# Patient Record
Sex: Female | Born: 1945
Health system: Southern US, Community
[De-identification: ages and names within clinical notes are randomized; demographics above are authoritative.]

## PROBLEM LIST (undated history)

## (undated) DIAGNOSIS — I119 Hypertensive heart disease without heart failure: Secondary | ICD-10-CM

## (undated) DIAGNOSIS — E60 Dietary zinc deficiency: Secondary | ICD-10-CM

## (undated) DIAGNOSIS — R002 Palpitations: Secondary | ICD-10-CM

## (undated) DIAGNOSIS — M1990 Unspecified osteoarthritis, unspecified site: Secondary | ICD-10-CM

## (undated) DIAGNOSIS — K5900 Constipation, unspecified: Secondary | ICD-10-CM

## (undated) DIAGNOSIS — L578 Other skin changes due to chronic exposure to nonionizing radiation: Secondary | ICD-10-CM

## (undated) DIAGNOSIS — I214 Non-ST elevation (NSTEMI) myocardial infarction: Secondary | ICD-10-CM

## (undated) DIAGNOSIS — D126 Benign neoplasm of colon, unspecified: Secondary | ICD-10-CM

## (undated) DIAGNOSIS — K219 Gastro-esophageal reflux disease without esophagitis: Secondary | ICD-10-CM

## (undated) DIAGNOSIS — E209 Hypoparathyroidism, unspecified: Secondary | ICD-10-CM

## (undated) DIAGNOSIS — E785 Hyperlipidemia, unspecified: Secondary | ICD-10-CM

## (undated) DIAGNOSIS — H8110 Benign paroxysmal vertigo, unspecified ear: Secondary | ICD-10-CM

## (undated) DIAGNOSIS — M549 Dorsalgia, unspecified: Secondary | ICD-10-CM

## (undated) DIAGNOSIS — E890 Postprocedural hypothyroidism: Secondary | ICD-10-CM

## (undated) DIAGNOSIS — L989 Disorder of the skin and subcutaneous tissue, unspecified: Secondary | ICD-10-CM

## (undated) DIAGNOSIS — K529 Noninfective gastroenteritis and colitis, unspecified: Secondary | ICD-10-CM

## (undated) DIAGNOSIS — M79621 Pain in right upper arm: Secondary | ICD-10-CM

## (undated) DIAGNOSIS — I10 Essential (primary) hypertension: Secondary | ICD-10-CM

## (undated) DIAGNOSIS — R351 Nocturia: Secondary | ICD-10-CM

## (undated) DIAGNOSIS — R42 Dizziness and giddiness: Secondary | ICD-10-CM

## (undated) DIAGNOSIS — M542 Cervicalgia: Secondary | ICD-10-CM

## (undated) DIAGNOSIS — I2510 Atherosclerotic heart disease of native coronary artery without angina pectoris: Secondary | ICD-10-CM

## (undated) DIAGNOSIS — E042 Nontoxic multinodular goiter: Secondary | ICD-10-CM

## (undated) HISTORY — DX: Noninfective gastroenteritis and colitis, unspecified: K52.9

## (undated) HISTORY — DX: Hypoparathyroidism, unspecified: E20.9

## (undated) HISTORY — DX: Gastro-esophageal reflux disease without esophagitis: K21.9

## (undated) HISTORY — DX: Dietary zinc deficiency: E60

## (undated) HISTORY — DX: Nocturia: R35.1

## (undated) HISTORY — DX: Other skin changes due to chronic exposure to nonionizing radiation: L57.8

## (undated) HISTORY — PX: CORONARY ANGIOPLASTY WITH STENT PLACEMENT: SHX49

## (undated) HISTORY — DX: Dizziness and giddiness: R42

## (undated) HISTORY — PX: POLYPECTOMY: SHX149

## (undated) HISTORY — PX: SHOULDER ARTHROSCOPY W/ ROTATOR CUFF REPAIR: SHX2400

## (undated) HISTORY — DX: Postprocedural hypothyroidism: E89.0

## (undated) HISTORY — DX: Dorsalgia, unspecified: M54.9

## (undated) HISTORY — DX: Unspecified osteoarthritis, unspecified site: M19.90

## (undated) HISTORY — DX: Cervicalgia: M54.2

## (undated) HISTORY — DX: Benign paroxysmal vertigo, unspecified ear: H81.10

## (undated) HISTORY — DX: Essential (primary) hypertension: I10

## (undated) HISTORY — DX: Hyperlipidemia, unspecified: E78.5

## (undated) HISTORY — DX: Benign neoplasm of colon, unspecified: D12.6

## (undated) HISTORY — PX: COLONOSCOPY: SHX174

## (undated) HISTORY — DX: Pain in right upper arm: M79.621

## (undated) HISTORY — DX: Disorder of the skin and subcutaneous tissue, unspecified: L98.9

## (undated) HISTORY — DX: Constipation, unspecified: K59.00

---

## 2000-11-11 ENCOUNTER — Other Ambulatory Visit: Admission: RE | Admit: 2000-11-11 | Discharge: 2000-11-11 | Payer: Self-pay | Admitting: *Deleted

## 2001-12-21 ENCOUNTER — Emergency Department (HOSPITAL_COMMUNITY): Admission: EM | Admit: 2001-12-21 | Discharge: 2001-12-21 | Payer: Self-pay | Admitting: Emergency Medicine

## 2001-12-21 ENCOUNTER — Encounter: Payer: Self-pay | Admitting: Emergency Medicine

## 2002-03-17 ENCOUNTER — Other Ambulatory Visit: Admission: RE | Admit: 2002-03-17 | Discharge: 2002-03-17 | Payer: Self-pay | Admitting: Internal Medicine

## 2002-04-08 ENCOUNTER — Ambulatory Visit (HOSPITAL_COMMUNITY): Admission: RE | Admit: 2002-04-08 | Discharge: 2002-04-08 | Payer: Self-pay | Admitting: Internal Medicine

## 2002-04-08 ENCOUNTER — Encounter: Payer: Self-pay | Admitting: Internal Medicine

## 2003-04-21 ENCOUNTER — Encounter: Payer: Self-pay | Admitting: Internal Medicine

## 2003-04-21 ENCOUNTER — Ambulatory Visit (HOSPITAL_COMMUNITY): Admission: RE | Admit: 2003-04-21 | Discharge: 2003-04-21 | Payer: Self-pay | Admitting: Internal Medicine

## 2003-09-01 ENCOUNTER — Encounter: Admission: RE | Admit: 2003-09-01 | Discharge: 2003-09-01 | Payer: Self-pay | Admitting: Internal Medicine

## 2003-09-01 ENCOUNTER — Encounter: Payer: Self-pay | Admitting: Internal Medicine

## 2004-03-06 ENCOUNTER — Observation Stay (HOSPITAL_COMMUNITY): Admission: EM | Admit: 2004-03-06 | Discharge: 2004-03-07 | Payer: Self-pay

## 2004-04-05 ENCOUNTER — Other Ambulatory Visit: Admission: RE | Admit: 2004-04-05 | Discharge: 2004-04-05 | Payer: Self-pay | Admitting: Internal Medicine

## 2005-04-24 ENCOUNTER — Ambulatory Visit: Payer: Self-pay | Admitting: Family Medicine

## 2005-04-24 ENCOUNTER — Ambulatory Visit: Payer: Self-pay | Admitting: *Deleted

## 2005-08-09 ENCOUNTER — Ambulatory Visit (HOSPITAL_COMMUNITY): Admission: RE | Admit: 2005-08-09 | Discharge: 2005-08-09 | Payer: Self-pay | Admitting: Internal Medicine

## 2005-08-30 ENCOUNTER — Ambulatory Visit: Payer: Self-pay | Admitting: Family Medicine

## 2005-09-12 ENCOUNTER — Other Ambulatory Visit: Admission: RE | Admit: 2005-09-12 | Discharge: 2005-09-12 | Payer: Self-pay | Admitting: Gynecology

## 2005-12-24 HISTORY — PX: TOTAL THYROIDECTOMY: SHX2547

## 2006-06-05 ENCOUNTER — Ambulatory Visit: Payer: Self-pay | Admitting: *Deleted

## 2006-06-06 ENCOUNTER — Ambulatory Visit: Payer: Self-pay | Admitting: *Deleted

## 2006-06-17 ENCOUNTER — Ambulatory Visit: Payer: Self-pay | Admitting: Endocrinology

## 2006-06-21 ENCOUNTER — Ambulatory Visit (HOSPITAL_COMMUNITY): Admission: RE | Admit: 2006-06-21 | Discharge: 2006-06-21 | Payer: Self-pay | Admitting: Orthopedic Surgery

## 2006-07-03 ENCOUNTER — Ambulatory Visit: Payer: Self-pay | Admitting: Endocrinology

## 2006-07-09 ENCOUNTER — Other Ambulatory Visit: Admission: RE | Admit: 2006-07-09 | Discharge: 2006-07-09 | Payer: Self-pay | Admitting: Endocrinology

## 2006-07-09 ENCOUNTER — Encounter: Payer: Self-pay | Admitting: Endocrinology

## 2006-07-09 ENCOUNTER — Ambulatory Visit: Payer: Self-pay | Admitting: Endocrinology

## 2006-07-19 ENCOUNTER — Ambulatory Visit (HOSPITAL_COMMUNITY): Admission: RE | Admit: 2006-07-19 | Discharge: 2006-07-19 | Payer: Self-pay | Admitting: Endocrinology

## 2006-07-31 ENCOUNTER — Other Ambulatory Visit: Admission: RE | Admit: 2006-07-31 | Discharge: 2006-07-31 | Payer: Self-pay | Admitting: Endocrinology

## 2006-07-31 ENCOUNTER — Ambulatory Visit: Payer: Self-pay | Admitting: Endocrinology

## 2006-08-01 ENCOUNTER — Encounter (INDEPENDENT_AMBULATORY_CARE_PROVIDER_SITE_OTHER): Payer: Self-pay | Admitting: Specialist

## 2006-08-22 ENCOUNTER — Ambulatory Visit: Payer: Self-pay | Admitting: Endocrinology

## 2006-08-30 ENCOUNTER — Ambulatory Visit: Payer: Self-pay

## 2006-09-02 ENCOUNTER — Ambulatory Visit: Payer: Self-pay | Admitting: *Deleted

## 2006-09-18 ENCOUNTER — Ambulatory Visit (HOSPITAL_COMMUNITY): Admission: RE | Admit: 2006-09-18 | Discharge: 2006-09-18 | Payer: Self-pay | Admitting: Endocrinology

## 2006-09-18 ENCOUNTER — Ambulatory Visit: Payer: Self-pay | Admitting: Endocrinology

## 2006-10-21 ENCOUNTER — Ambulatory Visit (HOSPITAL_COMMUNITY): Admission: RE | Admit: 2006-10-21 | Discharge: 2006-10-24 | Payer: Self-pay | Admitting: General Surgery

## 2006-10-21 ENCOUNTER — Encounter (INDEPENDENT_AMBULATORY_CARE_PROVIDER_SITE_OTHER): Payer: Self-pay | Admitting: *Deleted

## 2006-10-30 ENCOUNTER — Ambulatory Visit: Payer: Self-pay | Admitting: Endocrinology

## 2006-12-30 ENCOUNTER — Ambulatory Visit: Payer: Self-pay | Admitting: Endocrinology

## 2007-02-03 ENCOUNTER — Ambulatory Visit: Payer: Self-pay | Admitting: Endocrinology

## 2007-02-03 LAB — CONVERTED CEMR LAB: Calcium, Total (PTH): 8.3 mg/dL — ABNORMAL LOW (ref 8.4–10.5)

## 2007-02-14 ENCOUNTER — Ambulatory Visit: Payer: Self-pay | Admitting: Internal Medicine

## 2007-02-25 ENCOUNTER — Ambulatory Visit: Payer: Self-pay | Admitting: Endocrinology

## 2007-03-24 ENCOUNTER — Ambulatory Visit: Payer: Self-pay | Admitting: Endocrinology

## 2007-03-24 LAB — CONVERTED CEMR LAB
PTH: <2.5 pg/mL — ABNORMAL LOW (ref 14.0–72.0)
TSH: 0.15 microintl units/mL — ABNORMAL LOW (ref 0.35–5.50)

## 2007-03-27 ENCOUNTER — Ambulatory Visit: Payer: Self-pay | Admitting: Endocrinology

## 2007-05-02 ENCOUNTER — Inpatient Hospital Stay (HOSPITAL_COMMUNITY): Admission: EM | Admit: 2007-05-02 | Discharge: 2007-05-04 | Payer: Self-pay | Admitting: Emergency Medicine

## 2007-05-03 ENCOUNTER — Ambulatory Visit: Payer: Self-pay | Admitting: Internal Medicine

## 2007-05-07 ENCOUNTER — Ambulatory Visit: Payer: Self-pay | Admitting: Internal Medicine

## 2007-05-07 LAB — CONVERTED CEMR LAB
BUN: 11 mg/dL (ref 6–23)
Calcium: 8.2 mg/dL — ABNORMAL LOW (ref 8.4–10.5)
GFR calc Af Amer: 110 mL/min
GFR calc non Af Amer: 91 mL/min
Glucose, Bld: 95 mg/dL (ref 70–99)
Potassium: 3.6 meq/L (ref 3.5–5.1)

## 2007-05-08 ENCOUNTER — Ambulatory Visit: Payer: Self-pay | Admitting: Endocrinology

## 2007-06-02 ENCOUNTER — Ambulatory Visit: Payer: Self-pay | Admitting: Endocrinology

## 2007-06-02 LAB — CONVERTED CEMR LAB
Calcium, Total (PTH): 7.8 mg/dL — ABNORMAL LOW (ref 8.4–10.5)
PTH: <2.5 pg/mL — ABNORMAL LOW (ref 14.0–72.0)
Potassium: 4.6 meq/L (ref 3.5–5.1)
Potassium: 4.6 meq/L (ref 3.5–5.1)
TSH: 5.52 microintl units/mL — ABNORMAL HIGH (ref 0.35–5.50)

## 2007-06-12 ENCOUNTER — Ambulatory Visit: Payer: Self-pay | Admitting: Endocrinology

## 2007-10-20 ENCOUNTER — Encounter: Admission: RE | Admit: 2007-10-20 | Discharge: 2007-10-20 | Payer: Self-pay | Admitting: Endocrinology

## 2008-01-12 ENCOUNTER — Ambulatory Visit: Payer: Self-pay | Admitting: Internal Medicine

## 2008-01-20 ENCOUNTER — Other Ambulatory Visit: Admission: RE | Admit: 2008-01-20 | Discharge: 2008-01-20 | Payer: Self-pay | Admitting: Gynecology

## 2008-04-03 ENCOUNTER — Emergency Department (HOSPITAL_COMMUNITY): Admission: EM | Admit: 2008-04-03 | Discharge: 2008-04-03 | Payer: Self-pay | Admitting: Emergency Medicine

## 2008-04-12 ENCOUNTER — Emergency Department (HOSPITAL_COMMUNITY): Admission: EM | Admit: 2008-04-12 | Discharge: 2008-04-12 | Payer: Self-pay | Admitting: Emergency Medicine

## 2008-06-28 ENCOUNTER — Ambulatory Visit: Payer: Self-pay | Admitting: Internal Medicine

## 2008-07-12 ENCOUNTER — Ambulatory Visit (HOSPITAL_COMMUNITY): Admission: RE | Admit: 2008-07-12 | Discharge: 2008-07-12 | Payer: Self-pay | Admitting: Internal Medicine

## 2008-09-06 ENCOUNTER — Ambulatory Visit: Payer: Self-pay | Admitting: Internal Medicine

## 2008-09-06 LAB — CONVERTED CEMR LAB: AST: 18 units/L (ref 0–37)

## 2008-09-30 ENCOUNTER — Encounter: Admission: RE | Admit: 2008-09-30 | Discharge: 2008-10-27 | Payer: Self-pay | Admitting: Internal Medicine

## 2008-10-21 ENCOUNTER — Encounter: Admission: RE | Admit: 2008-10-21 | Discharge: 2008-10-21 | Payer: Self-pay | Admitting: Internal Medicine

## 2008-10-31 ENCOUNTER — Observation Stay (HOSPITAL_COMMUNITY): Admission: EM | Admit: 2008-10-31 | Discharge: 2008-11-01 | Payer: Self-pay | Admitting: Emergency Medicine

## 2008-10-31 ENCOUNTER — Ambulatory Visit: Payer: Self-pay | Admitting: Cardiology

## 2008-11-04 ENCOUNTER — Encounter: Payer: Self-pay | Admitting: Cardiology

## 2008-11-04 ENCOUNTER — Encounter: Payer: Self-pay | Admitting: Internal Medicine

## 2008-11-04 ENCOUNTER — Ambulatory Visit: Payer: Self-pay

## 2008-11-22 ENCOUNTER — Ambulatory Visit: Payer: Self-pay | Admitting: Internal Medicine

## 2009-01-20 ENCOUNTER — Other Ambulatory Visit: Admission: RE | Admit: 2009-01-20 | Discharge: 2009-01-20 | Payer: Self-pay | Admitting: Gynecology

## 2009-01-20 ENCOUNTER — Ambulatory Visit: Payer: Self-pay | Admitting: Gynecology

## 2009-01-20 ENCOUNTER — Encounter: Payer: Self-pay | Admitting: Gynecology

## 2009-03-02 ENCOUNTER — Encounter: Payer: Self-pay | Admitting: Gynecology

## 2009-03-02 ENCOUNTER — Ambulatory Visit: Payer: Self-pay | Admitting: Gynecology

## 2009-03-08 DIAGNOSIS — I2510 Atherosclerotic heart disease of native coronary artery without angina pectoris: Secondary | ICD-10-CM

## 2009-03-08 DIAGNOSIS — E785 Hyperlipidemia, unspecified: Secondary | ICD-10-CM | POA: Insufficient documentation

## 2009-03-10 ENCOUNTER — Encounter: Payer: Self-pay | Admitting: Internal Medicine

## 2009-03-10 ENCOUNTER — Ambulatory Visit: Payer: Self-pay | Admitting: Internal Medicine

## 2009-03-10 LAB — CONVERTED CEMR LAB
BUN: 20 mg/dL (ref 6–23)
CO2: 34 meq/L — ABNORMAL HIGH (ref 19–32)
Chloride: 104 meq/L (ref 96–112)
Creatinine, Ser: 1.3 mg/dL — ABNORMAL HIGH (ref 0.4–1.2)
Glucose, Bld: 98 mg/dL (ref 70–99)
Potassium: 4.3 meq/L (ref 3.5–5.1)

## 2009-03-11 ENCOUNTER — Ambulatory Visit: Payer: Self-pay | Admitting: Gynecology

## 2009-04-07 ENCOUNTER — Telehealth (INDEPENDENT_AMBULATORY_CARE_PROVIDER_SITE_OTHER): Payer: Self-pay | Admitting: *Deleted

## 2009-04-08 ENCOUNTER — Telehealth (INDEPENDENT_AMBULATORY_CARE_PROVIDER_SITE_OTHER): Payer: Self-pay | Admitting: *Deleted

## 2009-05-20 ENCOUNTER — Encounter: Payer: Self-pay | Admitting: Cardiovascular Disease

## 2009-05-20 ENCOUNTER — Ambulatory Visit: Payer: Self-pay | Admitting: Internal Medicine

## 2009-05-20 DIAGNOSIS — I10 Essential (primary) hypertension: Secondary | ICD-10-CM

## 2009-05-20 DIAGNOSIS — E039 Hypothyroidism, unspecified: Secondary | ICD-10-CM | POA: Insufficient documentation

## 2009-05-25 LAB — CONVERTED CEMR LAB
AST: 16 units/L (ref 0–37)
Cholesterol: 289 mg/dL — ABNORMAL HIGH (ref 0–200)
Direct LDL: 212.1 mg/dL
Total CHOL/HDL Ratio: 7
VLDL: 37.8 mg/dL (ref 0.0–40.0)

## 2009-10-03 ENCOUNTER — Encounter: Payer: Self-pay | Admitting: Endocrinology

## 2010-01-17 ENCOUNTER — Encounter (INDEPENDENT_AMBULATORY_CARE_PROVIDER_SITE_OTHER): Payer: Self-pay | Admitting: *Deleted

## 2010-04-17 ENCOUNTER — Ambulatory Visit: Payer: Self-pay | Admitting: Gynecology

## 2010-04-17 ENCOUNTER — Other Ambulatory Visit: Admission: RE | Admit: 2010-04-17 | Discharge: 2010-04-17 | Payer: Self-pay | Admitting: Gynecology

## 2010-04-25 ENCOUNTER — Ambulatory Visit: Payer: Self-pay | Admitting: Gynecology

## 2010-05-18 ENCOUNTER — Encounter: Payer: Self-pay | Admitting: Endocrinology

## 2010-05-31 ENCOUNTER — Ambulatory Visit: Payer: Self-pay | Admitting: Gynecology

## 2010-06-27 ENCOUNTER — Encounter: Payer: Self-pay | Admitting: Endocrinology

## 2010-10-20 ENCOUNTER — Ambulatory Visit: Payer: Self-pay | Admitting: Internal Medicine

## 2010-10-20 ENCOUNTER — Encounter: Payer: Self-pay | Admitting: Internal Medicine

## 2010-10-20 LAB — CONVERTED CEMR LAB
AST: 21 units/L (ref 0–37)
Cholesterol: 201 mg/dL — ABNORMAL HIGH (ref 0–200)
Direct LDL: 130.2 mg/dL

## 2010-12-22 ENCOUNTER — Ambulatory Visit: Payer: Self-pay | Admitting: Internal Medicine

## 2011-01-14 ENCOUNTER — Encounter: Payer: Self-pay | Admitting: Internal Medicine

## 2011-01-23 NOTE — Letter (Signed)
Summary: Appointment - Reminder 2  [employer], Main Office  [address]   [place], [place] [address]   Phone: [phone]  Fax: [phone]     January 17, 2010 MRN: [id number]   [name] [address] [place], [place]  [address]   Dear Ms. Calderone,  Our records indicate that it is time to schedule a follow-up appointment with Dr. [name]. It is very important that we reach you to schedule this appointment. We look forward to participating in your health care needs. Please contact us at the number listed above at your earliest convenience to schedule your appointment.  If you are unable to make an appointment at this time, give [place] a call so we can update our records.  Sincerely,   Migdalia Dk [facility] Scheduling Team

## 2011-01-23 NOTE — Assessment & Plan Note (Signed)
Summary: rov. gd  Medications Added LIPITOR 40 MG TABS (ATORVASTATIN CALCIUM) 1 tab every day LIPITOR 40 MG TABS (ATORVASTATIN CALCIUM) 2 tabs  every day      Allergies Added: NKDA  Visit Type:  Follow-up Primary Provider:  none   History of Present Illness: patient is a 65 year old with a history of hypertension, nonobstructive CAD and dyslipidemia.  I last saw her in the Spring of 2010.  She did not f/u with lipids after that Since seen she has done fairly well.  She notes occasional pinching in her chest that is not associated with activithy.  Lasts a very short time.  Occurs 1x per wk.  Not associated with shortness of breath.  Current Medications (verified): 1)  Calcitriol 0.25 Mcg .... 2 Pills Bid 2)  Synthroid 112 Mcg Tabs (Levothyroxine Sodium) .Marland Kitchen.. 1 Tab Once Daily 3)  Extra St Tums 750 Mg .Marland Kitchen.. 8 Per Day 4)  Aspirin 81 Mg Tbec (Aspirin) .... Take One Tablet By Mouth Daily 5)  Amlodipine Besylate 10 Mg Tabs (Amlodipine Besylate) .Marland Kitchen.. 1 Tab Once Daily  Allergies (verified): No Known Drug Allergies  Past History:  Past medical, surgical, family and social histories (including risk factors) reviewed, and no changes noted (except as noted below).  Past Medical History: Reviewed history from 03/08/2009 and no changes required. dyslipidemia nonobstructive CAD  Family History: Reviewed history from 03/08/2009 and no changes required. Mother died at 97 of old age and Parkinson's.  Father   died at 25 of an MI.  She has 9 brothers and sisters.  There is a   history of CAD and 1 brother.  There is no history of diabetes or CVA   and any of her siblings.      Social History: Reviewed history from 03/08/2009 and no changes required. She lives in [place] with a friend who is the father   of her son.  She works at [employer].  She denies tobacco, alcohol, or drug   use.  She walks 30 minutes 3 times a week.      Review of Systems       ALl systems reviewed.  Neg to the  above problem  Vital Signs:  Patient profile:   65 year old female Height:      64 inches Weight:      198 pounds BMI:     32.90 BP sitting:   122 / 72  (left arm)  Vitals Entered By: Laurance Flatten CMA (October 20, 2010 11:46 AM)  Physical Exam  Additional Exam:  patinet is in NAD HEENT:  Normocephalic, atraumatic. EOMI, PERRLA.  Neck: JVP is normal. No thyromegaly. No bruits.  Lungs: clear to auscultation. No rales no wheezes.  Heart: Regular rate and rhythm. Normal S1, S2. No S3.   No significant murmurs. PMI not displaced.  Abdomen:  Supple, nontender. Normal bowel sounds. No masses. No hepatomegaly.  Extremities:   Good distal pulses throughout. No lower extremity edema.  Musculoskeletal :moving all extremities.  Neuro:   alert and oriented x3.    EKG  Procedure date:  10/20/2010  Findings:      SInus bradycardia 59 bpm.  Impression & Recommendations:  Problem # 1:  ESSENTIAL HYPERTENSION, BENIGN (ICD-401.1) Good control.  Continue meds. Her updated medication list for this problem includes:    Aspirin 81 Mg Tbec (Aspirin) .Marland Kitchen... Take one tablet by mouth daily    Amlodipine Besylate 10 Mg Tabs (Amlodipine besylate) .Marland Kitchen... 1 tab once daily  Problem # 2:  HYPERLIPIDEMIA-MIXED (ICD-272.4) Profound in past.  Says she is taking Lipitor 40 mg.  Willl check fasting lipids and AST.  Will also check TSH.  Problem # 3:  CAD, NATIVE VESSEL (ICD-414.01) Continue meds. Her updated medication list for this problem includes:    Aspirin 81 Mg Tbec (Aspirin) .Marland Kitchen... Take one tablet by mouth daily    Amlodipine Besylate 10 Mg Tabs (Amlodipine besylate) .Marland Kitchen... 1 tab once daily  Orders: EKG w/ Interpretation (93000)  Other Orders: TLB-AST (SGOT) (84450-SGOT) TLB-Lipid Panel (80061-LIPID) TLB-TSH (Thyroid Stimulating Hormone) (02542-HCW)  Patient Instructions: 1)  Your physician recommends that you return for lab work in: lab work today we will call you with results 2)  Your  physician wants you to follow-up in:12 months   You will receive a reminder letter in the mail two months in advance. If you don't receive a letter, please call our office to schedule the follow-up appointment.   Appended Document: rov. gd Chest pain does not sound cardiac.

## 2011-01-23 NOTE — Letter (Signed)
Summary: [facility]  [facility]   Imported By: Sherian Rein 07/03/2010 13:51:55  _____________________________________________________________________  External Attachment:    Type:   Image     Comment:   External Document

## 2011-01-23 NOTE — Letter (Signed)
Summary: [facility]  [facility]   Imported By: Sherian Rein 07/06/2010 08:50:16  _____________________________________________________________________  External Attachment:    Type:   Image     Comment:   External Document

## 2011-01-30 ENCOUNTER — Other Ambulatory Visit: Payer: Self-pay | Admitting: Gynecology

## 2011-01-30 DIAGNOSIS — Z0000 Encounter for general adult medical examination without abnormal findings: Secondary | ICD-10-CM

## 2011-02-01 ENCOUNTER — Ambulatory Visit
Admission: RE | Admit: 2011-02-01 | Discharge: 2011-02-01 | Disposition: A | Discharge status: Home / Self Care | Payer: 59 | Source: Ambulatory Visit | Attending: Gynecology | Admitting: Gynecology

## 2011-02-01 DIAGNOSIS — Z0000 Encounter for general adult medical examination without abnormal findings: Secondary | ICD-10-CM

## 2011-04-23 ENCOUNTER — Other Ambulatory Visit (HOSPITAL_COMMUNITY): Payer: 59

## 2011-04-23 ENCOUNTER — Emergency Department (HOSPITAL_COMMUNITY)
Admission: EM | Admit: 2011-04-23 | Discharge: 2011-04-23 | Disposition: A | Discharge status: Home / Self Care | Payer: 59 | Attending: Emergency Medicine | Admitting: Emergency Medicine

## 2011-04-23 ENCOUNTER — Emergency Department (HOSPITAL_COMMUNITY): Payer: 59

## 2011-04-23 DIAGNOSIS — M129 Arthropathy, unspecified: Secondary | ICD-10-CM | POA: Insufficient documentation

## 2011-04-23 DIAGNOSIS — R61 Generalized hyperhidrosis: Secondary | ICD-10-CM | POA: Insufficient documentation

## 2011-04-23 DIAGNOSIS — M25569 Pain in unspecified knee: Secondary | ICD-10-CM | POA: Insufficient documentation

## 2011-04-23 DIAGNOSIS — C754 Malignant neoplasm of carotid body: Secondary | ICD-10-CM | POA: Insufficient documentation

## 2011-04-23 DIAGNOSIS — R110 Nausea: Secondary | ICD-10-CM | POA: Insufficient documentation

## 2011-04-23 DIAGNOSIS — R55 Syncope and collapse: Secondary | ICD-10-CM | POA: Insufficient documentation

## 2011-04-23 LAB — POCT I-STAT, CHEM 8
BUN: 29 mg/dL — ABNORMAL HIGH (ref 6–23)
Chloride: 103 mEq/L (ref 96–112)
Creatinine, Ser: 1.3 mg/dL — ABNORMAL HIGH (ref 0.4–1.2)
Potassium: 4 mEq/L (ref 3.5–5.1)
Sodium: 141 mEq/L (ref 135–145)

## 2011-04-23 LAB — DIFFERENTIAL
Basophils Relative: 0 % (ref 0–1)
Lymphocytes Relative: 21 % (ref 12–46)
Monocytes Absolute: 0.8 10*3/uL (ref 0.1–1.0)
Monocytes Relative: 8 % (ref 3–12)
Neutro Abs: 6.2 10*3/uL (ref 1.7–7.7)
Neutrophils Relative %: 67 % (ref 43–77)

## 2011-04-23 LAB — CBC
HCT: 36.7 % (ref 36.0–46.0)
Hemoglobin: 12.1 g/dL (ref 12.0–15.0)
MCH: 27.4 pg (ref 26.0–34.0)
MCHC: 33 g/dL (ref 30.0–36.0)
RBC: 4.42 MIL/uL (ref 3.87–5.11)

## 2011-04-23 LAB — SEDIMENTATION RATE: Sed Rate: 45 mm/hr — ABNORMAL HIGH (ref 0–22)

## 2011-04-23 MED ORDER — IOHEXOL 300 MG/ML  SOLN
5.0000 mL | Freq: Once | INTRAMUSCULAR | Status: AC | PRN
  Administered 2011-04-23: 5 mL

## 2011-04-25 ENCOUNTER — Other Ambulatory Visit (HOSPITAL_COMMUNITY)
Admission: RE | Admit: 2011-04-25 | Discharge: 2011-04-25 | Disposition: A | Discharge status: Home / Self Care | Payer: 59 | Source: Ambulatory Visit | Attending: Gynecology | Admitting: Gynecology

## 2011-04-25 ENCOUNTER — Encounter (INDEPENDENT_AMBULATORY_CARE_PROVIDER_SITE_OTHER): Payer: 59 | Admitting: Gynecology

## 2011-04-25 ENCOUNTER — Other Ambulatory Visit: Payer: Self-pay | Admitting: Gynecology

## 2011-04-25 DIAGNOSIS — Z1211 Encounter for screening for malignant neoplasm of colon: Secondary | ICD-10-CM

## 2011-04-25 DIAGNOSIS — Z01419 Encounter for gynecological examination (general) (routine) without abnormal findings: Secondary | ICD-10-CM

## 2011-04-25 DIAGNOSIS — E7800 Pure hypercholesterolemia, unspecified: Secondary | ICD-10-CM

## 2011-04-25 DIAGNOSIS — Z124 Encounter for screening for malignant neoplasm of cervix: Secondary | ICD-10-CM | POA: Insufficient documentation

## 2011-04-27 ENCOUNTER — Encounter: Payer: Self-pay | Admitting: Internal Medicine

## 2011-05-08 NOTE — Assessment & Plan Note (Signed)
[facility]                            CARDIOLOGY OFFICE NOTE   Taylor, Rice                     MRN:          [id number]  DATE:01/12/2008                            DOB:          08-Feb-1946    IDENTIFICATION:  [name] is a an 65 year old woman previously  followed by [name].  She was last seen by Dr. Corinda Rice back in 2007.   The patient has a history of profound dyslipidemia, nonobstructive CAD  (mild) by cath in February 2005; atypical chest pain, occasional  palpitations.   Since seen, she has undergone thyroidectomy complicated by  hypoparathyroidism and also postoperatively voice hoarseness.  She is  now followed at [facility].  The patient says she is taking  her medicines.  She still gets a couple times per month chest  discomfort.  It occurs usually when she is sitting.  There is no  association with activity nothing that makes the pain worse or better.  Eases on its own.  She has had for years   She works at [employer], and notes no change in her ability  to do things.   CURRENT MEDICATIONS:  1. Include Norvasc 10.  2. Aspirin 81.  3. Lipitor 20.  4. Calcitriol 1 mg daily.  5. Synthroid 125 mcg daily.  6. Hair, skin, and nail vitamin daily.  7. Tums 8 tablets daily.   PHYSICAL EXAM:  On exam the patient is in no distress.  Voice is hoarse.  Blood pressure 140/80, pulse is 61 and regular, weight 199.  Her lungs are clear.  NECK:  No JVD.  No bruits.  Cardiac exam regular rate and rhythm, S1-S2 no S3.  No significant  murmurs.  Abdomen is benign.  No masses.  No hepatomegaly.  EXTREMITIES:  Good distal pulses.  No lower extremity edema.  No bruits.   A 12-lead EKG normal sinus rhythm 61 beats per minute.   IMPRESSION:  1. Chest pain, atypical.  It could be gastrointestinal, although she      denies reflux symptoms.  I have told her to try Prilosec OTC and      see how she responds.  2.  Dyslipidemia.  Will need to get records from [facility], [address], and I will be in touch with her.  3. Palpitations.  No complaints today.  4. Hypertension.  Blood pressure on check in the office 140/80 may      have been a little higher at 153/79 on arrival.  Will need to be      followed.   I will set follow-up for 1 year, sooner if her symptoms change.  Again,  I will be in touch with her with the blood work results.     Pricilla Riffle, MD, [facility]  Electronically Signed    PVR/MedQ  DD: 01/12/2008  DT: 01/12/2008  Job #: [id number]   cc:   [facility] - [address]

## 2011-05-08 NOTE — H&P (Signed)
[name], [name] NO.:  [id number]   MEDICAL RECORD NO.:  [id number]          PATIENT TYPE:  INP   LOCATION:  3703                         FACILITY:  [facility]   PHYSICIAN:  Taylor Abed, MD, FACCDATE OF BIRTH:  04/26/1946   DATE OF ADMISSION:  10/31/2008  DATE OF DISCHARGE:                              HISTORY & PHYSICAL   PRIMARY CARDIOLOGIST:  Taylor Riffle, MD, [facility]   PRIMARY CARE [name]:  Taylor Frames, MD   THE PATIENT'S PROFILE:  A 65 year old female with prior history of chest  pain and nonobstructive CAD who presents with recurrent chest pain,  1. Chest pain/nonobstructive CAD.      a.     March 07, 2004 cardiac catheterization; EF 60%.  Left main       normal.  LAD 40% proximal.  Left circumflex normal.  RCA normal.      b.     Status post Myoview June 21, 2006, which was nonischemic.  2. Hypertension.  3. Hyperlipidemia.  4. Multinodular goiter.      a.     Status post thyroidectomy October 2007 with complicated by       postoperative hypocalcemia.  5. Hypothyroidism on Synthroid replacement.  6. Status post right shoulder surgery, June 2007.  7. History of palpitation.   HISTORY OF PRESENT ILLNESS:  A 65 year old female with prior history of  chest pain and nonobstructive CAD by cardiac catheterization 2005 who  was then in her usual state of health until this morning when she woke  at 6:30 a.m. with 5/10 sharp retrosternal chest pain without associated  symptoms or radiation.  She thought symptoms were initially worse with  upper body movement and activity such as getting dressed, but since  noted that has been present even at rest.  Symptoms are similar to the  chest pain that she have the time of her normal catheterization in 2005.  After a few hours of ongoing symptoms this morning, she presented to  [facility] in hopes of avoiding the ED; however, she was continuing to  have ongoing discomfort and they sent her to the ED for our  evaluation.  Of note, she did take two baby aspirin and a Prilosec at home without  relief.  Her ECG at [facility] showed no acute changes and currently she  appears comfortable, still complaining of forehead and then chest pain.   Notably, she experiences chest heaviness once every 3 months or so  lasting 2 hours and resolving spontaneously.  She exercises 3 times a  week without limitations in activities.  She has had chest pain  intermittently for several years.   ALLERGIES:  No known drug allergies.   HOME MEDICATIONS:  1. Aspirin 81 mg daily.  2. Norvasc 10 mg daily.  3. Lipitor 20 mg daily.  4. Calcitriol 0.25 mg 4 tablets b.i.d.  5. Synthroid 137 mcg daily.  6. Tums 8 tablets daily.   FAMILY HISTORY:  Mother died at 49 of old age and Parkinson's.  Father  died at 72 of an MI.  She has 9 brothers and sisters.  There is a  history of CAD and 1 brother.  There is no history of diabetes or CVA  and any of her siblings.   SOCIAL HISTORY:  She lives in [place] with a friend who is the father  of her son.  She works at [employer].  She denies tobacco, alcohol, or drug  use.  She walks 30 minutes 3 times a week.   REVIEW OF SYSTEMS:  Positive for occasional chills.  She has had chest  pain as outlined in the HPI.  Otherwise, all systems reviewed and  negative.   PHYSICAL EXAM:  VITAL SIGNS:  Heart rate 70, respirations 16, blood  pressure is 160/80, weight is 195 pounds.  GENERAL:  She is afebrile, pleasant female in no acute distress.  Awake,  alert, and oriented x3.  HEENT:  Normal.  NEUROLOGIC:  Grossly intact and nonfocal.  SKIN:  Warm and dry without lesions or masses.  MUSCULOSKELETAL:  Without obvious deformity or effusions.  NECK:  No bruits, JVD, or lymphadenopathy.  LUNGS:  Respirations regular and unlabored.  Clear to auscultation.  CARDIAC:  Regular S1 and S2.  No S3, S4 murmurs.  ABDOMEN:  Round, soft, nontender, and nondistended.  Bowel sounds  present x4.  No  reproducible chest discomfort.  EXTREMITIES:  No clubbing, cyanosis, edema, or petechiae.  Dorsalis  pedis and posterior tibial pulse are 2+ and equal bilaterally.   Chest x-ray is pending.  EKG shows sinus rhythm.  Normal axis, rate at  68 beats per minute.  No acute ST-T changes.  Lab work is pending.   ASSESSMENT AND PLAN:  1. Chest pain.  This is sharp and similar to the pain that she      experienced in 2005 at the time of her normal catheterization.  She      still complains of 4/10 sharp pain and this was not reproducible.      She does appear comfortable.  We plan to admit in cycle cardiac      markers.  Continue aspirin, statin and add heparin.  Provided that      she rules out, we would plan to discontinue in the a.m. with      probable outpatient Myoview and follow up with Dr. [name].  If her      markers are abnormal, we would plan catheterization.  2. Hypertension.  Blood pressure is currently elevated here in the ED.      She did not take her Norvasc this morning.  3. Hyperlipidemia.  Check lipids and LFTs.  Continue statin therapy.  4. Hypothyroidism.  Continue Synthroid.  5. Hypocalcemia.  Continue calcitriol and Tums.      [name], ANP      Taylor Abed, MD, [facility]  Electronically Signed    CB/MEDQ  D:  10/31/2008  T:  10/31/2008  Job:  [id number]

## 2011-05-08 NOTE — Assessment & Plan Note (Signed)
[facility]                            CARDIOLOGY OFFICE NOTE   [name], [name]                     MRN:          [id number]  DATE:11/22/2008                            DOB:          1946/08/07    IDENTIFICATION:  [name] is a 65 year old woman.  She was last seen  in cardiology clinic back in July.  Note, the patient was admitted to  [facility] in early November for chest pain, ruled out for myocardial  infarction.  She just had a Myoview study that was done on November 04, 2008, that showed normal perfusion.   On talking to [name], she still has episodes of left parasternal  chest pain, stabbing in nature, occur without any particular activity.  She denies any association with food.   She still is being troubled by her vocal cord problems and she has seen  Dr. Pollyann Kennedy in ENT who suggest possible surgery.   CURRENT MEDICATIONS:  1. Norvasc 10.  2. Aspirin 81.  3. Lipitor 20.  4. Calcitriol 0.25 four b.i.d.  5. Synthroid 137 mcg.  6. Tums.  7. Multivitamin.   REVIEW OF SYSTEMS:  The patient notes achiness in her legs which she  attributes to her calcitriol.  She does not think it is the Lipitor.   PHYSICAL EXAMINATION:  GENERAL:  The patient is in no distress.  VITAL SIGNS:  Blood pressure 124/60, pulse 64, weight 196.  LUNGS:  Clear without rales or wheezes.  CARDIAC:  Regular rate and rhythm.  S1, S2.  No S3, no murmurs.  ABDOMEN:  Benign.  EXTREMITIES:  No edema.   IMPRESSION:  1. Chest pain.  I am not convinced that the patient's chest pain is      angina.  May be more musculoskeletal.  I would keep her on what she      is now.  Again, she has a history of very minimal coronary artery      disease by cath in 2005, normal Myoview now.  2. Hypertension, adequate control.  3. Dyslipidemia on Lipitor.  Lipids will need to be followed.  Note,      her last panel was in September.  Her LDL at that time was 142.  Again, we will need to make sure she has been taking.  4. ENT.  The patient followed by Dr. Pollyann Kennedy.  I have sent him a copy of      the swallowing study that she had done.  The patient discussed      possible surgery.  From a cardiac standpoint, I think she is at low      risk and okay to proceed if this is decided on without any further      testing.   I will set to see the patient back later in the spring, sooner if  problems develop.     Pricilla Riffle, MD, [facility]  Electronically Signed    PVR/MedQ  DD: 11/22/2008  DT: 11/23/2008  Job #: [id number]

## 2011-05-08 NOTE — Consult Note (Signed)
[facility]                          ENDOCRINOLOGY CONSULTATION   CHARLINE, HOSKINSON                     MRN:          [id number]  DATE:05/08/2007                            DOB:          10/02/1946    REASON FOR VISIT:  Follow up hypocalcemia.   HISTORY OF THE PRESENT ILLNESS:  [age]-year-old woman who was recently  in the hospital for several days for hypocalcemia, despite the fact that  her Rocaltrol was increased to 0.5 mcg a day last month.  In the  hospital, she was advised to increase this to 2 times 0.5 mcg a day, but  she did not do so.  She states she is feeling much better in general.  She still has some numbness and cramping, but states that these symptoms  are much better.   Regarding her hypothyroidism, she did not decrease her Synthroid to 100  mcg a day as advised and instead takes a 125-mcg tablet when she feels  tired.   She has seen Dr. Pollyann Kennedy for a paralyzed right vocal cord and she states  she is still hoarse.  It seems that she did not make a followup  appointment as he advised.   PAST MEDICAL HISTORY:  1. Dyslipidemia.  2. Hyperglycemia.  3. Eczema.  4. Diffuse osteoarthritis.   REVIEW OF SYSTEMS:  She has some weight gain recently, but she denies  fever, syncope, diarrhea, nausea, vomiting and itching.   PHYSICAL EXAMINATION:  Blood pressure 134/72, heart rate 77, temperature  is 97.5.  The weight is 198.  GENERAL:  No distress.  MUSCULOSKELETAL:  On both upper extremities, muscle bulk and strength  are normal.   LABORATORY STUDIES:  On May 07, 2007, calcium was 8.2.  On May 04, 2007,  calcium was 7.3; it was 5.9 on May 02, 2007.  TSH on May 02, 2007 is 2.6.   IMPRESSION:  1. Postoperative hypoparathyroidism, non-adherent with therapy.  2. Postoperative hypothyroidism, non-adherent with therapy.  3. Persistent hoarseness due to a paralyzed right vocal cord.   PLAN:  1. I advised her the risk of non-adherence  and I again advised her of      the risk of hypocalcemia.  I have increased her Rocaltrol to 3      times 0.25 mcg a day.  2. I told her to take Synthroid only at 100 mcg a day.  3. Refer back to Dr. Pollyann Kennedy to follow up your hoarseness.  4. Return in 30 days with laboratory studies prior.     Sean A. Everardo All, MD  Electronically Signed    SAE/MedQ  DD: 05/11/2007  DT: 05/12/2007  Job #: [id number]   cc:   Jeannett Senior. Pollyann Kennedy, MD  Adolph Pollack, M.D.

## 2011-05-08 NOTE — Assessment & Plan Note (Signed)
[facility]                            CARDIOLOGY OFFICE NOTE   [name], [name]                     MRN:          [id number]  DATE:06/28/2008                            DOB:          [birth year]    IDENTIFICATION:  [name] is a 65 year old woman.  She has a history  of nonobstructive CAD, dyslipidemia, chest pain, and occasional  palpitations.  She was previously followed by Dr. Graceann Congress.  I saw  her in January.   Since seen, she has been doing okay.  She walks a couple times per week[place].  She notes occasional episodes of  chest pain that is going to the left shoulder, not associated with  activity, and lasts about 15 seconds.  Never has any pain with activity.  Occasional shortness of breath, again without activity.   CURRENT MEDICATIONS:  1. Norvasc 10.  2. Aspirin 81.  3. Lipitor 20.  4. Calcitriol 0.25 four daily.  5. Synthroid 125.  6. Tums 8 tablets daily.  7. Lutein.  8. Magnesium.   REVIEW OF SYSTEMS:  Has had difficulty swallowing since her thyroid  surgery.  Things seem to get stuck.   PHYSICAL EXAMINATION:  VITAL SIGNS:  Blood pressure 135/76, pulse is 70  and regular, and weight 197.  LUNGS:  Clear without wheezes.  CARDIAC:  Regular rate and rhythm.  S1 and S2.  No S3.  No significant  murmurs.  NECK:  Some soft tissue swelling near her incision.  ABDOMEN:  Benign.  EXTREMITIES:  No edema.   IMPRESSION:  1. Coronary artery disease. Very minimal on catheterization in 2005.      Symptoms of intermittent chest pain are not due to ischemia.  Would      continue on risk factor modification.  2. Hypertension, adequate control.  3. Difficulty swallowing.  We will set up for a modified barium      swallow.  4. Dyslipidemia.  We will need to get fasting lipids.  We will also      check a thyroid.     Pricilla Riffle, MD, [facility]  Electronically Signed    PVR/MedQ  DD:  06/28/2008  DT: 06/29/2008  Job #: [id number]   cc:   Willow Ora, MD

## 2011-05-08 NOTE — Letter (Signed)
June 12, 2007    [name]  [address]  [place], [place] [address]   RE:  [name], [name]  MRN:  [id number]  /  DOB:  February 20, 1946   Dear [name],   This letter is to follow up on our conversation during your visit here  in the office this morning.  Our records indicate that you have been  chronically dissatisfied with physicians here at [facility], as well as at  several other practices.  For this reason, by this letter, you are  dismissed as a patient of [facility].   You are advised to make and keep an appointment with a new medical  doctor.  Because you have ongoing healthcare needs, you should do this  as soon as possible.  [facility] can assist you in finding  a new physician, if necessary.   This dismissal becomes effective 30 days from the date of your receipt  of this letter.  During that time, you will receive only emergency  services from [place].  After that date, you will receive no services from  [place].   Our medical records department will be happy to supply records to your  new physician upon your written request.  There may be a charge for this  service.   With best wishes for your future health,    Sincerely,      Sean A. Everardo All, MD  Electronically Signed    SAE/MedQ  DD: 06/12/2007  DT: 06/13/2007  Job #: [id number]

## 2011-05-08 NOTE — Discharge Summary (Signed)
NAMEDIVYA, MUNSHI              ACCOUNT NO.:  [id number]   MEDICAL RECORD NO.:  [id number]          PATIENT TYPE:  INP   LOCATION:  3703                         FACILITY:  [facility]   PHYSICIAN:  Pricilla Riffle, MD, FACCDATE OF BIRTH:  [birth year]   DATE OF ADMISSION:  10/31/2008  DATE OF DISCHARGE:  11/01/2008                               DISCHARGE SUMMARY   PROCEDURES:  None.   PRIMARY AND FINAL DISCHARGE DIAGNOSIS:  Chest pain.   SECONDARY DIAGNOSES:  1. History of chest pain with catheterization in 2005 showing      nonobstructive coronary artery disease.  2. Hypertension.  3. Hyperlipidemia.  4. Obesity.  5. Multinodular thyroid goiter with total thyroidectomy in 2007.  6. History of postoperative hypocalcemia.  7. History of rotator cuff surgery.  8. History of palpitations.   TIME OF DISCHARGE:  37 minutes.   HOSPITAL COURSE:  Ms. Bonifas is a 65 year old female with a history of  nonobstructive coronary artery disease 4 years ago.  She was awakened by  chest pain on the day of admission and came to the hospital.  She was  admitted for further evaluation and treatment.   Her initial cardiac enzymes were negative for MI.  Her TSH was low  normal at 0.353 and she is to follow up with Dr. Talmage Nap for this.  Other  labs showed a potassium that was slightly low at 3.2 but this was  supplemented.  Her potassium at discharge was 3.8.  Other labs were  within normal limits.   On November 01, 2008, Ms. Rynders was evaluated by Dr. [name].  Her chest  pain had resolved.  Her third set of enzymes is pending but if these are  negative, she is considered stable for discharge with close outpatient  followup and stress test.   DISCHARGE INSTRUCTIONS:  1. Her activity level is to be increased gradually.  2. She is to stick to a low-fat heart-healthy diet.  3. She is to get a stress test on November 04, 2008, at noon with no      food or drink the midnight before and clear liquids up to  4 hours      before the test.  Meds were okay with water.  No caffeine.  4. She is to follow up with Dr. Dietrich Pates on November 22, 2008 at      12:30 and with Dr. Talmage Nap as needed.   DISCHARGE MEDICATIONS:  1. Norvasc 10 mg daily.  2. Lipitor 20 mg a day.  3. Calcitriol 0.25 mcg 2 tablets b.i.d.  4. Synthroid 137 mcg daily.  5. Tums 8 tablets daily.  6. Aspirin 325 mg 2 tablets daily.  7. Multivitamin daily.  8. Fish oil 2 tablets daily.      Theodore Demark, PA-C      Pricilla Riffle, MD, [facility]  Electronically Signed    RB/MEDQ  D:  11/01/2008  T:  11/01/2008  Job:  [id number]   cc:   Dorisann Frames, M.D.

## 2011-05-08 NOTE — H&P (Signed)
[name], VARY NO.:  [id number]   MEDICAL RECORD NO.:  [id number]          PATIENT TYPE:  INP   LOCATION:  5731                         FACILITY:  [facility]   PHYSICIAN:  Christell Faith, MD   DATE OF BIRTH:  Dec 14, 1946   DATE OF ADMISSION:  05/02/2007  DATE OF DISCHARGE:                              HISTORY & PHYSICAL   PRIMARY CARE PHYSICIAN:  Dr. Revonda Standard with [facility].   CHIEF COMPLAINT:  Weakness and numbness.   HISTORY OF PRESENT ILLNESS:  This is a 65 year old [place] female who  has been on high doses of oral calcium supplementation ever since having  a total thyroidectomy in October 2007.  At the time, she was noted to  have significant postoperative hypoparathyroidism and was treated by Dr.  Abbey Chatters with Os-cal 500 mg 3 tablets 4 times a day.  She was  maintained on this dose of calcium supplementation until approximately a  month ago when her dose was decreased down to 2 tablets a day.  For  about the past 4 days she has had progressive numbness in her bilateral  upper and lower extremities.  Today she also felt numbness and tingling  around her mouth and in the tongue, this concerned her enough to present  to the emergency department.  She feels weak, but does not have any  unilateral weakness, facial droop, garbled speech, trouble swallowing,  or gait disturbances.  She has been able to work.  She is not feeling  clumsy and she is not dropping things.  No seizure activity.   REVIEW OF SYSTEMS:  As per the HPI.  In addition, no fever, chills, or  sweats.  No nausea, vomiting, or diarrhea.  No headache.  She does have  a hoarse voice since thyroid surgery.  No abdominal pain.  No chest  pain.  No orthopnea or PND.  No edema.  No rash.   PAST MEDICAL HISTORY:  1. Hypertension.  2. Hyperlipidemia.  3. Multinodular thyroid goiter status post total thyroidectomy in      October 2007.  Pathology was negative for cancer and positive  for 1      parathyroid gland.  It is felt that she still has 3 parathyroid      glands remaining.  4. Significant postoperative hypocalcemia October 2007.  5. Rotator cuff surgery.   FAMILY HISTORY:  Negative for thyroid problems, positive for strokes.  Her son's name is Yousuf and accompanies her today.   SOCIAL HISTORY:  She lives with a friend in [place].  She works at  [employer].  Denies tobacco or alcohol.   ALLERGIES:  NONE.   MEDICATIONS:  1. Norvasc unknown dose.  2. Os-cal 500 with vitamin D 2 tablets a day.  She was recently      started on another kind of vitamin D, which she thinks is      calcitriol, unknown dose.  3. Synthroid 125 mcg p.o. daily.   PHYSICAL EXAMINATION:  VITAL SIGNS:  Temperature 97.7, pulse 80,  respiratory rate 18, blood pressure 157/75, saturation 94% on room  air.  GENERAL:  This is a pleasant female in no distress.  Affect is somewhat  flattened with the appearance of depression.  HEENT:  There is no facial swelling.  Pupils are round and reactive.  Sclerae are clear.  NECK:  Supple.  There is mild bilateral swelling at the thyroidectomy  site.  The scar is well-healed.  The are is nontender.  There is no  bruits.  CHEST WALL:  Normal excursion.  CARDIAC EXAM:  Regular rate and rhythm.  No murmurs, no gallops.  ABDOMEN:  Soft, nontender, nondistended.  Normal bowel sounds.  LUNGS:  Clear to auscultation bilaterally without wheezing or rales.  EXTREMITIES:  No edema.  No clubbing or cyanosis, 2+ dorsalis pedis and  radial pulses bilaterally.  SKIN:  No rash.  NEUROLOGIC EXAM:  Decreased sensation bilaterally in the upper and lower  extremities and in the face.  Tongue is midline.  Extraocular movements  are intact.  Shoulder shrug is normal.  There are no strength deficits  in the upper and lower extremities.  Mildly hyperreflexic with a  negative Trousseau but positive Chvostek sign.   DIAGNOSTIC TEST:  Noncontrast head CT  negative for acute intracranial  pathology.  Electrocardiogram sinus rhythm with a rate of 62 and a  corrected QT interval of 498.  Calcium 5.9, albumin 3.5, total protein  6.9, sodium 142, potassium 3.5, BUN 13, creatinine 1, glucose 97.  White  count 9.5, hemoglobin 12.8, platelets 277.  Alkaline phosphatase 77,  total bilirubin 0.6, AST 29, ALT 25, venous pH 7.39.   IMPRESSION:  A 65 year old female with symptomatic hypocalcemia.  Hypocalcemia has been a problem since her thyroidectomy in October 2007.  Her calcium supplementation was recently decreased significantly, which  has most likely precipitated this presentation.   PLAN:  1. We will complete her laboratory workup by ordering PTH, 1 25      vitamin D, 25 vitamin D, ionized calcium, phosphorus, magnesium,      and TSH levels tonight.  2. We will give IV calcium gluconate overnight until the calcium level      is 8.  3. We will supplement magnesium to 2 and potassium to 4.  4. We will place her back on Os-cal 500 mg 3 tablets 4 times a day,      which was apparently her previous dose.  5. We will tailor her vitamin D therapy pending laboratory results.      For the time being we will use vitamin D at least 800 to 1,200      units a day.  6. Continue Synthroid 125 mcg daily.  7. We will perform serial neurologic exams, but suspect paresthesias      and decrease sensation are all due solely to hypocalcemia.  8. We will watch the QT interval closely.  It should correct with      supplementation of electrolytes.  9. She should probably discontinue her calcium channel blocker      indefinitely.  A different blood pressure agent      can be initiated prior to discharge.  10.We will recheck labs in the morning including calcium, magnesium.  11.Tentative stay is 2 days.      Christell Faith, MD  Electronically Signed     NDL/MEDQ  D:  05/02/2007  T:  05/03/2007  Job:  [id number]

## 2011-05-08 NOTE — Consult Note (Signed)
[facility]                          ENDOCRINOLOGY CONSULTATION   ROSHELL, BRIGHAM                     MRN:          [id number]  DATE:06/12/2007                            DOB:          12-22-1946    REASON FOR VISIT:  Followup thyroid and parathyroid.   HISTORY OF PRESENT ILLNESS:  A 65 year old woman who has postoperative  hypoparathyroidism.  When I saw her a month ago, her Rocaltrol was  increased to 2x 0.5 mcg a day.  However when she in on that day, she was  only taking one of those tablets.  When I saw her, she was advised to  increase her Rocaltrol to 3x 0.25 mcg a day.  However, she is only  taking 2x 0.25 mcg a day.   Regarding her hypothyroidism, she takes Synthroid 100 mcg a day.   Regarding her dyslipidemia, she is not taking her prescriptions as  advised.   She states that she continues to have symptoms of hoarseness and  difficulty clearing her throat.  She went back to see Dr. Pollyann Kennedy, and she  states that he told her that she was fine.  She states she came away  very dissatisfied with this conclusion.   PAST MEDICAL HISTORY:  1. Hyperglycemia.  2. Eczema.  3. Diffuse osteoarthritis.   REVIEW OF SYSTEMS:  Denies muscle cramps and she denies any change in  her weight.   PHYSICAL EXAMINATION:  Blood pressure 138/80, heart rate is 64,  temperature is 97.1, the weight is 196.  GENERAL:  She is anxious.  NECK:  I do not appreciate a nodule in the neck.  She has a healed  surgical scar.   LABORATORY DATA:  On 06/02/2007, parathyroid hormone is undetectable.  Calcium 7.8, potassium 4.6.  TSH 5.52, cholesterol 339, LDL cholesterol  241.   IMPRESSION:  1. Nonadherent with advice regarding treatment of her      hypoparathyroidism.  2. Hypothyroidism.  She needs an increase in her Synthroid.  3. Nonadherent with advice regarding her dyslipidemia.   PLAN:  1. Increase Rocaltrol to 3x 0.25 mcg a day.  2. Increase Synthroid to  125 mcg a day.  3. I have advised her to take her Zetia and Lipitor as prescribed.  4. I had a discussion with her about the fact that she is dissatisfied      with basically all of the physicians that she has seen.  This      includes Dr. Drue Novel, the first physician she saw at [facility], and now she states she is dissatisfied with Dr. Pollyann Kennedy and      me.  I discussed with her the fact that we cannot continue to      provide services for her given her ongoing nonadherence and pattern      of generalized dissatisfaction.  Her response is that she is in      fact being mistreated by all of these physicians.  As my efforts to      help her gain some insight have failed, I have told her  that I have      no choice but to begin the process of discharging her from the      practice.     Sean A. Everardo All, MD  Electronically Signed    SAE/MedQ  DD: 06/12/2007  DT: 06/12/2007  Job #: [id number]   cc:   Willow Ora, MD  Adolph Pollack, M.D.  Jefry H. Pollyann Kennedy, MD

## 2011-05-08 NOTE — Discharge Summary (Signed)
[name], [name]              ACCOUNT NO.:  [id number]   MEDICAL RECORD NO.:  [id number]          PATIENT TYPE:  INP   LOCATION:  5731                         FACILITY:  [facility]   PHYSICIAN:  Georgina Quint. Plotnikov, MDDATE OF BIRTH:  [birth year]   DATE OF ADMISSION:  05/02/2007  DATE OF DISCHARGE:  05/04/2007                               DISCHARGE SUMMARY   DISCHARGE DIAGNOSES:  1. Symptomatic hypocalcemia related to problem #2.  2. Postoperative hypoparathyroidism.  3. Hypokalemia corrected.  4. Weakness and numbness related to problem #1 resolved.  5. Hypothyroidism.  6. Hypertension.   DISCHARGE MEDICATIONS:  1. Rocaltrol 0.5 mcg 2 tablets once a day.  2. TUMS 2 tablets four times a day.  3. Synthroid 125 mcg a day.  4. Norvasc restart home doses.   SPECIAL INSTRUCTIONS:  Go to [facility] for lab work on Wednesday or  Tuesday next week.   FOLLOW-UP PLANS:  Dr. Everardo All next week.  Return to work after you  consult with Dr. Everardo All.   ACTIVITY:  Increase slowly.  Special instructions:  Call if problems.   HISTORY:  The patient is a 65 year old female who was admitted on May 02, 2007 with weakness and numbness.  She was found to have a calcium level  of 5.9, potassium 3.5.  For the details, please address to Dr. Lovena Neighbours  H&P.  Her vitamin D and calcium was reduced a month ago down to 2  tablets of Os-Cal daily.   HOSPITAL COURSE:  During the course of hospitalization, she received IV  fluids, IV and oral calcium.  Also received potassium.  She was put on  Rocaltrol.  Her condition has gradually improved.  On the day of  discharge, she is feeling well.  She has no complaint.   PHYSICAL EXAMINATION:  VITAL SIGNS:  Blood pressure 122/62, heart rate  60, respirations 18, and temperature 98.8.  Sats 96% on room air.  GENERAL:  She is in no acute distress.  HEENT:  Moist mucosa.  NECK:  Supple.  Neck without masses.  Lungs:  Clear.  Heart:  Regular S1, S2.  ABDOMEN:   Soft, nontender.  LOWER EXTREMITIES:  Without edema.  NEURO:  She is alert, oriented, and cooperative.  Neuro exam is  nonfocal.   Her lab work today reveals sodium of 142, creatinine 0.75, potassium  4.0, glucose 109, BUN 11, and calcium 7.3.  Lab work, head CT no  abnormalities.  EKG with normal sinus rhythm and urinalysis normal.  Parathyroid hormone pending.  Vitamin D pending.  Albumin 3.2 - 3.5,  magnesium 2.0, phosphorus 6.4 - 5.3.  Liver tests normal.  Bilirubin  normal.  Calcium 5.9 on admission and 6.7 on discharge.  Potassium 3.4 -  4.0.      Georgina Quint. Plotnikov, MD  Electronically Signed     AVP/MEDQ  D:  05/04/2007  T:  05/05/2007  Job:  [id number]   cc:   Adolph Pollack, M.D.  Sean A. Everardo All, MD

## 2011-05-10 ENCOUNTER — Telehealth: Payer: Self-pay | Admitting: *Deleted

## 2011-05-10 NOTE — Telephone Encounter (Signed)
Called patient concerning the lab work Dr.Ross received from Dr.Fernandez dated 04/25/2011. Her LDL was 180 and total cholesterol was 247. She states that she missed several doses of Lipitor 40mg  because of taking medication for a back problem.  She will have repeat fasting lab work done at the [facility] on 7/18.

## 2011-05-11 NOTE — Consult Note (Signed)
[name]                            ENDOCRINOLOGY CONSULTATION   [name], [name]                     MRN:          [id number]  DATE:10/30/2006                            DOB:          1946/03/14    REASON FOR VISIT:  Follow up recent thyroid surgery.   HISTORY OF PRESENT ILLNESS:  A 65 year old woman who is now a week or two  postop for multinodular goiter.  She feels well except for some hoarseness.   Regarding her postoperative hypothyroidism, she has been prescribed 125 mcg  a day of Synthroid.   She has chronic insomnia, for which she states Ambien helps, and she wants  to continue taking this.   She has chronic intermittent skin rash of her hands.   PAST MEDICAL HISTORY:  1. Dyslipidemia.  2. Hyperglycemia.   REVIEW OF SYSTEMS:  Denies any change in weight.  She denies anxiety and  depression.   PHYSICAL EXAMINATION:  VITAL SIGNS:  Blood pressure 131/72, heart rate 77,  temperature 97.3.  The weight is 194.  GENERAL:  In no distress.  NECK:  She has a healing surgical scar.  SKIN:  Mild eczema on the extensor surfaces of the hands.   LABORATORY STUDIES:  I reviewed the pathology report with the patient.   IMPRESSION:  1. Slight hoarseness after surgery, which is usually self-limited.  2. Postoperative hypothyroidism.  3. Insomnia.  4. Eczema.   PLAN:  1. I told the patient that this postoperative hoarseness is usually      transient and self-limited.  2. Continue Synthroid 125 mcg a day.  3. Return in about 30 days.  4. Continue Ambien 10 mg nightly as needed for sleep.  5. Lidex cream t.i.d. as needed for eczema.    ______________________________  Cleophas Dunker Everardo All, MD    SAE/MedQ  DD: 10/31/2006  DT: 10/31/2006  Job #: [id number]   cc:   Adolph Pollack, M.D.

## 2011-05-11 NOTE — H&P (Signed)
[name], [name]              ACCOUNT NO.:  [id number]   MEDICAL RECORD NO.:  [id number]          PATIENT TYPE:  OIB   LOCATION:  5729                         FACILITY:  [facility]   PHYSICIAN:  Adolph Pollack, M.D.DATE OF BIRTH:  06-May-1946   DATE OF ADMISSION:  10/21/2006  DATE OF DISCHARGE:                                HISTORY & PHYSICAL   REASON FOR ADMISSION:  Thyroidectomy.   HISTORY OF PRESENT ILLNESS:  This is a 65 year old female who has a  multinodular goiter.  She was found to have multiple thyroid nodules after  undergoing evaluation for a right shoulder injury.  She subsequently  underwent fine-needle aspiration of a nodule in the left lobe which showed  atypical cells consistent possible follicular neoplasm.  She was seen in the  office by Dr. Luisa Hart and by myself.  She had a fairly diffuse goiter.  After discussion with her, we talked about total thyroidectomy which she  agreed to.  This procedure and the risks were discussed preoperatively.   PAST MEDICAL HISTORY:  1. Hypertension.  2. Hypercholesterolemia.  3. Rotator cuff injury.   PAST SURGICAL HISTORY:  Repair of rotator cuff injury.   ALLERGIES:  NONE.   MEDICATIONS:  Include Norvasc, Lipitor, aspirin, Zetia.   SOCIAL HISTORY:  No tobacco or alcohol use.   FAMILY HISTORY:  Notable for strokes.   PHYSICAL EXAMINATION:  GENERAL:  A moderately obese female in no acute  distress.  She is very pleasant and cooperative.  VITAL SIGNS:  Temperature is 97 degrees, blood pressure is 137/65, pulse 69.  HEENT:  Eyes:  Extraocular motions intact.  No icterus.  NECK:  Demonstrates a diffuse fullness inferiorly that it is mobile with a  swallow.  No obvious adenopathy.  RESPIRATORY:  Breath sounds equal and clear.  Respirations unlabored.  CARDIOVASCULAR:  Demonstrates a regular rate and regular rhythm.  No murmur.  ABDOMEN:  Soft, nontender.  EXTREMITIES:  Has SCD hose on.   IMPRESSION:  Bilateral thyroid  nodules/multinodular goiter with one of the  lesions on the left side showing atypical cells consistent with possible  follicular neoplasm.   PLAN:  Total thyroidectomy.      Adolph Pollack, M.D.  Electronically Signed     TJR/MEDQ  D:  10/21/2006  T:  10/22/2006  Job:  [id number]

## 2011-05-11 NOTE — Cardiovascular Report (Signed)
[name], [name]                          ACCOUNT NO.:  [id number]   MEDICAL RECORD NO.:  [id number]                   PATIENT TYPE:  INP   LOCATION:  6527                                 FACILITY:  [facility]   PHYSICIAN:  Charlies Constable, M.D. [facility]              DATE OF BIRTH:  1946/02/11   DATE OF PROCEDURE:  DATE OF DISCHARGE:  03/07/2004                              CARDIAC CATHETERIZATION   CLINICAL HISTORY:  [name] is 65 years old and has had a previous  history of chest pain and has been evaluated with Cardiolite scan which was  negative.  She was readmitted with the current chest pain and because of  positive risk factors and continued symptoms, the decision was made to  evaluate with angiography.   PROCEDURE:  The procedure was performed via the right femoral artery using  arterial sheath and 6 French preformed coronary catheters. A front wall  arterial puncture was performed and Omnipaque contrast was used.  A distal  aortogram was performed to rule out abdominal aortic aneurysm.  The patient  tolerated the procedure well and left the laboratory in satisfactory  condition.   RESULTS:  Left main coronary artery:  The left main coronary was free of  significant disease.   Left anterior descending artery:  The left anterior descending artery had a  40% proximal stenosis.  The LAD gave rise to three septal perforators and  diagonal branch.  These and the rest of the LAD were free of significant  disease.   Circumflex artery:  The circumflex artery gave rise to two marginal branches  and two posterior lateral branches.  These vessels were free of significant  disease.   Right coronary artery:  The right coronary artery was a small to moderate  size vessel.  It gave rise to a large right ventricular branch, posterior  descending branch and a small posterior lateral branch.  There were  irregularities in the right coronary artery, but no significant obstruction.   LEFT  VENTRICULOGRAM:  The left ventriculogram performed in the RAO  projection showed good wall motion with no areas of hypokinesis.  The  estimated ejection fraction was 60%.   DISTAL AORTOGRAM:  Distal aortogram was performed which showed patent renal  arteries and no significant aortoiliac obstruction.   HEMODYNAMIC DATA:  The aortic pressure was 133/67, mean of 96.  Left  ventricular pressure was 133/11.   CONCLUSIONS:  Nonobstructive coronary artery disease with 40% narrowing in  the proximal left anterior descending.  No obstruction in the circumflex  artery, minor irregularities in the right coronary artery and normal left  ventricular function.   RECOMMENDATIONS:  Reassurance.  The etiology of the patient's symptoms are  not clear.  Will give her a trial of proton pump inhibitors and arrange for  her to be discharged later today with follow up with Dr. Corinda Gubler.  Charlies Constable, M.D. [facility]    BB/MEDQ  D:  03/07/2004  T:  03/08/2004  Job:  [id number]   cc:   Cecil Cranker, M.D.   Wanda Plump, MD [facility]  [phone] [place], [place] [address]

## 2011-05-11 NOTE — Consult Note (Signed)
[facility]                          ENDOCRINOLOGY CONSULTATION   NORLENE, LANES                     MRN:          [id number]  DATE:02/03/2007                            DOB:          Oct 25, 1946    February 04, 2007   REASON FOR VISIT:  Follow-up thyroid.   HISTORY OF PRESENT ILLNESS:  A 65 year old woman who recently had a  thyroidectomy for suspicious nodules.  No malignancy was found.  She  states she is feeling well except that she continues to have a sensation  of fullness in her neck.   Regarding her postoperative hypothyroidism, she currently takes  Synthroid 150 mcg a day.  She also had mild postoperative hypocalcemia  for which she takes calcium tablets 18 a day and she states that these  also contain vitamin D.   PAST MEDICAL HISTORY:  1. Dyslipidemia.  2. Hyperglycemia.  3. Hypertension.   REVIEW OF SYSTEMS:  Denies trouble swallowing or breathing.   PHYSICAL EXAMINATION:  GENERAL APPEARANCE:  No distress.  VITAL SIGNS:  Blood pressure 122/72, heart rate 63, temperature 97.1,  weight 194.  NECK:  She has a healed surgical scar.  No thyroid tissue is palpable.  There is no lymphadenopathy at the neck nor at the supraclavicular area.   LABORATORY DATA:  On February 03, 2007, TSH 0.06.   IMPRESSION:  1. Status post surgery for suspicious thyroid nodules.  Benign      findings on pathology.  2. Postoperative hypothyroidism for which she needs a reduction in her      Synthroid.  3. Postoperative hypocalcemia which is usually transient.   PLAN:  1. Reduce Synthroid to 125 mcg a day.  2. Check parathyroid hormone level.  3. Return in two weeks.  4. Check CT scan of the neck.     Sean A. Everardo All, MD  Electronically Signed    SAE/MedQ  DD: 02/04/2007  DT: 02/04/2007  Job #: [id number]   cc:   Adolph Pollack, M.D.

## 2011-05-11 NOTE — Op Note (Signed)
[name], [name]              ACCOUNT NO.:  [id number]   MEDICAL RECORD NO.:  [id number]          PATIENT TYPE:  OIB   LOCATION:  5729                         FACILITY:  [facility]   PHYSICIAN:  Adolph Pollack, M.D.DATE OF BIRTH:  08-18-1946   DATE OF PROCEDURE:  10/21/2006  DATE OF DISCHARGE:                                 OPERATIVE REPORT   PREOPERATIVE DIAGNOSIS:  Multinodular goiter of thyroid gland with  follicular lesion on the left side.   POSTOPERATIVE DIAGNOSIS:  Multinodular goiter of thyroid gland with  follicular lesion on the left side.   PROCEDURE:  Total thyroidectomy.   SURGEON:  Adolph Pollack, M.D.   ASSISTANT:  Francina Ames, MD   ANESTHESIA:  General.   INDICATIONS:  This is a 65 year old female who was found to have a  multinodular goiter with what appeared to be a dominant nodule on the left  side.  The nodule on the left side was biopsied, showed atypical cells  consistent with possible follicular neoplasm.  After discussion with her and  noting that she had multiple nodules, we have come to agreement on  performing a total thyroidectomy.  We discussed the procedure and risks  preoperatively.   TECHNIQUE:  She is seen in the holding area and brought to the operating  room, placed supine on the operating table and general anesthetic was  administered.  Her neck was placed in slight extension.  The neck and upper  chest wall were sterilely prepped and draped.  A transverse incision was  made in the lower neck dividing the skin, subcutaneous tissue and platysma  muscle.  Subplatysmal flaps were then raised superiorly to the thyroid  cartilage and inferiorly to the suprasternal notch.  Strap muscles were  identified and precervical fascia divided along the strap muscles to fall  laterally.  I dissected the strap muscles off the left lobe of thyroid gland  which was quite enlarged.  I then used some careful blunt dissection to  mobilize the lobe up  into the wound medially.  I began inferiorly and  identified the inferior parathyroid gland.  I divided the inferior thyroid  vessels between clips mobilized inferior lobe.  I then moved superiorly and  identified the superior vessels and divided these between clips and ties  mobilizing the superior lobe.  I then identified the recurrent laryngeal  nerve, kept my plane of dissection anterior to that.  A small superior  parathyroid gland was noted and preserved and its lateral blood supply  preserved.  I then was able to dissect the left lobe of thyroid gland off of  the trachea using electrocautery and also dissected the isthmus and  pyramidal lobe free from this.   Following this, I approached the right thyroid lobe.  A firm nodule was  noted inferiorly.  I dissected the strap muscles off the thyroid gland using  careful blunt dissection, electrocautery.  I then I mobilized the inferior  aspect of the thyroid gland with dissection on the thyroid and divided the  vessels between clips.  Inferior parathyroid gland was identified and  preserved.  I approached the superior lobe of thyroid with dissection on the  thyroid divided the vessels between clips.  I then identified the recurrent  laryngeal nerve and divided the middle thyroidal veins, inferior thyroidal  vessels, anterior to it between clips.  Was then able to dissect the gland  free from the trachea. I identified what appeared to be a small superior  parathyroid gland and preserved this.  I then removed the gland and sent it  to pathology with the left side being marked with sutures.   Following this I inspected both areas of bleeding.  This little very small  amount of bleeding close to the recurrent laryngeal nerve on the left side  and placed Surgicel here and held direct pressure and this resolved that.  I  irrigated both sides and no further bleeding was noted.  I then placed  Surgicel on the right side as well.  The strap  muscles were then  reapproximated with 3-0 Vicryl suture and platysma muscles were  reapproximated 3-0 Vicryl suture.  Skin was closed with 4-0 Monocryl running  subcuticular stitch followed by Steri-Strips and sterile dressing.   She tolerated the procedure without apparent complications and was taken to  recovery room in satisfactory condition.  Needle, sponge, instrument counts  were reported be correct prior to complete closure.      Adolph Pollack, M.D.  Electronically Signed     TJR/MEDQ  D:  10/21/2006  T:  10/22/2006  Job:  [id number]   cc:   Gregary Signs A. Everardo All, MD

## 2011-05-11 NOTE — Assessment & Plan Note (Signed)
[facility]                              CARDIOLOGY OFFICE NOTE   [name], Arizmendi [name]                     MRN:          [id number]  DATE:09/02/2006                            DOB:          1945-12-30    Patient is a very pleasant 65 year old white married female with  hypertension, hyperlipidemia, atypical chest pain, occasional palpitations,  non-obstructive coronary disease by angiogram February 2005.  She has normal  LV and normal abdominal aortogram.  She had a normal stress nuclear test  August 30, 2006.  She had recently had a stress on June 21, 2006.  She had  a right shoulder surgery by Dr. Rennis Chris, and now she is in need of apparently  a thyroidectomy per Dr. Everardo All, who found some atypical cells on a needle  biopsy.  The patient is quite stressed concerning this.  She is to see Dr.  Purnell Shoemaker next week.  She has had significant hyperlipidemia in the past  with an LDL of 233.   MEDICATIONS:  1. Norvasc 10.  2. Zetia 10.  3. Aspirin 81.  4. She has been on Lipitor 20 in the past but had some leg pain, and it      was discontinued.   PHYSICAL EXAMINATION:  VITAL SIGNS:  Blood pressure 148/82, pulse 92, normal  sinus rhythm.  GENERAL:  Appearance is normal.  NECK:  JVD is not elevated.  Carotid pulses elevated without bruits.  She  has a significant goiter.  LUNGS: Clear.  CARDIAC:  Unremarkable.  EXTREMITIES:  Normal.   I suggest the patient start on Lipitor 10 and take enzyme Q10.  Hopefully,  she can tolerate this, then she is able to follow up lipid and LFTs.  I will  see her in 3 months.                              Cecil Cranker, MD, [facility]    EJL/MedQ  DD:  09/02/2006  DT:  09/03/2006  Job #:  [id number]

## 2011-05-11 NOTE — Consult Note (Signed)
[facility]                          ENDOCRINOLOGY CONSULTATION   [name], [name]                     MRN:          [id number]  DATE:02/25/2007                            DOB:          1946/01/04    REASON FOR VISIT:  Follow up thyroid.   HISTORY OF PRESENT ILLNESS:  A 65 year old woman who is now 4 months  status post thyroidectomy for suspicious nodules.  She states she still  has some swelling at the anterior neck.   She now takes her calcitriol for her mild hypoparathyroidism.   Regarding her postoperative hypothyroidism, she is now on Synthroid 125  mcg a day.   For her chronic low back pain, she wants me to prescribe a lidocaine  patch for her.   PAST MEDICAL HISTORY:  1. Dyslipidemia.  2. Hyperglycemia.  3. Diffuse osteoarthritis.  4. Eczema.   REVIEW OF SYSTEMS:  She has slight chronic numbness of her right lower  extremity.  She denies any change in her weight.   PHYSICAL EXAMINATION:  VITAL SIGNS:  Blood pressure is 114/65, heart  rate 73, temperature is 97.7.  Her weight is 194.  GENERAL:  No distress.  NECK: At her surgical site, she has a healing surgical scar and minimal  swelling.  There is no tenderness, erythema, warmth, nor drainage.   LABORATORY DATA:  CT scan of the neck on February 14, 2007, is normal  except for absence of her thyroid.   IMPRESSION:  1. Postoperative minimal swelling of her anterior neck.  2. Mild hypoparathyroidism, which is usually transient.  3. Chronic low back pain due to osteoarthritis.  4. Postoperative hypothyroidism.   PLAN:  1. I tried very hard to reassure her that her surgery was successful      and without significant complications.  I told her that the      swelling and hypoparathyroidism are mild conditions which are      almost always transient.  2. Recheck PTH and TSH in 2 more weeks.  3. I prescribed for her a Lidoderm patch for her back.  4. I encouraged her to have  confidence in her health care providers,      as she has received excellent care and excellent results.     Sean A. Everardo All, MD  Electronically Signed    SAE/MedQ  DD: 02/25/2007  DT: 02/26/2007  Job #: [id number]   cc:   Adolph Pollack, M.D.  Willow Ora, MD

## 2011-05-11 NOTE — Op Note (Signed)
[name], [name]              ACCOUNT NO.:  [id number]   MEDICAL RECORD NO.:  [id number]          PATIENT TYPE:  AMB   LOCATION:  SDS                          FACILITY:  [facility]   PHYSICIAN:  Vania Rea. Supple, M.D.  DATE OF BIRTH:  [birth year]   DATE OF PROCEDURE:  06/21/2006  DATE OF DISCHARGE:                                 OPERATIVE REPORT   PREOPERATIVE DIAGNOSES:  1.  Chronic right shoulder impingement syndrome.  2.  Right shoulder symptomatic the [facility] joint arthrosis.  3.  Right shoulder rotator cuff tear.   POSTOPERATIVE DIAGNOSES:  1.  Chronic right shoulder impingement syndrome.  2.  Right shoulder symptomatic the [facility] joint arthrosis.  3.  Right shoulder rotator cuff tear.  4.  Complex extensive degenerative labral tear.  5.  Severe biceps tendon tear.   PROCEDURE:  1.  Right shoulder examination under anesthesia.  2.  Right shoulder diagnostic arthroscopy.  3.  Debridement of complex extensive labral tear.  4.  Arthroscopic biceps tenodesis.  5.  Arthroscopic subacromial depression and bursectomy.  6.  Arthroscopic distal clavicle section.  7.  Arthroscopic rotator cuff repair utilizing a double row suture bridge      repair construct.   SURGEON:  Francena Hanly, M.D.   ASSISTANT:  Ralene Bathe, P.A.-C.   ANESTHESIA:  Was a general endotracheal as well as a preop interscalene  block.   ESTIMATED BLOOD LOSS:  Minimal.   HISTORY:  [name] is a 65 year old female who has had chronic right  shoulder pain, weakness and limitations in motion with examination showing  positive impingement sign as well as pain and weakness with testing of the  rotator cuff.   MRI scan confirms a rotator cuff tear with AC joint arthropathy.  Due to her  ongoing pain and functional limitations, she was brought to the operating at  this time for planned right shoulder arthroscopy as described below.   Preoperatively, [name] was informed of treatment options as well as  risks  versus benefits thereof.  Possible surgical complications, bleeding,  infection, neurovascular injury, persistent pain, loss of motion, anesthetic  complication, recurrence of rotator cuff tear, possible need for additional  surgery were reviewed.  She understands and accepts and agrees with our  planned procedure.   PROCEDURE IN DETAIL:  After undergoing routine preop evaluation, the patient  received prophylactic antibiotics.  Interscalene block was established in  the holding area by the anesthesia department.  Placed supine on the  operative table and underwent smooth induction of a general tracheal  anesthesia.  Turned to the left lateral decubitus position on the beanbag  and appropriately padded and protected.  Right shoulder examination under  anesthesia revealed full passive motion.  No obvious instability patterns.  Right arm suspended at 70 degrees of abduction, and the right shoulder  girdle region was sterilely prepped and draped in standard fashion.  Posterior portal established into the glenohumeral joint, and anterior  portal was established under direct visualization.  The glenohumeral  articular surfaces were in good condition.  There was extensive degenerative  tearing of the  superior half of the labrum, and these areas were debrided  with a shaver.  The biceps tendon showed severe attenuation and fraying with  a longitudinal intrasubstance tear at the level the rotator interval, and as  this area was retracted into the joint, there was any significant  degeneration and attenuation of the biceps tendon fibers.  Considering the  degree of attenuation and the location of the biceps tendon injury, we  elected to proceed with a biceps tenodesis.  A 0 PDS was passed through the  intra-articular portion of biceps tendon, and then the biceps was amputated  from its superior glenoid insertion using the Arthrex wand.  A shaver then  used to complete the debridement of the  superior labral and residual  superior labral tissues.  The rotator cuff showed an obvious full-thickness  tear just posterior to the rotator interval involving the distal  supraspinatus, and this area was debrided from the articular side using the  shaver.  Once this was completed, the remaining inspection of the  glenohumeral joint showed no obvious additional intra-articular pathology.  Fluid and instruments were then removed.  The arm was dropped down to 30  degrees abduction with the arthroscope introduced into the subacromial space  of the posterior Port-A-Cath,l and a direct lateral portal was established  in the subacromial space.  Abundant proliferative bursal tissue and multiple  adhesions were encountered, and these were removed with a combination of a  shaver and the Arthrex wand.  The wand was then used to remove the  periosteum from the undersurface of the anterior half of the acromion, and a  subacromial decompression was performed with a bur, creating type 1  morphology.  This nicely decompressed the subacromial space.  A portal was  then established directly anterior to the distal clavicle, and a distal  clavicle resection was performed with a bur.  Care was taken to make sure  that the entire circumference of the distal clavicle could be visualized to  ensure adequate removal of bone.  The bursectomy was then completed.  Looking from the articular side, the rotator cuff showed once again the  obvious tear, and the rotator was debrided back to stable margin with the  shaver.  Residual soft tissue within the defect on the greater tuberosity  was removed with the Arthrex wand, and then a bur was used to abrade the  bone to a bleeding base.  We then retrieved the biceps tendon through the  rotator cuff tear, and then by applying traction with PDS suture brought the  biceps tendon into the subacromial space to the level for passage of a FiberWire suture for the tenodesis.  The  Mitek suture retriever was then  used to make multiple passes through the biceps tendon at the proper level  to allow the tenodesis.  These suture limbs were then brought out through an  accessory anterolateral portal.  At this point, an accessory portal  ___________ was then established.  Through a stab wound off the lateral  margin of the acromion, we placed an Arthrex Bio-Corkscrew suture anchor.  The free limbs of the suture anchor then brought through the adjacent margin  of rotator cuff in a horizontal mattress construct, and these were then tied  with sliding locking knots followed by multiple overhand throws and  alternating posts.  We then created a suture bridge double row repair  construct, and through the anterior push-lock anchor, we also passed the  limbs of the biceps  limbs of suture that were through the proximal biceps.  The push-lock anchor was then introduced into the bone tunnel on the greater  tuberosity, and these were then individually tensioned, and as the push-lock  anchor was terminally seated, we showed excellent compression and tension  upon the sutures into the rotator cuff and also excellent stability and  position of the biceps tendon within the bicipital groove.  The posterior  push-lock anchor was then placed in standard fashion.  This nicely  reinforced the repair the rotator cuff and decompressed the rotator cuff  margin against the bony bed on the greater tuberosity.  At this point, a  final inspection and irrigation was then completed.  Fluid and instruments  were removed.  The portals were closed with Monocryl  and Steri-Strips.  A bulky dry dressing was then taped on the right  shoulder. The portals were closed with Monocryl and Steri-Strips.  Bulky dry  dressing taped on the right shoulder, and right arm was placed in a sling  immobilizer.  The patient was then placed supine, extubated and taken to the  recovery room in stable condition.       Vania Rea. Supple, M.D.  Electronically Signed     KMS/MEDQ  D:  06/21/2006  T:  06/21/2006  Job:  [id number]

## 2011-05-11 NOTE — H&P (Signed)
[name], [name]                          ACCOUNT NO.:  [id number]   MEDICAL RECORD NO.:  [id number]                   PATIENT TYPE:  EMS   LOCATION:  MAJO                                 FACILITY:  [facility]   PHYSICIAN:  Arturo Morton. Riley Kill, M.D. [facility] [place]         DATE OF BIRTH:  April 07, 1946   DATE OF ADMISSION:  03/06/2004  DATE OF DISCHARGE:                                HISTORY & PHYSICAL   CHIEF COMPLAINT:  Chest pain.   HISTORY OF PRESENT ILLNESS:  The patient is a 65 year old female with no  known history of coronary artery disease.  She apparently had a cardiac  catheterization in [place] approximately 10-15 years ago.  She has had  marked hypertension and hyperlipidemia, and she was told to come to the  office if she had further episodes of chest pain.  She had a Cardiolite done  in October 2004, without scar ischemia, and an ejection fraction of 69% with  some thinning at the apex.  Today at work she developed some sharp chest  pain that was made perhaps slightly worse with inspiration.  It was dull.  She developed some fatigue.  She ultimately spoke with her family and was  brought to the [facility] where an electrocardiogram was done and was  unremarkable.  She was sent to the emergency room for further evaluation.   PAST MEDICAL HISTORY:  1. Remarkable for hypercholesterolemia.  2. Hypertension.   ALLERGIES:  No known drug allergies.   MEDICATIONS:  1. Tylenol p.r.n.  2. Lipitor 40 mg daily.  3. Norvasc 10 mg daily.  4. A baby aspirin daily.   SOCIAL HISTORY:  The patient lives in [place] and is married.  She does  not smoke.  She does not drink.  Her father died of coronary artery disease.  The patient is a Geologist, engineering at [employer].   REVIEW OF SYSTEMS:  Remarkable for only chest pain.  She has no other  symptoms.  Remarkably the patient has not been on any long car rides or been  on estrogen replacement therapy.   PHYSICAL EXAMINATION:  GENERAL:  She is an  alert oriented female, in no  acute distress at the present time.  VITAL SIGNS:  Temperature 97.9 degrees, pulse 63, respirations 20, blood  pressure 121/65.  SAO2 is 97% on room air.  NECK:  Carotid upstrokes are brisk.  LUNGS:  Fields are clear to auscultation and percussion.  HEART:  The PMI is not displaced.  There is a normal first and second heart  sound.  The cardiac rhythm is regular without gallops, rubs, or murmurs.  ABDOMEN:  Soft without obvious hepatosplenomegaly.  EXTREMITIES:  No edema.  Pulses are full and intact.  The electrocardiogram demonstrates a normal sinus rhythm, is within normal  limits.  All labs are pending.   IMPRESSION:  1. Chest pain with an inspiratory component, atypical for ischemia, but with  multiple cardiac risk factors.  2. Hypertension.  3. Hyperlipidemia.  4. Family history.   PLAN:  The patient will be admitted.  Serial enzymes and electrocardiogram  will be obtained.  She will be started on intravenous heparin once the chest  x-ray is available.  We will get a D-dimer as well.  We will need to decide  whether further treatment and further evaluation is necessary.  Following  this, plans will be made for further intervention.                                                Arturo Morton. Riley Kill, M.D. [facility]    TDS/MEDQ  D:  03/06/2004  T:  03/07/2004  Job:  [id number]   cc:   Cecil Cranker, M.D.   Wanda Plump, MD [facility]  [phone] [address] [place], [place] [address]

## 2011-05-15 ENCOUNTER — Ambulatory Visit: Payer: 59 | Attending: Orthopedic Surgery | Admitting: Physical Therapy

## 2011-05-15 DIAGNOSIS — M545 Low back pain, unspecified: Secondary | ICD-10-CM | POA: Insufficient documentation

## 2011-05-15 DIAGNOSIS — M25559 Pain in unspecified hip: Secondary | ICD-10-CM | POA: Insufficient documentation

## 2011-05-15 DIAGNOSIS — IMO0001 Reserved for inherently not codable concepts without codable children: Secondary | ICD-10-CM | POA: Insufficient documentation

## 2011-05-17 ENCOUNTER — Ambulatory Visit: Payer: 59 | Admitting: Rehabilitation

## 2011-05-22 ENCOUNTER — Encounter: Payer: Self-pay | Admitting: Gastroenterology

## 2011-05-24 ENCOUNTER — Ambulatory Visit: Payer: 59 | Admitting: Physical Therapy

## 2011-06-21 ENCOUNTER — Ambulatory Visit (AMBULATORY_SURGERY_CENTER): Payer: 59 | Admitting: *Deleted

## 2011-06-21 ENCOUNTER — Encounter: Payer: Self-pay | Admitting: Gastroenterology

## 2011-06-21 VITALS — Ht 65.0 in | Wt 199.0 lb

## 2011-06-21 DIAGNOSIS — Z800 Family history of malignant neoplasm of digestive organs: Secondary | ICD-10-CM

## 2011-06-21 MED ORDER — PEG-KCL-NACL-NASULF-NA ASC-C 100 G PO SOLR: ORAL | Status: DC

## 2011-06-24 DIAGNOSIS — D126 Benign neoplasm of colon, unspecified: Secondary | ICD-10-CM

## 2011-06-24 HISTORY — DX: Benign neoplasm of colon, unspecified: D12.6

## 2011-07-05 ENCOUNTER — Encounter: Payer: Self-pay | Admitting: Gastroenterology

## 2011-07-05 ENCOUNTER — Ambulatory Visit (AMBULATORY_SURGERY_CENTER): Payer: 59 | Admitting: Gastroenterology

## 2011-07-05 VITALS — BP 122/77 | HR 61 | Temp 97.6°F | Resp 21 | Ht 65.0 in | Wt 200.0 lb

## 2011-07-05 DIAGNOSIS — Z1211 Encounter for screening for malignant neoplasm of colon: Secondary | ICD-10-CM

## 2011-07-05 DIAGNOSIS — Z800 Family history of malignant neoplasm of digestive organs: Secondary | ICD-10-CM

## 2011-07-05 DIAGNOSIS — D1330 Benign neoplasm of unspecified part of small intestine: Secondary | ICD-10-CM

## 2011-07-05 DIAGNOSIS — D126 Benign neoplasm of colon, unspecified: Secondary | ICD-10-CM

## 2011-07-05 MED ORDER — SODIUM CHLORIDE 0.9 % IV SOLN: 500.0000 mL | INTRAVENOUS | Status: DC

## 2011-07-05 NOTE — Progress Notes (Signed)
Pt uncomfortable while the scope was being advanced.  Medications were titrated per md's orders.  MAW  While trying to get the pt to help move to her back the scope slide into the cecum.Marland Kitchen  Pt the right back to her left side.  Pt relaxed and rested comfortable on the way out of the cecum and through out the rest of the exam.  MAW

## 2011-07-05 NOTE — Patient Instructions (Signed)
Discharge instructions given with verbal understanding. Handout on polyps given. Resume previous medications. 

## 2011-07-06 ENCOUNTER — Telehealth: Payer: Self-pay | Admitting: *Deleted

## 2011-07-06 NOTE — Telephone Encounter (Signed)

## 2011-07-10 ENCOUNTER — Encounter: Payer: Self-pay | Admitting: Gastroenterology

## 2011-07-11 ENCOUNTER — Other Ambulatory Visit (INDEPENDENT_AMBULATORY_CARE_PROVIDER_SITE_OTHER): Payer: 59

## 2011-07-11 ENCOUNTER — Other Ambulatory Visit: Payer: Self-pay | Admitting: Internal Medicine

## 2011-07-11 DIAGNOSIS — E782 Mixed hyperlipidemia: Secondary | ICD-10-CM

## 2011-07-11 LAB — LIPID PANEL
Total CHOL/HDL Ratio: 5
VLDL: 18.2 mg/dL (ref 0.0–40.0)

## 2011-07-11 LAB — AST: AST: 16 U/L (ref 0–37)

## 2011-07-17 ENCOUNTER — Encounter: Payer: Self-pay | Admitting: Gynecology

## 2011-07-23 ENCOUNTER — Telehealth: Payer: Self-pay | Admitting: *Deleted

## 2011-07-23 DIAGNOSIS — E782 Mixed hyperlipidemia: Secondary | ICD-10-CM

## 2011-07-23 NOTE — Telephone Encounter (Signed)
See note concerning Lipid panel.

## 2011-07-26 ENCOUNTER — Other Ambulatory Visit: Payer: 59

## 2011-07-30 ENCOUNTER — Encounter: Payer: Self-pay | Admitting: Gastroenterology

## 2011-08-03 ENCOUNTER — Encounter: Payer: Self-pay | Admitting: Gynecology

## 2011-08-03 ENCOUNTER — Ambulatory Visit (INDEPENDENT_AMBULATORY_CARE_PROVIDER_SITE_OTHER): Payer: 59 | Admitting: Gynecology

## 2011-08-03 VITALS — BP 120/74

## 2011-08-03 DIAGNOSIS — D126 Benign neoplasm of colon, unspecified: Secondary | ICD-10-CM

## 2011-08-03 DIAGNOSIS — K635 Polyp of colon: Secondary | ICD-10-CM

## 2011-08-03 NOTE — Progress Notes (Signed)
Patient is a 65 year old who presented to the office for my opinion in reference to her recent screening colonoscopy were by colonic polyps from the cecum and transverse colon were removed which demonstrated fragments of tubular adenoma. No high grade dysplasia or malignancy was identified. It is recommended that patient follow up with a screening colonoscopy in 3 years. And her annual exam Hemoccult testing will be done in the office in between this time intervals. She will report to the office if she notices any blood at time of her bowel movements or any other time. She was instructed to start eating high fiber foods as was the start taking folic acid daily as well. She's been followed by Dr.Ballenfor her hypoparathyroidism. Her recent Pap smear in May of this year was normal. Literature information was provided on colon polyps as well as a new followup guidelines from the American gastroenterological society. Will otherwise see her back in one year or when necessary.

## 2011-08-03 NOTE — Patient Instructions (Signed)
Colon Polyps Patient Information        All topics are updated as new evidence becomes available and our peer review process is complete.  Literature review current through: Jun 2012.  This topic last updated: Aug 16, 2009.  COLON POLYPS OVERVIEW - The presence of polyps in the colon or rectum often raises questions for patients and their family. What is the significance of finding a polyp? Does this mean that I have, or will develop, colon or rectal (colorectal) cancer? Will a polyp require surgery? Some types of polyps (called adenomas) have the potential to become cancerous while others (hyperplastic or inflammatory polyps) have virtually no chance of becoming cancerous. When discussing colon polyps, the following points should be considered: Polyps are common (they occur in 30 to 50 percent of adults)  Not all polyps will become cancer  It takes many years for a polyp become cancerous  Polyps can be completely and safely removed The best course of action when a polyp is found depends upon the number, type, size, and location of the polyp. People who have an adenoma removed will require a follow up examination; new polyps may develop over time that need to be removed. COLON POLYP CAUSES - Polyps are very common in men and women of all races who live in industrialized countries, suggesting that dietary and environmental factors play a role in their development. Lifestyle - Although the exact causes are not completely understood, lifestyle risk factors include the following: A high fat diet  A diet high in red meat  A low fiber diet  Cigarette smoking  Obesity On the other hand, use of aspirin and other NSAIDs and a high calcium diet may protect against the development of colon cancer.Aging - Colorectal cancer is uncommon before age 68. Ninety percent of cases occur after age 55, with men and women being similarly affected; therefore,  colon cancer screening is usually recommended starting at age 48 for both sexes. It takes approximately 10 years for a small polyp to develop into cancer. Family history and genetics - Polyps and colon cancer tend to run in families, suggesting that genetic factors are also important in their development. Any history of colon polyps or colon cancer in the family should be discussed with a healthcare provider, particularly if cancer developed at an early age, in close relatives, or in multiple family members. As a general rule, screening for colon cancer begins at an earlier age in people with a family history of cancer or polyps. Rare genetic diseases can cause high rates of colorectal cancer relatively early in adult life. One such disease, called familial adenomatous polyposis (FAP), causes multiple colon polyps. Another, Hereditary Non-Polyposis Colon Cancer (HNPCC), increases the risk of colon cancer, often beginning in the 20s and 30s, but does not cause a large number of polyps. Testing for these genes may be recommended for families with high rates of colorectal cancer, but is not generally recommended for other groups. TYPES OF COLON POLYPS - The most common types of polyps are hyperplastic and adenomatous polyps. Other types of polyps can also be found in the colon, although these are far less common and are not discussed here. Hyperplastic polyps - Hyperplastic polyps are usually small, located in the end-portion of the colon (the rectum and sigmoid colon), have no potential to become malignant, and are not worrisome). It is not always possible to distinguish a hyperplastic polyp from an adenomatous polyp based upon appearance during colonoscopy, which means that hyperplastic  polyps are often removed or biopsied to allow microscopic examination. Adenomatous polyps - Two-thirds of colon polyps are adenomas. Most of these polyps do not develop into cancer, although they have the potential to become  cancerous. Adenomas are classified by their size, general appearance, and their specific features as seen under the microscope. As a general rule, the larger the adenoma, the more likely it is to eventually become a cancer. As a result, large polyps are usually removed completely to allow for microscopic examination. Malignant polyps - Polyps that contain pre-cancerous or cancerous cells are known as malignant polyps. The optimal treatment for malignant polyps depends upon the extent of the cancer (when examined with a microscope) and other individual factors.Marland Kitchen) COLON POLYP DIAGNOSIS - Polyps usually do not cause symptoms but may be detected during a colon cancer screening examination (such as flexible sigmoidoscopy or colonoscopy) or after a positive fecal occult blood test. Polyps can also be detected on a barium enema x-ray, although small polyps are more difficult to see with x-ray. Colonoscopy is the best way to evaluate the colon because it allows the physician to see the entire lining of the colon and remove any polyps that are found. During colonoscopy, a physician inserts a very thin flexible tube with a light source and small camera into the anus. The tube is advanced through the entire length of the large intestine (colon).Marland Kitchen) The inside of the colon is a tube-like structure with a flat surface with curved folds. A polyp appears as a lump that protrudes into the inside of the colonThe tissue covering a polyp may look the same as normal colon tissue, or, there may be tissue changes ranging from subtle color changes to ulceration and bleeding. Some polyps are flat ("sessile") and others extend out on a stalk ("pedunculated"). Colonoscopy is also the best test for the follow-up examination of polyps. Virtual colonoscopy using CT technology is another test used to detect polyps in specific circumstances. COLON POLYP REMOVAL - Colorectal cancer is the second leading cause of cancer deaths in the [place], accounting for 14 percent of cancer deaths. Colorectal cancer is preventable if precancerous polyps (ie, adenomas) are detected and removed before they become malignant (cancerous). Over time, small polyps can change their structure and become cancerous. Polyps are usually removed when they are found on colonoscopy, which eliminates the chance for that polyp to become cancerous. Procedure - The medical term for removing polyps is polypectomy. Most polypectomies can be performed through a colonoscope. Small polyps can be removed with an instrument that is inserted through the colonoscope and snips off small pieces of tissue. Larger polyps are usually removed by placing a noose, or snare, around the polyp base and burning through it with electric cautery. The cautery also helps to stop bleeding after the polyp is removed. Polyp removal is not painful because the lining of the colon does not have the ability to feel pain. In addition, a sedative medication is given before the colonoscopy to prevent pain caused by stretching of the colon. Rarely, a polyp will be too large to remove during colonoscopy, which means that a surgical procedure will be needed at a later time. Complications - Polypectomy is safe although it has a few potential risks and complications. The most common complications are bleeding and perforation (creating a hole in the colon). Fortunately, this occurs infrequently (one in 1000 patients having colonoscopy). Bleeding can usually be controlled during colonoscopy by cauterizing (applying heat) to the bleeding site; surgery  is sometimes required for perforation. After polyp removal - Medications that can increase bleeding, including aspirin, ibuprofen (Advil, Motrin), and naproxen (Aleve), should be avoided for approximately two weeks after polypectomy. Acetaminophen (Tylenol) is safe to take. People who require anticoagulant medications such as warfarin (Coumadin) should discuss how  and when to resume this medication with their clinician. A follow up appointment or phone call is usually scheduled after the polyp removal to discuss the results of the tissue analysis and the need for a repeat examination. COLON POLYP PREVENTION Follow up examination - People with adenomatous polyps have an increased risk of developing more polyps, which are likely to be adenomatous. There is a 25 to 30 percent chance that adenomas will be present on a repeat colonoscopy done three years after the initial polypectomy. Some of these polyps may have been present during the original examination, but were too small to detect. Other new polyps may also have developed. After polyps are removed, repeat colonoscopy is recommended, usually three to five years after the initial colonoscopy. However, this time interval depends upon several factors: Microscopic characteristics of the polyp  Number and size of the polyps  The appearance of the colon during the colonoscopy. A bowel preparation is needed before colonoscopy to remove all traces of feces (stool). If the bowel prep was not completed, feces may remain in the colon, making it more difficult to see small to moderate size polyps. In this situation, follow up colonoscopy may be recommended sooner than three to five years later. Persons who undergo screening (and re-screening) for colon cancer are much less likely to die from colon cancer. Thus, following screening guidelines is one of the most important measures. Preventing colon cancer - Intensive research is underway to develop ways to prevent polyps and colon cancer with diet or medications. A number of nutrients and medications have been identified that may reduce the risk of colon cancer. Guidelines issued by one of the major medical societies in the [place] (the [employer]) suggest the following to prevent polyps from recurring: Eat a diet that is low in fat and high in  fruits, vegetables, and fiber  Maintain a normal body weight  Avoid smoking and excessive alcohol use  IMPLICATIONS FOR THE FAMILY - First-degree relatives (a parent, brother, sister, or child) of a person who has been diagnosed with an adenomatous polyp (or colorectal cancer) before the age of 92 years have an increased risk of developing adenomatous polyps and colorectal cancer compared to the general population. Thus, family should be made aware if the person is diagnosed with an adenoma or colon cancer. While screening for polyps and cancer is recommended for everyone (typically beginning at age 46), those at increased risk should begin screening earlier, typically at age 15. The best test for screening in people with an increased risk of cancer is not known, although a sensitive test (such as colonoscopy) is usually recommended. Relatives can be told the following: People who have one first-degree relative (parent, brother, sister, or child) with colorectal cancer or an adenomatous polyps at a young age (before the age of 63 years), or two first-degree relatives diagnosed at any age, should begin screening for colon cancer earlier, typically at age 69, or 106 years younger than the earliest diagnosis in their family, whichever comes first. Screening usually includes colonoscopy, which should be repeated every five years.  People who have one first-degree relative (parent, brother, sister, or child) with colorectal cancer or an adenomatous  polyp at age 76 or later should begin screening at age 25. If the examination shows no polyps, it should be repeated similar to a person with an average risk of colon cancer.  People with a second-degree relative (grandparent, aunt, or uncle) or third-degree relative (great-grandparent or cousin) with colorectal cancer should be screened for colon cancer similar to a person with an average risk. Some conditions, such as hereditary nonpolyposis colorectal cancer,  familial adenomatous polyposis, and inflammatory bowel disease (eg, ulcerative colitis, Crohn's disease) significantly increase the risk of colon polyps or cancer in family members. Colon cancer screening in this group is discussed separately.

## 2011-08-29 ENCOUNTER — Ambulatory Visit (INDEPENDENT_AMBULATORY_CARE_PROVIDER_SITE_OTHER): Payer: 59 | Admitting: Gastroenterology

## 2011-08-29 ENCOUNTER — Encounter: Payer: Self-pay | Admitting: Gastroenterology

## 2011-08-29 VITALS — BP 128/70 | HR 68 | Ht 65.0 in | Wt 196.6 lb

## 2011-08-29 DIAGNOSIS — K219 Gastro-esophageal reflux disease without esophagitis: Secondary | ICD-10-CM

## 2011-08-29 DIAGNOSIS — Z86010 Personal history of colonic polyps: Secondary | ICD-10-CM

## 2011-08-29 NOTE — Progress Notes (Signed)
History of Present Illness: This is a 65 year old female who is having recurrent problems with acid reflux generally occurring at night for several years. She takes TUMS as needed. She has tried Nexium and omeprazole which have substantially helped her symptoms however she feels that these medications "lower her calcium and cause numbness and tingling" so she discontinued them. She's not had blood work done while she is on this medication evaluate these concerns. She has tried Zantac on several occasions which helped but she was afraid of the same side effects if she does not take it. She has had a problem with hoarseness since her thyroidectomy which has been evaluated at [facility]  ENT by Dr. Pollyann Kennedy and surgical correction of her vocal cords has been recommended per the patient's report.  In addition she had questions about her colonoscopy and colon polyps. Denies weight loss, abdominal pain, constipation, diarrhea, change in stool caliber, melena, hematochezia, nausea, vomiting, dysphagia, chest pain.   Current Medications, Allergies, Past Medical History, Past Surgical History, Family History and Social History were reviewed in [employer] record.  Physical Exam: General: Well developed , well nourished, no acute distress Head: Normocephalic and atraumatic Eyes:  sclerae anicteric, EOMI Ears: Normal auditory acuity Mouth: No deformity or lesions Lungs: Clear throughout to auscultation Heart: Regular rate and rhythm; no murmurs, rubs or bruits Abdomen: Soft, non tender and non distended. No masses, hepatosplenomegaly or hernias noted. Normal Bowel sounds Musculoskeletal: Symmetrical with no gross deformities  Pulses:  Normal pulses noted Extremities: No clubbing, cyanosis, edema or deformities noted Neurological: Alert oriented x 4, grossly nonfocal Psychological:  Alert and cooperative. Normal mood and affect  Assessment and Recommendations:  1. GERD. Begin standard  antireflux measures. Trial of ranitidine 150 mg twice daily and use TUMS when necessary. I don't fully understand her concerns regarding PPIs immediately lowering her calcium leading to symptoms however if her symptoms cannot be adequately managed with an H2 receptor antagonists she may need further evaluation with her primary physician or endocrinologist. The risks, benefits, and alternatives to endoscopy with possible biopsy and possible dilation were discussed with the patient and they consent to proceed.   2. Personal history of adenomatous colon polyps and a family history of colon cancer. Surveillance colonoscopy July 2015.  3. Hoarseness. GERD may be contributing however it appears she has vocal cord dysfunction her prior ENT evaluation.

## 2011-08-29 NOTE — Patient Instructions (Signed)
You have been scheduled for a Upper Endoscopy. See separate instructions.  Patient advised to avoid spicy, acidic, citrus, chocolate, mints, fruit and fruit juices.  Limit the intake of caffeine, alcohol and Soda.  Don't exercise too soon after eating.  Don't lie down within 3-4 hours of eating.  Elevate the head of your bed. Take Zantac over the counter one tablet by mouth twice daily. cc: Reynaldo Minium, MD

## 2011-09-25 LAB — CBC
Hemoglobin: 12.6
MCV: 85.5
Platelets: 235
RBC: 4.34
WBC: 8.6
WBC: 9.1

## 2011-09-25 LAB — BASIC METABOLIC PANEL
Calcium: 8.5
GFR calc Af Amer: >60
GFR calc non Af Amer: >60
Sodium: 139

## 2011-09-25 LAB — TROPONIN I: Troponin I: 0.02

## 2011-09-25 LAB — DIFFERENTIAL
Lymphocytes Relative: 25
Lymphs Abs: 2.3
Neutro Abs: 5.9
Neutrophils Relative %: 65

## 2011-09-25 LAB — COMPREHENSIVE METABOLIC PANEL
AST: 22
BUN: 15
CO2: 28
Calcium: 8.6
Creatinine, Ser: 1.01
GFR calc Af Amer: >60
GFR calc non Af Amer: 56 — ABNORMAL LOW

## 2011-09-25 LAB — CARDIAC PANEL(CRET KIN+CKTOT+MB+TROPI)
CK, MB: 0.9
Relative Index: INVALID
Relative Index: INVALID
Troponin I: 0.01

## 2011-09-25 LAB — PROTIME-INR
INR: 1
Prothrombin Time: 12.8

## 2011-09-25 LAB — APTT: aPTT: 98 — ABNORMAL HIGH

## 2011-09-25 LAB — CK TOTAL AND CKMB (NOT AT ARMC): Relative Index: INVALID

## 2011-09-26 ENCOUNTER — Telehealth: Payer: Self-pay | Admitting: Gastroenterology

## 2011-09-26 NOTE — Telephone Encounter (Signed)
OK 

## 2011-09-26 NOTE — Telephone Encounter (Signed)
Patient states that she needs her procedure coded as preventative.  I have tried to explain that an upper endoscopy is not a screening procedure.  She according to the office notes has GERD.  Patient asks that I cancel her procedure until she can do further research with her insurance.

## 2011-10-01 ENCOUNTER — Other Ambulatory Visit: Payer: 59 | Admitting: Gastroenterology

## 2011-11-19 ENCOUNTER — Other Ambulatory Visit: Payer: Self-pay

## 2011-11-19 ENCOUNTER — Encounter (HOSPITAL_COMMUNITY): Payer: Self-pay | Admitting: Emergency Medicine

## 2011-11-19 ENCOUNTER — Inpatient Hospital Stay (HOSPITAL_COMMUNITY)
Admission: EM | Admit: 2011-11-19 | Discharge: 2011-11-22 | DRG: 247 | Disposition: A | Discharge status: Home / Self Care | Payer: 59 | Source: Ambulatory Visit | Attending: Cardiology | Admitting: Cardiology

## 2011-11-19 ENCOUNTER — Encounter (HOSPITAL_COMMUNITY)
Admission: EM | Disposition: A | Discharge status: Home / Self Care | Payer: Self-pay | Source: Ambulatory Visit | Attending: Cardiology

## 2011-11-19 DIAGNOSIS — I2510 Atherosclerotic heart disease of native coronary artery without angina pectoris: Secondary | ICD-10-CM | POA: Diagnosis present

## 2011-11-19 DIAGNOSIS — I214 Non-ST elevation (NSTEMI) myocardial infarction: Principal | ICD-10-CM | POA: Diagnosis present

## 2011-11-19 DIAGNOSIS — Z860100 Personal history of colon polyps, unspecified: Secondary | ICD-10-CM

## 2011-11-19 DIAGNOSIS — I379 Nonrheumatic pulmonary valve disorder, unspecified: Secondary | ICD-10-CM

## 2011-11-19 DIAGNOSIS — R42 Dizziness and giddiness: Secondary | ICD-10-CM | POA: Diagnosis present

## 2011-11-19 DIAGNOSIS — G8929 Other chronic pain: Secondary | ICD-10-CM | POA: Diagnosis present

## 2011-11-19 DIAGNOSIS — E890 Postprocedural hypothyroidism: Secondary | ICD-10-CM | POA: Diagnosis present

## 2011-11-19 DIAGNOSIS — Z79899 Other long term (current) drug therapy: Secondary | ICD-10-CM

## 2011-11-19 DIAGNOSIS — Z7982 Long term (current) use of aspirin: Secondary | ICD-10-CM

## 2011-11-19 DIAGNOSIS — E785 Hyperlipidemia, unspecified: Secondary | ICD-10-CM | POA: Diagnosis present

## 2011-11-19 DIAGNOSIS — E209 Hypoparathyroidism, unspecified: Secondary | ICD-10-CM | POA: Diagnosis present

## 2011-11-19 DIAGNOSIS — M79609 Pain in unspecified limb: Secondary | ICD-10-CM | POA: Diagnosis present

## 2011-11-19 DIAGNOSIS — Z7902 Long term (current) use of antithrombotics/antiplatelets: Secondary | ICD-10-CM

## 2011-11-19 DIAGNOSIS — K219 Gastro-esophageal reflux disease without esophagitis: Secondary | ICD-10-CM | POA: Diagnosis present

## 2011-11-19 DIAGNOSIS — E876 Hypokalemia: Secondary | ICD-10-CM | POA: Diagnosis not present

## 2011-11-19 DIAGNOSIS — I10 Essential (primary) hypertension: Secondary | ICD-10-CM | POA: Diagnosis present

## 2011-11-19 DIAGNOSIS — I213 ST elevation (STEMI) myocardial infarction of unspecified site: Secondary | ICD-10-CM

## 2011-11-19 DIAGNOSIS — R079 Chest pain, unspecified: Secondary | ICD-10-CM

## 2011-11-19 DIAGNOSIS — I252 Old myocardial infarction: Secondary | ICD-10-CM | POA: Diagnosis present

## 2011-11-19 HISTORY — DX: Atherosclerotic heart disease of native coronary artery without angina pectoris: I25.10

## 2011-11-19 HISTORY — PX: PERCUTANEOUS CORONARY STENT INTERVENTION (PCI-S): SHX5485

## 2011-11-19 HISTORY — DX: Nontoxic multinodular goiter: E04.2

## 2011-11-19 LAB — CARDIAC PANEL(CRET KIN+CKTOT+MB+TROPI)
CK, MB: 7.1 ng/mL (ref 0.3–4.0)
CK, MB: 19.7 ng/mL (ref 0.3–4.0)
CK, MB: 21.2 ng/mL (ref 0.3–4.0)
Relative Index: 5.3 — ABNORMAL HIGH (ref 0.0–2.5)
Total CK: 211 U/L — ABNORMAL HIGH (ref 7–177)
Troponin I: 0.47 ng/mL (ref <0.30)
Troponin I: 2.65 ng/mL (ref <0.30)

## 2011-11-19 LAB — CREATININE, SERUM: GFR calc Af Amer: 67 mL/min — ABNORMAL LOW (ref >90)

## 2011-11-19 LAB — MRSA PCR SCREENING: MRSA by PCR: NEGATIVE

## 2011-11-19 LAB — CBC
HCT: 35.6 % — ABNORMAL LOW (ref 36.0–46.0)
Hemoglobin: 11.9 g/dL — ABNORMAL LOW (ref 12.0–15.0)
MCHC: 34.1 g/dL (ref 30.0–36.0)
MCV: 85 fL (ref 78.0–100.0)
Platelets: 275 10*3/uL (ref 150–400)
RBC: 4.19 MIL/uL (ref 3.87–5.11)
RDW: 14.2 % (ref 11.5–15.5)
WBC: 11.6 10*3/uL — ABNORMAL HIGH (ref 4.0–10.5)
WBC: 9 10*3/uL (ref 4.0–10.5)

## 2011-11-19 LAB — BASIC METABOLIC PANEL
Calcium: 9.6 mg/dL (ref 8.4–10.5)
Creatinine, Ser: 1.08 mg/dL (ref 0.50–1.10)
GFR calc non Af Amer: 53 mL/min — ABNORMAL LOW (ref >90)
Glucose, Bld: 88 mg/dL (ref 70–99)
Sodium: 143 mEq/L (ref 135–145)

## 2011-11-19 LAB — PLATELET INHIBITION P2Y12: Platelet Function  P2Y12: 42 [PRU] — ABNORMAL LOW (ref 194–418)

## 2011-11-19 LAB — DIFFERENTIAL
Basophils Absolute: 0.1 10*3/uL (ref 0.0–0.1)
Basophils Relative: 1 % (ref 0–1)
Eosinophils Relative: 3 % (ref 0–5)
Lymphocytes Relative: 37 % (ref 12–46)
Neutro Abs: 5.8 10*3/uL (ref 1.7–7.7)

## 2011-11-19 LAB — PROTIME-INR: Prothrombin Time: 12.2 seconds (ref 11.6–15.2)

## 2011-11-19 LAB — POCT ACTIVATED CLOTTING TIME: Activated Clotting Time: 402 seconds

## 2011-11-19 LAB — POCT I-STAT TROPONIN I

## 2011-11-19 SURGERY — PERCUTANEOUS CORONARY STENT INTERVENTION (PCI-S)

## 2011-11-19 MED ORDER — ROSUVASTATIN CALCIUM 20 MG PO TABS
20.0000 mg | ORAL_TABLET | Freq: Every day | ORAL | Status: DC
  Administered 2011-11-19: 20 mg via ORAL
  Filled 2011-11-19 (×2): qty 1

## 2011-11-19 MED ORDER — ACETAMINOPHEN 325 MG PO TABS: 650.0000 mg | ORAL_TABLET | ORAL | Status: DC | PRN

## 2011-11-19 MED ORDER — BIVALIRUDIN 250 MG IV SOLR
INTRAVENOUS | Status: AC
Start: 2011-11-19 — End: 2011-11-19
  Filled 2011-11-19: qty 250

## 2011-11-19 MED ORDER — BIVALIRUDIN 250 MG IV SOLR
INTRAVENOUS | Status: AC
  Filled 2011-11-19: qty 250

## 2011-11-19 MED ORDER — METOPROLOL TARTRATE 12.5 MG HALF TABLET: 12.5000 mg | ORAL_TABLET | Freq: Two times a day (BID) | ORAL | Status: DC

## 2011-11-19 MED ORDER — HEPARIN SODIUM (PORCINE) 5000 UNIT/ML IJ SOLN
5000.0000 [IU] | Freq: Three times a day (TID) | INTRAMUSCULAR | Status: DC
  Administered 2011-11-19 – 2011-11-20 (×3): 5000 [IU] via SUBCUTANEOUS
  Filled 2011-11-19 (×6): qty 1

## 2011-11-19 MED ORDER — SODIUM CHLORIDE 0.9 % IJ SOLN: 3.0000 mL | INTRAMUSCULAR | Status: DC | PRN

## 2011-11-19 MED ORDER — ROSUVASTATIN CALCIUM 10 MG PO TABS: 10.0000 mg | ORAL_TABLET | Freq: Every day | ORAL | Status: DC

## 2011-11-19 MED ORDER — ASPIRIN EC 81 MG PO TBEC: 81.0000 mg | DELAYED_RELEASE_TABLET | Freq: Every day | ORAL | Status: DC

## 2011-11-19 MED ORDER — DIAZEPAM 2 MG PO TABS: 2.0000 mg | ORAL_TABLET | Freq: Four times a day (QID) | ORAL | Status: DC | PRN

## 2011-11-19 MED ORDER — LEVOTHYROXINE SODIUM 125 MCG PO TABS
125.0000 ug | ORAL_TABLET | Freq: Every day | ORAL | Status: DC
  Administered 2011-11-19 – 2011-11-21 (×3): 125 ug via ORAL
  Filled 2011-11-19 (×5): qty 1

## 2011-11-19 MED ORDER — MIDAZOLAM HCL 2 MG/2ML IJ SOLN
INTRAMUSCULAR | Status: AC
  Filled 2011-11-19: qty 2

## 2011-11-19 MED ORDER — SODIUM CHLORIDE 0.9 % IV SOLN: 250.0000 mL | INTRAVENOUS | Status: DC

## 2011-11-19 MED ORDER — NITROGLYCERIN 0.4 MG SL SUBL
SUBLINGUAL_TABLET | SUBLINGUAL | Status: AC
  Administered 2011-11-19: 02:00:00
  Filled 2011-11-19: qty 75

## 2011-11-19 MED ORDER — POTASSIUM CHLORIDE CRYS ER 20 MEQ PO TBCR
EXTENDED_RELEASE_TABLET | ORAL | Status: AC
  Administered 2011-11-19: 06:00:00
  Filled 2011-11-19: qty 2

## 2011-11-19 MED ORDER — CHOLECALCIFEROL 10 MCG (400 UNIT) PO TABS
400.0000 [IU] | ORAL_TABLET | Freq: Two times a day (BID) | ORAL | Status: DC
  Administered 2011-11-19 – 2011-11-22 (×6): 400 [IU] via ORAL
  Filled 2011-11-19 (×8): qty 1

## 2011-11-19 MED ORDER — CLOPIDOGREL BISULFATE 300 MG PO TABS
ORAL_TABLET | ORAL | Status: AC
  Filled 2011-11-19: qty 2

## 2011-11-19 MED ORDER — METOPROLOL TARTRATE 12.5 MG HALF TABLET
12.5000 mg | ORAL_TABLET | Freq: Two times a day (BID) | ORAL | Status: DC
  Administered 2011-11-19 – 2011-11-22 (×7): 12.5 mg via ORAL
  Filled 2011-11-19 (×9): qty 1

## 2011-11-19 MED ORDER — SODIUM CHLORIDE 0.9 % IJ SOLN
3.0000 mL | Freq: Two times a day (BID) | INTRAMUSCULAR | Status: DC
  Administered 2011-11-19 – 2011-11-22 (×6): 3 mL via INTRAVENOUS

## 2011-11-19 MED ORDER — LIDOCAINE HCL (PF) 1 % IJ SOLN
INTRAMUSCULAR | Status: AC
  Filled 2011-11-19: qty 30

## 2011-11-19 MED ORDER — CLOPIDOGREL BISULFATE 75 MG PO TABS: 75.0000 mg | ORAL_TABLET | Freq: Every day | ORAL | Status: DC

## 2011-11-19 MED ORDER — ASPIRIN 81 MG PO CHEW
CHEWABLE_TABLET | ORAL | Status: AC
  Administered 2011-11-19: 01:00:00
  Filled 2011-11-19: qty 4

## 2011-11-19 MED ORDER — NITROGLYCERIN 0.4 MG SL SUBL
0.4000 mg | SUBLINGUAL_TABLET | SUBLINGUAL | Status: DC | PRN
Start: 2011-11-19 — End: 2011-11-22

## 2011-11-19 MED ORDER — CALCITRIOL 0.25 MCG PO CAPS
0.7500 ug | ORAL_CAPSULE | Freq: Two times a day (BID) | ORAL | Status: DC
  Administered 2011-11-19 – 2011-11-22 (×7): 0.75 ug via ORAL
  Filled 2011-11-19 (×10): qty 1

## 2011-11-19 MED ORDER — LEVOTHYROXINE SODIUM 125 MCG PO TABS: 125.0000 ug | ORAL_TABLET | Freq: Every day | ORAL | Status: DC

## 2011-11-19 MED ORDER — HEPARIN (PORCINE) IN NACL 2-0.9 UNIT/ML-% IJ SOLN
INTRAMUSCULAR | Status: AC
  Filled 2011-11-19: qty 2000

## 2011-11-19 MED ORDER — CALCIUM CARBONATE ANTACID 500 MG PO CHEW
8.0000 | CHEWABLE_TABLET | Freq: Every day | ORAL | Status: DC
  Administered 2011-11-19 – 2011-11-22 (×4): 1600 mg via ORAL
  Filled 2011-11-19 (×5): qty 8

## 2011-11-19 MED ORDER — NITROGLYCERIN 0.2 MG/ML ON CALL CATH LAB
INTRAVENOUS | Status: AC
  Filled 2011-11-19: qty 1

## 2011-11-19 MED ORDER — POTASSIUM CHLORIDE CRYS ER 20 MEQ PO TBCR: 40.0000 meq | EXTENDED_RELEASE_TABLET | Freq: Once | ORAL | Status: DC

## 2011-11-19 MED ORDER — ACETAMINOPHEN 325 MG PO TABS
650.0000 mg | ORAL_TABLET | ORAL | Status: DC | PRN
  Administered 2011-11-19 – 2011-11-22 (×3): 650 mg via ORAL
  Filled 2011-11-19 (×3): qty 2

## 2011-11-19 MED ORDER — FAMOTIDINE 20 MG PO TABS
20.0000 mg | ORAL_TABLET | ORAL | Status: AC
  Administered 2011-11-19: 20 mg via ORAL
  Filled 2011-11-19: qty 1

## 2011-11-19 MED ORDER — SODIUM CHLORIDE 0.9 % IV SOLN
INTRAVENOUS | Status: AC
  Administered 2011-11-19: 05:00:00 via INTRAVENOUS

## 2011-11-19 MED ORDER — ONDANSETRON HCL 4 MG/2ML IJ SOLN: 4.0000 mg | Freq: Four times a day (QID) | INTRAMUSCULAR | Status: DC | PRN

## 2011-11-19 MED ORDER — CLOPIDOGREL BISULFATE 75 MG PO TABS
75.0000 mg | ORAL_TABLET | Freq: Every day | ORAL | Status: DC
  Administered 2011-11-19 – 2011-11-22 (×4): 75 mg via ORAL
  Filled 2011-11-19 (×6): qty 1

## 2011-11-19 MED ORDER — FENTANYL CITRATE 0.05 MG/ML IJ SOLN
INTRAMUSCULAR | Status: AC
  Filled 2011-11-19: qty 2

## 2011-11-19 MED ORDER — ASPIRIN 81 MG PO CHEW
81.0000 mg | CHEWABLE_TABLET | Freq: Every day | ORAL | Status: DC
  Administered 2011-11-19 – 2011-11-22 (×4): 81 mg via ORAL
  Filled 2011-11-19 (×5): qty 1

## 2011-11-19 MED ORDER — HEPARIN SODIUM (PORCINE) 5000 UNIT/ML IJ SOLN
INTRAMUSCULAR | Status: AC
  Administered 2011-11-19: 5000 [IU] via SUBCUTANEOUS
  Filled 2011-11-19: qty 1

## 2011-11-19 MED ORDER — MORPHINE SULFATE 2 MG/ML IJ SOLN: 1.0000 mg | INTRAMUSCULAR | Status: DC | PRN

## 2011-11-19 MED ORDER — FAMOTIDINE 20 MG PO TABS
20.0000 mg | ORAL_TABLET | Freq: Every day | ORAL | Status: DC
  Filled 2011-11-19: qty 1

## 2011-11-19 NOTE — H&P (Signed)
Primary Cardiologist: Dietrich Pates [facility])  Chief Complaint: chest pain  HPI: Ms. Lynn is a 65 year old woman with hyperlipidemia and hypertension who presents with acute chest pain.   Her pain began in the middle of the day around noon on Sunday (11/25), described as central chest tightness, non-radiating, unassociated with any other symptoms (no nausea, diaphoresis, dyspnea). It occurred at rest and has continued for several hours, rated at a 5/10 at its worst. She denies palpitations or lightheadedness. No syncope.   Upon presentation to the [facility] ED, an ECG was performed, demonstrating ST segment abnormalities prompting the ED physicians to activate a code STEMI. After review of her ECG by cardiology team, it was determined that she did not meet technical criteria for a STEMI. However, on the basis of her ongoing chest pain despite IV Heparin and SL NTG, together with a POC Troponin of 0.58, she was brought to the cath lab for coronary angiography and possible intervention.  Past Medical History  Diagnosis Date  . GERD (gastroesophageal reflux disease)     occasional  . Hyperlipidemia   . Hypertension   . Thyroid disease     PRIMARY HYPOPARATHYROIDISM  . Vertigo   . Tubular adenoma of colon 06/2011  . Colitis     Past Surgical History  Procedure Date  . Colonoscopy Aenomatous polyps    07/05/2011  . Total thyroidectomy 2007    GOITER  . Shoulder arthroscopy w/ rotator cuff repair     right    Family History  Problem Relation Age of Onset  . Colon cancer Brother    Social History: Non-smoker No EtOH No illicits   Allergies: No Known Allergies  Medications Prior to Admission  Medication Dose Route Frequency Provider Last Rate Last Dose  . aspirin 81 MG chewable tablet           . heparin 5000 UNIT/ML injection        5,000 Units at 11/19/11 0052  . nitroGLYCERIN (NITROSTAT) 0.4 MG SL tablet            Medications Prior to Admission  Medication Sig Dispense  Refill  . amLODipine (NORVASC) 10 MG tablet Take 10 mg by mouth daily.        Marland Kitchen atorvastatin (LIPITOR) 40 MG tablet Take 40 mg by mouth daily.        . calcitRIOL (ROCALTROL) 0.25 MCG capsule Take 0.75 mcg by mouth 2 (two) times daily.        . calcium carbonate (TUMS - DOSED IN MG ELEMENTAL CALCIUM) 500 MG chewable tablet Chew 8 tablets by mouth daily.        Marland Kitchen levothyroxine (SYNTHROID, LEVOTHROID) 125 MCG tablet Take 125 mcg by mouth daily.          Exam Afebrile HR 80 BP 119/60 RR 14 98% RA GEN no acute distress, no accessory muscle use NECK supple, no JVD CHEST clear to auscultation bilaterally CARDIAC S1 S2, regular, no rubs, II/VI systolic flow murmur LUSB ABDOMEN soft, non-tender, non-distended EXTREMITIES warm, well-perfused, no edema, 2+ DP pulses bilaterally SKIN warm & dry NEURO A&Ox3, no focal deficits detected   Results for orders placed during the hospital encounter of 11/19/11 (from the past 48 hour(s))  POCT I-STAT TROPONIN I     Status: Abnormal   Collection Time   11/19/11 12:56 AM      Component Value Range Comment   Troponin i, poc 0.58 (*) 0.00 - 0.08 (ng/mL)    Comment  NOTIFIED PHYSICIAN      Comment 3            CBC     Status: Abnormal   Collection Time   11/19/11 12:58 AM      Component Value Range Comment   WBC 11.6 (*) 4.0 - 10.5 (K/uL)    RBC 4.53  3.87 - 5.11 (MIL/uL)    Hemoglobin 13.1  12.0 - 15.0 (g/dL)    HCT 16.1  09.6 - 04.5 (%)    MCV 84.8  78.0 - 100.0 (fL)    MCH 28.9  26.0 - 34.0 (pg)    MCHC 34.1  30.0 - 36.0 (g/dL)    RDW 40.9  81.1 - 91.4 (%)    Platelets 275  150 - 400 (K/uL)   DIFFERENTIAL     Status: Abnormal   Collection Time   11/19/11 12:58 AM      Component Value Range Comment   Neutrophils Relative 50  43 - 77 (%)    Neutro Abs 5.8  1.7 - 7.7 (K/uL)    Lymphocytes Relative 37  12 - 46 (%)    Lymphs Abs 4.3 (*) 0.7 - 4.0 (K/uL)    Monocytes Relative 10  3 - 12 (%)    Monocytes Absolute 1.1 (*) 0.1 - 1.0 (K/uL)     Eosinophils Relative 3  0 - 5 (%)    Eosinophils Absolute 0.4  0.0 - 0.7 (K/uL)    Basophils Relative 1  0 - 1 (%)    Basophils Absolute 0.1  0.0 - 0.1 (K/uL)   PROTIME-INR     Status: Normal   Collection Time   11/19/11 12:58 AM      Component Value Range Comment   Prothrombin Time 12.2  11.6 - 15.2 (seconds)    INR 0.89  0.00 - 1.49    APTT     Status: Normal   Collection Time   11/19/11 12:58 AM      Component Value Range Comment   aPTT 32  24 - 37 (seconds)    Assessment 65 year old woman with coronary risk factors who presents with chest pain in the setting of an NSTEMI. On the basis of her ongoing chest pain despite IV Heparin and SL NTG, together with positive POC biomarkers, she was brought to the cath lab where she was found to have a mid-RCA sub-total occlusion. She underwent PCI by Dr. Riley Kill with restoration of TIMI III flow to the distal vessel. In the course of the stented portion of the RCA, there is a side branch of the RCA that has an ostial narrowing which for now will be medically managed. She will be transferred to the CICU for further management.  Plan: 1) NSTEMI due to subtotal occlusion in mid-RCA, now s/p PCI to RCA - CICU monitoring and groin site care per protocol (sealing device deployed to R groin in cath lab following PCI) - Obligatory dual anti-platelet therapy in the form of ASA (lifelong) and Clopidogrel (atleast one year) - Statin therapy for aggressive LDL goal (optimally < 70) - TTE for formal assessment of overall left ventricular systolic function as well as regional wall motion assessment after NSTEMI and RCA stenting - Medical management of her remaining coronary disease  2)Aggressive coronary risk factor modification, including:  Hypertension - Beta blockade and ACE-I as tolerated by her BP - Previously on amlodipine 10mg  as a daily dose. Will hold in the short term and add back as needed  depending on her BP trend while in the hospital.    Hyperlipidemia - Statin therapy for aggressive LDL goal (optimally < 70)  3) Hypothyroidism due to primary hypoparathyroidism - Continue thyroid hormone replacement and calcitriol therapy  - Will check a TSH here.   4) Renal: normal renal function s/p 225cc of contrast during PCI - Follow urine output and renal function  - Maintain hydration  Plan explained to the patient and her family. Their questions were answered to the best of my ability.  Zacarias Pontes, M.D. Cardiology Fellow On-Call

## 2011-11-19 NOTE — Progress Notes (Signed)
  Echocardiogram 2D Echocardiogram has been performed.  Cathie Beams Deneen 11/19/2011, 4:22 PM

## 2011-11-19 NOTE — ED Notes (Signed)
Pt reports intermittent CP x couple of days.  Reports having CP tonight that felt like something was sitting on her chest.  States that she took asa PTA about 3 hours ago.  Skin warm, dry and intact.  Neuro intact.  Denies N/V/SOB/diaphoresis.

## 2011-11-19 NOTE — Progress Notes (Signed)
Pt chest pain free. Echo just done.  LVEF is normal with no definite wall motion abnormalities.  Ck/Trop bump so far is small I spoke with patient and family.  She has had difficulties tolerating statins as outpatient due to muscle aches.  Will try adding Vit D to regimen.  May need to back down on dose or go to qod.  Lipids have not been good. I will write a note to get short term disability (works at [employer], long shifts).   Probable tx to floor in AM.  She should benefit from cardiac rehab.

## 2011-11-19 NOTE — Progress Notes (Signed)
Patient [name], 65 year old female, is a native of [place] now living in [place], [place].  Patient arrived at emergency room complaining of chest pains over the past 2 days.  Chaplain provided pastoral presence, prayer, and conversation to patient, her husband, and her son in Emergency Room A 2 Pod area.  When patient was moved to the cath lab, chaplain continued to support patient's family  in consultation room #2 outside the cath lab on the 2nd floor.  Dr. Shawnie Pons reported a small blockage of an artery, which he proceeded to repair.  Patient's family expressed appreciation for presence of chaplain.  Will follow-up as needed.

## 2011-11-19 NOTE — Progress Notes (Signed)
Subjective:  She reports some chest tightness but it's better than what she came in with. She feels that hse is having acid reflux.  Objective:  Vital Signs in the last 24 hours: Temp:  [97.8 F (36.6 C)-98.7 F (37.1 C)] 97.8 F (36.6 C) (11/26 0445) Pulse Rate:  [52-77] 54  (11/26 0600) Resp:  [13-19] 15  (11/26 0600) BP: (113-165)/(54-75) 120/59 mmHg (11/26 0600) SpO2:  [100 %] 100 % (11/26 0600) Weight:  [196 lb (88.905 kg)-200 lb (90.719 kg)] 196 lb (88.905 kg) (11/26 0445)  Intake/Output from previous day: 11/25 0701 - 11/26 0700 In: 120 [P.O.:120] Out: 600 [Urine:600]  Physical Exam: Pt is alert and oriented, NAD HEENT: normal Neck: JVP - normal, carotids 2+= without bruits Lungs: CTA bilaterally CV: RRR without murmur or gallop Abd: soft, NT, Positive BS, no hepatomegaly Ext: no C/C/E, distal pulses intact and equal Skin: warm/dry no rash  Current Facility-Administered Medications  Medication Dose Route Frequency Provider Last Rate Last Dose  . 0.9 %  sodium chloride infusion  250 mL Intravenous Continuous Zacarias Pontes      . 0.9 %  sodium chloride infusion   Intravenous Continuous Shawnie Pons, MD 150 mL/hr at 11/19/11 0600    . acetaminophen (TYLENOL) tablet 650 mg  650 mg Oral Q4H PRN Zacarias Pontes      . aspirin 81 MG chewable tablet           . aspirin chewable tablet 81 mg  81 mg Oral Daily Shawnie Pons, MD      . calcitRIOL (ROCALTROL) capsule 0.75 mcg  0.75 mcg Oral BID Zacarias Pontes      . calcium carbonate (TUMS - dosed in mg elemental calcium) chewable tablet 1,600 mg of elemental calcium  8 tablet Oral Daily Zacarias Pontes      . clopidogrel (PLAVIX) 300 MG tablet           . clopidogrel (PLAVIX) tablet 75 mg  75 mg Oral Q breakfast Shawnie Pons, MD      . diazepam (VALIUM) tablet 2 mg  2 mg Oral Q6H PRN Shawnie Pons, MD      . fentaNYL (SUBLIMAZE) 0.05 MG/ML injection           . heparin 2-0.9 UNIT/ML-% infusion           . heparin 5000 UNIT/ML  injection        5,000 Units at 11/19/11 0052  . heparin injection 5,000 Units  5,000 Units Subcutaneous Q8H Zacarias Pontes      . levothyroxine (SYNTHROID, LEVOTHROID) tablet 125 mcg  125 mcg Oral QAC breakfast Shawnie Pons, MD      . lidocaine (XYLOCAINE) 1 % injection           . metoprolol tartrate (LOPRESSOR) tablet 12.5 mg  12.5 mg Oral BID Shawnie Pons, MD      . midazolam (VERSED) 2 MG/2ML injection           . morphine 2 MG/ML injection 1 mg  1 mg Intravenous Q2H PRN Shawnie Pons, MD      . nitroGLYCERIN (NITROSTAT) 0.4 MG SL tablet           . nitroGLYCERIN (NITROSTAT) SL tablet 0.4 mg  0.4 mg Sublingual Q5 min PRN Zacarias Pontes      . nitroGLYCERIN (NTG ON-CALL) 0.2 mg/mL injection           . ondansetron (ZOFRAN) injection 4 mg  4 mg Intravenous Q6H PRN Zacarias Pontes      .  potassium chloride SA (K-DUR,KLOR-CON) CR tablet 40 mEq  40 mEq Oral Once Shawnie Pons, MD      . rosuvastatin (CRESTOR) tablet 20 mg  20 mg Oral Daily Shawnie Pons, MD      . sodium chloride 0.9 % injection 3 mL  3 mL Intravenous Q12H Zacarias Pontes      . sodium chloride 0.9 % injection 3 mL  3 mL Intravenous PRN Zacarias Pontes      . DISCONTD: acetaminophen (TYLENOL) tablet 650 mg  650 mg Oral Q4H PRN Shawnie Pons, MD      . DISCONTD: aspirin EC tablet 81 mg  81 mg Oral Daily Zacarias Pontes      . DISCONTD: clopidogrel (PLAVIX) tablet 75 mg  75 mg Oral Q breakfast Zacarias Pontes      . DISCONTD: levothyroxine (SYNTHROID, LEVOTHROID) tablet 125 mcg  125 mcg Oral Daily Zacarias Pontes      . DISCONTD: metoprolol tartrate (LOPRESSOR) tablet 12.5 mg  12.5 mg Oral BID Zacarias Pontes      . DISCONTD: rosuvastatin (CRESTOR) tablet 10 mg  10 mg Oral Daily Zacarias Pontes          Lab Results:  [facility] 11/19/11 0058  WBC 11.6*  HGB 13.1  PLT 275    Basename 11/19/11 0058  NA 143  K 3.3*  CL 103  CO2 30  GLUCOSE 88  BUN 23  CREATININE 1.08    Basename 11/19/11 0058  TROPONINI 0.47*    Imaging: No new  imaging  Cardiac Studies: 11/19/11  EKG: normal axis, regular rate, non- specific ST- T wave changes in leads II,III and aVF. 11/19/11 Cardiac cath: Left mainstem: no significant obstruction.   Left anterior descending (LAD): moderate calcification with 30% segmental plaque proximally, and diffuse luminal irregularities without high grade obstruction.  Left circumflex (LCx): Large vessel. 60% ostial OM-1, with 30% of the AV circ beyond OM1. There is an additional 30% beyond OM2. The distal vessel bifurcates in to a bifurcating OM3.   Right coronary artery (RCA): Smaller in caliber with 60 and 70% tandem lesions in proximal portion of mid vessel. The there was 99% stenosis just before the AV portion of the RCA and the moderate size AM branch. Overlapping Promus Element stents placed across the AM branch with reduction to 0% following post dilatation. The distal RCA was a small PDA and PLA branch. The AM branch, with its exit from the stent has an 80-90% narrowing, but with Timi 3 flow.   Assessment/Plan:   65 year old woman with hyperlipidemia and hypertension who presents with acute chest pain on 11/26. Her pain began in the middle of the day around noon on Sunday (11/25), that occurred at rest and continued for several hours when she decided to seek medical attention. Upon presentation to [facility] ER , due to ST- T segment abnormalities, code STEMI was activated.  1. STEMI: due to subtotal occlusion in mid-RCA, now s/p PCI to RCA with 2 DES She still reports some chest tightness but is much better than her admission. Denies nay SOB, nausea or vomiting. Her groin site appears healthy. On exam it's mildly tender, no hematoma seen or bruit heard. Plan: Continue dual anti- platelet therapy.          Continue statin, BB- blocker.          Symptomatic treatment with PPI for acid reflux.            2.Hypertension : BP stable.   -  Continue BB- blocker.    -Previously on amlodipine 10mg  as a  daily dose. Will hold in the short term and add back as needed depending on her BP trend while in the hospital.   3.Hyperlipidemia  - Statin therapy for aggressive LDL goal (optimally < 70).  4.Hypothyroidism due to primary hypoparathyroidism : TSH levels pending. - Continue thyroid hormone replacement and calcitriol therapy.         SAWHNEY,MEGHA, M.D. 11/19/2011, 6:32 AM    Patient seen and examined.  Reviewed with Dr. Dorthula Rue.  She has some retrosternal burning.  Vital signs are stable.  Groin site looks good.  No rub.  ECG demonstrates SB and WNL.  Subtle inferior ST changes are unchanged from admission.  Anatomy reviewed with Dr. [name].  Will lean toward continued medical approach.  She does have an acute marginal branch with narrowing, but flow was always TIMI 3 in the this branch, and would be preferable to avoid bifurcation stenting or dilatation if possible given the small stent size in the parent vessel in particular.  Will add Pepcid and continue to monitor her.  Will check P2 Y12 later today.    Shawnie Pons, MD, [facility], [facility] 11/19/2011 7:47 AM

## 2011-11-19 NOTE — ED Provider Notes (Signed)
History     CSN: [id number] Arrival date & time: 11/19/2011 12:27 AM   First MD Initiated Contact with Patient 11/19/11 0040      Chief Complaint  Patient presents with  . Chest Pain    (Consider location/radiation/quality/duration/timing/severity/associated sxs/prior treatment) Patient is a 65 y.o. female presenting with chest pain.  Chest Pain    Patient complains of on again off again chest discomfort which began today around noon.  Patient presents emergency department with complaint of heaviness across her precordium.  Patient denies diaphoresis, nausea, vomiting, or shortness of breath.  Patient has a previous history of hypertension.  She also has GERD.  Patient also has elevated cholesterol.   Past Medical History  Diagnosis Date  . GERD (gastroesophageal reflux disease)     occasional  . Hyperlipidemia   . Hypertension   . Thyroid disease     PRIMARY HYPOPARATHYROIDISM  . Vertigo   . Tubular adenoma of colon 06/2011  . Colitis     Past Surgical History  Procedure Date  . Colonoscopy Aenomatous polyps    07/05/2011  . Total thyroidectomy 2007    GOITER  . Shoulder arthroscopy w/ rotator cuff repair     right    Family History  Problem Relation Age of Onset  . Colon cancer Brother     History  Substance Use Topics  . Smoking status: Never Smoker   . Smokeless tobacco: Never Used  . Alcohol Use: No    OB History    Grav Para Term Preterm Abortions TAB SAB Ect Mult Living                  Review of Systems  Cardiovascular: Positive for chest pain.   Remaining review of systems is negative except for those mentioned in history of present illness Allergies  Review of patient's allergies indicates no known allergies.  Home Medications   Current Outpatient Rx  Name Route Sig Dispense Refill  . AMLODIPINE BESYLATE 10 MG PO TABS Oral Take 10 mg by mouth daily.      . ASPIRIN EC 325 MG PO TBEC Oral Take 325 mg by mouth every 6 (six) hours as  needed. For pain.     . ATORVASTATIN CALCIUM 40 MG PO TABS Oral Take 40 mg by mouth daily.      Marland Kitchen CALCITRIOL 0.25 MCG PO CAPS Oral Take 0.75 mcg by mouth 2 (two) times daily.      Marland Kitchen CALCIUM CARBONATE ANTACID 500 MG PO CHEW Oral Chew 8 tablets by mouth daily.      Marland Kitchen LEVOTHYROXINE SODIUM 125 MCG PO TABS Oral Take 125 mcg by mouth daily.        BP 165/75  Pulse 77  Resp 13  SpO2 100%  Physical Exam  Nursing note and vitals reviewed. Constitutional: She is oriented to person, place, and time. She appears well-developed and well-nourished. No distress.  HENT:  Head: Normocephalic and atraumatic.  Eyes: Pupils are equal, round, and reactive to light.  Neck: Normal range of motion.  Cardiovascular: Normal rate and intact distal pulses.  Exam reveals friction rub. Exam reveals no gallop.   No murmur heard.        Date: 11/19/2011  Rate: 61  Rhythm: normal sinus rhythm  QRS Axis: normal  Intervals: normal  ST/T Wave abnormalities: ST elevations inferiorly  Conduction Disutrbances:none:   Old EKG Reviewed: changes noted inferior ST segments looked mildly elevated when compared to previous tracing  Pulmonary/Chest: No respiratory distress.  Abdominal: Normal appearance. She exhibits no distension.  Musculoskeletal: Normal range of motion.  Neurological: She is alert and oriented to person, place, and time. No cranial nerve deficit.  Skin: Skin is warm and dry. No rash noted.  Psychiatric: She has a normal mood and affect. Her behavior is normal.    ED Course  Procedures (including critical care time) CRITICAL CARE Performed by: Nelva Nay L   Total critical care time: 30 Critical care time was exclusive of separately billable procedures and treating other patients.  Critical care was necessary to treat or prevent imminent or life-threatening deterioration.  Critical care was time spent personally by me on the following activities: development of treatment plan with  patient and/or surrogate as well as nursing, discussions with consultants, evaluation of patient's response to treatment, examination of patient, obtaining history from patient or surrogate, ordering and performing treatments and interventions, ordering and review of laboratory studies, ordering and review of radiographic studies, pulse oximetry and re-evaluation of patient's condition.    Code STEMI called  Labs Reviewed  CBC - Abnormal; Notable for the following:    WBC 11.6 (*)    All other components within normal limits  DIFFERENTIAL - Abnormal; Notable for the following:    Lymphs Abs 4.3 (*)    Monocytes Absolute 1.1 (*)    All other components within normal limits  BASIC METABOLIC PANEL - Abnormal; Notable for the following:    Potassium 3.3 (*)    GFR calc non Af Amer 53 (*)    GFR calc Af Amer 62 (*)    All other components within normal limits  POCT I-STAT TROPONIN I - Abnormal; Notable for the following:    Troponin i, poc 0.58 (*)    All other components within normal limits  CARDIAC PANEL(CRET KIN+CKTOT+MB+TROPI) - Abnormal; Notable for the following:    CK, MB 7.1 (*)    Troponin I 0.47 (*)    Relative Index 5.3 (*)    All other components within normal limits  CARDIAC PANEL(CRET KIN+CKTOT+MB+TROPI) - Abnormal; Notable for the following:    Total CK 196 (*)    CK, MB 19.7 (*) CRITICAL VALUE NOTED.  VALUE IS CONSISTENT WITH PREVIOUSLY REPORTED AND CALLED VALUE.   Troponin I 1.84 (*)    Relative Index 10.1 (*)    All other components within normal limits  CARDIAC PANEL(CRET KIN+CKTOT+MB+TROPI) - Abnormal; Notable for the following:    Total CK 211 (*)    CK, MB 21.2 (*) CRITICAL VALUE NOTED.  VALUE IS CONSISTENT WITH PREVIOUSLY REPORTED AND CALLED VALUE.   Troponin I 2.65 (*)    Relative Index 10.0 (*)    All other components within normal limits  CBC - Abnormal; Notable for the following:    Hemoglobin 11.9 (*)    HCT 35.6 (*)    All other components within  normal limits  CREATININE, SERUM - Abnormal; Notable for the following:    GFR calc non Af Amer 58 (*)    GFR calc Af Amer 67 (*)    All other components within normal limits  PROTIME-INR  APTT  MRSA PCR SCREENING  LIPID PANEL  CBC  BASIC METABOLIC PANEL  TSH  PLATELET INHIBITION P2Y12   No results found.   1. STEMI (ST elevation myocardial infarction)       MDM          Nelia Shi, MD 11/19/11 1358

## 2011-11-19 NOTE — Op Note (Signed)
Cardiac Catheterization Procedure Note  Name: [name] MRN: [id number] DOB: 1946/08/06  Procedure: Placement of catheters without left heart cath, Selective Coronary Angiography,  PTCA/Stent of RCA times 2 with DES  Indication: Non STEMI.  Patient presented with stuttering chest pain, pos troponins, and worrisome but non diagnostic ECG.  Urgent cath with possible PCI was recommended.  I explained to the patient her findings and our recommendations.     Diagnostic Procedure Details: The right groin was prepped, draped, and anesthetized with 1% lidocaine. Using the modified Seldinger technique and SMART NEEDLE, a 6 French sheath was introduced into the right femoral artery. Standard Judkins catheters were used for selective coronary angiography.  LV angio was not done because of complex PCI requiring 2 stents, and fluoro time, with low risk MI noted  Echo was planned as part of post procedure care. Catheter exchanges were performed over a wire.  The diagnostic procedure was well-tolerated without immediate complications.  RCA PCI was performed using a JR4 with side holes, bivalirudin, and 600mg  clopidogrel load.  Overlapping Promus Element stents were placed, the distal with 2.73mm by 20mm, and the proximal with 2.5 by 16 mm.  Double wires were required to pass stents and also to pass post dilatation balloons.  Post dil inflations were done with [place] balloons to high pressures, up to 16 atm.  Predil was done initially with a 1.5 mm BSC balloon.  At the completion of the procedure, she was pain free, and there was TIMI 3 flow.  There was a large acute marginal with ostial narrowing in its exit from the stent, but TIMI 3 flow, so bifurcation stenting, and dilatation through the stent was not performed.  We reprepped the groin, and gloves were exchanged. A 51F angioseal device was deployed without complication, with good hemostasis.   PROCEDURAL FINDINGS Hemodynamics: AO 150/60 (92) LV not done to limit  fluoro time  Coronary angiography: Coronary dominance: right  Left mainstem: no significant obstruction.  Left anterior descending (LAD): moderate calcification with 30% segmental plaque proximally, and diffuse luminal irregularities without high grade obstruction  Left circumflex (LCx): Large vessel.  60% ostial OM-1, with 30% of the AV circ beyond OM1.  There is an additional 30% beyond OM2.  The distal vessel bifurcates in to a bifurcating OM3.  Right coronary artery (RCA): Smaller in caliber with 60 and 70% tandem lesions in proximal portion of mid vessel.  The there was 99% stenosis just before the AV portion of the RCA and the moderate size AM branch.  Overlapping Promus Element stents placed across the AM branch with reduction to 0% following post dilatation.  The distal RCA was a small PDA and PLA branch.  The AM branch, with its exit from the stent has an 80-90% narrowing, but with Timi 3 flow.    Left ventriculography:Not performed.   PCI Data: Vessel - RCA/Segment - 2 Percent Stenosis (pre)  99 TIMI-flow 2 Stent Promus Element times two  (total length 36mm) Percent Stenosis (post) 0 TIMI-flow (post) 3  Final Conclusions:  Non STEMI with stuttering chest pain, and subtotal RCA treated with overlapping DES stents  Recommendations: ASA/Plavix/BB/statin/cardiac rehab.  Shawnie Pons 11/19/2011, 4:35 AM

## 2011-11-19 NOTE — ED Notes (Signed)
C/o pain in center of chest that radiates to L arm since noon.  Denies sob, nausea, and vomiting.

## 2011-11-20 ENCOUNTER — Encounter (HOSPITAL_COMMUNITY): Payer: Self-pay

## 2011-11-20 DIAGNOSIS — I214 Non-ST elevation (NSTEMI) myocardial infarction: Secondary | ICD-10-CM

## 2011-11-20 DIAGNOSIS — I252 Old myocardial infarction: Secondary | ICD-10-CM | POA: Diagnosis present

## 2011-11-20 LAB — CBC
MCH: 28.1 pg (ref 26.0–34.0)
MCHC: 33.2 g/dL (ref 30.0–36.0)
MCV: 85 fL (ref 78.0–100.0)
Platelets: 259 10*3/uL (ref 150–400)
RBC: 4.33 MIL/uL (ref 3.87–5.11)
RDW: 14.4 % (ref 11.5–15.5)
RDW: 14.5 % (ref 11.5–15.5)
WBC: 9.4 10*3/uL (ref 4.0–10.5)

## 2011-11-20 LAB — BASIC METABOLIC PANEL
BUN: 19 mg/dL (ref 6–23)
CO2: 26 mEq/L (ref 19–32)
Calcium: 7.8 mg/dL — ABNORMAL LOW (ref 8.4–10.5)
Chloride: 106 mEq/L (ref 96–112)
GFR calc Af Amer: 67 mL/min — ABNORMAL LOW (ref >90)
GFR calc non Af Amer: 58 mL/min — ABNORMAL LOW (ref >90)
Potassium: 3.3 mEq/L — ABNORMAL LOW (ref 3.5–5.1)
Potassium: 3.3 mEq/L — ABNORMAL LOW (ref 3.5–5.1)
Sodium: 141 mEq/L (ref 135–145)
Sodium: 141 mEq/L (ref 135–145)

## 2011-11-20 LAB — MAGNESIUM: Magnesium: 1.9 mg/dL (ref 1.5–2.5)

## 2011-11-20 LAB — LIPID PANEL
HDL: 37 mg/dL — ABNORMAL LOW (ref >39)
Triglycerides: 189 mg/dL — ABNORMAL HIGH (ref <150)

## 2011-11-20 MED ORDER — POTASSIUM CHLORIDE CRYS ER 20 MEQ PO TBCR
40.0000 meq | EXTENDED_RELEASE_TABLET | Freq: Two times a day (BID) | ORAL | Status: DC
  Administered 2011-11-20 (×2): 40 meq via ORAL
  Filled 2011-11-20 (×4): qty 2

## 2011-11-20 MED ORDER — ROSUVASTATIN CALCIUM 10 MG PO TABS
10.0000 mg | ORAL_TABLET | Freq: Every day | ORAL | Status: DC
  Administered 2011-11-20 – 2011-11-22 (×3): 10 mg via ORAL
  Filled 2011-11-20 (×3): qty 1

## 2011-11-20 NOTE — Progress Notes (Signed)
Subjective:  She denies any chest pain this AM. Also denies any SOB, nausea or vomiting.  Objective:  Vital Signs in the last 24 hours: Temp:  [98 F (36.7 C)-98.4 F (36.9 C)] 98.4 F (36.9 C) (11/27 0400) Pulse Rate:  [55-72] 62  (11/27 0600) Resp:  [10-24] 15  (11/27 0600) BP: (94-133)/(46-71) 114/49 mmHg (11/27 0600) SpO2:  [94 %-100 %] 97 % (11/27 0600)  Intake/Output from previous day: 11/26 0701 - 11/27 0700 In: 960 [P.O.:960] Out: 3500 [Urine:3500]  Physical Exam: Pt is alert and oriented, NAD HEENT: normal Neck: JVP - normal, carotids 2+= without bruits Lungs: CTA bilaterally CV: RRR without murmur or gallop Abd: soft, NT, Positive BS, no hepatomegaly Groin: right groin has small hematoma at cath site, no pulsatile bruit heard. Ext: no C/C/E, distal pulses intact and equal Skin: warm/dry no rash  Current Facility-Administered Medications  Medication Dose Route Frequency Provider Last Rate Last Dose  . 0.9 %  sodium chloride infusion  250 mL Intravenous Continuous Zacarias Pontes      . 0.9 %  sodium chloride infusion   Intravenous Continuous Shawnie Pons, MD 150 mL/hr at 11/19/11 0600    . acetaminophen (TYLENOL) tablet 650 mg  650 mg Oral Q4H PRN Zacarias Pontes      . aspirin 81 MG chewable tablet           . aspirin chewable tablet 81 mg  81 mg Oral Daily Shawnie Pons, MD      . calcitRIOL (ROCALTROL) capsule 0.75 mcg  0.75 mcg Oral BID Zacarias Pontes      . calcium carbonate (TUMS - dosed in mg elemental calcium) chewable tablet 1,600 mg of elemental calcium  8 tablet Oral Daily Zacarias Pontes      . clopidogrel (PLAVIX) 300 MG tablet           . clopidogrel (PLAVIX) tablet 75 mg  75 mg Oral Q breakfast Shawnie Pons, MD      . diazepam (VALIUM) tablet 2 mg  2 mg Oral Q6H PRN Shawnie Pons, MD      . fentaNYL (SUBLIMAZE) 0.05 MG/ML injection           . heparin 2-0.9 UNIT/ML-% infusion           . heparin 5000 UNIT/ML injection        5,000 Units at 11/19/11 0052    . heparin injection 5,000 Units  5,000 Units Subcutaneous Q8H Zacarias Pontes      . levothyroxine (SYNTHROID, LEVOTHROID) tablet 125 mcg  125 mcg Oral QAC breakfast Shawnie Pons, MD      . lidocaine (XYLOCAINE) 1 % injection           . metoprolol tartrate (LOPRESSOR) tablet 12.5 mg  12.5 mg Oral BID Shawnie Pons, MD      . midazolam (VERSED) 2 MG/2ML injection           . morphine 2 MG/ML injection 1 mg  1 mg Intravenous Q2H PRN Shawnie Pons, MD      . nitroGLYCERIN (NITROSTAT) 0.4 MG SL tablet           . nitroGLYCERIN (NITROSTAT) SL tablet 0.4 mg  0.4 mg Sublingual Q5 min PRN Zacarias Pontes      . nitroGLYCERIN (NTG ON-CALL) 0.2 mg/mL injection           . ondansetron (ZOFRAN) injection 4 mg  4 mg Intravenous Q6H PRN Zacarias Pontes      . potassium chloride SA (  K-DUR,KLOR-CON) CR tablet 40 mEq  40 mEq Oral Once Shawnie Pons, MD      . rosuvastatin (CRESTOR) tablet 20 mg  20 mg Oral Daily Shawnie Pons, MD      . sodium chloride 0.9 % injection 3 mL  3 mL Intravenous Q12H Zacarias Pontes      . sodium chloride 0.9 % injection 3 mL  3 mL Intravenous PRN Zacarias Pontes      . DISCONTD: acetaminophen (TYLENOL) tablet 650 mg  650 mg Oral Q4H PRN Shawnie Pons, MD      . DISCONTD: aspirin EC tablet 81 mg  81 mg Oral Daily Zacarias Pontes      . DISCONTD: clopidogrel (PLAVIX) tablet 75 mg  75 mg Oral Q breakfast Zacarias Pontes      . DISCONTD: levothyroxine (SYNTHROID, LEVOTHROID) tablet 125 mcg  125 mcg Oral Daily Zacarias Pontes      . DISCONTD: metoprolol tartrate (LOPRESSOR) tablet 12.5 mg  12.5 mg Oral BID Zacarias Pontes      . DISCONTD: rosuvastatin (CRESTOR) tablet 10 mg  10 mg Oral Daily Zacarias Pontes          Lab Results:  Basename 11/20/11 0555 11/20/11 0004  WBC 9.5 9.4  HGB 12.2 12.1  PLT 247 259    Basename 11/20/11 0004 11/19/11 0903 11/19/11 0058  NA 141 -- 143  K 3.3* -- 3.3*  CL 106 -- 103  CO2 26 -- 30  GLUCOSE 99 -- 88  BUN 20 -- 23  CREATININE 1.05 1.00 --    Basename  11/19/11 1123 11/19/11 0904  TROPONINI 2.65* 1.84*    Imaging: No new imaging  Cardiac Studies: 11/20/11 EKG: normal sinus rhythm, T wave inversions in leads II, III and aVF. 11/19/11  EKG: normal axis, regular rate,  Persistent but decreased ST-elevation in leads II, and aVF, V5 and V6. 11/19/11 Cardiac cath: Left mainstem: no significant obstruction.   Left anterior descending (LAD): moderate calcification with 30% segmental plaque proximally, and diffuse luminal irregularities without high grade obstruction.  Left circumflex (LCx): Large vessel. 60% ostial OM-1, with 30% of the AV circ beyond OM1. There is an additional 30% beyond OM2. The distal vessel bifurcates in to a bifurcating OM3.   Right coronary artery (RCA): Smaller in caliber with 60 and 70% tandem lesions in proximal portion of mid vessel. The there was 99% stenosis just before the AV portion of the RCA and the moderate size AM branch. Overlapping Promus Element stents placed across the AM branch with reduction to 0% following post dilatation. The distal RCA was a small PDA and PLA branch. The AM branch, with its exit from the stent has an 80-90% narrowing, but with Timi 3 flow.   Assessment/Plan:   65 year old woman with hyperlipidemia and hypertension who presents with acute chest pain on 11/26.Her pain began in the middle of the day around noon on Sunday (11/25), that occurred at rest and continued for several hours when she decided to seek medical attention. Upon presentation to [facility] ER , due to ST- T segment abnormalities, code STEMI was activated.  1. STEMI: due to subtotal occlusion in mid-RCA, now s/p PCI to RCA with 2 DES on 11/26. She denies any chest pain, SOB, nausea or vomiting. Her groin site appears healthy. On exam it's mildly tender, small hematoma was noticed but no bruit heard. Plan: Continue dual anti- platelet therapy with ASA and plavix.  Continue statin, BB- blocker.          Symptomatic  treatment with Pepcid for acid reflux.            2.Hypertension : BP stable.   - Continue BB- blocker.   -  Will discontinue her home dose amlodipine as her BP are stable over BB- blocker.  3.Hyperlipidemia : LDL-173, HDL-37, Tchol- 248 - Statin therapy for aggressive LDL goal (optimally < 70). She complained of some bodyaches on statin due to which she was started  on Vit- D.  4.Hypothyroidism due to primary hypoparathyroidism : TSH levels pending. - Continue thyroid hormone replacement and calcitriol therapy.  5. Hypokalemia: check serum magnesium. Will replete.  6. Dispo: Transfer to telemetry floor.         Moe Brier, M.D. 11/20/2011, 7:24 AM

## 2011-11-20 NOTE — Progress Notes (Signed)
CARDIAC REHAB PHASE I   PRE:  Rate/Rhythm: 81 SR    BP: sitting 116/71    SaO2:   MODE:  Ambulation: 550 ft   POST:  Rate/Rhythm: 73 SR    BP: sitting 125/75     SaO2: 98 RA  Tol well, feels good. Began ed. [id number]  Harriet Masson CES, ACSM

## 2011-11-20 NOTE — Progress Notes (Signed)
Subjective:  Feels good.  Somewhat animated this morning with a smile.  No chest pain.    Objective:  Vital Signs in the last 24 hours: Temp:  [98 F (36.7 C)-98.4 F (36.9 C)] 98.1 F (36.7 C) (11/27 0805) Pulse Rate:  [55-72] 61  (11/27 0805) Resp:  [10-24] 17  (11/27 0805) BP: (94-133)/(46-71) 123/51 mmHg (11/27 0805) SpO2:  [94 %-100 %] 97 % (11/27 0805)  Intake/Output from previous day: 11/26 0701 - 11/27 0700 In: 1020 [P.O.:1020] Out: 3500 [Urine:3500]   Physical Exam: General: Well developed, well nourished, in no acute distress. Head:  Normocephalic and atraumatic. Lungs: Clear to auscultation and percussion. Heart: Normal S1 and S2.  No murmur, rubs or gallops. Groin with bandage.  Looks good.  No hematoma  Pulses: Pulses normal in all 4 extremities. Extremities: No clubbing or cyanosis. No edema. Neurologic: Alert and oriented x 3.    Lab Results:  Basename 11/20/11 0555 11/20/11 0004  WBC 9.5 9.4  HGB 12.2 12.1  PLT 247 259    Basename 11/20/11 0555 11/20/11 0004  NA 141 141  K 3.3* 3.3*  CL 106 106  CO2 25 26  GLUCOSE 99 99  BUN 19 20  CREATININE 1.01 1.05    Basename 11/19/11 1123 11/19/11 0904  TROPONINI 2.65* 1.84*   Hepatic Function Panel No results found for this basename: PROT,ALBUMIN,AST,ALT,ALKPHOS,BILITOT,BILIDIR,IBILI in the last 72 hours  Basename 11/20/11 0004  CHOL 248*   No results found for this basename: PROTIME in the last 72 hours  Imaging: No results found.  EKG:  Evolutionary changes of inferior MI.  Inferior T wave inversion.  Cardiac Studies:  See echo report.  Preserved overall LV function.  Assessment/Plan: Patient Active Hospital Problem List: HYPERLIPIDEMIA-MIXED (03/08/2009)   Assessment: see Dr. [name] note.   Does not tolerate statins well.   Plan: Minimize dosing, but continue Acute MI, subendocardial (11/20/2011)   Assessment: Doing well post MI.  PRU consistent with responsive to clopidogrel.   Plan:  Transfer to floor, cardiac rehab, ambulate         Shawnie Pons, MD, [facility], [facility] 11/20/2011, 8:34 AM

## 2011-11-21 LAB — BASIC METABOLIC PANEL
CO2: 25 mEq/L (ref 19–32)
Chloride: 105 mEq/L (ref 96–112)
Creatinine, Ser: 1.08 mg/dL (ref 0.50–1.10)
GFR calc Af Amer: 62 mL/min — ABNORMAL LOW (ref >90)
Potassium: 4 mEq/L (ref 3.5–5.1)

## 2011-11-21 MED ORDER — POTASSIUM CHLORIDE CRYS ER 20 MEQ PO TBCR
40.0000 meq | EXTENDED_RELEASE_TABLET | Freq: Once | ORAL | Status: AC
  Administered 2011-11-21: 40 meq via ORAL

## 2011-11-21 MED ORDER — LEVOTHYROXINE SODIUM 125 MCG PO TABS
125.0000 ug | ORAL_TABLET | Freq: Every day | ORAL | Status: DC
  Administered 2011-11-22: 125 ug via ORAL
  Filled 2011-11-21 (×2): qty 1

## 2011-11-21 NOTE — Progress Notes (Signed)
Subjective:  Doing well.  No complaints.  Feels great   Objective:  Vital Signs in the last 24 hours: Temp:  [97.3 F (36.3 C)-98.5 F (36.9 C)] 97.4 F (36.3 C) (11/28 0429) Pulse Rate:  [51-76] 51  (11/28 0429) Resp:  [17-20] 20  (11/28 0429) BP: (104-135)/(20-76) 135/76 mmHg (11/28 0429) SpO2:  [94 %-99 %] 95 % (11/28 0429)  Intake/Output from previous day: 11/27 0701 - 11/28 0700 In: 440 [P.O.:440] Out: 350 [Urine:350]   Physical Exam: General: Well developed, well nourished, in no acute distress. Head:  Normocephalic and atraumatic. Lungs: Clear to auscultation and percussion. Heart: Normal S1 and S2.  No murmur, rubs or gallops.  Pulses: Pulses normal in all 4 extremities. Extremities: No clubbing or cyanosis. No edema. Neurologic: Alert and oriented x 3.    Lab Results:  Basename 11/20/11 0555 11/20/11 0004  WBC 9.5 9.4  HGB 12.2 12.1  PLT 247 259    Basename 11/21/11 0550 11/20/11 0555  NA 141 141  K 4.0 3.3*  CL 105 106  CO2 25 25  GLUCOSE 98 99  BUN 18 19  CREATININE 1.08 1.01    Basename 11/19/11 1123 11/19/11 0904  TROPONINI 2.65* 1.84*   Hepatic Function Panel No results found for this basename: PROT,ALBUMIN,AST,ALT,ALKPHOS,BILITOT,BILIDIR,IBILI in the last 72 hours  Basename 11/20/11 0004  CHOL 248*   No results found for this basename: PROTIME in the last 72 hours  Imaging: No results found.  EKG:  Evolutionary changes of IMI    Assessment/Plan:  Patient Active Hospital Problem List: HYPERLIPIDEMIA-MIXED (03/08/2009)   Assessment: On medical therapy   Plan: as above Acute MI, subendocardial (11/20/2011)   Assessment: Progressing nicely   Plan: Ambulate today, discharge tomorrow Hypokalemia:  Discontinue K       Shawnie Pons, MD, [facility], FSCAI 11/21/2011, 9:23 AM

## 2011-11-21 NOTE — Progress Notes (Signed)
CARDIAC REHAB PHASE I   PRE:  Rate/Rhythm: 75    BP: sitting 112/69    SaO2: 97 RA  MODE:  Ambulation: 740 ft   POST:  Rate/Rhythm: 81    BP: sitting 122/75     SaO2:   Tolerated well, feels good.  Ed completed and requests her name be sent to [facility] CRPII. [phone]  Harriet Masson CES, ACSM

## 2011-11-22 ENCOUNTER — Inpatient Hospital Stay (HOSPITAL_COMMUNITY): Payer: 59

## 2011-11-22 ENCOUNTER — Encounter (HOSPITAL_COMMUNITY): Payer: Self-pay | Admitting: Physician Assistant

## 2011-11-22 MED ORDER — METOPROLOL TARTRATE 25 MG PO TABS: 12.5000 mg | ORAL_TABLET | Freq: Two times a day (BID) | ORAL | Status: DC

## 2011-11-22 MED ORDER — CLOPIDOGREL BISULFATE 75 MG PO TABS: 75.0000 mg | ORAL_TABLET | Freq: Every day | ORAL | Status: DC

## 2011-11-22 MED ORDER — NITROGLYCERIN 0.4 MG SL SUBL: 0.4000 mg | SUBLINGUAL_TABLET | SUBLINGUAL | Status: DC | PRN

## 2011-11-22 MED ORDER — ASPIRIN EC 81 MG PO TBEC: 81.0000 mg | DELAYED_RELEASE_TABLET | Freq: Four times a day (QID) | ORAL | Status: DC | PRN

## 2011-11-22 MED ORDER — VITAMIN D 400 UNITS PO TABS: 400.0000 [IU] | ORAL_TABLET | Freq: Two times a day (BID) | ORAL | Status: DC

## 2011-11-22 MED ORDER — ROSUVASTATIN CALCIUM 10 MG PO TABS: 10.0000 mg | ORAL_TABLET | Freq: Every day | ORAL | Status: DC

## 2011-11-22 NOTE — Progress Notes (Signed)
Patient [name], is 65 year female native of [place], [place] now residing in [place], [place].  She is recovering from a cath lab procedure performed early Monday morning.  Today, she will have X-rays and hopes to go home.  Chaplain provided pastoral presence and conversation to patient, her husband, and her daughter.  Will follow-up as needed.

## 2011-11-22 NOTE — Progress Notes (Signed)
Utilization review completed. Naren Benally, RN, BSN. 11/22/11  

## 2011-11-22 NOTE — Discharge Summary (Signed)
Discharge Summary   Patient ID: [name] MRN: [id number], DOB/AGE: [birth year] 64 y.o. Admit date: 11/19/2011 D/C date:     11/22/2011   Primary Discharge Diagnoses:  1. Acute NSTEMI/CAD - s/p overlapping DES to RCA with residual nonobstructive disease in the LAD/LCx/RCA 11/19/11 - normal LV function by echo 11/19/11 2. HTN 3. HL  Secondary Discharge Diagnoses:  1. GERD 2. Vertigo 3. Tubular adenoma of colon 4. History of colitis 5. Multinodular goiter s/p thyroidectomy - postop hypocalcemia - postop hypothyroidism, on levothyroxine - TSH mildly low at 0.265 - instructed to f/u with PCP 6. Shoulder arthroscopy with right rotator cuff repair  Hospital Course: 65 y/o F with history of nonobstructive CAD presented to [facility] complaining of chest tightness not associated with any other symptoms, occuring at rest. Upon presentation to the [facility] ED, an ECG was performed, demonstrating ST segment abnormalities prompting the ED physicians to activate a code STEMI. After review of her ECG by cardiology team, it was determined that she did not meet technical criteria for a STEMI. However, on the basis of her ongoing chest pain despite IV Heparin and SL NTG, together with a POC Troponin of 0.58, she was brought to the cath lab for coronary angiography and possible intervention. Cath demonstrated subtotal'd RCA which was treated with overlapping DES. It was felt she had had an NSTEMI with a peak troponin at 2.65. Medical therapy with ASA/Plavix/statin/BB was initiated. In the past, however, statin has not been tolerated well so only low dose was undertaken. 2D echo showed normal EF 60-65%. PRU showed responsiveness to Plavix. This morning she did have some lateral leg pain which is chronic, and Dr. Riley Kill felt there were no symptoms to suggest iliofemoral hemorrhage and thus recommended her to f/u with Dr. Debby Bud.Today the patient is feeling well. Dr. Riley Kill has seen and examined the patient  and feels she is stable for discharge.  Discharge Vitals: Blood pressure 120/63, pulse 53, temperature 97.7 F (36.5 C), temperature source Oral, resp. rate 18, height 5\' 5"  (1.651 m), weight 196 lb (88.905 kg), SpO2 97.00%.  Labs: Lab Results  Component Value Date   WBC 9.5 11/20/2011   HGB 12.2 11/20/2011   HCT 36.8 11/20/2011   MCV 84.8 11/20/2011   PLT 247 11/20/2011    Lab 11/21/11 0550  NA 141  K 4.0  CL 105  CO2 25  BUN 18  CREATININE 1.08  CALCIUM 8.9  PROT --  BILITOT --  ALKPHOS --  ALT --  AST --  GLUCOSE 98   Lab Results  Component Value Date   CHOL 248* 11/20/2011   HDL 37* 11/20/2011   LDLCALC 173* 11/20/2011   TRIG 189* 11/20/2011     Diagnostic Studies/Procedures:  1. 11/22/2011  IMPRESSION: No active cardiopulmonary disease.   2. Cardiac catheterization this admission, please see full report and above for summary. 3. 11/19/11 2D echo - Left ventricle: DIfficult acoustic windows limit study. Overall LVEF is normal at 60 to 65%. There are no definite wall motion abnormalities. The cavity size was normal. Wall thickness was normal. Doppler parameters are consistent with abnormal left ventricular relaxation (grade 1 diastolic dysfunction).   Discharge Medications   Current Discharge Medication List    START taking these medications   Details  clopidogrel (PLAVIX) 75 MG tablet Take 1 tablet (75 mg total) by mouth daily with breakfast. Qty: 30 tablet, Refills: 6    metoprolol tartrate (LOPRESSOR) 25 MG tablet Take 0.5 tablets (12.5  mg total) by mouth 2 (two) times daily. Qty: 60 tablet, Refills: 6    nitroGLYCERIN (NITROSTAT) 0.4 MG SL tablet Place 1 tablet (0.4 mg total) under the tongue every 5 (five) minutes as needed for chest pain (Up to 3 doses.). Qty: 25 tablet, Refills: 4    rosuvastatin (CRESTOR) 10 MG tablet Take 1 tablet (10 mg total) by mouth daily. Qty: 30 tablet, Refills: 6    vitamin D, CHOLECALCIFEROL, 400 UNITS tablet Take 1  tablet (400 Units total) by mouth 2 (two) times daily. Qty: 60 tablet, Refills: 1      CONTINUE these medications which have CHANGED   Details  aspirin EC 81 MG tablet Take 1 tablet (81 mg total) by mouth every 6 (six) hours as needed. For pain.      CONTINUE these medications which have NOT CHANGED   Details  calcitRIOL (ROCALTROL) 0.25 MCG capsule Take 0.75 mcg by mouth 2 (two) times daily.      calcium carbonate (TUMS - DOSED IN MG ELEMENTAL CALCIUM) 500 MG chewable tablet Chew 8 tablets by mouth daily.      levothyroxine (SYNTHROID, LEVOTHROID) 125 MCG tablet Take 125 mcg by mouth daily.        STOP taking these medications     amLODipine (NORVASC) 10 MG tablet      atorvastatin (LIPITOR) 40 MG tablet         Disposition   The patient will be discharged in stable condition to home. Discharge Orders    Future Appointments: Provider: Department: Dept Phone: Center:   12/07/2011 10:00 AM Beatrice Lecher, PA [facility] [phone] [id number]     Future Orders Please Complete By Expires   Diet - low sodium heart healthy      Increase activity slowly      Comments:   No driving for 2 days. No lifting over 5 lbs for 1 week. No sexual activity for 1 week.   Discharge wound care:      Comments:   Keep procedure site clean & dry. If you notice increased pain, swelling, bleeding or pus, call/return!  You may shower, but no soaking baths/hot tubs/pools for 1 week.       Follow-up Information    Follow up with Illene Regulus, MD. (Your TSH was mildly low and needs to be rechecked. Your thyroid medicine may need adjusting, so please follow up with Dr. Debby Bud within the next week. Dr. Riley Kill would also like you to see him for your leg pain.)    Contact information:   [address] [facility] [address] [place] [address] [place] [address] [phone]       Follow up with Tereso Newcomer, PA. (12/07/11 at 10am at Dr. Charlott Rakes office)    Contact information:   [address] [place] [address] [phone]            Duration of Discharge Encounter: Greater than 30 minutes including physician and PA time.  Signed, Dayna Dunn PA-C 11/22/2011, 2:42 PM

## 2011-11-22 NOTE — Progress Notes (Signed)
Subjective:  Doing well except some pain down lateral leg.  No groin pain, or pain in groin with walking to suggest iliofemoral hemorrhage.  Pain is chronic, and she has seen Dr. Ranell Patrick.  She was referred to pain clinic for this.  It goes down lateral leg, and she has some tenderness in lumbar region.  She says this has been of more than two months duration.   Objective:  Vital Signs in the last 24 hours: Temp:  [97 F (36.1 C)-98.3 F (36.8 C)] 97.3 F (36.3 C) (11/29 0600) Pulse Rate:  [57-69] 57  (11/29 0600) Resp:  [18-20] 20  (11/29 0600) BP: (112-139)/(68-75) 112/68 mmHg (11/29 0600) SpO2:  [94 %-95 %] 95 % (11/29 0600)  Intake/Output from previous day: 11/28 0701 - 11/29 0700 In: 723 [P.O.:720; I.V.:3] Out: -    Physical Exam: General: Well developed, well nourished, in no acute distress. Head:  Normocephalic and atraumatic. Lungs: Clear to auscultation and percussion. Back:  Slight tenderness in low lumbar area.  Lateral leg distribution of discomfort. Heart: Normal S1 and S2.  No murmur, rubs or gallops.  Pulses: Pulses normal in all 4 extremities. Extremities: No clubbing or cyanosis. No edema. Neurologic: Alert and oriented x 3.  Normal ROM of R leg.  No pain with foot flexion.  Normal motor function.     Lab Results:  Basename 11/20/11 0555 11/20/11 0004  WBC 9.5 9.4  HGB 12.2 12.1  PLT 247 259    Basename 11/21/11 0550 11/20/11 0555  NA 141 141  K 4.0 3.3*  CL 105 106  CO2 25 25  GLUCOSE 98 99  BUN 18 19  CREATININE 1.08 1.01    Basename 11/19/11 1123 11/19/11 0904  TROPONINI 2.65* 1.84*   Hepatic Function Panel No results found for this basename: PROT,ALBUMIN,AST,ALT,ALKPHOS,BILITOT,BILIDIR,IBILI in the last 72 hours  Basename 11/20/11 0004  CHOL 248*   No results found for this basename: PROTIME in the last 72 hours  Imaging: No results found.  No PA and lateral CXR done yet.        Assessment/Plan:  Patient Active Hospital Problem  List: HYPERLIPIDEMIA-MIXED (03/08/2009)   Assessment: Has statin issues   Plan: Low dose Crestor.  Follow up with Dr. [name] Acute MI, subendocardial (11/20/2011)   Assessment: No chest pain.  Doing well   Plan: Home today.  Follow up 2 weeks.  Continue DAPT, low dose beta blockers, and low dose statin. Lateral leg discomfort   Suggest follow up with Dr. Ranell Patrick.  PA and Lat CXR before discharge.          Shawnie Pons, MD, [facility], FSCAI 11/22/2011, 8:04 AM

## 2011-11-23 ENCOUNTER — Telehealth: Payer: Self-pay | Admitting: Internal Medicine

## 2011-11-23 MED ORDER — CLOPIDOGREL BISULFATE 75 MG PO TABS: 75.0000 mg | ORAL_TABLET | Freq: Every day | ORAL | Status: DC

## 2011-11-23 MED ORDER — METOPROLOL TARTRATE 25 MG PO TABS: 25.0000 mg | ORAL_TABLET | Freq: Two times a day (BID) | ORAL | Status: DC

## 2011-11-23 NOTE — Telephone Encounter (Signed)
Called patient back. She went to pharmacy to pick up Crestor and insurance company required preauthorization. I left samples for her to pick up of Crestor20mg  and instructed her to cut pill in half. Called [facility] and they will fax prior authorization form. She states that she needs Plavix and Metoprolol [facility]. Will send these 2 to [facility].

## 2011-11-23 NOTE — Telephone Encounter (Signed)
New message:  Pt needs to speak to you regarding some medication.

## 2011-11-23 NOTE — Telephone Encounter (Signed)
LMOM for call back. 

## 2011-11-23 NOTE — Telephone Encounter (Signed)
F/u from previous call:  Daughter returning nurse call

## 2011-11-26 ENCOUNTER — Telehealth: Payer: Self-pay | Admitting: Internal Medicine

## 2011-11-26 NOTE — Telephone Encounter (Signed)
Called patient's daughter. She would like [place] to complete the FMLA form for her Mom and call when ready to pick up.

## 2011-11-26 NOTE — Telephone Encounter (Signed)
New problem:  Patient daughter discuss mom condition.

## 2011-11-28 ENCOUNTER — Telehealth: Payer: Self-pay | Admitting: Internal Medicine

## 2011-11-28 NOTE — Telephone Encounter (Signed)
Spoke with daughter of pt who is very concerned about pt's BP being 160/80.  Pt has taken her medications as ordered.  She states she had a very bad headache and took her BP and it was elevated.  She doesn't know if the h/a caused she BP to increase or if the increased BP caused the h/a.  She will monitor her blood pressure and report back if the BP is continuing to be high.  She knows to call immediately  if BP is 200/100 or higher.  Daughter also reports the pt has complained of right sided chest pain that is not the same as before.   It was "not very strong" and didn't last that long.  It resolved with rest and has not reoccurred.  Daughter is instructed to have pt use SL ntg if chest pain returns and go to ED if chest pain is not relieved.

## 2011-11-28 NOTE — Telephone Encounter (Signed)
Pt had a stent placed and b/p was 160/80 last night and some chest pain and she is very tired and has headaches all this was last night and they want to make sure is this normal

## 2011-11-29 NOTE — Telephone Encounter (Signed)
Called patient and advised that FMLA form is complete and can be picked up at the front desk.

## 2011-12-07 ENCOUNTER — Encounter: Payer: Self-pay | Admitting: Physician Assistant

## 2011-12-07 ENCOUNTER — Ambulatory Visit (INDEPENDENT_AMBULATORY_CARE_PROVIDER_SITE_OTHER): Payer: 59 | Admitting: Physician Assistant

## 2011-12-07 DIAGNOSIS — I2510 Atherosclerotic heart disease of native coronary artery without angina pectoris: Secondary | ICD-10-CM

## 2011-12-07 DIAGNOSIS — E039 Hypothyroidism, unspecified: Secondary | ICD-10-CM

## 2011-12-07 DIAGNOSIS — E785 Hyperlipidemia, unspecified: Secondary | ICD-10-CM

## 2011-12-07 DIAGNOSIS — I10 Essential (primary) hypertension: Secondary | ICD-10-CM

## 2011-12-07 DIAGNOSIS — R001 Bradycardia, unspecified: Secondary | ICD-10-CM | POA: Insufficient documentation

## 2011-12-07 DIAGNOSIS — I498 Other specified cardiac arrhythmias: Secondary | ICD-10-CM

## 2011-12-07 MED ORDER — CALCITRIOL 0.25 MCG PO CAPS: 0.5000 ug | ORAL_CAPSULE | Freq: Two times a day (BID) | ORAL | Status: DC

## 2011-12-07 MED ORDER — METOPROLOL TARTRATE 25 MG PO TABS: 12.5000 mg | ORAL_TABLET | Freq: Two times a day (BID) | ORAL | Status: DC

## 2011-12-07 MED ORDER — FAMOTIDINE 20 MG PO TABS
20.0000 mg | ORAL_TABLET | Freq: Two times a day (BID) | ORAL | Status: DC
Start: 2011-12-07 — End: 2011-12-08

## 2011-12-07 MED ORDER — AMLODIPINE BESYLATE 5 MG PO TABS: 5.0000 mg | ORAL_TABLET | Freq: Every day | ORAL | Status: DC

## 2011-12-07 NOTE — Assessment & Plan Note (Signed)
Tolerating Crestor.  Check lipids and LFTs in 6 weeks.

## 2011-12-07 NOTE — Assessment & Plan Note (Signed)
She would like her calcitriol refilled.  We will do that for her today.

## 2011-12-07 NOTE — Assessment & Plan Note (Signed)
Managed by Dr. Lisabeth Devoid.

## 2011-12-07 NOTE — Assessment & Plan Note (Signed)
I believe that she is symptomatic.  Decrease metoprolol to 25 mg one half tablet twice daily.  If she does not improve with this, we can discontinue.  I explained to her the importance of taking a beta blocker post MI.

## 2011-12-07 NOTE — Assessment & Plan Note (Addendum)
Doing well post MI.  She does have some atypical chest pains.  She denies any further angina. Question if Plavix is causing GI upset.  I will place her on Pepcid 20 mg twice a day.  Continue aspirin and Plavix.  Proceed with cardiac rehabilitation.  Followup with Dr. [name] in 6 weeks.  I spent 30 minutes answering all of her questions today.

## 2011-12-07 NOTE — Assessment & Plan Note (Signed)
Blood pressure is elevated at home.  Restart Norvasc 5 mg daily.

## 2011-12-07 NOTE — Patient Instructions (Signed)
Your physician recommends that you schedule a follow-up appointment in: 6 weeks with Dr [name] Your physician recommends that you return for lab work in: 6 weeks Your physician has recommended you make the following change in your medication: DECREASE Metoprolol 25 mg 1/2 tab twice daily START Norvasc 5 mg daily (may cut 10 mg tab in half to equal 5 mg) START Pepcid 20 mg twice daily

## 2011-12-07 NOTE — Progress Notes (Signed)
[address] [place], [place]  [address] Phone: [phone] Fax:  [phone]  Date:  12/07/2011   Name:  [name]       DOB:  [birth year] MRN:  [id number]  PCP:  Dr. [name] Peer Primary Cardiologist:  Dr. Dietrich Pates  Primary Electrophysiologist:  None    History of Present Illness: [name] is a 65 y.o. female who presents for post hospital follow up.  She was admitted 11/26-11/29 with an NSTEMI.  She had significant ECG changes upon admission that were concerning for STEMI.  The STEMI was called off after further review by cardiology.  However, the patient had ongoing symptoms and was taken to the to the cardiac catheterization lab.  LHC 11/19/11: pLAD 30%, oOM 60%, AVCFX 30%, CFX after OM2 30%, pRCA 60 and 70%, then 99%, AM 80-90% with TIMI 3 flow.  PCI: Promus DES x 2 to RCA.  Labs: Hemoglobin 12.2, potassium 4, creatinine 1.08, magnesium 1.9, TSH 0.265, TC 248, HDL 37, LDL 173, TG 189, PRU 42.  Chest x-ray unremarkable.  Echocardiogram 11/19/11: Difficult acoustic windows, EF 60-65%, normal LV wall thickness, grade 1 diastolic dysfunction.  Of note, she does have primary hypoparathyroidism with hypocalcemia.  She also has hypothyroidism and is status post thyroidectomy due to multinodular goiter.  As noted, her TSH was low and she was asked to follow up with her PCP for further evaluation and management.  She denies any further angina.  She will occasionally have pain in her left chest she describes this as a "pinch".  She denies dyspnea.  She denies orthopnea, PND or edema.  She does have episodes where she feels she loses energy for about one to 2 minutes.  She denies syncope or near-syncope.  She feels lightheaded.  She does note diaphoresis associated with this.  She is interested in cardiac rehabilitation.  She has followed up with her PCP since discharge to check her thyroid.  She notes that her blood pressures are quite elevated at home.  She was  taking half a tablet of metoprolol twice a day in the hospital, but she is now taking a whole tablet twice a day.  She was previously on Norvasc 10 mg daily.  Past Medical History  Diagnosis Date  . GERD (gastroesophageal reflux disease)     occasional  . Hyperlipidemia   . Hypertension     Echocardiogram 11/19/11: Difficult acoustic windows, EF 60-65%, normal LV wall thickness, grade 1 diastolic dysfunction  . Vertigo   . Tubular adenoma of colon 06/2011  . Colitis   . Multinodular thyroid     Goiter s/p thyroidectomy in 2007 with post-op  hypocalcemia and post-op hypothyroidism (?hypoparathyroidism)  . CAD (coronary artery disease)     NSTEMI 12/12: LHC 11/19/11: pLAD 30%, oOM 60%, AVCFX 30%, CFX after OM2 30%, pRCA 60 and 70%, then 99%, AM 80-90% with TIMI 3 flow.  PCI: Promus DES x 2 to RCA.  Marland Kitchen Post-surgical hypothyroidism   . Hypoparathyroidism     Current Outpatient Prescriptions  Medication Sig Dispense Refill  . aspirin EC 81 MG tablet Take 1 tablet (81 mg total) by mouth every 6 (six) hours as needed. For pain.      . calcitRIOL (ROCALTROL) 0.25 MCG capsule Take 0.75 mcg by mouth 2 (two) times daily.        . calcium carbonate (TUMS - DOSED IN MG ELEMENTAL CALCIUM) 500 MG chewable tablet Chew 8 tablets by mouth daily.        Marland Kitchen  clopidogrel (PLAVIX) 75 MG tablet Take 1 tablet (75 mg total) by mouth daily with breakfast.  90 tablet  3  . levothyroxine (SYNTHROID, LEVOTHROID) 125 MCG tablet Take 125 mcg by mouth daily.        . metoprolol tartrate (LOPRESSOR) 25 MG tablet Take 1 tablet (25 mg total) by mouth 2 (two) times daily.  180 tablet  3  . rosuvastatin (CRESTOR) 10 MG tablet Take 1 tablet (10 mg total) by mouth daily.  30 tablet  6  . vitamin D, CHOLECALCIFEROL, 400 UNITS tablet Take 1 tablet (400 Units total) by mouth 2 (two) times daily.  60 tablet  1  . nitroGLYCERIN (NITROSTAT) 0.4 MG SL tablet Place 1 tablet (0.4 mg total) under the tongue every 5 (five) minutes as  needed for chest pain (Up to 3 doses.).  25 tablet  4    Allergies: No Known Allergies  History  Substance Use Topics  . Smoking status: Never Smoker   . Smokeless tobacco: Never Used  . Alcohol Use: No     ROS:  Please see the history of present illness.   She notes constipation.  All other systems reviewed and negative.   PHYSICAL EXAM: VS:  BP 134/68  Pulse 51  Ht 5\' 5"  (1.651 m)  Wt 198 lb (89.812 kg)  BMI 32.95 kg/m2 Well nourished, well developed, in no acute distress HEENT: normal Neck: no JVD Vascular: No carotid bruits Cardiac:  normal S1, S2; RRR; no murmur Lungs:  clear to auscultation bilaterally, no wheezing, rhonchi or rales Abd: soft, nontender, no hepatomegaly Ext: no edema; Right femoral arteriotomy site without hematoma or bruit Skin: warm and dry Neuro:  CNs 2-12 intact, no focal abnormalities noted  EKG:   Sinus rhythm, heart rate 51, normal axis, inferior Q waves, nonspecific ST-T wave changes, J-point elevation, no significant change from prior  ASSESSMENT AND PLAN:

## 2011-12-08 ENCOUNTER — Emergency Department (HOSPITAL_COMMUNITY)
Admission: EM | Admit: 2011-12-08 | Discharge: 2011-12-08 | Disposition: A | Discharge status: Home / Self Care | Payer: 59 | Attending: Emergency Medicine | Admitting: Emergency Medicine

## 2011-12-08 ENCOUNTER — Other Ambulatory Visit: Payer: Self-pay

## 2011-12-08 ENCOUNTER — Emergency Department (HOSPITAL_COMMUNITY): Payer: 59

## 2011-12-08 ENCOUNTER — Encounter (HOSPITAL_COMMUNITY): Payer: Self-pay | Admitting: *Deleted

## 2011-12-08 DIAGNOSIS — E785 Hyperlipidemia, unspecified: Secondary | ICD-10-CM | POA: Insufficient documentation

## 2011-12-08 DIAGNOSIS — E042 Nontoxic multinodular goiter: Secondary | ICD-10-CM | POA: Insufficient documentation

## 2011-12-08 DIAGNOSIS — I10 Essential (primary) hypertension: Secondary | ICD-10-CM | POA: Insufficient documentation

## 2011-12-08 DIAGNOSIS — K219 Gastro-esophageal reflux disease without esophagitis: Secondary | ICD-10-CM | POA: Insufficient documentation

## 2011-12-08 DIAGNOSIS — R072 Precordial pain: Secondary | ICD-10-CM | POA: Insufficient documentation

## 2011-12-08 DIAGNOSIS — I2510 Atherosclerotic heart disease of native coronary artery without angina pectoris: Secondary | ICD-10-CM | POA: Insufficient documentation

## 2011-12-08 DIAGNOSIS — E890 Postprocedural hypothyroidism: Secondary | ICD-10-CM | POA: Insufficient documentation

## 2011-12-08 LAB — POCT I-STAT, CHEM 8
Calcium, Ion: 1.32 mmol/L (ref 1.12–1.32)
Glucose, Bld: 89 mg/dL (ref 70–99)
HCT: 33 % — ABNORMAL LOW (ref 36.0–46.0)
Hemoglobin: 11.2 g/dL — ABNORMAL LOW (ref 12.0–15.0)
TCO2: 32 mmol/L (ref 0–100)

## 2011-12-08 LAB — CARDIAC PANEL(CRET KIN+CKTOT+MB+TROPI)
CK, MB: 1.3 ng/mL (ref 0.3–4.0)
Relative Index: INVALID (ref 0.0–2.5)
Troponin I: <0.3 ng/mL (ref <0.30)

## 2011-12-08 NOTE — ED Notes (Signed)
Pt results are back MD made aware, family reassured MD will be in to talk with them, pt assessed and in no apparent distress at this time

## 2011-12-08 NOTE — ED Provider Notes (Signed)
History     CSN: [id number] Arrival date & time: 12/08/2011  4:33 PM   First MD Initiated Contact with Patient 12/08/11 1646      Chief Complaint  Patient presents with  . Chest Pain    (Consider location/radiation/quality/duration/timing/severity/associated sxs/prior treatment) HPI...Marland Kitchen precordial chest pain approximately 1400 today. Symptoms come and go. Described as sharp. No nausea, diaphoresis, dyspnea.  One nitroglycerin tablet helped.  Status post MI approximately 3 weeks ago with 2 stents. Patient has been doing well since. Daughter says she looks normal.  Past Medical History  Diagnosis Date  . GERD (gastroesophageal reflux disease)     occasional  . Hyperlipidemia   . Hypertension     Echocardiogram 11/19/11: Difficult acoustic windows, EF 60-65%, normal LV wall thickness, grade 1 diastolic dysfunction  . Vertigo   . Tubular adenoma of colon 06/2011  . Colitis   . Multinodular thyroid     Goiter s/p thyroidectomy in 2007 with post-op  hypocalcemia and post-op hypothyroidism (?hypoparathyroidism)  . CAD (coronary artery disease)     NSTEMI 12/12: LHC 11/19/11: pLAD 30%, oOM 60%, AVCFX 30%, CFX after OM2 30%, pRCA 60 and 70%, then 99%, AM 80-90% with TIMI 3 flow.  PCI: Promus DES x 2 to RCA.  Marland Kitchen Post-surgical hypothyroidism   . Hypoparathyroidism     Past Surgical History  Procedure Date  . Colonoscopy Aenomatous polyps    07/05/2011  . Total thyroidectomy 2007    GOITER  . Shoulder arthroscopy w/ rotator cuff repair     right    Family History  Problem Relation Age of Onset  . Colon cancer Brother     History  Substance Use Topics  . Smoking status: Never Smoker   . Smokeless tobacco: Never Used  . Alcohol Use: No    OB History    Grav Para Term Preterm Abortions TAB SAB Ect Mult Living                  Review of Systems  All other systems reviewed and are negative.    Allergies  Review of patient's allergies indicates no known  allergies.  Home Medications   Current Outpatient Rx  Name Route Sig Dispense Refill  . AMLODIPINE BESYLATE 5 MG PO TABS Oral Take 1 tablet (5 mg total) by mouth daily. 90 tablet 3  . ASPIRIN EC 81 MG PO TBEC Oral Take 81 mg by mouth daily.      Marland Kitchen CALCITRIOL 0.25 MCG PO CAPS Oral Take 2 capsules (0.5 mcg total) by mouth 2 (two) times daily. 120 capsule 3  . CALCIUM CARBONATE ANTACID 500 MG PO CHEW Oral Chew 4 tablets by mouth 2 (two) times daily.     Marland Kitchen CLOPIDOGREL BISULFATE 75 MG PO TABS Oral Take 1 tablet (75 mg total) by mouth daily with breakfast. 90 tablet 3  . LEVOTHYROXINE SODIUM 125 MCG PO TABS Oral Take 125 mcg by mouth daily.      Marland Kitchen METOPROLOL TARTRATE 25 MG PO TABS Oral Take 0.5 tablets (12.5 mg total) by mouth 2 (two) times daily. 90 tablet 3  . NITROGLYCERIN 0.4 MG SL SUBL Sublingual Place 1 tablet (0.4 mg total) under the tongue every 5 (five) minutes as needed for chest pain (Up to 3 doses.). 25 tablet 4  . ROSUVASTATIN CALCIUM 10 MG PO TABS Oral Take 1 tablet (10 mg total) by mouth daily. 30 tablet 6  . VITAMIN D 400 UNITS PO TABS Oral Take 1 tablet (400  Units total) by mouth 2 (two) times daily. 60 tablet 1    BP 149/61  Pulse 51  Temp(Src) 98.2 F (36.8 C) (Oral)  Resp 20  SpO2 99%  Physical Exam  Nursing note and vitals reviewed. Constitutional: She is oriented to person, place, and time. She appears well-developed and well-nourished.  HENT:  Head: Normocephalic and atraumatic.  Eyes: Conjunctivae and EOM are normal. Pupils are equal, round, and reactive to light.  Neck: Normal range of motion. Neck supple.  Cardiovascular: Normal rate and regular rhythm.   Pulmonary/Chest: Effort normal and breath sounds normal.  Abdominal: Soft. Bowel sounds are normal.  Musculoskeletal: Normal range of motion.  Neurological: She is alert and oriented to person, place, and time.  Skin: Skin is warm and dry.  Psychiatric: She has a normal mood and affect.    ED Course   Procedures (including critical care time)  Labs Reviewed  POCT I-STAT, CHEM 8 - Abnormal; Notable for the following:    BUN 27 (*)    Creatinine, Ser 1.50 (*)    Hemoglobin 11.2 (*)    HCT 33.0 (*)    All other components within normal limits  I-STAT, CHEM 8  CARDIAC PANEL(CRET KIN+CKTOT+MB+TROPI)   No results found. Results for orders placed during the hospital encounter of 12/08/11  CARDIAC PANEL(CRET KIN+CKTOT+MB+TROPI)      Component Value Range   Total CK 40  7 - 177 (U/L)   CK, MB 1.3  0.3 - 4.0 (ng/mL)   Troponin I <0.30  <0.30 (ng/mL)   Relative Index RELATIVE INDEX IS INVALID  0.0 - 2.5   POCT I-STAT, CHEM 8      Component Value Range   Sodium 142  135 - 145 (mEq/L)   Potassium 3.8  3.5 - 5.1 (mEq/L)   Chloride 104  96 - 112 (mEq/L)   BUN 27 (*) 6 - 23 (mg/dL)   Creatinine, Ser 1.61 (*) 0.50 - 1.10 (mg/dL)   Glucose, Bld 89  70 - 99 (mg/dL)   Calcium, Ion 0.96  0.45 - 1.32 (mmol/L)   TCO2 32  0 - 100 (mmol/L)   Hemoglobin 11.2 (*) 12.0 - 15.0 (g/dL)   HCT 40.9 (*) 81.1 - 46.0 (%)    No diagnosis found.  Date: 12/08/2011  Rate: 60  Rhythm: normal sinus rhythm  QRS Axis: normal  Intervals: normal  ST/T Wave abnormalities: normal  Conduction Disutrbances:none  Narrative Interpretation:   Old EKG Reviewed: none available   MDM   Patient looks well in ED.  Cardiac enzymes negative. Discussed test results with family. Patient is pain-free at discharge       Donnetta Hutching, MD 12/08/11 2006

## 2011-12-08 NOTE — ED Notes (Signed)
Patient was lying down on the bed when she started having left sided chest pain.  Patient took 1 nitro and her pain was resolved.  Patient denies any other associated symptoms.  Patient was here three weeks ago and Code Stemi

## 2011-12-08 NOTE — ED Notes (Signed)
portible xray at bedside 

## 2011-12-10 ENCOUNTER — Telehealth: Payer: Self-pay | Admitting: Internal Medicine

## 2011-12-10 MED ORDER — AMLODIPINE BESYLATE 10 MG PO TABS: 10.0000 mg | ORAL_TABLET | Freq: Every day | ORAL | Status: DC

## 2011-12-10 NOTE — Telephone Encounter (Signed)
Pt calling re BP high , med not working

## 2011-12-10 NOTE — Telephone Encounter (Signed)
Make sure she has f/u

## 2011-12-10 NOTE — Telephone Encounter (Signed)
Pt had app here Friday 12/07/11 and BP med's were adjusted, went to er Saturday 12/08/11 for chest pain, BP readings since er were 160/85, 143/80, 160/80 per pt. C/o headache also with elevated BP. Spoke with Wende Mott PA, he reviewed notes and wants pt to increase Norvasc to 10 mg daily, take BP/pulse daily and call results to Dr Ross/ nurse in one week or sooner if needed. Pt aware and will call back with further questions or concerns.

## 2011-12-11 ENCOUNTER — Telehealth: Payer: Self-pay | Admitting: Internal Medicine

## 2011-12-11 NOTE — Telephone Encounter (Signed)
Has follow up appointment on 01/21/2012.

## 2011-12-11 NOTE — Telephone Encounter (Signed)
Called patient and spoke with her husband. He states that she will call me back.

## 2011-12-11 NOTE — Telephone Encounter (Signed)
New message:  Pt has a question about her rehab being started in January.  She wants to go this year because her deductible has been met. Please call and discuss.

## 2011-12-13 MED ORDER — METOPROLOL TARTRATE 25 MG PO TABS: ORAL_TABLET | ORAL | Status: DC

## 2011-12-13 NOTE — Telephone Encounter (Signed)
Called patient and advised her that she needs to increase metoprolol to 25 mg to 2 times per day per Dr.Ross. She will kept track of BP and HR and let us know if she is not feeling better. Patient wanted to start Cardiac Rehab this month but I advised her that the unit is closed for renovation and will not open again until January 2013. Cardiac Rehab will call her to schedule orientation session.

## 2011-12-13 NOTE — Telephone Encounter (Signed)
Fu call °Pr returning your call °

## 2011-12-13 NOTE — Telephone Encounter (Signed)
I would go up on metoprolol to 25 bid.  Follow BP and HR.

## 2011-12-13 NOTE — Telephone Encounter (Signed)
Called patient back. She is complaining of having a headache on the left side of her head since she left the hospital and " pounding heart beats" that she hears in her ears all the time. BP still running  at 160 to 165 over 70 to 80. Heart rate stays at 85. Metoprolol was decreased to 12.5mg  2 times per day on 12/14 by Tereso Newcomer PA Norvasc was increased to 10mg  per day by Tereso Newcomer on 12/17. Advised will discuss with Dr.Ross and call her back.

## 2011-12-19 ENCOUNTER — Telehealth: Payer: Self-pay

## 2011-12-19 ENCOUNTER — Telehealth: Payer: Self-pay | Admitting: Internal Medicine

## 2011-12-19 NOTE — Telephone Encounter (Signed)
Patient walked in office this afternoon wanting Dr.Ross to know since her heart cath last week she is having pain left temple and pain above left eye.Also complains after takes her morning meds of metoprolol,norvasc,plavix asa she itches around her neck.Forwarded to Dr. [name] for advice.

## 2011-12-19 NOTE — Telephone Encounter (Signed)
Patient presented requesting copy of last office visit mailed to [facility] at [phone]. New letter indicated the need for an updated OV, sent 12/14 visit. Will send updated OV from 01/21/12 when completed.

## 2011-12-20 NOTE — Telephone Encounter (Signed)
Discussed with Dr.Ross. Will see in clinic on 12/28 at 845 am Patient aware of appointment tomorrow.

## 2011-12-21 ENCOUNTER — Ambulatory Visit (INDEPENDENT_AMBULATORY_CARE_PROVIDER_SITE_OTHER)
Admission: RE | Admit: 2011-12-21 | Discharge: 2011-12-21 | Disposition: A | Discharge status: Home / Self Care | Payer: 59 | Source: Ambulatory Visit | Attending: Internal Medicine | Admitting: Internal Medicine

## 2011-12-21 ENCOUNTER — Encounter: Payer: Self-pay | Admitting: Internal Medicine

## 2011-12-21 ENCOUNTER — Ambulatory Visit (INDEPENDENT_AMBULATORY_CARE_PROVIDER_SITE_OTHER): Payer: 59 | Admitting: Internal Medicine

## 2011-12-21 VITALS — BP 122/60 | HR 60 | Ht 65.0 in | Wt 198.0 lb

## 2011-12-21 DIAGNOSIS — I10 Essential (primary) hypertension: Secondary | ICD-10-CM

## 2011-12-21 DIAGNOSIS — E785 Hyperlipidemia, unspecified: Secondary | ICD-10-CM

## 2011-12-21 DIAGNOSIS — I2510 Atherosclerotic heart disease of native coronary artery without angina pectoris: Secondary | ICD-10-CM

## 2011-12-21 DIAGNOSIS — R51 Headache: Secondary | ICD-10-CM

## 2011-12-21 LAB — BASIC METABOLIC PANEL
CO2: 27 mEq/L (ref 19–32)
Calcium: 9.2 mg/dL (ref 8.4–10.5)
Chloride: 107 mEq/L (ref 96–112)
Potassium: 4 mEq/L (ref 3.5–5.1)
Sodium: 142 mEq/L (ref 135–145)

## 2011-12-21 LAB — CBC WITH DIFFERENTIAL/PLATELET
Basophils Absolute: 0.1 10*3/uL (ref 0.0–0.1)
Lymphocytes Relative: 24.1 % (ref 12.0–46.0)
Lymphs Abs: 2 10*3/uL (ref 0.7–4.0)
Monocytes Relative: 6.4 % (ref 3.0–12.0)
Neutrophils Relative %: 65.3 % (ref 43.0–77.0)
Platelets: 220 10*3/uL (ref 150.0–400.0)
RDW: 15 % — ABNORMAL HIGH (ref 11.5–14.6)
WBC: 8.1 10*3/uL (ref 4.5–10.5)

## 2011-12-21 MED ORDER — ROSUVASTATIN CALCIUM 10 MG PO TABS: 10.0000 mg | ORAL_TABLET | Freq: Every day | ORAL | Status: DC

## 2011-12-21 NOTE — Patient Instructions (Addendum)
Non-Cardiac CT scanning, (CAT scanning), is a noninvasive, special x-ray that produces cross-sectional images of the body using x-rays and a computer. CT scans help physicians diagnose and treat medical conditions. For some CT exams, a contrast material is used to enhance visibility in the area of the body being studied. CT scans provide greater clarity and reveal more details than regular x-ray exams.  CT of head without contrast today.   Your physician recommends that you have lab work today: BMP and CBC  Your physician has recommended you make the following change in your medication: Please taper the dose of Metoprolol Tartrate take one-half tablet on Friday evening, Saturday morning and evening and then your last dose on Sunday morning---then discontinue medication .  RESTART Pepcid 20mg  take one by mouth daily

## 2011-12-21 NOTE — Progress Notes (Signed)
HPI [name] is a 65 y.o. female with history of CAD.   She was admitted 11/26-11/29 with an NSTEMI.  LHC 11/19/11: pLAD 30%, oOM 60%, AVCFX 30%, CFX after OM2 30%, pRCA 60 and 70%, then 99%, AM 80-90% with TIMI 3 flow. PCI: Promus DES x 2 to RCA.  Echocardiogram 11/19/11: Difficult acoustic windows, EF 60-65%, normal LV wall thickness, grade 1 diastolic dysfunction. Of note, she does have primary hypoparathyroidism with hypocalcemia. She also has hypothyroidism and is status post thyroidectomy due to multinodular goiter.  She was seen by Wende Mott on 12/14.  Since then she has called in several times with complaints of HA as well as continued high BP.  She is now on Lopressor 25 bid as well as Amlodipine 10 She continues to complain of a L temporal HA which she says she has had since the hospital.   She notes intermittent chest pains.  SOme are worse when she lays a certain way in bed.  Others with activity.  No Known Allergies  Current Outpatient Prescriptions  Medication Sig Dispense Refill  . amLODipine (NORVASC) 10 MG tablet Take 1 tablet (10 mg total) by mouth daily.  90 tablet  3  . aspirin EC 81 MG tablet Take 81 mg by mouth daily.        . calcitRIOL (ROCALTROL) 0.25 MCG capsule Take 2 capsules (0.5 mcg total) by mouth 2 (two) times daily.  120 capsule  3  . calcium carbonate (TUMS - DOSED IN MG ELEMENTAL CALCIUM) 500 MG chewable tablet Chew 4 tablets by mouth 2 (two) times daily.       . clopidogrel (PLAVIX) 75 MG tablet Take 1 tablet (75 mg total) by mouth daily with breakfast.  90 tablet  3  . levothyroxine (SYNTHROID, LEVOTHROID) 125 MCG tablet Take 125 mcg by mouth daily.        . metoprolol tartrate (LOPRESSOR) 25 MG tablet 1 tab two times per day   90 tablet  3  . nitroGLYCERIN (NITROSTAT) 0.4 MG SL tablet Place 1 tablet (0.4 mg total) under the tongue every 5 (five) minutes as needed for chest pain (Up to 3 doses.).  25 tablet  4  . rosuvastatin (CRESTOR) 10 MG tablet  Take 1 tablet (10 mg total) by mouth daily.  30 tablet  6  . vitamin D, CHOLECALCIFEROL, 400 UNITS tablet Take 1 tablet (400 Units total) by mouth 2 (two) times daily.  60 tablet  1    Past Medical History  Diagnosis Date  . GERD (gastroesophageal reflux disease)     occasional  . Hyperlipidemia   . Hypertension     Echocardiogram 11/19/11: Difficult acoustic windows, EF 60-65%, normal LV wall thickness, grade 1 diastolic dysfunction  . Vertigo   . Tubular adenoma of colon 06/2011  . Colitis   . Multinodular thyroid     Goiter s/p thyroidectomy in 2007 with post-op  hypocalcemia and post-op hypothyroidism (?hypoparathyroidism)  . CAD (coronary artery disease)     NSTEMI 12/12: LHC 11/19/11: pLAD 30%, oOM 60%, AVCFX 30%, CFX after OM2 30%, pRCA 60 and 70%, then 99%, AM 80-90% with TIMI 3 flow.  PCI: Promus DES x 2 to RCA.  Marland Kitchen Post-surgical hypothyroidism   . Hypoparathyroidism     Past Surgical History  Procedure Date  . Colonoscopy Aenomatous polyps    07/05/2011  . Total thyroidectomy 2007    GOITER  . Shoulder arthroscopy w/ rotator cuff repair     right  Family History  Problem Relation Age of Onset  . Colon cancer Brother     History   Social History  . Marital Status: Legally Separated    Spouse Name: N/A    Number of Children: N/A  . Years of Education: N/A   Occupational History  . Not on file.   Social History Main Topics  . Smoking status: Never Smoker   . Smokeless tobacco: Never Used  . Alcohol Use: No  . Drug Use: No  . Sexually Active: Yes   Other Topics Concern  . Not on file   Social History Narrative  . No narrative on file    Review of Systems:  All systems reviewed.  They are negative to the above problem except as previously stated.  Vital Signs: BP 122/60  Pulse 60  Ht 5\' 5"  (1.651 m)  Wt 198 lb (89.812 kg)  BMI 32.95 kg/m2  Physical Exam  Patient is in NAD  HEENT:  Normocephalic, atraumatic. EOMI, PERRLA.  Neck: JVP is  normal. No thyromegaly. No bruits.  Lungs: clear to auscultation. No rales no wheezes.  Heart: Regular rate and rhythm. Normal S1, S2. No S3.   No significant murmurs. PMI not displaced.  Abdomen:  Supple, nontender. Normal bowel sounds. No masses. No hepatomegaly.  Extremities:   Good distal pulses throughout. No lower extremity edema.  Musculoskeletal :moving all extremities.  Neuro:   alert and oriented x3.  CN II-XII grossly intact.   Assessment and Plan:

## 2011-12-23 DIAGNOSIS — R51 Headache: Secondary | ICD-10-CM | POA: Insufficient documentation

## 2011-12-23 DIAGNOSIS — R519 Headache, unspecified: Secondary | ICD-10-CM | POA: Insufficient documentation

## 2011-12-23 NOTE — Assessment & Plan Note (Signed)
I would taper metoprolol for now an see if this is the cause of the HA.  I have asked her to call back with response.  Keep on amodipine.

## 2011-12-23 NOTE — Assessment & Plan Note (Signed)
No neurologic abnormalities on exam.  She says she has had since the cath.   Would get a noncontrast CT to rule out bleed.  I have also asked her to taper lopressor to off.  Call with response.

## 2011-12-23 NOTE — Assessment & Plan Note (Signed)
Signif dyslipidemia.  She needs to stay on Crestor.

## 2011-12-23 NOTE — Assessment & Plan Note (Signed)
Patient with intermitt chest pains.  I am not convinced they represent signif angina.  Some clearly is musculoskeletal.  I would keep on current regimen.  She needs to get into cardiac rehab.

## 2011-12-27 ENCOUNTER — Telehealth: Payer: Self-pay | Admitting: Internal Medicine

## 2011-12-27 NOTE — Telephone Encounter (Signed)
New msg [facility] is calling about crestor 10 mg- her ins wants her to try generic. Please call back

## 2011-12-27 NOTE — Telephone Encounter (Signed)
REQUESTING PRIOR AUTHOR FORM

## 2011-12-27 NOTE — Telephone Encounter (Signed)
I CALLED TO GET PRIOR AUTHORIZATION FORM SO DR ROSS CAN FILL IT OUT. PT SHOULD CONTINUE ON CRESTOR .BUT  PRIOR AUTHORIZATION FORM MUST BE FILLED OUT FIRST

## 2012-01-03 ENCOUNTER — Encounter (HOSPITAL_COMMUNITY)
Admission: RE | Admit: 2012-01-03 | Discharge: 2012-01-03 | Disposition: A | Discharge status: Home / Self Care | Payer: 59 | Source: Ambulatory Visit | Attending: Internal Medicine | Admitting: Internal Medicine

## 2012-01-03 DIAGNOSIS — E785 Hyperlipidemia, unspecified: Secondary | ICD-10-CM | POA: Insufficient documentation

## 2012-01-03 DIAGNOSIS — I2510 Atherosclerotic heart disease of native coronary artery without angina pectoris: Secondary | ICD-10-CM | POA: Insufficient documentation

## 2012-01-03 DIAGNOSIS — Z7982 Long term (current) use of aspirin: Secondary | ICD-10-CM | POA: Insufficient documentation

## 2012-01-03 DIAGNOSIS — I252 Old myocardial infarction: Secondary | ICD-10-CM | POA: Insufficient documentation

## 2012-01-03 DIAGNOSIS — Z79899 Other long term (current) drug therapy: Secondary | ICD-10-CM | POA: Insufficient documentation

## 2012-01-03 DIAGNOSIS — K219 Gastro-esophageal reflux disease without esophagitis: Secondary | ICD-10-CM | POA: Insufficient documentation

## 2012-01-03 DIAGNOSIS — Z5189 Encounter for other specified aftercare: Secondary | ICD-10-CM | POA: Insufficient documentation

## 2012-01-03 DIAGNOSIS — E209 Hypoparathyroidism, unspecified: Secondary | ICD-10-CM | POA: Insufficient documentation

## 2012-01-03 DIAGNOSIS — Z7902 Long term (current) use of antithrombotics/antiplatelets: Secondary | ICD-10-CM | POA: Insufficient documentation

## 2012-01-03 DIAGNOSIS — Z9861 Coronary angioplasty status: Secondary | ICD-10-CM | POA: Insufficient documentation

## 2012-01-03 DIAGNOSIS — G8929 Other chronic pain: Secondary | ICD-10-CM | POA: Insufficient documentation

## 2012-01-03 DIAGNOSIS — M79609 Pain in unspecified limb: Secondary | ICD-10-CM | POA: Insufficient documentation

## 2012-01-04 ENCOUNTER — Telehealth: Payer: Self-pay | Admitting: Internal Medicine

## 2012-01-04 NOTE — Telephone Encounter (Signed)
Called patient and advised that form was faxed again to [facility] cardiac rehab with additional information. She will go to cardiac rehab on Monday as planned.

## 2012-01-04 NOTE — Telephone Encounter (Signed)
New Problem:     Patient called in stating that Dr. [name] signed her up for cardiac rehab but the rehab facility called and told her that the rehab had not been confirmed yet.  Rehab center would like a fax confirming the Cardiac Rehab that she is supposed to be starting Monday.

## 2012-01-07 ENCOUNTER — Encounter (HOSPITAL_COMMUNITY): Payer: Self-pay | Admitting: *Deleted

## 2012-01-07 ENCOUNTER — Encounter (HOSPITAL_COMMUNITY)
Admission: RE | Admit: 2012-01-07 | Discharge: 2012-01-07 | Disposition: A | Discharge status: Home / Self Care | Payer: 59 | Source: Ambulatory Visit | Attending: Internal Medicine | Admitting: Internal Medicine

## 2012-01-07 NOTE — Telephone Encounter (Signed)
New Msg: [name] from Cardiac Rehab calling regarding pt c/o chest discomfort/pain when on the bicycle. Please return call to discuss further. Pt is not c/o pain at the present time however pt is in the office.

## 2012-01-07 NOTE — Progress Notes (Signed)
Patient complained of having chest pain on the bike. [name] on the bike at cardiac rehab today.  Telemetry normal sinus rhythm.  [name] says she was eight minutes into exercise when she experienced th e pain.  Chest pain resolved with rest. Dr Charlott Rakes office called and notified. Spoke with Dr [name]. Dr [name] said the pain was atypical and it is okay for the patient to go home. [name] left cardiac rehab without any complaints or discomfort.  Medications reconcilled.

## 2012-01-07 NOTE — Telephone Encounter (Signed)
Called [name] at cardiac rehab. Patient pain free now without EKG changes. She will call Dr.Ross for advise.  Pre authorization form was faxed to clear her for Crestor. Awaiting response.

## 2012-01-09 ENCOUNTER — Encounter (HOSPITAL_COMMUNITY)
Admission: RE | Admit: 2012-01-09 | Discharge: 2012-01-09 | Disposition: A | Discharge status: Home / Self Care | Payer: 59 | Source: Ambulatory Visit | Attending: Internal Medicine | Admitting: Internal Medicine

## 2012-01-09 NOTE — Progress Notes (Signed)
[name] 66 y.o. female       Nutrition Screen                                                                    YES  NO Do you live in a nursing home?  X   Do you eat out more than 3 times/week?    X If yes, how many times per week do you eat out?  Do you have food allergies?   X If yes, what are you allergic to?  Have you gained or lost more than 10 lbs without trying?               X If yes, how much weight have you lost and over what time period?  lbs gained or lost over  weeks/month  Do you want to lose weight?    X  If yes, what is a goal weight or amount of weight you would like to lose? 20 lbs  Do you eat alone most of the time?   X   Do you eat less than 2 meals/day?  X If yes, how many meals do you eat?  Do you drink more than 3 alcohol drinks/day?  X If yes, how many drinks per day?  Are you having trouble with constipation? *  X If yes, what are you doing to help relieve constipation?  Do you have financial difficulties with buying food?*    X   Are you experiencing regular nausea/ vomiting?*     X   Do you have a poor appetite? *                                        X   Do you have trouble chewing/swallowing? *   X    Pt with diagnoses of:     X Stent/ PTCA X GERD          X Dyslipidemia  / HDL< 40 / LDL>70 / High TG      X %  Body fat >goal / Body Mass Index >25 X HTN / BP >120/80 X MI X Chron's       Pt Risk Score   1       Diagnosis Risk Score  80       Total Risk Score   81                        X High Risk                Low Risk              HT: 63.75" Ht Readings from Last 1 Encounters:  01/03/12 5' 3.75" (1.619 m)    WT:   192.9 lb (87.7 kg) Wt Readings from Last 3 Encounters:  01/03/12 193 lb 5.5 oz (87.7 kg)  12/21/11 198 lb (89.812 kg)  12/07/11 198 lb (89.812 kg)     IBW 54 162%IBW BMI 33.4 44.7%body fat   Meds reviewed: MVI, Vitamin D  Past Medical History  Diagnosis Date  . GERD (gastroesophageal reflux disease)  occasional  . Hyperlipidemia   . Hypertension     Echocardiogram 11/19/11: Difficult acoustic windows, EF 60-65%, normal LV wall thickness, grade 1 diastolic dysfunction  . Vertigo   . Tubular adenoma of colon 06/2011  . Colitis   . Multinodular thyroid     Goiter s/p thyroidectomy in 2007 with post-op  hypocalcemia and post-op hypothyroidism (?hypoparathyroidism)  . CAD (coronary artery disease)     NSTEMI 12/12: LHC 11/19/11: pLAD 30%, oOM 60%, AVCFX 30%, CFX after OM2 30%, pRCA 60 and 70%, then 99%, AM 80-90% with TIMI 3 flow.  PCI: Promus DES x 2 to RCA.  Marland Kitchen Post-surgical hypothyroidism   . Hypoparathyroidism         Activity level: Pt is moderately active  Wt goal: 168-180 lb ( 76.4-81.8 kg) Current tobacco use? No      Food/Drug Interaction? No Labs:  Lipid Panel     Component Value Date/Time   CHOL 248* 11/20/2011 0004   TRIG 189* 11/20/2011 0004   HDL 37* 11/20/2011 0004   CHOLHDL 6.7 11/20/2011 0004   VLDL 38 11/20/2011 0004   LDLCALC 173* 11/20/2011 0004   No results found for this basename: HGBA1C   12/21/11 Glucose 95   LDL goal: < 70      MI and > 2:      HTN, HDL, family h/o, >65 yo female   Estimated Daily Nutrition Needs for: ? wt loss  1200-1600 Kcal , Total Fat 30-45gm, Saturated Fat 8-12 gm, Trans Fat 1.1-1.6 gm,  Sodium less than 1500 mg

## 2012-01-11 ENCOUNTER — Encounter (HOSPITAL_COMMUNITY): Payer: 59

## 2012-01-14 ENCOUNTER — Encounter (HOSPITAL_COMMUNITY)
Admission: RE | Admit: 2012-01-14 | Discharge: 2012-01-14 | Disposition: A | Discharge status: Home / Self Care | Payer: 59 | Source: Ambulatory Visit | Attending: Internal Medicine | Admitting: Internal Medicine

## 2012-01-16 ENCOUNTER — Encounter (HOSPITAL_COMMUNITY)
Admission: RE | Admit: 2012-01-16 | Discharge: 2012-01-16 | Disposition: A | Discharge status: Home / Self Care | Payer: 59 | Source: Ambulatory Visit | Attending: Internal Medicine | Admitting: Internal Medicine

## 2012-01-16 NOTE — Progress Notes (Signed)
[name] has a follow up appointment with Dr [name] on 01/21/12. Will send exercise flow sheets via Careers information officer.

## 2012-01-16 NOTE — Progress Notes (Signed)
Reviewed home exercise with pt today.  Pt plans to walk and return to [facility] for exercise.  Reviewed THR, pulse, RPE, sign and symptoms, NTG use, and when to call 911 or MD.  Pt voiced understanding. Fabio Pierce, MA, ACSM RCEP

## 2012-01-16 NOTE — Progress Notes (Signed)
Spoke with pt per Gladstone Lighter, RN, request.  Pt unable to attend Tuesday Nutrition Classes due to transportation issues.  Pt given handouts for Nutrition Class 1 and 2.  Pt to read materials and follow-up with this writer if she has questions or concerns.  Pt expressed understanding.  Continue client-centered nutrition education by RD as part of interdisciplinary care.  Monitor and evaluate progress toward nutrition goal with team.

## 2012-01-18 ENCOUNTER — Encounter (HOSPITAL_COMMUNITY): Payer: 59

## 2012-01-18 ENCOUNTER — Other Ambulatory Visit (INDEPENDENT_AMBULATORY_CARE_PROVIDER_SITE_OTHER): Payer: 59 | Admitting: *Deleted

## 2012-01-18 DIAGNOSIS — E785 Hyperlipidemia, unspecified: Secondary | ICD-10-CM

## 2012-01-18 LAB — LIPID PANEL
HDL: 38.7 mg/dL — ABNORMAL LOW (ref >39.00)
Total CHOL/HDL Ratio: 4
VLDL: 24.8 mg/dL (ref 0.0–40.0)

## 2012-01-18 LAB — HEPATIC FUNCTION PANEL
AST: 17 U/L (ref 0–37)
Alkaline Phosphatase: 86 U/L (ref 39–117)
Bilirubin, Direct: 0 mg/dL (ref 0.0–0.3)
Total Protein: 7.3 g/dL (ref 6.0–8.3)

## 2012-01-21 ENCOUNTER — Ambulatory Visit (INDEPENDENT_AMBULATORY_CARE_PROVIDER_SITE_OTHER): Payer: 59 | Admitting: Internal Medicine

## 2012-01-21 ENCOUNTER — Encounter: Payer: Self-pay | Admitting: Internal Medicine

## 2012-01-21 ENCOUNTER — Encounter (HOSPITAL_COMMUNITY)
Admission: RE | Admit: 2012-01-21 | Discharge: 2012-01-21 | Disposition: A | Discharge status: Home / Self Care | Payer: 59 | Source: Ambulatory Visit | Attending: Internal Medicine | Admitting: Internal Medicine

## 2012-01-21 DIAGNOSIS — E785 Hyperlipidemia, unspecified: Secondary | ICD-10-CM

## 2012-01-21 DIAGNOSIS — F32A Depression, unspecified: Secondary | ICD-10-CM | POA: Insufficient documentation

## 2012-01-21 DIAGNOSIS — I2510 Atherosclerotic heart disease of native coronary artery without angina pectoris: Secondary | ICD-10-CM

## 2012-01-21 DIAGNOSIS — F329 Major depressive disorder, single episode, unspecified: Secondary | ICD-10-CM | POA: Insufficient documentation

## 2012-01-21 DIAGNOSIS — I10 Essential (primary) hypertension: Secondary | ICD-10-CM

## 2012-01-21 DIAGNOSIS — R51 Headache: Secondary | ICD-10-CM

## 2012-01-21 NOTE — Assessment & Plan Note (Signed)
I am not convince fo active angina.  Continue meds.

## 2012-01-21 NOTE — Assessment & Plan Note (Signed)
Good control

## 2012-01-21 NOTE — Progress Notes (Signed)
HPI [name] is a 66 y.o. female with history of CAD. She was admitted 11/26-11/29 with an NSTEMI. LHC 11/19/11: pLAD 30%, oOM 60%, AVCFX 30%, CFX after OM2 30%, pRCA 60 and 70%, then 99%, AM 80-90% with TIMI 3 flow. PCI: Promus DES x 2 to RCA. Echocardiogram 11/19/11: EF 60-65%, normal LV wall thickness, grade 1 diastolic dysfunction. Of note, she does have primary hypoparathyroidism with hypocalcemia. She also has hypothyroidism and is status post thyroidectomy due to multinodular goiter.   I saw [name] in December.  She was complaining of signif HA.  CT was done that was negative.  I rec she taper off of lopressor. She was having CP which did not appear to be cardiac. Since seen the patient continues to complain of L frontal HA.  Also complains of intermittent CP.  Not always associated with activity.  She says she is under increased stress. Enjoys cardiac rehab.  Finances a proble.  Wiling to start work.  Would like to work regular hours. No Known Allergies  Current Outpatient Prescriptions  Medication Sig Dispense Refill  . amLODipine (NORVASC) 10 MG tablet Take 1 tablet (10 mg total) by mouth daily.  90 tablet  3  . aspirin EC 81 MG tablet Take 81 mg by mouth daily.        . calcitRIOL (ROCALTROL) 0.25 MCG capsule Take 2 capsules (0.5 mcg total) by mouth 2 (two) times daily.  120 capsule  3  . calcium carbonate (TUMS - DOSED IN MG ELEMENTAL CALCIUM) 500 MG chewable tablet Chew 4 tablets by mouth 2 (two) times daily.       . clopidogrel (PLAVIX) 75 MG tablet Take 1 tablet (75 mg total) by mouth daily with breakfast.  90 tablet  3  . famotidine (PEPCID) 20 MG tablet Take 1 tablet (20 mg total) by mouth daily.      Marland Kitchen levothyroxine (SYNTHROID, LEVOTHROID) 125 MCG tablet Take 125 mcg by mouth daily.        . nitroGLYCERIN (NITROSTAT) 0.4 MG SL tablet Place 1 tablet (0.4 mg total) under the tongue every 5 (five) minutes as needed for chest pain (Up to 3 doses.).  25 tablet  4  .  rosuvastatin (CRESTOR) 10 MG tablet Take 1 tablet (10 mg total) by mouth daily.  90 tablet  3  . vitamin D, CHOLECALCIFEROL, 400 UNITS tablet Take 1 tablet (400 Units total) by mouth 2 (two) times daily.  60 tablet  1    Past Medical History  Diagnosis Date  . GERD (gastroesophageal reflux disease)     occasional  . Hyperlipidemia   . Hypertension     Echocardiogram 11/19/11: Difficult acoustic windows, EF 60-65%, normal LV wall thickness, grade 1 diastolic dysfunction  . Vertigo   . Tubular adenoma of colon 06/2011  . Colitis   . Multinodular thyroid     Goiter s/p thyroidectomy in 2007 with post-op  hypocalcemia and post-op hypothyroidism (?hypoparathyroidism)  . CAD (coronary artery disease)     NSTEMI 12/12: LHC 11/19/11: pLAD 30%, oOM 60%, AVCFX 30%, CFX after OM2 30%, pRCA 60 and 70%, then 99%, AM 80-90% with TIMI 3 flow.  PCI: Promus DES x 2 to RCA.  Marland Kitchen Post-surgical hypothyroidism   . Hypoparathyroidism     Past Surgical History  Procedure Date  . Colonoscopy Aenomatous polyps    07/05/2011  . Total thyroidectomy 2007    GOITER  . Shoulder arthroscopy w/ rotator cuff repair  right    Family History  Problem Relation Age of Onset  . Colon cancer Brother     History   Social History  . Marital Status: Legally Separated    Spouse Name: N/A    Number of Children: N/A  . Years of Education: N/A   Occupational History  . Not on file.   Social History Main Topics  . Smoking status: Never Smoker   . Smokeless tobacco: Never Used  . Alcohol Use: No  . Drug Use: No  . Sexually Active: Yes   Other Topics Concern  . Not on file   Social History Narrative  . No narrative on file    Review of Systems:  All systems reviewed.  They are negative to the above problem except as previously stated.  Vital Signs: BP 130/73  Pulse 70  Ht 5\' 5"  (1.651 m)  Wt 195 lb (88.451 kg)  BMI 32.45 kg/m2  Physical Exam  Patient is in NAD  HEENT:  Normocephalic,  atraumatic. EOMI, PERRLA.  Neck: JVP is normal. No thyromegaly. No bruits.  Lungs: clear to auscultation. No rales no wheezes.  Heart: Regular rate and rhythm. Normal S1, S2. No S3.   No significant murmurs. PMI not displaced.  Abdomen:  Supple, nontender. Normal bowel sounds. No masses. No hepatomegaly.  Extremities:   Good distal pulses throughout. No lower extremity edema.  Musculoskeletal :moving all extremities.  Neuro:   alert and oriented x3.  CN II-XII grossly intact.   Assessment and Plan:

## 2012-01-21 NOTE — Progress Notes (Signed)
[name] 66 y.o. female Nutrition Note  Spoke with pt.  Nutrition Plan and Nutrition Survey reviewed with pt.  Pt is a Pescetarian and is following a step 2 heart healthy diet. Weight loss tips discussed.  Pt requested information re: vegetarian protein sources.  Dietary sources of protein reviewed.  Pt expressed understanding. Pt high risk according to nutrition screen due to Chron's disease.  Feel pt low risk given pt tolerating diet well at this time. Nutrition Diagnosis   Food-and nutrition-related knowledge deficit related to lack of exposure to information as related to diagnosis of: ? CVD    Obesity related to excessive energy intake as evidenced by a BMI of 33.4  Nutrition RX/ Estimated Daily Nutrition Needs for: wt loss 1200-1600 Kcal, 30-45 gm fat, 8-12 gm sat fat, 1.1-1.6 gm trans-fat, <1500 mg sodium Nutrition Intervention   Pt's individual nutrition plan including cholesterol goals reviewed with pt.   Benefits of adopting Therapeutic Lifestyle Changes discussed when Medficts reviewed.   Pt to attend the Portion Distortion class   Pt given handouts for"                    ? wt loss ? Nutrition I class ? Nutrition II class   Continue client-centered nutrition education by RD, as part of interdisciplinary care. Goal(s)   Pt to identify food quantities necessary to achieve: ? wt loss to a goal wt of 168-180 lb (76.4-81.8 kg)  at graduation from cardiac rehab.  Monitor and Evaluate progress toward nutrition goal with team.

## 2012-01-21 NOTE — Assessment & Plan Note (Signed)
Keep on statin. 

## 2012-01-21 NOTE — Assessment & Plan Note (Signed)
Work up is negative.

## 2012-01-21 NOTE — Patient Instructions (Signed)
Your physician wants you to follow-up [id number] will receive a reminder letter in the mail two months in advance. If you don't receive a letter, please call our office to schedule the follow-up appointment.   Dr.Gutterman at [facility]  [phone]

## 2012-01-21 NOTE — Assessment & Plan Note (Signed)
I have encouraged her to go to D. Gutterman.  Since her MI she seems very depressed.  She does not wnt to take medicines.  I think she would benefit from therapy.

## 2012-01-23 ENCOUNTER — Encounter (HOSPITAL_COMMUNITY)
Admission: RE | Admit: 2012-01-23 | Discharge: 2012-01-23 | Disposition: A | Discharge status: Home / Self Care | Payer: 59 | Source: Ambulatory Visit | Attending: Internal Medicine | Admitting: Internal Medicine

## 2012-01-23 ENCOUNTER — Telehealth: Payer: Self-pay | Admitting: Internal Medicine

## 2012-01-23 NOTE — Telephone Encounter (Signed)
New Problem:     Patient called in today wanting Dr. [name] to extend her disability leave for another two months and needs it done ASAP because she is fatigued, sweating, has a headache, is dizzy, and had trouble breathing.  Please call back.

## 2012-01-23 NOTE — Telephone Encounter (Signed)
Called patient back. She returned to work back at [employer] today and feels that she has gone back to work too soon after being out on disability. She has to stand all day and feels tired,weak,dizzy,and complaining of headaches. She would like her disability extended for another 2 months. Advised will discuss with Dr.Ross on 2/1 and call her back.

## 2012-01-25 ENCOUNTER — Encounter (HOSPITAL_COMMUNITY)
Admission: RE | Admit: 2012-01-25 | Discharge: 2012-01-25 | Disposition: A | Discharge status: Home / Self Care | Payer: 59 | Source: Ambulatory Visit | Attending: Internal Medicine | Admitting: Internal Medicine

## 2012-01-25 DIAGNOSIS — Z9861 Coronary angioplasty status: Secondary | ICD-10-CM | POA: Insufficient documentation

## 2012-01-25 DIAGNOSIS — Z7902 Long term (current) use of antithrombotics/antiplatelets: Secondary | ICD-10-CM | POA: Insufficient documentation

## 2012-01-25 DIAGNOSIS — E209 Hypoparathyroidism, unspecified: Secondary | ICD-10-CM | POA: Insufficient documentation

## 2012-01-25 DIAGNOSIS — G8929 Other chronic pain: Secondary | ICD-10-CM | POA: Insufficient documentation

## 2012-01-25 DIAGNOSIS — Z79899 Other long term (current) drug therapy: Secondary | ICD-10-CM | POA: Insufficient documentation

## 2012-01-25 DIAGNOSIS — M79609 Pain in unspecified limb: Secondary | ICD-10-CM | POA: Insufficient documentation

## 2012-01-25 DIAGNOSIS — E785 Hyperlipidemia, unspecified: Secondary | ICD-10-CM | POA: Insufficient documentation

## 2012-01-25 DIAGNOSIS — Z5189 Encounter for other specified aftercare: Secondary | ICD-10-CM | POA: Insufficient documentation

## 2012-01-25 DIAGNOSIS — I252 Old myocardial infarction: Secondary | ICD-10-CM | POA: Insufficient documentation

## 2012-01-25 DIAGNOSIS — Z7982 Long term (current) use of aspirin: Secondary | ICD-10-CM | POA: Insufficient documentation

## 2012-01-25 DIAGNOSIS — I2510 Atherosclerotic heart disease of native coronary artery without angina pectoris: Secondary | ICD-10-CM | POA: Insufficient documentation

## 2012-01-25 DIAGNOSIS — K219 Gastro-esophageal reflux disease without esophagitis: Secondary | ICD-10-CM | POA: Insufficient documentation

## 2012-01-28 ENCOUNTER — Encounter (HOSPITAL_COMMUNITY): Payer: 59

## 2012-01-28 NOTE — Telephone Encounter (Signed)
Called and left message on machine with the following information. Dr.Ross will support short term disability for another month and will assess again. Foward any paperwork to [place] to complete.

## 2012-01-30 ENCOUNTER — Encounter (HOSPITAL_COMMUNITY)
Admission: RE | Admit: 2012-01-30 | Discharge: 2012-01-30 | Disposition: A | Discharge status: Home / Self Care | Payer: 59 | Source: Ambulatory Visit | Attending: Internal Medicine | Admitting: Internal Medicine

## 2012-02-01 ENCOUNTER — Encounter (HOSPITAL_COMMUNITY)
Admission: RE | Admit: 2012-02-01 | Discharge: 2012-02-01 | Disposition: A | Discharge status: Home / Self Care | Payer: 59 | Source: Ambulatory Visit | Attending: Internal Medicine | Admitting: Internal Medicine

## 2012-02-04 ENCOUNTER — Encounter (HOSPITAL_COMMUNITY): Payer: 59

## 2012-02-06 ENCOUNTER — Encounter (HOSPITAL_COMMUNITY)
Admission: RE | Admit: 2012-02-06 | Discharge: 2012-02-06 | Disposition: A | Discharge status: Home / Self Care | Payer: 59 | Source: Ambulatory Visit | Attending: Internal Medicine | Admitting: Internal Medicine

## 2012-02-06 NOTE — Progress Notes (Signed)
Maicy plans to come to exercise twice a week instead of 3 days a week at cardiac rehab.

## 2012-02-08 ENCOUNTER — Encounter (HOSPITAL_COMMUNITY): Payer: 59

## 2012-02-11 ENCOUNTER — Encounter (HOSPITAL_COMMUNITY): Payer: 59

## 2012-02-13 ENCOUNTER — Encounter (HOSPITAL_COMMUNITY): Payer: 59

## 2012-02-15 ENCOUNTER — Encounter (HOSPITAL_COMMUNITY): Payer: 59

## 2012-02-18 ENCOUNTER — Encounter (HOSPITAL_COMMUNITY): Payer: 59

## 2012-02-20 ENCOUNTER — Encounter (HOSPITAL_COMMUNITY): Payer: 59

## 2012-02-22 ENCOUNTER — Telehealth: Payer: Self-pay | Admitting: Internal Medicine

## 2012-02-22 ENCOUNTER — Encounter (HOSPITAL_COMMUNITY): Payer: 59

## 2012-02-22 ENCOUNTER — Telehealth (HOSPITAL_COMMUNITY): Payer: Self-pay | Admitting: *Deleted

## 2012-02-22 NOTE — Telephone Encounter (Signed)
Called and left message with dental office (answering machine) that it is OK to have crowns done but must stay on Plavix per Dr.Ross.

## 2012-02-22 NOTE — Telephone Encounter (Signed)
Pt is on plavix and will be getting 3 crowns and they want to get clearance

## 2012-02-25 ENCOUNTER — Encounter (HOSPITAL_COMMUNITY): Payer: 59

## 2012-02-27 ENCOUNTER — Encounter (HOSPITAL_COMMUNITY): Payer: 59

## 2012-02-29 ENCOUNTER — Encounter (HOSPITAL_COMMUNITY): Payer: 59

## 2012-03-03 ENCOUNTER — Encounter (HOSPITAL_COMMUNITY)
Admission: RE | Admit: 2012-03-03 | Discharge: 2012-03-03 | Disposition: A | Discharge status: Home / Self Care | Payer: 59 | Source: Ambulatory Visit | Attending: Internal Medicine | Admitting: Internal Medicine

## 2012-03-03 DIAGNOSIS — Z9861 Coronary angioplasty status: Secondary | ICD-10-CM | POA: Insufficient documentation

## 2012-03-03 DIAGNOSIS — Z7902 Long term (current) use of antithrombotics/antiplatelets: Secondary | ICD-10-CM | POA: Insufficient documentation

## 2012-03-03 DIAGNOSIS — Z7982 Long term (current) use of aspirin: Secondary | ICD-10-CM | POA: Insufficient documentation

## 2012-03-03 DIAGNOSIS — E209 Hypoparathyroidism, unspecified: Secondary | ICD-10-CM | POA: Insufficient documentation

## 2012-03-03 DIAGNOSIS — Z5189 Encounter for other specified aftercare: Secondary | ICD-10-CM | POA: Insufficient documentation

## 2012-03-03 DIAGNOSIS — Z79899 Other long term (current) drug therapy: Secondary | ICD-10-CM | POA: Insufficient documentation

## 2012-03-03 DIAGNOSIS — M79609 Pain in unspecified limb: Secondary | ICD-10-CM | POA: Insufficient documentation

## 2012-03-03 DIAGNOSIS — G8929 Other chronic pain: Secondary | ICD-10-CM | POA: Insufficient documentation

## 2012-03-03 DIAGNOSIS — E785 Hyperlipidemia, unspecified: Secondary | ICD-10-CM | POA: Insufficient documentation

## 2012-03-03 DIAGNOSIS — K219 Gastro-esophageal reflux disease without esophagitis: Secondary | ICD-10-CM | POA: Insufficient documentation

## 2012-03-03 DIAGNOSIS — I2510 Atherosclerotic heart disease of native coronary artery without angina pectoris: Secondary | ICD-10-CM | POA: Insufficient documentation

## 2012-03-03 DIAGNOSIS — I252 Old myocardial infarction: Secondary | ICD-10-CM | POA: Insufficient documentation

## 2012-03-03 NOTE — Progress Notes (Signed)
[name]'s last day of attendance was 02/06/2012 will discharge from the program at this time for nonattendance.

## 2012-03-05 ENCOUNTER — Encounter (HOSPITAL_COMMUNITY): Payer: 59

## 2012-03-06 NOTE — Progress Notes (Addendum)
Cardiac Rehabilitation Program Progress Report   Orientation:  01/03/2012  Discharge Date:  03/03/2012 # of sessions completed: 10/36  Cardiologist: [name] Family MD:  Beatris Ship Class Time:  0945   A.  Exercise Program:  Tolerates exercise @ 3.1 METS for 30 minutes, Bike Test Results:  Pre: 0.84 mile, Poor attendance due to work schedule, transportation,  and lack of motivation, Needs encouragement on exercise program and Discharged to home exercise program.  Anticipated compliance:  fair  B.  Mental Health:  Good mental attitude and Quality of Life (QOL)  Pre Scores Only:  Overall  24.07, Health/Functioning 23.43, Socioeconomics 22.64, Psych/Spiritual 23.36, Family 27.60    C.  Education/Instruction/Skills  Accurately checks own pulse.  Rest:  67  Exercise:  125, Knows THR for exercise, Uses Perceived Exertion Scale and/or Dyspnea Scale and Attended 1/13 education classes  Home exercise given: 01/16/2012  D.  Nutrition/Weight Control/Body Composition:  Adherence to prescribed nutrition program: good   *This section completed by Mickle Plumb, Andres Shad, RD, LDN, CDE  E.  Blood Lipids Lab Results  Component Value Date   CHOL 153 01/18/2012   HDL 38.70* 01/18/2012   LDLCALC 90 01/18/2012   LDLDIRECT 188.4 07/11/2011   TRIG 124.0 01/18/2012   CHOLHDL 4 01/18/2012    F.  Lifestyle Changes:  Making positive lifestyle changes  G.  Symptoms noted with exercise:  Angina exertional on 01/07/2012 on bike, resolved with rest    Report Completed By:  Hazle Nordmann   Comments:  Pt did fairly well while in program progressing from 2.5 METst to 3.1 METs.  She was dropped due to her lack of attendance during the last month because of returning to work.  Pt plans to continue to exercise at the [facility].  Prior to d/c pt was in sinus rhythm.  Thanks for the referral. Fabio Pierce, MA, ACSM RCEP

## 2012-03-07 ENCOUNTER — Encounter (HOSPITAL_COMMUNITY): Payer: 59

## 2012-03-10 ENCOUNTER — Encounter (HOSPITAL_COMMUNITY): Payer: 59

## 2012-03-12 ENCOUNTER — Encounter (HOSPITAL_COMMUNITY): Payer: 59

## 2012-03-14 ENCOUNTER — Telehealth: Payer: Self-pay | Admitting: *Deleted

## 2012-03-14 ENCOUNTER — Other Ambulatory Visit: Payer: Self-pay | Admitting: *Deleted

## 2012-03-14 ENCOUNTER — Encounter (HOSPITAL_COMMUNITY): Payer: 59

## 2012-03-14 MED ORDER — FAMOTIDINE 20 MG PO TABS: 20.0000 mg | ORAL_TABLET | Freq: Every day | ORAL | Status: DC

## 2012-03-14 MED ORDER — CLOPIDOGREL BISULFATE 75 MG PO TABS: 75.0000 mg | ORAL_TABLET | Freq: Every day | ORAL | Status: DC

## 2012-03-14 MED ORDER — AMLODIPINE BESYLATE 10 MG PO TABS: 10.0000 mg | ORAL_TABLET | Freq: Every day | ORAL | Status: DC

## 2012-03-14 MED ORDER — ROSUVASTATIN CALCIUM 10 MG PO TABS: 10.0000 mg | ORAL_TABLET | Freq: Every day | ORAL | Status: DC

## 2012-03-14 NOTE — Telephone Encounter (Signed)
LMOM that refills were sent to mail away pharmacy. Question about Calcitriol. She states takes 4 times per day. Chart says 2 times per day. Question who has prescribed medication for her. LM for her to call back to clarify.

## 2012-03-17 ENCOUNTER — Encounter (HOSPITAL_COMMUNITY): Payer: 59

## 2012-03-19 ENCOUNTER — Encounter (HOSPITAL_COMMUNITY): Payer: 59

## 2012-03-21 ENCOUNTER — Encounter (HOSPITAL_COMMUNITY): Payer: 59

## 2012-03-24 ENCOUNTER — Encounter (HOSPITAL_COMMUNITY): Payer: 59

## 2012-03-26 ENCOUNTER — Encounter (HOSPITAL_COMMUNITY): Payer: 59

## 2012-03-28 ENCOUNTER — Encounter (HOSPITAL_COMMUNITY): Payer: 59

## 2012-03-31 ENCOUNTER — Encounter (HOSPITAL_COMMUNITY): Payer: 59

## 2012-04-01 ENCOUNTER — Other Ambulatory Visit: Payer: 59

## 2012-04-02 ENCOUNTER — Encounter (HOSPITAL_COMMUNITY): Payer: 59

## 2012-04-03 NOTE — Progress Notes (Signed)
Agree with progress report from cardiac rehab on 03/06/2012 written by Trevor Mace and and Heloise Purpura.

## 2012-04-04 ENCOUNTER — Encounter (HOSPITAL_COMMUNITY): Payer: 59

## 2012-04-07 ENCOUNTER — Encounter (HOSPITAL_COMMUNITY): Payer: 59

## 2012-04-07 ENCOUNTER — Encounter: Payer: Self-pay | Admitting: Internal Medicine

## 2012-04-07 ENCOUNTER — Ambulatory Visit (INDEPENDENT_AMBULATORY_CARE_PROVIDER_SITE_OTHER): Payer: 59 | Admitting: Internal Medicine

## 2012-04-07 VITALS — BP 130/70 | HR 76 | Ht 66.0 in | Wt 190.0 lb

## 2012-04-07 DIAGNOSIS — R002 Palpitations: Secondary | ICD-10-CM

## 2012-04-07 DIAGNOSIS — E782 Mixed hyperlipidemia: Secondary | ICD-10-CM

## 2012-04-07 DIAGNOSIS — I2510 Atherosclerotic heart disease of native coronary artery without angina pectoris: Secondary | ICD-10-CM

## 2012-04-07 LAB — LIPID PANEL
Cholesterol: 160 mg/dL (ref 0–200)
HDL: 44.5 mg/dL (ref >39.00)
Triglycerides: 118 mg/dL (ref 0.0–149.0)
VLDL: 23.6 mg/dL (ref 0.0–40.0)

## 2012-04-07 LAB — BASIC METABOLIC PANEL
Calcium: 8.8 mg/dL (ref 8.4–10.5)
Chloride: 104 mEq/L (ref 96–112)
Creatinine, Ser: 1.1 mg/dL (ref 0.4–1.2)

## 2012-04-07 LAB — AST: AST: 17 U/L (ref 0–37)

## 2012-04-07 LAB — TSH: TSH: 0.79 u[IU]/mL (ref 0.35–5.50)

## 2012-04-07 NOTE — Progress Notes (Signed)
HPI [name] is a 66 y.o. female with history of CAD. She was admitted 11/26-11/29 with an NSTEMI. LHC 11/19/11: pLAD 30%, oOM 60%, AVCFX 30%, CFX after OM2 30%, pRCA 60 and 70%, then 99%, AM 80-90% with TIMI 3 flow. PCI: Promus DES x 2 to RCA. Echocardiogram 11/19/11: EF 60-65%, normal LV wall thickness, grade 1 diastolic dysfunction. Of note, she does have primary hypoparathyroidism with hypocalcemia. She also has hypothyroidism and is status post thyroidectomy due to multinodular goiter.  I last saw her in January. Notes that she feels her heart beating fast   Occurs every 2 days.  Gets SOB  Lasts 3 to 4 minutes.  Goes away grad.   No Known Allergies  Current Outpatient Prescriptions  Medication Sig Dispense Refill  . amLODipine (NORVASC) 10 MG tablet Take 1 tablet (10 mg total) by mouth daily.  90 tablet  3  . aspirin EC 81 MG tablet Take 81 mg by mouth daily.        . calcitRIOL (ROCALTROL) 0.25 MCG capsule Take 2 capsules (0.5 mcg total) by mouth 2 (two) times daily.  120 capsule  3  . calcium carbonate (TUMS - DOSED IN MG ELEMENTAL CALCIUM) 500 MG chewable tablet Chew 4 tablets by mouth 2 (two) times daily.       . clopidogrel (PLAVIX) 75 MG tablet Take 1 tablet (75 mg total) by mouth daily with breakfast.  90 tablet  3  . famotidine (PEPCID) 20 MG tablet Take 1 tablet (20 mg total) by mouth daily.  90 tablet  3  . levothyroxine (SYNTHROID, LEVOTHROID) 125 MCG tablet Take 125 mcg by mouth daily.        . nitroGLYCERIN (NITROSTAT) 0.4 MG SL tablet Place 1 tablet (0.4 mg total) under the tongue every 5 (five) minutes as needed for chest pain (Up to 3 doses.).  25 tablet  4  . rosuvastatin (CRESTOR) 10 MG tablet Take 1 tablet (10 mg total) by mouth daily.  90 tablet  3  . vitamin D, CHOLECALCIFEROL, 400 UNITS tablet Take 1 tablet (400 Units total) by mouth 2 (two) times daily.  60 tablet  1    Past Medical History  Diagnosis Date  . GERD (gastroesophageal reflux disease)    occasional  . Hyperlipidemia   . Hypertension     Echocardiogram 11/19/11: Difficult acoustic windows, EF 60-65%, normal LV wall thickness, grade 1 diastolic dysfunction  . Vertigo   . Tubular adenoma of colon 06/2011  . Colitis   . Multinodular thyroid     Goiter s/p thyroidectomy in 2007 with post-op  hypocalcemia and post-op hypothyroidism (?hypoparathyroidism)  . CAD (coronary artery disease)     NSTEMI 12/12: LHC 11/19/11: pLAD 30%, oOM 60%, AVCFX 30%, CFX after OM2 30%, pRCA 60 and 70%, then 99%, AM 80-90% with TIMI 3 flow.  PCI: Promus DES x 2 to RCA.  Marland Kitchen Post-surgical hypothyroidism   . Hypoparathyroidism     Past Surgical History  Procedure Date  . Colonoscopy Aenomatous polyps    07/05/2011  . Total thyroidectomy 2007    GOITER  . Shoulder arthroscopy w/ rotator cuff repair     right    Family History  Problem Relation Age of Onset  . Colon cancer Brother     History   Social History  . Marital Status: Legally Separated    Spouse Name: N/A    Number of Children: N/A  . Years of Education: N/A   Occupational History  .  Not on file.   Social History Main Topics  . Smoking status: Never Smoker   . Smokeless tobacco: Never Used  . Alcohol Use: No  . Drug Use: No  . Sexually Active: Yes   Other Topics Concern  . Not on file   Social History Narrative  . No narrative on file    Review of Systems:  All systems reviewed.  They are negative to the above problem except as previously stated.  Vital Signs: There were no vitals taken for this visit.  Physical Exam  HEENT:  Normocephalic, atraumatic. EOMI, PERRLA.  Neck: JVP is normal. No thyromegaly. No bruits.  Lungs: clear to auscultation. No rales no wheezes.  Heart: Regular rate and rhythm. Normal S1, S2. No S3.   No significant murmurs. PMI not displaced.  Abdomen:  Supple, nontender. Normal bowel sounds. No masses. No hepatomegaly.  Extremities:   Good distal pulses throughout. No lower extremity  edema.  Musculoskeletal :moving all extremities.  Neuro:   alert and oriented x3.  CN II-XII grossly intact.   Assessment and Plan:

## 2012-04-07 NOTE — Patient Instructions (Signed)
Lab work today We will call you with results.  Your physician wants you to follow-up in: end of AUGUST 2013You will receive a reminder letter in the mail two months in advance. If you don't receive a letter, please call our office to schedule the follow-up appointment.   Your physician has recommended that you wear an event monitor. Event monitors are medical devices that record the heart's electrical activity. Doctors most often [place] these monitors to diagnose arrhythmias. Arrhythmias are problems with the speed or rhythm of the heartbeat. The monitor is a small, portable device. You can wear one while you do your normal daily activities. This is usually used to diagnose what is causing palpitations/syncope (passing out).

## 2012-04-07 NOTE — Telephone Encounter (Signed)
Patient seen on 04/07/2012 and labs drawn to check calcium level.

## 2012-04-08 LAB — VITAMIN D 25 HYDROXY (VIT D DEFICIENCY, FRACTURES): Vit D, 25-Hydroxy: 58 ng/mL (ref 30–89)

## 2012-04-09 ENCOUNTER — Encounter (HOSPITAL_COMMUNITY): Payer: 59

## 2012-04-09 ENCOUNTER — Encounter (INDEPENDENT_AMBULATORY_CARE_PROVIDER_SITE_OTHER): Payer: 59

## 2012-04-09 DIAGNOSIS — R002 Palpitations: Secondary | ICD-10-CM

## 2012-04-11 ENCOUNTER — Encounter (HOSPITAL_COMMUNITY): Payer: 59

## 2012-04-14 ENCOUNTER — Ambulatory Visit (HOSPITAL_COMMUNITY): Payer: 59

## 2012-04-16 ENCOUNTER — Ambulatory Visit (HOSPITAL_COMMUNITY): Payer: 59

## 2012-04-18 ENCOUNTER — Ambulatory Visit (HOSPITAL_COMMUNITY): Payer: 59

## 2012-04-18 ENCOUNTER — Telehealth: Payer: Self-pay | Admitting: Internal Medicine

## 2012-04-18 NOTE — Telephone Encounter (Signed)
Spoke with pt who stated she is experiencing swelling in feet bilat.--no other complaints--asked if any new meds --pt states she was taken off metoprolol , and put back on amlodipine, but that is only change--advised i would speak to dr [name] and phone her back with instructions--pt agrees--nt

## 2012-04-18 NOTE — Telephone Encounter (Signed)
3-4 days feet swelling, no pain  pls advise [phone]

## 2012-04-18 NOTE — Telephone Encounter (Signed)
She has been on NOrvasc for awhile.  When I saw her on 4/15 she had no LE edema QUestion if she has more salt intake.  Is she on her feet more. Look at those things.  I would not discontinue Norvasc for now.

## 2012-04-18 NOTE — Telephone Encounter (Signed)
Called patient and discussed the issue of swelling in the lower extremities. She saw Dr.Featherston last Wednesday as a follow up for "vein problems". She also has pain in the knee which she states that she received an injection from him last week. The swelling started soon after she saw him. She has been wearing thigh high support hose for many years. Has had injections in her veins in the past. Advised her that Dr.Ross felt that the Norvasc was not causing the swelling because she has been on this medication since December and was on in for many years in the past without problems. She denies any increase salt intake. She will call Dr.Featherston about leg swelling and check to see if she is using the correct strength of support hose. Left samples of Crestor 10 mg at front desk for her to pick up. Will let Dr.Ross know about above information.

## 2012-04-21 ENCOUNTER — Ambulatory Visit (HOSPITAL_COMMUNITY): Payer: 59

## 2012-04-23 ENCOUNTER — Encounter: Payer: Self-pay | Admitting: Gynecology

## 2012-04-23 ENCOUNTER — Ambulatory Visit (HOSPITAL_COMMUNITY): Payer: 59

## 2012-04-23 ENCOUNTER — Ambulatory Visit (INDEPENDENT_AMBULATORY_CARE_PROVIDER_SITE_OTHER): Payer: 59 | Admitting: Gynecology

## 2012-04-23 ENCOUNTER — Other Ambulatory Visit: Payer: Self-pay | Admitting: Gynecology

## 2012-04-23 VITALS — BP 140/80

## 2012-04-23 DIAGNOSIS — N390 Urinary tract infection, site not specified: Secondary | ICD-10-CM

## 2012-04-23 DIAGNOSIS — R350 Frequency of micturition: Secondary | ICD-10-CM

## 2012-04-23 DIAGNOSIS — R102 Pelvic and perineal pain: Secondary | ICD-10-CM

## 2012-04-23 DIAGNOSIS — R300 Dysuria: Secondary | ICD-10-CM

## 2012-04-23 DIAGNOSIS — IMO0001 Reserved for inherently not codable concepts without codable children: Secondary | ICD-10-CM

## 2012-04-23 DIAGNOSIS — N949 Unspecified condition associated with female genital organs and menstrual cycle: Secondary | ICD-10-CM

## 2012-04-23 LAB — URINALYSIS W MICROSCOPIC + REFLEX CULTURE
Casts: NONE SEEN
Crystals: NONE SEEN
Glucose, UA: NEGATIVE mg/dL
Ketones, ur: NEGATIVE mg/dL
Nitrite: NEGATIVE
Specific Gravity, Urine: <1.005 — ABNORMAL LOW (ref 1.005–1.030)
pH: 7 (ref 5.0–8.0)

## 2012-04-23 MED ORDER — NITROFURANTOIN MONOHYD MACRO 100 MG PO CAPS: 100.0000 mg | ORAL_CAPSULE | Freq: Two times a day (BID) | ORAL | Status: AC

## 2012-04-23 NOTE — Patient Instructions (Signed)

## 2012-04-23 NOTE — Progress Notes (Signed)
Patient presented to the office today complaining of several days of frequency, dysuria, and suprapubic pressure. Patient denied any back pain some mild chills but no reported fevers. She denied nausea or vomiting.  Exam: Abdomen: Suprapubic tenderness Pelvic: Bartholin urethra Skene was within normal limits Vagina: No lesions or discharge Cervix: No lesions or discharge Uterus: Anteverted suprapubic tenderness limited exam Adnexa: Limited due to suprapubic tenderness Rectal: Not examined  Urinalysis 3-6 CBC, 0-2 RBC, and rare bacteria  Assessment/plan: Based on patient's symptomatology she may be experiencing an early urinary tract infection she will be placed on Macrobid one by mouth twice a day for 7 days and Uribell anti-spasmodic agent 1 tablet 3 times a day for 2-3 days when necessary. She'll return back next week for her annual exam and we'll check her urine at that point. She was encouraged to increase her fluid intake and literature information was provided on subject.

## 2012-04-25 ENCOUNTER — Telehealth: Payer: Self-pay | Admitting: *Deleted

## 2012-04-25 ENCOUNTER — Ambulatory Visit (HOSPITAL_COMMUNITY): Payer: 59

## 2012-04-25 LAB — URINE CULTURE: Colony Count: 9000

## 2012-04-25 NOTE — Telephone Encounter (Signed)
Note mailed to pt

## 2012-04-25 NOTE — Telephone Encounter (Signed)
Will leave at front desk

## 2012-04-25 NOTE — Telephone Encounter (Signed)
Pt was seen in office for UTI, she has missed three days of work. Pt would like note to bring to work due to UTI.  Note will be mailed to pt per request. Please advise

## 2012-04-28 ENCOUNTER — Ambulatory Visit (HOSPITAL_COMMUNITY): Payer: 59

## 2012-04-30 ENCOUNTER — Ambulatory Visit (HOSPITAL_COMMUNITY): Payer: 59

## 2012-05-02 ENCOUNTER — Ambulatory Visit (HOSPITAL_COMMUNITY): Payer: 59

## 2012-05-05 ENCOUNTER — Ambulatory Visit (HOSPITAL_COMMUNITY): Payer: 59

## 2012-05-07 ENCOUNTER — Ambulatory Visit (HOSPITAL_COMMUNITY): Payer: 59

## 2012-05-07 ENCOUNTER — Telehealth: Payer: Self-pay | Admitting: *Deleted

## 2012-05-07 NOTE — Telephone Encounter (Signed)
Advised pt to take extra strength tylenol for back pain--pt agrees--nt

## 2012-05-07 NOTE — Telephone Encounter (Signed)
Pt wants to know from a cardiac standpoint if she can take aleve or ibuprofen for her back pain?

## 2012-05-07 NOTE — Telephone Encounter (Signed)
Fu call Pt wants to discuss with a nurse

## 2012-05-08 ENCOUNTER — Telehealth: Payer: Self-pay | Admitting: *Deleted

## 2012-05-08 NOTE — Telephone Encounter (Signed)
Called patient to follow up on event monitor. Patient only transmitted baseline rhythm. She states that she used it on three different episodes. Will check with Windell Moulding and call her back tomorrow.

## 2012-05-09 ENCOUNTER — Ambulatory Visit (HOSPITAL_COMMUNITY): Payer: 59

## 2012-05-09 ENCOUNTER — Encounter: Payer: 59 | Admitting: Gynecology

## 2012-05-09 NOTE — Telephone Encounter (Signed)
Follow up from yesterday. [name] has found no rhythm strips from 4/17 thru 5/7. Only baseline. Patient must not have used monitor correctly. Called patient and she is aware of above. She does not want to try another monitor because of cost issue. Will call if anything changes.

## 2012-05-14 ENCOUNTER — Ambulatory Visit (INDEPENDENT_AMBULATORY_CARE_PROVIDER_SITE_OTHER): Payer: 59 | Admitting: Gynecology

## 2012-05-14 ENCOUNTER — Encounter: Payer: Self-pay | Admitting: Gynecology

## 2012-05-14 VITALS — BP 140/88 | Ht 63.25 in | Wt 193.5 lb

## 2012-05-14 DIAGNOSIS — N952 Postmenopausal atrophic vaginitis: Secondary | ICD-10-CM

## 2012-05-14 DIAGNOSIS — N951 Menopausal and female climacteric states: Secondary | ICD-10-CM

## 2012-05-14 DIAGNOSIS — Z01419 Encounter for gynecological examination (general) (routine) without abnormal findings: Secondary | ICD-10-CM

## 2012-05-14 MED ORDER — ESTRADIOL 10 MCG VA TABS: 1.0000 | ORAL_TABLET | VAGINAL | Status: DC

## 2012-05-14 NOTE — Patient Instructions (Signed)

## 2012-05-14 NOTE — Progress Notes (Signed)
[name] [birth year] [id number]   History:    66 y.o.  for annual gyn exam. Patient is complaining of vaginal dryness and irritation. Review of patient's record indicates that she's been followed by Dr. Lurene Shadow (endocrinologist) for hypothyroidism and hypoparathyroidism. Dr. [name] is been following her for her hypertension hypercholesterolemia and all labs were drawn recently. She is also being treated for hypertension as well. Patient last colonoscopy was in 2012 with adenomatous polyps removed. Her next colonoscopy is doing 3 years. Her last mammogram was in 2012 which was normal and her last bone density study was in 2011 which was normal  Past medical history,surgical history, family history and social history were all reviewed and documented in the EPIC chart.  Gynecologic History No LMP recorded. Patient is postmenopausal. Contraception: none Last Pap: 2012. Results were: normal Last mammogram: 2012. Results were: normal  Obstetric History OB History    Grav Para Term Preterm Abortions TAB SAB Ect Mult Living   4 4 4       4      # Outc Date GA Lbr Len/2nd Wgt Sex Del Anes PTL Lv   1 TRM     F SVD  No Yes   2 TRM     F SVD  No Yes   3 TRM     M SVD  No Yes   4 TRM     F SVD  No Yes       ROS: A ROS was performed and pertinent positives and negatives are included in the history.  GENERAL: No fevers or chills. HEENT: No change in vision, no earache, sore throat or sinus congestion. NECK: No pain or stiffness. CARDIOVASCULAR: No chest pain or pressure. No palpitations. PULMONARY: No shortness of breath, cough or wheeze. GASTROINTESTINAL: No abdominal pain, nausea, vomiting or diarrhea, melena or bright red blood per rectum. GENITOURINARY: No urinary frequency, urgency, hesitancy or dysuria. MUSCULOSKELETAL: No joint or muscle pain, no back pain, no recent trauma. DERMATOLOGIC: No rash, no itching, no lesions. ENDOCRINE: No polyuria, polydipsia, no heat or cold intolerance. No recent  change in weight. HEMATOLOGICAL: No anemia or easy bruising or bleeding. NEUROLOGIC: No headache, seizures, numbness, tingling or weakness. PSYCHIATRIC: No depression, no loss of interest in normal activity or change in sleep pattern.     Exam: chaperone present  BP 140/88  Ht 5' 3.25" (1.607 m)  Wt 193 lb 8 oz (87.771 kg)  BMI 34.01 kg/m2  Body mass index is 34.01 kg/(m^2).  General appearance : Well developed well nourished female. No acute distress HEENT: Neck supple, trachea midline, no carotid bruits, no thyroidmegaly Lungs: Clear to auscultation, no rhonchi or wheezes, or rib retractions  Heart: Regular rate and rhythm, no murmurs or gallops Breast:Examined in sitting and supine position were symmetrical in appearance, no palpable masses or tenderness,  no skin retraction, no nipple inversion, no nipple discharge, no skin discoloration, no axillary or supraclavicular lymphadenopathy Abdomen: no palpable masses or tenderness, no rebound or guarding Extremities: no edema or skin discoloration or tenderness  Pelvic:  Bartholin, Urethra, Skene Glands: Within normal limits             Vagina: No gross lesions or discharge, vaginal atrophy  Cervix: No gross lesions or discharge  Uterus  anteverted, normal size, shape and consistency, non-tender and mobile  Adnexa  Without masses or tenderness  Anus and perineum  normal   Rectovaginal  normal sphincter tone without palpated masses or tenderness  Hemoccult cards provided her to submit to the office for testing     Assessment/Plan:  66 y.o. female for annual exam with clinical evidence of vaginal atrophy. We discussed giving her vaginal estrogen to build up her vaginal epithelium to help with her symptoms. Risk benefits and pros and cons of the topical estrogen were discussed. She will be placed on the low dose of Vagifem 10 mcg to apply intravaginally twice a week. She was given a requisition to schedule her mammogram and  encouraged to do her monthly self breast examinations. She will need a bone density study in June of this year. She will continue her calcium and vitamin D along with weightbearing exercises for osteoporosis prevention. She was reminded to submit to the office the Hemoccult cards for testing.    [name] MD, 11:46 AM 05/14/2012

## 2012-05-22 ENCOUNTER — Emergency Department (HOSPITAL_COMMUNITY): Payer: 59

## 2012-05-22 ENCOUNTER — Observation Stay (HOSPITAL_COMMUNITY)
Admission: EM | Admit: 2012-05-22 | Discharge: 2012-05-23 | Disposition: A | Discharge status: Home / Self Care | Payer: 59 | Source: Ambulatory Visit | Attending: Internal Medicine | Admitting: Internal Medicine

## 2012-05-22 ENCOUNTER — Encounter (HOSPITAL_COMMUNITY): Payer: Self-pay | Admitting: *Deleted

## 2012-05-22 DIAGNOSIS — E785 Hyperlipidemia, unspecified: Secondary | ICD-10-CM | POA: Insufficient documentation

## 2012-05-22 DIAGNOSIS — E209 Hypoparathyroidism, unspecified: Secondary | ICD-10-CM | POA: Insufficient documentation

## 2012-05-22 DIAGNOSIS — I10 Essential (primary) hypertension: Secondary | ICD-10-CM | POA: Diagnosis present

## 2012-05-22 DIAGNOSIS — Z9861 Coronary angioplasty status: Secondary | ICD-10-CM | POA: Insufficient documentation

## 2012-05-22 DIAGNOSIS — I252 Old myocardial infarction: Secondary | ICD-10-CM | POA: Insufficient documentation

## 2012-05-22 DIAGNOSIS — E890 Postprocedural hypothyroidism: Secondary | ICD-10-CM | POA: Insufficient documentation

## 2012-05-22 DIAGNOSIS — R079 Chest pain, unspecified: Principal | ICD-10-CM | POA: Insufficient documentation

## 2012-05-22 DIAGNOSIS — E039 Hypothyroidism, unspecified: Secondary | ICD-10-CM | POA: Diagnosis present

## 2012-05-22 DIAGNOSIS — I2510 Atherosclerotic heart disease of native coronary artery without angina pectoris: Secondary | ICD-10-CM | POA: Diagnosis present

## 2012-05-22 HISTORY — DX: Palpitations: R00.2

## 2012-05-22 LAB — BASIC METABOLIC PANEL WITH GFR
BUN: 30 mg/dL — ABNORMAL HIGH (ref 6–23)
CO2: 29 meq/L (ref 19–32)
Chloride: 103 meq/L (ref 96–112)
Glucose, Bld: 91 mg/dL (ref 70–99)
Potassium: 4.2 meq/L (ref 3.5–5.1)

## 2012-05-22 LAB — POCT I-STAT TROPONIN I: Troponin i, poc: 0 ng/mL (ref 0.00–0.08)

## 2012-05-22 LAB — TROPONIN I
Troponin I: <0.3 ng/mL (ref <0.30)
Troponin I: <0.3 ng/mL (ref <0.30)

## 2012-05-22 LAB — BASIC METABOLIC PANEL
Calcium: 9.1 mg/dL (ref 8.4–10.5)
Creatinine, Ser: 1.15 mg/dL — ABNORMAL HIGH (ref 0.50–1.10)
GFR calc Af Amer: 57 mL/min — ABNORMAL LOW (ref >90)
GFR calc non Af Amer: 49 mL/min — ABNORMAL LOW (ref >90)
Sodium: 143 mEq/L (ref 135–145)

## 2012-05-22 LAB — CBC
HCT: 39.2 % (ref 36.0–46.0)
Hemoglobin: 13.2 g/dL (ref 12.0–15.0)
MCH: 28 pg (ref 26.0–34.0)
MCHC: 33.7 g/dL (ref 30.0–36.0)
MCV: 83.1 fL (ref 78.0–100.0)
Platelets: 219 10*3/uL (ref 150–400)
RBC: 4.72 MIL/uL (ref 3.87–5.11)
RDW: 14.9 % (ref 11.5–15.5)
WBC: 8.9 K/uL (ref 4.0–10.5)

## 2012-05-22 MED ORDER — NITROGLYCERIN 0.4 MG SL SUBL
0.4000 mg | SUBLINGUAL_TABLET | SUBLINGUAL | Status: DC | PRN
  Administered 2012-05-22: 0.4 mg via SUBLINGUAL
  Filled 2012-05-22: qty 25

## 2012-05-22 MED ORDER — ASPIRIN 325 MG PO TABS
325.0000 mg | ORAL_TABLET | ORAL | Status: AC
  Administered 2012-05-22: 325 mg via ORAL
  Filled 2012-05-22: qty 1

## 2012-05-22 MED ORDER — NITROGLYCERIN 2 % TD OINT
1.0000 [in_us] | TOPICAL_OINTMENT | Freq: Once | TRANSDERMAL | Status: AC
  Administered 2012-05-22: 1 [in_us] via TOPICAL
  Filled 2012-05-22: qty 1

## 2012-05-22 MED ORDER — GI COCKTAIL ~~LOC~~
30.0000 mL | Freq: Once | ORAL | Status: AC
  Administered 2012-05-22: 30 mL via ORAL
  Filled 2012-05-22: qty 30

## 2012-05-22 MED ORDER — MORPHINE SULFATE 2 MG/ML IJ SOLN: 2.0000 mg | Freq: Once | INTRAMUSCULAR | Status: DC

## 2012-05-22 NOTE — ED Notes (Signed)
PT. GETTING IRATE/IMPATIENT DUE TO WAIT , TRIAGE NURSE EXPLAINED DELAY /WAIT/  CENSUS AND PROCESS TO PT.

## 2012-05-22 NOTE — ED Notes (Signed)
Report given to Vickey Huger, Charity fundraiser.  Patient to move to CDU 2.

## 2012-05-22 NOTE — ED Notes (Signed)
Nitro paste RAChest

## 2012-05-22 NOTE — ED Notes (Signed)
Patient with chest pain that started this morning.  Patient took one sl nitro this am.  Patient states that her pain is a 5/10.  Patient denies any shortness of breath or nausea/vomiting.  Patient is CAOx3.  Husband at bedside.

## 2012-05-22 NOTE — ED Provider Notes (Deleted)
  I saw and evaluated the patient, reviewed the resident's note and I agree with the findings and plan.  I agree with his ECG interpretation.    Pt with recent MI and 2 stents placed about 6 months ago.  Pt has had similar sensation today and came to the ED.  Troponin first is neg, given recent MI and risks, will observe in hospital overnight.  Pain free now.  No STEMI on ECG.  Vital signs are stable.  ASA has been taken.    [name]. Oletta Lamas, MD 05/22/12 2222

## 2012-05-22 NOTE — ED Provider Notes (Signed)
I saw and evaluated the patient, reviewed the resident's note and I agree with the findings and plan.  I agree with his ECG interpretation.    Pt with recent MI and 2 stents placed about 6 months ago.  Pt has had similar sensation today and came to the ED.  Troponin first is neg, given recent MI and risks, will observe in hospital overnight.  Pain free now.  No STEMI on ECG.  Vital signs are stable.  ASA has been taken.    Gavin Pound. Oletta Lamas, MD 05/22/12 2222  Gavin Pound. Keoshia Steinmetz, MD 05/22/12 2310

## 2012-05-22 NOTE — ED Notes (Signed)
Pt is here for evaluation of CP which began 3 days ago.  She describes this as a pressure in her left chest.  No n/v or sob with this.  No relief with nitro at home

## 2012-05-22 NOTE — ED Provider Notes (Addendum)
History     CSN: [id number]  Arrival date & time 05/22/12  1850   None     Chief Complaint  Patient presents with  . Chest Pain    (Consider location/radiation/quality/duration/timing/severity/associated sxs/prior treatment) HPI Comments: L sided chest pressure in 5-10 minute episodes x 2 days.  Feels like prior MI.    Patient is a 66 y.o. female presenting with chest pain. The history is provided by the patient.  Chest Pain Episode onset: 2 days. Duration of episode(s) is 2 days. Chest pain occurs constantly. The chest pain is unchanged. Associated with: not worse with breathing or exertion. The severity of the pain is moderate. The quality of the pain is described as aching. The pain does not radiate. Pertinent negatives for primary symptoms include no fever, no shortness of breath, no cough, no abdominal pain, no nausea and no vomiting.     Past Medical History  Diagnosis Date  . GERD (gastroesophageal reflux disease)     occasional  . Hyperlipidemia   . Hypertension     Echocardiogram 11/19/11: Difficult acoustic windows, EF 60-65%, normal LV wall thickness, grade 1 diastolic dysfunction  . Vertigo   . Tubular adenoma of colon 06/2011  . Colitis   . Multinodular thyroid     Goiter s/p thyroidectomy in 2007 with post-op  hypocalcemia and post-op hypothyroidism (?hypoparathyroidism)  . CAD (coronary artery disease)     NSTEMI 12/12: LHC 11/19/11: pLAD 30%, oOM 60%, AVCFX 30%, CFX after OM2 30%, pRCA 60 and 70%, then 99%, AM 80-90% with TIMI 3 flow.  PCI: Promus DES x 2 to RCA.  Marland Kitchen Post-surgical hypothyroidism   . Hypoparathyroidism   . Myocardial infarction     Past Surgical History  Procedure Date  . Colonoscopy Aenomatous polyps    07/05/2011  . Total thyroidectomy 2007    GOITER  . Shoulder arthroscopy w/ rotator cuff repair     right    Family History  Problem Relation Age of Onset  . Colon cancer Brother   . Cancer Brother     COLON  . Hypertension Father      History  Substance Use Topics  . Smoking status: Never Smoker   . Smokeless tobacco: Never Used  . Alcohol Use: No    OB History    Grav Para Term Preterm Abortions TAB SAB Ect Mult Living   4 4 4       4       Review of Systems  Constitutional: Negative for fever and activity change.  HENT: Negative for congestion.   Eyes: Negative for visual disturbance.  Respiratory: Negative for cough, chest tightness and shortness of breath.   Cardiovascular: Positive for chest pain. Negative for leg swelling.  Gastrointestinal: Negative for nausea, vomiting and abdominal pain.  Genitourinary: Negative for dysuria.  Skin: Negative for rash.  Neurological: Negative for syncope.  Psychiatric/Behavioral: Negative for behavioral problems.    Allergies  Review of patient's allergies indicates no known allergies.  Home Medications   Current Outpatient Rx  Name Route Sig Dispense Refill  . AMLODIPINE BESYLATE 10 MG PO TABS Oral Take 1 tablet (10 mg total) by mouth daily. 90 tablet 3    DO NOT SEND ORDER TILL SHE CALLS FOR REORDER.  . ASPIRIN EC 81 MG PO TBEC Oral Take 81 mg by mouth daily.      Marland Kitchen CALCITRIOL 0.25 MCG PO CAPS Oral Take 2 capsules (0.5 mcg total) by mouth 2 (two) times daily. 120 capsule  3  . CALCIUM CARBONATE ANTACID 500 MG PO CHEW Oral Chew 4 tablets by mouth 2 (two) times daily.     Marland Kitchen CLOPIDOGREL BISULFATE 75 MG PO TABS Oral Take 1 tablet (75 mg total) by mouth daily with breakfast. 90 tablet 3  . ESTRADIOL 10 MCG VA TABS Vaginal Place 1 tablet (10 mcg total) vaginally 2 (two) times a week. 8 tablet 11  . FAMOTIDINE 20 MG PO TABS Oral Take 1 tablet (20 mg total) by mouth daily. 90 tablet 3  . LEVOTHYROXINE SODIUM 125 MCG PO TABS Oral Take 125 mcg by mouth daily.      Marland Kitchen NITROGLYCERIN 0.4 MG SL SUBL Sublingual Place 1 tablet (0.4 mg total) under the tongue every 5 (five) minutes as needed for chest pain (Up to 3 doses.). 25 tablet 4  . ROSUVASTATIN CALCIUM 10 MG PO TABS  Oral Take 1 tablet (10 mg total) by mouth daily. 90 tablet 3  . VITAMIN D 400 UNITS PO TABS Oral Take 1 tablet (400 Units total) by mouth 2 (two) times daily. 60 tablet 1    BP 107/55  Pulse 59  Temp(Src) 98 F (36.7 C) (Oral)  Resp 18  SpO2 97%  Physical Exam  Constitutional: She is oriented to person, place, and time. She appears well-developed and well-nourished.  HENT:  Head: Normocephalic and atraumatic.  Eyes: Conjunctivae and EOM are normal. Pupils are equal, round, and reactive to light. No scleral icterus.  Neck: Normal range of motion. Neck supple.  Cardiovascular: Normal rate and regular rhythm.  Exam reveals no gallop and no friction rub.   No murmur heard. Pulmonary/Chest: Effort normal and breath sounds normal. No respiratory distress. She has no wheezes. She has no rales. She exhibits no tenderness.  Abdominal: Soft. She exhibits no distension and no mass. There is no tenderness. There is no rebound and no guarding.  Musculoskeletal: Normal range of motion.  Neurological: She is alert and oriented to person, place, and time. She has normal reflexes. No cranial nerve deficit.  Skin: Skin is warm and dry. No rash noted.  Psychiatric: She has a normal mood and affect. Her behavior is normal. Judgment and thought content normal.    ED Course  Procedures (including critical care time)   Date: 05/22/2012  Rate: 66  Rhythm: normal sinus rhythm  QRS Axis: normal  Intervals: normal  ST/T Wave abnormalities: nonspecific ST changes  Conduction Disutrbances:none  Narrative Interpretation:   Old EKG Reviewed: unchanged    Labs Reviewed  BASIC METABOLIC PANEL - Abnormal; Notable for the following:    BUN 30 (*)    Creatinine, Ser 1.15 (*)    GFR calc non Af Amer 49 (*)    GFR calc Af Amer 57 (*)    All other components within normal limits  CBC  TROPONIN I  TROPONIN I   Dg Chest 2 View  05/22/2012  *RADIOLOGY REPORT*  Clinical Data: Chest pain for 2 days.   Hypertension.  CHEST - 2 VIEW  Comparison: 12/08/2011  Findings: Low lung volumes.  Retrocardiac opacity not present previously suspicious for early left lower lobe pneumonia.  Minimal right basilar atelectasis. Heart size within normal limits for degree of inspiration.  Mild degenerative change thoracic spine. Osteopenia.  IMPRESSION: Suspect early left lower lobe pneumonia.  Original Report Authenticated By: Elsie Stain, M.D.     1. Chest pain       MDM  L sided chest pressure in 5-10 minute episodes x  2 days.  Feels like prior MI.  VSS and well appearing.  EKG c/w prior.  Initial labs, CXR unconcerning.  Pt pain free after nitro.  D/w Corinda Gubler cards - recommended delta trop and if negative admission to hospitalist service.  CXR read as possible PNA.  No cough, no dyspnea, no fevers, no leukocytosis.  Doubt PNA.  2nd trop negative - hospitalist to admit.    Army Chaco, MD 05/22/12 [phone]

## 2012-05-23 ENCOUNTER — Observation Stay (HOSPITAL_COMMUNITY): Payer: 59

## 2012-05-23 ENCOUNTER — Encounter (HOSPITAL_COMMUNITY): Payer: Self-pay | Admitting: *Deleted

## 2012-05-23 DIAGNOSIS — I2510 Atherosclerotic heart disease of native coronary artery without angina pectoris: Secondary | ICD-10-CM

## 2012-05-23 DIAGNOSIS — E209 Hypoparathyroidism, unspecified: Secondary | ICD-10-CM

## 2012-05-23 DIAGNOSIS — R079 Chest pain, unspecified: Secondary | ICD-10-CM

## 2012-05-23 DIAGNOSIS — E032 Hypothyroidism due to medicaments and other exogenous substances: Secondary | ICD-10-CM

## 2012-05-23 LAB — CBC
MCHC: 32.9 g/dL (ref 30.0–36.0)
MCV: 83.2 fL (ref 78.0–100.0)
Platelets: 183 10*3/uL (ref 150–400)
RDW: 15.1 % (ref 11.5–15.5)
WBC: 6 10*3/uL (ref 4.0–10.5)

## 2012-05-23 LAB — BASIC METABOLIC PANEL
BUN: 24 mg/dL — ABNORMAL HIGH (ref 6–23)
CO2: 26 mEq/L (ref 19–32)
Calcium: 7.9 mg/dL — ABNORMAL LOW (ref 8.4–10.5)
Creatinine, Ser: 1.04 mg/dL (ref 0.50–1.10)
GFR calc Af Amer: 64 mL/min — ABNORMAL LOW (ref >90)

## 2012-05-23 LAB — CARDIAC PANEL(CRET KIN+CKTOT+MB+TROPI)
Relative Index: INVALID (ref 0.0–2.5)
Relative Index: INVALID (ref 0.0–2.5)
Total CK: 81 U/L (ref 7–177)
Troponin I: <0.3 ng/mL (ref <0.30)
Troponin I: <0.3 ng/mL (ref <0.30)

## 2012-05-23 MED ORDER — CALCIUM CARBONATE ANTACID 500 MG PO CHEW
4.0000 | CHEWABLE_TABLET | Freq: Two times a day (BID) | ORAL | Status: DC
  Administered 2012-05-23: 800 mg via ORAL
  Filled 2012-05-23 (×2): qty 4

## 2012-05-23 MED ORDER — UNABLE TO FIND: Status: DC

## 2012-05-23 MED ORDER — ASPIRIN EC 325 MG PO TBEC
325.0000 mg | DELAYED_RELEASE_TABLET | Freq: Every day | ORAL | Status: DC
  Administered 2012-05-23: 325 mg via ORAL
  Filled 2012-05-23: qty 1

## 2012-05-23 MED ORDER — ESTRADIOL 0.1 MG/GM VA CREA: 1.0000 | TOPICAL_CREAM | VAGINAL | Status: DC

## 2012-05-23 MED ORDER — ESTRADIOL 10 MCG VA TABS: 1.0000 | ORAL_TABLET | VAGINAL | Status: DC

## 2012-05-23 MED ORDER — ONDANSETRON HCL 4 MG/2ML IJ SOLN: 4.0000 mg | Freq: Four times a day (QID) | INTRAMUSCULAR | Status: DC | PRN

## 2012-05-23 MED ORDER — CLOPIDOGREL BISULFATE 75 MG PO TABS
75.0000 mg | ORAL_TABLET | Freq: Every day | ORAL | Status: DC
  Administered 2012-05-23: 75 mg via ORAL
  Filled 2012-05-23: qty 1

## 2012-05-23 MED ORDER — MORPHINE SULFATE 2 MG/ML IJ SOLN: 2.0000 mg | INTRAMUSCULAR | Status: DC | PRN

## 2012-05-23 MED ORDER — CALCITRIOL 0.5 MCG PO CAPS
0.5000 ug | ORAL_CAPSULE | Freq: Two times a day (BID) | ORAL | Status: DC
  Administered 2012-05-23: 0.5 ug via ORAL
  Filled 2012-05-23 (×2): qty 1

## 2012-05-23 MED ORDER — DOCUSATE SODIUM 100 MG PO CAPS
100.0000 mg | ORAL_CAPSULE | Freq: Two times a day (BID) | ORAL | Status: DC
  Administered 2012-05-23: 100 mg via ORAL
  Filled 2012-05-23 (×2): qty 1

## 2012-05-23 MED ORDER — SODIUM CHLORIDE 0.9 % IJ SOLN
3.0000 mL | Freq: Two times a day (BID) | INTRAMUSCULAR | Status: DC
  Administered 2012-05-23: 3 mL via INTRAVENOUS

## 2012-05-23 MED ORDER — AMLODIPINE BESYLATE 10 MG PO TABS
10.0000 mg | ORAL_TABLET | Freq: Every day | ORAL | Status: DC
  Administered 2012-05-23: 10 mg via ORAL
  Filled 2012-05-23: qty 1

## 2012-05-23 MED ORDER — ACETAMINOPHEN 325 MG PO TABS: 650.0000 mg | ORAL_TABLET | Freq: Four times a day (QID) | ORAL | Status: DC | PRN

## 2012-05-23 MED ORDER — SODIUM CHLORIDE 0.9 % IJ SOLN: 3.0000 mL | Freq: Two times a day (BID) | INTRAMUSCULAR | Status: DC

## 2012-05-23 MED ORDER — VITAMIN D 400 UNITS PO TABS
400.0000 [IU] | ORAL_TABLET | Freq: Two times a day (BID) | ORAL | Status: DC
  Administered 2012-05-23: 400 [IU] via ORAL
  Filled 2012-05-23 (×2): qty 1

## 2012-05-23 MED ORDER — POLYETHYLENE GLYCOL 3350 17 G PO PACK
17.0000 g | PACK | Freq: Every day | ORAL | Status: DC | PRN
  Filled 2012-05-23: qty 1

## 2012-05-23 MED ORDER — SODIUM CHLORIDE 0.9 % IV SOLN: 250.0000 mL | INTRAVENOUS | Status: DC | PRN

## 2012-05-23 MED ORDER — ACETAMINOPHEN 650 MG RE SUPP: 650.0000 mg | Freq: Four times a day (QID) | RECTAL | Status: DC | PRN

## 2012-05-23 MED ORDER — ONDANSETRON HCL 4 MG PO TABS: 4.0000 mg | ORAL_TABLET | Freq: Four times a day (QID) | ORAL | Status: DC | PRN

## 2012-05-23 MED ORDER — SENNA 8.6 MG PO TABS
1.0000 | ORAL_TABLET | Freq: Two times a day (BID) | ORAL | Status: DC
  Administered 2012-05-23: 8.6 mg via ORAL
  Filled 2012-05-23 (×2): qty 1

## 2012-05-23 MED ORDER — ATORVASTATIN CALCIUM 20 MG PO TABS
20.0000 mg | ORAL_TABLET | Freq: Every day | ORAL | Status: DC
  Filled 2012-05-23: qty 1

## 2012-05-23 MED ORDER — SODIUM CHLORIDE 0.9 % IJ SOLN: 3.0000 mL | INTRAMUSCULAR | Status: DC | PRN

## 2012-05-23 MED ORDER — POTASSIUM CHLORIDE CRYS ER 20 MEQ PO TBCR
40.0000 meq | EXTENDED_RELEASE_TABLET | Freq: Once | ORAL | Status: AC
  Administered 2012-05-23: 40 meq via ORAL
  Filled 2012-05-23: qty 2

## 2012-05-23 MED ORDER — LEVOTHYROXINE SODIUM 125 MCG PO TABS
125.0000 ug | ORAL_TABLET | Freq: Every day | ORAL | Status: DC
  Administered 2012-05-23: 125 ug via ORAL
  Filled 2012-05-23 (×2): qty 1

## 2012-05-23 NOTE — Procedures (Addendum)
Objective Swallowing Evaluation: Modified Barium Swallowing Study  Patient Details  Name: [name] MRN: [id number] Date of Birth: 10/03/1946  Today's Date: 05/23/2012 Time: 1300-1320 SLP Time Calculation (min): 20 min  Past Medical History:  Past Medical History  Diagnosis Date  . GERD (gastroesophageal reflux disease)     occasional  . Hyperlipidemia   . Hypertension     Echocardiogram 11/19/11: Difficult acoustic windows, EF 60-65%, normal LV wall thickness, grade 1 diastolic dysfunction  . Vertigo   . Tubular adenoma of colon 06/2011  . Colitis   . Multinodular thyroid     Goiter s/p thyroidectomy in 2007 with post-op  hypocalcemia and post-op hypothyroidism/hypoparathyroidism with hypocalcemia  . CAD (coronary artery disease)     a) NSTEMI 10/2011: LHC 11/19/11: pLAD 30%, oOM 60%, AVCFX 30%, CFX after OM2 30%, pRCA 60 and 70%, then 99%, AM 80-90% with TIMI 3 flow.  PCI: Promus DES x 2 to RCA.  Marland Kitchen Post-surgical hypothyroidism   . Hypoparathyroidism   . Palpitations     a) 03/2012 - patient set up for event monitor but did not wear correctly -  she declined wearing a repeat monitor   Past Surgical History:  Past Surgical History  Procedure Date  . Colonoscopy Aenomatous polyps    07/05/2011  . Total thyroidectomy 2007    GOITER  . Shoulder arthroscopy w/ rotator cuff repair     right   HPI:  66 yr old admitted with chest pain, rule out MI.  History of thyroid surgery.  Had MBS 7/09 revealing vallecular residue without penetration or aspiration with coughing from pt.   SLP noted pt. appeared to have a possible vocal cord disorder.  Pt. reports she saw ENT and per pt's description noted a paralyzed vocal cord, recommend surgery (silicone) which pt. did not follow through with.  She presents today with possible left lower pna and continues to have dysphagia symptoms of something stuck in her throat.  MD started her on reflux meds which she feels are helpful.  She reports an  episode of food sticking in throat and actually coming out oral cavity (one episode).  SLP cannot find diagnosis of esophageal dysmotility, reflux, etc..  These symptoms may be esophageal related, however, given history and recent pna, would recommend starting with an MBS.     Assessment / Plan / Recommendation Clinical Impression  Dysphagia Diagnosis: Within Functional Limits Clinical impression: No impairments identified in pt.'s oral or pharyngeal phase of swallowing.  Oral prep, mastication and transit to pharynx were WFL's.  Swallow initiation, laryngeal elevation and pharyngeal contraction all WFL's.  Brief esophageal sxan revealed possible slow transit and hesitation with what appears to be mild stasis at proximal esophagus with slight appearance of being somewhat narrow (MBS cannot diagnose abnormalities below the UES).  Barium was observed to ascend esophagus (reflux).  Recommend she continue on a regular diet with thin liquids with reflux precautions.  Pt. would benefit from further assessment of esophageal structure and function for possible stricture (?).       Treatment Recommendation  No treatment recommended at this time    Diet Recommendation Regular;Thin liquid   Liquid Administration via: Cup Medication Administration: Whole meds with liquid Supervision: Patient able to self feed Compensations: Slow rate;Small sips/bites;Follow solids with liquid Postural Changes and/or Swallow Maneuvers:  (relux precautuions)    Other  Recommendations Oral Care Recommendations: Oral care BID   Follow Up Recommendations  None  General HPI:  66 yr old admitted with chest pain, rule out MI.  History of thyroid surgery.  Had MBS 7/09 revealing vallecular residue without penetration or aspiration with coughing from pt.   SLP noted pt. appeared to have a possible vocal cord disorder.  Pt. reports she saw ENT and per pt's description noted a paralyzed vocal cord, recommend surgery  (silicone) which pt. did not follow through with.  She presents today with possible left lower pna and continues to have dysphagia symptoms of something stuck in her throat.  MD started her on reflux meds which she feels are helpful.  She reports an episode of food sticking in throat and actually coming out oral cavity (one episode).  SLP cannot find diagnosis of esophageal dysmotility, reflux, etc..  These symptoms may be esophageal related, however, given history and recent pna, would recommend starting with an MBS. Type of Study: Modified Barium Swallowing Study Reason for Referral: Objectively evaluate swallowing function Diet Prior to this Study: Regular;Thin liquids Temperature Spikes Noted: No Respiratory Status: Room air History of Recent Intubation: No Behavior/Cognition: Alert;Cooperative;Pleasant mood Oral Cavity - Dentition: Adequate natural dentition Oral Motor / Sensory Function: Within functional limits Patient Positioning: Upright in chair Baseline Vocal Quality: Hoarse (gravely, has paralyzed vocal cord) Volitional Cough: Weak Volitional Swallow: Able to elicit Anatomy: Within functional limits Pharyngeal Secretions: Not observed secondary MBS    Reason for Referral Objectively evaluate swallowing function   Oral Phase     Pharyngeal Phase Pharyngeal Phase: Within functional limits   Cervical Esophageal Phase Cervical Esophageal Phase: [facility]    Darrow Bussing.Ed [employer] [phone]  05/23/2012

## 2012-05-23 NOTE — Progress Notes (Signed)
Subjective:   Chart reviewed. Patient gives history of 2 types of discomfort which started 2 days ago. She gives a sensation of heaviness in the epigastric region and a dull pain in the precordial region which started on Wednesday. Both of these are intermittent and each episode lasting less than a minute. They are nonradiating. No associated dyspnea, cough, fever or chills. No history of recent travels or asymmetrical leg swelling or pain. She does give history of intermittent bilateral ankle swellings. She says she lifted more than the usual weights on Monday when she did 10 pounds weight training instead of 6 pounds. She did experience some discomfort after the weight training in the same areas. She also gives a 3 year history of intermittent coughing while drinking and swallowing, since her thyroid surgery. Patient has no PCP. She last saw her cardiologist on 04/12/12.  Objective  Vital signs in last 24 hours: Filed Vitals:   05/22/12 2230 05/23/12 0104 05/23/12 0150 05/23/12 0555  BP: 116/54 112/56 125/68 97/38  Pulse:  64 60 61  Temp:   97.7 F (36.5 C) 97.8 F (36.6 C)  TempSrc:   Oral Oral  Resp: 12 19 18 16   Height:   5\' 4"  (1.626 m)   Weight:   86.773 kg (191 lb 4.8 oz)   SpO2: 100% 95% 92% 98%   Weight change:  No intake or output data in the 24 hours ending 05/23/12 0814  Physical Exam:  General Exam: Comfortable.  Respiratory System: Clear. No increased work of breathing. ? Reproducible chest pain left lower parasternal region. Cardiovascular System: First and second heart sounds heard. Regular rate and rhythm. No JVD/murmurs. Telemetry shows sinus rhythm in the 60s to 70s without arrhythmia alarms.  Gastrointestinal System: Abdomen is non distended, soft and normal bowel sounds heard. Nontender. Central Nervous System: Alert and oriented. No focal neurological deficits. Extremities: Symmetric 5 x 5 power. No pedal edema.  Labs:  Basic Metabolic Panel:  Lab 05/23/12  0530 05/22/12 1859  NA 141 143  K 3.4* 4.2  CL 104 103  CO2 26 29  GLUCOSE 89 91  BUN 24* 30*  CREATININE 1.04 1.15*  CALCIUM 7.9* 9.1  ALB -- --  PHOS -- --   Liver Function Tests: No results found for this basename: AST:3,ALT:3,ALKPHOS:3,BILITOT:3,PROT:3,ALBUMIN:3 in the last 168 hours No results found for this basename: LIPASE:3,AMYLASE:3 in the last 168 hours No results found for this basename: AMMONIA:3 in the last 168 hours CBC:  Lab 05/23/12 0530 05/22/12 1859  WBC 6.0 8.9  NEUTROABS -- --  HGB 11.7* 13.2  HCT 35.6* 39.2  MCV 83.2 83.1  PLT 183 219   Cardiac Enzymes:  Lab 05/23/12 0530 05/22/12 2201 05/22/12 1859  CKTOTAL 91 -- --  CKMB 1.7 -- --  CKMBINDEX -- -- --  TROPONINI <0.30 <0.30 <0.30   CBG: No results found for this basename: GLUCAP:5 in the last 168 hours  Iron Studies: No results found for this basename: IRON,TIBC,TRANSFERRIN,FERRITIN in the last 72 hours Studies/Results: Dg Chest 2 View  05/22/2012  *RADIOLOGY REPORT*  Clinical Data: Chest pain for 2 days.  Hypertension.  CHEST - 2 VIEW  Comparison: 12/08/2011  Findings: Low lung volumes.  Retrocardiac opacity not present previously suspicious for early left lower lobe pneumonia.  Minimal right basilar atelectasis. Heart size within normal limits for degree of inspiration.  Mild degenerative change thoracic spine. Osteopenia.  IMPRESSION: Suspect early left lower lobe pneumonia.  Original Report Authenticated By: Elsie Stain,  M.D.   Medications:      . amLODipine  10 mg Oral Daily  . aspirin EC  325 mg Oral Daily  . aspirin  325 mg Oral STAT  . atorvastatin  20 mg Oral q1800  . calcitRIOL  0.5 mcg Oral BID  . calcium carbonate  4 tablet Oral BID  . clopidogrel  75 mg Oral Q breakfast  . docusate sodium  100 mg Oral BID  . estradiol  1 Applicatorful Vaginal 2 times weekly  . gi cocktail  30 mL Oral Once  . levothyroxine  125 mcg Oral QAC breakfast  . nitroGLYCERIN  1 inch Topical Once    . senna  1 tablet Oral BID  . sodium chloride  3 mL Intravenous Q12H  . sodium chloride  3 mL Intravenous Q12H  . vitamin D (CHOLECALCIFEROL)  400 Units Oral BID  . DISCONTD: Estradiol  1 tablet Vaginal 2 times weekly  . DISCONTD:  morphine injection  2 mg Intravenous Once    I  have reviewed scheduled and prn medications.     Problem/Plan: Principal Problem:  *CHEST PAIN UNSPECIFIED Active Problems:  Unspecified hypothyroidism  Essential hypertension, benign  CAD, NATIVE VESSEL  Hypocalcemia  1. Atypical chest pain: Suspicious for musculoskeletal etiology from recent weightlifting. Some features however are not in keeping with musculoskeletal chest pain pattern. However given her history of coronary artery disease and PCI, to rule out cardiac etiology. Has ruled out for MI. Cardiology consulted for further evaluation and management. Continue aspirin and Plavix. Discontinue nitroglycerin ointment. 2. Cough with eating or drinking, ongoing since thyroid surgery: Rule out dysphagia. Speech therapy consultation for swallow evaluation. 3. Hypokalemia: Replete. 4. Mild anemia: Outpatient followup and evaluation and management as deemed necessary. 5. Hypertension: Controlled. Continue amlodipine. 6. Hyperlipidemia: Continue statins. 7. Hypothyroidism status post thyroidectomy: Continue level thyroxine. 8. Primary hypothyroidism: Continue calcitriol and calcium supplements. 9. Retrocardiac opacity, on chest x-ray, without any symptoms or signs of clinical pneumonia. Unclear etiology. Recommend outpatient followup with imaging in a couple of weeks to ensure resolution or need for further evaluation.  Disposition: If no inpatient cardiac workup planned, then possible discharge home later today.  [name] 05/23/2012,8:14 AM  LOS: 1 day

## 2012-05-23 NOTE — Evaluation (Signed)
Clinical/Bedside Swallow Evaluation Patient Details  Name: [name] MRN: [id number] Date of Birth: 07-07-1946  Today's Date: 05/23/2012 Time: 7829-5621 SLP Time Calculation (min): 20 min  Past Medical History:  Past Medical History  Diagnosis Date  . GERD (gastroesophageal reflux disease)     occasional  . Hyperlipidemia   . Hypertension     Echocardiogram 11/19/11: Difficult acoustic windows, EF 60-65%, normal LV wall thickness, grade 1 diastolic dysfunction  . Vertigo   . Tubular adenoma of colon 06/2011  . Colitis   . Multinodular thyroid     Goiter s/p thyroidectomy in 2007 with post-op  hypocalcemia and post-op hypothyroidism/hypoparathyroidism with hypocalcemia  . CAD (coronary artery disease)     a) NSTEMI 10/2011: LHC 11/19/11: pLAD 30%, oOM 60%, AVCFX 30%, CFX after OM2 30%, pRCA 60 and 70%, then 99%, AM 80-90% with TIMI 3 flow.  PCI: Promus DES x 2 to RCA.  Marland Kitchen Post-surgical hypothyroidism   . Hypoparathyroidism   . Palpitations     a) 03/2012 - patient set up for event monitor but did not wear correctly -  she declined wearing a repeat monitor   Past Surgical History:  Past Surgical History  Procedure Date  . Colonoscopy Aenomatous polyps    07/05/2011  . Total thyroidectomy 2007    GOITER  . Shoulder arthroscopy w/ rotator cuff repair     right   HPI:  66 yr old admitted with chest pain, rule out MI.  History of thyroid surgery.  Had MBS 7/09 revealing vallecular residue without penetration or aspiration with coughing from pt.   SLP noted pt. appeared to have a possible vocal cord disorder.  Pt. reports she saw ENT and per pt's description noted a paralyzed vocal cord, recommend surgery (silicone) which pt. did not follow through with.  She presents today with possible left lower pna and continues to have dysphagia symptoms of something stuck in her throat.  MD started her on reflux meds which she feels are helpful.  She reports an episode of food sticking in  throat and actually coming out oral cavity (one episode).  SLP cannot find diagnosis of esophageal dysmotility, reflux, etc..  These symptoms may be esophageal related, however, given history and recent pna, would recommend starting with an MBS.   Assessment / Plan / Recommendation Clinical Impression  Pt. with history of dysphagia following thyroid surgery.  MBS 7/09 revealed vallecular residue without penetration or aspiration however, pt. coughing during MBS.  Vocal cord dysfunction was suspected and SLP recommended ENT consult.  Pt. reported seeing ENT who diagnosed her with paralyzed vocal cord with recommended surgery which she did not follow up on.  She denies significant difficulty with esophagus however reports 1-2 episodes of food coming from esophagus and out of mouth.  MD placed her on reflux meds.  Pt.'s symptoms may only be esophageal related but with history of paralyzed vocal cord, current pna, and persistent symptoms per pt., recommend an MBS.    Aspiration Risk  Mild    Diet Recommendation Regular;Thin liquid   Liquid Administration via: Straw;Cup Medication Administration: Whole meds with liquid Supervision: Patient able to self feed Compensations: Slow rate;Small sips/bites Postural Changes and/or Swallow Maneuvers: Seated upright 90 degrees;Upright 30-60 min after meal    Other  Recommendations Recommended Consults: MBS Oral Care Recommendations: Oral care BID   Follow Up Recommendations  None                 Swallow Study Prior  Functional Status       General HPI: 66 yr old admitted with chest pain, rule out MI.  History of thyroid surgery.  Had MBS 7/09 revealing vallecular residue without penetration or aspiration with coughing from pt.   SLP noted pt. appeared to have a possible vocal cord disorder.  Pt. reports she saw ENT and per pt's description noted a paralyzed vocal cord, recommend surgery (silicone) which pt. did not follow through with.  She presents  today with possible left lower pna and continues to have dysphagia symptoms of something stuck in her throat.  MD started her on reflux meds which she feels are helpful.  She reports an episode of food sticking in throat and actually coming out oral cavity (one episode).  SLP cannot find diagnosis of esophageal dysmotility, reflux, etc..  These symptoms may be esophageal related, however, given history and recent pna, would recommend starting with an MBS. Type of Study: Bedside swallow evaluation Diet Prior to this Study: Regular;Thin liquids Temperature Spikes Noted: No Respiratory Status: Room air History of Recent Intubation: No Behavior/Cognition: Alert;Cooperative;Pleasant mood Oral Cavity - Dentition: Adequate natural dentition Self-Feeding Abilities: Able to feed self Patient Positioning: Upright in bed Baseline Vocal Quality: Hoarse (gravely, has paralyzed vocal cord) Volitional Cough: Weak Volitional Swallow: Able to elicit    Oral/Motor/Sensory Function Overall Oral Motor/Sensory Function: Appears within functional limits for tasks assessed   Ice Chips Ice chips: Not tested   Thin Liquid Thin Liquid: Impaired Presentation: Cup Pharyngeal  Phase Impairments: Cough - Delayed    Nectar Thick Nectar Thick Liquid: Not tested   Honey Thick Honey Thick Liquid: Not tested   Puree Puree: Not tested   Solid Solid: Within functional limits    Breck Coons [employer] [phone]  05/23/2012

## 2012-05-23 NOTE — Consult Note (Signed)
CARDIOLOGY CONSULT NOTE  Patient ID: [name], MRN: [id number], DOB/AGE: [birth year] 65 y.o. Admit date: 05/22/2012   Date of Consult: 05/23/2012 Primary Physician: [name], MD, MD Primary Cardiologist: Dr. [name]  Chief Complaint: chest pain  HPI: 66 y/o F with hx of CAD s/p NSTEMI 10/2011 s/p DESx2 to RCA, normal EF, thyroidectomy presented to [facility] with complaints of chest pain. She exercised with heavier weights on Monday 05/19/12 and felt a twinge, like a pull in her left upper chest so stopped and the pain resolved. She did not have any SOB with that episode. On Tuesday she developed a different sensation, like a chest heaviness/pain in her left chest radiating upwards to her shoulder. It was intermittent, lasting a minute at a time, resolving spontaneously and recurring every few hours. There were no aggravating factors and exertion did not make it worse. NTG, ASA, and Tylenol made no significant difference. Yesterday the pain became more constant, not letting up for several hours. It spontaneously resolved sometime last evening. She occasionally feels a heaviness sensation. There are no associated symptoms including SOB, nausea, diaphoresis, palpitations or syncope. This pain is different than what she had in November with her NSTEMI.   Troponins have been negative x 4 since 8:15pm last night. CXR suggested possible early LLL PNA, but in talking with Dr. Waymon Amato, the medicine team does not feel she has any other clinical signs/sx of pneumonia. Of note, her Hgb is 11.9 today, was 13.2 but BUN/Cr were also more elevated last night. She denies any melena, hematemesis, hematuria or any other source of bleeding. No orthopnea, PND, LEE.  Past Medical History  Diagnosis Date  . GERD (gastroesophageal reflux disease)     occasional  . Hyperlipidemia   . Hypertension     Echocardiogram 11/19/11: Difficult acoustic windows, EF 60-65%, normal LV wall thickness, grade 1  diastolic dysfunction  . Vertigo   . Tubular adenoma of colon 06/2011  . Colitis   . Multinodular thyroid     Goiter s/p thyroidectomy in 2007 with post-op  hypocalcemia and post-op hypothyroidism/hypoparathyroidism with hypocalcemia  . CAD (coronary artery disease)     a) NSTEMI 10/2011: LHC 11/19/11: pLAD 30%, oOM 60%, AVCFX 30%, CFX after OM2 30%, pRCA 60 and 70%, then 99%, AM 80-90% with TIMI 3 flow.  PCI: Promus DES x 2 to RCA.  Marland Kitchen Post-surgical hypothyroidism   . Hypoparathyroidism   . Palpitations     a) 03/2012 - patient set up for event monitor but did not wear correctly -  she declined wearing a repeat monitor      Most Recent Cardiac Studies: 2D Echo Study Conclusions 10/2011 Left ventricle: DIfficult acoustic windows limit study. Overall LVEF is normal at 60 to 65%. There are no definite wall motion abnormalities. The cavity size was normal. Wall thickness was normal. Doppler parameters are consistent with abnormal left ventricular relaxation (grade 1 diastolic dysfunction).   Cardiac Cath 10/2011 Coronary angiography:  Coronary dominance: right  Left mainstem: no significant obstruction.  Left anterior descending (LAD): moderate calcification with 30% segmental plaque proximally, and diffuse luminal irregularities without high grade obstruction  Left circumflex (LCx): Large vessel. 60% ostial OM-1, with 30% of the AV circ beyond OM1. There is an additional 30% beyond OM2. The distal vessel bifurcates in to a bifurcating OM3.  Right coronary artery (RCA): Smaller in caliber with 60 and 70% tandem lesions in proximal portion of mid vessel. The there was 99% stenosis just before  the AV portion of the RCA and the moderate size AM branch. Overlapping Promus Element stents placed across the AM branch with reduction to 0% following post dilatation. The distal RCA was a small PDA and PLA branch. The AM branch, with its exit from the stent has an 80-90% narrowing, but with Timi 3 flow.    Left ventriculography:Not performed.  PCI Data:  Vessel - RCA/Segment - 2  Percent Stenosis (pre) 99  TIMI-flow 2  Stent Promus Element times two (total length 36mm)  Percent Stenosis (post) 0  TIMI-flow (post) 3  Final Conclusions: Non STEMI with stuttering chest pain, and subtotal RCA treated with overlapping DES stents  Recommendations: ASA/Plavix/BB/statin/cardiac rehab.    Surgical History:  Past Surgical History  Procedure Date  . Colonoscopy Aenomatous polyps    07/05/2011  . Total thyroidectomy 2007    GOITER  . Shoulder arthroscopy w/ rotator cuff repair     right     Home Meds: Prior to Admission medications   Medication Sig Start Date End Date Taking? Authorizing Provider  amLODipine (NORVASC) 10 MG tablet Take 1 tablet (10 mg total) by mouth daily. 03/14/12 03/14/13 Yes Pricilla Riffle, MD  aspirin EC 81 MG tablet Take 81 mg by mouth daily.   11/22/11  Yes Dayna N Dunn, PA  calcitRIOL (ROCALTROL) 0.25 MCG capsule Take 2 capsules (0.5 mcg total) by mouth 2 (two) times daily. 12/07/11  Yes Beatrice Lecher, PA  calcium carbonate (TUMS - DOSED IN MG ELEMENTAL CALCIUM) 500 MG chewable tablet Chew 4 tablets by mouth 2 (two) times daily.    Yes Historical Provider, MD  clopidogrel (PLAVIX) 75 MG tablet Take 1 tablet (75 mg total) by mouth daily with breakfast. 03/14/12 03/14/13 Yes Pricilla Riffle, MD  Estradiol 10 MCG TABS Place 1 tablet (10 mcg total) vaginally 2 (two) times a week. 05/14/12  Yes [name], MD  famotidine (PEPCID) 20 MG tablet Take 1 tablet (20 mg total) by mouth daily. 03/14/12  Yes Pricilla Riffle, MD  levothyroxine (SYNTHROID, LEVOTHROID) 125 MCG tablet Take 125 mcg by mouth daily.     Yes Historical Provider, MD  nitroGLYCERIN (NITROSTAT) 0.4 MG SL tablet Place 1 tablet (0.4 mg total) under the tongue every 5 (five) minutes as needed for chest pain (Up to 3 doses.). 11/22/11 11/21/12 Yes Dayna N Dunn, PA  rosuvastatin (CRESTOR) 10 MG tablet Take 1 tablet (10 mg  total) by mouth daily. 03/14/12 03/14/13 Yes Pricilla Riffle, MD  vitamin D, CHOLECALCIFEROL, 400 UNITS tablet Take 1 tablet (400 Units total) by mouth 2 (two) times daily. 11/22/11  Yes Laurann Montana, PA    Inpatient Medications:     . amLODipine  10 mg Oral Daily  . aspirin EC  325 mg Oral Daily  . aspirin  325 mg Oral STAT  . atorvastatin  20 mg Oral q1800  . calcitRIOL  0.5 mcg Oral BID  . calcium carbonate  4 tablet Oral BID  . clopidogrel  75 mg Oral Q breakfast  . docusate sodium  100 mg Oral BID  . estradiol  1 Applicatorful Vaginal 2 times weekly  . gi cocktail  30 mL Oral Once  . levothyroxine  125 mcg Oral QAC breakfast  . nitroGLYCERIN  1 inch Topical Once  . potassium chloride  40 mEq Oral Once  . senna  1 tablet Oral BID  . sodium chloride  3 mL Intravenous Q12H  . sodium chloride  3  mL Intravenous Q12H  . vitamin D (CHOLECALCIFEROL)  400 Units Oral BID  . DISCONTD: Estradiol  1 tablet Vaginal 2 times weekly  . DISCONTD:  morphine injection  2 mg Intravenous Once    Allergies: No Known Allergies  History   Social History  . Marital Status: Single    Spouse Name: N/A    Number of Children: N/A  . Years of Education: N/A   Occupational History  . Not on file.   Social History Main Topics  . Smoking status: Never Smoker   . Smokeless tobacco: Never Used  . Alcohol Use: No  . Drug Use: No  . Sexually Active: Yes   Other Topics Concern  . Not on file   Social History Narrative  . No narrative on file     Family History  Problem Relation Age of Onset  . Colon cancer Brother   . Cancer Brother     COLON  . Hypertension Father      Review of Systems: General: negative for chills, fever, night sweats or weight changes.  Cardiovascular: see above Dermatological: negative for rash Respiratory: negative for cough or wheezing Urologic: negative for hematuria Abdominal: negative for nausea, vomiting, diarrhea, bright red blood per rectum, melena, or  hematemesis Neurologic: negative for visual changes, syncope, or dizziness All other systems reviewed and are otherwise negative except as noted above.  Labs:  [facility] 05/23/12 0530 05/22/12 2201 05/22/12 1859  CKTOTAL 91 -- --  CKMB 1.7 -- --  TROPONINI <0.30 <0.30 <0.30   Lab Results  Component Value Date   WBC 6.0 05/23/2012   HGB 11.7* 05/23/2012   HCT 35.6* 05/23/2012   MCV 83.2 05/23/2012   PLT 183 05/23/2012     Lab 05/23/12 0530  NA 141  K 3.4*  CL 104  CO2 26  BUN 24*  CREATININE 1.04  CALCIUM 7.9*  PROT --  BILITOT --  ALKPHOS --  ALT --  AST --  GLUCOSE 89   Lab Results  Component Value Date   CHOL 160 04/07/2012   HDL 44.50 04/07/2012   LDLCALC 92 04/07/2012   TRIG 118.0 04/07/2012   Radiology/Studies:  1. Chest 2 View 05/22/2012  *RADIOLOGY REPORT*  Clinical Data: Chest pain for 2 days.  Hypertension.  CHEST - 2 VIEW  Comparison: 12/08/2011  Findings: Low lung volumes.  Retrocardiac opacity not present previously suspicious for early left lower lobe pneumonia.  Minimal right basilar atelectasis. Heart size within normal limits for degree of inspiration.  Mild degenerative change thoracic spine. Osteopenia.  IMPRESSION: Suspect early left lower lobe pneumonia.  Original Report Authenticated By: Elsie Stain, M.D.   EKG:  1. 5/30: NSR 66bpm TW flattening in V2 2. 5/31: NSR 60bpm TW flattening V2. TWI is noted in III, but lead placement appears changed from original EKG given vectors. Neither EKG is significantly changed from 11/2011.  Physical Exam: Blood pressure 97/38, pulse 61, temperature 97.8 F (36.6 C), temperature source Oral, resp. rate 16, height 5\' 4"  (1.626 m), weight 191 lb 4.8 oz (86.773 kg), SpO2 98.00%. General: Well developed, well nourished F in no acute distress. Head: Normocephalic, atraumatic, sclera non-icteric, no xanthomas, nares are without discharge.  Neck: L carotid bruit noted. No bruit on R. JVD not elevated. Lungs: Generally  decreased BS (mod inspiratory effort) but clear bilaterally to auscultation without wheezes, rales, or rhonchi. Breathing is unlabored. Heart: RRR with S1 S2. No murmurs, rubs, or gallops appreciated. Abdomen: Soft, non-tender, non-distended  with normoactive bowel sounds. No hepatomegaly. No rebound/guarding. No obvious abdominal masses. Msk:  Strength and tone appear normal for age. Extremities: No clubbing or cyanosis. No edema.  Distal pedal pulses are 2+ and equal bilaterally. Neuro: Alert and oriented X 3. Moves all extremities spontaneously. Psych:  Responds to questions appropriately with a normal affect.   Assessment and Plan:   1. Chest pain with mostly atypical features and no objective evidence of ischemia despite constant sx. Will discuss with MD - see below for our comprehensive thoughts. 2. CAD s/p NSTEMI 10/2011 - continue ASA but would drop dose to 81mg  given concomitant Plavix. Continue statin. No BB due to baseline bradycardia.  3. Borderline low blood pressures - likely related to NTG; agree with discontinuation per Dr. Waymon Amato. Otherwise continue home meds. 4. L carotid bruit - incidental pickup on exam. No TIA sx. Recommend OP carotid dopplers which our office will arrange. 5. Hypokalemia - primary team is repleting. 6. Mild anemia - unclear if she was dehydrated when she first came in with mildly elevated BUN/Cr, but Hgb 13.2->11.9. If she is D/C'd, would recommend OP PCP f/u to rule out progressive decline.  Signed, Ronie Spies PA-C 05/23/2012, 9:20 AM   Patient seen and examined. I agree with the assessment and plan as detailed above. See also my additional thoughts below.   I discussed the patient's symptoms with her. Her son is in the room at that time. At this point her symptoms do not appear to be compatible with her ischemic disease. It is possible there could be some musculoskeletal components. It is possible that reflux might be playing a role. At this point I  recommend no further inpatient cardiac workup. I would suggest outpatient followup with Dr. [name]. I would suggest that a carotid Doppler be arranged at our office for a very soft left carotid bruit.  [name], MD, [facility] 05/23/2012 10:09 AM

## 2012-05-23 NOTE — Discharge Summary (Signed)
Discharge Summary  [name] MR#: [id number]  DOB:1946/11/19  Date of Admission: 05/22/2012 Date of Discharge: 05/23/2012  Patient's PCP: [name], MD, MD  Attending Physician:[name]  Consults: Cardiology: Dr. [name]  Discharge Diagnoses: Principal Problem:  *CHEST PAIN UNSPECIFIED Active Problems:  Unspecified hypothyroidism  Essential hypertension, benign  CAD, NATIVE VESSEL  Hypocalcemia  hypokalemia  Brief Admitting History and Physical 66 year old female with history of coronary artery disease status post non-STEMI 10/2011 status post DES x 2 to RCA, normal EF, thyroidectomy, hypertension, hyperlipidemia, hypoparathyroidism who presented to the emergency department on 5/30 with atypical chest pain. She was admitted for further evaluation and management.  Discharge Medications Current Discharge Medication List    START taking these medications   Details  UNABLE TO FIND Ms. [name] was admitted to the [facility] from 05/22/12-05/23/12 for medical evaluation and treatment. She is being discharged from the hospital on 05/23/12 and may return to work without any new restrictions. Please contact us for any further assistance. Thank you. [facility], Phone # [phone]. Qty: 1 Units, Refills: 0      CONTINUE these medications which have NOT CHANGED   Details  amLODipine (NORVASC) 10 MG tablet Take 1 tablet (10 mg total) by mouth daily. Qty: 90 tablet, Refills: 3   Comments: DO NOT SEND ORDER TILL SHE CALLS FOR REORDER.    aspirin EC 81 MG tablet Take 81 mg by mouth daily.      calcitRIOL (ROCALTROL) 0.25 MCG capsule Take 2 capsules (0.5 mcg total) by mouth 2 (two) times daily. Qty: 120 capsule, Refills: 3    calcium carbonate (TUMS - DOSED IN MG ELEMENTAL CALCIUM) 500 MG chewable tablet Chew 4 tablets by mouth 2 (two) times daily.     clopidogrel (PLAVIX) 75 MG tablet Take 1 tablet (75 mg total) by mouth daily with  breakfast. Qty: 90 tablet, Refills: 3    Estradiol 10 MCG TABS Place 1 tablet (10 mcg total) vaginally 2 (two) times a week. Qty: 8 tablet, Refills: 11    famotidine (PEPCID) 20 MG tablet Take 1 tablet (20 mg total) by mouth daily. Qty: 90 tablet, Refills: 3    levothyroxine (SYNTHROID, LEVOTHROID) 125 MCG tablet Take 125 mcg by mouth daily.      nitroGLYCERIN (NITROSTAT) 0.4 MG SL tablet Place 1 tablet (0.4 mg total) under the tongue every 5 (five) minutes as needed for chest pain (Up to 3 doses.). Qty: 25 tablet, Refills: 4    rosuvastatin (CRESTOR) 10 MG tablet Take 1 tablet (10 mg total) by mouth daily. Qty: 90 tablet, Refills: 3    vitamin D, CHOLECALCIFEROL, 400 UNITS tablet Take 1 tablet (400 Units total) by mouth 2 (two) times daily. Qty: 60 tablet, Refills: 1        Hospital Course: Chest pain, unspecified Present on Admission:  .CAD, NATIVE VESSEL .CHEST PAIN UNSPECIFIED .Essential hypertension, benign .Unspecified hypothyroidism .Hypocalcemia   1. Atypical chest pain: Suspicious for musculoskeletal etiology from recent weightlifting. Cardiac enzymes were cycled and negative. Cardiology was consulted. They opined that her chest pain did not appear to be compatible with ischemic disease and that it could be musculoskeletal or reflux related. They did not recommend any further inpatient cardiac workup. They have arranged for outpatient followup with Dr. [name]. 2. Cough with eating or drinking, ongoing since thyroid surgery: Speech therapist consulted and performed modified barium swallow. Discussed with speech therapy. Patient is recommended regular diet with thin liquids and the speech therapist  has gone over the patient with detailed instructions. Patient however may benefit from further assessment of esophageal structure and function for possible stricture. Patient was made aware of this and she can followup with her primary care physician. She verbalized  understanding. 3. Hypokalemia: Repleted.  4. Mild anemia: Outpatient followup and evaluation and management as deemed necessary. No bleeding. 5. Hypertension: Controlled. Continue amlodipine.  6. Hyperlipidemia: Continue statins.  7. Hypothyroidism status post thyroidectomy: Continue levothyroxine.  8. Primary hypoparathyroidism: Continue calcitriol and calcium supplements.  9. Retrocardiac opacity, on chest x-ray, without any symptoms or signs of clinical pneumonia. Unclear etiology. Recommend outpatient followup with imaging in a couple of weeks to ensure resolution or need for further evaluation. This was also brought to the patient's attention and advised followup when she sees her primary care physician. She verbalizes understanding.   Day of Discharge  Complaints: No further chest pain.  Physical exam: BP 104/58  Pulse 64  Temp(Src) 97.6 F (36.4 C) (Oral)  Resp 18  Ht 5\' 4"  (1.626 m)  Wt 86.773 kg (191 lb 4.8 oz)  BMI 32.84 kg/m2  SpO2 96% General Exam: Comfortable.  Respiratory System: Clear. No increased work of breathing. ? Reproducible chest pain left lower parasternal region.  Cardiovascular System: First and second heart sounds heard. Regular rate and rhythm. No JVD/murmurs. Telemetry shows sinus rhythm in the 60s to 70s without arrhythmia alarms.  Gastrointestinal System: Abdomen is non distended, soft and normal bowel sounds heard. Nontender.  Central Nervous System: Alert and oriented. No focal neurological deficits.  Extremities: Symmetric 5 x 5 power. No pedal edema.  Basic Metabolic Panel:  Lab 05/23/12 1610 05/22/12 1859  NA 141 143  K 3.4* 4.2  CL 104 103  CO2 26 29  GLUCOSE 89 91  BUN 24* 30*  CREATININE 1.04 1.15*  CALCIUM 7.9* 9.1  ALB -- --  PHOS -- --   Liver Function Tests: No results found for this basename: AST:3,ALT:3,ALKPHOS:3,BILITOT:3,PROT:3,ALBUMIN:3 in the last 168 hours No results found for this basename: LIPASE:3,AMYLASE:3 in the  last 168 hours No results found for this basename: AMMONIA:3 in the last 168 hours CBC:  Lab 05/23/12 0530 05/22/12 1859  WBC 6.0 8.9  NEUTROABS -- --  HGB 11.7* 13.2  HCT 35.6* 39.2  MCV 83.2 83.1  PLT 183 219   Cardiac Enzymes:  Lab 05/23/12 1346 05/23/12 0530 05/22/12 2201 05/22/12 1859  CKTOTAL 81 91 -- --  CKMB 1.3 1.7 -- --  CKMBINDEX -- -- -- --  TROPONINI <0.30 <0.30 <0.30 <0.30   CBG: No results found for this basename: GLUCAP:5 in the last 168 hours   Dg Chest 2 View  05/22/2012  *RADIOLOGY REPORT*  Clinical Data: Chest pain for 2 days.  Hypertension.  CHEST - 2 VIEW  Comparison: 12/08/2011  Findings: Low lung volumes.  Retrocardiac opacity not present previously suspicious for early left lower lobe pneumonia.  Minimal right basilar atelectasis. Heart size within normal limits for degree of inspiration.  Mild degenerative change thoracic spine. Osteopenia.  IMPRESSION: Suspect early left lower lobe pneumonia.  Original Report Authenticated By: Elsie Stain, M.D.     Disposition: Discharged home in stable condition.  Diet: Heart healthy diet.  Activity:  Increase activity gradually.   Follow-up Appts: Discharge Orders    Future Appointments: Provider: Department: Dept Phone: Center:   06/02/2012 3:30 PM [facility] Pv 3 [facility]  None   07/07/2012 11:15 AM Pricilla Riffle, MD [facility] [phone] [facility]  Future Orders Please Complete By Expires   Diet - low sodium heart healthy      Increase activity slowly      Call MD for:  severe uncontrolled pain         TESTS THAT NEED FOLLOW-UP Followup chest x-ray in a couple of weeks to ensure resolution of retrocardiac opacity seen on chest x-ray.  Time spent on discharge, talking to the patient, and coordinating care: 25 mins.   SignedMarcellus Scott, MD 05/23/2012, 3:53 PM

## 2012-05-23 NOTE — Progress Notes (Signed)
Utilization Review Completed.[name] T5/31/2013

## 2012-05-23 NOTE — Progress Notes (Addendum)
Clinical Social Work Department BRIEF PSYCHOSOCIAL ASSESSMENT 05/23/2012  Patient:  [name], [name]     Account Number:  [id number]     Admit date:  05/22/2012  Clinical Social Worker:  Doree Albee  Date/Time:  05/23/2012 10:00 AM  Referred by:  RN  Date Referred:  05/23/2012 Referred for Other - See comment  Other Referral:   Primary Care provider  Interview type:  Patient Other interview type:    PSYCHOSOCIAL DATA Living Status:  ALONE Admitted from facility:   Level of care:   Primary support name:  [name] Primary support relationship to patient:  FAMILY Degree of support available:   adequate   CURRENT CONCERNS Current Concerns Other - See comment  Other Concerns:   primary care provider for follow up care   SOCIAL WORK ASSESSMENT / PLAN CSW met with pt at bedside to discuss pt current need for primary care provider. Pt shared that pt has insurance thorugh [facility] but as been unable to find a primary care provider. CSW and pt discussed using [facility] to locate a primary care provider in the area that accepts patient health insurnace.  Pt stated she was interested in finding a pcp in order ot have good follow up care.  Assessment/plan status:  No Further Intervention Required Other assessment/ plan:   Information/referral to community resources:   Engineer, manufacturing systems   PATIENT'S/FAMILY'S RESPONSE TO PLAN OF CARE: pt appreciated csw for cocnern and support and resource to help locate a pcp. No further csw needs, signing off.   Catha Gosselin, Theresia Majors  [phone] .05/23/2012 9:58am

## 2012-05-23 NOTE — H&P (Signed)
PCP:  [name], MD, MD  Dietrich Pates, cardiology  Chief Complaint:  Chest pain  HPI: [phone] with h/o NSTEMI s/p DES x2 to RCA in 10/2011, HTN/HL, primary hypoparathyroidism  with hypocalcemia, MNG s/p thyroidectomy and hypothyroidism presents with chest pain.   Pt is not great historian, she was awoken and is a bit terse, but states she has not  had chest pain since the stenting until Wednesday when she developed left sternal  tightness, unrelated to exertion and not relieved by rest, no associated with any  particular activity, lasting a few minutes at a time, coming and going with no  particular pattern she identifies. No associated symptoms at all, no radiation. She  took some aspirin and SL NTG once only.   In the ED, vitals were stable. Labs with renal 30/1.15, Trop negative x2 but only 3  hours apart, normal CBC. CXR with ? early LLL PNA, however given lack of cough, fever,  WBC count, this was felt not clinically relevant so not treated. By my history, she  says she has a little cough coming from her throat, but no fevers, chills, malaise.   ED called [facility] cardiology who said admit medicine for rule out.   ROS as above, otherwise stable. She gets some leg swelling bc she works on her feet,  but states they're not swollen now. Negative otherwise.     Past Medical History  Diagnosis Date  . GERD (gastroesophageal reflux disease)     occasional  . Hyperlipidemia   . Hypertension     Echocardiogram 11/19/11: Difficult acoustic windows, EF 60-65%, normal LV wall thickness, grade 1 diastolic dysfunction  . Vertigo   . Tubular adenoma of colon 06/2011  . Colitis   . Multinodular thyroid     Goiter s/p thyroidectomy in 2007 with post-op  hypocalcemia and post-op hypothyroidism (?hypoparathyroidism)  . CAD (coronary artery disease)     NSTEMI 12/12: LHC 11/19/11: pLAD 30%, oOM 60%, AVCFX 30%, CFX after OM2 30%, pRCA 60 and 70%, then 99%, AM 80-90% with TIMI 3 flow.   PCI: Promus DES x 2 to RCA.  Marland Kitchen Post-surgical hypothyroidism   . Hypoparathyroidism   . Myocardial infarction     Past Surgical History  Procedure Date  . Colonoscopy Aenomatous polyps    07/05/2011  . Total thyroidectomy 2007    GOITER  . Shoulder arthroscopy w/ rotator cuff repair     right    Medications:  HOME MEDS: Prior to Admission medications   Medication Sig Start Date End Date Taking? Authorizing Provider  amLODipine (NORVASC) 10 MG tablet Take 1 tablet (10 mg total) by mouth daily. 03/14/12 03/14/13 Yes Pricilla Riffle, MD  aspirin EC 81 MG tablet Take 81 mg by mouth daily.   11/22/11  Yes Dayna N Dunn, PA  calcitRIOL (ROCALTROL) 0.25 MCG capsule Take 2 capsules (0.5 mcg total) by mouth 2 (two) times daily. 12/07/11  Yes Beatrice Lecher, PA  calcium carbonate (TUMS - DOSED IN MG ELEMENTAL CALCIUM) 500 MG chewable tablet Chew 4 tablets by mouth 2 (two) times daily.    Yes Historical Provider, MD  clopidogrel (PLAVIX) 75 MG tablet Take 1 tablet (75 mg total) by mouth daily with breakfast. 03/14/12 03/14/13 Yes Pricilla Riffle, MD  Estradiol 10 MCG TABS Place 1 tablet (10 mcg total) vaginally 2 (two) times a week. 05/14/12  Yes [name], MD  famotidine (PEPCID) 20 MG tablet Take 1 tablet (20 mg total) by mouth  daily. 03/14/12  Yes Pricilla Riffle, MD  levothyroxine (SYNTHROID, LEVOTHROID) 125 MCG tablet Take 125 mcg by mouth daily.     Yes Historical Provider, MD  nitroGLYCERIN (NITROSTAT) 0.4 MG SL tablet Place 1 tablet (0.4 mg total) under the tongue every 5 (five) minutes as needed for chest pain (Up to 3 doses.). 11/22/11 11/21/12 Yes Dayna N Dunn, PA  rosuvastatin (CRESTOR) 10 MG tablet Take 1 tablet (10 mg total) by mouth daily. 03/14/12 03/14/13 Yes Pricilla Riffle, MD  vitamin D, CHOLECALCIFEROL, 400 UNITS tablet Take 1 tablet (400 Units total) by mouth 2 (two) times daily. 11/22/11  Yes Dayna N Dunn, PA    Allergies:  No Known Allergies  Social History:   reports that she has  never smoked. She has never used smokeless tobacco. She reports that she does not drink alcohol or use illicit drugs. She works in [employer] and is still active. Never smoker.    Family History: Family History  Problem Relation Age of Onset  . Colon cancer Brother   . Cancer Brother     COLON  . Hypertension Father     Physical Exam: Filed Vitals:   05/22/12 2022 05/22/12 2124 05/22/12 2230 05/23/12 0104  BP: 122/62 107/55 116/54 112/56  Pulse:  59  64  Temp:      TempSrc:      Resp: 13 18 12 19   SpO2: 99% 97% 100% 95%   Blood pressure 112/56, pulse 64, temperature 98 F (36.7 C), temperature source Oral, resp. rate 19, SpO2 95.00%.  Gen: Middle aged F in no distress, sleeping but awoken easily, not very talkative, able  to give history on her own, appears totally stable.  HEENT: Pupils round, raective, EOMI, sclera clear and normal appearing, mouth moist and  normal Lungs: CTAB no w/c/r, good air movement, normal exam, occasional cough Heart: S1/2 clear and normal, regular, not tachy, no m/g Abd: Soft, not tender, not distended, normal exam. No grimacing  Extrem: warm, perfusing well, no BLE edema, normal bulk and tone, normal exam Neuro: Alert, attentive, covnersant, CN 2-12 intact, moves extremities on her own.  Grossly non-focal.    Labs & Imaging Results for orders placed during the hospital encounter of 05/22/12 (from the past 48 hour(s))  CBC     Status: Normal   Collection Time   05/22/12  6:59 PM      Component Value Range Comment   WBC 8.9  4.0 - 10.5 (K/uL)    RBC 4.72  3.87 - 5.11 (MIL/uL)    Hemoglobin 13.2  12.0 - 15.0 (g/dL)    HCT 29.5  62.1 - 30.8 (%)    MCV 83.1  78.0 - 100.0 (fL)    MCH 28.0  26.0 - 34.0 (pg)    MCHC 33.7  30.0 - 36.0 (g/dL)    RDW 65.7  84.6 - 96.2 (%)    Platelets 219  150 - 400 (K/uL)   BASIC METABOLIC PANEL     Status: Abnormal   Collection Time   05/22/12  6:59 PM      Component Value Range Comment   Sodium 143  135 - 145  (mEq/L)    Potassium 4.2  3.5 - 5.1 (mEq/L)    Chloride 103  96 - 112 (mEq/L)    CO2 29  19 - 32 (mEq/L)    Glucose, Bld 91  70 - 99 (mg/dL)    BUN 30 (*) 6 - 23 (mg/dL)  Creatinine, Ser 1.15 (*) 0.50 - 1.10 (mg/dL)    Calcium 9.1  8.4 - 10.5 (mg/dL)    GFR calc non Af Amer 49 (*) >90 (mL/min)    GFR calc Af Amer 57 (*) >90 (mL/min)   TROPONIN I     Status: Normal   Collection Time   05/22/12  6:59 PM      Component Value Range Comment   Troponin I <0.30  <0.30 (ng/mL)   TROPONIN I     Status: Normal   Collection Time   05/22/12 10:01 PM      Component Value Range Comment   Troponin I <0.30  <0.30 (ng/mL)   POCT I-STAT TROPONIN I     Status: Normal   Collection Time   05/22/12 10:16 PM      Component Value Range Comment   Troponin i, poc 0.00  0.00 - 0.08 (ng/mL)    Comment 3             Dg Chest 2 View  05/22/2012  *RADIOLOGY REPORT*  Clinical Data: Chest pain for 2 days.  Hypertension.  CHEST - 2 VIEW  Comparison: 12/08/2011  Findings: Low lung volumes.  Retrocardiac opacity not present previously suspicious for early left lower lobe pneumonia.  Minimal right basilar atelectasis. Heart size within normal limits for degree of inspiration.  Mild degenerative change thoracic spine. Osteopenia.  IMPRESSION: Suspect early left lower lobe pneumonia.  Original Report Authenticated By: Elsie Stain, M.D.     ECG NSR 66 bpm, normal axis, normal P and PR. Narrow QRS, no Q wave. Small complexes  throughout. No frank ST deviations, normal T waves overall, some isolated flat T waves  Impression Present on Admission:  .CAD, NATIVE VESSEL .CHEST PAIN UNSPECIFIED .Essential hypertension, benign .Unspecified hypothyroidism .Hypocalcemia  66yoF with h/o NSTEMI s/p DES x2 to RCA in 10/2011, HTN/HL, primary hypoparathyroidism  with hypocalcemia, MNG s/p thyroidectomy and hypothyroidism presents with chest pain.   1. Chest pain: High risk pt, just stented 6 mos ago, but compliant with  ASA/plavix per  pt. No current chest pain, and no objective evidence of ischemia at present. Could be  chronic angina, nothing to suggest MI at present.   DDx includes pneumonia given left chest pain and CXR with L possible PNA, and pt has  somewhat of a cough, however no fever, WBC count, so this story is not overwhelming  either.   - Observation for rule out MI, trend enzymes and ECG and if negative and no symptoms  can likely d/c in the am to f/u with Dr. [name] for stress.  - Increase to ASA 325, continue plavix, amlodipine  2. Hypoparathyroidism, hypoCa: continue VitD, rocaltrol  3. Hypothyroid: Continue home synthroid  DVT prophy: early ambulation Telemetry MC team 9 Presumed full code   Other plans as per orders.    Campbell Agramonte 05/23/2012, 1:05 AM

## 2012-05-23 NOTE — Progress Notes (Signed)
Speech Language Pathology Treatment Patient Details Name: MERIEL KELLIHER MRN: [id number] DOB: 30-Aug-1946 Today's Date: 05/23/2012 Time: 3086-5784 SLP Time Calculation (min): 20 min    Bedside swallow assessment completed.  MBS scheduled for today at 1300.  Full bedside report will be documented soon.     Breck Coons [place].Ed [employer] [phone]  05/23/2012

## 2012-05-29 ENCOUNTER — Other Ambulatory Visit: Payer: Self-pay | Admitting: Cardiology

## 2012-05-29 DIAGNOSIS — R0989 Other specified symptoms and signs involving the circulatory and respiratory systems: Secondary | ICD-10-CM

## 2012-06-02 ENCOUNTER — Encounter (INDEPENDENT_AMBULATORY_CARE_PROVIDER_SITE_OTHER): Payer: 59

## 2012-06-02 DIAGNOSIS — R42 Dizziness and giddiness: Secondary | ICD-10-CM

## 2012-06-02 DIAGNOSIS — R0989 Other specified symptoms and signs involving the circulatory and respiratory systems: Secondary | ICD-10-CM

## 2012-06-05 ENCOUNTER — Telehealth: Payer: Self-pay | Admitting: Internal Medicine

## 2012-06-05 ENCOUNTER — Ambulatory Visit (HOSPITAL_BASED_OUTPATIENT_CLINIC_OR_DEPARTMENT_OTHER)
Admission: RE | Admit: 2012-06-05 | Discharge: 2012-06-05 | Disposition: A | Discharge status: Home / Self Care | Payer: 59 | Source: Ambulatory Visit | Attending: Internal Medicine | Admitting: Internal Medicine

## 2012-06-05 ENCOUNTER — Encounter: Payer: Self-pay | Admitting: Internal Medicine

## 2012-06-05 ENCOUNTER — Ambulatory Visit (INDEPENDENT_AMBULATORY_CARE_PROVIDER_SITE_OTHER): Payer: 59 | Admitting: Internal Medicine

## 2012-06-05 VITALS — BP 110/60 | HR 79 | Temp 98.6°F | Resp 20 | Ht 63.5 in | Wt 190.0 lb

## 2012-06-05 DIAGNOSIS — Z23 Encounter for immunization: Secondary | ICD-10-CM

## 2012-06-05 DIAGNOSIS — E039 Hypothyroidism, unspecified: Secondary | ICD-10-CM

## 2012-06-05 DIAGNOSIS — R9389 Abnormal findings on diagnostic imaging of other specified body structures: Secondary | ICD-10-CM

## 2012-06-05 DIAGNOSIS — D649 Anemia, unspecified: Secondary | ICD-10-CM

## 2012-06-05 DIAGNOSIS — R918 Other nonspecific abnormal finding of lung field: Secondary | ICD-10-CM

## 2012-06-05 DIAGNOSIS — E785 Hyperlipidemia, unspecified: Secondary | ICD-10-CM

## 2012-06-05 DIAGNOSIS — E876 Hypokalemia: Secondary | ICD-10-CM

## 2012-06-05 DIAGNOSIS — Z79899 Other long term (current) drug therapy: Secondary | ICD-10-CM

## 2012-06-05 LAB — CBC WITH DIFFERENTIAL/PLATELET
Eosinophils Absolute: 0.3 10*3/uL (ref 0.0–0.7)
Eosinophils Relative: 3 % (ref 0–5)
Hemoglobin: 12.5 g/dL (ref 12.0–15.0)
Lymphs Abs: 2.4 10*3/uL (ref 0.7–4.0)
MCH: 26.9 pg (ref 26.0–34.0)
MCV: 82.8 fL (ref 78.0–100.0)
Monocytes Relative: 6 % (ref 3–12)
Platelets: 246 10*3/uL (ref 150–400)
RBC: 4.64 MIL/uL (ref 3.87–5.11)

## 2012-06-05 NOTE — Patient Instructions (Signed)
Please schedule fasting labs prior to next visit Cbc, chem7-v58.69, lipid/lft-272.4, tsh/free t4-hypothyroidism

## 2012-06-05 NOTE — Telephone Encounter (Signed)
Please schedule fasting labs prior to next visit  Cbc, chem7-v58.69, lipid/lft-272.4, tsh/free t4-hypothyroidism   Patient has upcoming appointment in 12/13. She will be going to [facility] lab.

## 2012-06-05 NOTE — Telephone Encounter (Signed)
Lab orders entered for December 2013. 

## 2012-06-06 LAB — BASIC METABOLIC PANEL
BUN: 22 mg/dL (ref 6–23)
Chloride: 103 mEq/L (ref 96–112)
Potassium: 4.1 mEq/L (ref 3.5–5.3)

## 2012-06-11 DIAGNOSIS — D649 Anemia, unspecified: Secondary | ICD-10-CM | POA: Insufficient documentation

## 2012-06-11 DIAGNOSIS — E876 Hypokalemia: Secondary | ICD-10-CM | POA: Insufficient documentation

## 2012-06-11 DIAGNOSIS — R9389 Abnormal findings on diagnostic imaging of other specified body structures: Secondary | ICD-10-CM | POA: Insufficient documentation

## 2012-06-11 NOTE — Assessment & Plan Note (Signed)
Obtain cbc and iron level 

## 2012-06-11 NOTE — Assessment & Plan Note (Signed)
Obtain chem7 

## 2012-06-11 NOTE — Assessment & Plan Note (Signed)
Assume labs monitoring and medication refills.

## 2012-06-11 NOTE — Assessment & Plan Note (Signed)
Obtain cxr 

## 2012-06-11 NOTE — Progress Notes (Signed)
  Subjective:    Patient ID: [name], female    DOB: [birth year], 66 y.o.   MRN: [id number]  HPI Pt presents to clinic for follow up of multiple medical problems. H/o hypothyroidism previously followed by endocrinology. Recently underwent carotid US and results reviewed with pt-40-59% right and 0-39% left. No neurologic deficits. CXR 5/30 interpreted as possible early LLL infiltrate. No sx of cough, fever or dyspnea. Has mild anemia 11.7 on CBC and mild hypokalemia recently. Believes tetanus ~8y ago.   Past Medical History  Diagnosis Date  . GERD (gastroesophageal reflux disease)     occasional  . Hyperlipidemia   . Hypertension     Echocardiogram 11/19/11: Difficult acoustic windows, EF 60-65%, normal LV wall thickness, grade 1 diastolic dysfunction  . Vertigo   . Tubular adenoma of colon 06/2011  . Colitis   . Multinodular thyroid     Goiter s/p thyroidectomy in 2007 with post-op  hypocalcemia and post-op hypothyroidism/hypoparathyroidism with hypocalcemia  . CAD (coronary artery disease)     a) NSTEMI 10/2011: LHC 11/19/11: pLAD 30%, oOM 60%, AVCFX 30%, CFX after OM2 30%, pRCA 60 and 70%, then 99%, AM 80-90% with TIMI 3 flow.  PCI: Promus DES x 2 to RCA.  Marland Kitchen Post-surgical hypothyroidism   . Hypoparathyroidism   . Palpitations     a) 03/2012 - patient set up for event monitor but did not wear correctly -  she declined wearing a repeat monitor   Past Surgical History  Procedure Date  . Colonoscopy Aenomatous polyps    07/05/2011  . Total thyroidectomy 2007    GOITER  . Shoulder arthroscopy w/ rotator cuff repair     right    reports that she has never smoked. She has never used smokeless tobacco. She reports that she does not drink alcohol or use illicit drugs. family history includes Cancer in her brother; Colon cancer in her brother; Heart disease in her father; and Hypertension in her father.  There is no history of Diabetes, and Prostate cancer, and Breast cancer, . No  Known Allergies   Review of Systems  Neurological: Negative for dizziness, facial asymmetry, speech difficulty, weakness and numbness.  All other systems reviewed and are negative.       Objective:   Physical Exam  Nursing note and vitals reviewed. Constitutional: She appears well-developed and well-nourished. No distress.  HENT:  Head: Normocephalic and atraumatic.  Right Ear: External ear normal.  Left Ear: External ear normal.  Eyes: Conjunctivae are normal. No scleral icterus.  Neck: Neck supple. No thyromegaly present.  Cardiovascular: Normal rate, regular rhythm and normal heart sounds.  Exam reveals no gallop and no friction rub.   No murmur heard. Pulmonary/Chest: Effort normal and breath sounds normal. No respiratory distress. She has no wheezes. She has no rales.  Lymphadenopathy:    She has no cervical adenopathy.  Neurological: She is alert.  Skin: Skin is warm and dry. She is not diaphoretic.  Psychiatric: She has a normal mood and affect.          Assessment & Plan:

## 2012-06-17 ENCOUNTER — Other Ambulatory Visit: Payer: Self-pay | Admitting: Internal Medicine

## 2012-06-18 MED ORDER — LEVOTHYROXINE SODIUM 125 MCG PO TABS: 125.0000 ug | ORAL_TABLET | Freq: Every day | ORAL | Status: DC

## 2012-06-18 MED ORDER — AMLODIPINE BESYLATE 10 MG PO TABS: 10.0000 mg | ORAL_TABLET | Freq: Every day | ORAL | Status: DC

## 2012-06-18 MED ORDER — CALCITRIOL 0.25 MCG PO CAPS: 0.5000 ug | ORAL_CAPSULE | Freq: Two times a day (BID) | ORAL | Status: DC

## 2012-06-18 MED ORDER — CLOPIDOGREL BISULFATE 75 MG PO TABS: 75.0000 mg | ORAL_TABLET | Freq: Every day | ORAL | Status: DC

## 2012-07-07 ENCOUNTER — Encounter: Payer: Self-pay | Admitting: *Deleted

## 2012-07-07 ENCOUNTER — Encounter: Payer: Self-pay | Admitting: Internal Medicine

## 2012-07-07 ENCOUNTER — Ambulatory Visit (INDEPENDENT_AMBULATORY_CARE_PROVIDER_SITE_OTHER): Payer: 59 | Admitting: Internal Medicine

## 2012-07-07 VITALS — BP 139/73 | HR 63 | Ht 63.0 in | Wt 187.1 lb

## 2012-07-07 DIAGNOSIS — R079 Chest pain, unspecified: Secondary | ICD-10-CM

## 2012-07-07 MED ORDER — CLOPIDOGREL BISULFATE 75 MG PO TABS: 75.0000 mg | ORAL_TABLET | Freq: Every day | ORAL | Status: DC

## 2012-07-07 MED ORDER — ROSUVASTATIN CALCIUM 10 MG PO TABS: 10.0000 mg | ORAL_TABLET | Freq: Every day | ORAL | Status: DC

## 2012-07-07 MED ORDER — AMLODIPINE BESYLATE 10 MG PO TABS: 10.0000 mg | ORAL_TABLET | Freq: Every day | ORAL | Status: DC

## 2012-07-07 MED ORDER — FAMOTIDINE 20 MG PO TABS: 20.0000 mg | ORAL_TABLET | Freq: Every day | ORAL | Status: DC

## 2012-07-07 MED ORDER — NITROGLYCERIN 0.4 MG SL SUBL: 0.4000 mg | SUBLINGUAL_TABLET | SUBLINGUAL | Status: DC | PRN

## 2012-07-07 MED ORDER — LEVOTHYROXINE SODIUM 125 MCG PO TABS: 125.0000 ug | ORAL_TABLET | Freq: Every day | ORAL | Status: DC

## 2012-07-07 MED ORDER — ISOSORBIDE MONONITRATE ER 30 MG PO TB24: 30.0000 mg | ORAL_TABLET | Freq: Every day | ORAL | Status: DC

## 2012-07-07 NOTE — Progress Notes (Signed)
HPI [name] is a 66 y.o. female with history of CAD. She was admitted November 2012 with an NSTEMI. LHC 11/19/11: pLAD 30%, oOM 60%, AVCFX 30%, CFX after OM2 30%, pRCA 60 and 70%, then 99%, AM 80-90% with TIMI 3 flow. PCI: Promus DES x 2 to RCA. Echocardiogram 11/19/11: EF 60-65%, normal LV wall thickness, grade 1 diastolic dysfunction. Of note, she does have primary hypoparathyroidism with hypocalcemia. She also has hypothyroidism and is status post thyroidectomy due to multinodular goiter.  The patient was recently admitted to [facility] (5/30) with CP.  Felt to be noncardiac and sent home. The patient says she still has intermittent episodes of CP.  Occur when she is active, exerciseing ( in middle of work out)  Chest tightness.  Eases when slows.   This is different that when she was in cardiac rehab.  She says she was able to do same level of exercise then but did not have problems. Denies symptoms at rest.  Breathning is unchanged. Has problems at work.  Very tiring.  Has difficulty keeping up with longer shifts. No Known Allergies  Current Outpatient Prescriptions  Medication Sig Dispense Refill  . amLODipine (NORVASC) 10 MG tablet Take 1 tablet (10 mg total) by mouth daily.  90 tablet  3  . aspirin EC 81 MG tablet Take 81 mg by mouth daily.        . calcitRIOL (ROCALTROL) 0.25 MCG capsule Take 2 capsules (0.5 mcg total) by mouth 2 (two) times daily.  120 capsule  3  . calcium carbonate (TUMS - DOSED IN MG ELEMENTAL CALCIUM) 500 MG chewable tablet Chew 4 tablets by mouth 2 (two) times daily.       . clopidogrel (PLAVIX) 75 MG tablet Take 1 tablet (75 mg total) by mouth daily with breakfast.  90 tablet  3  . famotidine (PEPCID) 20 MG tablet Take 1 tablet (20 mg total) by mouth daily.  90 tablet  3  . levothyroxine (SYNTHROID, LEVOTHROID) 125 MCG tablet Take 1 tablet (125 mcg total) by mouth daily.  30 tablet  6  . nitroGLYCERIN (NITROSTAT) 0.4 MG SL tablet Place 1 tablet (0.4 mg  total) under the tongue every 5 (five) minutes as needed for chest pain (Up to 3 doses.).  25 tablet  4  . rosuvastatin (CRESTOR) 10 MG tablet Take 1 tablet (10 mg total) by mouth daily.  90 tablet  3  . vitamin D, CHOLECALCIFEROL, 400 UNITS tablet Take 1 tablet (400 Units total) by mouth 2 (two) times daily.  60 tablet  1  . UNABLE TO FIND Ms. [name] was admitted to the [facility] from 05/22/12-05/23/12 for medical evaluation and treatment. She is being discharged from the hospital on 05/23/12 and may return to work without any new restrictions. Please contact us for any further assistance. Thank you. [facility], Phone # [phone].  1 Units  0    Past Medical History  Diagnosis Date  . GERD (gastroesophageal reflux disease)     occasional  . Hyperlipidemia   . Hypertension     Echocardiogram 11/19/11: Difficult acoustic windows, EF 60-65%, normal LV wall thickness, grade 1 diastolic dysfunction  . Vertigo   . Tubular adenoma of colon 06/2011  . Colitis   . Multinodular thyroid     Goiter s/p thyroidectomy in 2007 with post-op  hypocalcemia and post-op hypothyroidism/hypoparathyroidism with hypocalcemia  . CAD (coronary artery disease)     a) NSTEMI 10/2011: LHC 11/19/11:  pLAD 30%, oOM 60%, AVCFX 30%, CFX after OM2 30%, pRCA 60 and 70%, then 99%, AM 80-90% with TIMI 3 flow.  PCI: Promus DES x 2 to RCA.  Marland Kitchen Post-surgical hypothyroidism   . Hypoparathyroidism   . Palpitations     a) 03/2012 - patient set up for event monitor but did not wear correctly -  she declined wearing a repeat monitor    Past Surgical History  Procedure Date  . Colonoscopy Aenomatous polyps    07/05/2011  . Total thyroidectomy 2007    GOITER  . Shoulder arthroscopy w/ rotator cuff repair     right    Family History  Problem Relation Age of Onset  . Colon cancer Brother   . Cancer Brother     COLON  . Hypertension Father   . Diabetes Neg Hx   . Prostate cancer Neg Hx   .  Heart disease Father   . Breast cancer Neg Hx     History   Social History  . Marital Status: Single    Spouse Name: N/A    Number of Children: N/A  . Years of Education: N/A   Occupational History  . Not on file.   Social History Main Topics  . Smoking status: Never Smoker   . Smokeless tobacco: Never Used  . Alcohol Use: No  . Drug Use: No  . Sexually Active: Yes   Other Topics Concern  . Not on file   Social History Narrative  . No narrative on file    Review of Systems:  All systems reviewed.  They are negative to the above problem except as previously stated.  Vital Signs: BP 139/73  Pulse 63  Ht 5\' 3"  (1.6 m)  Wt 187 lb 1.9 oz (84.877 kg)  BMI 33.15 kg/m2  Physical Exam Patient is in NAD HEENT:  Normocephalic, atraumatic. EOMI, PERRLA.  Neck: JVP is normal. No thyromegaly. No bruits.  Lungs: clear to auscultation. No rales no wheezes.  Heart: Regular rate and rhythm. Normal S1, S2. No S3.   No significant murmurs. PMI not displaced.  Abdomen:  Supple, nontender. Normal bowel sounds. No masses. No hepatomegaly.  Extremities:   Good distal pulses throughout. No lower extremity edema.  Musculoskeletal :moving all extremities.  Neuro:   alert and oriented x3.  CN II-XII grossly intact.   Assessment and Plan:  1.  CAD>  Symptoms now are different that what was reported in hospital.  Then felt to be musculoskeletal She does have diffuse CAD>   I would recomm a myoview to evaluate.Kepp on current meds.  And add IMdur 30 to regimen Review recent labsl  2.  HL  Keep on Crestor.

## 2012-07-07 NOTE — Patient Instructions (Signed)
Your physician has requested that you have a lexiscan myoview. For further information please visit [email or web address] Please follow instruction sheet, as given.  Will call you with results.

## 2012-07-14 ENCOUNTER — Ambulatory Visit (HOSPITAL_COMMUNITY): Payer: 59 | Attending: Cardiology | Admitting: Radiology

## 2012-07-14 ENCOUNTER — Encounter: Payer: Self-pay | Admitting: *Deleted

## 2012-07-14 VITALS — BP 113/62 | HR 58 | Ht 63.0 in | Wt 188.0 lb

## 2012-07-14 DIAGNOSIS — R000 Tachycardia, unspecified: Secondary | ICD-10-CM | POA: Insufficient documentation

## 2012-07-14 DIAGNOSIS — Z8249 Family history of ischemic heart disease and other diseases of the circulatory system: Secondary | ICD-10-CM | POA: Insufficient documentation

## 2012-07-14 DIAGNOSIS — R079 Chest pain, unspecified: Secondary | ICD-10-CM

## 2012-07-14 DIAGNOSIS — I779 Disorder of arteries and arterioles, unspecified: Secondary | ICD-10-CM | POA: Insufficient documentation

## 2012-07-14 DIAGNOSIS — I10 Essential (primary) hypertension: Secondary | ICD-10-CM | POA: Insufficient documentation

## 2012-07-14 DIAGNOSIS — I2510 Atherosclerotic heart disease of native coronary artery without angina pectoris: Secondary | ICD-10-CM

## 2012-07-14 DIAGNOSIS — R9439 Abnormal result of other cardiovascular function study: Secondary | ICD-10-CM | POA: Insufficient documentation

## 2012-07-14 DIAGNOSIS — R0789 Other chest pain: Secondary | ICD-10-CM | POA: Insufficient documentation

## 2012-07-14 DIAGNOSIS — I214 Non-ST elevation (NSTEMI) myocardial infarction: Secondary | ICD-10-CM

## 2012-07-14 DIAGNOSIS — E669 Obesity, unspecified: Secondary | ICD-10-CM | POA: Insufficient documentation

## 2012-07-14 DIAGNOSIS — E785 Hyperlipidemia, unspecified: Secondary | ICD-10-CM | POA: Insufficient documentation

## 2012-07-14 MED ORDER — TECHNETIUM TC 99M TETROFOSMIN IV KIT
11.0000 | PACK | Freq: Once | INTRAVENOUS | Status: AC | PRN
  Administered 2012-07-14: 11 via INTRAVENOUS

## 2012-07-14 MED ORDER — TECHNETIUM TC 99M TETROFOSMIN IV KIT
33.0000 | PACK | Freq: Once | INTRAVENOUS | Status: AC | PRN
  Administered 2012-07-14: 33 via INTRAVENOUS

## 2012-07-14 NOTE — Progress Notes (Signed)
[facility] SITE 3 NUCLEAR MED [address] [place] [address] [phone]  Cardiology Nuclear Med Study  [name] is a 66 y.o. female     MRN : [id number]     DOB: January 30, 1946  Procedure Date: 07/14/2012  Nuclear Med Background Indication for Stress Test:  Evaluation for Ischemia and Stent Patency History:  '09 ION:GEXBMW, EF=63%; 11/12 Echo:EF=60-65%; 11/12 NSTEMI>Stent x 2-RCA Cardiac Risk Factors: Carotid Disease, Family History - CAD, Hypertension, Lipids and Obesity  Symptoms:  Chest Tightness with and without Exertion (last episode of chest discomfort was about a month ago) and Rapid HR   Nuclear Pre-Procedure Caffeine/Decaff Intake:  None NPO After: 9:00pm   Lungs:  Clear. IV 0.9% NS with Angio Cath:  22g  IV Site: R Antecubital  IV Started by:  Stanton Kidney, EMT-P  Chest Size (in):  38 Cup Size: D  Height: 5\' 3"  (1.6 m)  Weight:  188 lb (85.276 kg)  BMI:  Body mass index is 33.30 kg/(m^2). Tech Comments:  NA    Nuclear Med Study 1 or 2 day study: 1 day  Stress Test Type:  Stress  Reading MD: Olga Millers, MD  Order Authorizing Provider:  Dietrich Pates, MD  Resting Radionuclide: Technetium 68m Tetrofosmin  Resting Radionuclide Dose: 11.0 mCi   Stress Radionuclide:  Technetium 14m Tetrofosmin  Stress Radionuclide Dose: 31.0 mCi           Stress Protocol Rest HR: 58 Stress HR: 133  Rest BP: 113/62 Stress BP: 206/93  Exercise Time (min): 8:01 METS: 10.1   Predicted Max HR: 155 bpm % Max HR: 85.81 bpm Rate Pressure Product: 41324   Dose of Adenosine (mg):  n/a Dose of Lexiscan: n/a mg  Dose of Atropine (mg): n/a Dose of Dobutamine: n/a mcg/kg/min (at max HR)  Stress Test Technologist: Smiley Houseman, CMA-N  Nuclear Technologist:  Domenic Polite, CNMT     Rest Procedure:  Myocardial perfusion imaging was performed at rest 45 minutes following the intravenous administration of Technetium 55m Tetrofosmin.  Rest ECG: Early  repolarization.  Stress Procedure:  The patient exercised on the treadmill utilizing the Bruce protocol for 8:01 minutes. She then stopped due to fatigue and denied any chest pain.  There were no diagnostic ST-T wave changes.  Technetium 62m Tetrofosmin was injected at peak exercise and myocardial perfusion imaging was performed after a brief delay.  Stress ECG: No significant ST segment change suggestive of ischemia.  QPS Raw Data Images:  Acquisition technically good; normal left ventricular size. Stress Images:  There is decreased uptake in the inferior wall. Rest Images:  There is decreased uptake in the inferior wall, less prominent compared to the stress images. Subtraction (SDS):  These findings are consistent with prior inferior infarct and mild to moderate peri-infarct ischemia. Transient Ischemic Dilatation (Normal <1.22):  1.00 Lung/Heart Ratio (Normal <0.45):  0.32  Quantitative Gated Spect Images QGS EDV:  86 ml QGS ESV:  25 ml  Impression Exercise Capacity:  Fair exercise capacity. BP Response:  Hypertensive blood pressure response. Clinical Symptoms:  There is dyspnea. ECG Impression:  No significant ST segment change suggestive of ischemia. Comparison with Prior Nuclear Study: No previous nuclear study performed  Overall Impression:  Abnormal stress nuclear study with a moderate size, medium intensity, partially reversible inferior defect consistent with prior inferior infarct and mild to moderate peri-infarct ischemia.  LV Ejection Fraction: 71%.  LV Wall Motion:  NL LV Function; NL Wall Motion  [name]  [name]

## 2012-07-16 ENCOUNTER — Telehealth: Payer: Self-pay | Admitting: *Deleted

## 2012-07-16 NOTE — Telephone Encounter (Signed)
Called patient concerning positive stress myoview reviewed by Dr.Stuckey (DOD). Dr.Stuckey advised that she have a left heart cath in the main lab. Patient has agreed to this. She will have labs and a PA workup on 7/25 and arrive at the short stay center at [facility] at 7am for a 9am case. She is aware NPO after midnight and to take medications with water. Patient mentioned that she stopped taking Isosorbide due to headaches. She stated that the medication helped the chest pains but she could not tolerate the headaches despite taking Tylenol PRN with some relief.

## 2012-07-17 ENCOUNTER — Encounter (HOSPITAL_COMMUNITY): Payer: Self-pay | Admitting: Cardiology

## 2012-07-17 ENCOUNTER — Encounter (HOSPITAL_COMMUNITY)
Admission: RE | Disposition: A | Discharge status: Home / Self Care | Payer: Self-pay | Source: Ambulatory Visit | Attending: Cardiology

## 2012-07-17 ENCOUNTER — Ambulatory Visit (HOSPITAL_COMMUNITY)
Admission: RE | Admit: 2012-07-17 | Discharge: 2012-07-17 | Disposition: A | Discharge status: Home / Self Care | Payer: 59 | Source: Ambulatory Visit | Attending: Cardiology | Admitting: Cardiology

## 2012-07-17 DIAGNOSIS — I2510 Atherosclerotic heart disease of native coronary artery without angina pectoris: Secondary | ICD-10-CM | POA: Insufficient documentation

## 2012-07-17 DIAGNOSIS — I10 Essential (primary) hypertension: Secondary | ICD-10-CM | POA: Insufficient documentation

## 2012-07-17 DIAGNOSIS — E890 Postprocedural hypothyroidism: Secondary | ICD-10-CM | POA: Insufficient documentation

## 2012-07-17 DIAGNOSIS — K219 Gastro-esophageal reflux disease without esophagitis: Secondary | ICD-10-CM | POA: Insufficient documentation

## 2012-07-17 DIAGNOSIS — E210 Primary hyperparathyroidism: Secondary | ICD-10-CM | POA: Insufficient documentation

## 2012-07-17 DIAGNOSIS — Z9861 Coronary angioplasty status: Secondary | ICD-10-CM | POA: Insufficient documentation

## 2012-07-17 HISTORY — PX: LEFT HEART CATHETERIZATION WITH CORONARY ANGIOGRAM: SHX5451

## 2012-07-17 LAB — BASIC METABOLIC PANEL
BUN: 22 mg/dL (ref 6–23)
Chloride: 104 mEq/L (ref 96–112)
GFR calc Af Amer: 58 mL/min — ABNORMAL LOW (ref >90)
GFR calc non Af Amer: 50 mL/min — ABNORMAL LOW (ref >90)
Potassium: 3.7 mEq/L (ref 3.5–5.1)
Sodium: 142 mEq/L (ref 135–145)

## 2012-07-17 LAB — PROTIME-INR: Prothrombin Time: 12.7 seconds (ref 11.6–15.2)

## 2012-07-17 LAB — CBC
HCT: 37.8 % (ref 36.0–46.0)
Hemoglobin: 12.4 g/dL (ref 12.0–15.0)
MCHC: 32.8 g/dL (ref 30.0–36.0)
RBC: 4.48 MIL/uL (ref 3.87–5.11)
WBC: 7.9 10*3/uL (ref 4.0–10.5)

## 2012-07-17 SURGERY — LEFT HEART CATHETERIZATION WITH CORONARY ANGIOGRAM
Anesthesia: LOCAL

## 2012-07-17 MED ORDER — SODIUM CHLORIDE 0.9 % IJ SOLN: 3.0000 mL | INTRAMUSCULAR | Status: DC | PRN

## 2012-07-17 MED ORDER — MIDAZOLAM HCL 2 MG/2ML IJ SOLN
INTRAMUSCULAR | Status: AC
  Filled 2012-07-17: qty 2

## 2012-07-17 MED ORDER — ASPIRIN 81 MG PO CHEW
CHEWABLE_TABLET | ORAL | Status: AC
  Administered 2012-07-17: 324 mg via ORAL
  Filled 2012-07-17: qty 4

## 2012-07-17 MED ORDER — SODIUM CHLORIDE 0.9 % IJ SOLN: 3.0000 mL | Freq: Two times a day (BID) | INTRAMUSCULAR | Status: DC

## 2012-07-17 MED ORDER — LIDOCAINE HCL (PF) 1 % IJ SOLN
INTRAMUSCULAR | Status: AC
  Filled 2012-07-17: qty 30

## 2012-07-17 MED ORDER — FENTANYL CITRATE 0.05 MG/ML IJ SOLN
INTRAMUSCULAR | Status: AC
  Filled 2012-07-17: qty 2

## 2012-07-17 MED ORDER — SODIUM CHLORIDE 0.9 % IV SOLN: INTRAVENOUS | Status: DC

## 2012-07-17 MED ORDER — NITROGLYCERIN 0.2 MG/ML ON CALL CATH LAB
INTRAVENOUS | Status: AC
  Filled 2012-07-17: qty 1

## 2012-07-17 MED ORDER — SODIUM CHLORIDE 0.9 % IV SOLN
INTRAVENOUS | Status: DC
  Administered 2012-07-17: 08:00:00 via INTRAVENOUS

## 2012-07-17 MED ORDER — DIAZEPAM 5 MG PO TABS
5.0000 mg | ORAL_TABLET | ORAL | Status: AC
  Administered 2012-07-17: 5 mg via ORAL

## 2012-07-17 MED ORDER — SODIUM CHLORIDE 0.9 % IV SOLN: 250.0000 mL | INTRAVENOUS | Status: DC | PRN

## 2012-07-17 MED ORDER — ONDANSETRON HCL 4 MG/2ML IJ SOLN: 4.0000 mg | Freq: Four times a day (QID) | INTRAMUSCULAR | Status: DC | PRN

## 2012-07-17 MED ORDER — ASPIRIN 81 MG PO CHEW
324.0000 mg | CHEWABLE_TABLET | ORAL | Status: AC
  Administered 2012-07-17: 324 mg via ORAL

## 2012-07-17 MED ORDER — HEPARIN (PORCINE) IN NACL 2-0.9 UNIT/ML-% IJ SOLN
INTRAMUSCULAR | Status: AC
  Filled 2012-07-17: qty 2000

## 2012-07-17 MED ORDER — DIAZEPAM 5 MG PO TABS
ORAL_TABLET | ORAL | Status: AC
  Administered 2012-07-17: 5 mg via ORAL
  Filled 2012-07-17: qty 1

## 2012-07-17 MED ORDER — ACETAMINOPHEN 325 MG PO TABS: 650.0000 mg | ORAL_TABLET | ORAL | Status: DC | PRN

## 2012-07-17 NOTE — Interval H&P Note (Signed)
History and Physical Interval Note:  07/17/2012 10:51 AM  [name]  has presented today for surgery, with the diagnosis of Chest pain  The various methods of treatment have been discussed with the patient and family. After consideration of risks, benefits and other options for treatment, the patient has consented to  Procedure(s) (LRB): LEFT HEART CATHETERIZATION WITH CORONARY ANGIOGRAM (N/A) as a surgical intervention .  The patient's history has been reviewed, patient examined, no change in status, stable for surgery.  I have reviewed the patient's chart and labs.  Questions were answered to the patient's satisfaction.     Shawnie Pons

## 2012-07-17 NOTE — CV Procedure (Signed)
   Cardiac Catheterization Procedure Note  Name: [name] MRN: [id number] DOB: 08-11-1946  Procedure: Left Heart Cath, Selective Coronary Angiography, LV angiography  Indication: positive stress test with prior MI, and stent   Procedural details: The right groin was prepped, draped, and anesthetized with 1% lidocaine. Using modified Seldinger technique, a 5 French sheath was introduced into the right femoral artery. Standard Judkins catheters were used for coronary angiography and left ventriculography. Catheter exchanges were performed over a guidewire. There were no immediate procedural complications. The patient was transferred to the post catheterization recovery area for further monitoring.  Procedural Findings: Hemodynamics:  AO 141/58 (91) LV 136/11   Coronary angiography: Coronary dominance: right  Left mainstem: Calcified with mild ostial tapering.   Left anterior descending (LAD): 30% proximal and calcified plaque with significant narrowing.  The distal vessel collateralizes the distal acute marginal from the RCA, which supplies the distal PDA territory  Left circumflex (LCx): large caliber vessel.  There is 40% ostial narrowing of the OM1 or ramus.  The AV circ, which supplies three additional branches, is without narrowing.   Right coronary artery (RCA): This was previously stented.  The overlapping mid stents are widely patent supplying a smaller PDA and moderate PLA system.  There is slow flow in the second AM branch which is known to be a somewhat larger vessel with subtotal ostial narrowing.  This exits mid stent.  The vessel is collateralized.    Left ventriculography: Left ventricular systolic function is normal, LVEF is estimated at 55-65%, there is no significant mitral regurgitation   Final Conclusions:   1.  Continued patency of the RCA stents to the distal RCA 2.  No change in the LCA system with collateral flow to the distal AM territory (which supplies  the distal PDA territory) 3.  Subtotal ostial occlusion of the acute marginal of the RCA  (See above)  Recommendations:  1.  Would favor medical therapy.  If symptoms are not controlled, an attempt can be made at opening the acute marginal, but it would come likely at the expense of some deformity of the RCA stent.   Reviewed with family.    Shawnie Pons 07/17/2012, 12:14 PM

## 2012-07-17 NOTE — H&P (Signed)
HPI [name] is a 66 y.o. female with history of CAD. She was admitted November 2012 with an NSTEMI. LHC 11/19/11: pLAD 30%, oOM 60%, AVCFX 30%, CFX after OM2 30%, pRCA 60 and 70%, then 99%, AM 80-90% with TIMI 3 flow. PCI: Promus DES x 2 to RCA. Echocardiogram 11/19/11: EF 60-65%, normal LV wall thickness, grade 1 diastolic dysfunction. Of note, she does have primary hypoparathyroidism with hypocalcemia. She also has hypothyroidism and is status post thyroidectomy due to multinodular goiter.  The patient was recently admitted to [facility] (5/30) with CP.  Felt to be noncardiac and sent home. The patient says she still has intermittent episodes of CP.  Occur when she is active, exerciseing ( in middle of work out)  Chest tightness.  Eases when slows.   This is different that when she was in cardiac rehab.  She says she was able to do same level of exercise then but did not have problems. Denies symptoms at rest.  Breathning is unchanged. Has problems at work.  Very tiring.  Has difficulty keeping up with longer shifts. No Known Allergies  Current Facility-Administered Medications  Medication Dose Route Frequency Provider Last Rate Last Dose  . 0.9 %  sodium chloride infusion  250 mL Intravenous PRN Herby Abraham, MD      . 0.9 %  sodium chloride infusion   Intravenous Continuous Herby Abraham, MD 75 mL/hr at 07/17/12 [phone]    . aspirin chewable tablet 324 mg  324 mg Oral Pre-Cath Herby Abraham, MD   324 mg at 07/17/12 9604  . diazepam (VALIUM) tablet 5 mg  5 mg Oral On Call Herby Abraham, MD   5 mg at 07/17/12 0824  . sodium chloride 0.9 % injection 3 mL  3 mL Intravenous Q12H Herby Abraham, MD      . sodium chloride 0.9 % injection 3 mL  3 mL Intravenous PRN Herby Abraham, MD        Past Medical History  Diagnosis Date  . GERD (gastroesophageal reflux disease)     occasional  . Hyperlipidemia   . Hypertension     Echocardiogram 11/19/11: Difficult acoustic windows,  EF 60-65%, normal LV wall thickness, grade 1 diastolic dysfunction  . Vertigo   . Tubular adenoma of colon 06/2011  . Colitis   . Multinodular thyroid     Goiter s/p thyroidectomy in 2007 with post-op  hypocalcemia and post-op hypothyroidism/hypoparathyroidism with hypocalcemia  . CAD (coronary artery disease)     a) NSTEMI 10/2011: LHC 11/19/11: pLAD 30%, oOM 60%, AVCFX 30%, CFX after OM2 30%, pRCA 60 and 70%, then 99%, AM 80-90% with TIMI 3 flow.  PCI: Promus DES x 2 to RCA.  Marland Kitchen Post-surgical hypothyroidism   . Hypoparathyroidism   . Palpitations     a) 03/2012 - patient set up for event monitor but did not wear correctly -  she declined wearing a repeat monitor    Past Surgical History  Procedure Date  . Colonoscopy Aenomatous polyps    07/05/2011  . Total thyroidectomy 2007    GOITER  . Shoulder arthroscopy w/ rotator cuff repair     right    Family History  Problem Relation Age of Onset  . Colon cancer Brother   . Cancer Brother     COLON  . Hypertension Father   . Diabetes Neg Hx   . Prostate cancer Neg Hx   . Heart disease Father   .  Breast cancer Neg Hx     History   Social History  . Marital Status: Single    Spouse Name: N/A    Number of Children: N/A  . Years of Education: N/A   Occupational History  . Not on file.   Social History Main Topics  . Smoking status: Never Smoker   . Smokeless tobacco: Never Used  . Alcohol Use: No  . Drug Use: No  . Sexually Active: Yes   Other Topics Concern  . Not on file   Social History Narrative  . No narrative on file    Review of Systems:  All systems reviewed.  They are negative to the above problem except as previously stated.  Vital Signs: BP 126/69  Pulse 65  Temp 97.1 F (36.2 C) (Oral)  Resp 18  Ht 5\' 3"  (1.6 m)  Wt 188 lb (85.276 kg)  BMI 33.30 kg/m2  SpO2 95%  Physical Exam Patient is in NAD HEENT:  Normocephalic, atraumatic. EOMI, PERRLA.  Neck: JVP is normal. No thyromegaly. No bruits.    Lungs: clear to auscultation. No rales no wheezes.  Heart: Regular rate and rhythm. Normal S1, S2. No S3.   No significant murmurs. PMI not displaced.  Abdomen:  Supple, nontender. Normal bowel sounds. No masses. No hepatomegaly.  Extremities:   Good distal pulses throughout. No lower extremity edema.  Musculoskeletal :moving all extremities.  Neuro:   alert and oriented x3.  CN II-XII grossly intact.   Assessment and Plan:  1.  CAD>  Symptoms now are different that what was reported in hospital.  Then felt to be musculoskeletal She does have diffuse CAD>   I would recomm a myoview to evaluate.Kepp on current meds.  And add IMdur 30 to regimen Review recent labsl  2.  HL  Keep on Crestor.  ADDENDUM: Pt originally seen by Dr. [name] on 07/07/2012 when this note was written. She had a stress test on 07/14/2012, results listed below.  Lexi scan Myoview 07/14/2012 Overall Impression: Abnormal stress nuclear study with a moderate size, medium intensity, partially reversible inferior defect consistent with prior inferior infarct and mild to moderate peri-infarct ischemia.  LV Ejection Fraction: 71%. LV Wall Motion: NL LV Function; NL Wall Motion   The results were reviewed and cardiac catheterization was recommended. The patient is here today for the procedure. She is currently pain-free and her exam is unchanged.  The patient is stable for the procedure.   Patient seen and examined.  I know her from prior emergency PCI.  She has recurrent chest pain with abnormal nuc study.  She understands and agrees to proceed with repeat cardiac cath.

## 2012-07-21 ENCOUNTER — Telehealth: Payer: Self-pay | Admitting: Internal Medicine

## 2012-07-21 NOTE — Telephone Encounter (Signed)
Called patient back. She had a heart cath done last week and is looking for information concerning the medical therapy that Dr.Stuckey discussed after the cath. Patient did not tolerate nitrates due to headache and dizziness. Advised will discuss with Dr.Ross and call her back.

## 2012-07-21 NOTE — Telephone Encounter (Signed)
Pt calling re cath last week, has questions re med

## 2012-07-29 ENCOUNTER — Other Ambulatory Visit: Payer: Self-pay

## 2012-07-29 MED ORDER — LEVOTHYROXINE SODIUM 125 MCG PO TABS: 125.0000 ug | ORAL_TABLET | Freq: Every day | ORAL | Status: DC

## 2012-07-31 NOTE — Telephone Encounter (Signed)
After review I would recomm metoprolol 25 bid F/u in clinic in 3 to 4 wks if not already scheduled.

## 2012-07-31 NOTE — Telephone Encounter (Signed)
LMOM for call back. 

## 2012-08-03 ENCOUNTER — Other Ambulatory Visit: Payer: Self-pay | Admitting: Physician Assistant

## 2012-08-04 ENCOUNTER — Other Ambulatory Visit: Payer: Self-pay | Admitting: *Deleted

## 2012-08-04 ENCOUNTER — Ambulatory Visit: Payer: 59 | Admitting: Internal Medicine

## 2012-08-04 NOTE — Telephone Encounter (Signed)
Pt rtn your call from friday °

## 2012-08-04 NOTE — Telephone Encounter (Signed)
Patient called back. She decided to go back on the Imdur and is tolerating so she will not start the Metoprolol. Will let Dr.Ross know. I made her a post cath visit with Dr.Ross for 8/19 at 1045 am.

## 2012-08-05 ENCOUNTER — Ambulatory Visit (INDEPENDENT_AMBULATORY_CARE_PROVIDER_SITE_OTHER): Payer: 59

## 2012-08-05 DIAGNOSIS — Z13820 Encounter for screening for osteoporosis: Secondary | ICD-10-CM

## 2012-08-05 DIAGNOSIS — N951 Menopausal and female climacteric states: Secondary | ICD-10-CM

## 2012-08-11 ENCOUNTER — Ambulatory Visit (INDEPENDENT_AMBULATORY_CARE_PROVIDER_SITE_OTHER): Payer: 59 | Admitting: Internal Medicine

## 2012-08-11 ENCOUNTER — Encounter: Payer: Self-pay | Admitting: Internal Medicine

## 2012-08-11 VITALS — BP 130/72 | HR 60 | Ht 63.0 in | Wt 191.0 lb

## 2012-08-11 DIAGNOSIS — I2510 Atherosclerotic heart disease of native coronary artery without angina pectoris: Secondary | ICD-10-CM

## 2012-08-11 DIAGNOSIS — E782 Mixed hyperlipidemia: Secondary | ICD-10-CM

## 2012-08-11 DIAGNOSIS — I4891 Unspecified atrial fibrillation: Secondary | ICD-10-CM

## 2012-08-11 LAB — CBC WITH DIFFERENTIAL/PLATELET
Eosinophils Relative: 3.4 % (ref 0.0–5.0)
HCT: 37.8 % (ref 36.0–46.0)
Hemoglobin: 12.6 g/dL (ref 12.0–15.0)
Lymphs Abs: 2.6 10*3/uL (ref 0.7–4.0)
MCV: 83.1 fl (ref 78.0–100.0)
Monocytes Absolute: 0.6 10*3/uL (ref 0.1–1.0)
Monocytes Relative: 6.8 % (ref 3.0–12.0)
Neutro Abs: 5.5 10*3/uL (ref 1.4–7.7)
Platelets: 236 10*3/uL (ref 150.0–400.0)
WBC: 9.1 10*3/uL (ref 4.5–10.5)

## 2012-08-11 LAB — LIPID PANEL
Cholesterol: 152 mg/dL (ref 0–200)
Total CHOL/HDL Ratio: 4
VLDL: 44.2 mg/dL — ABNORMAL HIGH (ref 0.0–40.0)

## 2012-08-11 LAB — BASIC METABOLIC PANEL
BUN: 19 mg/dL (ref 6–23)
Chloride: 102 mEq/L (ref 96–112)
GFR: 69.35 mL/min (ref >60.00)
Potassium: 4.3 mEq/L (ref 3.5–5.1)
Sodium: 140 mEq/L (ref 135–145)

## 2012-08-11 LAB — AST: AST: 18 U/L (ref 0–37)

## 2012-08-11 LAB — LDL CHOLESTEROL, DIRECT: Direct LDL: 83.8 mg/dL

## 2012-08-11 NOTE — Patient Instructions (Addendum)
Lab work today. We will call you with results.  Your physician wants you to follow-up in: end of JAN. 2014You will receive a reminder letter in the mail two months in advance. If you don't receive a letter, please call our office to schedule the follow-up appointment.

## 2012-08-11 NOTE — Progress Notes (Signed)
HPI Patient is a 66 year old with a history of CAD.  She recently had a positive stress test.  Underwent L heart cath on 7/25  This showed continued patency of RCA stents.  There was no change in the LCA with collateralls to R acut marginal (that supplies the distal PDA).  There was subtotal occlusion of the acute marginal (exits mid stent).  Dr. Riley Kill reviewed.  Recomm medical Rx before trying intervention. I had recomm metoprolol 25 bid  She did not want to try.  Wanted to go back on IMdur.  She says she is doing fairly well.  Does have some CP.  Feels better on Imdur  Does have arthritis pain esp in legs.  Wants to take pain meds. No Known Allergies   Current Outpatient Prescriptions  Medication Sig Dispense Refill  . amLODipine (NORVASC) 10 MG tablet Take 1 tablet (10 mg total) by mouth daily.  90 tablet  3  . aspirin EC 81 MG tablet Take 81 mg by mouth daily.        . calcitRIOL (ROCALTROL) 0.25 MCG capsule Take 2 capsules (0.5 mcg total) by mouth 2 (two) times daily.  120 capsule  3  . calcium carbonate (TUMS - DOSED IN MG ELEMENTAL CALCIUM) 500 MG chewable tablet Chew 4 tablets by mouth 2 (two) times daily.       . clopidogrel (PLAVIX) 75 MG tablet Take 1 tablet (75 mg total) by mouth daily with breakfast.  90 tablet  3  . famotidine (PEPCID) 20 MG tablet Take 1 tablet (20 mg total) by mouth daily.  90 tablet  3  . isosorbide mononitrate (IMDUR) 30 MG 24 hr tablet Take 1 tablet (30 mg total) by mouth daily.      Marland Kitchen levothyroxine (SYNTHROID, LEVOTHROID) 125 MCG tablet Take 1 tablet (125 mcg total) by mouth daily.  90 tablet  3  . nitroGLYCERIN (NITROSTAT) 0.4 MG SL tablet Place 1 tablet (0.4 mg total) under the tongue every 5 (five) minutes as needed for chest pain (Up to 3 doses.).  25 tablet  3  . rosuvastatin (CRESTOR) 10 MG tablet Take 1 tablet (10 mg total) by mouth daily.  90 tablet  3  . vitamin D, CHOLECALCIFEROL, 400 UNITS tablet Take 1 tablet (400 Units total) by mouth 2 (two)  times daily.  60 tablet  1    Past Medical History  Diagnosis Date  . GERD (gastroesophageal reflux disease)     occasional  . Hyperlipidemia   . Hypertension     Echocardiogram 11/19/11: Difficult acoustic windows, EF 60-65%, normal LV wall thickness, grade 1 diastolic dysfunction  . Vertigo   . Tubular adenoma of colon 06/2011  . Colitis   . Multinodular thyroid     Goiter s/p thyroidectomy in 2007 with post-op  hypocalcemia and post-op hypothyroidism/hypoparathyroidism with hypocalcemia  . CAD (coronary artery disease)     a) NSTEMI 10/2011: LHC 11/19/11: pLAD 30%, oOM 60%, AVCFX 30%, CFX after OM2 30%, pRCA 60 and 70%, then 99%, AM 80-90% with TIMI 3 flow.  PCI: Promus DES x 2 to RCA.  Marland Kitchen Post-surgical hypothyroidism   . Hypoparathyroidism   . Palpitations     a) 03/2012 - patient set up for event monitor but did not wear correctly -  she declined wearing a repeat monitor    Past Surgical History  Procedure Date  . Colonoscopy Aenomatous polyps    07/05/2011  . Total thyroidectomy 2007    GOITER  .  Shoulder arthroscopy w/ rotator cuff repair     right    Family History  Problem Relation Age of Onset  . Colon cancer Brother   . Cancer Brother     COLON  . Hypertension Father   . Diabetes Neg Hx   . Prostate cancer Neg Hx   . Heart disease Father   . Breast cancer Neg Hx     History   Social History  . Marital Status: Single    Spouse Name: N/A    Number of Children: N/A  . Years of Education: N/A   Occupational History  . Not on file.   Social History Main Topics  . Smoking status: Never Smoker   . Smokeless tobacco: Never Used  . Alcohol Use: No  . Drug Use: No  . Sexually Active: Yes   Other Topics Concern  . Not on file   Social History Narrative  . No narrative on file    Review of Systems:  All systems reviewed.  They are negative to the above problem except as previously stated.  Vital Signs: BP 130/72  Pulse 60  Ht 5\' 3"  (1.6 m)  Wt  191 lb (86.637 kg)  BMI 33.83 kg/m2  Physical Exam Patient is in NAD HEENT:  Normocephalic, atraumatic. EOMI, PERRLA.  Neck: JVP is normal.  No bruits.  Lungs: clear to auscultation. No rales no wheezes.  Heart: Regular rate and rhythm. Normal S1, S2. No S3.   No significant murmurs. PMI not displaced.  Abdomen:  Supple, nontender. Normal bowel sounds. No masses. No hepatomegaly.  Extremities:   Good distal pulses throughout. No lower extremity edema.  Musculoskeletal :moving all extremities.  Neuro:   alert and oriented x3.  CN II-XII grossly intact.   Assessment and Plan:   1.  CAD.  Recent cath.  Stenosis in acute marginal would be difficult to intervene on.  REcomm for medical Rx.  Patient is doing fairly well.  I have asked her to take Imdur in AM (she is taking later in afternooon afiter she has had CP) Can increase dose.if needed.   Stay active  2.  HL  Keep on statin.  3.  HTN  Adequate control  4.  Pain control.  Patient with DJD and fibromyalgia.  On med for muscle spasms  Will call in with name.  Wants to know what pain med is safe.  Will review with pharmacy. Marland Kitchen

## 2012-08-12 ENCOUNTER — Telehealth: Payer: Self-pay | Admitting: Internal Medicine

## 2012-08-12 ENCOUNTER — Encounter: Payer: Self-pay | Admitting: Gynecology

## 2012-08-12 NOTE — Telephone Encounter (Signed)
New msg Pt wants to disuss cath with you. Please call

## 2012-08-12 NOTE — Telephone Encounter (Signed)
Called patient back. She wanted me to mail a copy of her lab work from yesterday and her cath report to her home. I reviewed her lab work but advised that Dr.Ross had not reviewed her labs yet. Will call back if there are any medication changes. She wanted some medication for " her nerves". Advised her to obtain this from her primary care doctor.

## 2012-08-13 ENCOUNTER — Other Ambulatory Visit: Payer: Self-pay | Admitting: Cardiology

## 2012-08-13 MED ORDER — CALCITRIOL 0.25 MCG PO CAPS: 0.5000 ug | ORAL_CAPSULE | Freq: Two times a day (BID) | ORAL | Status: DC

## 2012-08-15 ENCOUNTER — Other Ambulatory Visit: Payer: Self-pay | Admitting: *Deleted

## 2012-08-15 MED ORDER — CALCITRIOL 0.25 MCG PO CAPS: 0.5000 ug | ORAL_CAPSULE | Freq: Two times a day (BID) | ORAL | Status: DC

## 2012-08-15 NOTE — Telephone Encounter (Signed)
Fax Received. Refill Completed. Ziyon Cedotal Chowoe (R.M.A)   

## 2012-08-22 ENCOUNTER — Telehealth: Payer: Self-pay | Admitting: Internal Medicine

## 2012-08-22 NOTE — Telephone Encounter (Signed)
Pt request a copy of there film from her stress test and catherization.  She can be reached at [phone]

## 2012-08-22 NOTE — Telephone Encounter (Signed)
LMOM for call back. 

## 2012-09-01 NOTE — Telephone Encounter (Signed)
Called patient and left message that I have arranged for her to pick up a copy of her heart cath from Dr.Stuckey at [facility] at the main check in desk. She needs to sign a release when she gets there tomorrow to pick up CD.

## 2012-09-01 NOTE — Telephone Encounter (Signed)
Left message on cell phone for call back.

## 2012-09-01 NOTE — Telephone Encounter (Signed)
Pt rtn your call

## 2012-09-02 NOTE — Telephone Encounter (Signed)
Called patient and she is aware to pick up heart cath report CD disc at [facility]. Needs OK for orthopod surgery. Still awaiting fax.

## 2012-09-02 NOTE — Telephone Encounter (Signed)
F/u   Patient would like nurse to give her a call ASAP [phone]

## 2012-09-03 NOTE — Telephone Encounter (Signed)
I do not understand what she is asking for  What surgery?   Is she seeking a second opinion, looking for cath films.

## 2012-09-03 NOTE — Telephone Encounter (Signed)
F/u  Returning call back to nurse.  

## 2012-09-03 NOTE — Telephone Encounter (Signed)
Pt needs cardiac clearance as noted prior, will forward, Dr/nurse here tomorrow to address.

## 2012-09-04 NOTE — Telephone Encounter (Signed)
Cath film is for her brother in law to review who is an MD. Patient is considering a Left knee medial menisectomy with Dr.Steven Ranell Patrick. Reviewed with Dr.Ross. She advised that patient may have surgery AFTER 11/19/2012 because of need to stay on PLAVIX. I left a message with the patient with the above information and faxed the pre op clearance to Dr.Norris at [facility] with the above date of restriction to [phone].

## 2012-09-15 ENCOUNTER — Telehealth: Payer: Self-pay | Admitting: Internal Medicine

## 2012-09-15 NOTE — Telephone Encounter (Signed)
Pt requesting call from [facility] tomorrow

## 2012-09-17 NOTE — Telephone Encounter (Signed)
Patient called no answer.LMTC. 

## 2012-09-22 ENCOUNTER — Ambulatory Visit: Payer: 59 | Admitting: Internal Medicine

## 2012-09-29 ENCOUNTER — Ambulatory Visit: Payer: 59 | Admitting: Internal Medicine

## 2012-10-30 ENCOUNTER — Telehealth: Payer: Self-pay | Admitting: Internal Medicine

## 2012-10-30 NOTE — Telephone Encounter (Signed)
New Problem: ° ° ° °Patient called in wanting to speak with you about her medications.  Please call back. °

## 2012-10-30 NOTE — Telephone Encounter (Signed)
LMOM for call back. 

## 2012-10-31 ENCOUNTER — Encounter: Payer: Self-pay | Admitting: Internal Medicine

## 2012-10-31 ENCOUNTER — Ambulatory Visit (INDEPENDENT_AMBULATORY_CARE_PROVIDER_SITE_OTHER): Payer: 59 | Admitting: Internal Medicine

## 2012-10-31 VITALS — BP 128/72 | HR 66 | Temp 98.2°F | Resp 16 | Wt 188.0 lb

## 2012-10-31 DIAGNOSIS — Z1239 Encounter for other screening for malignant neoplasm of breast: Secondary | ICD-10-CM

## 2012-10-31 DIAGNOSIS — K219 Gastro-esophageal reflux disease without esophagitis: Secondary | ICD-10-CM

## 2012-10-31 DIAGNOSIS — G44209 Tension-type headache, unspecified, not intractable: Secondary | ICD-10-CM

## 2012-10-31 MED ORDER — CYCLOBENZAPRINE HCL 5 MG PO TABS: 5.0000 mg | ORAL_TABLET | Freq: Every evening | ORAL | Status: DC | PRN

## 2012-10-31 MED ORDER — ISOSORBIDE MONONITRATE ER 30 MG PO TB24: 30.0000 mg | ORAL_TABLET | Freq: Every day | ORAL | Status: DC

## 2012-10-31 MED ORDER — PANTOPRAZOLE SODIUM 40 MG PO TBEC: 40.0000 mg | DELAYED_RELEASE_TABLET | Freq: Every day | ORAL | Status: DC

## 2012-10-31 NOTE — Progress Notes (Signed)
  Subjective:    Patient ID: [name], female    DOB: October 04, 1946, 66 y.o.   MRN: [id number]  HPIPt presents to clinic for followup of multiple medical problems. Notes intermittent ha's often beginning in posterior neck area. Taking no medication for the problem. Has nighttime intermittent regurgitation of food. Worse when lying flat. H/o CAD taking plavix.  Past Medical History  Diagnosis Date  . GERD (gastroesophageal reflux disease)     occasional  . Hyperlipidemia   . Hypertension     Echocardiogram 11/19/11: Difficult acoustic windows, EF 60-65%, normal LV wall thickness, grade 1 diastolic dysfunction  . Vertigo   . Tubular adenoma of colon 06/2011  . Colitis   . Multinodular thyroid     Goiter s/p thyroidectomy in 2007 with post-op  hypocalcemia and post-op hypothyroidism/hypoparathyroidism with hypocalcemia  . CAD (coronary artery disease)     a) NSTEMI 10/2011: LHC 11/19/11: pLAD 30%, oOM 60%, AVCFX 30%, CFX after OM2 30%, pRCA 60 and 70%, then 99%, AM 80-90% with TIMI 3 flow.  PCI: Promus DES x 2 to RCA.  Marland Kitchen Post-surgical hypothyroidism   . Hypoparathyroidism   . Palpitations     a) 03/2012 - patient set up for event monitor but did not wear correctly -  she declined wearing a repeat monitor   Past Surgical History  Procedure Date  . Colonoscopy Aenomatous polyps    07/05/2011  . Total thyroidectomy 2007    GOITER  . Shoulder arthroscopy w/ rotator cuff repair     right    reports that she has never smoked. She has never used smokeless tobacco. She reports that she does not drink alcohol or use illicit drugs. family history includes Cancer in her brother; Colon cancer in her brother; Heart disease in her father; and Hypertension in her father.  There is no history of Diabetes, and Prostate cancer, and Breast cancer, . No Known Allergies    Review of Systems see hpi     Objective:   Physical Exam  Physical Exam  Nursing note and vitals  reviewed. Constitutional: Appears well-developed and well-nourished. No distress.  HENT:  Head: Normocephalic and atraumatic.  Right Ear: External ear normal.  Left Ear: External ear normal.  Eyes: Conjunctivae are normal. No scleral icterus.  Neck: Neck supple. Carotid bruit is not present.  Cardiovascular: Normal rate, regular rhythm and normal heart sounds.  Exam reveals no gallop and no friction rub.   No murmur heard. Pulmonary/Chest: Effort normal and breath sounds normal. No respiratory distress. He has no wheezes. no rales.  Lymphadenopathy:    He has no cervical adenopathy.  Neurological:Alert.  Skin: Skin is warm and dry. Not diaphoretic.  Psychiatric: Has a normal mood and affect.        Assessment & Plan:

## 2012-10-31 NOTE — Telephone Encounter (Signed)
Samples of Crestor given to her husband.

## 2012-10-31 NOTE — Patient Instructions (Addendum)
You may try over the counter B complex vitamin or biotin for your nails.

## 2012-11-02 DIAGNOSIS — G44209 Tension-type headache, unspecified, not intractable: Secondary | ICD-10-CM | POA: Insufficient documentation

## 2012-11-02 DIAGNOSIS — K219 Gastro-esophageal reflux disease without esophagitis: Secondary | ICD-10-CM | POA: Insufficient documentation

## 2012-11-02 NOTE — Assessment & Plan Note (Signed)
Begin protonix. F/u 3-4 wks. If sx's not controlled at that point consider gi consult

## 2012-11-02 NOTE — Assessment & Plan Note (Signed)
Attempt flexeril qhs prn. Followup if no improvement or worsening.

## 2012-11-22 ENCOUNTER — Encounter (HOSPITAL_COMMUNITY): Payer: Self-pay | Admitting: *Deleted

## 2012-11-22 ENCOUNTER — Emergency Department (HOSPITAL_COMMUNITY)
Admission: EM | Admit: 2012-11-22 | Discharge: 2012-11-23 | Disposition: A | Discharge status: Home / Self Care | Payer: 59 | Attending: Emergency Medicine | Admitting: Emergency Medicine

## 2012-11-22 ENCOUNTER — Emergency Department (HOSPITAL_COMMUNITY): Payer: 59

## 2012-11-22 DIAGNOSIS — Z8719 Personal history of other diseases of the digestive system: Secondary | ICD-10-CM | POA: Insufficient documentation

## 2012-11-22 DIAGNOSIS — I2510 Atherosclerotic heart disease of native coronary artery without angina pectoris: Secondary | ICD-10-CM

## 2012-11-22 DIAGNOSIS — R079 Chest pain, unspecified: Secondary | ICD-10-CM

## 2012-11-22 DIAGNOSIS — Z8639 Personal history of other endocrine, nutritional and metabolic disease: Secondary | ICD-10-CM | POA: Insufficient documentation

## 2012-11-22 DIAGNOSIS — R0789 Other chest pain: Secondary | ICD-10-CM | POA: Insufficient documentation

## 2012-11-22 DIAGNOSIS — Z862 Personal history of diseases of the blood and blood-forming organs and certain disorders involving the immune mechanism: Secondary | ICD-10-CM | POA: Insufficient documentation

## 2012-11-22 DIAGNOSIS — Z79899 Other long term (current) drug therapy: Secondary | ICD-10-CM | POA: Insufficient documentation

## 2012-11-22 DIAGNOSIS — E039 Hypothyroidism, unspecified: Secondary | ICD-10-CM | POA: Insufficient documentation

## 2012-11-22 DIAGNOSIS — E785 Hyperlipidemia, unspecified: Secondary | ICD-10-CM | POA: Insufficient documentation

## 2012-11-22 DIAGNOSIS — I10 Essential (primary) hypertension: Secondary | ICD-10-CM | POA: Insufficient documentation

## 2012-11-22 LAB — CBC WITH DIFFERENTIAL/PLATELET
Basophils Relative: 0 % (ref 0–1)
Eosinophils Absolute: 0.3 10*3/uL (ref 0.0–0.7)
Eosinophils Relative: 2 % (ref 0–5)
Lymphs Abs: 3.2 10*3/uL (ref 0.7–4.0)
MCH: 28.5 pg (ref 26.0–34.0)
MCHC: 33.6 g/dL (ref 30.0–36.0)
MCV: 84.7 fL (ref 78.0–100.0)
Neutrophils Relative %: 62 % (ref 43–77)
Platelets: 236 10*3/uL (ref 150–400)
RBC: 4.71 MIL/uL (ref 3.87–5.11)

## 2012-11-22 LAB — POCT I-STAT TROPONIN I: Troponin i, poc: 0.01 ng/mL (ref 0.00–0.08)

## 2012-11-22 LAB — COMPREHENSIVE METABOLIC PANEL
ALT: 17 U/L (ref 0–35)
AST: 17 U/L (ref 0–37)
Albumin: 3.6 g/dL (ref 3.5–5.2)
Alkaline Phosphatase: 95 U/L (ref 39–117)
CO2: 29 mEq/L (ref 19–32)
Chloride: 101 mEq/L (ref 96–112)
Creatinine, Ser: 0.99 mg/dL (ref 0.50–1.10)
GFR calc non Af Amer: 59 mL/min — ABNORMAL LOW (ref >90)
Potassium: 3.5 mEq/L (ref 3.5–5.1)
Sodium: 141 mEq/L (ref 135–145)
Total Bilirubin: 0.2 mg/dL — ABNORMAL LOW (ref 0.3–1.2)

## 2012-11-22 MED ORDER — ASPIRIN 81 MG PO CHEW
162.0000 mg | CHEWABLE_TABLET | Freq: Once | ORAL | Status: AC
  Administered 2012-11-22: 162 mg via ORAL
  Filled 2012-11-22: qty 2

## 2012-11-22 MED ORDER — ASPIRIN 81 MG PO CHEW: 324.0000 mg | CHEWABLE_TABLET | Freq: Once | ORAL | Status: DC

## 2012-11-22 NOTE — ED Notes (Signed)
C/o CP, onset ~ 2 hr ago, took 2 ASA and 2 NTG SL PTA, "felt better after ntg", cardiac & GERD hx. Family in w/r.

## 2012-11-22 NOTE — ED Notes (Signed)
MD at bedside. 

## 2012-11-23 LAB — POCT I-STAT TROPONIN I: Troponin i, poc: 0 ng/mL (ref 0.00–0.08)

## 2012-11-23 NOTE — ED Notes (Signed)
The patient is AOx4 and comfortable with her discharge instructions. 

## 2012-11-23 NOTE — ED Provider Notes (Signed)
History     CSN: [id number]  Arrival date & time 11/22/12  2223   First MD Initiated Contact with Patient 11/22/12 2311      Chief Complaint  Patient presents with  . Chest Pain    (Consider location/radiation/quality/duration/timing/severity/associated sxs/prior treatment) HPI Comments: Mrs. Bechtol presents with her husband for evaluation of chest pain.  She states she was sitting watching television when she experienced the sudden onset of chest discomfort.  Ift felt like a dull pressure and burning.  She took 2 aspirin and 2 SL ntg.  She is unsure of the nature of the discomfort because it felt like indigestion or GERD and was completely resolved after burping.  She denies any other symptoms and states she is completely pain free now.  Patient is a 66 y.o. female presenting with chest pain.  Chest Pain Pertinent negatives for primary symptoms include no fever, no fatigue, no shortness of breath, no cough, no palpitations, no abdominal pain, no nausea and no vomiting.  Pertinent negatives for associated symptoms include no diaphoresis and no weakness.     Past Medical History  Diagnosis Date  . GERD (gastroesophageal reflux disease)     occasional  . Hyperlipidemia   . Hypertension     Echocardiogram 11/19/11: Difficult acoustic windows, EF 60-65%, normal LV wall thickness, grade 1 diastolic dysfunction  . Vertigo   . Tubular adenoma of colon 06/2011  . Colitis   . Multinodular thyroid     Goiter s/p thyroidectomy in 2007 with post-op  hypocalcemia and post-op hypothyroidism/hypoparathyroidism with hypocalcemia  . CAD (coronary artery disease)     a) NSTEMI 10/2011: LHC 11/19/11: pLAD 30%, oOM 60%, AVCFX 30%, CFX after OM2 30%, pRCA 60 and 70%, then 99%, AM 80-90% with TIMI 3 flow.  PCI: Promus DES x 2 to RCA.  Marland Kitchen Post-surgical hypothyroidism   . Hypoparathyroidism   . Palpitations     a) 03/2012 - patient set up for event monitor but did not wear correctly -  she declined  wearing a repeat monitor    Past Surgical History  Procedure Date  . Colonoscopy Aenomatous polyps    07/05/2011  . Total thyroidectomy 2007    GOITER  . Shoulder arthroscopy w/ rotator cuff repair     right    Family History  Problem Relation Age of Onset  . Colon cancer Brother   . Cancer Brother     COLON  . Hypertension Father   . Diabetes Neg Hx   . Prostate cancer Neg Hx   . Heart disease Father   . Breast cancer Neg Hx     History  Substance Use Topics  . Smoking status: Never Smoker   . Smokeless tobacco: Never Used  . Alcohol Use: No    OB History    Grav Para Term Preterm Abortions TAB SAB Ect Mult Living   4 4 4       4       Review of Systems  Constitutional: Negative for fever, chills, diaphoresis, activity change, appetite change and fatigue.  HENT: Negative for congestion, sore throat, rhinorrhea, trouble swallowing and neck pain.   Eyes: Negative.   Respiratory: Negative for cough, chest tightness and shortness of breath.   Cardiovascular: Positive for chest pain. Negative for palpitations and leg swelling.  Gastrointestinal: Negative for nausea, vomiting, abdominal pain, diarrhea, constipation and abdominal distention.  Genitourinary: Negative.   Musculoskeletal: Negative.   Skin: Negative for color change, pallor, rash and wound.  Neurological: Negative for syncope, weakness, light-headedness and headaches.  Psychiatric/Behavioral: Negative.     Allergies  Review of patient's allergies indicates no known allergies.  Home Medications   Current Outpatient Rx  Name  Route  Sig  Dispense  Refill  . AMLODIPINE BESYLATE 10 MG PO TABS   Oral   Take 1 tablet (10 mg total) by mouth daily.   90 tablet   3     DO NOT SEND ORDER TILL SHE CALLS FOR REORDER.   . ASPIRIN EC 81 MG PO TBEC   Oral   Take 81 mg by mouth daily.           Marland Kitchen CALCITRIOL 0.25 MCG PO CAPS   Oral   Take 0.5 mcg by mouth 2 (two) times daily.         Marland Kitchen CALCIUM  CARBONATE ANTACID 500 MG PO CHEW   Oral   Chew 4 tablets by mouth 2 (two) times daily.          Marland Kitchen CLOPIDOGREL BISULFATE 75 MG PO TABS   Oral   Take 1 tablet (75 mg total) by mouth daily with breakfast.   90 tablet   3   . CYANOCOBALAMIN PO   Oral   Take 1 tablet by mouth daily.         . CYCLOBENZAPRINE HCL 5 MG PO TABS   Oral   Take 5 mg by mouth at bedtime as needed. For muscle spasms         . FAMOTIDINE 20 MG PO TABS   Oral   Take 20 mg by mouth 2 (two) times daily.         . ISOSORBIDE MONONITRATE ER 30 MG PO TB24   Oral   Take 30 mg by mouth daily.         Marland Kitchen LEVOTHYROXINE SODIUM 125 MCG PO TABS   Oral   Take 125 mcg by mouth daily.         Marland Kitchen NITROGLYCERIN 0.4 MG SL SUBL   Sublingual   Place 0.4 mg under the tongue every 5 (five) minutes as needed. For chest pain         . ROSUVASTATIN CALCIUM 10 MG PO TABS   Oral   Take 1 tablet (10 mg total) by mouth daily.   90 tablet   3   . VITAMIN D 400 UNITS PO TABS   Oral   Take 1 tablet (400 Units total) by mouth 2 (two) times daily.   60 tablet   1     BP 114/53  Pulse 65  Temp 97.7 F (36.5 C) (Oral)  Resp 15  Ht 5\' 5"  (1.651 m)  Wt 185 lb (83.915 kg)  BMI 30.79 kg/m2  SpO2 98%  Physical Exam  Nursing note and vitals reviewed. Constitutional: She is oriented to person, place, and time. She appears well-developed and well-nourished. No distress.  HENT:  Head: Normocephalic and atraumatic.  Right Ear: External ear normal.  Left Ear: External ear normal.  Nose: Nose normal.  Mouth/Throat: Oropharynx is clear and moist.  Eyes: Conjunctivae normal are normal. Pupils are equal, round, and reactive to light. Right eye exhibits no discharge. Left eye exhibits no discharge. No scleral icterus.  Neck: Normal range of motion. Neck supple. No JVD present. No tracheal deviation present.  Cardiovascular: Normal rate, regular rhythm, S1 normal, S2 normal, normal heart sounds, intact distal pulses and  normal pulses.   No extrasystoles are present. PMI is  not displaced.  Exam reveals no gallop, no friction rub and no decreased pulses.   No murmur heard. Pulmonary/Chest: Effort normal and breath sounds normal. No stridor. No respiratory distress. She has no wheezes. She has no rales. She exhibits no tenderness.  Abdominal: Soft. Bowel sounds are normal. She exhibits no distension and no mass. There is no tenderness. There is no rebound and no guarding.  Musculoskeletal: Normal range of motion. She exhibits no edema and no tenderness.  Lymphadenopathy:    She has no cervical adenopathy.  Neurological: She is alert and oriented to person, place, and time. No cranial nerve deficit.  Skin: Skin is warm and dry. No rash noted. She is not diaphoretic. No erythema. No pallor.  Psychiatric: She has a normal mood and affect. Her behavior is normal.    ED Course  Procedures (including critical care time)  Labs Reviewed  COMPREHENSIVE METABOLIC PANEL - Abnormal; Notable for the following:    Glucose, Bld 106 (*)     BUN 25 (*)     Calcium 8.1 (*)     Total Bilirubin 0.2 (*)     GFR calc non Af Amer 59 (*)     GFR calc Af Amer 68 (*)     All other components within normal limits  CBC WITH DIFFERENTIAL - Abnormal; Notable for the following:    WBC 11.6 (*)     All other components within normal limits  POCT I-STAT TROPONIN I   Dg Chest Portable 1 View  11/22/2012  *RADIOLOGY REPORT*  Clinical Data: Chest pain.  PORTABLE CHEST - 1 VIEW  Comparison: Chest radiograph performed 06/05/2012  Findings: The lungs are mildly less well expanded.  Minimal bilateral opacities likely reflect atelectasis.  No pleural effusion or pneumothorax is identified.  The cardiomediastinal silhouette is within normal limits.  No acute osseous abnormalities are seen.  IMPRESSION: Lungs mildly hypoexpanded, with mild bibasilar atelectasis.   Original Report Authenticated By: Tonia Ghent, M.D.      No diagnosis  found.   Date: 11/23/2012  Rate: 68 bpm  Rhythm: normal sinus rhythm  QRS Axis: normal  Intervals: normal  ST/T Wave abnormalities: normal  Conduction Disutrbances:none  Narrative Interpretation:   Old EKG Reviewed: unchanged      MDM  Pt with known hx of CAD presents for evaluation of chest discomfort.  She is currently pain free, note stable VS, NAD.  Her EKG demonstrates no evidence of acute ischemia and 1st trop is negative.  She reports the pain was resolved after receiving aspirin and nitroglycerin SL x1.  Will repeat trop in 3 hours.  If she has any further pain will seek admission for further evaluation.  If she remains pain free and second trop is negative, will discharge home to follow-up with her cardiologist as an outpt.  0225.  Pt stable, resting.  Pain free.  2n trop is negative.  Plan discharge home as noted above.  She has been instructed to return to the ER for evaluation and likely admission for any further chest discomfort.      Tobin Chad, MD 11/23/12 [phone]

## 2012-11-26 ENCOUNTER — Telehealth: Payer: Self-pay | Admitting: *Deleted

## 2012-11-26 ENCOUNTER — Ambulatory Visit
Admission: RE | Admit: 2012-11-26 | Discharge: 2012-11-26 | Disposition: A | Discharge status: Home / Self Care | Payer: 59 | Source: Ambulatory Visit | Attending: Internal Medicine | Admitting: Internal Medicine

## 2012-11-26 DIAGNOSIS — Z1239 Encounter for other screening for malignant neoplasm of breast: Secondary | ICD-10-CM

## 2012-11-26 NOTE — Telephone Encounter (Addendum)
Patient had called about her bill from the nuc. Stress test that she had done back in July. She stated that it was not covered because of incorrect diagnosis. I called our billing department and spoke with Marcelino Duster and she advised me that [facility] paid their portion but the Medicare part was not paid because of lack of coverage at the time which is probably an error. Called patient and left message that she needs to call the phone # on the bill to get this cleared up. The one billing phone # is [phone] or Medicare [phone]. Advised her to call me back to discuss.

## 2012-11-28 NOTE — Telephone Encounter (Signed)
Patient aware of above. She threw out the bill,so does not have a copy presently. Will let us know if she still has a problem and show [place] a copy so we can get this issue resolved.

## 2012-12-10 ENCOUNTER — Ambulatory Visit: Payer: Medicare Other | Admitting: Internal Medicine

## 2012-12-15 ENCOUNTER — Telehealth: Payer: Self-pay | Admitting: Internal Medicine

## 2012-12-15 NOTE — Telephone Encounter (Signed)
Pt calling re paper she gave the girls up front

## 2012-12-15 NOTE — Telephone Encounter (Signed)
Called patient and advised that [employer] has paperwork from [facility] as of 12/21 and it takes 7 to 14 days to process.

## 2012-12-26 ENCOUNTER — Telehealth: Payer: Self-pay | Admitting: Internal Medicine

## 2012-12-26 NOTE — Telephone Encounter (Signed)
Pt wants a call from [place] re her medication

## 2012-12-26 NOTE — Telephone Encounter (Signed)
Called patient back. She had an appointment with Tereso Newcomer Pa next week for chest pain but she does not want to see him. Only wants to see Dr.Ross. Cancelled appointment with Tereso Newcomer per her request. Appointment set for 1/13 with Dr.Ross She has been taking NTG prn. Needs form completed for work with restriction of not working at night. She will bring form for [place] to complete. Samples of Crestor 10mg  left for her to pick up at the front desk.

## 2012-12-27 ENCOUNTER — Other Ambulatory Visit: Payer: Self-pay | Admitting: Physician Assistant

## 2012-12-31 ENCOUNTER — Ambulatory Visit: Payer: 59 | Admitting: Physician Assistant

## 2013-01-01 ENCOUNTER — Telehealth: Payer: Self-pay | Admitting: *Deleted

## 2013-01-01 NOTE — Telephone Encounter (Signed)
Called patient to advise her that the form for [employer] work restriction is completed. She would like to pick it up on Monday at MD visit.

## 2013-01-05 ENCOUNTER — Encounter: Payer: Self-pay | Admitting: Internal Medicine

## 2013-01-05 ENCOUNTER — Ambulatory Visit (INDEPENDENT_AMBULATORY_CARE_PROVIDER_SITE_OTHER): Payer: 59 | Admitting: Internal Medicine

## 2013-01-05 VITALS — BP 130/75 | HR 60 | Ht 65.0 in | Wt 189.0 lb

## 2013-01-05 DIAGNOSIS — I2510 Atherosclerotic heart disease of native coronary artery without angina pectoris: Secondary | ICD-10-CM

## 2013-01-05 MED ORDER — CLOPIDOGREL BISULFATE 75 MG PO TABS: 75.0000 mg | ORAL_TABLET | Freq: Every day | ORAL | Status: DC

## 2013-01-05 MED ORDER — ROSUVASTATIN CALCIUM 10 MG PO TABS: 10.0000 mg | ORAL_TABLET | Freq: Every day | ORAL | Status: DC

## 2013-01-05 MED ORDER — NITROGLYCERIN 0.4 MG SL SUBL: 0.4000 mg | SUBLINGUAL_TABLET | SUBLINGUAL | Status: DC | PRN

## 2013-01-05 MED ORDER — ISOSORBIDE MONONITRATE ER 60 MG PO TB24: 60.0000 mg | ORAL_TABLET | Freq: Every day | ORAL | Status: DC

## 2013-01-05 MED ORDER — AMLODIPINE BESYLATE 10 MG PO TABS: 10.0000 mg | ORAL_TABLET | Freq: Every day | ORAL | Status: DC

## 2013-01-05 NOTE — Progress Notes (Signed)
HPI Patient is a 67 yo with a history of CAD  Underwent LHC on 7/25  Patent RCA stent  LCA with collaterals to R acute marginal were unchanged.  Subtotal occlusion of acute marginal (exits mid stent).  Recomm for medical Rx I saw her in clinic in August  Tikd her to take imdur in AM  SInce then she continues to have intermitt CP  Occurs every couple days.  Relieved with 1 or 2 NTG  Occur at work only  She denies at home  Wonders if some may be GI   No Known Allergies  Current Outpatient Prescriptions  Medication Sig Dispense Refill  . amLODipine (NORVASC) 10 MG tablet Take 1 tablet (10 mg total) by mouth daily.  90 tablet  3  . aspirin EC 81 MG tablet Take 81 mg by mouth daily.        . calcitRIOL (ROCALTROL) 0.25 MCG capsule Take 0.5 mcg by mouth 2 (two) times daily.      . calcium carbonate (TUMS - DOSED IN MG ELEMENTAL CALCIUM) 500 MG chewable tablet Chew 4 tablets by mouth 2 (two) times daily.       . clopidogrel (PLAVIX) 75 MG tablet Take 1 tablet (75 mg total) by mouth daily with breakfast.  90 tablet  3  . CYANOCOBALAMIN PO Take 1 tablet by mouth daily.      . cyclobenzaprine (FLEXERIL) 5 MG tablet Take 5 mg by mouth at bedtime as needed. For muscle spasms      . famotidine (PEPCID) 20 MG tablet Take 20 mg by mouth 2 (two) times daily.      . isosorbide mononitrate (IMDUR) 30 MG 24 hr tablet Take 30 mg by mouth daily.      Marland Kitchen levothyroxine (SYNTHROID, LEVOTHROID) 125 MCG tablet Take 125 mcg by mouth daily.      Marland Kitchen NITROSTAT 0.4 MG SL tablet PLACE 1 TABLET UNDER THE TONGUE EVERY 5 MINUTES AS NEEDED FOR CHEST PAIN (UP TO 3 DOSES)  25 tablet  1  . rosuvastatin (CRESTOR) 10 MG tablet Take 1 tablet (10 mg total) by mouth daily.  90 tablet  3  . vitamin D, CHOLECALCIFEROL, 400 UNITS tablet Take 1 tablet (400 Units total) by mouth 2 (two) times daily.  60 tablet  1    Past Medical History  Diagnosis Date  . GERD (gastroesophageal reflux disease)     occasional  . Hyperlipidemia   .  Hypertension     Echocardiogram 11/19/11: Difficult acoustic windows, EF 60-65%, normal LV wall thickness, grade 1 diastolic dysfunction  . Vertigo   . Tubular adenoma of colon 06/2011  . Colitis   . Multinodular thyroid     Goiter s/p thyroidectomy in 2007 with post-op  hypocalcemia and post-op hypothyroidism/hypoparathyroidism with hypocalcemia  . CAD (coronary artery disease)     a) NSTEMI 10/2011: LHC 11/19/11: pLAD 30%, oOM 60%, AVCFX 30%, CFX after OM2 30%, pRCA 60 and 70%, then 99%, AM 80-90% with TIMI 3 flow.  PCI: Promus DES x 2 to RCA.  Marland Kitchen Post-surgical hypothyroidism   . Hypoparathyroidism   . Palpitations     a) 03/2012 - patient set up for event monitor but did not wear correctly -  she declined wearing a repeat monitor    Past Surgical History  Procedure Date  . Colonoscopy Aenomatous polyps    07/05/2011  . Total thyroidectomy 2007    GOITER  . Shoulder arthroscopy w/ rotator cuff repair  right    Family History  Problem Relation Age of Onset  . Colon cancer Brother   . Cancer Brother     COLON  . Hypertension Father   . Diabetes Neg Hx   . Prostate cancer Neg Hx   . Heart disease Father   . Breast cancer Neg Hx     History   Social History  . Marital Status: Single    Spouse Name: N/A    Number of Children: N/A  . Years of Education: N/A   Occupational History  . Not on file.   Social History Main Topics  . Smoking status: Never Smoker   . Smokeless tobacco: Never Used  . Alcohol Use: No  . Drug Use: No  . Sexually Active: Yes   Other Topics Concern  . Not on file   Social History Narrative  . No narrative on file    Review of Systems:  All systems reviewed.  They are negative to the above problem except as previously stated.  Vital Signs: BP 130/75  Pulse 60  Ht 5\' 5"  (1.651 m)  Wt 189 lb (85.73 kg)  BMI 31.45 kg/m2  Physical Exam Patient is in NAD HEENT:  Normocephalic, atraumatic. EOMI, PERRLA.  Neck: JVP is normal.  No  bruits.  Lungs: clear to auscultation. No rales no wheezes.  Heart: Regular rate and rhythm. Normal S1, S2. No S3.   No significant murmurs. PMI not displaced.  Abdomen:  Supple, nontender. Normal bowel sounds. No masses. No hepatomegaly.  Extremities:   Good distal pulses throughout. No lower extremity edema.  Musculoskeletal :moving all extremities.  Neuro:   alert and oriented x3.  CN II-XII grossly intact.  EKG  SR 60   Assessment and Plan:  1.  CAD Angina appears stable  Again appears to occur most when she is at work.  Stress related.   I have told her to take activities at her own pace.  She has NTG to take  If at 3 NTG she still has CP she should look for help.  I would keep on Plavix given location of stents to AM and the fact that they are overlapping  2.  HL  Last lipid panel LDL was 83.  At her peak LDL was around 240.  Continue crestor

## 2013-01-05 NOTE — Patient Instructions (Signed)
Increase Isosorbide to 60 mg every day  Your physician wants you to follow-up in: AUGUST 2014 You will receive a reminder letter in the mail two months in advance. If you don't receive a letter, please call our office to schedule the follow-up appointment.

## 2013-01-23 ENCOUNTER — Telehealth: Payer: Self-pay | Admitting: Internal Medicine

## 2013-01-26 ENCOUNTER — Telehealth: Payer: Self-pay | Admitting: Internal Medicine

## 2013-01-26 NOTE — Telephone Encounter (Signed)
New Problem:    Patient called in returning your call and would like a call back as soon as possible because it is urgent.  Please call back.

## 2013-01-26 NOTE — Telephone Encounter (Signed)
Work restriction form completed and mailed to patient's home address at [facility] [address]. Patient aware of above.

## 2013-01-26 NOTE — Telephone Encounter (Signed)
Left message for call back.

## 2013-04-27 ENCOUNTER — Other Ambulatory Visit: Payer: Self-pay | Admitting: *Deleted

## 2013-04-27 ENCOUNTER — Telehealth: Payer: Self-pay | Admitting: *Deleted

## 2013-04-27 MED ORDER — ISOSORBIDE MONONITRATE ER 60 MG PO TB24: 60.0000 mg | ORAL_TABLET | Freq: Every day | ORAL | Status: DC

## 2013-04-27 MED ORDER — LEVOTHYROXINE SODIUM 125 MCG PO TABS: 125.0000 ug | ORAL_TABLET | Freq: Every day | ORAL | Status: DC

## 2013-04-27 MED ORDER — AMLODIPINE BESYLATE 10 MG PO TABS: 10.0000 mg | ORAL_TABLET | Freq: Every day | ORAL | Status: DC

## 2013-04-27 MED ORDER — NITROGLYCERIN 0.4 MG SL SUBL: 0.4000 mg | SUBLINGUAL_TABLET | SUBLINGUAL | Status: DC | PRN

## 2013-04-27 MED ORDER — CALCITRIOL 0.25 MCG PO CAPS: 0.5000 ug | ORAL_CAPSULE | Freq: Two times a day (BID) | ORAL | Status: DC

## 2013-04-27 MED ORDER — CLOPIDOGREL BISULFATE 75 MG PO TABS: 75.0000 mg | ORAL_TABLET | Freq: Every day | ORAL | Status: DC

## 2013-04-27 NOTE — Telephone Encounter (Signed)
Patient left message on refill line for a call back about medications. Was unable to reach patient. Will forward to Dr [name] nurse for further assistance.

## 2013-04-29 ENCOUNTER — Other Ambulatory Visit: Payer: Self-pay | Admitting: *Deleted

## 2013-04-29 MED ORDER — ISOSORBIDE MONONITRATE ER 60 MG PO TB24: 60.0000 mg | ORAL_TABLET | Freq: Every day | ORAL | Status: DC

## 2013-04-29 MED ORDER — CALCITRIOL 0.25 MCG PO CAPS: 0.5000 ug | ORAL_CAPSULE | Freq: Two times a day (BID) | ORAL | Status: DC

## 2013-04-29 MED ORDER — AMLODIPINE BESYLATE 10 MG PO TABS: 10.0000 mg | ORAL_TABLET | Freq: Every day | ORAL | Status: DC

## 2013-04-29 MED ORDER — NITROGLYCERIN 0.4 MG SL SUBL: 0.4000 mg | SUBLINGUAL_TABLET | SUBLINGUAL | Status: DC | PRN

## 2013-04-29 MED ORDER — LEVOTHYROXINE SODIUM 125 MCG PO TABS: 125.0000 ug | ORAL_TABLET | Freq: Every day | ORAL | Status: DC

## 2013-04-29 MED ORDER — CLOPIDOGREL BISULFATE 75 MG PO TABS: 75.0000 mg | ORAL_TABLET | Freq: Every day | ORAL | Status: DC

## 2013-04-29 NOTE — Telephone Encounter (Signed)
Patient calling b/c [facility] says they never received the approvals and have been trying to fax and call us. I will resent the refill. Micki Riley, CMA

## 2013-05-28 ENCOUNTER — Encounter: Payer: Self-pay | Admitting: Family Medicine

## 2013-05-28 ENCOUNTER — Ambulatory Visit (INDEPENDENT_AMBULATORY_CARE_PROVIDER_SITE_OTHER): Payer: Medicare Other | Admitting: Family Medicine

## 2013-05-28 DIAGNOSIS — E785 Hyperlipidemia, unspecified: Secondary | ICD-10-CM

## 2013-05-28 DIAGNOSIS — K219 Gastro-esophageal reflux disease without esophagitis: Secondary | ICD-10-CM

## 2013-05-28 DIAGNOSIS — I25810 Atherosclerosis of coronary artery bypass graft(s) without angina pectoris: Secondary | ICD-10-CM

## 2013-05-28 DIAGNOSIS — E079 Disorder of thyroid, unspecified: Secondary | ICD-10-CM

## 2013-05-28 DIAGNOSIS — K5900 Constipation, unspecified: Secondary | ICD-10-CM

## 2013-05-28 DIAGNOSIS — I10 Essential (primary) hypertension: Secondary | ICD-10-CM

## 2013-05-28 DIAGNOSIS — M549 Dorsalgia, unspecified: Secondary | ICD-10-CM

## 2013-05-28 DIAGNOSIS — R351 Nocturia: Secondary | ICD-10-CM

## 2013-05-28 DIAGNOSIS — E039 Hypothyroidism, unspecified: Secondary | ICD-10-CM

## 2013-05-28 MED ORDER — METHOCARBAMOL 500 MG PO TABS: 500.0000 mg | ORAL_TABLET | Freq: Two times a day (BID) | ORAL | Status: DC | PRN

## 2013-05-28 NOTE — Patient Instructions (Addendum)
Next visit annual   Moist heat and gentle stretching Salon Pas patches and gel  Back Pain, Adult Low back pain is very common. About 1 in 5 people have back pain.The cause of low back pain is rarely dangerous. The pain often gets better over time.About half of people with a sudden onset of back pain feel better in just 2 weeks. About 8 in 10 people feel better by 6 weeks.  CAUSES Some common causes of back pain include:  Strain of the muscles or ligaments supporting the spine.  Wear and tear (degeneration) of the spinal discs.  Arthritis.  Direct injury to the back. DIAGNOSIS Most of the time, the direct cause of low back pain is not known.However, back pain can be treated effectively even when the exact cause of the pain is unknown.Answering your caregiver's questions about your overall health and symptoms is one of the most accurate ways to make sure the cause of your pain is not dangerous. If your caregiver needs more information, he or she may order lab work or imaging tests (X-rays or MRIs).However, even if imaging tests show changes in your back, this usually does not require surgery. HOME CARE INSTRUCTIONS For many people, back pain returns.Since low back pain is rarely dangerous, it is often a condition that people can learn to [facility] their own.   Remain active. It is stressful on the back to sit or stand in one place. Do not sit, drive, or stand in one place for more than 30 minutes at a time. Take short walks on level surfaces as soon as pain allows.Try to increase the length of time you walk each day.  Do not stay in bed.Resting more than 1 or 2 days can delay your recovery.  Do not avoid exercise or work.Your body is made to move.It is not dangerous to be active, even though your back may hurt.Your back will likely heal faster if you return to being active before your pain is gone.  Pay attention to your body when you bend and lift. Many people have less  discomfortwhen lifting if they bend their knees, keep the load close to their bodies,and avoid twisting. Often, the most comfortable positions are those that put less stress on your recovering back.  Find a comfortable position to sleep. Use a firm mattress and lie on your side with your knees slightly bent. If you lie on your back, put a pillow under your knees.  Only take over-the-counter or prescription medicines as directed by your caregiver. Over-the-counter medicines to reduce pain and inflammation are often the most helpful.Your caregiver may prescribe muscle relaxant drugs.These medicines help dull your pain so you can more quickly return to your normal activities and healthy exercise.  Put ice on the injured area.  Put ice in a plastic bag.  Place a towel between your skin and the bag.  Leave the ice on for 15-20 minutes, 3-4 times a day for the first 2 to 3 days. After that, ice and heat may be alternated to reduce pain and spasms.  Ask your caregiver about trying back exercises and gentle massage. This may be of some benefit.  Avoid feeling anxious or stressed.Stress increases muscle tension and can worsen back pain.It is important to recognize when you are anxious or stressed and learn ways to manage it.Exercise is a great option. SEEK MEDICAL CARE IF:  You have pain that is not relieved with rest or medicine.  You have pain that does not improve  in 1 week.  You have new symptoms.  You are generally not feeling well. SEEK IMMEDIATE MEDICAL CARE IF:   You have pain that radiates from your back into your legs.  You develop new bowel or bladder control problems.  You have unusual weakness or numbness in your arms or legs.  You develop nausea or vomiting.  You develop abdominal pain.  You feel faint. Document Released: 12/10/2005 Document Revised: 06/10/2012 Document Reviewed: 04/30/2011 [facility] Patient Information 2014 [place], [place].

## 2013-05-29 LAB — CBC
MCH: 29.1 pg (ref 26.0–34.0)
MCHC: 35.7 g/dL (ref 30.0–36.0)
MCV: 81.7 fL (ref 78.0–100.0)
Platelets: 279 10*3/uL (ref 150–400)
RDW: 15.1 % (ref 11.5–15.5)

## 2013-05-29 LAB — HEPATIC FUNCTION PANEL
AST: 17 U/L (ref 0–37)
Albumin: 4 g/dL (ref 3.5–5.2)
Total Bilirubin: 0.4 mg/dL (ref 0.3–1.2)

## 2013-05-29 LAB — URINALYSIS
Glucose, UA: NEGATIVE mg/dL
Ketones, ur: NEGATIVE mg/dL
Nitrite: NEGATIVE
Specific Gravity, Urine: 1.016 (ref 1.005–1.030)
pH: 7 (ref 5.0–8.0)

## 2013-05-29 LAB — LIPID PANEL
Cholesterol: 204 mg/dL — ABNORMAL HIGH (ref 0–200)
HDL: 46 mg/dL (ref >39)
Triglycerides: 149 mg/dL (ref <150)

## 2013-05-29 LAB — RENAL FUNCTION PANEL
Albumin: 4 g/dL (ref 3.5–5.2)
Calcium: 7.6 mg/dL — ABNORMAL LOW (ref 8.4–10.5)
Creat: 1.05 mg/dL (ref 0.50–1.10)
Phosphorus: 4.5 mg/dL (ref 2.3–4.6)
Sodium: 143 mEq/L (ref 135–145)

## 2013-05-29 LAB — TSH: TSH: 0.371 u[IU]/mL (ref 0.350–4.500)

## 2013-05-30 LAB — URINE CULTURE

## 2013-05-30 LAB — VITAMIN D 25 HYDROXY (VIT D DEFICIENCY, FRACTURES): Vit D, 25-Hydroxy: 46 ng/mL (ref 30–89)

## 2013-05-31 ENCOUNTER — Encounter: Payer: Self-pay | Admitting: Family Medicine

## 2013-05-31 DIAGNOSIS — K5909 Other constipation: Secondary | ICD-10-CM | POA: Insufficient documentation

## 2013-05-31 DIAGNOSIS — M549 Dorsalgia, unspecified: Secondary | ICD-10-CM | POA: Insufficient documentation

## 2013-05-31 DIAGNOSIS — R351 Nocturia: Secondary | ICD-10-CM

## 2013-05-31 DIAGNOSIS — K5900 Constipation, unspecified: Secondary | ICD-10-CM

## 2013-05-31 HISTORY — DX: Constipation, unspecified: K59.00

## 2013-05-31 HISTORY — DX: Nocturia: R35.1

## 2013-05-31 HISTORY — DX: Dorsalgia, unspecified: M54.9

## 2013-05-31 NOTE — Assessment & Plan Note (Signed)
Avoid offending foods, continue Pepcid and add Omeprazole

## 2013-05-31 NOTE — Assessment & Plan Note (Signed)
Well treated on current dose of meds 

## 2013-05-31 NOTE — Assessment & Plan Note (Signed)
Encouraged moist head and gentle stretching. Given muscle relaxer to use qhs and continue NSAIDs prn

## 2013-05-31 NOTE — Assessment & Plan Note (Signed)
Add probiotics and fiber, hydrate well

## 2013-05-31 NOTE — Progress Notes (Signed)
Patient ID: [name], female   DOB: July 08, 1946, 67 y.o.   MRN: [id number] [name] [id number] 1946-12-02 05/31/2013      Progress Note-Follow Up  Subjective  Chief Complaint  Chief Complaint  Patient presents with  . left waist pain    X 2 weeks- woke up and it feels better today    HPI  Patient is a 67 year old female who is in today complaining of back pain. Most notably in the left side. Thoracic spine. Denies any falls or injuries but it has been hurting for a couple of days. It is worse with position changes. Hurts the most when she lies down and twist she sits quietly it is not really painful. She's had some mild chills but no fevers or shortness of breath. No chest pain. No fevers urinary frequency or urinary or dysuria.  Past Medical History  Diagnosis Date  . GERD (gastroesophageal reflux disease)     occasional  . Hyperlipidemia   . Hypertension     Echocardiogram 11/19/11: Difficult acoustic windows, EF 60-65%, normal LV wall thickness, grade 1 diastolic dysfunction  . Vertigo   . Tubular adenoma of colon 06/2011  . Colitis   . Multinodular thyroid     Goiter s/p thyroidectomy in 2007 with post-op  hypocalcemia and post-op hypothyroidism/hypoparathyroidism with hypocalcemia  . CAD (coronary artery disease)     a) NSTEMI 10/2011: LHC 11/19/11: pLAD 30%, oOM 60%, AVCFX 30%, CFX after OM2 30%, pRCA 60 and 70%, then 99%, AM 80-90% with TIMI 3 flow.  PCI: Promus DES x 2 to RCA.  Marland Kitchen Post-surgical hypothyroidism   . Hypoparathyroidism   . Palpitations     a) 03/2012 - patient set up for event monitor but did not wear correctly -  she declined wearing a repeat monitor  . Unspecified constipation 05/31/2013  . Back pain 05/31/2013  . Nocturia 05/31/2013    Past Surgical History  Procedure Laterality Date  . Colonoscopy  Aenomatous polyps    07/05/2011  . Total thyroidectomy  2007    GOITER  . Shoulder arthroscopy w/ rotator cuff repair      right    Family  History  Problem Relation Age of Onset  . Colon cancer Brother   . Cancer Brother     COLON  . Hypertension Father   . Diabetes Neg Hx   . Prostate cancer Neg Hx   . Heart disease Father   . Breast cancer Neg Hx     History   Social History  . Marital Status: Single    Spouse Name: N/A    Number of Children: N/A  . Years of Education: N/A   Occupational History  . Not on file.   Social History Main Topics  . Smoking status: Never Smoker   . Smokeless tobacco: Never Used  . Alcohol Use: No  . Drug Use: No  . Sexually Active: Yes   Other Topics Concern  . Not on file   Social History Narrative  . No narrative on file    Current Outpatient Prescriptions on File Prior to Visit  Medication Sig Dispense Refill  . amLODipine (NORVASC) 10 MG tablet Take 1 tablet (10 mg total) by mouth daily.  90 tablet  3  . aspirin EC 81 MG tablet Take 81 mg by mouth daily.        . calcitRIOL (ROCALTROL) 0.25 MCG capsule Take 2 capsules (0.5 mcg total) by mouth 2 (two) times daily.  360  capsule  3  . calcium carbonate (TUMS - DOSED IN MG ELEMENTAL CALCIUM) 500 MG chewable tablet Chew 4 tablets by mouth 2 (two) times daily.       . clopidogrel (PLAVIX) 75 MG tablet Take 1 tablet (75 mg total) by mouth daily with breakfast.  90 tablet  3  . CYANOCOBALAMIN PO Take 1 tablet by mouth daily.      . cyclobenzaprine (FLEXERIL) 5 MG tablet Take 5 mg by mouth at bedtime as needed. For muscle spasms      . famotidine (PEPCID) 20 MG tablet Take 20 mg by mouth 2 (two) times daily.      . isosorbide mononitrate (IMDUR) 60 MG 24 hr tablet Take 1 tablet (60 mg total) by mouth daily.  90 tablet  3  . levothyroxine (SYNTHROID, LEVOTHROID) 125 MCG tablet Take 1 tablet (125 mcg total) by mouth daily.  90 tablet  3  . nitroGLYCERIN (NITROSTAT) 0.4 MG SL tablet Place 1 tablet (0.4 mg total) under the tongue every 5 (five) minutes as needed for chest pain.  25 tablet  3  . rosuvastatin (CRESTOR) 10 MG tablet  Take 1 tablet (10 mg total) by mouth daily.  90 tablet  3  . vitamin D, CHOLECALCIFEROL, 400 UNITS tablet Take 1 tablet (400 Units total) by mouth 2 (two) times daily.  60 tablet  1   No current facility-administered medications on file prior to visit.    No Known Allergies  Review of Systems  Review of Systems  Constitutional: Negative for fever and malaise/fatigue.  HENT: Negative for congestion.   Eyes: Negative for discharge.  Respiratory: Negative for shortness of breath.   Cardiovascular: Negative for chest pain, palpitations and leg swelling.  Gastrointestinal: Negative for nausea, abdominal pain and diarrhea.  Genitourinary: Negative for dysuria.  Musculoskeletal: Negative for falls.  Skin: Negative for rash.  Neurological: Negative for loss of consciousness and headaches.  Endo/Heme/Allergies: Negative for polydipsia.  Psychiatric/Behavioral: Negative for depression and suicidal ideas. The patient is not nervous/anxious and does not have insomnia.     Objective  BP 128/74  Pulse 75  Temp(Src) 98.1 F (36.7 C) (Oral)  Ht 5\' 3"  (1.6 m)  Wt 196 lb 0.6 oz (88.923 kg)  BMI 34.74 kg/m2  SpO2 96%  Physical Exam  Physical Exam  Constitutional: She is oriented to person, place, and time and well-developed, well-nourished, and in no distress. No distress.  HENT:  Head: Normocephalic and atraumatic.  Eyes: Conjunctivae are normal.  Neck: Neck supple. No thyromegaly present.  Cardiovascular: Normal rate, regular rhythm and normal heart sounds.   No murmur heard. Pulmonary/Chest: Effort normal and breath sounds normal. She has no wheezes.  Abdominal: She exhibits no distension and no mass.  Musculoskeletal: She exhibits no edema.  Lymphadenopathy:    She has no cervical adenopathy.  Neurological: She is alert and oriented to person, place, and time.  Skin: Skin is warm and dry. No rash noted. She is not diaphoretic.  Psychiatric: Memory, affect and judgment normal.     Lab Results  Component Value Date   TSH 0.371 05/28/2013   Lab Results  Component Value Date   WBC 6.5 05/28/2013   HGB 13.7 05/28/2013   HCT 38.4 05/28/2013   MCV 81.7 05/28/2013   PLT 279 05/28/2013   Lab Results  Component Value Date   CREATININE 1.05 05/28/2013   BUN 23 05/28/2013   NA 143 05/28/2013   K 4.1 05/28/2013   CL 104  05/28/2013   CO2 29 05/28/2013   Lab Results  Component Value Date   ALT 17 05/28/2013   AST 17 05/28/2013   ALKPHOS 70 05/28/2013   BILITOT 0.4 05/28/2013   Lab Results  Component Value Date   CHOL 204* 05/28/2013   Lab Results  Component Value Date   HDL 46 05/28/2013   Lab Results  Component Value Date   LDLCALC 128* 05/28/2013   Lab Results  Component Value Date   TRIG 149 05/28/2013   Lab Results  Component Value Date   CHOLHDL 4.4 05/28/2013     Assessment & Plan  Essential hypertension, benign Well controlled no changes.  Unspecified constipation Add probiotics and fiber, hydrate well  Nocturia Urine culture negative, encouraged adequate hydration and cranberry tabs  Unspecified hypothyroidism Well treated on current dose of meds  GERD (gastroesophageal reflux disease) Avoid offending foods, continue Pepcid and add Omeprazole  Back pain Encouraged moist head and gentle stretching. Given muscle relaxer to use qhs and continue NSAIDs prn

## 2013-05-31 NOTE — Assessment & Plan Note (Signed)
Well controlled no changes 

## 2013-05-31 NOTE — Assessment & Plan Note (Signed)
Urine culture negative, encouraged adequate hydration and cranberry tabs

## 2013-06-04 NOTE — Progress Notes (Signed)
Quick Note:  Patient Informed and voiced understanding.  Copy mailed to patient per pts request ______

## 2013-06-09 ENCOUNTER — Telehealth: Payer: Self-pay | Admitting: *Deleted

## 2013-06-09 MED ORDER — TIZANIDINE HCL 2 MG PO TABS: 2.0000 mg | ORAL_TABLET | Freq: Two times a day (BID) | ORAL | Status: DC | PRN

## 2013-06-09 NOTE — Telephone Encounter (Signed)
Received paperwork from patient stating that her Insurance will not cover Methocarbamol with alternative Rx[s] that are on formulary list; per provider change to Tizanidine 2 mg BID Muscle Spasms #60x1, new Rx to pharmacy/SLS

## 2013-06-10 ENCOUNTER — Encounter: Payer: Self-pay | Admitting: Orthopedic Surgery

## 2013-06-25 ENCOUNTER — Ambulatory Visit: Payer: Medicare Other | Admitting: Family Medicine

## 2013-07-13 ENCOUNTER — Encounter: Payer: Self-pay | Admitting: Family Medicine

## 2013-07-13 ENCOUNTER — Ambulatory Visit (INDEPENDENT_AMBULATORY_CARE_PROVIDER_SITE_OTHER): Payer: Medicare Other | Admitting: Family Medicine

## 2013-07-13 DIAGNOSIS — R079 Chest pain, unspecified: Secondary | ICD-10-CM

## 2013-07-13 DIAGNOSIS — I10 Essential (primary) hypertension: Secondary | ICD-10-CM

## 2013-07-13 DIAGNOSIS — E039 Hypothyroidism, unspecified: Secondary | ICD-10-CM

## 2013-07-13 DIAGNOSIS — E785 Hyperlipidemia, unspecified: Secondary | ICD-10-CM

## 2013-07-13 DIAGNOSIS — M542 Cervicalgia: Secondary | ICD-10-CM | POA: Insufficient documentation

## 2013-07-13 HISTORY — DX: Cervicalgia: M54.2

## 2013-07-13 LAB — RENAL FUNCTION PANEL
Albumin: 4.2 g/dL (ref 3.5–5.2)
CO2: 29 mEq/L (ref 19–32)
Calcium: 8.8 mg/dL (ref 8.4–10.5)
Potassium: 3.8 mEq/L (ref 3.5–5.3)
Sodium: 141 mEq/L (ref 135–145)

## 2013-07-13 MED ORDER — TRAMADOL HCL 50 MG PO TABS: 50.0000 mg | ORAL_TABLET | Freq: Every evening | ORAL | Status: DC | PRN

## 2013-07-13 MED ORDER — METHOCARBAMOL 500 MG PO TABS: 500.0000 mg | ORAL_TABLET | Freq: Four times a day (QID) | ORAL | Status: DC | PRN

## 2013-07-13 MED ORDER — ROSUVASTATIN CALCIUM 20 MG PO TABS: 20.0000 mg | ORAL_TABLET | Freq: Every day | ORAL | Status: DC

## 2013-07-13 NOTE — Assessment & Plan Note (Signed)
Continues to have occasional chest pain which responds to single dose of NTG. No associated symptoms. Should have follow up with cardiology in August she will seek sooner care if symptoms escalate.

## 2013-07-13 NOTE — Progress Notes (Signed)
Patient ID: [name], female   DOB: 1946/12/23, 67 y.o.   MRN: [id number] [name] [id number] 09-Sep-1946 07/13/2013      Progress Note-Follow Up  Subjective  Chief Complaint  Chief Complaint  Patient presents with  . Follow-up    4 week    HPI  Patient is a 67 year old female who is in today for followup. Her biggest complaint is of some persistent right neck and right shoulder pain. She's been having trouble since last November when she injured her shoulder at work. She is seeing [facility] orth[facility] and then offered surgery versus pain management but has not returned there in a while. At last visit we gave her some cyclobenzaprine which was only marginally helpful. She has some complaints of radicular symptoms into the arm as well but it is intermittent. Gets some temporary relief with Tylenol when necessary. Has pain in his low as 3/10 and as high as 9/10 at times. Denies any recent illness., Fevers, chills, chest pain, palpitations, shortness of breath, GI or GU complaints at this time.   Past Medical History  Diagnosis Date  . GERD (gastroesophageal reflux disease)     occasional  . Hyperlipidemia   . Hypertension     Echocardiogram 11/19/11: Difficult acoustic windows, EF 60-65%, normal LV wall thickness, grade 1 diastolic dysfunction  . Vertigo   . Tubular adenoma of colon 06/2011  . Colitis   . Multinodular thyroid     Goiter s/p thyroidectomy in 2007 with post-op  hypocalcemia and post-op hypothyroidism/hypoparathyroidism with hypocalcemia  . CAD (coronary artery disease)     a) NSTEMI 10/2011: LHC 11/19/11: pLAD 30%, oOM 60%, AVCFX 30%, CFX after OM2 30%, pRCA 60 and 70%, then 99%, AM 80-90% with TIMI 3 flow.  PCI: Promus DES x 2 to RCA.  Marland Kitchen Post-surgical hypothyroidism   . Hypoparathyroidism   . Palpitations     a) 03/2012 - patient set up for event monitor but did not wear correctly -  she declined wearing a repeat monitor  . Unspecified constipation  05/31/2013  . Back pain 05/31/2013  . Nocturia 05/31/2013  . Neck pain on right side 07/13/2013    Past Surgical History  Procedure Laterality Date  . Colonoscopy  Aenomatous polyps    07/05/2011  . Total thyroidectomy  2007    GOITER  . Shoulder arthroscopy w/ rotator cuff repair      right    Family History  Problem Relation Age of Onset  . Colon cancer Brother   . Cancer Brother     COLON  . Hypertension Father   . Diabetes Neg Hx   . Prostate cancer Neg Hx   . Heart disease Father   . Breast cancer Neg Hx     History   Social History  . Marital Status: Single    Spouse Name: N/A    Number of Children: N/A  . Years of Education: N/A   Occupational History  . Not on file.   Social History Main Topics  . Smoking status: Never Smoker   . Smokeless tobacco: Never Used  . Alcohol Use: No  . Drug Use: No  . Sexually Active: Yes   Other Topics Concern  . Not on file   Social History Narrative  . No narrative on file    Current Outpatient Prescriptions on File Prior to Visit  Medication Sig Dispense Refill  . amLODipine (NORVASC) 10 MG tablet Take 1 tablet (10 mg total) by mouth daily.  90 tablet  3  . aspirin EC 81 MG tablet Take 81 mg by mouth daily.        . calcitRIOL (ROCALTROL) 0.25 MCG capsule Take 2 capsules (0.5 mcg total) by mouth 2 (two) times daily.  360 capsule  3  . calcium carbonate (TUMS - DOSED IN MG ELEMENTAL CALCIUM) 500 MG chewable tablet Chew 4 tablets by mouth 2 (two) times daily.       . clopidogrel (PLAVIX) 75 MG tablet Take 1 tablet (75 mg total) by mouth daily with breakfast.  90 tablet  3  . CYANOCOBALAMIN PO Take 1 tablet by mouth daily.      . cyclobenzaprine (FLEXERIL) 5 MG tablet Take 5 mg by mouth at bedtime as needed. For muscle spasms      . famotidine (PEPCID) 20 MG tablet Take 20 mg by mouth 2 (two) times daily.      . isosorbide mononitrate (IMDUR) 60 MG 24 hr tablet Take 1 tablet (60 mg total) by mouth daily.  90 tablet  3  .  levothyroxine (SYNTHROID, LEVOTHROID) 125 MCG tablet Take 1 tablet (125 mcg total) by mouth daily.  90 tablet  3  . nitroGLYCERIN (NITROSTAT) 0.4 MG SL tablet Place 1 tablet (0.4 mg total) under the tongue every 5 (five) minutes as needed for chest pain.  25 tablet  3  . vitamin D, CHOLECALCIFEROL, 400 UNITS tablet Take 1 tablet (400 Units total) by mouth 2 (two) times daily.  60 tablet  1   No current facility-administered medications on file prior to visit.    No Known Allergies  Review of Systems  Review of Systems  Constitutional: Negative for fever and malaise/fatigue.  HENT: Negative for congestion.   Eyes: Negative for pain and discharge.  Respiratory: Negative for shortness of breath.   Cardiovascular: Negative for chest pain, palpitations and leg swelling.  Gastrointestinal: Negative for nausea, abdominal pain and diarrhea.  Genitourinary: Negative for dysuria.  Musculoskeletal: Positive for joint pain. Negative for falls.  Skin: Negative for rash.  Neurological: Negative for loss of consciousness and headaches.  Endo/Heme/Allergies: Negative for polydipsia.  Psychiatric/Behavioral: Negative for depression and suicidal ideas. The patient is not nervous/anxious and does not have insomnia.     Objective  BP 116/78  Pulse 74  Temp(Src) 98 F (36.7 C) (Oral)  Ht 5\' 5"  (1.651 m)  Wt 191 lb 1.3 oz (86.673 kg)  BMI 31.8 kg/m2  SpO2 96%  Physical Exam  Physical Exam  Constitutional: She is oriented to person, place, and time and well-developed, well-nourished, and in no distress. No distress.  HENT:  Head: Normocephalic and atraumatic.  Eyes: Conjunctivae are normal.  Neck: Neck supple. No thyromegaly present.  Cardiovascular: Normal rate and regular rhythm.  Exam reveals no gallop.   No murmur heard. Pulmonary/Chest: Effort normal and breath sounds normal. She has no wheezes.  Abdominal: She exhibits no distension and no mass.  Musculoskeletal: She exhibits no  edema.  Lymphadenopathy:    She has no cervical adenopathy.  Neurological: She is alert and oriented to person, place, and time.  Skin: Skin is warm and dry. No rash noted. She is not diaphoretic.  Psychiatric: Memory, affect and judgment normal.    Lab Results  Component Value Date   TSH 0.371 05/28/2013   Lab Results  Component Value Date   WBC 6.5 05/28/2013   HGB 13.7 05/28/2013   HCT 38.4 05/28/2013   MCV 81.7 05/28/2013   PLT 279 05/28/2013  Lab Results  Component Value Date   CREATININE 1.05 05/28/2013   BUN 23 05/28/2013   NA 143 05/28/2013   K 4.1 05/28/2013   CL 104 05/28/2013   CO2 29 05/28/2013   Lab Results  Component Value Date   ALT 17 05/28/2013   AST 17 05/28/2013   ALKPHOS 70 05/28/2013   BILITOT 0.4 05/28/2013   Lab Results  Component Value Date   CHOL 204* 05/28/2013   Lab Results  Component Value Date   HDL 46 05/28/2013   Lab Results  Component Value Date   LDLCALC 128* 05/28/2013   Lab Results  Component Value Date   TRIG 149 05/28/2013   Lab Results  Component Value Date   CHOLHDL 4.4 05/28/2013     Assessment & Plan  CHEST PAIN UNSPECIFIED Continues to have occasional chest pain which responds to single dose of NTG. No associated symptoms. Should have follow up with cardiology in August she will seek sooner care if symptoms escalate.   Essential hypertension, benign Well controlled, no changes to meds  Unspecified hypothyroidism TSH wnl, continue Levothyroxine.  Neck pain on right side Has used cyclobenzaprine infrequently with some response but only partial. Will try Robaxin 5 mg up to 4 times a day if needed. Encouraged a lot of hot patches and gentle stretching. Has had follow with [facility] in the past and is actually been offered surgery versus pain management. At this time she is encouraged to return to his orthopedics Dr. Derenda Fennel for pain management and she is in agreement.  HYPERLIPIDEMIA-MIXED Avoid transplants. Increase exercise and continue  Crestor. Dose increased to 20 mg daily due to her history of CAD and imperfect control of her hyperlipidemia at this time

## 2013-07-13 NOTE — Assessment & Plan Note (Signed)
TSH wnl, continue Levothyroxine.  

## 2013-07-13 NOTE — Patient Instructions (Addendum)
Rel of rec Dr Mikael Spray, Tomasita Crumble, MIR and xray of neck and right shoulder pain  Cholesterol Cholesterol is a white, waxy, fat-like protein needed by your body in small amounts. The liver makes all the cholesterol you need. It is carried from the liver by the blood through the blood vessels. Deposits (plaque) may build up on blood vessel walls. This makes the arteries narrower and stiffer. Plaque increases the risk for heart attack and stroke. You cannot feel your cholesterol level even if it is very high. The only way to know is by a blood test to check your lipid (fats) levels. Once you know your cholesterol levels, you should keep a record of the test results. Work with your caregiver to to keep your levels in the desired range. WHAT THE RESULTS MEAN:  Total cholesterol is a rough measure of all the cholesterol in your blood.  LDL is the so-called bad cholesterol. This is the type that deposits cholesterol in the walls of the arteries. You want this level to be low.  HDL is the good cholesterol because it cleans the arteries and carries the LDL away. You want this level to be high.  Triglycerides are fat that the body can either burn for energy or store. High levels are closely linked to heart disease. DESIRED LEVELS:  Total cholesterol below 200.  LDL below 100 for people at risk, below 70 for very high risk.  HDL above 50 is good, above 60 is best.  Triglycerides below 150. HOW TO LOWER YOUR CHOLESTEROL:  Diet.  Choose fish or white meat chicken and [place], roasted or baked. Limit fatty cuts of red meat, fried foods, and processed meats, such as sausage and lunch meat.  Eat lots of fresh fruits and vegetables. Choose whole grains, beans, pasta, potatoes and cereals.  Use only small amounts of olive, corn or canola oils. Avoid butter, mayonnaise, shortening or palm kernel oils. Avoid foods with trans-fats.  Use skim/nonfat milk and low-fat/nonfat yogurt and cheeses.  Avoid whole milk, cream, ice cream, egg yolks and cheeses. Healthy desserts include angel food cake, ginger snaps, animal crackers, hard candy, popsicles, and low-fat/nonfat frozen yogurt. Avoid pastries, cakes, pies and cookies.  Exercise.  A regular program helps decrease LDL and raises HDL.  Helps with weight control.  Do things that increase your activity level like gardening, walking, or taking the stairs.  Medication.  May be prescribed by your caregiver to help lowering cholesterol and the risk for heart disease.  You may need medicine even if your levels are normal if you have several risk factors. HOME CARE INSTRUCTIONS   Follow your diet and exercise programs as suggested by your caregiver.  Take medications as directed.  Have blood work done when your caregiver feels it is necessary. MAKE SURE YOU:   Understand these instructions.  Will watch your condition.  Will get help right away if you are not doing well or get worse. Document Released: 09/04/2001 Document Revised: 03/03/2012 Document Reviewed: 02/25/2008 [facility] Patient Information 2014 [place], [place].

## 2013-07-13 NOTE — Assessment & Plan Note (Signed)
Has used cyclobenzaprine infrequently with some response but only partial. Will try Robaxin 5 mg up to 4 times a day if needed. Encouraged a lot of hot patches and gentle stretching. Has had follow with [facility] in the past and is actually been offered surgery versus pain management. At this time she is encouraged to return to his orthopedics Dr. Derenda Fennel for pain management and she is in agreement.

## 2013-07-13 NOTE — Assessment & Plan Note (Signed)
Well controlled, no changes to meds 

## 2013-07-13 NOTE — Assessment & Plan Note (Signed)
Avoid transplants. Increase exercise and continue Crestor. Dose increased to 20 mg daily due to her history of CAD and imperfect control of her hyperlipidemia at this time

## 2013-07-14 LAB — VITAMIN D 25 HYDROXY (VIT D DEFICIENCY, FRACTURES): Vit D, 25-Hydroxy: 49 ng/mL (ref 30–89)

## 2013-07-15 ENCOUNTER — Telehealth: Payer: Self-pay

## 2013-07-15 DIAGNOSIS — I10 Essential (primary) hypertension: Secondary | ICD-10-CM

## 2013-07-15 NOTE — Telephone Encounter (Signed)
Per Barry Dienes can't add PTH due to it having to be frozen. Pt would need to come in to get blood drawn again? Please advise?

## 2013-07-15 NOTE — Progress Notes (Signed)
Quick Note:  Patient Informed and voiced understanding about the Vitamin d.  Katrina with [facility] is getting the PTH added ______

## 2013-07-15 NOTE — Telephone Encounter (Signed)
Please have patient return for repeat renal panel and a PTH in next couple of weeks

## 2013-07-16 NOTE — Telephone Encounter (Signed)
Pt informed and lab order placed

## 2013-07-23 LAB — RENAL FUNCTION PANEL
Albumin: 4 g/dL (ref 3.5–5.2)
CO2: 31 mEq/L (ref 19–32)
Calcium: 8.1 mg/dL — ABNORMAL LOW (ref 8.4–10.5)
Sodium: 142 mEq/L (ref 135–145)

## 2013-07-24 ENCOUNTER — Other Ambulatory Visit: Payer: Self-pay | Admitting: Family Medicine

## 2013-07-24 LAB — PTH, INTACT AND CALCIUM
Calcium: 8.1 mg/dL — ABNORMAL LOW (ref 8.4–10.5)
PTH: <2.5 pg/mL — ABNORMAL LOW (ref 14.0–72.0)

## 2013-08-03 ENCOUNTER — Ambulatory Visit (INDEPENDENT_AMBULATORY_CARE_PROVIDER_SITE_OTHER): Payer: Medicare Other | Admitting: Endocrinology

## 2013-08-03 ENCOUNTER — Encounter: Payer: Self-pay | Admitting: Endocrinology

## 2013-08-03 VITALS — BP 120/64 | HR 62 | Temp 98.3°F | Resp 12 | Ht 64.0 in | Wt 190.6 lb

## 2013-08-03 DIAGNOSIS — E209 Hypoparathyroidism, unspecified: Secondary | ICD-10-CM

## 2013-08-03 DIAGNOSIS — E892 Postprocedural hypoparathyroidism: Secondary | ICD-10-CM | POA: Insufficient documentation

## 2013-08-03 DIAGNOSIS — E039 Hypothyroidism, unspecified: Secondary | ICD-10-CM

## 2013-08-03 MED ORDER — CALCITRIOL 0.25 MCG PO CAPS: 0.7500 ug | ORAL_CAPSULE | Freq: Every day | ORAL | Status: DC

## 2013-08-03 NOTE — Patient Instructions (Signed)
Use  3 Calcitriol daily and 6 Calcium tabs

## 2013-08-03 NOTE — Progress Notes (Signed)
Patient ID: [name], female   DOB: 1946/02/11, 67 y.o.   MRN: [id number]  CHIEF complaint: Low calcium  History of Present Illness:  Patient had subtotal thyroidectomy in 2007 because of multiple nodules which were benign Postoperatively the patient had hoarseness along with hypocalcemia. She had symptoms of numbness all over with the hypocalcemia which improved with treatment Since after her surgery she has been given calcitriol and calcium supplements for treatment of her hypoparathyroidism She believes she was taking a total of 4 capsules of calcitriol 0.25 mcg until about 2 years ago and the dose was reduced for unknown reasons  Recent history: The patient's calcium levels are as below. She has had a tendency to hypocalcemia since 04/2012 She again has had symptoms of numbness in her hands and face periodically including recently Because of her symptoms she increased taking her calcium from her TUMS by 2 tablets and is now feeling better She also has had periodic leg cramps when she has a low calcium Because of her tendency to low calcium she has been to her by her PCP to switch from TUMS to Caltrate and she is taking 4 tablets daily She is now referred for further management  Lab Results  Component Value Date   CALCIUM 8.1* 07/16/2013   CALCIUM 8.1* 07/16/2013   CALCIUM 8.8 07/13/2013   CALCIUM 7.6* 05/28/2013   CALCIUM 8.1* 11/22/2012   CALCIUM 8.1* 08/11/2012   CALCIUM 8.7 07/17/2012   CALCIUM 8.2* 06/05/2012   CALCIUM 7.9* 05/23/2012   CALCIUM 9.1 05/22/2012   CALCIUM 8.8 04/07/2012    Past Medical History  Diagnosis Date  . GERD (gastroesophageal reflux disease)     occasional  . Hyperlipidemia   . Hypertension     Echocardiogram 11/19/11: Difficult acoustic windows, EF 60-65%, normal LV wall thickness, grade 1 diastolic dysfunction  . Vertigo   . Tubular adenoma of colon 06/2011  . Colitis   . Multinodular thyroid     Goiter s/p thyroidectomy in 2007 with post-op   hypocalcemia and post-op hypothyroidism/hypoparathyroidism with hypocalcemia  . CAD (coronary artery disease)     a) NSTEMI 10/2011: LHC 11/19/11: pLAD 30%, oOM 60%, AVCFX 30%, CFX after OM2 30%, pRCA 60 and 70%, then 99%, AM 80-90% with TIMI 3 flow.  PCI: Promus DES x 2 to RCA.  Marland Kitchen Post-surgical hypothyroidism   . Hypoparathyroidism   . Palpitations     a) 03/2012 - patient set up for event monitor but did not wear correctly -  she declined wearing a repeat monitor  . Unspecified constipation 05/31/2013  . Back pain 05/31/2013  . Nocturia 05/31/2013  . Neck pain on right side 07/13/2013    Past Surgical History  Procedure Laterality Date  . Colonoscopy  Aenomatous polyps    07/05/2011  . Total thyroidectomy  2007    GOITER  . Shoulder arthroscopy w/ rotator cuff repair      right    Family History  Problem Relation Age of Onset  . Colon cancer Brother   . Cancer Brother     COLON  . Hypertension Father   . Diabetes Neg Hx   . Prostate cancer Neg Hx   . Heart disease Father   . Breast cancer Neg Hx     Social History:  reports that she has never smoked. She has never used smokeless tobacco. She reports that she does not drink alcohol or use illicit drugs.  Allergies: No Known Allergies    Medication List  This list is accurate as of: 08/03/13 10:35 AM.  Always use your most recent med list.               amLODipine 10 MG tablet  Commonly known as:  NORVASC  Take 1 tablet (10 mg total) by mouth daily.     aspirin EC 81 MG tablet  Take 81 mg by mouth daily.     calcitRIOL 0.25 MCG capsule  Commonly known as:  ROCALTROL  Take 2 capsules (0.5 mcg total) by mouth 2 (two) times daily.     calcium carbonate 500 MG chewable tablet  Commonly known as:  TUMS - dosed in mg elemental calcium  Chew 4 tablets by mouth 2 (two) times daily.     clopidogrel 75 MG tablet  Commonly known as:  PLAVIX  Take 1 tablet (75 mg total) by mouth daily with breakfast.      CYANOCOBALAMIN PO  Take 1 tablet by mouth daily.     cyclobenzaprine 5 MG tablet  Commonly known as:  FLEXERIL  Take 5 mg by mouth at bedtime as needed. For muscle spasms     famotidine 20 MG tablet  Commonly known as:  PEPCID  Take 20 mg by mouth 2 (two) times daily.     isosorbide mononitrate 60 MG 24 hr tablet  Commonly known as:  IMDUR  Take 1 tablet (60 mg total) by mouth daily.     levothyroxine 125 MCG tablet  Commonly known as:  SYNTHROID, LEVOTHROID  Take 1 tablet (125 mcg total) by mouth daily.     methocarbamol 500 MG tablet  Commonly known as:  ROBAXIN  Take 1 tablet (500 mg total) by mouth 4 (four) times daily as needed.     nitroGLYCERIN 0.4 MG SL tablet  Commonly known as:  NITROSTAT  Place 1 tablet (0.4 mg total) under the tongue every 5 (five) minutes as needed for chest pain.     rosuvastatin 20 MG tablet  Commonly known as:  CRESTOR  Take 1 tablet (20 mg total) by mouth daily.     traMADol 50 MG tablet  Commonly known as:  ULTRAM  Take 1 tablet (50 mg total) by mouth at bedtime as needed for pain.     vitamin D (CHOLECALCIFEROL) 400 UNITS tablet  Take 1 tablet (400 Units total) by mouth 2 (two) times daily.        No visits with results within 1 Week(s) from this visit. Latest known visit with results is:  Telephone on 07/15/2013  Component Date Value Range Status  . Sodium 07/16/2013 142  135 - 145 mEq/L Final  . Potassium 07/16/2013 4.2  3.5 - 5.3 mEq/L Final  . Chloride 07/16/2013 102  96 - 112 mEq/L Final  . CO2 07/16/2013 31  19 - 32 mEq/L Final  . Glucose, Bld 07/16/2013 116* 70 - 99 mg/dL Final  . BUN 16/09/9603 30* 6 - 23 mg/dL Final  . Creat 54/08/8118 1.16* 0.50 - 1.10 mg/dL Final  . Albumin 14/78/2956 4.0  3.5 - 5.2 g/dL Final  . Calcium 21/30/8657 8.1* 8.4 - 10.5 mg/dL Final  . Phosphorus 84/69/6295 5.5* 2.3 - 4.6 mg/dL Final  . PTH 28/41/3244 <2.5* 14.0 - 72.0 pg/mL Final  . Calcium 07/16/2013 8.1* 8.4 - 10.5 mg/dL Final      REVIEW OF SYSTEMS:           Skin: No rash or infections     Thyroid:  No cold or heat  intolerance, recent fatigue. She has been hypothyroid since her surgery and is compliant with her medication  Lab Results  Component Value Date   TSH 0.371 05/28/2013     Hair loss: She has complained of this for a few years, no etiology found     His blood pressure has been treated with medications, not taking amlodipine     No swelling of feet.     No shortness of breath on exertion.     Bowel habits:  No change        No joint pains but has had discomfort in her neck and back  Treated withtylenol      She had menopause at the age of 65   PHYSICAL EXAM:  BP 120/64  Pulse 62  Temp(Src) 98.3 F (36.8 C)  Resp 12  Ht 5\' 4"  (1.626 m)  Wt 190 lb 9.6 oz (86.456 kg)  BMI 32.7 kg/m2  SpO2 98%  GENERAL: Well-built and nourished   No pallor, clubbing, lymphadenopathy or edema.  Skin:  no rash or pigmentation.  EYES:  Externally normal.    ENT: Oral mucosa and tongue normal.  THYROID:  Not palpable.   HEART:  Normal S1 and S2; no murmur or click.  CHEST:  Normal shape  Lungs: Vescicular breath sounds heard equally.  No crepitations/ wheeze.  ABDOMEN: Exam not indicated:   NEUROLOGICAL: Chvostek negative .Reflexes are absent bilaterally at ankles but normal at biceps.  JOINTS:  Normal.  ASSESSMENT:    Hypoparathyroidism, postoperative with persistent hypocalcemia She is currently taking only 0.5 mcg of calcitriol daily along with about 2 g of calcium She previously had done better with taking higher doses of calcitriol Mild increase in serum phosphate is also related to hypoparathyroidism She is symptomatically somewhat better with switching to Caltrate instead of TUMS and increasing her total calcium intake  HYPOTHYROIDISM: This has been fairly well regulated with normal TSH, should repeat this in 6 months   PLAN:   For optimal regulation of her  hypoparathyroidism   she should be on at least 0.75 mcg of calcitriol and this was discussed with her She will continue the same doses of calcium and continue Caltrate with 6 tablets a day  She will have a followup  calcium level in 6 weeksAnd then periodically  [facility] 08/03/2013, 10:35 AM

## 2013-08-04 ENCOUNTER — Other Ambulatory Visit: Payer: Self-pay

## 2013-08-04 MED ORDER — FAMOTIDINE 20 MG PO TABS: 20.0000 mg | ORAL_TABLET | Freq: Two times a day (BID) | ORAL | Status: DC

## 2013-08-09 ENCOUNTER — Emergency Department (HOSPITAL_COMMUNITY)
Admission: EM | Admit: 2013-08-09 | Discharge: 2013-08-09 | Disposition: A | Discharge status: Home / Self Care | Payer: Medicare Other | Attending: Emergency Medicine | Admitting: Emergency Medicine

## 2013-08-09 ENCOUNTER — Encounter (HOSPITAL_COMMUNITY): Payer: Self-pay | Admitting: *Deleted

## 2013-08-09 ENCOUNTER — Emergency Department (HOSPITAL_COMMUNITY): Payer: Medicare Other

## 2013-08-09 DIAGNOSIS — M542 Cervicalgia: Secondary | ICD-10-CM | POA: Insufficient documentation

## 2013-08-09 DIAGNOSIS — E785 Hyperlipidemia, unspecified: Secondary | ICD-10-CM | POA: Insufficient documentation

## 2013-08-09 DIAGNOSIS — K219 Gastro-esophageal reflux disease without esophagitis: Secondary | ICD-10-CM | POA: Insufficient documentation

## 2013-08-09 DIAGNOSIS — I252 Old myocardial infarction: Secondary | ICD-10-CM | POA: Insufficient documentation

## 2013-08-09 DIAGNOSIS — R0789 Other chest pain: Secondary | ICD-10-CM | POA: Insufficient documentation

## 2013-08-09 DIAGNOSIS — I10 Essential (primary) hypertension: Secondary | ICD-10-CM | POA: Insufficient documentation

## 2013-08-09 DIAGNOSIS — E042 Nontoxic multinodular goiter: Secondary | ICD-10-CM | POA: Insufficient documentation

## 2013-08-09 DIAGNOSIS — E209 Hypoparathyroidism, unspecified: Secondary | ICD-10-CM | POA: Insufficient documentation

## 2013-08-09 DIAGNOSIS — R079 Chest pain, unspecified: Secondary | ICD-10-CM

## 2013-08-09 DIAGNOSIS — Z79899 Other long term (current) drug therapy: Secondary | ICD-10-CM | POA: Insufficient documentation

## 2013-08-09 DIAGNOSIS — Z7982 Long term (current) use of aspirin: Secondary | ICD-10-CM | POA: Insufficient documentation

## 2013-08-09 DIAGNOSIS — E890 Postprocedural hypothyroidism: Secondary | ICD-10-CM | POA: Insufficient documentation

## 2013-08-09 DIAGNOSIS — Z9861 Coronary angioplasty status: Secondary | ICD-10-CM | POA: Insufficient documentation

## 2013-08-09 DIAGNOSIS — J3489 Other specified disorders of nose and nasal sinuses: Secondary | ICD-10-CM | POA: Insufficient documentation

## 2013-08-09 DIAGNOSIS — R51 Headache: Secondary | ICD-10-CM | POA: Insufficient documentation

## 2013-08-09 LAB — CBC
HCT: 36.5 % (ref 36.0–46.0)
MCHC: 34.5 g/dL (ref 30.0–36.0)
MCV: 84.9 fL (ref 78.0–100.0)
Platelets: 194 10*3/uL (ref 150–400)
RDW: 13.9 % (ref 11.5–15.5)
WBC: 8.4 10*3/uL (ref 4.0–10.5)

## 2013-08-09 LAB — BASIC METABOLIC PANEL
BUN: 18 mg/dL (ref 6–23)
CO2: 28 mEq/L (ref 19–32)
Calcium: 8.3 mg/dL — ABNORMAL LOW (ref 8.4–10.5)
Creatinine, Ser: 1.16 mg/dL — ABNORMAL HIGH (ref 0.50–1.10)

## 2013-08-09 LAB — POCT I-STAT TROPONIN I: Troponin i, poc: 0 ng/mL (ref 0.00–0.08)

## 2013-08-09 MED ORDER — FAMOTIDINE 20 MG PO TABS: 20.0000 mg | ORAL_TABLET | Freq: Two times a day (BID) | ORAL | Status: DC

## 2013-08-09 NOTE — ED Notes (Signed)
Pt reports having mid chest pains x 1 hour, sharp pains that are non radiating. Pain does not increase with movement or breathing, denies cough. Denies any n/v or sob. ekg done at triage.

## 2013-08-09 NOTE — ED Notes (Addendum)
PT c/o CP that felt different then last CP episode. This CP felt like burning. Repeat EKG done. Will notify MD. PT denies pain at this time.

## 2013-08-09 NOTE — ED Provider Notes (Signed)
CSN: [id number]     Arrival date & time 08/09/13  1314 History     First MD Initiated Contact with Patient 08/09/13 1350     Chief Complaint  Patient presents with  . Chest Pain   (Consider location/radiation/quality/duration/timing/severity/associated sxs/prior Treatment) Patient is a 67 y.o. female presenting with chest pain.  Chest Pain Associated symptoms: headache   Associated symptoms: no abdominal pain, no back pain, no cough, no diaphoresis, no fever, no nausea, no palpitations, no shortness of breath and not vomiting    Kourtnee T Gass is a 67 year old female with PMH of CAD s/p BMSx2 to RCA in 10/2011. Who presents to ED complaining of left side chest pain.  Patient denies any current chest pain but reports that around 12:45 this afternoon she had gone to work and started having chest pain.  This is not too unusual for her and she normally take NTG for chest pain.  However today she took 2 aspirin.  She reports she began to feel better but decided to go home from work.  Her daughter and husband were concerned about her and wanted to have her come to the ED to be evaluated. Per patient's record she had a positive stress test after her stent placement and underwent left heart cath on 07/17/12 which showed "ontinued patency of RCA stents. There was no change in the LCA with collateralls to R acut marginal (that supplies the distal PDA). There was subtotal occlusion of the acute marginal (exits mid stent). Dr. Riley Kill reviewed. Recomm medical Rx before trying intervention." She refused Metoprolol 25mg  bid but allowed to be continued on Imdur.    Her last PCP visit on 07/24/13 noted her continued episodes of chest pain and encouraged her to follow up with cardiology.  She reports that she called cardiology earlier this month and was told she did not have an appointment, and was not currently having chest pain so she decided she did not need to make an appointment.   Past Medical History   Diagnosis Date  . GERD (gastroesophageal reflux disease)     occasional  . Hyperlipidemia   . Hypertension     Echocardiogram 11/19/11: Difficult acoustic windows, EF 60-65%, normal LV wall thickness, grade 1 diastolic dysfunction  . Vertigo   . Tubular adenoma of colon 06/2011  . Colitis   . Multinodular thyroid     Goiter s/p thyroidectomy in 2007 with post-op  hypocalcemia and post-op hypothyroidism/hypoparathyroidism with hypocalcemia  . CAD (coronary artery disease)     a) NSTEMI 10/2011: LHC 11/19/11: pLAD 30%, oOM 60%, AVCFX 30%, CFX after OM2 30%, pRCA 60 and 70%, then 99%, AM 80-90% with TIMI 3 flow.  PCI: Promus DES x 2 to RCA.  Marland Kitchen Post-surgical hypothyroidism   . Hypoparathyroidism   . Palpitations     a) 03/2012 - patient set up for event monitor but did not wear correctly -  she declined wearing a repeat monitor  . Unspecified constipation 05/31/2013  . Back pain 05/31/2013  . Nocturia 05/31/2013  . Neck pain on right side 07/13/2013   Past Surgical History  Procedure Laterality Date  . Colonoscopy  Aenomatous polyps    07/05/2011  . Total thyroidectomy  2007    GOITER  . Shoulder arthroscopy w/ rotator cuff repair      right   Family History  Problem Relation Age of Onset  . Colon cancer Brother   . Cancer Brother     COLON  .  Hypertension Father   . Diabetes Neg Hx   . Prostate cancer Neg Hx   . Heart disease Father   . Breast cancer Neg Hx    History  Substance Use Topics  . Smoking status: Never Smoker   . Smokeless tobacco: Never Used  . Alcohol Use: No   OB History   Grav Para Term Preterm Abortions TAB SAB Ect Mult Living   4 4 4       4      Review of Systems  Constitutional: Negative for fever, diaphoresis, activity change and appetite change.  HENT: Positive for congestion and neck pain. Negative for sore throat.   Eyes: Negative for visual disturbance.  Respiratory: Negative for cough, chest tightness, shortness of breath and wheezing.    Cardiovascular: Positive for chest pain. Negative for palpitations and leg swelling.  Gastrointestinal: Negative for nausea, vomiting, abdominal pain, diarrhea, constipation and abdominal distention.  Genitourinary: Negative for dysuria and frequency.  Musculoskeletal: Negative for back pain.  Skin: Negative for rash.  Neurological: Positive for headaches.  Psychiatric/Behavioral: Negative for confusion.    Allergies  Review of patient's allergies indicates no known allergies.  Home Medications   Current Outpatient Rx  Name  Route  Sig  Dispense  Refill  . amLODipine (NORVASC) 10 MG tablet   Oral   Take 1 tablet (10 mg total) by mouth daily.   90 tablet   3     DO NOT SEND ORDER TILL SHE CALLS FOR REORDER.   Marland Kitchen aspirin EC 81 MG tablet   Oral   Take 81 mg by mouth daily.           . calcitRIOL (ROCALTROL) 0.25 MCG capsule   Oral   Take 3 capsules (0.75 mcg total) by mouth daily.   360 capsule   3   . calcium carbonate (TUMS - DOSED IN MG ELEMENTAL CALCIUM) 500 MG chewable tablet   Oral   Chew 4 tablets by mouth 2 (two) times daily.          . clopidogrel (PLAVIX) 75 MG tablet   Oral   Take 1 tablet (75 mg total) by mouth daily with breakfast.   90 tablet   3   . CYANOCOBALAMIN PO   Oral   Take 1 tablet by mouth daily.         . cyclobenzaprine (FLEXERIL) 5 MG tablet   Oral   Take 5 mg by mouth at bedtime as needed. For muscle spasms         . famotidine (PEPCID) 20 MG tablet   Oral   Take 1 tablet (20 mg total) by mouth 2 (two) times daily.   60 tablet   0     Next refill needs to come from primary doctor.   . isosorbide mononitrate (IMDUR) 60 MG 24 hr tablet   Oral   Take 1 tablet (60 mg total) by mouth daily.   90 tablet   3   . levothyroxine (SYNTHROID, LEVOTHROID) 125 MCG tablet   Oral   Take 1 tablet (125 mcg total) by mouth daily.   90 tablet   3   . methocarbamol (ROBAXIN) 500 MG tablet   Oral   Take 1 tablet (500 mg total) by  mouth 4 (four) times daily as needed.   90 tablet   1   . nitroGLYCERIN (NITROSTAT) 0.4 MG SL tablet   Sublingual   Place 1 tablet (0.4 mg total) under the tongue every  5 (five) minutes as needed for chest pain.   25 tablet   3   . rosuvastatin (CRESTOR) 20 MG tablet   Oral   Take 1 tablet (20 mg total) by mouth daily.   30 tablet   5   . traMADol (ULTRAM) 50 MG tablet   Oral   Take 1 tablet (50 mg total) by mouth at bedtime as needed for pain.   30 tablet   1   . vitamin D, CHOLECALCIFEROL, 400 UNITS tablet   Oral   Take 1 tablet (400 Units total) by mouth 2 (two) times daily.   60 tablet   1    BP 134/60  Pulse 67  Temp(Src) 98.2 F (36.8 C) (Oral)  Resp 18  SpO2 95% Physical Exam  Nursing note and vitals reviewed. Constitutional: She is oriented to person, place, and time. She appears well-developed and well-nourished. No distress.  HENT:  Head: Normocephalic and atraumatic.  Eyes: Conjunctivae and EOM are normal. Pupils are equal, round, and reactive to light.  Cardiovascular: Normal rate, regular rhythm, normal heart sounds and intact distal pulses.   No murmur heard. Pulmonary/Chest: Breath sounds normal. No respiratory distress. She has no wheezes. She has no rales. She exhibits no tenderness.  Abdominal: Soft. Bowel sounds are normal. She exhibits no distension. There is no tenderness.  Musculoskeletal: She exhibits no edema.  Neurological: She is alert and oriented to person, place, and time.  Skin: Skin is warm and dry. She is not diaphoretic.    ED Course   Procedures (including critical care time)  Date: 08/09/2013  Rate: 77  Rhythm: normal sinus rhythm  QRS Axis: normal  Intervals: normal  ST/T Wave abnormalities: nonspecific ST/T changes  Conduction Disutrbances:none  Narrative Interpretation: Normal Sinus rhythm with some ST depression in lead V5.  Old EKG Reviewed: changes noted    Labs Reviewed  BASIC METABOLIC PANEL - Abnormal;  Notable for the following:    Creatinine, Ser 1.16 (*)    Calcium 8.3 (*)    GFR calc non Af Amer 48 (*)    GFR calc Af Amer 56 (*)    All other components within normal limits  CBC  POCT I-STAT TROPONIN I  POCT I-STAT TROPONIN I   Dg Chest 2 View  08/09/2013   *RADIOLOGY REPORT*  Clinical Data: Chest pain.  Chest tightness.  Cough.  Congestion.  CHEST - 2 VIEW  Comparison: 05/22/2013 and other priors.  Findings: Prominent pericardial fat pad adjacent to the cardiac apex.  Coronary artery atherosclerosis versus stent noted on the lateral view, likely in the left anterior descending coronary artery.  There is no airspace disease.  No pleural effusion.  No pneumothorax.  Cardiopericardial silhouette is within normal limits.  Surgical clips in the thoracic inlet compatible with thyroidectomy.  IMPRESSION: No active cardiopulmonary disease.   Original Report Authenticated By: Andreas Newport, M.D.   1. Chest pain     MDM  68 year old female with significant PMH of CAD, has had left heart cath 1 year ago and has been followed by cardiology.  Patient presents with chest pain relieved by aspirin. -EKG shows no significant ST changes -Will rule out ACS with CEs, first neg, will obtain second. -CBC wnl -BMet- no acute changes. -CXR- no acute process Second Troponin was negative. No further episdoes of chest pain. Patient does report that she takes Pepcid for heartburn but has run out and she is currently awaiting her 90 day mail order  prescription.  ACS workup has been negative and patient had left heart cath 1 year ago as well as regular cardiac follow up, I will discharge the patient to follow up with her Cardiologist for her episodes of chest pain.  In addition I will prescribe her a months prescription of her regularly prescribed pecid so she does not have to wait for her mail order prescription to arrive. Patient given instructions to return to ED for worsening Chest pain.  Carlynn Purl,  DO 08/09/13 2033

## 2013-08-09 NOTE — ED Notes (Signed)
MD at bedside. 

## 2013-08-11 NOTE — ED Provider Notes (Signed)
Medical screening examination/treatment/procedure(s) were conducted as a shared visit with non-physician practitioner(s) and myself.  I personally evaluated the patient during the encounter  Pt examined.  EKG reviewed.  atypical pain.  Normal enzymes.  Will follow up with PCP and cardiologist.  Claudean Kinds, MD 08/11/13 [phone]

## 2013-08-13 ENCOUNTER — Other Ambulatory Visit: Payer: Self-pay | Admitting: *Deleted

## 2013-08-14 ENCOUNTER — Other Ambulatory Visit: Payer: Self-pay

## 2013-08-20 ENCOUNTER — Encounter: Payer: Self-pay | Admitting: Internal Medicine

## 2013-08-20 ENCOUNTER — Ambulatory Visit (INDEPENDENT_AMBULATORY_CARE_PROVIDER_SITE_OTHER): Payer: Medicare Other | Admitting: Internal Medicine

## 2013-08-20 VITALS — BP 116/60 | HR 82 | Ht 64.0 in | Wt 192.0 lb

## 2013-08-20 DIAGNOSIS — E785 Hyperlipidemia, unspecified: Secondary | ICD-10-CM

## 2013-08-20 NOTE — Progress Notes (Signed)
HPI Patient is a 67 yo with a history of CAD Underwent LHC on 07/17/12 Patent RCA stent LCA with collaterals to R acute marginal were unchanged. Subtotal occlusion of acute marginal (exits mid stent). Recomm for medical Rx   I saw her in clinic back in [place] She was seen in ER a few days ago.  Had a few episodes of sharp substernal CP  Not pleuritic  Not associated with activity  Different from previous angina  Sent out of ER  Continues to have chest pressure at other times  Exacerbated at stress.    No Known Allergies  Current Outpatient Prescriptions  Medication Sig Dispense Refill  . amLODipine (NORVASC) 10 MG tablet Take 1 tablet (10 mg total) by mouth daily.  90 tablet  3  . aspirin EC 81 MG tablet Take 81 mg by mouth daily.        . calcitRIOL (ROCALTROL) 0.25 MCG capsule Take 3 capsules (0.75 mcg total) by mouth daily.  360 capsule  3  . calcium carbonate (TUMS - DOSED IN MG ELEMENTAL CALCIUM) 500 MG chewable tablet Chew 4 tablets by mouth 2 (two) times daily.       . clopidogrel (PLAVIX) 75 MG tablet Take 1 tablet (75 mg total) by mouth daily with breakfast.  90 tablet  3  . CYANOCOBALAMIN PO Take 1 tablet by mouth daily.      . cyclobenzaprine (FLEXERIL) 5 MG tablet Take 5 mg by mouth at bedtime as needed. For muscle spasms      . isosorbide mononitrate (IMDUR) 60 MG 24 hr tablet Take 1 tablet (60 mg total) by mouth daily.  90 tablet  3  . levothyroxine (SYNTHROID, LEVOTHROID) 125 MCG tablet Take 1 tablet (125 mcg total) by mouth daily.  90 tablet  3  . methocarbamol (ROBAXIN) 500 MG tablet Take 1 tablet (500 mg total) by mouth 4 (four) times daily as needed.  90 tablet  1  . nitroGLYCERIN (NITROSTAT) 0.4 MG SL tablet Place 1 tablet (0.4 mg total) under the tongue every 5 (five) minutes as needed for chest pain.  25 tablet  3  . rosuvastatin (CRESTOR) 20 MG tablet Take 1 tablet (20 mg total) by mouth daily.  30 tablet  5  . traMADol (ULTRAM) 50 MG tablet Take 1 tablet (50 mg total) by  mouth at bedtime as needed for pain.  30 tablet  1  . vitamin D, CHOLECALCIFEROL, 400 UNITS tablet Take 1 tablet (400 Units total) by mouth 2 (two) times daily.  60 tablet  1   No current facility-administered medications for this visit.    Past Medical History  Diagnosis Date  . GERD (gastroesophageal reflux disease)     occasional  . Hyperlipidemia   . Hypertension     Echocardiogram 11/19/11: Difficult acoustic windows, EF 60-65%, normal LV wall thickness, grade 1 diastolic dysfunction  . Vertigo   . Tubular adenoma of colon 06/2011  . Colitis   . Multinodular thyroid     Goiter s/p thyroidectomy in 2007 with post-op  hypocalcemia and post-op hypothyroidism/hypoparathyroidism with hypocalcemia  . CAD (coronary artery disease)     a) NSTEMI 10/2011: LHC 11/19/11: pLAD 30%, oOM 60%, AVCFX 30%, CFX after OM2 30%, pRCA 60 and 70%, then 99%, AM 80-90% with TIMI 3 flow.  PCI: Promus DES x 2 to RCA.  Marland Kitchen Post-surgical hypothyroidism   . Hypoparathyroidism   . Palpitations     a) 03/2012 - patient set up for event  monitor but did not wear correctly -  she declined wearing a repeat monitor  . Unspecified constipation 05/31/2013  . Back pain 05/31/2013  . Nocturia 05/31/2013  . Neck pain on right side 07/13/2013    Past Surgical History  Procedure Laterality Date  . Colonoscopy  Aenomatous polyps    07/05/2011  . Total thyroidectomy  2007    GOITER  . Shoulder arthroscopy w/ rotator cuff repair      right    Family History  Problem Relation Age of Onset  . Colon cancer Brother   . Cancer Brother     COLON  . Hypertension Father   . Diabetes Neg Hx   . Prostate cancer Neg Hx   . Heart disease Father   . Breast cancer Neg Hx     History   Social History  . Marital Status: Single    Spouse Name: N/A    Number of Children: N/A  . Years of Education: N/A   Occupational History  . Not on file.   Social History Main Topics  . Smoking status: Never Smoker   . Smokeless tobacco:  Never Used  . Alcohol Use: No  . Drug Use: No  . Sexual Activity: Yes   Other Topics Concern  . Not on file   Social History Narrative  . No narrative on file    Review of Systems:  All systems reviewed.  They are negative to the above problem except as previously stated.  Vital Signs: BP 116/60  Pulse 82  Ht 5\' 4"  (1.626 m)  Wt 192 lb (87.091 kg)  BMI 32.94 kg/m2  Physical Exam Patient is in NAD HEENT:  Normocephalic, atraumatic. EOMI, PERRLA.  Neck: JVP is normal.  No bruits.  Lungs: clear to auscultation. No rales no wheezes.  Heart: Regular rate and rhythm. Normal S1, S2. No S3.   No significant murmurs. PMI not displaced.  Abdomen:  Supple, nontender. Normal bowel sounds. No masses. No hepatomegaly.  Extremities:   Good distal pulses throughout. No lower extremity edema.  Musculoskeletal :moving all extremities.  Neuro:   alert and oriented x3.  CN II-XII grossly intact.   Assessment and Plan:  1.  CAD  I am not convinced recently spells of sharp pain represent angina.  Would keep on same regimen.    2.  HL  WIll check fasting lipids  Patient has eatten minimally today.  3.  HTN  Good control.  Tentative f/u in 6 months.

## 2013-08-20 NOTE — Patient Instructions (Signed)
Your physician wants you to follow-up in: 6 months You will receive a reminder letter in the mail two months in advance. If you don't receive a letter, please call our office to schedule the follow-up appointment.   Your physician recommends that you return for a FASTING lipid profile: today lipid only

## 2013-08-21 LAB — LIPID PANEL
HDL: 43.8 mg/dL (ref >39.00)
LDL Cholesterol: 66 mg/dL (ref 0–99)
Total CHOL/HDL Ratio: 3
Triglycerides: 142 mg/dL (ref 0.0–149.0)
VLDL: 28.4 mg/dL (ref 0.0–40.0)

## 2013-08-27 ENCOUNTER — Telehealth: Payer: Self-pay | Admitting: Internal Medicine

## 2013-08-27 ENCOUNTER — Telehealth: Payer: Self-pay

## 2013-08-27 NOTE — Telephone Encounter (Deleted)
error 

## 2013-08-27 NOTE — Telephone Encounter (Signed)
Per Dr. [name] ok for pt to have the brand name Mylo Red RN

## 2013-09-01 NOTE — Telephone Encounter (Signed)
I am confused about what the patient is asking for. Does she want brand name?

## 2013-09-02 NOTE — Telephone Encounter (Signed)
Elon Jester [facility] will no longer carry levothyroxine which is the gerenic brand so she need  A note faxed to them to say it is ok for her to use brand name

## 2013-09-08 ENCOUNTER — Other Ambulatory Visit (INDEPENDENT_AMBULATORY_CARE_PROVIDER_SITE_OTHER): Payer: Medicare Other

## 2013-09-08 DIAGNOSIS — E892 Postprocedural hypoparathyroidism: Secondary | ICD-10-CM

## 2013-09-08 DIAGNOSIS — E039 Hypothyroidism, unspecified: Secondary | ICD-10-CM

## 2013-09-08 DIAGNOSIS — E209 Hypoparathyroidism, unspecified: Secondary | ICD-10-CM

## 2013-09-08 LAB — BASIC METABOLIC PANEL
BUN: 22 mg/dL (ref 6–23)
Calcium: 8.4 mg/dL (ref 8.4–10.5)
Creatinine, Ser: 1.2 mg/dL (ref 0.4–1.2)
GFR: 50.09 mL/min — ABNORMAL LOW (ref >60.00)
Glucose, Bld: 91 mg/dL (ref 70–99)

## 2013-09-08 LAB — T4, FREE: Free T4: 1.07 ng/dL (ref 0.60–1.60)

## 2013-09-10 ENCOUNTER — Other Ambulatory Visit: Payer: Medicare Other

## 2013-09-14 ENCOUNTER — Ambulatory Visit (INDEPENDENT_AMBULATORY_CARE_PROVIDER_SITE_OTHER): Payer: Medicare Other | Admitting: Endocrinology

## 2013-09-14 ENCOUNTER — Encounter: Payer: Self-pay | Admitting: Endocrinology

## 2013-09-14 VITALS — BP 110/54 | HR 89 | Temp 98.5°F | Ht 64.0 in | Wt 191.1 lb

## 2013-09-14 DIAGNOSIS — E892 Postprocedural hypoparathyroidism: Secondary | ICD-10-CM

## 2013-09-14 DIAGNOSIS — Z23 Encounter for immunization: Secondary | ICD-10-CM

## 2013-09-14 DIAGNOSIS — E039 Hypothyroidism, unspecified: Secondary | ICD-10-CM

## 2013-09-14 DIAGNOSIS — E209 Hypoparathyroidism, unspecified: Secondary | ICD-10-CM

## 2013-09-14 MED ORDER — LEVOTHYROXINE SODIUM 125 MCG PO TABS: 125.0000 ug | ORAL_TABLET | Freq: Every day | ORAL | Status: DC

## 2013-09-14 NOTE — Patient Instructions (Addendum)
Same doses of Calcitiol

## 2013-09-14 NOTE — Progress Notes (Signed)
Patient ID: [name], female   DOB: 10/11/1946, 67 y.o.   MRN: [id number]  CHIEF complaint: Low calcium  History of Present Illness:  Past history: Patient had subtotal thyroidectomy in 2007 because of multiple nodules which were benign Postoperatively the patient had hoarseness along with hypocalcemia. She had symptoms of numbness all over with the hypocalcemia which improved with  calcitriol and calcium supplements She also has had periodic leg cramps when she has a low calcium She believes she was taking a total of 4 capsules of calcitriol 0.25 mcg until about 2 years ago and the dose was reduced for unknown reasons  Recent history: The patient's calcium levels are as below. She has had a tendency to hypocalcemia since 04/2012 Her calcitriol was increased to 0.75 mcg a day when she was having symptoms of numbness in her hands and face. She also has been compliant with taking 2000 mg of calcium daily Since her calcitriol was increased he has less symptoms and is doing fairly well now  Lab Results  Component Value Date   CALCIUM 8.4 09/08/2013   CALCIUM 8.3* 08/09/2013   CALCIUM 8.1* 07/16/2013   CALCIUM 8.1* 07/16/2013   CALCIUM 8.8 07/13/2013   CALCIUM 7.6* 05/28/2013   CALCIUM 8.1* 11/22/2012   CALCIUM 8.1* 08/11/2012   CALCIUM 8.7 07/17/2012   CALCIUM 8.2* 06/05/2012   CALCIUM 7.9* 05/23/2012    Past Medical History  Diagnosis Date  . GERD (gastroesophageal reflux disease)     occasional  . Hyperlipidemia   . Hypertension     Echocardiogram 11/19/11: Difficult acoustic windows, EF 60-65%, normal LV wall thickness, grade 1 diastolic dysfunction  . Vertigo   . Tubular adenoma of colon 06/2011  . Colitis   . Multinodular thyroid     Goiter s/p thyroidectomy in 2007 with post-op  hypocalcemia and post-op hypothyroidism/hypoparathyroidism with hypocalcemia  . CAD (coronary artery disease)     a) NSTEMI 10/2011: LHC 11/19/11: pLAD 30%, oOM 60%, AVCFX 30%, CFX after OM2 30%, pRCA 60  and 70%, then 99%, AM 80-90% with TIMI 3 flow.  PCI: Promus DES x 2 to RCA.  Marland Kitchen Post-surgical hypothyroidism   . Hypoparathyroidism   . Palpitations     a) 03/2012 - patient set up for event monitor but did not wear correctly -  she declined wearing a repeat monitor  . Unspecified constipation 05/31/2013  . Back pain 05/31/2013  . Nocturia 05/31/2013  . Neck pain on right side 07/13/2013    Past Surgical History  Procedure Laterality Date  . Colonoscopy  Aenomatous polyps    07/05/2011  . Total thyroidectomy  2007    GOITER  . Shoulder arthroscopy w/ rotator cuff repair      right    Family History  Problem Relation Age of Onset  . Colon cancer Brother   . Cancer Brother     COLON  . Hypertension Father   . Diabetes Neg Hx   . Prostate cancer Neg Hx   . Heart disease Father   . Breast cancer Neg Hx     Social History:  reports that she has never smoked. She has never used smokeless tobacco. She reports that she does not drink alcohol or use illicit drugs.  Allergies: No Known Allergies    Medication List       This list is accurate as of: 09/14/13  4:12 PM.  Always use your most recent med list.  amLODipine 10 MG tablet  Commonly known as:  NORVASC  Take 1 tablet (10 mg total) by mouth daily.     aspirin EC 81 MG tablet  Take 81 mg by mouth daily.     calcitRIOL 0.25 MCG capsule  Commonly known as:  ROCALTROL  Take 3 capsules (0.75 mcg total) by mouth daily.     calcium carbonate 500 MG chewable tablet  Commonly known as:  TUMS - dosed in mg elemental calcium  Chew 4 tablets by mouth 2 (two) times daily.     clopidogrel 75 MG tablet  Commonly known as:  PLAVIX  Take 1 tablet (75 mg total) by mouth daily with breakfast.     CYANOCOBALAMIN PO  Take 1 tablet by mouth daily.     cyclobenzaprine 5 MG tablet  Commonly known as:  FLEXERIL  Take 5 mg by mouth at bedtime as needed. For muscle spasms     isosorbide mononitrate 60 MG 24 hr tablet   Commonly known as:  IMDUR  Take 1 tablet (60 mg total) by mouth daily.     levothyroxine 125 MCG tablet  Commonly known as:  SYNTHROID, LEVOTHROID  Take 1 tablet (125 mcg total) by mouth daily.     methocarbamol 500 MG tablet  Commonly known as:  ROBAXIN  Take 1 tablet (500 mg total) by mouth 4 (four) times daily as needed.     nitroGLYCERIN 0.4 MG SL tablet  Commonly known as:  NITROSTAT  Place 1 tablet (0.4 mg total) under the tongue every 5 (five) minutes as needed for chest pain.     rosuvastatin 20 MG tablet  Commonly known as:  CRESTOR  Take 1 tablet (20 mg total) by mouth daily.     traMADol 50 MG tablet  Commonly known as:  ULTRAM  Take 1 tablet (50 mg total) by mouth at bedtime as needed for pain.     vitamin D (CHOLECALCIFEROL) 400 UNITS tablet  Take 1 tablet (400 Units total) by mouth 2 (two) times daily.        Appointment on 09/08/2013  Component Date Value Range Status  . Free T4 09/08/2013 1.07  0.60 - 1.60 ng/dL Final  . TSH 16/09/9603 1.00  0.35 - 5.50 uIU/mL Final  . Sodium 09/08/2013 143  135 - 145 mEq/L Final  . Potassium 09/08/2013 4.0  3.5 - 5.1 mEq/L Final  . Chloride 09/08/2013 105  96 - 112 mEq/L Final  . CO2 09/08/2013 31  19 - 32 mEq/L Final  . Glucose, Bld 09/08/2013 91  70 - 99 mg/dL Final  . BUN 54/08/8118 22  6 - 23 mg/dL Final  . Creatinine, Ser 09/08/2013 1.2  0.4 - 1.2 mg/dL Final  . Calcium 14/78/2956 8.4  8.4 - 10.5 mg/dL Final  . GFR 21/30/8657 50.09* >60.00 mL/min Final     REVIEW OF SYSTEMS:           Skin: No rash or infections     Thyroid:  No cold or heat intolerance, recent fatigue. She has been hypothyroid since her surgery and is compliant with her medication  Lab Results  Component Value Date   TSH 1.00 09/08/2013     Hair loss: She has complained of this for a few years, no etiology found        PHYSICAL EXAM:  BP 110/54  Pulse 89  Temp(Src) 98.5 F (36.9 C) (Oral)  Ht 5\' 4"  (1.626 m)  Wt 191 lb 1.6 oz  (  86.682 kg)  BMI 32.79 kg/m2  SpO2 97%  GENERAL: Well-built and nourished   NEUROLOGICAL: Chvostek negative .Reflexes arenormal at biceps.   ASSESSMENT:    Hypoparathyroidism, postoperative with persistent hypocalcemia She is currently taking a total of 0.75 mcg of calcitriol daily along with about 2 g of calcium With the increase in calcitriol she has less symptoms and her calcium is back to normal  She is doing better with switching to Caltrate instead of TUMS and increasing her total calcium intake  HYPOTHYROIDISM: This has been fairly well regulated with normal TSH, should repeat this in 6 months   PLAN:   No change in regimen  Cristen Murcia 09/14/2013, 4:12 PM

## 2013-09-18 ENCOUNTER — Other Ambulatory Visit: Payer: Self-pay

## 2013-09-18 DIAGNOSIS — E785 Hyperlipidemia, unspecified: Secondary | ICD-10-CM

## 2013-09-18 DIAGNOSIS — E039 Hypothyroidism, unspecified: Secondary | ICD-10-CM

## 2013-09-18 MED ORDER — ROSUVASTATIN CALCIUM 20 MG PO TABS: 20.0000 mg | ORAL_TABLET | Freq: Every day | ORAL | Status: DC

## 2013-09-18 MED ORDER — LEVOTHYROXINE SODIUM 125 MCG PO TABS: 125.0000 ug | ORAL_TABLET | Freq: Every day | ORAL | Status: DC

## 2013-10-12 ENCOUNTER — Ambulatory Visit: Payer: Medicare Other | Admitting: Family Medicine

## 2013-11-02 ENCOUNTER — Telehealth: Payer: Self-pay | Admitting: *Deleted

## 2013-11-02 ENCOUNTER — Ambulatory Visit (HOSPITAL_COMMUNITY): Payer: Medicare Other | Attending: Internal Medicine

## 2013-11-02 DIAGNOSIS — I10 Essential (primary) hypertension: Secondary | ICD-10-CM | POA: Insufficient documentation

## 2013-11-02 DIAGNOSIS — I658 Occlusion and stenosis of other precerebral arteries: Secondary | ICD-10-CM | POA: Insufficient documentation

## 2013-11-02 DIAGNOSIS — E785 Hyperlipidemia, unspecified: Secondary | ICD-10-CM | POA: Insufficient documentation

## 2013-11-02 DIAGNOSIS — I6529 Occlusion and stenosis of unspecified carotid artery: Secondary | ICD-10-CM

## 2013-11-02 DIAGNOSIS — I2510 Atherosclerotic heart disease of native coronary artery without angina pectoris: Secondary | ICD-10-CM | POA: Insufficient documentation

## 2013-11-02 NOTE — Telephone Encounter (Signed)
patient requests Crestor samples. samples left at front desk.

## 2013-11-06 ENCOUNTER — Telehealth: Payer: Self-pay | Admitting: Internal Medicine

## 2013-11-06 NOTE — Telephone Encounter (Signed)
New Problem:  Pt states she would like her recent test results. Please advise

## 2013-11-06 NOTE — Telephone Encounter (Signed)
Spoke with pt, aware of carotid results 

## 2013-11-12 ENCOUNTER — Other Ambulatory Visit: Payer: Self-pay | Admitting: Internal Medicine

## 2013-12-04 ENCOUNTER — Other Ambulatory Visit: Payer: Self-pay | Admitting: Internal Medicine

## 2013-12-07 ENCOUNTER — Other Ambulatory Visit: Payer: Self-pay | Admitting: *Deleted

## 2013-12-07 MED ORDER — FAMOTIDINE 20 MG PO TABS: 20.0000 mg | ORAL_TABLET | Freq: Two times a day (BID) | ORAL | Status: DC

## 2013-12-25 ENCOUNTER — Telehealth: Payer: Self-pay | Admitting: Internal Medicine

## 2013-12-25 ENCOUNTER — Other Ambulatory Visit: Payer: Self-pay | Admitting: Gynecology

## 2013-12-25 DIAGNOSIS — Z1231 Encounter for screening mammogram for malignant neoplasm of breast: Secondary | ICD-10-CM

## 2013-12-25 NOTE — Telephone Encounter (Signed)
New Message  Pt requests a letter to confirm with employment that she is excused from working the night shift after 7 pm // Please call back to discuss.

## 2013-12-25 NOTE — Telephone Encounter (Signed)
I spoke with the pt and she needs a letter from Dr Harrington Challenger stating that she cannot work night shift after 7 PM.  Dr Harrington Challenger did a letter for the pt on July 14, 2012 and she needs this letter again for her employer. The pt would like to be called to pick-up letter when it is ready. I made the pt aware that Dr Harrington Challenger will be back in the office next week.  I will forward this result to Fredia Beets RN and Dr Harrington Challenger to address letter next week.

## 2013-12-28 ENCOUNTER — Encounter: Payer: Self-pay | Admitting: *Deleted

## 2013-12-28 ENCOUNTER — Encounter: Payer: Self-pay | Admitting: Internal Medicine

## 2013-12-28 NOTE — Telephone Encounter (Signed)
Spoke with pt, aware letter is at the front desk for pick up. Pt also requested samples of crestor 20. These were also placed at the front desk.

## 2014-01-04 ENCOUNTER — Ambulatory Visit (HOSPITAL_COMMUNITY): Payer: Medicare Other

## 2014-01-07 ENCOUNTER — Other Ambulatory Visit: Payer: Medicare Other

## 2014-01-11 ENCOUNTER — Ambulatory Visit: Payer: Medicare Other | Admitting: Endocrinology

## 2014-01-11 ENCOUNTER — Ambulatory Visit (HOSPITAL_COMMUNITY)
Admission: RE | Admit: 2014-01-11 | Discharge: 2014-01-11 | Disposition: A | Discharge status: Home / Self Care | Payer: Medicare Other | Source: Ambulatory Visit | Attending: Gynecology | Admitting: Gynecology

## 2014-01-11 DIAGNOSIS — Z1231 Encounter for screening mammogram for malignant neoplasm of breast: Secondary | ICD-10-CM | POA: Insufficient documentation

## 2014-01-14 ENCOUNTER — Ambulatory Visit: Payer: Medicare Other | Admitting: Endocrinology

## 2014-01-22 ENCOUNTER — Other Ambulatory Visit (INDEPENDENT_AMBULATORY_CARE_PROVIDER_SITE_OTHER): Payer: Medicare Other

## 2014-01-22 DIAGNOSIS — E209 Hypoparathyroidism, unspecified: Secondary | ICD-10-CM

## 2014-01-22 DIAGNOSIS — E892 Postprocedural hypoparathyroidism: Secondary | ICD-10-CM

## 2014-01-22 DIAGNOSIS — E039 Hypothyroidism, unspecified: Secondary | ICD-10-CM

## 2014-01-22 LAB — BASIC METABOLIC PANEL
BUN: 32 mg/dL — ABNORMAL HIGH (ref 6–23)
CO2: 28 meq/L (ref 19–32)
Calcium: 9 mg/dL (ref 8.4–10.5)
Chloride: 105 mEq/L (ref 96–112)
Creatinine, Ser: 1.3 mg/dL — ABNORMAL HIGH (ref 0.4–1.2)
GFR: 45.45 mL/min — AB (ref >60.00)
GLUCOSE: 82 mg/dL (ref 70–99)
POTASSIUM: 3.9 meq/L (ref 3.5–5.1)
SODIUM: 142 meq/L (ref 135–145)

## 2014-01-22 LAB — TSH: TSH: 0.14 u[IU]/mL — ABNORMAL LOW (ref 0.35–5.50)

## 2014-01-25 ENCOUNTER — Ambulatory Visit: Payer: Medicare Other | Admitting: Endocrinology

## 2014-02-05 ENCOUNTER — Encounter: Payer: Self-pay | Admitting: Endocrinology

## 2014-02-05 ENCOUNTER — Ambulatory Visit (INDEPENDENT_AMBULATORY_CARE_PROVIDER_SITE_OTHER): Payer: Medicare Other | Admitting: Endocrinology

## 2014-02-05 VITALS — BP 122/70 | HR 60 | Temp 98.3°F | Resp 14 | Ht 64.0 in | Wt 191.0 lb

## 2014-02-05 DIAGNOSIS — E039 Hypothyroidism, unspecified: Secondary | ICD-10-CM

## 2014-02-05 DIAGNOSIS — N182 Chronic kidney disease, stage 2 (mild): Secondary | ICD-10-CM

## 2014-02-05 DIAGNOSIS — I10 Essential (primary) hypertension: Secondary | ICD-10-CM

## 2014-02-05 DIAGNOSIS — E892 Postprocedural hypoparathyroidism: Secondary | ICD-10-CM

## 2014-02-05 DIAGNOSIS — E209 Hypoparathyroidism, unspecified: Secondary | ICD-10-CM

## 2014-02-05 MED ORDER — LEVOTHYROXINE SODIUM 112 MCG PO TABS: 112.0000 ug | ORAL_TABLET | Freq: Every day | ORAL | Status: DC

## 2014-02-05 NOTE — Patient Instructions (Signed)
Synthroid 112ug daily 

## 2014-02-05 NOTE — Progress Notes (Signed)
Patient ID: [name], female   DOB: Oct 15, 1946, 68 y.o.   MRN: [id number]   CHIEF complaint: Followup of hypoparathyroidism  History of Present Illness:  Past history: Patient had subtotal thyroidectomy in 2007 because of multiple nodules which were benign Postoperatively the patient had hoarseness along with hypocalcemia. She had symptoms of numbness all over with the hypocalcemia which improved with  calcitriol and calcium supplements She also has had periodic leg cramps when she has a low calcium She was taking a total of 2 mcg calcitriol and subsequently dose was reduced to 0.5 mcg  Recent history: She  had a tendency to hypocalcemia since 04/2012 Her calcitriol was increased to 0.75 mcg a day when she was having symptoms of numbness in her hands and face.  She also has been compliant with taking 2000 mg of calcium daily in the form of Caltrate Since her calcitriol was increased to 0.75 mcg has no symptoms of numbness in her hands and face and no leg cramps The patient's calcium levels are as below.   Lab Results  Component Value Date   CALCIUM 9.0 01/22/2014   CALCIUM 8.4 09/08/2013   CALCIUM 8.3* 08/09/2013   CALCIUM 8.1* 07/16/2013   CALCIUM 8.1* 07/16/2013   CALCIUM 8.8 07/13/2013   CALCIUM 7.6* 05/28/2013   CALCIUM 8.1* 11/22/2012   CALCIUM 8.1* 08/11/2012   CALCIUM 8.7 07/17/2012   CALCIUM 8.2* 06/05/2012   No visits with results within 2 Week(s) from this visit. Latest known visit with results is:  Appointment on 01/22/2014  Component Date Value Ref Range Status  . TSH 01/22/2014 0.14* 0.35 - 5.50 uIU/mL Final  . Sodium 01/22/2014 142  135 - 145 mEq/L Final  . Potassium 01/22/2014 3.9  3.5 - 5.1 mEq/L Final  . Chloride 01/22/2014 105  96 - 112 mEq/L Final  . CO2 01/22/2014 28  19 - 32 mEq/L Final  . Glucose, Bld 01/22/2014 82  70 - 99 mg/dL Final  . BUN 01/22/2014 32* 6 - 23 mg/dL Final  . Creatinine, Ser 01/22/2014 1.3* 0.4 - 1.2 mg/dL Final  . Calcium 01/22/2014 9.0   8.4 - 10.5 mg/dL Final  . GFR 01/22/2014 45.45* >60.00 mL/min Final    HYPOTHYROIDISM:  She has postsurgical hypothyroidism. No cold or heat intolerance, recent fatigue.  Also no palpitations or shakiness She has been  is compliant with her medication  Lab Results  Component Value Date   TSH 0.14* 01/22/2014     Past Medical History  Diagnosis Date  . GERD (gastroesophageal reflux disease)     occasional  . Hyperlipidemia   . Hypertension     Echocardiogram 11/19/11: Difficult acoustic windows, EF 60-65%, normal LV wall thickness, grade 1 diastolic dysfunction  . Vertigo   . Tubular adenoma of colon 06/2011  . Colitis   . Multinodular thyroid     Goiter s/p thyroidectomy in 2007 with post-op  hypocalcemia and post-op hypothyroidism/hypoparathyroidism with hypocalcemia  . CAD (coronary artery disease)     a) NSTEMI 10/2011: LHC 11/19/11: pLAD 30%, oOM 60%, AVCFX 30%, CFX after OM2 30%, pRCA 60 and 70%, then 99%, AM 80-90% with TIMI 3 flow.  PCI: Promus DES x 2 to RCA.  Marland Kitchen Post-surgical hypothyroidism   . Hypoparathyroidism   . Palpitations     a) 03/2012 - patient set up for event monitor but did not wear correctly -  she declined wearing a repeat monitor  . Unspecified constipation 05/31/2013  . Back pain 05/31/2013  .  Nocturia 05/31/2013  . Neck pain on right side 07/13/2013    Past Surgical History  Procedure Laterality Date  . Colonoscopy  Aenomatous polyps    07/05/2011  . Total thyroidectomy  2007    GOITER  . Shoulder arthroscopy w/ rotator cuff repair      right    Family History  Problem Relation Age of Onset  . Colon cancer Brother   . Cancer Brother     COLON  . Hypertension Father   . Diabetes Neg Hx   . Prostate cancer Neg Hx   . Heart disease Father   . Breast cancer Neg Hx     Social History:  reports that she has never smoked. She has never used smokeless tobacco. She reports that she does not drink alcohol or use illicit drugs.  Allergies: No Known  Allergies    Medication List       This list is accurate as of: 02/05/14  9:32 AM.  Always use your most recent med list.               amLODipine 10 MG tablet  Commonly known as:  NORVASC  Take 1 tablet (10 mg total) by mouth daily.     aspirin EC 81 MG tablet  Take 81 mg by mouth daily.     calcitRIOL 0.25 MCG capsule  Commonly known as:  ROCALTROL  Take 3 capsules (0.75 mcg total) by mouth daily.     calcium carbonate 500 MG chewable tablet  Commonly known as:  TUMS - dosed in mg elemental calcium  Chew 4 tablets by mouth 2 (two) times daily.     clopidogrel 75 MG tablet  Commonly known as:  PLAVIX  Take 1 tablet (75 mg total) by mouth daily with breakfast.     CYANOCOBALAMIN PO  Take 1 tablet by mouth daily.     cyclobenzaprine 5 MG tablet  Commonly known as:  FLEXERIL  Take 5 mg by mouth at bedtime as needed. For muscle spasms     famotidine 20 MG tablet  Commonly known as:  PEPCID  Take 1 tablet (20 mg total) by mouth 2 (two) times daily.     isosorbide mononitrate 60 MG 24 hr tablet  Commonly known as:  IMDUR  Take 1 tablet (60 mg total) by mouth daily.     levothyroxine 125 MCG tablet  Commonly known as:  SYNTHROID, LEVOTHROID  Take 1 tablet (125 mcg total) by mouth daily.     methocarbamol 500 MG tablet  Commonly known as:  ROBAXIN  Take 1 tablet (500 mg total) by mouth 4 (four) times daily as needed.     nitroGLYCERIN 0.4 MG SL tablet  Commonly known as:  NITROSTAT  Place 1 tablet (0.4 mg total) under the tongue every 5 (five) minutes as needed for chest pain.     rosuvastatin 20 MG tablet  Commonly known as:  CRESTOR  Take 1 tablet (20 mg total) by mouth daily.     traMADol 50 MG tablet  Commonly known as:  ULTRAM  Take 1 tablet (50 mg total) by mouth at bedtime as needed for pain.     vitamin D (CHOLECALCIFEROL) 400 UNITS tablet  Take 1 tablet (400 Units total) by mouth 2 (two) times daily.        LABS:  No visits with results  within 1 Week(s) from this visit. Latest known visit with results is:  Appointment on 01/22/2014  Component Date Value  Ref Range Status  . TSH 01/22/2014 0.14* 0.35 - 5.50 uIU/mL Final  . Sodium 01/22/2014 142  135 - 145 mEq/L Final  . Potassium 01/22/2014 3.9  3.5 - 5.1 mEq/L Final  . Chloride 01/22/2014 105  96 - 112 mEq/L Final  . CO2 01/22/2014 28  19 - 32 mEq/L Final  . Glucose, Bld 01/22/2014 82  70 - 99 mg/dL Final  . BUN 01/22/2014 32* 6 - 23 mg/dL Final  . Creatinine, Ser 01/22/2014 1.3* 0.4 - 1.2 mg/dL Final  . Calcium 01/22/2014 9.0  8.4 - 10.5 mg/dL Final  . GFR 01/22/2014 45.45* >60.00 mL/min Final     REVIEW OF SYSTEMS:               Had vertigo previously    Hair loss: She has complained of this for a few years, no etiology found  She has a high normal creatinine, relatively higher than before. Has a history of hypertension and CAD  Back, neck pain: on Tylenol and not any Advil/Aleve  History of hypercholesterolemia     PHYSICAL EXAM:  BP 122/70  Pulse 60  Temp(Src) 98.3 F (36.8 C)  Resp 14  Ht 5\' 4"  (1.626 m)  Wt 191 lb (86.637 kg)  BMI 32.77 kg/m2  SpO2 94%   NEUROLOGICAL: .Reflexes are normal at biceps. No peripheral edema   ASSESSMENT:    Hypoparathyroidism, postoperative with continued need for treatment with calcitriol and calcium She is currently taking a total of 0.75 mcg of calcitriol daily along with  2 g of calcium With the increase in calcitriol she is now asymptomatic and her calcium is back to normal at 9.0 She'll continue same dose  HYPOTHYROIDISM: This is secondary to thyroidectomy. Currently her TSH is slightly low and she will reduce her dose to 112 mcg Reassured the patient that this will not cause her to gain weight  Mild increase in creatinine: She will discuss this with her PCP or cardiologist, etiology unclear  PLAN:     [name] 02/05/2014, 9:32 AM

## 2014-02-22 ENCOUNTER — Encounter: Payer: Self-pay | Admitting: Internal Medicine

## 2014-02-22 ENCOUNTER — Ambulatory Visit (INDEPENDENT_AMBULATORY_CARE_PROVIDER_SITE_OTHER): Payer: Medicare Other | Admitting: Internal Medicine

## 2014-02-22 VITALS — BP 116/62 | HR 60 | Ht 64.5 in | Wt 189.0 lb

## 2014-02-22 DIAGNOSIS — I10 Essential (primary) hypertension: Secondary | ICD-10-CM

## 2014-02-22 DIAGNOSIS — I2510 Atherosclerotic heart disease of native coronary artery without angina pectoris: Secondary | ICD-10-CM

## 2014-02-22 DIAGNOSIS — E785 Hyperlipidemia, unspecified: Secondary | ICD-10-CM

## 2014-02-22 NOTE — Progress Notes (Signed)
HPI Patient is a 68 yo with a history of CAD Underwent LHC on 07/17/12 Patent RCA stent LCA with collaterals to R acute marginal were unchanged. Subtotal occlusion of acute marginal (exits mid stent). Recomm for medical Rx   SInce last seen she has been doing OK from a cardiac standpoint.  Denies signif chest pain.  Breathing OK   Having problems swallowing since surgery  Has some aspiration  No Known Allergies  Current Outpatient Prescriptions  Medication Sig Dispense Refill  . amLODipine (NORVASC) 10 MG tablet Take 1 tablet (10 mg total) by mouth daily.  90 tablet  3  . aspirin EC 81 MG tablet Take 81 mg by mouth daily.        . calcitRIOL (ROCALTROL) 0.25 MCG capsule Take 3 capsules (0.75 mcg total) by mouth daily.  360 capsule  3  . calcium carbonate (TUMS - DOSED IN MG ELEMENTAL CALCIUM) 500 MG chewable tablet Chew 4 tablets by mouth 2 (two) times daily.       . clopidogrel (PLAVIX) 75 MG tablet Take 1 tablet (75 mg total) by mouth daily with breakfast.  90 tablet  3  . CYANOCOBALAMIN PO Take 1 tablet by mouth daily.      . cyclobenzaprine (FLEXERIL) 5 MG tablet Take 5 mg by mouth at bedtime as needed. For muscle spasms      . famotidine (PEPCID) 20 MG tablet Take 1 tablet (20 mg total) by mouth 2 (two) times daily.  180 tablet  0  . isosorbide mononitrate (IMDUR) 60 MG 24 hr tablet Take 1 tablet (60 mg total) by mouth daily.  90 tablet  3  . levothyroxine (SYNTHROID, LEVOTHROID) 112 MCG tablet Take 1 tablet (112 mcg total) by mouth daily.  90 tablet  3  . methocarbamol (ROBAXIN) 500 MG tablet Take 1 tablet (500 mg total) by mouth 4 (four) times daily as needed.  90 tablet  1  . nitroGLYCERIN (NITROSTAT) 0.4 MG SL tablet Place 1 tablet (0.4 mg total) under the tongue every 5 (five) minutes as needed for chest pain.  25 tablet  3  . rosuvastatin (CRESTOR) 20 MG tablet Take 1 tablet (20 mg total) by mouth daily.  30 tablet  5  . traMADol (ULTRAM) 50 MG tablet Take 1 tablet (50 mg total) by  mouth at bedtime as needed for pain.  30 tablet  1  . vitamin D, CHOLECALCIFEROL, 400 UNITS tablet Take 1 tablet (400 Units total) by mouth 2 (two) times daily.  60 tablet  1   No current facility-administered medications for this visit.    Past Medical History  Diagnosis Date  . GERD (gastroesophageal reflux disease)     occasional  . Hyperlipidemia   . Hypertension     Echocardiogram 11/19/11: Difficult acoustic windows, EF 60-65%, normal LV wall thickness, grade 1 diastolic dysfunction  . Vertigo   . Tubular adenoma of colon 06/2011  . Colitis   . Multinodular thyroid     Goiter s/p thyroidectomy in 2007 with post-op  hypocalcemia and post-op hypothyroidism/hypoparathyroidism with hypocalcemia  . CAD (coronary artery disease)     a) NSTEMI 10/2011: LHC 11/19/11: pLAD 30%, oOM 60%, AVCFX 30%, CFX after OM2 30%, pRCA 60 and 70%, then 99%, AM 80-90% with TIMI 3 flow.  PCI: Promus DES x 2 to RCA.  Marland Kitchen Post-surgical hypothyroidism   . Hypoparathyroidism   . Palpitations     a) 03/2012 - patient set up for event monitor but did not  wear correctly -  she declined wearing a repeat monitor  . Unspecified constipation 05/31/2013  . Back pain 05/31/2013  . Nocturia 05/31/2013  . Neck pain on right side 07/13/2013    Past Surgical History  Procedure Laterality Date  . Colonoscopy  Aenomatous polyps    07/05/2011  . Total thyroidectomy  2007    GOITER  . Shoulder arthroscopy w/ rotator cuff repair      right    Family History  Problem Relation Age of Onset  . Colon cancer Brother   . Cancer Brother     COLON  . Hypertension Father   . Diabetes Neg Hx   . Prostate cancer Neg Hx   . Heart disease Father   . Breast cancer Neg Hx     History   Social History  . Marital Status: Single    Spouse Name: N/A    Number of Children: N/A  . Years of Education: N/A   Occupational History  . Not on file.   Social History Main Topics  . Smoking status: Never Smoker   . Smokeless tobacco:  Never Used  . Alcohol Use: No  . Drug Use: No  . Sexual Activity: Yes   Other Topics Concern  . Not on file   Social History Narrative  . No narrative on file    Review of Systems:  All systems reviewed.  They are negative to the above problem except as previously stated.  Vital Signs: BP 116/62  Pulse 60  Ht 5' 4.5" (1.638 m)  Wt 189 lb (85.73 kg)  BMI 31.95 kg/m2  Physical Exam Patient is in NAD HEENT:  Normocephalic, atraumatic. EOMI, PERRLA.  Neck: JVP is normal.  No bruits.  Lungs: clear to auscultation. No rales no wheezes.  Heart: Regular rate and rhythm. Normal S1, S2. No S3.   No significant murmurs. PMI not displaced.  Abdomen:  Supple, nontender. Normal bowel sounds. No masses. No hepatomegaly.  Extremities:   Good distal pulses throughout. No lower extremity edema.  Musculoskeletal :moving all extremities.  Neuro:   alert and oriented x3.  CN II-XII grossly intact.   Assessment and Plan:  1.  CAD  Doing well  No complaints of angina.  Keep on same regimen  .    2.  HL   Lipid panel in August is very good.    3.  HTN  Good control.  Tentative f/u in 6 months.

## 2014-02-22 NOTE — Patient Instructions (Signed)
Your physician recommends that you continue on your current medications as directed. Please refer to the Current Medication list given to you today.  Samples of Crestor 20 mg given to you today.  Your physician wants you to follow-up in: 6 months with Dr. Harrington Challenger. You will receive a reminder letter in the mail two months in advance. If you don't receive a letter, please call our office to schedule the follow-up appointment.

## 2014-02-23 ENCOUNTER — Telehealth: Payer: Self-pay | Admitting: *Deleted

## 2014-02-23 ENCOUNTER — Telehealth: Payer: Self-pay | Admitting: Endocrinology

## 2014-02-23 NOTE — Telephone Encounter (Signed)
Patient said she saw her PCP yesterday and they said everything was fine, she wanted to know why you didn't lower her dose to 120 mcg, I explained to her that there was no such dose and she said she had taken it in the past.  I looked again and assured her that dose was not available. Please advise.

## 2014-02-23 NOTE — Telephone Encounter (Signed)
Confirm that she change from 125 Synthroid down to 112 Synthroid. This is a minimal change in should not make her feel worse. She may be having some problems with her blood pressure or other issues causing lightheadedness, needs to see PCP

## 2014-02-23 NOTE — Telephone Encounter (Signed)
Since the change in her meds, pt is so lightheaded that she cannot walk.  Please advise

## 2014-02-23 NOTE — Telephone Encounter (Signed)
Discussed with patient: From time to time she gets lightheaded. Also feels weak and sleepy Reassured her that reducing her thyroid dose from 125 down to 112 when her TSH was low would not make her feel bad. She needs to be evaluated by her PCP

## 2014-02-23 NOTE — Telephone Encounter (Signed)
Since the change in her meds, pt is so lightheaded that she cannot walk.

## 2014-03-15 ENCOUNTER — Other Ambulatory Visit: Payer: Self-pay | Admitting: Family Medicine

## 2014-03-15 ENCOUNTER — Encounter: Payer: Self-pay | Admitting: Family Medicine

## 2014-03-15 ENCOUNTER — Ambulatory Visit (INDEPENDENT_AMBULATORY_CARE_PROVIDER_SITE_OTHER): Payer: Medicare Other | Admitting: Family Medicine

## 2014-03-15 VITALS — BP 128/72 | HR 62 | Temp 97.9°F | Ht 64.0 in | Wt 193.1 lb

## 2014-03-15 DIAGNOSIS — M79609 Pain in unspecified limb: Secondary | ICD-10-CM

## 2014-03-15 DIAGNOSIS — E785 Hyperlipidemia, unspecified: Secondary | ICD-10-CM

## 2014-03-15 DIAGNOSIS — I2510 Atherosclerotic heart disease of native coronary artery without angina pectoris: Secondary | ICD-10-CM

## 2014-03-15 DIAGNOSIS — Z23 Encounter for immunization: Secondary | ICD-10-CM

## 2014-03-15 DIAGNOSIS — M79641 Pain in right hand: Secondary | ICD-10-CM

## 2014-03-15 DIAGNOSIS — M549 Dorsalgia, unspecified: Secondary | ICD-10-CM

## 2014-03-15 DIAGNOSIS — E039 Hypothyroidism, unspecified: Secondary | ICD-10-CM

## 2014-03-15 DIAGNOSIS — I10 Essential (primary) hypertension: Secondary | ICD-10-CM

## 2014-03-15 DIAGNOSIS — E876 Hypokalemia: Secondary | ICD-10-CM

## 2014-03-15 DIAGNOSIS — K219 Gastro-esophageal reflux disease without esophagitis: Secondary | ICD-10-CM

## 2014-03-15 DIAGNOSIS — M79642 Pain in left hand: Secondary | ICD-10-CM

## 2014-03-15 NOTE — Patient Instructions (Signed)
Can try Aspercreme or Salon Pas cream to hands Arthritis, Nonspecific Arthritis is inflammation of a joint. This usually means pain, redness, warmth or swelling are present. One or more joints may be involved. There are a number of types of arthritis. Your caregiver may not be able to tell what type of arthritis you have right away. CAUSES  The most common cause of arthritis is the wear and tear on the joint (osteoarthritis). This causes damage to the cartilage, which can break down over time. The knees, hips, back and neck are most often affected by this type of arthritis. Other types of arthritis and common causes of joint pain include:  Sprains and other injuries near the joint. Sometimes minor sprains and injuries cause pain and swelling that develop hours later.  Rheumatoid arthritis. This affects hands, feet and knees. It usually affects both sides of your body at the same time. It is often associated with chronic ailments, fever, weight loss and general weakness.  Crystal arthritis. Gout and pseudo gout can cause occasional acute severe pain, redness and swelling in the foot, ankle, or knee.  Infectious arthritis. Bacteria can get into a joint through a break in overlying skin. This can cause infection of the joint. Bacteria and viruses can also spread through the blood and affect your joints.  Drug, infectious and allergy reactions. Sometimes joints can become mildly painful and slightly swollen with these types of illnesses. SYMPTOMS   Pain is the main symptom.  Your joint or joints can also be red, swollen and warm or hot to the touch.  You may have a fever with certain types of arthritis, or even feel overall ill.  The joint with arthritis will hurt with movement. Stiffness is present with some types of arthritis. DIAGNOSIS  Your caregiver will suspect arthritis based on your description of your symptoms and on your exam. Testing may be needed to find the type of  arthritis:  Blood and sometimes urine tests.  X-ray tests and sometimes CT or MRI scans.  Removal of fluid from the joint (arthrocentesis) is done to check for bacteria, crystals or other causes. Your caregiver (or a specialist) will numb the area over the joint with a local anesthetic, and use a needle to remove joint fluid for examination. This procedure is only minimally uncomfortable.  Even with these tests, your caregiver may not be able to tell what kind of arthritis you have. Consultation with a specialist (rheumatologist) may be helpful. TREATMENT  Your caregiver will discuss with you treatment specific to your type of arthritis. If the specific type cannot be determined, then the following general recommendations may apply. Treatment of severe joint pain includes:  Rest.  Elevation.  Anti-inflammatory medication (for example, ibuprofen) may be prescribed. Avoiding activities that cause increased pain.  Only take over-the-counter or prescription medicines for pain and discomfort as recommended by your caregiver.  Cold packs over an inflamed joint may be used for 10 to 15 minutes every hour. Hot packs sometimes feel better, but do not use overnight. Do not use hot packs if you are diabetic without your caregiver's permission.  A cortisone shot into arthritic joints may help reduce pain and swelling.  Any acute arthritis that gets worse over the next 1 to 2 days needs to be looked at to be sure there is no joint infection. Long-term arthritis treatment involves modifying activities and lifestyle to reduce joint stress jarring. This can include weight loss. Also, exercise is needed to nourish the joint  cartilage and remove waste. This helps keep the muscles around the joint strong. HOME CARE INSTRUCTIONS   Do not take aspirin to relieve pain if gout is suspected. This elevates uric acid levels.  Only take over-the-counter or prescription medicines for pain, discomfort or fever as  directed by your caregiver.  Rest the joint as much as possible.  If your joint is swollen, keep it elevated.  Use crutches if the painful joint is in your leg.  Drinking plenty of fluids may help for certain types of arthritis.  Follow your caregiver's dietary instructions.  Try low-impact exercise such as:  Swimming.  Water aerobics.  Biking.  Walking.  Morning stiffness is often relieved by a warm shower.  Put your joints through regular range-of-motion. SEEK MEDICAL CARE IF:   You do not feel better in 24 hours or are getting worse.  You have side effects to medications, or are not getting better with treatment. SEEK IMMEDIATE MEDICAL CARE IF:   You have a fever.  You develop severe joint pain, swelling or redness.  Many joints are involved and become painful and swollen.  There is severe back pain and/or leg weakness.  You have loss of bowel or bladder control. Document Released: 01/17/2005 Document Revised: 03/03/2012 Document Reviewed: 02/02/2009 [facility] Patient Information 2014 [place].

## 2014-03-15 NOTE — Progress Notes (Signed)
Pre visit review using our clinic review tool, if applicable. No additional management support is needed unless otherwise documented below in the visit note. 

## 2014-03-16 ENCOUNTER — Telehealth: Payer: Self-pay | Admitting: Family Medicine

## 2014-03-16 LAB — HEPATIC FUNCTION PANEL
ALK PHOS: 80 U/L (ref 39–117)
ALT: 13 U/L (ref 0–35)
AST: 14 U/L (ref 0–37)
Albumin: 3.9 g/dL (ref 3.5–5.2)
BILIRUBIN INDIRECT: 0.2 mg/dL (ref 0.2–1.2)
Bilirubin, Direct: 0.1 mg/dL (ref 0.0–0.3)
TOTAL PROTEIN: 6.8 g/dL (ref 6.0–8.3)
Total Bilirubin: 0.3 mg/dL (ref 0.2–1.2)

## 2014-03-16 LAB — RENAL FUNCTION PANEL
Albumin: 3.9 g/dL (ref 3.5–5.2)
BUN: 27 mg/dL — AB (ref 6–23)
CHLORIDE: 103 meq/L (ref 96–112)
CO2: 27 mEq/L (ref 19–32)
Calcium: 8.9 mg/dL (ref 8.4–10.5)
Creat: 1.07 mg/dL (ref 0.50–1.10)
GLUCOSE: 80 mg/dL (ref 70–99)
PHOSPHORUS: 5.3 mg/dL — AB (ref 2.3–4.6)
POTASSIUM: 4.1 meq/L (ref 3.5–5.3)
Sodium: 143 mEq/L (ref 135–145)

## 2014-03-16 LAB — CBC
HCT: 37.9 % (ref 36.0–46.0)
HEMOGLOBIN: 12.9 g/dL (ref 12.0–15.0)
MCH: 28 pg (ref 26.0–34.0)
MCHC: 34 g/dL (ref 30.0–36.0)
MCV: 82.2 fL (ref 78.0–100.0)
Platelets: 264 10*3/uL (ref 150–400)
RBC: 4.61 MIL/uL (ref 3.87–5.11)
RDW: 15.1 % (ref 11.5–15.5)
WBC: 7.6 10*3/uL (ref 4.0–10.5)

## 2014-03-16 LAB — LIPID PANEL
CHOLESTEROL: 169 mg/dL (ref 0–200)
HDL: 47 mg/dL (ref >39)
LDL CALC: 72 mg/dL (ref 0–99)
Total CHOL/HDL Ratio: 3.6 Ratio
Triglycerides: 250 mg/dL — ABNORMAL HIGH (ref <150)
VLDL: 50 mg/dL — AB (ref 0–40)

## 2014-03-16 LAB — VITAMIN D 25 HYDROXY (VIT D DEFICIENCY, FRACTURES): Vit D, 25-Hydroxy: 88 ng/mL (ref 30–89)

## 2014-03-16 LAB — RHEUMATOID FACTOR: Rhuematoid fact SerPl-aCnc: <10 IU/mL (ref <=14)

## 2014-03-16 LAB — URIC ACID: Uric Acid, Serum: 5.9 mg/dL (ref 2.4–7.0)

## 2014-03-16 NOTE — Telephone Encounter (Signed)
Relevant patient education mailed to patient.  

## 2014-03-20 ENCOUNTER — Encounter: Payer: Self-pay | Admitting: Family Medicine

## 2014-03-20 DIAGNOSIS — M79644 Pain in right finger(s): Secondary | ICD-10-CM | POA: Insufficient documentation

## 2014-03-20 DIAGNOSIS — M79641 Pain in right hand: Secondary | ICD-10-CM | POA: Insufficient documentation

## 2014-03-20 DIAGNOSIS — M79642 Pain in left hand: Secondary | ICD-10-CM

## 2014-03-20 NOTE — Progress Notes (Signed)
Patient ID: [name], female   DOB: 1946/10/16, 68 y.o.   MRN: [id number] [name] [id number] 01-21-1946 03/20/2014      Progress Note-Follow Up  Subjective  Chief Complaint  Chief Complaint  Patient presents with  . Follow-up    3 month  . Injections    tdap    HPI  Patient is a 68 year old female in today for routine medical care. She generally feels well. She does continue to struggle with some back pain. Today he notes increasing hand pain. Bilateral, mostly noted in her knuckles. No redness, warmth or swelling. No injury. No other recent illness or concerns. Agrees to tetanus shot today. Denies CP/palp/SOB/HA/congestion/fevers/GI or GU c/o. Taking meds as prescribed  Past Medical History  Diagnosis Date  . GERD (gastroesophageal reflux disease)     occasional  . Hyperlipidemia   . Hypertension     Echocardiogram 11/19/11: Difficult acoustic windows, EF 60-65%, normal LV wall thickness, grade 1 diastolic dysfunction  . Vertigo   . Tubular adenoma of colon 06/2011  . Colitis   . Multinodular thyroid     Goiter s/p thyroidectomy in 2007 with post-op  hypocalcemia and post-op hypothyroidism/hypoparathyroidism with hypocalcemia  . CAD (coronary artery disease)     a) NSTEMI 10/2011: LHC 11/19/11: pLAD 30%, oOM 60%, AVCFX 30%, CFX after OM2 30%, pRCA 60 and 70%, then 99%, AM 80-90% with TIMI 3 flow.  PCI: Promus DES x 2 to RCA.  Marland Kitchen Post-surgical hypothyroidism   . Hypoparathyroidism   . Palpitations     a) 03/2012 - patient set up for event monitor but did not wear correctly -  she declined wearing a repeat monitor  . Unspecified constipation 05/31/2013  . Back pain 05/31/2013  . Nocturia 05/31/2013  . Neck pain on right side 07/13/2013    Past Surgical History  Procedure Laterality Date  . Colonoscopy  Aenomatous polyps    07/05/2011  . Total thyroidectomy  2007    GOITER  . Shoulder arthroscopy w/ rotator cuff repair      right    Family History  Problem  Relation Age of Onset  . Colon cancer Brother   . Cancer Brother     COLON  . Hypertension Father   . Diabetes Neg Hx   . Prostate cancer Neg Hx   . Heart disease Father   . Breast cancer Neg Hx     History   Social History  . Marital Status: Single    Spouse Name: N/A    Number of Children: N/A  . Years of Education: N/A   Occupational History  . Not on file.   Social History Main Topics  . Smoking status: Never Smoker   . Smokeless tobacco: Never Used  . Alcohol Use: No  . Drug Use: No  . Sexual Activity: Yes   Other Topics Concern  . Not on file   Social History Narrative  . No narrative on file    Current Outpatient Prescriptions on File Prior to Visit  Medication Sig Dispense Refill  . amLODipine (NORVASC) 10 MG tablet Take 1 tablet (10 mg total) by mouth daily.  90 tablet  3  . aspirin EC 81 MG tablet Take 81 mg by mouth daily.        . calcitRIOL (ROCALTROL) 0.25 MCG capsule Take 3 capsules (0.75 mcg total) by mouth daily.  360 capsule  3  . calcium carbonate (TUMS - DOSED IN MG ELEMENTAL CALCIUM) 500  MG chewable tablet Chew 4 tablets by mouth 2 (two) times daily.       . clopidogrel (PLAVIX) 75 MG tablet Take 1 tablet (75 mg total) by mouth daily with breakfast.  90 tablet  3  . CYANOCOBALAMIN PO Take 1 tablet by mouth daily.      . cyclobenzaprine (FLEXERIL) 5 MG tablet Take 5 mg by mouth at bedtime as needed. For muscle spasms      . famotidine (PEPCID) 20 MG tablet Take 1 tablet (20 mg total) by mouth 2 (two) times daily.  180 tablet  0  . isosorbide mononitrate (IMDUR) 60 MG 24 hr tablet Take 1 tablet (60 mg total) by mouth daily.  90 tablet  3  . levothyroxine (SYNTHROID, LEVOTHROID) 112 MCG tablet Take 1 tablet (112 mcg total) by mouth daily.  90 tablet  3  . methocarbamol (ROBAXIN) 500 MG tablet Take 1 tablet (500 mg total) by mouth 4 (four) times daily as needed.  90 tablet  1  . nitroGLYCERIN (NITROSTAT) 0.4 MG SL tablet Place 1 tablet (0.4 mg total)  under the tongue every 5 (five) minutes as needed for chest pain.  25 tablet  3  . rosuvastatin (CRESTOR) 20 MG tablet Take 1 tablet (20 mg total) by mouth daily.  30 tablet  5  . traMADol (ULTRAM) 50 MG tablet Take 1 tablet (50 mg total) by mouth at bedtime as needed for pain.  30 tablet  1  . vitamin D, CHOLECALCIFEROL, 400 UNITS tablet Take 1 tablet (400 Units total) by mouth 2 (two) times daily.  60 tablet  1   No current facility-administered medications on file prior to visit.    No Known Allergies  Review of Systems  Review of Systems  Constitutional: Negative for fever and malaise/fatigue.  HENT: Negative for congestion.   Eyes: Negative for discharge.  Respiratory: Negative for shortness of breath.   Cardiovascular: Negative for chest pain, palpitations and leg swelling.  Gastrointestinal: Negative for nausea, abdominal pain and diarrhea.  Genitourinary: Negative for dysuria.  Musculoskeletal: Positive for back pain and joint pain. Negative for falls.  Skin: Negative for rash.  Neurological: Negative for loss of consciousness and headaches.  Endo/Heme/Allergies: Negative for polydipsia.  Psychiatric/Behavioral: Negative for depression and suicidal ideas. The patient is not nervous/anxious and does not have insomnia.     Objective  BP 128/72  Pulse 62  Temp(Src) 97.9 F (36.6 C) (Oral)  Ht 5\' 4"  (1.626 m)  Wt 193 lb 1.3 oz (87.581 kg)  BMI 33.13 kg/m2  SpO2 96%  Physical Exam  Physical Exam  Constitutional: She is oriented to person, place, and time and well-developed, well-nourished, and in no distress. No distress.  HENT:  Head: Normocephalic and atraumatic.  Eyes: Conjunctivae are normal.  Neck: Neck supple. No thyromegaly present.  Cardiovascular: Normal rate, regular rhythm and normal heart sounds.   No murmur heard. Pulmonary/Chest: Effort normal and breath sounds normal. She has no wheezes.  Abdominal: She exhibits no distension and no mass.   Musculoskeletal: She exhibits no edema.  Lymphadenopathy:    She has no cervical adenopathy.  Neurological: She is alert and oriented to person, place, and time.  Skin: Skin is warm and dry. No rash noted. She is not diaphoretic.  Psychiatric: Memory, affect and judgment normal.    Lab Results  Component Value Date   TSH 0.14* 01/22/2014   Lab Results  Component Value Date   WBC 7.6 03/15/2014   HGB 12.9  03/15/2014   HCT 37.9 03/15/2014   MCV 82.2 03/15/2014   PLT 264 03/15/2014   Lab Results  Component Value Date   CREATININE 1.07 03/15/2014   BUN 27* 03/15/2014   NA 143 03/15/2014   K 4.1 03/15/2014   CL 103 03/15/2014   CO2 27 03/15/2014   Lab Results  Component Value Date   ALT 13 03/15/2014   AST 14 03/15/2014   ALKPHOS 80 03/15/2014   BILITOT 0.3 03/15/2014   Lab Results  Component Value Date   CHOL 169 03/15/2014   Lab Results  Component Value Date   HDL 47 03/15/2014   Lab Results  Component Value Date   LDLCALC 72 03/15/2014   Lab Results  Component Value Date   TRIG 250* 03/15/2014   Lab Results  Component Value Date   CHOLHDL 3.6 03/15/2014     Assessment & Plan  Unspecified hypothyroidism tsh suppressed at last check but assymptomatic will reevaluate with next blood draw  GERD (gastroesophageal reflux disease) Avoid offending foods, start probiotics. Do not eat large meals in late evening and consider raising head of bed.   Essential hypertension, benign Well controlled, no changes to meds. Encouraged heart healthy diet such as the DASH diet and exercise as tolerated.   HYPERLIPIDEMIA-MIXED Tolerating statin, encouraged heart healthy diet, avoid trans fats, minimize simple carbs and saturated fats. Increase exercise as tolerated  Bilateral hand pain RF and labs unremarkable. Encouraged to exercise hands with ball. Try ASpercreme prn.  Back pain Encouraged moist heat and gentle stretching as tolerated. May try NSAIDs and prescription meds as  directed and report if symptoms worsen or seek immediate care  CAD, NATIVE VESSEL Asymptomatic at this time

## 2014-03-20 NOTE — Assessment & Plan Note (Signed)
RF and labs unremarkable. Encouraged to exercise hands with ball. Try ASpercreme prn.

## 2014-03-20 NOTE — Assessment & Plan Note (Signed)
tsh suppressed at last check but assymptomatic will reevaluate with next blood draw

## 2014-03-20 NOTE — Assessment & Plan Note (Signed)
Avoid offending foods, start probiotics. Do not eat large meals in late evening and consider raising head of bed.  

## 2014-03-20 NOTE — Assessment & Plan Note (Signed)
Well controlled, no changes to meds. Encouraged heart healthy diet such as the DASH diet and exercise as tolerated.  °

## 2014-03-20 NOTE — Assessment & Plan Note (Signed)
Asymptomatic at this time 

## 2014-03-20 NOTE — Assessment & Plan Note (Signed)
Tolerating statin, encouraged heart healthy diet, avoid trans fats, minimize simple carbs and saturated fats. Increase exercise as tolerated 

## 2014-03-20 NOTE — Assessment & Plan Note (Signed)
Encouraged moist heat and gentle stretching as tolerated. May try NSAIDs and prescription meds as directed and report if symptoms worsen or seek immediate care 

## 2014-03-24 ENCOUNTER — Other Ambulatory Visit: Payer: Self-pay | Admitting: Internal Medicine

## 2014-03-29 ENCOUNTER — Ambulatory Visit (INDEPENDENT_AMBULATORY_CARE_PROVIDER_SITE_OTHER): Payer: Medicare Other | Admitting: Family Medicine

## 2014-03-29 ENCOUNTER — Encounter: Payer: Self-pay | Admitting: Family Medicine

## 2014-03-29 VITALS — BP 118/50 | HR 68 | Temp 98.8°F | Wt 190.8 lb

## 2014-03-29 DIAGNOSIS — N390 Urinary tract infection, site not specified: Secondary | ICD-10-CM

## 2014-03-29 DIAGNOSIS — R300 Dysuria: Secondary | ICD-10-CM

## 2014-03-29 LAB — POCT URINALYSIS DIPSTICK
Bilirubin, UA: NEGATIVE
Glucose, UA: NEGATIVE
KETONES UA: NEGATIVE
Nitrite, UA: POSITIVE
PH UA: 6
Spec Grav, UA: 1.015
Urobilinogen, UA: 0.2

## 2014-03-29 MED ORDER — PHENAZOPYRIDINE HCL 200 MG PO TABS: 200.0000 mg | ORAL_TABLET | Freq: Three times a day (TID) | ORAL | Status: DC | PRN

## 2014-03-29 MED ORDER — CIPROFLOXACIN HCL 500 MG PO TABS: 500.0000 mg | ORAL_TABLET | Freq: Two times a day (BID) | ORAL | Status: DC

## 2014-03-29 NOTE — Progress Notes (Signed)
Pre visit review using our clinic review tool, if applicable. No additional management support is needed unless otherwise documented below in the visit note. 

## 2014-03-29 NOTE — Patient Instructions (Signed)
Urinary Tract Infection  Urinary tract infections (UTIs) can develop anywhere along your urinary tract. Your urinary tract is your body's drainage system for removing wastes and extra water. Your urinary tract includes two kidneys, two ureters, a bladder, and a urethra. Your kidneys are a pair of bean-shaped organs. Each kidney is about the size of your fist. They are located below your ribs, one on each side of your spine.  CAUSES  Infections are caused by microbes, which are microscopic organisms, including fungi, viruses, and bacteria. These organisms are so small that they can only be seen through a microscope. Bacteria are the microbes that most commonly cause UTIs.  SYMPTOMS   Symptoms of UTIs may vary by age and gender of the patient and by the location of the infection. Symptoms in young women typically include a frequent and intense urge to urinate and a painful, burning feeling in the bladder or urethra during urination. Older women and men are more likely to be tired, shaky, and weak and have muscle aches and abdominal pain. A fever may mean the infection is in your kidneys. Other symptoms of a kidney infection include pain in your back or sides below the ribs, nausea, and vomiting.  DIAGNOSIS  To diagnose a UTI, your caregiver will ask you about your symptoms. Your caregiver also will ask to provide a urine sample. The urine sample will be tested for bacteria and white blood cells. White blood cells are made by your body to help fight infection.  TREATMENT   Typically, UTIs can be treated with medication. Because most UTIs are caused by a bacterial infection, they usually can be treated with the use of antibiotics. The choice of antibiotic and length of treatment depend on your symptoms and the type of bacteria causing your infection.  HOME CARE INSTRUCTIONS   If you were prescribed antibiotics, take them exactly as your caregiver instructs you. Finish the medication even if you feel better after you  have only taken some of the medication.   Drink enough water and fluids to keep your urine clear or pale yellow.   Avoid caffeine, tea, and carbonated beverages. They tend to irritate your bladder.   Empty your bladder often. Avoid holding urine for long periods of time.   Empty your bladder before and after sexual intercourse.   After a bowel movement, women should cleanse from front to back. Use each tissue only once.  SEEK MEDICAL CARE IF:    You have back pain.   You develop a fever.   Your symptoms do not begin to resolve within 3 days.  SEEK IMMEDIATE MEDICAL CARE IF:    You have severe back pain or lower abdominal pain.   You develop chills.   You have nausea or vomiting.   You have continued burning or discomfort with urination.  MAKE SURE YOU:    Understand these instructions.   Will watch your condition.   Will get help right away if you are not doing well or get worse.  Document Released: 09/19/2005 Document Revised: 06/10/2012 Document Reviewed: 01/18/2012  [employer] Patient Information 2014 [employer]

## 2014-03-29 NOTE — Progress Notes (Signed)
  Subjective:    [name] is a 68 y.o. female who complains of dysuria, frequency and suprapubic pressure. She has had symptoms for 3 days. Patient also complains of no other symptoms. Patient denies back pain, congestion, cough, fever, headache, rhinitis, sorethroat, stomach ache and vaginal discharge. Patient does not have a history of recurrent UTI. Patient does not have a history of pyelonephritis.   The following portions of the patient's history were reviewed and updated as appropriate: allergies, current medications, past family history, past medical history, past social history, past surgical history and problem list.  Review of Systems Pertinent items are noted in HPI.    Objective:    BP 118/50  Pulse 68  Temp(Src) 98.8 F (37.1 C) (Oral)  Wt 190 lb 12.8 oz (86.546 kg)  SpO2 98% General appearance: alert, cooperative, appears stated age and no distress Neurologic: Alert and oriented X 3, normal strength and tone. Normal symmetric reflexes. Normal coordination and gait  Laboratory:  Urine dipstick: large for hemoglobin, trace for ketones, 3+ for leukocyte esterase and pos for nitrites.   Micro exam: not done.    Assessment:    UTI     Plan:    Medications: ciprofloxacin. Maintain adequate hydration. Follow up if symptoms not improving, and as needed.

## 2014-03-31 LAB — URINE CULTURE: Colony Count: >=100000

## 2014-04-01 ENCOUNTER — Telehealth: Payer: Self-pay | Admitting: Family Medicine

## 2014-04-01 NOTE — Telephone Encounter (Signed)
4.9.15  Pt is wanting to know the results of urine test from 03/29/14.  Pt was seen in office on 03/29/14 by Dr. Etter Sjogren, but Dr. Randel Pigg is PCP.

## 2014-04-01 NOTE — Telephone Encounter (Signed)
Notes Recorded by Rosalita Chessman, DO on 04/01/2014 at 8:28 AM + UTI --- on cipro   Discussed with patient and she voiced understanding.  KP

## 2014-04-27 ENCOUNTER — Encounter: Payer: Self-pay | Admitting: Gastroenterology

## 2014-04-29 ENCOUNTER — Other Ambulatory Visit: Payer: Self-pay | Admitting: Internal Medicine

## 2014-05-05 ENCOUNTER — Other Ambulatory Visit (INDEPENDENT_AMBULATORY_CARE_PROVIDER_SITE_OTHER): Payer: Medicare Other

## 2014-05-05 ENCOUNTER — Other Ambulatory Visit: Payer: Medicare Other

## 2014-05-05 DIAGNOSIS — E039 Hypothyroidism, unspecified: Secondary | ICD-10-CM

## 2014-05-05 LAB — T4, FREE: Free T4: 1.16 ng/dL (ref 0.60–1.60)

## 2014-05-05 LAB — TSH: TSH: 1.06 u[IU]/mL (ref 0.35–4.50)

## 2014-05-07 ENCOUNTER — Ambulatory Visit (INDEPENDENT_AMBULATORY_CARE_PROVIDER_SITE_OTHER): Payer: Medicare Other | Admitting: Endocrinology

## 2014-05-07 ENCOUNTER — Encounter: Payer: Self-pay | Admitting: Endocrinology

## 2014-05-07 VITALS — BP 118/60 | HR 72 | Temp 97.9°F | Resp 14 | Ht 64.0 in | Wt 193.0 lb

## 2014-05-07 DIAGNOSIS — E892 Postprocedural hypoparathyroidism: Secondary | ICD-10-CM

## 2014-05-07 DIAGNOSIS — E209 Hypoparathyroidism, unspecified: Secondary | ICD-10-CM

## 2014-05-07 DIAGNOSIS — E890 Postprocedural hypothyroidism: Secondary | ICD-10-CM

## 2014-05-07 NOTE — Progress Notes (Signed)
Patient ID: [name], female   DOB: 11/28/1946, 68 y.o.   MRN: [id number]   CHIEF complaint: Followup of hypothyroidism  History of Present Illness:   HYPOTHYROIDISM:  She has had postsurgical hypothyroidism.  On her last visit her TSH was 0.14 but was having no symptoms of  palpitations or shakiness She was changed to 112 mcg from the previous dose of 125 mcg She thinks she has gained weight but it is only 2 pounds No  recent fatigue.  She has been  is compliant with her medication and is taking her calcium separately in the evening  Lab Results  Component Value Date   FREET4 1.16 05/05/2014   FREET4 1.07 09/08/2013   TSH 1.06 05/05/2014   TSH 0.14* 01/22/2014   TSH 1.00 09/08/2013     HYPOPARATHYROIDISM:  Past history: Patient had subtotal thyroidectomy in 2007 because of multiple nodules which were benign Postoperatively the patient had hoarseness along with hypocalcemia. She had symptoms of numbness all over with the hypocalcemia which improved with  calcitriol and calcium supplements She also has had periodic leg cramps when she has a low calcium She was taking a total of 2 mcg calcitriol and subsequently dose was reduced to 0.5 mcg   She had more hypocalcemia since 04/2012 until recently. Her calcitriol was increased to 0.75 mcg a day when she was having symptoms of numbness in her hands and face.  She also has been compliant with taking 2000 mg of calcium daily in the form of Caltrate at dinnertime Since her calcitriol was increased to 0.75 mcg has no symptoms of numbness in her hands and face and no leg cramps The patient's calcium levels are as below.   Lab Results  Component Value Date   CALCIUM 8.9 03/15/2014   CALCIUM 9.0 01/22/2014   CALCIUM 8.4 09/08/2013   CALCIUM 8.3* 08/09/2013   CALCIUM 8.1* 07/16/2013   CALCIUM 8.1* 07/16/2013   CALCIUM 8.8 07/13/2013   CALCIUM 7.6* 05/28/2013   CALCIUM 8.1* 11/22/2012   CALCIUM 8.1* 08/11/2012   CALCIUM 8.7 07/17/2012        Past Medical History  Diagnosis Date  . GERD (gastroesophageal reflux disease)     occasional  . Hyperlipidemia   . Hypertension     Echocardiogram 11/19/11: Difficult acoustic windows, EF 60-65%, normal LV wall thickness, grade 1 diastolic dysfunction  . Vertigo   . Tubular adenoma of colon 06/2011  . Colitis   . Multinodular thyroid     Goiter s/p thyroidectomy in 2007 with post-op  hypocalcemia and post-op hypothyroidism/hypoparathyroidism with hypocalcemia  . CAD (coronary artery disease)     a) NSTEMI 10/2011: LHC 11/19/11: pLAD 30%, oOM 60%, AVCFX 30%, CFX after OM2 30%, pRCA 60 and 70%, then 99%, AM 80-90% with TIMI 3 flow.  PCI: Promus DES x 2 to RCA.  Marland Kitchen Post-surgical hypothyroidism   . Hypoparathyroidism   . Palpitations     a) 03/2012 - patient set up for event monitor but did not wear correctly -  she declined wearing a repeat monitor  . Unspecified constipation 05/31/2013  . Back pain 05/31/2013  . Nocturia 05/31/2013  . Neck pain on right side 07/13/2013    Past Surgical History  Procedure Laterality Date  . Colonoscopy  Aenomatous polyps    07/05/2011  . Total thyroidectomy  2007    GOITER  . Shoulder arthroscopy w/ rotator cuff repair      right    Family History  Problem Relation  Age of Onset  . Colon cancer Brother   . Cancer Brother     COLON  . Hypertension Father   . Diabetes Neg Hx   . Prostate cancer Neg Hx   . Heart disease Father   . Breast cancer Neg Hx     Social History:  reports that she has never smoked. She has never used smokeless tobacco. She reports that she does not drink alcohol or use illicit drugs.  Allergies: No Known Allergies    Medication List       This list is accurate as of: 05/07/14  9:17 AM.  Always use your most recent med list.               amLODipine 10 MG tablet  Commonly known as:  NORVASC  Take 1 tablet by mouth  daily     aspirin EC 81 MG tablet  Take 81 mg by mouth daily.     calcitRIOL 0.25  MCG capsule  Commonly known as:  ROCALTROL  Take 3 capsules (0.75 mcg total) by mouth daily.     calcium carbonate 500 MG chewable tablet  Commonly known as:  TUMS - dosed in mg elemental calcium  Chew 4 tablets by mouth 2 (two) times daily.     ciprofloxacin 500 MG tablet  Commonly known as:  CIPRO  Take 1 tablet (500 mg total) by mouth 2 (two) times daily.     clopidogrel 75 MG tablet  Commonly known as:  PLAVIX  Take 1 tablet (75 mg total) by mouth daily with  breakfast.     CYANOCOBALAMIN PO  Take 1 tablet by mouth daily.     cyclobenzaprine 5 MG tablet  Commonly known as:  FLEXERIL  Take 5 mg by mouth at bedtime as needed. For muscle spasms     famotidine 20 MG tablet  Commonly known as:  PEPCID  Take 1 tablet by mouth two  times daily     isosorbide mononitrate 60 MG 24 hr tablet  Commonly known as:  IMDUR  Take 1 tablet by mouth  daily.     levothyroxine 112 MCG tablet  Commonly known as:  SYNTHROID, LEVOTHROID  Take 1 tablet (112 mcg total) by mouth daily.     methocarbamol 500 MG tablet  Commonly known as:  ROBAXIN  Take 1 tablet (500 mg total) by mouth 4 (four) times daily as needed.     NITROSTAT 0.4 MG SL tablet  Generic drug:  nitroGLYCERIN  Dissolve 1 tablet under the tongue every 5 minutes as   needed for chest pain as   directed     phenazopyridine 200 MG tablet  Commonly known as:  PYRIDIUM  Take 1 tablet (200 mg total) by mouth 3 (three) times daily as needed for pain.     rosuvastatin 20 MG tablet  Commonly known as:  CRESTOR  Take 1 tablet (20 mg total) by mouth daily.     traMADol 50 MG tablet  Commonly known as:  ULTRAM  Take 1 tablet (50 mg total) by mouth at bedtime as needed for pain.     vitamin D (CHOLECALCIFEROL) 400 UNITS tablet  Take 1 tablet (400 Units total) by mouth 2 (two) times daily.          REVIEW OF SYSTEMS:         Had vertigo previously    Hair loss: She has complained of this for a few years, no etiology  found  Has  a history of hypertension and CAD  Back, neck pain: on Tylenol and not any Advil/Aleve  History of hypercholesterolemia     PHYSICAL EXAM:  BP 118/60  Pulse 72  Temp(Src) 97.9 F (36.6 C)  Resp 14  Ht 5\' 4"  (1.626 m)  Wt 193 lb (87.544 kg)  BMI 33.11 kg/m2  SpO2 97%  Neck exam is normal without any mass NEUROLOGICAL: .Reflexes are normal at biceps. No peripheral edema   ASSESSMENT/PLAN:    HYPOTHYROIDISM: This is secondary to thyroidectomy. Currently her TSH is back to normal with 112 mcg She is compliant with her medication and will continue the same dose Reassured the patient that this will not cause her to gain weight  Follow up in 4 months and will have renal function panel next time  Elayne Snare 05/07/2014, 9:17 AM

## 2014-05-10 ENCOUNTER — Encounter: Payer: Self-pay | Admitting: Gastroenterology

## 2014-05-20 ENCOUNTER — Ambulatory Visit: Payer: Self-pay

## 2014-05-20 ENCOUNTER — Other Ambulatory Visit: Payer: Self-pay | Admitting: Occupational Medicine

## 2014-05-20 DIAGNOSIS — M549 Dorsalgia, unspecified: Secondary | ICD-10-CM

## 2014-05-24 ENCOUNTER — Encounter (HOSPITAL_BASED_OUTPATIENT_CLINIC_OR_DEPARTMENT_OTHER): Payer: Self-pay | Admitting: Emergency Medicine

## 2014-05-24 ENCOUNTER — Emergency Department (HOSPITAL_BASED_OUTPATIENT_CLINIC_OR_DEPARTMENT_OTHER)
Admission: EM | Admit: 2014-05-24 | Discharge: 2014-05-24 | Disposition: A | Discharge status: Home / Self Care | Payer: Medicare Other | Attending: Emergency Medicine | Admitting: Emergency Medicine

## 2014-05-24 DIAGNOSIS — Z7982 Long term (current) use of aspirin: Secondary | ICD-10-CM | POA: Insufficient documentation

## 2014-05-24 DIAGNOSIS — I493 Ventricular premature depolarization: Secondary | ICD-10-CM

## 2014-05-24 DIAGNOSIS — K219 Gastro-esophageal reflux disease without esophagitis: Secondary | ICD-10-CM | POA: Insufficient documentation

## 2014-05-24 DIAGNOSIS — Z860100 Personal history of colon polyps, unspecified: Secondary | ICD-10-CM | POA: Insufficient documentation

## 2014-05-24 DIAGNOSIS — E785 Hyperlipidemia, unspecified: Secondary | ICD-10-CM | POA: Insufficient documentation

## 2014-05-24 DIAGNOSIS — I2510 Atherosclerotic heart disease of native coronary artery without angina pectoris: Secondary | ICD-10-CM | POA: Insufficient documentation

## 2014-05-24 DIAGNOSIS — E890 Postprocedural hypothyroidism: Secondary | ICD-10-CM | POA: Insufficient documentation

## 2014-05-24 DIAGNOSIS — I4949 Other premature depolarization: Secondary | ICD-10-CM | POA: Insufficient documentation

## 2014-05-24 DIAGNOSIS — Z7902 Long term (current) use of antithrombotics/antiplatelets: Secondary | ICD-10-CM | POA: Insufficient documentation

## 2014-05-24 DIAGNOSIS — I10 Essential (primary) hypertension: Secondary | ICD-10-CM | POA: Insufficient documentation

## 2014-05-24 DIAGNOSIS — Z79899 Other long term (current) drug therapy: Secondary | ICD-10-CM | POA: Insufficient documentation

## 2014-05-24 LAB — CBC WITH DIFFERENTIAL/PLATELET
Basophils Absolute: 0 10*3/uL (ref 0.0–0.1)
Basophils Relative: 0 % (ref 0–1)
Eosinophils Absolute: 0.4 10*3/uL (ref 0.0–0.7)
Eosinophils Relative: 4 % (ref 0–5)
HEMATOCRIT: 41.3 % (ref 36.0–46.0)
HEMOGLOBIN: 13.9 g/dL (ref 12.0–15.0)
Lymphocytes Relative: 25 % (ref 12–46)
Lymphs Abs: 2 10*3/uL (ref 0.7–4.0)
MCH: 29.1 pg (ref 26.0–34.0)
MCHC: 33.7 g/dL (ref 30.0–36.0)
MCV: 86.6 fL (ref 78.0–100.0)
MONO ABS: 0.7 10*3/uL (ref 0.1–1.0)
MONOS PCT: 9 % (ref 3–12)
NEUTROS ABS: 5.1 10*3/uL (ref 1.7–7.7)
NEUTROS PCT: 62 % (ref 43–77)
Platelets: 213 10*3/uL (ref 150–400)
RBC: 4.77 MIL/uL (ref 3.87–5.11)
RDW: 14.5 % (ref 11.5–15.5)
WBC: 8.3 10*3/uL (ref 4.0–10.5)

## 2014-05-24 LAB — URINALYSIS, ROUTINE W REFLEX MICROSCOPIC
Bilirubin Urine: NEGATIVE
Glucose, UA: NEGATIVE mg/dL
HGB URINE DIPSTICK: NEGATIVE
Ketones, ur: NEGATIVE mg/dL
Nitrite: NEGATIVE
Protein, ur: NEGATIVE mg/dL
SPECIFIC GRAVITY, URINE: 1.014 (ref 1.005–1.030)
UROBILINOGEN UA: 0.2 mg/dL (ref 0.0–1.0)
pH: 7.5 (ref 5.0–8.0)

## 2014-05-24 LAB — URINE MICROSCOPIC-ADD ON

## 2014-05-24 LAB — COMPREHENSIVE METABOLIC PANEL
ALT: 16 U/L (ref 0–35)
AST: 17 U/L (ref 0–37)
Albumin: 3.7 g/dL (ref 3.5–5.2)
Alkaline Phosphatase: 88 U/L (ref 39–117)
BUN: 27 mg/dL — AB (ref 6–23)
CALCIUM: 9 mg/dL (ref 8.4–10.5)
CO2: 28 mEq/L (ref 19–32)
Chloride: 102 mEq/L (ref 96–112)
Creatinine, Ser: 1.4 mg/dL — ABNORMAL HIGH (ref 0.50–1.10)
GFR calc non Af Amer: 38 mL/min — ABNORMAL LOW (ref >90)
GFR, EST AFRICAN AMERICAN: 44 mL/min — AB (ref >90)
GLUCOSE: 100 mg/dL — AB (ref 70–99)
Potassium: 3.9 mEq/L (ref 3.7–5.3)
Sodium: 145 mEq/L (ref 137–147)
TOTAL PROTEIN: 7.4 g/dL (ref 6.0–8.3)
Total Bilirubin: <0.2 mg/dL — ABNORMAL LOW (ref 0.3–1.2)

## 2014-05-24 LAB — TROPONIN I: Troponin I: <0.3 ng/mL (ref <0.30)

## 2014-05-24 NOTE — ED Notes (Signed)
Pt reports substernal CP radiating into her (L) shoulder and chest. Reports SOB, denies N/V.  Denies diaphoresis.  States that she sitting when the episode started.

## 2014-05-24 NOTE — Discharge Instructions (Signed)
Recheck with your physician if your symptoms become more frequent.  Premature Ventricular Contraction Premature ventricular contraction (PVC) is an irregularity of the heart rhythm involving extra or skipped heartbeats. In some cases, they may occur without obvious cause or heart disease. Other times, they can be caused by an electrolyte change in the blood. These need to be corrected. They can also be seen when there is not enough oxygen going to the heart. A common cause of this is plaque or cholesterol buildup. This buildup decreases the blood supply to the heart. In addition, extra beats may be caused or aggravated by:  Excessive smoking.  Alcohol consumption.  Caffeine.  Certain medications  Some street drugs. SYMPTOMS   The sensation of feeling your heart skipping a beat (palpitations).  In many cases, the person may have no symptoms. SIGNS AND TESTS   A physical examination may show an occasional irregularity, but if the PVC beats do not happen often, they may not be found on physical exam.  Blood pressure is usually normal.  Other tests that may find extra beats of the heart are:  An EKG (electrocardiogram)  A Holter monitor which can monitor your heart over longer periods of time  An Angiogram (study of the heart arteries). TREATMENT  Usually extra heartbeats do not need treatment. The condition is treated only if symptoms are severe or if extra beats are very frequent or are causing problems. An underlying cause, if discovered, may also require treatment.  Treatment may also be needed if there may be a risk for other more serious cardiac arrhythmias.  PREVENTION   Moderation in caffeine, alcohol, and tobacco use may reduce the risk of ectopic heartbeats in some people.  Exercise often helps people who lead a sedentary (inactive) lifestyle. PROGNOSIS  PVC heartbeats are generally harmless and do not need treatment.  RISKS AND COMPLICATIONS   Ventricular  tachycardia (occasionally).  There usually are no complications.  Other arrhythmias (occasionally). SEEK IMMEDIATE MEDICAL CARE IF:   You feel palpitations that are frequent or continual.  You develop chest pain or other problems such as shortness of breath, sweating, or nausea and vomiting.  You become light-headed or faint (pass out).  You get worse or do not improve with treatment. Document Released: 07/27/2004 Document Revised: 03/03/2012 Document Reviewed: 02/06/2008 [facility] Patient Information 2014 [employer].

## 2014-05-24 NOTE — ED Provider Notes (Signed)
CSN: [id number]     Arrival date & time 05/24/14  1842 History  This chart was scribed for Tanna Furry, MD by Ladene Artist, ED Scribe. The patient was seen in room MH01/MH01. Patient's care was started at 8:09 PM.   Chief Complaint  Patient presents with  . Chest Pain    The history is provided by the patient. No language interpreter was used.   HPI Comments: LAQUESHIA CIHLAR is a 68 y.o. female, with a h/o CAD and palpitations, who presents to the Emergency Department complaining of intermittent, sudden onset of substernal chest pain that radiates into L shoulder. Pt states that she was sitting down when she initially noted the pain today around 4 PM. Pt reports 2 episodes that lasted for a few seconds. She denies SOB, swelling in extremities, abdominal pain. Pt also denies consuming caffeine before noticing the pain. Pt states that pain is not similar to the pain that she feels with heart attack. Pt denies new medications but states that she has applied patches to her back for pain. Pt also reports that her thyroid medication was changed approximately 3 months ago.  Past Medical History  Diagnosis Date  . GERD (gastroesophageal reflux disease)     occasional  . Hyperlipidemia   . Hypertension     Echocardiogram 11/19/11: Difficult acoustic windows, EF 60-65%, normal LV wall thickness, grade 1 diastolic dysfunction  . Vertigo   . Tubular adenoma of colon 06/2011  . Colitis   . Multinodular thyroid     Goiter s/p thyroidectomy in 2007 with post-op  hypocalcemia and post-op hypothyroidism/hypoparathyroidism with hypocalcemia  . CAD (coronary artery disease)     a) NSTEMI 10/2011: LHC 11/19/11: pLAD 30%, oOM 60%, AVCFX 30%, CFX after OM2 30%, pRCA 60 and 70%, then 99%, AM 80-90% with TIMI 3 flow.  PCI: Promus DES x 2 to RCA.  Marland Kitchen Post-surgical hypothyroidism   . Hypoparathyroidism   . Palpitations     a) 03/2012 - patient set up for event monitor but did not wear correctly -  she declined  wearing a repeat monitor  . Unspecified constipation 05/31/2013  . Back pain 05/31/2013  . Nocturia 05/31/2013  . Neck pain on right side 07/13/2013   Past Surgical History  Procedure Laterality Date  . Colonoscopy  Aenomatous polyps    07/05/2011  . Total thyroidectomy  2007    GOITER  . Shoulder arthroscopy w/ rotator cuff repair      right   Family History  Problem Relation Age of Onset  . Colon cancer Brother   . Cancer Brother     COLON  . Hypertension Father   . Diabetes Neg Hx   . Prostate cancer Neg Hx   . Heart disease Father   . Breast cancer Neg Hx    History  Substance Use Topics  . Smoking status: Never Smoker   . Smokeless tobacco: Never Used  . Alcohol Use: No   OB History   Grav Para Term Preterm Abortions TAB SAB Ect Mult Living   4 4 4       4      Review of Systems  Constitutional: Negative for fever, chills, diaphoresis, appetite change and fatigue.  HENT: Negative for mouth sores, sore throat and trouble swallowing.   Eyes: Negative for visual disturbance.  Respiratory: Negative for cough, chest tightness, shortness of breath and wheezing.   Cardiovascular: Positive for chest pain. Negative for leg swelling.  Gastrointestinal: Negative for nausea, vomiting,  abdominal pain, diarrhea and abdominal distention.  Endocrine: Negative for polydipsia, polyphagia and polyuria.  Genitourinary: Negative for dysuria, frequency and hematuria.  Musculoskeletal: Negative for gait problem.  Skin: Negative for color change, pallor and rash.  Neurological: Negative for dizziness, syncope, light-headedness and headaches.  Hematological: Does not bruise/bleed easily.  Psychiatric/Behavioral: Negative for behavioral problems and confusion.  All other systems reviewed and are negative.  Allergies  Review of patient's allergies indicates no known allergies.  Home Medications   Prior to Admission medications   Medication Sig Start Date End Date Taking? Authorizing  Provider  amLODipine (NORVASC) 10 MG tablet Take 1 tablet by mouth  daily 04/29/14   Fay Records, MD  aspirin EC 81 MG tablet Take 81 mg by mouth daily.   11/22/11   Dayna N Dunn, PA-C  calcitRIOL (ROCALTROL) 0.25 MCG capsule Take 3 capsules (0.75 mcg total) by mouth daily. 08/03/13   Elayne Snare, MD  calcium carbonate (TUMS - DOSED IN MG ELEMENTAL CALCIUM) 500 MG chewable tablet Chew 4 tablets by mouth 2 (two) times daily.     Historical Provider, MD  clopidogrel (PLAVIX) 75 MG tablet Take 1 tablet (75 mg total) by mouth daily with  breakfast. 04/29/14   Fay Records, MD  CYANOCOBALAMIN PO Take 1 tablet by mouth daily.    Historical Provider, MD  cyclobenzaprine (FLEXERIL) 5 MG tablet Take 5 mg by mouth at bedtime as needed. For muscle spasms 10/31/12   Burnice Logan, MD  famotidine (PEPCID) 20 MG tablet Take 1 tablet by mouth two  times daily 04/29/14   Fay Records, MD  isosorbide mononitrate (IMDUR) 60 MG 24 hr tablet Take 1 tablet by mouth  daily. 04/29/14   Fay Records, MD  levothyroxine (SYNTHROID, LEVOTHROID) 112 MCG tablet Take 1 tablet (112 mcg total) by mouth daily. 02/05/14   Elayne Snare, MD  methocarbamol (ROBAXIN) 500 MG tablet Take 1 tablet (500 mg total) by mouth 4 (four) times daily as needed. 07/13/13   Mosie Lukes, MD  NITROSTAT 0.4 MG SL tablet Dissolve 1 tablet under the tongue every 5 minutes as   needed for chest pain as   directed 04/29/14   Fay Records, MD  rosuvastatin (CRESTOR) 20 MG tablet Take 1 tablet (20 mg total) by mouth daily. 09/18/13   Fay Records, MD  traMADol (ULTRAM) 50 MG tablet Take 1 tablet (50 mg total) by mouth at bedtime as needed for pain. 07/13/13   Mosie Lukes, MD  vitamin D, CHOLECALCIFEROL, 400 UNITS tablet Take 1 tablet (400 Units total) by mouth 2 (two) times daily. 11/22/11   Dayna N Dunn, PA-C   Triage Vitals: BP 143/78  Pulse 65  Temp(Src) 97.6 F (36.4 C) (Oral)  Resp 16  Ht 5\' 5"  (1.651 m)  Wt 195 lb (88.451 kg)  BMI 32.45 kg/m2  SpO2  96% Physical Exam  Constitutional: She is oriented to person, place, and time. She appears well-developed and well-nourished. No distress.  HENT:  Head: Normocephalic.  Mouth/Throat: Oropharynx is clear and moist and mucous membranes are normal.  Eyes: Conjunctivae and EOM are normal. Pupils are equal, round, and reactive to light. No scleral icterus.  Neck: Normal range of motion. Neck supple. No thyromegaly present.  Cardiovascular: Normal rate and regular rhythm.  Exam reveals no gallop and no friction rub.   No murmur heard. Pulmonary/Chest: Effort normal and breath sounds normal. No respiratory distress. She has no wheezes. She has  no rales.  Abdominal: Soft. Bowel sounds are normal. She exhibits no distension. There is no tenderness. There is no rebound.  Musculoskeletal: Normal range of motion.  Neurological: She is alert and oriented to person, place, and time.  Skin: Skin is warm and dry. No rash noted.  Psychiatric: She has a normal mood and affect. Her behavior is normal.   ED Course  Procedures (including critical care time) DIAGNOSTIC STUDIES: Oxygen Saturation is 96% on RA, adequate by my interpretation.    COORDINATION OF CARE: 8:19 PM Discussed treatment plan with pt at bedside and pt agreed to plan.  Labs Review Labs Reviewed  COMPREHENSIVE METABOLIC PANEL - Abnormal; Notable for the following:    Glucose, Bld 100 (*)    BUN 27 (*)    Creatinine, Ser 1.40 (*)    Total Bilirubin <0.2 (*)    GFR calc non Af Amer 38 (*)    GFR calc Af Amer 44 (*)    All other components within normal limits  URINALYSIS, ROUTINE W REFLEX MICROSCOPIC - Abnormal; Notable for the following:    APPearance TURBID (*)    Leukocytes, UA TRACE (*)    All other components within normal limits  URINE MICROSCOPIC-ADD ON - Abnormal; Notable for the following:    Squamous Epithelial / LPF FEW (*)    All other components within normal limits  CBC WITH DIFFERENTIAL  TROPONIN I   Imaging  Review No results found.   EKG Interpretation   Date/Time:  Monday May 24 2014 18:58:26 EDT Ventricular Rate:  66 PR Interval:  174 QRS Duration: 96 QT Interval:  436 QTC Calculation: 457 R Axis:   22 Text Interpretation:  Normal sinus rhythm Low voltage QRS Borderline ECG  Confirmed by Jeneen Rinks  MD, [place] ([id number]) on 05/24/2014 7:49:34 PM      MDM   Final diagnoses:  PVC (premature ventricular contraction)    EKG showed no changes. Normal electrolytes. Normal troponin. This very classically sounds like single PVC. Episode of more than a "strange feeling" like a movement in her chest. No actual pain. She is probably appropriate for outpatient treatment. Followup with her physician if these become more frequent, or develops actual  pain or additional symptoms.  I personally performed the services described in this documentation, which was scribed in my presence. The recorded information has been reviewed and is accurate.    Tanna Furry, MD 05/24/14 2149

## 2014-06-03 ENCOUNTER — Telehealth: Payer: Self-pay | Admitting: Internal Medicine

## 2014-06-03 NOTE — Telephone Encounter (Signed)
Patient asks for a letter to be mailed to her from Dr. Harrington Challenger that states she is not able to work past 7 pm She states Dr. Harrington Challenger knows the reason and she did not discuss with me. Also requested follow up appointment in August, scheduled her for 07/26/14. Will route to Dr. Harrington Challenger for advice.

## 2014-06-03 NOTE — Telephone Encounter (Signed)
New message     Pt request to talk to Dr Harrington Challenger.  She would not tell me what she wanted.

## 2014-06-07 ENCOUNTER — Encounter: Payer: Self-pay | Admitting: Internal Medicine

## 2014-06-08 ENCOUNTER — Encounter: Payer: Self-pay | Admitting: *Deleted

## 2014-06-08 ENCOUNTER — Telehealth: Payer: Self-pay | Admitting: Internal Medicine

## 2014-06-08 NOTE — Telephone Encounter (Signed)
Spoke with pt, letter has been printed and mailed to pts home address per her request

## 2014-06-08 NOTE — Telephone Encounter (Signed)
Patient called stating she needs a letter from Dr [name]. I ask her what type of letter she informed me it was none of my business. Please call and advise.

## 2014-06-28 ENCOUNTER — Encounter: Payer: Self-pay | Admitting: Family Medicine

## 2014-06-28 ENCOUNTER — Ambulatory Visit (INDEPENDENT_AMBULATORY_CARE_PROVIDER_SITE_OTHER): Payer: Medicare Other | Admitting: Family Medicine

## 2014-06-28 VITALS — BP 124/70 | HR 62 | Temp 98.0°F | Ht 64.0 in | Wt 192.1 lb

## 2014-06-28 DIAGNOSIS — I498 Other specified cardiac arrhythmias: Secondary | ICD-10-CM

## 2014-06-28 DIAGNOSIS — M5489 Other dorsalgia: Secondary | ICD-10-CM

## 2014-06-28 DIAGNOSIS — E785 Hyperlipidemia, unspecified: Secondary | ICD-10-CM

## 2014-06-28 DIAGNOSIS — E039 Hypothyroidism, unspecified: Secondary | ICD-10-CM

## 2014-06-28 DIAGNOSIS — M542 Cervicalgia: Secondary | ICD-10-CM

## 2014-06-28 DIAGNOSIS — G8929 Other chronic pain: Secondary | ICD-10-CM

## 2014-06-28 DIAGNOSIS — R001 Bradycardia, unspecified: Secondary | ICD-10-CM

## 2014-06-28 DIAGNOSIS — M549 Dorsalgia, unspecified: Secondary | ICD-10-CM

## 2014-06-28 MED ORDER — LEVOTHYROXINE SODIUM 112 MCG PO TABS: 112.0000 ug | ORAL_TABLET | Freq: Every day | ORAL | Status: DC

## 2014-06-28 MED ORDER — AMLODIPINE BESYLATE 10 MG PO TABS: 10.0000 mg | ORAL_TABLET | Freq: Every day | ORAL | Status: DC

## 2014-06-28 MED ORDER — CLOPIDOGREL BISULFATE 75 MG PO TABS: 75.0000 mg | ORAL_TABLET | Freq: Once | ORAL | Status: DC

## 2014-06-28 MED ORDER — ROSUVASTATIN CALCIUM 20 MG PO TABS
20.0000 mg | ORAL_TABLET | Freq: Every day | ORAL | Status: DC
Start: 2014-06-28 — End: 2015-01-03

## 2014-06-28 NOTE — Patient Instructions (Signed)
Tylenol/Acetaminophen 500 mg tab 2 twice a day for a month    Back Pain, Adult Low back pain is very common. About 1 in 5 people have back pain.The cause of low back pain is rarely dangerous. The pain often gets better over time.About half of people with a sudden onset of back pain feel better in just 2 weeks. About 8 in 10 people feel better by 6 weeks.  CAUSES Some common causes of back pain include:  Strain of the muscles or ligaments supporting the spine.  Wear and tear (degeneration) of the spinal discs.  Arthritis.  Direct injury to the back. DIAGNOSIS Most of the time, the direct cause of low back pain is not known.However, back pain can be treated effectively even when the exact cause of the pain is unknown.Answering your caregiver's questions about your overall health and symptoms is one of the most accurate ways to make sure the cause of your pain is not dangerous. If your caregiver needs more information, he or she may order lab work or imaging tests (X-rays or MRIs).However, even if imaging tests show changes in your back, this usually does not require surgery. HOME CARE INSTRUCTIONS For many people, back pain returns.Since low back pain is rarely dangerous, it is often a condition that people can learn to [facility] their own.   Remain active. It is stressful on the back to sit or stand in one place. Do not sit, drive, or stand in one place for more than 30 minutes at a time. Take short walks on level surfaces as soon as pain allows.Try to increase the length of time you walk each day.  Do not stay in bed.Resting more than 1 or 2 days can delay your recovery.  Do not avoid exercise or work.Your body is made to move.It is not dangerous to be active, even though your back may hurt.Your back will likely heal faster if you return to being active before your pain is gone.  Pay attention to your body when you bend and lift. Many people have less discomfortwhen lifting if  they bend their knees, keep the load close to their bodies,and avoid twisting. Often, the most comfortable positions are those that put less stress on your recovering back.  Find a comfortable position to sleep. Use a firm mattress and lie on your side with your knees slightly bent. If you lie on your back, put a pillow under your knees.  Only take over-the-counter or prescription medicines as directed by your caregiver. Over-the-counter medicines to reduce pain and inflammation are often the most helpful.Your caregiver may prescribe muscle relaxant drugs.These medicines help dull your pain so you can more quickly return to your normal activities and healthy exercise.  Put ice on the injured area.  Put ice in a plastic bag.  Place a towel between your skin and the bag.  Leave the ice on for 15-20 minutes, 03-04 times a day for the first 2 to 3 days. After that, ice and heat may be alternated to reduce pain and spasms.  Ask your caregiver about trying back exercises and gentle massage. This may be of some benefit.  Avoid feeling anxious or stressed.Stress increases muscle tension and can worsen back pain.It is important to recognize when you are anxious or stressed and learn ways to manage it.Exercise is a great option. SEEK MEDICAL CARE IF:  You have pain that is not relieved with rest or medicine.  You have pain that does not improve in 1  week.  You have new symptoms.  You are generally not feeling well. SEEK IMMEDIATE MEDICAL CARE IF:   You have pain that radiates from your back into your legs.  You develop new bowel or bladder control problems.  You have unusual weakness or numbness in your arms or legs.  You develop nausea or vomiting.  You develop abdominal pain.  You feel faint. Document Released: 12/10/2005 Document Revised: 06/10/2012 Document Reviewed: 04/30/2011 [facility] Patient Information 2015 [place], [place]. This information is not intended to replace  advice given to you by your health care provider. Make sure you discuss any questions you have with your health care provider.

## 2014-06-28 NOTE — Assessment & Plan Note (Signed)
On Levothyroxine, continue to monitor 

## 2014-06-28 NOTE — Progress Notes (Signed)
Patient ID: [name], female   DOB: October 05, 1946, 68 y.o.   MRN: [id number] [name] [id number] Oct 22, 1946 06/28/2014      Progress Note-Follow Up  Subjective  Chief Complaint  Chief Complaint  Patient presents with  . Follow-up    3 month    HPI  Patient is a 68 year old female in today for routine medical care. She is in today with followup. Her major complaint is musculoskeletal. She has chronic neck pain which she sees [facility] for orthopedics 4. She has had steroid injections which get some temporary relief but also can cause some left upper extremity radicular symptoms. She did shots in the low back which also give her some relief but she does still have some right lower extremity radicular symptoms into the right knee. She would like to avoid surgical intervention and has tried chiropractic with no good results. No incontinence. No injury or recent worsening of symptoms. Symptoms are worse after a long work shift on her feet. Otherwise feeling well. No recent illness. Denies CP/palp/SOB/HA/congestion/fevers/GI or GU c/o. Taking meds as prescribed   Past Medical History  Diagnosis Date  . GERD (gastroesophageal reflux disease)     occasional  . Hyperlipidemia   . Hypertension     Echocardiogram 11/19/11: Difficult acoustic windows, EF 60-65%, normal LV wall thickness, grade 1 diastolic dysfunction  . Vertigo   . Tubular adenoma of colon 06/2011  . Colitis   . Multinodular thyroid     Goiter s/p thyroidectomy in 2007 with post-op  hypocalcemia and post-op hypothyroidism/hypoparathyroidism with hypocalcemia  . CAD (coronary artery disease)     a) NSTEMI 10/2011: LHC 11/19/11: pLAD 30%, oOM 60%, AVCFX 30%, CFX after OM2 30%, pRCA 60 and 70%, then 99%, AM 80-90% with TIMI 3 flow.  PCI: Promus DES x 2 to RCA.  Marland Kitchen Post-surgical hypothyroidism   . Hypoparathyroidism   . Palpitations     a) 03/2012 - patient set up for event monitor but did not wear correctly -  she declined  wearing a repeat monitor  . Unspecified constipation 05/31/2013  . Back pain 05/31/2013  . Nocturia 05/31/2013  . Neck pain on right side 07/13/2013    Past Surgical History  Procedure Laterality Date  . Colonoscopy  Aenomatous polyps    07/05/2011  . Total thyroidectomy  2007    GOITER  . Shoulder arthroscopy w/ rotator cuff repair      right    Family History  Problem Relation Age of Onset  . Colon cancer Brother   . Cancer Brother     COLON  . Hypertension Father   . Diabetes Neg Hx   . Prostate cancer Neg Hx   . Heart disease Father   . Breast cancer Neg Hx     History   Social History  . Marital Status: Single    Spouse Name: N/A    Number of Children: N/A  . Years of Education: N/A   Occupational History  . Not on file.   Social History Main Topics  . Smoking status: Never Smoker   . Smokeless tobacco: Never Used  . Alcohol Use: No  . Drug Use: No  . Sexual Activity: Yes   Other Topics Concern  . Not on file   Social History Narrative  . No narrative on file    Current Outpatient Prescriptions on File Prior to Visit  Medication Sig Dispense Refill  . aspirin EC 81 MG tablet Take 81 mg  by mouth daily.        . calcitRIOL (ROCALTROL) 0.25 MCG capsule Take 3 capsules (0.75 mcg total) by mouth daily.  360 capsule  3  . calcium carbonate (TUMS - DOSED IN MG ELEMENTAL CALCIUM) 500 MG chewable tablet Chew 4 tablets by mouth 2 (two) times daily.       . CYANOCOBALAMIN PO Take 1 tablet by mouth daily.      . cyclobenzaprine (FLEXERIL) 5 MG tablet Take 5 mg by mouth at bedtime as needed. For muscle spasms      . famotidine (PEPCID) 20 MG tablet Take 1 tablet by mouth two  times daily  60 tablet  6  . isosorbide mononitrate (IMDUR) 60 MG 24 hr tablet Take 1 tablet by mouth  daily.  30 tablet  5  . methocarbamol (ROBAXIN) 500 MG tablet Take 1 tablet (500 mg total) by mouth 4 (four) times daily as needed.  90 tablet  1  . NITROSTAT 0.4 MG SL tablet Dissolve 1 tablet  under the tongue every 5 minutes as   needed for chest pain as   directed  25 tablet  2  . traMADol (ULTRAM) 50 MG tablet Take 1 tablet (50 mg total) by mouth at bedtime as needed for pain.  30 tablet  1  . vitamin D, CHOLECALCIFEROL, 400 UNITS tablet Take 1 tablet (400 Units total) by mouth 2 (two) times daily.  60 tablet  1   No current facility-administered medications on file prior to visit.    No Known Allergies  Review of Systems  Review of Systems  Constitutional: Negative for fever and malaise/fatigue.  HENT: Negative for congestion.   Eyes: Negative for discharge.  Respiratory: Negative for shortness of breath.   Cardiovascular: Negative for chest pain, palpitations and leg swelling.  Gastrointestinal: Negative for nausea, abdominal pain and diarrhea.  Genitourinary: Negative for dysuria.  Musculoskeletal: Negative for falls.  Skin: Negative for rash.  Neurological: Negative for loss of consciousness and headaches.  Endo/Heme/Allergies: Negative for polydipsia.  Psychiatric/Behavioral: Negative for depression and suicidal ideas. The patient is not nervous/anxious and does not have insomnia.     Objective  BP 124/70  Pulse 62  Temp(Src) 98 F (36.7 C) (Oral)  Ht 5\' 4"  (1.626 m)  Wt 192 lb 1.3 oz (87.127 kg)  BMI 32.95 kg/m2  SpO2 98%  Physical Exam  Physical Exam  Constitutional: She is oriented to person, place, and time and well-developed, well-nourished, and in no distress. No distress.  HENT:  Head: Normocephalic and atraumatic.  Eyes: Conjunctivae are normal.  Neck: Neck supple. No thyromegaly present.  Cardiovascular: Normal rate, regular rhythm and normal heart sounds.   No murmur heard. Pulmonary/Chest: Effort normal and breath sounds normal. She has no wheezes.  Abdominal: She exhibits no distension and no mass.  Musculoskeletal: She exhibits no edema.  Lymphadenopathy:    She has no cervical adenopathy.  Neurological: She is alert and oriented to  person, place, and time.  Skin: Skin is warm and dry. No rash noted. She is not diaphoretic.  Psychiatric: Memory, affect and judgment normal.    Lab Results  Component Value Date   TSH 1.06 05/05/2014   Lab Results  Component Value Date   WBC 8.3 05/24/2014   HGB 13.9 05/24/2014   HCT 41.3 05/24/2014   MCV 86.6 05/24/2014   PLT 213 05/24/2014   Lab Results  Component Value Date   CREATININE 1.40* 05/24/2014   BUN 27* 05/24/2014  NA 145 05/24/2014   K 3.9 05/24/2014   CL 102 05/24/2014   CO2 28 05/24/2014   Lab Results  Component Value Date   ALT 16 05/24/2014   AST 17 05/24/2014   ALKPHOS 88 05/24/2014   BILITOT <0.2* 05/24/2014   Lab Results  Component Value Date   CHOL 169 03/15/2014   Lab Results  Component Value Date   HDL 47 03/15/2014   Lab Results  Component Value Date   LDLCALC 72 03/15/2014   Lab Results  Component Value Date   TRIG 250* 03/15/2014   Lab Results  Component Value Date   CHOLHDL 3.6 03/15/2014     Assessment & Plan  HYPERLIPIDEMIA-MIXED Tolerating statin, encouraged heart healthy diet, avoid trans fats, minimize simple carbs and saturated fats. Increase exercise as tolerated, has appt with cardiology later this summer and plans to recheck cholesterol at that time.  Unspecified hypothyroidism On Levothyroxine, continue to monitor  Back pain Has been seeing [place] [facility] and gotten some relief from shots in neck and low back but symptoms return. Will refer for PT at this time to see if helpful has LUE radicular  Symptoms after shots at times which resolve. And has RLE radiculopathy constantly. Pain in right knee is attributed to back. Try Salon Pas patches and Tylenol BID, never tried Tramadol  Anemia Increase leafy greens, consider increased lean red meat and using cast iron cookware. Continue to monitor, report any concerns  Bradycardia RRR   Somewhat he did have to stay in the

## 2014-06-28 NOTE — Assessment & Plan Note (Addendum)
Has been seeing [place] [facility] and gotten some relief from shots in neck and low back but symptoms return. Will refer for PT at this time to see if helpful has LUE radicular  Symptoms after shots at times which resolve. And has RLE radiculopathy constantly. Pain in right knee is attributed to back. Try Salon Pas patches and Tylenol BID, never tried Tramadol

## 2014-06-28 NOTE — Assessment & Plan Note (Signed)
Tolerating statin, encouraged heart healthy diet, avoid trans fats, minimize simple carbs and saturated fats. Increase exercise as tolerated, has appt with cardiology later this summer and plans to recheck cholesterol at that time.

## 2014-06-28 NOTE — Assessment & Plan Note (Signed)
Increase leafy greens, consider increased lean red meat and using cast iron cookware. Continue to monitor, report any concerns 

## 2014-06-28 NOTE — Assessment & Plan Note (Signed)
RRR 

## 2014-06-28 NOTE — Progress Notes (Signed)
Pre visit review using our clinic review tool, if applicable. No additional management support is needed unless otherwise documented below in the visit note. 

## 2014-07-07 ENCOUNTER — Telehealth: Payer: Self-pay | Admitting: *Deleted

## 2014-07-07 NOTE — Telephone Encounter (Signed)
Received fax from [facility] requesting verification of plavix Rx. Current directions state take 1 tablet once. Previous rx stated 1 tablet once a day. Form faxed with updated direction. Med list updated.

## 2014-07-09 ENCOUNTER — Telehealth: Payer: Self-pay

## 2014-07-09 ENCOUNTER — Encounter: Payer: Self-pay | Admitting: Internal Medicine

## 2014-07-09 ENCOUNTER — Telehealth: Payer: Self-pay | Admitting: *Deleted

## 2014-07-09 ENCOUNTER — Telehealth: Payer: Self-pay | Admitting: Internal Medicine

## 2014-07-09 NOTE — Telephone Encounter (Signed)
Lm for pt per Dr. Harrington Challenger may stop Plavix 5 days prior to colonoscopy that is scheduled for 7/30.  Lm also with Dr. Lynne Leader office that may stop 5 days prior to colonoscopy.

## 2014-07-09 NOTE — Telephone Encounter (Signed)
Calling stating she is having a colonoscopy on 7/30 w/Dr. Fuller Plan.  They need note stating she can stop her Plavix 5 days prior to procedure but want note by Mon 7/20.  Has not had any procedures since 07/17/12/ Advised will send to Dr. Harrington Challenger for review.

## 2014-07-09 NOTE — Telephone Encounter (Signed)
Pt does not want to schedule with physician extender.  She prefers to contact her PCP or cardiologist for order to stop or continue PLAVIX.  Will check with our MD/M Fuller Plan for instruction.

## 2014-07-09 NOTE — Telephone Encounter (Signed)
New problem   Pt having colonoscopy 07/22/14 and need to know can she stop her plavix for 5 days. Please call pt.

## 2014-07-09 NOTE — Telephone Encounter (Signed)
She needs a recent, within 2-3 months, office visit with her Cardiologist for Plavix clearance or a recent visit with [place] and our contacting to her Cardiologist for clearance.

## 2014-07-12 ENCOUNTER — Ambulatory Visit (AMBULATORY_SURGERY_CENTER): Payer: Self-pay

## 2014-07-12 ENCOUNTER — Telehealth: Payer: Self-pay

## 2014-07-12 VITALS — Ht 64.0 in | Wt 192.4 lb

## 2014-07-12 DIAGNOSIS — Z860100 Personal history of colon polyps, unspecified: Secondary | ICD-10-CM

## 2014-07-12 MED ORDER — MOVIPREP 100 G PO SOLR: 1.0000 | Freq: Once | ORAL | Status: DC

## 2014-07-12 NOTE — Telephone Encounter (Signed)
PT REPORTED SHE IS A DIFFICULT INTUBATION.  PLEASE advise MS's RN if pt can not be done at [facility] (see progress note from previsit).

## 2014-07-12 NOTE — Telephone Encounter (Signed)
[name],  Difficult intubations do not qualify for care at [facility].  I am not aware of who is this MD's nurse.  Please refer to the hospital.  Thanks,  [name]

## 2014-07-12 NOTE — Progress Notes (Signed)
No allergies to eggs or soy No diet/weight loss meds No home oxygen  Has email. Emmi instructions given for colonoscopy  Pt done here in 2012 without incident but has shoulder surgery in 2009 and was told she is a DIFFICULT INTUBATION.

## 2014-07-13 NOTE — Telephone Encounter (Signed)
See my phone note from 7/17. I asked that she see her Cardiologist or have an office visit with [place] within 2-3 months given Plavix and history difficult intubation. She will need to be done in the hospital.

## 2014-07-13 NOTE — Telephone Encounter (Signed)
Dr Fuller Plan: do you want pt to have OV or can she be scheduled as direct hospital procedure?    There is also a phone encounter from 7/17 about Plavix clearance.  Pt has clearance from Dr Harrington Challenger to hold Plavix 5 days before procedure.  Thanks, Juliann Pulse

## 2014-07-14 ENCOUNTER — Ambulatory Visit: Payer: Medicare Other | Attending: Family Medicine | Admitting: Rehabilitation

## 2014-07-14 DIAGNOSIS — I10 Essential (primary) hypertension: Secondary | ICD-10-CM | POA: Diagnosis not present

## 2014-07-14 DIAGNOSIS — D649 Anemia, unspecified: Secondary | ICD-10-CM | POA: Insufficient documentation

## 2014-07-14 DIAGNOSIS — K219 Gastro-esophageal reflux disease without esophagitis: Secondary | ICD-10-CM | POA: Diagnosis not present

## 2014-07-14 DIAGNOSIS — E039 Hypothyroidism, unspecified: Secondary | ICD-10-CM | POA: Diagnosis not present

## 2014-07-14 DIAGNOSIS — E7800 Pure hypercholesterolemia, unspecified: Secondary | ICD-10-CM | POA: Insufficient documentation

## 2014-07-14 DIAGNOSIS — M542 Cervicalgia: Secondary | ICD-10-CM | POA: Diagnosis not present

## 2014-07-14 DIAGNOSIS — M545 Low back pain, unspecified: Secondary | ICD-10-CM | POA: Diagnosis not present

## 2014-07-14 DIAGNOSIS — I2510 Atherosclerotic heart disease of native coronary artery without angina pectoris: Secondary | ICD-10-CM | POA: Insufficient documentation

## 2014-07-14 DIAGNOSIS — M549 Dorsalgia, unspecified: Secondary | ICD-10-CM | POA: Insufficient documentation

## 2014-07-14 DIAGNOSIS — IMO0001 Reserved for inherently not codable concepts without codable children: Secondary | ICD-10-CM | POA: Diagnosis not present

## 2014-07-14 NOTE — Telephone Encounter (Signed)
Pt has not seen her cardiologist since March 2015.  New pt appt scheduled with Tye Savoy 7/24. Colonoscopy scheduled for 7/31 [place] cancelled.

## 2014-07-16 ENCOUNTER — Telehealth: Payer: Self-pay | Admitting: *Deleted

## 2014-07-16 ENCOUNTER — Encounter: Payer: Self-pay | Admitting: Nurse Practitioner

## 2014-07-16 ENCOUNTER — Ambulatory Visit (INDEPENDENT_AMBULATORY_CARE_PROVIDER_SITE_OTHER): Payer: Medicare Other | Admitting: Nurse Practitioner

## 2014-07-16 VITALS — BP 124/64 | HR 76 | Ht 63.0 in | Wt 193.5 lb

## 2014-07-16 DIAGNOSIS — Z1211 Encounter for screening for malignant neoplasm of colon: Secondary | ICD-10-CM

## 2014-07-16 DIAGNOSIS — Z860100 Personal history of colon polyps, unspecified: Secondary | ICD-10-CM | POA: Insufficient documentation

## 2014-07-16 NOTE — Patient Instructions (Signed)
You have been scheduled for a colonoscopy at [facility] with Dr. Stark. Please follow written instructions given to you at your visit today.  Please pick up your prep kit at the pharmacy within the next 1-3 days. If you use inhalers (even only as needed), please bring them with you on the day of your procedure. Your physician has requested that you go to www.startemmi.com and enter the access code given to you at your visit today. This web site gives a general overview about your procedure. However, you should still follow specific instructions given to you by our office regarding your preparation for the procedure.    

## 2014-07-16 NOTE — Telephone Encounter (Signed)
PATIENT NOTIFIED OKAY TO HOLD PLAVIX FIVE DAYS PRIOR TO PROCEDURE. PATIENT VERBALIZED UNDERSTANDING.

## 2014-07-16 NOTE — Telephone Encounter (Signed)
Note sent to [name]  OK to stop prior

## 2014-07-16 NOTE — Progress Notes (Signed)
HPI :  Patient is a 68 year old female known to Dr. Fuller Plan for history of adenomatous colon polyps. Patient due for surveillance colonoscopy. She has a history of coronary artery disease /MI , status post RCA stent placement. Left heart catheter July 2013 with findings below: Final Conclusions:  1. Continued patency of the RCA stents to the distal RCA  2. No change in the LCA system with collateral flow to the distal AM territory (which supplies the distal PDA territory)  3. Subtotal ostial occlusion of the acute marginal of the RCA (See above Recommendations:  1. Would favor medical therapy. If symptoms are not controlled, an attempt can be made at opening the acute marginal, but it would come likely at the expense of some deformity of the RCA stent  Patient has no gastrointestinal complaints. No chest pain. Patient did report that she is a difficult intubation. Our CRNA reviewed her records, procedure will need to be done at the hospital. Patient tells me that her cardiologist, Dr. Harrington Challenger, gave written approval for her to hold the Plavix for 5 days prior to the colonoscopy, I will need to locate that note   Past Medical History  Diagnosis Date  . GERD (gastroesophageal reflux disease)     occasional  . Hyperlipidemia   . Hypertension     Echocardiogram 11/19/11: Difficult acoustic windows, EF 60-65%, normal LV wall thickness, grade 1 diastolic dysfunction  . Vertigo   . Tubular adenoma of colon 06/2011  . Colitis   . Multinodular thyroid     Goiter s/p thyroidectomy in 2007 with post-op  hypocalcemia and post-op hypothyroidism/hypoparathyroidism with hypocalcemia  . CAD (coronary artery disease)     a) NSTEMI 10/2011: LHC 11/19/11: pLAD 30%, oOM 60%, AVCFX 30%, CFX after OM2 30%, pRCA 60 and 70%, then 99%, AM 80-90% with TIMI 3 flow.  PCI: Promus DES x 2 to RCA.  Marland Kitchen Post-surgical hypothyroidism   . Hypoparathyroidism   . Palpitations     a) 03/2012 - patient set up for event monitor but  did not wear correctly -  she declined wearing a repeat monitor  . Unspecified constipation 05/31/2013  . Back pain 05/31/2013  . Nocturia 05/31/2013  . Neck pain on right side 07/13/2013    Family History  Problem Relation Age of Onset  . Colon cancer Brother   . Cancer Brother     COLON  . Hypertension Father   . Diabetes Neg Hx   . Prostate cancer Neg Hx   . Heart disease Father   . Breast cancer Neg Hx    History  Substance Use Topics  . Smoking status: Never Smoker   . Smokeless tobacco: Never Used  . Alcohol Use: No   Current Outpatient Prescriptions  Medication Sig Dispense Refill  . amLODipine (NORVASC) 10 MG tablet Take 1 tablet (10 mg total) by mouth daily.  90 tablet  2  . aspirin EC 81 MG tablet Take 81 mg by mouth daily.        . calcitRIOL (ROCALTROL) 0.25 MCG capsule Take 3 capsules (0.75 mcg total) by mouth daily.  360 capsule  3  . calcium carbonate (TUMS - DOSED IN MG ELEMENTAL CALCIUM) 500 MG chewable tablet Chew 4 tablets by mouth 2 (two) times daily.       . clopidogrel (PLAVIX) 75 MG tablet Take 1 tablet (75 mg total) by mouth once.  90 tablet  2  . CYANOCOBALAMIN PO Take 1 tablet by mouth daily.      Marland Kitchen  famotidine (PEPCID) 20 MG tablet Take 1 tablet by mouth two  times daily  60 tablet  6  . isosorbide mononitrate (IMDUR) 60 MG 24 hr tablet Take 1 tablet by mouth  daily.  30 tablet  5  . levothyroxine (SYNTHROID, LEVOTHROID) 112 MCG tablet Take 1 tablet (112 mcg total) by mouth daily.  90 tablet  2  . MOVIPREP 100 G SOLR Take 1 kit (200 g total) by mouth once.  1 kit  0  . NITROSTAT 0.4 MG SL tablet Dissolve 1 tablet under the tongue every 5 minutes as   needed for chest pain as   directed  25 tablet  2  . rosuvastatin (CRESTOR) 20 MG tablet Take 1 tablet (20 mg total) by mouth daily.  90 tablet  2  . traMADol (ULTRAM) 50 MG tablet Take 1 tablet (50 mg total) by mouth at bedtime as needed for pain.  30 tablet  1  . vitamin D, CHOLECALCIFEROL, 400 UNITS tablet  Take 1 tablet (400 Units total) by mouth 2 (two) times daily.  60 tablet  1   No current facility-administered medications for this visit.   No Known Allergies   Review of Systems: All systems reviewed and negative except where noted in HPI.   Physical Exam: BP 124/64  Pulse 76  Ht 5' 3"  (1.6 m)  Wt 193 lb 8 oz (87.771 kg)  BMI 34.29 kg/m2 Constitutional: Well-developed, white female in no acute distress. HEENT: Normocephalic and atraumatic. Conjunctivae are normal. No scleral icterus. Neck supple.  Cardiovascular: Normal rate, regular rhythm.  Pulmonary/chest: Effort normal and breath sounds normal. No wheezing, rales or rhonchi. Abdominal: Soft, nondistended, nontender. Bowel sounds active throughout. There are no masses palpable. No hepatomegaly. Extremities: no edema Lymphadenopathy: No cervical adenopathy noted. Neurological: Alert and oriented to person place and time. Skin: Skin is warm and dry. No rashes noted. Psychiatric: Normal mood and affect. Behavior is normal.   ASSESSMENT AND PLAN:  101. 69 year old female with history of adenomatous colon polyps, due for surveillance colonoscopy. Patient is already scheduled for the procedure later this month but it will need to changed from [facility] to [facility] as patient is a difficult intubation. We have apparently received a written notice from cardiologist allowing patient to hold Plavix prior to the procedure, I will need to locate that note.   2. CAD / MI / history of RCA stent. See above for last heart cath findings.

## 2014-07-16 NOTE — Telephone Encounter (Signed)
07/16/2014   RE: [name] DOB: 08-06-1946 MRN: [id number]   Dear Dr. Harrington Challenger,    We have scheduled the above patient for an Colonoscopy. Our records show that she is on anticoagulation therapy.   Please advise as to how long the patient may come off her therapy of Plavix prior to the procedure, which is scheduled for 08-09-2014.  Please fax back/ or route the completed form to Evette Georges., CMA  Sincerely,    Hope Pigeon

## 2014-07-18 NOTE — Progress Notes (Signed)
Reviewed and agree with management plan.  Naydelin Ziegler T. Seena Face, MD FACG 

## 2014-07-19 ENCOUNTER — Encounter: Payer: Self-pay | Admitting: Gastroenterology

## 2014-07-19 ENCOUNTER — Ambulatory Visit: Payer: Medicare Other | Admitting: Rehabilitation

## 2014-07-19 DIAGNOSIS — IMO0001 Reserved for inherently not codable concepts without codable children: Secondary | ICD-10-CM | POA: Diagnosis not present

## 2014-07-23 ENCOUNTER — Encounter: Payer: Medicare Other | Admitting: Gastroenterology

## 2014-07-26 ENCOUNTER — Ambulatory Visit: Payer: Medicare Other | Admitting: Rehabilitation

## 2014-07-26 ENCOUNTER — Ambulatory Visit (INDEPENDENT_AMBULATORY_CARE_PROVIDER_SITE_OTHER): Payer: Medicare Other | Admitting: Internal Medicine

## 2014-07-26 ENCOUNTER — Encounter: Payer: Self-pay | Admitting: Internal Medicine

## 2014-07-26 VITALS — BP 132/71 | HR 75 | Ht 63.0 in | Wt 193.0 lb

## 2014-07-26 DIAGNOSIS — E785 Hyperlipidemia, unspecified: Secondary | ICD-10-CM

## 2014-07-26 DIAGNOSIS — I2510 Atherosclerotic heart disease of native coronary artery without angina pectoris: Secondary | ICD-10-CM

## 2014-07-26 NOTE — Progress Notes (Signed)
HPI Patient is a 68 yo with a history of CAD Underwent LHC on 07/17/12 Patent RCA stent LCA with collaterals to R acute marginal were unchanged. Subtotal occlusion of acute marginal (exits mid stent). Recomm for medical Rx   Since I saw her in clinic she has done wel lfrom a cardiac standpoint.  Denies CP  Breathing is OK   No Known Allergies  Current Outpatient Prescriptions  Medication Sig Dispense Refill  . amLODipine (NORVASC) 10 MG tablet Take 1 tablet (10 mg total) by mouth daily.  90 tablet  2  . aspirin EC 81 MG tablet Take 81 mg by mouth daily.        . calcitRIOL (ROCALTROL) 0.25 MCG capsule Take 3 capsules (0.75 mcg total) by mouth daily.  360 capsule  3  . calcium carbonate (TUMS - DOSED IN MG ELEMENTAL CALCIUM) 500 MG chewable tablet Chew 4 tablets by mouth 2 (two) times daily.       . clopidogrel (PLAVIX) 75 MG tablet Take 1 tablet (75 mg total) by mouth once.  90 tablet  2  . CYANOCOBALAMIN PO Take 1 tablet by mouth daily.      . famotidine (PEPCID) 20 MG tablet Take 1 tablet by mouth two  times daily  60 tablet  6  . isosorbide mononitrate (IMDUR) 60 MG 24 hr tablet Take 1 tablet by mouth  daily.  30 tablet  5  . levothyroxine (SYNTHROID, LEVOTHROID) 112 MCG tablet Take 1 tablet (112 mcg total) by mouth daily.  90 tablet  2  . MOVIPREP 100 G SOLR Take 1 kit (200 g total) by mouth once.  1 kit  0  . NITROSTAT 0.4 MG SL tablet Dissolve 1 tablet under the tongue every 5 minutes as   needed for chest pain as   directed  25 tablet  2  . rosuvastatin (CRESTOR) 20 MG tablet Take 1 tablet (20 mg total) by mouth daily.  90 tablet  2  . traMADol (ULTRAM) 50 MG tablet Take 1 tablet (50 mg total) by mouth at bedtime as needed for pain.  30 tablet  1  . vitamin D, CHOLECALCIFEROL, 400 UNITS tablet Take 1 tablet (400 Units total) by mouth 2 (two) times daily.  60 tablet  1   No current facility-administered medications for this visit.    Past Medical History  Diagnosis Date  . GERD  (gastroesophageal reflux disease)     occasional  . Hyperlipidemia   . Hypertension     Echocardiogram 11/19/11: Difficult acoustic windows, EF 60-65%, normal LV wall thickness, grade 1 diastolic dysfunction  . Vertigo   . Tubular adenoma of colon 06/2011  . Colitis   . Multinodular thyroid     Goiter s/p thyroidectomy in 2007 with post-op  hypocalcemia and post-op hypothyroidism/hypoparathyroidism with hypocalcemia  . CAD (coronary artery disease)     a) NSTEMI 10/2011: LHC 11/19/11: pLAD 30%, oOM 60%, AVCFX 30%, CFX after OM2 30%, pRCA 60 and 70%, then 99%, AM 80-90% with TIMI 3 flow.  PCI: Promus DES x 2 to RCA.  Marland Kitchen Post-surgical hypothyroidism   . Hypoparathyroidism   . Palpitations     a) 03/2012 - patient set up for event monitor but did not wear correctly -  she declined wearing a repeat monitor  . Unspecified constipation 05/31/2013  . Back pain 05/31/2013  . Nocturia 05/31/2013  . Neck pain on right side 07/13/2013    Past Surgical History  Procedure Laterality Date  .  Colonoscopy  Aenomatous polyps    07/05/2011  . Total thyroidectomy  2007    GOITER  . Shoulder arthroscopy w/ rotator cuff repair      right    Family History  Problem Relation Age of Onset  . Colon cancer Brother   . Cancer Brother     COLON  . Hypertension Father   . Diabetes Neg Hx   . Prostate cancer Neg Hx   . Heart disease Father   . Breast cancer Neg Hx     History   Social History  . Marital Status: Single    Spouse Name: N/A    Number of Children: N/A  . Years of Education: N/A   Occupational History  . Not on file.   Social History Main Topics  . Smoking status: Never Smoker   . Smokeless tobacco: Never Used  . Alcohol Use: No  . Drug Use: No  . Sexual Activity: Yes   Other Topics Concern  . Not on file   Social History Narrative  . No narrative on file    Review of Systems:  All systems reviewed.  They are negative to the above problem except as previously stated.  Vital  Signs: BP 132/71  Pulse 75  Ht _0  (1.6 m)  Wt 193 lb (87.544 kg)  BMI 34.20 kg/m2  Physical Exam Patient is in NAD HEENT:  Normocephalic, atraumatic. EOMI, PERRLA.  Neck: JVP is normal.  No bruits.  Lungs: clear to auscultation. No rales no wheezes.  Heart: Regular rate and rhythm. Normal S1, S2. No S3.   No significant murmurs. PMI not displaced.  Abdomen:  Supple, nontender. Normal bowel sounds. No masses. No hepatomegaly.  Extremities:   Good distal pulses throughout. No lower extremity edema.  Musculoskeletal :moving all extremities.  Neuro:   alert and oriented x3.  CN II-XII grossly intact.   Assessment and Plan:  1.  CAD  No symptoms to suggest ischemia  Follow.    2.  HL   Keep on statin  3.  HTN  Good control.  Tentative f/u in 6 months.

## 2014-07-26 NOTE — Patient Instructions (Signed)
Your physician recommends that you continue on your current medications as directed. Please refer to the Current Medication list given to you today. Your physician wants you to follow-up in: 1 year with Dr. Ross.  You will receive a reminder letter in the mail two months in advance. If you don't receive a letter, please call our office to schedule the follow-up appointment.  

## 2014-07-28 ENCOUNTER — Encounter: Payer: Self-pay | Admitting: Gastroenterology

## 2014-08-02 ENCOUNTER — Telehealth: Payer: Self-pay | Admitting: Gastroenterology

## 2014-08-02 ENCOUNTER — Ambulatory Visit: Payer: Medicare Other | Attending: Family Medicine | Admitting: Rehabilitation

## 2014-08-02 DIAGNOSIS — K219 Gastro-esophageal reflux disease without esophagitis: Secondary | ICD-10-CM | POA: Insufficient documentation

## 2014-08-02 DIAGNOSIS — M545 Low back pain, unspecified: Secondary | ICD-10-CM | POA: Insufficient documentation

## 2014-08-02 DIAGNOSIS — M542 Cervicalgia: Secondary | ICD-10-CM | POA: Insufficient documentation

## 2014-08-02 DIAGNOSIS — E7800 Pure hypercholesterolemia, unspecified: Secondary | ICD-10-CM | POA: Insufficient documentation

## 2014-08-02 DIAGNOSIS — I2510 Atherosclerotic heart disease of native coronary artery without angina pectoris: Secondary | ICD-10-CM | POA: Insufficient documentation

## 2014-08-02 DIAGNOSIS — D649 Anemia, unspecified: Secondary | ICD-10-CM | POA: Insufficient documentation

## 2014-08-02 DIAGNOSIS — M549 Dorsalgia, unspecified: Secondary | ICD-10-CM | POA: Insufficient documentation

## 2014-08-02 DIAGNOSIS — I10 Essential (primary) hypertension: Secondary | ICD-10-CM | POA: Insufficient documentation

## 2014-08-02 DIAGNOSIS — IMO0001 Reserved for inherently not codable concepts without codable children: Secondary | ICD-10-CM | POA: Insufficient documentation

## 2014-08-02 DIAGNOSIS — E039 Hypothyroidism, unspecified: Secondary | ICD-10-CM | POA: Insufficient documentation

## 2014-08-02 NOTE — Telephone Encounter (Signed)
Patient states she wants to reschedule her procedure for September. Told patient Dr. Fuller Plan does not have a hospital week in September but he does have one in October if she wants to reschedule until then. Pt states she wants to reschedule. Pt rescheduled for 09/28/14 at 8:30am. Pt told to follow same instructions but write down new date and also still hold Plavix 5 days before her procedure. Pt agreed and verbalized understanding.

## 2014-08-03 ENCOUNTER — Encounter: Payer: Self-pay | Admitting: Gastroenterology

## 2014-08-09 ENCOUNTER — Ambulatory Visit: Payer: Medicare Other | Admitting: Rehabilitation

## 2014-08-09 DIAGNOSIS — IMO0001 Reserved for inherently not codable concepts without codable children: Secondary | ICD-10-CM | POA: Diagnosis not present

## 2014-08-09 DIAGNOSIS — E7800 Pure hypercholesterolemia, unspecified: Secondary | ICD-10-CM | POA: Diagnosis not present

## 2014-08-09 DIAGNOSIS — I2510 Atherosclerotic heart disease of native coronary artery without angina pectoris: Secondary | ICD-10-CM | POA: Diagnosis not present

## 2014-08-09 DIAGNOSIS — M542 Cervicalgia: Secondary | ICD-10-CM | POA: Diagnosis not present

## 2014-08-09 DIAGNOSIS — M549 Dorsalgia, unspecified: Secondary | ICD-10-CM | POA: Diagnosis not present

## 2014-08-09 DIAGNOSIS — M545 Low back pain, unspecified: Secondary | ICD-10-CM | POA: Diagnosis not present

## 2014-08-09 DIAGNOSIS — K219 Gastro-esophageal reflux disease without esophagitis: Secondary | ICD-10-CM | POA: Diagnosis not present

## 2014-08-09 DIAGNOSIS — E039 Hypothyroidism, unspecified: Secondary | ICD-10-CM | POA: Diagnosis not present

## 2014-08-09 DIAGNOSIS — I10 Essential (primary) hypertension: Secondary | ICD-10-CM | POA: Diagnosis not present

## 2014-08-09 DIAGNOSIS — D649 Anemia, unspecified: Secondary | ICD-10-CM | POA: Diagnosis not present

## 2014-08-16 ENCOUNTER — Ambulatory Visit: Payer: Medicare Other | Admitting: Rehabilitation

## 2014-09-01 ENCOUNTER — Emergency Department (HOSPITAL_BASED_OUTPATIENT_CLINIC_OR_DEPARTMENT_OTHER): Payer: Medicare Other

## 2014-09-01 ENCOUNTER — Encounter (HOSPITAL_BASED_OUTPATIENT_CLINIC_OR_DEPARTMENT_OTHER): Payer: Self-pay | Admitting: Emergency Medicine

## 2014-09-01 ENCOUNTER — Emergency Department (HOSPITAL_BASED_OUTPATIENT_CLINIC_OR_DEPARTMENT_OTHER)
Admission: EM | Admit: 2014-09-01 | Discharge: 2014-09-01 | Disposition: A | Discharge status: Home / Self Care | Payer: Medicare Other | Attending: Emergency Medicine | Admitting: Emergency Medicine

## 2014-09-01 DIAGNOSIS — Z8719 Personal history of other diseases of the digestive system: Secondary | ICD-10-CM | POA: Diagnosis not present

## 2014-09-01 DIAGNOSIS — E042 Nontoxic multinodular goiter: Secondary | ICD-10-CM | POA: Diagnosis not present

## 2014-09-01 DIAGNOSIS — Z9861 Coronary angioplasty status: Secondary | ICD-10-CM | POA: Diagnosis not present

## 2014-09-01 DIAGNOSIS — E209 Hypoparathyroidism, unspecified: Secondary | ICD-10-CM | POA: Diagnosis not present

## 2014-09-01 DIAGNOSIS — R1013 Epigastric pain: Secondary | ICD-10-CM | POA: Diagnosis not present

## 2014-09-01 DIAGNOSIS — I10 Essential (primary) hypertension: Secondary | ICD-10-CM | POA: Diagnosis not present

## 2014-09-01 DIAGNOSIS — Z7982 Long term (current) use of aspirin: Secondary | ICD-10-CM | POA: Diagnosis not present

## 2014-09-01 DIAGNOSIS — R110 Nausea: Secondary | ICD-10-CM | POA: Insufficient documentation

## 2014-09-01 DIAGNOSIS — I252 Old myocardial infarction: Secondary | ICD-10-CM | POA: Diagnosis not present

## 2014-09-01 DIAGNOSIS — R079 Chest pain, unspecified: Secondary | ICD-10-CM | POA: Diagnosis present

## 2014-09-01 DIAGNOSIS — Z79899 Other long term (current) drug therapy: Secondary | ICD-10-CM | POA: Diagnosis not present

## 2014-09-01 DIAGNOSIS — I2510 Atherosclerotic heart disease of native coronary artery without angina pectoris: Secondary | ICD-10-CM | POA: Insufficient documentation

## 2014-09-01 DIAGNOSIS — R61 Generalized hyperhidrosis: Secondary | ICD-10-CM | POA: Diagnosis not present

## 2014-09-01 DIAGNOSIS — E785 Hyperlipidemia, unspecified: Secondary | ICD-10-CM | POA: Diagnosis not present

## 2014-09-01 DIAGNOSIS — R42 Dizziness and giddiness: Secondary | ICD-10-CM | POA: Insufficient documentation

## 2014-09-01 DIAGNOSIS — R6883 Chills (without fever): Secondary | ICD-10-CM | POA: Diagnosis not present

## 2014-09-01 LAB — COMPREHENSIVE METABOLIC PANEL
ALK PHOS: 90 U/L (ref 39–117)
ALT: 18 U/L (ref 0–35)
ANION GAP: 14 (ref 5–15)
AST: 17 U/L (ref 0–37)
Albumin: 3.3 g/dL — ABNORMAL LOW (ref 3.5–5.2)
BILIRUBIN TOTAL: 0.4 mg/dL (ref 0.3–1.2)
BUN: 23 mg/dL (ref 6–23)
CO2: 26 mEq/L (ref 19–32)
Calcium: 8.6 mg/dL (ref 8.4–10.5)
Chloride: 101 mEq/L (ref 96–112)
Creatinine, Ser: 1.1 mg/dL (ref 0.50–1.10)
GFR calc non Af Amer: 51 mL/min — ABNORMAL LOW (ref >90)
GFR, EST AFRICAN AMERICAN: 59 mL/min — AB (ref >90)
GLUCOSE: 103 mg/dL — AB (ref 70–99)
POTASSIUM: 3.5 meq/L — AB (ref 3.7–5.3)
SODIUM: 141 meq/L (ref 137–147)
TOTAL PROTEIN: 7.3 g/dL (ref 6.0–8.3)

## 2014-09-01 LAB — CBC
HCT: 38.7 % (ref 36.0–46.0)
Hemoglobin: 13.1 g/dL (ref 12.0–15.0)
MCH: 29 pg (ref 26.0–34.0)
MCHC: 33.9 g/dL (ref 30.0–36.0)
MCV: 85.6 fL (ref 78.0–100.0)
Platelets: 239 10*3/uL (ref 150–400)
RBC: 4.52 MIL/uL (ref 3.87–5.11)
RDW: 14.3 % (ref 11.5–15.5)
WBC: 9 10*3/uL (ref 4.0–10.5)

## 2014-09-01 LAB — PROTIME-INR
INR: 0.9 (ref 0.00–1.49)
PROTHROMBIN TIME: 12.2 s (ref 11.6–15.2)

## 2014-09-01 LAB — PRO B NATRIURETIC PEPTIDE: Pro B Natriuretic peptide (BNP): 98.1 pg/mL (ref 0–125)

## 2014-09-01 LAB — TROPONIN I: Troponin I: <0.3 ng/mL (ref <0.30)

## 2014-09-01 LAB — LIPASE, BLOOD: LIPASE: 17 U/L (ref 11–59)

## 2014-09-01 LAB — MAGNESIUM: Magnesium: 2 mg/dL (ref 1.5–2.5)

## 2014-09-01 MED ORDER — SENNOSIDES-DOCUSATE SODIUM 8.6-50 MG PO TABS: 2.0000 | ORAL_TABLET | Freq: Every day | ORAL | Status: DC

## 2014-09-01 MED ORDER — ASPIRIN 81 MG PO CHEW: 324.0000 mg | CHEWABLE_TABLET | Freq: Once | ORAL | Status: DC

## 2014-09-01 MED ORDER — GI COCKTAIL ~~LOC~~
30.0000 mL | Freq: Once | ORAL | Status: AC
  Administered 2014-09-01: 30 mL via ORAL
  Filled 2014-09-01: qty 30

## 2014-09-01 MED ORDER — SUCRALFATE 1 G PO TABS: 1.0000 g | ORAL_TABLET | Freq: Four times a day (QID) | ORAL | Status: DC

## 2014-09-01 NOTE — ED Notes (Signed)
CP, dizziness x this am

## 2014-09-01 NOTE — ED Provider Notes (Signed)
CSN: [id number]     Arrival date & time 09/01/14  1810 History  This chart was scribed for Taylor Muskrat, MD by Jeanell Sparrow, ED Scribe. This patient was seen in room MH12/MH12 and the patient's care was started at 6:32 PM.     Chief Complaint  Patient presents with  . Chest Pain   The history is provided by the patient. No language interpreter was used.   HPI Comments: [name] is a 68 y.o. female with a hx of MI, HTN, and hypercholesterolemia who presents to the Emergency Department complaining of constant moderate epigastric chest pain that started this morning. She states that she felt fatigued last night without pain. She states that the pain started this morning with some associated dizziness, nausea, diaphoresis, and chills. She reports that the pain is non radiating. She describes the pain as a heavy sensation. She reports no modifying factors for the pain. She states that she takes plavix regularly. She denies any emesis, diarrhea, SOB, or fever.   Past Medical History  Diagnosis Date  . GERD (gastroesophageal reflux disease)     occasional  . Hyperlipidemia   . Hypertension     Echocardiogram 11/19/11: Difficult acoustic windows, EF 60-65%, normal LV wall thickness, grade 1 diastolic dysfunction  . Vertigo   . Tubular adenoma of colon 06/2011  . Colitis   . Multinodular thyroid     Goiter s/p thyroidectomy in 2007 with post-op  hypocalcemia and post-op hypothyroidism/hypoparathyroidism with hypocalcemia  . CAD (coronary artery disease)     a) NSTEMI 10/2011: LHC 11/19/11: pLAD 30%, oOM 60%, AVCFX 30%, CFX after OM2 30%, pRCA 60 and 70%, then 99%, AM 80-90% with TIMI 3 flow.  PCI: Promus DES x 2 to RCA.  Marland Kitchen Post-surgical hypothyroidism   . Hypoparathyroidism   . Palpitations     a) 03/2012 - patient set up for event monitor but did not wear correctly -  she declined wearing a repeat monitor  . Unspecified constipation 05/31/2013  . Back pain 05/31/2013  . Nocturia 05/31/2013   . Neck pain on right side 07/13/2013  . MI (myocardial infarction)    Past Surgical History  Procedure Laterality Date  . Colonoscopy  Aenomatous polyps    07/05/2011  . Total thyroidectomy  2007    GOITER  . Shoulder arthroscopy w/ rotator cuff repair      right  . Coronary angioplasty with stent placement     Family History  Problem Relation Age of Onset  . Colon cancer Brother   . Cancer Brother     COLON  . Hypertension Father   . Diabetes Neg Hx   . Prostate cancer Neg Hx   . Heart disease Father   . Breast cancer Neg Hx    History  Substance Use Topics  . Smoking status: Never Smoker   . Smokeless tobacco: Never Used  . Alcohol Use: No   OB History   Grav Para Term Preterm Abortions TAB SAB Ect Mult Living   '4 4 4       4     '$ Review of Systems  Constitutional: Positive for chills and diaphoresis. Negative for fever.       Per HPI, otherwise negative  HENT:       Per HPI, otherwise negative  Respiratory: Negative for shortness of breath.        Per HPI, otherwise negative  Cardiovascular: Positive for chest pain.       Per HPI,  otherwise negative  Gastrointestinal: Positive for nausea. Negative for vomiting and diarrhea.  Endocrine:       Negative aside from HPI  Genitourinary:       Neg aside from HPI   Musculoskeletal:       Per HPI, otherwise negative  Skin: Negative.   Neurological: Positive for dizziness. Negative for syncope.    Allergies  Review of patient's allergies indicates no known allergies.  Home Medications   Prior to Admission medications   Medication Sig Start Date End Date Taking? Authorizing Provider  amLODipine (NORVASC) 10 MG tablet Take 1 tablet (10 mg total) by mouth daily. 06/28/14   Mosie Lukes, MD  aspirin EC 81 MG tablet Take 81 mg by mouth daily.   11/22/11   Dayna N Dunn, PA-C  calcitRIOL (ROCALTROL) 0.25 MCG capsule Take 3 capsules (0.75 mcg total) by mouth daily. 08/03/13   Elayne Snare, MD  calcium carbonate (TUMS -  DOSED IN MG ELEMENTAL CALCIUM) 500 MG chewable tablet Chew 4 tablets by mouth 2 (two) times daily.     Historical Provider, MD  clopidogrel (PLAVIX) 75 MG tablet Take 1 tablet (75 mg total) by mouth once. 06/28/14   Mosie Lukes, MD  CYANOCOBALAMIN PO Take 1 tablet by mouth daily.    Historical Provider, MD  famotidine (PEPCID) 20 MG tablet Take 1 tablet by mouth two  times daily 04/29/14   Fay Records, MD  isosorbide mononitrate (IMDUR) 60 MG 24 hr tablet Take 1 tablet by mouth  daily. 04/29/14   Fay Records, MD  levothyroxine (SYNTHROID, LEVOTHROID) 112 MCG tablet Take 1 tablet (112 mcg total) by mouth daily. 06/28/14   Mosie Lukes, MD  MOVIPREP 100 G SOLR Take 1 kit (200 g total) by mouth once. 07/12/14   Ladene Artist, MD  NITROSTAT 0.4 MG SL tablet Dissolve 1 tablet under the tongue every 5 minutes as   needed for chest pain as   directed 04/29/14   Fay Records, MD  rosuvastatin (CRESTOR) 20 MG tablet Take 1 tablet (20 mg total) by mouth daily. 06/28/14   Mosie Lukes, MD  traMADol (ULTRAM) 50 MG tablet Take 1 tablet (50 mg total) by mouth at bedtime as needed for pain. 07/13/13   Mosie Lukes, MD  vitamin D, CHOLECALCIFEROL, 400 UNITS tablet Take 1 tablet (400 Units total) by mouth 2 (two) times daily. 11/22/11   Dayna N Dunn, PA-C   BP 156/74  Pulse 89  Temp(Src) 98.9 F (37.2 C) (Oral)  Resp 18  Ht $R'5\' 5"'ez$  (1.651 m)  Wt 190 lb (86.183 kg)  BMI 31.62 kg/m2  SpO2 98% Physical Exam  Nursing note and vitals reviewed. Constitutional: She is oriented to person, place, and time. She appears well-developed and well-nourished. No distress.  HENT:  Head: Normocephalic and atraumatic.  Eyes: Conjunctivae and EOM are normal.  Neck: Neck supple. No tracheal deviation present.  Cardiovascular: Normal rate and regular rhythm.   Pulmonary/Chest: Effort normal and breath sounds normal. No stridor. No respiratory distress.  Abdominal: She exhibits no distension.  Musculoskeletal: Normal range of  motion. She exhibits no edema.  Neurological: She is alert and oriented to person, place, and time. No cranial nerve deficit.  Skin: Skin is warm and dry.  Psychiatric: She has a normal mood and affect. Her behavior is normal.    ED Course  Procedures (including critical care time)  COORDINATION OF CARE: 6:36 PM- Pt advised of plan  for treatment which includes medication, radiology, and labs and pt agrees.  Labs Review Labs Reviewed  COMPREHENSIVE METABOLIC PANEL - Abnormal; Notable for the following:    Potassium 3.5 (*)    Glucose, Bld 103 (*)    Albumin 3.3 (*)    GFR calc non Af Amer 51 (*)    GFR calc Af Amer 59 (*)    All other components within normal limits  CBC  PRO B NATRIURETIC PEPTIDE  MAGNESIUM  PROTIME-INR  TROPONIN I  LIPASE, BLOOD    Imaging Review Dg Chest 2 View  09/01/2014   CLINICAL DATA:  Chest pain  EXAM: CHEST  2 VIEW  COMPARISON:  August 09, 2013  FINDINGS: There is subsegmental atelectasis in the left base. Elsewhere lungs are clear. Heart size and pulmonary vascularity are normal. No adenopathy. There are surgical clips in the lower neck region. There is calcification in the right carotid artery.  IMPRESSION: Mild left base atelectasis. No edema or consolidation. Right carotid artery calcification present.   Electronically Signed   By: Lowella Grip M.D.   On: 09/01/2014 19:22     EKG Interpretation   Date/Time:  Wednesday September 01 2014 18:23:15 EDT Ventricular Rate:  84 PR Interval:  166 QRS Duration: 82 QT Interval:  388 QTC Calculation: 458 R Axis:   22 Text Interpretation:  Normal sinus rhythm Low voltage QRS Borderline ECG  Sinus rhythm Low voltage QRS No significant change since last tracing  Borderline ECG Confirmed by Taylor Muskrat  MD ([id number]) on 09/01/2014  6:27:54 PM     9:07 PM Patient awake, alert, and in no distress. She answers questions appropriately, denies ongoing complaints.  MDM   I personally performed the  services described in this documentation, which was scribed in my presence. The recorded information has been reviewed and is accurate.   This patient presents with ongoing epigastric pain.  Patient has no superior chest pain, no dyspnea, lightheadedness, syncope, or any other ongoing complaints. Patient is a history of coronary disease, though she states she is compliant with her Plavix for at today's evaluation is largely reassuring, with low suspicion for ongoing ischemia. Given the resolution of symptoms with GI cocktail, patient's pain is likely gastroesophageal in origin.    Taylor Muskrat, MD 09/01/14 2107

## 2014-09-01 NOTE — Discharge Instructions (Signed)
As discussed, your evaluation today has been largely reassuring.  But, it is important that you monitor your condition carefully, and do not hesitate to return to the ED if you develop new, or concerning changes in your condition.  Your pain is likely due to irritation of your stomach and/or esophagus.  It is very important that you take all medication as directed, and be sure to take your Prevacid twice daily.   Otherwise, please follow-up with your physician for appropriate ongoing care.

## 2014-09-01 NOTE — ED Notes (Signed)
Pt states she had 3 81 mg ASA at home this am. MD aware. Will hold asa for now.

## 2014-09-06 ENCOUNTER — Ambulatory Visit: Payer: Medicare Other | Admitting: Endocrinology

## 2014-09-07 ENCOUNTER — Ambulatory Visit: Payer: Self-pay | Admitting: Physician Assistant

## 2014-09-10 ENCOUNTER — Ambulatory Visit (INDEPENDENT_AMBULATORY_CARE_PROVIDER_SITE_OTHER): Payer: Medicare Other | Admitting: Physician Assistant

## 2014-09-10 ENCOUNTER — Encounter: Payer: Self-pay | Admitting: Physician Assistant

## 2014-09-10 VITALS — BP 125/74 | HR 78 | Temp 98.2°F | Resp 16 | Ht 64.0 in | Wt 190.4 lb

## 2014-09-10 DIAGNOSIS — K219 Gastro-esophageal reflux disease without esophagitis: Secondary | ICD-10-CM

## 2014-09-10 DIAGNOSIS — R5383 Other fatigue: Secondary | ICD-10-CM

## 2014-09-10 DIAGNOSIS — R5381 Other malaise: Secondary | ICD-10-CM

## 2014-09-10 DIAGNOSIS — E785 Hyperlipidemia, unspecified: Secondary | ICD-10-CM

## 2014-09-10 LAB — COMPREHENSIVE METABOLIC PANEL
ALT: 25 U/L (ref 0–35)
AST: 23 U/L (ref 0–37)
Albumin: 4 g/dL (ref 3.5–5.2)
Alkaline Phosphatase: 85 U/L (ref 39–117)
BUN: 24 mg/dL — AB (ref 6–23)
CALCIUM: 7.9 mg/dL — AB (ref 8.4–10.5)
CHLORIDE: 105 meq/L (ref 96–112)
CO2: 25 mEq/L (ref 19–32)
Creatinine, Ser: 1.3 mg/dL — ABNORMAL HIGH (ref 0.4–1.2)
GFR: 44.13 mL/min — AB (ref >60.00)
GLUCOSE: 102 mg/dL — AB (ref 70–99)
Potassium: 3.6 mEq/L (ref 3.5–5.1)
Sodium: 141 mEq/L (ref 135–145)
TOTAL PROTEIN: 7.9 g/dL (ref 6.0–8.3)
Total Bilirubin: 0.4 mg/dL (ref 0.2–1.2)

## 2014-09-10 LAB — LIPID PANEL
CHOL/HDL RATIO: 5
Cholesterol: 171 mg/dL (ref 0–200)
HDL: 31.8 mg/dL — AB (ref >39.00)
LDL Cholesterol: 105 mg/dL — ABNORMAL HIGH (ref 0–99)
NonHDL: 139.2
Triglycerides: 169 mg/dL — ABNORMAL HIGH (ref 0.0–149.0)
VLDL: 33.8 mg/dL (ref 0.0–40.0)

## 2014-09-10 LAB — CBC WITH DIFFERENTIAL/PLATELET
BASOS PCT: 0.5 % (ref 0.0–3.0)
Basophils Absolute: 0 10*3/uL (ref 0.0–0.1)
EOS PCT: 4.1 % (ref 0.0–5.0)
Eosinophils Absolute: 0.3 10*3/uL (ref 0.0–0.7)
HCT: 39.8 % (ref 36.0–46.0)
Hemoglobin: 13.3 g/dL (ref 12.0–15.0)
Lymphocytes Relative: 24.9 % (ref 12.0–46.0)
Lymphs Abs: 2 10*3/uL (ref 0.7–4.0)
MCHC: 33.4 g/dL (ref 30.0–36.0)
MCV: 85.8 fl (ref 78.0–100.0)
Monocytes Absolute: 0.6 10*3/uL (ref 0.1–1.0)
Monocytes Relative: 7.1 % (ref 3.0–12.0)
Neutro Abs: 5 10*3/uL (ref 1.4–7.7)
Neutrophils Relative %: 63.4 % (ref 43.0–77.0)
Platelets: 301 10*3/uL (ref 150.0–400.0)
RBC: 4.63 Mil/uL (ref 3.87–5.11)
RDW: 14.6 % (ref 11.5–15.5)
WBC: 7.9 10*3/uL (ref 4.0–10.5)

## 2014-09-10 LAB — T4, FREE: Free T4: 1.36 ng/dL (ref 0.60–1.60)

## 2014-09-10 LAB — H. PYLORI ANTIBODY, IGG: H PYLORI IGG: NEGATIVE

## 2014-09-10 LAB — TSH: TSH: 0.89 u[IU]/mL (ref 0.35–4.50)

## 2014-09-10 LAB — CALCIUM: Calcium: 7.9 mg/dL — ABNORMAL LOW (ref 8.4–10.5)

## 2014-09-10 NOTE — Patient Instructions (Signed)
Please obtain labs.  I will call you with your results.  Stay well hydrated and eat a well-balanced diet.  I am glad you are feeling better.

## 2014-09-10 NOTE — Assessment & Plan Note (Signed)
Epigastric pain resolved.  Continue current regimen.  Will check H. Pylori.

## 2014-09-10 NOTE — Progress Notes (Signed)
Pre visit review using our clinic review tool, if applicable. No additional management support is needed unless otherwise documented below in the visit note/SLS  

## 2014-09-10 NOTE — Assessment & Plan Note (Signed)
Will obtain lab workup to include CBC, CMP, TSH, T4, Calcium and H. Pylori giving recent issues and concerns of fatigue.  Continue medications as directed.  Increase physical activity.

## 2014-09-10 NOTE — Progress Notes (Signed)
Patient presents to clinic today for ER follow-up. Patient seen in ER on 09/01/14 for complaints of heartburn and epigastric tenderness.  Workup including CXR and EKG within normal limits.  Symptoms resolved after administration of GI cocktail.  Patient endorses complete resolution of symptoms.  Denies N/V/D, heart burn or epigastric pain.  Denies melena, hematochezia or tenesmus. Does wish to discuss intermittent fatigue over the past several months.  Patient endorses good fluid intake and well-balanced diet.  Does have history of hypothyroidism.  Endorses taking Synthroid as directed. Has history of mild anemia.  Has upcoming colonoscopy.  Past Medical History  Diagnosis Date  . GERD (gastroesophageal reflux disease)     occasional  . Hyperlipidemia   . Hypertension     Echocardiogram 11/19/11: Difficult acoustic windows, EF 60-65%, normal LV wall thickness, grade 1 diastolic dysfunction  . Vertigo   . Tubular adenoma of colon 06/2011  . Colitis   . Multinodular thyroid     Goiter s/p thyroidectomy in 2007 with post-op  hypocalcemia and post-op hypothyroidism/hypoparathyroidism with hypocalcemia  . CAD (coronary artery disease)     a) NSTEMI 10/2011: LHC 11/19/11: pLAD 30%, oOM 60%, AVCFX 30%, CFX after OM2 30%, pRCA 60 and 70%, then 99%, AM 80-90% with TIMI 3 flow.  PCI: Promus DES x 2 to RCA.  Marland Kitchen Post-surgical hypothyroidism   . Hypoparathyroidism   . Palpitations     a) 03/2012 - patient set up for event monitor but did not wear correctly -  she declined wearing a repeat monitor  . Unspecified constipation 05/31/2013  . Back pain 05/31/2013  . Nocturia 05/31/2013  . Neck pain on right side 07/13/2013  . MI (myocardial infarction)     Current Outpatient Prescriptions on File Prior to Visit  Medication Sig Dispense Refill  . amLODipine (NORVASC) 10 MG tablet Take 1 tablet (10 mg total) by mouth daily.  90 tablet  2  . aspirin EC 81 MG tablet Take 81 mg by mouth daily.        . calcitRIOL  (ROCALTROL) 0.25 MCG capsule Take 3 capsules (0.75 mcg total) by mouth daily.  360 capsule  3  . calcium carbonate (TUMS - DOSED IN MG ELEMENTAL CALCIUM) 500 MG chewable tablet Chew 4 tablets by mouth 2 (two) times daily.       . clopidogrel (PLAVIX) 75 MG tablet Take 1 tablet (75 mg total) by mouth once.  90 tablet  2  . CYANOCOBALAMIN PO Take 1 tablet by mouth daily.      . famotidine (PEPCID) 20 MG tablet Take 1 tablet by mouth two  times daily  60 tablet  6  . isosorbide mononitrate (IMDUR) 60 MG 24 hr tablet Take 1 tablet by mouth  daily.  30 tablet  5  . levothyroxine (SYNTHROID, LEVOTHROID) 112 MCG tablet Take 1 tablet (112 mcg total) by mouth daily.  90 tablet  2  . MOVIPREP 100 G SOLR Take 1 kit (200 g total) by mouth once.  1 kit  0  . NITROSTAT 0.4 MG SL tablet Dissolve 1 tablet under the tongue every 5 minutes as   needed for chest pain as   directed  25 tablet  2  . rosuvastatin (CRESTOR) 20 MG tablet Take 1 tablet (20 mg total) by mouth daily.  90 tablet  2  . senna-docusate (SENOKOT-S) 8.6-50 MG per tablet Take 2 tablets by mouth daily.  20 tablet  0  . traMADol (ULTRAM) 50 MG tablet  Take 1 tablet (50 mg total) by mouth at bedtime as needed for pain.  30 tablet  1  . vitamin D, CHOLECALCIFEROL, 400 UNITS tablet Take 1 tablet (400 Units total) by mouth 2 (two) times daily.  60 tablet  1   No current facility-administered medications on file prior to visit.    No Known Allergies  Family History  Problem Relation Age of Onset  . Colon cancer Brother   . Cancer Brother     COLON  . Hypertension Father   . Diabetes Neg Hx   . Prostate cancer Neg Hx   . Heart disease Father   . Breast cancer Neg Hx     History   Social History  . Marital Status: Single    Spouse Name: N/A    Number of Children: N/A  . Years of Education: N/A   Social History Main Topics  . Smoking status: Never Smoker   . Smokeless tobacco: Never Used  . Alcohol Use: No  . Drug Use: No  . Sexual  Activity: None   Other Topics Concern  . None   Social History Narrative  . None   Review of Systems - See HPI.  All other ROS are negative.  BP 125/74  Pulse 78  Temp(Src) 98.2 F (36.8 C) (Oral)  Resp 16  Ht _0  (1.626 m)  Wt 190 lb 6 oz (86.354 kg)  BMI 32.66 kg/m2  SpO2 97%  Physical Exam  Vitals reviewed. Constitutional: She is oriented to person, place, and time and well-developed, well-nourished, and in no distress.  HENT:  Head: Normocephalic and atraumatic.  Right Ear: External ear normal.  Left Ear: External ear normal.  Nose: Nose normal.  Mouth/Throat: Oropharynx is clear and moist.  Eyes: Conjunctivae are normal. Pupils are equal, round, and reactive to light.  Neck: Neck supple. No thyromegaly present.  Cardiovascular: Normal rate, regular rhythm, normal heart sounds and intact distal pulses.   Pulmonary/Chest: Effort normal and breath sounds normal. No respiratory distress. She has no wheezes. She has no rales. She exhibits no tenderness.  Abdominal: Soft. Bowel sounds are normal. She exhibits no distension and no mass. There is no tenderness. There is no rebound and no guarding.  Lymphadenopathy:    She has no cervical adenopathy.  Neurological: She is alert and oriented to person, place, and time.  Skin: Skin is warm and dry. No rash noted.  Psychiatric: Affect normal.    Recent Results (from the past 2160 hour(s))  CBC     Status: None   Collection Time    09/01/14  6:44 PM      Result Value Ref Range   WBC 9.0  4.0 - 10.5 K/uL   RBC 4.52  3.87 - 5.11 MIL/uL   Hemoglobin 13.1  12.0 - 15.0 g/dL   HCT 38.7  36.0 - 46.0 %   MCV 85.6  78.0 - 100.0 fL   MCH 29.0  26.0 - 34.0 pg   MCHC 33.9  30.0 - 36.0 g/dL   RDW 14.3  11.5 - 15.5 %   Platelets 239  150 - 400 K/uL  COMPREHENSIVE METABOLIC PANEL     Status: Abnormal   Collection Time    09/01/14  6:44 PM      Result Value Ref Range   Sodium 141  137 - 147 mEq/L   Potassium 3.5 (*) 3.7 - 5.3  mEq/L   Chloride 101  96 - 112 mEq/L   CO2  26  19 - 32 mEq/L   Glucose, Bld 103 (*) 70 - 99 mg/dL   BUN 23  6 - 23 mg/dL   Creatinine, Ser 1.10  0.50 - 1.10 mg/dL   Calcium 8.6  8.4 - 10.5 mg/dL   Total Protein 7.3  6.0 - 8.3 g/dL   Albumin 3.3 (*) 3.5 - 5.2 g/dL   AST 17  0 - 37 U/L   ALT 18  0 - 35 U/L   Alkaline Phosphatase 90  39 - 117 U/L   Total Bilirubin 0.4  0.3 - 1.2 mg/dL   GFR calc non Af Amer 51 (*) >90 mL/min   GFR calc Af Amer 59 (*) >90 mL/min   Comment: (NOTE)     The eGFR has been calculated using the CKD EPI equation.     This calculation has not been validated in all clinical situations.     eGFR's persistently <90 mL/min signify possible Chronic Kidney     Disease.   Anion gap 14  5 - 15  PRO B NATRIURETIC PEPTIDE     Status: None   Collection Time    09/01/14  6:44 PM      Result Value Ref Range   Pro B Natriuretic peptide (BNP) 98.1  0 - 125 pg/mL  MAGNESIUM     Status: None   Collection Time    09/01/14  6:44 PM      Result Value Ref Range   Magnesium 2.0  1.5 - 2.5 mg/dL  PROTIME-INR     Status: None   Collection Time    09/01/14  6:44 PM      Result Value Ref Range   Prothrombin Time 12.2  11.6 - 15.2 seconds   INR 0.90  0.00 - 1.49  TROPONIN I     Status: None   Collection Time    09/01/14  6:44 PM      Result Value Ref Range   Troponin I <0.30  <0.30 ng/mL   Comment:            Due to the release kinetics of cTnI,     a negative result within the first hours     of the onset of symptoms does not rule out     myocardial infarction with certainty.     If myocardial infarction is still suspected,     repeat the test at appropriate intervals.  LIPASE, BLOOD     Status: None   Collection Time    09/01/14  6:44 PM      Result Value Ref Range   Lipase 17  11 - 59 U/L    Assessment/Plan: Other fatigue Will obtain lab workup to include CBC, CMP, TSH, T4, Calcium and H. Pylori giving recent issues and concerns of fatigue.  Continue  medications as directed.  Increase physical activity.  GERD (gastroesophageal reflux disease) Epigastric pain resolved.  Continue current regimen.  Will check H. Pylori.

## 2014-09-13 ENCOUNTER — Ambulatory Visit: Payer: Medicare Other | Admitting: Endocrinology

## 2014-09-14 ENCOUNTER — Telehealth: Payer: Self-pay | Admitting: Family Medicine

## 2014-09-14 DIAGNOSIS — R5383 Other fatigue: Secondary | ICD-10-CM

## 2014-09-14 NOTE — Telephone Encounter (Signed)
Patient wants a call with the results of her lab work.

## 2014-09-17 NOTE — Telephone Encounter (Signed)
Left a very detailed message and stated pt needs to call back to schedule lab appt.   Order put in

## 2014-09-17 NOTE — Telephone Encounter (Signed)
Caller name: Kendel  Relation to pt: self  Call back number: [phone]   Reason for call: pt inquiring about lab results.

## 2014-09-21 ENCOUNTER — Other Ambulatory Visit (INDEPENDENT_AMBULATORY_CARE_PROVIDER_SITE_OTHER): Payer: Medicare Other

## 2014-09-21 DIAGNOSIS — R5383 Other fatigue: Secondary | ICD-10-CM

## 2014-09-21 DIAGNOSIS — R5381 Other malaise: Secondary | ICD-10-CM

## 2014-09-21 LAB — VITAMIN D 25 HYDROXY (VIT D DEFICIENCY, FRACTURES): VITD: 78.19 ng/mL (ref 30.00–100.00)

## 2014-09-22 ENCOUNTER — Telehealth: Payer: Self-pay | Admitting: Gastroenterology

## 2014-09-22 ENCOUNTER — Other Ambulatory Visit: Payer: Self-pay | Admitting: *Deleted

## 2014-09-22 DIAGNOSIS — Z86010 Personal history of colonic polyps: Secondary | ICD-10-CM

## 2014-09-22 MED ORDER — MOVIPREP 100 G PO SOLR: 1.0000 | Freq: Once | ORAL | Status: DC

## 2014-09-22 NOTE — Telephone Encounter (Signed)
Patient was notified to hold Plavix five days prior to procedure Patient verbalized understanding. ________________________________________________________________________________________________________________________________________________________________________________________________ Carlyle Dolly, CMA at 07/16/2014  2:30 PM      Status: Signed            PATIENT NOTIFIED OKAY TO HOLD PLAVIX FIVE DAYS PRIOR TO PROCEDURE. PATIENT VERBALIZED UNDERSTANDING.         Fay Records, MD at 07/16/2014  2:26 PM      Status: Signed            Note sent to [name]  OK to stop prior         Carlyle Dolly, CMA at 07/16/2014  2:22 PM      Status: Signed            07/16/2014     RE:      [name] DOB:   09-24-1946 MRN:   [id number]     Dear Dr. Harrington Challenger,      We have scheduled the above patient for an Colonoscopy. Our records show that she is on anticoagulation therapy.    Please advise as to how long the patient may come off her therapy of Plavix prior to the procedure, which is scheduled for 08-09-2014.   Please fax back/ or route the completed form to Evette Georges., CMA   Sincerely,       Hope Pigeon

## 2014-09-24 LAB — PTH, INTACT AND CALCIUM
Calcium: 8.4 mg/dL (ref 8.4–10.5)
PTH: 5 pg/mL — ABNORMAL LOW (ref 14–64)

## 2014-09-28 ENCOUNTER — Encounter (HOSPITAL_COMMUNITY)
Admission: RE | Disposition: A | Discharge status: Home / Self Care | Payer: Medicare Other | Source: Ambulatory Visit | Attending: Gastroenterology

## 2014-09-28 ENCOUNTER — Encounter (HOSPITAL_COMMUNITY): Payer: Self-pay | Admitting: *Deleted

## 2014-09-28 ENCOUNTER — Telehealth: Payer: Self-pay | Admitting: Family Medicine

## 2014-09-28 ENCOUNTER — Ambulatory Visit (HOSPITAL_COMMUNITY)
Admission: RE | Admit: 2014-09-28 | Discharge: 2014-09-28 | Disposition: A | Discharge status: Home / Self Care | Payer: Medicare Other | Source: Ambulatory Visit | Attending: Gastroenterology | Admitting: Gastroenterology

## 2014-09-28 DIAGNOSIS — Z7982 Long term (current) use of aspirin: Secondary | ICD-10-CM | POA: Insufficient documentation

## 2014-09-28 DIAGNOSIS — Z1211 Encounter for screening for malignant neoplasm of colon: Secondary | ICD-10-CM

## 2014-09-28 DIAGNOSIS — E785 Hyperlipidemia, unspecified: Secondary | ICD-10-CM | POA: Insufficient documentation

## 2014-09-28 DIAGNOSIS — K529 Noninfective gastroenteritis and colitis, unspecified: Secondary | ICD-10-CM | POA: Diagnosis not present

## 2014-09-28 DIAGNOSIS — I10 Essential (primary) hypertension: Secondary | ICD-10-CM | POA: Diagnosis not present

## 2014-09-28 DIAGNOSIS — E038 Other specified hypothyroidism: Secondary | ICD-10-CM

## 2014-09-28 DIAGNOSIS — K644 Residual hemorrhoidal skin tags: Secondary | ICD-10-CM | POA: Diagnosis not present

## 2014-09-28 DIAGNOSIS — D124 Benign neoplasm of descending colon: Secondary | ICD-10-CM | POA: Diagnosis not present

## 2014-09-28 DIAGNOSIS — Z79899 Other long term (current) drug therapy: Secondary | ICD-10-CM | POA: Insufficient documentation

## 2014-09-28 DIAGNOSIS — K219 Gastro-esophageal reflux disease without esophagitis: Secondary | ICD-10-CM | POA: Insufficient documentation

## 2014-09-28 DIAGNOSIS — I252 Old myocardial infarction: Secondary | ICD-10-CM | POA: Diagnosis not present

## 2014-09-28 DIAGNOSIS — Z860100 Personal history of colon polyps, unspecified: Secondary | ICD-10-CM

## 2014-09-28 DIAGNOSIS — I2510 Atherosclerotic heart disease of native coronary artery without angina pectoris: Secondary | ICD-10-CM | POA: Insufficient documentation

## 2014-09-28 DIAGNOSIS — E890 Postprocedural hypothyroidism: Secondary | ICD-10-CM | POA: Diagnosis not present

## 2014-09-28 HISTORY — PX: COLONOSCOPY: SHX5424

## 2014-09-28 SURGERY — COLONOSCOPY
Anesthesia: Moderate Sedation

## 2014-09-28 MED ORDER — MIDAZOLAM HCL 5 MG/5ML IJ SOLN
INTRAMUSCULAR | Status: DC | PRN
  Administered 2014-09-28 (×3): 2.5 mg via INTRAVENOUS

## 2014-09-28 MED ORDER — FENTANYL CITRATE 0.05 MG/ML IJ SOLN
INTRAMUSCULAR | Status: DC | PRN
  Administered 2014-09-28 (×3): 25 ug via INTRAVENOUS

## 2014-09-28 MED ORDER — SODIUM CHLORIDE 0.9 % IV SOLN
INTRAVENOUS | Status: DC
  Administered 2014-09-28: 500 mL via INTRAVENOUS

## 2014-09-28 MED ORDER — DIPHENHYDRAMINE HCL 50 MG/ML IJ SOLN
INTRAMUSCULAR | Status: AC
  Filled 2014-09-28: qty 1

## 2014-09-28 MED ORDER — FENTANYL CITRATE 0.05 MG/ML IJ SOLN
INTRAMUSCULAR | Status: AC
  Filled 2014-09-28: qty 4

## 2014-09-28 MED ORDER — MIDAZOLAM HCL 10 MG/2ML IJ SOLN
INTRAMUSCULAR | Status: AC
  Filled 2014-09-28: qty 4

## 2014-09-28 NOTE — Telephone Encounter (Signed)
Left a detailed message   pth ordered

## 2014-09-28 NOTE — Discharge Instructions (Signed)
Colonoscopy, Care After °These instructions give you information on caring for yourself after your procedure. Your doctor may also give you more specific instructions. Call your doctor if you have any problems or questions after your procedure. °HOME CARE °· Do not drive for 24 hours. °· Do not sign important papers or use machinery for 24 hours. °· You may shower. °· You may go back to your usual activities, but go slower for the first 24 hours. °· Take rest breaks often during the first 24 hours. °· Walk around or use warm packs on your belly (abdomen) if you have belly cramping or gas. °· Drink enough fluids to keep your pee (urine) clear or pale yellow. °· Resume your normal diet. Avoid heavy or fried foods. °· Avoid drinking alcohol for 24 hours or as told by your doctor. °· Only take medicines as told by your doctor. °If a tissue sample (biopsy) was taken during the procedure:  °· Do not take aspirin or blood thinners for 7 days, or as told by your doctor. °· Do not drink alcohol for 7 days, or as told by your doctor. °· Eat soft foods for the first 24 hours. °GET HELP IF: °You still have a small amount of blood in your poop (stool) 2-3 days after the procedure. °GET HELP RIGHT AWAY IF: °· You have more than a small amount of blood in your poop. °· You see clumps of tissue (blood clots) in your poop. °· Your belly is puffy (swollen). °· You feel sick to your stomach (nauseous) or throw up (vomit). °· You have a fever. °· You have belly pain that gets worse and medicine does not help. °MAKE SURE YOU: °· Understand these instructions. °· Will watch your condition. °· Will get help right away if you are not doing well or get worse. °Document Released: 01/12/2011 Document Revised: 12/15/2013 Document Reviewed: 08/17/2013 °ExitCare® Patient Information ©2015 [employer]. This information is not intended to replace advice given to you by your health care provider. Make sure you discuss any questions you have with  your health care provider. ° °

## 2014-09-28 NOTE — Interval H&P Note (Signed)
History and Physical Interval Note:  09/28/2014 8:39 AM  [name]  has presented today for surgery, with the diagnosis of Screening for colon cancer [V76.51]  The various methods of treatment have been discussed with the patient and family. After consideration of risks, benefits and other options for treatment, the patient has consented to  Procedure(s): COLONOSCOPY (N/A) as a surgical intervention .  The patient's history has been reviewed, patient examined, no change in status, stable for surgery.  I have reviewed the patient's chart and labs.  Questions were answered to the patient's satisfaction.     Pricilla Riffle. Fuller Plan MD

## 2014-09-28 NOTE — Op Note (Signed)
[facility] [place], [address]   COLONOSCOPY PROCEDURE REPORT  PATIENT: [name], [name]  MR#: [id number] BIRTHDATE: [birth year] , 68  yrs. old GENDER: female ENDOSCOPIST: Ladene Artist, MD, [facility]  PROCEDURE DATE:  09/28/2014 PROCEDURE:   Colonoscopy with snare polypectomy First Screening Colonoscopy - Avg.  risk and is 50 yrs.  old or older - No.  Prior Negative Screening - Now for repeat screening. N/A  History of Adenoma - Now for follow-up colonoscopy & has been > or = to 3 yrs.  Yes hx of adenoma.  Has been 3 or more years since last colonoscopy.  Polyps Removed Today? Yes.  ASA CLASS:   Class III INDICATIONS:surveillance colonoscopy based on a history of adenomatous colonic polyp(s). MEDICATIONS: Fentanyl 75 mcg IV and Versed 7.5 mg IV  DESCRIPTION OF PROCEDURE:   After the risks benefits and alternatives of the procedure were thoroughly explained, informed consent was obtained.  The digital rectal exam revealed no abnormalities of the rectum.   The Pentax Ped Colon [id number] endoscope was introduced through the anus and advanced to the cecum, which was identified by both the appendix and ileocecal valve. No adverse events experienced.   The quality of the prep was excellent, using MoviPrep  The instrument was then slowly withdrawn as the colon was fully examined. Due to techincal problems no images were captured.      COLON FINDINGS: A sessile polyp measuring 5 mm in size was found in the descending colon.  A polypectomy was performed with a cold snare.  The resection was complete, the polyp tissue was completely retrieved and sent to histology.   The examination was otherwise normal.  Retroflexed views revealed small external hemorrhoids. The time to cecum=5 minutes 00 seconds.  Withdrawal time=11 minutes 00 seconds.  The scope was withdrawn and the procedure completed.  COMPLICATIONS: There were no immediate  complications.  ENDOSCOPIC IMPRESSION: 1.   Sessile polyp in the descending colon; polypectomy was performed with a cold snare 2 .  Small external hemorrhoids  RECOMMENDATIONS: 1.  Await pathology results 2.  Repeat colonoscopy in 5 years  eSigned:  Ladene Artist, MD, [facility] 09/28/2014 9:28 AM

## 2014-09-28 NOTE — H&P (View-Only) (Signed)
Patient presents to clinic today for ER follow-up. Patient seen in ER on 09/01/14 for complaints of heartburn and epigastric tenderness.  Workup including CXR and EKG within normal limits.  Symptoms resolved after administration of GI cocktail.  Patient endorses complete resolution of symptoms.  Denies N/V/D, heart burn or epigastric pain.  Denies melena, hematochezia or tenesmus. Does wish to discuss intermittent fatigue over the past several months.  Patient endorses good fluid intake and well-balanced diet.  Does have history of hypothyroidism.  Endorses taking Synthroid as directed. Has history of mild anemia.  Has upcoming colonoscopy.  Past Medical History  Diagnosis Date  . GERD (gastroesophageal reflux disease)     occasional  . Hyperlipidemia   . Hypertension     Echocardiogram 11/19/11: Difficult acoustic windows, EF 60-65%, normal LV wall thickness, grade 1 diastolic dysfunction  . Vertigo   . Tubular adenoma of colon 06/2011  . Colitis   . Multinodular thyroid     Goiter s/p thyroidectomy in 2007 with post-op  hypocalcemia and post-op hypothyroidism/hypoparathyroidism with hypocalcemia  . CAD (coronary artery disease)     a) NSTEMI 10/2011: LHC 11/19/11: pLAD 30%, oOM 60%, AVCFX 30%, CFX after OM2 30%, pRCA 60 and 70%, then 99%, AM 80-90% with TIMI 3 flow.  PCI: Promus DES x 2 to RCA.  Marland Kitchen Post-surgical hypothyroidism   . Hypoparathyroidism   . Palpitations     a) 03/2012 - patient set up for event monitor but did not wear correctly -  she declined wearing a repeat monitor  . Unspecified constipation 05/31/2013  . Back pain 05/31/2013  . Nocturia 05/31/2013  . Neck pain on right side 07/13/2013  . MI (myocardial infarction)     Current Outpatient Prescriptions on File Prior to Visit  Medication Sig Dispense Refill  . amLODipine (NORVASC) 10 MG tablet Take 1 tablet (10 mg total) by mouth daily.  90 tablet  2  . aspirin EC 81 MG tablet Take 81 mg by mouth daily.        . calcitRIOL  (ROCALTROL) 0.25 MCG capsule Take 3 capsules (0.75 mcg total) by mouth daily.  360 capsule  3  . calcium carbonate (TUMS - DOSED IN MG ELEMENTAL CALCIUM) 500 MG chewable tablet Chew 4 tablets by mouth 2 (two) times daily.       . clopidogrel (PLAVIX) 75 MG tablet Take 1 tablet (75 mg total) by mouth once.  90 tablet  2  . CYANOCOBALAMIN PO Take 1 tablet by mouth daily.      . famotidine (PEPCID) 20 MG tablet Take 1 tablet by mouth two  times daily  60 tablet  6  . isosorbide mononitrate (IMDUR) 60 MG 24 hr tablet Take 1 tablet by mouth  daily.  30 tablet  5  . levothyroxine (SYNTHROID, LEVOTHROID) 112 MCG tablet Take 1 tablet (112 mcg total) by mouth daily.  90 tablet  2  . MOVIPREP 100 G SOLR Take 1 kit (200 g total) by mouth once.  1 kit  0  . NITROSTAT 0.4 MG SL tablet Dissolve 1 tablet under the tongue every 5 minutes as   needed for chest pain as   directed  25 tablet  2  . rosuvastatin (CRESTOR) 20 MG tablet Take 1 tablet (20 mg total) by mouth daily.  90 tablet  2  . senna-docusate (SENOKOT-S) 8.6-50 MG per tablet Take 2 tablets by mouth daily.  20 tablet  0  . traMADol (ULTRAM) 50 MG tablet  Take 1 tablet (50 mg total) by mouth at bedtime as needed for pain.  30 tablet  1  . vitamin D, CHOLECALCIFEROL, 400 UNITS tablet Take 1 tablet (400 Units total) by mouth 2 (two) times daily.  60 tablet  1   No current facility-administered medications on file prior to visit.    No Known Allergies  Family History  Problem Relation Age of Onset  . Colon cancer Brother   . Cancer Brother     COLON  . Hypertension Father   . Diabetes Neg Hx   . Prostate cancer Neg Hx   . Heart disease Father   . Breast cancer Neg Hx     History   Social History  . Marital Status: Single    Spouse Name: N/A    Number of Children: N/A  . Years of Education: N/A   Social History Main Topics  . Smoking status: Never Smoker   . Smokeless tobacco: Never Used  . Alcohol Use: No  . Drug Use: No  . Sexual  Activity: None   Other Topics Concern  . None   Social History Narrative  . None   Review of Systems - See HPI.  All other ROS are negative.  BP 125/74  Pulse 78  Temp(Src) 98.2 F (36.8 C) (Oral)  Resp 16  Ht 5' 4" (1.626 m)  Wt 190 lb 6 oz (86.354 kg)  BMI 32.66 kg/m2  SpO2 97%  Physical Exam  Vitals reviewed. Constitutional: She is oriented to person, place, and time and well-developed, well-nourished, and in no distress.  HENT:  Head: Normocephalic and atraumatic.  Right Ear: External ear normal.  Left Ear: External ear normal.  Nose: Nose normal.  Mouth/Throat: Oropharynx is clear and moist.  Eyes: Conjunctivae are normal. Pupils are equal, round, and reactive to light.  Neck: Neck supple. No thyromegaly present.  Cardiovascular: Normal rate, regular rhythm, normal heart sounds and intact distal pulses.   Pulmonary/Chest: Effort normal and breath sounds normal. No respiratory distress. She has no wheezes. She has no rales. She exhibits no tenderness.  Abdominal: Soft. Bowel sounds are normal. She exhibits no distension and no mass. There is no tenderness. There is no rebound and no guarding.  Lymphadenopathy:    She has no cervical adenopathy.  Neurological: She is alert and oriented to person, place, and time.  Skin: Skin is warm and dry. No rash noted.  Psychiatric: Affect normal.    Recent Results (from the past 2160 hour(s))  CBC     Status: None   Collection Time    09/01/14  6:44 PM      Result Value Ref Range   WBC 9.0  4.0 - 10.5 K/uL   RBC 4.52  3.87 - 5.11 MIL/uL   Hemoglobin 13.1  12.0 - 15.0 g/dL   HCT 38.7  36.0 - 46.0 %   MCV 85.6  78.0 - 100.0 fL   MCH 29.0  26.0 - 34.0 pg   MCHC 33.9  30.0 - 36.0 g/dL   RDW 14.3  11.5 - 15.5 %   Platelets 239  150 - 400 K/uL  COMPREHENSIVE METABOLIC PANEL     Status: Abnormal   Collection Time    09/01/14  6:44 PM      Result Value Ref Range   Sodium 141  137 - 147 mEq/L   Potassium 3.5 (*) 3.7 - 5.3  mEq/L   Chloride 101  96 - 112 mEq/L   CO2   26  19 - 32 mEq/L   Glucose, Bld 103 (*) 70 - 99 mg/dL   BUN 23  6 - 23 mg/dL   Creatinine, Ser 1.10  0.50 - 1.10 mg/dL   Calcium 8.6  8.4 - 10.5 mg/dL   Total Protein 7.3  6.0 - 8.3 g/dL   Albumin 3.3 (*) 3.5 - 5.2 g/dL   AST 17  0 - 37 U/L   ALT 18  0 - 35 U/L   Alkaline Phosphatase 90  39 - 117 U/L   Total Bilirubin 0.4  0.3 - 1.2 mg/dL   GFR calc non Af Amer 51 (*) >90 mL/min   GFR calc Af Amer 59 (*) >90 mL/min   Comment: (NOTE)     The eGFR has been calculated using the CKD EPI equation.     This calculation has not been validated in all clinical situations.     eGFR's persistently <90 mL/min signify possible Chronic Kidney     Disease.   Anion gap 14  5 - 15  PRO B NATRIURETIC PEPTIDE     Status: None   Collection Time    09/01/14  6:44 PM      Result Value Ref Range   Pro B Natriuretic peptide (BNP) 98.1  0 - 125 pg/mL  MAGNESIUM     Status: None   Collection Time    09/01/14  6:44 PM      Result Value Ref Range   Magnesium 2.0  1.5 - 2.5 mg/dL  PROTIME-INR     Status: None   Collection Time    09/01/14  6:44 PM      Result Value Ref Range   Prothrombin Time 12.2  11.6 - 15.2 seconds   INR 0.90  0.00 - 1.49  TROPONIN I     Status: None   Collection Time    09/01/14  6:44 PM      Result Value Ref Range   Troponin I <0.30  <0.30 ng/mL   Comment:            Due to the release kinetics of cTnI,     a negative result within the first hours     of the onset of symptoms does not rule out     myocardial infarction with certainty.     If myocardial infarction is still suspected,     repeat the test at appropriate intervals.  LIPASE, BLOOD     Status: None   Collection Time    09/01/14  6:44 PM      Result Value Ref Range   Lipase 17  11 - 59 U/L    Assessment/Plan: Other fatigue Will obtain lab workup to include CBC, CMP, TSH, T4, Calcium and H. Pylori giving recent issues and concerns of fatigue.  Continue  medications as directed.  Increase physical activity.  GERD (gastroesophageal reflux disease) Epigastric pain resolved.  Continue current regimen.  Will check H. Pylori.

## 2014-09-28 NOTE — Telephone Encounter (Signed)
Caller name: Abisai Relation to pt: self Call back number: [phone] Pharmacy:  Reason for call:   Patient requesting last lab results

## 2014-09-29 ENCOUNTER — Encounter (HOSPITAL_COMMUNITY): Payer: Self-pay | Admitting: Gastroenterology

## 2014-09-29 ENCOUNTER — Encounter: Payer: Self-pay | Admitting: Gastroenterology

## 2014-09-30 ENCOUNTER — Other Ambulatory Visit: Payer: Self-pay

## 2014-10-01 ENCOUNTER — Other Ambulatory Visit (INDEPENDENT_AMBULATORY_CARE_PROVIDER_SITE_OTHER): Payer: Medicare Other

## 2014-10-01 DIAGNOSIS — E038 Other specified hypothyroidism: Secondary | ICD-10-CM

## 2014-10-04 ENCOUNTER — Telehealth: Payer: Self-pay | Admitting: Family Medicine

## 2014-10-04 LAB — PTH, INTACT AND CALCIUM
Calcium: 8.7 mg/dL (ref 8.4–10.5)
PTH: 4 pg/mL — AB (ref 14–64)

## 2014-10-04 NOTE — Telephone Encounter (Signed)
Caller name:Jisel Relation to pt: self Call back number: [phone] Pharmacy:  Reason for call:   Patient is requesting last lab results and is also wanting to know she is being charged for the injection that she received on 03/15/14.

## 2014-10-07 NOTE — Telephone Encounter (Signed)
Pt called again needs return phone call

## 2014-10-08 NOTE — Telephone Encounter (Signed)
Left a detailed message on patients vm. TDAP was given on 03-15-14  Dr Charlett Blake will you please advise the labs?

## 2014-10-08 NOTE — Telephone Encounter (Signed)
Nothing was typed/tmj 

## 2014-10-08 NOTE — Telephone Encounter (Signed)
Labs were repeat calcium and parathyroid and they were good.

## 2014-10-11 NOTE — Telephone Encounter (Signed)
Patient informed and voiced understanding

## 2014-10-25 ENCOUNTER — Encounter (HOSPITAL_COMMUNITY): Payer: Self-pay | Admitting: Gastroenterology

## 2014-10-28 ENCOUNTER — Other Ambulatory Visit: Payer: Self-pay | Admitting: Internal Medicine

## 2014-10-28 MED ORDER — ISOSORBIDE MONONITRATE ER 60 MG PO TB24: ORAL_TABLET | ORAL | Status: DC

## 2014-11-15 ENCOUNTER — Other Ambulatory Visit: Payer: Self-pay | Admitting: Endocrinology

## 2014-11-16 ENCOUNTER — Telehealth: Payer: Self-pay | Admitting: Internal Medicine

## 2014-11-16 DIAGNOSIS — E892 Postprocedural hypoparathyroidism: Secondary | ICD-10-CM

## 2014-11-16 NOTE — Telephone Encounter (Signed)
F/u  Patient needs a medication, Talcitriol, sent to [facility] for two weeks and then sent to [facility] for three months. Please contact patient [phone].

## 2014-11-16 NOTE — Telephone Encounter (Signed)
New Message        Pt calling stating her pharmacy has faxed [place] multiple times to get pt a refill on her meds. Pt states they haven't heard anything from our office and pt needs it to be sent before the holidays because she is completely out. Please call pt and advise.

## 2014-11-16 NOTE — Telephone Encounter (Signed)
Patient would like her cacitrol refilled by Dr. Harrington Challenger.  It was last filled by Dr. Dwyane Dee. I recommended she discuss with PCP office, as this is not a cardiac medication. I did advise her I will forward to Dr. Harrington Challenger to ask her if she can refill this medication. She became angry, states that [facility] has been requesting this medication and we have not responded, then told me to do what I have to do and hung up.

## 2014-11-17 ENCOUNTER — Other Ambulatory Visit: Payer: Self-pay

## 2014-11-17 ENCOUNTER — Telehealth: Payer: Self-pay | Admitting: Internal Medicine

## 2014-11-17 ENCOUNTER — Telehealth: Payer: Self-pay | Admitting: Family Medicine

## 2014-11-17 DIAGNOSIS — E892 Postprocedural hypoparathyroidism: Secondary | ICD-10-CM

## 2014-11-17 MED ORDER — CALCITRIOL 0.25 MCG PO CAPS: 0.7500 ug | ORAL_CAPSULE | Freq: Every day | ORAL | Status: DC

## 2014-11-17 MED ORDER — CALCITRIOL 0.25 MCG PO CAPS: ORAL_CAPSULE | ORAL | Status: DC

## 2014-11-17 NOTE — Telephone Encounter (Signed)
New Msg  Patient states she just received a call and is returning the call. Please contact at [phone]

## 2014-11-17 NOTE — Telephone Encounter (Signed)
Pt returned call and advised that Rx of Calcitriol  was sent to [facility] and [facility].  Advised for further refills needs to contact her PCP.  She verbalizes understanding.

## 2014-11-17 NOTE — Telephone Encounter (Signed)
Would refill immediate at [facility] and then [facility] 3 mont  Refill x 1  She can discuss with primary when sees

## 2014-11-17 NOTE — Telephone Encounter (Signed)
Lm for pt to call. Sent 2 wks supply of Calcitriol  to [facility] with no refills and 3 mo supply to [facility] with 1 refill

## 2014-11-17 NOTE — Telephone Encounter (Signed)
New Rx sent to [place] Patient has appt 11/29/2014  Patient notified of Rx sent

## 2014-11-17 NOTE — Telephone Encounter (Signed)
Caller name: Marcellina Relation to pt: self Call back number: [phone] Pharmacy:  Reason for call:   Patient states that she is out of calcitRIOL (ROCALTROL) 0.25 MCG capsule and would like refill sent to [facility] on [address]

## 2014-11-17 NOTE — Telephone Encounter (Signed)
Had left her a message this AM but have talked to her since that time.  She was returning that call made earlier today.

## 2014-11-29 ENCOUNTER — Encounter: Payer: Self-pay | Admitting: Family Medicine

## 2014-11-29 ENCOUNTER — Ambulatory Visit (INDEPENDENT_AMBULATORY_CARE_PROVIDER_SITE_OTHER): Payer: Medicare Other | Admitting: Family Medicine

## 2014-11-29 VITALS — BP 108/51 | HR 61 | Temp 97.7°F | Ht 64.0 in | Wt 191.8 lb

## 2014-11-29 DIAGNOSIS — E785 Hyperlipidemia, unspecified: Secondary | ICD-10-CM

## 2014-11-29 DIAGNOSIS — E892 Postprocedural hypoparathyroidism: Secondary | ICD-10-CM

## 2014-11-29 DIAGNOSIS — L578 Other skin changes due to chronic exposure to nonionizing radiation: Secondary | ICD-10-CM

## 2014-11-29 DIAGNOSIS — R001 Bradycardia, unspecified: Secondary | ICD-10-CM

## 2014-11-29 DIAGNOSIS — I10 Essential (primary) hypertension: Secondary | ICD-10-CM

## 2014-11-29 DIAGNOSIS — K219 Gastro-esophageal reflux disease without esophagitis: Secondary | ICD-10-CM

## 2014-11-29 MED ORDER — CALCITRIOL 0.25 MCG PO CAPS: ORAL_CAPSULE | ORAL | Status: DC

## 2014-11-29 NOTE — Patient Instructions (Signed)

## 2014-11-29 NOTE — Progress Notes (Signed)
Pre visit review using our clinic review tool, if applicable. No additional management support is needed unless otherwise documented below in the visit note. 

## 2014-12-01 ENCOUNTER — Encounter (HOSPITAL_COMMUNITY): Payer: Self-pay | Admitting: Cardiology

## 2014-12-02 ENCOUNTER — Emergency Department (HOSPITAL_COMMUNITY)
Admission: EM | Admit: 2014-12-02 | Discharge: 2014-12-02 | Disposition: A | Discharge status: Home / Self Care | Payer: Medicare Other | Attending: Emergency Medicine | Admitting: Emergency Medicine

## 2014-12-02 ENCOUNTER — Encounter (HOSPITAL_COMMUNITY): Payer: Self-pay | Admitting: Cardiology

## 2014-12-02 ENCOUNTER — Emergency Department (HOSPITAL_COMMUNITY): Payer: Medicare Other

## 2014-12-02 DIAGNOSIS — Z7982 Long term (current) use of aspirin: Secondary | ICD-10-CM | POA: Diagnosis not present

## 2014-12-02 DIAGNOSIS — I208 Other forms of angina pectoris: Secondary | ICD-10-CM | POA: Diagnosis present

## 2014-12-02 DIAGNOSIS — I252 Old myocardial infarction: Secondary | ICD-10-CM | POA: Diagnosis not present

## 2014-12-02 DIAGNOSIS — R0602 Shortness of breath: Secondary | ICD-10-CM

## 2014-12-02 DIAGNOSIS — I2510 Atherosclerotic heart disease of native coronary artery without angina pectoris: Secondary | ICD-10-CM | POA: Diagnosis not present

## 2014-12-02 DIAGNOSIS — Z79899 Other long term (current) drug therapy: Secondary | ICD-10-CM | POA: Insufficient documentation

## 2014-12-02 DIAGNOSIS — E209 Hypoparathyroidism, unspecified: Secondary | ICD-10-CM | POA: Insufficient documentation

## 2014-12-02 DIAGNOSIS — Z872 Personal history of diseases of the skin and subcutaneous tissue: Secondary | ICD-10-CM | POA: Insufficient documentation

## 2014-12-02 DIAGNOSIS — Z86010 Personal history of colonic polyps: Secondary | ICD-10-CM | POA: Diagnosis not present

## 2014-12-02 DIAGNOSIS — R42 Dizziness and giddiness: Secondary | ICD-10-CM | POA: Diagnosis present

## 2014-12-02 DIAGNOSIS — I10 Essential (primary) hypertension: Secondary | ICD-10-CM | POA: Insufficient documentation

## 2014-12-02 DIAGNOSIS — R112 Nausea with vomiting, unspecified: Secondary | ICD-10-CM | POA: Insufficient documentation

## 2014-12-02 DIAGNOSIS — R0789 Other chest pain: Secondary | ICD-10-CM

## 2014-12-02 DIAGNOSIS — Z87898 Personal history of other specified conditions: Secondary | ICD-10-CM

## 2014-12-02 DIAGNOSIS — K219 Gastro-esophageal reflux disease without esophagitis: Secondary | ICD-10-CM | POA: Diagnosis not present

## 2014-12-02 DIAGNOSIS — Z955 Presence of coronary angioplasty implant and graft: Secondary | ICD-10-CM | POA: Diagnosis not present

## 2014-12-02 DIAGNOSIS — I209 Angina pectoris, unspecified: Secondary | ICD-10-CM

## 2014-12-02 DIAGNOSIS — E039 Hypothyroidism, unspecified: Secondary | ICD-10-CM | POA: Diagnosis present

## 2014-12-02 DIAGNOSIS — E785 Hyperlipidemia, unspecified: Secondary | ICD-10-CM | POA: Insufficient documentation

## 2014-12-02 DIAGNOSIS — E042 Nontoxic multinodular goiter: Secondary | ICD-10-CM | POA: Diagnosis not present

## 2014-12-02 LAB — BASIC METABOLIC PANEL
ANION GAP: 18 — AB (ref 5–15)
BUN: 23 mg/dL (ref 6–23)
CALCIUM: 9 mg/dL (ref 8.4–10.5)
CO2: 24 mEq/L (ref 19–32)
CREATININE: 0.97 mg/dL (ref 0.50–1.10)
Chloride: 100 mEq/L (ref 96–112)
GFR calc Af Amer: 69 mL/min — ABNORMAL LOW (ref >90)
GFR, EST NON AFRICAN AMERICAN: 59 mL/min — AB (ref >90)
Glucose, Bld: 105 mg/dL — ABNORMAL HIGH (ref 70–99)
Potassium: 3.8 mEq/L (ref 3.7–5.3)
Sodium: 142 mEq/L (ref 137–147)

## 2014-12-02 LAB — CBC
HCT: 40.2 % (ref 36.0–46.0)
Hemoglobin: 13.4 g/dL (ref 12.0–15.0)
MCH: 29.1 pg (ref 26.0–34.0)
MCHC: 33.3 g/dL (ref 30.0–36.0)
MCV: 87.2 fL (ref 78.0–100.0)
PLATELETS: 226 10*3/uL (ref 150–400)
RBC: 4.61 MIL/uL (ref 3.87–5.11)
RDW: 14 % (ref 11.5–15.5)
WBC: 9.2 10*3/uL (ref 4.0–10.5)

## 2014-12-02 LAB — I-STAT TROPONIN, ED: TROPONIN I, POC: 0.01 ng/mL (ref 0.00–0.08)

## 2014-12-02 LAB — PROTIME-INR
INR: 0.94 (ref 0.00–1.49)
Prothrombin Time: 12.7 seconds (ref 11.6–15.2)

## 2014-12-02 LAB — TROPONIN I: Troponin I: <0.3 ng/mL (ref <0.30)

## 2014-12-02 MED ORDER — ASPIRIN EC 81 MG PO TBEC
243.0000 mg | DELAYED_RELEASE_TABLET | Freq: Once | ORAL | Status: AC
  Administered 2014-12-02: 243 mg via ORAL
  Filled 2014-12-02: qty 3

## 2014-12-02 NOTE — ED Notes (Addendum)
PA Creech at bedside for assessment.

## 2014-12-02 NOTE — ED Provider Notes (Signed)
Pt signed out at shift change by Al Corpus, PA-C.  Per Cardiology, pt may be discharged home pending a negative delta troponin.   Results for orders placed or performed during the hospital encounter of 12/02/14  CBC  Result Value Ref Range   WBC 9.2 4.0 - 10.5 K/uL   RBC 4.61 3.87 - 5.11 MIL/uL   Hemoglobin 13.4 12.0 - 15.0 g/dL   HCT 40.2 36.0 - 46.0 %   MCV 87.2 78.0 - 100.0 fL   MCH 29.1 26.0 - 34.0 pg   MCHC 33.3 30.0 - 36.0 g/dL   RDW 14.0 11.5 - 15.5 %   Platelets 226 150 - 400 K/uL  Basic metabolic panel  Result Value Ref Range   Sodium 142 137 - 147 mEq/L   Potassium 3.8 3.7 - 5.3 mEq/L   Chloride 100 96 - 112 mEq/L   CO2 24 19 - 32 mEq/L   Glucose, Bld 105 (H) 70 - 99 mg/dL   BUN 23 6 - 23 mg/dL   Creatinine, Ser 0.97 0.50 - 1.10 mg/dL   Calcium 9.0 8.4 - 10.5 mg/dL   GFR calc non Af Amer 59 (L) >90 mL/min   GFR calc Af Amer 69 (L) >90 mL/min   Anion gap 18 (H) 5 - 15  Protime-INR (if pt is taking Coumadin)  Result Value Ref Range   Prothrombin Time 12.7 11.6 - 15.2 seconds   INR 0.94 0.00 - 1.49  Troponin I  Result Value Ref Range   Troponin I <0.30 <0.30 ng/mL  I-stat troponin, ED (not at [facility])  Result Value Ref Range   Troponin i, poc 0.01 0.00 - 0.08 ng/mL   Comment 3           Dg Chest Portable 1 View  12/02/2014   CLINICAL DATA:  Short of breath  EXAM: PORTABLE CHEST - 1 VIEW  COMPARISON:  09/01/2014  FINDINGS: The heart size and mediastinal contours are within normal limits. Both lungs are clear. The visualized skeletal structures are unremarkable.  IMPRESSION: No active disease. Electronically Signed   By: Franchot Gallo M.D.   On: 12/02/2014 14:04    6:15 PM Delta troponin-negative.  Discussed results with pt. Pt denies chest pain at this time.   Pt states she feels comfortable being discharged home. Will f/u with PCP.   Return precautions provided. Pt verbalized understanding and agreement with tx plan.   Noland Fordyce, PA-C 12/02/14  [phone]  Noland Fordyce, PA-C 12/02/14 [place], MD 12/02/14 [phone]

## 2014-12-02 NOTE — Discharge Instructions (Signed)
Return to the emergency room with worsening of symptoms, new symptoms or with symptoms that are concerning, especially chest pain that feels like a pressure, spreads to left arm or jaw, worse with exertion, associated with nausea, vomiting, shortness of breath and/or sweating.  Call to make appointment with cardiology as soon as possible. Earlier than your scheduled date.    Chest Pain (Nonspecific) It is often hard to give a specific diagnosis for the cause of chest pain. There is always a chance that your pain could be related to something serious, such as a heart attack or a blood clot in the lungs. You need to follow up with your health care provider for further evaluation. CAUSES   Heartburn.  Pneumonia or bronchitis.  Anxiety or stress.  Inflammation around your heart (pericarditis) or lung (pleuritis or pleurisy).  A blood clot in the lung.  A collapsed lung (pneumothorax). It can develop suddenly on its own (spontaneous pneumothorax) or from trauma to the chest.  Shingles infection (herpes zoster virus). The chest wall is composed of bones, muscles, and cartilage. Any of these can be the source of the pain.  The bones can be bruised by injury.  The muscles or cartilage can be strained by coughing or overwork.  The cartilage can be affected by inflammation and become sore (costochondritis). DIAGNOSIS  Lab tests or other studies may be needed to find the cause of your pain. Your health care provider may have you take a test called an ambulatory electrocardiogram (ECG). An ECG records your heartbeat patterns over a 24-hour period. You may also have other tests, such as:  Transthoracic echocardiogram (TTE). During echocardiography, sound waves are used to evaluate how blood flows through your heart.  Transesophageal echocardiogram (TEE).  Cardiac monitoring. This allows your health care provider to monitor your heart rate and rhythm in real time.  Holter monitor. This is a  portable device that records your heartbeat and can help diagnose heart arrhythmias. It allows your health care provider to track your heart activity for several days, if needed.  Stress tests by exercise or by giving medicine that makes the heart beat faster. TREATMENT   Treatment depends on what may be causing your chest pain. Treatment may include:  Acid blockers for heartburn.  Anti-inflammatory medicine.  Pain medicine for inflammatory conditions.  Antibiotics if an infection is present.  You may be advised to change lifestyle habits. This includes stopping smoking and avoiding alcohol, caffeine, and chocolate.  You may be advised to keep your head raised (elevated) when sleeping. This reduces the chance of acid going backward from your stomach into your esophagus. Most of the time, nonspecific chest pain will improve within 2-3 days with rest and mild pain medicine.  HOME CARE INSTRUCTIONS   If antibiotics were prescribed, take them as directed. Finish them even if you start to feel better.  For the next few days, avoid physical activities that bring on chest pain. Continue physical activities as directed.  Do not use any tobacco products, including cigarettes, chewing tobacco, or electronic cigarettes.  Avoid drinking alcohol.  Only take medicine as directed by your health care provider.  Follow your health care provider's suggestions for further testing if your chest pain does not go away.  Keep any follow-up appointments you made. If you do not go to an appointment, you could develop lasting (chronic) problems with pain. If there is any problem keeping an appointment, call to reschedule. SEEK MEDICAL CARE IF:   Your chest  pain does not go away, even after treatment.  You have a rash with blisters on your chest.  You have a fever. SEEK IMMEDIATE MEDICAL CARE IF:   You have increased chest pain or pain that spreads to your arm, neck, jaw, back, or abdomen.  You  have shortness of breath.  You have an increasing cough, or you cough up blood.  You have severe back or abdominal pain.  You feel nauseous or vomit.  You have severe weakness.  You faint.  You have chills. This is an emergency. Do not wait to see if the pain will go away. Get medical help at once. Call your local emergency services (911 in [place] Do not drive yourself to the hospital. MAKE SURE YOU:   Understand these instructions.  Will watch your condition.  Will get help right away if you are not doing well or get worse. Document Released: 09/19/2005 Document Revised: 12/15/2013 Document Reviewed: 07/15/2008 [facility] Patient Information 2015 [place], [place]. This information is not intended to replace advice given to you by your health care provider. Make sure you discuss any questions you have with your health care provider.

## 2014-12-02 NOTE — ED Notes (Signed)
Pt reports nausea and dizziness that started about 2 hours ago while she was at work. States that she had the same symptoms 2 years ago when her had her MI.

## 2014-12-02 NOTE — H&P (Signed)
History and Physical   Patient ID: [name] MRN: [id number], DOB/AGE: [birth year] 68 y.o. Date of Encounter: 12/02/2014  Primary Physician: Penni Homans, MD Primary Cardiologist: Dr. Harrington Challenger  Chief Complaint:  Possible angina  HPI: [name] is a 68 y.o. female with a history of CAD, MI in 2013 with DES 2 to the RCA, HTN, HLD, GERD.   She had a non-STEMI in 2012 with 2 stents to the RCA. Her peak troponin was 2.65, her ECG showed diffuse mild ST elevation in the inferior and lateral leads, but did not meet STEMI criteria. One year later, she had a nuclear stress test that was abnormal with inferior infarct and peri-infarct ischemia. A relook catheterization showed patent stents and no new lesions. Medical therapy was recommended for a subtotal acute marginal, small vessel.  She has done well since then. She has not had chest pain despite working full-time in [employer], [employer]. With the holiday season, she has been on her feet a great deal, but has not had any exertional symptoms. Until today.  Today she was at work and had sudden onset of lower extremity weakness, dizziness and mild diaphoresis. She had some chest heaviness with this, at a 3 or 4/10. She took a sublingual nitroglycerin with improvement in her symptoms. However, her symptoms were very concerning to her and so she came to the emergency room. Her symptoms have completely resolved and she is resting comfortably.   Past Medical History  Diagnosis Date  . GERD (gastroesophageal reflux disease)     occasional  . Hyperlipidemia   . Hypertension     Echocardiogram 11/19/11: Difficult acoustic windows, EF 60-65%, normal LV wall thickness, grade 1 diastolic dysfunction  . Vertigo   . Tubular adenoma of colon 06/2011  . Colitis   . Multinodular thyroid     Goiter s/p thyroidectomy in 2007 with post-op  hypocalcemia and post-op hypothyroidism/hypoparathyroidism with hypocalcemia  . CAD (coronary artery disease)     a) NSTEMI 10/2011: LHC 11/19/11: pLAD 30%, oOM 60%, AVCFX 30%, CFX after OM2 30%, pRCA 60 and 70%, then 99%, AM 80-90% with TIMI 3 flow.  PCI: Promus DES x 2 to RCA.  Marland Kitchen Post-surgical hypothyroidism   . Hypoparathyroidism   . Palpitations     a) 03/2012 - patient set up for event monitor but did not wear correctly -  she declined wearing a repeat monitor  . Unspecified constipation 05/31/2013  . Back pain 05/31/2013  . Nocturia 05/31/2013  . Neck pain on right side 07/13/2013  . MI (myocardial infarction)     november 2013    Surgical History:  Past Surgical History  Procedure Laterality Date  . Colonoscopy  Aenomatous polyps    07/05/2011  . Total thyroidectomy  2007    GOITER  . Shoulder arthroscopy w/ rotator cuff repair      right  . Coronary angioplasty with stent placement    . Colonoscopy N/A 09/28/2014    Procedure: COLONOSCOPY;  Surgeon: Ladene Artist, MD;  Location: [facility];  Service: Endoscopy;  Laterality: N/A;  . Percutaneous coronary stent intervention (pci-s) N/A 11/19/2011    Procedure: PERCUTANEOUS CORONARY STENT INTERVENTION (PCI-S);  Surgeon: Hillary Bow, MD;  Location: [facility] CATH LAB;  Service: Cardiovascular;  Laterality: N/A;  . Left heart catheterization with coronary angiogram N/A 07/17/2012    Procedure: LEFT HEART CATHETERIZATION WITH CORONARY ANGIOGRAM;  Surgeon: Hillary Bow, MD;  Location: [facility] CATH LAB;  Service: Cardiovascular;  Laterality: N/A;     I have reviewed the patient's current medications. Prior to Admission medications   Medication Sig Start Date End Date Taking? Authorizing Provider  amLODipine (NORVASC) 10 MG tablet Take 1 tablet (10 mg total) by mouth daily. 06/28/14  Yes Mosie Lukes, MD  aspirin EC 81 MG tablet Take 81 mg by mouth daily.   11/22/11  Yes Dayna N Dunn, PA-C  calcitRIOL (ROCALTROL) 0.25 MCG capsule Take 3 cap daily Patient taking differently: Take 0.75 mcg by mouth daily. Take 3 cap daily 11/29/14  Yes Mosie Lukes, MD   calcium carbonate (TUMS - DOSED IN MG ELEMENTAL CALCIUM) 500 MG chewable tablet Chew 4 tablets by mouth 2 (two) times daily.    Yes Historical Provider, MD  clopidogrel (PLAVIX) 75 MG tablet Take 1 tablet (75 mg total) by mouth once. 06/28/14  Yes Mosie Lukes, MD  CYANOCOBALAMIN PO Take 1 tablet by mouth daily.   Yes Historical Provider, MD  famotidine (PEPCID) 20 MG tablet Take 1 tablet by mouth two  times daily 04/29/14  Yes Fay Records, MD  isosorbide mononitrate (IMDUR) 60 MG 24 hr tablet Take 1 tablet by mouth  daily. 10/28/14  Yes Fay Records, MD  levothyroxine (SYNTHROID, LEVOTHROID) 112 MCG tablet Take 1 tablet (112 mcg total) by mouth daily. 06/28/14  Yes Mosie Lukes, MD  NITROSTAT 0.4 MG SL tablet Dissolve 1 tablet under the tongue every 5 minutes as   needed for chest pain as   directed 04/29/14  Yes Fay Records, MD  rosuvastatin (CRESTOR) 20 MG tablet Take 1 tablet (20 mg total) by mouth daily. 06/28/14  Yes Mosie Lukes, MD  vitamin D, CHOLECALCIFEROL, 400 UNITS tablet Take 1 tablet (400 Units total) by mouth 2 (two) times daily. 11/22/11  Yes Dayna N Dunn, PA-C   Scheduled Meds: Continuous Infusions: PRN Meds:.    Allergies: No Known Allergies  History   Social History  . Marital Status: Married    Spouse Name: N/A    Number of Children: N/A  . Years of Education: N/A   Occupational History  . 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 Smoking status: Never Smoker   . Smokeless tobacco: Never Used  . Alcohol Use: No  . Drug Use: No  . Sexual Activity: Not on file   Other Topics Concern  . Not on file   Social History Narrative    Family History  Problem Relation Age of Onset  . Colon cancer Brother   . Cancer Brother     COLON  . Hypertension Father   . Diabetes Neg Hx   . Prostate cancer Neg Hx   . Heart disease Father   . Breast cancer Neg Hx    Family Status  Relation Status Death Age  . Brother Deceased   . Mother  Deceased   . Father Deceased     Review of Systems:   Full 14-point review of systems otherwise negative except as noted above.  Physical Exam: Blood pressure 146/58, pulse 65, temperature 98.3 F (36.8 C), temperature source Oral, resp. rate 15, height 5\' 4"  (1.626 m), weight 191 lb (86.637 kg), SpO2 97 %. General: Well developed, well nourished,female in no acute distress. Head: Normocephalic, atraumatic, sclera non-icteric, no xanthomas, nares are without discharge. Dentition: good Neck: No carotid bruits. JVD not elevated. No thyromegally Lungs: Good expansion bilaterally. without wheezes or rhonchi.  Heart: Regular rate and rhythm with S1 S2.  No S3 or S4.  No murmur, no rubs, or gallops appreciated. Abdomen: Soft, non-tender, non-distended with normoactive bowel sounds. No hepatomegaly. No rebound/guarding. No obvious abdominal masses. Msk:  Strength and tone appear normal for age. No joint deformities or effusions, no spine or costo-vertebral angle tenderness. Extremities: No clubbing or cyanosis. No edema.  Distal pedal pulses are 2+ in 4 extrem Neuro: Alert and oriented X 3. Moves all extremities spontaneously. No focal deficits noted. Psych:  Responds to questions appropriately with a normal affect. Skin: No rashes or lesions noted  Labs:   Lab Results  Component Value Date   WBC 9.2 12/02/2014   HGB 13.4 12/02/2014   HCT 40.2 12/02/2014   MCV 87.2 12/02/2014   PLT 226 12/02/2014    Recent Labs  12/02/14 1252  INR 0.94     Recent Labs Lab 12/02/14 1252  NA 142  K 3.8  CL 100  CO2 24  BUN 23  CREATININE 0.97  CALCIUM 9.0  GLUCOSE 105*    Recent Labs  12/02/14 1330  TROPIPOC 0.01   Lab Results  Component Value Date   CHOL 171 09/10/2014   HDL 31.80* 09/10/2014   LDLCALC 105* 09/10/2014   TRIG 169.0* 09/10/2014   PRO B NATRIURETIC PEPTIDE (BNP)  Date/Time Value Ref Range Status  09/01/2014 06:44 PM 98.1 0 - 125 pg/mL Final   Radiology/Studies:  Dg Chest Portable 1 View 12/02/2014   CLINICAL DATA:  Short of breath  EXAM: PORTABLE CHEST - 1 VIEW  COMPARISON:  09/01/2014  FINDINGS: The heart size and mediastinal contours are within normal limits. Both lungs are clear. The visualized skeletal structures are unremarkable.  IMPRESSION: No active disease.   Electronically Signed   By: Franchot Gallo M.D.   On: 12/02/2014 14:04   Cardiac Cath: 07/17/2012 Left mainstem: Calcified with mild ostial tapering.  Left anterior descending (LAD): 30% proximal and calcified plaque with significant narrowing. The distal vessel collateralizes the distal acute marginal from the RCA, which supplies the distal PDA territory Left circumflex (LCx): large caliber vessel. There is 40% ostial narrowing of the OM1 or ramus. The AV circ, which supplies three additional branches, is without narrowing.  Right coronary artery (RCA): This was previously stented. The overlapping mid stents are widely patent supplying a smaller PDA and moderate PLA system. There is slow flow in the second AM branch which is known to be a somewhat larger vessel with subtotal ostial narrowing. This exits mid stent. The vessel is collateralized.  Left ventriculography: Left ventricular systolic function is normal, LVEF is estimated at 55-65%, there is no significant mitral regurgitation  Final Conclusions:  1. Continued patency of the RCA stents to the distal RCA 2. No change in the LCA system with collateral flow to the distal AM territory (which supplies the distal PDA territory) 3. Subtotal ostial occlusion of the acute marginal of the RCA (See above) Recommendations:  1. Would favor medical therapy. If symptoms are not controlled, an attempt can be made at opening the acute marginal, but it would come likely at the expense of some deformity of the RCA stent.  Echo: 11/19/2011 Conclusions  Left ventricle: DIfficult acoustic windows limit study. Overall LVEF is normal at 60  to 65%. There are no definite wall motion abnormalities. The cavity size was normal. Wall thickness was normal. Doppler parameters are consistent with abnormal left ventricular relaxation (grade 1 diastolic dysfunction).    ECG: Sinus  rhythm, diffuse inferolateral ST elevation, less than or equal to 1 mm, no reciprocal changes. Does not meet STEMI criteria  ASSESSMENT AND PLAN:  Principal Problem:   Angina of effort - with a subtotally occluded acute marginal, she has a substrate for angina, but does not commonly have chest pain. She is compliant with medical therapy including aspirin, statin, Imdur 60 mg daily and Plavix. Previous P2 Y 12 test showed good platelet inhibition. She is currently pain-free.   Options include overnight admission, cycle cardiac enzymes, and consider ischemic evaluation with catheterization.   However, because of her known disease and the fact that her symptoms were relieved by sublingual nitroglycerin 1, cardiac enzymes could be rechecked and if her repeat enzymes are negative, the patient could be considered for discharge home with early outpatient follow-up and consideration given to cardiac catheterization if her symptoms are not controlled with medical therapy.  Active Problems:   Hypothyroidism - last TSH was in September 2015 and was normal, continue current Synthroid dose and follow-up with primary M.D.    Essential hypertension, benign - systolic blood pressure [phone] in the ER. She is compliant with medications. Would make no changes at this time, but follow this closely in the office.    GERD (gastroesophageal reflux disease) - she takes Pepcid twice a day and has no symptoms unless she eats particular foods.  Jonetta Speak, PA-C 12/02/2014 4:23 PM Beeper [phone]  Patient seen, examined. Available data reviewed. Agree with findings, assessment, and plan as outlined by Rosaria Ferries, PA-C. The patient was independently interviewed and  examined. Her husband is at the bedside. Exam reveals an alert, oriented woman in no distress. Lungs are clear, heart is regular rate and rhythm without murmur or gallop. Abdomen is soft and nontender, there is no peripheral edema. EKG reveals normal sinus rhythm with mild inferolateral ST segment elevation unchanged from previous tracings. Initial troponin is negative. Her history is consistent with a vasovagal event. She was up on her feet at work for at least 4 hours. She had minimal food intake this morning. She developed symptoms of lightheadedness, diaphoresis, and nausea. She took a nitroglycerin and nearly passed out. She did have mild chest discomfort but this occurred after all of her other symptoms. I do not think her symptoms are consistent with myocardial ischemia. However, with her history of known CAD, I would favor a second troponin here in the emergency department 4 hours after her initial blood draw. If the second troponin is negative, she can be discharged home from the emergency department. Will arrange follow-up with Dr. Harrington Challenger. Advised her to flush fluids and to eat small frequent meals. She will try to avoid prolonged standing if possible. She understands that if the symptoms recur, she immediately needs to sit down or lie down.  Sherren Mocha, M.D. 12/02/2014 4:56 PM

## 2014-12-02 NOTE — ED Provider Notes (Signed)
CSN: [id number]     Arrival date & time 12/02/14  1234 History   First MD Initiated Contact with Patient 12/02/14 1304     Chief Complaint  Patient presents with  . Dizziness  . Emesis     (Consider location/radiation/quality/duration/timing/severity/associated sxs/prior Treatment) HPI  [name] is a 68 y.o. female with PMH of vertigo, MI and 2013, hypertension, hyperlipidemia, CAD, GERD presenting with sudden onset of lightheadedness with nausea without emesis and diaphoresis that started while at work 2 hours ago. She stated she sat down and lightheadedness resolved. She also had some chest "heaviness". At that time she took one sublingual nitroglycerin which helped improve the pain. Patient also took her daily aspirin this morning. Patient denies worsening with exertion. She does state she had similar symptoms 2 years ago with her MI. Patient saw her cardiologist 3 months ago and is doing well. Patient denies headache, visual changes, slurred speech, numbness, tingling, weakness. Patient denies head injury or falls. No syncope. No fevers or chills. Patient with chronic back pain without acute worsening. Patient does take Plavix daily. No history DVT, PE, unilateral leg swelling or trauma or surgeries. No history of malignancy or immobility.   Past Medical History  Diagnosis Date  . GERD (gastroesophageal reflux disease)     occasional  . Hyperlipidemia   . Hypertension     Echocardiogram 11/19/11: Difficult acoustic windows, EF 60-65%, normal LV wall thickness, grade 1 diastolic dysfunction  . Vertigo   . Tubular adenoma of colon 06/2011  . Colitis   . Multinodular thyroid     Goiter s/p thyroidectomy in 2007 with post-op  hypocalcemia and post-op hypothyroidism/hypoparathyroidism with hypocalcemia  . CAD (coronary artery disease)     a) NSTEMI 10/2011: LHC 11/19/11: pLAD 30%, oOM 60%, AVCFX 30%, CFX after OM2 30%, pRCA 60 and 70%, then 99%, AM 80-90% with TIMI 3 flow.  PCI:  Promus DES x 2 to RCA.  Marland Kitchen Post-surgical hypothyroidism   . Hypoparathyroidism   . Palpitations     a) 03/2012 - patient set up for event monitor but did not wear correctly -  she declined wearing a repeat monitor  . Unspecified constipation 05/31/2013  . Back pain 05/31/2013  . Nocturia 05/31/2013  . Neck pain on right side 07/13/2013  . MI (myocardial infarction)     november 2013   Past Surgical History  Procedure Laterality Date  . Colonoscopy  Aenomatous polyps    07/05/2011  . Total thyroidectomy  2007    GOITER  . Shoulder arthroscopy w/ rotator cuff repair      right  . Coronary angioplasty with stent placement    . Colonoscopy N/A 09/28/2014    Procedure: COLONOSCOPY;  Surgeon: Ladene Artist, MD;  Location: [facility];  Service: Endoscopy;  Laterality: N/A;  . Percutaneous coronary stent intervention (pci-s) N/A 11/19/2011    Procedure: PERCUTANEOUS CORONARY STENT INTERVENTION (PCI-S);  Surgeon: Hillary Bow, MD;  Location: [facility] CATH LAB;  Service: Cardiovascular;  Laterality: N/A;  . Left heart catheterization with coronary angiogram N/A 07/17/2012    Procedure: LEFT HEART CATHETERIZATION WITH CORONARY ANGIOGRAM;  Surgeon: Hillary Bow, MD;  Location: [facility] CATH LAB;  Service: Cardiovascular;  Laterality: N/A;   Family History  Problem Relation Age of Onset  . Colon cancer Brother   . Cancer Brother     COLON  . Hypertension Father   . Diabetes Neg Hx   . Prostate cancer Neg Hx   . Heart  disease Father   . Breast cancer Neg Hx    History  Substance Use Topics  . Smoking status: Never Smoker   . Smokeless tobacco: Never Used  . Alcohol Use: No   OB History    Gravida Para Term Preterm AB TAB SAB Ectopic Multiple Living   4 4 4       4      Review of Systems  Constitutional: Negative for fever and chills.  HENT: Negative for congestion and rhinorrhea.   Eyes: Negative for visual disturbance.  Respiratory: Negative for cough and shortness of breath.    Cardiovascular: Positive for chest pain.  Gastrointestinal: Positive for nausea. Negative for vomiting and diarrhea.  Musculoskeletal: Negative for back pain and gait problem.  Skin: Negative for rash.  Neurological: Positive for dizziness. Negative for weakness and headaches.      Allergies  Review of patient's allergies indicates no known allergies.  Home Medications   Prior to Admission medications   Medication Sig Start Date End Date Taking? Authorizing Provider  amLODipine (NORVASC) 10 MG tablet Take 1 tablet (10 mg total) by mouth daily. 06/28/14  Yes Mosie Lukes, MD  aspirin EC 81 MG tablet Take 81 mg by mouth daily.   11/22/11  Yes Dayna N Dunn, PA-C  calcitRIOL (ROCALTROL) 0.25 MCG capsule Take 3 cap daily Patient taking differently: Take 0.75 mcg by mouth daily. Take 3 cap daily 11/29/14  Yes Mosie Lukes, MD  calcium carbonate (TUMS - DOSED IN MG ELEMENTAL CALCIUM) 500 MG chewable tablet Chew 4 tablets by mouth 2 (two) times daily.    Yes Historical Provider, MD  clopidogrel (PLAVIX) 75 MG tablet Take 1 tablet (75 mg total) by mouth once. 06/28/14  Yes Mosie Lukes, MD  CYANOCOBALAMIN PO Take 1 tablet by mouth daily.   Yes Historical Provider, MD  famotidine (PEPCID) 20 MG tablet Take 1 tablet by mouth two  times daily 04/29/14  Yes Fay Records, MD  isosorbide mononitrate (IMDUR) 60 MG 24 hr tablet Take 1 tablet by mouth  daily. 10/28/14  Yes Fay Records, MD  levothyroxine (SYNTHROID, LEVOTHROID) 112 MCG tablet Take 1 tablet (112 mcg total) by mouth daily. 06/28/14  Yes Mosie Lukes, MD  NITROSTAT 0.4 MG SL tablet Dissolve 1 tablet under the tongue every 5 minutes as   needed for chest pain as   directed 04/29/14  Yes Fay Records, MD  rosuvastatin (CRESTOR) 20 MG tablet Take 1 tablet (20 mg total) by mouth daily. 06/28/14  Yes Mosie Lukes, MD  vitamin D, CHOLECALCIFEROL, 400 UNITS tablet Take 1 tablet (400 Units total) by mouth 2 (two) times daily. 11/22/11  Yes Dayna N  Dunn, PA-C   BP 132/75 mmHg  Pulse 72  Temp(Src) 98.3 F (36.8 C) (Oral)  Resp 18  Ht 5\' 4"  (1.626 m)  Wt 191 lb (86.637 kg)  BMI 32.77 kg/m2  SpO2 97% Physical Exam  Constitutional: She appears well-developed and well-nourished. No distress.  HENT:  Head: Normocephalic and atraumatic.  Mouth/Throat: Oropharynx is clear and moist.  Eyes: Conjunctivae and EOM are normal. Pupils are equal, round, and reactive to light. Right eye exhibits no discharge. Left eye exhibits no discharge.  Neck: Normal range of motion. Neck supple. No JVD present.  No nuchal rigidity  Cardiovascular: Normal rate and regular rhythm.   No leg swelling or tenderness. Negative Homan's sign.  Pulmonary/Chest: Effort normal and breath sounds normal. No respiratory distress. She has  no wheezes.  Abdominal: Soft. Bowel sounds are normal. She exhibits no distension. There is no tenderness.  Neurological: She is alert. No cranial nerve deficit. Coordination normal.  Speech is clear and goal oriented. Peripheral visual fields intact. Strength 5/5 in upper and lower extremities. Sensation intact. Intact rapid alternating movements, finger to nose, and heel to shin. Negative Romberg. No pronator drift. Normal gait.   Skin: Skin is warm and dry. She is not diaphoretic.  Nursing note and vitals reviewed.   ED Course  Procedures (including critical care time) Labs Review Labs Reviewed  BASIC METABOLIC PANEL - Abnormal; Notable for the following:    Glucose, Bld 105 (*)    GFR calc non Af Amer 59 (*)    GFR calc Af Amer 69 (*)    Anion gap 18 (*)    All other components within normal limits  CBC  PROTIME-INR  TROPONIN I  I-STAT TROPOININ, ED    Imaging Review Dg Chest Portable 1 View  12/02/2014   CLINICAL DATA:  Short of breath  EXAM: PORTABLE CHEST - 1 VIEW  COMPARISON:  09/01/2014  FINDINGS: The heart size and mediastinal contours are within normal limits. Both lungs are clear. The visualized skeletal  structures are unremarkable.  IMPRESSION: No active disease.   Electronically Signed   By: Franchot Gallo M.D.   On: 12/02/2014 14:04     EKG Interpretation   Date/Time:  Thursday December 02 2014 12:41:09 EST Ventricular Rate:  73 PR Interval:  158 QRS Duration: 92 QT Interval:  414 QTC Calculation: 456 R Axis:   33 Text Interpretation:  Normal sinus rhythm Normal ECG no significant change  from Sept 2015 Confirmed by GOLDSTON  MD, [place] ([id number]) on 12/02/2014  4:02:10 PM      MDM   Final diagnoses:  Lightheadedness  Chest heaviness  History of vertigo   Patient presenting with dizziness described as vertigo and lightheadedness as well as chest heaviness that is now resolved. Patient states her MI 2 years ago had very similar presentation. Low risk Well's score. VSS, no JVD or new murmur, RRR, breath sounds equal bilaterally, EKG without acute abnormalities, negative troponin, and negative CXR. Concern for cadiac etiology of chest pain. Cardiology has been consulted and will see patient in ED. Awaiting cardiology evaluation for dispo.   Pt signed out to Noland Fordyce at shift change awaiting cardiology dispo.  Discussed all results and patient verbalizes understanding and agrees with plan.  This is a shared patient. This patient was discussed with the physician, Dr. Regenia Skeeter who saw and evaluated the patient and agrees with the plan.   Pura Spice, PA-C 12/02/14 1703  Ephraim Hamburger, MD 12/03/14 [phone]

## 2014-12-05 ENCOUNTER — Encounter: Payer: Self-pay | Admitting: Family Medicine

## 2014-12-05 DIAGNOSIS — L578 Other skin changes due to chronic exposure to nonionizing radiation: Secondary | ICD-10-CM

## 2014-12-05 HISTORY — DX: Other skin changes due to chronic exposure to nonionizing radiation: L57.8

## 2014-12-05 NOTE — Assessment & Plan Note (Signed)
RRR today 

## 2014-12-05 NOTE — Progress Notes (Signed)
[name]  [id number] [birth year] 12/05/2014      Progress Note-Follow Up  Subjective  Chief Complaint  Chief Complaint  Patient presents with  . Medication Refill    HPI  Patient is a 68 y.o. female in today for routine medical care. Follow-up generally . Does have occasional chest discomfort described as squeezing. Denies CP/palp/SOB/HA/congestion/fevers/GI or GU c/o. Taking meds as prescribed  Past Medical History  Diagnosis Date  . GERD (gastroesophageal reflux disease)     occasional  . Hyperlipidemia   . Hypertension     Echocardiogram 11/19/11: Difficult acoustic windows, EF 60-65%, normal LV wall thickness, grade 1 diastolic dysfunction  . Vertigo   . Tubular adenoma of colon 06/2011  . Colitis   . Multinodular thyroid     Goiter s/p thyroidectomy in 2007 with post-op  hypocalcemia and post-op hypothyroidism/hypoparathyroidism with hypocalcemia  . CAD (coronary artery disease)     a) NSTEMI 10/2011: LHC 11/19/11: pLAD 30%, oOM 60%, AVCFX 30%, CFX after OM2 30%, pRCA 60 and 70%, then 99%, AM 80-90% with TIMI 3 flow.  PCI: Promus DES x 2 to RCA.  Marland Kitchen Post-surgical hypothyroidism   . Hypoparathyroidism   . Palpitations     a) 03/2012 - patient set up for event monitor but did not wear correctly -  she declined wearing a repeat monitor  . Unspecified constipation 05/31/2013  . Back pain 05/31/2013  . Nocturia 05/31/2013  . Neck pain on right side 07/13/2013  . MI (myocardial infarction)     november 2013  . Sun-damaged skin 12/05/2014    Past Surgical History  Procedure Laterality Date  . Colonoscopy  Aenomatous polyps    07/05/2011  . Total thyroidectomy  2007    GOITER  . Shoulder arthroscopy w/ rotator cuff repair      right  . Coronary angioplasty with stent placement    . Colonoscopy N/A 09/28/2014    Procedure: COLONOSCOPY;  Surgeon: Ladene Artist, MD;  Location: [facility] ENDOSCOPY;  Service: Endoscopy;  Laterality: N/A;  . Percutaneous coronary stent  intervention (pci-s) N/A 11/19/2011    Procedure: PERCUTANEOUS CORONARY STENT INTERVENTION (PCI-S);  Surgeon: Hillary Bow, MD;  Location: [facility] CATH LAB;  Service: Cardiovascular;  Laterality: N/A;  . Left heart catheterization with coronary angiogram N/A 07/17/2012    Procedure: LEFT HEART CATHETERIZATION WITH CORONARY ANGIOGRAM;  Surgeon: Hillary Bow, MD;  Location: [facility] CATH LAB;  Service: Cardiovascular;  Laterality: N/A;    Family History  Problem Relation Age of Onset  . Colon cancer Brother   . Cancer Brother     COLON  . Hypertension Father   . Diabetes Neg Hx   . Prostate cancer Neg Hx   . Heart disease Father   . Breast cancer Neg Hx     History   Social History  . Marital Status: Married    Spouse Name: N/A    Number of Children: N/A  . Years of Education: N/A   Occupational History  . 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 Smoking status: Never Smoker   . Smokeless tobacco: Never Used  . Alcohol Use: No  . Drug Use: No  . Sexual Activity: Not on file   Other Topics Concern  . Not on file   Social History Narrative    Current Outpatient Prescriptions on File Prior to Visit  Medication Sig Dispense Refill  . amLODipine (NORVASC) 10 MG tablet Take  1 tablet (10 mg total) by mouth daily. 90 tablet 2  . aspirin EC 81 MG tablet Take 81 mg by mouth daily.      . calcium carbonate (TUMS - DOSED IN MG ELEMENTAL CALCIUM) 500 MG chewable tablet Chew 4 tablets by mouth 2 (two) times daily.     . clopidogrel (PLAVIX) 75 MG tablet Take 1 tablet (75 mg total) by mouth once. 90 tablet 2  . CYANOCOBALAMIN PO Take 1 tablet by mouth daily.    . famotidine (PEPCID) 20 MG tablet Take 1 tablet by mouth two  times daily 60 tablet 6  . isosorbide mononitrate (IMDUR) 60 MG 24 hr tablet Take 1 tablet by mouth  daily. 30 tablet 5  . levothyroxine (SYNTHROID, LEVOTHROID) 112 MCG tablet Take 1 tablet (112 mcg total) by mouth daily. 90 tablet 2  .  NITROSTAT 0.4 MG SL tablet Dissolve 1 tablet under the tongue every 5 minutes as   needed for chest pain as   directed 25 tablet 2  . rosuvastatin (CRESTOR) 20 MG tablet Take 1 tablet (20 mg total) by mouth daily. 90 tablet 2  . vitamin D, CHOLECALCIFEROL, 400 UNITS tablet Take 1 tablet (400 Units total) by mouth 2 (two) times daily. 60 tablet 1   No current facility-administered medications on file prior to visit.    No Known Allergies  Review of Systems  Review of Systems  Constitutional: Negative for fever and malaise/fatigue.  HENT: Negative for congestion.   Eyes: Negative for discharge.  Respiratory: Negative for shortness of breath.   Cardiovascular: Positive for chest pain. Negative for palpitations and leg swelling.  Gastrointestinal: Negative for nausea, abdominal pain and diarrhea.  Genitourinary: Negative for dysuria.  Musculoskeletal: Negative for falls.  Skin: Negative for rash.  Neurological: Negative for loss of consciousness and headaches.  Endo/Heme/Allergies: Negative for polydipsia.  Psychiatric/Behavioral: Negative for depression and suicidal ideas. The patient is not nervous/anxious and does not have insomnia.     Objective  BP 108/51 mmHg  Pulse 61  Temp(Src) 97.7 F (36.5 C) (Oral)  Ht 5\' 4"  (1.626 m)  Wt 191 lb 12.8 oz (87 kg)  BMI 32.91 kg/m2  SpO2 97%  Physical Exam  Physical Exam  Constitutional: She is oriented to person, place, and time and well-developed, well-nourished, and in no distress. No distress.  HENT:  Head: Normocephalic and atraumatic.  Eyes: Conjunctivae are normal.  Neck: Neck supple. No thyromegaly present.  Cardiovascular: Normal rate, regular rhythm and normal heart sounds.   No murmur heard. Pulmonary/Chest: Effort normal and breath sounds normal. She has no wheezes.  Abdominal: She exhibits no distension and no mass.  Musculoskeletal: She exhibits no edema.  Lymphadenopathy:    She has no cervical adenopathy.    Neurological: She is alert and oriented to person, place, and time.  Skin: Skin is warm and dry. No rash noted. She is not diaphoretic.  Psychiatric: Memory, affect and judgment normal.    Lab Results  Component Value Date   TSH 0.89 09/10/2014   Lab Results  Component Value Date   WBC 9.2 12/02/2014   HGB 13.4 12/02/2014   HCT 40.2 12/02/2014   MCV 87.2 12/02/2014   PLT 226 12/02/2014   Lab Results  Component Value Date   CREATININE 0.97 12/02/2014   BUN 23 12/02/2014   NA 142 12/02/2014   K 3.8 12/02/2014   CL 100 12/02/2014   CO2 24 12/02/2014   Lab Results  Component Value Date  ALT 25 09/10/2014   AST 23 09/10/2014   ALKPHOS 85 09/10/2014   BILITOT 0.4 09/10/2014   Lab Results  Component Value Date   CHOL 171 09/10/2014   Lab Results  Component Value Date   HDL 31.80* 09/10/2014   Lab Results  Component Value Date   LDLCALC 105* 09/10/2014   Lab Results  Component Value Date   TRIG 169.0* 09/10/2014   Lab Results  Component Value Date   CHOLHDL 5 09/10/2014     Assessment & Plan  GERD (gastroesophageal reflux disease) Avoid offending foods, start probiotics. Do not eat large meals in late evening and consider raising head of bed.   Hyperlipidemia Tolerating statin, encouraged heart healthy diet, avoid trans fats, minimize simple carbs and saturated fats. Increase exercise as tolerated  Bradycardia RRR today.   Essential hypertension, benign Well controlled, no changes to meds. Encouraged heart healthy diet such as the DASH diet and exercise as tolerated.

## 2014-12-05 NOTE — Assessment & Plan Note (Signed)
Well controlled, no changes to meds. Encouraged heart healthy diet such as the DASH diet and exercise as tolerated.  °

## 2014-12-05 NOTE — Assessment & Plan Note (Signed)
Avoid offending foods, start probiotics. Do not eat large meals in late evening and consider raising head of bed.  

## 2014-12-05 NOTE — Assessment & Plan Note (Signed)
Tolerating statin, encouraged heart healthy diet, avoid trans fats, minimize simple carbs and saturated fats. Increase exercise as tolerated 

## 2014-12-06 ENCOUNTER — Encounter (HOSPITAL_COMMUNITY): Payer: Self-pay

## 2014-12-06 ENCOUNTER — Other Ambulatory Visit (HOSPITAL_COMMUNITY): Payer: Self-pay | Admitting: Cardiology

## 2014-12-06 DIAGNOSIS — I6523 Occlusion and stenosis of bilateral carotid arteries: Secondary | ICD-10-CM

## 2014-12-13 ENCOUNTER — Ambulatory Visit (HOSPITAL_COMMUNITY): Payer: Medicare Other | Attending: Cardiovascular Disease | Admitting: Cardiology

## 2014-12-13 DIAGNOSIS — I10 Essential (primary) hypertension: Secondary | ICD-10-CM | POA: Insufficient documentation

## 2014-12-13 DIAGNOSIS — I2510 Atherosclerotic heart disease of native coronary artery without angina pectoris: Secondary | ICD-10-CM | POA: Insufficient documentation

## 2014-12-13 DIAGNOSIS — I6523 Occlusion and stenosis of bilateral carotid arteries: Secondary | ICD-10-CM | POA: Insufficient documentation

## 2014-12-13 DIAGNOSIS — E785 Hyperlipidemia, unspecified: Secondary | ICD-10-CM | POA: Insufficient documentation

## 2014-12-13 DIAGNOSIS — E042 Nontoxic multinodular goiter: Secondary | ICD-10-CM | POA: Insufficient documentation

## 2014-12-13 NOTE — Progress Notes (Signed)
Carotid duplex performed 

## 2014-12-22 ENCOUNTER — Telehealth: Payer: Self-pay | Admitting: Internal Medicine

## 2014-12-22 NOTE — Telephone Encounter (Signed)
Patient informed. Noted on desktop also

## 2014-12-22 NOTE — Telephone Encounter (Signed)
New Msg          Pt states she was called today. Please call back if any concerns.

## 2014-12-27 ENCOUNTER — Ambulatory Visit: Payer: Self-pay | Admitting: Family Medicine

## 2015-01-03 ENCOUNTER — Other Ambulatory Visit: Payer: Self-pay

## 2015-01-03 ENCOUNTER — Telehealth: Payer: Self-pay | Admitting: Internal Medicine

## 2015-01-03 ENCOUNTER — Other Ambulatory Visit: Payer: Self-pay | Admitting: Family Medicine

## 2015-01-03 MED ORDER — ISOSORBIDE MONONITRATE ER 60 MG PO TB24: ORAL_TABLET | ORAL | Status: DC

## 2015-01-03 MED ORDER — AMLODIPINE BESYLATE 10 MG PO TABS: 10.0000 mg | ORAL_TABLET | Freq: Every day | ORAL | Status: DC

## 2015-01-03 MED ORDER — CLOPIDOGREL BISULFATE 75 MG PO TABS: 75.0000 mg | ORAL_TABLET | Freq: Once | ORAL | Status: DC

## 2015-01-03 MED ORDER — ROSUVASTATIN CALCIUM 20 MG PO TABS: 20.0000 mg | ORAL_TABLET | Freq: Every day | ORAL | Status: DC

## 2015-01-03 NOTE — Telephone Encounter (Signed)
New Msg       Pt requesting to have her prescription changed from 30 day supply to a 90 day supply.    Please call.

## 2015-01-03 NOTE — Telephone Encounter (Signed)
Caller name: Ajeenah Relation to pt: self Call back number: [phone] Pharmacy:  Reason for call:   Patient now using [place]. P: [phone]  Patient needs refills of all medications.

## 2015-01-03 NOTE — Telephone Encounter (Signed)
Will forward to refill department

## 2015-01-04 NOTE — Telephone Encounter (Signed)
Pt states that the rx should be faxed to [phone]

## 2015-01-05 NOTE — Telephone Encounter (Signed)
Left a message for call back.  A list of the medications need to be obtained.

## 2015-01-10 ENCOUNTER — Telehealth: Payer: Self-pay | Admitting: Family Medicine

## 2015-01-10 NOTE — Telephone Encounter (Signed)
Caller name: Essence Relation to pt: Call back number: [phone] Pharmacy: [facility] on [address]. [place]  Reason for call: Pt requesting rx for levothyroxine (SYNTHROID, LEVOTHROID) 112 MCG tablet. Please advise.

## 2015-01-12 MED ORDER — LEVOTHYROXINE SODIUM 112 MCG PO TABS: 112.0000 ug | ORAL_TABLET | Freq: Every day | ORAL | Status: DC

## 2015-01-12 NOTE — Telephone Encounter (Signed)
Levothyroxine refilled per protocol. JG//CMA 

## 2015-01-13 NOTE — Telephone Encounter (Signed)
LM for patient to call back. Need to see if she has her medication. Appears that Dr. Mare Ferrari filled her meds but they were sent to [facility].

## 2015-01-17 MED ORDER — LEVOTHYROXINE SODIUM 112 MCG PO TABS: 112.0000 ug | ORAL_TABLET | Freq: Every day | ORAL | Status: DC

## 2015-01-17 NOTE — Addendum Note (Signed)
Addended by: Murtis Sink A on: 01/17/2015 10:00 AM   Modules accepted: Orders

## 2015-01-17 NOTE — Telephone Encounter (Signed)
Done

## 2015-01-17 NOTE — Telephone Encounter (Signed)
This was sent to [facility]. Should have been sent to [facility] on [address]

## 2015-01-19 ENCOUNTER — Telehealth: Payer: Self-pay | Admitting: Internal Medicine

## 2015-01-19 NOTE — Telephone Encounter (Signed)
New message    Request for surgical clearance:  1. What type of surgery is being performed? Back injection   2. When is this surgery scheduled? 2.9.2016 @ 8;40 am    3. Are there any medications that need to be held prior to surgery and how long? plavix 5 days before   4. Name of physician performing surgery? Dr. Suella Broad   5. What is your office phone and fax number? [phone] /  ext [phone] . 6. fax [phone]

## 2015-01-19 NOTE — Telephone Encounter (Signed)
Left message for Deanne at [facility] orth[facility] to call back. Will ask Elberta Leatherwood, PharmD., about holding plavix after I know where specifically the injection will be.

## 2015-01-20 NOTE — Telephone Encounter (Signed)
Spoke with Cyril Mourning, pharmacist.  States usually hold asa and plavix for any spinal injection.

## 2015-01-20 NOTE — Telephone Encounter (Signed)
OK to hold  Resume after

## 2015-01-20 NOTE — Telephone Encounter (Signed)
Per Lenore Manner, [facility].  Patient is having 2 injections. A right spinal nerve root block (L5) and a cervical epidural steroid injection.  Patient takes asa 81 mg daily and plavix 75 mg daily.

## 2015-01-21 NOTE — Telephone Encounter (Signed)
Placed in nurse fax bin to be faxed.

## 2015-01-24 ENCOUNTER — Telehealth: Payer: Self-pay | Admitting: Internal Medicine

## 2015-01-24 NOTE — Telephone Encounter (Signed)
Received request from Nurse fax box:   To: [employer] Fax number: [phone] Attention: Deanne/Dr. Nelva Bush

## 2015-02-03 NOTE — Telephone Encounter (Signed)
Pt never returned call.//AB/CMA

## 2015-02-14 ENCOUNTER — Ambulatory Visit: Payer: Self-pay | Admitting: Family Medicine

## 2015-02-28 ENCOUNTER — Other Ambulatory Visit: Payer: Self-pay | Admitting: *Deleted

## 2015-02-28 MED ORDER — ISOSORBIDE MONONITRATE ER 60 MG PO TB24: ORAL_TABLET | ORAL | Status: DC

## 2015-02-28 MED ORDER — CLOPIDOGREL BISULFATE 75 MG PO TABS: 75.0000 mg | ORAL_TABLET | Freq: Once | ORAL | Status: DC

## 2015-02-28 MED ORDER — AMLODIPINE BESYLATE 10 MG PO TABS: 10.0000 mg | ORAL_TABLET | Freq: Every day | ORAL | Status: DC

## 2015-02-28 MED ORDER — ROSUVASTATIN CALCIUM 20 MG PO TABS: 20.0000 mg | ORAL_TABLET | Freq: Every day | ORAL | Status: DC

## 2015-02-28 MED ORDER — FAMOTIDINE 20 MG PO TABS: ORAL_TABLET | ORAL | Status: DC

## 2015-03-02 ENCOUNTER — Other Ambulatory Visit: Payer: Self-pay | Admitting: Family Medicine

## 2015-03-02 DIAGNOSIS — E892 Postprocedural hypoparathyroidism: Secondary | ICD-10-CM

## 2015-03-02 MED ORDER — CALCITRIOL 0.25 MCG PO CAPS: ORAL_CAPSULE | ORAL | Status: DC

## 2015-03-02 NOTE — Telephone Encounter (Signed)
Caller name: Shawanda Relation to pt: self Call back number: [phone] Pharmacy: [facility] on [address]  Reason for call:   Requesting calcitRIOL (ROCALTROL). 90 day supply

## 2015-03-02 NOTE — Telephone Encounter (Signed)
Last filled: 11/29/14 Amt: 270, 3 refills sent to [facility]  Pt states she no longer uses [facility] and states they are aware ([facility] removed from patient's pharmacy list).  She would like her medication sent to [facility] on [address].    90 day supply sent to preferred pharmacy.

## 2015-03-07 ENCOUNTER — Ambulatory Visit (INDEPENDENT_AMBULATORY_CARE_PROVIDER_SITE_OTHER): Payer: PPO | Admitting: Family Medicine

## 2015-03-07 ENCOUNTER — Encounter: Payer: Self-pay | Admitting: Family Medicine

## 2015-03-07 VITALS — HR 73 | Temp 98.6°F | Ht 64.0 in | Wt 189.0 lb

## 2015-03-07 DIAGNOSIS — D649 Anemia, unspecified: Secondary | ICD-10-CM

## 2015-03-07 DIAGNOSIS — K5900 Constipation, unspecified: Secondary | ICD-10-CM

## 2015-03-07 DIAGNOSIS — E038 Other specified hypothyroidism: Secondary | ICD-10-CM

## 2015-03-07 DIAGNOSIS — I10 Essential (primary) hypertension: Secondary | ICD-10-CM

## 2015-03-07 DIAGNOSIS — E892 Postprocedural hypoparathyroidism: Secondary | ICD-10-CM

## 2015-03-07 DIAGNOSIS — E785 Hyperlipidemia, unspecified: Secondary | ICD-10-CM

## 2015-03-07 DIAGNOSIS — R001 Bradycardia, unspecified: Secondary | ICD-10-CM

## 2015-03-07 DIAGNOSIS — K219 Gastro-esophageal reflux disease without esophagitis: Secondary | ICD-10-CM

## 2015-03-07 DIAGNOSIS — Z0000 Encounter for general adult medical examination without abnormal findings: Secondary | ICD-10-CM

## 2015-03-07 DIAGNOSIS — E876 Hypokalemia: Secondary | ICD-10-CM

## 2015-03-07 NOTE — Progress Notes (Signed)
Pre visit review using our clinic review tool, if applicable. No additional management support is needed unless otherwise documented below in the visit note. 

## 2015-03-07 NOTE — Patient Instructions (Addendum)
Consider a probiotic such as [employer] or [facility]  Preventive Care for Adults A healthy lifestyle and preventive care can promote health and wellness. Preventive health guidelines for women include the following key practices.  A routine yearly physical is a good way to check with your health care provider about your health and preventive screening. It is a chance to share any concerns and updates on your health and to receive a thorough exam.  Visit your dentist for a routine exam and preventive care every 6 months. Brush your teeth twice a day and floss once a day. Good oral hygiene prevents tooth decay and gum disease.  The frequency of eye exams is based on your age, health, family medical history, use of contact lenses, and other factors. Follow your health care provider's recommendations for frequency of eye exams.  Eat a healthy diet. Foods like vegetables, fruits, whole grains, low-fat dairy products, and lean protein foods contain the nutrients you need without too many calories. Decrease your intake of foods high in solid fats, added sugars, and salt. Eat the right amount of calories for you.Get information about a proper diet from your health care provider, if necessary.  Regular physical exercise is one of the most important things you can do for your health. Most adults should get at least 150 minutes of moderate-intensity exercise (any activity that increases your heart rate and causes you to sweat) each week. In addition, most adults need muscle-strengthening exercises on 2 or more days a week.  Maintain a healthy weight. The body mass index (BMI) is a screening tool to identify possible weight problems. It provides an estimate of body fat based on height and weight. Your health care provider can find your BMI and can help you achieve or maintain a healthy weight.For adults 20 years and older:  A BMI below 18.5 is considered underweight.  A BMI of 18.5 to  24.9 is normal.  A BMI of 25 to 29.9 is considered overweight.  A BMI of 30 and above is considered obese.  Maintain normal blood lipids and cholesterol levels by exercising and minimizing your intake of saturated fat. Eat a balanced diet with plenty of fruit and vegetables. Blood tests for lipids and cholesterol should begin at age 20 and be repeated every 5 years. If your lipid or cholesterol levels are high, you are over 50, or you are at high risk for heart disease, you may need your cholesterol levels checked more frequently.Ongoing high lipid and cholesterol levels should be treated with medicines if diet and exercise are not working.  If you smoke, find out from your health care provider how to quit. If you do not use tobacco, do not start.  Lung cancer screening is recommended for adults aged 55-80 years who are at high risk for developing lung cancer because of a history of smoking. A yearly low-dose CT scan of the lungs is recommended for people who have at least a 30-pack-year history of smoking and are a current smoker or have quit within the past 15 years. A pack year of smoking is smoking an average of 1 pack of cigarettes a day for 1 year (for example: 1 pack a day for 30 years or 2 packs a day for 15 years). Yearly screening should continue until the smoker has stopped smoking for at least 15 years. Yearly screening should be stopped for people who develop a health problem that would prevent them from having lung cancer treatment.    If you are pregnant, do not drink alcohol. If you are breastfeeding, be very cautious about drinking alcohol. If you are not pregnant and choose to drink alcohol, do not have more than 1 drink per day. One drink is considered to be 12 ounces (355 mL) of beer, 5 ounces (148 mL) of wine, or 1.5 ounces (44 mL) of liquor.  Avoid use of street drugs. Do not share needles with anyone. Ask for help if you need support or instructions about stopping the use of  drugs.  High blood pressure causes heart disease and increases the risk of stroke. Your blood pressure should be checked at least every 1 to 2 years. Ongoing high blood pressure should be treated with medicines if weight loss and exercise do not work.  If you are 55-79 years old, ask your health care provider if you should take aspirin to prevent strokes.  Diabetes screening involves taking a blood sample to check your fasting blood sugar level. This should be done once every 3 years, after age 45, if you are within normal weight and without risk factors for diabetes. Testing should be considered at a younger age or be carried out more frequently if you are overweight and have at least 1 risk factor for diabetes.  Breast cancer screening is essential preventive care for women. You should practice "breast self-awareness." This means understanding the normal appearance and feel of your breasts and may include breast self-examination. Any changes detected, no matter how small, should be reported to a health care provider. Women in their 20s and 30s should have a clinical breast exam (CBE) by a health care provider as part of a regular health exam every 1 to 3 years. After age 40, women should have a CBE every year. Starting at age 40, women should consider having a mammogram (breast X-ray test) every year. Women who have a family history of breast cancer should talk to their health care provider about genetic screening. Women at a high risk of breast cancer should talk to their health care providers about having an MRI and a mammogram every year.  Breast cancer gene (BRCA)-related cancer risk assessment is recommended for women who have family members with BRCA-related cancers. BRCA-related cancers include breast, ovarian, tubal, and peritoneal cancers. Having family members with these cancers may be associated with an increased risk for harmful changes (mutations) in the breast cancer genes BRCA1 and BRCA2.  Results of the assessment will determine the need for genetic counseling and BRCA1 and BRCA2 testing.  Routine pelvic exams to screen for cancer are no longer recommended for nonpregnant women who are considered low risk for cancer of the pelvic organs (ovaries, uterus, and vagina) and who do not have symptoms. Ask your health care provider if a screening pelvic exam is right for you.  If you have had past treatment for cervical cancer or a condition that could lead to cancer, you need Pap tests and screening for cancer for at least 20 years after your treatment. If Pap tests have been discontinued, your risk factors (such as having a new sexual partner) need to be reassessed to determine if screening should be resumed. Some women have medical problems that increase the chance of getting cervical cancer. In these cases, your health care provider may recommend more frequent screening and Pap tests.  The HPV test is an additional test that may be used for cervical cancer screening. The HPV test looks for the virus that can cause the cell changes   on the cervix. The cells collected during the Pap test can be tested for HPV. The HPV test could be used to screen women aged 30 years and older, and should be used in women of any age who have unclear Pap test results. After the age of 30, women should have HPV testing at the same frequency as a Pap test.  Colorectal cancer can be detected and often prevented. Most routine colorectal cancer screening begins at the age of 50 years and continues through age 75 years. However, your health care provider may recommend screening at an earlier age if you have risk factors for colon cancer. On a yearly basis, your health care provider may provide home test kits to check for hidden blood in the stool. Use of a small camera at the end of a tube, to directly examine the colon (sigmoidoscopy or colonoscopy), can detect the earliest forms of colorectal cancer. Talk to your health  care provider about this at age 50, when routine screening begins. Direct exam of the colon should be repeated every 5-10 years through age 75 years, unless early forms of pre-cancerous polyps or small growths are found.  People who are at an increased risk for hepatitis B should be screened for this virus. You are considered at high risk for hepatitis B if:  You were born in a country where hepatitis B occurs often. Talk with your health care provider about which countries are considered high risk.  Your parents were born in a high-risk country and you have not received a shot to protect against hepatitis B (hepatitis B vaccine).  You have HIV or AIDS.  You use needles to inject street drugs.  You live with, or have sex with, someone who has hepatitis B.  You get hemodialysis treatment.  You take certain medicines for conditions like cancer, organ transplantation, and autoimmune conditions.  Hepatitis C blood testing is recommended for all people born from 1945 through 1965 and any individual with known risks for hepatitis C.  Practice safe sex. Use condoms and avoid high-risk sexual practices to reduce the spread of sexually transmitted infections (STIs). STIs include gonorrhea, chlamydia, syphilis, trichomonas, herpes, HPV, and human immunodeficiency virus (HIV). Herpes, HIV, and HPV are viral illnesses that have no cure. They can result in disability, cancer, and death.  You should be screened for sexually transmitted illnesses (STIs) including gonorrhea and chlamydia if:  You are sexually active and are younger than 24 years.  You are older than 24 years and your health care provider tells you that you are at risk for this type of infection.  Your sexual activity has changed since you were last screened and you are at an increased risk for chlamydia or gonorrhea. Ask your health care provider if you are at risk.  If you are at risk of being infected with HIV, it is recommended  that you take a prescription medicine daily to prevent HIV infection. This is called preexposure prophylaxis (PrEP). You are considered at risk if:  You are a heterosexual woman, are sexually active, and are at increased risk for HIV infection.  You take drugs by injection.  You are sexually active with a partner who has HIV.  Talk with your health care provider about whether you are at high risk of being infected with HIV. If you choose to begin PrEP, you should first be tested for HIV. You should then be tested every 3 months for as long as you are taking PrEP.    Osteoporosis is a disease in which the bones lose minerals and strength with aging. This can result in serious bone fractures or breaks. The risk of osteoporosis can be identified using a bone density scan. Women ages 65 years and over and women at risk for fractures or osteoporosis should discuss screening with their health care providers. Ask your health care provider whether you should take a calcium supplement or vitamin D to reduce the rate of osteoporosis.  Menopause can be associated with physical symptoms and risks. Hormone replacement therapy is available to decrease symptoms and risks. You should talk to your health care provider about whether hormone replacement therapy is right for you.  Use sunscreen. Apply sunscreen liberally and repeatedly throughout the day. You should seek shade when your shadow is shorter than you. Protect yourself by wearing long sleeves, pants, a wide-brimmed hat, and sunglasses year round, whenever you are outdoors.  Once a month, do a whole body skin exam, using a mirror to look at the skin on your back. Tell your health care provider of new moles, moles that have irregular borders, moles that are larger than a pencil eraser, or moles that have changed in shape or color.  Stay current with required vaccines (immunizations).  Influenza vaccine. All adults should be immunized every year.  Tetanus,  diphtheria, and acellular pertussis (Td, Tdap) vaccine. Pregnant women should receive 1 dose of Tdap vaccine during each pregnancy. The dose should be obtained regardless of the length of time since the last dose. Immunization is preferred during the 27th-36th week of gestation. An adult who has not previously received Tdap or who does not know her vaccine status should receive 1 dose of Tdap. This initial dose should be followed by tetanus and diphtheria toxoids (Td) booster doses every 10 years. Adults with an unknown or incomplete history of completing a 3-dose immunization series with Td-containing vaccines should begin or complete a primary immunization series including a Tdap dose. Adults should receive a Td booster every 10 years.  Varicella vaccine. An adult without evidence of immunity to varicella should receive 2 doses or a second dose if she has previously received 1 dose. Pregnant females who do not have evidence of immunity should receive the first dose after pregnancy. This first dose should be obtained before leaving the health care facility. The second dose should be obtained 4-8 weeks after the first dose.  Human papillomavirus (HPV) vaccine. Females aged 13-26 years who have not received the vaccine previously should obtain the 3-dose series. The vaccine is not recommended for use in pregnant females. However, pregnancy testing is not needed before receiving a dose. If a female is found to be pregnant after receiving a dose, no treatment is needed. In that case, the remaining doses should be delayed until after the pregnancy. Immunization is recommended for any person with an immunocompromised condition through the age of 26 years if she did not get any or all doses earlier. During the 3-dose series, the second dose should be obtained 4-8 weeks after the first dose. The third dose should be obtained 24 weeks after the first dose and 16 weeks after the second dose.  Zoster vaccine. One dose  is recommended for adults aged 60 years or older unless certain conditions are present.  Measles, mumps, and rubella (MMR) vaccine. Adults born before 1957 generally are considered immune to measles and mumps. Adults born in 1957 or later should have 1 or more doses of MMR vaccine unless there is a   contraindication to the vaccine or there is laboratory evidence of immunity to each of the three diseases. A routine second dose of MMR vaccine should be obtained at least 28 days after the first dose for students attending postsecondary schools, health care workers, or international travelers. People who received inactivated measles vaccine or an unknown type of measles vaccine during 1963-1967 should receive 2 doses of MMR vaccine. People who received inactivated mumps vaccine or an unknown type of mumps vaccine before 1979 and are at high risk for mumps infection should consider immunization with 2 doses of MMR vaccine. For females of childbearing age, rubella immunity should be determined. If there is no evidence of immunity, females who are not pregnant should be vaccinated. If there is no evidence of immunity, females who are pregnant should delay immunization until after pregnancy. Unvaccinated health care workers born before 1957 who lack laboratory evidence of measles, mumps, or rubella immunity or laboratory confirmation of disease should consider measles and mumps immunization with 2 doses of MMR vaccine or rubella immunization with 1 dose of MMR vaccine.  Pneumococcal 13-valent conjugate (PCV13) vaccine. When indicated, a person who is uncertain of her immunization history and has no record of immunization should receive the PCV13 vaccine. An adult aged 19 years or older who has certain medical conditions and has not been previously immunized should receive 1 dose of PCV13 vaccine. This PCV13 should be followed with a dose of pneumococcal polysaccharide (PPSV23) vaccine. The PPSV23 vaccine dose should be  obtained at least 8 weeks after the dose of PCV13 vaccine. An adult aged 19 years or older who has certain medical conditions and previously received 1 or more doses of PPSV23 vaccine should receive 1 dose of PCV13. The PCV13 vaccine dose should be obtained 1 or more years after the last PPSV23 vaccine dose.  Pneumococcal polysaccharide (PPSV23) vaccine. When PCV13 is also indicated, PCV13 should be obtained first. All adults aged 65 years and older should be immunized. An adult younger than age 65 years who has certain medical conditions should be immunized. Any person who resides in a nursing home or long-term care facility should be immunized. An adult smoker should be immunized. People with an immunocompromised condition and certain other conditions should receive both PCV13 and PPSV23 vaccines. People with human immunodeficiency virus (HIV) infection should be immunized as soon as possible after diagnosis. Immunization during chemotherapy or radiation therapy should be avoided. Routine use of PPSV23 vaccine is not recommended for American Indians, [place] Natives, or people younger than 65 years unless there are medical conditions that require PPSV23 vaccine. When indicated, people who have unknown immunization and have no record of immunization should receive PPSV23 vaccine. One-time revaccination 5 years after the first dose of PPSV23 is recommended for people aged 19-64 years who have chronic kidney failure, nephrotic syndrome, asplenia, or immunocompromised conditions. People who received 1-2 doses of PPSV23 before age 65 years should receive another dose of PPSV23 vaccine at age 65 years or later if at least 5 years have passed since the previous dose. Doses of PPSV23 are not needed for people immunized with PPSV23 at or after age 65 years.  Meningococcal vaccine. Adults with asplenia or persistent complement component deficiencies should receive 2 doses of quadrivalent meningococcal conjugate  (MenACWY-D) vaccine. The doses should be obtained at least 2 months apart. Microbiologists working with certain meningococcal bacteria, military recruits, people at risk during an outbreak, and people who travel to or live in countries with a high rate   of meningitis should be immunized. A first-year college student up through age 21 years who is living in a residence hall should receive a dose if she did not receive a dose on or after her 16th birthday. Adults who have certain high-risk conditions should receive one or more doses of vaccine.  Hepatitis A vaccine. Adults who wish to be protected from this disease, have certain high-risk conditions, work with hepatitis A-infected animals, work in hepatitis A research labs, or travel to or work in countries with a high rate of hepatitis A should be immunized. Adults who were previously unvaccinated and who anticipate close contact with an international adoptee during the first 60 days after arrival in the [place] from a country with a high rate of hepatitis A should be immunized.  Hepatitis B vaccine. Adults who wish to be protected from this disease, have certain high-risk conditions, may be exposed to blood or other infectious body fluids, are household contacts or sex partners of hepatitis B positive people, are clients or workers in certain care facilities, or travel to or work in countries with a high rate of hepatitis B should be immunized.  Haemophilus influenzae type b (Hib) vaccine. A previously unvaccinated person with asplenia or sickle cell disease or having a scheduled splenectomy should receive 1 dose of Hib vaccine. Regardless of previous immunization, a recipient of a hematopoietic stem cell transplant should receive a 3-dose series 6-12 months after her successful transplant. Hib vaccine is not recommended for adults with HIV infection. Preventive Services / Frequency Ages 19 to 39 years  Blood pressure check.** / Every 1 to 2  years.  Lipid and cholesterol check.** / Every 5 years beginning at age 20.  Clinical breast exam.** / Every 3 years for women in their 20s and 30s.  BRCA-related cancer risk assessment.** / For women who have family members with a BRCA-related cancer (breast, ovarian, tubal, or peritoneal cancers).  Pap test.** / Every 2 years from ages 21 through 29. Every 3 years starting at age 30 through age 65 or 70 with a history of 3 consecutive normal Pap tests.  HPV screening.** / Every 3 years from ages 30 through ages 65 to 70 with a history of 3 consecutive normal Pap tests.  Hepatitis C blood test.** / For any individual with known risks for hepatitis C.  Skin self-exam. / Monthly.  Influenza vaccine. / Every year.  Tetanus, diphtheria, and acellular pertussis (Tdap, Td) vaccine.** / Consult your health care provider. Pregnant women should receive 1 dose of Tdap vaccine during each pregnancy. 1 dose of Td every 10 years.  Varicella vaccine.** / Consult your health care provider. Pregnant females who do not have evidence of immunity should receive the first dose after pregnancy.  HPV vaccine. / 3 doses over 6 months, if 26 and younger. The vaccine is not recommended for use in pregnant females. However, pregnancy testing is not needed before receiving a dose.  Measles, mumps, rubella (MMR) vaccine.** / You need at least 1 dose of MMR if you were born in 1957 or later. You may also need a 2nd dose. For females of childbearing age, rubella immunity should be determined. If there is no evidence of immunity, females who are not pregnant should be vaccinated. If there is no evidence of immunity, females who are pregnant should delay immunization until after pregnancy.  Pneumococcal 13-valent conjugate (PCV13) vaccine.** / Consult your health care provider.  Pneumococcal polysaccharide (PPSV23) vaccine.** / 1 to 2 doses if   you smoke cigarettes or if you have certain conditions.  Meningococcal  vaccine.** / 1 dose if you are age 19 to 21 years and a first-year college student living in a residence hall, or have one of several medical conditions, you need to get vaccinated against meningococcal disease. You may also need additional booster doses.  Hepatitis A vaccine.** / Consult your health care provider.  Hepatitis B vaccine.** / Consult your health care provider.  Haemophilus influenzae type b (Hib) vaccine.** / Consult your health care provider. Ages 40 to 64 years  Blood pressure check.** / Every 1 to 2 years.  Lipid and cholesterol check.** / Every 5 years beginning at age 20 years.  Lung cancer screening. / Every year if you are aged 55-80 years and have a 30-pack-year history of smoking and currently smoke or have quit within the past 15 years. Yearly screening is stopped once you have quit smoking for at least 15 years or develop a health problem that would prevent you from having lung cancer treatment.  Clinical breast exam.** / Every year after age 40 years.  BRCA-related cancer risk assessment.** / For women who have family members with a BRCA-related cancer (breast, ovarian, tubal, or peritoneal cancers).  Mammogram.** / Every year beginning at age 40 years and continuing for as long as you are in good health. Consult with your health care provider.  Pap test.** / Every 3 years starting at age 30 years through age 65 or 70 years with a history of 3 consecutive normal Pap tests.  HPV screening.** / Every 3 years from ages 30 years through ages 65 to 70 years with a history of 3 consecutive normal Pap tests.  Fecal occult blood test (FOBT) of stool. / Every year beginning at age 50 years and continuing until age 75 years. You may not need to do this test if you get a colonoscopy every 10 years.  Flexible sigmoidoscopy or colonoscopy.** / Every 5 years for a flexible sigmoidoscopy or every 10 years for a colonoscopy beginning at age 50 years and continuing until age 75  years.  Hepatitis C blood test.** / For all people born from 1945 through 1965 and any individual with known risks for hepatitis C.  Skin self-exam. / Monthly.  Influenza vaccine. / Every year.  Tetanus, diphtheria, and acellular pertussis (Tdap/Td) vaccine.** / Consult your health care provider. Pregnant women should receive 1 dose of Tdap vaccine during each pregnancy. 1 dose of Td every 10 years.  Varicella vaccine.** / Consult your health care provider. Pregnant females who do not have evidence of immunity should receive the first dose after pregnancy.  Zoster vaccine.** / 1 dose for adults aged 60 years or older.  Measles, mumps, rubella (MMR) vaccine.** / You need at least 1 dose of MMR if you were born in 1957 or later. You may also need a 2nd dose. For females of childbearing age, rubella immunity should be determined. If there is no evidence of immunity, females who are not pregnant should be vaccinated. If there is no evidence of immunity, females who are pregnant should delay immunization until after pregnancy.  Pneumococcal 13-valent conjugate (PCV13) vaccine.** / Consult your health care provider.  Pneumococcal polysaccharide (PPSV23) vaccine.** / 1 to 2 doses if you smoke cigarettes or if you have certain conditions.  Meningococcal vaccine.** / Consult your health care provider.  Hepatitis A vaccine.** / Consult your health care provider.  Hepatitis B vaccine.** / Consult your health care provider.  Haemophilus   influenzae type b (Hib) vaccine.** / Consult your health care provider. Ages 65 years and over  Blood pressure check.** / Every 1 to 2 years.  Lipid and cholesterol check.** / Every 5 years beginning at age 20 years.  Lung cancer screening. / Every year if you are aged 55-80 years and have a 30-pack-year history of smoking and currently smoke or have quit within the past 15 years. Yearly screening is stopped once you have quit smoking for at least 15 years or  develop a health problem that would prevent you from having lung cancer treatment.  Clinical breast exam.** / Every year after age 40 years.  BRCA-related cancer risk assessment.** / For women who have family members with a BRCA-related cancer (breast, ovarian, tubal, or peritoneal cancers).  Mammogram.** / Every year beginning at age 40 years and continuing for as long as you are in good health. Consult with your health care provider.  Pap test.** / Every 3 years starting at age 30 years through age 65 or 70 years with 3 consecutive normal Pap tests. Testing can be stopped between 65 and 70 years with 3 consecutive normal Pap tests and no abnormal Pap or HPV tests in the past 10 years.  HPV screening.** / Every 3 years from ages 30 years through ages 65 or 70 years with a history of 3 consecutive normal Pap tests. Testing can be stopped between 65 and 70 years with 3 consecutive normal Pap tests and no abnormal Pap or HPV tests in the past 10 years.  Fecal occult blood test (FOBT) of stool. / Every year beginning at age 50 years and continuing until age 75 years. You may not need to do this test if you get a colonoscopy every 10 years.  Flexible sigmoidoscopy or colonoscopy.** / Every 5 years for a flexible sigmoidoscopy or every 10 years for a colonoscopy beginning at age 50 years and continuing until age 75 years.  Hepatitis C blood test.** / For all people born from 1945 through 1965 and any individual with known risks for hepatitis C.  Osteoporosis screening.** / A one-time screening for women ages 65 years and over and women at risk for fractures or osteoporosis.  Skin self-exam. / Monthly.  Influenza vaccine. / Every year.  Tetanus, diphtheria, and acellular pertussis (Tdap/Td) vaccine.** / 1 dose of Td every 10 years.  Varicella vaccine.** / Consult your health care provider.  Zoster vaccine.** / 1 dose for adults aged 60 years or older.  Pneumococcal 13-valent conjugate  (PCV13) vaccine.** / Consult your health care provider.  Pneumococcal polysaccharide (PPSV23) vaccine.** / 1 dose for all adults aged 65 years and older.  Meningococcal vaccine.** / Consult your health care provider.  Hepatitis A vaccine.** / Consult your health care provider.  Hepatitis B vaccine.** / Consult your health care provider.  Haemophilus influenzae type b (Hib) vaccine.** / Consult your health care provider. ** Family history and personal history of risk and conditions may change your health care provider's recommendations. Document Released: 02/05/2002 Document Revised: 04/26/2014 Document Reviewed: 05/07/2011 Exit[employer] Patient Information 2015 [employer]. This information is not intended to replace advice given to you by your health care provider. Make sure you discuss any questions you have with your health care provider.  

## 2015-03-08 LAB — LIPID PANEL
CHOLESTEROL: 199 mg/dL (ref 0–200)
HDL: 48.9 mg/dL (ref >39.00)
LDL CALC: 111 mg/dL — AB (ref 0–99)
NonHDL: 150.1
Total CHOL/HDL Ratio: 4
Triglycerides: 194 mg/dL — ABNORMAL HIGH (ref 0.0–149.0)
VLDL: 38.8 mg/dL (ref 0.0–40.0)

## 2015-03-08 LAB — CBC
HCT: 40 % (ref 36.0–46.0)
Hemoglobin: 13.6 g/dL (ref 12.0–15.0)
MCHC: 33.8 g/dL (ref 30.0–36.0)
MCV: 84.2 fl (ref 78.0–100.0)
Platelets: 252 10*3/uL (ref 150.0–400.0)
RBC: 4.76 Mil/uL (ref 3.87–5.11)
RDW: 15.9 % — AB (ref 11.5–15.5)
WBC: 8.6 10*3/uL (ref 4.0–10.5)

## 2015-03-08 LAB — COMPREHENSIVE METABOLIC PANEL
ALBUMIN: 4.2 g/dL (ref 3.5–5.2)
ALT: 15 U/L (ref 0–35)
AST: 18 U/L (ref 0–37)
Alkaline Phosphatase: 86 U/L (ref 39–117)
BUN: 25 mg/dL — AB (ref 6–23)
CALCIUM: 9.2 mg/dL (ref 8.4–10.5)
CHLORIDE: 103 meq/L (ref 96–112)
CO2: 34 meq/L — AB (ref 19–32)
Creatinine, Ser: 1.16 mg/dL (ref 0.40–1.20)
GFR: 49.37 mL/min — ABNORMAL LOW (ref >60.00)
Glucose, Bld: 96 mg/dL (ref 70–99)
Potassium: 3.4 mEq/L — ABNORMAL LOW (ref 3.5–5.1)
Sodium: 143 mEq/L (ref 135–145)
Total Bilirubin: 0.3 mg/dL (ref 0.2–1.2)
Total Protein: 7.4 g/dL (ref 6.0–8.3)

## 2015-03-08 LAB — PTH, INTACT AND CALCIUM
Calcium: 8.9 mg/dL (ref 8.4–10.5)
PTH: 5 pg/mL — ABNORMAL LOW (ref 14–64)

## 2015-03-08 LAB — VITAMIN D 25 HYDROXY (VIT D DEFICIENCY, FRACTURES): VITD: 73.3 ng/mL (ref 30.00–100.00)

## 2015-03-08 LAB — TSH: TSH: 1.03 u[IU]/mL (ref 0.35–4.50)

## 2015-03-10 ENCOUNTER — Other Ambulatory Visit: Payer: Self-pay | Admitting: Family Medicine

## 2015-03-10 MED ORDER — LEVOTHYROXINE SODIUM 112 MCG PO TABS: 112.0000 ug | ORAL_TABLET | Freq: Every day | ORAL | Status: DC

## 2015-03-13 ENCOUNTER — Encounter: Payer: Self-pay | Admitting: Family Medicine

## 2015-03-13 DIAGNOSIS — Z0000 Encounter for general adult medical examination without abnormal findings: Secondary | ICD-10-CM | POA: Insufficient documentation

## 2015-03-13 NOTE — Assessment & Plan Note (Signed)
Asymptomatic, PTH stable

## 2015-03-13 NOTE — Progress Notes (Signed)
[name] [id number] [birth year] 03/13/2015      Progress Note New Patient  Subjective  Chief Complaint  Chief Complaint  Patient presents with  . Annual Exam    HPI  Patient is a 69 year old female in today for routine medical care. She is doing well generally. His ready to leave for [place] to visit her ailing sister. No recent illness. Has struggled with some reflux, manageable on a good day. Is doing well at home. No difficulties with ADLs cooking, bathing etc. Denies CP/palp/SOB/HA/congestion/fevers/GI or GU c/o. Taking meds as prescribed.  Past Medical History  Diagnosis Date  . GERD (gastroesophageal reflux disease)     occasional  . Hyperlipidemia   . Hypertension     Echocardiogram 11/19/11: Difficult acoustic windows, EF 60-65%, normal LV wall thickness, grade 1 diastolic dysfunction  . Vertigo   . Tubular adenoma of colon 06/2011  . Colitis   . Multinodular thyroid     Goiter s/p thyroidectomy in 2007 with post-op  hypocalcemia and post-op hypothyroidism/hypoparathyroidism with hypocalcemia  . CAD (coronary artery disease)     a) NSTEMI 10/2011: LHC 11/19/11: pLAD 30%, oOM 60%, AVCFX 30%, CFX after OM2 30%, pRCA 60 and 70%, then 99%, AM 80-90% with TIMI 3 flow.  PCI: Promus DES x 2 to RCA.  Marland Kitchen Post-surgical hypothyroidism   . Hypoparathyroidism   . Palpitations     a) 03/2012 - patient set up for event monitor but did not wear correctly -  she declined wearing a repeat monitor  . Unspecified constipation 05/31/2013  . Back pain 05/31/2013  . Nocturia 05/31/2013  . Neck pain on right side 07/13/2013  . MI (myocardial infarction)     november 2013  . Sun-damaged skin 12/05/2014    Past Surgical History  Procedure Laterality Date  . Colonoscopy  Aenomatous polyps    07/05/2011  . Total thyroidectomy  2007    GOITER  . Shoulder arthroscopy w/ rotator cuff repair      right  . Coronary angioplasty with stent placement    . Colonoscopy N/A 09/28/2014   Procedure: COLONOSCOPY;  Surgeon: Ladene Artist, MD;  Location: [facility];  Service: Endoscopy;  Laterality: N/A;  . Percutaneous coronary stent intervention (pci-s) N/A 11/19/2011    Procedure: PERCUTANEOUS CORONARY STENT INTERVENTION (PCI-S);  Surgeon: Hillary Bow, MD;  Location: [facility] CATH LAB;  Service: Cardiovascular;  Laterality: N/A;  . Left heart catheterization with coronary angiogram N/A 07/17/2012    Procedure: LEFT HEART CATHETERIZATION WITH CORONARY ANGIOGRAM;  Surgeon: Hillary Bow, MD;  Location: [facility] CATH LAB;  Service: Cardiovascular;  Laterality: N/A;    Family History  Problem Relation Age of Onset  . Colon cancer Brother   . Cancer Brother     COLON  . Hypertension Father   . Diabetes Neg Hx   . Prostate cancer Neg Hx   . Heart disease Father   . Breast cancer Neg Hx     History   Social History  . Marital Status: Married    Spouse Name: N/A  . Number of Children: N/A  . Years of Education: N/A   Occupational History  . 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 Smoking status: Never Smoker   . Smokeless tobacco: Never Used  . Alcohol Use: No  . Drug Use: No  . Sexual Activity: Not on file   Other Topics Concern  . Not on file  Social History Narrative    Current Outpatient Prescriptions on File Prior to Visit  Medication Sig Dispense Refill  . amLODipine (NORVASC) 10 MG tablet Take 1 tablet (10 mg total) by mouth daily. 90 tablet 1  . aspirin EC 81 MG tablet Take 81 mg by mouth daily.      . calcitRIOL (ROCALTROL) 0.25 MCG capsule Take 3 cap daily 270 capsule 0  . calcium carbonate (TUMS - DOSED IN MG ELEMENTAL CALCIUM) 500 MG chewable tablet Chew 4 tablets by mouth 2 (two) times daily.     . clopidogrel (PLAVIX) 75 MG tablet Take 1 tablet (75 mg total) by mouth once. 90 tablet 1  . CYANOCOBALAMIN PO Take 1 tablet by mouth daily.    . famotidine (PEPCID) 20 MG tablet Take 1 tablet by mouth two  times daily 180  tablet 1  . isosorbide mononitrate (IMDUR) 60 MG 24 hr tablet Take 1 tablet by mouth  daily. 90 tablet 1  . NITROSTAT 0.4 MG SL tablet Dissolve 1 tablet under the tongue every 5 minutes as   needed for chest pain as   directed 25 tablet 2  . rosuvastatin (CRESTOR) 20 MG tablet Take 1 tablet (20 mg total) by mouth daily. 90 tablet 1  . vitamin D, CHOLECALCIFEROL, 400 UNITS tablet Take 1 tablet (400 Units total) by mouth 2 (two) times daily. 60 tablet 1   No current facility-administered medications on file prior to visit.    No Known Allergies  Review of Systems  Review of Systems  Constitutional: Negative for fever, chills and malaise/fatigue.  HENT: Negative for congestion, hearing loss and nosebleeds.   Eyes: Negative for discharge.  Respiratory: Negative for cough, sputum production, shortness of breath and wheezing.   Cardiovascular: Negative for chest pain, palpitations and leg swelling.  Gastrointestinal: Positive for heartburn. Negative for nausea, vomiting, abdominal pain, diarrhea, constipation and blood in stool.  Genitourinary: Negative for dysuria, urgency, frequency and hematuria.  Musculoskeletal: Negative for myalgias, back pain and falls.  Skin: Negative for rash.  Neurological: Negative for dizziness, tremors, sensory change, focal weakness, loss of consciousness, weakness and headaches.  Endo/Heme/Allergies: Negative for polydipsia. Does not bruise/bleed easily.  Psychiatric/Behavioral: Negative for depression and suicidal ideas. The patient is not nervous/anxious and does not have insomnia.     Objective  Pulse 73  Temp(Src) 98.6 F (37 C) (Oral)  Ht 5\' 4"  (1.626 m)  Wt 189 lb (85.73 kg)  BMI 32.43 kg/m2  SpO2 99%  Physical Exam  Physical Exam  Constitutional: She is oriented to person, place, and time and well-developed, well-nourished, and in no distress. No distress.  HENT:  Head: Normocephalic and atraumatic.  Right Ear: External ear normal.  Left  Ear: External ear normal.  Nose: Nose normal.  Mouth/Throat: Oropharynx is clear and moist. No oropharyngeal exudate.  Eyes: Conjunctivae are normal. Pupils are equal, round, and reactive to light. Right eye exhibits no discharge. Left eye exhibits no discharge. No scleral icterus.  Neck: Normal range of motion. Neck supple. No thyromegaly present.  Cardiovascular: Normal rate, regular rhythm, normal heart sounds and intact distal pulses.   No murmur heard. Pulmonary/Chest: Effort normal and breath sounds normal. No respiratory distress. She has no wheezes. She has no rales.  Abdominal: Soft. Bowel sounds are normal. She exhibits no distension and no mass. There is no tenderness.  Musculoskeletal: Normal range of motion. She exhibits no edema or tenderness.  Lymphadenopathy:    She has no cervical adenopathy.  Neurological: She is alert and oriented to person, place, and time. She has normal reflexes. No cranial nerve deficit. Coordination normal.  Skin: Skin is warm and dry. No rash noted. She is not diaphoretic.  Psychiatric: Mood, memory and affect normal.       Assessment & Plan  Essential hypertension, benign Well controlled, no changes to meds. Encouraged heart healthy diet such as the DASH diet and exercise as tolerated.    GERD (gastroesophageal reflux disease) Avoid offending foods, start probiotics. Do not eat large meals in late evening and consider raising head of bed. Continue Pepcid   Hypothyroidism On Levothyroxine, continue to monitor   Postsurgical hypoparathyroidism Asymptomatic, PTH stable   Hypokalemia Mild, stable. Increase K in diet and recheck   Hypocalcemia resolved   Medicare annual wellness visit, subsequent Patient denies any difficulties at home. No trouble with ADLs, depression or falls. No recent changes to vision or hearing. Is UTD with immunizations. Is UTD with screening. Discussed Advanced Directives, patient agrees to bring [place] copies  of documents if can. Encouraged heart healthy diet, exercise as tolerated and adequate sleep. Follows with Dr Fuller Plan of gastroenterology Dr Dorris Carnes of cardiology Last colonoscopy 2015 repeat in 5 years Last [facility] 2015. Labs reviewed.   Constipation Encouraged increased hydration and fiber in diet. Daily probiotics. If bowels not moving can use MOM 2 tbls po in 4 oz of warm prune juice by mouth every 2-3 days. If no results then repeat in 4 hours with  Dulcolax suppository pr, may repeat again in 4 more hours as needed. Seek care if symptoms worsen. Consider daily Miralax and/or Dulcolax if symptoms persist.    Bradycardia RRR today   Anemia resolved   Preventative health care Patient encouraged to maintain heart healthy diet, regular exercise, adequate sleep. Consider daily probiotics. Take medications as prescribed. Annual labs reviewed. Patient is headed to [place] to visit her ailing sister. She agrees to proceed with repeat MGM and she is encouraged to return for GYN appt.

## 2015-03-13 NOTE — Assessment & Plan Note (Signed)
Encouraged increased hydration and fiber in diet. Daily probiotics. If bowels not moving can use MOM 2 tbls po in 4 oz of warm prune juice by mouth every 2-3 days. If no results then repeat in 4 hours with  Dulcolax suppository pr, may repeat again in 4 more hours as needed. Seek care if symptoms worsen. Consider daily Miralax and/or Dulcolax if symptoms persist.  

## 2015-03-13 NOTE — Assessment & Plan Note (Signed)
Mild, stable. Increase K in diet and recheck

## 2015-03-13 NOTE — Assessment & Plan Note (Signed)
Patient encouraged to maintain heart healthy diet, regular exercise, adequate sleep. Consider daily probiotics. Take medications as prescribed. Annual labs reviewed. Patient is headed to [place] to visit her ailing sister. She agrees to proceed with repeat MGM and she is encouraged to return for GYN appt.

## 2015-03-13 NOTE — Assessment & Plan Note (Signed)
RRR today 

## 2015-03-13 NOTE — Assessment & Plan Note (Signed)
Patient denies any difficulties at home. No trouble with ADLs, depression or falls. No recent changes to vision or hearing. Is UTD with immunizations. Is UTD with screening. Discussed Advanced Directives, patient agrees to bring [place] copies of documents if can. Encouraged heart healthy diet, exercise as tolerated and adequate sleep. Follows with Dr Fuller Plan of gastroenterology Dr Dorris Carnes of cardiology Last colonoscopy 2015 repeat in 5 years Last [facility] 2015. Labs reviewed.

## 2015-03-13 NOTE — Assessment & Plan Note (Signed)
resolved 

## 2015-03-13 NOTE — Assessment & Plan Note (Signed)
On Levothyroxine, continue to monitor 

## 2015-03-13 NOTE — Assessment & Plan Note (Signed)
Avoid offending foods, start probiotics. Do not eat large meals in late evening and consider raising head of bed. Continue Pepcid

## 2015-03-13 NOTE — Assessment & Plan Note (Signed)
Well controlled, no changes to meds. Encouraged heart healthy diet such as the DASH diet and exercise as tolerated.  °

## 2015-03-14 ENCOUNTER — Ambulatory Visit: Payer: Self-pay | Admitting: Family Medicine

## 2015-03-17 ENCOUNTER — Encounter: Payer: Self-pay | Admitting: General Practice

## 2015-05-17 ENCOUNTER — Telehealth: Payer: Self-pay | Admitting: Family Medicine

## 2015-05-17 ENCOUNTER — Telehealth: Payer: Self-pay | Admitting: Internal Medicine

## 2015-05-17 DIAGNOSIS — E876 Hypokalemia: Secondary | ICD-10-CM

## 2015-05-17 DIAGNOSIS — E892 Postprocedural hypoparathyroidism: Secondary | ICD-10-CM

## 2015-05-17 MED ORDER — LEVOTHYROXINE SODIUM 112 MCG PO TABS: 112.0000 ug | ORAL_TABLET | Freq: Every day | ORAL | Status: DC

## 2015-05-17 NOTE — Telephone Encounter (Signed)
Reviewed med list. OK to give rx for a 90 days supply of her maintenance meds at same dose, same sig

## 2015-05-17 NOTE — Telephone Encounter (Signed)
Caller name: Dezirae Relation to pt: self Call back number: [phone] Pharmacy:  Reason for call:   Requesting refill of all medications to be sent to [facility]

## 2015-05-17 NOTE — Telephone Encounter (Signed)
Request for surgical clearance:  1. What type of surgery is being performed? Injection- lumbar/cervical epidural   2. When is this surgery scheduled? 6/21   3. Are there any medications that need to be held prior to surgery and how long? Plavix for 5 days  4. Name of physician performing surgery? [facility]os   5. What is your office phone and fax number? [phone]  6.

## 2015-05-17 NOTE — Telephone Encounter (Signed)
I sent in this patients thyroid medication and informed her.  She is requesting ALL her meds. To be filled, but it looks as though Dr. Harrington Challenger prescribed all other maintenance medications which I did inform the patient of this.  Please advise on these refills as patient is requesting PCP to fill ALL.

## 2015-05-19 MED ORDER — FAMOTIDINE 20 MG PO TABS: ORAL_TABLET | ORAL | Status: DC

## 2015-05-19 MED ORDER — AMLODIPINE BESYLATE 10 MG PO TABS: 10.0000 mg | ORAL_TABLET | Freq: Every day | ORAL | Status: DC

## 2015-05-19 MED ORDER — NITROGLYCERIN 0.4 MG SL SUBL: SUBLINGUAL_TABLET | SUBLINGUAL | Status: DC

## 2015-05-19 MED ORDER — ROSUVASTATIN CALCIUM 20 MG PO TABS: 20.0000 mg | ORAL_TABLET | Freq: Every day | ORAL | Status: DC

## 2015-05-19 MED ORDER — ISOSORBIDE MONONITRATE ER 60 MG PO TB24: ORAL_TABLET | ORAL | Status: DC

## 2015-05-19 MED ORDER — CALCITRIOL 0.25 MCG PO CAPS: ORAL_CAPSULE | ORAL | Status: DC

## 2015-05-19 MED ORDER — CLOPIDOGREL BISULFATE 75 MG PO TABS: 75.0000 mg | ORAL_TABLET | Freq: Once | ORAL | Status: DC

## 2015-05-19 NOTE — Telephone Encounter (Signed)
Refills done and patient informed.  Also schedule lab repeat for CMP for hypokalemia per PCP lab results instructions from march.  Patient will come to the lab tomorrow 05/20/15. At 10:30am

## 2015-05-20 ENCOUNTER — Other Ambulatory Visit (INDEPENDENT_AMBULATORY_CARE_PROVIDER_SITE_OTHER): Payer: PPO

## 2015-05-20 DIAGNOSIS — E876 Hypokalemia: Secondary | ICD-10-CM

## 2015-05-20 LAB — COMPREHENSIVE METABOLIC PANEL
ALK PHOS: 79 U/L (ref 39–117)
ALT: 14 U/L (ref 0–35)
AST: 15 U/L (ref 0–37)
Albumin: 4 g/dL (ref 3.5–5.2)
BUN: 22 mg/dL (ref 6–23)
CHLORIDE: 101 meq/L (ref 96–112)
CO2: 33 meq/L — AB (ref 19–32)
CREATININE: 1.13 mg/dL (ref 0.40–1.20)
Calcium: 8.9 mg/dL (ref 8.4–10.5)
GFR: 50.86 mL/min — ABNORMAL LOW (ref >60.00)
Glucose, Bld: 78 mg/dL (ref 70–99)
Potassium: 4.2 mEq/L (ref 3.5–5.1)
SODIUM: 140 meq/L (ref 135–145)
TOTAL PROTEIN: 7.3 g/dL (ref 6.0–8.3)
Total Bilirubin: 0.4 mg/dL (ref 0.2–1.2)

## 2015-05-22 NOTE — Telephone Encounter (Signed)
OK to come off of Plavix   5 days prior Resume after procedure Pleased forward to Dr Nelva Bush

## 2015-05-24 NOTE — Telephone Encounter (Signed)
Placed information in nurse fax box to be sent over to Dr. Nelva Bush' office. Called pt and informed them that Dr. Harrington Challenger said it was ok to hold Plavix for 5 days. Pt verbalized understanding.

## 2015-06-11 ENCOUNTER — Emergency Department (HOSPITAL_COMMUNITY): Payer: PPO

## 2015-06-11 ENCOUNTER — Encounter (HOSPITAL_COMMUNITY): Payer: Self-pay | Admitting: Nurse Practitioner

## 2015-06-11 ENCOUNTER — Emergency Department (HOSPITAL_COMMUNITY)
Admission: EM | Admit: 2015-06-11 | Discharge: 2015-06-11 | Disposition: A | Discharge status: Home / Self Care | Payer: PPO | Attending: Emergency Medicine | Admitting: Emergency Medicine

## 2015-06-11 DIAGNOSIS — K219 Gastro-esophageal reflux disease without esophagitis: Secondary | ICD-10-CM | POA: Diagnosis not present

## 2015-06-11 DIAGNOSIS — Z79899 Other long term (current) drug therapy: Secondary | ICD-10-CM | POA: Diagnosis not present

## 2015-06-11 DIAGNOSIS — E209 Hypoparathyroidism, unspecified: Secondary | ICD-10-CM | POA: Insufficient documentation

## 2015-06-11 DIAGNOSIS — R079 Chest pain, unspecified: Secondary | ICD-10-CM | POA: Diagnosis present

## 2015-06-11 DIAGNOSIS — Z7982 Long term (current) use of aspirin: Secondary | ICD-10-CM | POA: Insufficient documentation

## 2015-06-11 DIAGNOSIS — I10 Essential (primary) hypertension: Secondary | ICD-10-CM | POA: Diagnosis not present

## 2015-06-11 DIAGNOSIS — Z7902 Long term (current) use of antithrombotics/antiplatelets: Secondary | ICD-10-CM | POA: Insufficient documentation

## 2015-06-11 DIAGNOSIS — I252 Old myocardial infarction: Secondary | ICD-10-CM | POA: Diagnosis not present

## 2015-06-11 DIAGNOSIS — I2510 Atherosclerotic heart disease of native coronary artery without angina pectoris: Secondary | ICD-10-CM | POA: Diagnosis not present

## 2015-06-11 DIAGNOSIS — N289 Disorder of kidney and ureter, unspecified: Secondary | ICD-10-CM | POA: Diagnosis not present

## 2015-06-11 DIAGNOSIS — Z86010 Personal history of colonic polyps: Secondary | ICD-10-CM | POA: Insufficient documentation

## 2015-06-11 DIAGNOSIS — E785 Hyperlipidemia, unspecified: Secondary | ICD-10-CM | POA: Insufficient documentation

## 2015-06-11 LAB — I-STAT CHEM 8, ED
BUN: 27 mg/dL — AB (ref 6–20)
Calcium, Ion: 1.08 mmol/L — ABNORMAL LOW (ref 1.13–1.30)
Chloride: 101 mmol/L (ref 101–111)
Creatinine, Ser: 1.3 mg/dL — ABNORMAL HIGH (ref 0.44–1.00)
Glucose, Bld: 90 mg/dL (ref 65–99)
HCT: 40 % (ref 36.0–46.0)
Hemoglobin: 13.6 g/dL (ref 12.0–15.0)
POTASSIUM: 4.1 mmol/L (ref 3.5–5.1)
SODIUM: 141 mmol/L (ref 135–145)
TCO2: 28 mmol/L (ref 0–100)

## 2015-06-11 LAB — CBC
HCT: 40.3 % (ref 36.0–46.0)
HEMOGLOBIN: 13.6 g/dL (ref 12.0–15.0)
MCH: 28.6 pg (ref 26.0–34.0)
MCHC: 33.7 g/dL (ref 30.0–36.0)
MCV: 84.8 fL (ref 78.0–100.0)
Platelets: 215 10*3/uL (ref 150–400)
RBC: 4.75 MIL/uL (ref 3.87–5.11)
RDW: 13.9 % (ref 11.5–15.5)
WBC: 8.1 10*3/uL (ref 4.0–10.5)

## 2015-06-11 LAB — BASIC METABOLIC PANEL
Anion gap: 11 (ref 5–15)
BUN: 25 mg/dL — AB (ref 6–20)
CALCIUM: 8.3 mg/dL — AB (ref 8.9–10.3)
CO2: 26 mmol/L (ref 22–32)
Chloride: 102 mmol/L (ref 101–111)
Creatinine, Ser: 1.12 mg/dL — ABNORMAL HIGH (ref 0.44–1.00)
GFR calc Af Amer: 57 mL/min — ABNORMAL LOW (ref >60)
GFR, EST NON AFRICAN AMERICAN: 49 mL/min — AB (ref >60)
Glucose, Bld: 90 mg/dL (ref 65–99)
Potassium: 4.1 mmol/L (ref 3.5–5.1)
SODIUM: 139 mmol/L (ref 135–145)

## 2015-06-11 LAB — I-STAT TROPONIN, ED
TROPONIN I, POC: 0 ng/mL (ref 0.00–0.08)
TROPONIN I, POC: 0.01 ng/mL (ref 0.00–0.08)

## 2015-06-11 NOTE — ED Provider Notes (Signed)
CSN: [id number]     Arrival date & time 06/11/15  1646 History   First MD Initiated Contact with Patient 06/11/15 1706     Chief Complaint  Patient presents with  . Chest Pain     (Consider location/radiation/quality/duration/timing/severity/associated sxs/prior Treatment) The history is provided by the patient.   patient presents with chest pain. It comes and goes and lasts a second or 2. She's been having episodes of it for a while now but much more frequent today. States it is both dull and sharp. Has a history of coronary artery disease with stents and does not know if this felt like that. No fevers. No shortness of breath. States she has had an occasional cough also. No diaphoresis. No nausea. She took a nitroglycerin earlier today and it may have helped but continued to have some episodes of it after that  Past Medical History  Diagnosis Date  . GERD (gastroesophageal reflux disease)     occasional  . Hyperlipidemia   . Hypertension     Echocardiogram 11/19/11: Difficult acoustic windows, EF 60-65%, normal LV wall thickness, grade 1 diastolic dysfunction  . Vertigo   . Tubular adenoma of colon 06/2011  . Colitis   . Multinodular thyroid     Goiter s/p thyroidectomy in 2007 with post-op  hypocalcemia and post-op hypothyroidism/hypoparathyroidism with hypocalcemia  . CAD (coronary artery disease)     a) NSTEMI 10/2011: LHC 11/19/11: pLAD 30%, oOM 60%, AVCFX 30%, CFX after OM2 30%, pRCA 60 and 70%, then 99%, AM 80-90% with TIMI 3 flow.  PCI: Promus DES x 2 to RCA.  Marland Kitchen Post-surgical hypothyroidism   . Hypoparathyroidism   . Palpitations     a) 03/2012 - patient set up for event monitor but did not wear correctly -  she declined wearing a repeat monitor  . Unspecified constipation 05/31/2013  . Back pain 05/31/2013  . Nocturia 05/31/2013  . Neck pain on right side 07/13/2013  . MI (myocardial infarction)     november 2013  . Sun-damaged skin 12/05/2014  . Medicare annual wellness visit,  subsequent 03/13/2015  . Preventative health care 03/13/2015   Past Surgical History  Procedure Laterality Date  . Colonoscopy  Aenomatous polyps    07/05/2011  . Total thyroidectomy  2007    GOITER  . Shoulder arthroscopy w/ rotator cuff repair      right  . Coronary angioplasty with stent placement    . Colonoscopy N/A 09/28/2014    Procedure: COLONOSCOPY;  Surgeon: Ladene Artist, MD;  Location: [facility];  Service: Endoscopy;  Laterality: N/A;  . Percutaneous coronary stent intervention (pci-s) N/A 11/19/2011    Procedure: PERCUTANEOUS CORONARY STENT INTERVENTION (PCI-S);  Surgeon: Hillary Bow, MD;  Location: [facility] CATH LAB;  Service: Cardiovascular;  Laterality: N/A;  . Left heart catheterization with coronary angiogram N/A 07/17/2012    Procedure: LEFT HEART CATHETERIZATION WITH CORONARY ANGIOGRAM;  Surgeon: Hillary Bow, MD;  Location: [facility] CATH LAB;  Service: Cardiovascular;  Laterality: N/A;   Family History  Problem Relation Age of Onset  . Colon cancer Brother   . Cancer Brother     COLON  . Hypertension Father   . Heart disease Father   . Diabetes Neg Hx   . Prostate cancer Neg Hx   . Breast cancer Neg Hx   . Blindness Sister   . Congestive Heart Failure Sister   . Hypertension Sister   . Thyroid disease Sister   . Cancer Brother  multiple myelomas   History  Substance Use Topics  . Smoking status: Never Smoker   . Smokeless tobacco: Never Used  . Alcohol Use: No   OB History    Gravida Para Term Preterm AB TAB SAB Ectopic Multiple Living   4 4 4       4      Review of Systems  Constitutional: Negative for activity change and appetite change.  Eyes: Negative for pain.  Respiratory: Positive for cough. Negative for chest tightness and shortness of breath.   Cardiovascular: Positive for chest pain. Negative for leg swelling.  Gastrointestinal: Negative for nausea, vomiting, abdominal pain and diarrhea.  Genitourinary: Negative for flank pain.   Musculoskeletal: Negative for back pain and neck stiffness.  Skin: Negative for rash.  Neurological: Negative for weakness, numbness and headaches.  Psychiatric/Behavioral: Negative for behavioral problems.      Allergies  Review of patient's allergies indicates no known allergies.  Home Medications   Prior to Admission medications   Medication Sig Start Date End Date Taking? Authorizing Provider  amLODipine (NORVASC) 10 MG tablet Take 1 tablet (10 mg total) by mouth daily. 05/19/15  Yes Mosie Lukes, MD  aspirin EC 81 MG tablet Take 81 mg by mouth daily.   11/22/11  Yes Dayna N Dunn, PA-C  B Complex-C (B-COMPLEX WITH VITAMIN C) tablet Take 1 tablet by mouth daily.   Yes Historical Provider, MD  calcitRIOL (ROCALTROL) 0.25 MCG capsule Take 3 cap daily Patient taking differently: Take 0.75 mcg by mouth daily. Take 3 cap daily 05/19/15  Yes Mosie Lukes, MD  calcium carbonate (TUMS - DOSED IN MG ELEMENTAL CALCIUM) 500 MG chewable tablet Chew 4 tablets by mouth 2 (two) times daily.    Yes Historical Provider, MD  cholecalciferol (VITAMIN D) 1000 UNITS tablet Take 1,000 Units by mouth daily.   Yes Historical Provider, MD  clopidogrel (PLAVIX) 75 MG tablet Take 1 tablet (75 mg total) by mouth once. Patient taking differently: Take 75 mg by mouth daily.  05/19/15  Yes Mosie Lukes, MD  famotidine (PEPCID) 20 MG tablet Take 1 tablet by mouth two  times daily Patient taking differently: Take 20 mg by mouth 2 (two) times daily.  05/19/15  Yes Mosie Lukes, MD  isosorbide mononitrate (IMDUR) 60 MG 24 hr tablet Take 1 tablet by mouth  daily. Patient taking differently: Take 60 mg by mouth daily.  05/19/15  Yes Mosie Lukes, MD  levothyroxine (SYNTHROID, LEVOTHROID) 112 MCG tablet Take 1 tablet (112 mcg total) by mouth daily. 05/17/15  Yes Mosie Lukes, MD  Menthol, Topical Analgesic, (ICY HOT EX) Apply 1 patch topically daily.   Yes Historical Provider, MD  nitroGLYCERIN (NITROSTAT) 0.4 MG  SL tablet Dissolve 1 tablet under the tongue every 5 minutes as   needed for chest pain as   directed Patient taking differently: Place 0.4 mg under the tongue every 5 (five) minutes as needed for chest pain.  05/19/15  Yes Mosie Lukes, MD  rosuvastatin (CRESTOR) 20 MG tablet Take 1 tablet (20 mg total) by mouth daily. Patient taking differently: Take 20 mg by mouth at bedtime.  05/19/15  Yes Mosie Lukes, MD  vitamin D, CHOLECALCIFEROL, 400 UNITS tablet Take 1 tablet (400 Units total) by mouth 2 (two) times daily. Patient not taking: Reported on 06/11/2015 11/22/11   Dayna N Dunn, PA-C   BP 134/61 mmHg  Pulse 66  Temp(Src) 98.3 F (36.8 C) (Oral)  Resp  13  Ht 5\' 3"  (1.6 m)  Wt 194 lb (87.998 kg)  BMI 34.37 kg/m2  SpO2 94% Physical Exam  Constitutional: She is oriented to person, place, and time. She appears well-developed and well-nourished.  HENT:  Head: Normocephalic and atraumatic.  Eyes: EOM are normal. Pupils are equal, round, and reactive to light.  Neck: Normal range of motion. Neck supple.  Cardiovascular: Normal rate, regular rhythm and normal heart sounds.   No murmur heard. Pulmonary/Chest: Effort normal and breath sounds normal. No respiratory distress. She has no wheezes. She has no rales.  Abdominal: Soft. Bowel sounds are normal. She exhibits no distension.  Musculoskeletal: Normal range of motion.  Neurological: She is alert and oriented to person, place, and time. No cranial nerve deficit.  Skin: Skin is warm.  Psychiatric: Her speech is normal.  Nursing note and vitals reviewed.   ED Course  Procedures (including critical care time) Labs Review Labs Reviewed  BASIC METABOLIC PANEL - Abnormal; Notable for the following:    BUN 25 (*)    Creatinine, Ser 1.12 (*)    Calcium 8.3 (*)    GFR calc non Af Amer 49 (*)    GFR calc Af Amer 57 (*)    All other components within normal limits  I-STAT CHEM 8, ED - Abnormal; Notable for the following:    BUN 27 (*)     Creatinine, Ser 1.30 (*)    Calcium, Ion 1.08 (*)    All other components within normal limits  CBC  I-STAT TROPOININ, ED  Randolm Idol, ED    Imaging Review Dg Chest 2 View  06/11/2015   CLINICAL DATA:  One day history of chest pressure.  EXAM: CHEST  2 VIEW  COMPARISON:  December 02, 2014  FINDINGS: There is minimal left base atelectasis. Lungs are otherwise clear. Heart size and pulmonary vascularity are normal. No adenopathy. There are surgical clips in the region of the thyroid.  IMPRESSION: Minimal left base atelectasis.  No edema or consolidation.   Electronically Signed   By: Lowella Grip III M.D.   On: 06/11/2015 17:38     EKG Interpretation   Date/Time:  Saturday June 11 2015 16:52:45 EDT Ventricular Rate:  68 PR Interval:  174 QRS Duration: 94 QT Interval:  428 QTC Calculation: 455 R Axis:   15 Text Interpretation:  Normal sinus rhythm Low voltage QRS Borderline ECG  No significant change since last tracing Confirmed by Alvino Chapel  MD,  Ovid Curd [phone]) on 06/11/2015 5:06:51 PM      MDM   Final diagnoses:  Chest pain, unspecified chest pain type  Renal insufficiency     Patient with chest pain. Lasts 1-2 seconds. Troponin negative 2. Does have some previous cardiac disease. Will discharge home to follow with her cardiologist. Also slightly elevated creatinine and will follow with her PCP.    Davonna Belling, MD 06/11/15 [phone]

## 2015-06-11 NOTE — ED Notes (Signed)
Pt c/o L sided CP onset while at rest this am, she took a nitro that helped the pain but it has returned intermittently since. Denies nausea, SOB. A&Ox4, resp e/u.

## 2015-06-11 NOTE — Discharge Instructions (Signed)

## 2015-06-21 ENCOUNTER — Other Ambulatory Visit: Payer: Self-pay

## 2015-06-21 ENCOUNTER — Encounter: Payer: Self-pay | Admitting: Nurse Practitioner

## 2015-06-21 ENCOUNTER — Other Ambulatory Visit: Payer: Self-pay | Admitting: Nurse Practitioner

## 2015-06-21 ENCOUNTER — Telehealth: Payer: Self-pay

## 2015-06-21 ENCOUNTER — Ambulatory Visit (INDEPENDENT_AMBULATORY_CARE_PROVIDER_SITE_OTHER): Payer: PPO | Admitting: Nurse Practitioner

## 2015-06-21 VITALS — BP 130/72 | HR 67 | Resp 18 | Ht 64.0 in | Wt 189.0 lb

## 2015-06-21 DIAGNOSIS — I2510 Atherosclerotic heart disease of native coronary artery without angina pectoris: Secondary | ICD-10-CM | POA: Diagnosis not present

## 2015-06-21 DIAGNOSIS — E785 Hyperlipidemia, unspecified: Secondary | ICD-10-CM | POA: Diagnosis not present

## 2015-06-21 DIAGNOSIS — R9431 Abnormal electrocardiogram [ECG] [EKG]: Secondary | ICD-10-CM

## 2015-06-21 DIAGNOSIS — R079 Chest pain, unspecified: Secondary | ICD-10-CM

## 2015-06-21 DIAGNOSIS — I10 Essential (primary) hypertension: Secondary | ICD-10-CM | POA: Diagnosis not present

## 2015-06-21 LAB — TROPONIN I: Troponin I: <0.01 ng/mL (ref <0.06)

## 2015-06-21 MED ORDER — ISOSORBIDE MONONITRATE ER 60 MG PO TB24: ORAL_TABLET | ORAL | Status: DC

## 2015-06-21 NOTE — Telephone Encounter (Signed)
-----   Message from [name], NP sent at 06/21/2015  3:47 PM EDT ----- Ok to call and let her know her troponin was negative. This is good news.   [name]

## 2015-06-21 NOTE — Telephone Encounter (Signed)
Called patient today with lab results. Per Truitt Merle NP, troponin was negative. This is good news. Patient verbalized understanding.

## 2015-06-21 NOTE — Patient Instructions (Addendum)
We will be checking the following labs today - stat Troponin   Medication Instructions:    Continue with your current medicines but I   Increase Imdur to 90 mg a day - this is a pill and a half    Testing/Procedures To Be Arranged:  Lexiscan Myoview  Follow-Up:   Needs recall OV with Dr. Harrington Challenger in August    Other Special Instructions:   N/A  Call the [place] office at [phone] if you have any questions, problems or concerns.

## 2015-06-21 NOTE — Progress Notes (Signed)
CARDIOLOGY OFFICE NOTE  Date:  06/21/2015    [name] Date of Birth: 1946-07-04 Medical Record #[id number]  PCP:  Penni Homans, MD  Cardiologist:  Harrington Challenger    Chief Complaint  Patient presents with  . Chest Pain    Post ER visit - seen for Dr. Harrington Challenger    History of Present Illness: [name] is a 69 y.o. female who presents today for a post ER visit. Seen for Dr. Harrington Challenger. She has a history of CAD - s/p NSTEMI 10/2011 with DESx2 to RCA and normal EF. She underwent repeat LHC on 07/17/12 Patent RCA stent LCA with collaterals to R acute marginal were unchanged. Subtotal occlusion of acute marginal (exits mid stent). Recomm for medical Rx at that time.  Other issues include thyroidectomy, GERD, HTN, HLD, vertigo.  Last seen here in August of 2015 - felt to be doing well.  In ER about 10 days ago with chest pain. Negative evaluation. Chest pain was fleeting. Troponin was negative x 2.  Comes in today. Here alone. She notes that her chest discomfort continues. It comes and goes. Remains fleeting - only lasts for a few seconds. She has taken NTG she says with relief. She feels like she is more sweaty than usual. This pain is totally different from her prior chest pain syndrome. She is not able to exercise. Lots of back issues. Using a cane. BP is good. On chronic Plavix and low dose nitrate therapy.   Past Medical History  Diagnosis Date  . GERD (gastroesophageal reflux disease)     occasional  . Hyperlipidemia   . Hypertension     Echocardiogram 11/19/11: Difficult acoustic windows, EF 60-65%, normal LV wall thickness, grade 1 diastolic dysfunction  . Vertigo   . Tubular adenoma of colon 06/2011  . Colitis   . Multinodular thyroid     Goiter s/p thyroidectomy in 2007 with post-op  hypocalcemia and post-op hypothyroidism/hypoparathyroidism with hypocalcemia  . CAD (coronary artery disease)     a) NSTEMI 10/2011: LHC 11/19/11: pLAD 30%, oOM 60%, AVCFX 30%, CFX after OM2 30%,  pRCA 60 and 70%, then 99%, AM 80-90% with TIMI 3 flow.  PCI: Promus DES x 2 to RCA.  Marland Kitchen Post-surgical hypothyroidism   . Hypoparathyroidism   . Palpitations     a) 03/2012 - patient set up for event monitor but did not wear correctly -  she declined wearing a repeat monitor  . Unspecified constipation 05/31/2013  . Back pain 05/31/2013  . Nocturia 05/31/2013  . Neck pain on right side 07/13/2013  . MI (myocardial infarction)     november 2013  . Sun-damaged skin 12/05/2014  . Medicare annual wellness visit, subsequent 03/13/2015  . Preventative health care 03/13/2015    Past Surgical History  Procedure Laterality Date  . Colonoscopy  Aenomatous polyps    07/05/2011  . Total thyroidectomy  2007    GOITER  . Shoulder arthroscopy w/ rotator cuff repair      right  . Coronary angioplasty with stent placement    . Colonoscopy N/A 09/28/2014    Procedure: COLONOSCOPY;  Surgeon: Ladene Artist, MD;  Location: [facility];  Service: Endoscopy;  Laterality: N/A;  . Percutaneous coronary stent intervention (pci-s) N/A 11/19/2011    Procedure: PERCUTANEOUS CORONARY STENT INTERVENTION (PCI-S);  Surgeon: Hillary Bow, MD;  Location: [facility] CATH LAB;  Service: Cardiovascular;  Laterality: N/A;  . Left heart catheterization with coronary angiogram N/A 07/17/2012  Procedure: LEFT HEART CATHETERIZATION WITH CORONARY ANGIOGRAM;  Surgeon: Hillary Bow, MD;  Location: [facility] CATH LAB;  Service: Cardiovascular;  Laterality: N/A;     Medications: Current Outpatient Prescriptions  Medication Sig Dispense Refill  . amLODipine (NORVASC) 10 MG tablet Take 1 tablet (10 mg total) by mouth daily. 90 tablet 1  . aspirin EC 81 MG tablet Take 81 mg by mouth daily.      . B Complex-C (B-COMPLEX WITH VITAMIN C) tablet Take 1 tablet by mouth daily.    . calcitRIOL (ROCALTROL) 0.25 MCG capsule Take 3 cap daily (Patient taking differently: Take 0.75 mcg by mouth daily. Take 3 cap daily) 270 capsule 1  . calcium carbonate  (TUMS - DOSED IN MG ELEMENTAL CALCIUM) 500 MG chewable tablet Chew 4 tablets by mouth 2 (two) times daily.     . cholecalciferol (VITAMIN D) 1000 UNITS tablet Take 1,000 Units by mouth daily.    . clopidogrel (PLAVIX) 75 MG tablet Take 1 tablet (75 mg total) by mouth once. (Patient taking differently: Take 75 mg by mouth daily. ) 90 tablet 1  . famotidine (PEPCID) 20 MG tablet Take 1 tablet by mouth two  times daily (Patient taking differently: Take 20 mg by mouth 2 (two) times daily. ) 180 tablet 1  . isosorbide mononitrate (IMDUR) 60 MG 24 hr tablet Take 1 1/2 tablets by mouth daily - total of 90mg . 135 tablet 3  . levothyroxine (SYNTHROID, LEVOTHROID) 112 MCG tablet Take 1 tablet (112 mcg total) by mouth daily. 90 tablet 3  . Menthol, Topical Analgesic, (ICY HOT EX) Apply 1 patch topically daily.    . nitroGLYCERIN (NITROSTAT) 0.4 MG SL tablet Dissolve 1 tablet under the tongue every 5 minutes as   needed for chest pain as   directed (Patient taking differently: Place 0.4 mg under the tongue every 5 (five) minutes as needed for chest pain. ) 25 tablet 2  . rosuvastatin (CRESTOR) 20 MG tablet Take 1 tablet (20 mg total) by mouth daily. (Patient taking differently: Take 20 mg by mouth at bedtime. ) 90 tablet 1   No current facility-administered medications for this visit.    Allergies: No Known Allergies  Social History: The patient  reports that she has never smoked. She has never used smokeless tobacco. She reports that she does not drink alcohol or use illicit drugs.   Family History: The patient's family history includes Blindness in her sister; Cancer in her brother and brother; Colon cancer in her brother; Congestive Heart Failure in her sister; Heart disease in her father; Hypertension in her father and sister; Thyroid disease in her sister. There is no history of Diabetes, Prostate cancer, or Breast cancer.   Review of Systems: Please see the history of present illness.   Otherwise,  the review of systems is positive for chest pain, leg swelling, back pain, muscle pain, wheezing, sweating, and skipped heart beats.   All other systems are reviewed and negative.   Physical Exam: VS:  BP 130/72 mmHg  Pulse 67  Resp 18  Ht 5\' 4"  (1.626 m)  Wt 189 lb (85.73 kg)  BMI 32.43 kg/m2  SpO2 96% .  BMI Body mass index is 32.43 kg/(m^2).  Wt Readings from Last 3 Encounters:  06/21/15 189 lb (85.73 kg)  06/11/15 194 lb (87.998 kg)  03/07/15 189 lb (85.73 kg)    General: Pleasant. Well developed, well nourished and in no acute distress. She is overweight.  HEENT: Normal. Neck: Supple,  no JVD, carotid bruits, or masses noted.  Cardiac: Regular rate and rhythm. No murmurs, rubs, or gallops. No edema.  Respiratory:  Lungs are clear to auscultation bilaterally with normal work of breathing.  GI: Soft and nontender.  MS: No deformity or atrophy. Gait and ROM intact. Using a cane.  Skin: Warm and dry. Color is normal.  Neuro:  Strength and sensation are intact and no gross focal deficits noted.  Psych: Alert, appropriate and with normal affect.   LABORATORY DATA:  EKG:  EKG is not ordered today. This demonstrates NSR.  This was reviewed with Dr. Tamala Julian  - NO STEMI. EKG is unchanged from 12/15 tracing.   Lab Results  Component Value Date   WBC 8.1 06/11/2015   HGB 13.6 06/11/2015   HCT 40.0 06/11/2015   PLT 215 06/11/2015   GLUCOSE 90 06/11/2015   CHOL 199 03/07/2015   TRIG 194.0* 03/07/2015   HDL 48.90 03/07/2015   LDLDIRECT 83.8 08/11/2012   LDLCALC 111* 03/07/2015   ALT 14 05/20/2015   AST 15 05/20/2015   NA 141 06/11/2015   K 4.1 06/11/2015   CL 101 06/11/2015   CREATININE 1.30* 06/11/2015   BUN 27* 06/11/2015   CO2 26 06/11/2015   TSH 1.03 03/07/2015   INR 0.94 12/02/2014   Lab Results  Component Value Date   CKTOTAL 81 05/23/2012   CKMB 1.3 05/23/2012   TROPONINI <0.30 12/02/2014     BNP (last 3 results) No results for input(s): BNP in the last 8760  hours.  ProBNP (last 3 results)  Recent Labs  09/01/14 1844  PROBNP 98.1     Other Studies Reviewed Today:  Coronary angiography: Left mainstem: Calcified with mild ostial tapering.   Left anterior descending (LAD): 30% proximal and calcified plaque with significant narrowing. The distal vessel collateralizes the distal acute marginal from the RCA, which supplies the distal PDA territory  Left circumflex (LCx): large caliber vessel. There is 40% ostial narrowing of the OM1 or ramus. The AV circ, which supplies three additional branches, is without narrowing.   Right coronary artery (RCA): This was previously stented. The overlapping mid stents are widely patent supplying a smaller PDA and moderate PLA system. There is slow flow in the second AM branch which is known to be a somewhat larger vessel with subtotal ostial narrowing. This exits mid stent. The vessel is collateralized.   Left ventriculography: Left ventricular systolic function is normal, LVEF is estimated at 55-65%, there is no significant mitral regurgitation   Final Conclusions:  1. Continued patency of the RCA stents to the distal RCA 2. No change in the LCA system with collateral flow to the distal AM territory (which supplies the distal PDA territory) 3. Subtotal ostial occlusion of the acute marginal of the RCA (See above)  Recommendations:  1. Would favor medical therapy. If symptoms are not controlled, an attempt can be made at opening the acute marginal, but it would come likely at the expense of some deformity of the RCA stent.   Reviewed with family.   Bing Quarry 07/17/2012, 12:14 PM  Assessment/Plan: 1. Chest pain - atypical - recheck EKG today and troponin. Arrange Lexiscan Myoview. Imdur is increased.   2. CAD - prior MI/PCI - arrange Lexiscan and will increase her Imdur.   3. HTN - BP ok on current regimen.   4. HLD - on statin therapy  5. Chronic back pain  Current  medicines are reviewed with the patient today.  The  patient does not have concerns regarding medicines other than what has been noted above.  The following changes have been made:  See above.  Labs/ tests ordered today include:    Orders Placed This Encounter  Procedures  . Troponin I  . Myocardial Perfusion Imaging  . EKG 12-Lead     Disposition:   FU with Dr. Harrington Challenger as planned - has August recall.   Patient is agreeable to this plan and will call if any problems develop in the interim.   Signed: [name], RN, ANP-C 06/21/2015 11:25 AM  [facility] [address] [place], [place]  [address] Phone: [phone] Fax: [phone]

## 2015-06-22 ENCOUNTER — Telehealth (HOSPITAL_COMMUNITY): Payer: Self-pay

## 2015-06-22 NOTE — Telephone Encounter (Signed)
Encounter complete. 

## 2015-06-23 ENCOUNTER — Ambulatory Visit (HOSPITAL_COMMUNITY)
Admission: RE | Admit: 2015-06-23 | Discharge: 2015-06-23 | Disposition: A | Discharge status: Home / Self Care | Payer: PPO | Source: Ambulatory Visit | Attending: Nurse Practitioner | Admitting: Nurse Practitioner

## 2015-06-23 DIAGNOSIS — I10 Essential (primary) hypertension: Secondary | ICD-10-CM | POA: Diagnosis not present

## 2015-06-23 DIAGNOSIS — R9439 Abnormal result of other cardiovascular function study: Secondary | ICD-10-CM | POA: Insufficient documentation

## 2015-06-23 DIAGNOSIS — R079 Chest pain, unspecified: Secondary | ICD-10-CM

## 2015-06-23 DIAGNOSIS — E785 Hyperlipidemia, unspecified: Secondary | ICD-10-CM

## 2015-06-23 DIAGNOSIS — I2510 Atherosclerotic heart disease of native coronary artery without angina pectoris: Secondary | ICD-10-CM | POA: Diagnosis not present

## 2015-06-23 LAB — MYOCARDIAL PERFUSION IMAGING
LV dias vol: 88 mL
LV sys vol: 36 mL
Peak HR: 102 {beats}/min
Rest HR: 58 {beats}/min
SDS: 5
SRS: 6
SSS: 11
TID: 1.03

## 2015-06-23 MED ORDER — TECHNETIUM TC 99M SESTAMIBI GENERIC - CARDIOLITE
11.0000 | Freq: Once | INTRAVENOUS | Status: AC | PRN
  Administered 2015-06-23: 11 via INTRAVENOUS

## 2015-06-23 MED ORDER — AMINOPHYLLINE 25 MG/ML IV SOLN
75.0000 mg | Freq: Once | INTRAVENOUS | Status: AC
  Administered 2015-06-23: 75 mg via INTRAVENOUS

## 2015-06-23 MED ORDER — REGADENOSON 0.4 MG/5ML IV SOLN
0.4000 mg | Freq: Once | INTRAVENOUS | Status: AC
  Administered 2015-06-23: 0.4 mg via INTRAVENOUS

## 2015-06-23 MED ORDER — TECHNETIUM TC 99M SESTAMIBI GENERIC - CARDIOLITE
33.0000 | Freq: Once | INTRAVENOUS | Status: AC | PRN
  Administered 2015-06-23: 33 via INTRAVENOUS

## 2015-07-29 ENCOUNTER — Encounter: Payer: Self-pay | Admitting: Internal Medicine

## 2015-07-29 ENCOUNTER — Ambulatory Visit (INDEPENDENT_AMBULATORY_CARE_PROVIDER_SITE_OTHER): Payer: PPO | Admitting: Internal Medicine

## 2015-07-29 VITALS — BP 124/76 | HR 78 | Ht 64.0 in | Wt 188.4 lb

## 2015-07-29 DIAGNOSIS — E785 Hyperlipidemia, unspecified: Secondary | ICD-10-CM | POA: Diagnosis not present

## 2015-07-29 DIAGNOSIS — I10 Essential (primary) hypertension: Secondary | ICD-10-CM | POA: Diagnosis not present

## 2015-07-29 NOTE — Patient Instructions (Signed)
Medication Instructions: - no changes  Labwork: - none  Procedures/Testing: - none  Follow-Up: - Keep your followup scheduled with Dr. Harrington Challenger in September  Any Additional Special Instructions Will Be Listed Below (If Applicable). - none

## 2015-07-29 NOTE — Progress Notes (Signed)
Cardiology Office Note   Date:  07/29/2015   ID:  KALEEN ROCHETTE, DOB 1946-10-12, MRN [id number]  PCP:  Penni Homans, MD  Cardiologist:   Dorris Carnes, MD   No chief complaint on file.  F/U of CAD    History of Present Illness: [name] is a 69 y.o. female with a history of  CAD - s/p NSTEMI 10/2011 with DESx2 to RCA and normal EF. She underwent repeat LHC on 07/17/12 Patent RCA stent LCA with collaterals to R acute marginal were unchanged. Subtotal occlusion of acute marginal (exits mid stent). Recomm for medical Rx at that time. Other issues include thyroidectomy, GERD, HTN, HLD, vertigo.  She was last seen by L Gerhardt in clinic  Was set up for lexiscan myoview.   This showed inferolateral scar with minimal periinfarct ischemia.  I reviewed with her on the phone  She is bothered now by neck pain  Does have intermitt CP  Wonders if it is related/referred from back. Uses occasional NTG.         Current Outpatient Prescriptions  Medication Sig Dispense Refill  . amLODipine (NORVASC) 10 MG tablet Take 1 tablet (10 mg total) by mouth daily. 90 tablet 1  . aspirin EC 81 MG tablet Take 81 mg by mouth daily.      . B Complex-C (B-COMPLEX WITH VITAMIN C) tablet Take 1 tablet by mouth daily.    . calcitRIOL (ROCALTROL) 0.25 MCG capsule Take 3 cap daily (Patient taking differently: Take 0.75 mcg by mouth daily. Take 3 cap daily) 270 capsule 1  . calcium carbonate (TUMS - DOSED IN MG ELEMENTAL CALCIUM) 500 MG chewable tablet Chew 4 tablets by mouth 2 (two) times daily.     . cholecalciferol (VITAMIN D) 1000 UNITS tablet Take 1,000 Units by mouth daily.    . clopidogrel (PLAVIX) 75 MG tablet Take 1 tablet (75 mg total) by mouth once. (Patient taking differently: Take 75 mg by mouth daily. ) 90 tablet 1  . diazepam (VALIUM) 5 MG tablet Take 5 mg by mouth at bedtime as needed. (SLEEP)  0  . famotidine (PEPCID) 20 MG tablet Take 1 tablet by mouth two  times daily (Patient  taking differently: Take 20 mg by mouth 2 (two) times daily. ) 180 tablet 1  . HYDROcodone-acetaminophen (NORCO) 10-325 MG per tablet Take 1 tablet by mouth every 4 (four) hours as needed. (PAIN)  0  . isosorbide mononitrate (IMDUR) 60 MG 24 hr tablet Take 1 1/2 tablets by mouth daily - total of 90mg . 135 tablet 3  . levothyroxine (SYNTHROID, LEVOTHROID) 112 MCG tablet Take 1 tablet (112 mcg total) by mouth daily. 90 tablet 3  . Menthol, Topical Analgesic, (ICY HOT EX) Apply 1 patch topically daily.    . nitroGLYCERIN (NITROSTAT) 0.4 MG SL tablet Dissolve 1 tablet under the tongue every 5 minutes as   needed for chest pain as   directed (Patient taking differently: Place 0.4 mg under the tongue every 5 (five) minutes as needed for chest pain. ) 25 tablet 2  . rosuvastatin (CRESTOR) 20 MG tablet Take 1 tablet (20 mg total) by mouth daily. (Patient taking differently: Take 20 mg by mouth at bedtime. ) 90 tablet 1   No current facility-administered medications for this visit.    Allergies:   Review of patient's allergies indicates no known allergies.   Past Medical History  Diagnosis Date  . GERD (gastroesophageal reflux disease)     occasional  .  Hyperlipidemia   . Hypertension     Echocardiogram 11/19/11: Difficult acoustic windows, EF 60-65%, normal LV wall thickness, grade 1 diastolic dysfunction  . Vertigo   . Tubular adenoma of colon 06/2011  . Colitis   . Multinodular thyroid     Goiter s/p thyroidectomy in 2007 with post-op  hypocalcemia and post-op hypothyroidism/hypoparathyroidism with hypocalcemia  . CAD (coronary artery disease)     a) NSTEMI 10/2011: LHC 11/19/11: pLAD 30%, oOM 60%, AVCFX 30%, CFX after OM2 30%, pRCA 60 and 70%, then 99%, AM 80-90% with TIMI 3 flow.  PCI: Promus DES x 2 to RCA.  Marland Kitchen Post-surgical hypothyroidism   . Hypoparathyroidism   . Palpitations     a) 03/2012 - patient set up for event monitor but did not wear correctly -  she declined wearing a repeat  monitor  . Unspecified constipation 05/31/2013  . Back pain 05/31/2013  . Nocturia 05/31/2013  . Neck pain on right side 07/13/2013  . MI (myocardial infarction)     november 2013  . Sun-damaged skin 12/05/2014  . Medicare annual wellness visit, subsequent 03/13/2015  . Preventative health care 03/13/2015    Past Surgical History  Procedure Laterality Date  . Colonoscopy  Aenomatous polyps    07/05/2011  . Total thyroidectomy  2007    GOITER  . Shoulder arthroscopy w/ rotator cuff repair      right  . Coronary angioplasty with stent placement    . Colonoscopy N/A 09/28/2014    Procedure: COLONOSCOPY;  Surgeon: Ladene Artist, MD;  Location: [facility];  Service: Endoscopy;  Laterality: N/A;  . Percutaneous coronary stent intervention (pci-s) N/A 11/19/2011    Procedure: PERCUTANEOUS CORONARY STENT INTERVENTION (PCI-S);  Surgeon: Hillary Bow, MD;  Location: [facility] CATH LAB;  Service: Cardiovascular;  Laterality: N/A;  . Left heart catheterization with coronary angiogram N/A 07/17/2012    Procedure: LEFT HEART CATHETERIZATION WITH CORONARY ANGIOGRAM;  Surgeon: Hillary Bow, MD;  Location: [facility] CATH LAB;  Service: Cardiovascular;  Laterality: N/A;     Social History:  The patient  reports that she has never smoked. She has never used smokeless tobacco. She reports that she does not drink alcohol or use illicit drugs.   Family History:  The patient's family history includes Blindness in her sister; Cancer in her brother and brother; Colon cancer in her brother; Congestive Heart Failure in her sister; Heart disease in her father; Hypertension in her father and sister; Thyroid disease in her sister. There is no history of Diabetes, Prostate cancer, or Breast cancer.    ROS:  Please see the history of present illness. All other systems are reviewed and  Negative to the above problem except as noted.    PHYSICAL EXAM: VS:  BP 124/76 mmHg  Pulse 78  Ht 5\' 4"  (1.626 m)  Wt 188 lb 6.4 oz  (85.458 kg)  BMI 32.32 kg/m2  SpO2 95%  GEN: Well nourished, well developed, in no acute distress HEENT: normal Neck: no JVD, carotid bruits, or masses Cardiac: RRR; no murmurs, rubs, or gallops,no edema  Respiratory:  clear to auscultation bilaterally, normal work of breathing GI: soft, nontender, nondistended, + BS  No hepatomegaly  MS: no deformity Moving all extremities   Skin: warm and dry, no rash Neuro:  Strength and sensation are intact Psych: euthymic mood, full affect   EKG:  EKG is not ordered today.   Lipid Panel    Component Value Date/Time   CHOL 199 03/07/2015  1541   TRIG 194.0* 03/07/2015 1541   HDL 48.90 03/07/2015 1541   CHOLHDL 4 03/07/2015 1541   VLDL 38.8 03/07/2015 1541   LDLCALC 111* 03/07/2015 1541   LDLDIRECT 83.8 08/11/2012 1203      Wt Readings from Last 3 Encounters:  07/29/15 188 lb 6.4 oz (85.458 kg)  06/23/15 189 lb (85.73 kg)  06/21/15 189 lb (85.73 kg)      ASSESSMENT AND PLAN:  1  CAD  I am not convinced of active angain.  Stress test with no large areas of ischemia.  I would continue medical Rx.    2.  HL  LDL 111  Zetia will be generic soon  WOuld recomm  For now continue statin  3.  DJD  I think the pt should be seen by PT    4  HTN  Adeqaute control    F/U in September       Signed, Dorris Carnes, MD  07/29/2015 10:26 AM    [address], [place], [place]  [address] Phone: [phone]; Fax: [phone]

## 2015-08-26 ENCOUNTER — Ambulatory Visit (INDEPENDENT_AMBULATORY_CARE_PROVIDER_SITE_OTHER): Payer: PPO | Admitting: Internal Medicine

## 2015-08-26 VITALS — BP 130/62 | HR 71 | Ht 64.0 in | Wt 191.0 lb

## 2015-08-26 DIAGNOSIS — I10 Essential (primary) hypertension: Secondary | ICD-10-CM

## 2015-08-26 DIAGNOSIS — E785 Hyperlipidemia, unspecified: Secondary | ICD-10-CM

## 2015-08-26 LAB — CBC
HCT: 42.2 % (ref 36.0–46.0)
Hemoglobin: 14.1 g/dL (ref 12.0–15.0)
MCHC: 33.5 g/dL (ref 30.0–36.0)
MCV: 86.1 fl (ref 78.0–100.0)
Platelets: 260 10*3/uL (ref 150.0–400.0)
RBC: 4.9 Mil/uL (ref 3.87–5.11)
RDW: 14.6 % (ref 11.5–15.5)
WBC: 9.4 10*3/uL (ref 4.0–10.5)

## 2015-08-26 LAB — LIPID PANEL
Cholesterol: 233 mg/dL — ABNORMAL HIGH (ref 0–200)
HDL: 43.4 mg/dL (ref >39.00)
LDL CALC: 150 mg/dL — AB (ref 0–99)
NonHDL: 189.17
Total CHOL/HDL Ratio: 5
Triglycerides: 198 mg/dL — ABNORMAL HIGH (ref 0.0–149.0)
VLDL: 39.6 mg/dL (ref 0.0–40.0)

## 2015-08-26 LAB — AST: AST: 18 U/L (ref 0–37)

## 2015-08-26 LAB — BASIC METABOLIC PANEL
BUN: 26 mg/dL — ABNORMAL HIGH (ref 6–23)
CHLORIDE: 104 meq/L (ref 96–112)
CO2: 32 mEq/L (ref 19–32)
Calcium: 9.3 mg/dL (ref 8.4–10.5)
Creatinine, Ser: 1.11 mg/dL (ref 0.40–1.20)
GFR: 51.87 mL/min — AB (ref >60.00)
GLUCOSE: 95 mg/dL (ref 70–99)
Potassium: 4.7 mEq/L (ref 3.5–5.1)
SODIUM: 145 meq/L (ref 135–145)

## 2015-08-26 NOTE — Patient Instructions (Signed)
Your physician recommends that you continue on your current medications as directed. Please refer to the Current Medication list given to you today. Your physician recommends that you return for lab work TODAY (BMET, CBC, LIPIDS, AST)  Your physician wants you to follow-up in: January, 2017 WITH DR. Harrington Challenger.  You will receive a reminder letter in the mail two months in advance. If you don't receive a letter, please call our office to schedule the follow-up appointment.

## 2015-08-26 NOTE — Progress Notes (Signed)
Cardiology Office Note   Date:  08/26/2015   ID:  [name], DOB 02/12/1946, MRN [id number]  PCP:  Penni Homans, MD  Cardiologist:   Dorris Carnes, MD   No chief complaint on file.  F/U of CAD    History of Present Illness: [name] is a 69 y.o. female with a history o CAD - s/p NSTEMI 10/2011 with DESx2 to RCA and normal EF. She underwent repeat LHC on 07/17/12 Patent RCA stent LCA with collaterals to R acute marginal were unchanged. Subtotal occlusion of acute marginal (exits mid stent). Recomm for medical Rx at that time. Other issues include thyroidectomy, GERD, HTN, HLD, vertigo.   lexiscan myoview earlier this year. This showed inferolateral scar with minimal periinfarct ischemia. I reviewed with her on the phone Since I saw her she has had occasional CP  Took NTG last wk Still bothered by neck pain  Is currently trying aqua therapy  Trying to delay surgery     Current Outpatient Prescriptions  Medication Sig Dispense Refill  . amLODipine (NORVASC) 10 MG tablet Take 1 tablet (10 mg total) by mouth daily. 90 tablet 1  . aspirin EC 81 MG tablet Take 81 mg by mouth daily.      . B Complex-C (B-COMPLEX WITH VITAMIN C) tablet Take 1 tablet by mouth daily.    . calcitRIOL (ROCALTROL) 0.25 MCG capsule Take 3 cap daily (Patient taking differently: Take 0.75 mcg by mouth daily. Take 3 cap daily) 270 capsule 1  . calcium carbonate (TUMS - DOSED IN MG ELEMENTAL CALCIUM) 500 MG chewable tablet Chew 4 tablets by mouth 2 (two) times daily.     . cholecalciferol (VITAMIN D) 1000 UNITS tablet Take 1,000 Units by mouth daily.    . clopidogrel (PLAVIX) 75 MG tablet Take 1 tablet (75 mg total) by mouth once. (Patient taking differently: Take 75 mg by mouth daily. ) 90 tablet 1  . diazepam (VALIUM) 5 MG tablet Take 5 mg by mouth at bedtime as needed. (SLEEP)  0  . famotidine (PEPCID) 20 MG tablet Take 1 tablet by mouth two  times daily (Patient taking differently: Take 20 mg by  mouth 2 (two) times daily. ) 180 tablet 1  . HYDROcodone-acetaminophen (NORCO) 10-325 MG per tablet Take 1 tablet by mouth every 4 (four) hours as needed. (PAIN)  0  . isosorbide mononitrate (IMDUR) 60 MG 24 hr tablet Take 1 1/2 tablets by mouth daily - total of 90mg . 135 tablet 3  . levothyroxine (SYNTHROID, LEVOTHROID) 112 MCG tablet Take 1 tablet (112 mcg total) by mouth daily. 90 tablet 3  . Menthol, Topical Analgesic, (ICY HOT EX) Apply 1 patch topically daily.    . nitroGLYCERIN (NITROSTAT) 0.4 MG SL tablet Dissolve 1 tablet under the tongue every 5 minutes as   needed for chest pain as   directed (Patient taking differently: Place 0.4 mg under the tongue every 5 (five) minutes as needed for chest pain. ) 25 tablet 2  . rosuvastatin (CRESTOR) 20 MG tablet Take 1 tablet (20 mg total) by mouth daily. (Patient taking differently: Take 20 mg by mouth at bedtime. ) 90 tablet 1   No current facility-administered medications for this visit.    Allergies:   Review of patient's allergies indicates no known allergies.   Past Medical History  Diagnosis Date  . GERD (gastroesophageal reflux disease)     occasional  . Hyperlipidemia   . Hypertension     Echocardiogram 11/19/11:  Difficult acoustic windows, EF 60-65%, normal LV wall thickness, grade 1 diastolic dysfunction  . Vertigo   . Tubular adenoma of colon 06/2011  . Colitis   . Multinodular thyroid     Goiter s/p thyroidectomy in 2007 with post-op  hypocalcemia and post-op hypothyroidism/hypoparathyroidism with hypocalcemia  . CAD (coronary artery disease)     a) NSTEMI 10/2011: LHC 11/19/11: pLAD 30%, oOM 60%, AVCFX 30%, CFX after OM2 30%, pRCA 60 and 70%, then 99%, AM 80-90% with TIMI 3 flow.  PCI: Promus DES x 2 to RCA.  Marland Kitchen Post-surgical hypothyroidism   . Hypoparathyroidism   . Palpitations     a) 03/2012 - patient set up for event monitor but did not wear correctly -  she declined wearing a repeat monitor  . Unspecified constipation  05/31/2013  . Back pain 05/31/2013  . Nocturia 05/31/2013  . Neck pain on right side 07/13/2013  . MI (myocardial infarction)     november 2013  . Sun-damaged skin 12/05/2014  . Medicare annual wellness visit, subsequent 03/13/2015  . Preventative health care 03/13/2015    Past Surgical History  Procedure Laterality Date  . Colonoscopy  Aenomatous polyps    07/05/2011  . Total thyroidectomy  2007    GOITER  . Shoulder arthroscopy w/ rotator cuff repair      right  . Coronary angioplasty with stent placement    . Colonoscopy N/A 09/28/2014    Procedure: COLONOSCOPY;  Surgeon: Ladene Artist, MD;  Location: [facility];  Service: Endoscopy;  Laterality: N/A;  . Percutaneous coronary stent intervention (pci-s) N/A 11/19/2011    Procedure: PERCUTANEOUS CORONARY STENT INTERVENTION (PCI-S);  Surgeon: Hillary Bow, MD;  Location: [facility] CATH LAB;  Service: Cardiovascular;  Laterality: N/A;  . Left heart catheterization with coronary angiogram N/A 07/17/2012    Procedure: LEFT HEART CATHETERIZATION WITH CORONARY ANGIOGRAM;  Surgeon: Hillary Bow, MD;  Location: [facility] CATH LAB;  Service: Cardiovascular;  Laterality: N/A;     Social History:  The patient  reports that she has never smoked. She has never used smokeless tobacco. She reports that she does not drink alcohol or use illicit drugs.   Family History:  The patient's family history includes Blindness in her sister; Cancer in her brother and brother; Colon cancer in her brother; Congestive Heart Failure in her sister; Heart disease in her father; Hypertension in her father and sister; Thyroid disease in her sister. There is no history of Diabetes, Prostate cancer, or Breast cancer.    ROS:  Please see the history of present illness. All other systems are reviewed and  Negative to the above problem except as noted.    PHYSICAL EXAM: VS:  BP 130/62 mmHg  Pulse 71  Ht 5\' 4"  (1.626 m)  Wt 191 lb (86.637 kg)  BMI 32.77 kg/m2  SpO2 96%    GEN: Well nourished, well developed, in no acute distress HEENT: normal Neck: no JVD, carotid bruits, or masses Cardiac: RRR; no murmurs, rubs, or gallops,no edema  Respiratory:  clear to auscultation bilaterally, normal work of breathing GI: soft, nontender, nondistended, + BS  No hepatomegaly  MS: no deformity Moving all extremities   Skin: warm and dry, no rash Neuro:  Strength and sensation are intact Psych: euthymic mood, full affect   EKG:  EKG is not ordered today.   Lipid Panel    Component Value Date/Time   CHOL 199 03/07/2015 1541   TRIG 194.0* 03/07/2015 1541   HDL 48.90 03/07/2015  1541   CHOLHDL 4 03/07/2015 1541   VLDL 38.8 03/07/2015 1541   LDLCALC 111* 03/07/2015 1541   LDLDIRECT 83.8 08/11/2012 1203      Wt Readings from Last 3 Encounters:  08/26/15 191 lb (86.637 kg)  07/29/15 188 lb 6.4 oz (85.458 kg)  06/23/15 189 lb (85.73 kg)      ASSESSMENT AND PLAN: 1.  CAD  I am not convinced of active angina   I would keep on current regimen  2.  HL  WIll get lipids   3.  DJD  Pt does not want to have surgery right now  Trying aqua therapy. \  F/U 6 month Signed, Dorris Carnes, MD  08/26/2015 9:18 AM    [address], [place], [place]  [address] Phone: [phone]; Fax: [phone]

## 2015-09-06 ENCOUNTER — Telehealth: Payer: Self-pay

## 2015-09-06 DIAGNOSIS — E785 Hyperlipidemia, unspecified: Secondary | ICD-10-CM

## 2015-09-06 MED ORDER — EZETIMIBE 10 MG PO TABS: 10.0000 mg | ORAL_TABLET | Freq: Every day | ORAL | Status: DC

## 2015-09-06 NOTE — Telephone Encounter (Signed)
Informed patient of results and verbal understanding expressed.  Instructed patient to START ZETIA 10 mg daily. FLP scheduled for 11/21. Patient agrees with treatment plan.

## 2015-09-06 NOTE — Telephone Encounter (Signed)
-----   Message from Fay Records, MD sent at 09/05/2015  1:40 PM EDT ----- Pt is still on crestor 1.  Watch saturated fats in diet 2  WOuld add Zetia to regimen 10 mg    3.  F/U lipids in 10 wks

## 2015-09-12 ENCOUNTER — Encounter: Payer: Self-pay | Admitting: Family Medicine

## 2015-09-12 ENCOUNTER — Ambulatory Visit (INDEPENDENT_AMBULATORY_CARE_PROVIDER_SITE_OTHER): Payer: PPO | Admitting: Family Medicine

## 2015-09-12 VITALS — BP 122/64 | HR 70 | Temp 98.4°F | Ht 64.0 in | Wt 192.2 lb

## 2015-09-12 DIAGNOSIS — E892 Postprocedural hypoparathyroidism: Secondary | ICD-10-CM | POA: Diagnosis not present

## 2015-09-12 DIAGNOSIS — R5383 Other fatigue: Secondary | ICD-10-CM

## 2015-09-12 DIAGNOSIS — Z23 Encounter for immunization: Secondary | ICD-10-CM | POA: Diagnosis not present

## 2015-09-12 DIAGNOSIS — E038 Other specified hypothyroidism: Secondary | ICD-10-CM | POA: Diagnosis not present

## 2015-09-12 DIAGNOSIS — M5489 Other dorsalgia: Secondary | ICD-10-CM

## 2015-09-12 DIAGNOSIS — E785 Hyperlipidemia, unspecified: Secondary | ICD-10-CM

## 2015-09-12 DIAGNOSIS — I10 Essential (primary) hypertension: Secondary | ICD-10-CM | POA: Diagnosis not present

## 2015-09-12 DIAGNOSIS — R001 Bradycardia, unspecified: Secondary | ICD-10-CM

## 2015-09-12 LAB — COMPREHENSIVE METABOLIC PANEL
ALT: 22 U/L (ref 0–35)
AST: 20 U/L (ref 0–37)
Albumin: 4.1 g/dL (ref 3.5–5.2)
Alkaline Phosphatase: 84 U/L (ref 39–117)
BUN: 20 mg/dL (ref 6–23)
CHLORIDE: 102 meq/L (ref 96–112)
CO2: 30 meq/L (ref 19–32)
Calcium: 8.5 mg/dL (ref 8.4–10.5)
Creatinine, Ser: 0.98 mg/dL (ref 0.40–1.20)
GFR: 59.88 mL/min — AB (ref >60.00)
Glucose, Bld: 99 mg/dL (ref 70–99)
Potassium: 3.9 mEq/L (ref 3.5–5.1)
Sodium: 143 mEq/L (ref 135–145)
Total Bilirubin: 0.4 mg/dL (ref 0.2–1.2)
Total Protein: 8 g/dL (ref 6.0–8.3)

## 2015-09-12 LAB — TSH: TSH: 2 u[IU]/mL (ref 0.35–4.50)

## 2015-09-12 MED ORDER — ROSUVASTATIN CALCIUM 20 MG PO TABS
20.0000 mg | ORAL_TABLET | Freq: Every day | ORAL | Status: DC
Start: 2015-09-12 — End: 2016-01-23

## 2015-09-12 MED ORDER — ZOSTER VACCINE LIVE 19400 UNT/0.65ML ~~LOC~~ SOLR: 0.6500 mL | Freq: Once | SUBCUTANEOUS | Status: DC

## 2015-09-12 MED ORDER — AMLODIPINE BESYLATE 10 MG PO TABS: 10.0000 mg | ORAL_TABLET | Freq: Every day | ORAL | Status: DC

## 2015-09-12 MED ORDER — CALCITRIOL 0.25 MCG PO CAPS: 0.7500 ug | ORAL_CAPSULE | Freq: Every day | ORAL | Status: DC

## 2015-09-12 MED ORDER — FAMOTIDINE 20 MG PO TABS: ORAL_TABLET | ORAL | Status: DC

## 2015-09-12 MED ORDER — CLOPIDOGREL BISULFATE 75 MG PO TABS: 75.0000 mg | ORAL_TABLET | Freq: Every day | ORAL | Status: DC

## 2015-09-12 MED ORDER — LEVOTHYROXINE SODIUM 112 MCG PO TABS: 112.0000 ug | ORAL_TABLET | Freq: Every day | ORAL | Status: DC

## 2015-09-12 NOTE — Progress Notes (Signed)
Pre visit review using our clinic review tool, if applicable. No additional management support is needed unless otherwise documented below in the visit note. 

## 2015-09-12 NOTE — Progress Notes (Signed)
Patient ID: [name], female   DOB: 14-Dec-1946, 69 y.o.   MRN: [id number]   Subjective:    Patient ID: [name], female    DOB: 1946-04-21, 69 y.o.   MRN: [id number]  Chief Complaint  Patient presents with  . Follow-up    HPI Patient is in today for follow-up. She is noting some persistent issues with pain is following very closely with orthopedics. She has bilateral knee pain, low back pain, neck pain and falls with Dr. Eston Esters and Dr. Ronnald Ramp. No recent falls or injuries. She is complaining of episodes of weakness and tremulousness with increased anxiety most notably in the morning and when she has not eaten. Otherwise no febrile illness or acute complaints. Denies CP/palp/SOB/HA/congestion/fevers/GI or GU c/o. Taking meds as prescribed  Past Medical History  Diagnosis Date  . GERD (gastroesophageal reflux disease)     occasional  . Hyperlipidemia   . Hypertension     Echocardiogram 11/19/11: Difficult acoustic windows, EF 60-65%, normal LV wall thickness, grade 1 diastolic dysfunction  . Vertigo   . Tubular adenoma of colon 06/2011  . Colitis   . Multinodular thyroid     Goiter s/p thyroidectomy in 2007 with post-op  hypocalcemia and post-op hypothyroidism/hypoparathyroidism with hypocalcemia  . CAD (coronary artery disease)     a) NSTEMI 10/2011: LHC 11/19/11: pLAD 30%, oOM 60%, AVCFX 30%, CFX after OM2 30%, pRCA 60 and 70%, then 99%, AM 80-90% with TIMI 3 flow.  PCI: Promus DES x 2 to RCA.  Marland Kitchen Post-surgical hypothyroidism   . Hypoparathyroidism   . Palpitations     a) 03/2012 - patient set up for event monitor but did not wear correctly -  she declined wearing a repeat monitor  . Unspecified constipation 05/31/2013  . Back pain 05/31/2013  . Nocturia 05/31/2013  . Neck pain on right side 07/13/2013  . MI (myocardial infarction)     november 2013  . Sun-damaged skin 12/05/2014  . Medicare annual wellness visit, subsequent 03/13/2015  . Preventative health care 03/13/2015      Past Surgical History  Procedure Laterality Date  . Colonoscopy  Aenomatous polyps    07/05/2011  . Total thyroidectomy  2007    GOITER  . Shoulder arthroscopy w/ rotator cuff repair      right  . Coronary angioplasty with stent placement    . Colonoscopy N/A 09/28/2014    Procedure: COLONOSCOPY;  Surgeon: Ladene Artist, MD;  Location: [facility];  Service: Endoscopy;  Laterality: N/A;  . Percutaneous coronary stent intervention (pci-s) N/A 11/19/2011    Procedure: PERCUTANEOUS CORONARY STENT INTERVENTION (PCI-S);  Surgeon: Hillary Bow, MD;  Location: [facility] CATH LAB;  Service: Cardiovascular;  Laterality: N/A;  . Left heart catheterization with coronary angiogram N/A 07/17/2012    Procedure: LEFT HEART CATHETERIZATION WITH CORONARY ANGIOGRAM;  Surgeon: Hillary Bow, MD;  Location: [facility] CATH LAB;  Service: Cardiovascular;  Laterality: N/A;    Family History  Problem Relation Age of Onset  . Colon cancer Brother   . Cancer Brother     COLON  . Hypertension Father   . Heart disease Father   . Diabetes Neg Hx   . Prostate cancer Neg Hx   . Breast cancer Neg Hx   . Blindness Sister   . Congestive Heart Failure Sister   . Hypertension Sister   . Thyroid disease Sister   . Cancer Brother     multiple myelomas    Social History  Social History  . Marital Status: Married    Spouse Name: N/A  . Number of Children: N/A  . Years of Education: N/A   Occupational History  . 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 Smoking status: Never Smoker   . Smokeless tobacco: Never Used  . Alcohol Use: No  . Drug Use: No  . Sexual Activity: Not on file   Other Topics Concern  . Not on file   Social History Narrative    Outpatient Prescriptions Prior to Visit  Medication Sig Dispense Refill  . aspirin EC 81 MG tablet Take 81 mg by mouth daily.      . B Complex-C (B-COMPLEX WITH VITAMIN C) tablet Take 1 tablet by mouth daily.    . calcium  carbonate (TUMS - DOSED IN MG ELEMENTAL CALCIUM) 500 MG chewable tablet Chew 4 tablets by mouth 2 (two) times daily.     . cholecalciferol (VITAMIN D) 1000 UNITS tablet Take 1,000 Units by mouth daily.    . diazepam (VALIUM) 5 MG tablet Take 5 mg by mouth at bedtime as needed. (SLEEP)  0  . ezetimibe (ZETIA) 10 MG tablet Take 1 tablet (10 mg total) by mouth daily. 90 tablet 3  . HYDROcodone-acetaminophen (NORCO) 10-325 MG per tablet Take 1 tablet by mouth every 4 (four) hours as needed. (PAIN)  0  . isosorbide mononitrate (IMDUR) 60 MG 24 hr tablet Take 1 1/2 tablets by mouth daily - total of 90mg . 135 tablet 3  . Menthol, Topical Analgesic, (ICY HOT EX) Apply 1 patch topically daily.    . nitroGLYCERIN (NITROSTAT) 0.4 MG SL tablet Dissolve 1 tablet under the tongue every 5 minutes as   needed for chest pain as   directed (Patient taking differently: Place 0.4 mg under the tongue every 5 (five) minutes as needed for chest pain. ) 25 tablet 2  . amLODipine (NORVASC) 10 MG tablet Take 1 tablet (10 mg total) by mouth daily. 90 tablet 1  . calcitRIOL (ROCALTROL) 0.25 MCG capsule Take 3 cap daily (Patient taking differently: Take 0.75 mcg by mouth daily. Take 3 cap daily) 270 capsule 1  . clopidogrel (PLAVIX) 75 MG tablet Take 1 tablet (75 mg total) by mouth once. (Patient taking differently: Take 75 mg by mouth daily. ) 90 tablet 1  . famotidine (PEPCID) 20 MG tablet Take 1 tablet by mouth two  times daily (Patient taking differently: Take 20 mg by mouth 2 (two) times daily. ) 180 tablet 1  . levothyroxine (SYNTHROID, LEVOTHROID) 112 MCG tablet Take 1 tablet (112 mcg total) by mouth daily. 90 tablet 3  . rosuvastatin (CRESTOR) 20 MG tablet Take 1 tablet (20 mg total) by mouth daily. (Patient taking differently: Take 20 mg by mouth at bedtime. ) 90 tablet 1   No facility-administered medications prior to visit.    No Known Allergies  Review of Systems  Constitutional: Positive for malaise/fatigue.  Negative for fever.  HENT: Negative for congestion.   Eyes: Negative for discharge.  Respiratory: Negative for shortness of breath.   Cardiovascular: Negative for chest pain, palpitations and leg swelling.  Gastrointestinal: Negative for nausea and abdominal pain.  Genitourinary: Negative for dysuria.  Musculoskeletal: Negative for falls.  Skin: Negative for rash.  Neurological: Positive for tremors. Negative for loss of consciousness and headaches.  Endo/Heme/Allergies: Negative for environmental allergies.  Psychiatric/Behavioral: Negative for depression. The patient is not nervous/anxious.  Objective:    Physical Exam  Constitutional: She is oriented to person, place, and time. She appears well-developed and well-nourished. No distress.  HENT:  Head: Normocephalic and atraumatic.  Eyes: Conjunctivae are normal.  Neck: Neck supple. No thyromegaly present.  Cardiovascular: Normal rate, regular rhythm and normal heart sounds.   No murmur heard. Pulmonary/Chest: Effort normal and breath sounds normal. No respiratory distress.  Abdominal: Soft. Bowel sounds are normal. She exhibits no distension and no mass. There is no tenderness.  Musculoskeletal: She exhibits no edema.  Lymphadenopathy:    She has no cervical adenopathy.  Neurological: She is alert and oriented to person, place, and time.  Skin: Skin is warm and dry.  Psychiatric: She has a normal mood and affect. Her behavior is normal.    BP 122/64 mmHg  Pulse 70  Temp(Src) 98.4 F (36.9 C) (Oral)  Ht 5\' 4"  (1.626 m)  Wt 192 lb 4 oz (87.204 kg)  BMI 32.98 kg/m2  SpO2 95% Wt Readings from Last 3 Encounters:  09/12/15 192 lb 4 oz (87.204 kg)  08/26/15 191 lb (86.637 kg)  07/29/15 188 lb 6.4 oz (85.458 kg)     Lab Results  Component Value Date   WBC 9.4 08/26/2015   HGB 14.1 08/26/2015   HCT 42.2 08/26/2015   PLT 260.0 08/26/2015   GLUCOSE 99 09/12/2015   CHOL 233* 08/26/2015   TRIG 198.0* 08/26/2015    HDL 43.40 08/26/2015   LDLDIRECT 83.8 08/11/2012   LDLCALC 150* 08/26/2015   ALT 22 09/12/2015   AST 20 09/12/2015   NA 143 09/12/2015   K 3.9 09/12/2015   CL 102 09/12/2015   CREATININE 0.98 09/12/2015   BUN 20 09/12/2015   CO2 30 09/12/2015   TSH 2.00 09/12/2015   INR 0.94 12/02/2014    Lab Results  Component Value Date   TSH 2.00 09/12/2015   Lab Results  Component Value Date   WBC 9.4 08/26/2015   HGB 14.1 08/26/2015   HCT 42.2 08/26/2015   MCV 86.1 08/26/2015   PLT 260.0 08/26/2015   Lab Results  Component Value Date   NA 143 09/12/2015   K 3.9 09/12/2015   CO2 30 09/12/2015   GLUCOSE 99 09/12/2015   BUN 20 09/12/2015   CREATININE 0.98 09/12/2015   BILITOT 0.4 09/12/2015   ALKPHOS 84 09/12/2015   AST 20 09/12/2015   ALT 22 09/12/2015   PROT 8.0 09/12/2015   ALBUMIN 4.1 09/12/2015   CALCIUM 8.5 09/12/2015   ANIONGAP 11 06/11/2015   GFR 59.88* 09/12/2015   Lab Results  Component Value Date   CHOL 233* 08/26/2015   Lab Results  Component Value Date   HDL 43.40 08/26/2015   Lab Results  Component Value Date   LDLCALC 150* 08/26/2015   Lab Results  Component Value Date   TRIG 198.0* 08/26/2015   Lab Results  Component Value Date   CHOLHDL 5 08/26/2015   No results found for: HGBA1C     Assessment & Plan:   Problem List Items Addressed This Visit    Postsurgical hypoparathyroidism   Relevant Medications   calcitRIOL (ROCALTROL) 0.25 MCG capsule   Other fatigue    Describes some episodes of weakness and tremulousness usually in the morning when she has not eaten anything, suspect her sugar is dropping. Encouraged small, frequent meals with minimal complex carbs and plenty of lean proteins and healthy fats      Hypothyroidism - Primary    On Levothyroxine,  continue to monitor      Relevant Medications   levothyroxine (SYNTHROID, LEVOTHROID) 112 MCG tablet   Other Relevant Orders   TSH (Completed)   Hyperlipidemia    Tolerating  statin, encouraged heart healthy diet, avoid trans fats, minimize simple carbs and saturated fats. Increase exercise as tolerated      Relevant Medications   amLODipine (NORVASC) 10 MG tablet   rosuvastatin (CRESTOR) 20 MG tablet   Essential hypertension, benign    Well controlled, no changes to meds. Encouraged heart healthy diet such as the DASH diet and exercise as tolerated.       Relevant Medications   amLODipine (NORVASC) 10 MG tablet   rosuvastatin (CRESTOR) 20 MG tablet   Other Relevant Orders   Comprehensive metabolic panel (Completed)   Bradycardia    RRR today      Back pain    left thoracic Follows with Antionette Char, Dr Nelva Bush Also Dr Ronnald Ramp with [facility]       Other Visit Diagnoses    Need for prophylactic vaccination and inoculation against unspecified single disease        Relevant Medications    zoster vaccine live, PF, (ZOSTAVAX) 66063 UNT/0.65ML injection    Other Relevant Orders    Pneumococcal conjugate vaccine 13-valent (Completed)       I have changed Ms. Kath's calcitRIOL and clopidogrel. I am also having her start on zoster vaccine live (PF). Additionally, I am having her maintain her calcium carbonate, aspirin EC, nitroGLYCERIN, B-complex with vitamin C, cholecalciferol, (Menthol, Topical Analgesic, (ICY HOT EX)), isosorbide mononitrate, diazepam, HYDROcodone-acetaminophen, ezetimibe, amLODipine, famotidine, levothyroxine, and rosuvastatin.  Meds ordered this encounter  Medications  . zoster vaccine live, PF, (ZOSTAVAX) 01601 UNT/0.65ML injection    Sig: Inject 19,400 Units into the skin once.    Dispense:  1 each    Refill:  0    Do not get until after 10/25/15  . amLODipine (NORVASC) 10 MG tablet    Sig: Take 1 tablet (10 mg total) by mouth daily.    Dispense:  90 tablet    Refill:  1  . calcitRIOL (ROCALTROL) 0.25 MCG capsule    Sig: Take 3 capsules (0.75 mcg total) by mouth daily. Take 3 cap daily    Dispense:  270 capsule      Refill:  1  . clopidogrel (PLAVIX) 75 MG tablet    Sig: Take 1 tablet (75 mg total) by mouth daily.    Dispense:  90 tablet    Refill:  1  . famotidine (PEPCID) 20 MG tablet    Sig: Take 1 tablet by mouth two  times daily    Dispense:  180 tablet    Refill:  1  . levothyroxine (SYNTHROID, LEVOTHROID) 112 MCG tablet    Sig: Take 1 tablet (112 mcg total) by mouth daily.    Dispense:  90 tablet    Refill:  3  . rosuvastatin (CRESTOR) 20 MG tablet    Sig: Take 1 tablet (20 mg total) by mouth daily.    Dispense:  90 tablet    Refill:  1     Penni Homans, MD

## 2015-09-12 NOTE — Patient Instructions (Signed)

## 2015-09-13 ENCOUNTER — Telehealth: Payer: Self-pay | Admitting: Family Medicine

## 2015-09-13 NOTE — Telephone Encounter (Signed)
Patient informed of PCP instructions. 

## 2015-09-13 NOTE — Telephone Encounter (Signed)
Could be but usually does not happen that way. Can be low grade for a day or two. Use Tylenol 325 mg tabs 2 tabs tid prn fever x 3 days to manage and seek care if severe symptoms occur

## 2015-09-13 NOTE — Telephone Encounter (Signed)
The patient woke this morning with a fever.  She was seen in the office yesterday and had Prevnar 13 and wanted to know if related??

## 2015-09-18 ENCOUNTER — Encounter: Payer: Self-pay | Admitting: Family Medicine

## 2015-09-18 NOTE — Assessment & Plan Note (Signed)
Tolerating statin, encouraged heart healthy diet, avoid trans fats, minimize simple carbs and saturated fats. Increase exercise as tolerated 

## 2015-09-18 NOTE — Assessment & Plan Note (Signed)
Describes some episodes of weakness and tremulousness usually in the morning when she has not eaten anything, suspect her sugar is dropping. Encouraged small, frequent meals with minimal complex carbs and plenty of lean proteins and healthy fats

## 2015-09-18 NOTE — Assessment & Plan Note (Signed)
left thoracic Follows with Antionette Char, Dr Nelva Bush Also Dr Ronnald Ramp with [facility]

## 2015-09-18 NOTE — Assessment & Plan Note (Signed)
Well controlled, no changes to meds. Encouraged heart healthy diet such as the DASH diet and exercise as tolerated.  °

## 2015-09-18 NOTE — Assessment & Plan Note (Signed)
RRR today 

## 2015-09-18 NOTE — Assessment & Plan Note (Signed)
On Levothyroxine, continue to monitor 

## 2015-09-26 ENCOUNTER — Emergency Department (HOSPITAL_COMMUNITY): Payer: PPO

## 2015-09-26 ENCOUNTER — Encounter (HOSPITAL_COMMUNITY): Payer: Self-pay | Admitting: *Deleted

## 2015-09-26 ENCOUNTER — Emergency Department (HOSPITAL_COMMUNITY)
Admission: EM | Admit: 2015-09-26 | Discharge: 2015-09-26 | Disposition: A | Discharge status: Home / Self Care | Payer: PPO | Attending: Emergency Medicine | Admitting: Emergency Medicine

## 2015-09-26 DIAGNOSIS — K5900 Constipation, unspecified: Secondary | ICD-10-CM | POA: Diagnosis not present

## 2015-09-26 DIAGNOSIS — Z7902 Long term (current) use of antithrombotics/antiplatelets: Secondary | ICD-10-CM | POA: Diagnosis not present

## 2015-09-26 DIAGNOSIS — I252 Old myocardial infarction: Secondary | ICD-10-CM | POA: Diagnosis not present

## 2015-09-26 DIAGNOSIS — I10 Essential (primary) hypertension: Secondary | ICD-10-CM | POA: Insufficient documentation

## 2015-09-26 DIAGNOSIS — E785 Hyperlipidemia, unspecified: Secondary | ICD-10-CM | POA: Insufficient documentation

## 2015-09-26 DIAGNOSIS — R202 Paresthesia of skin: Secondary | ICD-10-CM | POA: Diagnosis not present

## 2015-09-26 DIAGNOSIS — Z7982 Long term (current) use of aspirin: Secondary | ICD-10-CM | POA: Diagnosis not present

## 2015-09-26 DIAGNOSIS — I2510 Atherosclerotic heart disease of native coronary artery without angina pectoris: Secondary | ICD-10-CM | POA: Insufficient documentation

## 2015-09-26 DIAGNOSIS — E039 Hypothyroidism, unspecified: Secondary | ICD-10-CM | POA: Insufficient documentation

## 2015-09-26 DIAGNOSIS — K219 Gastro-esophageal reflux disease without esophagitis: Secondary | ICD-10-CM | POA: Diagnosis not present

## 2015-09-26 DIAGNOSIS — Z79899 Other long term (current) drug therapy: Secondary | ICD-10-CM | POA: Insufficient documentation

## 2015-09-26 DIAGNOSIS — R079 Chest pain, unspecified: Secondary | ICD-10-CM | POA: Diagnosis present

## 2015-09-26 LAB — BASIC METABOLIC PANEL
ANION GAP: 12 (ref 5–15)
BUN: 22 mg/dL — ABNORMAL HIGH (ref 6–20)
CALCIUM: 8.1 mg/dL — AB (ref 8.9–10.3)
CO2: 24 mmol/L (ref 22–32)
Chloride: 106 mmol/L (ref 101–111)
Creatinine, Ser: 1 mg/dL (ref 0.44–1.00)
GFR calc Af Amer: >60 mL/min (ref >60)
GFR, EST NON AFRICAN AMERICAN: 57 mL/min — AB (ref >60)
GLUCOSE: 107 mg/dL — AB (ref 65–99)
Potassium: 4.6 mmol/L (ref 3.5–5.1)
SODIUM: 142 mmol/L (ref 135–145)

## 2015-09-26 LAB — CBC
HCT: 42.5 % (ref 36.0–46.0)
HEMOGLOBIN: 13.9 g/dL (ref 12.0–15.0)
MCH: 28.8 pg (ref 26.0–34.0)
MCHC: 32.7 g/dL (ref 30.0–36.0)
MCV: 88.2 fL (ref 78.0–100.0)
Platelets: 320 10*3/uL (ref 150–400)
RBC: 4.82 MIL/uL (ref 3.87–5.11)
RDW: 14.2 % (ref 11.5–15.5)
WBC: 14.3 10*3/uL — ABNORMAL HIGH (ref 4.0–10.5)

## 2015-09-26 LAB — I-STAT TROPONIN, ED: TROPONIN I, POC: 0.01 ng/mL (ref 0.00–0.08)

## 2015-09-26 NOTE — ED Notes (Signed)
Patient already in gown and placed on monitor, by Tonia Ghent, NT; placed patient on continuous pulse oximetry and blood pressure cuff; visitor at bedside

## 2015-09-26 NOTE — ED Notes (Signed)
Pt states started having chest pain last nite and took tablet under tongue and made it better.  Pt states all over body feels numb and she feels weak all over.

## 2015-09-26 NOTE — ED Provider Notes (Signed)
CSN: [id number]     Arrival date & time 09/26/15  1219 History   First MD Initiated Contact with Patient 09/26/15 1500     Chief Complaint  Patient presents with  . Chest Pain     (Consider location/radiation/quality/duration/timing/severity/associated sxs/prior Treatment) HPI   [name] is a 69 y.o. female with PMH significant for CAD, HLD, HTN, NSTEMI (2012) who presents with b/l numbness and tingling in the left leg after she awoke this morning.  The numbness and tingling then went to the right leg, and now she is just experiencing the numbness and tingling on the bottom of her feet.  She states she had some CP this morning that she describes as a "pinch" that doesn't last long.  She sees Dr. Harrington Challenger in cardiology.  She is not experiencing CP now.  She is able to walk, but states that her legs feel heavy.  Denies loss of bowel/bladder, CP, SOB, visual disturbances, fevers, chills, N/V/D, dysuria, increased frequency, facial drooping, or slurred speech.   Past Medical History  Diagnosis Date  . GERD (gastroesophageal reflux disease)     occasional  . Hyperlipidemia   . Hypertension     Echocardiogram 11/19/11: Difficult acoustic windows, EF 60-65%, normal LV wall thickness, grade 1 diastolic dysfunction  . Vertigo   . Tubular adenoma of colon 06/2011  . Colitis   . Multinodular thyroid     Goiter s/p thyroidectomy in 2007 with post-op  hypocalcemia and post-op hypothyroidism/hypoparathyroidism with hypocalcemia  . CAD (coronary artery disease)     a) NSTEMI 10/2011: LHC 11/19/11: pLAD 30%, oOM 60%, AVCFX 30%, CFX after OM2 30%, pRCA 60 and 70%, then 99%, AM 80-90% with TIMI 3 flow.  PCI: Promus DES x 2 to RCA.  Marland Kitchen Post-surgical hypothyroidism   . Hypoparathyroidism ([place])   . Palpitations     a) 03/2012 - patient set up for event monitor but did not wear correctly -  she declined wearing a repeat monitor  . Unspecified constipation 05/31/2013  . Back pain 05/31/2013  . Nocturia  05/31/2013  . Neck pain on right side 07/13/2013  . MI (myocardial infarction) [facility])     november 2013  . Sun-damaged skin 12/05/2014  . Medicare annual wellness visit, subsequent 03/13/2015  . Preventative health care 03/13/2015   Past Surgical History  Procedure Laterality Date  . Colonoscopy  Aenomatous polyps    07/05/2011  . Total thyroidectomy  2007    GOITER  . Shoulder arthroscopy w/ rotator cuff repair      right  . Coronary angioplasty with stent placement    . Colonoscopy N/A 09/28/2014    Procedure: COLONOSCOPY;  Surgeon: Ladene Artist, MD;  Location: [facility];  Service: Endoscopy;  Laterality: N/A;  . Percutaneous coronary stent intervention (pci-s) N/A 11/19/2011    Procedure: PERCUTANEOUS CORONARY STENT INTERVENTION (PCI-S);  Surgeon: Hillary Bow, MD;  Location: [facility] CATH LAB;  Service: Cardiovascular;  Laterality: N/A;  . Left heart catheterization with coronary angiogram N/A 07/17/2012    Procedure: LEFT HEART CATHETERIZATION WITH CORONARY ANGIOGRAM;  Surgeon: Hillary Bow, MD;  Location: [facility] CATH LAB;  Service: Cardiovascular;  Laterality: N/A;   Family History  Problem Relation Age of Onset  . Colon cancer Brother   . Cancer Brother     COLON  . Hypertension Father   . Heart disease Father   . Diabetes Neg Hx   . Prostate cancer Neg Hx   . Breast cancer Neg Hx   .  Blindness Sister   . Congestive Heart Failure Sister   . Hypertension Sister   . Thyroid disease Sister   . Cancer Brother     multiple myelomas   Social History  Substance Use Topics  . Smoking status: Never Smoker   . Smokeless tobacco: Never Used  . Alcohol Use: No   OB History    Gravida Para Term Preterm AB TAB SAB Ectopic Multiple Living   4 4 4       4      Review of Systems All other systems negative unless otherwise stated in HPI    Allergies  Review of patient's allergies indicates no known allergies.  Home Medications   Prior to Admission medications    Medication Sig Start Date End Date Taking? Authorizing Provider  amLODipine (NORVASC) 10 MG tablet Take 1 tablet (10 mg total) by mouth daily. 09/12/15   Mosie Lukes, MD  aspirin EC 81 MG tablet Take 81 mg by mouth daily.   11/22/11   Dayna N Dunn, PA-C  B Complex-C (B-COMPLEX WITH VITAMIN C) tablet Take 1 tablet by mouth daily.    Historical Provider, MD  calcitRIOL (ROCALTROL) 0.25 MCG capsule Take 3 capsules (0.75 mcg total) by mouth daily. Take 3 cap daily 09/12/15   Mosie Lukes, MD  calcium carbonate (TUMS - DOSED IN MG ELEMENTAL CALCIUM) 500 MG chewable tablet Chew 4 tablets by mouth 2 (two) times daily.     Historical Provider, MD  cholecalciferol (VITAMIN D) 1000 UNITS tablet Take 1,000 Units by mouth daily.    Historical Provider, MD  clopidogrel (PLAVIX) 75 MG tablet Take 1 tablet (75 mg total) by mouth daily. 09/12/15   Mosie Lukes, MD  diazepam (VALIUM) 5 MG tablet Take 5 mg by mouth at bedtime as needed. (SLEEP) 07/01/15   Historical Provider, MD  ezetimibe (ZETIA) 10 MG tablet Take 1 tablet (10 mg total) by mouth daily. 09/06/15   Fay Records, MD  famotidine (PEPCID) 20 MG tablet Take 1 tablet by mouth two  times daily 09/12/15   Mosie Lukes, MD  HYDROcodone-acetaminophen [facility]) 10-325 MG per tablet Take 1 tablet by mouth every 4 (four) hours as needed. (PAIN) 07/19/15   Historical Provider, MD  isosorbide mononitrate (IMDUR) 60 MG 24 hr tablet Take 1 1/2 tablets by mouth daily - total of 90mg . 06/21/15   [name], NP  levothyroxine (SYNTHROID, LEVOTHROID) 112 MCG tablet Take 1 tablet (112 mcg total) by mouth daily. 09/12/15   Mosie Lukes, MD  Menthol, Topical Analgesic, (ICY HOT EX) Apply 1 patch topically daily.    Historical Provider, MD  nitroGLYCERIN (NITROSTAT) 0.4 MG SL tablet Dissolve 1 tablet under the tongue every 5 minutes as   needed for chest pain as   directed Patient taking differently: Place 0.4 mg under the tongue every 5 (five) minutes as needed for  chest pain.  05/19/15   Mosie Lukes, MD  rosuvastatin (CRESTOR) 20 MG tablet Take 1 tablet (20 mg total) by mouth daily. 09/12/15   Mosie Lukes, MD  zoster vaccine live, PF, (ZOSTAVAX) 66440 UNT/0.65ML injection Inject 19,400 Units into the skin once. 09/12/15   Mosie Lukes, MD   BP 140/64 mmHg  Pulse 63  Temp(Src) 97.9 F (36.6 C) (Oral)  Resp 14  SpO2 97% Physical Exam  Constitutional: She is oriented to person, place, and time. She appears well-developed and well-nourished.  HENT:  Head: Normocephalic and atraumatic.  Mouth/Throat: Oropharynx is clear and moist.  Eyes: Conjunctivae and EOM are normal. Pupils are equal, round, and reactive to light.  Neck: Normal range of motion. Neck supple.  Cardiovascular: Normal rate, regular rhythm, normal heart sounds and intact distal pulses.   No murmur heard. Pulmonary/Chest: Effort normal and breath sounds normal. No respiratory distress. She has no wheezes. She has no rales.  Abdominal: Soft. Bowel sounds are normal. She exhibits no distension. There is no tenderness.  Musculoskeletal: Normal range of motion.  Lymphadenopathy:    She has no cervical adenopathy.  Neurological: She is alert and oriented to person, place, and time.  Speech is clear without dysarthria.  Moves all extremities without ataxia.   Mental Status:   AOx3 Cranial Nerves:  I-not tested  II-PERRLA  III, IV, VI-EOMs intact  V-temporal and masseter strength intact  VII-symmetrical facial movements intact, no facial droop  VIII-hearing grossly intact bilaterally  IX, X-gag intact  XI-strength of sternomastoid and trapezius muscles 5/5  XII-tongue midline Motor:   Good muscle bulk and tone  Strength 5/5 bilaterally in upper and lower extremities   Cerebellar--RAMs, finger to nose intact  No pronator drift Sensory:  Intact in upper and lower extremities       Skin: Skin is warm and dry.  Psychiatric: She has a normal mood and affect. Her behavior  is normal.    ED Course  Procedures (including critical care time) Labs Review Labs Reviewed  BASIC METABOLIC PANEL - Abnormal; Notable for the following:    Glucose, Bld 107 (*)    BUN 22 (*)    Calcium 8.1 (*)    GFR calc non Af Amer 57 (*)    All other components within normal limits  CBC - Abnormal; Notable for the following:    WBC 14.3 (*)    All other components within normal limits  I-STAT TROPOININ, ED    Imaging Review Dg Chest 2 View  09/26/2015   CLINICAL DATA:  Chest pain and shortness of breath beginning this morning. Coronary artery disease.  EXAM: CHEST  2 VIEW  COMPARISON:  06/11/2015  FINDINGS: The heart size and mediastinal contours are within normal limits. Surgical clips seen at the thoracic inlet consistent with prior thyroidectomy.  Mild scarring noted in left lung base. No evidence of pulmonary infiltrate or edema. No evidence of pleural effusion.  IMPRESSION: Mild left basilar scarring.  No active disease.   Electronically Signed   By: Earle Gell M.D.   On: 09/26/2015 13:34   I have personally reviewed and evaluated these images and lab results as part of my medical decision-making.   EKG Interpretation   Date/Time:  Monday September 26 2015 12:33:06 EDT Ventricular Rate:  67 PR Interval:  162 QRS Duration: 86 QT Interval:  418 QTC Calculation: 441 R Axis:   48 Text Interpretation:  Normal sinus rhythm Low voltage QRS Nonspecific ST  abnormality Abnormal ECG Sinus rhythm ST-t wave abnormality Low voltage  QRS No significant change since last tracing Abnormal ekg Confirmed by  [name]  MD [phone]) on 09/26/2015 12:45:16 PM      MDM   Final diagnoses:  Tingling in extremities    Patient presents today with numbness/tingling on the bottom of her feet.  No CP, SOB, slurred speech, unilateral weakness.  VSS, NAD, appears non-toxic.  On exam, no focal neurological deficits.  Sensation intact bilaterally.  Strength intact bilaterally.  Doubt  stroke.  Troponin negative, EKG unchanged from previous, no  CP.  Doubt cardiac etiology.  BMP unremarkable.  CBC shows mild leukocytosis.  CXR shows no active disease.  Doubt PNA.  No urinary symptoms, doubt UTI.  Follow up with PCP this week.  Discussed return precautions.  Patient agrees and acknowledges the above plan.  Patient stable for discharge.  Case has been discussed with Dr. Alvino Chapel who agrees with the above plan for discharge.     Gloriann Loan, PA-C 09/26/15 [place], MD 09/28/15 [phone]

## 2015-09-26 NOTE — Discharge Instructions (Signed)

## 2015-09-27 ENCOUNTER — Telehealth: Payer: Self-pay | Admitting: Internal Medicine

## 2015-09-27 NOTE — Telephone Encounter (Signed)
Patient went to ED yesterday with weakness and tingling/numbness bottom of both feet. She held her zetia yesterday because she thinks that is the cause. She today she has no numbness or tingling but still weak. Advised her to continue to hold the zetia for today and tomorrow and I will call her on Thursday when Dr. Harrington Challenger is in clinic for update. Pt also takes Crestor but only skipped the Zetia.

## 2015-09-27 NOTE — Telephone Encounter (Signed)
New Message       Pt calling to speak to nurse about her medications. Please call back.

## 2015-10-18 ENCOUNTER — Telehealth: Payer: Self-pay | Admitting: Internal Medicine

## 2015-10-18 NOTE — Telephone Encounter (Signed)
New message     Request for surgical clearance:  1. What type of surgery is being performed? Back and neck injection   2. When is this surgery scheduled? 11.9.2016   3. Are there any medications that need to be held prior to surgery and how long? plavix - 5 days   4. Name of physician performing surgery? Dr. Nelva Bush   5. What is your office phone and fax number? [phone] / ext [phone]. 6. fax [phone]

## 2015-10-18 NOTE — Telephone Encounter (Signed)
Will forward to Dr. Harrington Challenger for advisement.

## 2015-10-25 NOTE — Telephone Encounter (Signed)
OK to hold plavix 5 days prior    

## 2015-10-25 NOTE — Telephone Encounter (Signed)
PRINTED AND PLACED IN NURSE FAX BIN IN MEDICAL RECORDS TO BE FAXED.

## 2015-10-26 ENCOUNTER — Ambulatory Visit (INDEPENDENT_AMBULATORY_CARE_PROVIDER_SITE_OTHER): Payer: PPO | Admitting: Neurology

## 2015-10-26 ENCOUNTER — Encounter: Payer: Self-pay | Admitting: Neurology

## 2015-10-26 VITALS — BP 144/72 | HR 65 | Ht 64.0 in | Wt 193.0 lb

## 2015-10-26 DIAGNOSIS — M545 Low back pain, unspecified: Secondary | ICD-10-CM | POA: Insufficient documentation

## 2015-10-26 DIAGNOSIS — M25551 Pain in right hip: Secondary | ICD-10-CM | POA: Insufficient documentation

## 2015-10-26 DIAGNOSIS — G8929 Other chronic pain: Secondary | ICD-10-CM | POA: Insufficient documentation

## 2015-10-26 DIAGNOSIS — M542 Cervicalgia: Secondary | ICD-10-CM

## 2015-10-26 NOTE — Progress Notes (Signed)
PATIENT: [name] DOB: [birth year]  Chief Complaint  Patient presents with  . Back Pain    Reports back pain has been getting worse over several years.  Pain radiates down into her right leg.  She has appointment next week for a repeat ESI with Dr. Nelva Bush.  Her last injection, three months ago, was not very helpful.     HISTORICAL  TKEYA STENCIL a 69 years old right-handed female, seen in refer by Dr. Suella Broad November third 2016 for evaluation of gait difficulty, low back pain,  She had a past medical history of hypertension, hyperlipidemia, coronary artery disease, status post stent, is taking aspirin and Plavix   I have reviewed and summarized her referring note, she was seen by [place] in the past few months, for chronic low back pain, chronic neck pain, multiple body part pain,  Per record, she had MRI of cervical spine in July 05 2015 at [facility] orthopedic clinic, which showed cervical degenerative disc disease, she also complains of pain in her neck, going down to both arms, also complains of low back pain, intermittently going to her legs,  She also reported that she was evaluated by neurosurgeon Dr. Sherley Bounds in August 2016, did suggest ACDF with plating at C4-5, C5 and 6  In October 2016 she woke up one morning, noticed dense numbness at right lateral leg, numbness from waist down, lasted 2-3 hours, she could not bear weight, has to lie down and resting, eventually symptoms gradually resolved, she went to the emergency room September 26 2015, laboratory showed normal CMP CBC,negative troponin.  Over the past 1 year, she had multiple epidural injection by Dr. Nelva Bush, which did help her symptoms, last injection was in June 2016, will have repeat injection in November ninth 2016, she now complains severe right-sided low back pain, radiating pain to right lateral leg, right hip pain, difficulty bearing weight, difficulty walking  I have  personally reviewed MRI lumbar in July 05 2015, L4-5, near-complete resolution of the large disc extrusion since previous study of May 2014, a minimum residual right posterolateral cranial disc extrusion which contact but does not display the right L5 root, interval development of mild to moderate diffuse degenerative disc height loss.  REVIEW OF SYSTEMS: Full 14 system review of systems performed and notable only for as above:  ALLERGIES: No Known Allergies  HOME MEDICATIONS: Current Outpatient Prescriptions  Medication Sig Dispense Refill  . amLODipine (NORVASC) 10 MG tablet Take 1 tablet (10 mg total) by mouth daily. 90 tablet 1  . aspirin EC 81 MG tablet Take 81 mg by mouth daily.      . B Complex-C (B-COMPLEX WITH VITAMIN C) tablet Take 1 tablet by mouth daily.    . calcitRIOL (ROCALTROL) 0.25 MCG capsule Take 3 capsules (0.75 mcg total) by mouth daily. Take 3 cap daily 270 capsule 1  . calcium carbonate (TUMS - DOSED IN MG ELEMENTAL CALCIUM) 500 MG chewable tablet Chew 4 tablets by mouth 2 (two) times daily.     . cholecalciferol (VITAMIN D) 1000 UNITS tablet Take 1,000 Units by mouth daily.    . clopidogrel (PLAVIX) 75 MG tablet Take 1 tablet (75 mg total) by mouth daily. 90 tablet 1  . diazepam (VALIUM) 5 MG tablet Take 5 mg by mouth at bedtime as needed. (SLEEP)  0  . ezetimibe (ZETIA) 10 MG tablet Take 1 tablet (10 mg total) by mouth daily. 90 tablet 3  . famotidine (  PEPCID) 20 MG tablet Take 1 tablet by mouth two  times daily 180 tablet 1  . HYDROcodone-acetaminophen (NORCO) 10-325 MG per tablet Take 1 tablet by mouth every 4 (four) hours as needed. (PAIN)  0  . isosorbide mononitrate (IMDUR) 60 MG 24 hr tablet Take 1 1/2 tablets by mouth daily - total of 90mg . 135 tablet 3  . levothyroxine (SYNTHROID, LEVOTHROID) 112 MCG tablet Take 1 tablet (112 mcg total) by mouth daily. 90 tablet 3  . Menthol, Topical Analgesic, (ICY HOT EX) Apply 1 patch topically daily.    . nitroGLYCERIN  (NITROSTAT) 0.4 MG SL tablet Dissolve 1 tablet under the tongue every 5 minutes as   needed for chest pain as   directed (Patient taking differently: Place 0.4 mg under the tongue every 5 (five) minutes as needed for chest pain. ) 25 tablet 2  . rosuvastatin (CRESTOR) 20 MG tablet Take 1 tablet (20 mg total) by mouth daily. 90 tablet 1  . zoster vaccine live, PF, (ZOSTAVAX) 35329 UNT/0.65ML injection Inject 19,400 Units into the skin once. 1 each 0   No current facility-administered medications for this visit.    PAST MEDICAL HISTORY: Past Medical History  Diagnosis Date  . GERD (gastroesophageal reflux disease)     occasional  . Hyperlipidemia   . Hypertension     Echocardiogram 11/19/11: Difficult acoustic windows, EF 60-65%, normal LV wall thickness, grade 1 diastolic dysfunction  . Vertigo   . Tubular adenoma of colon 06/2011  . Colitis   . Multinodular thyroid     Goiter s/p thyroidectomy in 2007 with post-op  hypocalcemia and post-op hypothyroidism/hypoparathyroidism with hypocalcemia  . CAD (coronary artery disease)     a) NSTEMI 10/2011: LHC 11/19/11: pLAD 30%, oOM 60%, AVCFX 30%, CFX after OM2 30%, pRCA 60 and 70%, then 99%, AM 80-90% with TIMI 3 flow.  PCI: Promus DES x 2 to RCA.  Marland Kitchen Post-surgical hypothyroidism   . Hypoparathyroidism ([place])   . Palpitations     a) 03/2012 - patient set up for event monitor but did not wear correctly -  she declined wearing a repeat monitor  . Unspecified constipation 05/31/2013  . Back pain 05/31/2013  . Nocturia 05/31/2013  . Neck pain on right side 07/13/2013  . MI (myocardial infarction) [facility])     november 2013  . Sun-damaged skin 12/05/2014  . Medicare annual wellness visit, subsequent 03/13/2015  . Preventative health care 03/13/2015    PAST SURGICAL HISTORY: Past Surgical History  Procedure Laterality Date  . Colonoscopy  Aenomatous polyps    07/05/2011  . Total thyroidectomy  2007    GOITER  . Shoulder arthroscopy w/ rotator cuff  repair      right  . Coronary angioplasty with stent placement    . Colonoscopy N/A 09/28/2014    Procedure: COLONOSCOPY;  Surgeon: Ladene Artist, MD;  Location: [facility] ENDOSCOPY;  Service: Endoscopy;  Laterality: N/A;  . Percutaneous coronary stent intervention (pci-s) N/A 11/19/2011    Procedure: PERCUTANEOUS CORONARY STENT INTERVENTION (PCI-S);  Surgeon: Hillary Bow, MD;  Location: [facility] CATH LAB;  Service: Cardiovascular;  Laterality: N/A;  . Left heart catheterization with coronary angiogram N/A 07/17/2012    Procedure: LEFT HEART CATHETERIZATION WITH CORONARY ANGIOGRAM;  Surgeon: Hillary Bow, MD;  Location: [facility] C[facility] LAB;  Service: Cardiovascular;  Laterality: N/A;    FAMILY HISTORY: Family History  Problem Relation Age of Onset  . Colon cancer Brother   . Cancer Brother  COLON  . Hypertension Father   . Heart disease Father   . Diabetes Neg Hx   . Prostate cancer Neg Hx   . Breast cancer Neg Hx   . Blindness Sister   . Congestive Heart Failure Sister   . Hypertension Sister   . Thyroid disease Sister   . Cancer Brother     multiple myelomas    SOCIAL HISTORY:  Social History   Social History  . Marital Status: Married    Spouse Name: N/A  . Number of Children: 4  . Years of Education: 14   Occupational History  . 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 Smoking status: Never Smoker   . Smokeless tobacco: Never Used  . Alcohol Use: No  . Drug Use: No  . Sexual Activity: Not on file   Other Topics Concern  . Not on file   Social History Narrative   Lives at home alone.  Her son lives near her.   Right-handed.   1 cup coffee per day.     PHYSICAL EXAM   Filed Vitals:   10/26/15 1038  BP: 144/72  Pulse: 65  Height: 5\' 4"  (1.626 m)  Weight: 193 lb (87.544 kg)    Not recorded      Body mass index is 33.11 kg/(m^2).  PHYSICAL EXAMNIATION:  Gen: NAD, conversant, well nourised, obese, well groomed                       Cardiovascular: Regular rate rhythm, no peripheral edema, warm, nontender. Eyes: Conjunctivae clear without exudates or hemorrhage Neck: Supple, no carotid bruise. Pulmonary: Clear to auscultation bilaterally   NEUROLOGICAL EXAM:  MENTAL STATUS: Speech:    Speech is normal; fluent and spontaneous with normal comprehension.  Cognition:     Orientation to time, place and person     Normal recent and remote memory     Normal Attention span and concentration     Normal Language, naming, repeating,spontaneous speech     Fund of knowledge   CRANIAL NERVES: CN II: Visual fields are full to confrontation. Fundoscopic exam is normal with sharp discs and no vascular changes. Pupils are round equal and briskly reactive to light. CN III, IV, VI: extraocular movement are normal. No ptosis. CN V: Facial sensation is intact to pinprick in all 3 divisions bilaterally. Corneal responses are intact.  CN VII: Face is symmetric with normal eye closure and smile. CN VIII: Hearing is normal to rubbing fingers CN IX, X: Palate elevates symmetrically. Phonation is normal. CN XI: Head turning and shoulder shrug are intact CN XII: Tongue is midline with normal movements and no atrophy.  MOTOR: There is no pronator drift of out-stretched arms. Muscle bulk and tone are normal. Muscle strength is normal.  REFLEXES: Reflexes are 2+ and symmetric at the biceps, triceps, knees, and ankles. Plantar responses are flexor.  SENSORY: Intact to light touch, pinprick, position sense, and vibration sense are intact in fingers and toes.  COORDINATION: Rapid alternating movements and fine finger movements are intact. There is no dysmetria on finger-to-nose and heel-knee-shin.    GAIT/STANCE: Antalgic, limb, dragging right leg, complains of right-sided low back pain, right hip pain with right cross leg exam   DIAGNOSTIC DATA (LABS, IMAGING, TESTING) - I reviewed patient records, labs, notes, testing and imaging  myself where available.  ASSESSMENT AND PLAN  Nathaly CONLEIGH HEINLEIN is a 69 y.o. female  Right hip pain  X -ray of right hip  Gait difficulty  Multifactorial, her complains of right hip pain does contribute,  Possible lumbar radiculopathy, proceed with EMG nerve conduction study  Continue physical therapy, moderate exercise  Get MRI of cervical reported and disc from Dr. Nelva Bush clinic  Marcial Pacas, M.D. Ph.D.  [facility] [address] [place], [place] [address] Ph: [phone] Fax: [phone]  CC: Suella Broad, MD

## 2015-10-27 ENCOUNTER — Telehealth: Payer: Self-pay | Admitting: Neurology

## 2015-10-27 ENCOUNTER — Ambulatory Visit
Admission: RE | Admit: 2015-10-27 | Discharge: 2015-10-27 | Disposition: A | Discharge status: Home / Self Care | Payer: Medicaid Other | Source: Ambulatory Visit | Attending: Neurology | Admitting: Neurology

## 2015-10-27 DIAGNOSIS — R937 Abnormal findings on diagnostic imaging of other parts of musculoskeletal system: Principal | ICD-10-CM

## 2015-10-27 DIAGNOSIS — R936 Abnormal findings on diagnostic imaging of limbs: Secondary | ICD-10-CM

## 2015-10-27 DIAGNOSIS — M25551 Pain in right hip: Secondary | ICD-10-CM

## 2015-10-27 DIAGNOSIS — M542 Cervicalgia: Secondary | ICD-10-CM

## 2015-10-27 DIAGNOSIS — G8929 Other chronic pain: Secondary | ICD-10-CM

## 2015-10-27 DIAGNOSIS — M545 Low back pain: Secondary | ICD-10-CM

## 2015-10-27 NOTE — Telephone Encounter (Signed)
Attempted call again - left message.

## 2015-10-27 NOTE — Telephone Encounter (Signed)
Left message to return my call.  

## 2015-10-27 NOTE — Telephone Encounter (Signed)
Please call patient, x-ray of right hip showed right proximal femur sclerotic density, this might represent a bone island, or active blastic focus, radiology has suggested whole-body bone scan with attention to the right hip, I have forwarded the result to her primary care Taylor Lukes, MD, above has is usually coordinated by primary care physician.    1. A sclerotic density is noted in the right proximal femur in the intertrochanteric region. This may represent a bone island. An active blastic focus including metastatic disease cannot be excluded and whole-body bone scan with attention to the right hip suggested. 2. Degenerative changes lumbar spine and both hips. No acute bony abnormality . 3. Aortoiliac atherosclerotic vascular disease.

## 2015-10-31 NOTE — Telephone Encounter (Addendum)
Spoke to patient - she is agreeable to bone scan.   Order placed. whole-body bone scan with attention to the right hip. Marcial Pacas, M.D. Ph.D.  [facility] [place], [place] [address] Phone: [phone] Fax:      [phone]

## 2015-10-31 NOTE — Addendum Note (Signed)
Addended by: Marcial Pacas on: 10/31/2015 11:26 AM   Modules accepted: Orders

## 2015-11-01 NOTE — Telephone Encounter (Signed)
Patient aware that her paperwork has been faxed to her other physicians.  She would like a sedative for her bone scan.  Ok per Dr. Krista Blue to provide Xanax, per office protocol.  The patient stopped by the office and was provided the medication.

## 2015-11-01 NOTE — Telephone Encounter (Signed)
I have faxed the Nov 2nd 2016 office note to the provided listed, including Dr. Nelva Bush, Ananias Pilgrim, MD PCP - General Obstetrics and Gynecology 06/21/2011 06/04/2012 03/03/15  Phone: [phone]; Fax: [phone]   Comment: Merged  Mosie Lukes, MD PCP - General Family Medicine 05/28/2013 End 03/03/15  Phone: [phone]; Fax: [phone]   Comment: Merged  Burnice Logan, MD PCP - General Internal Medicine 06/05/2012 05/27/2013 03/03/15  Comment: Merged  Other Patient Care Team Members Ladene Artist, MD Consulting Physician Gastroenterology 03/13/2015 End 03/13/15  Phone: [phone]; Fax: [phone]  Fay Records, MD Consulting Physician Cardiology 03/13/2015 End 03/13/15  Phone: [phone]; Fax: [phone]  Suella Broad, MD

## 2015-11-01 NOTE — Telephone Encounter (Signed)
Patient called to inquire if records were sent to Dr. Nelva Bush regarding bone scan. Patient states she is on LTD and they are wanting to cancel her insurance unless Dr. Nelva Bush gets records. Request that note be added and sent that she can't stand on her feet because of hip. Request that records be sent to Dr. Nelva Bush today.

## 2015-11-08 ENCOUNTER — Telehealth: Payer: Self-pay | Admitting: Neurology

## 2015-11-08 NOTE — Telephone Encounter (Signed)
After research. Patient's PCP will have to order whole Body Scan because patient has medicaid. I have called and spoke to patient relayed to Call PCP . Patient understood.

## 2015-11-08 NOTE — Telephone Encounter (Signed)
Patient called wanting to follow up on her bone density that Dr. Krista Blue ordered from 10/31/15. I saw your notes where you faxed the order but I was not sure where [facility] is or their phone# I only see fax#. Can you please call the patient. She can be reached @ [phone]. Thank you!

## 2015-11-08 NOTE — Telephone Encounter (Signed)
Reached out to [place] at [place] she relayed because its been more than three years there office policy they are accepting any more new medicaid . I will get patient scheduled at another office for her. I will call patient with details.

## 2015-11-08 NOTE — Telephone Encounter (Signed)
Called patient and left her a detailed message about her Bone Scan still working with   [place] to get her scheduled.

## 2015-11-14 ENCOUNTER — Telehealth: Payer: Self-pay | Admitting: Family Medicine

## 2015-11-14 ENCOUNTER — Other Ambulatory Visit (INDEPENDENT_AMBULATORY_CARE_PROVIDER_SITE_OTHER): Payer: PPO | Admitting: *Deleted

## 2015-11-14 DIAGNOSIS — E785 Hyperlipidemia, unspecified: Secondary | ICD-10-CM

## 2015-11-14 LAB — LIPID PANEL
CHOLESTEROL: 194 mg/dL (ref 125–200)
HDL: 52 mg/dL (ref >=46)
LDL Cholesterol: 97 mg/dL (ref <130)
Total CHOL/HDL Ratio: 3.7 Ratio (ref <=5.0)
Triglycerides: 225 mg/dL — ABNORMAL HIGH (ref <150)
VLDL: 45 mg/dL — ABNORMAL HIGH (ref <30)

## 2015-11-14 LAB — AST: AST: 16 U/L (ref 10–35)

## 2015-11-14 NOTE — Telephone Encounter (Signed)
°  Relation to [id number] Call back number:[phone] Pharmacy:[facility]  Reason for call: pt states that Dr. Krista Blue sent dr. Charlett Blake information for her to put in a order for the pt to have a full body scan, pt would like to know when dr. Charlett Blake will put the order in, pt also states she will need something to relax her when she has this done. Pt would like a call back with an update on the order.

## 2015-11-14 NOTE — Addendum Note (Signed)
Addended by: Eulis Foster on: 11/14/2015 09:01 AM   Modules accepted: Orders

## 2015-11-14 NOTE — Addendum Note (Signed)
Addended by: Eulis Foster on: 11/14/2015 09:02 AM   Modules accepted: Orders

## 2015-11-14 NOTE — Telephone Encounter (Signed)
I do not see enough information in chart to get a NM bone scan ordered and approved through insurance I will need a visit with her to justify this testing.

## 2015-11-15 ENCOUNTER — Other Ambulatory Visit: Payer: Self-pay | Admitting: *Deleted

## 2015-11-15 NOTE — Telephone Encounter (Signed)
Patient informed and she agreed to schedule appt.

## 2015-11-15 NOTE — Telephone Encounter (Signed)
Called left message to call back 

## 2015-11-15 NOTE — Telephone Encounter (Signed)
Pt is returning your call.    Please call back at: [phone]

## 2015-11-21 ENCOUNTER — Encounter (HOSPITAL_COMMUNITY): Payer: Self-pay

## 2015-11-23 ENCOUNTER — Telehealth: Payer: Self-pay | Admitting: Family Medicine

## 2015-11-23 ENCOUNTER — Ambulatory Visit: Payer: PPO | Admitting: Family Medicine

## 2015-11-23 ENCOUNTER — Encounter: Payer: Self-pay | Admitting: Family Medicine

## 2015-11-23 ENCOUNTER — Ambulatory Visit (INDEPENDENT_AMBULATORY_CARE_PROVIDER_SITE_OTHER): Payer: PPO | Admitting: Family Medicine

## 2015-11-23 VITALS — BP 120/50 | HR 76 | Temp 97.8°F | Ht 64.0 in | Wt 197.0 lb

## 2015-11-23 DIAGNOSIS — E785 Hyperlipidemia, unspecified: Secondary | ICD-10-CM

## 2015-11-23 DIAGNOSIS — R61 Generalized hyperhidrosis: Secondary | ICD-10-CM | POA: Diagnosis not present

## 2015-11-23 DIAGNOSIS — R001 Bradycardia, unspecified: Secondary | ICD-10-CM

## 2015-11-23 DIAGNOSIS — I10 Essential (primary) hypertension: Secondary | ICD-10-CM

## 2015-11-23 DIAGNOSIS — F411 Generalized anxiety disorder: Secondary | ICD-10-CM

## 2015-11-23 DIAGNOSIS — M25551 Pain in right hip: Secondary | ICD-10-CM

## 2015-11-23 DIAGNOSIS — M899 Disorder of bone, unspecified: Secondary | ICD-10-CM

## 2015-11-23 DIAGNOSIS — K219 Gastro-esophageal reflux disease without esophagitis: Secondary | ICD-10-CM

## 2015-11-23 DIAGNOSIS — E60 Dietary zinc deficiency: Secondary | ICD-10-CM

## 2015-11-23 DIAGNOSIS — E039 Hypothyroidism, unspecified: Secondary | ICD-10-CM

## 2015-11-23 LAB — COMPREHENSIVE METABOLIC PANEL
ALT: 19 U/L (ref 0–35)
AST: 15 U/L (ref 0–37)
Albumin: 3.9 g/dL (ref 3.5–5.2)
Alkaline Phosphatase: 76 U/L (ref 39–117)
BUN: 18 mg/dL (ref 6–23)
CHLORIDE: 101 meq/L (ref 96–112)
CO2: 33 meq/L — AB (ref 19–32)
CREATININE: 1.05 mg/dL (ref 0.40–1.20)
Calcium: 8.6 mg/dL (ref 8.4–10.5)
GFR: 55.27 mL/min — ABNORMAL LOW (ref >60.00)
Glucose, Bld: 95 mg/dL (ref 70–99)
Potassium: 4.1 mEq/L (ref 3.5–5.1)
SODIUM: 143 meq/L (ref 135–145)
Total Bilirubin: 0.5 mg/dL (ref 0.2–1.2)
Total Protein: 7.5 g/dL (ref 6.0–8.3)

## 2015-11-23 LAB — CBC
HCT: 43.7 % (ref 36.0–46.0)
Hemoglobin: 14.4 g/dL (ref 12.0–15.0)
MCHC: 32.9 g/dL (ref 30.0–36.0)
MCV: 87.8 fl (ref 78.0–100.0)
Platelets: 248 10*3/uL (ref 150.0–400.0)
RBC: 4.98 Mil/uL (ref 3.87–5.11)
RDW: 16 % — AB (ref 11.5–15.5)
WBC: 10.1 10*3/uL (ref 4.0–10.5)

## 2015-11-23 LAB — TSH: TSH: 8 u[IU]/mL — ABNORMAL HIGH (ref 0.35–4.50)

## 2015-11-23 MED ORDER — LORAZEPAM 0.5 MG PO TABS: ORAL_TABLET | ORAL | Status: DC

## 2015-11-23 NOTE — Patient Instructions (Addendum)
Zinc 50 mg daily and [place] can order online [email or web address]  Hip Pain Your hip is the joint between your upper legs and your lower pelvis. The bones, cartilage, tendons, and muscles of your hip joint perform a lot of work each day supporting your body weight and allowing you to move around. Hip pain can range from a minor ache to severe pain in one or both of your hips. Pain may be felt on the inside of the hip joint near the groin, or the outside near the buttocks and upper thigh. You may have swelling or stiffness as well.  HOME CARE INSTRUCTIONS   Take medicines only as directed by your health care provider.  Apply ice to the injured area:  Put ice in a plastic bag.  Place a towel between your skin and the bag.  Leave the ice on for 15-20 minutes at a time, 3-4 times a day.  Keep your leg raised (elevated) when possible to lessen swelling.  Avoid activities that cause pain.  Follow specific exercises as directed by your health care provider.  Sleep with a pillow between your legs on your most comfortable side.  Record how often you have hip pain, the location of the pain, and what it feels like. SEEK MEDICAL CARE IF:   You are unable to put weight on your leg.  Your hip is red or swollen or very tender to touch.  Your pain or swelling continues or worsens after 1 week.  You have increasing difficulty walking.  You have a fever. SEEK IMMEDIATE MEDICAL CARE IF:   You have fallen.  You have a sudden increase in pain and swelling in your hip. MAKE SURE YOU:   Understand these instructions.  Will watch your condition.  Will get help right away if you are not doing well or get worse.   This information is not intended to replace advice given to you by your health care provider. Make sure you discuss any questions you have with your health care provider.   Document Released: 05/30/2010 Document Revised: 12/31/2014 Document Reviewed: 08/06/2013 Elsevier  Interactive Patient Education [employer].

## 2015-11-23 NOTE — Telephone Encounter (Signed)
Printed. Pending for signature.

## 2015-11-23 NOTE — Telephone Encounter (Signed)
OK to prescribe Lorazepam 0.5 mg tabs 1-2 tabs po once prn NM scan, disp #2 tabsr

## 2015-11-23 NOTE — Telephone Encounter (Signed)
Needs something to relax her for Nuc Med Scan, please call to [facility] on [address]

## 2015-11-23 NOTE — Progress Notes (Signed)
Pre visit review using our clinic review tool, if applicable. No additional management support is needed unless otherwise documented below in the visit note. 

## 2015-11-24 ENCOUNTER — Other Ambulatory Visit: Payer: Self-pay | Admitting: Family Medicine

## 2015-11-24 MED ORDER — LORAZEPAM 0.5 MG PO TABS: ORAL_TABLET | ORAL | Status: DC

## 2015-11-24 MED ORDER — LEVOTHYROXINE SODIUM 112 MCG PO TABS: 112.0000 ug | ORAL_TABLET | Freq: Every day | ORAL | Status: DC

## 2015-11-24 NOTE — Telephone Encounter (Signed)
Faxed hardcopy to [facility] in [place].

## 2015-11-24 NOTE — Addendum Note (Signed)
Addended by: Sharon Seller B on: 11/24/2015 09:11 AM   Modules accepted: Orders

## 2015-11-26 ENCOUNTER — Encounter: Payer: Self-pay | Admitting: Family Medicine

## 2015-11-26 DIAGNOSIS — E60 Dietary zinc deficiency: Secondary | ICD-10-CM

## 2015-11-26 HISTORY — DX: Dietary zinc deficiency: E60

## 2015-11-26 LAB — ZINC: ZINC: 55 ug/dL — AB (ref 60–130)

## 2015-11-26 NOTE — Assessment & Plan Note (Signed)
Well controlled, no changes to meds. Encouraged heart healthy diet such as the DASH diet and exercise as tolerated.  °

## 2015-11-26 NOTE — Assessment & Plan Note (Signed)
Start supplements 30 to 50 mg daily and recheck

## 2015-11-26 NOTE — Assessment & Plan Note (Signed)
Avoid offending foods, start probiotics. Do not eat large meals in late evening and consider raising head of bed.  

## 2015-11-26 NOTE — Progress Notes (Signed)
Subjective:    Patient ID: [name], female    DOB: [birth year], 69 y.o.   MRN: [id number]  Chief Complaint  Patient presents with  . Advice Only    discuss having a body scan    HPI Patient is in today for follow-up on recent imaging. Imaging of her right hip showed a possible bony lesion. Neurology and radiology have recommended that but she proceed with PET scanning. Pain is persistent. Changes with position changes. No falls or trauma. No incontinence. No other recent or acute illness. Denies CP/palp/SOB/HA/congestion/fevers/GI or GU c/o. Taking meds as prescribed  Past Medical History  Diagnosis Date  . GERD (gastroesophageal reflux disease)     occasional  . Hyperlipidemia   . Hypertension     Echocardiogram 11/19/11: Difficult acoustic windows, EF 60-65%, normal LV wall thickness, grade 1 diastolic dysfunction  . Vertigo   . Tubular adenoma of colon 06/2011  . Colitis   . Multinodular thyroid     Goiter s/p thyroidectomy in 2007 with post-op  hypocalcemia and post-op hypothyroidism/hypoparathyroidism with hypocalcemia  . CAD (coronary artery disease)     a) NSTEMI 10/2011: LHC 11/19/11: pLAD 30%, oOM 60%, AVCFX 30%, CFX after OM2 30%, pRCA 60 and 70%, then 99%, AM 80-90% with TIMI 3 flow.  PCI: Promus DES x 2 to RCA.  Marland Kitchen Post-surgical hypothyroidism   . Hypoparathyroidism ([place])   . Palpitations     a) 03/2012 - patient set up for event monitor but did not wear correctly -  she declined wearing a repeat monitor  . Unspecified constipation 05/31/2013  . Back pain 05/31/2013  . Nocturia 05/31/2013  . Neck pain on right side 07/13/2013  . MI (myocardial infarction) [facility])     november 2013  . Sun-damaged skin 12/05/2014  . Medicare annual wellness visit, subsequent 03/13/2015  . Preventative health care 03/13/2015  . Zinc deficiency 11/26/2015    Past Surgical History  Procedure Laterality Date  . Colonoscopy  Aenomatous polyps    07/05/2011  . Total thyroidectomy  2007      GOITER  . Shoulder arthroscopy w/ rotator cuff repair      right  . Coronary angioplasty with stent placement    . Colonoscopy N/A 09/28/2014    Procedure: COLONOSCOPY;  Surgeon: Ladene Artist, MD;  Location: [facility] ENDOSCOPY;  Service: Endoscopy;  Laterality: N/A;  . Percutaneous coronary stent intervention (pci-s) N/A 11/19/2011    Procedure: PERCUTANEOUS CORONARY STENT INTERVENTION (PCI-S);  Surgeon: Hillary Bow, MD;  Location[facility] C[facility] LAB;  Service: Cardiovascular;  Laterality: N/A;  . Left heart catheterization with coronary angiogram N/A 07/17/2012    Procedure: LEFT HEART CATHETERIZATION WITH CORONARY ANGIOGRAM;  Surgeon: Hillary Bow, MD;  Location: [facility] CATH LAB;  Service: Cardiovascular;  Laterality: N/A;    Family History  Problem Relation Age of Onset  . Colon cancer Brother   . Cancer Brother     COLON  . Hypertension Father   . Heart disease Father   . Diabetes Neg Hx   . Prostate cancer Neg Hx   . Breast cancer Neg Hx   . Blindness Sister   . Congestive Heart Failure Sister   . Hypertension Sister   . Thyroid disease Sister   . Cancer Brother     multiple myelomas    Social History   Social History  . Marital Status: Married    Spouse Name: N/A  . Number of Children: 4  . Years of  Education: 14   Occupational History  . 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 Smoking status: Never Smoker   . Smokeless tobacco: Never Used  . Alcohol Use: No  . Drug Use: No  . Sexual Activity: Not on file   Other Topics Concern  . Not on file   Social History Narrative   Lives at home alone.  Her son lives near her.   Right-handed.   1 cup coffee per day.    Outpatient Prescriptions Prior to Visit  Medication Sig Dispense Refill  . amLODipine (NORVASC) 10 MG tablet Take 1 tablet (10 mg total) by mouth daily. 90 tablet 1  . aspirin EC 81 MG tablet Take 81 mg by mouth daily.      . B Complex-C (B-COMPLEX WITH VITAMIN C) tablet Take 1 tablet  by mouth daily.    . calcitRIOL (ROCALTROL) 0.25 MCG capsule Take 3 capsules (0.75 mcg total) by mouth daily. Take 3 cap daily 270 capsule 1  . calcium carbonate (TUMS - DOSED IN MG ELEMENTAL CALCIUM) 500 MG chewable tablet Chew 4 tablets by mouth 2 (two) times daily.     . cholecalciferol (VITAMIN D) 1000 UNITS tablet Take 1,000 Units by mouth daily.    . clopidogrel (PLAVIX) 75 MG tablet Take 1 tablet (75 mg total) by mouth daily. 90 tablet 1  . diazepam (VALIUM) 5 MG tablet Take 5 mg by mouth at bedtime as needed. (SLEEP)  0  . famotidine (PEPCID) 20 MG tablet Take 1 tablet by mouth two  times daily 180 tablet 1  . HYDROcodone-acetaminophen (NORCO) 10-325 MG per tablet Take 1 tablet by mouth every 4 (four) hours as needed. (PAIN)  0  . isosorbide mononitrate (IMDUR) 60 MG 24 hr tablet Take 1 1/2 tablets by mouth daily - total of $Remove'90mg'NsGDBEU$ . 135 tablet 3  . Menthol, Topical Analgesic, (ICY HOT EX) Apply 1 patch topically daily.    . nitroGLYCERIN (NITROSTAT) 0.4 MG SL tablet Dissolve 1 tablet under the tongue every 5 minutes as   needed for chest pain as   directed (Patient taking differently: Place 0.4 mg under the tongue every 5 (five) minutes as needed for chest pain. ) 25 tablet 2  . rosuvastatin (CRESTOR) 20 MG tablet Take 1 tablet (20 mg total) by mouth daily. 90 tablet 1  . levothyroxine (SYNTHROID, LEVOTHROID) 112 MCG tablet Take 1 tablet (112 mcg total) by mouth daily. 90 tablet 3  . zoster vaccine live, PF, (ZOSTAVAX) 16109 UNT/0.65ML injection Inject 19,400 Units into the skin once. 1 each 0   No facility-administered medications prior to visit.    Allergies  Allergen Reactions  . Zetia [Ezetimibe] Other (See Comments)    Myalgia, paresthesias and weakness    Review of Systems  Constitutional: Positive for malaise/fatigue. Negative for fever.  HENT: Negative for congestion.   Eyes: Negative for blurred vision and discharge.  Respiratory: Negative for shortness of breath.     Cardiovascular: Negative for chest pain, palpitations and leg swelling.  Gastrointestinal: Negative for nausea and abdominal pain.  Genitourinary: Negative for dysuria.  Musculoskeletal: Positive for joint pain and falls.  Skin: Negative for rash.  Neurological: Negative for loss of consciousness and headaches.  Endo/Heme/Allergies: Negative for environmental allergies.  Psychiatric/Behavioral: Negative for depression. The patient is not nervous/anxious.        Objective:    Physical Exam  Constitutional: She is oriented to person, place, and time. She appears well-developed  and well-nourished. No distress.  HENT:  Head: Normocephalic and atraumatic.  Nose: Nose normal.  Eyes: Right eye exhibits no discharge. Left eye exhibits no discharge.  Neck: Normal range of motion. Neck supple.  Cardiovascular: Normal rate and regular rhythm.   No murmur heard. Pulmonary/Chest: Effort normal and breath sounds normal.  Abdominal: Soft. Bowel sounds are normal. There is no tenderness.  Musculoskeletal: She exhibits no edema.  Neurological: She is alert and oriented to person, place, and time.  Skin: Skin is warm and dry.  Psychiatric: She has a normal mood and affect.  Nursing note and vitals reviewed.   BP 120/50 mmHg  Pulse 76  Temp(Src) 97.8 F (36.6 C) (Oral)  Ht _0  (1.626 m)  Wt 197 lb (89.359 kg)  BMI 33.80 kg/m2  SpO2 98% Wt Readings from Last 3 Encounters:  11/23/15 197 lb (89.359 kg)  10/26/15 193 lb (87.544 kg)  09/12/15 192 lb 4 oz (87.204 kg)     Lab Results  Component Value Date   WBC 10.1 11/23/2015   HGB 14.4 11/23/2015   HCT 43.7 11/23/2015   PLT 248.0 11/23/2015   GLUCOSE 95 11/23/2015   CHOL 194 11/14/2015   TRIG 225* 11/14/2015   HDL 52 11/14/2015   LDLDIRECT 83.8 08/11/2012   LDLCALC 97 11/14/2015   ALT 19 11/23/2015   AST 15 11/23/2015   NA 143 11/23/2015   K 4.1 11/23/2015   CL 101 11/23/2015   CREATININE 1.05 11/23/2015   BUN 18  11/23/2015   CO2 33* 11/23/2015   TSH 8.00* 11/23/2015   INR 0.94 12/02/2014    Lab Results  Component Value Date   TSH 8.00* 11/23/2015   Lab Results  Component Value Date   WBC 10.1 11/23/2015   HGB 14.4 11/23/2015   HCT 43.7 11/23/2015   MCV 87.8 11/23/2015   PLT 248.0 11/23/2015   Lab Results  Component Value Date   NA 143 11/23/2015   K 4.1 11/23/2015   CO2 33* 11/23/2015   GLUCOSE 95 11/23/2015   BUN 18 11/23/2015   CREATININE 1.05 11/23/2015   BILITOT 0.5 11/23/2015   ALKPHOS 76 11/23/2015   AST 15 11/23/2015   ALT 19 11/23/2015   PROT 7.5 11/23/2015   ALBUMIN 3.9 11/23/2015   CALCIUM 8.6 11/23/2015   ANIONGAP 12 09/26/2015   GFR 55.27* 11/23/2015   Lab Results  Component Value Date   CHOL 194 11/14/2015   Lab Results  Component Value Date   HDL 52 11/14/2015   Lab Results  Component Value Date   LDLCALC 97 11/14/2015   Lab Results  Component Value Date   TRIG 225* 11/14/2015   Lab Results  Component Value Date   CHOLHDL 3.7 11/14/2015   No results found for: HGBA1C     Assessment & Plan:   Problem List Items Addressed This Visit    Bradycardia    RRR today      Essential hypertension, benign    Well controlled, no changes to meds. Encouraged heart healthy diet such as the DASH diet and exercise as tolerated.       GERD (gastroesophageal reflux disease)    Avoid offending foods, start probiotics. Do not eat large meals in late evening and consider raising head of bed.       Hyperlipidemia    Tolerating statin, encouraged heart healthy diet, avoid trans fats, minimize simple carbs and saturated fats. Increase exercise as tolerated      Hypothyroidism  On Levothyroxine, continue to monitor      Right hip pain - Primary    Abnormality on xray. Ordered a PET scan at recommendation of radiology      Relevant Orders   NM PET Image Initial (PI) Skull Base To Thigh   TSH (Completed)   CBC (Completed)   Comp Met (CMET)  (Completed)   Zinc (Completed)   Zinc deficiency    Start supplements 30 to 50 mg daily and recheck       Other Visit Diagnoses    Bone lesion        Relevant Orders    NM PET Image Initial (PI) Skull Base To Thigh    TSH (Completed)    CBC (Completed)    Comp Met (CMET) (Completed)    Zinc (Completed)    Diaphoresis        Relevant Orders    NM PET Image Initial (PI) Skull Base To Thigh    TSH (Completed)    CBC (Completed)    Comp Met (CMET) (Completed)    Zinc (Completed)    Essential hypertension        Relevant Orders    NM PET Image Initial (PI) Skull Base To Thigh    TSH (Completed)    CBC (Completed)    Comp Met (CMET) (Completed)    Zinc (Completed)       I have discontinued Ms. Antone's zoster vaccine live (PF). I am also having her maintain her calcium carbonate, aspirin EC, nitroGLYCERIN, B-complex with vitamin C, cholecalciferol, (Menthol, Topical Analgesic, (ICY HOT EX)), isosorbide mononitrate, diazepam, HYDROcodone-acetaminophen, amLODipine, calcitRIOL, clopidogrel, famotidine, and rosuvastatin.  No orders of the defined types were placed in this encounter.     Penni Homans, MD

## 2015-11-26 NOTE — Assessment & Plan Note (Signed)
On Levothyroxine, continue to monitor 

## 2015-11-26 NOTE — Assessment & Plan Note (Signed)
Tolerating statin, encouraged heart healthy diet, avoid trans fats, minimize simple carbs and saturated fats. Increase exercise as tolerated 

## 2015-11-26 NOTE — Assessment & Plan Note (Signed)
RRR today 

## 2015-11-26 NOTE — Assessment & Plan Note (Signed)
Abnormality on xray. Ordered a PET scan at recommendation of radiology

## 2015-11-28 ENCOUNTER — Ambulatory Visit (HOSPITAL_COMMUNITY): Payer: Medicaid Other

## 2015-11-30 ENCOUNTER — Telehealth: Payer: Self-pay | Admitting: Family Medicine

## 2015-11-30 ENCOUNTER — Other Ambulatory Visit: Payer: Self-pay | Admitting: Family Medicine

## 2015-11-30 DIAGNOSIS — M25552 Pain in left hip: Secondary | ICD-10-CM

## 2015-11-30 NOTE — Telephone Encounter (Signed)
Caller name: [place] Can be reached: [phone]  Reason for call: Order is in for PET scan for Friday. Per notes pt radiology recommendation should be having a bone scan. They need updated orders in the system by tomorrow as pt is supposed to come in Friday 12/02/15.

## 2015-11-30 NOTE — Telephone Encounter (Signed)
Please advise 

## 2015-11-30 NOTE — Telephone Encounter (Signed)
I ordered a bone scan I thought it was what I ordered before so I am not confident I ordered the correct test. Have them tell us what they specifically need if this is incorrect

## 2015-12-01 NOTE — Telephone Encounter (Signed)
PCP discussed with Nuclear med to discuss correct test to be done.

## 2015-12-02 ENCOUNTER — Encounter (HOSPITAL_COMMUNITY)
Admission: RE | Admit: 2015-12-02 | Discharge: 2015-12-02 | Disposition: A | Discharge status: Home / Self Care | Payer: PPO | Source: Ambulatory Visit | Attending: Family Medicine | Admitting: Family Medicine

## 2015-12-02 ENCOUNTER — Encounter (HOSPITAL_COMMUNITY): Payer: Medicaid Other

## 2015-12-02 DIAGNOSIS — M25552 Pain in left hip: Secondary | ICD-10-CM | POA: Insufficient documentation

## 2015-12-02 DIAGNOSIS — M899 Disorder of bone, unspecified: Secondary | ICD-10-CM | POA: Insufficient documentation

## 2015-12-02 MED ORDER — TECHNETIUM TC 99M MEDRONATE IV KIT
25.6000 | PACK | Freq: Once | INTRAVENOUS | Status: AC | PRN
Start: 2015-12-02 — End: 2015-12-02
  Administered 2015-12-02: 25.6 via INTRAVENOUS

## 2015-12-08 ENCOUNTER — Encounter (INDEPENDENT_AMBULATORY_CARE_PROVIDER_SITE_OTHER): Payer: Self-pay | Admitting: Neurology

## 2015-12-08 ENCOUNTER — Ambulatory Visit (INDEPENDENT_AMBULATORY_CARE_PROVIDER_SITE_OTHER): Payer: PPO | Admitting: Neurology

## 2015-12-08 DIAGNOSIS — Z0289 Encounter for other administrative examinations: Secondary | ICD-10-CM

## 2015-12-08 DIAGNOSIS — G8929 Other chronic pain: Secondary | ICD-10-CM

## 2015-12-08 DIAGNOSIS — M545 Low back pain: Secondary | ICD-10-CM

## 2015-12-08 DIAGNOSIS — M25551 Pain in right hip: Secondary | ICD-10-CM | POA: Diagnosis not present

## 2015-12-08 DIAGNOSIS — M542 Cervicalgia: Secondary | ICD-10-CM

## 2015-12-08 NOTE — Procedures (Signed)
   NCS (NERVE CONDUCTION STUDY) WITH EMG (ELECTROMYOGRAPHY) REPORT   STUDY DATE: December 08 2015 PATIENT NAME: [name] DOB: 03-12-1946 MRN: [id number]    TECHNOLOGIST: Laretta Alstrom ELECTROMYOGRAPHER: Marcial Pacas M.D.  CLINICAL INFORMATION:  69 years old female, with chronic neck and low back pain, radiating pain to right lower extremity  FINDINGS: NERVE CONDUCTION STUDY: She refused nerve conduction study, could not tolerate it.  NEEDLE ELECTROMYOGRAPHY: Selected needle examination was performed at right lower extremity muscles and right lumbosacral paraspinal muscles.  Needle examination of right tibialis anterior, peroneal longus, medial gastrocnemius, vastus lateralis was normal.  There was no spontaneous or right lumbosacral paraspinal muscles, right L4-L5.  IMPRESSION:  This is a normal study. There is no electrodiagnostic evidence of large fiber peripheral neuropathy or right lumbosacral radiculopathy.   INTERPRETING PHYSICIAN:   Marcial Pacas M.D. Ph.D. [facility] [address], [place],  [address] [phone]

## 2016-01-02 DIAGNOSIS — M5416 Radiculopathy, lumbar region: Secondary | ICD-10-CM | POA: Diagnosis not present

## 2016-01-02 DIAGNOSIS — M5090 Cervical disc disorder, unspecified, unspecified cervical region: Secondary | ICD-10-CM | POA: Diagnosis not present

## 2016-01-02 DIAGNOSIS — M542 Cervicalgia: Secondary | ICD-10-CM | POA: Diagnosis not present

## 2016-01-02 DIAGNOSIS — M5136 Other intervertebral disc degeneration, lumbar region: Secondary | ICD-10-CM | POA: Diagnosis not present

## 2016-01-23 ENCOUNTER — Ambulatory Visit (INDEPENDENT_AMBULATORY_CARE_PROVIDER_SITE_OTHER): Payer: Medicare Other | Admitting: Internal Medicine

## 2016-01-23 ENCOUNTER — Encounter: Payer: Self-pay | Admitting: Internal Medicine

## 2016-01-23 ENCOUNTER — Encounter (INDEPENDENT_AMBULATORY_CARE_PROVIDER_SITE_OTHER): Payer: Self-pay

## 2016-01-23 VITALS — BP 140/78 | HR 78 | Ht 64.0 in | Wt 197.4 lb

## 2016-01-23 DIAGNOSIS — I10 Essential (primary) hypertension: Secondary | ICD-10-CM

## 2016-01-23 DIAGNOSIS — R001 Bradycardia, unspecified: Secondary | ICD-10-CM

## 2016-01-23 DIAGNOSIS — I498 Other specified cardiac arrhythmias: Secondary | ICD-10-CM | POA: Diagnosis not present

## 2016-01-23 DIAGNOSIS — E785 Hyperlipidemia, unspecified: Secondary | ICD-10-CM | POA: Diagnosis not present

## 2016-01-23 DIAGNOSIS — E559 Vitamin D deficiency, unspecified: Secondary | ICD-10-CM | POA: Diagnosis not present

## 2016-01-23 DIAGNOSIS — I2510 Atherosclerotic heart disease of native coronary artery without angina pectoris: Secondary | ICD-10-CM | POA: Diagnosis not present

## 2016-01-23 LAB — BASIC METABOLIC PANEL
BUN: 21 mg/dL (ref 7–25)
CALCIUM: 8.8 mg/dL (ref 8.6–10.4)
CO2: 28 mmol/L (ref 20–31)
CREATININE: 1.03 mg/dL — AB (ref 0.50–0.99)
Chloride: 103 mmol/L (ref 98–110)
GLUCOSE: 97 mg/dL (ref 65–99)
Potassium: 4.3 mmol/L (ref 3.5–5.3)
SODIUM: 141 mmol/L (ref 135–146)

## 2016-01-23 LAB — CBC
HCT: 40.7 % (ref 36.0–46.0)
HEMOGLOBIN: 13.3 g/dL (ref 12.0–15.0)
MCH: 28.1 pg (ref 26.0–34.0)
MCHC: 32.7 g/dL (ref 30.0–36.0)
MCV: 85.9 fL (ref 78.0–100.0)
MPV: 9.8 fL (ref 8.6–12.4)
PLATELETS: 274 10*3/uL (ref 150–400)
RBC: 4.74 MIL/uL (ref 3.87–5.11)
RDW: 14 % (ref 11.5–15.5)
WBC: 8 10*3/uL (ref 4.0–10.5)

## 2016-01-23 LAB — LIPID PANEL
CHOLESTEROL: 171 mg/dL (ref 125–200)
HDL: 48 mg/dL (ref >=46)
LDL Cholesterol: 101 mg/dL (ref <130)
TRIGLYCERIDES: 108 mg/dL (ref <150)
Total CHOL/HDL Ratio: 3.6 Ratio (ref <=5.0)
VLDL: 22 mg/dL (ref <30)

## 2016-01-23 LAB — TSH: TSH: 0.06 u[IU]/mL — ABNORMAL LOW (ref 0.350–4.500)

## 2016-01-23 LAB — AST: AST: 17 U/L (ref 10–35)

## 2016-01-23 MED ORDER — ROSUVASTATIN CALCIUM 20 MG PO TABS: 20.0000 mg | ORAL_TABLET | Freq: Every day | ORAL | Status: DC

## 2016-01-23 MED ORDER — ISOSORBIDE MONONITRATE ER 60 MG PO TB24: ORAL_TABLET | ORAL | Status: DC

## 2016-01-23 MED ORDER — CLOPIDOGREL BISULFATE 75 MG PO TABS: 75.0000 mg | ORAL_TABLET | Freq: Every day | ORAL | Status: DC

## 2016-01-23 MED ORDER — AMLODIPINE BESYLATE 10 MG PO TABS: 10.0000 mg | ORAL_TABLET | Freq: Every day | ORAL | Status: DC

## 2016-01-23 NOTE — Progress Notes (Signed)
Cardiology Office Note   Date:  01/23/2016   ID:  [name], DOB [birth year], MRN [id number]  PCP:  Penni Homans, MD  Cardiologist:   Dorris Carnes, MD   F/U of CAD     History of Present Illness: [name] is a 70 y.o. female with a history of CAD - s/p NSTEMI 10/2011 with DESx2 to RCA and normal EF. She underwent repeat LHC on 07/17/12 Patent RCA stent LCA with collaterals to R acute marginal were unchanged. Subtotal occlusion of acute marginal (exits mid stent). Recomm for medical Rx at that time. Other issues include thyroidectomy, GERD, HTN, HLD, vertigo. lexiscan myoview earlier this year. This showed inferolateral scar with minimal periinfarct ischemia. I reviewed with her on the phone  I saw her in September 2016  Lipids in November LDL ws 97    Occasional chest heaviness  Not with activity Still having signif problems with neck  Does not want surgery  Undergoing PT  Current Outpatient Prescriptions  Medication Sig Dispense Refill  . amLODipine (NORVASC) 10 MG tablet Take 1 tablet (10 mg total) by mouth daily. 90 tablet 1  . aspirin EC 81 MG tablet Take 81 mg by mouth daily.      . B Complex-C (B-COMPLEX WITH VITAMIN C) tablet Take 1 tablet by mouth daily.    . calcitRIOL (ROCALTROL) 0.25 MCG capsule Take 3 capsules (0.75 mcg total) by mouth daily. Take 3 cap daily 270 capsule 1  . calcium carbonate (TUMS - DOSED IN MG ELEMENTAL CALCIUM) 500 MG chewable tablet Chew 4 tablets by mouth 2 (two) times daily.     . cholecalciferol (VITAMIN D) 1000 UNITS tablet Take 1,000 Units by mouth daily.    . clopidogrel (PLAVIX) 75 MG tablet Take 1 tablet (75 mg total) by mouth daily. 90 tablet 1  . diazepam (VALIUM) 5 MG tablet Take 5 mg by mouth at bedtime as needed. (SLEEP)  0  . famotidine (PEPCID) 20 MG tablet Take 1 tablet by mouth two  times daily 180 tablet 1  . HYDROcodone-acetaminophen (NORCO) 10-325 MG per tablet Take 1 tablet by mouth every 4 (four) hours as  needed. (PAIN)  0  . isosorbide mononitrate (IMDUR) 60 MG 24 hr tablet Take 1 1/2 tablets by mouth daily - total of 90mg . 135 tablet 3  . levothyroxine (SYNTHROID, LEVOTHROID) 112 MCG tablet Take 1 tablet (112 mcg total) by mouth daily. 30 tablet 3  . LORazepam (ATIVAN) 0.5 MG tablet Take 1-2 tablets once as needed for Nuclear Medicine scan. 2 tablet 0  . Menthol, Topical Analgesic, (ICY HOT EX) Apply 1 patch topically daily.    . nitroGLYCERIN (NITROSTAT) 0.4 MG SL tablet Dissolve 1 tablet under the tongue every 5 minutes as   needed for chest pain as   directed (Patient taking differently: Place 0.4 mg under the tongue every 5 (five) minutes as needed for chest pain. ) 25 tablet 2  . rosuvastatin (CRESTOR) 20 MG tablet Take 1 tablet (20 mg total) by mouth daily. 90 tablet 1   No current facility-administered medications for this visit.    Allergies:   Zetia   Past Medical History  Diagnosis Date  . GERD (gastroesophageal reflux disease)     occasional  . Hyperlipidemia   . Hypertension     Echocardiogram 11/19/11: Difficult acoustic windows, EF 60-65%, normal LV wall thickness, grade 1 diastolic dysfunction  . Vertigo   . Tubular adenoma of colon 06/2011  . Colitis   .  Multinodular thyroid     Goiter s/p thyroidectomy in 2007 with post-op  hypocalcemia and post-op hypothyroidism/hypoparathyroidism with hypocalcemia  . CAD (coronary artery disease)     a) NSTEMI 10/2011: LHC 11/19/11: pLAD 30%, oOM 60%, AVCFX 30%, CFX after OM2 30%, pRCA 60 and 70%, then 99%, AM 80-90% with TIMI 3 flow.  PCI: Promus DES x 2 to RCA.  Marland Kitchen Post-surgical hypothyroidism   . Hypoparathyroidism ([place])   . Palpitations     a) 03/2012 - patient set up for event monitor but did not wear correctly -  she declined wearing a repeat monitor  . Unspecified constipation 05/31/2013  . Back pain 05/31/2013  . Nocturia 05/31/2013  . Neck pain on right side 07/13/2013  . MI (myocardial infarction) [facility])     november 2013  .  Sun-damaged skin 12/05/2014  . Medicare annual wellness visit, subsequent 03/13/2015  . Preventative health care 03/13/2015  . Zinc deficiency 11/26/2015    Past Surgical History  Procedure Laterality Date  . Colonoscopy  Aenomatous polyps    07/05/2011  . Total thyroidectomy  2007    GOITER  . Shoulder arthroscopy w/ rotator cuff repair      right  . Coronary angioplasty with stent placement    . Colonoscopy N/A 09/28/2014    Procedure: COLONOSCOPY;  Surgeon: Ladene Artist, MD;  Location: [facility] ENDOSCOPY;  Service: Endoscopy;  Laterality: N/A;  . Percutaneous coronary stent intervention (pci-s) N/A 11/19/2011    Procedure: PERCUTANEOUS CORONARY STENT INTERVENTION (PCI-S);  Surgeon: Hillary Bow, MD;  Location: [facility] CATH LAB;  Service: Cardiovascular;  Laterality: N/A;  . Left heart catheterization with coronary angiogram N/A 07/17/2012    Procedure: LEFT HEART CATHETERIZATION WITH CORONARY ANGIOGRAM;  Surgeon: Hillary Bow, MD;  Location: [facility] CATH LAB;  Service: Cardiovascular;  Laterality: N/A;     Social History:  The patient  reports that she has never smoked. She has never used smokeless tobacco. She reports that she does not drink alcohol or use illicit drugs.   Family History:  The patient's family history includes Blindness in her sister; Cancer in her brother and brother; Colon cancer in her brother; Congestive Heart Failure in her sister; Heart disease in her father; Hypertension in her father and sister; Thyroid disease in her sister. There is no history of Diabetes, Prostate cancer, or Breast cancer.    ROS:  Please see the history of present illness. All other systems are reviewed and  Negative to the above problem except as noted.    PHYSICAL EXAM: VS:  BP 140/78 mmHg  Pulse 78  Ht 5\' 4"  (1.626 m)  Wt 197 lb 6.4 oz (89.54 kg)  BMI 33.87 kg/m2  SpO2 96%  GEN: Well nourished, well developed, in no acute distress HEENT: normal Neck: no JVD, carotid bruits, or  masses Cardiac: RRR; no murmurs, rubs, or gallops,no edema  Respiratory:  clear to auscultation bilaterally, normal work of breathing GI: soft, nontender, nondistended, + BS  No hepatomegaly  MS: no deformity Moving all extremities   Skin: warm and dry, no rash Neuro:  Strength and sensation are intact Psych: euthymic mood, full affect   EKG:  EKG is not ordered today.   Lipid Panel    Component Value Date/Time   CHOL 194 11/14/2015 0902   TRIG 225* 11/14/2015 0902   HDL 52 11/14/2015 0902   CHOLHDL 3.7 11/14/2015 0902   VLDL 45* 11/14/2015 0902   LDLCALC 97 11/14/2015 0902   LDLDIRECT  83.8 08/11/2012 1203      Wt Readings from Last 3 Encounters:  01/23/16 197 lb 6.4 oz (89.54 kg)  11/23/15 197 lb (89.359 kg)  10/26/15 193 lb (87.544 kg)      ASSESSMENT AND PLAN:  1. CAD  No symptoms to sugg angina  2.  hL  Keep on current statins   3.  HTN  Fair c control    Will check CBC, BMET, lipids and Vit D today   Current medicines are reviewed at length with the patient today.  The patient does not have concerns regarding medicines.   Disposition:   FU with me next fall.  Signed, Dorris Carnes, MD  01/23/2016 9:40 AM    [facility] [address], [place], [place]  [address] Phone: [phone]; Fax: [phone]

## 2016-01-23 NOTE — Patient Instructions (Signed)
Your physician recommends that you continue on your current medications as directed. Please refer to the Current Medication list given to you today. Your physician recommends that you return for lab work today (BMET, CBC, TSH, LIPIDS, AST, VIT D)  Your physician wants you to follow-up in: AUGUST, 2017 [place].  You will receive a reminder letter in the mail two months in advance. If you don't receive a letter, please call our office to schedule the follow-up appointment.

## 2016-01-24 LAB — VITAMIN D 25 HYDROXY (VIT D DEFICIENCY, FRACTURES): Vit D, 25-Hydroxy: 42 ng/mL (ref 30–100)

## 2016-01-27 ENCOUNTER — Encounter: Payer: Self-pay | Admitting: *Deleted

## 2016-01-30 ENCOUNTER — Other Ambulatory Visit: Payer: Self-pay | Admitting: Family Medicine

## 2016-01-30 DIAGNOSIS — Q72811 Congenital shortening of right lower limb: Secondary | ICD-10-CM | POA: Diagnosis not present

## 2016-01-30 DIAGNOSIS — M9901 Segmental and somatic dysfunction of cervical region: Secondary | ICD-10-CM | POA: Diagnosis not present

## 2016-01-30 DIAGNOSIS — M50322 Other cervical disc degeneration at C5-C6 level: Secondary | ICD-10-CM | POA: Diagnosis not present

## 2016-01-30 DIAGNOSIS — E038 Other specified hypothyroidism: Secondary | ICD-10-CM

## 2016-01-30 DIAGNOSIS — M9902 Segmental and somatic dysfunction of thoracic region: Secondary | ICD-10-CM | POA: Diagnosis not present

## 2016-01-30 DIAGNOSIS — M9905 Segmental and somatic dysfunction of pelvic region: Secondary | ICD-10-CM | POA: Diagnosis not present

## 2016-01-30 MED ORDER — LEVOTHYROXINE SODIUM 100 MCG PO TABS: 100.0000 ug | ORAL_TABLET | Freq: Every day | ORAL | Status: DC

## 2016-01-31 DIAGNOSIS — Q72811 Congenital shortening of right lower limb: Secondary | ICD-10-CM | POA: Diagnosis not present

## 2016-01-31 DIAGNOSIS — M9905 Segmental and somatic dysfunction of pelvic region: Secondary | ICD-10-CM | POA: Diagnosis not present

## 2016-01-31 DIAGNOSIS — M50322 Other cervical disc degeneration at C5-C6 level: Secondary | ICD-10-CM | POA: Diagnosis not present

## 2016-01-31 DIAGNOSIS — M9902 Segmental and somatic dysfunction of thoracic region: Secondary | ICD-10-CM | POA: Diagnosis not present

## 2016-01-31 DIAGNOSIS — M9901 Segmental and somatic dysfunction of cervical region: Secondary | ICD-10-CM | POA: Diagnosis not present

## 2016-02-02 DIAGNOSIS — M50322 Other cervical disc degeneration at C5-C6 level: Secondary | ICD-10-CM | POA: Diagnosis not present

## 2016-02-02 DIAGNOSIS — M9902 Segmental and somatic dysfunction of thoracic region: Secondary | ICD-10-CM | POA: Diagnosis not present

## 2016-02-02 DIAGNOSIS — M9901 Segmental and somatic dysfunction of cervical region: Secondary | ICD-10-CM | POA: Diagnosis not present

## 2016-02-02 DIAGNOSIS — M9905 Segmental and somatic dysfunction of pelvic region: Secondary | ICD-10-CM | POA: Diagnosis not present

## 2016-02-02 DIAGNOSIS — Q72811 Congenital shortening of right lower limb: Secondary | ICD-10-CM | POA: Diagnosis not present

## 2016-02-03 DIAGNOSIS — M50322 Other cervical disc degeneration at C5-C6 level: Secondary | ICD-10-CM | POA: Diagnosis not present

## 2016-02-03 DIAGNOSIS — M542 Cervicalgia: Secondary | ICD-10-CM | POA: Diagnosis not present

## 2016-02-03 DIAGNOSIS — Q72811 Congenital shortening of right lower limb: Secondary | ICD-10-CM | POA: Diagnosis not present

## 2016-02-03 DIAGNOSIS — M50323 Other cervical disc degeneration at C6-C7 level: Secondary | ICD-10-CM | POA: Diagnosis not present

## 2016-02-03 DIAGNOSIS — M5136 Other intervertebral disc degeneration, lumbar region: Secondary | ICD-10-CM | POA: Diagnosis not present

## 2016-02-03 DIAGNOSIS — M50321 Other cervical disc degeneration at C4-C5 level: Secondary | ICD-10-CM | POA: Diagnosis not present

## 2016-02-03 DIAGNOSIS — M9901 Segmental and somatic dysfunction of cervical region: Secondary | ICD-10-CM | POA: Diagnosis not present

## 2016-02-03 DIAGNOSIS — M9905 Segmental and somatic dysfunction of pelvic region: Secondary | ICD-10-CM | POA: Diagnosis not present

## 2016-02-03 DIAGNOSIS — M9902 Segmental and somatic dysfunction of thoracic region: Secondary | ICD-10-CM | POA: Diagnosis not present

## 2016-02-06 DIAGNOSIS — M9902 Segmental and somatic dysfunction of thoracic region: Secondary | ICD-10-CM | POA: Diagnosis not present

## 2016-02-06 DIAGNOSIS — M9905 Segmental and somatic dysfunction of pelvic region: Secondary | ICD-10-CM | POA: Diagnosis not present

## 2016-02-06 DIAGNOSIS — M9901 Segmental and somatic dysfunction of cervical region: Secondary | ICD-10-CM | POA: Diagnosis not present

## 2016-02-06 DIAGNOSIS — M50322 Other cervical disc degeneration at C5-C6 level: Secondary | ICD-10-CM | POA: Diagnosis not present

## 2016-02-06 DIAGNOSIS — Q72811 Congenital shortening of right lower limb: Secondary | ICD-10-CM | POA: Diagnosis not present

## 2016-02-08 DIAGNOSIS — M9901 Segmental and somatic dysfunction of cervical region: Secondary | ICD-10-CM | POA: Diagnosis not present

## 2016-02-08 DIAGNOSIS — M9905 Segmental and somatic dysfunction of pelvic region: Secondary | ICD-10-CM | POA: Diagnosis not present

## 2016-02-08 DIAGNOSIS — M50322 Other cervical disc degeneration at C5-C6 level: Secondary | ICD-10-CM | POA: Diagnosis not present

## 2016-02-08 DIAGNOSIS — M9902 Segmental and somatic dysfunction of thoracic region: Secondary | ICD-10-CM | POA: Diagnosis not present

## 2016-02-08 DIAGNOSIS — Q72811 Congenital shortening of right lower limb: Secondary | ICD-10-CM | POA: Diagnosis not present

## 2016-02-09 DIAGNOSIS — M50322 Other cervical disc degeneration at C5-C6 level: Secondary | ICD-10-CM | POA: Diagnosis not present

## 2016-02-09 DIAGNOSIS — M9901 Segmental and somatic dysfunction of cervical region: Secondary | ICD-10-CM | POA: Diagnosis not present

## 2016-02-09 DIAGNOSIS — M9902 Segmental and somatic dysfunction of thoracic region: Secondary | ICD-10-CM | POA: Diagnosis not present

## 2016-02-09 DIAGNOSIS — M9905 Segmental and somatic dysfunction of pelvic region: Secondary | ICD-10-CM | POA: Diagnosis not present

## 2016-02-09 DIAGNOSIS — Q72811 Congenital shortening of right lower limb: Secondary | ICD-10-CM | POA: Diagnosis not present

## 2016-02-13 DIAGNOSIS — Q72811 Congenital shortening of right lower limb: Secondary | ICD-10-CM | POA: Diagnosis not present

## 2016-02-13 DIAGNOSIS — M9902 Segmental and somatic dysfunction of thoracic region: Secondary | ICD-10-CM | POA: Diagnosis not present

## 2016-02-13 DIAGNOSIS — M9905 Segmental and somatic dysfunction of pelvic region: Secondary | ICD-10-CM | POA: Diagnosis not present

## 2016-02-13 DIAGNOSIS — M9901 Segmental and somatic dysfunction of cervical region: Secondary | ICD-10-CM | POA: Diagnosis not present

## 2016-02-13 DIAGNOSIS — M50322 Other cervical disc degeneration at C5-C6 level: Secondary | ICD-10-CM | POA: Diagnosis not present

## 2016-02-14 DIAGNOSIS — Q72811 Congenital shortening of right lower limb: Secondary | ICD-10-CM | POA: Diagnosis not present

## 2016-02-14 DIAGNOSIS — M50322 Other cervical disc degeneration at C5-C6 level: Secondary | ICD-10-CM | POA: Diagnosis not present

## 2016-02-14 DIAGNOSIS — M9901 Segmental and somatic dysfunction of cervical region: Secondary | ICD-10-CM | POA: Diagnosis not present

## 2016-02-14 DIAGNOSIS — M9902 Segmental and somatic dysfunction of thoracic region: Secondary | ICD-10-CM | POA: Diagnosis not present

## 2016-02-14 DIAGNOSIS — M9905 Segmental and somatic dysfunction of pelvic region: Secondary | ICD-10-CM | POA: Diagnosis not present

## 2016-02-20 DIAGNOSIS — M9902 Segmental and somatic dysfunction of thoracic region: Secondary | ICD-10-CM | POA: Diagnosis not present

## 2016-02-20 DIAGNOSIS — M9905 Segmental and somatic dysfunction of pelvic region: Secondary | ICD-10-CM | POA: Diagnosis not present

## 2016-02-20 DIAGNOSIS — M50322 Other cervical disc degeneration at C5-C6 level: Secondary | ICD-10-CM | POA: Diagnosis not present

## 2016-02-20 DIAGNOSIS — Q72811 Congenital shortening of right lower limb: Secondary | ICD-10-CM | POA: Diagnosis not present

## 2016-02-20 DIAGNOSIS — M9901 Segmental and somatic dysfunction of cervical region: Secondary | ICD-10-CM | POA: Diagnosis not present

## 2016-02-21 ENCOUNTER — Encounter: Payer: Self-pay | Admitting: Neurology

## 2016-02-21 ENCOUNTER — Telehealth: Payer: Self-pay | Admitting: *Deleted

## 2016-02-21 ENCOUNTER — Ambulatory Visit (INDEPENDENT_AMBULATORY_CARE_PROVIDER_SITE_OTHER): Payer: 59 | Admitting: Neurology

## 2016-02-21 VITALS — BP 137/71 | HR 74 | Ht 64.0 in | Wt 192.0 lb

## 2016-02-21 DIAGNOSIS — R269 Unspecified abnormalities of gait and mobility: Secondary | ICD-10-CM

## 2016-02-21 DIAGNOSIS — M5416 Radiculopathy, lumbar region: Secondary | ICD-10-CM | POA: Diagnosis not present

## 2016-02-21 MED ORDER — GABAPENTIN 100 MG PO CAPS: 300.0000 mg | ORAL_CAPSULE | Freq: Three times a day (TID) | ORAL | Status: DC

## 2016-02-21 NOTE — Progress Notes (Signed)
PATIENT: [name] DOB: 1946-07-19  No chief complaint on file.    HISTORICAL  [place] Couser Sansouris a 70 years old right-handed female, seen in refer by Dr. Suella Broad November third 2016 for evaluation of gait difficulty, low back pain,  She had a past medical history of hypertension, hyperlipidemia, coronary artery disease, status post stent, is taking aspirin and Plavix   I have reviewed and summarized her referring note, she was seen by [place] in the past few months, for chronic low back pain, chronic neck pain, multiple body part pain,  Per record, she had MRI of cervical spine in July 05 2015 at [facility] orthopedic clinic, which showed cervical degenerative disc disease, she also complains of pain in her neck, going down to both arms, also complains of low back pain, intermittently going to her legs,  She also reported that she was evaluated by neurosurgeon Dr. Sherley Bounds in August 2016, did suggest ACDF with plating at C4-5, C5 and 6, she did not proceed with surgery.  In October 2016 she woke up one morning, noticed dense numbness at right lateral leg, numbness from waist down, lasted 2-3 hours, she could not bear weight, has to lie down and resting, eventually symptoms gradually resolved, she went to the emergency room September 26 2015, laboratory showed normal CMP CBC,negative troponin.  Over the past 1 year, she had multiple epidural injection by Dr. Nelva Bush, which did help her symptoms, last injection was in June 2016, will have repeat injection in November ninth 2016, she now complains severe right-sided low back pain, radiating pain to right lateral leg, right hip pain, difficulty bearing weight, difficulty walking  I have personally reviewed MRI lumbar in July 05 2015, L4-5, near-complete resolution of the large disc extrusion since previous study of May 2014, a minimum residual right posterolateral cranial disc extrusion which contact but  does not display the right L5 root, interval development of mild to moderate diffuse degenerative disc height loss.  UPDATE Feb 28th 2017: She had a bone scan of the whole body, there is evidence of spine degenerative changes, no evidence of he pathology   We have personally reviewed MRI of cervical spine in July 2016 this was done at [facility] orthopedic clinic, there is multilevel cervical degenerative changes, most severe at C5-6, a posterior disc osteophyte complex with potential displaced of the left C6 nerve roots, there was no significant canal stenosis.   REVIEW OF SYSTEMS: Full 14 system review of systems performed and notable only for as above:  ALLERGIES: Allergies  Allergen Reactions  . Zetia [Ezetimibe] Other (See Comments)    Myalgia, paresthesias and weakness    HOME MEDICATIONS: Current Outpatient Prescriptions  Medication Sig Dispense Refill  . amLODipine (NORVASC) 10 MG tablet Take 1 tablet (10 mg total) by mouth daily. 90 tablet 3  . aspirin EC 81 MG tablet Take 81 mg by mouth daily.      . B Complex-C (B-COMPLEX WITH VITAMIN C) tablet Take 1 tablet by mouth daily.    . calcitRIOL (ROCALTROL) 0.25 MCG capsule Take 3 capsules (0.75 mcg total) by mouth daily. Take 3 cap daily 270 capsule 1  . calcium carbonate (TUMS - DOSED IN MG ELEMENTAL CALCIUM) 500 MG chewable tablet Chew 4 tablets by mouth 2 (two) times daily.     . cholecalciferol (VITAMIN D) 1000 UNITS tablet Take 1,000 Units by mouth daily.    . clopidogrel (PLAVIX) 75 MG tablet Take 1 tablet (75  mg total) by mouth daily. 90 tablet 3  . diazepam (VALIUM) 5 MG tablet Take 5 mg by mouth at bedtime as needed. (SLEEP)  0  . famotidine (PEPCID) 20 MG tablet Take 1 tablet by mouth two  times daily 180 tablet 1  . HYDROcodone-acetaminophen (NORCO) 10-325 MG per tablet Take 1 tablet by mouth every 4 (four) hours as needed. (PAIN)  0  . isosorbide mononitrate (IMDUR) 60 MG 24 hr tablet Take 1 1/2 tablets by mouth daily -  total of 90mg . 135 tablet 3  . levothyroxine (SYNTHROID, LEVOTHROID) 100 MCG tablet Take 1 tablet (100 mcg total) by mouth daily. 30 tablet 3  . LORazepam (ATIVAN) 0.5 MG tablet Take 1-2 tablets once as needed for Nuclear Medicine scan. 2 tablet 0  . Menthol, Topical Analgesic, (ICY HOT EX) Apply 1 patch topically daily.    . nitroGLYCERIN (NITROSTAT) 0.4 MG SL tablet Dissolve 1 tablet under the tongue every 5 minutes as   needed for chest pain as   directed (Patient taking differently: Place 0.4 mg under the tongue every 5 (five) minutes as needed for chest pain. ) 25 tablet 2  . rosuvastatin (CRESTOR) 20 MG tablet Take 1 tablet (20 mg total) by mouth daily. 90 tablet 3   No current facility-administered medications for this visit.    PAST MEDICAL HISTORY: Past Medical History  Diagnosis Date  . GERD (gastroesophageal reflux disease)     occasional  . Hyperlipidemia   . Hypertension     Echocardiogram 11/19/11: Difficult acoustic windows, EF 60-65%, normal LV wall thickness, grade 1 diastolic dysfunction  . Vertigo   . Tubular adenoma of colon 06/2011  . Colitis   . Multinodular thyroid     Goiter s/p thyroidectomy in 2007 with post-op  hypocalcemia and post-op hypothyroidism/hypoparathyroidism with hypocalcemia  . CAD (coronary artery disease)     a) NSTEMI 10/2011: LHC 11/19/11: pLAD 30%, oOM 60%, AVCFX 30%, CFX after OM2 30%, pRCA 60 and 70%, then 99%, AM 80-90% with TIMI 3 flow.  PCI: Promus DES x 2 to RCA.  Marland Kitchen Post-surgical hypothyroidism   . Hypoparathyroidism ([place])   . Palpitations     a) 03/2012 - patient set up for event monitor but did not wear correctly -  she declined wearing a repeat monitor  . Unspecified constipation 05/31/2013  . Back pain 05/31/2013  . Nocturia 05/31/2013  . Neck pain on right side 07/13/2013  . MI (myocardial infarction) [facility])     november 2013  . Sun-damaged skin 12/05/2014  . Medicare annual wellness visit, subsequent 03/13/2015  . Preventative health  care 03/13/2015  . Zinc deficiency 11/26/2015    PAST SURGICAL HISTORY: Past Surgical History  Procedure Laterality Date  . Colonoscopy  Aenomatous polyps    07/05/2011  . Total thyroidectomy  2007    GOITER  . Shoulder arthroscopy w/ rotator cuff repair      right  . Coronary angioplasty with stent placement    . Colonoscopy N/A 09/28/2014    Procedure: COLONOSCOPY;  Surgeon: Ladene Artist, MD;  Location: [facility] ENDOSCOPY;  Service: Endoscopy;  Laterality: N/A;  . Percutaneous coronary stent intervention (pci-s) N/A 11/19/2011    Procedure: PERCUTANEOUS CORONARY STENT INTERVENTION (PCI-S);  Surgeon: Hillary Bow, MD;  Location: [facility] CATH LAB;  Service: Cardiovascular;  Laterality: N/A;  . Left heart catheterization with coronary angiogram N/A 07/17/2012    Procedure: LEFT HEART CATHETERIZATION WITH CORONARY ANGIOGRAM;  Surgeon: Hillary Bow, MD;  Location:  [place] CATH LAB;  Service: Cardiovascular;  Laterality: N/A;    FAMILY HISTORY: Family History  Problem Relation Age of Onset  . Colon cancer Brother   . Cancer Brother     COLON  . Hypertension Father   . Heart disease Father   . Diabetes Neg Hx   . Prostate cancer Neg Hx   . Breast cancer Neg Hx   . Blindness Sister   . Congestive Heart Failure Sister   . Hypertension Sister   . Thyroid disease Sister   . Cancer Brother     multiple myelomas    SOCIAL HISTORY:  Social History   Social History  . Marital Status: Married    Spouse Name: N/A  . Number of Children: 4  . Years of Education: 14   Occupational History  . Sales person at [employer] Topics  . Smoking status: Never Smoker   . Smokeless tobacco: Never Used  . Alcohol Use: No  . Drug Use: No  . Sexual Activity: Not on file   Other Topics Concern  . Not on file   Social History Narrative   Lives at home alone.  Her son lives near her.   Right-handed.   1 cup coffee per day.     PHYSICAL EXAM   There were no vitals filed  for this visit.  Not recorded      There is no weight on file to calculate BMI.  PHYSICAL EXAMNIATION:  Gen: NAD, conversant, well nourised, obese, well groomed                     Cardiovascular: Regular rate rhythm, no peripheral edema, warm, nontender. Eyes: Conjunctivae clear without exudates or hemorrhage Neck: Supple, no carotid bruise. Pulmonary: Clear to auscultation bilaterally   NEUROLOGICAL EXAM:  MENTAL STATUS: Speech:    Speech is normal; fluent and spontaneous with normal comprehension.  Cognition:     Orientation to time, place and person     Normal recent and remote memory     Normal Attention span and concentration     Normal Language, naming, repeating,spontaneous speech     Fund of knowledge   CRANIAL NERVES: CN II: Visual fields are full to confrontation. Fundoscopic exam is normal with sharp discs and no vascular changes. Pupils are round equal and briskly reactive to light. CN III, IV, VI: extraocular movement are normal. No ptosis. CN V: Facial sensation is intact to pinprick in all 3 divisions bilaterally. Corneal responses are intact.  CN VII: Face is symmetric with normal eye closure and smile. CN VIII: Hearing is normal to rubbing fingers CN IX, X: Palate elevates symmetrically. Phonation is normal. CN XI: Head turning and shoulder shrug are intact CN XII: Tongue is midline with normal movements and no atrophy.  MOTOR: There is no pronator drift of out-stretched arms. Muscle bulk and tone are normal. Muscle strength is normal.  REFLEXES: Reflexes are 2+ and symmetric at the biceps, triceps, knees, and ankles. Plantar responses are flexor.  SENSORY: Intact to light touch, pinprick, position sense, and vibration sense are intact in fingers and toes.  COORDINATION: Rapid alternating movements and fine finger movements are intact. There is no dysmetria on finger-to-nose and heel-knee-shin.    GAIT/STANCE: Antalgic, limb, dragging right leg,  complains of right-sided low back pain, right hip pain with right cross leg exam   DIAGNOSTIC DATA (LABS, IMAGING, TESTING) - I reviewed patient records, labs, notes, testing  and imaging myself where available.  ASSESSMENT AND PLAN  Syesha ANASTASYA WHITEHILL is a 70 y.o. female   Right hip pain  Most consistent with right lumbar radiculopathy, there is no significant right hip pathology on x-ray  Gabapentin 100 mg 3 times a day  Gait difficulty  Multifactorial,  lumbar radiculopathy, multiple joints pain   I have advised her when necessary NSAIDs, heating pad, moderate exercise,   Marcial Pacas, M.D. Ph.D.  [facility] [address], [place], [place] [address] Ph: [phone] Fax: [phone]  CC: Suella Broad, MD

## 2016-02-21 NOTE — Telephone Encounter (Signed)
Pt arrived late to her appt.  Ok per Dr. Krista Blue to still be seen.

## 2016-02-21 NOTE — Telephone Encounter (Signed)
No showed follow up appointment. 

## 2016-02-23 DIAGNOSIS — M9902 Segmental and somatic dysfunction of thoracic region: Secondary | ICD-10-CM | POA: Diagnosis not present

## 2016-02-23 DIAGNOSIS — Q72811 Congenital shortening of right lower limb: Secondary | ICD-10-CM | POA: Diagnosis not present

## 2016-02-23 DIAGNOSIS — M50322 Other cervical disc degeneration at C5-C6 level: Secondary | ICD-10-CM | POA: Diagnosis not present

## 2016-02-23 DIAGNOSIS — M9901 Segmental and somatic dysfunction of cervical region: Secondary | ICD-10-CM | POA: Diagnosis not present

## 2016-02-23 DIAGNOSIS — M9905 Segmental and somatic dysfunction of pelvic region: Secondary | ICD-10-CM | POA: Diagnosis not present

## 2016-02-27 DIAGNOSIS — M9902 Segmental and somatic dysfunction of thoracic region: Secondary | ICD-10-CM | POA: Diagnosis not present

## 2016-02-27 DIAGNOSIS — M50322 Other cervical disc degeneration at C5-C6 level: Secondary | ICD-10-CM | POA: Diagnosis not present

## 2016-02-27 DIAGNOSIS — Q72811 Congenital shortening of right lower limb: Secondary | ICD-10-CM | POA: Diagnosis not present

## 2016-02-27 DIAGNOSIS — M9901 Segmental and somatic dysfunction of cervical region: Secondary | ICD-10-CM | POA: Diagnosis not present

## 2016-02-27 DIAGNOSIS — M9905 Segmental and somatic dysfunction of pelvic region: Secondary | ICD-10-CM | POA: Diagnosis not present

## 2016-03-02 DIAGNOSIS — M50322 Other cervical disc degeneration at C5-C6 level: Secondary | ICD-10-CM | POA: Diagnosis not present

## 2016-03-02 DIAGNOSIS — M9901 Segmental and somatic dysfunction of cervical region: Secondary | ICD-10-CM | POA: Diagnosis not present

## 2016-03-02 DIAGNOSIS — M9905 Segmental and somatic dysfunction of pelvic region: Secondary | ICD-10-CM | POA: Diagnosis not present

## 2016-03-02 DIAGNOSIS — M9902 Segmental and somatic dysfunction of thoracic region: Secondary | ICD-10-CM | POA: Diagnosis not present

## 2016-03-02 DIAGNOSIS — Q72811 Congenital shortening of right lower limb: Secondary | ICD-10-CM | POA: Diagnosis not present

## 2016-03-07 ENCOUNTER — Encounter (HOSPITAL_COMMUNITY): Payer: Self-pay | Admitting: *Deleted

## 2016-03-07 ENCOUNTER — Observation Stay (HOSPITAL_COMMUNITY)
Admission: EM | Admit: 2016-03-07 | Discharge: 2016-03-08 | Disposition: A | Discharge status: Home / Self Care | Payer: Medicare Other | Attending: Internal Medicine | Admitting: Internal Medicine

## 2016-03-07 ENCOUNTER — Emergency Department (HOSPITAL_COMMUNITY): Payer: Medicare Other

## 2016-03-07 DIAGNOSIS — Z8249 Family history of ischemic heart disease and other diseases of the circulatory system: Secondary | ICD-10-CM | POA: Insufficient documentation

## 2016-03-07 DIAGNOSIS — Z7982 Long term (current) use of aspirin: Secondary | ICD-10-CM | POA: Insufficient documentation

## 2016-03-07 DIAGNOSIS — I25110 Atherosclerotic heart disease of native coronary artery with unstable angina pectoris: Principal | ICD-10-CM | POA: Insufficient documentation

## 2016-03-07 DIAGNOSIS — Z7902 Long term (current) use of antithrombotics/antiplatelets: Secondary | ICD-10-CM | POA: Insufficient documentation

## 2016-03-07 DIAGNOSIS — I5032 Chronic diastolic (congestive) heart failure: Secondary | ICD-10-CM | POA: Insufficient documentation

## 2016-03-07 DIAGNOSIS — R079 Chest pain, unspecified: Secondary | ICD-10-CM | POA: Diagnosis not present

## 2016-03-07 DIAGNOSIS — I10 Essential (primary) hypertension: Secondary | ICD-10-CM | POA: Diagnosis not present

## 2016-03-07 DIAGNOSIS — K219 Gastro-esophageal reflux disease without esophagitis: Secondary | ICD-10-CM | POA: Diagnosis not present

## 2016-03-07 DIAGNOSIS — E892 Postprocedural hypoparathyroidism: Secondary | ICD-10-CM | POA: Insufficient documentation

## 2016-03-07 DIAGNOSIS — I2510 Atherosclerotic heart disease of native coronary artery without angina pectoris: Secondary | ICD-10-CM | POA: Diagnosis not present

## 2016-03-07 DIAGNOSIS — E890 Postprocedural hypothyroidism: Secondary | ICD-10-CM | POA: Insufficient documentation

## 2016-03-07 DIAGNOSIS — I252 Old myocardial infarction: Secondary | ICD-10-CM | POA: Diagnosis not present

## 2016-03-07 DIAGNOSIS — Z79899 Other long term (current) drug therapy: Secondary | ICD-10-CM | POA: Diagnosis not present

## 2016-03-07 DIAGNOSIS — I110 Hypertensive heart disease with heart failure: Secondary | ICD-10-CM | POA: Insufficient documentation

## 2016-03-07 DIAGNOSIS — E785 Hyperlipidemia, unspecified: Secondary | ICD-10-CM | POA: Diagnosis not present

## 2016-03-07 DIAGNOSIS — Z955 Presence of coronary angioplasty implant and graft: Secondary | ICD-10-CM | POA: Insufficient documentation

## 2016-03-07 DIAGNOSIS — J9811 Atelectasis: Secondary | ICD-10-CM | POA: Diagnosis not present

## 2016-03-07 DIAGNOSIS — R072 Precordial pain: Secondary | ICD-10-CM | POA: Diagnosis not present

## 2016-03-07 HISTORY — DX: Hypertensive heart disease without heart failure: I11.9

## 2016-03-07 LAB — BASIC METABOLIC PANEL
Anion gap: 13 (ref 5–15)
BUN: 22 mg/dL — AB (ref 6–20)
CHLORIDE: 103 mmol/L (ref 101–111)
CO2: 27 mmol/L (ref 22–32)
CREATININE: 1.24 mg/dL — AB (ref 0.44–1.00)
Calcium: 8.4 mg/dL — ABNORMAL LOW (ref 8.9–10.3)
GFR calc Af Amer: 50 mL/min — ABNORMAL LOW (ref >60)
GFR calc non Af Amer: 43 mL/min — ABNORMAL LOW (ref >60)
GLUCOSE: 108 mg/dL — AB (ref 65–99)
POTASSIUM: 4.1 mmol/L (ref 3.5–5.1)
SODIUM: 143 mmol/L (ref 135–145)

## 2016-03-07 LAB — CBC
HEMATOCRIT: 40.7 % (ref 36.0–46.0)
Hemoglobin: 13.2 g/dL (ref 12.0–15.0)
MCH: 28.3 pg (ref 26.0–34.0)
MCHC: 32.4 g/dL (ref 30.0–36.0)
MCV: 87.3 fL (ref 78.0–100.0)
PLATELETS: 246 10*3/uL (ref 150–400)
RBC: 4.66 MIL/uL (ref 3.87–5.11)
RDW: 14.6 % (ref 11.5–15.5)
WBC: 11 10*3/uL — ABNORMAL HIGH (ref 4.0–10.5)

## 2016-03-07 LAB — PROTIME-INR
INR: 0.86 (ref 0.00–1.49)
Prothrombin Time: 12 seconds (ref 11.6–15.2)

## 2016-03-07 LAB — I-STAT TROPONIN, ED
TROPONIN I, POC: 0.01 ng/mL (ref 0.00–0.08)
Troponin i, poc: 0.01 ng/mL (ref 0.00–0.08)

## 2016-03-07 MED ORDER — ASPIRIN EC 81 MG PO TBEC
162.0000 mg | DELAYED_RELEASE_TABLET | Freq: Once | ORAL | Status: AC
  Administered 2016-03-08: 162 mg via ORAL
  Filled 2016-03-07: qty 2

## 2016-03-07 NOTE — ED Provider Notes (Signed)
CSN: [id number]     Arrival date & time 03/07/16  1830 History   First MD Initiated Contact with Patient 03/07/16 2151     Chief Complaint  Patient presents with  . Chest Pain      HPI  70 y.o. female with history of hyperlipidemia, hypertension, coronary artery disease status post NSTEMI in 2012 with 2 drug-eluting stents in place, followed by cardiology here, who presents with 4 hours of substernal crushing chest pain radiating to the left arm. Pain did not resolve with nitroglycerin. Patient does have a history of angina but reports that the pain usually is not this long lasting and usually resolves with nitroglycerin. She took 81 mg of aspirin prior to arrival and is on Plavix. Endorses nausea when the pain was at its most severe but no vomiting. Denies shortness of breath. Reports that her pain has now completely resolved. No exertional component. Denies radiation to the back or tearing sensation. Denies recent cough or fevers.   Past Medical History  Diagnosis Date  . GERD (gastroesophageal reflux disease)     occasional  . Hyperlipidemia   . Hypertension     Echocardiogram 11/19/11: Difficult acoustic windows, EF 60-65%, normal LV wall thickness, grade 1 diastolic dysfunction  . Vertigo   . Tubular adenoma of colon 06/2011  . Colitis   . Multinodular thyroid     Goiter s/p thyroidectomy in 2007 with post-op  hypocalcemia and post-op hypothyroidism/hypoparathyroidism with hypocalcemia  . CAD (coronary artery disease)     a) NSTEMI 10/2011: LHC 11/19/11: pLAD 30%, oOM 60%, AVCFX 30%, CFX after OM2 30%, pRCA 60 and 70%, then 99%, AM 80-90% with TIMI 3 flow.  PCI: Promus DES x 2 to RCA.  Marland Kitchen Post-surgical hypothyroidism   . Hypoparathyroidism ([place])   . Palpitations     a) 03/2012 - patient set up for event monitor but did not wear correctly -  she declined wearing a repeat monitor  . Unspecified constipation 05/31/2013  . Back pain 05/31/2013  . Nocturia 05/31/2013  . Neck pain on right  side 07/13/2013  . MI (myocardial infarction) [facility])     november 2013  . Sun-damaged skin 12/05/2014  . Medicare annual wellness visit, subsequent 03/13/2015  . Preventative health care 03/13/2015  . Zinc deficiency 11/26/2015   Past Surgical History  Procedure Laterality Date  . Colonoscopy  Aenomatous polyps    07/05/2011  . Total thyroidectomy  2007    GOITER  . Shoulder arthroscopy w/ rotator cuff repair      right  . Coronary angioplasty with stent placement    . Colonoscopy N/A 09/28/2014    Procedure: COLONOSCOPY;  Surgeon: Ladene Artist, MD;  Location: [facility] ENDOSCOPY;  Service: Endoscopy;  Laterality: N/A;  . Percutaneous coronary stent intervention (pci-s) N/A 11/19/2011    Procedure: PERCUTANEOUS CORONARY STENT INTERVENTION (PCI-S);  Surgeon: Hillary Bow, MD;  Location: [facility] CATH LAB;  Service: Cardiovascular;  Laterality: N/A;  . Left heart catheterization with coronary angiogram N/A 07/17/2012    Procedure: LEFT HEART CATHETERIZATION WITH CORONARY ANGIOGRAM;  Surgeon: Hillary Bow, MD;  Location: [facility] CATH LAB;  Service: Cardiovascular;  Laterality: N/A;   Family History  Problem Relation Age of Onset  . Colon cancer Brother   . Cancer Brother     COLON  . Hypertension Father   . Heart disease Father   . Diabetes Neg Hx   . Prostate cancer Neg Hx   . Breast cancer Neg Hx   .  Blindness Sister   . Congestive Heart Failure Sister   . Hypertension Sister   . Thyroid disease Sister   . Cancer Brother     multiple myelomas   Social History  Substance Use Topics  . Smoking status: Never Smoker   . Smokeless tobacco: Never Used  . Alcohol Use: No   OB History    Gravida Para Term Preterm AB TAB SAB Ectopic Multiple Living   4 4 4       4      Review of Systems  Constitutional: Negative for fever, chills, activity change and appetite change.  HENT: Negative for congestion, rhinorrhea and sore throat.   Eyes: Negative for visual disturbance.  Respiratory:  Negative for cough, shortness of breath and wheezing.   Cardiovascular: Positive for chest pain. Negative for palpitations.  Gastrointestinal: Positive for nausea. Negative for vomiting, abdominal pain, diarrhea, constipation, blood in stool and abdominal distention.  Genitourinary: Negative for dysuria, frequency and flank pain.  Musculoskeletal: Negative for myalgias, back pain, joint swelling, arthralgias, gait problem, neck pain and neck stiffness.  Skin: Negative for rash.  Neurological: Negative for dizziness, tremors, syncope, facial asymmetry, speech difficulty, weakness, numbness and headaches.  Psychiatric/Behavioral: Negative for behavioral problems, confusion and agitation.      Allergies  Zetia  Home Medications   Prior to Admission medications   Medication Sig Start Date End Date Taking? Authorizing Provider  amLODipine (NORVASC) 10 MG tablet Take 1 tablet (10 mg total) by mouth daily. 01/23/16  Yes Fay Records, MD  aspirin EC 81 MG tablet Take 81 mg by mouth daily.   11/22/11  Yes Dayna N Dunn, PA-C  B Complex-C (B-COMPLEX WITH VITAMIN C) tablet Take 1 tablet by mouth daily.   Yes Historical Provider, MD  calcitRIOL (ROCALTROL) 0.25 MCG capsule Take 3 capsules (0.75 mcg total) by mouth daily. Take 3 cap daily 09/12/15  Yes Mosie Lukes, MD  calcium carbonate (TUMS - DOSED IN MG ELEMENTAL CALCIUM) 500 MG chewable tablet Chew 4 tablets by mouth 2 (two) times daily.    Yes Historical Provider, MD  cholecalciferol (VITAMIN D) 1000 UNITS tablet Take 1,000 Units by mouth daily.   Yes Historical Provider, MD  clopidogrel (PLAVIX) 75 MG tablet Take 1 tablet (75 mg total) by mouth daily. 01/23/16  Yes Fay Records, MD  diazepam (VALIUM) 5 MG tablet Take 5 mg by mouth at bedtime as needed. (SLEEP) 07/01/15  Yes Historical Provider, MD  famotidine (PEPCID) 20 MG tablet Take 1 tablet by mouth two  times daily 09/12/15  Yes Mosie Lukes, MD  gabapentin (NEURONTIN) 100 MG capsule Take 3  capsules (300 mg total) by mouth 3 (three) times daily. 02/21/16  Yes Marcial Pacas, MD  isosorbide mononitrate (IMDUR) 60 MG 24 hr tablet Take 1 1/2 tablets by mouth daily - total of 90mg . 01/23/16  Yes Fay Records, MD  levothyroxine (SYNTHROID, LEVOTHROID) 100 MCG tablet Take 1 tablet (100 mcg total) by mouth daily. 01/30/16  Yes Mosie Lukes, MD  LORazepam (ATIVAN) 0.5 MG tablet Take 1-2 tablets once as needed for Nuclear Medicine scan. 11/24/15  Yes Mosie Lukes, MD  Menthol, Topical Analgesic, (ICY HOT EX) Apply 1 patch topically daily.   Yes Historical Provider, MD  nitroGLYCERIN (NITROSTAT) 0.4 MG SL tablet Dissolve 1 tablet under the tongue every 5 minutes as   needed for chest pain as   directed Patient taking differently: Place 0.4 mg under the tongue every 5 (  five) minutes as needed for chest pain.  05/19/15  Yes Mosie Lukes, MD  rosuvastatin (CRESTOR) 20 MG tablet Take 1 tablet (20 mg total) by mouth daily. 01/23/16  Yes Fay Records, MD   BP 133/67 mmHg  Pulse 61  Temp(Src) 98.5 F (36.9 C) (Oral)  Resp 12  Ht 5\' 4"  (1.626 m)  Wt 90.901 kg  BMI 34.38 kg/m2  SpO2 98% Physical Exam  Constitutional: She is oriented to person, place, and time. She appears well-developed and well-nourished. No distress.  HENT:  Head: Normocephalic and atraumatic.  Right Ear: External ear normal.  Left Ear: External ear normal.  Nose: Nose normal.  Mouth/Throat: Oropharynx is clear and moist. No oropharyngeal exudate.  Eyes: Conjunctivae and EOM are normal. Pupils are equal, round, and reactive to light. Right eye exhibits no discharge. Left eye exhibits no discharge.  Neck: Normal range of motion. Neck supple.  Cardiovascular: Normal rate, regular rhythm and normal heart sounds.  Exam reveals no gallop and no friction rub.   No murmur heard. Pulmonary/Chest: Effort normal and breath sounds normal. No respiratory distress. She has no wheezes. She has no rales.  Abdominal: Soft. Bowel sounds are  normal. She exhibits no distension and no mass. There is no tenderness. There is no rebound and no guarding.  Musculoskeletal: Normal range of motion. She exhibits no edema or tenderness.  Neurological: She is alert and oriented to person, place, and time. She exhibits normal muscle tone.  Skin: Skin is warm and dry. No rash noted. She is not diaphoretic.  Psychiatric: She has a normal mood and affect. Her behavior is normal. Judgment and thought content normal.    ED Course  Procedures (including critical care time) Labs Review Labs Reviewed  BASIC METABOLIC PANEL - Abnormal; Notable for the following:    Glucose, Bld 108 (*)    BUN 22 (*)    Creatinine, Ser 1.24 (*)    Calcium 8.4 (*)    GFR calc non Af Amer 43 (*)    GFR calc Af Amer 50 (*)    All other components within normal limits  CBC - Abnormal; Notable for the following:    WBC 11.0 (*)    All other components within normal limits  PROTIME-INR  I-STAT TROPOININ, ED  Randolm Idol, ED    Imaging Review Dg Chest 2 View  03/07/2016  CLINICAL DATA:  Acute onset of generalized chest pain and shortness of breath. Initial encounter. EXAM: CHEST  2 VIEW COMPARISON:  Chest radiograph performed 09/26/2015 FINDINGS: The lungs are well-aerated. Minimal bibasilar atelectasis is noted. There is no evidence of pleural effusion or pneumothorax. The heart is borderline normal in size. No acute osseous abnormalities are seen. IMPRESSION: Minimal bibasilar atelectasis noted.  Lungs otherwise clear. Electronically Signed   By: Garald Balding M.D.   On: 03/07/2016 19:08   I have personally reviewed and evaluated these images and lab results as part of my medical decision-making.   EKG Interpretation   Date/Time:  Wednesday March 07 2016 18:34:49 EDT Ventricular Rate:  79 PR Interval:  160 QRS Duration: 92 QT Interval:  416 QTC Calculation: 477 R Axis:   6 Text Interpretation:  Normal sinus rhythm Normal ECG No significant change   since last tracing Confirmed by [facility] MD, [place] ([id number]) on 03/07/2016  10:28:04 PM      MDM   Final diagnoses:  None   At the time of my evaluation the patient is chest pain-free. EKG  unremarkable with no ischemic changes. Troponin on presentation and then delta at 3 hours is 0.01. Additional 162 mg of aspirin given on patient arrival. Remainder of labs unremarkable. Doubt pulmonary embolism as patient denies dyspnea and chest pain is not pleuritic. After discussion with cardiology, will admit the patient to the hospitalist service for chest pain rule out and cardiology consultation in the morning. She is stable for transfer to the floor.  Quiara Killian Algernon Huxley, MD 03/07/16 2352  Gareth Morgan, MD 03/08/16 1349

## 2016-03-07 NOTE — H&P (Addendum)
PCP:  Penni Homans, MD  [facility]  Referring provider Irick   Chief Complaint:  Chest Pain  HPI: [name] is a 70 y.o. female   has a past medical history of GERD (gastroesophageal reflux disease); Hyperlipidemia; Hypertension; Vertigo; Tubular adenoma of colon (06/2011); Colitis; Multinodular thyroid; CAD (coronary artery disease); Post-surgical hypothyroidism; Hypoparathyroidism ([place]); Palpitations; Unspecified constipation (05/31/2013); Back pain (05/31/2013); Nocturia (05/31/2013); Neck pain on right side (07/13/2013); MI (myocardial infarction) ([place]); Sun-damaged skin (12/05/2014); Medicare annual wellness visit, subsequent (03/13/2015); Preventative health care (03/13/2015); and Zinc deficiency (11/26/2015).   Presented with chest pain lasting for the past 4 hours associated with heaviness radiating to left arm she's been having some increase in burping. This occurred at the time when she was laying on the couch. They left the house to go for a visit but her chest pressure got a bit se and they turned to the hospital. Patient took 1 nitroglycerin result much improvement which is not typical for her angina usually improves with nitroglycerin. Does associated with some nausea. Some slight shortness of breath. Currently chest pain resolved.   IN ER: Troponin within normal limits EKG without acute changes case was discussed with cardiology by ED provider cardiology was seen in the morning but for now wants to have patient admitted to telemetry for father evaluation. Creatinine was noted to be slightly above baseline at 1.24 from baseline of 1.0  Regarding pertinent past history: Known history of coronary artery disease old but cardiology last time was seen in and of January. Patient had non-ST elevation MI in 2012. DES 2 to RCA at that time EF was normal. Repeat left heart cath in 2013 showing P told C stents as well as collaterals is subtotal occlusion of acute marginal  and for medical therapy only  Hospitalist was called for admission for chest pain with history of coronary disease  Review of Systems:    Pertinent positives include: chest pain, shortness of breath at rest.  Constitutional:  No weight loss, night sweats, Fevers, chills, fatigue, weight loss  HEENT:  No headaches, Difficulty swallowing,Tooth/dental problems,Sore throat,  No sneezing, itching, ear ache, nasal congestion, post nasal drip,  Cardio-vascular:  No , Orthopnea, PND, anasarca, dizziness, palpitations.no Bilateral lower extremity swelling  GI:  No heartburn, indigestion, abdominal pain, nausea, vomiting, diarrhea, change in bowel habits, loss of appetite, melena, blood in stool, hematemesis Resp:   No dyspnea on exertion, No excess mucus, no productive cough, No non-productive cough, No coughing up of blood.No change in color of mucus.No wheezing. Skin:  no rash or lesions. No jaundice GU:  no dysuria, change in color of urine, no urgency or frequency. No straining to urinate.  No flank pain.  Musculoskeletal:  No joint pain or no joint swelling. No decreased range of motion. No back pain.  Psych:  No change in mood or affect. No depression or anxiety. No memory loss.  Neuro: no localizing neurological complaints, no tingling, no weakness, no double vision, no gait abnormality, no slurred speech, no confusion  Otherwise ROS are negative except for above, 10 systems were reviewed  Past Medical History: Past Medical History  Diagnosis Date  . GERD (gastroesophageal reflux disease)     occasional  . Hyperlipidemia   . Hypertension     Echocardiogram 11/19/11: Difficult acoustic windows, EF 60-65%, normal LV wall thickness, grade 1 diastolic dysfunction  . Vertigo   . Tubular adenoma of colon 06/2011  . Colitis   . Multinodular  thyroid     Goiter s/p thyroidectomy in 2007 with post-op  hypocalcemia and post-op hypothyroidism/hypoparathyroidism with hypocalcemia  . CAD  (coronary artery disease)     a) NSTEMI 10/2011: LHC 11/19/11: pLAD 30%, oOM 60%, AVCFX 30%, CFX after OM2 30%, pRCA 60 and 70%, then 99%, AM 80-90% with TIMI 3 flow.  PCI: Promus DES x 2 to RCA.  Marland Kitchen Post-surgical hypothyroidism   . Hypoparathyroidism ([place])   . Palpitations     a) 03/2012 - patient set up for event monitor but did not wear correctly -  she declined wearing a repeat monitor  . Unspecified constipation 05/31/2013  . Back pain 05/31/2013  . Nocturia 05/31/2013  . Neck pain on right side 07/13/2013  . MI (myocardial infarction) [facility])     november 2013  . Sun-damaged skin 12/05/2014  . Medicare annual wellness visit, subsequent 03/13/2015  . Preventative health care 03/13/2015  . Zinc deficiency 11/26/2015   Past Surgical History  Procedure Laterality Date  . Colonoscopy  Aenomatous polyps    07/05/2011  . Total thyroidectomy  2007    GOITER  . Shoulder arthroscopy w/ rotator cuff repair      right  . Coronary angioplasty with stent placement    . Colonoscopy N/A 09/28/2014    Procedure: COLONOSCOPY;  Surgeon: Ladene Artist, MD;  Location: [facility] ENDOSCOPY;  Service: Endoscopy;  Laterality: N/A;  . Percutaneous coronary stent intervention (pci-s) N/A 11/19/2011    Procedure: PERCUTANEOUS CORONARY STENT INTERVENTION (PCI-S);  Surgeon: Hillary Bow, MD;  Location: [facility] CATH LAB;  Service: Cardiovascular;  Laterality: N/A;  . Left heart catheterization with coronary angiogram N/A 07/17/2012    Procedure: LEFT HEART CATHETERIZATION WITH CORONARY ANGIOGRAM;  Surgeon: Hillary Bow, MD;  Location: [facility] CATH LAB;  Service: Cardiovascular;  Laterality: N/A;     Medications: Prior to Admission medications   Medication Sig Start Date End Date Taking? Authorizing Provider  amLODipine (NORVASC) 10 MG tablet Take 1 tablet (10 mg total) by mouth daily. 01/23/16  Yes Fay Records, MD  aspirin EC 81 MG tablet Take 81 mg by mouth daily.   11/22/11  Yes Dayna N Dunn, PA-C  B Complex-C (B-COMPLEX  WITH VITAMIN C) tablet Take 1 tablet by mouth daily.   Yes Historical Provider, MD  calcitRIOL (ROCALTROL) 0.25 MCG capsule Take 3 capsules (0.75 mcg total) by mouth daily. Take 3 cap daily 09/12/15  Yes Mosie Lukes, MD  calcium carbonate (TUMS - DOSED IN MG ELEMENTAL CALCIUM) 500 MG chewable tablet Chew 4 tablets by mouth 2 (two) times daily.    Yes Historical Provider, MD  cholecalciferol (VITAMIN D) 1000 UNITS tablet Take 1,000 Units by mouth daily.   Yes Historical Provider, MD  clopidogrel (PLAVIX) 75 MG tablet Take 1 tablet (75 mg total) by mouth daily. 01/23/16  Yes Fay Records, MD  diazepam (VALIUM) 5 MG tablet Take 5 mg by mouth at bedtime as needed. (SLEEP) 07/01/15  Yes Historical Provider, MD  famotidine (PEPCID) 20 MG tablet Take 1 tablet by mouth two  times daily 09/12/15  Yes Mosie Lukes, MD  gabapentin (NEURONTIN) 100 MG capsule Take 3 capsules (300 mg total) by mouth 3 (three) times daily. 02/21/16  Yes Marcial Pacas, MD  isosorbide mononitrate (IMDUR) 60 MG 24 hr tablet Take 1 1/2 tablets by mouth daily - total of 90mg . 01/23/16  Yes Fay Records, MD  levothyroxine (SYNTHROID, LEVOTHROID) 100 MCG tablet Take 1 tablet (100 mcg  total) by mouth daily. 01/30/16  Yes Mosie Lukes, MD  LORazepam (ATIVAN) 0.5 MG tablet Take 1-2 tablets once as needed for Nuclear Medicine scan. 11/24/15  Yes Mosie Lukes, MD  Menthol, Topical Analgesic, (ICY HOT EX) Apply 1 patch topically daily.   Yes Historical Provider, MD  nitroGLYCERIN (NITROSTAT) 0.4 MG SL tablet Dissolve 1 tablet under the tongue every 5 minutes as   needed for chest pain as   directed Patient taking differently: Place 0.4 mg under the tongue every 5 (five) minutes as needed for chest pain.  05/19/15  Yes Mosie Lukes, MD  rosuvastatin (CRESTOR) 20 MG tablet Take 1 tablet (20 mg total) by mouth daily. 01/23/16  Yes Fay Records, MD    Allergies:   Allergies  Allergen Reactions  . Zetia [Ezetimibe] Other (See Comments)    Myalgia,  paresthesias and weakness    Social History:  Ambulatory  Independently  Lives at home  With family     reports that she has never smoked. She has never used smokeless tobacco. She reports that she does not drink alcohol or use illicit drugs.     Family History: family history includes Blindness in her sister; Cancer in her brother and brother; Colon cancer in her brother; Congestive Heart Failure in her sister; Heart disease in her father; Hypertension in her father and sister; Thyroid disease in her sister. There is no history of Diabetes, Prostate cancer, or Breast cancer.    Physical Exam: Patient Vitals for the past 24 hrs:  BP Temp Temp src Pulse Resp SpO2 Height Weight  03/07/16 2215 127/66 mmHg - - 62 16 96 % - -  03/07/16 2145 134/73 mmHg - - 62 13 97 % - -  03/07/16 2136 141/75 mmHg 98.5 F (36.9 C) Oral 67 13 98 % - -  03/07/16 1839 127/86 mmHg 97.9 F (36.6 C) Oral 75 20 98 % 5\' 4"  (1.626 m) 90.901 kg (200 lb 6.4 oz)    1. General:  in No Acute distress 2. Psychological: Alert and  Oriented 3. Head/ENT:   Dry Mucous Membranes                          Head Non traumatic, neck supple                          Normal   Dentition 4. SKIN:  decreased Skin turgor,  Skin clean Dry and intact no rash 5. Heart: Regular rate and rhythm no  Murmur, Rub or gallop 6. Lungs:  Clear to auscultation bilaterally, no wheezes or crackles   7. Abdomen: Soft, non-tender, Non distended 8. Lower extremities: no clubbing, cyanosis, or edema 9. Neurologically Grossly intact, moving all 4 extremities equally 10. MSK: Normal range of motion  body mass index is 34.38 kg/(m^2).   Labs on Admission:   Results for orders placed or performed during the hospital encounter of 03/07/16 (from the past 24 hour(s))  Basic metabolic panel     Status: Abnormal   Collection Time: 03/07/16  6:41 PM  Result Value Ref Range   Sodium 143 135 - 145 mmol/L   Potassium 4.1 3.5 - 5.1 mmol/L   Chloride  103 101 - 111 mmol/L   CO2 27 22 - 32 mmol/L   Glucose, Bld 108 (H) 65 - 99 mg/dL   BUN 22 (H) 6 - 20 mg/dL   Creatinine,  Ser 1.24 (H) 0.44 - 1.00 mg/dL   Calcium 8.4 (L) 8.9 - 10.3 mg/dL   GFR calc non Af Amer 43 (L) >60 mL/min   GFR calc Af Amer 50 (L) >60 mL/min   Anion gap 13 5 - 15  CBC     Status: Abnormal   Collection Time: 03/07/16  6:41 PM  Result Value Ref Range   WBC 11.0 (H) 4.0 - 10.5 K/uL   RBC 4.66 3.87 - 5.11 MIL/uL   Hemoglobin 13.2 12.0 - 15.0 g/dL   HCT 40.7 36.0 - 46.0 %   MCV 87.3 78.0 - 100.0 fL   MCH 28.3 26.0 - 34.0 pg   MCHC 32.4 30.0 - 36.0 g/dL   RDW 14.6 11.5 - 15.5 %   Platelets 246 150 - 400 K/uL  Protime-INR - (order if Patient is taking Coumadin / Warfarin)     Status: None   Collection Time: 03/07/16  6:41 PM  Result Value Ref Range   Prothrombin Time 12.0 11.6 - 15.2 seconds   INR 0.86 0.00 - 1.49  I-stat troponin, ED (not at [facility], [facility])     Status: None   Collection Time: 03/07/16  6:47 PM  Result Value Ref Range   Troponin i, poc 0.01 0.00 - 0.08 ng/mL   Comment 3            UA not  obtained  No results found for: HGBA1C  Estimated Creatinine Clearance: 46.8 mL/min (by C-G formula based on Cr of 1.24).  BNP (last 3 results) No results for input(s): PROBNP in the last 8760 hours.  Other results:  I have pearsonaly reviewed this: ECG REPORT  Rate: 77  Rhythm: Sinus rhythm ST&T Change: No acute ischemic changes QTC 477  Filed Weights   03/07/16 1839  Weight: 90.901 kg (200 lb 6.4 oz)     Cultures: No results found for: SDES, SPECREQUEST, CULT, REPTSTATUS   Radiological Exams on Admission: Dg Chest 2 View  03/07/2016  CLINICAL DATA:  Acute onset of generalized chest pain and shortness of breath. Initial encounter. EXAM: CHEST  2 VIEW COMPARISON:  Chest radiograph performed 09/26/2015 FINDINGS: The lungs are well-aerated. Minimal bibasilar atelectasis is noted. There is no evidence of pleural effusion or pneumothorax. The  heart is borderline normal in size. No acute osseous abnormalities are seen. IMPRESSION: Minimal bibasilar atelectasis noted.  Lungs otherwise clear. Electronically Signed   By: Garald Balding M.D.   On: 03/07/2016 19:08    Chart has been reviewed  Family   at  Bedside   Assessment/Plan  This 39-year-old female history of coronary artery disease presents with chest pain so far cardiac enzymes negative EKG was had acute changes will admit for further evaluation with cardiology consult in the morning  Present on Admission:  . Chest pain - - given risk factors will admit, monitor on telemetry, cycle cardiac enzymes, obtain serial ECG. Further risk stratify with lipid panel, hgA1C, obtain TSH. Make sure patient is on Aspirin. Further treatment based on the currently pending results.  . CAD, NATIVE VESSEL T home medications including aspirin and statin, Plavix and Imdur, appreciate cardiology consult in AM Hypertension continue home medicaitons Prophylaxis:  Lovenox   CODE STATUS:  FULL CODE  as per patient   Disposition:  To home once workup is complete and patient is stable  Other plan as per orders.  I have spent a total of 26min on this admission    Willys Salvino 03/07/2016, 10:51 PM  [facility]  Pager [phone]   after 2 AM please page floor coverage PA If 7AM-7PM, please contact the day team taking care of the patient  [email or web address]  Password [email or web address]

## 2016-03-07 NOTE — ED Notes (Signed)
Admitting MD at the bedside.  

## 2016-03-07 NOTE — ED Notes (Signed)
Pt reports onset one hour ago of chest heaviness and its radiating into left arm. Also reports increase in belching. Pt has cardiac hx, took 1 nitro pta.

## 2016-03-08 ENCOUNTER — Encounter (HOSPITAL_COMMUNITY): Payer: Self-pay | Admitting: Nurse Practitioner

## 2016-03-08 ENCOUNTER — Encounter (HOSPITAL_COMMUNITY)
Admission: EM | Disposition: A | Discharge status: Home / Self Care | Payer: Self-pay | Source: Home / Self Care | Attending: Internal Medicine

## 2016-03-08 ENCOUNTER — Observation Stay (HOSPITAL_BASED_OUTPATIENT_CLINIC_OR_DEPARTMENT_OTHER): Payer: Medicare Other

## 2016-03-08 DIAGNOSIS — E039 Hypothyroidism, unspecified: Secondary | ICD-10-CM | POA: Diagnosis not present

## 2016-03-08 DIAGNOSIS — I25110 Atherosclerotic heart disease of native coronary artery with unstable angina pectoris: Secondary | ICD-10-CM | POA: Diagnosis not present

## 2016-03-08 DIAGNOSIS — E890 Postprocedural hypothyroidism: Secondary | ICD-10-CM

## 2016-03-08 DIAGNOSIS — E785 Hyperlipidemia, unspecified: Secondary | ICD-10-CM | POA: Diagnosis not present

## 2016-03-08 DIAGNOSIS — I200 Unstable angina: Secondary | ICD-10-CM

## 2016-03-08 DIAGNOSIS — I252 Old myocardial infarction: Secondary | ICD-10-CM | POA: Diagnosis not present

## 2016-03-08 DIAGNOSIS — E209 Hypoparathyroidism, unspecified: Secondary | ICD-10-CM

## 2016-03-08 DIAGNOSIS — I10 Essential (primary) hypertension: Secondary | ICD-10-CM | POA: Diagnosis not present

## 2016-03-08 DIAGNOSIS — I209 Angina pectoris, unspecified: Secondary | ICD-10-CM

## 2016-03-08 DIAGNOSIS — R079 Chest pain, unspecified: Secondary | ICD-10-CM

## 2016-03-08 DIAGNOSIS — E208 Other hypoparathyroidism: Secondary | ICD-10-CM

## 2016-03-08 HISTORY — PX: CARDIAC CATHETERIZATION: SHX172

## 2016-03-08 LAB — TROPONIN I: Troponin I: <0.03 ng/mL (ref <0.031)

## 2016-03-08 LAB — BASIC METABOLIC PANEL
ANION GAP: 13 (ref 5–15)
BUN: 15 mg/dL (ref 6–20)
CALCIUM: 8.3 mg/dL — AB (ref 8.9–10.3)
CHLORIDE: 104 mmol/L (ref 101–111)
CO2: 28 mmol/L (ref 22–32)
Creatinine, Ser: 1.05 mg/dL — ABNORMAL HIGH (ref 0.44–1.00)
GFR calc non Af Amer: 53 mL/min — ABNORMAL LOW (ref >60)
GLUCOSE: 103 mg/dL — AB (ref 65–99)
POTASSIUM: 3.8 mmol/L (ref 3.5–5.1)
Sodium: 145 mmol/L (ref 135–145)

## 2016-03-08 LAB — ECHOCARDIOGRAM COMPLETE
HEIGHTINCHES: 64 in
Weight: 3163.2 oz

## 2016-03-08 SURGERY — LEFT HEART CATH AND CORONARY ANGIOGRAPHY

## 2016-03-08 MED ORDER — CLOPIDOGREL BISULFATE 75 MG PO TABS
75.0000 mg | ORAL_TABLET | Freq: Every day | ORAL | Status: DC
  Administered 2016-03-08: 75 mg via ORAL
  Filled 2016-03-08 (×2): qty 1

## 2016-03-08 MED ORDER — HEPARIN (PORCINE) IN NACL 2-0.9 UNIT/ML-% IJ SOLN
INTRAMUSCULAR | Status: DC | PRN
  Administered 2016-03-08: 1000 mL

## 2016-03-08 MED ORDER — ROSUVASTATIN CALCIUM 10 MG PO TABS
20.0000 mg | ORAL_TABLET | Freq: Every day | ORAL | Status: DC
Start: 2016-03-08 — End: 2016-03-08
  Filled 2016-03-08: qty 2

## 2016-03-08 MED ORDER — SODIUM CHLORIDE 0.9 % WEIGHT BASED INFUSION
1.0000 mL/kg/h | INTRAVENOUS | Status: AC
  Administered 2016-03-08: 1 mL/kg/h via INTRAVENOUS

## 2016-03-08 MED ORDER — ACETAMINOPHEN 325 MG PO TABS: 650.0000 mg | ORAL_TABLET | ORAL | Status: DC | PRN

## 2016-03-08 MED ORDER — DIAZEPAM 5 MG PO TABS: 5.0000 mg | ORAL_TABLET | Freq: Every evening | ORAL | Status: DC | PRN

## 2016-03-08 MED ORDER — MORPHINE SULFATE (PF) 2 MG/ML IV SOLN: 2.0000 mg | INTRAVENOUS | Status: DC | PRN

## 2016-03-08 MED ORDER — HEPARIN SODIUM (PORCINE) 1000 UNIT/ML IJ SOLN
INTRAMUSCULAR | Status: AC
Start: 2016-03-08 — End: 2016-03-08
  Filled 2016-03-08: qty 1

## 2016-03-08 MED ORDER — ENOXAPARIN SODIUM 40 MG/0.4ML ~~LOC~~ SOLN: 40.0000 mg | SUBCUTANEOUS | Status: DC

## 2016-03-08 MED ORDER — SODIUM CHLORIDE 0.9% FLUSH: 3.0000 mL | INTRAVENOUS | Status: DC | PRN

## 2016-03-08 MED ORDER — CALCIUM CARBONATE ANTACID 500 MG PO CHEW: 4.0000 | CHEWABLE_TABLET | Freq: Two times a day (BID) | ORAL | Status: DC

## 2016-03-08 MED ORDER — SODIUM CHLORIDE 0.9 % WEIGHT BASED INFUSION: 3.0000 mL/kg/h | INTRAVENOUS | Status: DC

## 2016-03-08 MED ORDER — LIDOCAINE HCL (PF) 1 % IJ SOLN
INTRAMUSCULAR | Status: DC | PRN
  Administered 2016-03-08: 3 mL

## 2016-03-08 MED ORDER — CALCITRIOL 0.25 MCG PO CAPS
0.7500 ug | ORAL_CAPSULE | Freq: Every day | ORAL | Status: DC
  Filled 2016-03-08: qty 1

## 2016-03-08 MED ORDER — LIDOCAINE HCL (PF) 1 % IJ SOLN
INTRAMUSCULAR | Status: AC
  Filled 2016-03-08: qty 30

## 2016-03-08 MED ORDER — FENTANYL CITRATE (PF) 100 MCG/2ML IJ SOLN
INTRAMUSCULAR | Status: AC
  Filled 2016-03-08: qty 2

## 2016-03-08 MED ORDER — ENOXAPARIN SODIUM 40 MG/0.4ML ~~LOC~~ SOLN
40.0000 mg | SUBCUTANEOUS | Status: DC
  Filled 2016-03-08 (×2): qty 0.4

## 2016-03-08 MED ORDER — SODIUM CHLORIDE 0.9 % IV SOLN: 250.0000 mL | INTRAVENOUS | Status: DC | PRN

## 2016-03-08 MED ORDER — LEVOTHYROXINE SODIUM 100 MCG PO TABS
100.0000 ug | ORAL_TABLET | Freq: Every day | ORAL | Status: DC
  Administered 2016-03-08: 100 ug via ORAL
  Filled 2016-03-08: qty 1

## 2016-03-08 MED ORDER — HEPARIN SODIUM (PORCINE) 1000 UNIT/ML IJ SOLN
INTRAMUSCULAR | Status: DC | PRN
  Administered 2016-03-08: 4500 [IU] via INTRAVENOUS

## 2016-03-08 MED ORDER — IOHEXOL 350 MG/ML SOLN
INTRAVENOUS | Status: DC | PRN
  Administered 2016-03-08: 80 mL via INTRA_ARTERIAL

## 2016-03-08 MED ORDER — SODIUM CHLORIDE 0.9% FLUSH: 3.0000 mL | Freq: Two times a day (BID) | INTRAVENOUS | Status: DC

## 2016-03-08 MED ORDER — MIDAZOLAM HCL 2 MG/2ML IJ SOLN
INTRAMUSCULAR | Status: DC | PRN
  Administered 2016-03-08: 2 mg via INTRAVENOUS

## 2016-03-08 MED ORDER — HEPARIN (PORCINE) IN NACL 2-0.9 UNIT/ML-% IJ SOLN
INTRAMUSCULAR | Status: AC
  Filled 2016-03-08: qty 1000

## 2016-03-08 MED ORDER — VITAMIN D 1000 UNITS PO TABS: 1000.0000 [IU] | ORAL_TABLET | Freq: Every day | ORAL | Status: DC

## 2016-03-08 MED ORDER — SODIUM CHLORIDE 0.9 % WEIGHT BASED INFUSION
1.0000 mL/kg/h | INTRAVENOUS | Status: DC
  Administered 2016-03-08: 1 mL via INTRAVENOUS

## 2016-03-08 MED ORDER — GABAPENTIN 300 MG PO CAPS
300.0000 mg | ORAL_CAPSULE | Freq: Three times a day (TID) | ORAL | Status: DC
  Administered 2016-03-08: 300 mg via ORAL
  Filled 2016-03-08 (×2): qty 1

## 2016-03-08 MED ORDER — FAMOTIDINE 20 MG PO TABS
20.0000 mg | ORAL_TABLET | Freq: Every day | ORAL | Status: DC
  Filled 2016-03-08: qty 1

## 2016-03-08 MED ORDER — ONDANSETRON HCL 4 MG/2ML IJ SOLN: 4.0000 mg | Freq: Four times a day (QID) | INTRAMUSCULAR | Status: DC | PRN

## 2016-03-08 MED ORDER — MIDAZOLAM HCL 2 MG/2ML IJ SOLN
INTRAMUSCULAR | Status: AC
  Filled 2016-03-08: qty 2

## 2016-03-08 MED ORDER — VERAPAMIL HCL 2.5 MG/ML IV SOLN
INTRAVENOUS | Status: AC
  Filled 2016-03-08: qty 2

## 2016-03-08 MED ORDER — PANTOPRAZOLE SODIUM 40 MG PO TBEC: 40.0000 mg | DELAYED_RELEASE_TABLET | Freq: Every day | ORAL | Status: DC

## 2016-03-08 MED ORDER — ALPRAZOLAM 0.25 MG PO TABS: 0.2500 mg | ORAL_TABLET | Freq: Two times a day (BID) | ORAL | Status: DC | PRN

## 2016-03-08 MED ORDER — AMLODIPINE BESYLATE 10 MG PO TABS
10.0000 mg | ORAL_TABLET | Freq: Every day | ORAL | Status: DC
  Filled 2016-03-08: qty 1

## 2016-03-08 MED ORDER — ASPIRIN EC 81 MG PO TBEC
81.0000 mg | DELAYED_RELEASE_TABLET | Freq: Every day | ORAL | Status: DC
  Administered 2016-03-08: 81 mg via ORAL
  Filled 2016-03-08: qty 1

## 2016-03-08 MED ORDER — ROSUVASTATIN CALCIUM 10 MG PO TABS: 40.0000 mg | ORAL_TABLET | Freq: Every day | ORAL | Status: DC

## 2016-03-08 MED ORDER — VERAPAMIL HCL 2.5 MG/ML IV SOLN
INTRAVENOUS | Status: DC | PRN
  Administered 2016-03-08: 1 mL via INTRA_ARTERIAL

## 2016-03-08 MED ORDER — ROSUVASTATIN CALCIUM 40 MG PO TABS: 40.0000 mg | ORAL_TABLET | Freq: Every day | ORAL | Status: DC

## 2016-03-08 MED ORDER — ROSUVASTATIN CALCIUM 20 MG PO TABS: 20.0000 mg | ORAL_TABLET | Freq: Every day | ORAL | Status: DC

## 2016-03-08 MED ORDER — FENTANYL CITRATE (PF) 100 MCG/2ML IJ SOLN
INTRAMUSCULAR | Status: DC | PRN
  Administered 2016-03-08: 25 ug via INTRAVENOUS

## 2016-03-08 MED ORDER — ISOSORBIDE MONONITRATE ER 60 MG PO TB24
90.0000 mg | ORAL_TABLET | Freq: Every day | ORAL | Status: DC
  Administered 2016-03-08: 90 mg via ORAL
  Filled 2016-03-08: qty 1

## 2016-03-08 SURGICAL SUPPLY — 11 items
CATH INFINITI 5 FR JL3.5 (CATHETERS) ×2 IMPLANT
CATH INFINITI 5FR ANG PIGTAIL (CATHETERS) ×3 IMPLANT
CATH INFINITI JR4 5F (CATHETERS) ×3 IMPLANT
DEVICE RAD COMP TR BAND LRG (VASCULAR PRODUCTS) ×3 IMPLANT
GLIDESHEATH SLEND SS 6F .021 (SHEATH) ×3 IMPLANT
KIT HEART LEFT (KITS) ×3 IMPLANT
PACK CARDIAC CATHETERIZATION (CUSTOM PROCEDURE TRAY) ×3 IMPLANT
SYR MEDRAD MARK V 150ML (SYRINGE) ×3 IMPLANT
TRANSDUCER W/STOPCOCK (MISCELLANEOUS) ×3 IMPLANT
TUBING CIL FLEX 10 FLL-RA (TUBING) ×3 IMPLANT
WIRE SAFE-T 1.5MM-J .035X260CM (WIRE) ×3 IMPLANT

## 2016-03-08 NOTE — Progress Notes (Signed)
[facility] PROGRESS NOTE  [name] [id number] DOB: 01/20/1946 DOA: 03/07/2016 PCP: Penni Homans, MD  HPI Presented with crushing chest pain and heaviness x 4 hours that was associated with some increase in burping, nausea and some SOB. Began while sitting on the couch. Patient took 1 ntg without much improvement - her angina usually improves with ntg.  Subjective: Patient asymptomatic laying in bed.   Assessment/Plan: Primary Problem Unstable Angina: Typical features concerning for an unstable angina. Currently asymptomatic. Troponin x 2: negative. EKG without acute changes. Cardiology consulted. Based on this patient's extensive cardiac history, a diagnostic cath planned for this afternoon. NPO for now - IVF prior to cath in the setting of mildly increased SCr (1.05). Continue ASA, statin, prn morphine.  Active Problems   CAD, NATIVE VESSEL: NSTEMI 10/2011 w/LHC 11/19/11 - 2 stents placed. Continue home medications: ASA, Plavix, statin, Imdur. Await catheterization this afternoon.  Hypertension:controlled-continue  Amlodipine, Imdur,   Post-operative hypothyroidism:continue Synthroid  Posit operative hypoparathyroidism: resume calcium supplements, continue calcitriol  Hyperlipidemia: Lipid panel 01/23/2016 WNL. Cardiology increased Crestor dose to 40mg . Continue.  GERD: Continue Pepcid and Protonix  Code Status: Full Family Communication: None at bedside Disposition Plan: Home when ready DVT Prophylaxis: Lovenox  Consultants:  Cardiology  Procedures:  Echocardiogram 03/08/2016  Antibiotics:  None    Objective: Filed Vitals:   03/08/16 0108 03/08/16 0456  BP: 152/64 134/54  Pulse: 70 61  Temp: 98.2 F (36.8 C) 97.6 F (36.4 C)  Resp: 15 16   No intake or output data in the 24 hours ending 03/08/16 1154 Filed Weights   03/07/16 1839 03/08/16 0108  Weight: 90.901 kg (200 lb 6.4 oz) 89.676 kg (197 lb 11.2 oz)    Exam:  General: WDWN, in  NAD.  Cardiovascular: RRR no C/M/R/G. No edema.  Respiratory: CTABL no adventitious breath sounds.  Abdomen: Soft, NT, ND, +BS  Musculoskeletal: Moves all limbs against gravity.   Data Reviewed: Basic Metabolic Panel:  Recent Labs Lab 03/07/16 1841 03/08/16 1050  NA 143 145  K 4.1 3.8  CL 103 104  CO2 27 28  GLUCOSE 108* 103*  BUN 22* 15  CREATININE 1.24* 1.05*  CALCIUM 8.4* 8.3*   Liver Function Tests: No results for input(s): AST, ALT, ALKPHOS, BILITOT, PROT, ALBUMIN in the last 168 hours. No results for input(s): LIPASE, AMYLASE in the last 168 hours. No results for input(s): AMMONIA in the last 168 hours. CBC:  Recent Labs Lab 03/07/16 1841  WBC 11.0*  HGB 13.2  HCT 40.7  MCV 87.3  PLT 246   Cardiac Enzymes:  Recent Labs Lab 03/08/16 0316 03/08/16 0715  TROPONINI <0.03 <0.03   BNP (last 3 results) No results for input(s): BNP in the last 8760 hours.  ProBNP (last 3 results) No results for input(s): PROBNP in the last 8760 hours.  CBG: No results for input(s): GLUCAP in the last 168 hours.  No results found for this or any previous visit (from the past 240 hour(s)).   Studies: Dg Chest 2 View  03/07/2016  CLINICAL DATA:  Acute onset of generalized chest pain and shortness of breath. Initial encounter. EXAM: CHEST  2 VIEW COMPARISON:  Chest radiograph performed 09/26/2015 FINDINGS: The lungs are well-aerated. Minimal bibasilar atelectasis is noted. There is no evidence of pleural effusion or pneumothorax. The heart is borderline normal in size. No acute osseous abnormalities are seen. IMPRESSION: Minimal bibasilar atelectasis noted.  Lungs otherwise clear. Electronically Signed   By: Garald Balding  M.D.   On: 03/07/2016 19:08    Scheduled Meds: . amLODipine  10 mg Oral Daily  . aspirin EC  81 mg Oral Daily  . calcitRIOL  0.75 mcg Oral Daily  . clopidogrel  75 mg Oral Daily  . enoxaparin (LOVENOX) injection  40 mg Subcutaneous Q24H  . famotidine   20 mg Oral Daily  . gabapentin  300 mg Oral TID  . isosorbide mononitrate  90 mg Oral Daily  . levothyroxine  100 mcg Oral QAC breakfast  . pantoprazole  40 mg Oral Q1200  . [START ON 03/09/2016] rosuvastatin  40 mg Oral Daily  . sodium chloride flush  3 mL Intravenous Q12H   Continuous Infusions: . sodium chloride     Followed by  . sodium chloride      Active Problems:   CAD, NATIVE VESSEL   Chest pain   Time spent 30 minutes-Greater than 50% of this time was spent in counseling, explanation of diagnosis, planning of further management, and coordination of care.  [name]  [facility]  If 7PM-7AM, please contact night-coverage at [email or web address], password [email or web address] 03/08/2016, 11:54 AM  LOS: 0 days    Attending MD note  Patient was seen, examined,treatment plan was discussed with the PA-S.  I have personally reviewed the clinical findings, lab, imaging studies and management of this patient in detail. I agree with the documentation, as recorded by the PA-S.   Patient with known CAD-admitted with typical chest pain-concerning for Unstable Angiona. EKG/Trop negative. Cards consulted-plans are for LHC today  Rest as above  [facility]

## 2016-03-08 NOTE — Consult Note (Signed)
Cardiology Consult    Patient ID: [name] MRN: [id number], DOB/AGE: Jul 25, 1946   Admit date: 03/07/2016 Date of Consult: 03/08/2016  Primary Physician: Penni Homans, MD Primary Cardiologist: Lizbeth Bark, MD  Requesting Provider: A. [place]  Patient Profile    70 y/o w/ a h/o CAD, admitted 03/07/16 after onset of 7/10 substernal heaviness occuring at rest, unrelieved with 1 nitro sl at home, lasting for 4 hours.    Past Medical History   Past Medical History  Diagnosis Date  . GERD (gastroesophageal reflux disease)     occasional  . Hyperlipidemia   . Hypertensive heart disease     a. Echocardiogram 11/19/11: Difficult acoustic windows, EF 60-65%, normal LV wall thickness, grade 1 diastolic dysfunction  . Vertigo   . Tubular adenoma of colon 06/2011  . Colitis   . Multinodular thyroid     Goiter s/p thyroidectomy in 2007 with post-op  hypocalcemia and post-op hypothyroidism/hypoparathyroidism with hypocalcemia  . CAD (coronary artery disease)     a. NSTEMI 10/2011: [place] 11/19/11: pLAD 30%, oOM 60%, AVCFX 30%, CFX after OM2 30%, pRCA 60 and 70%, then 99%, AM 80-90% with TIMI 3 flow.  PCI: Promus DES x 2 to RCA; b. 06/2012 Cath: patent RCA stents w/ subtl occl of Acute Marginal (jailed)->Med rx; c. 05/2015 MV: EF 59%, mod mid infsept/inf/ap lat/ap infarct with peri-infarct isch-->Med Rx.  Marland Kitchen Post-surgical hypothyroidism   . Hypoparathyroidism ([place])   . Palpitations     a. 03/2012 - patient set up for event monitor but did not wear correctly -  she declined wearing a repeat monitor  . Unspecified constipation 05/31/2013  . Back pain 05/31/2013  . Nocturia 05/31/2013  . Neck pain on right side 07/13/2013  . Sun-damaged skin 12/05/2014  . Zinc deficiency 11/26/2015    Past Surgical History  Procedure Laterality Date  . Colonoscopy  Aenomatous polyps    07/05/2011  . Total thyroidectomy  2007    GOITER  . Shoulder arthroscopy w/ rotator cuff repair      right  . Coronary  angioplasty with stent placement    . Colonoscopy N/A 09/28/2014    Procedure: COLONOSCOPY;  Surgeon: Ladene Artist, MD;  Location: [facility];  Service: Endoscopy;  Laterality: N/A;  . Percutaneous coronary stent intervention (pci-s) N/A 11/19/2011    Procedure: PERCUTANEOUS CORONARY STENT INTERVENTION (PCI-S);  Surgeon: Hillary Bow, MD;  Location: [facility] CATH LAB;  Service: Cardiovascular;  Laterality: N/A;  . Left heart catheterization with coronary angiogram N/A 07/17/2012    Procedure: LEFT HEART CATHETERIZATION WITH CORONARY ANGIOGRAM;  Surgeon: Hillary Bow, MD;  Location: [facility] CATH LAB;  Service: Cardiovascular;  Laterality: N/A;     Allergies  Allergies  Allergen Reactions  . Zetia [Ezetimibe] Other (See Comments)    Myalgia, paresthesias and weakness    History of Present Illness    44 y/of with PMH significant for GERD, vertigo, colitis, post-surgical hypothyroidism, hypoparathyroidism, HLD, HTN, grade 1 diastolic dysfunction, and CAD s/p  NSTEMI 10/2011 with DES x 2 to RCA w/normal EF.  Underwent repeat LHC on 07/17/12 with patent RCA stent w/subtotal occulsion of acute marginal,which was jailed.  She has been medically managed since.  Since at least 05/2015, she has been having intermittent chest discomfort, typically occurring @ rest, w/o assoc Ss, lasting 5-10 mins, and resolving with sl NTG.  In 05/2015, she underwent stress testing, which revealed an EF 59% w/mod mid inferoseptal, mid inferior, apical lateral, and apex location  c/w scar and peri-infarct ischemia w/recommended medical management.  She has seen Dr. Harrington Challenger several times since then and each time c/o mild, chronic, nitrate responsive c/p, which ultimately was not felt to be ischemic related.  She was last seen in the office by Dr. Harrington Challenger on 01/23/16 for f/u and reported occasion chest heaviness not associated with activity. No changes made at that time.  She is on plavix, aspirin, CCB, and long-acting nitrate.  On  03/08/15, she was sitting on the couch and had onset of substernal "heaviness" in her chest with associated left bicep pain, maximal intensity 7/10.  No dizziness, sob, n/v, diaphoresis, or peripheral edema.  She took 1 nitro sl at home without change and when it did not ease up she went to the ED.  Pain lasted 4 hours and resolved spontaneously in ED.  Reports this pain is different from her prior angina episodes in that her normal angina is usually relieved with nitro sl and it is brief.  Denies any recent increase in angina episodes, at best occuring once or every other week without provocation.    ED found unchanged non-acute ECG, normal CXR, negative istat troponin x 2, Cr 1.24 (slighlty up from 1.03 01/23/16), Bun 22, WBC 11, H/H 13.2/40.7, and plts 246.  She was admitted to telemetry and has had no recurrent c/p.  Inpatient Medications    . amLODipine  10 mg Oral Daily  . aspirin EC  81 mg Oral Daily  . calcitRIOL  0.75 mcg Oral Daily  . clopidogrel  75 mg Oral Daily  . enoxaparin (LOVENOX) injection  40 mg Subcutaneous Q24H  . famotidine  20 mg Oral Daily  . gabapentin  300 mg Oral TID  . isosorbide mononitrate  90 mg Oral Daily  . levothyroxine  100 mcg Oral QAC breakfast  . pantoprazole  40 mg Oral Q1200  . rosuvastatin  20 mg Oral Daily    Family History    Family History  Problem Relation Age of Onset  . Colon cancer Brother   . Cancer Brother     COLON  . Hypertension Father   . Heart disease Father   . Diabetes Neg Hx   . Prostate cancer Neg Hx   . Breast cancer Neg Hx   . Blindness Sister   . Congestive Heart Failure Sister   . Hypertension Sister   . Thyroid disease Sister   . Cancer Brother     multiple myelomas    Social History    Social History   Social History  . Marital Status: Married    Spouse Name: N/A  . Number of Children: 4  . Years of Education: 14   Occupational History  . 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 Smoking  status: Never Smoker   . Smokeless tobacco: Never Used  . Alcohol Use: No  . Drug Use: No  . Sexual Activity: Not on file   Other Topics Concern  . Not on file   Social History Narrative   Lives at home alone.  Her son lives near her.   Right-handed.   1 cup coffee per day.     Review of Systems    General:  No chills, fever, night sweats or weight changes.  Cardiovascular:  +++ chest pain, no dyspnea on exertion, edema, orthopnea, palpitations, paroxysmal nocturnal dyspnea. Dermatological: No rash, lesions/masses Respiratory: No cough, dyspnea Urologic: No hematuria, dysuria Abdominal:   No nausea, vomiting,  diarrhea, bright red blood per rectum, melena, or hematemesis Neurologic:  No visual changes, wkns, changes in mental status. All other systems reviewed and are otherwise negative except as noted above.  Physical Exam    Blood pressure 134/54, pulse 61, temperature 97.6 F (36.4 C), temperature source Oral, resp. rate 16, height 5\' 4"  (1.626 m), weight 197 lb 11.2 oz (89.676 kg), SpO2 97 %.  General: Pleasant, well nourished adult female NAD Psych: Normal affect. Neuro: Alert and oriented X 3. Moves all extremities spontaneously. HEENT: Normal  Neck: Supple without bruits or JVD. Lungs:  Resp regular and unlabored, CTA. Heart: RRR no s3, s4, or murmurs. Abdomen: Soft, non-tender, non-distended, BS + x 4.  Extremities: No clubbing, cyanosis or edema. DP/PT/Radials 2+ and equal bilaterally.  Labs    Troponin [facility] of Care Test)  Recent Labs  03/07/16 2321  TROPIPOC 0.01    Recent Labs  03/08/16 0316 03/08/16 0715  TROPONINI <0.03 <0.03   Lab Results  Component Value Date   WBC 11.0* 03/07/2016   HGB 13.2 03/07/2016   HCT 40.7 03/07/2016   MCV 87.3 03/07/2016   PLT 246 03/07/2016     Recent Labs Lab 03/07/16 1841  NA 143  K 4.1  CL 103  CO2 27  BUN 22*  CREATININE 1.24*  CALCIUM 8.4*  GLUCOSE 108*   Lab Results  Component Value Date    CHOL 171 01/23/2016   HDL 48 01/23/2016   LDLCALC 101 01/23/2016   TRIG 108 01/23/2016    Radiology Studies    Dg Chest 2 View  03/07/2016  CLINICAL DATA:  Acute onset of generalized chest pain and shortness of breath. Initial encounter. EXAM: CHEST  2 VIEW COMPARISON:  Chest radiograph performed 09/26/2015 FINDINGS: The lungs are well-aerated. Minimal bibasilar atelectasis is noted. There is no evidence of pleural effusion or pneumothorax. The heart is borderline normal in size. No acute osseous abnormalities are seen. IMPRESSION: Minimal bibasilar atelectasis noted.  Lungs otherwise clear. Electronically Signed   By: Garald Balding M.D.   On: 03/07/2016 19:08    ECG & Cardiac Imaging    RSR, 60, no acute ST/T changes.  Assessment & Plan    1.  Unstable Angina/CAD:  Pt has a h/o NSTEMI with RCA stenting in 2012, resulting in a jailed acute marginal branch.  She also had moderate LAD, LCX, and OM2 dzs.  She has been having intermittent c/p since at least 05/2015 with low risk MV @ that time (mod mid infsept/inf/ap lat/ap infarct with peri-infarct isch).  She has been medically managed since and has continued to have intermittent, nitrate responsive c/p.  She was admitted 3/15 w/ a prolonged episode of worsening c/p and pressure.  This resolved after ~ 4 hrs. She has been pain free since.  Trop neg. ECG non-acute.  Given prolonged h/o intermittent c/p and worsening of Ss yesterday, we will plan on diagnostic catheterization this afternoon.  The patient understands that risks include but are not limited to stroke (1 in 1000), death (1 in 60), kidney failure [usually temporary] (1 in 500), bleeding (1 in 200), allergic reaction [possibly serious] (1 in 200), and agrees to proceed.  Cont asa, statin, nitrate, ccb.  No  blocker 2/2 baseline relative bradycardia.  2.  Mild Renal Insuff: hydrate pre-cath.  3.  Hypertensive Heart Dzs:  Stable on ccb/nitrate.  4.  HL:  On crestor 20  LDL 101 in  January.  Increase crestor to 40.  Nl lft's  in 10/2015.  5.  Hypothyroidism w/ iatrogenic hyperthyroidism:  TSH 8 in 10/2015, 0.60 in 12/2015.  Followed by PCP as outpt.  Murray Hodgkins, NP 03/08/2016, 11:31 AM

## 2016-03-08 NOTE — H&P (View-Only) (Signed)
Cardiology Consult    Patient ID: NAMEERA ABANTO MRN: [id number], DOB/AGE: [birth year]   Admit date: 03/07/2016 Date of Consult: 03/08/2016  Primary Physician: Penni Homans, MD Primary Cardiologist: Lizbeth Bark, MD  Requesting Provider: A. [place]  Patient Profile    70 y/o w/ a h/o CAD, admitted 03/07/16 after onset of 7/10 substernal heaviness occuring at rest, unrelieved with 1 nitro sl at home, lasting for 4 hours.    Past Medical History   Past Medical History  Diagnosis Date  . GERD (gastroesophageal reflux disease)     occasional  . Hyperlipidemia   . Hypertensive heart disease     a. Echocardiogram 11/19/11: Difficult acoustic windows, EF 60-65%, normal LV wall thickness, grade 1 diastolic dysfunction  . Vertigo   . Tubular adenoma of colon 06/2011  . Colitis   . Multinodular thyroid     Goiter s/p thyroidectomy in 2007 with post-op  hypocalcemia and post-op hypothyroidism/hypoparathyroidism with hypocalcemia  . CAD (coronary artery disease)     a. NSTEMI 10/2011: [place] 11/19/11: pLAD 30%, oOM 60%, AVCFX 30%, CFX after OM2 30%, pRCA 60 and 70%, then 99%, AM 80-90% with TIMI 3 flow.  PCI: Promus DES x 2 to RCA; b. 06/2012 Cath: patent RCA stents w/ subtl occl of Acute Marginal (jailed)->Med rx; c. 05/2015 MV: EF 59%, mod mid infsept/inf/ap lat/ap infarct with peri-infarct isch-->Med Rx.  Marland Kitchen Post-surgical hypothyroidism   . Hypoparathyroidism ([place])   . Palpitations     a. 03/2012 - patient set up for event monitor but did not wear correctly -  she declined wearing a repeat monitor  . Unspecified constipation 05/31/2013  . Back pain 05/31/2013  . Nocturia 05/31/2013  . Neck pain on right side 07/13/2013  . Sun-damaged skin 12/05/2014  . Zinc deficiency 11/26/2015    Past Surgical History  Procedure Laterality Date  . Colonoscopy  Aenomatous polyps    07/05/2011  . Total thyroidectomy  2007    GOITER  . Shoulder arthroscopy w/ rotator cuff repair      right  . Coronary  angioplasty with stent placement    . Colonoscopy N/A 09/28/2014    Procedure: COLONOSCOPY;  Surgeon: Ladene Artist, MD;  Location: [facility];  Service: Endoscopy;  Laterality: N/A;  . Percutaneous coronary stent intervention (pci-s) N/A 11/19/2011    Procedure: PERCUTANEOUS CORONARY STENT INTERVENTION (PCI-S);  Surgeon: Hillary Bow, MD;  Location: [facility] CATH LAB;  Service: Cardiovascular;  Laterality: N/A;  . Left heart catheterization with coronary angiogram N/A 07/17/2012    Procedure: LEFT HEART CATHETERIZATION WITH CORONARY ANGIOGRAM;  Surgeon: Hillary Bow, MD;  Location: [facility] CATH LAB;  Service: Cardiovascular;  Laterality: N/A;     Allergies  Allergies  Allergen Reactions  . Zetia [Ezetimibe] Other (See Comments)    Myalgia, paresthesias and weakness    History of Present Illness    83 y/of with PMH significant for GERD, vertigo, colitis, post-surgical hypothyroidism, hypoparathyroidism, HLD, HTN, grade 1 diastolic dysfunction, and CAD s/p  NSTEMI 10/2011 with DES x 2 to RCA w/normal EF.  Underwent repeat LHC on 07/17/12 with patent RCA stent w/subtotal occulsion of acute marginal,which was jailed.  She has been medically managed since.  Since at least 05/2015, she has been having intermittent chest discomfort, typically occurring @ rest, w/o assoc Ss, lasting 5-10 mins, and resolving with sl NTG.  In 05/2015, she underwent stress testing, which revealed an EF 59% w/mod mid inferoseptal, mid inferior, apical lateral, and apex location  c/w scar and peri-infarct ischemia w/recommended medical management.  She has seen Dr. Harrington Challenger several times since then and each time c/o mild, chronic, nitrate responsive c/p, which ultimately was not felt to be ischemic related.  She was last seen in the office by Dr. Harrington Challenger on 01/23/16 for f/u and reported occasion chest heaviness not associated with activity. No changes made at that time.  She is on plavix, aspirin, CCB, and long-acting nitrate.  On  03/08/15, she was sitting on the couch and had onset of substernal "heaviness" in her chest with associated left bicep pain, maximal intensity 7/10.  No dizziness, sob, n/v, diaphoresis, or peripheral edema.  She took 1 nitro sl at home without change and when it did not ease up she went to the ED.  Pain lasted 4 hours and resolved spontaneously in ED.  Reports this pain is different from her prior angina episodes in that her normal angina is usually relieved with nitro sl and it is brief.  Denies any recent increase in angina episodes, at best occuring once or every other week without provocation.    ED found unchanged non-acute ECG, normal CXR, negative istat troponin x 2, Cr 1.24 (slighlty up from 1.03 01/23/16), Bun 22, WBC 11, H/H 13.2/40.7, and plts 246.  She was admitted to telemetry and has had no recurrent c/p.  Inpatient Medications    . amLODipine  10 mg Oral Daily  . aspirin EC  81 mg Oral Daily  . calcitRIOL  0.75 mcg Oral Daily  . clopidogrel  75 mg Oral Daily  . enoxaparin (LOVENOX) injection  40 mg Subcutaneous Q24H  . famotidine  20 mg Oral Daily  . gabapentin  300 mg Oral TID  . isosorbide mononitrate  90 mg Oral Daily  . levothyroxine  100 mcg Oral QAC breakfast  . pantoprazole  40 mg Oral Q1200  . rosuvastatin  20 mg Oral Daily    Family History    Family History  Problem Relation Age of Onset  . Colon cancer Brother   . Cancer Brother     COLON  . Hypertension Father   . Heart disease Father   . Diabetes Neg Hx   . Prostate cancer Neg Hx   . Breast cancer Neg Hx   . Blindness Sister   . Congestive Heart Failure Sister   . Hypertension Sister   . Thyroid disease Sister   . Cancer Brother     multiple myelomas    Social History    Social History   Social History  . Marital Status: Married    Spouse Name: N/A  . Number of Children: 4  . Years of Education: 14   Occupational History  . 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 Smokeless tobacco: Never Used  . Alcohol Use: No  . Drug Use: No  . Sexual Activity: Not on file   Other Topics Concern  . Not on file   Social History Narrative   Lives at home alone.  Her son lives near her.   Right-handed.   1 cup coffee per day.     Review of Systems    General:  No chills, fever, night sweats or weight changes.  Cardiovascular:  +++ chest pain, no dyspnea on exertion, edema, orthopnea, palpitations, paroxysmal nocturnal dyspnea. Dermatological: No rash, lesions/masses Respiratory: No cough, dyspnea Urologic: No hematuria, dysuria Abdominal:   No nausea, vomiting,  diarrhea, bright red blood per rectum, melena, or hematemesis Neurologic:  No visual changes, wkns, changes in mental status. All other systems reviewed and are otherwise negative except as noted above.  Physical Exam    Blood pressure 134/54, pulse 61, temperature 97.6 F (36.4 C), temperature source Oral, resp. rate 16, height 5\' 4"  (1.626 m), weight 197 lb 11.2 oz (89.676 kg), SpO2 97 %.  General: Pleasant, well nourished adult female NAD Psych: Normal affect. Neuro: Alert and oriented X 3. Moves all extremities spontaneously. HEENT: Normal  Neck: Supple without bruits or JVD. Lungs:  Resp regular and unlabored, CTA. Heart: RRR no s3, s4, or murmurs. Abdomen: Soft, non-tender, non-distended, BS + x 4.  Extremities: No clubbing, cyanosis or edema. DP/PT/Radials 2+ and equal bilaterally.  Labs    Troponin [facility] of Care Test)  Recent Labs  03/07/16 2321  TROPIPOC 0.01    Recent Labs  03/08/16 0316 03/08/16 0715  TROPONINI <0.03 <0.03   Lab Results  Component Value Date   WBC 11.0* 03/07/2016   HGB 13.2 03/07/2016   HCT 40.7 03/07/2016   MCV 87.3 03/07/2016   PLT 246 03/07/2016     Recent Labs Lab 03/07/16 1841  NA 143  K 4.1  CL 103  CO2 27  BUN 22*  CREATININE 1.24*  CALCIUM 8.4*  GLUCOSE 108*   Lab Results  Component Value Date    CHOL 171 01/23/2016   HDL 48 01/23/2016   LDLCALC 101 01/23/2016   TRIG 108 01/23/2016    Radiology Studies    Dg Chest 2 View  03/07/2016  CLINICAL DATA:  Acute onset of generalized chest pain and shortness of breath. Initial encounter. EXAM: CHEST  2 VIEW COMPARISON:  Chest radiograph performed 09/26/2015 FINDINGS: The lungs are well-aerated. Minimal bibasilar atelectasis is noted. There is no evidence of pleural effusion or pneumothorax. The heart is borderline normal in size. No acute osseous abnormalities are seen. IMPRESSION: Minimal bibasilar atelectasis noted.  Lungs otherwise clear. Electronically Signed   By: Garald Balding M.D.   On: 03/07/2016 19:08    ECG & Cardiac Imaging    RSR, 60, no acute ST/T changes.  Assessment & Plan    1.  Unstable Angina/CAD:  Pt has a h/o NSTEMI with RCA stenting in 2012, resulting in a jailed acute marginal branch.  She also had moderate LAD, LCX, and OM2 dzs.  She has been having intermittent c/p since at least 05/2015 with low risk MV @ that time (mod mid infsept/inf/ap lat/ap infarct with peri-infarct isch).  She has been medically managed since and has continued to have intermittent, nitrate responsive c/p.  She was admitted 3/15 w/ a prolonged episode of worsening c/p and pressure.  This resolved after ~ 4 hrs. She has been pain free since.  Trop neg. ECG non-acute.  Given prolonged h/o intermittent c/p and worsening of Ss yesterday, we will plan on diagnostic catheterization this afternoon.  The patient understands that risks include but are not limited to stroke (1 in 1000), death (1 in 79), kidney failure [usually temporary] (1 in 500), bleeding (1 in 200), allergic reaction [possibly serious] (1 in 200), and agrees to proceed.  Cont asa, statin, nitrate, ccb.  No  blocker 2/2 baseline relative bradycardia.  2.  Mild Renal Insuff: hydrate pre-cath.  3.  Hypertensive Heart Dzs:  Stable on ccb/nitrate.  4.  HL:  On crestor 20  LDL 101 in  January.  Increase crestor to 40.  Nl lft's  in 10/2015.  5.  Hypothyroidism w/ iatrogenic hyperthyroidism:  TSH 8 in 10/2015, 0.60 in 12/2015.  Followed by PCP as outpt.  Murray Hodgkins, NP 03/08/2016, 11:31 AM

## 2016-03-08 NOTE — Progress Notes (Addendum)
  Echocardiogram 2D Echocardiogram has been performed.  Darlina Sicilian M 03/08/2016, 10:43 AM

## 2016-03-08 NOTE — Interval H&P Note (Signed)
Cath Lab Visit (complete for each Cath Lab visit)  Clinical Evaluation Leading to the Procedure:   ACS: Yes.    Non-ACS:    Anginal Classification: CCS IV  Anti-ischemic medical therapy: Minimal Therapy (1 class of medications)  Non-Invasive Test Results: No non-invasive testing performed  Prior CABG: No previous CABG      History and Physical Interval Note:  03/08/2016 2:43 PM  Keyra T Whiting  has presented today for surgery, with the diagnosis of cp  The various methods of treatment have been discussed with the patient and family. After consideration of risks, benefits and other options for treatment, the patient has consented to  Procedure(s): Left Heart Cath and Coronary Angiography (N/A) as a surgical intervention .  The patient's history has been reviewed, patient examined, no change in status, stable for surgery.  I have reviewed the patient's chart and labs.  Questions were answered to the patient's satisfaction.     Aniyla Harling S.

## 2016-03-08 NOTE — Discharge Summary (Signed)
PATIENT DETAILS Name: [name] Age: 70 y.o. Sex: female Date of Birth: 1946/03/05 MRN: [id number]. Admitting Physician: Toy Baker, MD [id number], Erline Levine, MD  Admit Date: 03/07/2016 Discharge date: 03/08/2016  Recommendations for Outpatient Follow-up:  1. Optimize anti-anginal treatment 2. Ensure follow up with cardiolgy 3. Please repeat CBC/BMET at next visit  PRIMARY DISCHARGE DIAGNOSIS:  Active Problems:   CAD, NATIVE VESSEL   Chest pain      PAST MEDICAL HISTORY: Past Medical History  Diagnosis Date  . GERD (gastroesophageal reflux disease)     occasional  . Hyperlipidemia   . Hypertensive heart disease     a. Echocardiogram 11/19/11: Difficult acoustic windows, EF 60-65%, normal LV wall thickness, grade 1 diastolic dysfunction  . Vertigo   . Tubular adenoma of colon 06/2011  . Colitis   . Multinodular thyroid     Goiter s/p thyroidectomy in 2007 with post-op  hypocalcemia and post-op hypothyroidism/hypoparathyroidism with hypocalcemia  . CAD (coronary artery disease)     a. NSTEMI 10/2011: [place] 11/19/11: pLAD 30%, oOM 60%, AVCFX 30%, CFX after OM2 30%, pRCA 60 and 70%, then 99%, AM 80-90% with TIMI 3 flow.  PCI: Promus DES x 2 to RCA; b. 06/2012 Cath: patent RCA stents w/ subtl occl of Acute Marginal (jailed)->Med rx; c. 05/2015 MV: EF 59%, mod mid infsept/inf/ap lat/ap infarct with peri-infarct isch-->Med Rx.  Marland Kitchen Post-surgical hypothyroidism   . Hypoparathyroidism ([place])   . Palpitations     a. 03/2012 - patient set up for event monitor but did not wear correctly -  she declined wearing a repeat monitor  . Unspecified constipation 05/31/2013  . Back pain 05/31/2013  . Nocturia 05/31/2013  . Neck pain on right side 07/13/2013  . Sun-damaged skin 12/05/2014  . Zinc deficiency 11/26/2015    DISCHARGE MEDICATIONS: Current Discharge Medication List    CONTINUE these medications which have CHANGED   Details  rosuvastatin (CRESTOR) 40 MG tablet Take 1 tablet (40  mg total) by mouth daily. Qty: 30 tablet, Refills: 0      CONTINUE these medications which have NOT CHANGED   Details  amLODipine (NORVASC) 10 MG tablet Take 1 tablet (10 mg total) by mouth daily. Qty: 90 tablet, Refills: 3    aspirin EC 81 MG tablet Take 81 mg by mouth daily.      B Complex-C (B-COMPLEX WITH VITAMIN C) tablet Take 1 tablet by mouth daily.    calcitRIOL (ROCALTROL) 0.25 MCG capsule Take 3 capsules (0.75 mcg total) by mouth daily. Take 3 cap daily Qty: 270 capsule, Refills: 1   Associated Diagnoses: Postsurgical hypoparathyroidism (HCC)    calcium carbonate (TUMS - DOSED IN MG ELEMENTAL CALCIUM) 500 MG chewable tablet Chew 4 tablets by mouth 2 (two) times daily.     cholecalciferol (VITAMIN D) 1000 UNITS tablet Take 1,000 Units by mouth daily.    clopidogrel (PLAVIX) 75 MG tablet Take 1 tablet (75 mg total) by mouth daily. Qty: 90 tablet, Refills: 3    diazepam (VALIUM) 5 MG tablet Take 5 mg by mouth at bedtime as needed. (SLEEP) Refills: 0    famotidine (PEPCID) 20 MG tablet Take 1 tablet by mouth two  times daily Qty: 180 tablet, Refills: 1    gabapentin (NEURONTIN) 100 MG capsule Take 3 capsules (300 mg total) by mouth 3 (three) times daily. Qty: 270 capsule, Refills: 11    isosorbide mononitrate (IMDUR) 60 MG 24 hr tablet Take 1 1/2 tablets by mouth daily - total of  90mg . Qty: 135 tablet, Refills: 3    levothyroxine (SYNTHROID, LEVOTHROID) 100 MCG tablet Take 1 tablet (100 mcg total) by mouth daily. Qty: 30 tablet, Refills: 3    Menthol, Topical Analgesic, (ICY HOT EX) Apply 1 patch topically daily.    nitroGLYCERIN (NITROSTAT) 0.4 MG SL tablet Dissolve 1 tablet under the tongue every 5 minutes as   needed for chest pain as   directed Qty: 25 tablet, Refills: 2      STOP taking these medications     LORazepam (ATIVAN) 0.5 MG tablet         ALLERGIES:   Allergies  Allergen Reactions  . Zetia [Ezetimibe] Other (See Comments)    Myalgia,  paresthesias and weakness    BRIEF HPI:  See H&P, Labs, Consult and Test reports for all details in brief, patient was admitted for evaluation of chest pain  CONSULTATIONS:   cardiology  PERTINENT RADIOLOGIC STUDIES: Dg Chest 2 View  03/07/2016  CLINICAL DATA:  Acute onset of generalized chest pain and shortness of breath. Initial encounter. EXAM: CHEST  2 VIEW COMPARISON:  Chest radiograph performed 09/26/2015 FINDINGS: The lungs are well-aerated. Minimal bibasilar atelectasis is noted. There is no evidence of pleural effusion or pneumothorax. The heart is borderline normal in size. No acute osseous abnormalities are seen. IMPRESSION: Minimal bibasilar atelectasis noted.  Lungs otherwise clear. Electronically Signed   By: Garald Balding M.D.   On: 03/07/2016 19:08     PERTINENT LAB RESULTS: CBC:  Recent Labs  03/07/16 1841  WBC 11.0*  HGB 13.2  HCT 40.7  PLT 246   CMET CMP     Component Value Date/Time   NA 145 03/08/2016 1050   K 3.8 03/08/2016 1050   CL 104 03/08/2016 1050   CO2 28 03/08/2016 1050   GLUCOSE 103* 03/08/2016 1050   BUN 15 03/08/2016 1050   CREATININE 1.05* 03/08/2016 1050   CREATININE 1.03* 01/23/2016 1013   CALCIUM 8.3* 03/08/2016 1050   CALCIUM 7.8* 06/02/2007 2315   PROT 7.5 11/23/2015 1122   ALBUMIN 3.9 11/23/2015 1122   AST 17 01/23/2016 1013   ALT 19 11/23/2015 1122   ALKPHOS 76 11/23/2015 1122   BILITOT 0.5 11/23/2015 1122   GFRNONAA 53* 03/08/2016 1050   GFRAA >60 03/08/2016 1050    GFR Estimated Creatinine Clearance: 54.8 mL/min (by C-G formula based on Cr of 1.05). No results for input(s): LIPASE, AMYLASE in the last 72 hours.  Recent Labs  03/08/16 0316 03/08/16 0715  TROPONINI <0.03 <0.03   Invalid input(s): POCBNP No results for input(s): DDIMER in the last 72 hours. No results for input(s): HGBA1C in the last 72 hours. No results for input(s): CHOL, HDL, LDLCALC, TRIG, CHOLHDL, LDLDIRECT in the last 72 hours. No results  for input(s): TSH, T4TOTAL, T3FREE, THYROIDAB in the last 72 hours.  Invalid input(s): FREET3 No results for input(s): VITAMINB12, FOLATE, FERRITIN, TIBC, IRON, RETICCTPCT in the last 72 hours. Coags:  Recent Labs  03/07/16 1841  INR 0.86   Microbiology: No results found for this or any previous visit (from the past 240 hour(s)).   BRIEF HOSPITAL COURSE:  Unstable Angina: Typical features concerning for an unstable angina.Troponin/EKG negative.  Cardiology consulted.Underwent LHC that showed patient stents in RCA-recommendations were to continue medical management. Spoke with cardiology team, no further recommendations-ok to discharge. Note no further chest pain since admission  Active Problems  CAD, NATIVE VESSEL: NSTEMI 10/2011 w/[facility] 11/19/11 - 2 stents placed. Continue home medications: ASA, Plavix,  statin, Imdur. Per cards note-not on beta blocker due to baseline relative bradycardia.   Hypertension:controlled-continue Amlodipine, Imdur,   Post-operative hypothyroidism:continue Synthroid  Posit operative hypoparathyroidism: resume calcium supplements, continue calcitriol  Hyperlipidemia: Lipid panel 01/23/2016 WNL. Cardiology increased Crestor dose to 40mg . Continue.  GERD: Continue Pepcid   TODAY-DAY OF DISCHARGE:  Subjective:   [name] today has no headache,no chest abdominal pain,no new weakness tingling or numbness, feels much better wants to go home today.   Objective:   Blood pressure 123/63, pulse 70, temperature 98.5 F (36.9 C), temperature source Oral, resp. rate 0, height 5\' 4"  (1.626 m), weight 89.676 kg (197 lb 11.2 oz), SpO2 96 %. No intake or output data in the 24 hours ending 03/08/16 1550 Filed Weights   03/07/16 1839 03/08/16 0108  Weight: 90.901 kg (200 lb 6.4 oz) 89.676 kg (197 lb 11.2 oz)    Exam Awake Alert, Oriented *3, No new F.N deficits, Normal affect [place].AT,PERRAL Supple Neck,No JVD, No cervical lymphadenopathy appriciated.    Symmetrical Chest wall movement, Good air movement bilaterally, CTAB RRR,No Gallops,Rubs or new Murmurs, No Parasternal Heave +ve B.Sounds, Abd Soft, Non tender, No organomegaly appriciated, No rebound -guarding or rigidity. No Cyanosis, Clubbing or edema, No new Rash or bruise  DISCHARGE CONDITION: Stable  DISPOSITION: Home  DISCHARGE INSTRUCTIONS:    Activity:  As tolerated   Get Medicines reviewed and adjusted: Please take all your medications with you for your next visit with your Primary MD  Please request your Primary MD to go over all hospital tests and procedure/radiological results at the follow up, please ask your Primary MD to get all Hospital records sent to his/her office.  If you experience worsening of your admission symptoms, develop shortness of breath, life threatening emergency, suicidal or homicidal thoughts you must seek medical attention immediately by calling 911 or calling your MD immediately  if symptoms less severe.  You must read complete instructions/literature along with all the possible adverse reactions/side effects for all the Medicines you take and that have been prescribed to you. Take any new Medicines after you have completely understood and accpet all the possible adverse reactions/side effects.   Do not drive when taking Pain medications.   Do not take more than prescribed Pain, Sleep and Anxiety Medications  Special Instructions: If you have smoked or chewed Tobacco  in the last 2 yrs please stop smoking, stop any regular Alcohol  and or any Recreational drug use.  Wear Seat belts while driving.  Please note  You were cared for by a hospitalist during your hospital stay. Once you are discharged, your primary care physician will handle any further medical issues. Please note that NO REFILLS for any discharge medications will be authorized once you are discharged, as it is imperative that you return to your primary care physician (or establish  a relationship with a primary care physician if you do not have one) for your aftercare needs so that they can reassess your need for medications and monitor your lab values.  Diet recommendation: Heart Healthy diet   Discharge Instructions    Call MD for:  severe uncontrolled pain    Complete by:  As directed      Diet - low sodium heart healthy    Complete by:  As directed      Increase activity slowly    Complete by:  As directed            Follow-up Information    Follow  up with Penni Homans, MD. Schedule an appointment as soon as possible for a visit in 1 week.   Specialty:  Family Medicine   Why:  Hospital follow up   Contact information:   [place] [address] [phone]       Follow up with Angelena Form R, PA-C. Schedule an appointment as soon as possible for a visit on 03/22/2016.   Specialties:  Cardiology, Radiology   Why:  2 PM - Dr. Harrington Challenger' PA   Contact information:   [place] [address] [phone]       Total Time spent on discharge equals 25  minutes.  SignedOren Binet 03/08/2016 3:50 PM

## 2016-03-09 ENCOUNTER — Encounter (HOSPITAL_COMMUNITY): Payer: Self-pay | Admitting: Interventional Cardiology

## 2016-03-09 ENCOUNTER — Telehealth: Payer: Self-pay | Admitting: *Deleted

## 2016-03-09 NOTE — Telephone Encounter (Signed)
Transition Care Management Follow-up Telephone Call  Name: [name] Age: 70 y.o. Sex: female Date of Birth: 1946/11/14 MRN: [id number]. Admitting Physician: Toy Baker, MD [id number], Erline Levine, MD  Admit Date: 03/07/2016 Discharge date: 03/08/2016  Recommendations for Outpatient Follow-up:  1. Optimize anti-anginal treatment 2. Ensure follow up with cardiolgy 3. Please repeat CBC/BMET at next visit  PRIMARY DISCHARGE DIAGNOSIS: Active Problems:  CAD, NATIVE VESSEL  Chest pain   How have you been since you were released from the hospital? "I'm a little bit dizzy because I took some nitroglycerin."   Do you understand why you were in the hospital? yes   Do you understand the discharge instructions? yes   Where were you discharged to? Home   Items Reviewed:  Medications reviewed: yes  Allergies reviewed: yes  Dietary changes reviewed: yes, heart healthy Referrals reviewed: yes, cardiology  Functional Questionnaire:   Activities of Daily Living (ADLs):   She states they are independent in the following: ambulation, bathing and hygiene, feeding, continence, grooming and toileting States they require assistance with the following: dressing   Any transportation issues/concerns?: no   Any patient concerns? no   Confirmed importance and date/time of follow-up visits scheduled yes  Provider Appointment booked with Dr. Penni Homans 03/13/16 at 2:30pm  Confirmed with patient if condition begins to worsen call PCP or go to the ER.  Patient was given the office number and encouraged to call back with question or concerns.  : yes

## 2016-03-12 ENCOUNTER — Other Ambulatory Visit: Payer: Self-pay

## 2016-03-13 ENCOUNTER — Ambulatory Visit (INDEPENDENT_AMBULATORY_CARE_PROVIDER_SITE_OTHER): Payer: Medicare Other | Admitting: Family Medicine

## 2016-03-13 ENCOUNTER — Encounter: Payer: Self-pay | Admitting: Family Medicine

## 2016-03-13 VITALS — BP 120/72 | HR 71 | Temp 98.7°F | Ht 64.0 in | Wt 202.0 lb

## 2016-03-13 DIAGNOSIS — K219 Gastro-esophageal reflux disease without esophagitis: Secondary | ICD-10-CM

## 2016-03-13 DIAGNOSIS — E892 Postprocedural hypoparathyroidism: Secondary | ICD-10-CM

## 2016-03-13 DIAGNOSIS — R1013 Epigastric pain: Secondary | ICD-10-CM

## 2016-03-13 DIAGNOSIS — E039 Hypothyroidism, unspecified: Secondary | ICD-10-CM

## 2016-03-13 DIAGNOSIS — E785 Hyperlipidemia, unspecified: Secondary | ICD-10-CM

## 2016-03-13 DIAGNOSIS — M79621 Pain in right upper arm: Secondary | ICD-10-CM | POA: Insufficient documentation

## 2016-03-13 DIAGNOSIS — I10 Essential (primary) hypertension: Secondary | ICD-10-CM

## 2016-03-13 DIAGNOSIS — E876 Hypokalemia: Secondary | ICD-10-CM

## 2016-03-13 DIAGNOSIS — E60 Dietary zinc deficiency: Secondary | ICD-10-CM | POA: Diagnosis not present

## 2016-03-13 DIAGNOSIS — R079 Chest pain, unspecified: Secondary | ICD-10-CM

## 2016-03-13 DIAGNOSIS — Z0000 Encounter for general adult medical examination without abnormal findings: Secondary | ICD-10-CM

## 2016-03-13 DIAGNOSIS — Y840 Cardiac catheterization as the cause of abnormal reaction of the patient, or of later complication, without mention of misadventure at the time of the procedure: Secondary | ICD-10-CM

## 2016-03-13 DIAGNOSIS — M79601 Pain in right arm: Secondary | ICD-10-CM

## 2016-03-13 HISTORY — DX: Pain in right upper arm: M79.621

## 2016-03-13 MED ORDER — CLOPIDOGREL BISULFATE 75 MG PO TABS: 75.0000 mg | ORAL_TABLET | Freq: Every day | ORAL | Status: DC

## 2016-03-13 MED ORDER — NITROGLYCERIN 0.4 MG SL SUBL: SUBLINGUAL_TABLET | SUBLINGUAL | Status: DC

## 2016-03-13 MED ORDER — PANTOPRAZOLE SODIUM 20 MG PO TBEC: 20.0000 mg | DELAYED_RELEASE_TABLET | Freq: Every day | ORAL | Status: DC

## 2016-03-13 MED ORDER — AMLODIPINE BESYLATE 10 MG PO TABS: 10.0000 mg | ORAL_TABLET | Freq: Every day | ORAL | Status: DC

## 2016-03-13 MED ORDER — ROSUVASTATIN CALCIUM 40 MG PO TABS: 40.0000 mg | ORAL_TABLET | Freq: Every day | ORAL | Status: DC

## 2016-03-13 NOTE — Progress Notes (Signed)
Subjective:    Patient ID: [name], female    DOB: [birth year], 70 y.o.   MRN: [id number]  Chief Complaint  Patient presents with  . Hospitalization Follow-up    HPI Patient is in today for hospitalization follow up. Patient is still having pain on right arm and arm pit from cardiac catherization only with movement, patient describes it as shooting pain.  Patient was feeling some chest heaviness and started burping so she decided to go to hospital.  Patient also reports she is having some choking during the night.  Denies palp/SOB/HA/congestion/fevers or GU c/o. Taking meds as prescribed     Past Medical History  Diagnosis Date  . GERD (gastroesophageal reflux disease)     occasional  . Hyperlipidemia   . Hypertensive heart disease     a. Echocardiogram 11/19/11: Difficult acoustic windows, EF 60-65%, normal LV wall thickness, grade 1 diastolic dysfunction  . Vertigo   . Tubular adenoma of colon 06/2011  . Colitis   . Multinodular thyroid     Goiter s/p thyroidectomy in 2007 with post-op  hypocalcemia and post-op hypothyroidism/hypoparathyroidism with hypocalcemia  . CAD (coronary artery disease)     a. NSTEMI 10/2011: [facility] 11/19/11: pLAD 30%, oOM 60%, AVCFX 30%, CFX after OM2 30%, pRCA 60 and 70%, then 99%, AM 80-90% with TIMI 3 flow.  PCI: Promus DES x 2 to RCA; b. 06/2012 Cath: patent RCA stents w/ subtl occl of Acute Marginal (jailed)->Med rx; c. 05/2015 MV: EF 59%, mod mid infsept/inf/ap lat/ap infarct with peri-infarct isch-->Med Rx; d. 02/2016 Cath: LM nl, LAD 84m, RI 50, RCA patent stents.  . Post-surgical hypothyroidism   . Hypoparathyroidism ([place])   . Palpitations     a. 03/2012 - patient set up for event monitor but did not wear correctly -  she declined wearing a repeat monitor  . Unspecified constipation 05/31/2013  . Back pain 05/31/2013  . Nocturia 05/31/2013  . Neck pain on right side 07/13/2013  . Sun-damaged skin 12/05/2014  . Zinc deficiency 11/26/2015  . Pain in  right axilla 03/13/2016    Past Surgical History  Procedure Laterality Date  . Colonoscopy  Aenomatous polyps    07/05/2011  . Total thyroidectomy  2007    GOITER  . Shoulder arthroscopy w/ rotator cuff repair      right  . Coronary angioplasty with stent placement    . Colonoscopy N/A 09/28/2014    Procedure: COLONOSCOPY;  Surgeon: Ladene Artist, MD;  Location: [facility] ENDOSCOPY;  Service: Endoscopy;  Laterality: N/A;  . Percutaneous coronary stent intervention (pci-s) N/A 11/19/2011    Procedure: PERCUTANEOUS CORONARY STENT INTERVENTION (PCI-S);  Surgeon: Hillary Bow, MD;  Location: [facility] CATH LAB;  Service: Cardiovascular;  Laterality: N/A;  . Left heart catheterization with coronary angiogram N/A 07/17/2012    Procedure: LEFT HEART CATHETERIZATION WITH CORONARY ANGIOGRAM;  Surgeon: Hillary Bow, MD;  Location: [facility] CATH LAB;  Service: Cardiovascular;  Laterality: N/A;  . Cardiac catheterization N/A 03/08/2016    Procedure: Left Heart Cath and Coronary Angiography;  Surgeon: Jettie Booze, MD;  Location: [facility] LAB;  Service: Cardiovascular;  Laterality: N/A;    Family History  Problem Relation Age of Onset  . Colon cancer Brother   . Cancer Brother     COLON  . Hypertension Father   . Heart disease Father   . Diabetes Neg Hx   . Prostate cancer Neg Hx   . Breast cancer Neg Hx   .  Blindness Sister   . Congestive Heart Failure Sister   . Hypertension Sister   . Thyroid disease Sister   . Cancer Brother     multiple myelomas    Social History   Social History  . Marital Status: Married    Spouse Name: N/A  . Number of Children: 4  . Years of Education: 14   Occupational History  . Sales person at [employer]  . Smoking status: Never Smoker   . Smokeless tobacco: Never Used  . Alcohol Use: No  . Drug Use: No  . Sexual Activity: Not on file   Other Topics Concern  . Not on file   Social History Narrative   Lives at home  alone.  Her son lives near her.   Right-handed.   1 cup coffee per day.    Outpatient Prescriptions Prior to Visit  Medication Sig Dispense Refill  . aspirin EC 81 MG tablet Take 81 mg by mouth daily.      . B Complex-C (B-COMPLEX WITH VITAMIN C) tablet Take 1 tablet by mouth daily.    . calcitRIOL (ROCALTROL) 0.25 MCG capsule Take 3 capsules (0.75 mcg total) by mouth daily. Take 3 cap daily 270 capsule 1  . calcium carbonate (TUMS - DOSED IN MG ELEMENTAL CALCIUM) 500 MG chewable tablet Chew 4 tablets by mouth 2 (two) times daily.     . cholecalciferol (VITAMIN D) 1000 UNITS tablet Take 1,000 Units by mouth daily.    . diazepam (VALIUM) 5 MG tablet Take 5 mg by mouth at bedtime as needed. (SLEEP)  0  . famotidine (PEPCID) 20 MG tablet Take 1 tablet by mouth two  times daily 180 tablet 1  . gabapentin (NEURONTIN) 100 MG capsule Take 3 capsules (300 mg total) by mouth 3 (three) times daily. 270 capsule 11  . isosorbide mononitrate (IMDUR) 60 MG 24 hr tablet Take 1 1/2 tablets by mouth daily - total of 90mg . 135 tablet 3  . levothyroxine (SYNTHROID, LEVOTHROID) 100 MCG tablet Take 1 tablet (100 mcg total) by mouth daily. 30 tablet 3  . Menthol, Topical Analgesic, (ICY HOT EX) Apply 1 patch topically daily.    Marland Kitchen amLODipine (NORVASC) 10 MG tablet Take 1 tablet (10 mg total) by mouth daily. 90 tablet 3  . clopidogrel (PLAVIX) 75 MG tablet Take 1 tablet (75 mg total) by mouth daily. 90 tablet 3  . nitroGLYCERIN (NITROSTAT) 0.4 MG SL tablet Dissolve 1 tablet under the tongue every 5 minutes as   needed for chest pain as   directed (Patient taking differently: Place 0.4 mg under the tongue every 5 (five) minutes as needed for chest pain. ) 25 tablet 2  . rosuvastatin (CRESTOR) 40 MG tablet Take 1 tablet (40 mg total) by mouth daily. 30 tablet 0   No facility-administered medications prior to visit.    Allergies  Allergen Reactions  . Zetia [Ezetimibe] Other (See Comments)    Myalgia, paresthesias  and weakness    Review of Systems  Constitutional: Negative for fever and malaise/fatigue.  HENT: Negative for congestion.   Eyes: Negative for blurred vision.  Respiratory: Negative for shortness of breath.   Cardiovascular: Negative for chest pain, palpitations and leg swelling.  Gastrointestinal: Negative for nausea, abdominal pain and blood in stool.  Genitourinary: Negative for dysuria and frequency.  Musculoskeletal: Negative for falls.  Skin: Negative for rash.  Neurological: Negative for dizziness, loss of consciousness and headaches.  Endo/Heme/Allergies:  Negative for environmental allergies.  Psychiatric/Behavioral: Negative for depression. The patient is not nervous/anxious.        Objective:    Physical Exam  Constitutional: She is oriented to person, place, and time. She appears well-developed and well-nourished. No distress.  HENT:  Head: Normocephalic and atraumatic.  Eyes: Conjunctivae are normal.  Neck: Neck supple. No thyromegaly present.  Cardiovascular: Normal rate, regular rhythm and normal heart sounds.   No murmur heard. Pulmonary/Chest: Effort normal and breath sounds normal. No respiratory distress.  Abdominal: Soft. Bowel sounds are normal. She exhibits no distension and no mass. There is no tenderness.  Musculoskeletal: She exhibits no edema.  Lymphadenopathy:    She has no cervical adenopathy.  Neurological: She is alert and oriented to person, place, and time.  Skin: Skin is warm and dry.  Psychiatric: She has a normal mood and affect. Her behavior is normal.    BP 120/72 mmHg  Pulse 71  Temp(Src) 98.7 F (37.1 C) (Other (Comment))  Ht 5\' 4"  (1.626 m)  Wt 202 lb (91.627 kg)  BMI 34.66 kg/m2  SpO2 95% Wt Readings from Last 3 Encounters:  03/13/16 202 lb (91.627 kg)  03/08/16 197 lb 11.2 oz (89.676 kg)  02/21/16 192 lb (87.091 kg)     Lab Results  Component Value Date   WBC 11.4* 03/13/2016   HGB 13.5 03/13/2016   HCT 40.5  03/13/2016   PLT 275.0 03/13/2016   GLUCOSE 95 03/13/2016   CHOL 171 01/23/2016   TRIG 108 01/23/2016   HDL 48 01/23/2016   LDLDIRECT 83.8 08/11/2012   LDLCALC 101 01/23/2016   ALT 19 03/13/2016   AST 20 03/13/2016   NA 141 03/13/2016   K 4.9 03/13/2016   CL 101 03/13/2016   CREATININE 1.06 03/13/2016   BUN 26* 03/13/2016   CO2 31 03/13/2016   TSH 3.79 03/13/2016   INR 0.86 03/07/2016    Lab Results  Component Value Date   TSH 3.79 03/13/2016   Lab Results  Component Value Date   WBC 11.4* 03/13/2016   HGB 13.5 03/13/2016   HCT 40.5 03/13/2016   MCV 85.0 03/13/2016   PLT 275.0 03/13/2016   Lab Results  Component Value Date   NA 141 03/13/2016   K 4.9 03/13/2016   CO2 31 03/13/2016   GLUCOSE 95 03/13/2016   BUN 26* 03/13/2016   CREATININE 1.06 03/13/2016   BILITOT 0.3 03/13/2016   ALKPHOS 87 03/13/2016   AST 20 03/13/2016   ALT 19 03/13/2016   PROT 7.6 03/13/2016   ALBUMIN 4.0 03/13/2016   CALCIUM 8.9 03/13/2016   ANIONGAP 13 03/08/2016   GFR 54.62* 03/13/2016   Lab Results  Component Value Date   CHOL 171 01/23/2016   Lab Results  Component Value Date   HDL 48 01/23/2016   Lab Results  Component Value Date   LDLCALC 101 01/23/2016   Lab Results  Component Value Date   TRIG 108 01/23/2016   Lab Results  Component Value Date   CHOLHDL 3.6 01/23/2016   No results found for: HGBA1C     Assessment & Plan:   Problem List Items Addressed This Visit    Chest pain    Patient is here today for hospital follow up, she was admitted with chest pain and complete cardiac work up was unremarkable. She is improved since returning home. Likely pain related to GI cause      Essential hypertension, benign    Well controlled, no changes  to meds. Encouraged heart healthy diet such as the DASH diet and exercise as tolerated.       Relevant Medications   rosuvastatin (CRESTOR) 40 MG tablet   amLODipine (NORVASC) 10 MG tablet   clopidogrel (PLAVIX) 75 MG  tablet   nitroGLYCERIN (NITROSTAT) 0.4 MG SL tablet   Other Relevant Orders   Ambulatory referral to Gastroenterology   CBC (Completed)   Comprehensive metabolic panel (Completed)   TSH (Completed)   GERD (gastroesophageal reflux disease)    Avoid offending foods, start probiotics. Do not eat large meals in late evening and consider raising head of bed. Likely responsible for her chest pain. Referred to GI, increased PPI, H Pylori is negative today      Relevant Medications   pantoprazole (PROTONIX) 20 MG tablet   Hyperlipidemia    Encouraged heart healthy diet, increase exercise, avoid trans fats, consider a krill oil cap daily      Relevant Medications   rosuvastatin (CRESTOR) 40 MG tablet   amLODipine (NORVASC) 10 MG tablet   nitroGLYCERIN (NITROSTAT) 0.4 MG SL tablet   Hypokalemia   Relevant Medications   rosuvastatin (CRESTOR) 40 MG tablet   amLODipine (NORVASC) 10 MG tablet   clopidogrel (PLAVIX) 75 MG tablet   Other Relevant Orders   Ambulatory referral to Gastroenterology   CBC (Completed)   Comprehensive metabolic panel (Completed)   TSH (Completed)   Hypothyroidism    On Levothyroxine, continue to monitor      Relevant Medications   rosuvastatin (CRESTOR) 40 MG tablet   amLODipine (NORVASC) 10 MG tablet   clopidogrel (PLAVIX) 75 MG tablet   Other Relevant Orders   Ambulatory referral to Gastroenterology   CBC (Completed)   Comprehensive metabolic panel (Completed)   TSH (Completed)   Pain in right axilla    Pain in axillae s/p cardiac cath on 3/16. Ultrasound to rule out blood clot. Other wise apply heat and no strenuous activity      Postsurgical hypoparathyroidism ([facility])   Relevant Medications   rosuvastatin (CRESTOR) 40 MG tablet   amLODipine (NORVASC) 10 MG tablet   clopidogrel (PLAVIX) 75 MG tablet   Other Relevant Orders   Ambulatory referral to Gastroenterology   CBC (Completed)   Comprehensive metabolic panel (Completed)   TSH (Completed)    Preventative health care   Relevant Medications   rosuvastatin (CRESTOR) 40 MG tablet   amLODipine (NORVASC) 10 MG tablet   clopidogrel (PLAVIX) 75 MG tablet   Other Relevant Orders   Ambulatory referral to Gastroenterology   CBC (Completed)   Comprehensive metabolic panel (Completed)   TSH (Completed)   Zinc deficiency   Relevant Medications   rosuvastatin (CRESTOR) 40 MG tablet   amLODipine (NORVASC) 10 MG tablet   clopidogrel (PLAVIX) 75 MG tablet   Other Relevant Orders   Ambulatory referral to Gastroenterology   CBC (Completed)   Comprehensive metabolic panel (Completed)   TSH (Completed)    Other Visit Diagnoses    Abdominal pain, epigastric    -  Primary    Relevant Medications    rosuvastatin (CRESTOR) 40 MG tablet    amLODipine (NORVASC) 10 MG tablet    clopidogrel (PLAVIX) 75 MG tablet    pantoprazole (PROTONIX) 20 MG tablet    Other Relevant Orders    Ambulatory referral to Gastroenterology    CBC (Completed)    Comprehensive metabolic panel (Completed)    TSH (Completed)    H. pylori antibody, IgG (Completed)    Right arm  pain        Relevant Orders    [place] Upper Ext Art Right    Cardiac catheterisation as the cause of abnormal reaction of patient, or of later complication        Relevant Orders    [place] Upper Ext Art Right       I am having Ms. Feldkamp start on pantoprazole. I am also having her maintain her calcium carbonate, aspirin EC, B-complex with vitamin C, cholecalciferol, (Menthol, Topical Analgesic, (ICY HOT EX)), diazepam, calcitRIOL, famotidine, isosorbide mononitrate, levothyroxine, gabapentin, rosuvastatin, amLODipine, clopidogrel, and nitroGLYCERIN.  Meds ordered this encounter  Medications  . rosuvastatin (CRESTOR) 40 MG tablet    Sig: Take 1 tablet (40 mg total) by mouth daily.    Dispense:  30 tablet    Refill:  6  . amLODipine (NORVASC) 10 MG tablet    Sig: Take 1 tablet (10 mg total) by mouth daily.    Dispense:  90 tablet    Refill:   3  . clopidogrel (PLAVIX) 75 MG tablet    Sig: Take 1 tablet (75 mg total) by mouth daily.    Dispense:  90 tablet    Refill:  3  . nitroGLYCERIN (NITROSTAT) 0.4 MG SL tablet    Sig: Dissolve 1 tablet under the tongue every 5 minutes as   needed for chest pain as   directed    Dispense:  25 tablet    Refill:  2  . pantoprazole (PROTONIX) 20 MG tablet    Sig: Take 1 tablet (20 mg total) by mouth daily.    Dispense:  30 tablet    Refill:  2     Penni Homans, MD

## 2016-03-13 NOTE — Progress Notes (Signed)
Pre visit review using our clinic review tool, if applicable. No additional management support is needed unless otherwise documented below in the visit note. 

## 2016-03-13 NOTE — Patient Instructions (Addendum)
NOW Probiotic Vitamin. Available at [employer].  Gastroesophageal Reflux Disease, Adult Normally, food travels down the esophagus and stays in the stomach to be digested. However, when a person has gastroesophageal reflux disease (GERD), food and stomach acid move back up into the esophagus. When this happens, the esophagus becomes sore and inflamed. Over time, GERD can create small holes (ulcers) in the lining of the esophagus.  CAUSES This condition is caused by a problem with the muscle between the esophagus and the stomach (lower esophageal sphincter, or LES). Normally, the LES muscle closes after food passes through the esophagus to the stomach. When the LES is weakened or abnormal, it does not close properly, and that allows food and stomach acid to go back up into the esophagus. The LES can be weakened by certain dietary substances, medicines, and medical conditions, including:  Tobacco use.  Pregnancy.  Having a hiatal hernia.  Heavy alcohol use.  Certain foods and beverages, such as coffee, chocolate, onions, and peppermint. RISK FACTORS This condition is more likely to develop in:  People who have an increased body weight.  People who have connective tissue disorders.  People who use NSAID medicines. SYMPTOMS Symptoms of this condition include:  Heartburn.  Difficult or painful swallowing.  The feeling of having a lump in the throat.  Abitter taste in the mouth.  Bad breath.  Having a large amount of saliva.  Having an upset or bloated stomach.  Belching.  Chest pain.  Shortness of breath or wheezing.  Ongoing (chronic) cough or a night-time cough.  Wearing away of tooth enamel.  Weight loss. Different conditions can cause chest pain. Make sure to see your health care provider if you experience chest pain. DIAGNOSIS Your health care provider will take a medical history and perform a physical exam. To determine if you have mild or severe GERD,  your health care provider may also monitor how you respond to treatment. You may also have other tests, including:  An endoscopy toexamine your stomach and esophagus with a small camera.  A test thatmeasures the acidity level in your esophagus.  A test thatmeasures how much pressure is on your esophagus.  A barium swallow or modified barium swallow to show the shape, size, and functioning of your esophagus. TREATMENT The goal of treatment is to help relieve your symptoms and to prevent complications. Treatment for this condition may vary depending on how severe your symptoms are. Your health care provider may recommend:  Changes to your diet.  Medicine.  Surgery. HOME CARE INSTRUCTIONS Diet  Follow a diet as recommended by your health care provider. This may involve avoiding foods and drinks such as:  Coffee and tea (with or without caffeine).  Drinks that containalcohol.  Energy drinks and sports drinks.  Carbonated drinks or sodas.  Chocolate and cocoa.  Peppermint and mint flavorings.  Garlic and onions.  Horseradish.  Spicy and acidic foods, including peppers, chili powder, curry powder, vinegar, hot sauces, and barbecue sauce.  Citrus fruit juices and citrus fruits, such as oranges, lemons, and limes.  Tomato-based foods, such as red sauce, chili, salsa, and pizza with red sauce.  Fried and fatty foods, such as donuts, french fries, potato chips, and high-fat dressings.  High-fat meats, such as hot dogs and fatty cuts of red and white meats, such as rib eye steak, sausage, ham, and bacon.  High-fat dairy items, such as whole milk, butter, and cream cheese.  Eat small, frequent meals instead of large meals.  Avoid drinking large amounts of liquid with your meals.  Avoid eating meals during the 2-3 hours before bedtime.  Avoid lying down right after you eat.  Do not exercise right after you eat. General Instructions  Pay attention to any changes  in your symptoms.  Take over-the-counter and prescription medicines only as told by your health care provider. Do not take aspirin, ibuprofen, or other NSAIDs unless your health care provider told you to do so.  Do not use any tobacco products, including cigarettes, chewing tobacco, and e-cigarettes. If you need help quitting, ask your health care provider.  Wear loose-fitting clothing. Do not wear anything tight around your waist that causes pressure on your abdomen.  Raise (elevate) the head of your bed 6 inches (15cm).  Try to reduce your stress, such as with yoga or meditation. If you need help reducing stress, ask your health care provider.  If you are overweight, reduce your weight to an amount that is healthy for you. Ask your health care provider for guidance about a safe weight loss goal.  Keep all follow-up visits as told by your health care provider. This is important. SEEK MEDICAL CARE IF:  You have new symptoms.  You have unexplained weight loss.  You have difficulty swallowing, or it hurts to swallow.  You have wheezing or a persistent cough.  Your symptoms do not improve with treatment.  You have a hoarse voice. SEEK IMMEDIATE MEDICAL CARE IF:  You have pain in your arms, neck, jaw, teeth, or back.  You feel sweaty, dizzy, or light-headed.  You have chest pain or shortness of breath.  You vomit and your vomit looks like blood or coffee grounds.  You faint.  Your stool is bloody or black.  You cannot swallow, drink, or eat.   This information is not intended to replace advice given to you by your health care provider. Make sure you discuss any questions you have with your health care provider.   Document Released: 09/19/2005 Document Revised: 08/31/2015 Document Reviewed: 04/06/2015 Elsevier Interactive Patient Education [employer].

## 2016-03-13 NOTE — Assessment & Plan Note (Signed)
Pain in axillae s/p cardiac cath on 3/16. Ultrasound to rule out blood clot. Other wise apply heat and no strenuous activity

## 2016-03-14 ENCOUNTER — Telehealth: Payer: Self-pay

## 2016-03-14 DIAGNOSIS — M79601 Pain in right arm: Secondary | ICD-10-CM | POA: Diagnosis not present

## 2016-03-14 LAB — COMPREHENSIVE METABOLIC PANEL
ALK PHOS: 87 U/L (ref 39–117)
ALT: 19 U/L (ref 0–35)
AST: 20 U/L (ref 0–37)
Albumin: 4 g/dL (ref 3.5–5.2)
BILIRUBIN TOTAL: 0.3 mg/dL (ref 0.2–1.2)
BUN: 26 mg/dL — ABNORMAL HIGH (ref 6–23)
CALCIUM: 8.9 mg/dL (ref 8.4–10.5)
CO2: 31 meq/L (ref 19–32)
CREATININE: 1.06 mg/dL (ref 0.40–1.20)
Chloride: 101 mEq/L (ref 96–112)
GFR: 54.62 mL/min — AB (ref >60.00)
Glucose, Bld: 95 mg/dL (ref 70–99)
Potassium: 4.9 mEq/L (ref 3.5–5.1)
Sodium: 141 mEq/L (ref 135–145)
TOTAL PROTEIN: 7.6 g/dL (ref 6.0–8.3)

## 2016-03-14 LAB — CBC
HCT: 40.5 % (ref 36.0–46.0)
HEMOGLOBIN: 13.5 g/dL (ref 12.0–15.0)
MCHC: 33.4 g/dL (ref 30.0–36.0)
MCV: 85 fl (ref 78.0–100.0)
PLATELETS: 275 10*3/uL (ref 150.0–400.0)
RBC: 4.76 Mil/uL (ref 3.87–5.11)
RDW: 15.2 % (ref 11.5–15.5)
WBC: 11.4 10*3/uL — AB (ref 4.0–10.5)

## 2016-03-14 LAB — TSH: TSH: 3.79 u[IU]/mL (ref 0.35–4.50)

## 2016-03-14 LAB — H. PYLORI ANTIBODY, IGG: H PYLORI IGG: NEGATIVE

## 2016-03-14 NOTE — Assessment & Plan Note (Signed)
Avoid offending foods, start probiotics. Do not eat large meals in late evening and consider raising head of bed. Likely responsible for her chest pain. Referred to GI, increased PPI, H Pylori is negative today

## 2016-03-14 NOTE — Assessment & Plan Note (Signed)
On Levothyroxine, continue to monitor 

## 2016-03-14 NOTE — Telephone Encounter (Signed)
Yes she needs the appt with GI I placed the referral while she was here she should here soon

## 2016-03-14 NOTE — Assessment & Plan Note (Signed)
Well controlled, no changes to meds. Encouraged heart healthy diet such as the DASH diet and exercise as tolerated.  °

## 2016-03-14 NOTE — Assessment & Plan Note (Signed)
Patient is here today for hospital follow up, she was admitted with chest pain and complete cardiac work up was unremarkable. She is improved since returning home. Likely pain related to GI cause

## 2016-03-14 NOTE — Assessment & Plan Note (Signed)
Encouraged heart healthy diet, increase exercise, avoid trans fats, consider a krill oil cap daily 

## 2016-03-14 NOTE — Telephone Encounter (Signed)
Spoke with pt and she states that since the H pylori was negative she wants to know if she will need to go ahead with the appointment for the endoscopy. Please advise.

## 2016-03-15 ENCOUNTER — Emergency Department: Admission: RE | Admit: 2016-03-15 | Payer: Medicare Other | Source: Ambulatory Visit

## 2016-03-15 ENCOUNTER — Emergency Department (HOSPITAL_COMMUNITY): Payer: Medicare Other

## 2016-03-15 ENCOUNTER — Emergency Department (HOSPITAL_BASED_OUTPATIENT_CLINIC_OR_DEPARTMENT_OTHER)
Admit: 2016-03-15 | Discharge: 2016-03-15 | Disposition: A | Discharge status: Home / Self Care | Payer: Medicare Other | Attending: Emergency Medicine | Admitting: Emergency Medicine

## 2016-03-15 ENCOUNTER — Emergency Department (HOSPITAL_COMMUNITY)
Admission: EM | Admit: 2016-03-15 | Discharge: 2016-03-15 | Disposition: A | Discharge status: Home / Self Care | Payer: Medicare Other | Attending: Emergency Medicine | Admitting: Emergency Medicine

## 2016-03-15 ENCOUNTER — Encounter (HOSPITAL_COMMUNITY): Payer: Self-pay

## 2016-03-15 ENCOUNTER — Telehealth: Payer: Self-pay

## 2016-03-15 DIAGNOSIS — M19011 Primary osteoarthritis, right shoulder: Secondary | ICD-10-CM | POA: Insufficient documentation

## 2016-03-15 DIAGNOSIS — Z872 Personal history of diseases of the skin and subcutaneous tissue: Secondary | ICD-10-CM | POA: Diagnosis not present

## 2016-03-15 DIAGNOSIS — M79601 Pain in right arm: Secondary | ICD-10-CM

## 2016-03-15 DIAGNOSIS — K219 Gastro-esophageal reflux disease without esophagitis: Secondary | ICD-10-CM | POA: Diagnosis not present

## 2016-03-15 DIAGNOSIS — I2510 Atherosclerotic heart disease of native coronary artery without angina pectoris: Secondary | ICD-10-CM | POA: Diagnosis not present

## 2016-03-15 DIAGNOSIS — E785 Hyperlipidemia, unspecified: Secondary | ICD-10-CM | POA: Diagnosis not present

## 2016-03-15 DIAGNOSIS — Z9861 Coronary angioplasty status: Secondary | ICD-10-CM | POA: Diagnosis not present

## 2016-03-15 DIAGNOSIS — E209 Hypoparathyroidism, unspecified: Secondary | ICD-10-CM | POA: Diagnosis not present

## 2016-03-15 DIAGNOSIS — E890 Postprocedural hypothyroidism: Secondary | ICD-10-CM | POA: Insufficient documentation

## 2016-03-15 DIAGNOSIS — Z79899 Other long term (current) drug therapy: Secondary | ICD-10-CM | POA: Insufficient documentation

## 2016-03-15 DIAGNOSIS — I119 Hypertensive heart disease without heart failure: Secondary | ICD-10-CM | POA: Diagnosis not present

## 2016-03-15 DIAGNOSIS — M7989 Other specified soft tissue disorders: Secondary | ICD-10-CM | POA: Diagnosis not present

## 2016-03-15 DIAGNOSIS — Z7982 Long term (current) use of aspirin: Secondary | ICD-10-CM | POA: Insufficient documentation

## 2016-03-15 DIAGNOSIS — R079 Chest pain, unspecified: Secondary | ICD-10-CM | POA: Insufficient documentation

## 2016-03-15 DIAGNOSIS — M79609 Pain in unspecified limb: Secondary | ICD-10-CM

## 2016-03-15 DIAGNOSIS — Z9889 Other specified postprocedural states: Secondary | ICD-10-CM | POA: Diagnosis not present

## 2016-03-15 DIAGNOSIS — Z85038 Personal history of other malignant neoplasm of large intestine: Secondary | ICD-10-CM | POA: Insufficient documentation

## 2016-03-15 LAB — CBC WITH DIFFERENTIAL/PLATELET
BASOS PCT: 0 %
Basophils Absolute: 0 10*3/uL (ref 0.0–0.1)
EOS ABS: 0.3 10*3/uL (ref 0.0–0.7)
Eosinophils Relative: 3 %
HCT: 36.6 % (ref 36.0–46.0)
Hemoglobin: 11.9 g/dL — ABNORMAL LOW (ref 12.0–15.0)
LYMPHS ABS: 3 10*3/uL (ref 0.7–4.0)
Lymphocytes Relative: 31 %
MCH: 27.8 pg (ref 26.0–34.0)
MCHC: 32.5 g/dL (ref 30.0–36.0)
MCV: 85.5 fL (ref 78.0–100.0)
MONO ABS: 1 10*3/uL (ref 0.1–1.0)
Monocytes Relative: 10 %
NEUTROS PCT: 56 %
Neutro Abs: 5.5 10*3/uL (ref 1.7–7.7)
PLATELETS: 241 10*3/uL (ref 150–400)
RBC: 4.28 MIL/uL (ref 3.87–5.11)
RDW: 14.9 % (ref 11.5–15.5)
WBC: 9.8 10*3/uL (ref 4.0–10.5)

## 2016-03-15 LAB — BASIC METABOLIC PANEL
Anion gap: 10 (ref 5–15)
BUN: 18 mg/dL (ref 6–20)
CALCIUM: 7.9 mg/dL — AB (ref 8.9–10.3)
CO2: 25 mmol/L (ref 22–32)
CREATININE: 1.09 mg/dL — AB (ref 0.44–1.00)
Chloride: 105 mmol/L (ref 101–111)
GFR calc non Af Amer: 51 mL/min — ABNORMAL LOW (ref >60)
GFR, EST AFRICAN AMERICAN: 59 mL/min — AB (ref >60)
Glucose, Bld: 110 mg/dL — ABNORMAL HIGH (ref 65–99)
Potassium: 4.4 mmol/L (ref 3.5–5.1)
SODIUM: 140 mmol/L (ref 135–145)

## 2016-03-15 LAB — I-STAT TROPONIN, ED: TROPONIN I, POC: 0.01 ng/mL (ref 0.00–0.08)

## 2016-03-15 MED ORDER — SODIUM CHLORIDE 0.9 % IV BOLUS (SEPSIS)
1000.0000 mL | Freq: Once | INTRAVENOUS | Status: AC
  Administered 2016-03-15: 1000 mL via INTRAVENOUS

## 2016-03-15 MED ORDER — IOHEXOL 350 MG/ML SOLN: 100.0000 mL | Freq: Once | INTRAVENOUS | Status: DC | PRN

## 2016-03-15 MED ORDER — HYDROMORPHONE HCL 1 MG/ML IJ SOLN
1.0000 mg | Freq: Once | INTRAMUSCULAR | Status: AC
  Administered 2016-03-15: 1 mg via INTRAVENOUS
  Filled 2016-03-15: qty 1

## 2016-03-15 MED ORDER — METHOCARBAMOL 500 MG PO TABS
1000.0000 mg | ORAL_TABLET | Freq: Once | ORAL | Status: AC
  Administered 2016-03-15: 1000 mg via ORAL
  Filled 2016-03-15: qty 2

## 2016-03-15 MED ORDER — METHOCARBAMOL 500 MG PO TABS: 1000.0000 mg | ORAL_TABLET | Freq: Three times a day (TID) | ORAL | Status: DC | PRN

## 2016-03-15 NOTE — ED Notes (Signed)
Pt reports right arm pain, worsening last night. Pt states pain from right scapula/shoulder down through arm. Pt had cardiac catheterization in right wrist last Friday. Denies shortness of breath/cp/n/v/d.

## 2016-03-15 NOTE — ED Provider Notes (Signed)
Patient signed out from previous emergency physician pending CT angiogram of the right upper extremity. There was a delay in radiology is reading imaging study. No acute finding. CT demonstrated degenerative change of the right shoulder. Reexam of the patient reveals 2+ radial and ulnar pulses distally on the right. Patient has no swelling or tight compartments to the right upper extremity. It is warm with no pallor. Good cap refill. Patient has tenderness to palpation over the superior portion of the right shoulder and the lateral deltoid. Pain with range of motion. Patient states she's had rotator cuff repair in the past by Dr. Onnie Graham. Suspect there was some exacerbation of the patient's chronic arthritis or rotator cuff injury with positioning for cath. Low suspicion for infectious process. Patient has a normal white blood cell count. She is afebrile with no tachycardia. There is no warmth, redness at the shoulder. Placed in a sling for comfort. Patient will continue to take her Norco as needed for pain. She refused any further narcotics in the emergency department. We'll also start on Robaxin for possible muscular involvement. Return precautions have been given.  Julianne Rice, MD 03/15/16 [phone]

## 2016-03-15 NOTE — Progress Notes (Signed)
Orthopedic Tech Progress Note Patient Details:  [name] 09-27-1946 PO:[id number]  Ortho Devices Type of Ortho Device: Sling immobilizer Ortho Device/Splint Location: RUE Ortho Device/Splint Interventions: Ordered, Application   Braulio Bosch 03/15/2016, 8:16 PM

## 2016-03-15 NOTE — ED Notes (Signed)
This RN walked past room, family member in 64 and pt calling out from room. This RN entered room, pt states "this hospital treats me differently because I'm a foreigner." This RN explained that this was not appropriate and that the hospital staff treat patients equally. This RN explained delay to pt d/t wait for radiology to read CT scan. Pt states she had to go to restroom but no one came. This RN and Vikki Ports, RN were unaware of pt calling out to use restroom. Apologized for delay. Pt requesting to speak with manager. Lenna Sciara, charge RN aware

## 2016-03-15 NOTE — Progress Notes (Signed)
VASCULAR LAB PRELIMINARY  PRELIMINARY  PRELIMINARY  PRELIMINARY  Right upper extremity venous duplex completed.    Preliminary report:  There is no DVT or SVT noted in the right upper extremity.  There is no pseudoaneurysm or AV fistula noted.  Adine Heimann, RVT 03/15/2016, 12:21 PM

## 2016-03-15 NOTE — ED Notes (Signed)
Dr. Lita Mains at [facility] for team d/c with pt and family.

## 2016-03-15 NOTE — Telephone Encounter (Signed)
Spoke with pt and she states that the GI is not able to get her an appointment until May 3rd. Pt states that she is having some hand pain and some arm pain. Pt states that she wants to know if she will need to be seen or if she will need to come in for an appointment. Pt was offered an appointment this afternoon with Mackie Pai PA this afternoon but she declined and was offered next week with PCP and she declined as well. Pt will call back if she feels that she needs an appointment.

## 2016-03-15 NOTE — ED Provider Notes (Signed)
CSN: [id number]     Arrival date & time 03/15/16  1003 History   First MD Initiated Contact with Patient 03/15/16 1030     Chief Complaint  Patient presents with  . Arm Pain     (Consider location/radiation/quality/duration/timing/severity/associated sxs/prior Treatment) HPI  70 year old female presents with severe right arm pain. Started 6 days ago, 1 day after having a heart cath through her right radial artery. Pain has progressed and last night and today the pain is so severe she couldn't sleep and is in too much pain to move. No weakness/numbness. Is not sure if here upper arm is swollen. Pain is mostly in her upper arm and moves towards her right trapezius and right chest. No left sided chest pain/chest pressure. No dyspnea. Is on hydrocodone/APAP 10-325 mg for chronic pain. No relief with this.   Past Medical History  Diagnosis Date  . GERD (gastroesophageal reflux disease)     occasional  . Hyperlipidemia   . Hypertensive heart disease     a. Echocardiogram 11/19/11: Difficult acoustic windows, EF 60-65%, normal LV wall thickness, grade 1 diastolic dysfunction  . Vertigo   . Tubular adenoma of colon 06/2011  . Colitis   . Multinodular thyroid     Goiter s/p thyroidectomy in 2007 with post-op  hypocalcemia and post-op hypothyroidism/hypoparathyroidism with hypocalcemia  . CAD (coronary artery disease)     a. NSTEMI 10/2011: [place] 11/19/11: pLAD 30%, oOM 60%, AVCFX 30%, CFX after OM2 30%, pRCA 60 and 70%, then 99%, AM 80-90% with TIMI 3 flow.  PCI: Promus DES x 2 to RCA; b. 06/2012 Cath: patent RCA stents w/ subtl occl of Acute Marginal (jailed)->Med rx; c. 05/2015 MV: EF 59%, mod mid infsept/inf/ap lat/ap infarct with peri-infarct isch-->Med Rx; d. 02/2016 Cath: LM nl, LAD 70m, RI 50, RCA patent stents.  . Post-surgical hypothyroidism   . Hypoparathyroidism ([place])   . Palpitations     a. 03/2012 - patient set up for event monitor but did not wear correctly -  she declined wearing a  repeat monitor  . Unspecified constipation 05/31/2013  . Back pain 05/31/2013  . Nocturia 05/31/2013  . Neck pain on right side 07/13/2013  . Sun-damaged skin 12/05/2014  . Zinc deficiency 11/26/2015  . Pain in right axilla 03/13/2016   Past Surgical History  Procedure Laterality Date  . Colonoscopy  Aenomatous polyps    07/05/2011  . Total thyroidectomy  2007    GOITER  . Shoulder arthroscopy w/ rotator cuff repair      right  . Coronary angioplasty with stent placement    . Colonoscopy N/A 09/28/2014    Procedure: COLONOSCOPY;  Surgeon: Ladene Artist, MD;  Location: [facility];  Service: Endoscopy;  Laterality: N/A;  . Percutaneous coronary stent intervention (pci-s) N/A 11/19/2011    Procedure: PERCUTANEOUS CORONARY STENT INTERVENTION (PCI-S);  Surgeon: Hillary Bow, MD;  Location: [facility] CATH LAB;  Service: Cardiovascular;  Laterality: N/A;  . Left heart catheterization with coronary angiogram N/A 07/17/2012    Procedure: LEFT HEART CATHETERIZATION WITH CORONARY ANGIOGRAM;  Surgeon: Hillary Bow, MD;  Location: [facility] C[facility] LAB;  Service: Cardiovascular;  Laterality: N/A;  . Cardiac catheterization N/A 03/08/2016    Procedure: Left Heart Cath and Coronary Angiography;  Surgeon: Jettie Booze, MD;  Location: [facility] CV LAB;  Service: Cardiovascular;  Laterality: N/A;   Family History  Problem Relation Age of Onset  . Colon cancer Brother   . Cancer Brother  COLON  . Hypertension Father   . Heart disease Father   . Diabetes Neg Hx   . Prostate cancer Neg Hx   . Breast cancer Neg Hx   . Blindness Sister   . Congestive Heart Failure Sister   . Hypertension Sister   . Thyroid disease Sister   . Cancer Brother     multiple myelomas   Social History  Substance Use Topics  . Smoking status: Never Smoker   . Smokeless tobacco: Never Used  . Alcohol Use: No   OB History    Gravida Para Term Preterm AB TAB SAB Ectopic Multiple Living   4 4 4       4      Review of  Systems  Respiratory: Negative for shortness of breath.   Cardiovascular: Positive for chest pain (right sided).  Musculoskeletal: Positive for myalgias and arthralgias.  Skin: Negative for color change.  Neurological: Negative for weakness and numbness.  All other systems reviewed and are negative.     Allergies  Zetia  Home Medications   Prior to Admission medications   Medication Sig Start Date End Date Taking? Authorizing Provider  amLODipine (NORVASC) 10 MG tablet Take 1 tablet (10 mg total) by mouth daily. 03/13/16   Mosie Lukes, MD  aspirin EC 81 MG tablet Take 81 mg by mouth daily.   11/22/11   Dayna N Dunn, PA-C  B Complex-C (B-COMPLEX WITH VITAMIN C) tablet Take 1 tablet by mouth daily.    Historical Provider, MD  calcitRIOL (ROCALTROL) 0.25 MCG capsule Take 3 capsules (0.75 mcg total) by mouth daily. Take 3 cap daily 09/12/15   Mosie Lukes, MD  calcium carbonate (TUMS - DOSED IN MG ELEMENTAL CALCIUM) 500 MG chewable tablet Chew 4 tablets by mouth 2 (two) times daily.     Historical Provider, MD  cholecalciferol (VITAMIN D) 1000 UNITS tablet Take 1,000 Units by mouth daily.    Historical Provider, MD  clopidogrel (PLAVIX) 75 MG tablet Take 1 tablet (75 mg total) by mouth daily. 03/13/16   Mosie Lukes, MD  diazepam (VALIUM) 5 MG tablet Take 5 mg by mouth at bedtime as needed. (SLEEP) 07/01/15   Historical Provider, MD  famotidine (PEPCID) 20 MG tablet Take 1 tablet by mouth two  times daily 09/12/15   Mosie Lukes, MD  gabapentin (NEURONTIN) 100 MG capsule Take 3 capsules (300 mg total) by mouth 3 (three) times daily. 02/21/16   Marcial Pacas, MD  isosorbide mononitrate (IMDUR) 60 MG 24 hr tablet Take 1 1/2 tablets by mouth daily - total of 90mg . 01/23/16   Fay Records, MD  levothyroxine (SYNTHROID, LEVOTHROID) 100 MCG tablet Take 1 tablet (100 mcg total) by mouth daily. 01/30/16   Mosie Lukes, MD  Menthol, Topical Analgesic, (ICY HOT EX) Apply 1 patch topically daily.     Historical Provider, MD  nitroGLYCERIN (NITROSTAT) 0.4 MG SL tablet Dissolve 1 tablet under the tongue every 5 minutes as   needed for chest pain as   directed 03/13/16   Mosie Lukes, MD  pantoprazole (PROTONIX) 20 MG tablet Take 1 tablet (20 mg total) by mouth daily. 03/13/16   Mosie Lukes, MD  rosuvastatin (CRESTOR) 40 MG tablet Take 1 tablet (40 mg total) by mouth daily. 03/13/16   Mosie Lukes, MD   BP 120/61 mmHg  Pulse 60  Temp(Src) 97.6 F (36.4 C) (Oral)  Resp 13  SpO2 95% Physical Exam  Constitutional: She  is oriented to person, place, and time. She appears well-developed and well-nourished.  HENT:  Head: Normocephalic and atraumatic.  Right Ear: External ear normal.  Left Ear: External ear normal.  Nose: Nose normal.  Eyes: Right eye exhibits no discharge. Left eye exhibits no discharge.  Cardiovascular: Normal rate, regular rhythm and normal heart sounds.   Pulses:      Radial pulses are 2+ on the right side.  Normal ulnar pulse RUE  Pulmonary/Chest: Effort normal and breath sounds normal.    Abdominal: She exhibits no distension. There is no tenderness.  Musculoskeletal:       Right upper arm: She exhibits tenderness (diffuse upper arm/shoulder tenderness to minimal palpation) and swelling (upper arm seems a little more swollen than left).       Arms: Equal strength/normal sensation in bilateral arms  Neurological: She is alert and oriented to person, place, and time.  Skin: Skin is warm and dry.  Normal capillary refill bilateral hands  Nursing note and vitals reviewed.   ED Course  Procedures (including critical care time) Labs Review Labs Reviewed  BASIC METABOLIC PANEL - Abnormal; Notable for the following:    Glucose, Bld 110 (*)    Creatinine, Ser 1.09 (*)    Calcium 7.9 (*)    GFR calc non Af Amer 51 (*)    GFR calc Af Amer 59 (*)    All other components within normal limits  CBC WITH DIFFERENTIAL/PLATELET - Abnormal; Notable for the following:     Hemoglobin 11.9 (*)    All other components within normal limits  I-STAT TROPOININ, ED    Imaging Review No results found. I have personally reviewed and evaluated these images and lab results as part of my medical decision-making.  Expand All Collapse All   VASCULAR LAB PRELIMINARY PRELIMINARY PRELIMINARY PRELIMINARY  Right upper extremity venous duplex completed.   Preliminary report: There is no DVT or SVT noted in the right upper extremity. There is no pseudoaneurysm or AV fistula noted.  Sharion Dove, RVT 03/15/2016, 12:21 PM          EKG Interpretation   Date/Time:  Thursday March 15 2016 10:12:55 EDT Ventricular Rate:  66 PR Interval:  172 QRS Duration: 101 QT Interval:  436 QTC Calculation: 457 R Axis:   41 Text Interpretation:  Sinus rhythm Low voltage, precordial leads  Nonspecific T abnrm, anterolateral leads Baseline wander otherwise nos  ignificant change since Mar 08 2016 Confirmed by Regenia Skeeter  MD, [place]  [phone]) on 03/15/2016 10:25:15 AM      MDM   Final diagnoses:  Right arm pain    Patient has strong radial pulse in right arm. She does have swelling and severe tenderness to light palpation. However I feel her compartment seems soft and do not think this is compartment syndrome. U/S shows no DVT. Given there appears to be some swelling, will get a CT angio of her arm to r/o partial arterial injury/bleeding/hematoma. Neuro exam unremarkable. Pain improved but still present after IV dilaudid. Care to Dr. Lita Mains with CT pending.     Sherwood Gambler, MD 03/15/16 [phone]

## 2016-03-15 NOTE — Discharge Instructions (Signed)
Wear sling for comfort. You may continue to take your hydrocodone as prescribed for pain. You may also take Robaxin as needed. Call and make an appointment to follow-up with Dr. Onnie Graham.  Osteoarthritis Osteoarthritis is a disease that causes soreness and inflammation of a joint. It occurs when the cartilage at the affected joint wears down. Cartilage acts as a cushion, covering the ends of bones where they meet to form a joint. Osteoarthritis is the most common form of arthritis. It often occurs in older people. The joints affected most often by this condition include those in the:  Ends of the fingers.  Thumbs.  Neck.  Lower back.  Knees.  Hips. CAUSES  Over time, the cartilage that covers the ends of bones begins to wear away. This causes bone to rub on bone, producing pain and stiffness in the affected joints.  RISK FACTORS Certain factors can increase your chances of having osteoarthritis, including:  Older age.  Excessive body weight.  Overuse of joints.  Previous joint injury. SIGNS AND SYMPTOMS   Pain, swelling, and stiffness in the joint.  Over time, the joint may lose its normal shape.  Small deposits of bone (osteophytes) may grow on the edges of the joint.  Bits of bone or cartilage can break off and float inside the joint space. This may cause more pain and damage. DIAGNOSIS  Your health care provider will do a physical exam and ask about your symptoms. Various tests may be ordered, such as:  X-rays of the affected joint.  Blood tests to rule out other types of arthritis. Additional tests may be used to diagnose your condition. TREATMENT  Goals of treatment are to control pain and improve joint function. Treatment plans may include:  A prescribed exercise program that allows for rest and joint relief.  A weight control plan.  Pain relief techniques, such as:  Properly applied heat and cold.  Electric pulses delivered to nerve endings under the skin  (transcutaneous electrical nerve stimulation [TENS]).  Massage.  Certain nutritional supplements.  Medicines to control pain, such as:  Acetaminophen.  Nonsteroidal anti-inflammatory drugs (NSAIDs), such as naproxen.  Narcotic or central-acting agents, such as tramadol.  Corticosteroids. These can be given orally or as an injection.  Surgery to reposition the bones and relieve pain (osteotomy) or to remove loose pieces of bone and cartilage. Joint replacement may be needed in advanced states of osteoarthritis. HOME CARE INSTRUCTIONS   Take medicines only as directed by your health care provider.  Maintain a healthy weight. Follow your health care provider's instructions for weight control. This may include dietary instructions.  Exercise as directed. Your health care provider can recommend specific types of exercise. These may include:  Strengthening exercises. These are done to strengthen the muscles that support joints affected by arthritis. They can be performed with weights or with exercise bands to add resistance.  Aerobic activities. These are exercises, such as brisk walking or low-impact aerobics, that get your heart pumping.  Range-of-motion activities. These keep your joints limber.  Balance and agility exercises. These help you maintain daily living skills.  Rest your affected joints as directed by your health care provider.  Keep all follow-up visits as directed by your health care provider. SEEK MEDICAL CARE IF:   Your skin turns red.  You develop a rash in addition to your joint pain.  You have worsening joint pain.  You have a fever along with joint or muscle aches. SEEK IMMEDIATE MEDICAL CARE IF:  You have a significant loss of weight or appetite.  You have night sweats. [place] of Arthritis and Musculoskeletal and Skin Diseases: www.niams.SouthExposed.es  [employer] on Aging: http://kim-miller.com/  American College  of Rheumatology: www.rheumatology.org   This information is not intended to replace advice given to you by your health care provider. Make sure you discuss any questions you have with your health care provider.   Document Released: 12/10/2005 Document Revised: 12/31/2014 Document Reviewed: 08/17/2013 Elsevier Interactive Patient Education 2016 Elsevier Inc. Shoulder Pain The shoulder is the joint that connects your arms to your body. The bones that form the shoulder joint include the upper arm bone (humerus), the shoulder blade (scapula), and the collarbone (clavicle). The top of the humerus is shaped like a ball and fits into a rather flat socket on the scapula (glenoid cavity). A combination of muscles and strong, fibrous tissues that connect muscles to bones (tendons) support your shoulder joint and hold the ball in the socket. Small, fluid-filled sacs (bursae) are located in different areas of the joint. They act as cushions between the bones and the overlying soft tissues and help reduce friction between the gliding tendons and the bone as you move your arm. Your shoulder joint allows a wide range of motion in your arm. This range of motion allows you to do things like scratch your back or throw a ball. However, this range of motion also makes your shoulder more prone to pain from overuse and injury. Causes of shoulder pain can originate from both injury and overuse and usually can be grouped in the following four categories:  Redness, swelling, and pain (inflammation) of the tendon (tendinitis) or the bursae (bursitis).  Instability, such as a dislocation of the joint.  Inflammation of the joint (arthritis).  Broken bone (fracture). HOME CARE INSTRUCTIONS   Apply ice to the sore area.  Put ice in a plastic bag.  Place a towel between your skin and the bag.  Leave the ice on for 15-20 minutes, 3-4 times per day for the first 2 days, or as directed by your health care provider.  Stop  using cold packs if they do not help with the pain.  If you have a shoulder sling or immobilizer, wear it as long as your caregiver instructs. Only remove it to shower or bathe. Move your arm as little as possible, but keep your hand moving to prevent swelling.  Squeeze a soft ball or foam pad as much as possible to help prevent swelling.  Only take over-the-counter or prescription medicines for pain, discomfort, or fever as directed by your caregiver. SEEK MEDICAL CARE IF:   Your shoulder pain increases, or new pain develops in your arm, hand, or fingers.  Your hand or fingers become cold and numb.  Your pain is not relieved with medicines. SEEK IMMEDIATE MEDICAL CARE IF:   Your arm, hand, or fingers are numb or tingling.  Your arm, hand, or fingers are significantly swollen or turn white or blue. MAKE SURE YOU:   Understand these instructions.  Will watch your condition.  Will get help right away if you are not doing well or get worse.   This information is not intended to replace advice given to you by your health care provider. Make sure you discuss any questions you have with your health care provider.   Document Released: 09/19/2005 Document Revised: 12/31/2014 Document Reviewed: 04/04/2015 Elsevier Interactive Patient Education [employer].

## 2016-03-15 NOTE — ED Notes (Signed)
[facility] at [facility] to place R arm sling. Pt dressed and ready to go, family x2 present and updated. VSS.

## 2016-03-15 NOTE — ED Notes (Addendum)
Imaging resulted, EDP aware, pt notified and updated. Pt alert, NAD, calm, interactive, resps e/u, speaking in clear complete sentences, no dyspnea noted, speech clear.  Husband and EMT at [facility].

## 2016-03-15 NOTE — Telephone Encounter (Signed)
Entered in error

## 2016-03-15 NOTE — ED Notes (Addendum)
Ortho tech paged, pending arrival.

## 2016-03-16 ENCOUNTER — Telehealth: Payer: Self-pay | Admitting: Family Medicine

## 2016-03-16 ENCOUNTER — Other Ambulatory Visit: Payer: Self-pay | Admitting: Family Medicine

## 2016-03-16 DIAGNOSIS — M50323 Other cervical disc degeneration at C6-C7 level: Secondary | ICD-10-CM | POA: Diagnosis not present

## 2016-03-16 DIAGNOSIS — M542 Cervicalgia: Secondary | ICD-10-CM | POA: Diagnosis not present

## 2016-03-16 DIAGNOSIS — M50321 Other cervical disc degeneration at C4-C5 level: Secondary | ICD-10-CM | POA: Diagnosis not present

## 2016-03-16 DIAGNOSIS — M79601 Pain in right arm: Secondary | ICD-10-CM | POA: Diagnosis not present

## 2016-03-16 DIAGNOSIS — M25519 Pain in unspecified shoulder: Secondary | ICD-10-CM

## 2016-03-16 NOTE — Telephone Encounter (Signed)
Caller name: Drina Relation to pt: self Call back number: [phone] Pharmacy:  Reason for call: Pt called requesting needs referral to her Orthopedics Doctor [place] ([facility]), pt states is in severe pain on her shoulder, pt mentioned her PCP already aware of situation and would like to be referral today or ASAP. Please advise.

## 2016-03-20 MED ORDER — IOHEXOL 350 MG/ML SOLN: 100.0000 mL | Freq: Once | INTRAVENOUS | Status: DC | PRN

## 2016-03-20 NOTE — Progress Notes (Signed)
This encounter was created in error - please disregard.

## 2016-03-22 ENCOUNTER — Encounter: Payer: Medicare Other | Admitting: Physician Assistant

## 2016-03-22 DIAGNOSIS — Q72811 Congenital shortening of right lower limb: Secondary | ICD-10-CM | POA: Diagnosis not present

## 2016-03-22 DIAGNOSIS — M9905 Segmental and somatic dysfunction of pelvic region: Secondary | ICD-10-CM | POA: Diagnosis not present

## 2016-03-22 DIAGNOSIS — M50322 Other cervical disc degeneration at C5-C6 level: Secondary | ICD-10-CM | POA: Diagnosis not present

## 2016-03-22 DIAGNOSIS — M9902 Segmental and somatic dysfunction of thoracic region: Secondary | ICD-10-CM | POA: Diagnosis not present

## 2016-03-22 DIAGNOSIS — M9901 Segmental and somatic dysfunction of cervical region: Secondary | ICD-10-CM | POA: Diagnosis not present

## 2016-03-23 DIAGNOSIS — M542 Cervicalgia: Secondary | ICD-10-CM | POA: Diagnosis not present

## 2016-03-23 DIAGNOSIS — M79601 Pain in right arm: Secondary | ICD-10-CM | POA: Diagnosis not present

## 2016-03-23 DIAGNOSIS — M50321 Other cervical disc degeneration at C4-C5 level: Secondary | ICD-10-CM | POA: Diagnosis not present

## 2016-03-23 DIAGNOSIS — M50323 Other cervical disc degeneration at C6-C7 level: Secondary | ICD-10-CM | POA: Diagnosis not present

## 2016-03-26 ENCOUNTER — Encounter: Payer: Self-pay | Admitting: Physician Assistant

## 2016-03-26 ENCOUNTER — Ambulatory Visit (INDEPENDENT_AMBULATORY_CARE_PROVIDER_SITE_OTHER): Payer: Medicare Other | Admitting: Physician Assistant

## 2016-03-26 VITALS — BP 120/60 | HR 68 | Ht 64.0 in | Wt 198.2 lb

## 2016-03-26 DIAGNOSIS — I2510 Atherosclerotic heart disease of native coronary artery without angina pectoris: Secondary | ICD-10-CM | POA: Diagnosis not present

## 2016-03-26 DIAGNOSIS — M25511 Pain in right shoulder: Secondary | ICD-10-CM

## 2016-03-26 NOTE — Patient Instructions (Signed)
Medication Instructions:   Your physician recommends that you continue on your current medications as directed. Please refer to the Current Medication list given to you today.   If you need a refill on your cardiac medications before your next appointment, please call your pharmacy.  Labwork: NONE ORDER TODAY    Testing/Procedures: NONE ORDER TODAY    Follow-Up:  Your physician wants you to follow-up in:  [place] will receive a reminder letter in the mail two months in advance. If you don't receive a letter, please call our office to schedule the follow-up appointment.      Any Other Special Instructions Will Be Listed Below (If Applicable).

## 2016-03-26 NOTE — Progress Notes (Signed)
Cardiology Office Note   Date:  03/26/2016   ID:  [name], DOB 1946/03/25, MRN [id number]  PCP:  Penni Homans, MD  Cardiologist:  Dr. Harrington Challenger  Chief Complaint  Patient presents with  . Hospitalization Follow-up    seen for Dr. Harrington Challenger, post cath no PCI stable CAD      History of Present Illness: [name] is a 70 y.o. female who presents for post hospital follow-up. The patient has a history of CAD s/p NSTEMI with DES 2 to RCA in 10/2011, HTN, HLD, postsurgical hypothyroidism, hypoparathyroidism and GERD. She previously had a repeat left heart cath on 07/17/2012 which showed patent RCA stent with subtotal occlusion of acute marginal which was jailed. She was placed on medical management. Myoview obtained in June 2016 shows EF normal, medium defect of moderate severity present in the mid inferoseptal, mid inferior, apical lateral and apex location, finding consistent with prior infarct with perinfarct ischemia. She recently was admitted to the hospital on 03/07/2016 for evaluation of chest pain that was concerning for unstable angina. She underwent cardiac catheterization on 03/08/2016 which showed patent stents in the RCA, nonobstructive CAD, EF 55-65%. Echocardiogram obtained on the same day shows EF 60-65%, mild LVH, grade 1 diastolic dysfunction, no RWMA. She was discharged on the same day. She was seen by her PCP on 3/21 at which time she was doing well from cardiac perspective, however she was complaining of pain in her right axilla. Upper extremity venous duplex was obtained which showed no DVT, pseudoaneurysm or AV fistula in the right upper extremity. She was seen in the ED on 3/23 for right shoulder pain him a CT demonstrated degenerative changes in the right shoulder, however no acute finding. Apparently patient had tenderness to palpation with the superior portion of the right shoulder and lateral deltoid and she had pain with range of motion. Was suspected that she has some  exacerbation of chronic arthritis or rotator cuff injury was positioning with recent cath. She was placed in a sling for comfort and discharged from the ED.  She presents today for follow-up, she continued to have tenderness with rotation of right shoulder, and also has tenderness on palpation. Otherwise she denies any chest discomfort. She is otherwise doing well. Her shoulder pain has been gradually improving.    Past Medical History  Diagnosis Date  . GERD (gastroesophageal reflux disease)     occasional  . Hyperlipidemia   . Hypertensive heart disease     a. Echocardiogram 11/19/11: Difficult acoustic windows, EF 60-65%, normal LV wall thickness, grade 1 diastolic dysfunction  . Vertigo   . Tubular adenoma of colon 06/2011  . Colitis   . Multinodular thyroid     Goiter s/p thyroidectomy in 2007 with post-op  hypocalcemia and post-op hypothyroidism/hypoparathyroidism with hypocalcemia  . CAD (coronary artery disease)     a. NSTEMI 10/2011: [place] 11/19/11: pLAD 30%, oOM 60%, AVCFX 30%, CFX after OM2 30%, pRCA 60 and 70%, then 99%, AM 80-90% with TIMI 3 flow.  PCI: Promus DES x 2 to RCA; b. 06/2012 Cath: patent RCA stents w/ subtl occl of Acute Marginal (jailed)->Med rx; c. 05/2015 MV: EF 59%, mod mid infsept/inf/ap lat/ap infarct with peri-infarct isch-->Med Rx; d. 02/2016 Cath: LM nl, LAD 58m, RI 50, RCA patent stents.  . Post-surgical hypothyroidism   . Hypoparathyroidism ([place])   . Palpitations     a. 03/2012 - patient set up for event monitor but did not wear correctly -  she declined wearing a repeat monitor  . Unspecified constipation 05/31/2013  . Back pain 05/31/2013  . Nocturia 05/31/2013  . Neck pain on right side 07/13/2013  . Sun-damaged skin 12/05/2014  . Zinc deficiency 11/26/2015  . Pain in right axilla 03/13/2016    Past Surgical History  Procedure Laterality Date  . Colonoscopy  Aenomatous polyps    07/05/2011  . Total thyroidectomy  2007    GOITER  . Shoulder arthroscopy w/  rotator cuff repair      right  . Coronary angioplasty with stent placement    . Colonoscopy N/A 09/28/2014    Procedure: COLONOSCOPY;  Surgeon: Ladene Artist, MD;  Location: [facility] ENDOSCOPY;  Service: Endoscopy;  Laterality: N/A;  . Percutaneous coronary stent intervention (pci-s) N/A 11/19/2011    Procedure: PERCUTANEOUS CORONARY STENT INTERVENTION (PCI-S);  Surgeon: Hillary Bow, MD;  Location: [facility] CATH LAB;  Service: Cardiovascular;  Laterality: N/A;  . Left heart catheterization with coronary angiogram N/A 07/17/2012    Procedure: LEFT HEART CATHETERIZATION WITH CORONARY ANGIOGRAM;  Surgeon: Hillary Bow, MD;  Location: [facility] CATH LAB;  Service: Cardiovascular;  Laterality: N/A;  . Cardiac catheterization N/A 03/08/2016    Procedure: Left Heart Cath and Coronary Angiography;  Surgeon: Jettie Booze, MD;  Location: [facility] CV LAB;  Service: Cardiovascular;  Laterality: N/A;     Current Outpatient Prescriptions  Medication Sig Dispense Refill  . amLODipine (NORVASC) 10 MG tablet Take 1 tablet (10 mg total) by mouth daily. 90 tablet 3  . aspirin EC 81 MG tablet Take 81 mg by mouth daily.      . B Complex-C (B-COMPLEX WITH VITAMIN C) tablet Take 1 tablet by mouth daily.    . calcitRIOL (ROCALTROL) 0.25 MCG capsule Take 3 capsules (0.75 mcg total) by mouth daily. Take 3 cap daily 270 capsule 1  . calcium carbonate (TUMS - DOSED IN MG ELEMENTAL CALCIUM) 500 MG chewable tablet Chew 4 tablets by mouth 2 (two) times daily.     . cholecalciferol (VITAMIN D) 1000 UNITS tablet Take 1,000 Units by mouth daily.    . clopidogrel (PLAVIX) 75 MG tablet Take 1 tablet (75 mg total) by mouth daily. 90 tablet 3  . diazepam (VALIUM) 5 MG tablet Take 5 mg by mouth at bedtime as needed. (SLEEP)  0  . famotidine (PEPCID) 20 MG tablet Take 1 tablet by mouth two  times daily 180 tablet 1  . gabapentin (NEURONTIN) 100 MG capsule Take 3 capsules (300 mg total) by mouth 3 (three) times daily. 270 capsule  11  . isosorbide mononitrate (IMDUR) 60 MG 24 hr tablet Take 90 mg by mouth daily. TAKE 1 1/2 TABS BY MOUTH DAILY.    Marland Kitchen levothyroxine (SYNTHROID, LEVOTHROID) 100 MCG tablet Take 1 tablet (100 mcg total) by mouth daily. 30 tablet 3  . Menthol, Topical Analgesic, (ICY HOT EX) Apply 1 patch topically daily.    . methocarbamol (ROBAXIN) 500 MG tablet Take 2 tablets (1,000 mg total) by mouth every 8 (eight) hours as needed (shoulder pain). 30 tablet 0  . nitroGLYCERIN (NITROSTAT) 0.4 MG SL tablet Dissolve 1 tablet under the tongue every 5 minutes as   needed for chest pain as   directed 25 tablet 2  . pantoprazole (PROTONIX) 20 MG tablet Take 1 tablet (20 mg total) by mouth daily. 30 tablet 2  . rosuvastatin (CRESTOR) 40 MG tablet Take 1 tablet (40 mg total) by mouth daily. 30 tablet 6   No  current facility-administered medications for this visit.   Facility-Administered Medications Ordered in Other Visits  Medication Dose Route Frequency Provider Last Rate Last Dose  . iohexol (OMNIPAQUE) 350 MG/ML injection 100 mL  100 mL Intravenous Once PRN Sherwood Gambler, MD        Allergies:   Zetia    Social History:  The patient  reports that she has never smoked. She has never used smokeless tobacco. She reports that she does not drink alcohol or use illicit drugs.   Family History:  The patient's family history includes Blindness in her sister; Cancer in her brother and brother; Colon cancer in her brother; Congestive Heart Failure in her sister; Heart attack in her father; Heart disease in her father; Hypertension in her father and sister; Thyroid disease in her sister. There is no history of Diabetes, Prostate cancer, or Breast cancer.    ROS:  Please see the history of present illness.   Otherwise, review of systems are positive for Right shoulder pain.   All other systems are reviewed and negative.    PHYSICAL EXAM: VS:  BP 120/60 mmHg  Pulse 68  Ht 5\' 4"  (1.626 m)  Wt 198 lb 3.2 oz (89.903  kg)  BMI 34.00 kg/m2 , BMI Body mass index is 34 kg/(m^2). GEN: Well nourished, well developed, in no acute distress HEENT: normal Neck: no JVD, carotid bruits, or masses Cardiac: RRR; no murmurs, rubs, or gallops,no edema  Respiratory:  clear to auscultation bilaterally, normal work of breathing GI: soft, nontender, nondistended, + BS MS: no deformity or atrophy Skin: warm and dry, no rash Neuro:  Strength and sensation are intact Psych: euthymic mood, full affect   EKG:  EKG is not ordered today.   Recent Labs: 03/13/2016: ALT 19; TSH 3.79 03/15/2016: BUN 18; Creatinine, Ser 1.09*; Hemoglobin 11.9*; Platelets 241; Potassium 4.4; Sodium 140    Lipid Panel    Component Value Date/Time   CHOL 171 01/23/2016 1013   TRIG 108 01/23/2016 1013   HDL 48 01/23/2016 1013   CHOLHDL 3.6 01/23/2016 1013   VLDL 22 01/23/2016 1013   LDLCALC 101 01/23/2016 1013   LDLDIRECT 83.8 08/11/2012 1203      Wt Readings from Last 3 Encounters:  03/26/16 198 lb 3.2 oz (89.903 kg)  03/13/16 202 lb (91.627 kg)  03/08/16 197 lb 11.2 oz (89.676 kg)      Other studies Reviewed: Additional studies/ records that were reviewed today include:   Cath 03/08/2016 Conclusion     Patent stents in the RCA.  Nonobstructive disease in the left system.  The left ventricular systolic function is normal.  Continue medical therapy.   Echo 03/08/2016 LV EF: 60% - 65%  ------------------------------------------------------------------- Indications: Chest pain 786.51.  ------------------------------------------------------------------- History: PMH: Palpitations. Coronary artery disease. Risk factors: Hypertension. Dyslipidemia.  ------------------------------------------------------------------- Study Conclusions  - Left ventricle: The cavity size was normal. Wall thickness was  increased in a pattern of mild LVH. Systolic function was normal.  The estimated ejection fraction  was in the range of 60% to 65%.  Wall motion was normal; there were no regional wall motion  abnormalities. Doppler parameters are consistent with abnormal  left ventricular relaxation (grade 1 diastolic dysfunction). The  E/e&' ratio is between 8-15, suggesting indeterminate LV filling  pressure. - Mitral valve: Mildly thickened leaflets . There was trivial  regurgitation. - Left atrium: The atrium was normal in size.  Impressions:  - LVEF 60-65%, mild LVH, normal wall motion, diastolic dysfunction,  indeterminate  LV filling pressure, mildly thickened mitral  leaflets with trivial MR - no prolapse, normal LA size.    Review of the above records demonstrates:   Recently presented to the hospital with chest pain, cardiac cath shows no significant CAD, previously patent stent. Echocardiogram shows normal cardiac function.    ASSESSMENT AND PLAN:  1. CAD s/p NSTEMI with DES 2 to RCA in 10/2011  - cardiac catheterization on 03/08/2016 which showed patent stents in the RCA, nonobstructive CAD, EF 55-65%. Echocardiogram obtained on the same day shows EF 60-65%, mild LVH, grade 1 diastolic dysfunction, no RWMA.  - No further workup is needed for recent chest pain. I have given her a work note some by Dr. Harrington Challenger and me to limit work to between [place] to [employer].   2. HTN: Well controlled with systolic blood pressure 123456 today  3. HLD: on crestor  4. postsurgical hypothyroidism and hypoparathyroidism: recent TSH WNL  5. Pressure pain: Obvious tenderness on palpation, likely musculoskeletal   Current medicines are reviewed at length with the patient today.  The patient does not have concerns regarding medicines.  The following changes have been made:  no change  Labs/ tests ordered today include:  No orders of the defined types were placed in this encounter.     Disposition:   FU with Dr. Harrington Challenger in 6 months  Signed, Almyra Deforest, [place]  03/26/2016 6:45 PM    [facility] [address], [place], [place]  [address] Phone: [phone]; Fax: [phone]

## 2016-03-27 ENCOUNTER — Encounter: Payer: Self-pay | Admitting: Gastroenterology

## 2016-03-27 ENCOUNTER — Ambulatory Visit (INDEPENDENT_AMBULATORY_CARE_PROVIDER_SITE_OTHER): Payer: Medicare Other | Admitting: Gastroenterology

## 2016-03-27 ENCOUNTER — Telehealth: Payer: Self-pay | Admitting: *Deleted

## 2016-03-27 VITALS — BP 120/70 | HR 70 | Ht 64.0 in | Wt 198.0 lb

## 2016-03-27 DIAGNOSIS — K219 Gastro-esophageal reflux disease without esophagitis: Secondary | ICD-10-CM

## 2016-03-27 DIAGNOSIS — Z7902 Long term (current) use of antithrombotics/antiplatelets: Secondary | ICD-10-CM | POA: Insufficient documentation

## 2016-03-27 DIAGNOSIS — R0789 Other chest pain: Secondary | ICD-10-CM | POA: Diagnosis not present

## 2016-03-27 NOTE — Telephone Encounter (Signed)
Pt is 5 years out from stents to RCA Should be OK to actually stop Plavix   Keep on ASA 81

## 2016-03-27 NOTE — Progress Notes (Signed)
Reviewed and agree with management plan.  Malcolm T. Stark, MD FACG 

## 2016-03-27 NOTE — Telephone Encounter (Signed)
  03/27/2016   RE: [name] DOB: 1946/10/30 MRN: [id number]   Dear Dr. Dorris Carnes,    We have scheduled the above patient for an endoscopic procedure. Our records show that she is on anticoagulation therapy.   Please advise as to how long the patient may come off her therapy of Plavix prior to the procedure, which is scheduled for 04-03-2016.  Please fax back/ or route the completed form to [facility] at [phone].   Sincerely,    Alonza Bogus PA-C

## 2016-03-27 NOTE — Telephone Encounter (Signed)
OK to hold plavix prior to the procedure  Pt is over 1 year from intervention  Resume after

## 2016-03-27 NOTE — Patient Instructions (Addendum)
You have been scheduled for an endoscopy. Please follow written instructions given to you at your visit today. If you use inhalers (even only as needed), please bring them with you on the day of your procedure. Your physician has requested that you go to www.startemmi.com and enter the access code given to you at your visit today. This web site gives a general overview about your procedure. However, you should still follow specific instructions given to you by our office regarding your preparation for the procedure.  We will call you after we hear from Dr. Harrington Challenger regarding the Plavix medication instructions.        If you are age 22 or older, your body mass index should be between 23-30. Your Body mass index is 33.97 kg/(m^2). If this is out of the aforementioned range listed, please consider follow up with your Primary Care Provider.

## 2016-03-27 NOTE — Progress Notes (Signed)
     03/27/2016 Kallan Herriman Seigler [id number] [birth year]   History of Present Illness:  This is a 70 year old female known to Dr. Fuller Plan for colonoscopies.  She is here today to discuss EGD at the recommendation of her PCP for evaluation regarding complaints of chest pain.  The patient reports having chest pain that was described as a pressure or heaviness in her chest and was associated with belching. She went to the emergency department and was admitted to the hospital for 2 days and underwent extensive evaluation including cardiac catheterization. There were no changes or new issues from a cardiac standpoint. She is on Plavix for stent placements that she had in 2012. Due to negative cardiac evaluation, her PCP has referred her here for consideration of EGD to evaluate for GI causes of chest pain, mainly reflux. She does take Pepcid 20 mg twice daily mostly on a regular basis. She was actually seen for reflux complaints by Dr. Fuller Plan back in 2012 and was scheduled for an EGD at that point, but did not proceed. She denies any further chest pain at this point and tells me that she really does not feel it she gets significant reflux unless she eats chocolate or cheese particularly after a certain time in the evening.   Current Medications, Allergies, Past Medical History, Past Surgical History, Family History and Social History were reviewed in [employer] record.   Physical Exam: BP 120/70 mmHg  Pulse 70  Ht 5\' 4"  (1.626 m)  Wt 198 lb (89.812 kg)  BMI 33.97 kg/m2 General: Well developed female in no acute distress Head: Normocephalic and atraumatic Eyes:  Sclerae anicteric, conjunctiva pink  Ears: Normal auditory acuity Lungs: Clear throughout to auscultation Heart: Regular rate and rhythm Abdomen: Soft, non-distended.  Normal bowel sounds.  Non-tender. Musculoskeletal: Symmetrical with no gross deformities  Extremities: No edema  Neurological: Alert oriented x 4,  grossly non-focal Psychological:  Alert and cooperative. Normal mood and affect  Assessment and Recommendations: -Atypical chest pain and GERD:  Pain now resolved.  Underwent extensive cardiac evaluation including cath without any issues.  ? GI/reflux related.  Was scheduled for EGD in 2012 but did not proceed.  Will schedule with Dr. Fuller Plan.  Will just continue her BID famotidine for now. -History of CAD with DES placed in 2012:  On Plavix.  Hold Plavix for 5 days before procedure - will instruct when and how to resume after procedure. Risks and benefits of procedure including bleeding, perforation, infection, missed lesions, medication reactions and possible hospitalization or surgery if complications occur explained. Additional rare but real risk of cardiovascular event such as heart attack or ischemia/infarct of other organs off of Plavix explained and need to seek urgent help if this occurs. Will communicate by phone or EMR with patient's prescribing provider, Dr. Harrington Challenger, to confirm that holding Plavix is reasonable in this case.

## 2016-03-28 ENCOUNTER — Telehealth: Payer: Self-pay | Admitting: *Deleted

## 2016-03-28 ENCOUNTER — Telehealth: Payer: Self-pay | Admitting: Internal Medicine

## 2016-03-28 NOTE — Telephone Encounter (Signed)
Received message from Dr. Alan Ripper office.  They called the patient and advised her to stop he plavix and continue the aspirin. I advised her to hold it on 03-29-2016.  Patient verbalized understanding the instructions.

## 2016-03-28 NOTE — Telephone Encounter (Signed)
See phone note 03/27/16

## 2016-03-28 NOTE — Telephone Encounter (Signed)
Follow up ° ° °Pt is returning call for rn  °

## 2016-03-28 NOTE — Telephone Encounter (Signed)
Left a message for patient to call back. 

## 2016-03-28 NOTE — Telephone Encounter (Signed)
Spoke with patient. Advised to stop Plavix per Dr. Harrington Challenger. Continue asa 81 mg daily.  Pt verbalizes understanding.

## 2016-04-03 ENCOUNTER — Ambulatory Visit (AMBULATORY_SURGERY_CENTER): Payer: Medicare Other | Admitting: Gastroenterology

## 2016-04-03 ENCOUNTER — Encounter: Payer: Self-pay | Admitting: Gastroenterology

## 2016-04-03 VITALS — BP 136/69 | HR 58 | Temp 98.6°F | Resp 12 | Ht 64.0 in | Wt 198.0 lb

## 2016-04-03 DIAGNOSIS — K2100 Gastro-esophageal reflux disease with esophagitis, without bleeding: Secondary | ICD-10-CM

## 2016-04-03 DIAGNOSIS — R0789 Other chest pain: Secondary | ICD-10-CM | POA: Diagnosis present

## 2016-04-03 DIAGNOSIS — I10 Essential (primary) hypertension: Secondary | ICD-10-CM | POA: Diagnosis not present

## 2016-04-03 DIAGNOSIS — E039 Hypothyroidism, unspecified: Secondary | ICD-10-CM | POA: Diagnosis not present

## 2016-04-03 DIAGNOSIS — I2510 Atherosclerotic heart disease of native coronary artery without angina pectoris: Secondary | ICD-10-CM | POA: Diagnosis not present

## 2016-04-03 DIAGNOSIS — R079 Chest pain, unspecified: Secondary | ICD-10-CM | POA: Diagnosis not present

## 2016-04-03 DIAGNOSIS — K219 Gastro-esophageal reflux disease without esophagitis: Secondary | ICD-10-CM | POA: Diagnosis not present

## 2016-04-03 MED ORDER — SODIUM CHLORIDE 0.9 % IV SOLN: 500.0000 mL | INTRAVENOUS | Status: DC

## 2016-04-03 MED ORDER — PANTOPRAZOLE SODIUM 40 MG PO TBEC: 40.0000 mg | DELAYED_RELEASE_TABLET | Freq: Every day | ORAL | Status: DC

## 2016-04-03 NOTE — Progress Notes (Signed)
Report to PACU, RN, vss, BBS= Clear.  

## 2016-04-03 NOTE — Op Note (Addendum)
[facility] Patient Name: [name] Procedure Date: 04/03/2016 11:27 AM MRN: [id number] Endoscopist: Ladene Artist MD, MD Age: 70 Date of Birth: 08-19-1946 Gender: Female Procedure:                Upper GI endoscopy Indications:              Suspected gastro-esophageal reflux disease, chest                            pain Medicines:                Monitored Anesthesia Care Procedure:                Pre-Anesthesia Assessment:                           - Prior to the procedure, a History and Physical                            was performed, and patient medications and                            allergies were reviewed. The patient's tolerance of                            previous anesthesia was also reviewed. The risks                            and benefits of the procedure and the sedation                            options and risks were discussed with the patient.                            All questions were answered, and informed consent                            was obtained. Prior Anticoagulants: The patient has                            taken Plavix (clopidogrel), last dose was 5 days                            prior to procedure. ASA Grade Assessment: II - A                            patient with mild systemic disease. After reviewing                            the risks and benefits, the patient was deemed in                            satisfactory condition to undergo the procedure.  After obtaining informed consent, the endoscope was                            passed under direct vision. Throughout the                            procedure, the patient's blood pressure, pulse, and                            oxygen saturations were monitored continuously. The                            Model [id number]-[id number] scope was introduced                            through the mouth, and advanced to the second part           of duodenum. The upper GI endoscopy was                            accomplished without difficulty. The patient                            tolerated the procedure well. Scope In: Scope Out: Findings:                 LA Grade B (one or more mucosal breaks greater than                            5 mm, not extending between the tops of two mucosal                            folds) esophagitis with no bleeding was found in                            the distal esophagus.                           The exam of the esophagus was otherwise normal.                           The entire examined stomach was normal.                           The cardia and gastric fundus were normal on                            retroflexion.                           The duodenal bulb and second portion of the                            duodenum were normal. Complications:            No immediate complications. Estimated Blood Loss:  Estimated blood loss: none. Impression:               - LA Grade B reflux esophagitis.                           - Otherwise normal EGD Recommendation:           - Patient has a contact number available for                            emergencies. The signs and symptoms of potential                            delayed complications were discussed with the                            patient. Return to normal activities tomorrow.                            Written discharge instructions were provided to the                            patient.                           - Resume previous diet.                           - Antireflux measures long term                           - Resume Plavix (clopidogrel) at prior dose                            tomorrow. Refer to managing physician for further                            adjustment of therapy.                           - Await pathology results.                           - Protonix (pantoprazole) 40 mg PO daily                             indefinitely. Discontinue famotidine Ladene Artist MD, MD 04/03/2016 11:45:20 AM This report has been signed electronically. Addendum Number: 1   Addendum Date: 04/03/2016 3:51:36 PM      Await pathology is an error. No pathlogy was obtained. Ladene Artist MD, MD 04/03/2016 3:52:21 PM This report has been signed electronically.

## 2016-04-03 NOTE — Patient Instructions (Addendum)
YOU HAD AN ENDOSCOPIC PROCEDURE TODAY AT [facility]:   Refer to the procedure report that was given to you for any specific questions about what was found during the examination.  If the procedure report does not answer your questions, please call your gastroenterologist to clarify.  If you requested that your care partner not be given the details of your procedure findings, then the procedure report has been included in a sealed envelope for you to review at your convenience later.  YOU SHOULD EXPECT: Some feelings of bloating in the abdomen. Passage of more gas than usual.  Walking can help get rid of the air that was put into your GI tract during the procedure and reduce the bloating. If you had a lower endoscopy (such as a colonoscopy or flexible sigmoidoscopy) you may notice spotting of blood in your stool or on the toilet paper. If you underwent a bowel prep for your procedure, you may not have a normal bowel movement for a few days.  Please Note:  You might notice some irritation and congestion in your nose or some drainage.  This is from the oxygen used during your procedure.  There is no need for concern and it should clear up in a day or so.  SYMPTOMS TO REPORT IMMEDIATELY:    Following upper endoscopy (EGD)  Vomiting of blood or coffee ground material  New chest pain or pain under the shoulder blades  Painful or persistently difficult swallowing  New shortness of breath  Fever of 100F or higher  Black, tarry-looking stools  For urgent or emergent issues, a gastroenterologist can be reached at any hour by calling [phone].   DIET: Your first meal following the procedure should be a small meal and then it is ok to progress to your normal diet. Heavy or fried foods are harder to digest and may make you feel nauseous or bloated.  Likewise, meals heavy in dairy and vegetables can increase bloating.  Drink plenty of fluids but you should avoid alcoholic beverages  for 24 hours.  ACTIVITY:  You should plan to take it easy for the rest of today and you should NOT DRIVE or use heavy machinery until tomorrow (because of the sedation medicines used during the test).    FOLLOW UP: Our staff will call the number listed on your records the next business day following your procedure to check on you and address any questions or concerns that you may have regarding the information given to you following your procedure. If we do not reach you, we will leave a message.  However, if you are feeling well and you are not experiencing any problems, there is no need to return our call.  We will assume that you have returned to your regular daily activities without incident.  If any biopsies were taken you will be contacted by phone or by letter within the next 1-3 weeks.  Please call us at [phone] if you have not heard about the biopsies in 3 weeks.    SIGNATURES/CONFIDENTIALITY: You and/or your care partner have signed paperwork which will be entered into your electronic medical record.  These signatures attest to the fact that that the information above on your After Visit Summary has been reviewed and is understood.  Full responsibility of the confidentiality of this discharge information lies with you and/or your care-partner.    Handouts were given to your care partner on GERD and esophagitis. Resume your PLAVIX tomorrow.  Refer  to managing MD for further adjustment of therapy. You may resume your current medications today. Please call if any questions or concerns.

## 2016-04-04 ENCOUNTER — Telehealth: Payer: Self-pay

## 2016-04-04 NOTE — Telephone Encounter (Signed)
Left a message at [phone] for the pt to call us back if any questions or concerns. maw

## 2016-04-05 DIAGNOSIS — Q72811 Congenital shortening of right lower limb: Secondary | ICD-10-CM | POA: Diagnosis not present

## 2016-04-05 DIAGNOSIS — M50322 Other cervical disc degeneration at C5-C6 level: Secondary | ICD-10-CM | POA: Diagnosis not present

## 2016-04-05 DIAGNOSIS — M9905 Segmental and somatic dysfunction of pelvic region: Secondary | ICD-10-CM | POA: Diagnosis not present

## 2016-04-05 DIAGNOSIS — M9901 Segmental and somatic dysfunction of cervical region: Secondary | ICD-10-CM | POA: Diagnosis not present

## 2016-04-05 DIAGNOSIS — M9902 Segmental and somatic dysfunction of thoracic region: Secondary | ICD-10-CM | POA: Diagnosis not present

## 2016-04-12 DIAGNOSIS — M9902 Segmental and somatic dysfunction of thoracic region: Secondary | ICD-10-CM | POA: Diagnosis not present

## 2016-04-12 DIAGNOSIS — M9901 Segmental and somatic dysfunction of cervical region: Secondary | ICD-10-CM | POA: Diagnosis not present

## 2016-04-12 DIAGNOSIS — M9905 Segmental and somatic dysfunction of pelvic region: Secondary | ICD-10-CM | POA: Diagnosis not present

## 2016-04-12 DIAGNOSIS — Q72811 Congenital shortening of right lower limb: Secondary | ICD-10-CM | POA: Diagnosis not present

## 2016-04-12 DIAGNOSIS — M50322 Other cervical disc degeneration at C5-C6 level: Secondary | ICD-10-CM | POA: Diagnosis not present

## 2016-04-19 DIAGNOSIS — M9902 Segmental and somatic dysfunction of thoracic region: Secondary | ICD-10-CM | POA: Diagnosis not present

## 2016-04-19 DIAGNOSIS — M50322 Other cervical disc degeneration at C5-C6 level: Secondary | ICD-10-CM | POA: Diagnosis not present

## 2016-04-19 DIAGNOSIS — Q72811 Congenital shortening of right lower limb: Secondary | ICD-10-CM | POA: Diagnosis not present

## 2016-04-19 DIAGNOSIS — M9901 Segmental and somatic dysfunction of cervical region: Secondary | ICD-10-CM | POA: Diagnosis not present

## 2016-04-19 DIAGNOSIS — M9905 Segmental and somatic dysfunction of pelvic region: Secondary | ICD-10-CM | POA: Diagnosis not present

## 2016-04-20 DIAGNOSIS — M5126 Other intervertebral disc displacement, lumbar region: Secondary | ICD-10-CM | POA: Diagnosis not present

## 2016-04-20 DIAGNOSIS — M542 Cervicalgia: Secondary | ICD-10-CM | POA: Diagnosis not present

## 2016-04-20 DIAGNOSIS — M5416 Radiculopathy, lumbar region: Secondary | ICD-10-CM | POA: Diagnosis not present

## 2016-04-20 DIAGNOSIS — M50321 Other cervical disc degeneration at C4-C5 level: Secondary | ICD-10-CM | POA: Diagnosis not present

## 2016-04-25 ENCOUNTER — Ambulatory Visit: Payer: Medicare Other | Admitting: Gastroenterology

## 2016-05-03 DIAGNOSIS — M9905 Segmental and somatic dysfunction of pelvic region: Secondary | ICD-10-CM | POA: Diagnosis not present

## 2016-05-03 DIAGNOSIS — Q72811 Congenital shortening of right lower limb: Secondary | ICD-10-CM | POA: Diagnosis not present

## 2016-05-03 DIAGNOSIS — M9902 Segmental and somatic dysfunction of thoracic region: Secondary | ICD-10-CM | POA: Diagnosis not present

## 2016-05-03 DIAGNOSIS — M50322 Other cervical disc degeneration at C5-C6 level: Secondary | ICD-10-CM | POA: Diagnosis not present

## 2016-05-03 DIAGNOSIS — M9901 Segmental and somatic dysfunction of cervical region: Secondary | ICD-10-CM | POA: Diagnosis not present

## 2016-05-07 ENCOUNTER — Encounter: Payer: Self-pay | Admitting: Family Medicine

## 2016-05-07 ENCOUNTER — Ambulatory Visit (HOSPITAL_BASED_OUTPATIENT_CLINIC_OR_DEPARTMENT_OTHER)
Admission: RE | Admit: 2016-05-07 | Discharge: 2016-05-07 | Disposition: A | Discharge status: Home / Self Care | Payer: Medicare Other | Source: Ambulatory Visit | Attending: Family Medicine | Admitting: Family Medicine

## 2016-05-07 ENCOUNTER — Ambulatory Visit (INDEPENDENT_AMBULATORY_CARE_PROVIDER_SITE_OTHER): Payer: Medicare Other | Admitting: Family Medicine

## 2016-05-07 VITALS — BP 118/68 | HR 78 | Temp 98.4°F | Ht 64.0 in | Wt 200.4 lb

## 2016-05-07 DIAGNOSIS — Z1239 Encounter for other screening for malignant neoplasm of breast: Secondary | ICD-10-CM | POA: Diagnosis not present

## 2016-05-07 DIAGNOSIS — R1013 Epigastric pain: Secondary | ICD-10-CM | POA: Diagnosis not present

## 2016-05-07 DIAGNOSIS — E039 Hypothyroidism, unspecified: Secondary | ICD-10-CM | POA: Diagnosis not present

## 2016-05-07 DIAGNOSIS — R739 Hyperglycemia, unspecified: Secondary | ICD-10-CM

## 2016-05-07 DIAGNOSIS — Z0000 Encounter for general adult medical examination without abnormal findings: Secondary | ICD-10-CM

## 2016-05-07 DIAGNOSIS — K2100 Gastro-esophageal reflux disease with esophagitis, without bleeding: Secondary | ICD-10-CM

## 2016-05-07 DIAGNOSIS — E785 Hyperlipidemia, unspecified: Secondary | ICD-10-CM

## 2016-05-07 DIAGNOSIS — I10 Essential (primary) hypertension: Secondary | ICD-10-CM

## 2016-05-07 DIAGNOSIS — M542 Cervicalgia: Secondary | ICD-10-CM

## 2016-05-07 DIAGNOSIS — E876 Hypokalemia: Secondary | ICD-10-CM

## 2016-05-07 DIAGNOSIS — E60 Dietary zinc deficiency: Secondary | ICD-10-CM

## 2016-05-07 DIAGNOSIS — E892 Postprocedural hypoparathyroidism: Secondary | ICD-10-CM | POA: Diagnosis not present

## 2016-05-07 DIAGNOSIS — Z1231 Encounter for screening mammogram for malignant neoplasm of breast: Secondary | ICD-10-CM | POA: Diagnosis not present

## 2016-05-07 DIAGNOSIS — Z780 Asymptomatic menopausal state: Secondary | ICD-10-CM | POA: Diagnosis not present

## 2016-05-07 DIAGNOSIS — Z13820 Encounter for screening for osteoporosis: Secondary | ICD-10-CM | POA: Diagnosis not present

## 2016-05-07 LAB — CBC
HEMATOCRIT: 39.4 % (ref 36.0–46.0)
Hemoglobin: 13.3 g/dL (ref 12.0–15.0)
MCHC: 33.9 g/dL (ref 30.0–36.0)
MCV: 84.2 fl (ref 78.0–100.0)
PLATELETS: 260 10*3/uL (ref 150.0–400.0)
RBC: 4.68 Mil/uL (ref 3.87–5.11)
RDW: 15.5 % (ref 11.5–15.5)
WBC: 10 10*3/uL (ref 4.0–10.5)

## 2016-05-07 LAB — COMPREHENSIVE METABOLIC PANEL
ALBUMIN: 4.1 g/dL (ref 3.5–5.2)
ALK PHOS: 71 U/L (ref 39–117)
ALT: 21 U/L (ref 0–35)
AST: 21 U/L (ref 0–37)
BILIRUBIN TOTAL: 0.4 mg/dL (ref 0.2–1.2)
BUN: 30 mg/dL — ABNORMAL HIGH (ref 6–23)
CALCIUM: 8.4 mg/dL (ref 8.4–10.5)
CO2: 31 mEq/L (ref 19–32)
Chloride: 102 mEq/L (ref 96–112)
Creatinine, Ser: 1.17 mg/dL (ref 0.40–1.20)
GFR: 48.72 mL/min — AB (ref >60.00)
Glucose, Bld: 91 mg/dL (ref 70–99)
Potassium: 4.3 mEq/L (ref 3.5–5.1)
Sodium: 141 mEq/L (ref 135–145)
TOTAL PROTEIN: 7.2 g/dL (ref 6.0–8.3)

## 2016-05-07 LAB — HEMOGLOBIN A1C: Hgb A1c MFr Bld: 5.9 % (ref 4.6–6.5)

## 2016-05-07 LAB — VITAMIN D 25 HYDROXY (VIT D DEFICIENCY, FRACTURES): VITD: 60.01 ng/mL (ref 30.00–100.00)

## 2016-05-07 MED ORDER — LEVOTHYROXINE SODIUM 100 MCG PO TABS: 100.0000 ug | ORAL_TABLET | Freq: Every day | ORAL | Status: DC

## 2016-05-07 MED ORDER — ROSUVASTATIN CALCIUM 40 MG PO TABS: 40.0000 mg | ORAL_TABLET | Freq: Every day | ORAL | Status: DC

## 2016-05-07 MED ORDER — PANTOPRAZOLE SODIUM 40 MG PO TBEC: 40.0000 mg | DELAYED_RELEASE_TABLET | Freq: Every day | ORAL | Status: DC

## 2016-05-07 MED ORDER — CALCITRIOL 0.25 MCG PO CAPS: 0.7500 ug | ORAL_CAPSULE | Freq: Every day | ORAL | Status: DC

## 2016-05-07 MED ORDER — ISOSORBIDE MONONITRATE ER 60 MG PO TB24: 90.0000 mg | ORAL_TABLET | Freq: Every day | ORAL | Status: DC

## 2016-05-07 MED ORDER — AMLODIPINE BESYLATE 10 MG PO TABS: 10.0000 mg | ORAL_TABLET | Freq: Every day | ORAL | Status: DC

## 2016-05-07 NOTE — Assessment & Plan Note (Signed)
Tolerating statin, encouraged heart healthy diet, avoid trans fats, minimize simple carbs and saturated fats. Increase exercise as tolerated 

## 2016-05-07 NOTE — Assessment & Plan Note (Signed)
On Levothyroxine, continue to monitor 

## 2016-05-07 NOTE — Progress Notes (Signed)
Pre visit review using our clinic review tool, if applicable. No additional management support is needed unless otherwise documented below in the visit note. 

## 2016-05-07 NOTE — Patient Instructions (Signed)

## 2016-05-07 NOTE — Assessment & Plan Note (Signed)
Well controlled, no changes to meds. Encouraged heart healthy diet such as the DASH diet and exercise as tolerated.  °

## 2016-05-07 NOTE — Progress Notes (Signed)
Subjective:    Patient ID: [name], female    DOB: 1946-06-05, 70 y.o.   MRN: [id number]  Chief Complaint  Patient presents with  . Follow-up    HPI Patient is in today for follow up. She is feeling well but is struggling with chronic pain and managing it with pain management, Dr Nelva Bush. She is also noting flare in heartburn. Denies CP/palp/SOB/HA/congestion/fevers or GU c/o. Taking meds as prescribed  Past Medical History  Diagnosis Date  . GERD (gastroesophageal reflux disease)     occasional  . Hyperlipidemia   . Hypertensive heart disease     a. Echocardiogram 11/19/11: Difficult acoustic windows, EF 60-65%, normal LV wall thickness, grade 1 diastolic dysfunction  . Vertigo   . Tubular adenoma of colon 06/2011  . Colitis   . Multinodular thyroid     Goiter s/p thyroidectomy in 2007 with post-op  hypocalcemia and post-op hypothyroidism/hypoparathyroidism with hypocalcemia  . CAD (coronary artery disease)     a. NSTEMI 10/2011: [facility] 11/19/11: pLAD 30%, oOM 60%, AVCFX 30%, CFX after OM2 30%, pRCA 60 and 70%, then 99%, AM 80-90% with TIMI 3 flow.  PCI: Promus DES x 2 to RCA; b. 06/2012 Cath: patent RCA stents w/ subtl occl of Acute Marginal (jailed)->Med rx; c. 05/2015 MV: EF 59%, mod mid infsept/inf/ap lat/ap infarct with peri-infarct isch-->Med Rx; d. 02/2016 Cath: LM nl, LAD 50m, RI 50, RCA patent stents.  . Post-surgical hypothyroidism   . Hypoparathyroidism ([place])   . Palpitations     a. 03/2012 - patient set up for event monitor but did not wear correctly -  she declined wearing a repeat monitor  . Unspecified constipation 05/31/2013  . Back pain 05/31/2013  . Nocturia 05/31/2013  . Neck pain on right side 07/13/2013  . Sun-damaged skin 12/05/2014  . Zinc deficiency 11/26/2015  . Pain in right axilla 03/13/2016    Past Surgical History  Procedure Laterality Date  . Colonoscopy  Aenomatous polyps    07/05/2011  . Total thyroidectomy  2007    GOITER  . Shoulder arthroscopy  w/ rotator cuff repair      right  . Coronary angioplasty with stent placement    . Colonoscopy N/A 09/28/2014    Procedure: COLONOSCOPY;  Surgeon: Ladene Artist, MD;  Location: [facility];  Service: Endoscopy;  Laterality: N/A;  . Percutaneous coronary stent intervention (pci-s) N/A 11/19/2011    Procedure: PERCUTANEOUS CORONARY STENT INTERVENTION (PCI-S);  Surgeon: Hillary Bow, MD;  Location: [facility] CATH LAB;  Service: Cardiovascular;  Laterality: N/A;  . Left heart catheterization with coronary angiogram N/A 07/17/2012    Procedure: LEFT HEART CATHETERIZATION WITH CORONARY ANGIOGRAM;  Surgeon: Hillary Bow, MD;  Location: [facility] CATH LAB;  Service: Cardiovascular;  Laterality: N/A;  . Cardiac catheterization N/A 03/08/2016    Procedure: Left Heart Cath and Coronary Angiography;  Surgeon: Jettie Booze, MD;  Location: [facility];  Service: Cardiovascular;  Laterality: N/A;    Family History  Problem Relation Age of Onset  . Colon cancer Brother   . Cancer Brother     COLON  . Hypertension Father   . Heart disease Father   . Diabetes Neg Hx   . Prostate cancer Neg Hx   . Breast cancer Neg Hx   . Blindness Sister   . Congestive Heart Failure Sister   . Hypertension Sister   . Thyroid disease Sister   . Cancer Brother     multiple myelomas  .  Heart attack Father     Social History   Social History  . Marital Status: Married    Spouse Name: N/A  . Number of Children: 4  . Years of Education: 14   Occupational History  . Sales person at [employer]  . Smoking status: Never Smoker   . Smokeless tobacco: Never Used  . Alcohol Use: No  . Drug Use: No  . Sexual Activity: Not on file   Other Topics Concern  . Not on file   Social History Narrative   Lives at home alone.  Her son lives near her.   Right-handed.   1 cup coffee per day.    Outpatient Prescriptions Prior to Visit  Medication Sig Dispense Refill  . aspirin EC 81  MG tablet Take 81 mg by mouth daily.      . B Complex-C (B-COMPLEX WITH VITAMIN C) tablet Take 1 tablet by mouth daily.    . calcium carbonate (TUMS - DOSED IN MG ELEMENTAL CALCIUM) 500 MG chewable tablet Chew 4 tablets by mouth 2 (two) times daily.     . cholecalciferol (VITAMIN D) 1000 UNITS tablet Take 1,000 Units by mouth daily.    . diazepam (VALIUM) 5 MG tablet Take 5 mg by mouth at bedtime as needed. (SLEEP)  0  . famotidine (PEPCID) 20 MG tablet Take 1 tablet by mouth two  times daily 180 tablet 1  . gabapentin (NEURONTIN) 100 MG capsule Take 3 capsules (300 mg total) by mouth 3 (three) times daily. 270 capsule 11  . Menthol, Topical Analgesic, (ICY HOT EX) Apply 1 patch topically daily.    . methocarbamol (ROBAXIN) 500 MG tablet Take 2 tablets (1,000 mg total) by mouth every 8 (eight) hours as needed (shoulder pain). 30 tablet 0  . nitroGLYCERIN (NITROSTAT) 0.4 MG SL tablet Dissolve 1 tablet under the tongue every 5 minutes as   needed for chest pain as   directed 25 tablet 2  . amLODipine (NORVASC) 10 MG tablet Take 1 tablet (10 mg total) by mouth daily. 90 tablet 3  . calcitRIOL (ROCALTROL) 0.25 MCG capsule Take 3 capsules (0.75 mcg total) by mouth daily. Take 3 cap daily 270 capsule 1  . isosorbide mononitrate (IMDUR) 60 MG 24 hr tablet Take 90 mg by mouth daily. TAKE 1 1/2 TABS BY MOUTH DAILY.    Marland Kitchen levothyroxine (SYNTHROID, LEVOTHROID) 100 MCG tablet Take 1 tablet (100 mcg total) by mouth daily. 30 tablet 3  . pantoprazole (PROTONIX) 40 MG tablet Take 1 tablet (40 mg total) by mouth daily. 90 tablet 3  . rosuvastatin (CRESTOR) 40 MG tablet Take 1 tablet (40 mg total) by mouth daily. 30 tablet 6   Facility-Administered Medications Prior to Visit  Medication Dose Route Frequency Provider Last Rate Last Dose  . iohexol (OMNIPAQUE) 350 MG/ML injection 100 mL  100 mL Intravenous Once PRN Sherwood Gambler, MD        Allergies  Allergen Reactions  . Zetia [Ezetimibe] Other (See Comments)      Myalgia, paresthesias and weakness    Review of Systems  Constitutional: Negative for fever and malaise/fatigue.  HENT: Negative for congestion.   Eyes: Negative for blurred vision.  Respiratory: Negative for shortness of breath.   Cardiovascular: Negative for chest pain, palpitations and leg swelling.  Gastrointestinal: Positive for heartburn. Negative for nausea, abdominal pain and blood in stool.  Genitourinary: Negative for dysuria and frequency.  Musculoskeletal: Negative for falls.  Skin: Negative for rash.  Neurological: Negative for dizziness, loss of consciousness and headaches.  Endo/Heme/Allergies: Negative for environmental allergies.  Psychiatric/Behavioral: Negative for depression. The patient is not nervous/anxious.        Objective:    Physical Exam  Constitutional: She is oriented to person, place, and time. She appears well-developed and well-nourished. No distress.  HENT:  Head: Normocephalic and atraumatic.  Eyes: Conjunctivae are normal.  Neck: Neck supple. No thyromegaly present.  Cardiovascular: Normal rate, regular rhythm and normal heart sounds.   No murmur heard. Pulmonary/Chest: Effort normal and breath sounds normal. No respiratory distress.  Abdominal: Soft. Bowel sounds are normal. She exhibits no distension and no mass. There is no tenderness.  Musculoskeletal: She exhibits no edema.  Lymphadenopathy:    She has no cervical adenopathy.  Neurological: She is alert and oriented to person, place, and time.  Skin: Skin is warm and dry.  Psychiatric: She has a normal mood and affect. Her behavior is normal.    BP 118/68 mmHg  Pulse 78  Temp(Src) 98.4 F (36.9 C) (Oral)  Ht 5\' 4"  (1.626 m)  Wt 200 lb 6 oz (90.89 kg)  BMI 34.38 kg/m2  SpO2 95% Wt Readings from Last 3 Encounters:  05/07/16 200 lb 6 oz (90.89 kg)  04/03/16 198 lb (89.812 kg)  03/27/16 198 lb (89.812 kg)     Lab Results  Component Value Date   WBC 10.0 05/07/2016   HGB  13.3 05/07/2016   HCT 39.4 05/07/2016   PLT 260.0 05/07/2016   GLUCOSE 91 05/07/2016   CHOL 171 01/23/2016   TRIG 108 01/23/2016   HDL 48 01/23/2016   LDLDIRECT 83.8 08/11/2012   LDLCALC 101 01/23/2016   ALT 21 05/07/2016   AST 21 05/07/2016   NA 141 05/07/2016   K 4.3 05/07/2016   CL 102 05/07/2016   CREATININE 1.17 05/07/2016   BUN 30* 05/07/2016   CO2 31 05/07/2016   TSH 3.79 03/13/2016   INR 0.86 03/07/2016   HGBA1C 5.9 05/07/2016    Lab Results  Component Value Date   TSH 3.79 03/13/2016   Lab Results  Component Value Date   WBC 10.0 05/07/2016   HGB 13.3 05/07/2016   HCT 39.4 05/07/2016   MCV 84.2 05/07/2016   PLT 260.0 05/07/2016   Lab Results  Component Value Date   NA 141 05/07/2016   K 4.3 05/07/2016   CO2 31 05/07/2016   GLUCOSE 91 05/07/2016   BUN 30* 05/07/2016   CREATININE 1.17 05/07/2016   BILITOT 0.4 05/07/2016   ALKPHOS 71 05/07/2016   AST 21 05/07/2016   ALT 21 05/07/2016   PROT 7.2 05/07/2016   ALBUMIN 4.1 05/07/2016   CALCIUM 8.4 05/07/2016   CALCIUM 8.2* 05/07/2016   ANIONGAP 10 03/15/2016   GFR 48.72* 05/07/2016   Lab Results  Component Value Date   CHOL 171 01/23/2016   Lab Results  Component Value Date   HDL 48 01/23/2016   Lab Results  Component Value Date   LDLCALC 101 01/23/2016   Lab Results  Component Value Date   TRIG 108 01/23/2016   Lab Results  Component Value Date   CHOLHDL 3.6 01/23/2016   Lab Results  Component Value Date   HGBA1C 5.9 05/07/2016       Assessment & Plan:   Problem List Items Addressed This Visit    Zinc deficiency   Relevant Medications   rosuvastatin (CRESTOR) 40 MG tablet   amLODipine (NORVASC) 10  MG tablet   Other Relevant Orders   DG Bone Density (Completed)   CBC (Completed)   Hemoglobin A1c (Completed)   Vitamin D (25 hydroxy) (Completed)   Comprehensive metabolic panel (Completed)   PTH, Intact and Calcium (Completed)   Preventative health care    Mgm ordered, bone  density performed and is normal      Relevant Medications   rosuvastatin (CRESTOR) 40 MG tablet   amLODipine (NORVASC) 10 MG tablet   Other Relevant Orders   DG Bone Density (Completed)   CBC (Completed)   Hemoglobin A1c (Completed)   Vitamin D (25 hydroxy) (Completed)   Comprehensive metabolic panel (Completed)   PTH, Intact and Calcium (Completed)   Postsurgical hypoparathyroidism (HCC)   Relevant Medications   rosuvastatin (CRESTOR) 40 MG tablet   amLODipine (NORVASC) 10 MG tablet   calcitRIOL (ROCALTROL) 0.25 MCG capsule   Other Relevant Orders   DG Bone Density (Completed)   CBC (Completed)   Hemoglobin A1c (Completed)   Vitamin D (25 hydroxy) (Completed)   Comprehensive metabolic panel (Completed)   PTH, Intact and Calcium (Completed)   Neck pain on right side    Follows with Dr Nelva Bush of pain management and is doing somewhat better. Consider topical treatments.       Hypothyroidism    On Levothyroxine, continue to monitor      Relevant Medications   rosuvastatin (CRESTOR) 40 MG tablet   levothyroxine (SYNTHROID, LEVOTHROID) 100 MCG tablet   amLODipine (NORVASC) 10 MG tablet   Other Relevant Orders   DG Bone Density (Completed)   CBC (Completed)   Hemoglobin A1c (Completed)   Vitamin D (25 hydroxy) (Completed)   Comprehensive metabolic panel (Completed)   PTH, Intact and Calcium (Completed)   Hypokalemia   Relevant Medications   rosuvastatin (CRESTOR) 40 MG tablet   amLODipine (NORVASC) 10 MG tablet   Other Relevant Orders   DG Bone Density (Completed)   CBC (Completed)   Hemoglobin A1c (Completed)   Vitamin D (25 hydroxy) (Completed)   Comprehensive metabolic panel (Completed)   PTH, Intact and Calcium (Completed)   Hypocalcemia   Relevant Orders   DG Bone Density (Completed)   CBC (Completed)   Hemoglobin A1c (Completed)   Vitamin D (25 hydroxy) (Completed)   Comprehensive metabolic panel (Completed)   PTH, Intact and Calcium (Completed)    Hyperlipidemia    Tolerating statin, encouraged heart healthy diet, avoid trans fats, minimize simple carbs and saturated fats. Increase exercise as tolerated      Relevant Medications   rosuvastatin (CRESTOR) 40 MG tablet   amLODipine (NORVASC) 10 MG tablet   isosorbide mononitrate (IMDUR) 60 MG 24 hr tablet   Other Relevant Orders   PTH, Intact and Calcium (Completed)   Essential hypertension, benign    Well controlled, no changes to meds. Encouraged heart healthy diet such as the DASH diet and exercise as tolerated.       Relevant Medications   rosuvastatin (CRESTOR) 40 MG tablet   amLODipine (NORVASC) 10 MG tablet   isosorbide mononitrate (IMDUR) 60 MG 24 hr tablet   Other Relevant Orders   DG Bone Density (Completed)   CBC (Completed)   Hemoglobin A1c (Completed)   Vitamin D (25 hydroxy) (Completed)   Comprehensive metabolic panel (Completed)   PTH, Intact and Calcium (Completed)   Esophageal reflux    Avoid offending foods, start probiotics. Do not eat large meals in late evening and consider raising head of bed.  Relevant Medications   pantoprazole (PROTONIX) 40 MG tablet   Other Relevant Orders   DG Bone Density (Completed)   CBC (Completed)   Hemoglobin A1c (Completed)   Vitamin D (25 hydroxy) (Completed)   Comprehensive metabolic panel (Completed)   PTH, Intact and Calcium (Completed)    Other Visit Diagnoses    Abdominal pain, epigastric    -  Primary    Relevant Medications    rosuvastatin (CRESTOR) 40 MG tablet    amLODipine (NORVASC) 10 MG tablet    Other Relevant Orders    DG Bone Density (Completed)    CBC (Completed)    Hemoglobin A1c (Completed)    Vitamin D (25 hydroxy) (Completed)    Comprehensive metabolic panel (Completed)    PTH, Intact and Calcium (Completed)    Breast cancer screening        Relevant Orders    MM Digital Screening (Completed)    DG Bone Density (Completed)    CBC (Completed)    Hemoglobin A1c (Completed)     Vitamin D (25 hydroxy) (Completed)    Comprehensive metabolic panel (Completed)    PTH, Intact and Calcium (Completed)    Postmenopausal estrogen deficiency        Relevant Orders    DG Bone Density (Completed)    CBC (Completed)    Hemoglobin A1c (Completed)    Vitamin D (25 hydroxy) (Completed)    Comprehensive metabolic panel (Completed)    PTH, Intact and Calcium (Completed)    Hyperglycemia        Relevant Orders    DG Bone Density (Completed)    CBC (Completed)    Hemoglobin A1c (Completed)    Vitamin D (25 hydroxy) (Completed)    Comprehensive metabolic panel (Completed)    PTH, Intact and Calcium (Completed)       I have changed Ms. Klepacki's isosorbide mononitrate. I am also having her maintain her calcium carbonate, aspirin EC, B-complex with vitamin C, cholecalciferol, (Menthol, Topical Analgesic, (ICY HOT EX)), diazepam, famotidine, gabapentin, nitroGLYCERIN, methocarbamol, rosuvastatin, levothyroxine, amLODipine, calcitRIOL, and pantoprazole.  Meds ordered this encounter  Medications  . rosuvastatin (CRESTOR) 40 MG tablet    Sig: Take 1 tablet (40 mg total) by mouth daily.    Dispense:  90 tablet    Refill:  1  . levothyroxine (SYNTHROID, LEVOTHROID) 100 MCG tablet    Sig: Take 1 tablet (100 mcg total) by mouth daily.    Dispense:  90 tablet    Refill:  1  . amLODipine (NORVASC) 10 MG tablet    Sig: Take 1 tablet (10 mg total) by mouth daily.    Dispense:  90 tablet    Refill:  3  . calcitRIOL (ROCALTROL) 0.25 MCG capsule    Sig: Take 3 capsules (0.75 mcg total) by mouth daily. Take 3 cap daily    Dispense:  270 capsule    Refill:  1  . isosorbide mononitrate (IMDUR) 60 MG 24 hr tablet    Sig: Take 1.5 tablets (90 mg total) by mouth daily. TAKE 1 1/2 TABS BY MOUTH DAILY.    Dispense:  135 tablet    Refill:  1  . pantoprazole (PROTONIX) 40 MG tablet    Sig: Take 1 tablet (40 mg total) by mouth daily.    Dispense:  90 tablet    Refill:  3     Penni Homans, MD

## 2016-05-08 LAB — PTH, INTACT AND CALCIUM
CALCIUM: 8.2 mg/dL — AB (ref 8.4–10.5)
PTH: 5 pg/mL — ABNORMAL LOW (ref 14–64)

## 2016-05-11 ENCOUNTER — Telehealth: Payer: Self-pay | Admitting: Family Medicine

## 2016-05-11 NOTE — Telephone Encounter (Signed)
Pt returning your call for lab results.    CB: [phone]

## 2016-05-11 NOTE — Telephone Encounter (Signed)
Patient has been informed of results. 

## 2016-05-13 NOTE — Assessment & Plan Note (Signed)
Avoid offending foods, start probiotics. Do not eat large meals in late evening and consider raising head of bed.  

## 2016-05-13 NOTE — Assessment & Plan Note (Addendum)
Mgm ordered, bone density performed and is normal

## 2016-05-13 NOTE — Assessment & Plan Note (Signed)
Follows with Dr Nelva Bush of pain management and is doing somewhat better. Consider topical treatments.

## 2016-05-17 DIAGNOSIS — M50321 Other cervical disc degeneration at C4-C5 level: Secondary | ICD-10-CM | POA: Diagnosis not present

## 2016-05-17 DIAGNOSIS — M5126 Other intervertebral disc displacement, lumbar region: Secondary | ICD-10-CM | POA: Diagnosis not present

## 2016-05-17 DIAGNOSIS — M542 Cervicalgia: Secondary | ICD-10-CM | POA: Diagnosis not present

## 2016-05-17 DIAGNOSIS — M50323 Other cervical disc degeneration at C6-C7 level: Secondary | ICD-10-CM | POA: Diagnosis not present

## 2016-05-30 ENCOUNTER — Other Ambulatory Visit: Payer: Self-pay

## 2016-05-30 ENCOUNTER — Other Ambulatory Visit: Payer: Self-pay | Admitting: Family Medicine

## 2016-05-30 NOTE — Telephone Encounter (Signed)
Refill request for levothyroxine CalcitRIOL, AmLODipine - 90 day supply     Pharmacy: [id number] - [place], [address] AT [address]

## 2016-05-30 NOTE — Telephone Encounter (Signed)
(  0 day supplies of these three medications called in to pharmacy on 05/04/16. All have refills. Too early to refill. Patient notified.

## 2016-06-13 ENCOUNTER — Encounter (HOSPITAL_COMMUNITY): Payer: Self-pay | Admitting: Emergency Medicine

## 2016-06-13 ENCOUNTER — Emergency Department (HOSPITAL_COMMUNITY): Payer: Medicare Other

## 2016-06-13 ENCOUNTER — Other Ambulatory Visit: Payer: Self-pay

## 2016-06-13 ENCOUNTER — Emergency Department (HOSPITAL_COMMUNITY)
Admission: EM | Admit: 2016-06-13 | Discharge: 2016-06-13 | Disposition: A | Discharge status: Home / Self Care | Payer: Medicare Other | Attending: Dermatology | Admitting: Dermatology

## 2016-06-13 DIAGNOSIS — R079 Chest pain, unspecified: Secondary | ICD-10-CM | POA: Insufficient documentation

## 2016-06-13 DIAGNOSIS — Z5321 Procedure and treatment not carried out due to patient leaving prior to being seen by health care provider: Secondary | ICD-10-CM | POA: Diagnosis not present

## 2016-06-13 LAB — BASIC METABOLIC PANEL
ANION GAP: 9 (ref 5–15)
BUN: 29 mg/dL — ABNORMAL HIGH (ref 6–20)
CO2: 28 mmol/L (ref 22–32)
Calcium: 8.8 mg/dL — ABNORMAL LOW (ref 8.9–10.3)
Chloride: 103 mmol/L (ref 101–111)
Creatinine, Ser: 1.51 mg/dL — ABNORMAL HIGH (ref 0.44–1.00)
GFR calc Af Amer: 40 mL/min — ABNORMAL LOW (ref >60)
GFR, EST NON AFRICAN AMERICAN: 34 mL/min — AB (ref >60)
GLUCOSE: 106 mg/dL — AB (ref 65–99)
POTASSIUM: 4.2 mmol/L (ref 3.5–5.1)
Sodium: 140 mmol/L (ref 135–145)

## 2016-06-13 LAB — CBC
HEMATOCRIT: 39.2 % (ref 36.0–46.0)
HEMOGLOBIN: 12.9 g/dL (ref 12.0–15.0)
MCH: 27.9 pg (ref 26.0–34.0)
MCHC: 32.9 g/dL (ref 30.0–36.0)
MCV: 84.7 fL (ref 78.0–100.0)
Platelets: 236 10*3/uL (ref 150–400)
RBC: 4.63 MIL/uL (ref 3.87–5.11)
RDW: 14.1 % (ref 11.5–15.5)
WBC: 10 10*3/uL (ref 4.0–10.5)

## 2016-06-13 LAB — I-STAT TROPONIN, ED: Troponin i, poc: 0 ng/mL (ref 0.00–0.08)

## 2016-06-13 NOTE — ED Notes (Signed)
Pt states she hasn't felt well all day and hasn't eaten anything has just been at her house. Pt states about 2 hours she started having pressure in the center of her chest that goes into left shoulder. Pt is belching in triage and states she has been doing that since.

## 2016-06-28 ENCOUNTER — Emergency Department (HOSPITAL_COMMUNITY)
Admission: EM | Admit: 2016-06-28 | Discharge: 2016-06-28 | Disposition: A | Discharge status: Home / Self Care | Payer: Medicare Other | Attending: Emergency Medicine | Admitting: Emergency Medicine

## 2016-06-28 ENCOUNTER — Encounter (HOSPITAL_COMMUNITY): Payer: Self-pay | Admitting: Emergency Medicine

## 2016-06-28 DIAGNOSIS — Z79899 Other long term (current) drug therapy: Secondary | ICD-10-CM | POA: Insufficient documentation

## 2016-06-28 DIAGNOSIS — Z955 Presence of coronary angioplasty implant and graft: Secondary | ICD-10-CM | POA: Diagnosis not present

## 2016-06-28 DIAGNOSIS — I119 Hypertensive heart disease without heart failure: Secondary | ICD-10-CM | POA: Insufficient documentation

## 2016-06-28 DIAGNOSIS — Z7982 Long term (current) use of aspirin: Secondary | ICD-10-CM | POA: Insufficient documentation

## 2016-06-28 DIAGNOSIS — R404 Transient alteration of awareness: Secondary | ICD-10-CM | POA: Diagnosis not present

## 2016-06-28 DIAGNOSIS — I2510 Atherosclerotic heart disease of native coronary artery without angina pectoris: Secondary | ICD-10-CM | POA: Diagnosis not present

## 2016-06-28 DIAGNOSIS — H8112 Benign paroxysmal vertigo, left ear: Secondary | ICD-10-CM

## 2016-06-28 DIAGNOSIS — R42 Dizziness and giddiness: Secondary | ICD-10-CM | POA: Diagnosis not present

## 2016-06-28 LAB — CBC WITH DIFFERENTIAL/PLATELET
Basophils Absolute: 0 10*3/uL (ref 0.0–0.1)
Basophils Relative: 1 %
Eosinophils Absolute: 0.1 10*3/uL (ref 0.0–0.7)
Eosinophils Relative: 2 %
HCT: 42.6 % (ref 36.0–46.0)
Hemoglobin: 13.9 g/dL (ref 12.0–15.0)
Lymphocytes Relative: 20 %
Lymphs Abs: 1.7 10*3/uL (ref 0.7–4.0)
MCH: 28.2 pg (ref 26.0–34.0)
MCHC: 32.6 g/dL (ref 30.0–36.0)
MCV: 86.4 fL (ref 78.0–100.0)
Monocytes Absolute: 0.5 10*3/uL (ref 0.1–1.0)
Monocytes Relative: 5 %
Neutro Abs: 6.1 10*3/uL (ref 1.7–7.7)
Neutrophils Relative %: 72 %
Platelets: 269 10*3/uL (ref 150–400)
RBC: 4.93 MIL/uL (ref 3.87–5.11)
RDW: 14.5 % (ref 11.5–15.5)
WBC: 8.4 10*3/uL (ref 4.0–10.5)

## 2016-06-28 LAB — BASIC METABOLIC PANEL
ANION GAP: 9 (ref 5–15)
BUN: 20 mg/dL (ref 6–20)
CHLORIDE: 106 mmol/L (ref 101–111)
CO2: 26 mmol/L (ref 22–32)
Calcium: 8.8 mg/dL — ABNORMAL LOW (ref 8.9–10.3)
Creatinine, Ser: 1.17 mg/dL — ABNORMAL HIGH (ref 0.44–1.00)
GFR, EST AFRICAN AMERICAN: 54 mL/min — AB (ref >60)
GFR, EST NON AFRICAN AMERICAN: 46 mL/min — AB (ref >60)
Glucose, Bld: 111 mg/dL — ABNORMAL HIGH (ref 65–99)
POTASSIUM: 4 mmol/L (ref 3.5–5.1)
SODIUM: 141 mmol/L (ref 135–145)

## 2016-06-28 LAB — I-STAT TROPONIN, ED: TROPONIN I, POC: 0 ng/mL (ref 0.00–0.08)

## 2016-06-28 MED ORDER — LORAZEPAM 2 MG/ML IJ SOLN
0.5000 mg | Freq: Once | INTRAMUSCULAR | Status: AC
  Administered 2016-06-28: 0.5 mg via INTRAVENOUS
  Filled 2016-06-28: qty 1

## 2016-06-28 MED ORDER — MECLIZINE HCL 25 MG PO TABS: 25.0000 mg | ORAL_TABLET | Freq: Three times a day (TID) | ORAL | Status: DC | PRN

## 2016-06-28 NOTE — Discharge Instructions (Signed)
Please performed the Epley maneuver, you can use the paperwork you were given as an example, you can also look at videos on YouTube.  If you continue to have symptoms please make an appointment with the ear nose and throat doctor for further evaluation.  If your symptoms become worse or you are unable to walk or persistently vomiting please return to the emergency department.   Benign Positional Vertigo Vertigo is the feeling that you or your surroundings are moving when they are not. Benign positional vertigo is the most common form of vertigo. The cause of this condition is not serious (is benign). This condition is triggered by certain movements and positions (is positional). This condition can be dangerous if it occurs while you are doing something that could endanger you or others, such as driving.  CAUSES In many cases, the cause of this condition is not known. It may be caused by a disturbance in an area of the inner ear that helps your brain to sense movement and balance. This disturbance can be caused by a viral infection (labyrinthitis), head injury, or repetitive motion. RISK FACTORS This condition is more likely to develop in: 1. Women. 2. People who are 69 years of age or older. SYMPTOMS Symptoms of this condition usually happen when you move your head or your eyes in different directions. Symptoms may start suddenly, and they usually last for less than a minute. Symptoms may include:  Loss of balance and falling.  Feeling like you are spinning or moving.  Feeling like your surroundings are spinning or moving.  Nausea and vomiting.  Blurred vision.  Dizziness.  Involuntary eye movement (nystagmus). Symptoms can be mild and cause only slight annoyance, or they can be severe and interfere with daily life. Episodes of benign positional vertigo may return (recur) over time, and they may be triggered by certain movements. Symptoms may improve over time. DIAGNOSIS This  condition is usually diagnosed by medical history and a physical exam of the head, neck, and ears. You may be referred to a health care provider who specializes in ear, nose, and throat (ENT) problems (otolaryngologist) or a provider who specializes in disorders of the nervous system (neurologist). You may have additional testing, including:  MRI.  A CT scan.  Eye movement tests. Your health care provider may ask you to change positions quickly while he or she watches you for symptoms of benign positional vertigo, such as nystagmus. Eye movement may be tested with an electronystagmogram (ENG), caloric stimulation, the Dix-Hallpike test, or the roll test.  An electroencephalogram (EEG). This records electrical activity in your brain.  Hearing tests. TREATMENT Usually, your health care provider will treat this by moving your head in specific positions to adjust your inner ear back to normal. Surgery may be needed in severe cases, but this is rare. In some cases, benign positional vertigo may resolve on its own in 2-4 weeks. HOME CARE INSTRUCTIONS Safety  Move slowly.Avoid sudden body or head movements.  Avoid driving.  Avoid operating heavy machinery.  Avoid doing any tasks that would be dangerous to you or others if a vertigo episode would occur.  If you have trouble walking or keeping your balance, try using a cane for stability. If you feel dizzy or unstable, sit down right away.  Return to your normal activities as told by your health care provider. Ask your health care provider what activities are safe for you. General Instructions  Take over-the-counter and prescription medicines only as told by  your health care provider.  Avoid certain positions or movements as told by your health care provider.  Drink enough fluid to keep your urine clear or pale yellow.  Keep all follow-up visits as told by your health care provider. This is important. SEEK MEDICAL CARE IF:  You have a  fever.  Your condition gets worse or you develop new symptoms.  Your family or friends notice any behavioral changes.  Your nausea or vomiting gets worse.  You have numbness or a "pins and needles" sensation. SEEK IMMEDIATE MEDICAL CARE IF:  You have difficulty speaking or moving.  You are always dizzy.  You faint.  You develop severe headaches.  You have weakness in your legs or arms.  You have changes in your hearing or vision.  You develop a stiff neck.  You develop sensitivity to light.   This information is not intended to replace advice given to you by your health care provider. Make sure you discuss any questions you have with your health care provider.   Document Released: 09/17/2006 Document Revised: 08/31/2015 Document Reviewed: 04/04/2015 Elsevier Interactive Patient Education 2016 [employer]  Teaching laboratory technicianpley Maneuver Self-Care WHAT IS THE EPLEY MANEUVER? The Epley maneuver is an exercise you can do to relieve symptoms of benign paroxysmal positional vertigo (BPPV). This condition is often just referred to as vertigo. BPPV is caused by the movement of tiny crystals (canaliths) inside your inner ear. The accumulation and movement of canaliths in your inner ear causes a sudden spinning sensation (vertigo) when you move your head to certain positions. Vertigo usually lasts about 30 seconds. BPPV usually occurs in just one ear. If you get vertigo when you lie on your left side, you probably have BPPV in your left ear. Your health care provider can tell you which ear is involved.  BPPV may be caused by a head injury. Many people older than 50 get BPPV for unknown reasons. If you have been diagnosed with BPPV, your health care provider may teach you how to do this maneuver. BPPV is not life threatening (benign) and usually goes away in time.  WHEN SHOULD I PERFORM THE EPLEY MANEUVER? You can do this maneuver at home whenever you have symptoms of vertigo. You may do the Epley  maneuver up to 3 times a day until your symptoms of vertigo go away. HOW SHOULD I DO THE EPLEY MANEUVER? 3. Sit on the edge of a bed or table with your back straight. Your legs should be extended or hanging over the edge of the bed or table.  4. Turn your head halfway toward the affected ear.  5. Lie backward quickly with your head turned until you are lying flat on your back. You may want to position a pillow under your shoulders.  6. Hold this position for 30 seconds. You may experience an attack of vertigo. This is normal. Hold this position until the vertigo stops. 7. Then turn your head to the opposite direction until your unaffected ear is facing the floor.  8. Hold this position for 30 seconds. You may experience an attack of vertigo. This is normal. Hold this position until the vertigo stops. 9. Now turn your whole body to the same side as your head. Hold for another 30 seconds.  10. You can then sit back up. ARE THERE RISKS TO THIS MANEUVER? In some cases, you may have other symptoms (such as changes in your vision, weakness, or numbness). If you have these symptoms, stop doing the maneuver  and call your health care provider. Even if doing these maneuvers relieves your vertigo, you may still have dizziness. Dizziness is the sensation of light-headedness but without the sensation of movement. Even though the Epley maneuver may relieve your vertigo, it is possible that your symptoms will return within 5 years. WHAT SHOULD I DO AFTER THIS MANEUVER? After doing the Epley maneuver, you can return to your normal activities. Ask your doctor if there is anything you should do at home to prevent vertigo. This may include:  Sleeping with two or more pillows to keep your head elevated.  Not sleeping on the side of your affected ear.  Getting up slowly from bed.  Avoiding sudden movements during the day.  Avoiding extreme head movement, like looking up or bending over.  Wearing a cervical  collar to prevent sudden head movements. WHAT SHOULD I DO IF MY SYMPTOMS GET WORSE? Call your health care provider if your vertigo gets worse. Call your provider right way if you have other symptoms, including:   Nausea.  Vomiting.  Headache.  Weakness.  Numbness.  Vision changes.   This information is not intended to replace advice given to you by your health care provider. Make sure you discuss any questions you have with your health care provider.   Document Released: 12/15/2013 Document Reviewed: 12/15/2013 Elsevier Interactive Patient Education [employer].

## 2016-06-28 NOTE — ED Provider Notes (Signed)
CSN: [id number]     Arrival date & time 06/28/16  1006 History   First MD Initiated Contact with Patient 06/28/16 1033     Chief Complaint  Patient presents with  . Dizziness     (Consider location/radiation/quality/duration/timing/severity/associated sxs/prior Treatment) HPI  Blood pressure 142/68, pulse 64, temperature 97.9 F (36.6 C), temperature source Oral, resp. rate 18, SpO2 95 %.  [name] is a 70 y.o. female complaining of  dizziness since this morning.  She states she first noticed "light-headedness" and "room-spinning" before she arose from bed to use the bathroom at 4am this morning, and her symptoms continued to persist throughout the morning.  She notes movement exacerbates her dizziness, but also experiences it at rest.  She also complains of nausea and blurred vision.  She denies headache, diplopia, hearing loss, tinnitus, vomiting, or weakness.  She states she has experienced more mild bouts of dizziness in the past but has never taken medication for it.  She denies change in medication, recent illness, or recent travel.     Past Medical History  Diagnosis Date  . GERD (gastroesophageal reflux disease)     occasional  . Hyperlipidemia   . Hypertensive heart disease     a. Echocardiogram 11/19/11: Difficult acoustic windows, EF 60-65%, normal LV wall thickness, grade 1 diastolic dysfunction  . Vertigo   . Tubular adenoma of colon 06/2011  . Colitis   . Multinodular thyroid     Goiter s/p thyroidectomy in 2007 with post-op  hypocalcemia and post-op hypothyroidism/hypoparathyroidism with hypocalcemia  . CAD (coronary artery disease)     a. NSTEMI 10/2011: [place] 11/19/11: pLAD 30%, oOM 60%, AVCFX 30%, CFX after OM2 30%, pRCA 60 and 70%, then 99%, AM 80-90% with TIMI 3 flow.  PCI: Promus DES x 2 to RCA; b. 06/2012 Cath: patent RCA stents w/ subtl occl of Acute Marginal (jailed)->Med rx; c. 05/2015 MV: EF 59%, mod mid infsept/inf/ap lat/ap infarct with peri-infarct  isch-->Med Rx; d. 02/2016 Cath: LM nl, LAD 46m, RI 50, RCA patent stents.  . Post-surgical hypothyroidism   . Hypoparathyroidism ([place])   . Palpitations     a. 03/2012 - patient set up for event monitor but did not wear correctly -  she declined wearing a repeat monitor  . Unspecified constipation 05/31/2013  . Back pain 05/31/2013  . Nocturia 05/31/2013  . Neck pain on right side 07/13/2013  . Sun-damaged skin 12/05/2014  . Zinc deficiency 11/26/2015  . Pain in right axilla 03/13/2016   Past Surgical History  Procedure Laterality Date  . Colonoscopy  Aenomatous polyps    07/05/2011  . Total thyroidectomy  2007    GOITER  . Shoulder arthroscopy w/ rotator cuff repair      right  . Coronary angioplasty with stent placement    . Colonoscopy N/A 09/28/2014    Procedure: COLONOSCOPY;  Surgeon: Ladene Artist, MD;  Location: [facility] ENDOSCOPY;  Service: Endoscopy;  Laterality: N/A;  . Percutaneous coronary stent intervention (pci-s) N/A 11/19/2011    Procedure: PERCUTANEOUS CORONARY STENT INTERVENTION (PCI-S);  Surgeon: Hillary Bow, MD;  Location: [facility] CATH LAB;  Service: Cardiovascular;  Laterality: N/A;  . Left heart catheterization with coronary angiogram N/A 07/17/2012    Procedure: LEFT HEART CATHETERIZATION WITH CORONARY ANGIOGRAM;  Surgeon: Hillary Bow, MD;  Location: [facility] CATH LAB;  Service: Cardiovascular;  Laterality: N/A;  . Cardiac catheterization N/A 03/08/2016    Procedure: Left Heart Cath and Coronary Angiography;  Surgeon: Jettie Booze,  MD;  Location: [facility] CV LAB;  Service: Cardiovascular;  Laterality: N/A;   Family History  Problem Relation Age of Onset  . Colon cancer Brother   . Cancer Brother     COLON  . Hypertension Father   . Heart disease Father   . Diabetes Neg Hx   . Prostate cancer Neg Hx   . Breast cancer Neg Hx   . Blindness Sister   . Congestive Heart Failure Sister   . Hypertension Sister   . Thyroid disease Sister   . Cancer Brother      multiple myelomas  . Heart attack Father    Social History  Substance Use Topics  . Smoking status: Never Smoker   . Smokeless tobacco: Never Used  . Alcohol Use: No   OB History    Gravida Para Term Preterm AB TAB SAB Ectopic Multiple Living   4 4 4       4      Review of Systems  10 systems reviewed and found to be negative, except as noted in the HPI.  Allergies  Zetia  Home Medications   Prior to Admission medications   Medication Sig Start Date End Date Taking? Authorizing Provider  amLODipine (NORVASC) 10 MG tablet Take 1 tablet (10 mg total) by mouth daily. 05/07/16   Mosie Lukes, MD  aspirin EC 81 MG tablet Take 81 mg by mouth daily.   11/22/11   Dayna N Dunn, PA-C  B Complex-C (B-COMPLEX WITH VITAMIN C) tablet Take 1 tablet by mouth daily.    Historical Provider, MD  calcitRIOL (ROCALTROL) 0.25 MCG capsule Take 3 capsules (0.75 mcg total) by mouth daily. Take 3 cap daily 05/07/16   Mosie Lukes, MD  calcium carbonate (TUMS - DOSED IN MG ELEMENTAL CALCIUM) 500 MG chewable tablet Chew 4 tablets by mouth 2 (two) times daily.     Historical Provider, MD  cholecalciferol (VITAMIN D) 1000 UNITS tablet Take 1,000 Units by mouth daily.    Historical Provider, MD  diazepam (VALIUM) 5 MG tablet Take 5 mg by mouth at bedtime as needed. (SLEEP) 07/01/15   Historical Provider, MD  famotidine (PEPCID) 20 MG tablet Take 1 tablet by mouth two  times daily 09/12/15   Mosie Lukes, MD  gabapentin (NEURONTIN) 100 MG capsule Take 3 capsules (300 mg total) by mouth 3 (three) times daily. 02/21/16   Marcial Pacas, MD  isosorbide mononitrate (IMDUR) 60 MG 24 hr tablet Take 1.5 tablets (90 mg total) by mouth daily. TAKE 1 1/2 TABS BY MOUTH DAILY. 05/07/16   Mosie Lukes, MD  levothyroxine (SYNTHROID, LEVOTHROID) 100 MCG tablet Take 1 tablet (100 mcg total) by mouth daily. 05/07/16   Mosie Lukes, MD  meclizine (ANTIVERT) 25 MG tablet Take 1 tablet (25 mg total) by mouth 3 (three) times daily as  needed for dizziness. 06/28/16   Mayur Duman, PA-C  Menthol, Topical Analgesic, (ICY HOT EX) Apply 1 patch topically daily.    Historical Provider, MD  methocarbamol (ROBAXIN) 500 MG tablet Take 2 tablets (1,000 mg total) by mouth every 8 (eight) hours as needed (shoulder pain). 03/15/16   Julianne Rice, MD  nitroGLYCERIN (NITROSTAT) 0.4 MG SL tablet Dissolve 1 tablet under the tongue every 5 minutes as   needed for chest pain as   directed 03/13/16   Mosie Lukes, MD  pantoprazole (PROTONIX) 40 MG tablet Take 1 tablet (40 mg total) by mouth daily. 05/07/16   Bonnita Levan  Charlett Blake, MD  rosuvastatin (CRESTOR) 40 MG tablet Take 1 tablet (40 mg total) by mouth daily. 05/07/16   Mosie Lukes, MD   BP 134/64 mmHg  Pulse 58  Temp(Src) 97.9 F (36.6 C) (Oral)  Resp 15  SpO2 94% Physical Exam  Constitutional: She is oriented to person, place, and time. She appears well-developed and well-nourished.  HENT:  Head: Normocephalic and atraumatic.  Mouth/Throat: Oropharynx is clear and moist.  Bilateral tympanic membranes with normal architecture and good light reflex, no lesions  Eyes: Conjunctivae and EOM are normal. Pupils are equal, round, and reactive to light.  No TTP of maxillary or frontal sinuses  No TTP or induration of temporal arteries bilaterally  Neck: Normal range of motion. Neck supple.  FROM to C-spine. Pt can touch chin to chest without discomfort. No TTP of midline cervical spine.   Cardiovascular: Normal rate, regular rhythm and intact distal pulses.   Pulmonary/Chest: Effort normal and breath sounds normal. No respiratory distress. She has no wheezes. She has no rales. She exhibits no tenderness.  Abdominal: Soft. Bowel sounds are normal. There is no tenderness.  Musculoskeletal: Normal range of motion. She exhibits no edema or tenderness.  Neurological: She is alert and oriented to person, place, and time. No cranial nerve deficit.  II-Visual fields grossly  intact. III/IV/VI-Extraocular movements intact.  Pupils reactive bilaterally. V/VII-Smile symmetric, equal eyebrow raise,  facial sensation intact VIII- Hearing grossly intact IX/X-Normal gag XI-bilateral shoulder shrug XII-midline tongue extension Motor: 5/5 bilaterally with normal tone and bulk Cerebellar: Normal finger-to-nose  and normal heel-to-shin test.   Romberg negative Ambulates with a coordinated gait, slowly and avoids head movement.  Dix-Hallpike is positive on the left side with rotational nystagmus   Nursing note and vitals reviewed.   ED Course  Procedures (including critical care time) Labs Review Labs Reviewed  BASIC METABOLIC PANEL - Abnormal; Notable for the following:    Glucose, Bld 111 (*)    Creatinine, Ser 1.17 (*)    Calcium 8.8 (*)    GFR calc non Af Amer 46 (*)    GFR calc Af Amer 54 (*)    All other components within normal limits  CBC WITH DIFFERENTIAL/PLATELET  I-STAT TROPOININ, ED    Imaging Review No results found. I have personally reviewed and evaluated these images and lab results as part of my medical decision-making.   EKG Interpretation   Date/Time:  Thursday June 28 2016 10:21:16 EDT Ventricular Rate:  64 PR Interval:    QRS Duration: 94 QT Interval:  447 QTC Calculation: 462 R Axis:   21 Text Interpretation:  Sinus rhythm Non-specific ST-t changes No  significant change since last tracing Confirmed by Christy Gentles  MD, Elenore Rota  [phone]) on 06/28/2016 10:34:02 AM Also confirmed by Christy Gentles  MD, DONALD  [phone]), editor [address], [place] ([id number])  on 06/28/2016 11:13:00 AM      MDM   Final diagnoses:  BPPV (benign paroxysmal positional vertigo), left   Filed Vitals:   06/28/16 1215 06/28/16 1230 06/28/16 1245 06/28/16 1300  BP: 123/60 127/61 130/65 134/64  Pulse: 64 58 59 58  Temp:      TempSrc:      Resp: 17 15 15 15   SpO2: 91% 92% 91% 94%    Medications  LORazepam (ATIVAN) injection 0.5 mg (0.5 mg Intravenous Given  06/28/16 1118)    [name] is 70 y.o. female presenting with Acute onset of vertigo this a.m. Neurologic exam is nonfocal. Dix-Hallpike positive,  clear exacerbation with head movement. This is very likely BPPV. Basic cardiac workup is negative. Shared visit with the attending physician who is personally evaluated the patient and agrees that she is stable for discharge home, patient is ambulatory and feels improved after Ativan, will write for prescription of meclizine, I printed out some information on how to perform the Epley maneuver at home, she's given ENT referral for persistent symptoms.  Evaluation does not show pathology that would require ongoing emergent intervention or inpatient treatment. Pt is hemodynamically stable and mentating appropriately. Discussed findings and plan with patient/guardian, who agrees with care plan. All questions answered. Return precautions discussed and outpatient follow up given.   Discharge Medication List as of 06/28/2016 12:52 PM    START taking these medications   Details  meclizine (ANTIVERT) 25 MG tablet Take 1 tablet (25 mg total) by mouth 3 (three) times daily as needed for dizziness., Starting 06/28/2016, Until Discontinued, [employer], PA-C 06/28/16 [place], MD 06/29/16 [phone]

## 2016-06-28 NOTE — ED Notes (Signed)
Pt here with c/o dizziness and nausea , pt states that it started around 4 am when she got up to go to bathroom , pt received 4 mg of zofran

## 2016-07-03 ENCOUNTER — Telehealth: Payer: Self-pay | Admitting: Family Medicine

## 2016-07-03 NOTE — Telephone Encounter (Signed)
OK to try and put 2 spots together some time next week.

## 2016-07-03 NOTE — Telephone Encounter (Signed)
°  Relationship to patient: Self  Can be reached: [phone]    Reason for call: Patient was D/c'd from the hospital Thursday 7/6 and was told to have a follow up within 1 week. Patient request to see PCP, declined seeing PA. No available Hospital Follow-up appts with PCP. Plse adv

## 2016-07-05 NOTE — Telephone Encounter (Signed)
Called patient to schedule appt as below but found no time available for appt next week. Plse adv

## 2016-07-05 NOTE — Telephone Encounter (Signed)
Use 4 oclock on thursday

## 2016-07-23 ENCOUNTER — Ambulatory Visit (INDEPENDENT_AMBULATORY_CARE_PROVIDER_SITE_OTHER): Payer: Medicare Other | Admitting: Family

## 2016-07-23 ENCOUNTER — Encounter: Payer: Self-pay | Admitting: Family

## 2016-07-23 VITALS — BP 126/72 | HR 64 | Temp 97.5°F | Resp 96 | Ht 64.0 in | Wt 197.4 lb

## 2016-07-23 DIAGNOSIS — N764 Abscess of vulva: Secondary | ICD-10-CM

## 2016-07-23 MED ORDER — DOXYCYCLINE HYCLATE 100 MG PO TABS: 100.0000 mg | ORAL_TABLET | Freq: Two times a day (BID) | ORAL | 0 refills | Status: DC

## 2016-07-23 NOTE — Progress Notes (Signed)
Subjective:    Patient ID: [name], female    DOB: [birth year], 70 y.o.   MRN: [id number]  HPI   [name] is a 70 yr old female who presents today with chief complaint of vaginal irritation which began last night.  Reports that she noticed blood in her underwear last night ("only a few drops"). Will be travelling in the next 2 weeks. Denies vaginal discharge. Notes that she had some mild LLQ pain 2 days ago but it resolved. Denies dysuria/fequency or fever.    Review of Systems See HPI  Past Medical History:  Diagnosis Date  . Back pain 05/31/2013  . CAD (coronary artery disease)    a. NSTEMI 10/2011: [place] 11/19/11: pLAD 30%, oOM 60%, AVCFX 30%, CFX after OM2 30%, pRCA 60 and 70%, then 99%, AM 80-90% with TIMI 3 flow.  PCI: Promus DES x 2 to RCA; b. 06/2012 Cath: patent RCA stents w/ subtl occl of Acute Marginal (jailed)->Med rx; c. 05/2015 MV: EF 59%, mod mid infsept/inf/ap lat/ap infarct with peri-infarct isch-->Med Rx; d. 02/2016 Cath: LM nl, LAD 59m, RI 50, RCA patent stents.  . Colitis   . GERD (gastroesophageal reflux disease)    occasional  . Hyperlipidemia   . Hypertensive heart disease    a. Echocardiogram 11/19/11: Difficult acoustic windows, EF 60-65%, normal LV wall thickness, grade 1 diastolic dysfunction  . Hypoparathyroidism ([place])   . Multinodular thyroid    Goiter s/p thyroidectomy in 2007 with post-op  hypocalcemia and post-op hypothyroidism/hypoparathyroidism with hypocalcemia  . Neck pain on right side 07/13/2013  . Nocturia 05/31/2013  . Pain in right axilla 03/13/2016  . Palpitations    a. 03/2012 - patient set up for event monitor but did not wear correctly -  she declined wearing a repeat monitor  . Post-surgical hypothyroidism   . Sun-damaged skin 12/05/2014  . Tubular adenoma of colon 06/2011  . Unspecified constipation 05/31/2013  . Vertigo   . Zinc deficiency 11/26/2015     Social History   Social History  . Marital status: Married    Spouse name:  N/A  . Number of children: 4  . Years of education: 14   Occupational History  . 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 Smoking status: Never Smoker  . Smokeless tobacco: Never Used  . Alcohol use No  . Drug use: No  . Sexual activity: Not on file   Other Topics Concern  . Not on file   Social History Narrative   Lives at home alone.  Her son lives near her.   Right-handed.   1 cup coffee per day.    Past Surgical History:  Procedure Laterality Date  . CARDIAC CATHETERIZATION N/A 03/08/2016   Procedure: Left Heart Cath and Coronary Angiography;  Surgeon: Jettie Booze, MD;  Location: [place] [facility];  Service: Cardiovascular;  Laterality: N/A;  . COLONOSCOPY  Aenomatous polyps   07/05/2011  . COLONOSCOPY N/A 09/28/2014   Procedure: COLONOSCOPY;  Surgeon: Ladene Artist, MD;  Location: [facility];  Service: Endoscopy;  Laterality: N/A;  . CORONARY ANGIOPLASTY WITH STENT PLACEMENT    . LEFT HEART CATHETERIZATION WITH CORONARY ANGIOGRAM N/A 07/17/2012   Procedure: LEFT HEART CATHETERIZATION WITH CORONARY ANGIOGRAM;  Surgeon: Hillary Bow, MD;  Location: [facility] CATH LAB;  Service: Cardiovascular;  Laterality: N/A;  . PERCUTANEOUS CORONARY STENT INTERVENTION (PCI-S) N/A 11/19/2011   Procedure: PERCUTANEOUS CORONARY STENT INTERVENTION (PCI-S);  Surgeon: Marcello Moores  Baird Kay, MD;  Location: [place] CATH LAB;  Service: Cardiovascular;  Laterality: N/A;  . SHOULDER ARTHROSCOPY W/ ROTATOR CUFF REPAIR     right  . TOTAL THYROIDECTOMY  2007   GOITER    Family History  Problem Relation Age of Onset  . Colon cancer Brother   . Cancer Brother     COLON  . Hypertension Father   . Heart disease Father   . Heart attack Father   . Blindness Sister   . Congestive Heart Failure Sister   . Hypertension Sister   . Thyroid disease Sister   . Cancer Brother     multiple myelomas  . Diabetes Neg Hx   . Prostate cancer Neg Hx   . Breast cancer Neg Hx     Allergies    Allergen Reactions  . Zetia [Ezetimibe] Other (See Comments)    Myalgia, paresthesias and weakness    Current Outpatient Prescriptions on File Prior to Visit  Medication Sig Dispense Refill  . amLODipine (NORVASC) 10 MG tablet Take 1 tablet (10 mg total) by mouth daily. 90 tablet 3  . aspirin EC 81 MG tablet Take 81 mg by mouth daily.      . B Complex-C (B-COMPLEX WITH VITAMIN C) tablet Take 1 tablet by mouth daily.    . calcitRIOL (ROCALTROL) 0.25 MCG capsule Take 3 capsules (0.75 mcg total) by mouth daily. Take 3 cap daily 270 capsule 1  . calcium carbonate (TUMS - DOSED IN MG ELEMENTAL CALCIUM) 500 MG chewable tablet Chew 4 tablets by mouth 2 (two) times daily.     . cholecalciferol (VITAMIN D) 1000 UNITS tablet Take 1,000 Units by mouth daily.    . diazepam (VALIUM) 5 MG tablet Take 5 mg by mouth at bedtime as needed. (SLEEP)  0  . famotidine (PEPCID) 20 MG tablet Take 1 tablet by mouth two  times daily 180 tablet 1  . gabapentin (NEURONTIN) 100 MG capsule Take 3 capsules (300 mg total) by mouth 3 (three) times daily. 270 capsule 11  . isosorbide mononitrate (IMDUR) 60 MG 24 hr tablet Take 1.5 tablets (90 mg total) by mouth daily. TAKE 1 1/2 TABS BY MOUTH DAILY. 135 tablet 1  . levothyroxine (SYNTHROID, LEVOTHROID) 100 MCG tablet Take 1 tablet (100 mcg total) by mouth daily. 90 tablet 1  . meclizine (ANTIVERT) 25 MG tablet Take 1 tablet (25 mg total) by mouth 3 (three) times daily as needed for dizziness. 25 tablet 0  . Menthol, Topical Analgesic, (ICY HOT EX) Apply 1 patch topically daily.    . methocarbamol (ROBAXIN) 500 MG tablet Take 2 tablets (1,000 mg total) by mouth every 8 (eight) hours as needed (shoulder pain). 30 tablet 0  . nitroGLYCERIN (NITROSTAT) 0.4 MG SL tablet Dissolve 1 tablet under the tongue every 5 minutes as   needed for chest pain as   directed 25 tablet 2  . pantoprazole (PROTONIX) 40 MG tablet Take 1 tablet (40 mg total) by mouth daily. 90 tablet 3  .  rosuvastatin (CRESTOR) 40 MG tablet Take 1 tablet (40 mg total) by mouth daily. 90 tablet 1   Current Facility-Administered Medications on File Prior to Visit  Medication Dose Route Frequency Provider Last Rate Last Dose  . iohexol (OMNIPAQUE) 350 MG/ML injection 100 mL  100 mL Intravenous Once PRN Sherwood Gambler, MD        BP 126/72   Pulse 64   Temp 97.5 F (36.4 C) (Oral)   Resp (!) 96  Comment: room air  Ht 5\' 4"  (1.626 m)   Wt 197 lb 6.4 oz (89.5 kg)   BMI 33.88 kg/m       Objective:   Physical Exam  Constitutional: She is oriented to person, place, and time. She appears well-developed and well-nourished.  Cardiovascular: Normal rate, regular rhythm and normal heart sounds.   No murmur heard. Pulmonary/Chest: Effort normal and breath sounds normal. No respiratory distress. She has no wheezes.  Abdominal: Soft. She exhibits no distension. There is no tenderness. There is no rebound.  Genitourinary:    No vaginal discharge found.  Genitourinary Comments: Vaginal mucosa is dry and atrophic.  Normal cervix and introitus. No visible blood Small, tender, pea sized abscess noted at the base of the right vulva.   Neurological: She is alert and oriented to person, place, and time.  Skin: Skin is warm and dry.  Psychiatric: She has a normal mood and affect. Her behavior is normal. Judgment and thought content normal.          Assessment & Plan:  Vulvar abscess- small/superficial- no associated cellulitis at this time. I believe the blood she saw in her underwear was from this abscess.  Advised pt to begin doxycycline.   Also advised pt as follows:   For small abscess on your vulva- please either soak in a warm bath twice a day or apply a warm compress twice a day to help the abscess to drain. Call if increased pain, redness or swelling.  Keep your upcoming appointment with Dr. Charlett Blake on Thursday. Call us if you see any further blood in your underwear.

## 2016-07-23 NOTE — Progress Notes (Signed)
Pre visit review using our clinic review tool, if applicable. No additional management support is needed unless otherwise documented below in the visit note. 

## 2016-07-23 NOTE — Patient Instructions (Addendum)
For small abscess on your vulva- please either soak in a warm bath twice a day or apply a warm compress twice a day to help the abscess to drain. Begin doxycycline twice daily. Call if increased pain, redness or swelling.  Keep your upcoming appointment with Dr. Charlett Blake on Thursday. Call us if you see any further blood in your underwear.

## 2016-07-26 ENCOUNTER — Encounter: Payer: Self-pay | Admitting: Family Medicine

## 2016-07-26 ENCOUNTER — Ambulatory Visit (INDEPENDENT_AMBULATORY_CARE_PROVIDER_SITE_OTHER): Payer: Medicare Other | Admitting: Family Medicine

## 2016-07-26 VITALS — BP 120/62 | HR 78 | Temp 98.7°F | Ht 64.0 in | Wt 196.4 lb

## 2016-07-26 DIAGNOSIS — K2100 Gastro-esophageal reflux disease with esophagitis, without bleeding: Secondary | ICD-10-CM

## 2016-07-26 DIAGNOSIS — E039 Hypothyroidism, unspecified: Secondary | ICD-10-CM

## 2016-07-26 DIAGNOSIS — I10 Essential (primary) hypertension: Secondary | ICD-10-CM

## 2016-07-26 DIAGNOSIS — H8110 Benign paroxysmal vertigo, unspecified ear: Secondary | ICD-10-CM | POA: Diagnosis not present

## 2016-07-26 MED ORDER — CLOPIDOGREL BISULFATE 75 MG PO TABS: 75.0000 mg | ORAL_TABLET | Freq: Every day | ORAL | 3 refills | Status: DC

## 2016-07-26 MED ORDER — FAMOTIDINE 20 MG PO TABS: ORAL_TABLET | ORAL | 1 refills | Status: DC

## 2016-07-26 MED ORDER — MECLIZINE HCL 25 MG PO TABS: 25.0000 mg | ORAL_TABLET | Freq: Three times a day (TID) | ORAL | 1 refills | Status: DC | PRN

## 2016-07-26 NOTE — Patient Instructions (Signed)
NOW probiotic 10 strains in 1 cap daily Hydrate well and no skipping meals, lean protein roughly every 4 hours  Vertigo Vertigo means you feel like you or your surroundings are moving when they are not. Vertigo can be dangerous if it occurs when you are at work, driving, or performing difficult activities.  CAUSES  Vertigo occurs when there is a conflict of signals sent to your brain from the visual and sensory systems in your body. There are many different causes of vertigo, including:  Infections, especially in the inner ear.  A bad reaction to a drug or misuse of alcohol and medicines.  Withdrawal from drugs or alcohol.  Rapidly changing positions, such as lying down or rolling over in bed.  A migraine headache.  Decreased blood flow to the brain.  Increased pressure in the brain from a head injury, infection, tumor, or bleeding. SYMPTOMS  You may feel as though the world is spinning around or you are falling to the ground. Because your balance is upset, vertigo can cause nausea and vomiting. You may have involuntary eye movements (nystagmus). DIAGNOSIS  Vertigo is usually diagnosed by physical exam. If the cause of your vertigo is unknown, your caregiver may perform imaging tests, such as an MRI scan (magnetic resonance imaging). TREATMENT  Most cases of vertigo resolve on their own, without treatment. Depending on the cause, your caregiver may prescribe certain medicines. If your vertigo is related to body position issues, your caregiver may recommend movements or procedures to correct the problem. In rare cases, if your vertigo is caused by certain inner ear problems, you may need surgery. HOME CARE INSTRUCTIONS   Follow your caregiver's instructions.  Avoid driving.  Avoid operating heavy machinery.  Avoid performing any tasks that would be dangerous to you or others during a vertigo episode.  Tell your caregiver if you notice that certain medicines seem to be causing your  vertigo. Some of the medicines used to treat vertigo episodes can actually make them worse in some people. SEEK IMMEDIATE MEDICAL CARE IF:   Your medicines do not relieve your vertigo or are making it worse.  You develop problems with talking, walking, weakness, or using your arms, hands, or legs.  You develop severe headaches.  Your nausea or vomiting continues or gets worse.  You develop visual changes.  A family member notices behavioral changes.  Your condition gets worse. MAKE SURE YOU:  Understand these instructions.  Will watch your condition.  Will get help right away if you are not doing well or get worse.   This information is not intended to replace advice given to you by your health care provider. Make sure you discuss any questions you have with your health care provider.   Document Released: 09/19/2005 Document Revised: 03/03/2012 Document Reviewed: 04/04/2015 Elsevier Interactive Patient Education [employer].

## 2016-07-27 ENCOUNTER — Other Ambulatory Visit (INDEPENDENT_AMBULATORY_CARE_PROVIDER_SITE_OTHER): Payer: Medicare Other

## 2016-07-27 DIAGNOSIS — I10 Essential (primary) hypertension: Secondary | ICD-10-CM

## 2016-07-27 DIAGNOSIS — E039 Hypothyroidism, unspecified: Secondary | ICD-10-CM

## 2016-07-27 LAB — COMPREHENSIVE METABOLIC PANEL
ALT: 17 U/L (ref 0–35)
AST: 18 U/L (ref 0–37)
Albumin: 4.1 g/dL (ref 3.5–5.2)
Alkaline Phosphatase: 80 U/L (ref 39–117)
BUN: 27 mg/dL — ABNORMAL HIGH (ref 6–23)
CALCIUM: 9.4 mg/dL (ref 8.4–10.5)
CHLORIDE: 103 meq/L (ref 96–112)
CO2: 31 meq/L (ref 19–32)
CREATININE: 1.21 mg/dL — AB (ref 0.40–1.20)
GFR: 46.83 mL/min — AB (ref >60.00)
GLUCOSE: 82 mg/dL (ref 70–99)
Potassium: 4.7 mEq/L (ref 3.5–5.1)
Sodium: 142 mEq/L (ref 135–145)
Total Bilirubin: 0.4 mg/dL (ref 0.2–1.2)
Total Protein: 7.6 g/dL (ref 6.0–8.3)

## 2016-07-27 LAB — TSH: TSH: 8.07 u[IU]/mL — AB (ref 0.35–4.50)

## 2016-07-30 ENCOUNTER — Other Ambulatory Visit: Payer: Self-pay | Admitting: Family Medicine

## 2016-07-30 MED ORDER — LEVOTHYROXINE SODIUM 100 MCG PO TABS: ORAL_TABLET | ORAL | 1 refills | Status: DC

## 2016-08-05 ENCOUNTER — Encounter: Payer: Self-pay | Admitting: Family Medicine

## 2016-08-05 DIAGNOSIS — H8110 Benign paroxysmal vertigo, unspecified ear: Secondary | ICD-10-CM

## 2016-08-05 HISTORY — DX: Benign paroxysmal vertigo, unspecified ear: H81.10

## 2016-08-05 NOTE — Assessment & Plan Note (Signed)
Well controlled, no changes to meds. Encouraged heart healthy diet such as the DASH diet and exercise as tolerated.  °

## 2016-08-05 NOTE — Assessment & Plan Note (Signed)
On Levothyroxine, continue to monitor 

## 2016-08-05 NOTE — Assessment & Plan Note (Signed)
Avoid offending foods, start probiotics. Do not eat large meals in late evening and consider raising head of bed.  

## 2016-08-05 NOTE — Assessment & Plan Note (Signed)
Encouraged increase hydration, small, frequent meals with lean proteins, meclizine prn and report worsening symptoms for referral

## 2016-08-05 NOTE — Progress Notes (Signed)
Patient ID: [name], female   DOB: 1946/11/18, 70 y.o.   MRN: [id number]   Subjective:    Patient ID: [name], female    DOB: 1946/01/13, 70 y.o.   MRN: [id number]  Chief Complaint  Patient presents with  . Hospitalization Follow-up    Vertigo    HPI Patient is in today for follow up. She has been struggling with some episodes of spinning recently. Worse when she roles over in bed. Bad enough to make her feel somewhat nauseous and sweaty.no headaches or other neurologic complaints. No falls or vomiting. Denies CP/palp/SOB/HA/congestion/fevers/GI or GU c/o. Taking meds as prescribed  Past Medical History:  Diagnosis Date  . Back pain 05/31/2013  . Benign paroxysmal positional vertigo 08/05/2016  . CAD (coronary artery disease)    a. NSTEMI 10/2011: [place] 11/19/11: pLAD 30%, oOM 60%, AVCFX 30%, CFX after OM2 30%, pRCA 60 and 70%, then 99%, AM 80-90% with TIMI 3 flow.  PCI: Promus DES x 2 to RCA; b. 06/2012 Cath: patent RCA stents w/ subtl occl of Acute Marginal (jailed)->Med rx; c. 05/2015 MV: EF 59%, mod mid infsept/inf/ap lat/ap infarct with peri-infarct isch-->Med Rx; d. 02/2016 Cath: LM nl, LAD 8m, RI 50, RCA patent stents.  . Colitis   . GERD (gastroesophageal reflux disease)    occasional  . Hyperlipidemia   . Hypertensive heart disease    a. Echocardiogram 11/19/11: Difficult acoustic windows, EF 60-65%, normal LV wall thickness, grade 1 diastolic dysfunction  . Hypoparathyroidism ([place])   . Multinodular thyroid    Goiter s/p thyroidectomy in 2007 with post-op  hypocalcemia and post-op hypothyroidism/hypoparathyroidism with hypocalcemia  . Neck pain on right side 07/13/2013  . Nocturia 05/31/2013  . Pain in right axilla 03/13/2016  . Palpitations    a. 03/2012 - patient set up for event monitor but did not wear correctly -  she declined wearing a repeat monitor  . Post-surgical hypothyroidism   . Sun-damaged skin 12/05/2014  . Tubular adenoma of colon 06/2011  .  Unspecified constipation 05/31/2013  . Vertigo   . Zinc deficiency 11/26/2015    Past Surgical History:  Procedure Laterality Date  . CARDIAC CATHETERIZATION N/A 03/08/2016   Procedure: Left Heart Cath and Coronary Angiography;  Surgeon: Jettie Booze, MD;  Location: [place] [facility];  Service: Cardiovascular;  Laterality: N/A;  . COLONOSCOPY  Aenomatous polyps   07/05/2011  . COLONOSCOPY N/A 09/28/2014   Procedure: COLONOSCOPY;  Surgeon: Ladene Artist, MD;  Location: [facility];  Service: Endoscopy;  Laterality: N/A;  . CORONARY ANGIOPLASTY WITH STENT PLACEMENT    . LEFT HEART CATHETERIZATION WITH CORONARY ANGIOGRAM N/A 07/17/2012   Procedure: LEFT HEART CATHETERIZATION WITH CORONARY ANGIOGRAM;  Surgeon: Hillary Bow, MD;  Location: [facility] CATH LAB;  Service: Cardiovascular;  Laterality: N/A;  . PERCUTANEOUS CORONARY STENT INTERVENTION (PCI-S) N/A 11/19/2011   Procedure: PERCUTANEOUS CORONARY STENT INTERVENTION (PCI-S);  Surgeon: Hillary Bow, MD;  Location: [facility] CATH LAB;  Service: Cardiovascular;  Laterality: N/A;  . SHOULDER ARTHROSCOPY W/ ROTATOR CUFF REPAIR     right  . TOTAL THYROIDECTOMY  2007   GOITER    Family History  Problem Relation Age of Onset  . Colon cancer Brother   . Cancer Brother     COLON  . Hypertension Father   . Heart disease Father   . Heart attack Father   . Blindness Sister   . Congestive Heart Failure Sister   . Hypertension Sister   .  Thyroid disease Sister   . Cancer Brother     multiple myelomas  . Diabetes Neg Hx   . Prostate cancer Neg Hx   . Breast cancer Neg Hx     Social History   Social History  . Marital status: Married    Spouse name: N/A  . Number of children: 4  . Years of education: 14   Occupational History  . 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 Smokeless tobacco: Never Used  . Alcohol use No  . Drug use: No  . Sexual activity: Not on file   Other Topics  Concern  . Not on file   Social History Narrative   Lives at home alone.  Her son lives near her.   Right-handed.   1 cup coffee per day.    Outpatient Medications Prior to Visit  Medication Sig Dispense Refill  . amLODipine (NORVASC) 10 MG tablet Take 1 tablet (10 mg total) by mouth daily. 90 tablet 3  . aspirin EC 81 MG tablet Take 81 mg by mouth daily.      . B Complex-C (B-COMPLEX WITH VITAMIN C) tablet Take 1 tablet by mouth daily.    . calcitRIOL (ROCALTROL) 0.25 MCG capsule Take 3 capsules (0.75 mcg total) by mouth daily. Take 3 cap daily 270 capsule 1  . calcium carbonate (TUMS - DOSED IN MG ELEMENTAL CALCIUM) 500 MG chewable tablet Chew 4 tablets by mouth 2 (two) times daily.     . cholecalciferol (VITAMIN D) 1000 UNITS tablet Take 1,000 Units by mouth daily.    Marland Kitchen doxycycline (VIBRA-TABS) 100 MG tablet Take 1 tablet (100 mg total) by mouth 2 (two) times daily. 14 tablet 0  . gabapentin (NEURONTIN) 100 MG capsule Take 3 capsules (300 mg total) by mouth 3 (three) times daily. 270 capsule 11  . isosorbide mononitrate (IMDUR) 60 MG 24 hr tablet Take 1.5 tablets (90 mg total) by mouth daily. TAKE 1 1/2 TABS BY MOUTH DAILY. 135 tablet 1  . Menthol, Topical Analgesic, (ICY HOT EX) Apply 1 patch topically daily.    . methocarbamol (ROBAXIN) 500 MG tablet Take 2 tablets (1,000 mg total) by mouth every 8 (eight) hours as needed (shoulder pain). 30 tablet 0  . nitroGLYCERIN (NITROSTAT) 0.4 MG SL tablet Dissolve 1 tablet under the tongue every 5 minutes as   needed for chest pain as   directed 25 tablet 2  . pantoprazole (PROTONIX) 40 MG tablet Take 1 tablet (40 mg total) by mouth daily. 90 tablet 3  . rosuvastatin (CRESTOR) 40 MG tablet Take 1 tablet (40 mg total) by mouth daily. 90 tablet 1  . clopidogrel (PLAVIX) 75 MG tablet Take 75 mg by mouth daily.    . diazepam (VALIUM) 5 MG tablet Take 5 mg by mouth at bedtime as needed. (SLEEP)  0  . famotidine (PEPCID) 20 MG tablet Take 1 tablet by  mouth two  times daily 180 tablet 1  . levothyroxine (SYNTHROID, LEVOTHROID) 100 MCG tablet Take 1 tablet (100 mcg total) by mouth daily. 90 tablet 1  . meclizine (ANTIVERT) 25 MG tablet Take 1 tablet (25 mg total) by mouth 3 (three) times daily as needed for dizziness. 25 tablet 0   Facility-Administered Medications Prior to Visit  Medication Dose Route Frequency Provider Last Rate Last Dose  . iohexol (OMNIPAQUE) 350 MG/ML injection 100 mL  100 mL Intravenous Once PRN Sherwood Gambler, MD  Allergies  Allergen Reactions  . Zetia [Ezetimibe] Other (See Comments)    Myalgia, paresthesias and weakness    Review of Systems  Constitutional: Negative for fever and malaise/fatigue.  HENT: Negative for congestion.   Eyes: Negative for blurred vision.  Respiratory: Negative for shortness of breath.   Cardiovascular: Negative for chest pain, palpitations and leg swelling.  Gastrointestinal: Negative for abdominal pain, blood in stool and nausea.  Genitourinary: Negative for dysuria and frequency.  Musculoskeletal: Negative for falls.  Skin: Negative for rash.  Neurological: Positive for dizziness. Negative for loss of consciousness and headaches.  Endo/Heme/Allergies: Negative for environmental allergies.  Psychiatric/Behavioral: Negative for depression. The patient is not nervous/anxious.        Objective:    Physical Exam  Constitutional: She is oriented to person, place, and time. She appears well-developed and well-nourished. No distress.  HENT:  Head: Normocephalic and atraumatic.  Nose: Nose normal.  Eyes: Right eye exhibits no discharge. Left eye exhibits no discharge.  Neck: Normal range of motion. Neck supple.  Cardiovascular: Normal rate and regular rhythm.   Pulmonary/Chest: Effort normal and breath sounds normal.  Abdominal: Soft. Bowel sounds are normal. There is no tenderness.  Musculoskeletal: She exhibits no edema.  Neurological: She is alert and oriented to  person, place, and time.  Skin: Skin is warm and dry.  Psychiatric: She has a normal mood and affect.  Nursing note and vitals reviewed.   BP 120/62   Pulse 78   Temp 98.7 F (37.1 C) (Oral)   Ht 5\' 4"  (1.626 m)   Wt 196 lb 6.4 oz (89.1 kg)   SpO2 97%   BMI 33.71 kg/m  Wt Readings from Last 3 Encounters:  07/26/16 196 lb 6.4 oz (89.1 kg)  07/23/16 197 lb 6.4 oz (89.5 kg)  06/13/16 199 lb (90.3 kg)     Lab Results  Component Value Date   WBC 8.4 06/28/2016   HGB 13.9 06/28/2016   HCT 42.6 06/28/2016   PLT 269 06/28/2016   GLUCOSE 82 07/27/2016   CHOL 171 01/23/2016   TRIG 108 01/23/2016   HDL 48 01/23/2016   LDLDIRECT 83.8 08/11/2012   LDLCALC 101 01/23/2016   ALT 17 07/27/2016   AST 18 07/27/2016   NA 142 07/27/2016   K 4.7 07/27/2016   CL 103 07/27/2016   CREATININE 1.21 (H) 07/27/2016   BUN 27 (H) 07/27/2016   CO2 31 07/27/2016   TSH 8.07 (H) 07/27/2016   INR 0.86 03/07/2016   HGBA1C 5.9 05/07/2016    Lab Results  Component Value Date   TSH 8.07 (H) 07/27/2016   Lab Results  Component Value Date   WBC 8.4 06/28/2016   HGB 13.9 06/28/2016   HCT 42.6 06/28/2016   MCV 86.4 06/28/2016   PLT 269 06/28/2016   Lab Results  Component Value Date   NA 142 07/27/2016   K 4.7 07/27/2016   CO2 31 07/27/2016   GLUCOSE 82 07/27/2016   BUN 27 (H) 07/27/2016   CREATININE 1.21 (H) 07/27/2016   BILITOT 0.4 07/27/2016   ALKPHOS 80 07/27/2016   AST 18 07/27/2016   ALT 17 07/27/2016   PROT 7.6 07/27/2016   ALBUMIN 4.1 07/27/2016   CALCIUM 9.4 07/27/2016   ANIONGAP 9 06/28/2016   GFR 46.83 (L) 07/27/2016   Lab Results  Component Value Date   CHOL 171 01/23/2016   Lab Results  Component Value Date   HDL 48 01/23/2016   Lab Results  Component  Value Date   LDLCALC 101 01/23/2016   Lab Results  Component Value Date   TRIG 108 01/23/2016   Lab Results  Component Value Date   CHOLHDL 3.6 01/23/2016   Lab Results  Component Value Date   HGBA1C  5.9 05/07/2016       Assessment & Plan:   Problem List Items Addressed This Visit    Hypothyroidism - Primary    On Levothyroxine, continue to monitor      Relevant Orders   TSH (Completed)   Essential hypertension, benign    Well controlled, no changes to meds. Encouraged heart healthy diet such as the DASH diet and exercise as tolerated.       Esophageal reflux    Avoid offending foods, start probiotics. Do not eat large meals in late evening and consider raising head of bed.       Relevant Medications   meclizine (ANTIVERT) 25 MG tablet   famotidine (PEPCID) 20 MG tablet   Benign paroxysmal positional vertigo    Encouraged increase hydration, small, frequent meals with lean proteins, meclizine prn and report worsening symptoms for referral       Other Visit Diagnoses    Essential hypertension       Relevant Orders   Comprehensive metabolic panel (Completed)      I have discontinued Ms. Xiang's diazepam and clopidogrel. I am also having her start on clopidogrel. Additionally, I am having her maintain her calcium carbonate, aspirin EC, B-complex with vitamin C, cholecalciferol, (Menthol, Topical Analgesic, (ICY HOT EX)), gabapentin, nitroGLYCERIN, methocarbamol, rosuvastatin, amLODipine, calcitRIOL, isosorbide mononitrate, pantoprazole, doxycycline, meclizine, and famotidine.  Meds ordered this encounter  Medications  . meclizine (ANTIVERT) 25 MG tablet    Sig: Take 1 tablet (25 mg total) by mouth 3 (three) times daily as needed for dizziness.    Dispense:  60 tablet    Refill:  1  . clopidogrel (PLAVIX) 75 MG tablet    Sig: Take 1 tablet (75 mg total) by mouth daily.    Dispense:  90 tablet    Refill:  3  . famotidine (PEPCID) 20 MG tablet    Sig: Take 1 tablet by mouth two  times daily    Dispense:  180 tablet    Refill:  1     BLYTH, STACEY, MD

## 2016-08-06 ENCOUNTER — Ambulatory Visit: Payer: Medicare Other | Admitting: Family Medicine

## 2016-08-07 ENCOUNTER — Other Ambulatory Visit: Payer: Self-pay | Admitting: Family Medicine

## 2016-08-07 DIAGNOSIS — E60 Dietary zinc deficiency: Secondary | ICD-10-CM

## 2016-08-07 DIAGNOSIS — E039 Hypothyroidism, unspecified: Secondary | ICD-10-CM

## 2016-08-07 DIAGNOSIS — E876 Hypokalemia: Secondary | ICD-10-CM

## 2016-08-07 DIAGNOSIS — Z0000 Encounter for general adult medical examination without abnormal findings: Secondary | ICD-10-CM

## 2016-08-07 DIAGNOSIS — I10 Essential (primary) hypertension: Secondary | ICD-10-CM

## 2016-08-07 DIAGNOSIS — R1013 Epigastric pain: Secondary | ICD-10-CM

## 2016-08-07 DIAGNOSIS — E892 Postprocedural hypoparathyroidism: Secondary | ICD-10-CM

## 2016-11-16 ENCOUNTER — Other Ambulatory Visit: Payer: Self-pay | Admitting: Family Medicine

## 2016-11-16 DIAGNOSIS — E039 Hypothyroidism, unspecified: Secondary | ICD-10-CM

## 2016-11-16 DIAGNOSIS — E892 Postprocedural hypoparathyroidism: Secondary | ICD-10-CM

## 2016-11-16 DIAGNOSIS — R1013 Epigastric pain: Secondary | ICD-10-CM

## 2016-11-16 DIAGNOSIS — E876 Hypokalemia: Secondary | ICD-10-CM

## 2016-11-16 DIAGNOSIS — I10 Essential (primary) hypertension: Secondary | ICD-10-CM

## 2016-11-16 DIAGNOSIS — Z0000 Encounter for general adult medical examination without abnormal findings: Secondary | ICD-10-CM

## 2016-11-16 DIAGNOSIS — E60 Dietary zinc deficiency: Secondary | ICD-10-CM

## 2016-11-26 ENCOUNTER — Encounter: Payer: Self-pay | Admitting: Family Medicine

## 2016-11-26 ENCOUNTER — Ambulatory Visit (INDEPENDENT_AMBULATORY_CARE_PROVIDER_SITE_OTHER): Payer: Medicare Other | Admitting: Family Medicine

## 2016-11-26 DIAGNOSIS — Z7902 Long term (current) use of antithrombotics/antiplatelets: Secondary | ICD-10-CM | POA: Diagnosis not present

## 2016-11-26 DIAGNOSIS — I10 Essential (primary) hypertension: Secondary | ICD-10-CM

## 2016-11-26 DIAGNOSIS — R0789 Other chest pain: Secondary | ICD-10-CM

## 2016-11-26 DIAGNOSIS — E785 Hyperlipidemia, unspecified: Secondary | ICD-10-CM | POA: Diagnosis not present

## 2016-11-26 DIAGNOSIS — K219 Gastro-esophageal reflux disease without esophagitis: Secondary | ICD-10-CM

## 2016-11-26 DIAGNOSIS — E60 Dietary zinc deficiency: Secondary | ICD-10-CM

## 2016-11-26 DIAGNOSIS — H8110 Benign paroxysmal vertigo, unspecified ear: Secondary | ICD-10-CM | POA: Diagnosis not present

## 2016-11-26 DIAGNOSIS — K2100 Gastro-esophageal reflux disease with esophagitis, without bleeding: Secondary | ICD-10-CM

## 2016-11-26 DIAGNOSIS — E039 Hypothyroidism, unspecified: Secondary | ICD-10-CM

## 2016-11-26 HISTORY — DX: Dietary zinc deficiency: E60

## 2016-11-26 LAB — COMPREHENSIVE METABOLIC PANEL
ALBUMIN: 4.2 g/dL (ref 3.5–5.2)
ALT: 18 U/L (ref 0–35)
AST: 18 U/L (ref 0–37)
Alkaline Phosphatase: 89 U/L (ref 39–117)
BUN: 22 mg/dL (ref 6–23)
CHLORIDE: 102 meq/L (ref 96–112)
CO2: 31 mEq/L (ref 19–32)
Calcium: 9.6 mg/dL (ref 8.4–10.5)
Creatinine, Ser: 1.14 mg/dL (ref 0.40–1.20)
GFR: 50.12 mL/min — ABNORMAL LOW (ref >60.00)
Glucose, Bld: 102 mg/dL — ABNORMAL HIGH (ref 70–99)
POTASSIUM: 4.2 meq/L (ref 3.5–5.1)
SODIUM: 143 meq/L (ref 135–145)
Total Bilirubin: 0.5 mg/dL (ref 0.2–1.2)
Total Protein: 7.9 g/dL (ref 6.0–8.3)

## 2016-11-26 LAB — CBC
HEMATOCRIT: 43.2 % (ref 36.0–46.0)
HEMOGLOBIN: 14.4 g/dL (ref 12.0–15.0)
MCHC: 33.4 g/dL (ref 30.0–36.0)
MCV: 83.1 fl (ref 78.0–100.0)
PLATELETS: 275 10*3/uL (ref 150.0–400.0)
RBC: 5.19 Mil/uL — AB (ref 3.87–5.11)
RDW: 15.1 % (ref 11.5–15.5)
WBC: 8.1 10*3/uL (ref 4.0–10.5)

## 2016-11-26 LAB — LIPID PANEL
CHOL/HDL RATIO: 4
Cholesterol: 180 mg/dL (ref 0–200)
HDL: 47.8 mg/dL (ref >39.00)
LDL Cholesterol: 99 mg/dL (ref 0–99)
NONHDL: 132.17
Triglycerides: 167 mg/dL — ABNORMAL HIGH (ref 0.0–149.0)
VLDL: 33.4 mg/dL (ref 0.0–40.0)

## 2016-11-26 LAB — TSH: TSH: 1.51 u[IU]/mL (ref 0.35–4.50)

## 2016-11-26 LAB — MAGNESIUM: Magnesium: 2.2 mg/dL (ref 1.5–2.5)

## 2016-11-26 LAB — VITAMIN D 25 HYDROXY (VIT D DEFICIENCY, FRACTURES): VITD: 47.99 ng/mL (ref 30.00–100.00)

## 2016-11-26 MED ORDER — PANTOPRAZOLE SODIUM 40 MG PO TBEC: 40.0000 mg | DELAYED_RELEASE_TABLET | Freq: Every day | ORAL | 3 refills | Status: DC

## 2016-11-26 NOTE — Progress Notes (Signed)
Pre visit review using our clinic review tool, if applicable. No additional management support is needed unless otherwise documented below in the visit note. 

## 2016-11-26 NOTE — Assessment & Plan Note (Signed)
Well controlled, no changes to meds. Encouraged heart healthy diet such as the DASH diet and exercise as tolerated.  °

## 2016-11-26 NOTE — Progress Notes (Signed)
Patient ID: [name], female   DOB: 07/18/1946, 70 y.o.   MRN: [id number]   Subjective:    Patient ID: [name], female    DOB: [birth year], 70 y.o.   MRN: [id number]  Chief Complaint  Patient presents with  . Hypothyroidism    15-month follow up.    HPI Patient is in today for follow up. She has just returned from [place] after a 3 month visit. Unfortunately she had recurrent vertigo/disequilibrium issues while there she went to see an MD there and was prescribed what looked like an antihistamines. This helps minimally. No recent illness for patient but she does note some upper back pain at times. Encouraged increased hydration, 64 ounces of clear fluids daily. Minimize alcohol and caffeine. Eat small frequent meals with lean proteins and complex carbs. Avoid high and low blood sugars. Get adequate sleep, 7-8 hours a night. Needs exercise daily preferably in the morning.  Past Medical History:  Diagnosis Date  . Back pain 05/31/2013  . Benign paroxysmal positional vertigo 08/05/2016  . CAD (coronary artery disease)    a. NSTEMI 10/2011: [facility] 11/19/11: pLAD 30%, oOM 60%, AVCFX 30%, CFX after OM2 30%, pRCA 60 and 70%, then 99%, AM 80-90% with TIMI 3 flow.  PCI: Promus DES x 2 to RCA; b. 06/2012 Cath: patent RCA stents w/ subtl occl of Acute Marginal (jailed)->Med rx; c. 05/2015 MV: EF 59%, mod mid infsept/inf/ap lat/ap infarct with peri-infarct isch-->Med Rx; d. 02/2016 Cath: LM nl, LAD 37m, RI 50, RCA patent stents.  . Colitis   . GERD (gastroesophageal reflux disease)    occasional  . Hyperlipidemia   . Hypertensive heart disease    a. Echocardiogram 11/19/11: Difficult acoustic windows, EF 60-65%, normal LV wall thickness, grade 1 diastolic dysfunction  . Hypoparathyroidism ([place])   . Low zinc level 11/26/2016  . Multinodular thyroid    Goiter s/p thyroidectomy in 2007 with post-op  hypocalcemia and post-op hypothyroidism/hypoparathyroidism with hypocalcemia  . Neck pain on right  side 07/13/2013  . Nocturia 05/31/2013  . Pain in right axilla 03/13/2016  . Palpitations    a. 03/2012 - patient set up for event monitor but did not wear correctly -  she declined wearing a repeat monitor  . Post-surgical hypothyroidism   . Sun-damaged skin 12/05/2014  . Tubular adenoma of colon 06/2011  . Unspecified constipation 05/31/2013  . Vertigo   . Zinc deficiency 11/26/2015    Past Surgical History:  Procedure Laterality Date  . CARDIAC CATHETERIZATION N/A 03/08/2016   Procedure: Left Heart Cath and Coronary Angiography;  Surgeon: Jettie Booze, MD;  Location: [facility];  Service: Cardiovascular;  Laterality: N/A;  . COLONOSCOPY  Aenomatous polyps   07/05/2011  . COLONOSCOPY N/A 09/28/2014   Procedure: COLONOSCOPY;  Surgeon: Ladene Artist, MD;  Location: [facility];  Service: Endoscopy;  Laterality: N/A;  . CORONARY ANGIOPLASTY WITH STENT PLACEMENT    . LEFT HEART CATHETERIZATION WITH CORONARY ANGIOGRAM N/A 07/17/2012   Procedure: LEFT HEART CATHETERIZATION WITH CORONARY ANGIOGRAM;  Surgeon: Hillary Bow, MD;  Location: [facility] CATH LAB;  Service: Cardiovascular;  Laterality: N/A;  . PERCUTANEOUS CORONARY STENT INTERVENTION (PCI-S) N/A 11/19/2011   Procedure: PERCUTANEOUS CORONARY STENT INTERVENTION (PCI-S);  Surgeon: Hillary Bow, MD;  Location: [facility] CATH LAB;  Service: Cardiovascular;  Laterality: N/A;  . SHOULDER ARTHROSCOPY W/ ROTATOR CUFF REPAIR     right  . TOTAL THYROIDECTOMY  2007   GOITER    Family History  Problem Relation Age of Onset  . Colon cancer Brother   . Cancer Brother     COLON  . Hypertension Father   . Heart disease Father   . Heart attack Father   . Blindness Sister   . Congestive Heart Failure Sister   . Hypertension Sister   . Thyroid disease Sister   . Cancer Brother     multiple myelomas  . Diabetes Neg Hx   . Prostate cancer Neg Hx   . Breast cancer Neg Hx     Social History   Social History  . Marital status:  Married    Spouse name: N/A  . Number of children: 4  . Years of education: 14   Occupational History  . Sales person at [employer]  . Smoking status: Never Smoker  . Smokeless tobacco: Never Used  . Alcohol use No  . Drug use: No  . Sexual activity: Not on file   Other Topics Concern  . Not on file   Social History Narrative   Lives at home alone.  Her son lives near her.   Right-handed.   1 cup coffee per day.    Outpatient Medications Prior to Visit  Medication Sig Dispense Refill  . amLODipine (NORVASC) 10 MG tablet Take 1 tablet (10 mg total) by mouth daily. 90 tablet 3  . aspirin EC 81 MG tablet Take 81 mg by mouth daily.      . B Complex-C (B-COMPLEX WITH VITAMIN C) tablet Take 1 tablet by mouth daily.    . calcitRIOL (ROCALTROL) 0.25 MCG capsule TAKE 3 CAPSULES(0.75 MCG) BY MOUTH DAILY 270 capsule 0  . calcium carbonate (TUMS - DOSED IN MG ELEMENTAL CALCIUM) 500 MG chewable tablet Chew 4 tablets by mouth 2 (two) times daily.     . cholecalciferol (VITAMIN D) 1000 UNITS tablet Take 1,000 Units by mouth daily.    . clopidogrel (PLAVIX) 75 MG tablet Take 1 tablet (75 mg total) by mouth daily. 90 tablet 3  . famotidine (PEPCID) 20 MG tablet Take 1 tablet by mouth two  times daily 180 tablet 1  . gabapentin (NEURONTIN) 100 MG capsule Take 3 capsules (300 mg total) by mouth 3 (three) times daily. 270 capsule 11  . isosorbide mononitrate (IMDUR) 60 MG 24 hr tablet TAKE 1 AND 1/2 TABLETS(90 MG) BY MOUTH DAILY 135 tablet 0  . levothyroxine (SYNTHROID, LEVOTHROID) 100 MCG tablet Take 1 tablet by mouth daily, but take 2 by mouth on Saturday and Tuesday. 114 tablet 1  . levothyroxine (SYNTHROID, LEVOTHROID) 100 MCG tablet TAKE 1 TABLET(100 MCG) BY MOUTH DAILY 90 tablet 0  . Menthol, Topical Analgesic, (ICY HOT EX) Apply 1 patch topically daily.    . methocarbamol (ROBAXIN) 500 MG tablet Take 2 tablets (1,000 mg total) by mouth every 8 (eight) hours as needed  (shoulder pain). 30 tablet 0  . nitroGLYCERIN (NITROSTAT) 0.4 MG SL tablet Dissolve 1 tablet under the tongue every 5 minutes as   needed for chest pain as   directed 25 tablet 2  . rosuvastatin (CRESTOR) 40 MG tablet TAKE 1 TABLET(40 MG) BY MOUTH DAILY 90 tablet 0  . pantoprazole (PROTONIX) 40 MG tablet Take 1 tablet (40 mg total) by mouth daily. 90 tablet 3  . doxycycline (VIBRA-TABS) 100 MG tablet Take 1 tablet (100 mg total) by mouth 2 (two) times daily. (Patient not taking: Reported on 11/26/2016) 14 tablet 0  . meclizine (ANTIVERT) 25  MG tablet Take 1 tablet (25 mg total) by mouth 3 (three) times daily as needed for dizziness. (Patient not taking: Reported on 11/26/2016) 60 tablet 1   Facility-Administered Medications Prior to Visit  Medication Dose Route Frequency Provider Last Rate Last Dose  . iohexol (OMNIPAQUE) 350 MG/ML injection 100 mL  100 mL Intravenous Once PRN Sherwood Gambler, MD        Allergies  Allergen Reactions  . Zetia [Ezetimibe] Other (See Comments)    Myalgia, paresthesias and weakness    Review of Systems  Constitutional: Negative for fever and malaise/fatigue.  HENT: Negative for congestion.   Eyes: Negative for blurred vision.  Respiratory: Negative for shortness of breath.   Cardiovascular: Negative for chest pain, palpitations and leg swelling.  Gastrointestinal: Negative for abdominal pain, blood in stool and nausea.  Genitourinary: Negative for dysuria and frequency.  Musculoskeletal: Negative for falls.  Skin: Negative for rash.  Neurological: Positive for dizziness, focal weakness and headaches. Negative for loss of consciousness.  Endo/Heme/Allergies: Negative for environmental allergies.  Psychiatric/Behavioral: Negative for depression. The patient is nervous/anxious and has insomnia.        Objective:    Physical Exam  Constitutional: She is oriented to person, place, and time. She appears well-developed and well-nourished. No distress.  HENT:    Head: Normocephalic and atraumatic.  Nose: Nose normal.  Eyes: Right eye exhibits no discharge. Left eye exhibits no discharge.  Neck: Normal range of motion. Neck supple.  Cardiovascular: Normal rate and regular rhythm.   No murmur heard. Pulmonary/Chest: Effort normal and breath sounds normal.  Abdominal: Soft. Bowel sounds are normal. There is no tenderness.  Musculoskeletal: She exhibits no edema.  Neurological: She is alert and oriented to person, place, and time. She displays normal reflexes. No cranial nerve deficit. Coordination normal.  Skin: Skin is warm and dry.  Psychiatric: She has a normal mood and affect.  Nursing note and vitals reviewed.   BP 128/82 (BP Location: Right Arm, Patient Position: Sitting, Cuff Size: Large)   Pulse 65   Temp 97.6 F (36.4 C) (Oral)   Wt 199 lb 3.2 oz (90.4 kg)   BMI 34.19 kg/m  Wt Readings from Last 3 Encounters:  11/26/16 199 lb 3.2 oz (90.4 kg)  07/26/16 196 lb 6.4 oz (89.1 kg)  07/23/16 197 lb 6.4 oz (89.5 kg)     Lab Results  Component Value Date   WBC 8.4 06/28/2016   HGB 13.9 06/28/2016   HCT 42.6 06/28/2016   PLT 269 06/28/2016   GLUCOSE 82 07/27/2016   CHOL 171 01/23/2016   TRIG 108 01/23/2016   HDL 48 01/23/2016   LDLDIRECT 83.8 08/11/2012   LDLCALC 101 01/23/2016   ALT 17 07/27/2016   AST 18 07/27/2016   NA 142 07/27/2016   K 4.7 07/27/2016   CL 103 07/27/2016   CREATININE 1.21 (H) 07/27/2016   BUN 27 (H) 07/27/2016   CO2 31 07/27/2016   TSH 8.07 (H) 07/27/2016   INR 0.86 03/07/2016   HGBA1C 5.9 05/07/2016    Lab Results  Component Value Date   TSH 8.07 (H) 07/27/2016   Lab Results  Component Value Date   WBC 8.4 06/28/2016   HGB 13.9 06/28/2016   HCT 42.6 06/28/2016   MCV 86.4 06/28/2016   PLT 269 06/28/2016   Lab Results  Component Value Date   NA 142 07/27/2016   K 4.7 07/27/2016   CO2 31 07/27/2016   GLUCOSE 82 07/27/2016  BUN 27 (H) 07/27/2016   CREATININE 1.21 (H) 07/27/2016    BILITOT 0.4 07/27/2016   ALKPHOS 80 07/27/2016   AST 18 07/27/2016   ALT 17 07/27/2016   PROT 7.6 07/27/2016   ALBUMIN 4.1 07/27/2016   CALCIUM 9.4 07/27/2016   ANIONGAP 9 06/28/2016   GFR 46.83 (L) 07/27/2016   Lab Results  Component Value Date   CHOL 171 01/23/2016   Lab Results  Component Value Date   HDL 48 01/23/2016   Lab Results  Component Value Date   LDLCALC 101 01/23/2016   Lab Results  Component Value Date   TRIG 108 01/23/2016   Lab Results  Component Value Date   CHOLHDL 3.6 01/23/2016   Lab Results  Component Value Date   HGBA1C 5.9 05/07/2016       Assessment & Plan:   Problem List Items Addressed This Visit    Hypothyroidism    On Levothyroxine, continue to monitor. Last TSH was up some      Hyperlipidemia    Tolerating statin, encouraged heart healthy diet, avoid trans fats, minimize simple carbs and saturated fats. Increase exercise as tolerated      Relevant Orders   Lipid panel   Essential hypertension, benign    Well controlled, no changes to meds. Encouraged heart healthy diet such as the DASH diet and exercise as tolerated.       Relevant Orders   TSH   Comprehensive metabolic panel   CBC   Zinc   Magnesium   Ambulatory referral to Neurology   Hypocalcemia    Check CMP and vitamin D level. Taking Vit D 1000 IU daily and a calcium/Vit D tab daily and 4-6 Tums daily      Relevant Orders   VITAMIN D 25 Hydroxy (Vit-D Deficiency, Fractures)   GERD (gastroesophageal reflux disease)    Avoid offending foods, start probiotics. Do not eat large meals in late evening and consider raising head of bed. Doing well on Pantoprazole.       Relevant Medications   pantoprazole (PROTONIX) 40 MG tablet   Atypical chest pain    None during travels      Esophageal reflux   Relevant Medications   pantoprazole (PROTONIX) 40 MG tablet   Antiplatelet or antithrombotic long-term use    Encouraged Plavix daily       Relevant Orders    Ambulatory referral to Neurology   Benign paroxysmal positional vertigo    [place] and suffered symptoms during entire stay of vertigo most notable with rapid movements. Then she describes some episodes of her right eye lid drooping which was transient. Refer to neurology for further consideration      Relevant Orders   Ambulatory referral to Neurology   Low zinc level    Recheck level today      Relevant Orders   Zinc      I have discontinued Ms. Laban's doxycycline and meclizine. I am also having her maintain her calcium carbonate, aspirin EC, B-complex with vitamin C, cholecalciferol, (Menthol, Topical Analgesic, (ICY HOT EX)), gabapentin, nitroGLYCERIN, methocarbamol, amLODipine, clopidogrel, famotidine, levothyroxine, rosuvastatin, calcitRIOL, isosorbide mononitrate, levothyroxine, and pantoprazole.  Meds ordered this encounter  Medications  . DISCONTD: pantoprazole (PROTONIX) 40 MG tablet    Sig: Take 1 tablet (40 mg total) by mouth daily.    Dispense:  90 tablet    Refill:  3  . pantoprazole (PROTONIX) 40 MG tablet    Sig:  Take 1 tablet (40 mg total) by mouth daily.    Dispense:  90 tablet    Refill:  3     Penni Homans, MD

## 2016-11-26 NOTE — Assessment & Plan Note (Signed)
Check CMP and vitamin D level. Taking Vit D 1000 IU daily and a calcium/Vit D tab daily and 4-6 Tums daily

## 2016-11-26 NOTE — Assessment & Plan Note (Signed)
Tolerating statin, encouraged heart healthy diet, avoid trans fats, minimize simple carbs and saturated fats. Increase exercise as tolerated 

## 2016-11-26 NOTE — Assessment & Plan Note (Signed)
On Levothyroxine, continue to monitor. Last TSH was up some

## 2016-11-26 NOTE — Assessment & Plan Note (Signed)
Encouraged Plavix daily

## 2016-11-26 NOTE — Patient Instructions (Signed)
Dizziness Dizziness is a common problem. It is a feeling of unsteadiness or light-headedness. You may feel like you are about to faint. Dizziness can lead to injury if you stumble or fall. Anyone can become dizzy, but dizziness is more common in older adults. This condition can be caused by a number of things, including medicines, dehydration, or illness. Follow these instructions at home: Taking these steps may help with your condition: Eating and drinking   Drink enough fluid to keep your urine clear or pale yellow. This helps to keep you from becoming dehydrated. Try to drink more clear fluids, such as water.  Do not drink alcohol.  Limit your caffeine intake if directed by your health care provider.  Limit your salt intake if directed by your health care provider. Activity   Avoid making quick movements.  Rise slowly from chairs and steady yourself until you feel okay.  In the morning, first sit up on the side of the bed. When you feel okay, stand slowly while you hold onto something until you know that your balance is fine.  Move your legs often if you need to stand in one place for a long time. Tighten and relax your muscles in your legs while you are standing.  Do not drive or operate heavy machinery if you feel dizzy.  Avoid bending down if you feel dizzy. Place items in your home so that they are easy for you to reach without leaning over. Lifestyle   Do not use any tobacco products, including cigarettes, chewing tobacco, or electronic cigarettes. If you need help quitting, ask your health care provider.  Try to reduce your stress level, such as with yoga or meditation. Talk with your health care provider if you need help. General instructions   Watch your dizziness for any changes.  Take medicines only as directed by your health care provider. Talk with your health care provider if you think that your dizziness is caused by a medicine that you are taking.  Tell a friend  or a family member that you are feeling dizzy. If he or she notices any changes in your behavior, have this person call your health care provider.  Keep all follow-up visits as directed by your health care provider. This is important. Contact a health care provider if:  Your dizziness does not go away.  Your dizziness or light-headedness gets worse.  You feel nauseous.  You have reduced hearing.  You have new symptoms.  You are unsteady on your feet or you feel like the room is spinning. Get help right away if:  You vomit or have diarrhea and are unable to eat or drink anything.  You have problems talking, walking, swallowing, or using your arms, hands, or legs.  You feel generally weak.  You are not thinking clearly or you have trouble forming sentences. It may take a friend or family member to notice this.  You have chest pain, abdominal pain, shortness of breath, or sweating.  Your vision changes.  You notice any bleeding.  You have a headache.  You have neck pain or a stiff neck.  You have a fever. This information is not intended to replace advice given to you by your health care provider. Make sure you discuss any questions you have with your health care provider. Document Released: 06/05/2001 Document Revised: 05/17/2016 Document Reviewed: 12/06/2014 Elsevier Interactive Patient Education  2017 [employer]  

## 2016-11-26 NOTE — Assessment & Plan Note (Signed)
None during travels

## 2016-11-26 NOTE — Assessment & Plan Note (Signed)
Recheck level today 

## 2016-11-26 NOTE — Assessment & Plan Note (Signed)
Avoid offending foods, start probiotics. Do not eat large meals in late evening and consider raising head of bed. Doing well on Pantoprazole 

## 2016-11-26 NOTE — Assessment & Plan Note (Addendum)
Was [place] country [place] and suffered symptoms during entire stay of vertigo most notable with rapid movements. Then she describes some episodes of her right eye lid drooping which was transient. Refer to neurology for further consideration

## 2016-11-28 LAB — ZINC: ZINC: 79 ug/dL (ref 60–130)

## 2016-12-25 DIAGNOSIS — M5136 Other intervertebral disc degeneration, lumbar region: Secondary | ICD-10-CM | POA: Diagnosis not present

## 2016-12-25 DIAGNOSIS — M5126 Other intervertebral disc displacement, lumbar region: Secondary | ICD-10-CM | POA: Diagnosis not present

## 2017-01-14 ENCOUNTER — Encounter: Payer: Self-pay | Admitting: Internal Medicine

## 2017-01-14 ENCOUNTER — Ambulatory Visit (INDEPENDENT_AMBULATORY_CARE_PROVIDER_SITE_OTHER): Payer: Medicare Other | Admitting: Internal Medicine

## 2017-01-14 ENCOUNTER — Encounter (INDEPENDENT_AMBULATORY_CARE_PROVIDER_SITE_OTHER): Payer: Self-pay

## 2017-01-14 VITALS — BP 136/74 | HR 72 | Ht 64.0 in | Wt 201.4 lb

## 2017-01-14 DIAGNOSIS — I999 Unspecified disorder of circulatory system: Secondary | ICD-10-CM

## 2017-01-14 DIAGNOSIS — I739 Peripheral vascular disease, unspecified: Secondary | ICD-10-CM | POA: Diagnosis not present

## 2017-01-14 NOTE — Patient Instructions (Signed)
Your physician recommends that you continue on your current medications as directed. Please refer to the Current Medication list given to you today.  Your physician has requested that you have a carotid duplex. This test is an ultrasound of the carotid arteries in your neck. It looks at blood flow through these arteries that supply the brain with blood. Allow one hour for this exam. There are no restrictions or special instructions.  Your physician has requested that you have a lower extremity arterial duplex. This test is an ultrasound of the arteries in the legs. It looks at arterial blood flow in the legs. Allow one hour for Lower Arterial scans. There are no restrictions or special instructions.  You have been referred to Dr. Bing Plume, opthalmology for vision changes.  Please call our office or Dr. Rachael Fee office directly, if you do not hear from them in the next 7-10 days.

## 2017-01-14 NOTE — Progress Notes (Signed)
Cardiology Office Note   Date:  01/14/2017   ID:  [name], DOB February 23, 1946, MRN [id number]  PCP:  Penni Homans, MD  Cardiologist:   Dorris Carnes, MD    F/U of CAD     History of Present Illness: [name] is a 71 y.o. female with a history of CAD - s/p NSTEMI 10/2011 with DESx2 to RCA and normal EF. She underwent repeat LHC on 07/17/12 Patent RCA stent LCA with collaterals to R acute marginal were unchanged. Subtotal occlusion of acute marginal (exits mid stent). Recomm for medical Rx at that time. Other issues include thyroidectomy, GERD, HTN, HLD, vertigo.Last myoviue showed inferolateral scar with minimal periinfarct ischemia  I saw hier in Jan 2017 She was seen in ER with chest tightness in March  Not felt to be cardiac  Seen in GI  EGD  SHowed esophagitis    Pt says breathing OK   Occasional pinching in chest Fleeting    Visual loss R eye  Transient  Aossoc withspasm of muscles around eye  COmes and goes  Can last all day   Discomfort in calves  SOme wit actvity  SOme cramping at night    Current Meds  Medication Sig  . amLODipine (NORVASC) 10 MG tablet Take 1 tablet (10 mg total) by mouth daily.  Marland Kitchen aspirin EC 81 MG tablet Take 81 mg by mouth daily.    . B Complex-C (B-COMPLEX WITH VITAMIN C) tablet Take 1 tablet by mouth daily.  . calcitRIOL (ROCALTROL) 0.25 MCG capsule TAKE 3 CAPSULES(0.75 MCG) BY MOUTH DAILY  . calcium carbonate (TUMS - DOSED IN MG ELEMENTAL CALCIUM) 500 MG chewable tablet Chew 4 tablets by mouth 2 (two) times daily.   . cholecalciferol (VITAMIN D) 1000 UNITS tablet Take 1,000 Units by mouth daily.  . clopidogrel (PLAVIX) 75 MG tablet Take 1 tablet (75 mg total) by mouth daily.  . famotidine (PEPCID) 20 MG tablet Take 1 tablet by mouth two  times daily  . gabapentin (NEURONTIN) 100 MG capsule Take 3 capsules (300 mg total) by mouth 3 (three) times daily.  . isosorbide mononitrate (IMDUR) 60 MG 24 hr tablet TAKE 1 AND 1/2 TABLETS(90 MG)  BY MOUTH DAILY  . levothyroxine (SYNTHROID, LEVOTHROID) 100 MCG tablet TAKE 1 TABLET(100 MCG) BY MOUTH DAILY  . Menthol, Topical Analgesic, (ICY HOT EX) Apply 1 patch topically daily.  . methocarbamol (ROBAXIN) 500 MG tablet Take 2 tablets (1,000 mg total) by mouth every 8 (eight) hours as needed (shoulder pain).  . nitroGLYCERIN (NITROSTAT) 0.4 MG SL tablet Dissolve 1 tablet under the tongue every 5 minutes as   needed for chest pain as   directed  . pantoprazole (PROTONIX) 40 MG tablet Take 1 tablet (40 mg total) by mouth daily.  . rosuvastatin (CRESTOR) 40 MG tablet TAKE 1 TABLET(40 MG) BY MOUTH DAILY     Allergies:   Zetia [ezetimibe]   Past Medical History:  Diagnosis Date  . Back pain 05/31/2013  . Benign paroxysmal positional vertigo 08/05/2016  . CAD (coronary artery disease)    a. NSTEMI 10/2011: [place] 11/19/11: pLAD 30%, oOM 60%, AVCFX 30%, CFX after OM2 30%, pRCA 60 and 70%, then 99%, AM 80-90% with TIMI 3 flow.  PCI: Promus DES x 2 to RCA; b. 06/2012 Cath: patent RCA stents w/ subtl occl of Acute Marginal (jailed)->Med rx; c. 05/2015 MV: EF 59%, mod mid infsept/inf/ap lat/ap infarct with peri-infarct isch-->Med Rx; d. 02/2016 Cath: LM nl, LAD 3m,  RI 50, RCA patent stents.  . Colitis   . GERD (gastroesophageal reflux disease)    occasional  . Hyperlipidemia   . Hypertensive heart disease    a. Echocardiogram 11/19/11: Difficult acoustic windows, EF 60-65%, normal LV wall thickness, grade 1 diastolic dysfunction  . Hypoparathyroidism ([place])   . Low zinc level 11/26/2016  . Multinodular thyroid    Goiter s/p thyroidectomy in 2007 with post-op  hypocalcemia and post-op hypothyroidism/hypoparathyroidism with hypocalcemia  . Neck pain on right side 07/13/2013  . Nocturia 05/31/2013  . Pain in right axilla 03/13/2016  . Palpitations    a. 03/2012 - patient set up for event monitor but did not wear correctly -  she declined wearing a repeat monitor  . Post-surgical hypothyroidism   .  Sun-damaged skin 12/05/2014  . Tubular adenoma of colon 06/2011  . Unspecified constipation 05/31/2013  . Vertigo   . Zinc deficiency 11/26/2015    Past Surgical History:  Procedure Laterality Date  . CARDIAC CATHETERIZATION N/A 03/08/2016   Procedure: Left Heart Cath and Coronary Angiography;  Surgeon: Jettie Booze, MD;  Location: [place] [facility];  Service: Cardiovascular;  Laterality: N/A;  . COLONOSCOPY  Aenomatous polyps   07/05/2011  . COLONOSCOPY N/A 09/28/2014   Procedure: COLONOSCOPY;  Surgeon: Ladene Artist, MD;  Location: [facility];  Service: Endoscopy;  Laterality: N/A;  . CORONARY ANGIOPLASTY WITH STENT PLACEMENT    . LEFT HEART CATHETERIZATION WITH CORONARY ANGIOGRAM N/A 07/17/2012   Procedure: LEFT HEART CATHETERIZATION WITH CORONARY ANGIOGRAM;  Surgeon: Hillary Bow, MD;  Location: [facility];  Service: Cardiovascular;  Laterality: N/A;  . PERCUTANEOUS CORONARY STENT INTERVENTION (PCI-S) N/A 11/19/2011   Procedure: PERCUTANEOUS CORONARY STENT INTERVENTION (PCI-S);  Surgeon: Hillary Bow, MD;  Location[facility];  Service: Cardiovascular;  Laterality: N/A;  . SHOULDER ARTHROSCOPY W/ ROTATOR CUFF REPAIR     right  . TOTAL THYROIDECTOMY  2007   GOITER     Social History:  The patient  reports that she has never smoked. She has never used smokeless tobacco. She reports that she does not drink alcohol or use drugs.   Family History:  The patient's family history includes Blindness in her sister; Cancer in her brother and brother; Colon cancer in her brother; Congestive Heart Failure in her sister; Heart attack in her father; Heart disease in her father; Hypertension in her father and sister; Thyroid disease in her sister.    ROS:  Please see the history of present illness. All other systems are reviewed and  Negative to the above problem except as noted.    PHYSICAL EXAM: VS:  BP 136/74   Pulse 72   Ht 5\' 4"  (1.626 m)   Wt 201 lb 6.4 oz (91.4 kg)    SpO2 96%   BMI 34.57 kg/m   GEN: Well nourished, well developed, in no acute distress  HEENT: normal  Neck: no JVD, carotid bruits, or masses Cardiac: RRR; no murmurs, rubs, or gallops,no edema     Tr PT pulses   Respiratory:  clear to auscultation bilaterally, normal work of breathing GI: soft, nontender, nondistended, + BS  No hepatomegaly  MS: no deformity Moving all extremities   Skin: warm and dry, no rash Neuro:  Strength and sensation are intact Psych: euthymic mood, full affect   EKG:  EKG is not ordered today.   Lipid Panel    Component Value Date/Time   CHOL 180 11/26/2016 1111   TRIG  167.0 (H) 11/26/2016 1111   HDL 47.80 11/26/2016 1111   CHOLHDL 4 11/26/2016 1111   VLDL 33.4 11/26/2016 1111   LDLCALC 99 11/26/2016 1111   LDLDIRECT 83.8 08/11/2012 1203      Wt Readings from Last 3 Encounters:  01/14/17 201 lb 6.4 oz (91.4 kg)  11/26/16 199 lb 3.2 oz (90.4 kg)  07/26/16 196 lb 6.4 oz (89.1 kg)      ASSESSMENT AND PLAN:  1  CAD  Patient without angina  I am not convinced that transient spells represent angina  Follow   2  Hx GERD  On Protonix   3  HL  Will discuss with lipid clnic  ? Repatha vs study drug  4  Ophthy   Pt with visual changes in REye   Has been seen by Dr Bing Plume in past  Will refer back  5  CV dz  F/U carotid USN   6  Claudication  Set up for LE arterial dopplers     Current medicines are reviewed at length with the patient today.  The patient does not have concerns regarding medicines.  Signed, Dorris Carnes, MD  01/14/2017 8:44 AM    [address], [place], [place]  [id number] Phone: [phone]; Fax: [phone]

## 2017-01-15 DIAGNOSIS — M5136 Other intervertebral disc degeneration, lumbar region: Secondary | ICD-10-CM | POA: Diagnosis not present

## 2017-01-25 ENCOUNTER — Other Ambulatory Visit: Payer: Self-pay | Admitting: Internal Medicine

## 2017-01-25 DIAGNOSIS — I6523 Occlusion and stenosis of bilateral carotid arteries: Secondary | ICD-10-CM

## 2017-01-25 DIAGNOSIS — I739 Peripheral vascular disease, unspecified: Secondary | ICD-10-CM

## 2017-01-28 ENCOUNTER — Ambulatory Visit (HOSPITAL_COMMUNITY)
Admission: RE | Admit: 2017-01-28 | Discharge: 2017-01-28 | Disposition: A | Discharge status: Home / Self Care | Payer: Medicare Other | Source: Ambulatory Visit | Attending: Cardiology | Admitting: Cardiology

## 2017-01-28 ENCOUNTER — Ambulatory Visit (INDEPENDENT_AMBULATORY_CARE_PROVIDER_SITE_OTHER): Payer: Medicare Other | Admitting: Family Medicine

## 2017-01-28 ENCOUNTER — Encounter: Payer: Self-pay | Admitting: Family Medicine

## 2017-01-28 DIAGNOSIS — E785 Hyperlipidemia, unspecified: Secondary | ICD-10-CM | POA: Diagnosis not present

## 2017-01-28 DIAGNOSIS — L989 Disorder of the skin and subcutaneous tissue, unspecified: Secondary | ICD-10-CM

## 2017-01-28 DIAGNOSIS — I999 Unspecified disorder of circulatory system: Secondary | ICD-10-CM | POA: Insufficient documentation

## 2017-01-28 DIAGNOSIS — I2510 Atherosclerotic heart disease of native coronary artery without angina pectoris: Secondary | ICD-10-CM | POA: Diagnosis not present

## 2017-01-28 DIAGNOSIS — I739 Peripheral vascular disease, unspecified: Secondary | ICD-10-CM | POA: Diagnosis not present

## 2017-01-28 DIAGNOSIS — I10 Essential (primary) hypertension: Secondary | ICD-10-CM | POA: Insufficient documentation

## 2017-01-28 DIAGNOSIS — K2100 Gastro-esophageal reflux disease with esophagitis, without bleeding: Secondary | ICD-10-CM

## 2017-01-28 DIAGNOSIS — E039 Hypothyroidism, unspecified: Secondary | ICD-10-CM

## 2017-01-28 DIAGNOSIS — E60 Dietary zinc deficiency: Secondary | ICD-10-CM

## 2017-01-28 DIAGNOSIS — I6523 Occlusion and stenosis of bilateral carotid arteries: Secondary | ICD-10-CM | POA: Diagnosis not present

## 2017-01-28 DIAGNOSIS — I708 Atherosclerosis of other arteries: Secondary | ICD-10-CM | POA: Insufficient documentation

## 2017-01-28 HISTORY — DX: Disorder of the skin and subcutaneous tissue, unspecified: L98.9

## 2017-01-28 NOTE — Patient Instructions (Signed)
Flaxseed oil caps daily Cholesterol Cholesterol is a white, waxy, fat-like substance that is needed by the human body in small amounts. The liver makes all the cholesterol we need. Cholesterol is carried from the liver by the blood through the blood vessels. Deposits of cholesterol (plaques) may build up on blood vessel (artery) walls. Plaques make the arteries narrower and stiffer. Cholesterol plaques increase the risk for heart attack and stroke. You cannot feel your cholesterol level even if it is very high. The only way to know that it is high is to have a blood test. Once you know your cholesterol levels, you should keep a record of the test results. Work with your health care provider to keep your levels in the desired range. What do the results mean?  Total cholesterol is a rough measure of all the cholesterol in your blood.  LDL (low-density lipoprotein) is the "bad" cholesterol. This is the type that causes plaque to build up on the artery walls. You want this level to be low.  HDL (high-density lipoprotein) is the "good" cholesterol because it cleans the arteries and carries the LDL away. You want this level to be high.  Triglycerides are fat that the body can either burn for energy or store. High levels are closely linked to heart disease. What are the desired levels of cholesterol?  Total cholesterol below 200.  LDL below 100 for people who are at risk, below 70 for people at very high risk.  HDL above 40 is good. A level of 60 or higher is considered to be protective against heart disease.  Triglycerides below 150. How can I lower my cholesterol? Diet  Follow your diet program as told by your health care provider.  Choose fish or white meat chicken and [place], roasted or baked. Limit fatty cuts of red meat, fried foods, and processed meats, such as sausage and lunch meats.  Eat lots of fresh fruits and vegetables.  Choose whole grains, beans, pasta, potatoes, and  cereals.  Choose olive oil, corn oil, or canola oil, and use only small amounts.  Avoid butter, mayonnaise, shortening, or palm kernel oils.  Avoid foods with trans fats.  Drink skim or nonfat milk and eat low-fat or nonfat yogurt and cheeses. Avoid whole milk, cream, ice cream, egg yolks, and full-fat cheeses.  Healthier desserts include angel food cake, ginger snaps, animal crackers, hard candy, popsicles, and low-fat or nonfat frozen yogurt. Avoid pastries, cakes, pies, and cookies. Exercise  Follow your exercise program as told by your health care provider. A regular program:  Helps to decrease LDL and raise HDL.  Helps with weight control.  Do things that increase your activity level, such as gardening, walking, and taking the stairs.  Ask your health care provider about ways that you can be more active in your daily life. Medicine  Take over-the-counter and prescription medicines only as told by your health care provider.  Medicine may be prescribed by your health care provider to help lower cholesterol and decrease the risk for heart disease. This is usually done if diet and exercise have failed to bring down cholesterol levels.  If you have several risk factors, you may need medicine even if your levels are normal. This information is not intended to replace advice given to you by your health care provider. Make sure you discuss any questions you have with your health care provider. Document Released: 09/04/2001 Document Revised: 07/07/2016 Document Reviewed: 06/09/2016 Elsevier Interactive Patient Education  2017 [employer].

## 2017-01-28 NOTE — Assessment & Plan Note (Signed)
Repeat cmp in 3 months.

## 2017-01-28 NOTE — Assessment & Plan Note (Signed)
Well controlled, no changes to meds. Encouraged heart healthy diet such as the DASH diet and exercise as tolerated.  °

## 2017-01-28 NOTE — Assessment & Plan Note (Addendum)
Avoid offending foods, start probiotics. Do not eat large meals in late evening and consider raising head of bed. Doing well on Pantoprazole and avoids eating after 6 pm as a result her sore throat, choking dyspepsia and chest pain have all improved considerably

## 2017-01-28 NOTE — Progress Notes (Signed)
Pre visit review using our clinic review tool, if applicable. No additional management support is needed unless otherwise documented below in the visit note. 

## 2017-01-28 NOTE — Progress Notes (Signed)
Patient ID: CHUMY MOMENT, female   DOB: [birth year], 71 y.o.   MRN: [id number]   Subjective:    Patient ID: [name], female    DOB: 1946-02-16, 71 y.o.   MRN: [id number]  Chief Complaint  Patient presents with  . Follow-up  . Gastroesophageal Reflux  . Hyperlipidemia  . Hypertension  I acted as a Education administrator for Dr. Charlett Blake. Princess, RMA   Gastroesophageal Reflux  She complains of belching, chest pain, choking, coughing, heartburn, a hoarse voice and a sore throat. She reports no abdominal pain, no nausea or no wheezing. This is a chronic problem. The current episode started more than 1 year ago. The problem occurs occasionally. The problem has been gradually improving. The heartburn does not wake her from sleep. The heartburn does not limit her activity. The heartburn doesn't change with position. The symptoms are aggravated by certain foods. Pertinent negatives include no weight loss. The treatment provided significant relief.  Hyperlipidemia  This is a chronic problem. The current episode started more than 1 year ago. The problem is controlled. Associated symptoms include chest pain. Pertinent negatives include no shortness of breath.  Hypertension  This is a chronic problem. The current episode started more than 1 year ago. The problem is unchanged. The problem is controlled. Associated symptoms include chest pain. Pertinent negatives include no shortness of breath.    Patient is in today for follow up. Her heartburn improved with lifestyle changes most notably eating earlier and avoiding fatty foods. As a result her choking, sore throat and chest pain have also improved.   Past Medical History:  Diagnosis Date  . Back pain 05/31/2013  . Benign paroxysmal positional vertigo 08/05/2016  . CAD (coronary artery disease)    a. NSTEMI 10/2011: [place] 11/19/11: pLAD 30%, oOM 60%, AVCFX 30%, CFX after OM2 30%, pRCA 60 and 70%, then 99%, AM 80-90% with TIMI 3 flow.  PCI: Promus DES x 2 to RCA;  b. 06/2012 Cath: patent RCA stents w/ subtl occl of Acute Marginal (jailed)->Med rx; c. 05/2015 MV: EF 59%, mod mid infsept/inf/ap lat/ap infarct with peri-infarct isch-->Med Rx; d. 02/2016 Cath: LM nl, LAD 24m, RI 50, RCA patent stents.  . Colitis   . Facial skin lesion 01/28/2017  . GERD (gastroesophageal reflux disease)    occasional  . Hyperlipidemia   . Hypertensive heart disease    a. Echocardiogram 11/19/11: Difficult acoustic windows, EF 60-65%, normal LV wall thickness, grade 1 diastolic dysfunction  . Hypoparathyroidism ([place])   . Low zinc level 11/26/2016  . Multinodular thyroid    Goiter s/p thyroidectomy in 2007 with post-op  hypocalcemia and post-op hypothyroidism/hypoparathyroidism with hypocalcemia  . Neck pain on right side 07/13/2013  . Nocturia 05/31/2013  . Pain in right axilla 03/13/2016  . Palpitations    a. 03/2012 - patient set up for event monitor but did not wear correctly -  she declined wearing a repeat monitor  . Post-surgical hypothyroidism   . Sun-damaged skin 12/05/2014  . Tubular adenoma of colon 06/2011  . Unspecified constipation 05/31/2013  . Vertigo   . Zinc deficiency 11/26/2015    Past Surgical History:  Procedure Laterality Date  . CARDIAC CATHETERIZATION N/A 03/08/2016   Procedure: Left Heart Cath and Coronary Angiography;  Surgeon: Jettie Booze, MD;  Location: [facility];  Service: Cardiovascular;  Laterality: N/A;  . COLONOSCOPY  Aenomatous polyps   07/05/2011  . COLONOSCOPY N/A 09/28/2014   Procedure: COLONOSCOPY;  Surgeon: Ladene Artist,  MD;  Location: [facility];  Service: Endoscopy;  Laterality: N/A;  . CORONARY ANGIOPLASTY WITH STENT PLACEMENT    . LEFT HEART CATHETERIZATION WITH CORONARY ANGIOGRAM N/A 07/17/2012   Procedure: LEFT HEART CATHETERIZATION WITH CORONARY ANGIOGRAM;  Surgeon: Hillary Bow, MD;  Location: [facility] CATH LAB;  Service: Cardiovascular;  Laterality: N/A;  . PERCUTANEOUS CORONARY STENT INTERVENTION (PCI-S) N/A  11/19/2011   Procedure: PERCUTANEOUS CORONARY STENT INTERVENTION (PCI-S);  Surgeon: Hillary Bow, MD;  Location: [facility] CATH LAB;  Service: Cardiovascular;  Laterality: N/A;  . SHOULDER ARTHROSCOPY W/ ROTATOR CUFF REPAIR     right  . TOTAL THYROIDECTOMY  2007   GOITER    Family History  Problem Relation Age of Onset  . Colon cancer Brother   . Cancer Brother     COLON  . Hypertension Father   . Heart disease Father   . Heart attack Father   . Blindness Sister   . Congestive Heart Failure Sister   . Hypertension Sister   . Thyroid disease Sister   . Cancer Brother     multiple myelomas  . Diabetes Neg Hx   . Prostate cancer Neg Hx   . Breast cancer Neg Hx     Social History   Social History  . Marital status: Married    Spouse name: N/A  . Number of children: 4  . Years of education: 14   Occupational History  . Sales person at [employer]  . Smoking status: Never Smoker  . Smokeless tobacco: Never Used  . Alcohol use No  . Drug use: No  . Sexual activity: Not on file   Other Topics Concern  . Not on file   Social History Narrative   Lives at home alone.  Her son lives near her.   Right-handed.   1 cup coffee per day.    Outpatient Medications Prior to Visit  Medication Sig Dispense Refill  . amLODipine (NORVASC) 10 MG tablet Take 1 tablet (10 mg total) by mouth daily. 90 tablet 3  . aspirin EC 81 MG tablet Take 81 mg by mouth daily.      . B Complex-C (B-COMPLEX WITH VITAMIN C) tablet Take 1 tablet by mouth daily.    . calcitRIOL (ROCALTROL) 0.25 MCG capsule TAKE 3 CAPSULES(0.75 MCG) BY MOUTH DAILY 270 capsule 0  . calcium carbonate (TUMS - DOSED IN MG ELEMENTAL CALCIUM) 500 MG chewable tablet Chew 4 tablets by mouth 2 (two) times daily.     . cholecalciferol (VITAMIN D) 1000 UNITS tablet Take 1,000 Units by mouth daily.    . clopidogrel (PLAVIX) 75 MG tablet Take 1 tablet (75 mg total) by mouth daily. 90 tablet 3  . famotidine  (PEPCID) 20 MG tablet Take 1 tablet by mouth two  times daily 180 tablet 1  . gabapentin (NEURONTIN) 100 MG capsule Take 3 capsules (300 mg total) by mouth 3 (three) times daily. 270 capsule 11  . isosorbide mononitrate (IMDUR) 60 MG 24 hr tablet TAKE 1 AND 1/2 TABLETS(90 MG) BY MOUTH DAILY 135 tablet 0  . levothyroxine (SYNTHROID, LEVOTHROID) 100 MCG tablet TAKE 1 TABLET(100 MCG) BY MOUTH DAILY 90 tablet 0  . Menthol, Topical Analgesic, (ICY HOT EX) Apply 1 patch topically daily.    . methocarbamol (ROBAXIN) 500 MG tablet Take 2 tablets (1,000 mg total) by mouth every 8 (eight) hours as needed (shoulder pain). 30 tablet 0  . nitroGLYCERIN (NITROSTAT) 0.4 MG SL tablet  Dissolve 1 tablet under the tongue every 5 minutes as   needed for chest pain as   directed 25 tablet 2  . pantoprazole (PROTONIX) 40 MG tablet Take 1 tablet (40 mg total) by mouth daily. 90 tablet 3  . rosuvastatin (CRESTOR) 40 MG tablet TAKE 1 TABLET(40 MG) BY MOUTH DAILY 90 tablet 0  . iohexol (OMNIPAQUE) 350 MG/ML injection 100 mL      No facility-administered medications prior to visit.     Allergies  Allergen Reactions  . Zetia [Ezetimibe] Other (See Comments)    Myalgia, paresthesias and weakness    Review of Systems  Constitutional: Negative for weight loss.  HENT: Positive for hoarse voice and sore throat.   Respiratory: Positive for cough and choking. Negative for shortness of breath and wheezing.   Cardiovascular: Positive for chest pain.  Gastrointestinal: Positive for heartburn. Negative for abdominal pain and nausea.       Objective:    Physical Exam  Constitutional: She is oriented to person, place, and time. She appears well-developed and well-nourished. No distress.  HENT:  Head: Normocephalic and atraumatic.  Nose: Nose normal.  Mouth/Throat: Oropharynx is clear and moist. No oropharyngeal exudate.  Eyes: Conjunctivae are normal. Pupils are equal, round, and reactive to light. Right eye exhibits no  discharge. Left eye exhibits no discharge.  Neck: Normal range of motion. Neck supple.  Cardiovascular: Normal rate and regular rhythm.   No murmur heard. Pulmonary/Chest: Effort normal and breath sounds normal.  Abdominal: Soft. Bowel sounds are normal. There is no tenderness. There is no rebound.  Musculoskeletal: She exhibits no edema.  Neurological: She is alert and oriented to person, place, and time.  Skin: Skin is warm and dry.  Psychiatric: She has a normal mood and affect.  Nursing note and vitals reviewed.   BP 130/76 (BP Location: Left Arm, Patient Position: Sitting, Cuff Size: Normal)   Pulse 67   Temp 98.9 F (37.2 C) (Oral)   Wt 201 lb 6.4 oz (91.4 kg)   SpO2 97%   BMI 34.57 kg/m  Wt Readings from Last 3 Encounters:  01/28/17 201 lb 6.4 oz (91.4 kg)  01/14/17 201 lb 6.4 oz (91.4 kg)  11/26/16 199 lb 3.2 oz (90.4 kg)     Lab Results  Component Value Date   WBC 8.1 11/26/2016   HGB 14.4 11/26/2016   HCT 43.2 11/26/2016   PLT 275.0 11/26/2016   GLUCOSE 102 (H) 11/26/2016   CHOL 180 11/26/2016   TRIG 167.0 (H) 11/26/2016   HDL 47.80 11/26/2016   LDLDIRECT 83.8 08/11/2012   LDLCALC 99 11/26/2016   ALT 18 11/26/2016   AST 18 11/26/2016   NA 143 11/26/2016   K 4.2 11/26/2016   CL 102 11/26/2016   CREATININE 1.14 11/26/2016   BUN 22 11/26/2016   CO2 31 11/26/2016   TSH 1.51 11/26/2016   INR 0.86 03/07/2016   HGBA1C 5.9 05/07/2016    Lab Results  Component Value Date   TSH 1.51 11/26/2016   Lab Results  Component Value Date   WBC 8.1 11/26/2016   HGB 14.4 11/26/2016   HCT 43.2 11/26/2016   MCV 83.1 11/26/2016   PLT 275.0 11/26/2016   Lab Results  Component Value Date   NA 143 11/26/2016   K 4.2 11/26/2016   CO2 31 11/26/2016   GLUCOSE 102 (H) 11/26/2016   BUN 22 11/26/2016   CREATININE 1.14 11/26/2016   BILITOT 0.5 11/26/2016   ALKPHOS 89 11/26/2016  AST 18 11/26/2016   ALT 18 11/26/2016   PROT 7.9 11/26/2016   ALBUMIN 4.2 11/26/2016    CALCIUM 9.6 11/26/2016   ANIONGAP 9 06/28/2016   GFR 50.12 (L) 11/26/2016   Lab Results  Component Value Date   CHOL 180 11/26/2016   Lab Results  Component Value Date   HDL 47.80 11/26/2016   Lab Results  Component Value Date   LDLCALC 99 11/26/2016   Lab Results  Component Value Date   TRIG 167.0 (H) 11/26/2016   Lab Results  Component Value Date   CHOLHDL 4 11/26/2016   Lab Results  Component Value Date   HGBA1C 5.9 05/07/2016       Assessment & Plan:   Problem List Items Addressed This Visit    Hypothyroidism    On Levothyroxine, continue to monitor      Relevant Orders   TSH   Hyperlipidemia    Encouraged heart healthy diet, increase exercise, avoid trans fats, consider a krill oil cap daily      Relevant Orders   Lipid panel   Essential hypertension, benign    Well controlled, no changes to meds. Encouraged heart healthy diet such as the DASH diet and exercise as tolerated.       Relevant Orders   CBC   Comprehensive metabolic panel   Zinc   Hypocalcemia    Repeat cmp in 3 months.      Zinc deficiency    Currently taking 50 mg po 3 x weekly will check level at next visit      Esophageal reflux    Avoid offending foods, start probiotics. Do not eat large meals in late evening and consider raising head of bed. Doing well on Pantoprazole and avoids eating after 6 pm as a result her sore throat, choking dyspepsia and chest pain have all improved considerably      Relevant Orders   CBC   Comprehensive metabolic panel   Facial skin lesion   Relevant Orders   Ambulatory referral to Dermatology      I am having [name] maintain her calcium carbonate, aspirin EC, B-complex with vitamin C, cholecalciferol, (Menthol, Topical Analgesic, (ICY HOT EX)), gabapentin, nitroGLYCERIN, methocarbamol, amLODipine, clopidogrel, famotidine, rosuvastatin, calcitRIOL, isosorbide mononitrate, levothyroxine, and pantoprazole. We will stop administering  iohexol.  No orders of the defined types were placed in this encounter.   CMA served as Education administrator during this visit. History, Physical and Plan performed by medical provider. Documentation and orders reviewed and attested to.  Penni Homans, MD

## 2017-01-28 NOTE — Assessment & Plan Note (Signed)
Encouraged heart healthy diet, increase exercise, avoid trans fats, consider a krill oil cap daily 

## 2017-01-28 NOTE — Assessment & Plan Note (Signed)
On Levothyroxine, continue to monitor 

## 2017-01-28 NOTE — Assessment & Plan Note (Signed)
Currently taking 50 mg po 3 x weekly will check level at next visit

## 2017-01-31 ENCOUNTER — Telehealth: Payer: Self-pay | Admitting: Internal Medicine

## 2017-01-31 NOTE — Telephone Encounter (Signed)
New Message ° °Pt call requesting to speak with RN about test results. Please call back to discuss  °

## 2017-01-31 NOTE — Telephone Encounter (Signed)
Spoke with patient and informed of carotid doppler results.  Pt also had LEA doppler and she states they looked at her stomach too and she is waiting to hear these results.  Advised her LEA doppler has not been reviewed by Dr. Harrington Challenger and once it is we will call her with results.

## 2017-01-31 NOTE — Telephone Encounter (Signed)
Follow up ° ° °Pt is returning call to nurse.  °

## 2017-02-01 NOTE — Telephone Encounter (Signed)
Follow up ° ° ° ° ° °Calling to get test results °

## 2017-02-01 NOTE — Telephone Encounter (Signed)
Advised her LEA doppler has not been reviewed by Dr. Harrington Challenger and once it is we will call her with results. Patient verbalized understanding.

## 2017-02-12 ENCOUNTER — Other Ambulatory Visit: Payer: Self-pay | Admitting: Family Medicine

## 2017-02-12 DIAGNOSIS — E892 Postprocedural hypoparathyroidism: Secondary | ICD-10-CM

## 2017-02-13 NOTE — Telephone Encounter (Signed)
Informed patient.  She is aware of appointment with Dr. Gwenlyn Found at [facility] location on 02/20/17.  Appreciative for the information provided.

## 2017-02-17 ENCOUNTER — Other Ambulatory Visit: Payer: Self-pay | Admitting: Family Medicine

## 2017-02-17 DIAGNOSIS — Z0000 Encounter for general adult medical examination without abnormal findings: Secondary | ICD-10-CM

## 2017-02-17 DIAGNOSIS — E60 Dietary zinc deficiency: Secondary | ICD-10-CM

## 2017-02-17 DIAGNOSIS — R1013 Epigastric pain: Secondary | ICD-10-CM

## 2017-02-17 DIAGNOSIS — E892 Postprocedural hypoparathyroidism: Secondary | ICD-10-CM

## 2017-02-17 DIAGNOSIS — I10 Essential (primary) hypertension: Secondary | ICD-10-CM

## 2017-02-17 DIAGNOSIS — E876 Hypokalemia: Secondary | ICD-10-CM

## 2017-02-17 DIAGNOSIS — E039 Hypothyroidism, unspecified: Secondary | ICD-10-CM

## 2017-02-20 ENCOUNTER — Encounter: Payer: Self-pay | Admitting: Cardiovascular Disease

## 2017-02-20 ENCOUNTER — Ambulatory Visit (INDEPENDENT_AMBULATORY_CARE_PROVIDER_SITE_OTHER): Payer: Medicare Other | Admitting: Cardiovascular Disease

## 2017-02-20 VITALS — BP 138/66 | HR 78 | Ht 64.0 in | Wt 203.0 lb

## 2017-02-20 DIAGNOSIS — I10 Essential (primary) hypertension: Secondary | ICD-10-CM

## 2017-02-20 DIAGNOSIS — I739 Peripheral vascular disease, unspecified: Secondary | ICD-10-CM | POA: Diagnosis not present

## 2017-02-20 NOTE — Progress Notes (Signed)
02/20/2017 [name]   08-21-1946  PO:[id number]  Primary Physician Taylor Homans, MD Primary Cardiologist: Taylor Harp MD [name]  HPI:  [name] is a 71 year old mildly overweight [place] female referred to me by [name] for evaluation of peripheral arterial disease. She does have a history of CAD status post non-STEMI with stenting of her RCA with drug-eluting stent 10/2011. She has hypertension and hyperlipidemia. She does complain of some right lower extremity pain principally when she is lying flat at night, better when she is up and ambulating. Her symptoms sound more like pseudo-clot claudication related to sciatica/radicular pain. Her lower extremity arterial Doppler studies performed 01/28/17 revealed right ABI 0.82 and a left of 1.0. There is a moderate lesion in her right common iliac artery.   Current Outpatient Prescriptions  Medication Sig Dispense Refill  . amLODipine (NORVASC) 10 MG tablet Take 1 tablet (10 mg total) by mouth daily. 90 tablet 3  . aspirin EC 81 MG tablet Take 81 mg by mouth daily.      . B Complex-C (B-COMPLEX WITH VITAMIN C) tablet Take 1 tablet by mouth daily.    . calcitRIOL (ROCALTROL) 0.25 MCG capsule TAKE 3 CAPSULES(0.75 MCG) BY MOUTH DAILY 270 capsule 0  . calcium carbonate (TUMS - DOSED IN MG ELEMENTAL CALCIUM) 500 MG chewable tablet Chew 4 tablets by mouth 2 (two) times daily.     . cholecalciferol (VITAMIN D) 1000 UNITS tablet Take 1,000 Units by mouth daily.    . clopidogrel (PLAVIX) 75 MG tablet Take 1 tablet (75 mg total) by mouth daily. 90 tablet 3  . famotidine (PEPCID) 20 MG tablet Take 1 tablet by mouth two  times daily 180 tablet 1  . gabapentin (NEURONTIN) 100 MG capsule Take 3 capsules (300 mg total) by mouth 3 (three) times daily. 270 capsule 11  . isosorbide mononitrate (IMDUR) 60 MG 24 hr tablet TAKE 1 AND 1/2 TABLETS(90 MG) BY MOUTH DAILY 135 tablet 0  . levothyroxine (SYNTHROID, LEVOTHROID) 100 MCG  tablet TAKE 1 TABLET(100 MCG) BY MOUTH DAILY 90 tablet 0  . Menthol, Topical Analgesic, (ICY HOT EX) Apply 1 patch topically daily.    . methocarbamol (ROBAXIN) 500 MG tablet Take 2 tablets (1,000 mg total) by mouth every 8 (eight) hours as needed (shoulder pain). 30 tablet 0  . nitroGLYCERIN (NITROSTAT) 0.4 MG SL tablet Dissolve 1 tablet under the tongue every 5 minutes as   needed for chest pain as   directed 25 tablet 2  . pantoprazole (PROTONIX) 40 MG tablet Take 1 tablet (40 mg total) by mouth daily. 90 tablet 3  . rosuvastatin (CRESTOR) 40 MG tablet TAKE 1 TABLET(40 MG) BY MOUTH DAILY 90 tablet 0   No current facility-administered medications for this visit.     Allergies  Allergen Reactions  . Zetia [Ezetimibe] Other (See Comments)    Myalgia, paresthesias and weakness    Social History   Social History  . Marital status: Married    Spouse name: N/A  . Number of children: 4  . Years of education: 14   Occupational History  . Sales person at [employer]  . Smoking status: Never Smoker  . Smokeless tobacco: Never Used  . Alcohol use No  . Drug use: No  . Sexual activity: Not on file   Other Topics Concern  . Not on file   Social History Narrative   Lives at home  alone.  Her son lives near her.   Right-handed.   1 cup coffee per day.     Review of Systems: General: negative for chills, fever, night sweats or weight changes.  Cardiovascular: negative for chest pain, dyspnea on exertion, edema, orthopnea, palpitations, paroxysmal nocturnal dyspnea or shortness of breath Dermatological: negative for rash Respiratory: negative for cough or wheezing Urologic: negative for hematuria Abdominal: negative for nausea, vomiting, diarrhea, bright red blood per rectum, melena, or hematemesis Neurologic: negative for visual changes, syncope, or dizziness All other systems reviewed and are otherwise negative except as noted above.    Blood pressure  138/66, pulse 78, height 5\' 4"  (1.626 m), weight 203 lb (92.1 kg).  General appearance: alert and no distress Neck: no adenopathy, no carotid bruit, no JVD, supple, symmetrical, trachea midline and thyroid not enlarged, symmetric, no tenderness/mass/nodules Lungs: clear to auscultation bilaterally Heart: regular rate and rhythm, S1, S2 normal, no murmur, click, rub or gallop Extremities: extremities normal, atraumatic, no cyanosis or edema  EKG sinus rhythm at 78 without ST or T-wave changes. I personally reviewed this EKG.  ASSESSMENT AND PLAN:   Peripheral arterial disease ([place]) [name] was referred to me by [name] for evaluation of peripheral arterial disease. She has a history of coronary artery disease, treated hypertension and hyperlipidemia. She is not diabetic. She does complain of right leg pain particularly when she lies down at night and better when she gets up and walks which sounds like pseudo-claudication. Her Dopplers do show a moderate right common iliac artery stenosis with right ABI 0.8 to left ABI of 1. She's got up to plus right femoral and pedal pulses are there are no bruits. Do not think her symptoms are vascular nature but more likely neurologic from radicular pain/sciatica. We will continue to follow her Dopplers are annual basis. I will see her back when necessary.      Taylor Harp MD FACP,FACC,FAHA, [facility] 02/20/2017 2:26 PM

## 2017-02-20 NOTE — Assessment & Plan Note (Signed)
[name] was referred to me by Dr. Harrington Challenger for evaluation of peripheral arterial disease. She has a history of coronary artery disease, treated hypertension and hyperlipidemia. She is not diabetic. She does complain of right leg pain particularly when she lies down at night and better when she gets up and walks which sounds like pseudo-claudication. Her Dopplers do show a moderate right common iliac artery stenosis with right ABI 0.8 to left ABI of 1. She's got up to plus right femoral and pedal pulses are there are no bruits. Do not think her symptoms are vascular nature but more likely neurologic from radicular pain/sciatica. We will continue to follow her Dopplers are annual basis. I will see her back when necessary.

## 2017-02-20 NOTE — Patient Instructions (Signed)
Medication Instructions: Your physician recommends that you continue on your current medications as directed. Please refer to the Current Medication list given to you today.   Follow-Up: Your physician recommends that you schedule a follow-up appointment as needed with Dr. Berry.  If you need a refill on your cardiac medications before your next appointment, please call your pharmacy.  

## 2017-03-01 DIAGNOSIS — H538 Other visual disturbances: Secondary | ICD-10-CM | POA: Diagnosis not present

## 2017-03-01 DIAGNOSIS — G459 Transient cerebral ischemic attack, unspecified: Secondary | ICD-10-CM | POA: Diagnosis not present

## 2017-03-01 DIAGNOSIS — H02401 Unspecified ptosis of right eyelid: Secondary | ICD-10-CM | POA: Diagnosis not present

## 2017-03-04 ENCOUNTER — Telehealth: Payer: Self-pay | Admitting: Family Medicine

## 2017-03-04 NOTE — Telephone Encounter (Signed)
Caller name:[facility] Relationship to patient: Can be reached:fax [phone] Pharmacy:  Reason for call:Calling stating patient is on way for lab work from their office, patient does have order. Requesting lab work to be done here and have results faxed to them.

## 2017-03-04 NOTE — Telephone Encounter (Signed)
I am not aware of what labs they want. I will consider putting in orders but have to know what they are asking for so I can see if I can get insurance to agree.

## 2017-03-04 NOTE — Telephone Encounter (Signed)
Called [name] [phone] to inquire of request She was given an order for some lab work on Friday at their office and was to bring it here to have done. But as of yet has not come by to do lab.

## 2017-03-07 ENCOUNTER — Telehealth: Payer: Self-pay | Admitting: Internal Medicine

## 2017-03-07 ENCOUNTER — Other Ambulatory Visit: Payer: Self-pay

## 2017-03-07 DIAGNOSIS — H02409 Unspecified ptosis of unspecified eyelid: Secondary | ICD-10-CM

## 2017-03-07 DIAGNOSIS — E559 Vitamin D deficiency, unspecified: Secondary | ICD-10-CM

## 2017-03-07 DIAGNOSIS — H5710 Ocular pain, unspecified eye: Secondary | ICD-10-CM

## 2017-03-07 NOTE — Telephone Encounter (Signed)
New Message     Pt c/o BP issue: STAT if pt c/o blurred vision, one-sided weakness or slurred speech  1. What are your last 5 BP readings? 160/85  2. Are you having any other symptoms (ex. Dizziness, headache, blurred vision, passed out)? No, fast heart rate   3. What is your BP issue? On bp medication feels that is too high needs Dr Harrington Challenger to call immediately

## 2017-03-07 NOTE — Telephone Encounter (Signed)
Patient reports that yesterday her BP was 292K systolic and she felt like her heart was beating fast at that time. She feels fine today and her BP is 151/78.  She stated that occasionally it gets into the 160s and when that happens she takes an extra 1/2 (5mg ) of amlodipine at night and wanted to make sure that was ok.     I advised it is ok to take extra 1/2 tablet of amlodipine in the evening if BP is 462 or higher systolically.  I asked her to keep track of her BPs for about 10 days and drop off or call in readings to Dr. Harrington Challenger.  She is going to PCP tomorrow.  I asked her to take her cuff with her so they can compare it to a manual reading.  She is aware I am forwarding to Dr. Harrington Challenger for any other recommendations and will call her back.  Pt thanked me for the call back.

## 2017-03-07 NOTE — Telephone Encounter (Signed)
aAgree with plan/recommendations  Will review BP list when she brings them  Needs to take cuff to primary MD

## 2017-03-08 ENCOUNTER — Encounter: Payer: Self-pay | Admitting: Family Medicine

## 2017-03-08 ENCOUNTER — Other Ambulatory Visit (INDEPENDENT_AMBULATORY_CARE_PROVIDER_SITE_OTHER): Payer: Medicare Other

## 2017-03-08 ENCOUNTER — Ambulatory Visit (INDEPENDENT_AMBULATORY_CARE_PROVIDER_SITE_OTHER): Payer: Medicare Other | Admitting: Family Medicine

## 2017-03-08 VITALS — BP 136/60 | HR 74 | Temp 97.9°F | Ht 64.0 in | Wt 200.0 lb

## 2017-03-08 DIAGNOSIS — H5710 Ocular pain, unspecified eye: Secondary | ICD-10-CM

## 2017-03-08 DIAGNOSIS — H02409 Unspecified ptosis of unspecified eyelid: Secondary | ICD-10-CM | POA: Diagnosis not present

## 2017-03-08 DIAGNOSIS — I10 Essential (primary) hypertension: Secondary | ICD-10-CM | POA: Diagnosis not present

## 2017-03-08 DIAGNOSIS — E559 Vitamin D deficiency, unspecified: Secondary | ICD-10-CM

## 2017-03-08 LAB — C-REACTIVE PROTEIN: CRP: 1.5 mg/dL (ref 0.5–20.0)

## 2017-03-08 LAB — SEDIMENTATION RATE: Sed Rate: 28 mm/h (ref 0–30)

## 2017-03-08 NOTE — Progress Notes (Signed)
Pre visit review using our clinic review tool, if applicable. No additional management support is needed unless otherwise documented below in the visit note. 

## 2017-03-08 NOTE — Patient Instructions (Addendum)
Around 3 times per week, check your blood pressure 4 times per day. Twice in the morning and twice in the evening. The readings should be at least one minute apart. Write down these values and bring them to your next nurse visit/appointment.  When you check your BP, make sure you have been doing something calm/relaxing 5 minutes prior to checking. Both feet should be flat on the floor and you should be sitting. Use your left arm and make sure it is in a relaxed position (on a table), and that the cuff is at the approximate level/height of your heart.  I want your blood pressure lower than 150 on the top and lower than 90 on the bottom.

## 2017-03-08 NOTE — Progress Notes (Signed)
Chief Complaint  Patient presents with  . Hypertension    pt states her BP has been running high this week     Subjective [name] is a 71 y.o. female who presents for hypertension follow up. She does monitor home blood pressures. Blood pressures ranging from 150-160's's/70-80's on average. They have been running higher over the past 3-4 days. She forgot her home BP monitor. She is compliant with medications- Norvasc 10 mg daily,  Patient has these side effects of medication: none She is adhering to a healthy diet overall. Current exercise: running/walking on treadmill, seated elliptical   Past Medical History:  Diagnosis Date  . Back pain 05/31/2013  . Benign paroxysmal positional vertigo 08/05/2016  . CAD (coronary artery disease)    a. NSTEMI 10/2011: [place] 11/19/11: pLAD 30%, oOM 60%, AVCFX 30%, CFX after OM2 30%, pRCA 60 and 70%, then 99%, AM 80-90% with TIMI 3 flow.  PCI: Promus DES x 2 to RCA; b. 06/2012 Cath: patent RCA stents w/ subtl occl of Acute Marginal (jailed)->Med rx; c. 05/2015 MV: EF 59%, mod mid infsept/inf/ap lat/ap infarct with peri-infarct isch-->Med Rx; d. 02/2016 Cath: LM nl, LAD 30m, RI 50, RCA patent stents.  . Colitis   . Facial skin lesion 01/28/2017  . GERD (gastroesophageal reflux disease)    occasional  . Hyperlipidemia   . Hypertensive heart disease    a. Echocardiogram 11/19/11: Difficult acoustic windows, EF 60-65%, normal LV wall thickness, grade 1 diastolic dysfunction  . Hypoparathyroidism ([place])   . Low zinc level 11/26/2016  . Multinodular thyroid    Goiter s/p thyroidectomy in 2007 with post-op  hypocalcemia and post-op hypothyroidism/hypoparathyroidism with hypocalcemia  . Neck pain on right side 07/13/2013  . Nocturia 05/31/2013  . Pain in right axilla 03/13/2016  . Palpitations    a. 03/2012 - patient set up for event monitor but did not wear correctly -  she declined wearing a repeat monitor  . Post-surgical hypothyroidism   . Sun-damaged skin  12/05/2014  . Tubular adenoma of colon 06/2011  . Unspecified constipation 05/31/2013  . Vertigo   . Zinc deficiency 11/26/2015   Family History  Problem Relation Age of Onset  . Colon cancer Brother   . Cancer Brother     COLON  . Hypertension Father   . Heart disease Father   . Heart attack Father   . Blindness Sister   . Congestive Heart Failure Sister   . Hypertension Sister   . Thyroid disease Sister   . Cancer Brother     multiple myelomas  . Diabetes Neg Hx   . Prostate cancer Neg Hx   . Breast cancer Neg Hx      Medications Current Outpatient Prescriptions on File Prior to Visit  Medication Sig Dispense Refill  . amLODipine (NORVASC) 10 MG tablet Take 1 tablet (10 mg total) by mouth daily. 90 tablet 3  . aspirin EC 81 MG tablet Take 81 mg by mouth daily.      . B Complex-C (B-COMPLEX WITH VITAMIN C) tablet Take 1 tablet by mouth daily.    . calcitRIOL (ROCALTROL) 0.25 MCG capsule TAKE 3 CAPSULES(0.75 MCG) BY MOUTH DAILY 270 capsule 0  . calcium carbonate (TUMS - DOSED IN MG ELEMENTAL CALCIUM) 500 MG chewable tablet Chew 4 tablets by mouth 2 (two) times daily.     . cholecalciferol (VITAMIN D) 1000 UNITS tablet Take 1,000 Units by mouth daily.    . clopidogrel (PLAVIX) 75 MG tablet Take  1 tablet (75 mg total) by mouth daily. 90 tablet 3  . famotidine (PEPCID) 20 MG tablet Take 1 tablet by mouth two  times daily 180 tablet 1  . gabapentin (NEURONTIN) 100 MG capsule Take 3 capsules (300 mg total) by mouth 3 (three) times daily. 270 capsule 11  . isosorbide mononitrate (IMDUR) 60 MG 24 hr tablet TAKE 1 AND 1/2 TABLETS(90 MG) BY MOUTH DAILY 135 tablet 0  . levothyroxine (SYNTHROID, LEVOTHROID) 100 MCG tablet TAKE 1 TABLET(100 MCG) BY MOUTH DAILY 90 tablet 0  . Menthol, Topical Analgesic, (ICY HOT EX) Apply 1 patch topically daily.    . methocarbamol (ROBAXIN) 500 MG tablet Take 2 tablets (1,000 mg total) by mouth every 8 (eight) hours as needed (shoulder pain). 30 tablet 0  .  nitroGLYCERIN (NITROSTAT) 0.4 MG SL tablet Dissolve 1 tablet under the tongue every 5 minutes as   needed for chest pain as   directed 25 tablet 2  . pantoprazole (PROTONIX) 40 MG tablet Take 1 tablet (40 mg total) by mouth daily. 90 tablet 3  . rosuvastatin (CRESTOR) 40 MG tablet TAKE 1 TABLET(40 MG) BY MOUTH DAILY 90 tablet 0   Allergies Allergies  Allergen Reactions  . Zetia [Ezetimibe] Other (See Comments)    Myalgia, paresthesias and weakness    Review of Systems Cardiovascular: no chest pain Respiratory:  no shortness of breath  Exam BP 136/60 (BP Location: Left Arm, Patient Position: Sitting, Cuff Size: Large)   Pulse 74   Temp 97.9 F (36.6 C) (Oral)   Ht 5\' 4"  (1.626 m)   Wt 200 lb (90.7 kg)   SpO2 98%   BMI 34.33 kg/m  General:  well developed, well nourished, in no apparent distress Skin:  warm, no pallor or diaphoresis Eyes:  pupils equal and round, sclera anicteric without injection Heart :RRR, no murmurs, no bruits, no LE edema Lungs:  clear to auscultation, no accessory muscle use Psych: well oriented with normal range of affect and appropriate judgment/insight  Essential hypertension, benign  Keep current regimen same for now. Discussed home monitoring over next 2 weeks. Counseled on diet and exercise F/u in 2 weeks for nurse Bp visit, will bring log and BP monitor to that appointment. The patient voiced understanding and agreement to the plan.  [place], DO 03/08/17  8:23 AM

## 2017-03-11 LAB — ACETYLCHOLINE RECEPTOR, BINDING

## 2017-03-18 DIAGNOSIS — G459 Transient cerebral ischemic attack, unspecified: Secondary | ICD-10-CM | POA: Diagnosis not present

## 2017-03-18 DIAGNOSIS — H538 Other visual disturbances: Secondary | ICD-10-CM | POA: Diagnosis not present

## 2017-03-18 DIAGNOSIS — H02401 Unspecified ptosis of right eyelid: Secondary | ICD-10-CM | POA: Diagnosis not present

## 2017-03-25 ENCOUNTER — Emergency Department (HOSPITAL_BASED_OUTPATIENT_CLINIC_OR_DEPARTMENT_OTHER): Payer: Medicare Other

## 2017-03-25 ENCOUNTER — Encounter (HOSPITAL_BASED_OUTPATIENT_CLINIC_OR_DEPARTMENT_OTHER): Payer: Self-pay | Admitting: Emergency Medicine

## 2017-03-25 ENCOUNTER — Observation Stay (HOSPITAL_BASED_OUTPATIENT_CLINIC_OR_DEPARTMENT_OTHER)
Admission: EM | Admit: 2017-03-25 | Discharge: 2017-03-26 | Disposition: A | Discharge status: Home / Self Care | Payer: Medicare Other | Attending: Internal Medicine | Admitting: Internal Medicine

## 2017-03-25 DIAGNOSIS — I252 Old myocardial infarction: Secondary | ICD-10-CM | POA: Insufficient documentation

## 2017-03-25 DIAGNOSIS — E039 Hypothyroidism, unspecified: Secondary | ICD-10-CM | POA: Diagnosis present

## 2017-03-25 DIAGNOSIS — Z79899 Other long term (current) drug therapy: Secondary | ICD-10-CM | POA: Diagnosis not present

## 2017-03-25 DIAGNOSIS — I1310 Hypertensive heart and chronic kidney disease without heart failure, with stage 1 through stage 4 chronic kidney disease, or unspecified chronic kidney disease: Secondary | ICD-10-CM | POA: Insufficient documentation

## 2017-03-25 DIAGNOSIS — M5430 Sciatica, unspecified side: Secondary | ICD-10-CM | POA: Diagnosis not present

## 2017-03-25 DIAGNOSIS — E784 Other hyperlipidemia: Secondary | ICD-10-CM

## 2017-03-25 DIAGNOSIS — Z955 Presence of coronary angioplasty implant and graft: Secondary | ICD-10-CM | POA: Insufficient documentation

## 2017-03-25 DIAGNOSIS — R0789 Other chest pain: Principal | ICD-10-CM | POA: Insufficient documentation

## 2017-03-25 DIAGNOSIS — Z7982 Long term (current) use of aspirin: Secondary | ICD-10-CM | POA: Insufficient documentation

## 2017-03-25 DIAGNOSIS — Z8249 Family history of ischemic heart disease and other diseases of the circulatory system: Secondary | ICD-10-CM | POA: Diagnosis not present

## 2017-03-25 DIAGNOSIS — K2100 Gastro-esophageal reflux disease with esophagitis, without bleeding: Secondary | ICD-10-CM

## 2017-03-25 DIAGNOSIS — I10 Essential (primary) hypertension: Secondary | ICD-10-CM | POA: Diagnosis not present

## 2017-03-25 DIAGNOSIS — E785 Hyperlipidemia, unspecified: Secondary | ICD-10-CM | POA: Diagnosis not present

## 2017-03-25 DIAGNOSIS — E890 Postprocedural hypothyroidism: Secondary | ICD-10-CM | POA: Insufficient documentation

## 2017-03-25 DIAGNOSIS — N183 Chronic kidney disease, stage 3 (moderate): Secondary | ICD-10-CM | POA: Diagnosis not present

## 2017-03-25 DIAGNOSIS — Z7902 Long term (current) use of antithrombotics/antiplatelets: Secondary | ICD-10-CM | POA: Diagnosis not present

## 2017-03-25 DIAGNOSIS — I2510 Atherosclerotic heart disease of native coronary artery without angina pectoris: Secondary | ICD-10-CM | POA: Insufficient documentation

## 2017-03-25 DIAGNOSIS — K219 Gastro-esophageal reflux disease without esophagitis: Secondary | ICD-10-CM | POA: Diagnosis present

## 2017-03-25 DIAGNOSIS — R079 Chest pain, unspecified: Secondary | ICD-10-CM | POA: Diagnosis not present

## 2017-03-25 HISTORY — DX: Non-ST elevation (NSTEMI) myocardial infarction: I21.4

## 2017-03-25 LAB — CBC
HEMATOCRIT: 37.5 % (ref 36.0–46.0)
HEMOGLOBIN: 12.4 g/dL (ref 12.0–15.0)
MCH: 28.4 pg (ref 26.0–34.0)
MCHC: 33.1 g/dL (ref 30.0–36.0)
MCV: 85.8 fL (ref 78.0–100.0)
Platelets: 231 10*3/uL (ref 150–400)
RBC: 4.37 MIL/uL (ref 3.87–5.11)
RDW: 14.4 % (ref 11.5–15.5)
WBC: 8.1 10*3/uL (ref 4.0–10.5)

## 2017-03-25 LAB — COMPREHENSIVE METABOLIC PANEL
ALBUMIN: 3.8 g/dL (ref 3.5–5.0)
ALK PHOS: 80 U/L (ref 38–126)
ALT: 17 U/L (ref 14–54)
ANION GAP: 9 (ref 5–15)
AST: 19 U/L (ref 15–41)
BUN: 27 mg/dL — ABNORMAL HIGH (ref 6–20)
CHLORIDE: 105 mmol/L (ref 101–111)
CO2: 25 mmol/L (ref 22–32)
Calcium: 8.7 mg/dL — ABNORMAL LOW (ref 8.9–10.3)
Creatinine, Ser: 1.22 mg/dL — ABNORMAL HIGH (ref 0.44–1.00)
GFR calc non Af Amer: 44 mL/min — ABNORMAL LOW (ref >60)
GFR, EST AFRICAN AMERICAN: 51 mL/min — AB (ref >60)
GLUCOSE: 103 mg/dL — AB (ref 65–99)
Potassium: 4 mmol/L (ref 3.5–5.1)
SODIUM: 139 mmol/L (ref 135–145)
Total Bilirubin: 0.5 mg/dL (ref 0.3–1.2)
Total Protein: 7.1 g/dL (ref 6.5–8.1)

## 2017-03-25 LAB — CBC WITH DIFFERENTIAL/PLATELET
BASOS PCT: 1 %
Basophils Absolute: 0.1 10*3/uL (ref 0.0–0.1)
EOS ABS: 0.3 10*3/uL (ref 0.0–0.7)
EOS PCT: 3 %
HCT: 39.6 % (ref 36.0–46.0)
HEMOGLOBIN: 13.4 g/dL (ref 12.0–15.0)
LYMPHS ABS: 2.8 10*3/uL (ref 0.7–4.0)
Lymphocytes Relative: 30 %
MCH: 28.9 pg (ref 26.0–34.0)
MCHC: 33.8 g/dL (ref 30.0–36.0)
MCV: 85.5 fL (ref 78.0–100.0)
Monocytes Absolute: 0.8 10*3/uL (ref 0.1–1.0)
Monocytes Relative: 9 %
Neutro Abs: 5.4 10*3/uL (ref 1.7–7.7)
Neutrophils Relative %: 57 %
PLATELETS: 240 10*3/uL (ref 150–400)
RBC: 4.63 MIL/uL (ref 3.87–5.11)
RDW: 14.6 % (ref 11.5–15.5)
WBC: 9.3 10*3/uL (ref 4.0–10.5)

## 2017-03-25 LAB — TROPONIN I: Troponin I: <0.03 ng/mL (ref <0.03)

## 2017-03-25 LAB — CREATININE, SERUM
Creatinine, Ser: 1.16 mg/dL — ABNORMAL HIGH (ref 0.44–1.00)
GFR calc non Af Amer: 47 mL/min — ABNORMAL LOW (ref >60)
GFR, EST AFRICAN AMERICAN: 54 mL/min — AB (ref >60)

## 2017-03-25 MED ORDER — ASPIRIN EC 81 MG PO TBEC
81.0000 mg | DELAYED_RELEASE_TABLET | Freq: Every day | ORAL | Status: DC
  Administered 2017-03-26: 81 mg via ORAL
  Filled 2017-03-25: qty 1

## 2017-03-25 MED ORDER — FAMOTIDINE 20 MG PO TABS
20.0000 mg | ORAL_TABLET | Freq: Two times a day (BID) | ORAL | Status: DC
  Administered 2017-03-25 – 2017-03-26 (×2): 20 mg via ORAL
  Filled 2017-03-25 (×2): qty 1

## 2017-03-25 MED ORDER — GI COCKTAIL ~~LOC~~: 30.0000 mL | Freq: Four times a day (QID) | ORAL | Status: DC | PRN

## 2017-03-25 MED ORDER — NITROGLYCERIN 0.4 MG SL SUBL
0.4000 mg | SUBLINGUAL_TABLET | SUBLINGUAL | Status: DC | PRN
  Administered 2017-03-25 (×2): 0.4 mg via SUBLINGUAL
  Filled 2017-03-25: qty 1

## 2017-03-25 MED ORDER — ASPIRIN 81 MG PO CHEW
81.0000 mg | CHEWABLE_TABLET | Freq: Once | ORAL | Status: AC
  Administered 2017-03-25: 81 mg via ORAL
  Filled 2017-03-25: qty 1

## 2017-03-25 MED ORDER — ROSUVASTATIN CALCIUM 10 MG PO TABS
40.0000 mg | ORAL_TABLET | Freq: Every day | ORAL | Status: DC
  Filled 2017-03-25: qty 4

## 2017-03-25 MED ORDER — AMLODIPINE BESYLATE 10 MG PO TABS
10.0000 mg | ORAL_TABLET | Freq: Every day | ORAL | Status: DC
Start: 2017-03-26 — End: 2017-03-26
  Administered 2017-03-26: 10 mg via ORAL
  Filled 2017-03-25: qty 1

## 2017-03-25 MED ORDER — VITAMIN D 1000 UNITS PO TABS
1000.0000 [IU] | ORAL_TABLET | Freq: Every day | ORAL | Status: DC
  Administered 2017-03-26: 1000 [IU] via ORAL
  Filled 2017-03-25: qty 1

## 2017-03-25 MED ORDER — OXYCODONE-ACETAMINOPHEN 5-325 MG PO TABS: 1.0000 | ORAL_TABLET | Freq: Four times a day (QID) | ORAL | Status: DC | PRN

## 2017-03-25 MED ORDER — ONDANSETRON HCL 4 MG/2ML IJ SOLN: 4.0000 mg | Freq: Four times a day (QID) | INTRAMUSCULAR | Status: DC | PRN

## 2017-03-25 MED ORDER — OXYCODONE-ACETAMINOPHEN 10-325 MG PO TABS: 1.0000 | ORAL_TABLET | Freq: Four times a day (QID) | ORAL | Status: DC | PRN

## 2017-03-25 MED ORDER — NITROGLYCERIN 0.4 MG SL SUBL: 0.4000 mg | SUBLINGUAL_TABLET | SUBLINGUAL | Status: DC | PRN

## 2017-03-25 MED ORDER — HEPARIN SODIUM (PORCINE) 5000 UNIT/ML IJ SOLN
5000.0000 [IU] | Freq: Three times a day (TID) | INTRAMUSCULAR | Status: DC
  Administered 2017-03-26: 5000 [IU] via SUBCUTANEOUS
  Filled 2017-03-25: qty 1

## 2017-03-25 MED ORDER — OXYCODONE HCL 5 MG PO TABS: 5.0000 mg | ORAL_TABLET | Freq: Four times a day (QID) | ORAL | Status: DC | PRN

## 2017-03-25 MED ORDER — MORPHINE SULFATE (PF) 2 MG/ML IV SOLN: 2.0000 mg | INTRAVENOUS | Status: DC | PRN

## 2017-03-25 MED ORDER — GABAPENTIN 300 MG PO CAPS: 300.0000 mg | ORAL_CAPSULE | Freq: Three times a day (TID) | ORAL | Status: DC | PRN

## 2017-03-25 MED ORDER — METHOCARBAMOL 500 MG PO TABS: 1000.0000 mg | ORAL_TABLET | Freq: Three times a day (TID) | ORAL | Status: DC | PRN

## 2017-03-25 MED ORDER — ACETAMINOPHEN 325 MG PO TABS: 650.0000 mg | ORAL_TABLET | ORAL | Status: DC | PRN

## 2017-03-25 NOTE — H&P (Signed)
History and Physical    [name]:[id number] DOB: Sep 23, 1946 DOA: 03/25/2017  PCP: Taylor Homans, Rice   Patient coming from: [facility]  Chief Complaint: Chest pain  HPI: [name] is a 71 y.o. female with medical history significant for known coronary artery disease with history of NST MI in 2012, status post stent placement 2 in the past, hypertension, hyperlipidemia, GERD and esophagitis, acquired hypothyroidism post thyroid surgery, hyperparathyroidism who presented to the emergency room at [facility] earlier this morning with complaints of chest pain described as a heavy weight sitting in the mid chest radiating to the left upper shoulder. Patient denied shortness of breath, nausea, ordiaphoresis. Approximately 3 days ago she was lying in bed and suddenly started having shaking chills followed by severe right sided neck pain lasting for a few minutes.  Today the sensation of heaviness has been present all the time and during my assessment patient still complains of some lower chest discomfort felt like tightness, pressure She took two aspirin pills and presented to the [facility] center  ED Course: Upon presentation patient had stable vital signs with heart rate was between 09'F, systolic blood pressure was mainly in the 130s,  Troponin was negative - less than 0.03, creatinine was elevated to 1.22 and BUN 27, CBC was normal EKG showed sinus rhythm, no acute ischemic ST-T wave changes. Chest x-ray didn't reveal any acute cardiopulmonary process  Review of Systems: As per HPI otherwise 10 point review of systems negative.   Ambulatory Status: Independent  Past Medical History:  Diagnosis Date  . Acute MI, subendocardial ([place])   . Back pain 05/31/2013  . Benign paroxysmal positional vertigo 08/05/2016  . CAD (coronary artery disease)    a. NSTEMI 10/2011: [place] 11/19/11: pLAD 30%, oOM 60%, AVCFX 30%, CFX after OM2 30%, pRCA 60 and 70%, then 99%, AM  80-90% with TIMI 3 flow.  PCI: Promus DES x 2 to RCA; b. 06/2012 Cath: patent RCA stents w/ subtl occl of Acute Marginal (jailed)->Med rx; c. 05/2015 MV: EF 59%, mod mid infsept/inf/ap lat/ap infarct with peri-infarct isch-->Med Rx; d. 02/2016 Cath: LM nl, LAD 35m, RI 50, RCA patent stents.  . Colitis   . Facial skin lesion 01/28/2017  . GERD (gastroesophageal reflux disease)    occasional  . Hyperlipidemia   . Hypertension   . Hypertensive heart disease    a. Echocardiogram 11/19/11: Difficult acoustic windows, EF 60-65%, normal LV wall thickness, grade 1 diastolic dysfunction  . Hypoparathyroidism ([place])   . Low zinc level 11/26/2016  . Multinodular thyroid    Goiter s/p thyroidectomy in 2007 with post-op  hypocalcemia and post-op hypothyroidism/hypoparathyroidism with hypocalcemia  . Neck pain on right side 07/13/2013  . Nocturia 05/31/2013  . Pain in right axilla 03/13/2016  . Palpitations    a. 03/2012 - patient set up for event monitor but did not wear correctly -  she declined wearing a repeat monitor  . Post-surgical hypothyroidism   . Sun-damaged skin 12/05/2014  . Tubular adenoma of colon 06/2011  . Unspecified constipation 05/31/2013  . Vertigo   . Zinc deficiency 11/26/2015    Past Surgical History:  Procedure Laterality Date  . CARDIAC CATHETERIZATION N/A 03/08/2016   Procedure: Left Heart Cath and Coronary Angiography;  Surgeon: [name];  Location: [place] CV LAB;  Service: Cardiovascular;  Laterality: N/A;  . COLONOSCOPY  Aenomatous polyps   07/05/2011  . COLONOSCOPY N/A 09/28/2014   Procedure: COLONOSCOPY;  Surgeon: [name];  Location: [facility];  Service: Endoscopy;  Laterality: N/A;  . CORONARY ANGIOPLASTY WITH STENT PLACEMENT    . LEFT HEART CATHETERIZATION WITH CORONARY ANGIOGRAM N/A 07/17/2012   Procedure: LEFT HEART CATHETERIZATION WITH CORONARY ANGIOGRAM;  Surgeon: [name];  Location: [facility] CATH LAB;  Service: Cardiovascular;   Laterality: N/A;  . PERCUTANEOUS CORONARY STENT INTERVENTION (PCI-S) N/A 11/19/2011   Procedure: PERCUTANEOUS CORONARY STENT INTERVENTION (PCI-S);  Surgeon: [name];  Location: [facility] CATH LAB;  Service: Cardiovascular;  Laterality: N/A;  . SHOULDER ARTHROSCOPY W/ ROTATOR CUFF REPAIR     right  . TOTAL THYROIDECTOMY  2007   GOITER    Social History   Social History  . Marital status: Married    Spouse name: N/A  . Number of children: 4  . Years of education: 14   Occupational History  . Sales person at [employer] Topics  . Smoking status: Never Smoker  . Smokeless tobacco: Never Used  . Alcohol use No  . Drug use: No  . Sexual activity: Not on file   Other Topics Concern  . Not on file   Social History Narrative   Lives at home alone.  Her son lives near her.   Right-handed.   1 cup coffee per day.    Allergies  Allergen Reactions  . Zetia [Ezetimibe] Other (See Comments)    Myalgia, paresthesias and weakness    Family History  Problem Relation Age of Onset  . Colon cancer Brother   . Cancer Brother     COLON  . Hypertension Father   . Heart disease Father   . Heart attack Father   . Blindness Sister   . Congestive Heart Failure Sister   . Hypertension Sister   . Thyroid disease Sister   . Cancer Brother     multiple myelomas  . Diabetes Neg Hx   . Prostate cancer Neg Hx   . Breast cancer Neg Hx     Prior to Admission medications   Medication Sig Start Date End Date Taking? Authorizing Provider  amLODipine (NORVASC) 10 MG tablet Take 1 tablet (10 mg total) by mouth daily. 05/07/16  Yes [name]  aspirin EC 81 MG tablet Take 81 mg by mouth daily.   11/22/11  Yes [name]  B Complex-C (B-COMPLEX WITH VITAMIN C) tablet Take 1 tablet by mouth daily.   Yes [name]  calcitRIOL (ROCALTROL) 0.25 MCG capsule TAKE 3 CAPSULES(0.75 MCG) BY MOUTH DAILY 02/12/17  Yes [name]  calcium carbonate  (TUMS - DOSED IN MG ELEMENTAL CALCIUM) 500 MG chewable tablet Chew 4 tablets by mouth 2 (two) times daily.    Yes [name]  cholecalciferol (VITAMIN D) 1000 UNITS tablet Take 1,000 Units by mouth daily.   Yes [name]  clopidogrel (PLAVIX) 75 MG tablet Take 1 tablet (75 mg total) by mouth daily. 07/26/16  Yes [name]  famotidine (PEPCID) 20 MG tablet Take 1 tablet by mouth two  times daily 07/26/16  Yes [name]  gabapentin (NEURONTIN) 300 MG capsule Take 1 capsule by mouth 3 (three) times daily as needed. 12/25/16  Yes [name]  isosorbide mononitrate (IMDUR) 60 MG 24 hr tablet TAKE 1 AND 1/2 TABLETS(90 MG) BY MOUTH DAILY 02/12/17  Yes [name]  levothyroxine (SYNTHROID, LEVOTHROID) 100 MCG tablet TAKE  1 TABLET(100 MCG) BY MOUTH DAILY 02/12/17  Yes [name]  Menthol, Topical Analgesic, (ICY HOT EX) Apply 1 patch topically daily.   Yes [name]  methocarbamol (ROBAXIN) 500 MG tablet Take 2 tablets (1,000 mg total) by mouth every 8 (eight) hours as needed (shoulder pain). 03/15/16  Yes Julianne Rice, Rice  nitroGLYCERIN (NITROSTAT) 0.4 MG SL tablet Dissolve 1 tablet under the tongue every 5 minutes as   needed for chest pain as   directed 03/13/16  Yes [name]  oxyCODONE-acetaminophen (PERCOCET) 10-325 MG tablet Take 1 tablet by mouth 4 (four) times daily as needed. 12/25/16  Yes [name]  pantoprazole (PROTONIX) 40 MG tablet Take 1 tablet (40 mg total) by mouth daily. 11/26/16  Yes [name]  rosuvastatin (CRESTOR) 40 MG tablet TAKE 1 TABLET(40 MG) BY MOUTH DAILY 02/18/17  Yes [name]    Physical Exam: Vitals:   03/25/17 1230 03/25/17 1300 03/25/17 1330 03/25/17 1447  BP: 125/65 (!) 158/90 117/63 (!) 130/55  Pulse: 62 66 (!) 57 63  Resp: (!) 7 16 14    Temp:    97.8 F (36.6 C)  TempSrc:    Oral  SpO2: 96% 96% 96% 97%  Weight:    88.3 kg (194 lb 9.6 oz)    Height:    5\' 4"  (1.626 m)     General: Appears calm and comfortable Eyes: PERRLA, EOMI, normal lids, iris ENT:  grossly normal hearing, lips & tongue, mucous membranes moist and intact Neck: no lymphoadenopathy, masses or thyromegaly Cardiovascular: RRR, no m/r/g. No JVD, carotid bruits. No LE edema.  Respiratory: bilateral no wheezes, rales, rhonchi or cracles. Normal respiratory effort. No accessory muscle use observed Abdomen: soft, non-tender, non-distended, no organomegaly or masses appreciated. BS present in all quadrants Skin: no rash, ulcers or induration seen on limited exam Musculoskeletal: grossly normal tone BUE/BLE, good ROM, no bony abnormality or joint deformities observed Psychiatric: grossly normal mood and affect, speech fluent and appropriate, alert and oriented x3 Neurologic: CN II-XII grossly intact, moves all extremities in coordinated fashion, sensation intact  Labs on Admission: I have personally reviewed following labs and imaging studies  CBC, BMP  GFR: Estimated Creatinine Clearance (by C-G formula based on SCr of 1.22 mg/dL (H)) Female: 46.1 mL/min (A) Female: 56.4 mL/min (A)   Creatinine Clearance: Estimated Creatinine Clearance (by C-G formula based on SCr of 1.22 mg/dL (H)) Female: 46.1 mL/min (A) Female: 56.4 mL/min (A)    Radiological Exams on Admission: Dg Chest 2 View  Result Date: 03/25/2017 CLINICAL DATA:  Chest pain EXAM: CHEST  2 VIEW COMPARISON:  June 13, 2016 FINDINGS: There is slight scarring in the left base. There is no edema or consolidation. The heart size and pulmonary vascularity are normal. No adenopathy. There is calcification in the right coronary artery. There is also aortic atherosclerosis. There is postoperative change in the thyroid region. No evident bone lesions. IMPRESSION: Slight scarring left base. No edema or consolidation. Right coronary artery calcifications well as aortic atherosclerosis. Stable cardiac silhouette.  Electronically Signed   By: Lowella Grip III M.D.   On: 03/25/2017 12:01    EKG: Independently reviewed - sinus rhythm, no acute  ischemic ST-T wave changes  Assessment/Plan Principal Problem:   Atypical chest pain Active Problems:   Hypothyroidism   Hyperlipidemia   Essential hypertension, benign   CAD, NATIVE VESSEL   GERD (gastroesophageal reflux disease)    Chest pain  in patient with known history of coronary disease and previous MI Most recent echocardiogram from March, 2017 showed EF 60-65%, mild LVH, normal wall motion, grade I diastolic dysfunction Cardiac cath in March 2017 shower 25% LAD stenosis and 50% RI stenosis, widely patent RCA stent Continue cycle cardiac enzymes, monitor on telemetry Continue aspirin, Plavix  Doubt pain is related to cardiac ischemia, probably secondary to musculoskeletal or GERD/esophagitis  Hyperlipidemia Continue Crestor  Hypertension - currently stable Continue home medication and adjust the doses if needed depending on the BP readings  Hypothyroidism - Continue Synthroid   GERD Patient is on Pepcid and Protonix Will add Carafate if pain persist Might consider outpatient GI work up to r/o esophagitis  CKD stage III with GFR < 60 - baseline creatinine fluctuates between 1.14-1.21 Continue to monitor and avoid nephrotoxic drugs  Sciatica with neurologic claudication Continue Neurontin, Robaxin and Roxycodone prn   DVT prophylaxis:   Heparin Code Status: full Family Commication: at bedside Disposition Plan: telemetry Consults called: none Admission status: observation    York Grice, [place] Pager: [phone] [facility]  If 7PM-7AM, please contact night-coverage [email or web address] Password [facility]  03/25/2017, 4:58 PM

## 2017-03-25 NOTE — Progress Notes (Signed)
   Patient coming from [facility] for ACS r/o w/ complaints of substernal CP and heaviness w/ radiation to L jaw w/ h/o CAD s/p cath and stent placement. EKG w/o ACS. Pain relieved w/ nitro and ASA. Also w/ h/o GERD w/ esophagitis. Pt accepted to [facility] under observation tele services.   Linna Darner, MD [facility] 03/25/2017, 1:18 PM

## 2017-03-25 NOTE — ED Triage Notes (Signed)
Chest pain since 0900, this am. Intermittent, "heavy" to mid chest. Denies SOB or N/V . Took 2 Baby ASA PTA, prior MI x 3-4 years ago

## 2017-03-25 NOTE — ED Notes (Signed)
RESP 15

## 2017-03-25 NOTE — ED Notes (Signed)
Attempted to call report to 2W; the staff is addressing a leak at the moment and will call back for report.

## 2017-03-25 NOTE — ED Provider Notes (Signed)
[facility] DEPT Provider Note   CSN: [id number] Arrival date & time: 03/25/17  1118     History   Chief Complaint Chief Complaint  Patient presents with  . Chest Pain    HPI [name] is a 71 y.o. female.  HPI   Patient is 71 year old female with history of hypertension, CAD, stent placement 1 year ago, presenting today with chest pain. Patient had 2 episodes of sharp substernal chest pain radiating to her left jaw. It was then followed by heaviness. The heaviness has been persistent. She denies any shortness of breath or diaphoresis. Patient says it feels similar to her last MI.  Past Medical History:  Diagnosis Date  . Acute MI, subendocardial ([place])   . Back pain 05/31/2013  . Benign paroxysmal positional vertigo 08/05/2016  . CAD (coronary artery disease)    a. NSTEMI 10/2011: [place] 11/19/11: pLAD 30%, oOM 60%, AVCFX 30%, CFX after OM2 30%, pRCA 60 and 70%, then 99%, AM 80-90% with TIMI 3 flow.  PCI: Promus DES x 2 to RCA; b. 06/2012 Cath: patent RCA stents w/ subtl occl of Acute Marginal (jailed)->Med rx; c. 05/2015 MV: EF 59%, mod mid infsept/inf/ap lat/ap infarct with peri-infarct isch-->Med Rx; d. 02/2016 Cath: LM nl, LAD 72m, RI 50, RCA patent stents.  . Colitis   . Facial skin lesion 01/28/2017  . GERD (gastroesophageal reflux disease)    occasional  . Hyperlipidemia   . Hypertension   . Hypertensive heart disease    a. Echocardiogram 11/19/11: Difficult acoustic windows, EF 60-65%, normal LV wall thickness, grade 1 diastolic dysfunction  . Hypoparathyroidism ([place])   . Low zinc level 11/26/2016  . Multinodular thyroid    Goiter s/p thyroidectomy in 2007 with post-op  hypocalcemia and post-op hypothyroidism/hypoparathyroidism with hypocalcemia  . Neck pain on right side 07/13/2013  . Nocturia 05/31/2013  . Pain in right axilla 03/13/2016  . Palpitations    a. 03/2012 - patient set up for event monitor but did not wear correctly -  she declined wearing a repeat monitor    . Post-surgical hypothyroidism   . Sun-damaged skin 12/05/2014  . Tubular adenoma of colon 06/2011  . Unspecified constipation 05/31/2013  . Vertigo   . Zinc deficiency 11/26/2015    Patient Active Problem List   Diagnosis Date Noted  . Peripheral arterial disease ([place]) 02/20/2017  . Facial skin lesion 01/28/2017  . Low zinc level 11/26/2016  . Benign paroxysmal positional vertigo 08/05/2016  . Atypical chest pain 03/27/2016  . Esophageal reflux 03/27/2016  . Antiplatelet or antithrombotic long-term use 03/27/2016  . Pain in right axilla 03/13/2016  . Chest pain 03/07/2016  . Right lumbar radiculopathy 02/21/2016  . Abnormality of gait 02/21/2016  . Zinc deficiency 11/26/2015  . Chronic low back pain 10/26/2015  . Right hip pain 10/26/2015  . Medicare annual wellness visit, subsequent 03/13/2015  . Preventative health care 03/13/2015  . Sun-damaged skin 12/05/2014  . Angina of effort ([place]) 12/02/2014  . Hx of colonic polyps 09/28/2014  . Benign neoplasm of descending colon 09/28/2014  . Other fatigue 09/10/2014  . Personal history of colonic polyps 07/16/2014  . Bilateral hand pain 03/20/2014  . Postsurgical hypoparathyroidism ([place]) 08/03/2013  . Neck pain on right side 07/13/2013  . Constipation 05/31/2013  . Back pain 05/31/2013  . Nocturia 05/31/2013  . GERD (gastroesophageal reflux disease) 11/02/2012  . Tension headache 11/02/2012  . Anemia 06/11/2012  . Hypokalemia 06/11/2012  . Depression 01/21/2012  . Headache(784.0) 12/23/2011  .  Bradycardia 12/07/2011  . Hypocalcemia 12/07/2011  . Acute MI, subendocardial ([place]) 11/20/2011  . Hypothyroidism 05/20/2009  . Essential hypertension, benign 05/20/2009  . Hyperlipidemia 03/08/2009  . CAD, NATIVE VESSEL 03/08/2009    Past Surgical History:  Procedure Laterality Date  . CARDIAC CATHETERIZATION N/A 03/08/2016   Procedure: Left Heart Cath and Coronary Angiography;  Surgeon: Jettie Booze, MD;  Location: [facility] CV LAB;  Service: Cardiovascular;  Laterality: N/A;  . COLONOSCOPY  Aenomatous polyps   07/05/2011  . COLONOSCOPY N/A 09/28/2014   Procedure: COLONOSCOPY;  Surgeon: Ladene Artist, MD;  Location: [facility] ENDOSCOPY;  Service: Endoscopy;  Laterality: N/A;  . CORONARY ANGIOPLASTY WITH STENT PLACEMENT    . LEFT HEART CATHETERIZATION WITH CORONARY ANGIOGRAM N/A 07/17/2012   Procedure: LEFT HEART CATHETERIZATION WITH CORONARY ANGIOGRAM;  Surgeon: Hillary Bow, MD;  Location[facility] CATH LAB;  Service: Cardiovascular;  Laterality: N/A;  . PERCUTANEOUS CORONARY STENT INTERVENTION (PCI-S) N/A 11/19/2011   Procedure: PERCUTANEOUS CORONARY STENT INTERVENTION (PCI-S);  Surgeon: Hillary Bow, MD;  Location: [facility] CATH LAB;  Service: Cardiovascular;  Laterality: N/A;  . SHOULDER ARTHROSCOPY W/ ROTATOR CUFF REPAIR     right  . TOTAL THYROIDECTOMY  2007   GOITER    OB History    Gravida Para Term Preterm AB Living   4 4 4     4    SAB TAB Ectopic Multiple Live Births           4       Home Medications    Prior to Admission medications   Medication Sig Start Date End Date Taking? Authorizing Provider  amLODipine (NORVASC) 10 MG tablet Take 1 tablet (10 mg total) by mouth daily. 05/07/16  Yes Mosie Lukes, MD  aspirin EC 81 MG tablet Take 81 mg by mouth daily.   11/22/11  Yes Dayna N Dunn, PA-C  B Complex-C (B-COMPLEX WITH VITAMIN C) tablet Take 1 tablet by mouth daily.   Yes Historical Provider, MD  calcitRIOL (ROCALTROL) 0.25 MCG capsule TAKE 3 CAPSULES(0.75 MCG) BY MOUTH DAILY 02/12/17  Yes Mosie Lukes, MD  calcium carbonate (TUMS - DOSED IN MG ELEMENTAL CALCIUM) 500 MG chewable tablet Chew 4 tablets by mouth 2 (two) times daily.    Yes Historical Provider, MD  cholecalciferol (VITAMIN D) 1000 UNITS tablet Take 1,000 Units by mouth daily.   Yes Historical Provider, MD  clopidogrel (PLAVIX) 75 MG tablet Take 1 tablet (75 mg total) by mouth daily. 07/26/16  Yes Mosie Lukes, MD  famotidine  (PEPCID) 20 MG tablet Take 1 tablet by mouth two  times daily 07/26/16  Yes Mosie Lukes, MD  gabapentin (NEURONTIN) 300 MG capsule Take 1 capsule by mouth 3 (three) times daily as needed. 12/25/16  Yes Historical Provider, MD  isosorbide mononitrate (IMDUR) 60 MG 24 hr tablet TAKE 1 AND 1/2 TABLETS(90 MG) BY MOUTH DAILY 02/12/17  Yes Mosie Lukes, MD  levothyroxine (SYNTHROID, LEVOTHROID) 100 MCG tablet TAKE 1 TABLET(100 MCG) BY MOUTH DAILY 02/12/17  Yes Mosie Lukes, MD  Menthol, Topical Analgesic, (ICY HOT EX) Apply 1 patch topically daily.   Yes Historical Provider, MD  methocarbamol (ROBAXIN) 500 MG tablet Take 2 tablets (1,000 mg total) by mouth every 8 (eight) hours as needed (shoulder pain). 03/15/16  Yes Julianne Rice, MD  Multiple Vitamin (MULTIVITAMIN WITH MINERALS) TABS tablet Take 1 tablet by mouth daily.   Yes Historical Provider, MD  oxyCODONE-acetaminophen (PERCOCET) 10-325 MG tablet Take  1 tablet by mouth 4 (four) times daily as needed. 12/25/16  Yes Historical Provider, MD  pantoprazole (PROTONIX) 40 MG tablet Take 1 tablet (40 mg total) by mouth daily. 11/26/16  Yes Mosie Lukes, MD  rosuvastatin (CRESTOR) 40 MG tablet TAKE 1 TABLET(40 MG) BY MOUTH DAILY 02/18/17  Yes Mosie Lukes, MD  nitroGLYCERIN (NITROSTAT) 0.4 MG SL tablet Dissolve 1 tablet under the tongue every 5 minutes as   needed for chest pain as   directed 03/27/17   Mosie Lukes, MD    Family History Family History  Problem Relation Age of Onset  . Colon cancer Brother   . Cancer Brother     COLON  . Hypertension Father   . Heart disease Father   . Heart attack Father   . Blindness Sister   . Congestive Heart Failure Sister   . Hypertension Sister   . Thyroid disease Sister   . Cancer Brother     multiple myelomas  . Diabetes Neg Hx   . Prostate cancer Neg Hx   . Breast cancer Neg Hx     Social History Social History  Substance Use Topics  . Smoking status: Never Smoker  . Smokeless tobacco: Never  Used  . Alcohol use Rice     Allergies   Zetia [ezetimibe]   Review of Systems Review of Systems  Constitutional: Negative for fatigue and fever.  HENT: Negative for congestion.   Respiratory: Negative for cough and shortness of breath.   Cardiovascular: Positive for chest pain. Negative for palpitations.  Neurological: Negative for light-headedness.  All other systems reviewed and are negative.    Physical Exam Updated Vital Signs BP 121/62   Pulse (!) 52   Temp 97.8 F (36.6 C) (Oral)   Resp 18   Ht 5\' 4"  (1.626 m)   Wt 194 lb 9.6 oz (88.3 kg)   SpO2 98%   BMI 33.40 kg/m   Physical Exam  Constitutional: She is oriented to person, place, and time. She appears well-developed and well-nourished.  HENT:  Head: Normocephalic and atraumatic.  Eyes: Right eye exhibits Rice discharge.  Cardiovascular: Normal rate, regular rhythm and normal heart sounds.   Rice murmur heard. Pulmonary/Chest: Effort normal and breath sounds normal. She has Rice wheezes. She has Rice rales.  Abdominal: Soft. She exhibits Rice distension. There is Rice tenderness.  Neurological: She is oriented to person, place, and time.  Skin: Skin is warm and dry. She is not diaphoretic.  Psychiatric: She has a normal mood and affect.  Nursing note and vitals reviewed.    ED Treatments / Results  Labs (all labs ordered are listed, but only abnormal results are displayed) Labs Reviewed  COMPREHENSIVE METABOLIC PANEL - Abnormal; Notable for the following:       Result Value   Glucose, Bld 103 (*)    BUN 27 (*)    Creatinine, Ser 1.22 (*)    Calcium 8.7 (*)    GFR calc non Af Amer 44 (*)    GFR calc Af Amer 51 (*)    All other components within normal limits  CREATININE, SERUM - Abnormal; Notable for the following:    Creatinine, Ser 1.16 (*)    GFR calc non Af Amer 47 (*)    GFR calc Af Amer 54 (*)    All other components within normal limits  CBC WITH DIFFERENTIAL/PLATELET  TROPONIN I  TROPONIN I    TROPONIN I  CBC  EKG  EKG Interpretation  Date/Time:  Monday March 25 2017 11:25:53 EDT Ventricular Rate:  66 PR Interval:    QRS Duration: 106 QT Interval:  432 QTC Calculation: 453 R Axis:   19 Text Interpretation:  Sinus rhythm Low voltage, precordial leads Borderline T abnormalities, anterior leads Rice significant change since last tracing Confirmed by Gerald Leitz ([id number]) on 03/25/2017 11:29:49 AM Also confirmed by Gerald Leitz ([id number]), editor Lorenda Cahill CT, Leda Gauze [phone])  on 03/25/2017 11:49:36 AM       Radiology Rice results found.  Procedures Procedures (including critical care time)  Medications Ordered in ED Medications  aspirin chewable tablet 81 mg (81 mg Oral Given 03/25/17 1213)  levothyroxine (SYNTHROID, LEVOTHROID) tablet 100 mcg (100 mcg Oral Given 03/26/17 1048)     Initial Impression / Assessment and Plan / ED Course  I have reviewed the triage vital signs and the nursing notes.  Pertinent labs & imaging results that were available during my care of the patient were reviewed by me and considered in my medical decision making (see chart for details).     Patient well-appearing 71 year old female presenting with chest pressure. Patient's story is fairly convincing for ischemic heart disease. Will initiate troponin, EKG. Will give supplemental nitroglycerin to see this aleeves pain. Patient took 2 baby aspirin prior to arrival, will give third to total 325.  Cath in 2017 showed patietn RCA stents (from 2012)  Had EGD in 12/2015 for atypical chest pain, showed esophagitis. Will admit for CP rule out to medicine at [facility].    Final Clinical Impressions(s) / ED Diagnoses   Final diagnoses:  Gastroesophageal reflux disease with esophagitis    New Prescriptions Discharge Medication List as of 03/26/2017 12:01 PM       Courteney Lyn Mackuen, MD 03/27/17 1601

## 2017-03-25 NOTE — ED Notes (Signed)
Report to Joy,RN  on 2W

## 2017-03-26 DIAGNOSIS — E039 Hypothyroidism, unspecified: Secondary | ICD-10-CM

## 2017-03-26 DIAGNOSIS — I10 Essential (primary) hypertension: Secondary | ICD-10-CM

## 2017-03-26 DIAGNOSIS — K210 Gastro-esophageal reflux disease with esophagitis: Secondary | ICD-10-CM | POA: Diagnosis not present

## 2017-03-26 DIAGNOSIS — R0789 Other chest pain: Secondary | ICD-10-CM

## 2017-03-26 LAB — TROPONIN I

## 2017-03-26 MED ORDER — LEVOTHYROXINE SODIUM 100 MCG PO TABS: 100.0000 ug | ORAL_TABLET | Freq: Every day | ORAL | Status: DC

## 2017-03-26 MED ORDER — PANTOPRAZOLE SODIUM 40 MG PO TBEC
40.0000 mg | DELAYED_RELEASE_TABLET | Freq: Every day | ORAL | Status: DC
  Administered 2017-03-26: 40 mg via ORAL
  Filled 2017-03-26: qty 1

## 2017-03-26 MED ORDER — CLOPIDOGREL BISULFATE 75 MG PO TABS
75.0000 mg | ORAL_TABLET | Freq: Every day | ORAL | Status: DC
  Administered 2017-03-26: 75 mg via ORAL
  Filled 2017-03-26: qty 1

## 2017-03-26 MED ORDER — LEVOTHYROXINE SODIUM 100 MCG PO TABS
100.0000 ug | ORAL_TABLET | Freq: Once | ORAL | Status: AC
  Administered 2017-03-26: 100 ug via ORAL
  Filled 2017-03-26: qty 1

## 2017-03-26 NOTE — Progress Notes (Signed)
Note placed on summary page/sticky note to MD reporting patient's request for synthroid order and tums order, as she takes these at home.

## 2017-03-26 NOTE — Progress Notes (Signed)
Pt is prepared for discharge, telemetry discontinued and IV access discontinued without complications. Pt is asymptomatic, with normal vital signs. Pt is accompanied by her husband.   Pt's AVS is prepared, reviewed, and she is clear for discharge.

## 2017-03-26 NOTE — Discharge Summary (Signed)
Physician Discharge Summary  [name] TDD:[id number] DOB: 04/26/1946 DOA: 03/25/2017  PCP: Penni Homans, MD  Admit date: 03/25/2017 Discharge date: 03/26/2017   Recommendations for Outpatient Follow-Up:   1. Suspect GI etiology-- diet for GERD given- may need increase in protonix as well   Discharge Diagnosis:   Principal Problem:   Atypical chest pain Active Problems:   Hypothyroidism   Hyperlipidemia   Essential hypertension, benign   CAD, NATIVE VESSEL   GERD (gastroesophageal reflux disease)   Discharge disposition:  Home.   Discharge Condition: Improved.  Diet recommendation: GERD diet  Wound care: None.   History of Present Illness:   [name] is a 71 y.o. female who presents with CP. Doubt true cardiac etiology. Likely GI .   Hospital Course by Problem:   Chest pain -suspect from esophageal spasms- happened while drinking coffee -CE negative -tele reviewed- few PVCs -recent cath from 2017 stable: 25% LAD stenosis and 50% RI stenosis, widely patent RCA stent -had EGC last year with esophagitis  Hyperlipidemia Continue Crestor  Hypertension - Continue home medication  Hypothyroidism - Continue Synthroid   CKD stage III with GFR < 60 - baseline creatinine fluctuates between 1.14-1.21 Continue to monitor and avoid nephrotoxic drugs  Sciatica with neurologic claudication Continue Neurontin, Robaxin and Roxycodone prn   Medical Consultants:    None.   Discharge Exam:   Vitals:   03/26/17 0423 03/26/17 1002  BP: (!) 121/47 121/62  Pulse: (!) 52   Resp: 18   Temp: 97.8 F (36.6 C)    Vitals:   03/25/17 1447 03/25/17 2033 03/26/17 0423 03/26/17 1002  BP: (!) 130/55 (!) 126/54 (!) 121/47 121/62  Pulse: 63 66 (!) 52   Resp:  16 18   Temp: 97.8 F (36.6 C) 98.2 F (36.8 C) 97.8 F (36.6 C)   TempSrc: Oral Oral Oral   SpO2: 97% 97% 98%   Weight: 88.3 kg (194 lb 9.6 oz)     Height: 5\' 4"  (1.626 m)       Gen:  NAD-  no further episodes    The results of significant diagnostics from this hospitalization (including imaging, microbiology, ancillary and laboratory) are listed below for reference.     Procedures and Diagnostic Studies:   Dg Chest 2 View  Result Date: 03/25/2017 CLINICAL DATA:  Chest pain EXAM: CHEST  2 VIEW COMPARISON:  June 13, 2016 FINDINGS: There is slight scarring in the left base. There is no edema or consolidation. The heart size and pulmonary vascularity are normal. No adenopathy. There is calcification in the right coronary artery. There is also aortic atherosclerosis. There is postoperative change in the thyroid region. No evident bone lesions. IMPRESSION: Slight scarring left base. No edema or consolidation. Right coronary artery calcifications well as aortic atherosclerosis. Stable cardiac silhouette. Electronically Signed   By: Lowella Grip III M.D.   On: 03/25/2017 12:01     Labs:   Basic Metabolic Panel:  Recent Labs Lab 03/25/17 1139 03/25/17 1646  NA 139  --   K 4.0  --   CL 105  --   CO2 25  --   GLUCOSE 103*  --   BUN 27*  --   CREATININE 1.22* 1.16*  CALCIUM 8.7*  --    GFR Estimated Creatinine Clearance (by C-G formula based on SCr of 1.16 mg/dL (H)) Female: 48.5 mL/min (A) Female: 59.3 mL/min (A) Liver Function Tests:  Recent Labs Lab 03/25/17 1139  AST 19  ALT 17  ALKPHOS 80  BILITOT 0.5  PROT 7.1  ALBUMIN 3.8   No results for input(s): LIPASE, AMYLASE in the last 168 hours. No results for input(s): AMMONIA in the last 168 hours. Coagulation profile No results for input(s): INR, PROTIME in the last 168 hours.  CBC:  Recent Labs Lab 03/25/17 1139 03/25/17 1646  WBC 9.3 8.1  NEUTROABS 5.4  --   HGB 13.4 12.4  HCT 39.6 37.5  MCV 85.5 85.8  PLT 240 231   Cardiac Enzymes:  Recent Labs Lab 03/25/17 1135 03/25/17 1646 03/25/17 2315  TROPONINI <0.03 <0.03 <0.03   BNP: Invalid input(s): POCBNP CBG: No results for input(s):  GLUCAP in the last 168 hours. D-Dimer No results for input(s): DDIMER in the last 72 hours. Hgb A1c No results for input(s): HGBA1C in the last 72 hours. Lipid Profile No results for input(s): CHOL, HDL, LDLCALC, TRIG, CHOLHDL, LDLDIRECT in the last 72 hours. Thyroid function studies No results for input(s): TSH, T4TOTAL, T3FREE, THYROIDAB in the last 72 hours.  Invalid input(s): FREET3 Anemia work up No results for input(s): VITAMINB12, FOLATE, FERRITIN, TIBC, IRON, RETICCTPCT in the last 72 hours. Microbiology No results found for this or any previous visit (from the past 240 hour(s)).   Discharge Instructions:   Discharge Instructions    Diet - low sodium heart healthy    Complete by:  As directed    Discharge instructions    Complete by:  As directed    GERD diet information attached   Increase activity slowly    Complete by:  As directed      Allergies as of 03/26/2017      Reactions   Zetia [ezetimibe] Other (See Comments)   Myalgia, paresthesias and weakness      Medication List    TAKE these medications   amLODipine 10 MG tablet Commonly known as:  NORVASC Take 1 tablet (10 mg total) by mouth daily.   aspirin EC 81 MG tablet Take 81 mg by mouth daily.   B-complex with vitamin C tablet Take 1 tablet by mouth daily.   calcitRIOL 0.25 MCG capsule Commonly known as:  ROCALTROL TAKE 3 CAPSULES(0.75 MCG) BY MOUTH DAILY   calcium carbonate 500 MG chewable tablet Commonly known as:  TUMS - dosed in mg elemental calcium Chew 4 tablets by mouth 2 (two) times daily.   cholecalciferol 1000 units tablet Commonly known as:  VITAMIN D Take 1,000 Units by mouth daily.   clopidogrel 75 MG tablet Commonly known as:  PLAVIX Take 1 tablet (75 mg total) by mouth daily.   famotidine 20 MG tablet Commonly known as:  PEPCID Take 1 tablet by mouth two  times daily   gabapentin 300 MG capsule Commonly known as:  NEURONTIN Take 1 capsule by mouth 3 (three) times daily  as needed.   ICY HOT EX Apply 1 patch topically daily.   isosorbide mononitrate 60 MG 24 hr tablet Commonly known as:  IMDUR TAKE 1 AND 1/2 TABLETS(90 MG) BY MOUTH DAILY   levothyroxine 100 MCG tablet Commonly known as:  SYNTHROID, LEVOTHROID TAKE 1 TABLET(100 MCG) BY MOUTH DAILY   methocarbamol 500 MG tablet Commonly known as:  ROBAXIN Take 2 tablets (1,000 mg total) by mouth every 8 (eight) hours as needed (shoulder pain).   multivitamin with minerals Tabs tablet Take 1 tablet by mouth daily.   nitroGLYCERIN 0.4 MG SL tablet Commonly known as:  NITROSTAT Dissolve 1 tablet under the tongue every 5  minutes as   needed for chest pain as   directed   oxyCODONE-acetaminophen 10-325 MG tablet Commonly known as:  PERCOCET Take 1 tablet by mouth 4 (four) times daily as needed.   pantoprazole 40 MG tablet Commonly known as:  PROTONIX Take 1 tablet (40 mg total) by mouth daily.   rosuvastatin 40 MG tablet Commonly known as:  CRESTOR TAKE 1 TABLET(40 MG) BY MOUTH DAILY      Follow-up Information    Penni Homans, MD Follow up in 1 week(s).   Specialty:  Family Medicine Why:  if diet changes do not help, may benefit from BID protonix Contact information: [address] [place] [address] [phone]            Time coordinating discharge: 25 min  Signed:  [facility] 03/26/2017, 10:54 AM

## 2017-03-27 ENCOUNTER — Other Ambulatory Visit: Payer: Self-pay

## 2017-03-27 ENCOUNTER — Telehealth: Payer: Self-pay

## 2017-03-27 ENCOUNTER — Other Ambulatory Visit: Payer: Self-pay | Admitting: Family Medicine

## 2017-03-27 MED ORDER — NITROGLYCERIN 0.4 MG SL SUBL: SUBLINGUAL_TABLET | SUBLINGUAL | 3 refills | Status: DC

## 2017-03-27 NOTE — Telephone Encounter (Signed)
03/27/17    Transition Care Management Follow-up Telephone Call  ADMISSION DATE: 03/25/17  DISCHARGE DATE: 03/26/17    How have you been since you were released from the hospital? Patient states she is feeling better since hospitalization..     Do you understand why you were in the hospital? Patient states she still wants to know the cause of her chest pain. She says she was told her heart looked good but not the cause of the chest pain.    Do you understand the discharge instrcutions?  Yes  Items Reviewed:  Medications reviewed:  Reviewed with patient.  Allergies reviewed: Reviewed with patient  Dietary changes reviewed:Low Sodium Heart Healthy  Referrals reviewed: Hospital follow up appointment scheduled.   Functional Questionnaire:   Activities of Daily Living (ADLs):  Some help needed at time but she lives alone. Any transportation issues/concerns?: States she drives when she has to. Calls taxi when she is unable to drive.   Any patient concerns? Would like explaination for chest pains because she has them off and on.   Confirmed importance and date/time of follow-up visits scheduled: Yes   Confirmed with patient if condition begins to worsen call PCP or go to the ER. Yes  Patient was given the [facility] line [phone]: Yes

## 2017-03-28 IMAGING — CR DG CHEST 2V
2 series · 2 of 2 positions shown · non-contrast
Comparison: Chest radiograph performed 09/26/2015

CLINICAL DATA: Acute onset of generalized chest pain and shortness
of breath. Initial encounter.

EXAM:
CHEST  2 VIEW

[chest pa]
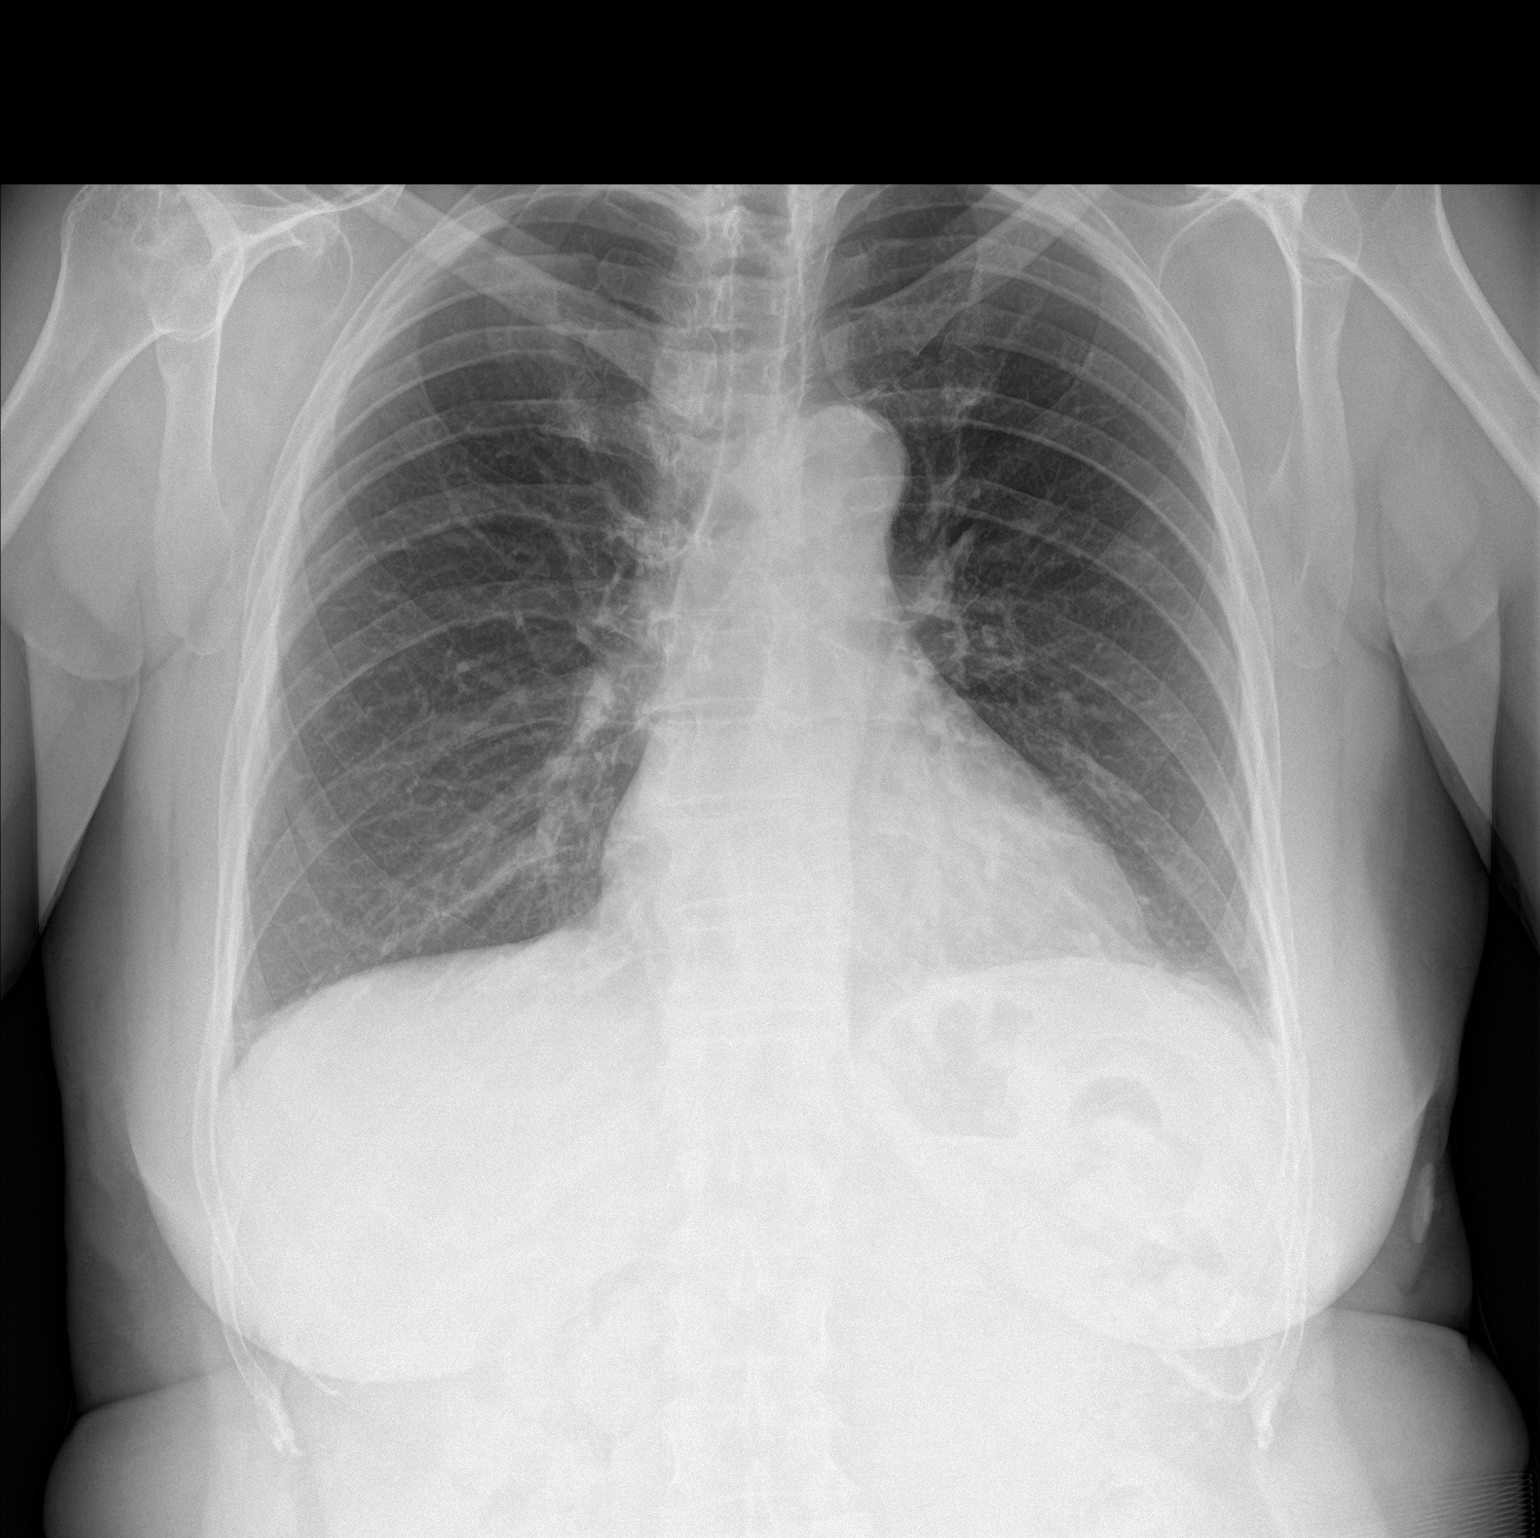

[chest lat]
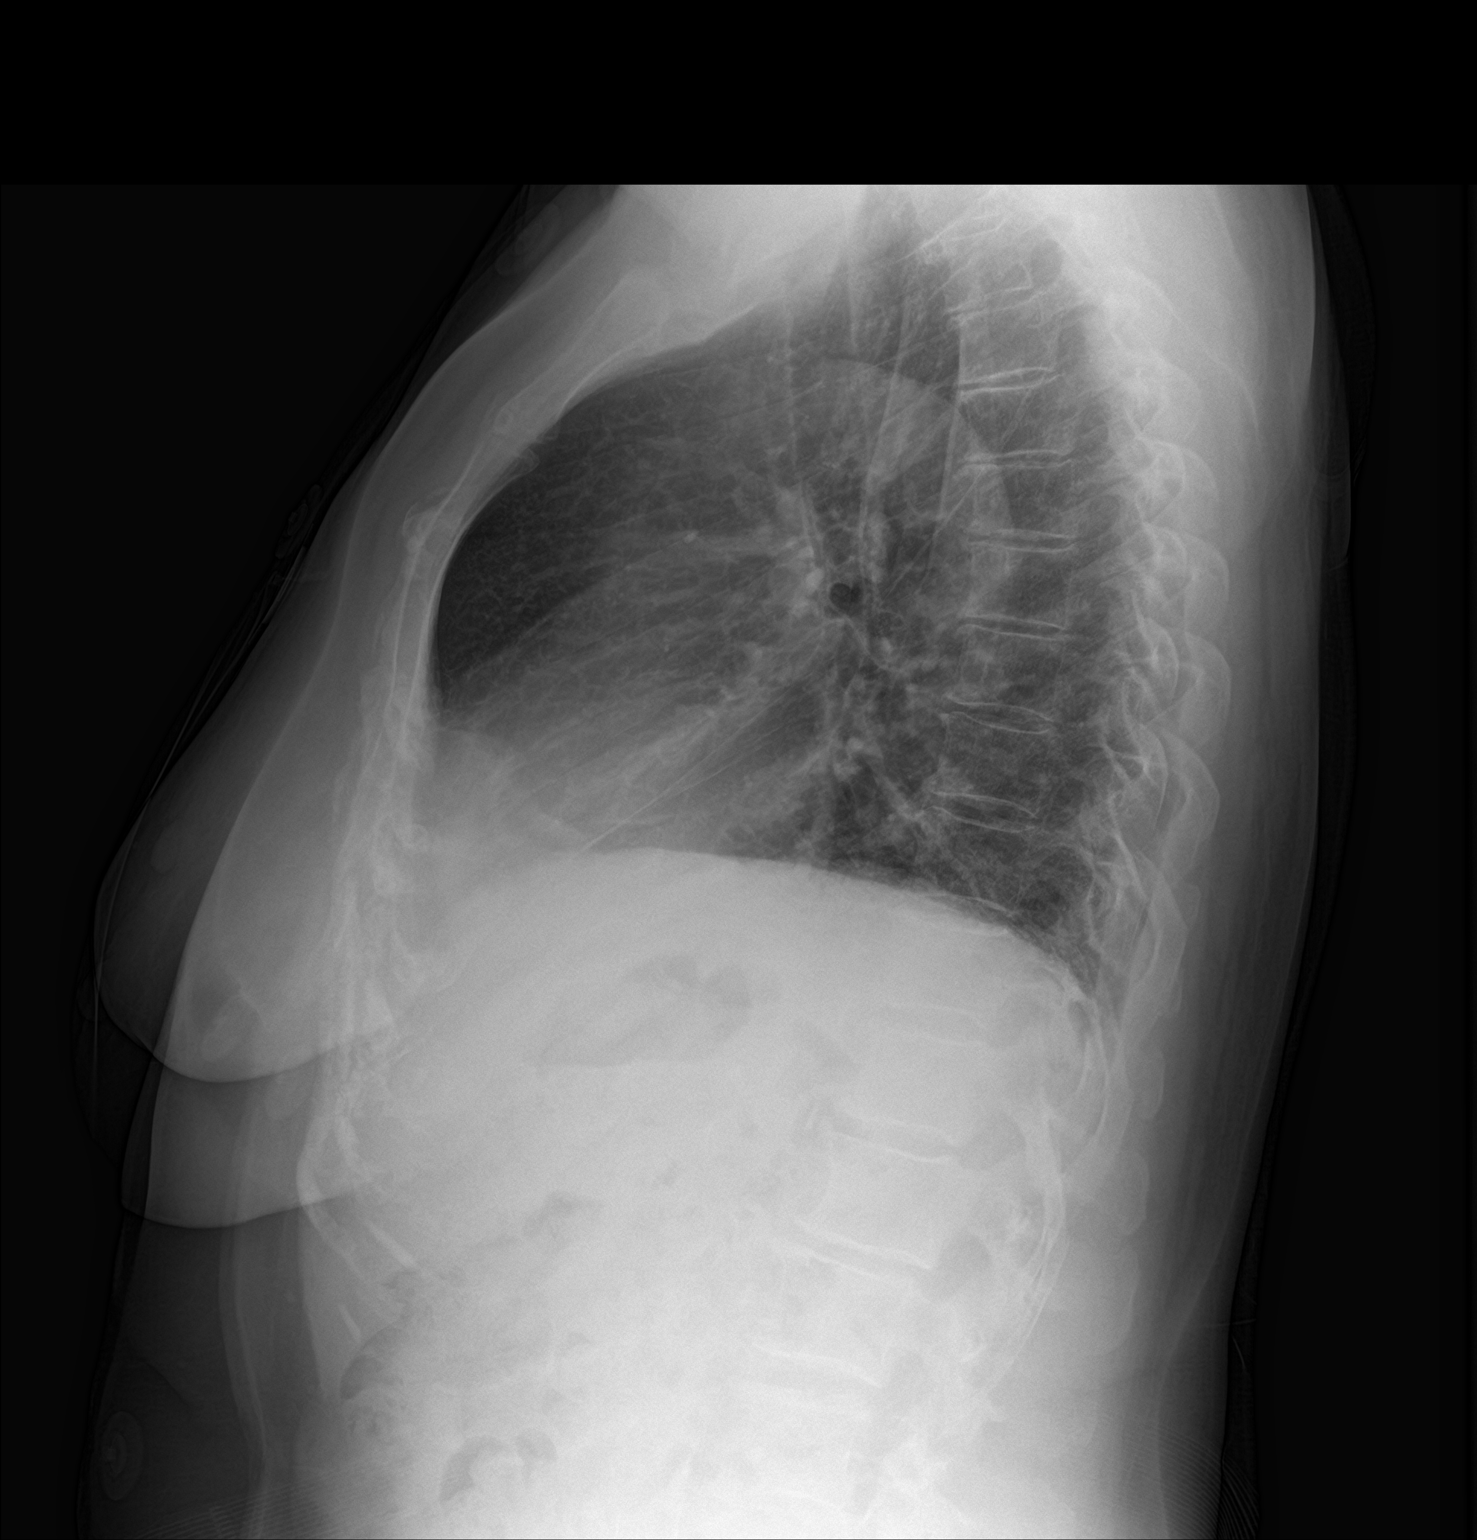

[2 of 2 positions shown; findings below may reference images not displayed]

FINDINGS: The lungs are well-aerated. Minimal bibasilar atelectasis is noted.
There is no evidence of pleural effusion or pneumothorax.

The heart is borderline normal in size. No acute osseous
abnormalities are seen.
IMPRESSION: Minimal bibasilar atelectasis noted.  Lungs otherwise clear.

## 2017-03-30 NOTE — Telephone Encounter (Signed)
She will be seeing you on 4/13 for hospital follow up.

## 2017-04-01 ENCOUNTER — Other Ambulatory Visit: Payer: Self-pay | Admitting: Family Medicine

## 2017-04-01 DIAGNOSIS — I10 Essential (primary) hypertension: Secondary | ICD-10-CM

## 2017-04-01 DIAGNOSIS — E876 Hypokalemia: Secondary | ICD-10-CM

## 2017-04-01 DIAGNOSIS — Z0000 Encounter for general adult medical examination without abnormal findings: Secondary | ICD-10-CM

## 2017-04-01 DIAGNOSIS — E039 Hypothyroidism, unspecified: Secondary | ICD-10-CM

## 2017-04-01 DIAGNOSIS — E60 Dietary zinc deficiency: Secondary | ICD-10-CM

## 2017-04-01 DIAGNOSIS — R1013 Epigastric pain: Secondary | ICD-10-CM

## 2017-04-01 DIAGNOSIS — E892 Postprocedural hypoparathyroidism: Secondary | ICD-10-CM

## 2017-04-04 ENCOUNTER — Inpatient Hospital Stay: Payer: Medicare Other | Admitting: Family Medicine

## 2017-04-05 ENCOUNTER — Ambulatory Visit (INDEPENDENT_AMBULATORY_CARE_PROVIDER_SITE_OTHER): Payer: Medicare Other | Admitting: Family Medicine

## 2017-04-05 ENCOUNTER — Other Ambulatory Visit: Payer: Medicare Other

## 2017-04-05 VITALS — BP 116/59 | HR 73 | Temp 98.9°F | Ht 64.0 in | Wt 198.0 lb

## 2017-04-05 DIAGNOSIS — K2100 Gastro-esophageal reflux disease with esophagitis, without bleeding: Secondary | ICD-10-CM

## 2017-04-05 DIAGNOSIS — Z09 Encounter for follow-up examination after completed treatment for conditions other than malignant neoplasm: Secondary | ICD-10-CM | POA: Diagnosis not present

## 2017-04-05 DIAGNOSIS — E039 Hypothyroidism, unspecified: Secondary | ICD-10-CM | POA: Diagnosis not present

## 2017-04-05 DIAGNOSIS — I10 Essential (primary) hypertension: Secondary | ICD-10-CM | POA: Diagnosis not present

## 2017-04-05 DIAGNOSIS — E785 Hyperlipidemia, unspecified: Secondary | ICD-10-CM | POA: Diagnosis not present

## 2017-04-05 LAB — CBC
HCT: 39.3 % (ref 36.0–46.0)
Hemoglobin: 13 g/dL (ref 12.0–15.0)
MCHC: 33.1 g/dL (ref 30.0–36.0)
MCV: 86.1 fl (ref 78.0–100.0)
PLATELETS: 249 10*3/uL (ref 150.0–400.0)
RBC: 4.57 Mil/uL (ref 3.87–5.11)
RDW: 14.9 % (ref 11.5–15.5)
WBC: 8.3 10*3/uL (ref 4.0–10.5)

## 2017-04-05 LAB — LIPID PANEL
Cholesterol: 171 mg/dL (ref 0–200)
HDL: 43.9 mg/dL (ref >39.00)
LDL Cholesterol: 89 mg/dL (ref 0–99)
NONHDL: 127.37
Total CHOL/HDL Ratio: 4
Triglycerides: 192 mg/dL — ABNORMAL HIGH (ref 0.0–149.0)
VLDL: 38.4 mg/dL (ref 0.0–40.0)

## 2017-04-05 LAB — COMPREHENSIVE METABOLIC PANEL
ALT: 16 U/L (ref 0–35)
AST: 15 U/L (ref 0–37)
Albumin: 3.9 g/dL (ref 3.5–5.2)
Alkaline Phosphatase: 76 U/L (ref 39–117)
BILIRUBIN TOTAL: 0.4 mg/dL (ref 0.2–1.2)
BUN: 28 mg/dL — AB (ref 6–23)
CALCIUM: 8.7 mg/dL (ref 8.4–10.5)
CHLORIDE: 104 meq/L (ref 96–112)
CO2: 30 meq/L (ref 19–32)
CREATININE: 1.21 mg/dL — AB (ref 0.40–1.20)
GFR: 46.74 mL/min — ABNORMAL LOW (ref >60.00)
Glucose, Bld: 116 mg/dL — ABNORMAL HIGH (ref 70–99)
Potassium: 4 mEq/L (ref 3.5–5.1)
SODIUM: 142 meq/L (ref 135–145)
Total Protein: 6.9 g/dL (ref 6.0–8.3)

## 2017-04-05 LAB — TSH: TSH: 1.11 u[IU]/mL (ref 0.35–4.50)

## 2017-04-05 IMAGING — CT CT ANGIO UP EXTREM RIGHT W/CM &/OR WO/CM
1 of 5 series · 12 of 35 positions shown · IV contrast (OMNI 350)
Comparison: None.

CLINICAL DATA: Extreme right arm pain after heart catheterization
on [REDACTED]. Limited range of motion.

EXAM:
CT ANGIOGRAPHY OF THE right upperEXTREMITY
TECHNIQUE: Multidetector CT imaging of the right upperwas performed using the
standard protocol during bolus administration of intravenous
contrast. Multiplanar CT image reconstructions and MIPs were
obtained to evaluate the vascular anatomy.
CONTRAST:  100 mL Omnipaque 350

[Series 6: cta rt arm (id) · axial · 0.59mm/px · z∈[+130,+758]mm · 12 of 249 slices shown]
[im 20/249  soft-tissue]
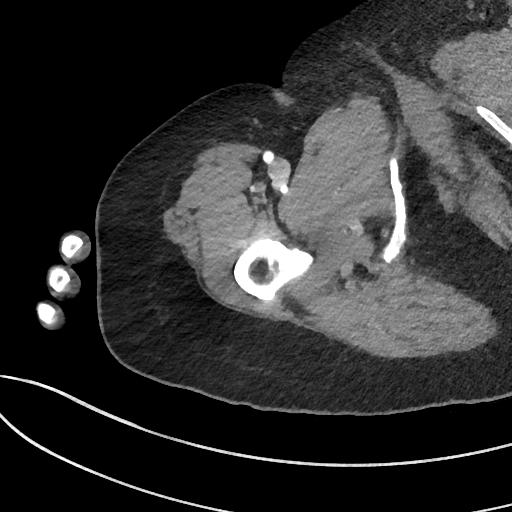
[im 39/249  bone]
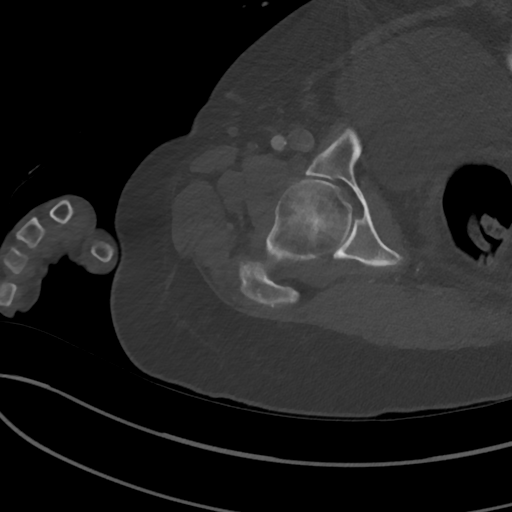
[im 58/249  soft-tissue]
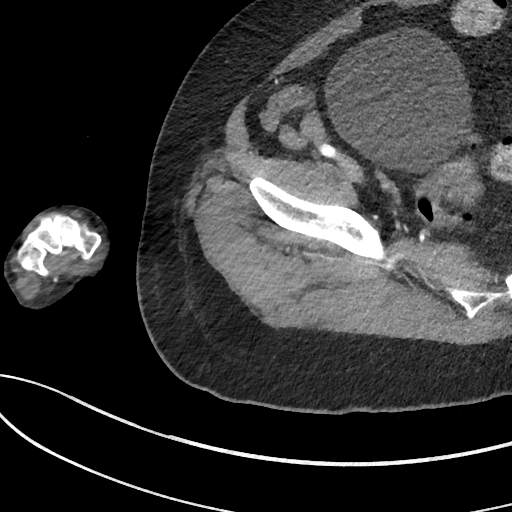
[im 77/249  bone]
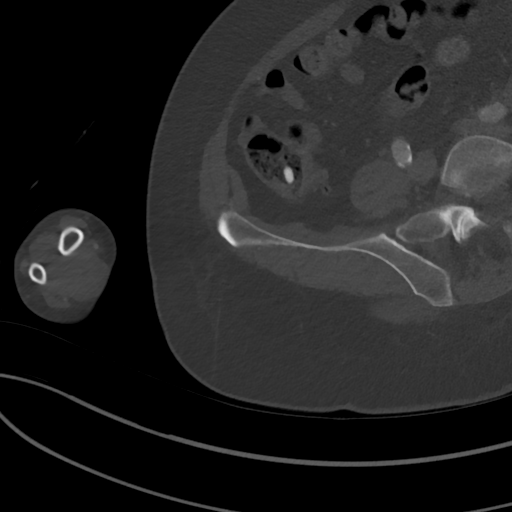
[im 96/249  soft-tissue]
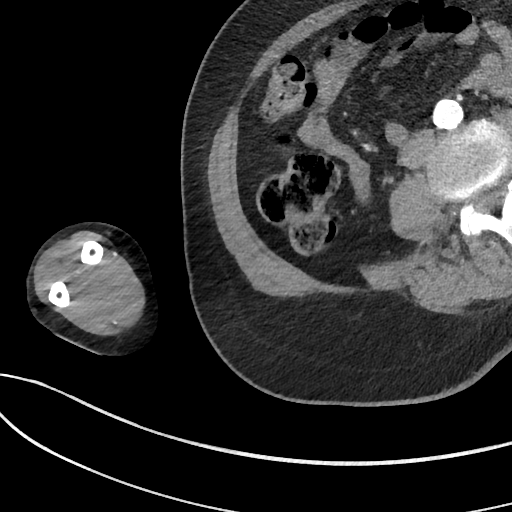
[im 115/249  bone]
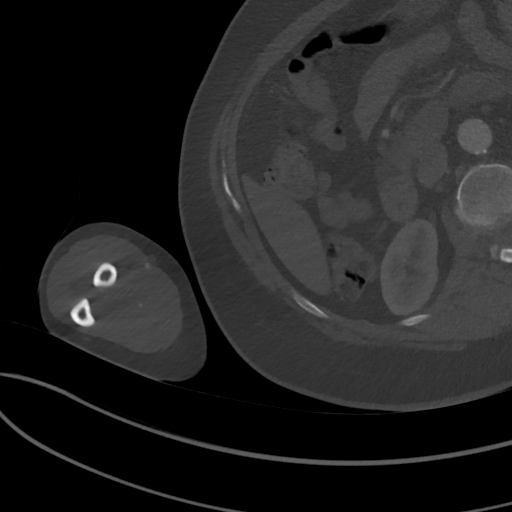
[im 134/249  soft-tissue]
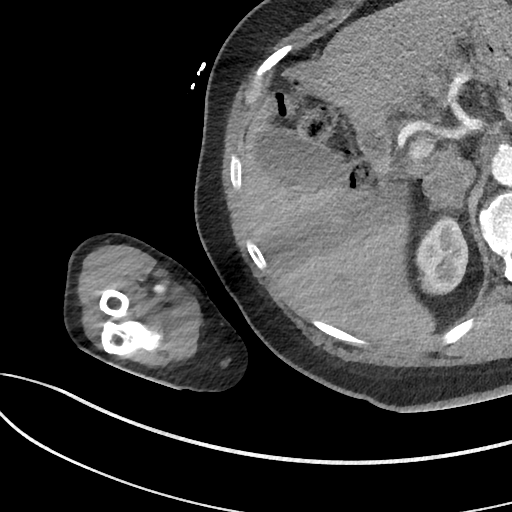
[im 153/249  bone]
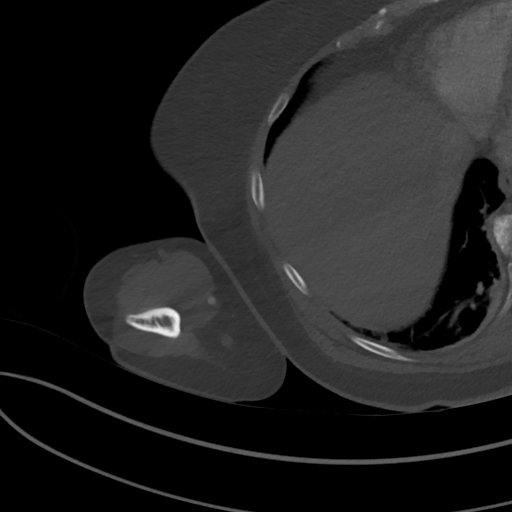
[im 172/249  soft-tissue]
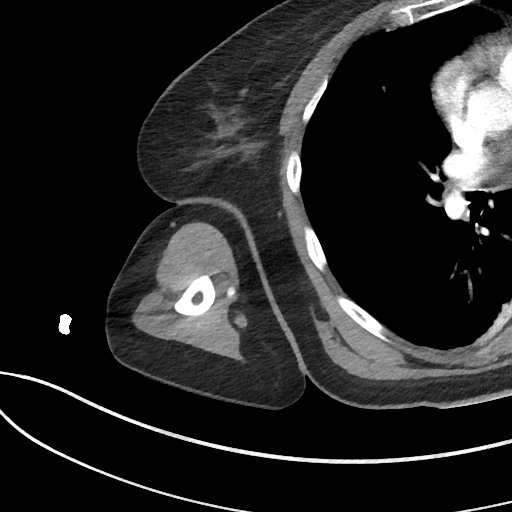
[im 191/249  bone]
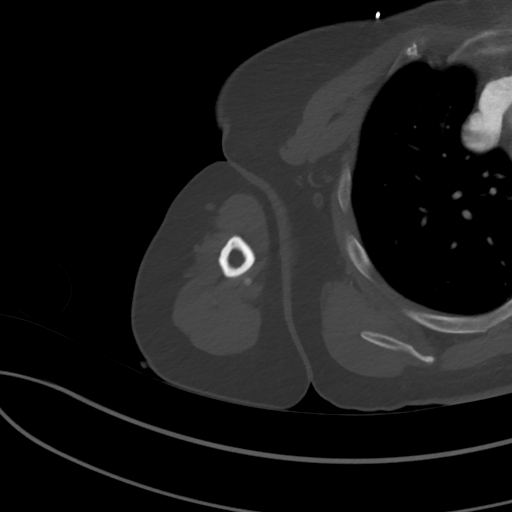
[im 210/249  soft-tissue]
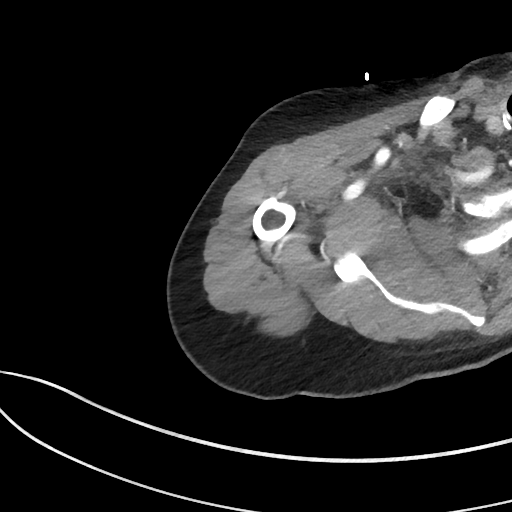
[im 229/249  bone]
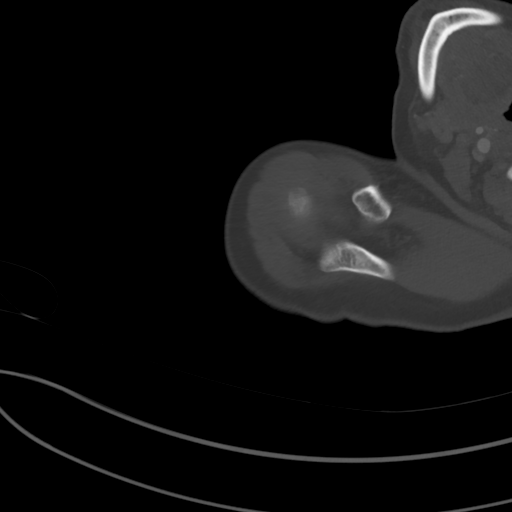

[12 of 35 positions shown; findings below may reference images not displayed]

FINDINGS: The right subclavian artery, axillary artery, brachial artery, and
radial and ulnar arteries appear patent to the level of the right
wrist. No evidence of aneurysm, dissection, or focal occlusion. No
evidence of contrast extravasation or hematoma. No abnormal
enhancing lesions are demonstrated. Muscle and soft tissue
compartments appear intact. Degenerative changes are present in the
right shoulder. No evidence of acute fracture or dislocation in the
right humerus, radius, or ulna. No focal bone lesion or bone
destruction.

Included portions of the chest, abdomen, and pelvis are grossly
unremarkable. There is mild atelectasis in the right lung base.
Calcification in the abdominal aorta and iliac arteries.

Review of the MIP images confirms the above findings.
IMPRESSION: No evidence of significant arterial occlusion or contrast
extravasation demonstrated in the right upper extremity.

## 2017-04-05 NOTE — Patient Instructions (Signed)
Continue Protonix.   Call your surgeon's office regarding your study on your leg.

## 2017-04-05 NOTE — Progress Notes (Signed)
Chief Complaint  Patient presents with  . Hospitalization Follow-up    HPI [name] is a 71 y.o. y.o. female who presents for a transition of care visit.  Pt was discharged from [facility] on 03/26/17.  Within 48 business hours of discharge our office contacted her via telephone to coordinate her care and needs.   Pt presented to ED on 03/25/17 for intermittent chest pain and was admitted for observation at [facility]. Troponin trended and were all undetectable. The patient is on Protonix as it was thought to be a gastrointestinal issue. She has been pain-free since this issue has started. She's not having any exertional symptoms and is breathing normally. Denies any fevers, cough, palpitations, abdominal pain, nausea, vomiting, bowel changes, urinary complaints, vision changes, or swelling in her legs.  Social History   Social History  . Marital status: Married  . Number of children: 4  . Years of education: 14   Occupational History  . Sales person at [employer]  . Smoking status: Never Smoker  . Smokeless tobacco: Never Used  . Alcohol use No  . Drug use: No   Social History Narrative   Lives at home alone.  Her son lives near her.   Right-handed.   1 cup coffee per day.   Past Medical History:  Diagnosis Date  . Acute MI, subendocardial ([place])   . Back pain 05/31/2013  . Benign paroxysmal positional vertigo 08/05/2016  . CAD (coronary artery disease)    a. NSTEMI 10/2011: [place] 11/19/11: pLAD 30%, oOM 60%, AVCFX 30%, CFX after OM2 30%, pRCA 60 and 70%, then 99%, AM 80-90% with TIMI 3 flow.  PCI: Promus DES x 2 to RCA; b. 06/2012 Cath: patent RCA stents w/ subtl occl of Acute Marginal (jailed)->Med rx; c. 05/2015 MV: EF 59%, mod mid infsept/inf/ap lat/ap infarct with peri-infarct isch-->Med Rx; d. 02/2016 Cath: LM nl, LAD 11m, RI 50, RCA patent stents.  . Colitis   . Facial skin lesion 01/28/2017  . GERD (gastroesophageal reflux disease)     occasional  . Hyperlipidemia   . Hypertension   . Hypertensive heart disease    a. Echocardiogram 11/19/11: Difficult acoustic windows, EF 60-65%, normal LV wall thickness, grade 1 diastolic dysfunction  . Hypoparathyroidism ([place])   . Low zinc level 11/26/2016  . Multinodular thyroid    Goiter s/p thyroidectomy in 2007 with post-op  hypocalcemia and post-op hypothyroidism/hypoparathyroidism with hypocalcemia  . Neck pain on right side 07/13/2013  . Nocturia 05/31/2013  . Pain in right axilla 03/13/2016  . Palpitations    a. 03/2012 - patient set up for event monitor but did not wear correctly -  she declined wearing a repeat monitor  . Post-surgical hypothyroidism   . Sun-damaged skin 12/05/2014  . Tubular adenoma of colon 06/2011  . Unspecified constipation 05/31/2013  . Vertigo   . Zinc deficiency 11/26/2015   Family History  Problem Relation Age of Onset  . Colon cancer Brother   . Cancer Brother     COLON  . Hypertension Father   . Heart disease Father   . Heart attack Father   . Blindness Sister   . Congestive Heart Failure Sister   . Hypertension Sister   . Thyroid disease Sister   . Cancer Brother     multiple myelomas  . Diabetes Neg Hx   . Prostate cancer Neg Hx   . Breast cancer Neg Hx  Allergies as of 04/05/2017      Reactions   Zetia [ezetimibe] Other (See Comments)   Myalgia, paresthesias and weakness      Medication List       Accurate as of 04/05/17 11:41 AM. Always use your most recent med list.          amLODipine 10 MG tablet Commonly known as:  NORVASC TAKE 1 TABLET(10 MG) BY MOUTH DAILY   aspirin EC 81 MG tablet Take 81 mg by mouth daily.   B-complex with vitamin C tablet Take 1 tablet by mouth daily.   calcitRIOL 0.25 MCG capsule Commonly known as:  ROCALTROL TAKE 3 CAPSULES(0.75 MCG) BY MOUTH DAILY   calcium carbonate 500 MG chewable tablet Commonly known as:  TUMS - dosed in mg elemental calcium Chew 4 tablets by mouth 2 (two) times  daily.   cholecalciferol 1000 units tablet Commonly known as:  VITAMIN D Take 1,000 Units by mouth daily.   clopidogrel 75 MG tablet Commonly known as:  PLAVIX TAKE 1 TABLET BY MOUTH EVERY DAY   famotidine 20 MG tablet Commonly known as:  PEPCID Take 1 tablet by mouth two  times daily   gabapentin 300 MG capsule Commonly known as:  NEURONTIN Take 1 capsule by mouth 3 (three) times daily as needed.   ICY HOT EX Apply 1 patch topically daily.   isosorbide mononitrate 60 MG 24 hr tablet Commonly known as:  IMDUR TAKE 1 AND 1/2 TABLETS(90 MG) BY MOUTH DAILY   levothyroxine 100 MCG tablet Commonly known as:  SYNTHROID, LEVOTHROID TAKE 1 TABLET(100 MCG) BY MOUTH DAILY   methocarbamol 500 MG tablet Commonly known as:  ROBAXIN Take 2 tablets (1,000 mg total) by mouth every 8 (eight) hours as needed (shoulder pain).   multivitamin with minerals Tabs tablet Take 1 tablet by mouth daily.   nitroGLYCERIN 0.4 MG SL tablet Commonly known as:  NITROSTAT DISSOLVE 1 TABLET UNDER THE TONGUE EVERY 5 MINUTES AS NEEDED FOR CHEST PAIN AS DIRECTED   oxyCODONE-acetaminophen 10-325 MG tablet Commonly known as:  PERCOCET Take 1 tablet by mouth 4 (four) times daily as needed.   pantoprazole 40 MG tablet Commonly known as:  PROTONIX Take 1 tablet (40 mg total) by mouth daily.   rosuvastatin 40 MG tablet Commonly known as:  CRESTOR TAKE 1 TABLET(40 MG) BY MOUTH DAILY       ROS:  Constitutional: No fevers or chills, no weight loss HEENT: No headaches, hearing loss, or runny nose, no sore throat Heart: No chest pain Lungs: No SOB, no cough Abd: No bowel changes, no pain, no N/V GU: No urinary complaints Neuro: No numbness, tingling or weakness Msk: No joint pain, +RLE pain (has been worked up for this)  Objective BP (!) 116/59   Pulse 73   Temp 98.9 F (37.2 C) (Oral)   Ht 5\' 4"  (1.626 m)   Wt 198 lb (89.8 kg)   SpO2 97%   BMI 33.99 kg/m  General Appearance:  awake, alert,  oriented, in no acute distress and well developed, well nourished Skin:  there are no suspicious lesions or rashes of concern Head/face:  NCAT Eyes:  EOMI, PERRLA Ears:  canals and TMs NI Nose/Sinuses:  negative Mouth/Throat:  Mucosa moist, no lesions; pharynx without erythema, edema or exudate. Neck:  neck- supple, no mass, non-tender and no jvd Lungs: Clear to auscultation.  No rales, rhonchi, or wheezing. Normal effort, no accessory muscle use. Heart:  Heart sounds are normal.  Regular rate and rhythm without murmur, gallop or rub. No bruits. Abdomen:  BS+, soft, NT, ND, no masses or organomegaly Musculoskeletal:  No muscle group atrophy or asymmetry, gait normal Neurologic:  Alert and oriented x 3, gait normal., reflexes normal and symmetric, strength and  sensation grossly normal Psych exam: Nml mood and affect, age appropriate judgment and insight  Hospital discharge follow-up  Discharge summary and medication list have been reviewed/reconciled.  Labs pending at the time of discharge have been reviewed or are still pending at the time of this visit.  Follow-up labs and appointments have been ordered and/or coordinated appropriately. Educational materials regarding the patient's admitting diagnosis provided.  TRANSITIONAL CARE MANAGEMENT CERTIFICATION:  I certify the following are true:   1. Communication with the patient/care giver was made within 2 business days of discharge.  2. Complexity of Medical decision making is moderate. 3. Face to face visit occurred within 14 days of discharge.   Doing well post discharge. Continue GERD healthy diet. OK to try to come off of Protonix, but patient would like to stay on it for now. Will obtain previously ordered labs by reg PCP today. F/u as originally scheduled with reg PCP. The patient voiced understanding and agreement to the plan.  Loyal, DO 04/05/17 11:41 AM

## 2017-04-08 ENCOUNTER — Other Ambulatory Visit: Payer: Medicare Other

## 2017-04-09 LAB — ZINC: ZINC: 62 ug/dL (ref 60–130)

## 2017-04-11 ENCOUNTER — Ambulatory Visit: Payer: Medicare Other | Admitting: Family Medicine

## 2017-09-09 ENCOUNTER — Encounter: Payer: Medicare Other | Admitting: Family Medicine

## 2017-09-11 ENCOUNTER — Telehealth: Payer: Self-pay | Admitting: Family Medicine

## 2017-09-11 NOTE — Telephone Encounter (Signed)
Attempted to call pt to schedule AWV. Pt did not answer, could not leave msg because vm is full.

## 2017-10-04 ENCOUNTER — Other Ambulatory Visit: Payer: Self-pay | Admitting: Family Medicine

## 2017-10-04 DIAGNOSIS — Z0000 Encounter for general adult medical examination without abnormal findings: Secondary | ICD-10-CM

## 2017-10-04 DIAGNOSIS — E60 Dietary zinc deficiency: Secondary | ICD-10-CM

## 2017-10-04 DIAGNOSIS — E876 Hypokalemia: Secondary | ICD-10-CM

## 2017-10-04 DIAGNOSIS — R1013 Epigastric pain: Secondary | ICD-10-CM

## 2017-10-04 DIAGNOSIS — E039 Hypothyroidism, unspecified: Secondary | ICD-10-CM

## 2017-10-04 DIAGNOSIS — I10 Essential (primary) hypertension: Secondary | ICD-10-CM

## 2017-10-04 DIAGNOSIS — E892 Postprocedural hypoparathyroidism: Secondary | ICD-10-CM

## 2017-10-07 ENCOUNTER — Other Ambulatory Visit: Payer: Self-pay | Admitting: Family Medicine

## 2017-10-07 DIAGNOSIS — Z1231 Encounter for screening mammogram for malignant neoplasm of breast: Secondary | ICD-10-CM

## 2017-10-08 ENCOUNTER — Encounter (HOSPITAL_BASED_OUTPATIENT_CLINIC_OR_DEPARTMENT_OTHER): Payer: Self-pay

## 2017-10-08 ENCOUNTER — Ambulatory Visit (HOSPITAL_BASED_OUTPATIENT_CLINIC_OR_DEPARTMENT_OTHER)
Admission: RE | Admit: 2017-10-08 | Discharge: 2017-10-08 | Disposition: A | Discharge status: Home / Self Care | Payer: Medicare Other | Source: Ambulatory Visit | Attending: Family Medicine | Admitting: Family Medicine

## 2017-10-08 DIAGNOSIS — Z1231 Encounter for screening mammogram for malignant neoplasm of breast: Secondary | ICD-10-CM | POA: Insufficient documentation

## 2017-10-09 ENCOUNTER — Other Ambulatory Visit: Payer: Self-pay | Admitting: Family Medicine

## 2017-10-09 DIAGNOSIS — E039 Hypothyroidism, unspecified: Secondary | ICD-10-CM

## 2017-10-09 DIAGNOSIS — E60 Dietary zinc deficiency: Secondary | ICD-10-CM

## 2017-10-09 DIAGNOSIS — E876 Hypokalemia: Secondary | ICD-10-CM

## 2017-10-09 DIAGNOSIS — R1013 Epigastric pain: Secondary | ICD-10-CM

## 2017-10-09 DIAGNOSIS — E892 Postprocedural hypoparathyroidism: Secondary | ICD-10-CM

## 2017-10-09 DIAGNOSIS — Z0000 Encounter for general adult medical examination without abnormal findings: Secondary | ICD-10-CM

## 2017-10-09 DIAGNOSIS — I10 Essential (primary) hypertension: Secondary | ICD-10-CM

## 2017-11-01 ENCOUNTER — Telehealth: Payer: Self-pay | Admitting: Internal Medicine

## 2017-11-01 NOTE — Telephone Encounter (Signed)
Spoke with pharmacist and then patient.  She was sick in [place], stayed overnight in hospital and went home with bisoprolol.  She has taken it for 2 months and has no more.  Has prescription for refill but it is [place].   There are no appointments on Monday at this time.  Advised patient I will call her Monday AM to arrange a time to come in after discussing with Dr. Harrington Challenger.

## 2017-11-01 NOTE — Telephone Encounter (Signed)
°*  STAT* If patient is at the pharmacy, call can be transferred to refill team.   1. Which medications need to be refilled? (please list name of each medication and dose if known) unknown   2. Which pharmacy/location (including street and city if local pharmacy) is medication to be sent to? [facility] [place] 3. Do they need a 30 day or 90 day supply? [place]

## 2017-11-01 NOTE — Telephone Encounter (Signed)
F/U Call:  Shanon Brow calling with [facility], states that patient has a prescription from [place] that she would like to have refilled here. The prescription is in foreign language and the only thing Shanon Brow could read off the bottle was bisoprolol and he tried to explain that we cannot fill prescription from [place].

## 2017-11-04 ENCOUNTER — Encounter: Payer: Self-pay | Admitting: Internal Medicine

## 2017-11-04 ENCOUNTER — Ambulatory Visit (INDEPENDENT_AMBULATORY_CARE_PROVIDER_SITE_OTHER): Payer: Medicare Other | Admitting: Internal Medicine

## 2017-11-04 VITALS — BP 140/60 | HR 65 | Ht 64.0 in | Wt 200.8 lb

## 2017-11-04 DIAGNOSIS — E782 Mixed hyperlipidemia: Secondary | ICD-10-CM | POA: Diagnosis not present

## 2017-11-04 DIAGNOSIS — I739 Peripheral vascular disease, unspecified: Secondary | ICD-10-CM

## 2017-11-04 DIAGNOSIS — I2510 Atherosclerotic heart disease of native coronary artery without angina pectoris: Secondary | ICD-10-CM

## 2017-11-04 LAB — BASIC METABOLIC PANEL
BUN / CREAT RATIO: 21 (ref 12–28)
BUN: 20 mg/dL (ref 8–27)
CALCIUM: 8.4 mg/dL — AB (ref 8.7–10.3)
CO2: 26 mmol/L (ref 20–29)
CREATININE: 0.96 mg/dL (ref 0.57–1.00)
Chloride: 103 mmol/L (ref 96–106)
GFR calc non Af Amer: 60 mL/min/{1.73_m2} (ref >59)
GFR, EST AFRICAN AMERICAN: 69 mL/min/{1.73_m2} (ref >59)
Glucose: 77 mg/dL (ref 65–99)
Potassium: 4.2 mmol/L (ref 3.5–5.2)
Sodium: 145 mmol/L — ABNORMAL HIGH (ref 134–144)

## 2017-11-04 LAB — LIPID PANEL
CHOLESTEROL TOTAL: 172 mg/dL (ref 100–199)
Chol/HDL Ratio: 3.7 ratio (ref 0.0–4.4)
HDL: 46 mg/dL (ref >39)
LDL Calculated: 100 mg/dL — ABNORMAL HIGH (ref 0–99)
Triglycerides: 129 mg/dL (ref 0–149)
VLDL Cholesterol Cal: 26 mg/dL (ref 5–40)

## 2017-11-04 LAB — CBC
HEMATOCRIT: 39.1 % (ref 34.0–46.6)
HEMOGLOBIN: 13.3 g/dL (ref 11.1–15.9)
MCH: 28 pg (ref 26.6–33.0)
MCHC: 34 g/dL (ref 31.5–35.7)
MCV: 82 fL (ref 79–97)
Platelets: 259 10*3/uL (ref 150–379)
RBC: 4.75 x10E6/uL (ref 3.77–5.28)
RDW: 15 % (ref 12.3–15.4)
WBC: 9.1 10*3/uL (ref 3.4–10.8)

## 2017-11-04 MED ORDER — BISOPROLOL FUMARATE 5 MG PO TABS: 2.5000 mg | ORAL_TABLET | Freq: Every day | ORAL | 3 refills | Status: DC

## 2017-11-04 NOTE — Telephone Encounter (Signed)
Called patient and advised to come to office this AM.  Advised she may have to wait.  appt scheduled for 10:00 am.

## 2017-11-04 NOTE — Patient Instructions (Addendum)
Your physician recommends that you continue on your current medications as directed. Please refer to the Current Medication list given to you today.  Your physician recommends that you return for lab work today (LIPIDS, CBC, BMET)

## 2017-11-04 NOTE — Progress Notes (Signed)
Cardiology Office Note   Date:  11/04/2017   ID:  [name], DOB 07-30-1946, MRN [id number]  PCP:  Mosie Lukes, MD  Cardiologist:   Dorris Carnes, MD    F/U of CAD     History of Present Illness: [name] is a 71 y.o. adult with a history of CAD - s/p NSTEMI 10/2011 with DESx2 to RCA and normal EF. She underwent repeat LHC on 07/17/12 Patent RCA stent LCA with collaterals to R acute marginal were unchanged. Subtotal occlusion of acute marginal (exits mid stent). Recomm for medical Rx at that time. Other issues include thyroidectomy, GERD, HTN, HLD, vertigo.Last myoviue showed inferolateral scar with minimal periinfarct ischemia I saw the pt in Jan 2018     Pt called in  She was sick while in [place].   Had CP  R/O for MI  EKGs negative  Seen by Cardiology  Recm she increase b blockade  Add bisoprolol PT said it was really her heart pounding that took her into the hospital    Since seen and disharged feels fine  No booming   Breathing OK  SOmetimes feels like a little wave  But true dizziness  Has seen by eye person for vision changes  No CP    Got back to [place] 2 wks ago   Current Meds  Medication Sig  . amLODipine (NORVASC) 10 MG tablet TAKE 1 TABLET(10 MG) BY MOUTH DAILY  . aspirin EC 81 MG tablet Take 81 mg by mouth daily.    . B Complex-C (B-COMPLEX WITH VITAMIN C) tablet Take 1 tablet by mouth daily.  . bisoprolol (ZEBETA) 5 MG tablet Take 2.5 mg daily by mouth.  . calcitRIOL (ROCALTROL) 0.25 MCG capsule TAKE 3 CAPSULES(0.75 MCG) BY MOUTH DAILY  . calcium carbonate (TUMS - DOSED IN MG ELEMENTAL CALCIUM) 500 MG chewable tablet Chew 4 tablets by mouth 2 (two) times daily.   . cholecalciferol (VITAMIN D) 1000 UNITS tablet Take 1,000 Units by mouth daily.  . clopidogrel (PLAVIX) 75 MG tablet TAKE 1 TABLET BY MOUTH EVERY DAY  . famotidine (PEPCID) 20 MG tablet Take 1 tablet by mouth two  times daily  . gabapentin (NEURONTIN) 300 MG capsule Take 1 capsule by mouth  3 (three) times daily as needed.  . isosorbide mononitrate (IMDUR) 60 MG 24 hr tablet TAKE 1 AND 1/2 TABLETS(90 MG) BY MOUTH DAILY  . levothyroxine (SYNTHROID, LEVOTHROID) 100 MCG tablet TAKE 1 TABLET(100 MCG) BY MOUTH DAILY  . Menthol, Topical Analgesic, (ICY HOT EX) Apply 1 patch topically daily.  . methocarbamol (ROBAXIN) 500 MG tablet Take 2 tablets (1,000 mg total) by mouth every 8 (eight) hours as needed (shoulder pain).  . Multiple Vitamin (MULTIVITAMIN WITH MINERALS) TABS tablet Take 1 tablet by mouth daily.  . nitroGLYCERIN (NITROSTAT) 0.4 MG SL tablet DISSOLVE 1 TABLET UNDER THE TONGUE EVERY 5 MINUTES AS NEEDED FOR CHEST PAIN AS DIRECTED  . oxyCODONE-acetaminophen (PERCOCET) 10-325 MG tablet Take 1 tablet by mouth 4 (four) times daily as needed.  . pantoprazole (PROTONIX) 40 MG tablet Take 1 tablet (40 mg total) by mouth daily.  . rosuvastatin (CRESTOR) 40 MG tablet TAKE 1 TABLET(40 MG) BY MOUTH DAILY     Allergies:   Zetia [ezetimibe]   Past Medical History:  Diagnosis Date  . Acute MI, subendocardial ([place])   . Back pain 05/31/2013  . Benign paroxysmal positional vertigo 08/05/2016  . CAD (coronary artery disease)    a. NSTEMI 10/2011:  LHC 11/19/11: pLAD 30%, oOM 60%, AVCFX 30%, CFX after OM2 30%, pRCA 60 and 70%, then 99%, AM 80-90% with TIMI 3 flow.  PCI: Promus DES x 2 to RCA; b. 06/2012 Cath: patent RCA stents w/ subtl occl of Acute Marginal (jailed)->Med rx; c. 05/2015 MV: EF 59%, mod mid infsept/inf/ap lat/ap infarct with peri-infarct isch-->Med Rx; d. 02/2016 Cath: LM nl, LAD 16m, RI 50, RCA patent stents.  . Colitis   . Facial skin lesion 01/28/2017  . GERD (gastroesophageal reflux disease)    occasional  . Hyperlipidemia   . Hypertension   . Hypertensive heart disease    a. Echocardiogram 11/19/11: Difficult acoustic windows, EF 60-65%, normal LV wall thickness, grade 1 diastolic dysfunction  . Hypoparathyroidism ([place])   . Low zinc level 11/26/2016  . Multinodular thyroid      Goiter s/p thyroidectomy in 2007 with post-op  hypocalcemia and post-op hypothyroidism/hypoparathyroidism with hypocalcemia  . Neck pain on right side 07/13/2013  . Nocturia 05/31/2013  . Pain in right axilla 03/13/2016  . Palpitations    a. 03/2012 - patient set up for event monitor but did not wear correctly -  she declined wearing a repeat monitor  . Post-surgical hypothyroidism   . Sun-damaged skin 12/05/2014  . Tubular adenoma of colon 06/2011  . Unspecified constipation 05/31/2013  . Vertigo   . Zinc deficiency 11/26/2015    Past Surgical History:  Procedure Laterality Date  . COLONOSCOPY  Aenomatous polyps   07/05/2011  . CORONARY ANGIOPLASTY WITH STENT PLACEMENT    . SHOULDER ARTHROSCOPY W/ ROTATOR CUFF REPAIR     right  . TOTAL THYROIDECTOMY  2007   GOITER     Social History:  The patient  reports that  has never smoked. she has never used smokeless tobacco. She reports that she does not drink alcohol or use drugs.   Family History:  The patient's family history includes Blindness in her sister; Cancer in her brother and brother; Colon cancer in her brother; Congestive Heart Failure in her sister; Heart attack in her father; Heart disease in her father; Hypertension in her father and sister; Thyroid disease in her sister.    ROS:  Please see the history of present illness. All other systems are reviewed and  Negative to the above problem except as noted.    PHYSICAL EXAM: VS:  BP 140/60   Pulse 65   Ht 5\' 4"  (1.626 m)   Wt 200 lb 12.8 oz (91.1 kg)   SpO2 96%   BMI 34.47 kg/m   GEN: Well nourished, well developed, in no acute distress  HEENT: normal  Neck:  JVP is normal   Cardiac: RRR; no murmurs, rubs, or gallops,no edema     Tr PT pulses   Respiratory:  clear to auscultation bilaterally, normal work of breathing GI: soft, nontender, nondistended, + BS  No hepatomegaly  MS: no deformity Moving all extremities   Skin: warm and dry, no rash Neuro:  Strength and  sensation are intact Psych: euthymic mood, full affect   EKG:  EKG is not ordered today.   Lipid Panel    Component Value Date/Time   CHOL 171 04/05/2017 1206   TRIG 192.0 (H) 04/05/2017 1206   HDL 43.90 04/05/2017 1206   CHOLHDL 4 04/05/2017 1206   VLDL 38.4 04/05/2017 1206   LDLCALC 89 04/05/2017 1206   LDLDIRECT 83.8 08/11/2012 1203      Wt Readings from Last 3 Encounters:  11/04/17 200 lb 12.8  oz (91.1 kg)  04/05/17 198 lb (89.8 kg)  03/25/17 194 lb 9.6 oz (88.3 kg)      ASSESSMENT AND PLAN:  1  CAD  Doing good  Continue meds including bisoprolol    2.  Hx GERD  Pt asymptomatic  3  HL  Will get lipids today      4  Ophthy   Continues to have some intermitt visuall changes  Sees Dr Bing Plume    5  CV dz  Conitnue on statin    6  PVOD  Asymptomatic      Keep appt in Jan     Current medicines are reviewed at length with the patient today.  The patient does not have concerns regarding medicines.  Signed, Dorris Carnes, MD  11/04/2017 10:33 AM    [facility] [address], [place], [place]  [address] Phone: [phone]; Fax: [phone]

## 2017-11-26 NOTE — Progress Notes (Deleted)
Subjective:   [name] is a 71 y.o. female who presents for Medicare Annual (Subsequent) preventive examination.  Review of Systems:  No ROS.  Medicare Wellness Visit. Additional risk factors are reflected in the social history.    Sleep patterns: Home Safety/Smoke Alarms: Feels safe in home. Smoke alarms in place.   Female:   Pap-       Mammo-   Last 10/08/17-normal    Dexa scan-  Last 05/07/16: normal      CCS- last 09/28/14- recall 5 yrs    Objective:     Vitals: There were no vitals taken for this visit.  There is no height or weight on file to calculate BMI.  Advanced Directives 03/25/2017 06/28/2016 06/13/2016 03/15/2016 03/08/2016 03/07/2016 09/26/2015  Does Patient Have a Medical Advance Directive? No No No No No No No  Would patient like information on creating a medical advance directive? No - Patient declined Yes Higher education careers adviser given - No - patient declined information No - patient declined information - No - patient declined information  Pre-existing out of facility DNR order (yellow form or pink MOST form) - - - - - - -    Tobacco Social History   Tobacco Use  Smoking Status Never Smoker  Smokeless Tobacco Never Used     Counseling given: Not Answered   Clinical Intake:                                Past Medical History:  Diagnosis Date  . Acute MI, subendocardial ([place])   . Back pain 05/31/2013  . Benign paroxysmal positional vertigo 08/05/2016  . CAD (coronary artery disease)    a. NSTEMI 10/2011: [place] 11/19/11: pLAD 30%, oOM 60%, AVCFX 30%, CFX after OM2 30%, pRCA 60 and 70%, then 99%, AM 80-90% with TIMI 3 flow.  PCI: Promus DES x 2 to RCA; b. 06/2012 Cath: patent RCA stents w/ subtl occl of Acute Marginal (jailed)->Med rx; c. 05/2015 MV: EF 59%, mod mid infsept/inf/ap lat/ap infarct with peri-infarct isch-->Med Rx; d. 02/2016 Cath: LM nl, LAD 64m, RI 50, RCA patent stents.  . Colitis   . Facial skin lesion 01/28/2017  . GERD  (gastroesophageal reflux disease)    occasional  . Hyperlipidemia   . Hypertension   . Hypertensive heart disease    a. Echocardiogram 11/19/11: Difficult acoustic windows, EF 60-65%, normal LV wall thickness, grade 1 diastolic dysfunction  . Hypoparathyroidism ([place])   . Low zinc level 11/26/2016  . Multinodular thyroid    Goiter s/p thyroidectomy in 2007 with post-op  hypocalcemia and post-op hypothyroidism/hypoparathyroidism with hypocalcemia  . Neck pain on right side 07/13/2013  . Nocturia 05/31/2013  . Pain in right axilla 03/13/2016  . Palpitations    a. 03/2012 - patient set up for event monitor but did not wear correctly -  she declined wearing a repeat monitor  . Post-surgical hypothyroidism   . Sun-damaged skin 12/05/2014  . Tubular adenoma of colon 06/2011  . Unspecified constipation 05/31/2013  . Vertigo   . Zinc deficiency 11/26/2015   Past Surgical History:  Procedure Laterality Date  . CARDIAC CATHETERIZATION N/A 03/08/2016   Procedure: Left Heart Cath and Coronary Angiography;  Surgeon: Jettie Booze, MD;  Location: [facility];  Service: Cardiovascular;  Laterality: N/A;  . COLONOSCOPY  Aenomatous polyps   07/05/2011  . COLONOSCOPY N/A 09/28/2014   Procedure: COLONOSCOPY;  Surgeon: Ladene Artist, MD;  Location: [facility];  Service: Endoscopy;  Laterality: N/A;  . CORONARY ANGIOPLASTY WITH STENT PLACEMENT    . LEFT HEART CATHETERIZATION WITH CORONARY ANGIOGRAM N/A 07/17/2012   Procedure: LEFT HEART CATHETERIZATION WITH CORONARY ANGIOGRAM;  Surgeon: Hillary Bow, MD;  Location: [facility];  Service: Cardiovascular;  Laterality: N/A;  . PERCUTANEOUS CORONARY STENT INTERVENTION (PCI-S) N/A 11/19/2011   Procedure: PERCUTANEOUS CORONARY STENT INTERVENTION (PCI-S);  Surgeon: Hillary Bow, MD;  Location: [facility] C[facility] LAB;  Service: Cardiovascular;  Laterality: N/A;  . SHOULDER ARTHROSCOPY W/ ROTATOR CUFF REPAIR     right  . TOTAL THYROIDECTOMY  2007   GOITER    Family History  Problem Relation Age of Onset  . Colon cancer Brother   . Cancer Brother        COLON  . Hypertension Father   . Heart disease Father   . Heart attack Father   . Blindness Sister   . Congestive Heart Failure Sister   . Hypertension Sister   . Thyroid disease Sister   . Cancer Brother        multiple myelomas  . Diabetes Neg Hx   . Prostate cancer Neg Hx   . Breast cancer Neg Hx    Social History   Socioeconomic History  . Marital status: Married    Spouse name: Not on file  . Number of children: 4  . Years of education: 9  . Highest education level: Not on file  Social Needs  . Financial resource strain: Not on file  . Food insecurity - worry: Not on file  . Food insecurity - inability: Not on file  . Transportation needs - medical: Not on file  . Transportation needs - non-medical: Not on file  Occupational History  . Occupation: Sales person at [employer]  . Smoking status: Never Smoker  . Smokeless tobacco: Never Used  Substance and Sexual Activity  . Alcohol use: No  . Drug use: No  . Sexual activity: Not on file  Other Topics Concern  . Not on file  Social History Narrative   Lives at home alone.  Her son lives near her.   Right-handed.   1 cup coffee per day.    Outpatient Encounter Medications as of 11/29/2017  Medication Sig  . amLODipine (NORVASC) 10 MG tablet TAKE 1 TABLET(10 MG) BY MOUTH DAILY  . aspirin EC 81 MG tablet Take 81 mg by mouth daily.    . B Complex-C (B-COMPLEX WITH VITAMIN C) tablet Take 1 tablet by mouth daily.  . bisoprolol (ZEBETA) 5 MG tablet Take 0.5 tablets (2.5 mg total) daily by mouth.  . calcitRIOL (ROCALTROL) 0.25 MCG capsule TAKE 3 CAPSULES(0.75 MCG) BY MOUTH DAILY  . calcium carbonate (TUMS - DOSED IN MG ELEMENTAL CALCIUM) 500 MG chewable tablet Chew 4 tablets by mouth 2 (two) times daily.   . cholecalciferol (VITAMIN D) 1000 UNITS tablet Take 1,000 Units by mouth daily.  . clopidogrel (PLAVIX)  75 MG tablet TAKE 1 TABLET BY MOUTH EVERY DAY  . famotidine (PEPCID) 20 MG tablet Take 1 tablet by mouth two  times daily  . gabapentin (NEURONTIN) 300 MG capsule Take 1 capsule by mouth 3 (three) times daily as needed.  . isosorbide mononitrate (IMDUR) 60 MG 24 hr tablet TAKE 1 AND 1/2 TABLETS(90 MG) BY MOUTH DAILY  . levothyroxine (SYNTHROID, LEVOTHROID) 100 MCG tablet TAKE 1 TABLET(100 MCG) BY MOUTH DAILY  . Menthol, Topical  Analgesic, (ICY HOT EX) Apply 1 patch topically daily.  . methocarbamol (ROBAXIN) 500 MG tablet Take 2 tablets (1,000 mg total) by mouth every 8 (eight) hours as needed (shoulder pain).  . Multiple Vitamin (MULTIVITAMIN WITH MINERALS) TABS tablet Take 1 tablet by mouth daily.  . nitroGLYCERIN (NITROSTAT) 0.4 MG SL tablet DISSOLVE 1 TABLET UNDER THE TONGUE EVERY 5 MINUTES AS NEEDED FOR CHEST PAIN AS DIRECTED  . oxyCODONE-acetaminophen (PERCOCET) 10-325 MG tablet Take 1 tablet by mouth 4 (four) times daily as needed.  . pantoprazole (PROTONIX) 40 MG tablet Take 1 tablet (40 mg total) by mouth daily.  . rosuvastatin (CRESTOR) 40 MG tablet TAKE 1 TABLET(40 MG) BY MOUTH DAILY   No facility-administered encounter medications on file as of 11/29/2017.     Activities of Daily Living In your present state of health, do you have any difficulty performing the following activities: 03/25/2017  Hearing? N  Vision? N  Difficulty concentrating or making decisions? N  Walking or climbing stairs? N  Dressing or bathing? N  Doing errands, shopping? N  Some recent data might be hidden    Timed Get Up and Go performed: ***  Patient Care Team: Mosie Lukes, MD as PCP - General (Family Medicine) Ladene Artist, MD as Consulting Physician (Gastroenterology) Fay Records, MD as Consulting Physician (Cardiology) Suella Broad, MD as Consulting Physician (Physical Medicine and Rehabilitation)    Assessment:    Physical assessment deferred to PCP.  Exercise Activities and  Dietary recommendations   Diet (meal preparation, eat out, water intake, caffeinated beverages, dairy products, fruits and vegetables): {Desc; diets:16563} Breakfast: Lunch:  Dinner:      Goals    None     Fall Risk Fall Risk  05/07/2016 03/07/2015  Falls in the past year? No No   Is the patient's home free of loose throw rugs in walkways, pet beds, electrical cords, etc?   {Blank single:19197::"yes","no"}      Grab bars in the bathroom? {Blank single:19197::"yes","no"}      Handrails on the stairs?   {Blank single:19197::"yes","no"}      Adequate lighting?   {Blank single:19197::"yes","no"}  Depression Screen PHQ 2/9 Scores 07/26/2016 05/07/2016 03/07/2015  PHQ - 2 Score 0 0 0  PHQ- 9 Score 0 - -     Cognitive Function        Immunization History  Administered Date(s) Administered  . Influenza,inj,Quad PF,6+ Mos 09/14/2013  . Pneumococcal Conjugate-13 09/12/2015  . Pneumococcal Polysaccharide-23 06/05/2012  . Tdap 12/27/2003, 03/15/2014   Screening Tests Health Maintenance  Topic Date Due  . Hepatitis C Screening  1946/12/17  . PAP SMEAR  04/24/2012  . COLONOSCOPY  09/28/2017  . INFLUENZA VACCINE  11/26/2017 (Originally 07/24/2017)  . MAMMOGRAM  10/09/2019  . TETANUS/TDAP  03/15/2024  . DEXA SCAN  Completed  . PNA vac Low Risk Adult  Completed        Plan:   ***   I have personally reviewed and noted the following in the patient's chart:   . Medical and social history . Use of alcohol, tobacco or illicit drugs  . Current medications and supplements . Functional ability and status . Nutritional status . Physical activity . Advanced directives . List of other physicians . Hospitalizations, surgeries, and ER visits in previous 12 months . Vitals . Screenings to include cognitive, depression, and falls . Referrals and appointments  In addition, I have reviewed and discussed with patient certain preventive protocols, quality metrics, and best practice  recommendations. A written personalized care plan for preventive services as well as general preventive health recommendations were provided to patient.     Shela Nevin, [place]  11/26/2017

## 2017-11-29 ENCOUNTER — Encounter: Payer: Medicare Other | Admitting: Family Medicine

## 2017-12-02 ENCOUNTER — Ambulatory Visit: Payer: Medicare Other

## 2017-12-09 ENCOUNTER — Ambulatory Visit (INDEPENDENT_AMBULATORY_CARE_PROVIDER_SITE_OTHER): Payer: Medicare Other | Admitting: Pharmacist

## 2017-12-09 ENCOUNTER — Encounter: Payer: Self-pay | Admitting: Pharmacist

## 2017-12-09 DIAGNOSIS — E7849 Other hyperlipidemia: Secondary | ICD-10-CM

## 2017-12-09 MED ORDER — BISOPROLOL FUMARATE 5 MG PO TABS: 2.5000 mg | ORAL_TABLET | Freq: Every day | ORAL | 3 refills | Status: DC

## 2017-12-09 NOTE — Patient Instructions (Addendum)
Start taking rosuvastatin (Crestor) 40mg  daily   We will pursue coverage of Repatha through the insurance.  We will send the prescription to the [facility] in [place].   Once you obtain the medication (Repatha) - call our office [phone] to schedule a time to come give your first injection.    Cholesterol Cholesterol is a fat. Your body needs a small amount of cholesterol. Cholesterol (plaque) may build up in your blood vessels (arteries). That makes you more likely to have a heart attack or stroke. You cannot feel your cholesterol level. Having a blood test is the only way to find out if your level is high. Keep your test results. Work with your doctor to keep your cholesterol at a good level. What do the results mean?  Total cholesterol is how much cholesterol is in your blood.  LDL is bad cholesterol. This is the type that can build up. Try to have low LDL.  HDL is good cholesterol. It cleans your blood vessels and carries LDL away. Try to have high HDL.  Triglycerides are fat that the body can store or burn for energy. What are good levels of cholesterol?  Total cholesterol below 200.  LDL below 100 is good for people who have health risks. LDL below 70 is good for people who have very high risks.  HDL above 40 is good. It is best to have HDL of 60 or higher.  Triglycerides below 150. How can I lower my cholesterol? Diet Follow your diet program as told by your doctor.  Choose fish, white meat chicken, or [place] that is roasted or baked. Try not to eat red meat, fried foods, sausage, or lunch meats.  Eat lots of fresh fruits and vegetables.  Choose whole grains, beans, pasta, potatoes, and cereals.  Choose olive oil, corn oil, or canola oil. Only use small amounts.  Try not to eat butter, mayonnaise, shortening, or palm kernel oils.  Try not to eat foods with trans fats.  Choose low-fat or nonfat dairy foods. ? Drink skim or nonfat milk. ? Eat low-fat or nonfat  yogurt and cheeses. ? Try not to drink whole milk or cream. ? Try not to eat ice cream, egg yolks, or full-fat cheeses.  Healthy desserts include angel food cake, ginger snaps, animal crackers, hard candy, popsicles, and low-fat or nonfat frozen yogurt. Try not to eat pastries, cakes, pies, and cookies.  Exercise Follow your exercise program as told by your doctor.  Be more active. Try gardening, walking, and taking the stairs.  Ask your doctor about ways that you can be more active.  Medicine  Take over-the-counter and prescription medicines only as told by your doctor.  This information is not intended to replace advice given to you by your health care provider. Make sure you discuss any questions you have with your health care provider. Document Released: 03/08/2009 Document Revised: 07/11/2016 Document Reviewed: 06/21/2016 Elsevier Interactive Patient Education  2017 [employer].

## 2017-12-09 NOTE — Progress Notes (Signed)
Patient ID: [name]                 DOB: September 13, 1946                    MRN: [id number]     HPI: [name] is a 71 y.o. adult patient of Dr. Harrington Challenger that presents today for lipid evaluation.  PMH includes CAD - s/p NSTEMI 10/2011 with DESx2 to RCA and normal EF. She underwent repeat LHC on 07/17/12 Patent RCA stent LCA with collaterals to R acute marginal were unchanged. Subtotal occlusion of acute marginal (exits mid stent). Recomm for medical Rx at that time. Other issues include thyroidectomy, GERD, HTN, HLD, vertigo.   She presents today for cholesterol management. She states that Crestor causes her to go to the restroom all night. She takes this medication in the evening. Otherwise she is tolerating the medication well.   She reports that she took Lipitor 40mg  brand name for 30 days. She states she did better on the brand name Lipitor than on rosuvastatin. This was back in May or June of this year.  She also requests a refill be sent for her bisoprolol - this has been done.   Risk Factors: NSTEMI s/p stenting, HTN LDL Goal: <70  Current Medications: Crestor 40mg  daily  Intolerances: zetia - allergic reaction - she was paralyzed from waist down. Lipitor - not effective at lowering LDL  Diet: Prepares most meals from home. She eats mostly vegetables. She uses lamb meat rather than beef. She also uses fish. She eats most of her meals bakes. She sautees using olive oil and occasionally butter. She drinks mostly water with lemon. She does drink hot tea occasionally.    Ex[facility]: [facility] She usually does the treadmill. She is also active at home with housework.   Family History: Blindness in her sister; Cancer in her brother and brother; Colon cancer in her brother; Congestive Heart Failure in her sister; Heart attack in her father; Heart disease in her father; Hypertension in her father and sister; Thyroid disease in her sister.   Social  History: The patient  reports that  has never smoked. she has never used smokeless tobacco. She reports that she does not drink alcohol or use drugs.   Labs: 11/04/17:  TC 172, TG 129, HDL 29, LDL 100 - rosuvastatin 40mg  daily   Past Medical History:  Diagnosis Date  . Acute MI, subendocardial ([place])   . Back pain 05/31/2013  . Benign paroxysmal positional vertigo 08/05/2016  . CAD (coronary artery disease)    a. NSTEMI 10/2011: [place] 11/19/11: pLAD 30%, oOM 60%, AVCFX 30%, CFX after OM2 30%, pRCA 60 and 70%, then 99%, AM 80-90% with TIMI 3 flow.  PCI: Promus DES x 2 to RCA; b. 06/2012 Cath: patent RCA stents w/ subtl occl of Acute Marginal (jailed)->Med rx; c. 05/2015 MV: EF 59%, mod mid infsept/inf/ap lat/ap infarct with peri-infarct isch-->Med Rx; d. 02/2016 Cath: LM nl, LAD 47m, RI 50, RCA patent stents.  . Colitis   . Facial skin lesion 01/28/2017  . GERD (gastroesophageal reflux disease)    occasional  . Hyperlipidemia   . Hypertension   . Hypertensive heart disease    a. Echocardiogram 11/19/11: Difficult acoustic windows, EF 60-65%, normal LV wall thickness, grade 1 diastolic dysfunction  . Hypoparathyroidism ([place])   . Low zinc level 11/26/2016  . Multinodular thyroid  Goiter s/p thyroidectomy in 2007 with post-op  hypocalcemia and post-op hypothyroidism/hypoparathyroidism with hypocalcemia  . Neck pain on right side 07/13/2013  . Nocturia 05/31/2013  . Pain in right axilla 03/13/2016  . Palpitations    a. 03/2012 - patient set up for event monitor but did not wear correctly -  she declined wearing a repeat monitor  . Post-surgical hypothyroidism   . Sun-damaged skin 12/05/2014  . Tubular adenoma of colon 06/2011  . Unspecified constipation 05/31/2013  . Vertigo   . Zinc deficiency 11/26/2015    Current Outpatient Medications on File Prior to Visit  Medication Sig Dispense Refill  . amLODipine (NORVASC) 10 MG tablet TAKE 1 TABLET(10 MG) BY MOUTH DAILY 90 tablet 0  . aspirin EC 81 MG  tablet Take 81 mg by mouth daily.      . B Complex-C (B-COMPLEX WITH VITAMIN C) tablet Take 1 tablet by mouth daily.    . bisoprolol (ZEBETA) 5 MG tablet Take 0.5 tablets (2.5 mg total) daily by mouth. 45 tablet 3  . calcitRIOL (ROCALTROL) 0.25 MCG capsule TAKE 3 CAPSULES(0.75 MCG) BY MOUTH DAILY 270 capsule 0  . calcium carbonate (TUMS - DOSED IN MG ELEMENTAL CALCIUM) 500 MG chewable tablet Chew 4 tablets by mouth 2 (two) times daily.     . cholecalciferol (VITAMIN D) 1000 UNITS tablet Take 1,000 Units by mouth daily.    . clopidogrel (PLAVIX) 75 MG tablet TAKE 1 TABLET BY MOUTH EVERY DAY 90 tablet 0  . famotidine (PEPCID) 20 MG tablet Take 1 tablet by mouth two  times daily 180 tablet 1  . gabapentin (NEURONTIN) 300 MG capsule Take 1 capsule by mouth 3 (three) times daily as needed.  0  . isosorbide mononitrate (IMDUR) 60 MG 24 hr tablet TAKE 1 AND 1/2 TABLETS(90 MG) BY MOUTH DAILY 135 tablet 0  . levothyroxine (SYNTHROID, LEVOTHROID) 100 MCG tablet TAKE 1 TABLET(100 MCG) BY MOUTH DAILY 90 tablet 0  . Menthol, Topical Analgesic, (ICY HOT EX) Apply 1 patch topically daily.    . methocarbamol (ROBAXIN) 500 MG tablet Take 2 tablets (1,000 mg total) by mouth every 8 (eight) hours as needed (shoulder pain). 30 tablet 0  . Multiple Vitamin (MULTIVITAMIN WITH MINERALS) TABS tablet Take 1 tablet by mouth daily.    . nitroGLYCERIN (NITROSTAT) 0.4 MG SL tablet DISSOLVE 1 TABLET UNDER THE TONGUE EVERY 5 MINUTES AS NEEDED FOR CHEST PAIN AS DIRECTED 75 tablet 3  . oxyCODONE-acetaminophen (PERCOCET) 10-325 MG tablet Take 1 tablet by mouth 4 (four) times daily as needed.  0  . pantoprazole (PROTONIX) 40 MG tablet Take 1 tablet (40 mg total) by mouth daily. 90 tablet 3  . rosuvastatin (CRESTOR) 40 MG tablet TAKE 1 TABLET(40 MG) BY MOUTH DAILY 90 tablet 0   No current facility-administered medications on file prior to visit.     Allergies  Allergen Reactions  . Zetia [Ezetimibe] Other (See Comments)     Myalgia, paresthesias and weakness    Assessment/Plan: Hyperlipidemia: LDL not at goal on rosuvastatin 40mg  daily. Will move statin dosage to morning to help with frequent nighttime urination that she associated with statin. Patient is unable to take zetia due to a previous reaction. Will pursue PCSK9i therapy for added LDL lowering. She would like to come to clinic to perform first injection.  She has medicare and medicaid thus cost should not be an issue.    Thank you,  Lelan Pons. Patterson Hammersmith, [place]  12/09/2017 8:09 AM

## 2017-12-10 ENCOUNTER — Telehealth: Payer: Self-pay | Admitting: Pharmacist

## 2017-12-10 ENCOUNTER — Other Ambulatory Visit: Payer: Self-pay | Admitting: Family Medicine

## 2017-12-10 DIAGNOSIS — I10 Essential (primary) hypertension: Secondary | ICD-10-CM

## 2017-12-10 DIAGNOSIS — E039 Hypothyroidism, unspecified: Secondary | ICD-10-CM

## 2017-12-10 DIAGNOSIS — E892 Postprocedural hypoparathyroidism: Secondary | ICD-10-CM

## 2017-12-10 DIAGNOSIS — E876 Hypokalemia: Secondary | ICD-10-CM

## 2017-12-10 DIAGNOSIS — E60 Dietary zinc deficiency: Secondary | ICD-10-CM

## 2017-12-10 DIAGNOSIS — R1013 Epigastric pain: Secondary | ICD-10-CM

## 2017-12-10 DIAGNOSIS — Z0000 Encounter for general adult medical examination without abnormal findings: Secondary | ICD-10-CM

## 2017-12-10 MED ORDER — EVOLOCUMAB 140 MG/ML ~~LOC~~ SOAJ: 140.0000 mg | SUBCUTANEOUS | 3 refills | Status: DC

## 2017-12-10 NOTE — Telephone Encounter (Signed)
PA approved for Repatha through Medicare.   Rx sent to [facility].

## 2017-12-12 NOTE — Telephone Encounter (Signed)
Repatha affordable at $3.70 per month. Pt has been contacted to set up shipment.

## 2017-12-23 ENCOUNTER — Telehealth: Payer: Self-pay | Admitting: Internal Medicine

## 2017-12-23 NOTE — Telephone Encounter (Signed)
Spoke with patient and she has received her first shipment of [place]. She will come to office to give first injection on 12/26/16. Labs will be schedule while here to give injection.

## 2017-12-23 NOTE — Telephone Encounter (Signed)
New Message  If Home Health RN is calling please get Coumadin Nurse on the phone STAT  1.  Are you calling in regards to an appointment? no  2.  Are you calling for a refill ? no  3.  Are you having bleeding issues? no  4.  Do you need clearance to hold Coumadin? No  Patient states that she received her injection medication and was told to call and speak with the pharmacist. Please call.    Please route to the Coumadin Clinic Pool

## 2017-12-26 ENCOUNTER — Telehealth: Payer: Self-pay | Admitting: Pharmacist

## 2017-12-26 DIAGNOSIS — E782 Mixed hyperlipidemia: Secondary | ICD-10-CM

## 2017-12-26 NOTE — Telephone Encounter (Signed)
Pt presents to clinic today for first [place] injection training. Pt is fearful of administering an injection and does not have anyone at home to help her. She was able to successfully self-administer Repatha into her abdomen and states she will call if she has trouble with future injections. F/u lab work scheduled after 3-4 Repatha injections.

## 2018-01-02 ENCOUNTER — Encounter: Payer: Self-pay | Admitting: Internal Medicine

## 2018-01-02 ENCOUNTER — Ambulatory Visit (INDEPENDENT_AMBULATORY_CARE_PROVIDER_SITE_OTHER): Payer: Medicare Other | Admitting: Internal Medicine

## 2018-01-02 VITALS — BP 130/68 | HR 68 | Ht 64.0 in | Wt 197.8 lb

## 2018-01-02 DIAGNOSIS — E039 Hypothyroidism, unspecified: Secondary | ICD-10-CM

## 2018-01-02 DIAGNOSIS — E876 Hypokalemia: Secondary | ICD-10-CM

## 2018-01-02 DIAGNOSIS — I10 Essential (primary) hypertension: Secondary | ICD-10-CM

## 2018-01-02 DIAGNOSIS — I2510 Atherosclerotic heart disease of native coronary artery without angina pectoris: Secondary | ICD-10-CM

## 2018-01-02 DIAGNOSIS — I999 Unspecified disorder of circulatory system: Secondary | ICD-10-CM

## 2018-01-02 DIAGNOSIS — E782 Mixed hyperlipidemia: Secondary | ICD-10-CM

## 2018-01-02 DIAGNOSIS — I739 Peripheral vascular disease, unspecified: Secondary | ICD-10-CM

## 2018-01-02 MED ORDER — CLOPIDOGREL BISULFATE 75 MG PO TABS: 75.0000 mg | ORAL_TABLET | Freq: Every day | ORAL | 3 refills | Status: DC

## 2018-01-02 MED ORDER — CARVEDILOL 3.125 MG PO TABS: 3.1250 mg | ORAL_TABLET | Freq: Two times a day (BID) | ORAL | 3 refills | Status: DC

## 2018-01-02 MED ORDER — AMLODIPINE BESYLATE 10 MG PO TABS: ORAL_TABLET | ORAL | 3 refills | Status: DC

## 2018-01-02 MED ORDER — ROSUVASTATIN CALCIUM 40 MG PO TABS: 40.0000 mg | ORAL_TABLET | Freq: Every day | ORAL | 3 refills | Status: DC

## 2018-01-02 MED ORDER — ISOSORBIDE MONONITRATE ER 60 MG PO TB24: ORAL_TABLET | ORAL | 3 refills | Status: DC

## 2018-01-02 NOTE — Progress Notes (Signed)
Cardiology Office Note   Date:  01/02/2018   ID:  [name], DOB 1946-10-07, MRN [id number]  PCP:  Mosie Lukes, MD  Cardiologist:   Dorris Carnes, MD    F/U of CAD     History of Present Illness: [name] is a 72 y.o. adult with a history of CAD - s/p NSTEMI 10/2011 with DESx2 to RCA and normal EF. She underwent repeat LHC on 07/17/12 Patent RCA stent LCA with collaterals to R acute marginal were unchanged. Subtotal occlusion of acute marginal (exits mid stent). Recomm for medical Rx at that time. Other issues include thyroidectomy, GERD, HTN, HLD, vertigo.Last myoviue showed inferolateral scar with minimal periinfarct ischemia   She was sick while in [place] last summer 2018  .   Had CP  R/O for MI  EKGs negative  Seen by Cardiology  Recm she increase b blockade  Add bisoprolol I saw her in November 2018   She was seen by Baldo Daub (Pharm) in Dec for lipids  Now on repatha    Pt deneis CP  Breathing is OK  No pounding    Current Meds  Medication Sig  . amLODipine (NORVASC) 10 MG tablet TAKE 1 TABLET(10 MG) BY MOUTH DAILY  . aspirin EC 81 MG tablet Take 81 mg by mouth daily.    . B Complex-C (B-COMPLEX WITH VITAMIN C) tablet Take 1 tablet by mouth daily.  . bisoprolol (ZEBETA) 5 MG tablet Take 0.5 tablets (2.5 mg total) by mouth daily.  . calcitRIOL (ROCALTROL) 0.25 MCG capsule TAKE 3 CAPSULES(0.75 MCG) BY MOUTH DAILY  . calcium carbonate (TUMS - DOSED IN MG ELEMENTAL CALCIUM) 500 MG chewable tablet Chew 4 tablets by mouth 2 (two) times daily.   . cholecalciferol (VITAMIN D) 1000 UNITS tablet Take 1,000 Units by mouth daily.  . clopidogrel (PLAVIX) 75 MG tablet TAKE 1 TABLET BY MOUTH EVERY DAY  . Evolocumab (REPATHA SURECLICK) 945 MG/ML SOAJ Inject 140 mg into the skin every 14 (fourteen) days.  . famotidine (PEPCID) 20 MG tablet Take 20 mg by mouth 2 (two) times daily as needed for heartburn or indigestion.  . gabapentin (NEURONTIN) 300 MG capsule Take 1 capsule by  mouth 3 (three) times daily as needed.  . isosorbide mononitrate (IMDUR) 60 MG 24 hr tablet TAKE 1 AND 1/2 TABLETS(90 MG) BY MOUTH DAILY  . levothyroxine (SYNTHROID, LEVOTHROID) 100 MCG tablet TAKE 1 TABLET(100 MCG) BY MOUTH DAILY  . Menthol, Topical Analgesic, (ICY HOT EX) Apply 1 patch topically daily.  . methocarbamol (ROBAXIN) 500 MG tablet Take 2 tablets (1,000 mg total) by mouth every 8 (eight) hours as needed (shoulder pain).  . Multiple Vitamin (MULTIVITAMIN WITH MINERALS) TABS tablet Take 1 tablet by mouth daily.  . nitroGLYCERIN (NITROSTAT) 0.4 MG SL tablet DISSOLVE 1 TABLET UNDER THE TONGUE EVERY 5 MINUTES AS NEEDED FOR CHEST PAIN AS DIRECTED  . oxyCODONE-acetaminophen (PERCOCET) 10-325 MG tablet Take 1 tablet by mouth 4 (four) times daily as needed.  . pantoprazole (PROTONIX) 40 MG tablet Take 1 tablet (40 mg total) by mouth daily.  . rosuvastatin (CRESTOR) 40 MG tablet TAKE 1 TABLET(40 MG) BY MOUTH DAILY     Allergies:   Zetia [ezetimibe]   Past Medical History:  Diagnosis Date  . Acute MI, subendocardial ([place])   . Back pain 05/31/2013  . Benign paroxysmal positional vertigo 08/05/2016  . CAD (coronary artery disease)    a. NSTEMI 10/2011: [place] 11/19/11: pLAD 30%, oOM 60%, AVCFX  30%, CFX after OM2 30%, pRCA 60 and 70%, then 99%, AM 80-90% with TIMI 3 flow.  PCI: Promus DES x 2 to RCA; b. 06/2012 Cath: patent RCA stents w/ subtl occl of Acute Marginal (jailed)->Med rx; c. 05/2015 MV: EF 59%, mod mid infsept/inf/ap lat/ap infarct with peri-infarct isch-->Med Rx; d. 02/2016 Cath: LM nl, LAD 25m, RI 50, RCA patent stents.  . Colitis   . Facial skin lesion 01/28/2017  . GERD (gastroesophageal reflux disease)    occasional  . Hyperlipidemia   . Hypertension   . Hypertensive heart disease    a. Echocardiogram 11/19/11: Difficult acoustic windows, EF 60-65%, normal LV wall thickness, grade 1 diastolic dysfunction  . Hypoparathyroidism ([place])   . Low zinc level 11/26/2016  . Multinodular  thyroid    Goiter s/p thyroidectomy in 2007 with post-op  hypocalcemia and post-op hypothyroidism/hypoparathyroidism with hypocalcemia  . Neck pain on right side 07/13/2013  . Nocturia 05/31/2013  . Pain in right axilla 03/13/2016  . Palpitations    a. 03/2012 - patient set up for event monitor but did not wear correctly -  she declined wearing a repeat monitor  . Post-surgical hypothyroidism   . Sun-damaged skin 12/05/2014  . Tubular adenoma of colon 06/2011  . Unspecified constipation 05/31/2013  . Vertigo   . Zinc deficiency 11/26/2015    Past Surgical History:  Procedure Laterality Date  . CARDIAC CATHETERIZATION N/A 03/08/2016   Procedure: Left Heart Cath and Coronary Angiography;  Surgeon: Jettie Booze, MD;  Location: [facility];  Service: Cardiovascular;  Laterality: N/A;  . COLONOSCOPY  Aenomatous polyps   07/05/2011  . COLONOSCOPY N/A 09/28/2014   Procedure: COLONOSCOPY;  Surgeon: Ladene Artist, MD;  Location: [facility] ENDOSCOPY;  Service: Endoscopy;  Laterality: N/A;  . CORONARY ANGIOPLASTY WITH STENT PLACEMENT    . LEFT HEART CATHETERIZATION WITH CORONARY ANGIOGRAM N/A 07/17/2012   Procedure: LEFT HEART CATHETERIZATION WITH CORONARY ANGIOGRAM;  Surgeon: Hillary Bow, MD;  Location: [facility] CATH LAB;  Service: Cardiovascular;  Laterality: N/A;  . PERCUTANEOUS CORONARY STENT INTERVENTION (PCI-S) N/A 11/19/2011   Procedure: PERCUTANEOUS CORONARY STENT INTERVENTION (PCI-S);  Surgeon: Hillary Bow, MD;  Location: [facility] CATH LAB;  Service: Cardiovascular;  Laterality: N/A;  . SHOULDER ARTHROSCOPY W/ ROTATOR CUFF REPAIR     right  . TOTAL THYROIDECTOMY  2007   GOITER     Social History:  The patient  reports that  has never smoked. she has never used smokeless tobacco. She reports that she does not drink alcohol or use drugs.   Family History:  The patient's family history includes Blindness in her sister; Cancer in her brother and brother; Colon cancer in her brother;  Congestive Heart Failure in her sister; Heart attack in her father; Heart disease in her father; Hypertension in her father and sister; Thyroid disease in her sister.    ROS:  Please see the history of present illness. All other systems are reviewed and  Negative to the above problem except as noted.    PHYSICAL EXAM: VS:  BP 130/68   Pulse 68   Ht 5\' 4"  (1.626 m)   Wt 197 lb 12.8 oz (89.7 kg)   SpO2 95%   BMI 33.95 kg/m   GEN: Well nourished, well developed, in no acute distress  HEENT: normal  Neck:  No JVD  No bruits   Cardiac: RRR; no murmurs, rubs, or gallops,no edema     Tr PT pulses   Respiratory:  clear to auscultation bilaterally, normal work of breathing GI: soft, nontender, nondistended, + BS  No hepatomegaly  MS: no deformity Moving all extremities   Skin: warm and dry, no rash Neuro:  Strength and sensation are intact Psych: euthymic mood, full affect   EKG:  EKG is not ordered today.   Lipid Panel    Component Value Date/Time   CHOL 172 11/04/2017 1059   TRIG 129 11/04/2017 1059   HDL 46 11/04/2017 1059   CHOLHDL 3.7 11/04/2017 1059   CHOLHDL 4 04/05/2017 1206   VLDL 38.4 04/05/2017 1206   LDLCALC 100 (H) 11/04/2017 1059   LDLDIRECT 83.8 08/11/2012 1203      Wt Readings from Last 3 Encounters:  01/02/18 197 lb 12.8 oz (89.7 kg)  11/04/17 200 lb 12.8 oz (91.1 kg)  04/05/17 198 lb (89.8 kg)      ASSESSMENT AND PLAN:  1  CAD  Doing good  Continue meds   2.  Hx GERD  Pt asymptomatic  3  HL On repatha  Will need to get liptids  In the next few months    4  Ophthy   Notices blinking since startin Zebeta  Will try carvedilol 3.125 bid  Follow    5  CV dz  Conitnue on statin    6  PVOD  Denies symtpoms    Keep appt in Jan     Current medicines are reviewed at length with the patient today.  The patient does not have concerns regarding medicines.  Signed, Dorris Carnes, MD  01/02/2018 10:47 AM    [address], [place], [place]  [address] Phone: [phone]; Fax: [phone]

## 2018-01-02 NOTE — Patient Instructions (Signed)
Your physician has recommended you make the following change in your medication:  1.) STOP BISOPROLOL 2.) START CARVEDILOL 3.125 MG -TAKE ONE TABLET TWICE A DAY  Your physician wants you to follow-up in: [place] DR. Harrington Challenger.  You will receive a reminder letter in the mail two months in advance. If you don't receive a letter, please call our office to schedule the follow-up appointment.

## 2018-01-09 ENCOUNTER — Encounter: Payer: Medicare Other | Admitting: Family Medicine

## 2018-01-24 DIAGNOSIS — H04123 Dry eye syndrome of bilateral lacrimal glands: Secondary | ICD-10-CM | POA: Diagnosis not present

## 2018-01-27 ENCOUNTER — Other Ambulatory Visit: Payer: Self-pay | Admitting: Internal Medicine

## 2018-01-27 DIAGNOSIS — I6523 Occlusion and stenosis of bilateral carotid arteries: Secondary | ICD-10-CM

## 2018-02-03 ENCOUNTER — Ambulatory Visit (HOSPITAL_COMMUNITY)
Admission: RE | Admit: 2018-02-03 | Discharge: 2018-02-03 | Disposition: A | Discharge status: Home / Self Care | Payer: Medicare Other | Source: Ambulatory Visit | Attending: Internal Medicine | Admitting: Internal Medicine

## 2018-02-03 DIAGNOSIS — I6523 Occlusion and stenosis of bilateral carotid arteries: Secondary | ICD-10-CM

## 2018-02-10 ENCOUNTER — Other Ambulatory Visit: Payer: Medicare Other | Admitting: *Deleted

## 2018-02-10 DIAGNOSIS — E782 Mixed hyperlipidemia: Secondary | ICD-10-CM

## 2018-02-10 LAB — HEPATIC FUNCTION PANEL
ALK PHOS: 93 IU/L (ref 39–117)
ALT: 16 IU/L (ref 0–32)
AST: 14 IU/L (ref 0–40)
Albumin: 4 g/dL (ref 3.5–4.8)
BILIRUBIN TOTAL: 0.3 mg/dL (ref 0.0–1.2)
Bilirubin, Direct: 0.12 mg/dL (ref 0.00–0.40)
Total Protein: 6.6 g/dL (ref 6.0–8.5)

## 2018-02-10 LAB — LIPID PANEL
CHOLESTEROL TOTAL: 88 mg/dL — AB (ref 100–199)
Chol/HDL Ratio: 2.1 ratio (ref 0.0–4.4)
HDL: 41 mg/dL (ref >39)
LDL CALC: 24 mg/dL (ref 0–99)
TRIGLYCERIDES: 116 mg/dL (ref 0–149)
VLDL Cholesterol Cal: 23 mg/dL (ref 5–40)

## 2018-02-17 ENCOUNTER — Ambulatory Visit (INDEPENDENT_AMBULATORY_CARE_PROVIDER_SITE_OTHER): Payer: Medicare Other | Admitting: Family Medicine

## 2018-02-17 DIAGNOSIS — R51 Headache: Secondary | ICD-10-CM

## 2018-02-17 DIAGNOSIS — E60 Dietary zinc deficiency: Secondary | ICD-10-CM

## 2018-02-17 DIAGNOSIS — M79671 Pain in right foot: Secondary | ICD-10-CM

## 2018-02-17 DIAGNOSIS — Z0000 Encounter for general adult medical examination without abnormal findings: Secondary | ICD-10-CM | POA: Diagnosis not present

## 2018-02-17 DIAGNOSIS — L578 Other skin changes due to chronic exposure to nonionizing radiation: Secondary | ICD-10-CM

## 2018-02-17 DIAGNOSIS — N950 Postmenopausal bleeding: Secondary | ICD-10-CM

## 2018-02-17 DIAGNOSIS — G44209 Tension-type headache, unspecified, not intractable: Secondary | ICD-10-CM

## 2018-02-17 DIAGNOSIS — E892 Postprocedural hypoparathyroidism: Secondary | ICD-10-CM

## 2018-02-17 DIAGNOSIS — E039 Hypothyroidism, unspecified: Secondary | ICD-10-CM | POA: Diagnosis not present

## 2018-02-17 DIAGNOSIS — G441 Vascular headache, not elsewhere classified: Secondary | ICD-10-CM

## 2018-02-17 DIAGNOSIS — I10 Essential (primary) hypertension: Secondary | ICD-10-CM

## 2018-02-17 DIAGNOSIS — E7849 Other hyperlipidemia: Secondary | ICD-10-CM

## 2018-02-17 DIAGNOSIS — B351 Tinea unguium: Secondary | ICD-10-CM

## 2018-02-17 DIAGNOSIS — F419 Anxiety disorder, unspecified: Secondary | ICD-10-CM

## 2018-02-17 DIAGNOSIS — M79604 Pain in right leg: Secondary | ICD-10-CM | POA: Insufficient documentation

## 2018-02-17 DIAGNOSIS — R519 Headache, unspecified: Secondary | ICD-10-CM

## 2018-02-17 MED ORDER — ALPRAZOLAM 0.25 MG PO TABS: 0.2500 mg | ORAL_TABLET | Freq: Two times a day (BID) | ORAL | 0 refills | Status: DC | PRN

## 2018-02-17 MED ORDER — CALCITRIOL 0.25 MCG PO CAPS: ORAL_CAPSULE | ORAL | 0 refills | Status: DC

## 2018-02-17 MED ORDER — LEVOTHYROXINE SODIUM 100 MCG PO TABS: ORAL_TABLET | ORAL | 0 refills | Status: DC

## 2018-02-17 NOTE — Assessment & Plan Note (Signed)
On Levothyroxine, continue to monitor 

## 2018-02-17 NOTE — Assessment & Plan Note (Signed)
Just occurred this past weekend. Occurred twice will refer to GYN for further consideration

## 2018-02-17 NOTE — Assessment & Plan Note (Signed)
Check cmp and vitamin d level

## 2018-02-17 NOTE — Patient Instructions (Signed)
Shingrix is the new shingles shot, 2 shots over 2-6 months. Preventive Care 71 Years and Older, Female Preventive care refers to lifestyle choices and visits with your health care provider that can promote health and wellness. What does preventive care include?  A yearly physical exam. This is also called an annual well check.  Dental exams once or twice a year.  Routine eye exams. Ask your health care provider how often you should have your eyes checked.  Personal lifestyle choices, including: ? Daily care of your teeth and gums. ? Regular physical activity. ? Eating a healthy diet. ? Avoiding tobacco and drug use. ? Limiting alcohol use. ? Practicing safe sex. ? Taking low-dose aspirin every day. ? Taking vitamin and mineral supplements as recommended by your health care provider. What happens during an annual well check? The services and screenings done by your health care provider during your annual well check will depend on your age, overall health, lifestyle risk factors, and family history of disease. Counseling Your health care provider may ask you questions about your:  Alcohol use.  Tobacco use.  Drug use.  Emotional well-being.  Home and relationship well-being.  Sexual activity.  Eating habits.  History of falls.  Memory and ability to understand (cognition).  Work and work Statistician.  Reproductive health.  Screening You may have the following tests or measurements:  Height, weight, and BMI.  Blood pressure.  Lipid and cholesterol levels. These may be checked every 5 years, or more frequently if you are over 67 years old.  Skin check.  Lung cancer screening. You may have this screening every year starting at age 78 if you have a 30-pack-year history of smoking and currently smoke or have quit within the past 15 years.  Fecal occult blood test (FOBT) of the stool. You may have this test every year starting at age 48.  Flexible sigmoidoscopy  or colonoscopy. You may have a sigmoidoscopy every 5 years or a colonoscopy every 10 years starting at age 23.  Hepatitis C blood test.  Hepatitis B blood test.  Sexually transmitted disease (STD) testing.  Diabetes screening. This is done by checking your blood sugar (glucose) after you have not eaten for a while (fasting). You may have this done every 1-3 years.  Bone density scan. This is done to screen for osteoporosis. You may have this done starting at age 27.  Mammogram. This may be done every 1-2 years. Talk to your health care provider about how often you should have regular mammograms.  Talk with your health care provider about your test results, treatment options, and if necessary, the need for more tests. Vaccines Your health care provider may recommend certain vaccines, such as:  Influenza vaccine. This is recommended every year.  Tetanus, diphtheria, and acellular pertussis (Tdap, Td) vaccine. You may need a Td booster every 10 years.  Varicella vaccine. You may need this if you have not been vaccinated.  Zoster vaccine. You may need this after age 4.  Measles, mumps, and rubella (MMR) vaccine. You may need at least one dose of MMR if you were born in 1957 or later. You may also need a second dose.  Pneumococcal 13-valent conjugate (PCV13) vaccine. One dose is recommended after age 61.  Pneumococcal polysaccharide (PPSV23) vaccine. One dose is recommended after age 44.  Meningococcal vaccine. You may need this if you have certain conditions.  Hepatitis A vaccine. You may need this if you have certain conditions or if you travel  or work in places where you may be exposed to hepatitis A.  Hepatitis B vaccine. You may need this if you have certain conditions or if you travel or work in places where you may be exposed to hepatitis B.  Haemophilus influenzae type b (Hib) vaccine. You may need this if you have certain conditions.  Talk to your health care provider  about which screenings and vaccines you need and how often you need them. This information is not intended to replace advice given to you by your health care provider. Make sure you discuss any questions you have with your health care provider. Document Released: 01/06/2016 Document Revised: 08/29/2016 Document Reviewed: 10/11/2015 Elsevier Interactive Patient Education  [employer].

## 2018-02-17 NOTE — Assessment & Plan Note (Signed)
Patient encouraged to maintain heart healthy diet, regular exercise, adequate sleep. Consider daily probiotics. Take medications as prescribed 

## 2018-02-17 NOTE — Assessment & Plan Note (Signed)
Worsening headache with a throbbing sensation behind her left eye she has had an evaluatin with opthamology and no cause in eye found is set up for MR to r/o any concernng lesions. Given Alprazolam to use prn for the MR

## 2018-02-17 NOTE — Assessment & Plan Note (Signed)
Check level today 

## 2018-02-17 NOTE — Progress Notes (Signed)
Subjective:  I acted as a Education administrator for Dr. Charlett Blake. [place], [place]  Patient ID: [name], adult    DOB: [birth year], 72 y.o.   MRN: [id number]  No chief complaint on file.   HPI  Patient is in today for annual exam and folow up on chronic medical concerns including CAD, zinc deficiency, reflux, and thyroid disease. She feels well today but she does endorse trouble with pain in right foot and thickening of her toenails. No recent febrile illness or hospitalizations. No polyuria or polydipsia. Is eating well and trying to stay active. Is doing well with activities of daily living.Denies CP/palp/SOB/congestion/fevers/GI or GU c/o. Taking meds as prescribed  Patient Care Team: Mosie Lukes, MD as PCP - General (Family Medicine) Ladene Artist, MD as Consulting Physician (Gastroenterology) Fay Records, MD as Consulting Physician (Cardiology) Suella Broad, MD as Consulting Physician (Physical Medicine and Rehabilitation)   Past Medical History:  Diagnosis Date  . Acute MI, subendocardial ([place])   . Back pain 05/31/2013  . Benign paroxysmal positional vertigo 08/05/2016  . CAD (coronary artery disease)    a. NSTEMI 10/2011: [place] 11/19/11: pLAD 30%, oOM 60%, AVCFX 30%, CFX after OM2 30%, pRCA 60 and 70%, then 99%, AM 80-90% with TIMI 3 flow.  PCI: Promus DES x 2 to RCA; b. 06/2012 Cath: patent RCA stents w/ subtl occl of Acute Marginal (jailed)->Med rx; c. 05/2015 MV: EF 59%, mod mid infsept/inf/ap lat/ap infarct with peri-infarct isch-->Med Rx; d. 02/2016 Cath: LM nl, LAD 11m, RI 50, RCA patent stents.  . Colitis   . Facial skin lesion 01/28/2017  . GERD (gastroesophageal reflux disease)    occasional  . Hyperlipidemia   . Hypertension   . Hypertensive heart disease    a. Echocardiogram 11/19/11: Difficult acoustic windows, EF 60-65%, normal LV wall thickness, grade 1 diastolic dysfunction  . Hypoparathyroidism ([place])   . Low zinc level 11/26/2016  . Multinodular thyroid    Goiter s/p  thyroidectomy in 2007 with post-op  hypocalcemia and post-op hypothyroidism/hypoparathyroidism with hypocalcemia  . Neck pain on right side 07/13/2013  . Nocturia 05/31/2013  . Pain in right axilla 03/13/2016  . Palpitations    a. 03/2012 - patient set up for event monitor but did not wear correctly -  she declined wearing a repeat monitor  . Post-surgical hypothyroidism   . Sun-damaged skin 12/05/2014  . Tubular adenoma of colon 06/2011  . Unspecified constipation 05/31/2013  . Vertigo   . Zinc deficiency 11/26/2015    Past Surgical History:  Procedure Laterality Date  . CARDIAC CATHETERIZATION N/A 03/08/2016   Procedure: Left Heart Cath and Coronary Angiography;  Surgeon: Jettie Booze, MD;  Location: [facility];  Service: Cardiovascular;  Laterality: N/A;  . COLONOSCOPY  Aenomatous polyps   07/05/2011  . COLONOSCOPY N/A 09/28/2014   Procedure: COLONOSCOPY;  Surgeon: Ladene Artist, MD;  Location: [facility];  Service: Endoscopy;  Laterality: N/A;  . CORONARY ANGIOPLASTY WITH STENT PLACEMENT    . LEFT HEART CATHETERIZATION WITH CORONARY ANGIOGRAM N/A 07/17/2012   Procedure: LEFT HEART CATHETERIZATION WITH CORONARY ANGIOGRAM;  Surgeon: Hillary Bow, MD;  Location: [facility] CATH LAB;  Service: Cardiovascular;  Laterality: N/A;  . PERCUTANEOUS CORONARY STENT INTERVENTION (PCI-S) N/A 11/19/2011   Procedure: PERCUTANEOUS CORONARY STENT INTERVENTION (PCI-S);  Surgeon: Hillary Bow, MD;  Location: [facility] CATH LAB;  Service: Cardiovascular;  Laterality: N/A;  . SHOULDER ARTHROSCOPY W/ ROTATOR CUFF REPAIR     right  .  TOTAL THYROIDECTOMY  2007   GOITER    Family History  Problem Relation Age of Onset  . Colon cancer Brother   . Cancer Brother        COLON  . Hypertension Father   . Heart disease Father   . Heart attack Father   . Blindness Sister   . Congestive Heart Failure Sister   . Hypertension Sister   . Thyroid disease Sister   . Cancer Brother        multiple myelomas   . Diabetes Neg Hx   . Prostate cancer Neg Hx   . Breast cancer Neg Hx     Social History   Socioeconomic History  . Marital status: Married    Spouse name: Not on file  . Number of children: 4  . Years of education: 11  . Highest education level: Not on file  Social Needs  . Financial resource strain: Not on file  . Food insecurity - worry: Not on file  . Food insecurity - inability: Not on file  . Transportation needs - medical: Not on file  . Transportation needs - non-medical: Not on file  Occupational History  . Occupation: Sales person at [employer]  . Smoking status: Never Smoker  . Smokeless tobacco: Never Used  Substance and Sexual Activity  . Alcohol use: No  . Drug use: No  . Sexual activity: Not on file  Other Topics Concern  . Not on file  Social History Narrative   Lives at home alone.  Her son lives near her.   Right-handed.   1 cup coffee per day.    Outpatient Medications Prior to Visit  Medication Sig Dispense Refill  . amLODipine (NORVASC) 10 MG tablet TAKE 1 TABLET(10 MG) BY MOUTH DAILY 90 tablet 3  . aspirin EC 81 MG tablet Take 81 mg by mouth daily.      . B Complex-C (B-COMPLEX WITH VITAMIN C) tablet Take 1 tablet by mouth daily.    . calcium carbonate (TUMS - DOSED IN MG ELEMENTAL CALCIUM) 500 MG chewable tablet Chew 4 tablets by mouth 2 (two) times daily.     . carvedilol (COREG) 3.125 MG tablet Take 1 tablet (3.125 mg total) by mouth 2 (two) times daily. 180 tablet 3  . cholecalciferol (VITAMIN D) 1000 UNITS tablet Take 1,000 Units by mouth daily.    . clopidogrel (PLAVIX) 75 MG tablet Take 1 tablet (75 mg total) by mouth daily. 90 tablet 3  . Evolocumab (REPATHA SURECLICK) 694 MG/ML SOAJ Inject 140 mg into the skin every 14 (fourteen) days. 6 pen 3  . famotidine (PEPCID) 20 MG tablet Take 20 mg by mouth 2 (two) times daily as needed for heartburn or indigestion.    . gabapentin (NEURONTIN) 300 MG capsule Take 1 capsule by mouth 3  (three) times daily as needed.  0  . isosorbide mononitrate (IMDUR) 60 MG 24 hr tablet TAKE 1 AND 1/2 TABLETS(90 MG) BY MOUTH DAILY 135 tablet 3  . Menthol, Topical Analgesic, (ICY HOT EX) Apply 1 patch topically daily.    . methocarbamol (ROBAXIN) 500 MG tablet Take 2 tablets (1,000 mg total) by mouth every 8 (eight) hours as needed (shoulder pain). 30 tablet 0  . Multiple Vitamin (MULTIVITAMIN WITH MINERALS) TABS tablet Take 1 tablet by mouth daily.    . nitroGLYCERIN (NITROSTAT) 0.4 MG SL tablet DISSOLVE 1 TABLET UNDER THE TONGUE EVERY 5 MINUTES AS NEEDED FOR CHEST PAIN AS DIRECTED 75  tablet 3  . oxyCODONE-acetaminophen (PERCOCET) 10-325 MG tablet Take 1 tablet by mouth 4 (four) times daily as needed.  0  . pantoprazole (PROTONIX) 40 MG tablet Take 1 tablet (40 mg total) by mouth daily. 90 tablet 3  . rosuvastatin (CRESTOR) 40 MG tablet Take 1 tablet (40 mg total) by mouth daily. 90 tablet 3  . calcitRIOL (ROCALTROL) 0.25 MCG capsule TAKE 3 CAPSULES(0.75 MCG) BY MOUTH DAILY 270 capsule 0  . levothyroxine (SYNTHROID, LEVOTHROID) 100 MCG tablet TAKE 1 TABLET(100 MCG) BY MOUTH DAILY 90 tablet 0   No facility-administered medications prior to visit.     Allergies  Allergen Reactions  . Zetia [Ezetimibe] Other (See Comments)    Myalgia, paresthesias and weakness    Review of Systems  Constitutional: Positive for malaise/fatigue. Negative for chills and fever.  HENT: Negative for congestion and hearing loss.   Eyes: Negative for discharge.  Respiratory: Negative for cough, sputum production and shortness of breath.   Cardiovascular: Negative for chest pain, palpitations and leg swelling.  Gastrointestinal: Negative for abdominal pain, blood in stool, constipation, diarrhea, heartburn, nausea and vomiting.  Genitourinary: Negative for dysuria, frequency, hematuria and urgency.  Musculoskeletal: Positive for joint pain. Negative for back pain, falls and myalgias.  Skin: Negative for rash.    Neurological: Positive for headaches. Negative for dizziness, sensory change, focal weakness, loss of consciousness and weakness.  Endo/Heme/Allergies: Negative for environmental allergies. Does not bruise/bleed easily.  Psychiatric/Behavioral: Negative for depression and suicidal ideas. The patient is not nervous/anxious and does not have insomnia.        Objective:    Physical Exam  Constitutional: She is oriented to person, place, and time. She appears well-developed and well-nourished. No distress.  HENT:  Head: Normocephalic and atraumatic.  Eyes: Conjunctivae are normal.  Neck: Neck supple. No thyromegaly present.  Cardiovascular: Normal rate, regular rhythm and normal heart sounds.  No murmur heard. Pulmonary/Chest: Effort normal and breath sounds normal. No respiratory distress.  Abdominal: Soft. Bowel sounds are normal. She exhibits no distension and no mass. There is no tenderness.  Musculoskeletal: She exhibits no edema.  Lymphadenopathy:    She has no cervical adenopathy.  Neurological: She is alert and oriented to person, place, and time.  Skin: Skin is warm and dry.  Psychiatric: She has a normal mood and affect. Her behavior is normal.    There were no vitals taken for this visit. Wt Readings from Last 3 Encounters:  01/02/18 197 lb 12.8 oz (89.7 kg)  11/04/17 200 lb 12.8 oz (91.1 kg)  04/05/17 198 lb (89.8 kg)   BP Readings from Last 3 Encounters:  01/02/18 130/68  11/04/17 140/60  04/05/17 (!) 116/59     Immunization History  Administered Date(s) Administered  . Influenza,inj,Quad PF,6+ Mos 09/14/2013  . Pneumococcal Conjugate-13 09/12/2015  . Pneumococcal Polysaccharide-23 06/05/2012  . Tdap 12/27/2003, 03/15/2014    Health Maintenance  Topic Date Due  . Hepatitis C Screening  [birth year]  . PAP SMEAR  04/24/2012  . INFLUENZA VACCINE  07/24/2017  . COLONOSCOPY  12/23/2018 (Originally 09/28/2017)  . MAMMOGRAM  10/09/2019  . TETANUS/TDAP   03/15/2024  . DEXA SCAN  Completed  . PNA vac Low Risk Adult  Completed    Lab Results  Component Value Date   WBC 9.1 11/04/2017   HGB 13.3 11/04/2017   HCT 39.1 11/04/2017   PLT 259 11/04/2017   GLUCOSE 77 11/04/2017   CHOL 88 (L) 02/10/2018   TRIG 116 02/10/2018  HDL 41 02/10/2018   LDLDIRECT 83.8 08/11/2012   LDLCALC 24 02/10/2018   ALT 16 02/10/2018   AST 14 02/10/2018   NA 145 (H) 11/04/2017   K 4.2 11/04/2017   CL 103 11/04/2017   CREATININE 0.96 11/04/2017   BUN 20 11/04/2017   CO2 26 11/04/2017   TSH 1.11 04/05/2017   INR 0.86 03/07/2016   HGBA1C 5.9 05/07/2016    Lab Results  Component Value Date   TSH 1.11 04/05/2017   Lab Results  Component Value Date   WBC 9.1 11/04/2017   HGB 13.3 11/04/2017   HCT 39.1 11/04/2017   MCV 82 11/04/2017   PLT 259 11/04/2017   Lab Results  Component Value Date   NA 145 (H) 11/04/2017   K 4.2 11/04/2017   CO2 26 11/04/2017   GLUCOSE 77 11/04/2017   BUN 20 11/04/2017   CREATININE 0.96 11/04/2017   BILITOT 0.3 02/10/2018   ALKPHOS 93 02/10/2018   AST 14 02/10/2018   ALT 16 02/10/2018   PROT 6.6 02/10/2018   ALBUMIN 4.0 02/10/2018   CALCIUM 8.4 (L) 11/04/2017   ANIONGAP 9 03/25/2017   GFR 46.74 (L) 04/05/2017   Lab Results  Component Value Date   CHOL 88 (L) 02/10/2018   Lab Results  Component Value Date   HDL 41 02/10/2018   Lab Results  Component Value Date   LDLCALC 24 02/10/2018   Lab Results  Component Value Date   TRIG 116 02/10/2018   Lab Results  Component Value Date   CHOLHDL 2.1 02/10/2018   Lab Results  Component Value Date   HGBA1C 5.9 05/07/2016         Assessment & Plan:   Problem List Items Addressed This Visit    Hypothyroidism    On Levothyroxine, continue to monitor      Relevant Medications   levothyroxine (SYNTHROID, LEVOTHROID) 100 MCG tablet   Other Relevant Orders   TSH   Hyperlipidemia    Tolerating statin, encouraged heart healthy diet, avoid trans fats,  minimize simple carbs and saturated fats. Increase exercise as tolerated      Relevant Orders   Lipid panel   Essential hypertension, benign    Well controlled, no changes to meds. Encouraged heart healthy diet such as the DASH diet and exercise as tolerated.       Relevant Orders   CBC   Comprehensive metabolic panel   TSH   Hypocalcemia    Check cmp and vitamin d level      Relevant Orders   VITAMIN D 25 Hydroxy (Vit-D Deficiency, Fractures)   Tension headache    Worsening headache with a throbbing sensation behind her left eye she has had an evaluatin with opthamology and no cause in eye found is set up for MR to r/o any concernng lesions. Given Alprazolam to use prn for the MR      Postsurgical hypoparathyroidism ([facility])   Relevant Medications   calcitRIOL (ROCALTROL) 0.25 MCG capsule   Sun-damaged skin   Relevant Orders   Ambulatory referral to Dermatology   Preventative health care    Patient encouraged to maintain heart healthy diet, regular exercise, adequate sleep. Consider daily probiotics. Take medications as prescribed      Zinc deficiency    Check level today      Relevant Orders   Zinc   Right foot pain    With some thickening of her toenails as well, referred to podiatry for further consideration  Relevant Orders   Ambulatory referral to Podiatry   Post-menopausal bleeding - Primary    Just occurred this past weekend. Occurred twice will refer to GYN for further consideration      Relevant Orders   Ambulatory referral to Obstetrics / Gynecology   Cephalalgia   Relevant Orders   MR BRAIN WO CONTRAST   MR Angiogram Head Wo Contrast   Anxiety   Relevant Medications   ALPRAZolam (XANAX) 0.25 MG tablet    Other Visit Diagnoses    Onychomycosis       Relevant Orders   Ambulatory referral to Podiatry   Nonintractable headache, unspecified chronicity pattern, unspecified headache type       Relevant Orders   MR Angiogram Head Wo Contrast       I am having [name] start on ALPRAZolam. I am also having her maintain her calcium carbonate, aspirin EC, B-complex with vitamin C, cholecalciferol, (Menthol, Topical Analgesic, (ICY HOT EX)), methocarbamol, pantoprazole, gabapentin, oxyCODONE-acetaminophen, multivitamin with minerals, nitroGLYCERIN, famotidine, Evolocumab, amLODipine, clopidogrel, isosorbide mononitrate, rosuvastatin, carvedilol, levothyroxine, and calcitRIOL.  Meds ordered this encounter  Medications  . levothyroxine (SYNTHROID, LEVOTHROID) 100 MCG tablet    Sig: TAKE 1 TABLET(100 MCG) BY MOUTH DAILY    Dispense:  90 tablet    Refill:  0  . calcitRIOL (ROCALTROL) 0.25 MCG capsule    Sig: TAKE 3 CAPSULES(0.75 MCG) BY MOUTH DAILY    Dispense:  270 capsule    Refill:  0  . ALPRAZolam (XANAX) 0.25 MG tablet    Sig: Take 1 tablet (0.25 mg total) by mouth 2 (two) times daily as needed for anxiety.    Dispense:  5 tablet    Refill:  0    CMA served as scribe during this visit. History, Physical and Plan performed by medical provider. Documentation and orders reviewed and attested to.  Penni Homans, MD

## 2018-02-17 NOTE — Assessment & Plan Note (Signed)
With some thickening of her toenails as well, referred to podiatry for further consideration

## 2018-02-17 NOTE — Assessment & Plan Note (Signed)
Well controlled, no changes to meds. Encouraged heart healthy diet such as the DASH diet and exercise as tolerated.  °

## 2018-02-17 NOTE — Assessment & Plan Note (Signed)
Tolerating statin, encouraged heart healthy diet, avoid trans fats, minimize simple carbs and saturated fats. Increase exercise as tolerated 

## 2018-02-18 LAB — COMPREHENSIVE METABOLIC PANEL
ALBUMIN: 3.9 g/dL (ref 3.5–5.2)
ALK PHOS: 78 U/L (ref 39–117)
ALT: 15 U/L (ref 0–35)
AST: 17 U/L (ref 0–37)
BUN: 23 mg/dL (ref 6–23)
CO2: 30 mEq/L (ref 19–32)
CREATININE: 1.1 mg/dL (ref 0.40–1.20)
Calcium: 9 mg/dL (ref 8.4–10.5)
Chloride: 102 mEq/L (ref 96–112)
GFR: 52.04 mL/min — ABNORMAL LOW (ref >60.00)
Glucose, Bld: 92 mg/dL (ref 70–99)
POTASSIUM: 4.2 meq/L (ref 3.5–5.1)
Sodium: 141 mEq/L (ref 135–145)
TOTAL PROTEIN: 7.3 g/dL (ref 6.0–8.3)
Total Bilirubin: 0.4 mg/dL (ref 0.2–1.2)

## 2018-02-18 LAB — TSH: TSH: 0.35 u[IU]/mL (ref 0.35–4.50)

## 2018-02-18 LAB — LIPID PANEL
CHOLESTEROL: 79 mg/dL (ref 0–200)
HDL: 40.5 mg/dL (ref >39.00)
LDL Cholesterol: 8 mg/dL (ref 0–99)
NonHDL: 38.41
Total CHOL/HDL Ratio: 2
Triglycerides: 153 mg/dL — ABNORMAL HIGH (ref 0.0–149.0)
VLDL: 30.6 mg/dL (ref 0.0–40.0)

## 2018-02-18 LAB — CBC
HEMATOCRIT: 39.8 % (ref 36.0–46.0)
Hemoglobin: 13.4 g/dL (ref 12.0–15.0)
MCHC: 33.6 g/dL (ref 30.0–36.0)
MCV: 83.7 fl (ref 78.0–100.0)
PLATELETS: 283 10*3/uL (ref 150.0–400.0)
RBC: 4.75 Mil/uL (ref 3.87–5.11)
RDW: 15 % (ref 11.5–15.5)
WBC: 9.8 10*3/uL (ref 4.0–10.5)

## 2018-02-18 LAB — VITAMIN D 25 HYDROXY (VIT D DEFICIENCY, FRACTURES): VITD: 42.38 ng/mL (ref 30.00–100.00)

## 2018-02-19 LAB — ZINC: ZINC: 72 ug/dL (ref 60–130)

## 2018-02-22 ENCOUNTER — Ambulatory Visit (HOSPITAL_BASED_OUTPATIENT_CLINIC_OR_DEPARTMENT_OTHER): Payer: Medicare Other

## 2018-03-06 ENCOUNTER — Inpatient Hospital Stay (HOSPITAL_COMMUNITY)
Admission: EM | Admit: 2018-03-06 | Discharge: 2018-03-08 | DRG: 871 | Disposition: A | Discharge status: Home / Self Care | Payer: Medicare Other | Attending: Internal Medicine | Admitting: Internal Medicine

## 2018-03-06 ENCOUNTER — Emergency Department (HOSPITAL_COMMUNITY): Payer: Medicare Other

## 2018-03-06 ENCOUNTER — Encounter (HOSPITAL_COMMUNITY): Payer: Self-pay | Admitting: Emergency Medicine

## 2018-03-06 ENCOUNTER — Other Ambulatory Visit: Payer: Self-pay

## 2018-03-06 DIAGNOSIS — Z7902 Long term (current) use of antithrombotics/antiplatelets: Secondary | ICD-10-CM

## 2018-03-06 DIAGNOSIS — A084 Viral intestinal infection, unspecified: Secondary | ICD-10-CM | POA: Diagnosis present

## 2018-03-06 DIAGNOSIS — J189 Pneumonia, unspecified organism: Secondary | ICD-10-CM

## 2018-03-06 DIAGNOSIS — E039 Hypothyroidism, unspecified: Secondary | ICD-10-CM

## 2018-03-06 DIAGNOSIS — K219 Gastro-esophageal reflux disease without esophagitis: Secondary | ICD-10-CM | POA: Diagnosis present

## 2018-03-06 DIAGNOSIS — E209 Hypoparathyroidism, unspecified: Secondary | ICD-10-CM | POA: Diagnosis present

## 2018-03-06 DIAGNOSIS — F419 Anxiety disorder, unspecified: Secondary | ICD-10-CM | POA: Diagnosis present

## 2018-03-06 DIAGNOSIS — I10 Essential (primary) hypertension: Secondary | ICD-10-CM

## 2018-03-06 DIAGNOSIS — D649 Anemia, unspecified: Secondary | ICD-10-CM | POA: Diagnosis present

## 2018-03-06 DIAGNOSIS — I4510 Unspecified right bundle-branch block: Secondary | ICD-10-CM | POA: Diagnosis present

## 2018-03-06 DIAGNOSIS — I119 Hypertensive heart disease without heart failure: Secondary | ICD-10-CM | POA: Diagnosis present

## 2018-03-06 DIAGNOSIS — F32A Depression, unspecified: Secondary | ICD-10-CM | POA: Diagnosis present

## 2018-03-06 DIAGNOSIS — E7849 Other hyperlipidemia: Secondary | ICD-10-CM | POA: Diagnosis not present

## 2018-03-06 DIAGNOSIS — Z955 Presence of coronary angioplasty implant and graft: Secondary | ICD-10-CM

## 2018-03-06 DIAGNOSIS — I2510 Atherosclerotic heart disease of native coronary artery without angina pectoris: Secondary | ICD-10-CM | POA: Diagnosis not present

## 2018-03-06 DIAGNOSIS — F320 Major depressive disorder, single episode, mild: Secondary | ICD-10-CM | POA: Diagnosis not present

## 2018-03-06 DIAGNOSIS — D500 Iron deficiency anemia secondary to blood loss (chronic): Secondary | ICD-10-CM | POA: Diagnosis not present

## 2018-03-06 DIAGNOSIS — H8110 Benign paroxysmal vertigo, unspecified ear: Secondary | ICD-10-CM | POA: Diagnosis not present

## 2018-03-06 DIAGNOSIS — Z888 Allergy status to other drugs, medicaments and biological substances status: Secondary | ICD-10-CM | POA: Diagnosis not present

## 2018-03-06 DIAGNOSIS — A419 Sepsis, unspecified organism: Secondary | ICD-10-CM | POA: Diagnosis not present

## 2018-03-06 DIAGNOSIS — F329 Major depressive disorder, single episode, unspecified: Secondary | ICD-10-CM | POA: Diagnosis present

## 2018-03-06 DIAGNOSIS — Z7982 Long term (current) use of aspirin: Secondary | ICD-10-CM

## 2018-03-06 DIAGNOSIS — R55 Syncope and collapse: Secondary | ICD-10-CM | POA: Diagnosis present

## 2018-03-06 DIAGNOSIS — E876 Hypokalemia: Secondary | ICD-10-CM

## 2018-03-06 DIAGNOSIS — E785 Hyperlipidemia, unspecified: Secondary | ICD-10-CM | POA: Diagnosis present

## 2018-03-06 DIAGNOSIS — Z8249 Family history of ischemic heart disease and other diseases of the circulatory system: Secondary | ICD-10-CM

## 2018-03-06 DIAGNOSIS — Z79890 Hormone replacement therapy (postmenopausal): Secondary | ICD-10-CM

## 2018-03-06 DIAGNOSIS — R079 Chest pain, unspecified: Secondary | ICD-10-CM | POA: Diagnosis not present

## 2018-03-06 DIAGNOSIS — J180 Bronchopneumonia, unspecified organism: Secondary | ICD-10-CM | POA: Diagnosis present

## 2018-03-06 DIAGNOSIS — I252 Old myocardial infarction: Secondary | ICD-10-CM | POA: Diagnosis not present

## 2018-03-06 DIAGNOSIS — J181 Lobar pneumonia, unspecified organism: Secondary | ICD-10-CM | POA: Diagnosis not present

## 2018-03-06 DIAGNOSIS — E890 Postprocedural hypothyroidism: Secondary | ICD-10-CM | POA: Diagnosis not present

## 2018-03-06 DIAGNOSIS — Z79899 Other long term (current) drug therapy: Secondary | ICD-10-CM

## 2018-03-06 DIAGNOSIS — Z8349 Family history of other endocrine, nutritional and metabolic diseases: Secondary | ICD-10-CM | POA: Diagnosis not present

## 2018-03-06 LAB — CBC WITH DIFFERENTIAL/PLATELET
BASOS ABS: 0 10*3/uL (ref 0.0–0.1)
Basophils Relative: 0 %
EOS ABS: 0.4 10*3/uL (ref 0.0–0.7)
EOS PCT: 3 %
HCT: 42.1 % (ref 36.0–46.0)
HEMOGLOBIN: 14 g/dL (ref 12.0–15.0)
Lymphocytes Relative: 14 %
Lymphs Abs: 2.3 10*3/uL (ref 0.7–4.0)
MCH: 28.7 pg (ref 26.0–34.0)
MCHC: 33.3 g/dL (ref 30.0–36.0)
MCV: 86.4 fL (ref 78.0–100.0)
Monocytes Absolute: 1 10*3/uL (ref 0.1–1.0)
Monocytes Relative: 6 %
NEUTROS PCT: 77 %
Neutro Abs: 13.2 10*3/uL — ABNORMAL HIGH (ref 1.7–7.7)
PLATELETS: 236 10*3/uL (ref 150–400)
RBC: 4.87 MIL/uL (ref 3.87–5.11)
RDW: 14.9 % (ref 11.5–15.5)
WBC: 16.9 10*3/uL — ABNORMAL HIGH (ref 4.0–10.5)

## 2018-03-06 LAB — URINALYSIS, ROUTINE W REFLEX MICROSCOPIC
Bilirubin Urine: NEGATIVE
Glucose, UA: NEGATIVE mg/dL
Hgb urine dipstick: NEGATIVE
KETONES UR: NEGATIVE mg/dL
LEUKOCYTES UA: NEGATIVE
NITRITE: NEGATIVE
PH: 7 (ref 5.0–8.0)
Protein, ur: NEGATIVE mg/dL
Specific Gravity, Urine: 1.028 (ref 1.005–1.030)

## 2018-03-06 LAB — COMPREHENSIVE METABOLIC PANEL
ALBUMIN: 3.8 g/dL (ref 3.5–5.0)
ALT: 26 U/L (ref 14–54)
AST: 29 U/L (ref 15–41)
Alkaline Phosphatase: 81 U/L (ref 38–126)
Anion gap: 13 (ref 5–15)
BUN: 27 mg/dL — AB (ref 6–20)
CHLORIDE: 104 mmol/L (ref 101–111)
CO2: 22 mmol/L (ref 22–32)
CREATININE: 1.26 mg/dL — AB (ref 0.44–1.00)
Calcium: 8.6 mg/dL — ABNORMAL LOW (ref 8.9–10.3)
GFR calc non Af Amer: 42 mL/min — ABNORMAL LOW (ref >60)
GFR, EST AFRICAN AMERICAN: 48 mL/min — AB (ref >60)
GLUCOSE: 154 mg/dL — AB (ref 65–99)
Potassium: 4.4 mmol/L (ref 3.5–5.1)
SODIUM: 139 mmol/L (ref 135–145)
Total Bilirubin: 0.7 mg/dL (ref 0.3–1.2)
Total Protein: 7.5 g/dL (ref 6.5–8.1)

## 2018-03-06 LAB — I-STAT TROPONIN, ED: Troponin i, poc: 0 ng/mL (ref 0.00–0.08)

## 2018-03-06 LAB — CBG MONITORING, ED: Glucose-Capillary: 141 mg/dL — ABNORMAL HIGH (ref 65–99)

## 2018-03-06 LAB — INFLUENZA PANEL BY PCR (TYPE A & B)
INFLBPCR: NEGATIVE
Influenza A By PCR: NEGATIVE

## 2018-03-06 LAB — TSH: TSH: 0.159 u[IU]/mL — ABNORMAL LOW (ref 0.350–4.500)

## 2018-03-06 LAB — MAGNESIUM: Magnesium: 1.9 mg/dL (ref 1.7–2.4)

## 2018-03-06 LAB — I-STAT CG4 LACTIC ACID, ED: Lactic Acid, Venous: 1.19 mmol/L (ref 0.5–1.9)

## 2018-03-06 LAB — D-DIMER, QUANTITATIVE: D-Dimer, Quant: 2.29 ug/mL-FEU — ABNORMAL HIGH (ref 0.00–0.50)

## 2018-03-06 MED ORDER — ONDANSETRON HCL 4 MG/2ML IJ SOLN
INTRAMUSCULAR | Status: AC
  Filled 2018-03-06: qty 2

## 2018-03-06 MED ORDER — CARVEDILOL 3.125 MG PO TABS
3.1250 mg | ORAL_TABLET | Freq: Two times a day (BID) | ORAL | Status: DC
  Administered 2018-03-06 – 2018-03-08 (×4): 3.125 mg via ORAL
  Filled 2018-03-06 (×4): qty 1

## 2018-03-06 MED ORDER — IOPAMIDOL (ISOVUE-370) INJECTION 76%
INTRAVENOUS | Status: AC
  Administered 2018-03-06: 80 mL
  Filled 2018-03-06: qty 100

## 2018-03-06 MED ORDER — SODIUM CHLORIDE 0.9 % IV SOLN
500.0000 mg | INTRAVENOUS | Status: DC
  Administered 2018-03-07 – 2018-03-08 (×2): 500 mg via INTRAVENOUS
  Filled 2018-03-06 (×2): qty 500

## 2018-03-06 MED ORDER — SODIUM CHLORIDE 0.9 % IV SOLN
500.0000 mg | Freq: Once | INTRAVENOUS | Status: AC
  Administered 2018-03-06: 500 mg via INTRAVENOUS
  Filled 2018-03-06: qty 500

## 2018-03-06 MED ORDER — SODIUM CHLORIDE 0.9 % IV BOLUS (SEPSIS)
500.0000 mL | Freq: Once | INTRAVENOUS | Status: AC
  Administered 2018-03-06: 500 mL via INTRAVENOUS

## 2018-03-06 MED ORDER — SODIUM CHLORIDE 0.9 % IV SOLN
Freq: Once | INTRAVENOUS | Status: AC
  Administered 2018-03-06: 12:00:00 via INTRAVENOUS

## 2018-03-06 MED ORDER — ACETAMINOPHEN 650 MG RE SUPP: 650.0000 mg | Freq: Four times a day (QID) | RECTAL | Status: DC | PRN

## 2018-03-06 MED ORDER — ROSUVASTATIN CALCIUM 20 MG PO TABS
40.0000 mg | ORAL_TABLET | Freq: Every day | ORAL | Status: DC
  Administered 2018-03-06 – 2018-03-08 (×3): 40 mg via ORAL
  Filled 2018-03-06 (×3): qty 2

## 2018-03-06 MED ORDER — CLOPIDOGREL BISULFATE 75 MG PO TABS
75.0000 mg | ORAL_TABLET | Freq: Every day | ORAL | Status: DC
  Administered 2018-03-06 – 2018-03-08 (×3): 75 mg via ORAL
  Filled 2018-03-06 (×3): qty 1

## 2018-03-06 MED ORDER — LEVOTHYROXINE SODIUM 100 MCG PO TABS
100.0000 ug | ORAL_TABLET | Freq: Every day | ORAL | Status: DC
  Administered 2018-03-07: 100 ug via ORAL
  Filled 2018-03-06: qty 1

## 2018-03-06 MED ORDER — ACETAMINOPHEN 325 MG PO TABS
650.0000 mg | ORAL_TABLET | Freq: Four times a day (QID) | ORAL | Status: DC | PRN
  Administered 2018-03-06 – 2018-03-08 (×2): 650 mg via ORAL
  Filled 2018-03-06 (×2): qty 2

## 2018-03-06 MED ORDER — PANTOPRAZOLE SODIUM 40 MG PO TBEC
40.0000 mg | DELAYED_RELEASE_TABLET | Freq: Every day | ORAL | Status: DC
  Administered 2018-03-06 – 2018-03-08 (×3): 40 mg via ORAL
  Filled 2018-03-06 (×3): qty 1

## 2018-03-06 MED ORDER — SODIUM CHLORIDE 0.9 % IV SOLN
1.0000 g | INTRAVENOUS | Status: DC
  Administered 2018-03-07 – 2018-03-08 (×2): 1 g via INTRAVENOUS
  Filled 2018-03-06 (×2): qty 10

## 2018-03-06 MED ORDER — ONDANSETRON HCL 4 MG/2ML IJ SOLN
4.0000 mg | Freq: Once | INTRAMUSCULAR | Status: AC
  Administered 2018-03-06: 4 mg via INTRAVENOUS

## 2018-03-06 MED ORDER — ASPIRIN EC 81 MG PO TBEC
81.0000 mg | DELAYED_RELEASE_TABLET | Freq: Every day | ORAL | Status: DC
  Administered 2018-03-06 – 2018-03-08 (×3): 81 mg via ORAL
  Filled 2018-03-06 (×3): qty 1

## 2018-03-06 MED ORDER — SODIUM CHLORIDE 0.9 % IV SOLN
1.0000 g | Freq: Once | INTRAVENOUS | Status: AC
  Administered 2018-03-06: 1 g via INTRAVENOUS
  Filled 2018-03-06: qty 10

## 2018-03-06 MED ORDER — SODIUM CHLORIDE 0.9 % IV BOLUS (SEPSIS): 500.0000 mL | Freq: Once | INTRAVENOUS | Status: AC

## 2018-03-06 MED ORDER — ONDANSETRON HCL 4 MG PO TABS: 4.0000 mg | ORAL_TABLET | Freq: Four times a day (QID) | ORAL | Status: DC | PRN

## 2018-03-06 MED ORDER — ACETAMINOPHEN 500 MG PO TABS
1000.0000 mg | ORAL_TABLET | Freq: Once | ORAL | Status: AC
  Administered 2018-03-06: 1000 mg via ORAL
  Filled 2018-03-06: qty 2

## 2018-03-06 MED ORDER — HEPARIN SODIUM (PORCINE) 5000 UNIT/ML IJ SOLN
5000.0000 [IU] | Freq: Three times a day (TID) | INTRAMUSCULAR | Status: DC
  Administered 2018-03-06 – 2018-03-08 (×5): 5000 [IU] via SUBCUTANEOUS
  Filled 2018-03-06 (×4): qty 1

## 2018-03-06 MED ORDER — LEVALBUTEROL HCL 0.63 MG/3ML IN NEBU: 0.6300 mg | INHALATION_SOLUTION | Freq: Four times a day (QID) | RESPIRATORY_TRACT | Status: DC | PRN

## 2018-03-06 MED ORDER — ONDANSETRON HCL 4 MG/2ML IJ SOLN: 4.0000 mg | Freq: Four times a day (QID) | INTRAMUSCULAR | Status: DC | PRN

## 2018-03-06 NOTE — ED Triage Notes (Signed)
Pt brought down to ED by [facility] from upstairs where pt was staying with her family member. Pt had syncopal episode, N/V, CP, diaphoresis. Pt noted to be hypotensive in triage, bradycardic. Taken to room/

## 2018-03-06 NOTE — ED Notes (Signed)
Admitting at bedside 

## 2018-03-06 NOTE — ED Provider Notes (Signed)
[facility] EMERGENCY DEPARTMENT Provider Note   CSN: [id number] Arrival date & time: 03/06/18  5053     History   Chief Complaint Chief Complaint  Patient presents with  . Chest Pain    HPI [name] is a 72 y.o. adult.  The history is provided by the patient and medical records. No language interpreter was used.  Chest Pain   Associated symptoms include diaphoresis, nausea, vomiting and weakness. Pertinent negatives include no abdominal pain, no fever, no palpitations and no shortness of breath.   [name] is a 72 y.o. adult  with a PMH of prior MI with stent placement, HTN, HLD, GERD who presents to the Emergency Department for near-syncopal episode. Patient was upstairs visiting a family member in the hospital.  She states that she was sitting down when she suddenly felt diaphoretic and nauseous.  Per patient and family member at bedside, she did not have syncopal episode, but was "in and out of it".  Patient reports her vision changing and feeling as if she was pass out, but denied.  She then had 3 episodes of emesis and 3 nonbloody loose stools.  She was brought down by rapid response for further evaluation.  She was hypotensive 67/46 upstairs.  When lying flat upon initial evaluation, blood pressure normalized at 115/64.  Does report history of prior MI with stent placement.  She did experience chest pain which she described as a "burning".  She states this feels very different than her cardiac chest pain in the past.  No medications taken for symptoms.  Denies alleviating or aggravating factors.  Denies history of similar.  Denies known sick contacts, but has been visiting family in the hospital for 2 days.  Denies any recent travel.  Past Medical History:  Diagnosis Date  . Acute MI, subendocardial ([place])   . Back pain 05/31/2013  . Benign paroxysmal positional vertigo 08/05/2016  . CAD (coronary artery disease)    a. NSTEMI 10/2011: [place] 11/19/11:  pLAD 30%, oOM 60%, AVCFX 30%, CFX after OM2 30%, pRCA 60 and 70%, then 99%, AM 80-90% with TIMI 3 flow.  PCI: Promus DES x 2 to RCA; b. 06/2012 Cath: patent RCA stents w/ subtl occl of Acute Marginal (jailed)->Med rx; c. 05/2015 MV: EF 59%, mod mid infsept/inf/ap lat/ap infarct with peri-infarct isch-->Med Rx; d. 02/2016 Cath: LM nl, LAD 16m, RI 50, RCA patent stents.  . Colitis   . Facial skin lesion 01/28/2017  . GERD (gastroesophageal reflux disease)    occasional  . Hyperlipidemia   . Hypertension   . Hypertensive heart disease    a. Echocardiogram 11/19/11: Difficult acoustic windows, EF 60-65%, normal LV wall thickness, grade 1 diastolic dysfunction  . Hypoparathyroidism ([place])   . Low zinc level 11/26/2016  . Multinodular thyroid    Goiter s/p thyroidectomy in 2007 with post-op  hypocalcemia and post-op hypothyroidism/hypoparathyroidism with hypocalcemia  . Neck pain on right side 07/13/2013  . Nocturia 05/31/2013  . Pain in right axilla 03/13/2016  . Palpitations    a. 03/2012 - patient set up for event monitor but did not wear correctly -  she declined wearing a repeat monitor  . Post-surgical hypothyroidism   . Sun-damaged skin 12/05/2014  . Tubular adenoma of colon 06/2011  . Unspecified constipation 05/31/2013  . Vertigo   . Zinc deficiency 11/26/2015    Patient Active Problem List   Diagnosis Date Noted  . CAP (community acquired pneumonia) 03/06/2018  . Right  foot pain 02/17/2018  . Post-menopausal bleeding 02/17/2018  . Cephalalgia 02/17/2018  . Anxiety 02/17/2018  . Peripheral arterial disease ([place]) 02/20/2017  . Facial skin lesion 01/28/2017  . Benign paroxysmal positional vertigo 08/05/2016  . Atypical chest pain 03/27/2016  . Esophageal reflux 03/27/2016  . Antiplatelet or antithrombotic long-term use 03/27/2016  . Pain in right axilla 03/13/2016  . Chest pain 03/07/2016  . Right lumbar radiculopathy 02/21/2016  . Abnormality of gait 02/21/2016  . Zinc deficiency  11/26/2015  . Chronic low back pain 10/26/2015  . Right hip pain 10/26/2015  . Medicare annual wellness visit, subsequent 03/13/2015  . Preventative health care 03/13/2015  . Sun-damaged skin 12/05/2014  . Angina of effort ([place]) 12/02/2014  . Hx of colonic polyps 09/28/2014  . Benign neoplasm of descending colon 09/28/2014  . Other fatigue 09/10/2014  . Personal history of colonic polyps 07/16/2014  . Bilateral hand pain 03/20/2014  . Postsurgical hypoparathyroidism ([place]) 08/03/2013  . Neck pain on right side 07/13/2013  . Constipation 05/31/2013  . Back pain 05/31/2013  . Nocturia 05/31/2013  . GERD (gastroesophageal reflux disease) 11/02/2012  . Tension headache 11/02/2012  . Anemia 06/11/2012  . Hypokalemia 06/11/2012  . Depression 01/21/2012  . Headache(784.0) 12/23/2011  . Bradycardia 12/07/2011  . Hypocalcemia 12/07/2011  . Acute MI, subendocardial ([place]) 11/20/2011  . Hypothyroidism 05/20/2009  . Essential hypertension, benign 05/20/2009  . Hyperlipidemia 03/08/2009  . CAD, NATIVE VESSEL 03/08/2009    Past Surgical History:  Procedure Laterality Date  . CARDIAC CATHETERIZATION N/A 03/08/2016   Procedure: Left Heart Cath and Coronary Angiography;  Surgeon: Jettie Booze, MD;  Location: [facility];  Service: Cardiovascular;  Laterality: N/A;  . COLONOSCOPY  Aenomatous polyps   07/05/2011  . COLONOSCOPY N/A 09/28/2014   Procedure: COLONOSCOPY;  Surgeon: Ladene Artist, MD;  Location: [facility];  Service: Endoscopy;  Laterality: N/A;  . CORONARY ANGIOPLASTY WITH STENT PLACEMENT    . LEFT HEART CATHETERIZATION WITH CORONARY ANGIOGRAM N/A 07/17/2012   Procedure: LEFT HEART CATHETERIZATION WITH CORONARY ANGIOGRAM;  Surgeon: Hillary Bow, MD;  Location: [facility] CATH LAB;  Service: Cardiovascular;  Laterality: N/A;  . PERCUTANEOUS CORONARY STENT INTERVENTION (PCI-S) N/A 11/19/2011   Procedure: PERCUTANEOUS CORONARY STENT INTERVENTION (PCI-S);  Surgeon: Hillary Bow, MD;  Location: [facility] CATH LAB;  Service: Cardiovascular;  Laterality: N/A;  . SHOULDER ARTHROSCOPY W/ ROTATOR CUFF REPAIR     right  . TOTAL THYROIDECTOMY  2007   [facility]    OB History    Gravida Para Term Preterm AB Living   4 4 4     4    SAB TAB Ectopic Multiple Live Births           4       Home Medications    Prior to Admission medications   Medication Sig Start Date End Date Taking? Authorizing Provider  ALPRAZolam (XANAX) 0.25 MG tablet Take 1 tablet (0.25 mg total) by mouth 2 (two) times daily as needed for anxiety. 02/17/18   Mosie Lukes, MD  amLODipine (NORVASC) 10 MG tablet TAKE 1 TABLET(10 MG) BY MOUTH DAILY 01/02/18   Fay Records, MD  aspirin EC 81 MG tablet Take 81 mg by mouth daily.   11/22/11   Dunn, Dayna N, PA-C  B Complex-C (B-COMPLEX WITH VITAMIN C) tablet Take 1 tablet by mouth daily.    [provider]  calcitRIOL (ROCALTROL) 0.25 MCG capsule TAKE 3 CAPSULES(0.75 MCG) BY MOUTH DAILY 02/17/18  Mosie Lukes, MD  calcium carbonate (TUMS - DOSED IN MG ELEMENTAL CALCIUM) 500 MG chewable tablet Chew 4 tablets by mouth 2 (two) times daily.     [provider]  carvedilol (COREG) 3.125 MG tablet Take 1 tablet (3.125 mg total) by mouth 2 (two) times daily. 01/02/18   Fay Records, MD  cholecalciferol (VITAMIN D) 1000 UNITS tablet Take 1,000 Units by mouth daily.    [provider]  clopidogrel (PLAVIX) 75 MG tablet Take 1 tablet (75 mg total) by mouth daily. 01/02/18   Fay Records, MD  Evolocumab (REPATHA SURECLICK) 081 MG/ML SOAJ Inject 140 mg into the skin every 14 (fourteen) days. 12/10/17   Fay Records, MD  famotidine (PEPCID) 20 MG tablet Take 20 mg by mouth 2 (two) times daily as needed for heartburn or indigestion.    [provider]  gabapentin (NEURONTIN) 300 MG capsule Take 1 capsule by mouth 3 (three) times daily as needed. 12/25/16   [provider]  isosorbide mononitrate (IMDUR) 60 MG 24 hr tablet TAKE  1 AND 1/2 TABLETS(90 MG) BY MOUTH DAILY 01/02/18   Fay Records, MD  levothyroxine (SYNTHROID, LEVOTHROID) 100 MCG tablet TAKE 1 TABLET(100 MCG) BY MOUTH DAILY 02/17/18   Mosie Lukes, MD  Menthol, Topical Analgesic, (ICY HOT EX) Apply 1 patch topically daily.    [provider]  methocarbamol (ROBAXIN) 500 MG tablet Take 2 tablets (1,000 mg total) by mouth every 8 (eight) hours as needed (shoulder pain). 03/15/16   Julianne Rice, MD  Multiple Vitamin (MULTIVITAMIN WITH MINERALS) TABS tablet Take 1 tablet by mouth daily.    [provider]  nitroGLYCERIN (NITROSTAT) 0.4 MG SL tablet DISSOLVE 1 TABLET UNDER THE TONGUE EVERY 5 MINUTES AS NEEDED FOR CHEST PAIN AS DIRECTED 03/28/17   Mosie Lukes, MD  oxyCODONE-acetaminophen (PERCOCET) 10-325 MG tablet Take 1 tablet by mouth 4 (four) times daily as needed. 12/25/16   [provider]  pantoprazole (PROTONIX) 40 MG tablet Take 1 tablet (40 mg total) by mouth daily. 11/26/16   Mosie Lukes, MD  rosuvastatin (CRESTOR) 40 MG tablet Take 1 tablet (40 mg total) by mouth daily. 01/02/18   Fay Records, MD    Family History Family History  Problem Relation Age of Onset  . Colon cancer Brother   . Cancer Brother        COLON  . Hypertension Father   . Heart disease Father   . Heart attack Father   . Blindness Sister   . Congestive Heart Failure Sister   . Hypertension Sister   . Thyroid disease Sister   . Cancer Brother        multiple myelomas  . Diabetes Neg Hx   . Prostate cancer Neg Hx   . Breast cancer Neg Hx     Social History Social History   Tobacco Use  . Smoking status: Never Smoker  . Smokeless tobacco: Never Used  Substance Use Topics  . Alcohol use: No  . Drug use: No     Allergies   Zetia [ezetimibe]   Review of Systems Review of Systems  Constitutional: Positive for diaphoresis. Negative for fever.  Respiratory: Negative for shortness of breath.   Cardiovascular: Positive for chest  pain. Negative for palpitations and leg swelling.  Gastrointestinal: Positive for diarrhea, nausea and vomiting. Negative for abdominal pain.  Neurological: Positive for weakness and light-headedness.  All other systems reviewed and are negative.  Physical Exam Updated Vital Signs BP (!) 122/58   Pulse 76   Temp (!) 102 F (38.9 C) (Oral)   Resp (!) 21   Ht 5\' 5"  (1.651 m)   Wt 86.2 kg (190 lb)   SpO2 95%   BMI 31.62 kg/m   Physical Exam  Constitutional: She is oriented to person, place, and time. She appears well-developed and well-nourished. No distress.  HENT:  Head: Normocephalic and atraumatic.  Neck: Neck supple. No JVD present.  Cardiovascular: Normal rate, regular rhythm and normal heart sounds.  No murmur heard. Pulmonary/Chest: Effort normal and breath sounds normal. No respiratory distress.  Abdominal: Soft. Bowel sounds are normal. She exhibits no distension.  No abdominal tenderness.  Musculoskeletal: She exhibits no edema.  Neurological: She is alert and oriented to person, place, and time.  Skin: Skin is warm and dry.  Nursing note and vitals reviewed.    ED Treatments / Results  Labs (all labs ordered are listed, but only abnormal results are displayed) Labs Reviewed  CBC WITH DIFFERENTIAL/PLATELET - Abnormal; Notable for the following components:      Result Value   WBC 16.9 (*)    Neutro Abs 13.2 (*)    All other components within normal limits  COMPREHENSIVE METABOLIC PANEL - Abnormal; Notable for the following components:   Glucose, Bld 154 (*)    BUN 27 (*)    Creatinine, Ser 1.26 (*)    Calcium 8.6 (*)    GFR calc non Af Amer 42 (*)    GFR calc Af Amer 48 (*)    All other components within normal limits  D-DIMER, QUANTITATIVE (NOT AT [facility]) - Abnormal; Notable for the following components:   D-Dimer, Quant 2.29 (*)    All other components within normal limits  CBG MONITORING, ED - Abnormal; Notable for the following components:    Glucose-Capillary 141 (*)    All other components within normal limits  CULTURE, BLOOD (ROUTINE X 2)  CULTURE, BLOOD (ROUTINE X 2)  URINALYSIS, ROUTINE W REFLEX MICROSCOPIC  CBC  CREATININE, SERUM  MAGNESIUM  TSH  URINALYSIS, ROUTINE W REFLEX MICROSCOPIC  I-STAT TROPONIN, ED  I-STAT CG4 LACTIC ACID, ED  I-STAT CG4 LACTIC ACID, ED    EKG  EKG Interpretation  Date/Time:  Thursday March 06 2018 06:54:50 EDT Ventricular Rate:  54 PR Interval:  164 QRS Duration: 128 QT Interval:  494 QTC Calculation: 468 R Axis:   21 Text Interpretation:  * Poor data quality, interpretation may be adversely affected Sinus bradycardia with Premature atrial complexes Right bundle branch block Abnormal ECG When compared with ECG of 03/25/2017, Right bundle branch block is now present Confirmed by Delora Fuel ([id number]) on 03/06/2018 7:05:12 AM       Radiology Ct Angio Chest Pe W And/or Wo Contrast  Result Date: 03/06/2018 CLINICAL DATA:  Syncopal episode.  Chest pain.  Diaphoresis. EXAM: CT ANGIOGRAPHY CHEST WITH CONTRAST TECHNIQUE: Multidetector CT imaging of the chest was performed using the standard protocol during bolus administration of intravenous contrast. Multiplanar CT image reconstructions and MIPs were obtained to evaluate the vascular anatomy. CONTRAST:  80 cc Isovue 370 COMPARISON:  Chest radiography same day FINDINGS: Cardiovascular: Pulmonary arterial opacification is good. No pulmonary emboli. Heart size is normal. No pericardial fluid. Aortic atherosclerosis and coronary artery calcification. Mediastinum/Nodes: No mass or lymphadenopathy. Lungs/Pleura: The right lung is clear except for mild patchy infiltrate in the right middle lobe. On the left, there is much more extensive infiltrate in  the left upper lobe consistent with bronchopneumonia. Mild patchy involvement of the left lower lobe. No lobar collapse. No effusion Upper Abdomen: Normal Musculoskeletal: Negative Review of the MIP images  confirms the above findings. IMPRESSION: No pulmonary emboli. Extensive bronchopneumonia in the left upper lobe. Patchy bronchopneumonia in the right middle lobe and right lower lobe. Aortic Atherosclerosis (ICD10-I70.0). Coronary artery calcification. Electronically Signed   By: Nelson Chimes M.D.   On: 03/06/2018 12:58   Dg Chest Portable 1 View  Result Date: 03/06/2018 CLINICAL DATA:  Syncope. Nausea vomiting. Chest pain. Diaphoresis. EXAM: PORTABLE CHEST 1 VIEW COMPARISON:  03/25/2017. FINDINGS: Mediastinum and hilar structures normal. Heart size normal. Low lung volumes. No focal infiltrate. No pleural effusion or pneumothorax. Surgical clips noted over the lower neck. No acute bony abnormality. IMPRESSION: Low lung volumes with mild basilar atelectasis. Electronically Signed   By: Marcello Moores  Register   On: 03/06/2018 07:44    Procedures Procedures (including critical care time)  Medications Ordered in ED Medications  azithromycin (ZITHROMAX) 500 mg in sodium chloride 0.9 % 250 mL IVPB (not administered)  heparin injection 5,000 Units (not administered)  acetaminophen (TYLENOL) tablet 650 mg (not administered)    Or  acetaminophen (TYLENOL) suppository 650 mg (not administered)  ondansetron (ZOFRAN) tablet 4 mg (not administered)    Or  ondansetron (ZOFRAN) injection 4 mg (not administered)  levalbuterol (XOPENEX) nebulizer solution 0.63 mg (not administered)  sodium chloride 0.9 % bolus 500 mL (0 mLs Intravenous Stopped 03/06/18 0810)  0.9 %  sodium chloride infusion ( Intravenous New Bag/Given 03/06/18 1224)  ondansetron (ZOFRAN) injection 4 mg (4 mg Intravenous Given 03/06/18 1058)  acetaminophen (TYLENOL) tablet 1,000 mg (1,000 mg Oral Given 03/06/18 1225)  iopamidol (ISOVUE-370) 76 % injection (80 mLs  Contrast Given 03/06/18 1241)  cefTRIAXone (ROCEPHIN) 1 g in sodium chloride 0.9 % 100 mL IVPB (1 g Intravenous New Bag/Given 03/06/18 1340)  sodium chloride 0.9 % bolus 500 mL (500 mLs  Intravenous New Bag/Given 03/06/18 1436)     Initial Impression / Assessment and Plan / ED Course  I have reviewed the triage vital signs and the nursing notes.  Pertinent labs & imaging results that were available during my care of the patient were reviewed by me and considered in my medical decision making (see chart for details).    [name] is a 72 y.o. adult who presents to ED initially for near syncopal episode with chest pain, nausea, vomiting and diaphoresis.  Initial blood pressure of 67/46.  We then laid the patient flat and rechecked blood pressure a few minutes later with a value of 115/64.  She has had no further hypotensive episodes while in the ED today.  She was given 500 cc bolus and then started on maintenance fluids.  EKG reviewed showing new right bundle branch block.  Troponin normal.  Chest x-ray with low lung volumes and mild atelectasis but no focal consolidation.  D-dimer elevated, therefore will proceed with CT angio.  While awaiting CT, patient began shivering and having further episodes of emesis.  Repeat temperature of 102.  Given Tylenol.  Lactic added which was WDL.  Blood cultures obtained.  CT shows no PE, but extensive bronchopneumonia in the left upper lobe and patchy areas to the right middle and lower lobes.  Started on azithromycin and Rocephin.  Hospitalist consulted who will admit.  Patient seen by and discussed with Dr. Johnney Killian who agrees with treatment plan.    Final Clinical Impressions(s) / ED  Diagnoses   Final diagnoses:  Multifocal pneumonia    ED Discharge Orders    None       Kaitlan Bin, Ozella Almond, PA-C 03/06/18 1443    Charlesetta Shanks, MD 03/12/18 [phone]

## 2018-03-06 NOTE — ED Notes (Signed)
Gave pt ice water, per Gabe - RN.

## 2018-03-06 NOTE — ED Notes (Signed)
Pt asked if she had experienced any diarrhea. Pt stated, "Yes". Informed Gabe - RN.

## 2018-03-06 NOTE — ED Notes (Addendum)
Pt placed on 1L of O2, per Rod Holler - RN.

## 2018-03-06 NOTE — ED Provider Notes (Signed)
Medical screening examination/treatment/procedure(s) were conducted as a shared visit with non-physician practitioner(s) and myself.  I personally evaluated the patient during the encounter.   EKG Interpretation  Date/Time:  Thursday March 06 2018 06:54:50 EDT Ventricular Rate:  54 PR Interval:  164 QRS Duration: 128 QT Interval:  494 QTC Calculation: 468 R Axis:   21 Text Interpretation: Poor data quality, interpretation may be adversely affected Sinus bradycardia with Premature atrial complexes Right bundle branch block Abnormal ECG When compared with ECG of 03/25/2017, Right bundle branch block is now present Confirmed by Delora Fuel ([id number]) on 03/06/2018 7:05:12 AM     Patient was visiting family member in the hospital.  She suddenly became very lightheaded and diaphoretic.  Patient was in seated position.  She had syncopal episode and low blood pressure.  Patient denies she had chest pain.  She did however vomit several times after the episode as well as a couple episodes of diarrheal stool.  Now she is complaining of significant chills.  She is denying chest pain or headache.  Patient is alert without respiratory distress but having chills.  Heart is regular no gross rub or gallop.  Lungs clear.  Abdomen soft.  No lower extremity edema.  Patient is neurologically intact.  During the course of the patient's evaluation, she developed fever to 102.  CT illustrated pneumonia.  I agree with plan and management.       Charlesetta Shanks, MD 03/12/18 [phone]

## 2018-03-06 NOTE — H&P (Signed)
[facility] History and Physical  [name]:[id number] DOB: 12-Dec-1946 DOA: 03/06/2018  Referring physician: *  PCP: Mosie Lukes, MD   Chief Complaint:  Nausea vomiting diarrhea and syncopal episode  HPI:   72 year old [place] female with a history of coronary artery disease, dyslipidemia , hypertension, and multinodular goiter, who was brought in today because of acute onset of 3-4 episodes of diarrhea associated with near syncopal episode, nausea vomiting and diaphoresis that started today while she was visiting the hospital at [facility] . A rapid response was called and the patient was found to be hypotensive and bradycardic. Patient  systolic blood pressure initially was in the 60s. Patient was with her husband who is getting a pacemaker today and has been hospitalized for the last 2-3 days. She has been eating hospital food , her daughter has been eating the same and she is not ill. She suddenly felt diaphoretic and nauseous. Patient experienced burning sensation in her chest after which she vomited. ED course BP (!) 122/58   Pulse 76   Temp (!) 102 F (38.9 C) (Oral)   Resp (!) 21   Ht 5\' 5"  (1.651 m)   Wt 86.2 kg (190 lb)   SpO2 95%   BMI 31.62 kg/m  Patient found to have multifocal pneumonia on CT and started on Rocephin and azithromycin. Resuscitated with IV fluids. She has received a total of 1 L in the ED. Her d-dimer was elevated which led to the CT angiogram. Lactic acid was within normal limits. Blood cultures were obtained. CT scan showed extensive bronchopneumonia in both lung fields. Patient is being admitted for pneumonia with sepsis       Review of Systems: negative for the following  Constitutional: Positive for diaphoresis. Negative for fever.  Respiratory: Negative for shortness of breath.   Cardiovascular: Positive for chest pain. Negative for palpitations and leg swelling.  Gastrointestinal: Positive for diarrhea, nausea and vomiting.  Negative for abdominal pain.  Neurological: Positive for weakness and light-headedness.  All other review of systems was found to be negative     Past Medical History:  Diagnosis Date  . Acute MI, subendocardial ([place])   . Back pain 05/31/2013  . Benign paroxysmal positional vertigo 08/05/2016  . CAD (coronary artery disease)    a. NSTEMI 10/2011: [place] 11/19/11: pLAD 30%, oOM 60%, AVCFX 30%, CFX after OM2 30%, pRCA 60 and 70%, then 99%, AM 80-90% with TIMI 3 flow.  PCI: Promus DES x 2 to RCA; b. 06/2012 Cath: patent RCA stents w/ subtl occl of Acute Marginal (jailed)->Med rx; c. 05/2015 MV: EF 59%, mod mid infsept/inf/ap lat/ap infarct with peri-infarct isch-->Med Rx; d. 02/2016 Cath: LM nl, LAD 26m, RI 50, RCA patent stents.  . Colitis   . Facial skin lesion 01/28/2017  . GERD (gastroesophageal reflux disease)    occasional  . Hyperlipidemia   . Hypertension   . Hypertensive heart disease    a. Echocardiogram 11/19/11: Difficult acoustic windows, EF 60-65%, normal LV wall thickness, grade 1 diastolic dysfunction  . Hypoparathyroidism ([place])   . Low zinc level 11/26/2016  . Multinodular thyroid    Goiter s/p thyroidectomy in 2007 with post-op  hypocalcemia and post-op hypothyroidism/hypoparathyroidism with hypocalcemia  . Neck pain on right side 07/13/2013  . Nocturia 05/31/2013  . Pain in right axilla 03/13/2016  . Palpitations    a. 03/2012 - patient set up for event monitor but did not wear correctly -  she declined wearing a repeat monitor  .  Post-surgical hypothyroidism   . Sun-damaged skin 12/05/2014  . Tubular adenoma of colon 06/2011  . Unspecified constipation 05/31/2013  . Vertigo   . Zinc deficiency 11/26/2015     Past Surgical History:  Procedure Laterality Date  . CARDIAC CATHETERIZATION N/A 03/08/2016   Procedure: Left Heart Cath and Coronary Angiography;  Surgeon: Jettie Booze, MD;  Location: [place] [facility];  Service: Cardiovascular;  Laterality: N/A;  . COLONOSCOPY   Aenomatous polyps   07/05/2011  . COLONOSCOPY N/A 09/28/2014   Procedure: COLONOSCOPY;  Surgeon: Ladene Artist, MD;  Location: [facility] ENDOSCOPY;  Service: Endoscopy;  Laterality: N/A;  . CORONARY ANGIOPLASTY WITH STENT PLACEMENT    . LEFT HEART CATHETERIZATION WITH CORONARY ANGIOGRAM N/A 07/17/2012   Procedure: LEFT HEART CATHETERIZATION WITH CORONARY ANGIOGRAM;  Surgeon: Hillary Bow, MD;  Location: [facility] CATH LAB;  Service: Cardiovascular;  Laterality: N/A;  . PERCUTANEOUS CORONARY STENT INTERVENTION (PCI-S) N/A 11/19/2011   Procedure: PERCUTANEOUS CORONARY STENT INTERVENTION (PCI-S);  Surgeon: Hillary Bow, MD;  Location: [facility] CATH LAB;  Service: Cardiovascular;  Laterality: N/A;  . SHOULDER ARTHROSCOPY W/ ROTATOR CUFF REPAIR     right  . TOTAL THYROIDECTOMY  2007   GOITER      Social History:  reports that  has never smoked. she has never used smokeless tobacco. She reports that she does not drink alcohol or use drugs.    Allergies  Allergen Reactions  . Zetia [Ezetimibe] Other (See Comments)    Myalgia, paresthesias and weakness    Family History  Problem Relation Age of Onset  . Colon cancer Brother   . Cancer Brother        COLON  . Hypertension Father   . Heart disease Father   . Heart attack Father   . Blindness Sister   . Congestive Heart Failure Sister   . Hypertension Sister   . Thyroid disease Sister   . Cancer Brother        multiple myelomas  . Diabetes Neg Hx   . Prostate cancer Neg Hx   . Breast cancer Neg Hx         Prior to Admission medications   Medication Sig Start Date End Date Taking? Authorizing Provider  ALPRAZolam (XANAX) 0.25 MG tablet Take 1 tablet (0.25 mg total) by mouth 2 (two) times daily as needed for anxiety. 02/17/18   Mosie Lukes, MD  amLODipine (NORVASC) 10 MG tablet TAKE 1 TABLET(10 MG) BY MOUTH DAILY 01/02/18   Fay Records, MD  aspirin EC 81 MG tablet Take 81 mg by mouth daily.   11/22/11   Dunn, Dayna N, PA-C  B Complex-C  (B-COMPLEX WITH VITAMIN C) tablet Take 1 tablet by mouth daily.    [provider]  calcitRIOL (ROCALTROL) 0.25 MCG capsule TAKE 3 CAPSULES(0.75 MCG) BY MOUTH DAILY 02/17/18   Mosie Lukes, MD  calcium carbonate (TUMS - DOSED IN MG ELEMENTAL CALCIUM) 500 MG chewable tablet Chew 4 tablets by mouth 2 (two) times daily.     [provider]  carvedilol (COREG) 3.125 MG tablet Take 1 tablet (3.125 mg total) by mouth 2 (two) times daily. 01/02/18   Fay Records, MD  cholecalciferol (VITAMIN D) 1000 UNITS tablet Take 1,000 Units by mouth daily.    [provider]  clopidogrel (PLAVIX) 75 MG tablet Take 1 tablet (75 mg total) by mouth daily. 01/02/18   Fay Records, MD  Evolocumab (REPATHA SURECLICK) 093 MG/ML SOAJ Inject  140 mg into the skin every 14 (fourteen) days. 12/10/17   Fay Records, MD  famotidine (PEPCID) 20 MG tablet Take 20 mg by mouth 2 (two) times daily as needed for heartburn or indigestion.    [provider]  gabapentin (NEURONTIN) 300 MG capsule Take 1 capsule by mouth 3 (three) times daily as needed. 12/25/16   [provider]  isosorbide mononitrate (IMDUR) 60 MG 24 hr tablet TAKE 1 AND 1/2 TABLETS(90 MG) BY MOUTH DAILY 01/02/18   Fay Records, MD  levothyroxine (SYNTHROID, LEVOTHROID) 100 MCG tablet TAKE 1 TABLET(100 MCG) BY MOUTH DAILY 02/17/18   Mosie Lukes, MD  Menthol, Topical Analgesic, (ICY HOT EX) Apply 1 patch topically daily.    [provider]  methocarbamol (ROBAXIN) 500 MG tablet Take 2 tablets (1,000 mg total) by mouth every 8 (eight) hours as needed (shoulder pain). 03/15/16   Julianne Rice, MD  Multiple Vitamin (MULTIVITAMIN WITH MINERALS) TABS tablet Take 1 tablet by mouth daily.    [provider]  nitroGLYCERIN (NITROSTAT) 0.4 MG SL tablet DISSOLVE 1 TABLET UNDER THE TONGUE EVERY 5 MINUTES AS NEEDED FOR CHEST PAIN AS DIRECTED 03/28/17   Mosie Lukes, MD  oxyCODONE-acetaminophen (PERCOCET) 10-325 MG  tablet Take 1 tablet by mouth 4 (four) times daily as needed. 12/25/16   [provider]  pantoprazole (PROTONIX) 40 MG tablet Take 1 tablet (40 mg total) by mouth daily. 11/26/16   Mosie Lukes, MD  rosuvastatin (CRESTOR) 40 MG tablet Take 1 tablet (40 mg total) by mouth daily. 01/02/18   Fay Records, MD     Physical Exam: Vitals:   03/06/18 1200 03/06/18 1210 03/06/18 1215 03/06/18 1430  BP: (!) 114/51  134/65 (!) 122/58  Pulse: 86  87 76  Resp: (!) 27  17 (!) 21  Temp:  (!) 102 F (38.9 C)    TempSrc:  Oral    SpO2: 94%  95% 95%  Weight:      Height:            Vitals:   03/06/18 1200 03/06/18 1210 03/06/18 1215 03/06/18 1430  BP: (!) 114/51  134/65 (!) 122/58  Pulse: 86  87 76  Resp: (!) 27  17 (!) 21  Temp:  (!) 102 F (38.9 C)    TempSrc:  Oral    SpO2: 94%  95% 95%  Weight:      Height:       Constitutional:  Well-developed but appears tired Eyes: PERRL, lids and conjunctivae normal ENMT: Mucous membranes are moist. Posterior pharynx clear of any exudate or lesions.Normal dentition.  Neck: normal, supple, no masses, no thyromegaly Respiratory: clear to auscultation bilaterally, no wheezing, no crackles. Normal respiratory effort. No accessory muscle use.  Cardiovascular: Regular rate and rhythm, no murmurs / rubs / gallops. No extremity edema. 2+ pedal pulses. No carotid bruits.  Abdomen: no tenderness, no masses palpated. No hepatosplenomegaly. Bowel sounds positive.  Musculoskeletal: no clubbing / cyanosis. No joint deformity upper and lower extremities. Good ROM, no contractures. Normal muscle tone.  Skin: no rashes, lesions, ulcers. No induration Neurologic: CN 2-12 grossly intact. Sensation intact, DTR normal. Strength 5/5 in all 4.  Psychiatric: Normal judgment and insight. Alert and oriented x 3. Normal mood.     Labs on Admission: I have personally reviewed following labs and imaging studies  CBC: Recent Labs  Lab 03/06/18 0710  WBC 16.9*   NEUTROABS 13.2*  HGB 14.0  HCT 42.1  MCV 86.4  PLT 865    Basic Metabolic Panel: Recent Labs  Lab 03/06/18 0710  NA 139  K 4.4  CL 104  CO2 22  GLUCOSE 154*  BUN 27*  CREATININE 1.26*  CALCIUM 8.6*    GFR: Estimated Creatinine Clearance (by C-G formula based on SCr of 1.26 mg/dL (H)) Female: 44.4 mL/min (A) Female: 54.3 mL/min (A)  Liver Function Tests: Recent Labs  Lab 03/06/18 0710  AST 29  ALT 26  ALKPHOS 81  BILITOT 0.7  PROT 7.5  ALBUMIN 3.8   No results for input(s): LIPASE, AMYLASE in the last 168 hours. No results for input(s): AMMONIA in the last 168 hours.  Coagulation Profile: No results for input(s): INR, PROTIME in the last 168 hours. Recent Labs    03/06/18 1023  DDIMER 2.29*    Cardiac Enzymes: No results for input(s): CKTOTAL, CKMB, CKMBINDEX, TROPONINI in the last 168 hours.  BNP (last 3 results) No results for input(s): PROBNP in the last 8760 hours.  HbA1C: No results for input(s): HGBA1C in the last 72 hours. Lab Results  Component Value Date   HGBA1C 5.9 05/07/2016     CBG: Recent Labs  Lab 03/06/18 0701  GLUCAP 141*    Lipid Profile: No results for input(s): CHOL, HDL, LDLCALC, TRIG, CHOLHDL, LDLDIRECT in the last 72 hours.  Thyroid Function Tests: No results for input(s): TSH, T4TOTAL, FREET4, T3FREE, THYROIDAB in the last 72 hours.  Anemia Panel: No results for input(s): VITAMINB12, FOLATE, FERRITIN, TIBC, IRON, RETICCTPCT in the last 72 hours.  Urine analysis:    Component Value Date/Time   COLORURINE YELLOW 05/24/2014 2109   APPEARANCEUR TURBID (A) 05/24/2014 2109   LABSPEC 1.014 05/24/2014 2109   PHURINE 7.5 05/24/2014 2109   GLUCOSEU NEGATIVE 05/24/2014 2109   HGBUR NEGATIVE 05/24/2014 2109   BILIRUBINUR NEGATIVE 05/24/2014 2109   BILIRUBINUR negative 03/29/2014 1420   KETONESUR NEGATIVE 05/24/2014 2109   PROTEINUR NEGATIVE 05/24/2014 2109   UROBILINOGEN 0.2 05/24/2014 2109   NITRITE NEGATIVE  05/24/2014 2109   LEUKOCYTESUR TRACE (A) 05/24/2014 2109    Sepsis Labs: @LABRCNTIP (procalcitonin:4,lacticidven:4) )No results found for this or any previous visit (from the past 240 hour(s)).       Radiological Exams on Admission: Ct Angio Chest Pe W And/or Wo Contrast  Result Date: 03/06/2018 CLINICAL DATA:  Syncopal episode.  Chest pain.  Diaphoresis. EXAM: CT ANGIOGRAPHY CHEST WITH CONTRAST TECHNIQUE: Multidetector CT imaging of the chest was performed using the standard protocol during bolus administration of intravenous contrast. Multiplanar CT image reconstructions and MIPs were obtained to evaluate the vascular anatomy. CONTRAST:  80 cc Isovue 370 COMPARISON:  Chest radiography same day FINDINGS: Cardiovascular: Pulmonary arterial opacification is good. No pulmonary emboli. Heart size is normal. No pericardial fluid. Aortic atherosclerosis and coronary artery calcification. Mediastinum/Nodes: No mass or lymphadenopathy. Lungs/Pleura: The right lung is clear except for mild patchy infiltrate in the right middle lobe. On the left, there is much more extensive infiltrate in the left upper lobe consistent with bronchopneumonia. Mild patchy involvement of the left lower lobe. No lobar collapse. No effusion Upper Abdomen: Normal Musculoskeletal: Negative Review of the MIP images confirms the above findings. IMPRESSION: No pulmonary emboli. Extensive bronchopneumonia in the left upper lobe. Patchy bronchopneumonia in the right middle lobe and right lower lobe. Aortic Atherosclerosis (ICD10-I70.0). Coronary artery calcification. Electronically Signed   By: Nelson Chimes M.D.   On: 03/06/2018 12:58   Dg Chest Portable 1 View  Result Date: 03/06/2018  CLINICAL DATA:  Syncope. Nausea vomiting. Chest pain. Diaphoresis. EXAM: PORTABLE CHEST 1 VIEW COMPARISON:  03/25/2017. FINDINGS: Mediastinum and hilar structures normal. Heart size normal. Low lung volumes. No focal infiltrate. No pleural effusion or  pneumothorax. Surgical clips noted over the lower neck. No acute bony abnormality. IMPRESSION: Low lung volumes with mild basilar atelectasis. Electronically Signed   By: Marcello Moores  Register   On: 03/06/2018 07:44   Ct Angio Chest Pe W And/or Wo Contrast  Result Date: 03/06/2018 CLINICAL DATA:  Syncopal episode.  Chest pain.  Diaphoresis. EXAM: CT ANGIOGRAPHY CHEST WITH CONTRAST TECHNIQUE: Multidetector CT imaging of the chest was performed using the standard protocol during bolus administration of intravenous contrast. Multiplanar CT image reconstructions and MIPs were obtained to evaluate the vascular anatomy. CONTRAST:  80 cc Isovue 370 COMPARISON:  Chest radiography same day FINDINGS: Cardiovascular: Pulmonary arterial opacification is good. No pulmonary emboli. Heart size is normal. No pericardial fluid. Aortic atherosclerosis and coronary artery calcification. Mediastinum/Nodes: No mass or lymphadenopathy. Lungs/Pleura: The right lung is clear except for mild patchy infiltrate in the right middle lobe. On the left, there is much more extensive infiltrate in the left upper lobe consistent with bronchopneumonia. Mild patchy involvement of the left lower lobe. No lobar collapse. No effusion Upper Abdomen: Normal Musculoskeletal: Negative Review of the MIP images confirms the above findings. IMPRESSION: No pulmonary emboli. Extensive bronchopneumonia in the left upper lobe. Patchy bronchopneumonia in the right middle lobe and right lower lobe. Aortic Atherosclerosis (ICD10-I70.0). Coronary artery calcification. Electronically Signed   By: Nelson Chimes M.D.   On: 03/06/2018 12:58   Dg Chest Portable 1 View  Result Date: 03/06/2018 CLINICAL DATA:  Syncope. Nausea vomiting. Chest pain. Diaphoresis. EXAM: PORTABLE CHEST 1 VIEW COMPARISON:  03/25/2017. FINDINGS: Mediastinum and hilar structures normal. Heart size normal. Low lung volumes. No focal infiltrate. No pleural effusion or pneumothorax. Surgical clips  noted over the lower neck. No acute bony abnormality. IMPRESSION: Low lung volumes with mild basilar atelectasis. Electronically Signed   By: Marcello Moores  Register   On: 03/06/2018 07:44      EKG: Independently reviewed. Poor data quality, interpretation may be adversely affected Sinus bradycardia with Premature atrial complexes Right bundle branch block Abnormal ECG When compared with ECG of 03/25/2017, Right bundle branch block is now present    Assessment/Plan Principal Problem:   CAP (community acquired pneumonia) Active Problems:   Hypothyroidism   Hyperlipidemia   Essential hypertension, benign   Depression   Anemia   Benign paroxysmal positional vertigo   Anxiety     Multifocal community acquired pneumonia with sepsis Pneumonia order set has been initiated Check respiratory panel Follow culture results Patient has been started on Rocephin/azithromycin Patient started on aggressive   IV fluids resucitation  Hypertension-hold antihypertensive medications given low blood pressure with the exception of Coreg with hold parameters  Dyslipidemia-patient is on repatha, crestor   Hypothyroidism-continue with  Synthroid, check TSH  Diarrhea she could have concomitant viral gastroenteritis, check GI panel that the patient's diarrhea recurs    DVT prophylaxis: Heparin     Code Status Orders full code          consults called:  Family Communication: Admission, patients condition and plan of care including tests being ordered have been discussed with the patient  who indicates understanding and agree with the plan and Code Status  Admission status: inpatient    Disposition plan: Further plan will depend as patient's clinical course evolves and further radiologic and laboratory  data become available. Likely home when stable   At the time of admission, it appears that the appropriate admission status for this patient is INPATIENT .Thisis judged to be reasonable and necessary  in order to provide the required intensity of service to ensure the patient's safetygiven thepresenting symptoms, physical exam findings, and initial radiographic and laboratory data in the context of their chronic comorbidities.   Reyne Dumas MD [facility] Pager [phone]  If 7PM-7AM, please contact night-coverage [email or web address] Password [facility]  03/06/2018, 2:43 PM

## 2018-03-07 DIAGNOSIS — D500 Iron deficiency anemia secondary to blood loss (chronic): Secondary | ICD-10-CM

## 2018-03-07 DIAGNOSIS — J181 Lobar pneumonia, unspecified organism: Secondary | ICD-10-CM

## 2018-03-07 DIAGNOSIS — E7849 Other hyperlipidemia: Secondary | ICD-10-CM

## 2018-03-07 DIAGNOSIS — I10 Essential (primary) hypertension: Secondary | ICD-10-CM

## 2018-03-07 DIAGNOSIS — F320 Major depressive disorder, single episode, mild: Secondary | ICD-10-CM

## 2018-03-07 DIAGNOSIS — F419 Anxiety disorder, unspecified: Secondary | ICD-10-CM

## 2018-03-07 LAB — COMPREHENSIVE METABOLIC PANEL
ALK PHOS: 61 U/L (ref 38–126)
ALT: 20 U/L (ref 14–54)
AST: 22 U/L (ref 15–41)
Albumin: 3 g/dL — ABNORMAL LOW (ref 3.5–5.0)
Anion gap: 10 (ref 5–15)
BUN: 20 mg/dL (ref 6–20)
CALCIUM: 7.2 mg/dL — AB (ref 8.9–10.3)
CO2: 23 mmol/L (ref 22–32)
CREATININE: 1.06 mg/dL — AB (ref 0.44–1.00)
Chloride: 107 mmol/L (ref 101–111)
GFR calc non Af Amer: 52 mL/min — ABNORMAL LOW (ref >60)
GFR, EST AFRICAN AMERICAN: 60 mL/min — AB (ref >60)
Glucose, Bld: 95 mg/dL (ref 65–99)
Potassium: 3.6 mmol/L (ref 3.5–5.1)
SODIUM: 140 mmol/L (ref 135–145)
Total Bilirubin: 0.8 mg/dL (ref 0.3–1.2)
Total Protein: 6 g/dL — ABNORMAL LOW (ref 6.5–8.1)

## 2018-03-07 LAB — URINALYSIS, ROUTINE W REFLEX MICROSCOPIC
Bilirubin Urine: NEGATIVE
GLUCOSE, UA: NEGATIVE mg/dL
HGB URINE DIPSTICK: NEGATIVE
Ketones, ur: NEGATIVE mg/dL
NITRITE: NEGATIVE
PH: 5 (ref 5.0–8.0)
Protein, ur: NEGATIVE mg/dL
SPECIFIC GRAVITY, URINE: 1.028 (ref 1.005–1.030)

## 2018-03-07 LAB — CBC
HEMATOCRIT: 36.1 % (ref 36.0–46.0)
HEMOGLOBIN: 11.6 g/dL — AB (ref 12.0–15.0)
MCH: 27.8 pg (ref 26.0–34.0)
MCHC: 32.1 g/dL (ref 30.0–36.0)
MCV: 86.6 fL (ref 78.0–100.0)
Platelets: 178 10*3/uL (ref 150–400)
RBC: 4.17 MIL/uL (ref 3.87–5.11)
RDW: 15.3 % (ref 11.5–15.5)
WBC: 9.5 10*3/uL (ref 4.0–10.5)

## 2018-03-07 LAB — T4, FREE: Free T4: 1.29 ng/dL — ABNORMAL HIGH (ref 0.61–1.12)

## 2018-03-07 LAB — STREP PNEUMONIAE URINARY ANTIGEN: Strep Pneumo Urinary Antigen: NEGATIVE

## 2018-03-07 LAB — HIV ANTIBODY (ROUTINE TESTING W REFLEX): HIV Screen 4th Generation wRfx: NONREACTIVE

## 2018-03-07 MED ORDER — ISOSORBIDE MONONITRATE ER 60 MG PO TB24
90.0000 mg | ORAL_TABLET | Freq: Every day | ORAL | Status: DC
  Administered 2018-03-07: 90 mg via ORAL
  Filled 2018-03-07: qty 1

## 2018-03-07 MED ORDER — B COMPLEX-C PO TABS
1.0000 | ORAL_TABLET | Freq: Every day | ORAL | Status: DC
  Administered 2018-03-07 – 2018-03-08 (×2): 1 via ORAL
  Filled 2018-03-07 (×2): qty 1

## 2018-03-07 MED ORDER — CALCIUM CARBONATE ANTACID 500 MG PO CHEW
4.0000 | CHEWABLE_TABLET | Freq: Two times a day (BID) | ORAL | Status: DC
  Administered 2018-03-07 (×2): 800 mg via ORAL
  Filled 2018-03-07 (×3): qty 4

## 2018-03-07 MED ORDER — ADULT MULTIVITAMIN W/MINERALS CH
1.0000 | ORAL_TABLET | Freq: Every day | ORAL | Status: DC
  Administered 2018-03-07 – 2018-03-08 (×2): 1 via ORAL
  Filled 2018-03-07 (×2): qty 1

## 2018-03-07 MED ORDER — LEVOTHYROXINE SODIUM 88 MCG PO TABS
88.0000 ug | ORAL_TABLET | Freq: Every day | ORAL | Status: DC
  Administered 2018-03-08: 88 ug via ORAL
  Filled 2018-03-07: qty 1

## 2018-03-07 MED ORDER — METHOCARBAMOL 500 MG PO TABS: 1000.0000 mg | ORAL_TABLET | Freq: Three times a day (TID) | ORAL | Status: DC | PRN

## 2018-03-07 MED ORDER — VITAMIN D 1000 UNITS PO TABS
1000.0000 [IU] | ORAL_TABLET | Freq: Every day | ORAL | Status: DC
  Administered 2018-03-07 – 2018-03-08 (×2): 1000 [IU] via ORAL
  Filled 2018-03-07 (×2): qty 1

## 2018-03-07 MED ORDER — ALPRAZOLAM 0.25 MG PO TABS
0.2500 mg | ORAL_TABLET | Freq: Two times a day (BID) | ORAL | Status: DC | PRN
  Filled 2018-03-07: qty 1

## 2018-03-07 MED ORDER — SODIUM CHLORIDE 0.9 % IV SOLN
INTRAVENOUS | Status: DC
  Administered 2018-03-07 – 2018-03-08 (×2): via INTRAVENOUS

## 2018-03-07 MED ORDER — GABAPENTIN 300 MG PO CAPS: 300.0000 mg | ORAL_CAPSULE | Freq: Three times a day (TID) | ORAL | Status: DC | PRN

## 2018-03-07 MED ORDER — EVOLOCUMAB 140 MG/ML ~~LOC~~ SOAJ: 140.0000 mg | SUBCUTANEOUS | Status: DC

## 2018-03-07 MED ORDER — BISOPROLOL FUMARATE 5 MG PO TABS: 2.5000 mg | ORAL_TABLET | Freq: Every day | ORAL | Status: DC

## 2018-03-07 MED ORDER — CALCITRIOL 0.5 MCG PO CAPS
0.7500 ug | ORAL_CAPSULE | Freq: Every day | ORAL | Status: DC
  Administered 2018-03-07 – 2018-03-08 (×2): 0.75 ug via ORAL
  Filled 2018-03-07 (×3): qty 1

## 2018-03-07 NOTE — Progress Notes (Signed)
[facility]                                                                              Patient Demographics  [name], is a 72 y.o. adult, DOB - Nov 30, 1946, WCB:[id number]  Admit date - 03/06/2018   Admitting Physician Reyne Dumas, MD  Outpatient Primary MD for the patient is Mosie Lukes, MD  Outpatient specialists:   LOS - 1  days   Medical records reviewed and are as summarized below:    Chief Complaint  Patient presents with  . Chest Pain       Brief summary   Patient is a 72 year old female with a history of CAD, dyslipidemia, hypertension, multinodular goiter presented with acute onset of 3-4 episodes of diarrhea near syncopal episode, nausea, vomiting and diaphoresis while she was visiting [facility].  Rapid response was called and patient was found to be hypotensive and bradycardic, systolic blood pressure in 60s.  Patient was found to have multifocal pneumonia on CT.  He received IV fluids in the ED.   Assessment & Plan    Principal Problem:   CAP (community acquired pneumonia)/multifocal pneumonia -Patient met sepsis criteria at the time of admission with a temp of 102 F, bradycardia with pulse of 50, BP 67/46 -Influenza panel negative, d-dimer was elevated 2.29  - CTA chest was obtained which showed no pulmonary emboli, extensive bronchopneumonia in the left upper lobe, patchy bronchopneumonia in the right middle lobe and right lower lobe -Continue IV Zithromax, IV Rocephin  Active Problems:    Hypothyroidism -TSH suppressed to 0.159 with an elevated T4 1.29 -Decrease Synthroid to 88 MCG daily     Hyperlipidemia -Continue Crestor    Essential hypertension, benign, history of CAD -Currently no chest pain or shortness of breath, continue Crestor, Plavix, Coreg.  BP was in 60s at the time of admission, improved with IV fluids. -Reviewed office notes by Dr. Harrington Challenger, discontinued bisoprolol.  Will hold imdur and norvasc until  BP stabilizes.   Continue only Coreg.    Depression, anxiety -Continue Xanax as needed  GERD -Continue PPI  Code Status: full  DVT Prophylaxis:  Lovenox  Family Communication: Discussed in detail with the patient, all imaging results, lab results explained to the patient and daughter in detail at the bedside.  Subsequently patient's son came into the room after 19mns and was extremely upset and verbally hostile towards me and RN about patient's care yesterday, delay in the room and orders in the ER.  Explained to the patient's son that I assumed care and attending physician from today morning.  Also explained to the son the patient is receiving every 24 hours IV antibiotics, she cannot have continuous IV antibiotics.  Patient's daughter and patient apologized for their son's behavior and requested me to continue as the attending physician.  Disposition Plan: Possible 24 to 48 hours  Time Spent in minutes   35 minutes  Procedures:  CTA chest  Consultants:   None  Antimicrobials:   IV Zithromax 3/14  IV Rocephin 3/14   Medications  Scheduled Meds: . aspirin EC  81 mg Oral Daily  .  B-complex with vitamin C  1 tablet Oral Daily  . calcitRIOL  0.75 mcg Oral Daily  . calcium carbonate  4 tablet Oral BID  . carvedilol  3.125 mg Oral BID  . cholecalciferol  1,000 Units Oral Daily  . clopidogrel  75 mg Oral Daily  . heparin  5,000 Units Subcutaneous Q8H  . isosorbide mononitrate  90 mg Oral Daily  . levothyroxine  100 mcg Oral QAC breakfast  . multivitamin with minerals  1 tablet Oral Daily  . pantoprazole  40 mg Oral Daily  . rosuvastatin  40 mg Oral Daily   Continuous Infusions: . sodium chloride 75 mL/hr at 03/07/18 1049  . azithromycin 500 mg (03/07/18 1225)  . cefTRIAXone (ROCEPHIN)  IV Stopped (03/07/18 1215)   PRN Meds:.acetaminophen **OR** acetaminophen, ALPRAZolam, gabapentin, levalbuterol, methocarbamol, ondansetron **OR** ondansetron (ZOFRAN) IV   Antibiotics     Anti-infectives (From admission, onward)   Start     Dose/Rate Route Frequency Ordered Stop   03/07/18 1200  cefTRIAXone (ROCEPHIN) 1 g in sodium chloride 0.9 % 100 mL IVPB     1 g 200 mL/hr over 30 Minutes Intravenous Every 24 hours 03/06/18 1459     03/07/18 1200  azithromycin (ZITHROMAX) 500 mg in sodium chloride 0.9 % 250 mL IVPB     500 mg 250 mL/hr over 60 Minutes Intravenous Every 24 hours 03/06/18 1459     03/06/18 1315  cefTRIAXone (ROCEPHIN) 1 g in sodium chloride 0.9 % 100 mL IVPB     1 g 200 mL/hr over 30 Minutes Intravenous  Once 03/06/18 1307 03/06/18 1508   03/06/18 1315  azithromycin (ZITHROMAX) 500 mg in sodium chloride 0.9 % 250 mL IVPB     500 mg 250 mL/hr over 60 Minutes Intravenous  Once 03/06/18 1307 03/06/18 1810        Subjective:   [name] was seen and examined today.  Patient feels a lot better today, no dizziness, low-grade temp of 99.9 F. Patient denies dizziness, chest pain, shortness of breath, abdominal pain, N/V/D/C. No acute events overnight.    Objective:   Vitals:   03/06/18 1939 03/06/18 2311 03/07/18 0509 03/07/18 1035  BP: (!) 119/50  (!) 119/43 (!) 127/52  Pulse: 68  (!) 59 71  Resp: '18  18 18  '$ Temp:  99.9 F (37.7 C) 98.3 F (36.8 C) 97.7 F (36.5 C)  TempSrc:  Axillary Oral Oral  SpO2: 96%  99% 92%  Weight:      Height:        Intake/Output Summary (Last 24 hours) at 03/07/2018 1332 Last data filed at 03/07/2018 0200 Gross per 24 hour  Intake 240 ml  Output 350 ml  Net -110 ml     Wt Readings from Last 3 Encounters:  03/06/18 86.2 kg (190 lb)  01/02/18 89.7 kg (197 lb 12.8 oz)  11/04/17 91.1 kg (200 lb 12.8 oz)     Exam  General: Alert and oriented x 3, NAD  Eyes:   HEENT:   Cardiovascular: S1 S2 auscultated, no rubs, murmurs or gallops. Regular rate and rhythm.  Respiratory: Decreased breath sounds at the bases, rhonchi in the left lung  Gastrointestinal: Soft, nontender, nondistended, + bowel  sounds  Ext: no pedal edema bilaterally  Neuro: no new deficits  Musculoskeletal: No digital cyanosis, clubbing  Skin: No rashes  Psych: Normal affect and demeanor, alert and oriented x3    Data Reviewed:  I have personally reviewed following labs and imaging studies  Micro Results Recent Results (from the past 240 hour(s))  Culture, blood (routine x 2)     Status: None (Preliminary result)   Collection Time: 03/06/18  1:08 PM  Result Value Ref Range Status   Specimen Description BLOOD RIGHT ANTECUBITAL  Final   Special Requests   Final    IN BOTH AEROBIC AND ANAEROBIC BOTTLES Blood Culture adequate volume   Culture   Final    NO GROWTH < 24 HOURS Performed at [facility], [address] [place], [place] [address]    Report Status PENDING  Incomplete  Culture, blood (routine x 2)     Status: None (Preliminary result)   Collection Time: 03/06/18  1:13 PM  Result Value Ref Range Status   Specimen Description BLOOD LEFT HAND  Final   Special Requests   Final    IN BOTH AEROBIC AND ANAEROBIC BOTTLES Blood Culture adequate volume   Culture   Final    NO GROWTH < 24 HOURS Performed at [facility], [place] [address], [place], [place] [address]    Report Status PENDING  Incomplete    Radiology Reports Ct Angio Chest Pe W And/or Wo Contrast  Result Date: 03/06/2018 CLINICAL DATA:  Syncopal episode.  Chest pain.  Diaphoresis. EXAM: CT ANGIOGRAPHY CHEST WITH CONTRAST TECHNIQUE: Multidetector CT imaging of the chest was performed using the standard protocol during bolus administration of intravenous contrast. Multiplanar CT image reconstructions and MIPs were obtained to evaluate the vascular anatomy. CONTRAST:  80 cc Isovue 370 COMPARISON:  Chest radiography same day FINDINGS: Cardiovascular: Pulmonary arterial opacification is good. No pulmonary emboli. Heart size is normal. No pericardial fluid. Aortic atherosclerosis and coronary artery calcification.  Mediastinum/Nodes: No mass or lymphadenopathy. Lungs/Pleura: The right lung is clear except for mild patchy infiltrate in the right middle lobe. On the left, there is much more extensive infiltrate in the left upper lobe consistent with bronchopneumonia. Mild patchy involvement of the left lower lobe. No lobar collapse. No effusion Upper Abdomen: Normal Musculoskeletal: Negative Review of the MIP images confirms the above findings. IMPRESSION: No pulmonary emboli. Extensive bronchopneumonia in the left upper lobe. Patchy bronchopneumonia in the right middle lobe and right lower lobe. Aortic Atherosclerosis (ICD10-I70.0). Coronary artery calcification. Electronically Signed   By: Nelson Chimes M.D.   On: 03/06/2018 12:58   Dg Chest Portable 1 View  Result Date: 03/06/2018 CLINICAL DATA:  Syncope. Nausea vomiting. Chest pain. Diaphoresis. EXAM: PORTABLE CHEST 1 VIEW COMPARISON:  03/25/2017. FINDINGS: Mediastinum and hilar structures normal. Heart size normal. Low lung volumes. No focal infiltrate. No pleural effusion or pneumothorax. Surgical clips noted over the lower neck. No acute bony abnormality. IMPRESSION: Low lung volumes with mild basilar atelectasis. Electronically Signed   By: Marcello Moores  Register   On: 03/06/2018 07:44    Lab Data:  CBC: Recent Labs  Lab 03/06/18 0710 03/07/18 0859  WBC 16.9* 9.5  NEUTROABS 13.2*  --   HGB 14.0 11.6*  HCT 42.1 36.1  MCV 86.4 86.6  PLT 236 786   Basic Metabolic Panel: Recent Labs  Lab 03/06/18 0710 03/06/18 2020 03/07/18 0859  NA 139  --  140  K 4.4  --  3.6  CL 104  --  107  CO2 22  --  23  GLUCOSE 154*  --  95  BUN 27*  --  20  CREATININE 1.26*  --  1.06*  CALCIUM 8.6*  --  7.2*  MG  --  1.9  --  GFR: Estimated Creatinine Clearance (by C-G formula based on SCr of 1.06 mg/dL (H)) Female: 52.8 mL/min (A) Female: 64.6 mL/min (A) Liver Function Tests: Recent Labs  Lab 03/06/18 0710 03/07/18 0859  AST 29 22  ALT 26 20  ALKPHOS 81 61    BILITOT 0.7 0.8  PROT 7.5 6.0*  ALBUMIN 3.8 3.0*   No results for input(s): LIPASE, AMYLASE in the last 168 hours. No results for input(s): AMMONIA in the last 168 hours. Coagulation Profile: No results for input(s): INR, PROTIME in the last 168 hours. Cardiac Enzymes: No results for input(s): CKTOTAL, CKMB, CKMBINDEX, TROPONINI in the last 168 hours. BNP (last 3 results) No results for input(s): PROBNP in the last 8760 hours. HbA1C: No results for input(s): HGBA1C in the last 72 hours. CBG: Recent Labs  Lab 03/06/18 0701  GLUCAP 141*   Lipid Profile: No results for input(s): CHOL, HDL, LDLCALC, TRIG, CHOLHDL, LDLDIRECT in the last 72 hours. Thyroid Function Tests: Recent Labs    03/06/18 2020 03/07/18 0859  TSH 0.159*  --   FREET4  --  1.29*   Anemia Panel: No results for input(s): VITAMINB12, FOLATE, FERRITIN, TIBC, IRON, RETICCTPCT in the last 72 hours. Urine analysis:    Component Value Date/Time   COLORURINE YELLOW 03/06/2018 1421   APPEARANCEUR HAZY (A) 03/06/2018 1421   LABSPEC 1.028 03/06/2018 1421   PHURINE 5.0 03/06/2018 1421   GLUCOSEU NEGATIVE 03/06/2018 1421   HGBUR NEGATIVE 03/06/2018 1421   BILIRUBINUR NEGATIVE 03/06/2018 1421   BILIRUBINUR negative 03/29/2014 1420   KETONESUR NEGATIVE 03/06/2018 1421   PROTEINUR NEGATIVE 03/06/2018 1421   UROBILINOGEN 0.2 05/24/2014 2109   NITRITE NEGATIVE 03/06/2018 1421   LEUKOCYTESUR MODERATE (A) 03/06/2018 1421     Leasa Kincannon M.D. [facility] 03/07/2018, 1:32 PM  Pager: [phone] Between 7am to 7pm - call Pager - [phone]  After 7pm go to [email or web address] - password [email or web address]  Call night coverage person covering after 7pm

## 2018-03-07 NOTE — Progress Notes (Signed)
PT Cancellation Note  Patient Details Name: [name] MRN: [id number] DOB: [birth year]   Cancelled Treatment:    Reason Eval/Treat Not Completed: Patient declined, no reason specified After getting history and home information, pt reports she did not want to get up with PT and reports she was too tired. Pt waving PT out of the room. Will follow up as schedule allows.   Leighton Ruff, PT, DPT  Acute Rehabilitation Services  Pager: [phone]  Rudean Hitt 03/07/2018, 3:00 PM

## 2018-03-07 NOTE — Plan of Care (Signed)
  Progressing Education: Knowledge of General Education information will improve 03/07/2018 1214 - Progressing by Harlin Heys, RN Health Behavior/Discharge Planning: Ability to manage health-related needs will improve 03/07/2018 1214 - Progressing by Harlin Heys, RN Clinical Measurements: Ability to maintain clinical measurements within normal limits will improve 03/07/2018 1214 - Progressing by Harlin Heys, RN Will remain free from infection 03/07/2018 1214 - Progressing by Harlin Heys, RN Diagnostic test results will improve 03/07/2018 1214 - Progressing by Harlin Heys, RN Respiratory complications will improve 03/07/2018 1214 - Progressing by Harlin Heys, RN Cardiovascular complication will be avoided 03/07/2018 1214 - Progressing by Harlin Heys, RN Activity: Risk for activity intolerance will decrease 03/07/2018 1214 - Progressing by Harlin Heys, RN Nutrition: Adequate nutrition will be maintained 03/07/2018 1214 - Progressing by Harlin Heys, RN Coping: Level of anxiety will decrease 03/07/2018 1214 - Progressing by Harlin Heys, RN Elimination: Will not experience complications related to bowel motility 03/07/2018 1214 - Progressing by Harlin Heys, RN Will not experience complications related to urinary retention 03/07/2018 1214 - Progressing by Harlin Heys, RN Pain Managment: General experience of comfort will improve 03/07/2018 1214 - Progressing by Harlin Heys, RN Safety: Ability to remain free from injury will improve 03/07/2018 1214 - Progressing by Harlin Heys, RN Skin Integrity: Risk for impaired skin integrity will decrease 03/07/2018 1214 - Progressing by Harlin Heys, RN

## 2018-03-07 NOTE — Consult Note (Signed)
[facility] CM Primary Care Navigator  03/07/2018  [name] 1946-03-30 [id number]  Met withpatient at the bedsideto identify possible discharge needs. Patientreportshaving "dizziness- near syncope, nausea, vomiting, diarrhea and fever" which all ledto this admission.  Patientendorses Dr.Stacey Charlett Rice with [employer] [employer] as herprimary care provider.   [facility].   Patientreportsmanagingherownmedications at home straight out of the containers with no problem.  Patientstatesthatshe wasdriving prior to admission but can use taxi for transportation to herdoctors'appointments if needed after discharge. Daughter or son may provide transportation when needed.  Patientlives withhusbandat home. Currently, husband is also a patient at the hospital- post surgery. Patient reports that her daughter from [place] will stay with them until the end of the month to assist with recovery of both. Her son [name]- lives in [place] can also assist with their needs.   Anticipated discharge plan is homewhen improved per patient.   Patient voiced understanding to call primary care provider's officewhenshereturnshome,for a post discharge follow-up within1- 2 weeksor sooner if needs arise.Patient letter (with PCP's contact number) was provided astheirreminder.  Discussed with patientregarding [facility] CM services available for health managementat homebut she indicated having no current needs or concerns at this time.  Shevoiced understanding of needto seekreferral from primary care provider to [facility] care management if deemednecessaryand appropriate for anyservices in thefuture.  [facility] care management information was provided for future needs thatshemay have.  Patient however, verbally agreed and opted for EMMI Pneumonia calls to  follow-upwithher recovery at home. Referral made for EMMI Pneumonia callsafter discharge.   For additional questions please contact:  Edwena Felty A. Natavia Sublette, BSN, RN-BC [facility] PRIMARY CARE Navigator Cell: [phone]

## 2018-03-07 NOTE — Progress Notes (Signed)
I spoke with patient about home Repatha - explained she can get off schedule and take it when she goes home, or she could have familiy bring in 1 syringe and we can fill out home medication paperwork and re-enter order to administer here.   She was agreeable to waiting until she goes home to administer. I told her she can let the Clinic know she got off schedule at her follow up appointment.  I did clarify above plan with M.Supple, PharmD who follows patient at [facility].  Tatyanna Cronk D. Cola Highfill, PharmD, BCPS Clinical Pharmacist Clinical Phone for 03/07/2018 until 3:30pm: [phone] If after 3:30pm, please call main pharmacy at [phone] 03/07/2018 11:35 AM

## 2018-03-08 ENCOUNTER — Ambulatory Visit (HOSPITAL_BASED_OUTPATIENT_CLINIC_OR_DEPARTMENT_OTHER): Payer: Medicare Other

## 2018-03-08 DIAGNOSIS — H8110 Benign paroxysmal vertigo, unspecified ear: Secondary | ICD-10-CM

## 2018-03-08 LAB — BASIC METABOLIC PANEL
Anion gap: 11 (ref 5–15)
BUN: 15 mg/dL (ref 6–20)
CHLORIDE: 110 mmol/L (ref 101–111)
CO2: 20 mmol/L — ABNORMAL LOW (ref 22–32)
Calcium: 6.6 mg/dL — ABNORMAL LOW (ref 8.9–10.3)
Creatinine, Ser: 0.96 mg/dL (ref 0.44–1.00)
GFR calc Af Amer: >60 mL/min (ref >60)
GFR, EST NON AFRICAN AMERICAN: 58 mL/min — AB (ref >60)
Glucose, Bld: 90 mg/dL (ref 65–99)
Potassium: 3.1 mmol/L — ABNORMAL LOW (ref 3.5–5.1)
SODIUM: 141 mmol/L (ref 135–145)

## 2018-03-08 LAB — C DIFFICILE QUICK SCREEN W PCR REFLEX
C DIFFICILE (CDIFF) INTERP: NOT DETECTED
C Diff antigen: NEGATIVE
C Diff toxin: NEGATIVE

## 2018-03-08 LAB — CBC
HCT: 33 % — ABNORMAL LOW (ref 36.0–46.0)
Hemoglobin: 10.6 g/dL — ABNORMAL LOW (ref 12.0–15.0)
MCH: 27.3 pg (ref 26.0–34.0)
MCHC: 32.1 g/dL (ref 30.0–36.0)
MCV: 85.1 fL (ref 78.0–100.0)
PLATELETS: 149 10*3/uL — AB (ref 150–400)
RBC: 3.88 MIL/uL (ref 3.87–5.11)
RDW: 14.9 % (ref 11.5–15.5)
WBC: 9 10*3/uL (ref 4.0–10.5)

## 2018-03-08 LAB — URINE CULTURE: Culture: <10000 — AB

## 2018-03-08 LAB — LEGIONELLA PNEUMOPHILA SEROGP 1 UR AG: L. PNEUMOPHILA SEROGP 1 UR AG: NEGATIVE

## 2018-03-08 LAB — T3: T3, Total: 85 ng/dL (ref 71–180)

## 2018-03-08 MED ORDER — CARVEDILOL 3.125 MG PO TABS: 3.1250 mg | ORAL_TABLET | Freq: Two times a day (BID) | ORAL | 3 refills | Status: DC

## 2018-03-08 MED ORDER — AZITHROMYCIN 500 MG PO TABS: 500.0000 mg | ORAL_TABLET | Freq: Every day | ORAL | 0 refills | Status: DC

## 2018-03-08 MED ORDER — LEVOTHYROXINE SODIUM 88 MCG PO TABS: 88.0000 ug | ORAL_TABLET | Freq: Every day | ORAL | 3 refills | Status: DC

## 2018-03-08 MED ORDER — ISOSORBIDE MONONITRATE ER 60 MG PO TB24: 90.0000 mg | ORAL_TABLET | Freq: Every day | ORAL | Status: DC

## 2018-03-08 MED ORDER — POTASSIUM CHLORIDE CRYS ER 20 MEQ PO TBCR
40.0000 meq | EXTENDED_RELEASE_TABLET | Freq: Once | ORAL | Status: AC
  Administered 2018-03-08: 40 meq via ORAL
  Filled 2018-03-08: qty 2

## 2018-03-08 MED ORDER — CEFPODOXIME PROXETIL 200 MG PO TABS: 200.0000 mg | ORAL_TABLET | Freq: Two times a day (BID) | ORAL | 0 refills | Status: DC

## 2018-03-08 MED ORDER — AMLODIPINE BESYLATE 10 MG PO TABS: 10.0000 mg | ORAL_TABLET | Freq: Every day | ORAL | 3 refills | Status: DC

## 2018-03-08 NOTE — Progress Notes (Signed)
Patient discharged home per MD. Reviewed discharge instructions with patient. Prescriptions provided. Offered wheelchair, however, patient declined. Bartholomew Crews, RN

## 2018-03-08 NOTE — Evaluation (Signed)
Physical Therapy Evaluation Patient Details Name: [name] MRN: [id number] DOB: 1946/06/03 Today's Date: 03/08/2018   History of Present Illness  Patient presented to the hospital with nausea, vomiting, and diarhea. She reports feeling better today. She is willing to work with therapy. PMH:    Clinical Impression  Patient appears to be at her baseline mobility. She reported no fatigue or syncope with gait. She was independent with all transfers. No need for further skilled PT.     Follow Up Recommendations No PT follow up    Equipment Recommendations       Recommendations for Other Services       Precautions / Restrictions        Mobility  Bed Mobility      Independent              Transfers Overall transfer level: Independent                  Ambulation/Gait Ambulation/Gait assistance: Independent Ambulation Distance (Feet): 130 Feet Assistive device: None       General Gait Details: No syncope or dyspnesa with gait   Stairs            Wheelchair Mobility    Modified Rankin (Stroke Patients Only)       Balance Overall balance assessment: Independent                                           Pertinent Vitals/Pain      Home Living Family/patient expects to be discharged to:: Private residence Living Arrangements: Spouse/significant other Available Help at Discharge: Family;Available 24 hours/day Type of Home: House Home Access: Level entry     Home Layout: One level Home Equipment: None      Prior Function Level of Independence: Independent               Hand Dominance   Dominant Hand: Right    Extremity/Trunk Assessment   Upper Extremity Assessment Upper Extremity Assessment: Overall WFL for tasks assessed    Lower Extremity Assessment Lower Extremity Assessment: Overall WFL for tasks assessed       Communication   Communication: No difficulties  Cognition Arousal/Alertness:  Awake/alert Behavior During Therapy: WFL for tasks assessed/performed Overall Cognitive Status: Within Functional Limits for tasks assessed                                        General Comments      Exercises     Assessment/Plan    PT Assessment Patent does not need any further PT services  PT Problem List         PT Treatment Interventions      PT Goals (Current goals can be found in the Care Plan section)  Acute Rehab PT Goals Patient Stated Goal: to go home  PT Goal Formulation: With patient Time For Goal Achievement: 03/15/18 Potential to Achieve Goals: Good    Frequency     Barriers to discharge        Co-evaluation               AM-PAC PT "6 Clicks" Daily Activity  Outcome Measure Difficulty turning over in bed (including adjusting bedclothes, sheets and blankets)?: None Difficulty moving from lying on back to  sitting on the side of the bed? : None Difficulty sitting down on and standing up from a chair with arms (e.g., wheelchair, bedside commode, etc,.)?: None Help needed moving to and from a bed to chair (including a wheelchair)?: None Help needed walking in hospital room?: None Help needed climbing 3-5 steps with a railing? : None 6 Click Score: 24    End of Session Equipment Utilized During Treatment: Gait belt Activity Tolerance: Patient tolerated treatment well Patient left: in bed(patient declined to get ot the chair ) Nurse Communication: Mobility status PT Visit Diagnosis: Unsteadiness on feet (R26.81)    Time: 0109-3235 PT Time Calculation (min) (ACUTE ONLY): 14 min   Charges:   PT Evaluation $PT Eval Low Complexity: 1 Low     PT G Codes:         Carney Living PT DPT  03/08/2018, 12:21 PM

## 2018-03-08 NOTE — Discharge Summary (Signed)
Physician Discharge Summary   Patient ID: [name] MRN: [id number] DOB/AGE: 28-Dec-1945 72 y.o.  Admit date: 03/06/2018 Discharge date: 03/08/2018  Primary Care Physician:  Mosie Lukes, MD   Recommendations for Outpatient Follow-up:  1. Follow up with PCP in 1-2 weeks 2. Please obtain BMP/CBC in one week 3. Patient was recommended to hold off on amlodipine and imdur, given instructions when to restart 4. Synthroid dose decreased to 88 MCG daily, please recheck TSH, f T4 in 4 weeks   Home Health: None Equipment/Devices: None  Discharge Condition: stable  CODE STATUS: FULL Diet recommendation: Heart healthy diet   Discharge Diagnoses:     Sepsis with community-acquired pneumonia Diarrhea likely due to viral gastroenteritis Hypotension . Hypothyroidism . Hyperlipidemia . Essential hypertension, benign . Depression . Anemia . Benign paroxysmal positional vertigo . Anxiety   Consults: none     Allergies:   Allergies  Allergen Reactions  . Zetia [Ezetimibe] Other (See Comments)    Myalgia, paresthesias and weakness     DISCHARGE MEDICATIONS: Allergies as of 03/08/2018      Reactions   Zetia [ezetimibe] Other (See Comments)   Myalgia, paresthesias and weakness      Medication List    STOP taking these medications   bisoprolol 5 MG tablet Commonly known as:  ZEBETA   oxyCODONE-acetaminophen 10-325 MG tablet Commonly known as:  PERCOCET     TAKE these medications   ALPRAZolam 0.25 MG tablet Commonly known as:  XANAX Take 1 tablet (0.25 mg total) by mouth 2 (two) times daily as needed for anxiety.   amLODipine 10 MG tablet Commonly known as:  NORVASC Take 1 tablet (10 mg total) by mouth daily. HOLD until BP stable, see instructions What changed:    how much to take  how to take this  when to take this  additional instructions   aspirin EC 81 MG tablet Take 81 mg by mouth daily.   azithromycin 500 MG tablet Commonly known as:   ZITHROMAX Take 1 tablet (500 mg total) by mouth daily for 7 days.   B-complex with vitamin C tablet Take 1 tablet by mouth daily.   calcitRIOL 0.25 MCG capsule Commonly known as:  ROCALTROL TAKE 3 CAPSULES(0.75 MCG) BY MOUTH DAILY   calcium carbonate 500 MG chewable tablet Commonly known as:  TUMS - dosed in mg elemental calcium Chew 4 tablets by mouth 2 (two) times daily.   carvedilol 3.125 MG tablet Commonly known as:  COREG Take 1 tablet (3.125 mg total) by mouth 2 (two) times daily.   cefpodoxime 200 MG tablet Commonly known as:  VANTIN Take 1 tablet (200 mg total) by mouth 2 (two) times daily. X 7 days   cholecalciferol 1000 units tablet Commonly known as:  VITAMIN D Take 1,000 Units by mouth daily.   clopidogrel 75 MG tablet Commonly known as:  PLAVIX Take 1 tablet (75 mg total) by mouth daily.   Evolocumab 140 MG/ML Soaj Commonly known as:  REPATHA SURECLICK Inject 546 mg into the skin every 14 (fourteen) days.   famotidine 20 MG tablet Commonly known as:  PEPCID Take 20 mg by mouth 2 (two) times daily as needed for heartburn or indigestion.   gabapentin 300 MG capsule Commonly known as:  NEURONTIN Take 1 capsule by mouth 3 (three) times daily as needed (nerve pain).   ICY HOT EX Apply 1 patch topically daily.   isosorbide mononitrate 60 MG 24 hr tablet Commonly known as:  IMDUR Take  1.5 tablets (90 mg total) by mouth daily. HOLD until BP stable, see instructions What changed:    how much to take  how to take this  when to take this  additional instructions   levothyroxine 88 MCG tablet Commonly known as:  SYNTHROID, LEVOTHROID Take 1 tablet (88 mcg total) by mouth daily before breakfast. Start taking on:  03/09/2018 What changed:    medication strength  how much to take  how to take this  when to take this  additional instructions   methocarbamol 500 MG tablet Commonly known as:  ROBAXIN Take 2 tablets (1,000 mg total) by mouth every  8 (eight) hours as needed (shoulder pain).   multivitamin with minerals Tabs tablet Take 1 tablet by mouth daily.   nitroGLYCERIN 0.4 MG SL tablet Commonly known as:  NITROSTAT DISSOLVE 1 TABLET UNDER THE TONGUE EVERY 5 MINUTES AS NEEDED FOR CHEST PAIN AS DIRECTED   pantoprazole 40 MG tablet Commonly known as:  PROTONIX Take 1 tablet (40 mg total) by mouth daily.   rosuvastatin 40 MG tablet Commonly known as:  CRESTOR Take 1 tablet (40 mg total) by mouth daily.        Brief H and P: For complete details please refer to admission H and P, but in brief  Patient is a 72 year old female with a history of CAD, dyslipidemia, hypertension, multinodular goiter presented with acute onset of 3-4 episodes of diarrhea near syncopal episode, nausea, vomiting and diaphoresis while she was visiting [facility].  Rapid response was called and patient was found to be hypotensive and bradycardic, systolic blood pressure in 60s.  Patient was found to have multifocal pneumonia on CT.  He received IV fluids in the ED.    Hospital Course:  CAP (community acquired pneumonia)/multifocal pneumonia -Patient met sepsis criteria at the time of admission with a temp of 102 F, bradycardia with pulse of 50, BP 67/46 -Influenza panel negative, d-dimer was elevated 2.29  - CTA chest was obtained which showed no pulmonary emboli, extensive bronchopneumonia in the left upper lobe, patchy bronchopneumonia in the right middle lobe and right lower lobe -Patient was placed on IV Zithromax, IV Rocephin, transition to oral Zithromax and Vantin for 7 days  - Now back to baseline, PT evaluation done, no PT follow-up needed     Hypothyroidism -TSH suppressed to 0.159 with an elevated T4 1.29 -Decreased Synthroid to 88 MCG daily     Hyperlipidemia -Continue Crestor    Essential hypertension, benign, history of CAD -Currently no chest pain or shortness of breath, continue Crestor, Plavix, Coreg.  BP was in  60s at the time of admission, improved with IV fluids. -Reviewed office notes by Dr. Harrington Challenger, discontinued bisoprolol.  -Patient recommended to hold imdur and norvasc until BP stabilizes.   Continue only Coreg.    Depression, anxiety -Continue Xanax as needed  GERD -Continue PPI   Diarrhea -Likely viral gastroenteritis, C. difficile was negative  Day of Discharge S: Feels a lot better, eager to go home  BP (!) 127/50 (BP Location: Left Arm)   Pulse 60   Temp 98.2 F (36.8 C) (Oral)   Resp 18   Ht '5\' 5"'$  (1.651 m)   Wt 86.5 kg (190 lb 11.2 oz)   SpO2 95%   BMI 31.73 kg/m   Physical Exam: General: Alert and awake oriented x3 not in any acute distress. HEENT: anicteric sclera, pupils reactive to light and accommodation CVS: S1-S2 clear no murmur rubs or gallops  Chest: clear to auscultation bilaterally, no wheezing rales or rhonchi Abdomen: soft nontender, nondistended, normal bowel sounds Extremities: no cyanosis, clubbing or edema noted bilaterally Neuro: Cranial nerves II-XII intact, no focal neurological deficits   The results of significant diagnostics from this hospitalization (including imaging, microbiology, ancillary and laboratory) are listed below for reference.      Procedures/Studies:  Ct Angio Chest Pe W And/or Wo Contrast  Result Date: 03/06/2018 CLINICAL DATA:  Syncopal episode.  Chest pain.  Diaphoresis. EXAM: CT ANGIOGRAPHY CHEST WITH CONTRAST TECHNIQUE: Multidetector CT imaging of the chest was performed using the standard protocol during bolus administration of intravenous contrast. Multiplanar CT image reconstructions and MIPs were obtained to evaluate the vascular anatomy. CONTRAST:  80 cc Isovue 370 COMPARISON:  Chest radiography same day FINDINGS: Cardiovascular: Pulmonary arterial opacification is good. No pulmonary emboli. Heart size is normal. No pericardial fluid. Aortic atherosclerosis and coronary artery calcification. Mediastinum/Nodes: No mass  or lymphadenopathy. Lungs/Pleura: The right lung is clear except for mild patchy infiltrate in the right middle lobe. On the left, there is much more extensive infiltrate in the left upper lobe consistent with bronchopneumonia. Mild patchy involvement of the left lower lobe. No lobar collapse. No effusion Upper Abdomen: Normal Musculoskeletal: Negative Review of the MIP images confirms the above findings. IMPRESSION: No pulmonary emboli. Extensive bronchopneumonia in the left upper lobe. Patchy bronchopneumonia in the right middle lobe and right lower lobe. Aortic Atherosclerosis (ICD10-I70.0). Coronary artery calcification. Electronically Signed   By: Nelson Chimes M.D.   On: 03/06/2018 12:58   Dg Chest Portable 1 View  Result Date: 03/06/2018 CLINICAL DATA:  Syncope. Nausea vomiting. Chest pain. Diaphoresis. EXAM: PORTABLE CHEST 1 VIEW COMPARISON:  03/25/2017. FINDINGS: Mediastinum and hilar structures normal. Heart size normal. Low lung volumes. No focal infiltrate. No pleural effusion or pneumothorax. Surgical clips noted over the lower neck. No acute bony abnormality. IMPRESSION: Low lung volumes with mild basilar atelectasis. Electronically Signed   By: Marcello Moores  Register   On: 03/06/2018 07:44       LAB RESULTS: Basic Metabolic Panel: Recent Labs  Lab 03/06/18 2020 03/07/18 0859 03/08/18 0736  NA  --  140 141  K  --  3.6 3.1*  CL  --  107 110  CO2  --  23 20*  GLUCOSE  --  95 90  BUN  --  20 15  CREATININE  --  1.06* 0.96  CALCIUM  --  7.2* 6.6*  MG 1.9  --   --    Liver Function Tests: Recent Labs  Lab 03/06/18 0710 03/07/18 0859  AST 29 22  ALT 26 20  ALKPHOS 81 61  BILITOT 0.7 0.8  PROT 7.5 6.0*  ALBUMIN 3.8 3.0*   No results for input(s): LIPASE, AMYLASE in the last 168 hours. No results for input(s): AMMONIA in the last 168 hours. CBC: Recent Labs  Lab 03/06/18 0710 03/07/18 0859 03/08/18 0736  WBC 16.9* 9.5 9.0  NEUTROABS 13.2*  --   --   HGB 14.0 11.6*  10.6*  HCT 42.1 36.1 33.0*  MCV 86.4 86.6 85.1  PLT 236 178 149*   Cardiac Enzymes: No results for input(s): CKTOTAL, CKMB, CKMBINDEX, TROPONINI in the last 168 hours. BNP: Invalid input(s): POCBNP CBG: Recent Labs  Lab 03/06/18 0701  GLUCAP 141*      Disposition and Follow-up: Discharge Instructions    Diet - low sodium heart healthy   Complete by:  As directed    Discharge instructions  Complete by:  As directed    Continue Coreg but HOLD  amlodipine and Imdur.  Check BP daily, keep a log, if consistently running in 140's, you may restart amlodipine, then follow your BP.  If it is still running high, you may restart Imdur at that point.  Follow-up with your primary care physician or cardiologist in 2 weeks.  Stay hydrated.   Increase activity slowly   Complete by:  As directed        DISPOSITION: [place]    Mosie Lukes, MD. Schedule an appointment as soon as possible for a visit in 2 week(s).   Specialty:  Family Medicine Contact information: [address] [place] [address] [phone]            Time coordinating discharge:  50mns   Signed:   REstill CottaM.D. [facility] 03/08/2018, 1:22 PM Pager: [phone]

## 2018-03-09 ENCOUNTER — Other Ambulatory Visit: Payer: Self-pay | Admitting: Family Medicine

## 2018-03-09 DIAGNOSIS — R1013 Epigastric pain: Secondary | ICD-10-CM

## 2018-03-09 DIAGNOSIS — E039 Hypothyroidism, unspecified: Secondary | ICD-10-CM

## 2018-03-09 DIAGNOSIS — E876 Hypokalemia: Secondary | ICD-10-CM

## 2018-03-09 DIAGNOSIS — E60 Dietary zinc deficiency: Secondary | ICD-10-CM

## 2018-03-09 DIAGNOSIS — I10 Essential (primary) hypertension: Secondary | ICD-10-CM

## 2018-03-09 DIAGNOSIS — Z0000 Encounter for general adult medical examination without abnormal findings: Secondary | ICD-10-CM

## 2018-03-09 DIAGNOSIS — E892 Postprocedural hypoparathyroidism: Secondary | ICD-10-CM

## 2018-03-10 ENCOUNTER — Telehealth: Payer: Self-pay | Admitting: Family Medicine

## 2018-03-10 DIAGNOSIS — K2100 Gastro-esophageal reflux disease with esophagitis, without bleeding: Secondary | ICD-10-CM

## 2018-03-10 LAB — MISC LABCORP TEST (SEND OUT): Labcorp test code: 183480

## 2018-03-10 NOTE — Telephone Encounter (Signed)
Copied from [place] [id number]. Topic: Quick Communication - Rx Refill/Question >> Mar 10, 2018 12:36 PM Clack, Laban Emperor wrote: Medication: pantoprazole (PROTONIX) 40 MG tablet [[id number]]    Has the patient contacted their pharmacy? Yes.     (Agent: If no, request that the patient contact the pharmacy for the refill.)   Preferred Pharmacy (with phone number or street name): [facility] - [place], [address] AT [facility] [address] [phone] (Phone) [phone] (Fax)     Agent: Please be advised that RX refills may take up to 3 business days. We ask that you follow-up with your pharmacy.

## 2018-03-10 NOTE — Telephone Encounter (Signed)
Pantoprazole(Protonix) 40mg  refill. Last prescription refill was on 12/4/ 2017 and pt was given a year supply of medication.    LOV: 02/17/18  Dr. Charlett Blake

## 2018-03-11 ENCOUNTER — Telehealth: Payer: Self-pay | Admitting: *Deleted

## 2018-03-11 LAB — CULTURE, BLOOD (ROUTINE X 2)
CULTURE: NO GROWTH
Culture: NO GROWTH
SPECIAL REQUESTS: ADEQUATE
Special Requests: ADEQUATE

## 2018-03-11 MED ORDER — PANTOPRAZOLE SODIUM 40 MG PO TBEC: 40.0000 mg | DELAYED_RELEASE_TABLET | Freq: Every day | ORAL | 1 refills | Status: DC

## 2018-03-11 NOTE — Telephone Encounter (Signed)
Pt called back to the missed phone call, pt was told to stay by the phone and wait for another call, contact pt to advise

## 2018-03-11 NOTE — Telephone Encounter (Signed)
Refill has been sent. Notified pt. Pt says she needs results from recent hospital stay. Reviewed discharge summary and pt was advised to f/u with PCP in 1-2 weeks. No availability with PCP in that window. Pt agreeable to see NP, Inda Castle and appt has been scheduled for 03/14/18 at 10:40am.

## 2018-03-11 NOTE — Telephone Encounter (Signed)
No answer

## 2018-03-11 NOTE — Telephone Encounter (Signed)
Noted  

## 2018-03-12 ENCOUNTER — Encounter: Payer: Self-pay | Admitting: *Deleted

## 2018-03-12 ENCOUNTER — Other Ambulatory Visit: Payer: Self-pay | Admitting: *Deleted

## 2018-03-12 NOTE — Telephone Encounter (Signed)
No answer

## 2018-03-12 NOTE — Patient Outreach (Signed)
[facility]) Care Management  03/12/2018  [name] [birth year] [id number]  EMMI-Pneumonia [place] referral-day#1, 03/11/2018; Reason: Scheduled follow up appointment? "no"  Telephone call attempt x 1; left HIPPA compliant voice mail requesting return call.  Plan: Passenger transport manager. Follow up 3-5 days.  Sherrin Daisy, RN BSN [place] Management Coordinator [facility] Care Management  [phone]

## 2018-03-12 NOTE — Telephone Encounter (Signed)
Patient ID: WANDY BOSSLER MRN: [id number] DOB/AGE: 1946-12-16 72 y.o.  Admit date: 03/06/2018 Discharge date: 03/08/2018  Primary Care Physician:  Mosie Lukes, MD   Recommendations for Outpatient Follow-up:  1. Follow up with PCP in 1-2 weeks 2. Please obtain BMP/CBC in one week 3. Patient was recommended to hold off on amlodipine and imdur, given instructions when to restart 4. Synthroid dose decreased to 88 MCG daily, please recheck TSH, f T4 in 4 weeks   Home Health: None Equipment/Devices: None  Discharge Condition: stable  CODE STATUS: FULL Diet recommendation: Heart healthy diet  Transition Care Management Follow-up Telephone Call   How have you been since you were released from the hospital? Doing better    Do you understand why you were in the hospital? yes   Do you understand the discharge instructions? yes   Where were you discharged to? Home with daughter and husband    Items Reviewed:  Medications reviewed: no, pt wants to discuss with Provider at time of appt  Allergies reviewed: yes  Dietary changes reviewed: yes  Referrals reviewed: yes   Functional Questionnaire:   Activities of Daily Living (ADLs):   She states they are independent in the following: ambulation, bathing and hygiene, feeding, continence, grooming, toileting and dressing States they require assistance with the following: na   Any transportation issues/concerns?: no   Any patient concerns? yes, would like to discuss medication changes   Confirmed importance and date/time of follow-up visits scheduled yes  Provider Appointment booked with Debbrah Alar NP 03/14/18 @1040   Confirmed with patient if condition begins to worsen call PCP or go to the ER.  Patient was given the office number and encouraged to call back with question or concerns.  : yes

## 2018-03-13 ENCOUNTER — Ambulatory Visit: Payer: 59 | Admitting: Podiatry

## 2018-03-13 ENCOUNTER — Inpatient Hospital Stay: Payer: Medicare Other | Admitting: Family Medicine

## 2018-03-13 NOTE — Telephone Encounter (Signed)
Noted  

## 2018-03-14 ENCOUNTER — Ambulatory Visit (HOSPITAL_BASED_OUTPATIENT_CLINIC_OR_DEPARTMENT_OTHER)
Admission: RE | Admit: 2018-03-14 | Discharge: 2018-03-14 | Disposition: A | Discharge status: Home / Self Care | Payer: Medicare Other | Source: Ambulatory Visit | Attending: Family | Admitting: Family

## 2018-03-14 ENCOUNTER — Encounter: Payer: Self-pay | Admitting: Family

## 2018-03-14 ENCOUNTER — Telehealth: Payer: Self-pay | Admitting: Family

## 2018-03-14 ENCOUNTER — Ambulatory Visit (INDEPENDENT_AMBULATORY_CARE_PROVIDER_SITE_OTHER): Payer: Medicare Other | Admitting: Family

## 2018-03-14 ENCOUNTER — Other Ambulatory Visit: Payer: Self-pay | Admitting: *Deleted

## 2018-03-14 VITALS — BP 130/52 | HR 64 | Temp 98.1°F | Resp 16 | Ht 64.0 in | Wt 193.6 lb

## 2018-03-14 DIAGNOSIS — J189 Pneumonia, unspecified organism: Secondary | ICD-10-CM

## 2018-03-14 DIAGNOSIS — A0811 Acute gastroenteropathy due to Norwalk agent: Secondary | ICD-10-CM | POA: Diagnosis not present

## 2018-03-14 DIAGNOSIS — E039 Hypothyroidism, unspecified: Secondary | ICD-10-CM | POA: Diagnosis not present

## 2018-03-14 DIAGNOSIS — J9811 Atelectasis: Secondary | ICD-10-CM | POA: Diagnosis not present

## 2018-03-14 DIAGNOSIS — I10 Essential (primary) hypertension: Secondary | ICD-10-CM | POA: Diagnosis not present

## 2018-03-14 DIAGNOSIS — I700 Atherosclerosis of aorta: Secondary | ICD-10-CM | POA: Insufficient documentation

## 2018-03-14 LAB — COMPREHENSIVE METABOLIC PANEL
ALBUMIN: 4 g/dL (ref 3.5–5.2)
ALT: 37 U/L — ABNORMAL HIGH (ref 0–35)
AST: 27 U/L (ref 0–37)
Alkaline Phosphatase: 83 U/L (ref 39–117)
BUN: 20 mg/dL (ref 6–23)
CALCIUM: 8.5 mg/dL (ref 8.4–10.5)
CHLORIDE: 103 meq/L (ref 96–112)
CO2: 28 mEq/L (ref 19–32)
Creatinine, Ser: 0.99 mg/dL (ref 0.40–1.20)
GFR: 58.76 mL/min — AB (ref >60.00)
Glucose, Bld: 91 mg/dL (ref 70–99)
POTASSIUM: 4.3 meq/L (ref 3.5–5.1)
Sodium: 142 mEq/L (ref 135–145)
Total Bilirubin: 0.3 mg/dL (ref 0.2–1.2)
Total Protein: 7.8 g/dL (ref 6.0–8.3)

## 2018-03-14 LAB — CBC WITH DIFFERENTIAL/PLATELET
BASOS PCT: 0.8 % (ref 0.0–3.0)
Basophils Absolute: 0.1 10*3/uL (ref 0.0–0.1)
EOS PCT: 5.1 % — AB (ref 0.0–5.0)
Eosinophils Absolute: 0.5 10*3/uL (ref 0.0–0.7)
HEMATOCRIT: 39.2 % (ref 36.0–46.0)
HEMOGLOBIN: 13 g/dL (ref 12.0–15.0)
LYMPHS PCT: 26.2 % (ref 12.0–46.0)
Lymphs Abs: 2.4 10*3/uL (ref 0.7–4.0)
MCHC: 33.3 g/dL (ref 30.0–36.0)
MCV: 84.2 fl (ref 78.0–100.0)
Monocytes Absolute: 0.8 10*3/uL (ref 0.1–1.0)
Monocytes Relative: 9.2 % (ref 3.0–12.0)
Neutro Abs: 5.4 10*3/uL (ref 1.4–7.7)
Neutrophils Relative %: 58.7 % (ref 43.0–77.0)
Platelets: 324 10*3/uL (ref 150.0–400.0)
RBC: 4.65 Mil/uL (ref 3.87–5.11)
RDW: 14.8 % (ref 11.5–15.5)
WBC: 9.1 10*3/uL (ref 4.0–10.5)

## 2018-03-14 MED ORDER — ISOSORBIDE MONONITRATE ER 60 MG PO TB24: 60.0000 mg | ORAL_TABLET | Freq: Every day | ORAL | Status: DC

## 2018-03-14 NOTE — Telephone Encounter (Signed)
We were previously told [facility] would manage diabetes, hypertension and hyperlipidemia. Placed referral for recent pneumonia and history of hypertension case management.

## 2018-03-14 NOTE — Telephone Encounter (Signed)
She is requesting general case management please.

## 2018-03-14 NOTE — Telephone Encounter (Signed)
Is this for the pneumonia or another reason?

## 2018-03-14 NOTE — Progress Notes (Addendum)
Subjective:    Patient ID: [name], adult    DOB: 10-06-1946, 72 y.o.   MRN: [id number]  HPI  Ms. Pichardo is a 72 yr old female with hx of CAD who presents today for hospital follow up.  She apparently was at the hospital visiting her husband when she had a near syncopal event. She was brought to the ED where she had associated emesis and diarrhea. She also had some atypical chest pain.  She was found to be hypotensive. Her discharge summary is reviewed.  The patient was admitted with community-acquired pneumonia/sepsis.  She was treated with IV azithromycin and Rocephin.  She was then transitioned to oral Zithromax and Vantin for 7 days.  She clinically improved. She did have some diarrhea which was felt likely secondary to viral gastroenteritis.  She had a C. difficile performed during her hospitalization which was negative. Stool testing during her admission was positive for norovirus.   Since returning home she reports doing well. She is no longer experiencing diarrhea. Reports mild cough but it is improving.  Her energy is slowly returning.   Hypothyroid- her Synthroid dose was decreased to 88 mcg during her hospitalization.   HTN- her imdur was held at discharge as well as her amlodipine.  BP Readings from Last 3 Encounters:  03/14/18 (!) 130/52  03/08/18 (!) 127/50  01/02/18 130/68     Review of Systems See HPI  Past Medical History:  Diagnosis Date  . Acute MI, subendocardial ([place])   . Back pain 05/31/2013  . Benign paroxysmal positional vertigo 08/05/2016  . CAD (coronary artery disease)    a. NSTEMI 10/2011: [place] 11/19/11: pLAD 30%, oOM 60%, AVCFX 30%, CFX after OM2 30%, pRCA 60 and 70%, then 99%, AM 80-90% with TIMI 3 flow.  PCI: Promus DES x 2 to RCA; b. 06/2012 Cath: patent RCA stents w/ subtl occl of Acute Marginal (jailed)->Med rx; c. 05/2015 MV: EF 59%, mod mid infsept/inf/ap lat/ap infarct with peri-infarct isch-->Med Rx; d. 02/2016 Cath: LM nl, LAD 59m, RI 50, RCA  patent stents.  . Colitis   . Facial skin lesion 01/28/2017  . GERD (gastroesophageal reflux disease)    occasional  . Hyperlipidemia   . Hypertension   . Hypertensive heart disease    a. Echocardiogram 11/19/11: Difficult acoustic windows, EF 60-65%, normal LV wall thickness, grade 1 diastolic dysfunction  . Hypoparathyroidism ([place])   . Low zinc level 11/26/2016  . Multinodular thyroid    Goiter s/p thyroidectomy in 2007 with post-op  hypocalcemia and post-op hypothyroidism/hypoparathyroidism with hypocalcemia  . Neck pain on right side 07/13/2013  . Nocturia 05/31/2013  . Pain in right axilla 03/13/2016  . Palpitations    a. 03/2012 - patient set up for event monitor but did not wear correctly -  she declined wearing a repeat monitor  . Post-surgical hypothyroidism   . Sun-damaged skin 12/05/2014  . Tubular adenoma of colon 06/2011  . Unspecified constipation 05/31/2013  . Vertigo   . Zinc deficiency 11/26/2015     Social History   Socioeconomic History  . Marital status: Married    Spouse name: Not on file  . Number of children: 4  . Years of education: 52  . Highest education level: Not on file  Occupational History  . Occupation: Press photographer person at [employer]  . Financial resource strain: Not on file  . Food insecurity:    Worry: Not on file    Inability: Not on file  .  Transportation needs:    Medical: Not on file    Non-medical: Not on file  Tobacco Use  . Smoking status: Never Smoker  . Smokeless tobacco: Never Used  Substance and Sexual Activity  . Alcohol use: No  . Drug use: No  . Sexual activity: Not on file  Lifestyle  . Physical activity:    Days per week: Not on file    Minutes per session: Not on file  . Stress: Not on file  Relationships  . Social connections:    Talks on phone: Not on file    Gets together: Not on file    Attends religious service: Not on file    Active member of club or organization: Not on file    Attends meetings of clubs or  organizations: Not on file    Relationship status: Not on file  . Intimate partner violence:    Fear of current or ex partner: Not on file    Emotionally abused: Not on file    Physically abused: Not on file    Forced sexual activity: Not on file  Other Topics Concern  . Not on file  Social History Narrative   Lives at home alone.  Her son lives near her.   Right-handed.   1 cup coffee per day.    Past Surgical History:  Procedure Laterality Date  . CARDIAC CATHETERIZATION N/A 03/08/2016   Procedure: Left Heart Cath and Coronary Angiography;  Surgeon: Jettie Booze, MD;  Location: [place] CV LAB;  Service: Cardiovascular;  Laterality: N/A;  . COLONOSCOPY  Aenomatous polyps   07/05/2011  . COLONOSCOPY N/A 09/28/2014   Procedure: COLONOSCOPY;  Surgeon: Ladene Artist, MD;  Location: [facility] ENDOS[facility];  Service: Endoscopy;  Laterality: N/A;  . CORONARY ANGIOPLASTY WITH STENT PLACEMENT    . LEFT HEART CATHETERIZATION WITH CORONARY ANGIOGRAM N/A 07/17/2012   Procedure: LEFT HEART CATHETERIZATION WITH CORONARY ANGIOGRAM;  Surgeon: Hillary Bow, MD;  Location: [facility] CATH LAB;  Service: Cardiovascular;  Laterality: N/A;  . PERCUTANEOUS CORONARY STENT INTERVENTION (PCI-S) N/A 11/19/2011   Procedure: PERCUTANEOUS CORONARY STENT INTERVENTION (PCI-S);  Surgeon: Hillary Bow, MD;  Location: [facility] CATH LAB;  Service: Cardiovascular;  Laterality: N/A;  . SHOULDER ARTHROSCOPY W/ ROTATOR CUFF REPAIR     right  . TOTAL THYROIDECTOMY  2007   GOITER    Family History  Problem Relation Age of Onset  . Colon cancer Brother   . Cancer Brother        COLON  . Hypertension Father   . Heart disease Father   . Heart attack Father   . Blindness Sister   . Congestive Heart Failure Sister   . Hypertension Sister   . Thyroid disease Sister   . Cancer Brother        multiple myelomas  . Diabetes Neg Hx   . Prostate cancer Neg Hx   . Breast cancer Neg Hx     Allergies  Allergen Reactions    . Zetia [Ezetimibe] Other (See Comments)    Myalgia, paresthesias and weakness    Current Outpatient Medications on File Prior to Visit  Medication Sig Dispense Refill  . ALPRAZolam (XANAX) 0.25 MG tablet Take 1 tablet (0.25 mg total) by mouth 2 (two) times daily as needed for anxiety. 5 tablet 0  . amLODipine (NORVASC) 10 MG tablet Take 1 tablet (10 mg total) by mouth daily. HOLD until BP stable, see instructions 90 tablet 3  . amLODipine (NORVASC) 10  MG tablet TAKE 1 TABLET(10 MG) BY MOUTH DAILY 90 tablet 0  . aspirin EC 81 MG tablet Take 81 mg by mouth daily.      Marland Kitchen azithromycin (ZITHROMAX) 500 MG tablet Take 1 tablet (500 mg total) by mouth daily for 7 days. 7 tablet 0  . B Complex-C (B-COMPLEX WITH VITAMIN C) tablet Take 1 tablet by mouth daily.    . calcitRIOL (ROCALTROL) 0.25 MCG capsule TAKE 3 CAPSULES(0.75 MCG) BY MOUTH DAILY 270 capsule 0  . calcium carbonate (TUMS - DOSED IN MG ELEMENTAL CALCIUM) 500 MG chewable tablet Chew 4 tablets by mouth 2 (two) times daily.     . carvedilol (COREG) 3.125 MG tablet Take 1 tablet (3.125 mg total) by mouth 2 (two) times daily. 60 tablet 3  . cefpodoxime (VANTIN) 200 MG tablet Take 1 tablet (200 mg total) by mouth 2 (two) times daily. X 7 days 14 tablet 0  . cholecalciferol (VITAMIN D) 1000 UNITS tablet Take 1,000 Units by mouth daily.    . clopidogrel (PLAVIX) 75 MG tablet Take 1 tablet (75 mg total) by mouth daily. 90 tablet 3  . Evolocumab (REPATHA SURECLICK) 970 MG/ML SOAJ Inject 140 mg into the skin every 14 (fourteen) days. 6 pen 3  . famotidine (PEPCID) 20 MG tablet Take 20 mg by mouth 2 (two) times daily as needed for heartburn or indigestion.    . gabapentin (NEURONTIN) 300 MG capsule Take 1 capsule by mouth 3 (three) times daily as needed (nerve pain).   0  . isosorbide mononitrate (IMDUR) 60 MG 24 hr tablet Take 1.5 tablets (90 mg total) by mouth daily. HOLD until BP stable, see instructions    . levothyroxine (SYNTHROID, LEVOTHROID)  88 MCG tablet Take 1 tablet (88 mcg total) by mouth daily before breakfast. 30 tablet 3  . Menthol, Topical Analgesic, (ICY HOT EX) Apply 1 patch topically daily.    . methocarbamol (ROBAXIN) 500 MG tablet Take 2 tablets (1,000 mg total) by mouth every 8 (eight) hours as needed (shoulder pain). 30 tablet 0  . Multiple Vitamin (MULTIVITAMIN WITH MINERALS) TABS tablet Take 1 tablet by mouth daily.    . nitroGLYCERIN (NITROSTAT) 0.4 MG SL tablet DISSOLVE 1 TABLET UNDER THE TONGUE EVERY 5 MINUTES AS NEEDED FOR CHEST PAIN AS DIRECTED 75 tablet 3  . pantoprazole (PROTONIX) 40 MG tablet Take 1 tablet (40 mg total) by mouth daily. 90 tablet 1  . rosuvastatin (CRESTOR) 40 MG tablet Take 1 tablet (40 mg total) by mouth daily. 90 tablet 3   No current facility-administered medications on file prior to visit.     BP (!) 130/52 (BP Location: Right Arm, Patient Position: Sitting, Cuff Size: Large)   Pulse 64   Temp 98.1 F (36.7 C) (Oral)   Resp 16   Ht 5\' 4"  (1.626 m)   Wt 193 lb 9.6 oz (87.8 kg)   SpO2 95%   BMI 33.23 kg/m       Objective:   Physical Exam  Constitutional: She is oriented to person, place, and time. She appears well-developed and well-nourished.  HENT:  Head: Normocephalic and atraumatic.  Cardiovascular: Normal rate, regular rhythm and normal heart sounds.  No murmur heard. Pulmonary/Chest: Effort normal and breath sounds normal. No respiratory distress. She has no wheezes.  Neurological: She is alert and oriented to person, place, and time.  Skin: Skin is warm and dry.  Psychiatric: She has a normal mood and affect. Her behavior is normal. Judgment and thought  content normal.          Assessment & Plan:  HTN- BP looks ok today,  Will resume imdur, remain off of hctz.  Daughter is requesting a [facility] referral for the patient. Will request.   Pneumonia- Clinically improved.  Follow up cxr is obtained and shows near resolution of her PNA. She has one more day of  antibiotics and is advised to complete.  Hypothyroid- continue decreased dose of synthroid. She will need follow up TSH when she sees Dr. Charlett Blake on 04/07/18.  Norovirus-clinically  resolved.      Addendum:  ALT was mildly elevated.  Will need repeat LFT at her follow up visit in addition to TSH.

## 2018-03-14 NOTE — Patient Outreach (Signed)
[place] [facility]) Care Management  03/14/2018  [name] 1946/10/27 [id number]  EMMI-Pneumonia Red Alert referral-day#1, 03/11/2018; Reason: Scheduled follow up appointment? "no"  EMMI-Red Alert Day#2, 03/12/2018; Reason: Wheezing ? "Yes" Been confused? "yes"  EMMI_Red Alert-Day#, 03/13/2018; Have a way,  been to follow up appointment? "no" Been confused? -yes"  Telephone call x 2 to patient: patient states she has just attended her MD follow up appointment and is on the way home now. States she had blood work & repeat  chest xray done. Per office notes -patient received instructions on medications.  Patient voices that she has follow up appointment scheduled but will notify office if she continued to have problems or gets worse.   EMMI addressed.  Plan: Close case.  Sherrin Daisy, RN BSN [place] Management Coordinator [facility] Care Management  [phone]

## 2018-03-14 NOTE — Patient Instructions (Addendum)
Complete lab work prior to leaving. Complete chest x ray on the first floor. Complete antibiotics. Remain off of amlodipine. You may restart the imdur 1 tab once daily. Please contact GYN to schedule an appointment- Dr. Ihor Dow [phone]

## 2018-03-14 NOTE — Telephone Encounter (Signed)
Patient and daughter are requesting a referral to [facility] please.

## 2018-03-16 ENCOUNTER — Encounter: Payer: Self-pay | Admitting: Family

## 2018-03-17 ENCOUNTER — Other Ambulatory Visit: Payer: Self-pay

## 2018-03-17 ENCOUNTER — Telehealth: Payer: Self-pay | Admitting: Family Medicine

## 2018-03-17 NOTE — Patient Outreach (Signed)
[facility]) Care Management  03/17/2018  [name] April 13, 1946 [id number]    EMMI-PNA RED ON EMMI ALERT Day # 4 Date: 03/14/18 Red Alert Reason: "Fever or chills? Yes   Swelling in hands/feet or changes in weight? Yes   Wake up with SOB when lying flat? Yes"    Incoming call from patient returning RN CM call. Spoke with patient and reviewed and addressed red alerts. Patient states she was having some mild fever and chills on Friday but has had none since. She voiced that she continues to have some occasional SOB but symptoms have not worsened. She states she saw PCP on Friday and was evaluated. She has completed antibiotic therapy. RN CM reviewed with patient s/s of worsening condition and when to seek medical attention. Patient voiced understanding. She denies any further RN CM needs or concerns at this time. Advised patient that they would continue to get automated EMMI-PNA post discharge calls to assess how they are doing following recent hospitalization and will receive a call from a nurse if any of their responses were abnormal. Patient voiced understanding and was appreciative of f/u call.   Plan: RN CM will close case at this time.    Enzo Montgomery, RN,BSN,CCM [place] Management Telephonic Care Management Coordinator Direct Phone: [phone] Toll Free: [phone] Fax: [phone]

## 2018-03-17 NOTE — Patient Outreach (Signed)
[facility]) Care Management  03/17/2018  JEWELIANNA PANCOAST 1946/07/27 [id number]     EMMI-PNA RED ON EMMI ALERT Day # 4 Date: 03/14/18 Red Alert Reason: "Fever or chills? Yes   Swelling in hands/feet or changes in weight? Yes   Wake up with SOB when lying flat? Yes"     Outreach attempt # 1 to attempt. RN CM left HIPAA compliant voicemail message along with contact info.        Plan: RN CM will make outreach attempt to patient within three business days. RN CM will send unsuccessful outreach letter to patient.  Enzo Montgomery, RN,BSN,CCM [place] Management Telephonic Care Management Coordinator Direct Phone: [phone] Toll Free: [phone] Fax: [phone]

## 2018-03-17 NOTE — Telephone Encounter (Signed)
Copied from [place] [id number]. Topic: Inquiry >> Mar 17, 2018 12:34 PM Margot Ables wrote: Reason for CRM: pt calling to check on xray results and lab results. She said she was supposed to get call Friday afternoon or Monday morning.

## 2018-03-17 NOTE — Progress Notes (Signed)
This encounter was created in error - please disregard.

## 2018-03-18 ENCOUNTER — Other Ambulatory Visit: Payer: Self-pay

## 2018-03-18 NOTE — Patient Outreach (Signed)
[place] [facility]) Care Management  03/18/2018  [name] July 10, 1946 [id number]     EMMI-PNA RED ON EMMI ALERT Day # 7 Date: 03/17/18 Red Alert Reason: "fever or chills? Yes   Wheezing more than yesterday? Yes"     Referral received. No outreach to patient warranted at this time. RN CM discussed and addressed issues on previous call. Patient will continue to get automated post discharge EMMI-PNA calls and RN CM will contact patient for any abnormal/red alerts recorded.        Plan: RN CM will close case at this time.    Enzo Montgomery, RN,BSN,CCM [place] Management Telephonic Care Management Coordinator Direct Phone: [phone] Toll Free: [phone] Fax: [phone]

## 2018-03-19 ENCOUNTER — Other Ambulatory Visit: Payer: Self-pay

## 2018-03-19 NOTE — Patient Outreach (Signed)
[place] [facility]) Care Management  03/19/2018  [name] 1946-09-09 [id number]       EMMI- PNA RED ON EMMI ALERT Day # 8 Date: 03/18/18 Red Alert Reason: "Swelling in hands/feet or changes in weight? Yes    Eating habits almost back to normal? No"   Outreach attempt # 1 to patient.  No answer at present. RN CM left HIPAA compliant voicemail message along with contact info.         Plan: RN CM will make outreach attempt to patient within one business day if no return call.   Enzo Montgomery, RN,BSN,CCM [place] Management Telephonic Care Management Coordinator Direct Phone: [phone] Toll Free: [phone] Fax: [phone]

## 2018-03-20 ENCOUNTER — Other Ambulatory Visit: Payer: Self-pay

## 2018-03-20 NOTE — Telephone Encounter (Signed)
Letter was mailed to patient.

## 2018-03-20 NOTE — Patient Outreach (Signed)
[place] [facility]) Care Management  03/20/2018  Daila T Mulhall [birth year] [id number]    EMMI- PNA RED ON EMMI ALERT Day # 8 Date: 03/18/18 Red Alert Reason: "Swelling in hands/feet or changes in weight? Yes    Eating habits almost back to normal? No"     Outreach attempt #2 to patient. Spoke with patient. Reviewed and addressed red alerts. Patient denied issues and just kept stating she is fine and does not need anything. She then proceeded to end the call.       Plan: RN CM will close case at this time as no further interventions needed.    Enzo Montgomery, RN,BSN,CCM [place] Management Telephonic Care Management Coordinator Direct Phone: [phone] Toll Free: [phone] Fax: [phone]

## 2018-03-21 ENCOUNTER — Other Ambulatory Visit: Payer: Self-pay

## 2018-03-21 NOTE — Patient Outreach (Signed)
[name] [facility]) Care Management  03/21/2018  [name] [birth year] [id number]     EMMI-PNA RED ON EMMI ALERT Day # 10 Date:03/20/18 Red Alert Reason: "Fever or chills? Yes  Swelling in hands/feet or changes in wgt? Yes    Red on EMMI dashboard received. No outreach call warranted to patient at this time. RN CM addressed issue on previous call. Patient continues to deny the symptoms that she is flagging red for.      Plan: RN CM will close case at this time.    Enzo Montgomery, RN,BSN,CCM [place] Management Telephonic Care Management Coordinator Direct Phone: [phone] Toll Free: [phone] Fax: [phone]

## 2018-03-24 ENCOUNTER — Other Ambulatory Visit: Payer: Self-pay

## 2018-03-24 NOTE — Patient Outreach (Signed)
[place] [facility]) Care Management  03/24/2018  JOCELYNNE DUQUETTE [birth year] [id number]     EMMI-PNA RED ON EMMI ALERT Day # 13 Date: 03/23/18 Red Alert Reason: "Had a flu shot this year? No"   Outreach attempt # 1 to patient. Spoke with patient who voices she is doing fairly well.  Reviewed and addressed red alert with patient. She voices that she got a flu vaccine years ago and it caused her to get so sick that sh e will "never ever take another one." She voiced that she does normally get the PNA vaccine. She is inquiring about if is time for her to get another one. Per [facility] records patient's last PNA vaccine was June 2013. She is aware of the need to get vaccinated every five years. She voices she has an MD appt on 04/07/18 and will mention it to MD during that time. Otherwise, she voice no further RN CM needs or concerns at this time.        Plan: RN CM will close case at this time as no further interventions needed.    Enzo Montgomery, RN,BSN,CCM [place] Management Telephonic Care Management Coordinator Direct Phone: [phone] Toll Free: [phone] Fax: [phone]

## 2018-04-04 ENCOUNTER — Encounter: Payer: Self-pay | Admitting: Obstetrics & Gynecology

## 2018-04-04 ENCOUNTER — Other Ambulatory Visit (HOSPITAL_COMMUNITY)
Admission: RE | Admit: 2018-04-04 | Discharge: 2018-04-04 | Disposition: A | Discharge status: Home / Self Care | Payer: Medicare Other | Source: Ambulatory Visit | Attending: Obstetrics & Gynecology | Admitting: Obstetrics & Gynecology

## 2018-04-04 ENCOUNTER — Encounter: Payer: Medicare Other | Admitting: Obstetrics & Gynecology

## 2018-04-04 ENCOUNTER — Ambulatory Visit (HOSPITAL_BASED_OUTPATIENT_CLINIC_OR_DEPARTMENT_OTHER): Payer: Medicare Other

## 2018-04-04 ENCOUNTER — Other Ambulatory Visit (HOSPITAL_BASED_OUTPATIENT_CLINIC_OR_DEPARTMENT_OTHER): Payer: Medicare Other

## 2018-04-04 ENCOUNTER — Ambulatory Visit (INDEPENDENT_AMBULATORY_CARE_PROVIDER_SITE_OTHER): Payer: Medicare Other | Admitting: Obstetrics & Gynecology

## 2018-04-04 VITALS — BP 148/65 | HR 75 | Ht 64.0 in | Wt 195.0 lb

## 2018-04-04 DIAGNOSIS — N950 Postmenopausal bleeding: Secondary | ICD-10-CM | POA: Insufficient documentation

## 2018-04-04 DIAGNOSIS — N882 Stricture and stenosis of cervix uteri: Secondary | ICD-10-CM | POA: Diagnosis not present

## 2018-04-04 DIAGNOSIS — Z01419 Encounter for gynecological examination (general) (routine) without abnormal findings: Secondary | ICD-10-CM | POA: Diagnosis not present

## 2018-04-04 MED ORDER — MISOPROSTOL 200 MCG PO TABS: ORAL_TABLET | ORAL | 0 refills | Status: DC

## 2018-04-04 NOTE — Progress Notes (Signed)
Patient reports some bleeding 3 weeks ago. Patient only noticed the bleeding when she wiped. Patient has not been seen by a GYN in 7 years she reports. Kathrene Alu RN

## 2018-04-04 NOTE — Progress Notes (Signed)
History:  72 y.o. [id number] here today for PMP bleeding noted 4 weeks prev. (Pt has not had bleeding since that time). LMP in her 71s.  Pt denies weight loss. No fevers at night or other constitutional sx. Pt recently had pneumonia. Pt denies abd pain.   When she had the pneumonia she dod have some soreness with coughing  But, that has resolved. Pts last PAP was > 7 years prev.   The following portions of the patient's history were reviewed and updated as appropriate: allergies, current medications, past family history, past medical history, past social history, past surgical history and problem list.  Review of Systems:  Pertinent items are noted in HPI.    Objective:  Physical Exam Blood pressure (!) 148/65, pulse 75, height 5\' 4"  (1.626 m), weight 195 lb (88.5 kg).  CONSTITUTIONAL: Well-developed, well-nourished female in no acute distress.  HENT:  Normocephalic, atraumatic EYES: Conjunctivae and EOM are normal. No scleral icterus.  NECK: Normal range of motion SKIN: Skin is warm and dry. No rash noted. Not diaphoretic.No pallor. [place]: Alert and oriented to person, place, and time. Normal coordination.  Lungs: CTA CV: RRR Abd: Soft, nontender and nondistended Pelvic: Normal appearing external genitalia- some atrophy; normal appearing vaginal mucosa and cervix.  Normal discharge.  Small uterus, no other palpable masses, no uterine or adnexal tenderness  The indications for endometrial biopsy were reviewed.   Risks of the biopsy including cramping, bleeding, infection, uterine perforation, inadequate specimen and need for additional procedures  were discussed. The patient states she understands and agrees to undergo procedure today. Consent was signed. Time out was performed. A sterile speculum was placed in the patient's vagina and the cervix was prepped with Betadine. A single-toothed tenaculum was placed on the anterior lip of the cervix to stabilize it. An attempt was made to introduce  the 3 mm pipelle into the endometrial cavity multiple attempts were made without success. I attempted to access the endometrium using dilators also without success. The instruments were removed from the patient's vagina. Minimal bleeding from the cervix was noted. The patient tolerated the procedure well.  Labs and Imaging 04/25/2011 Adequacy Reason Satisfactory for evaluation. The presence or absence of an endocervical / transformation zone component cannot be determined because of atrophy. Diagnosis NEGATIVE FOR INTRAEPITHELIAL LESIONS OR MALIGNANCY.  Assessment & Plan:  72 yo with PMPB. Unable to complete endobx due to stenosis.  Pelvic US is endometrial strip is <96mm will not proceed with repeat attempt at bx at present.  If the endometrial strips is >48mm will have pt take cytotec 410mcg per vagina 8 hour prior to the procedure and f/u for endo bx   F/u PAP done today Cytotec 479mcg per vagina 8 hours prior to procedure ordered.   Routine post-procedure instructions were given to the patient. We will contact the patient regarding the results of the [place] and for further management.    Shilo Philipson L. Harraway-Smith, M.D., [place]

## 2018-04-05 ENCOUNTER — Ambulatory Visit (HOSPITAL_BASED_OUTPATIENT_CLINIC_OR_DEPARTMENT_OTHER)
Admission: RE | Admit: 2018-04-05 | Discharge: 2018-04-05 | Disposition: A | Discharge status: Home / Self Care | Payer: Medicare Other | Source: Ambulatory Visit | Attending: Obstetrics & Gynecology | Admitting: Obstetrics & Gynecology

## 2018-04-05 DIAGNOSIS — N950 Postmenopausal bleeding: Secondary | ICD-10-CM

## 2018-04-06 ENCOUNTER — Other Ambulatory Visit: Payer: Self-pay | Admitting: Family Medicine

## 2018-04-06 DIAGNOSIS — E892 Postprocedural hypoparathyroidism: Secondary | ICD-10-CM

## 2018-04-07 ENCOUNTER — Ambulatory Visit (INDEPENDENT_AMBULATORY_CARE_PROVIDER_SITE_OTHER): Payer: Medicare Other | Admitting: Family Medicine

## 2018-04-07 ENCOUNTER — Encounter: Payer: Self-pay | Admitting: Family Medicine

## 2018-04-07 ENCOUNTER — Telehealth: Payer: Self-pay

## 2018-04-07 ENCOUNTER — Ambulatory Visit (HOSPITAL_BASED_OUTPATIENT_CLINIC_OR_DEPARTMENT_OTHER): Payer: Medicare Other

## 2018-04-07 VITALS — BP 136/80 | HR 64 | Temp 97.6°F | Resp 18 | Wt 195.2 lb

## 2018-04-07 DIAGNOSIS — E039 Hypothyroidism, unspecified: Secondary | ICD-10-CM

## 2018-04-07 DIAGNOSIS — I10 Essential (primary) hypertension: Secondary | ICD-10-CM | POA: Diagnosis not present

## 2018-04-07 DIAGNOSIS — K5900 Constipation, unspecified: Secondary | ICD-10-CM

## 2018-04-07 DIAGNOSIS — J189 Pneumonia, unspecified organism: Secondary | ICD-10-CM | POA: Diagnosis not present

## 2018-04-07 DIAGNOSIS — E892 Postprocedural hypoparathyroidism: Secondary | ICD-10-CM | POA: Diagnosis not present

## 2018-04-07 DIAGNOSIS — E7849 Other hyperlipidemia: Secondary | ICD-10-CM

## 2018-04-07 DIAGNOSIS — R109 Unspecified abdominal pain: Secondary | ICD-10-CM | POA: Diagnosis not present

## 2018-04-07 LAB — CYTOLOGY - PAP
Diagnosis: NEGATIVE
HPV (WINDOPATH): NOT DETECTED

## 2018-04-07 MED ORDER — HYOSCYAMINE SULFATE 0.125 MG SL SUBL: 0.1250 mg | SUBLINGUAL_TABLET | SUBLINGUAL | 2 refills | Status: DC | PRN

## 2018-04-07 MED ORDER — CALCITRIOL 0.25 MCG PO CAPS: ORAL_CAPSULE | ORAL | 0 refills | Status: DC

## 2018-04-07 NOTE — Telephone Encounter (Signed)
Patient called back and made aware that ultrasound was negative and we will not need to do the endometrial biopsy. Patient states understanding.  Made her aware that if she starts to have bleeding again she needs to make our office aware of this. Will follow up in three months per Dr Ihor Dow. Kathrene Alu RN

## 2018-04-07 NOTE — Telephone Encounter (Signed)
-----   Message from Lavonia Drafts, MD sent at 04/06/2018 11:15 AM EDT ----- Please call pt. Her endometrial stripe 12mm. We can forgo attempting another bx AS LONG AS she has no further bleeding. If she does have bleeding she should call us at that time and follow up.  She can cancel her appt for the endo bx.  Ask her to make an appt in 3 months just for f/u.  Thx,  clh-S

## 2018-04-07 NOTE — Telephone Encounter (Signed)
Left message for patient to return call to office for results. Jennifer Howard RN  

## 2018-04-07 NOTE — Progress Notes (Signed)
Subjective:  I acted as a Education administrator for Dr. Charlett Blake. [place], [place]  Patient ID: [name], adult    DOB: 07-14-1946, 72 y.o.   MRN: [id number]  No chief complaint on file.   HPI  Patient is in today for a 1 month follow up and she is feeling better but she did have a recent episode of nausea, vomiting and diarrhea. These symptoms have improved but she continues to have intermittent episodes of lower abdominal cramping since then. No fevers, chills, bloody or tarry stool. Denies CP/palp/SOB/HA/congestion/fevers or GU c/o. Taking meds as prescribed  Patient Care Team: Mosie Lukes, MD as PCP - General (Family Medicine) Ladene Artist, MD as Consulting Physician (Gastroenterology) Fay Records, MD as Consulting Physician (Cardiology) Suella Broad, MD as Consulting Physician (Physical Medicine and Rehabilitation)   Past Medical History:  Diagnosis Date  . Acute MI, subendocardial ([place])   . Back pain 05/31/2013  . Benign paroxysmal positional vertigo 08/05/2016  . CAD (coronary artery disease)    a. NSTEMI 10/2011: [place] 11/19/11: pLAD 30%, oOM 60%, AVCFX 30%, CFX after OM2 30%, pRCA 60 and 70%, then 99%, AM 80-90% with TIMI 3 flow.  PCI: Promus DES x 2 to RCA; b. 06/2012 Cath: patent RCA stents w/ subtl occl of Acute Marginal (jailed)->Med rx; c. 05/2015 MV: EF 59%, mod mid infsept/inf/ap lat/ap infarct with peri-infarct isch-->Med Rx; d. 02/2016 Cath: LM nl, LAD 23m, RI 50, RCA patent stents.  . Colitis   . Facial skin lesion 01/28/2017  . GERD (gastroesophageal reflux disease)    occasional  . Hyperlipidemia   . Hypertension   . Hypertensive heart disease    a. Echocardiogram 11/19/11: Difficult acoustic windows, EF 60-65%, normal LV wall thickness, grade 1 diastolic dysfunction  . Hypoparathyroidism ([place])   . Low zinc level 11/26/2016  . Multinodular thyroid    Goiter s/p thyroidectomy in 2007 with post-op  hypocalcemia and post-op hypothyroidism/hypoparathyroidism with  hypocalcemia  . Neck pain on right side 07/13/2013  . Nocturia 05/31/2013  . Pain in right axilla 03/13/2016  . Palpitations    a. 03/2012 - patient set up for event monitor but did not wear correctly -  she declined wearing a repeat monitor  . Post-surgical hypothyroidism   . Sun-damaged skin 12/05/2014  . Tubular adenoma of colon 06/2011  . Unspecified constipation 05/31/2013  . Vertigo   . Zinc deficiency 11/26/2015    Past Surgical History:  Procedure Laterality Date  . CARDIAC CATHETERIZATION N/A 03/08/2016   Procedure: Left Heart Cath and Coronary Angiography;  Surgeon: Jettie Booze, MD;  Location: [facility];  Service: Cardiovascular;  Laterality: N/A;  . COLONOSCOPY  Aenomatous polyps   07/05/2011  . COLONOSCOPY N/A 09/28/2014   Procedure: COLONOSCOPY;  Surgeon: Ladene Artist, MD;  Location: [facility];  Service: Endoscopy;  Laterality: N/A;  . CORONARY ANGIOPLASTY WITH STENT PLACEMENT    . LEFT HEART CATHETERIZATION WITH CORONARY ANGIOGRAM N/A 07/17/2012   Procedure: LEFT HEART CATHETERIZATION WITH CORONARY ANGIOGRAM;  Surgeon: Hillary Bow, MD;  Location: [facility] CATH LAB;  Service: Cardiovascular;  Laterality: N/A;  . PERCUTANEOUS CORONARY STENT INTERVENTION (PCI-S) N/A 11/19/2011   Procedure: PERCUTANEOUS CORONARY STENT INTERVENTION (PCI-S);  Surgeon: Hillary Bow, MD;  Location: [facility] CATH LAB;  Service: Cardiovascular;  Laterality: N/A;  . SHOULDER ARTHROSCOPY W/ ROTATOR CUFF REPAIR     right  . TOTAL THYROIDECTOMY  2007   GOITER    Family History  Problem  Relation Age of Onset  . Colon cancer Brother   . Cancer Brother        COLON  . Hypertension Father   . Heart disease Father   . Heart attack Father   . Blindness Sister   . Congestive Heart Failure Sister   . Hypertension Sister   . Thyroid disease Sister   . Cancer Brother        multiple myelomas  . Diabetes Neg Hx   . Prostate cancer Neg Hx   . Breast cancer Neg Hx     Social History    Socioeconomic History  . Marital status: Married    Spouse name: Not on file  . Number of children: 4  . Years of education: 35  . Highest education level: Not on file  Occupational History  . Occupation: Press photographer person at [employer]  . Financial resource strain: Not on file  . Food insecurity:    Worry: Not on file    Inability: Not on file  . Transportation needs:    Medical: Not on file    Non-medical: Not on file  Tobacco Use  . Smoking status: Never Smoker  . Smokeless tobacco: Never Used  Substance and Sexual Activity  . Alcohol use: No  . Drug use: No  . Sexual activity: Not Currently  Lifestyle  . Physical activity:    Days per week: Not on file    Minutes per session: Not on file  . Stress: Not on file  Relationships  . Social connections:    Talks on phone: Not on file    Gets together: Not on file    Attends religious service: Not on file    Active member of club or organization: Not on file    Attends meetings of clubs or organizations: Not on file    Relationship status: Not on file  . Intimate partner violence:    Fear of current or ex partner: Not on file    Emotionally abused: Not on file    Physically abused: Not on file    Forced sexual activity: Not on file  Other Topics Concern  . Not on file  Social History Narrative   Lives at home alone.  Her son lives near her.   Right-handed.   1 cup coffee per day.    Outpatient Medications Prior to Visit  Medication Sig Dispense Refill  . ALPRAZolam (XANAX) 0.25 MG tablet Take 1 tablet (0.25 mg total) by mouth 2 (two) times daily as needed for anxiety. 5 tablet 0  . amLODipine (NORVASC) 10 MG tablet Take 1 tablet (10 mg total) by mouth daily. HOLD until BP stable, see instructions 90 tablet 3  . aspirin EC 81 MG tablet Take 81 mg by mouth daily.      . B Complex-C (B-COMPLEX WITH VITAMIN C) tablet Take 1 tablet by mouth daily.    . calcium carbonate (TUMS - DOSED IN MG ELEMENTAL CALCIUM) 500  MG chewable tablet Chew 4 tablets by mouth 2 (two) times daily.     . carvedilol (COREG) 3.125 MG tablet Take 1 tablet (3.125 mg total) by mouth 2 (two) times daily. 60 tablet 3  . cholecalciferol (VITAMIN D) 1000 UNITS tablet Take 1,000 Units by mouth daily.    . clopidogrel (PLAVIX) 75 MG tablet Take 1 tablet (75 mg total) by mouth daily. 90 tablet 3  . Evolocumab (REPATHA SURECLICK) 161 MG/ML SOAJ Inject 140 mg into the skin every  14 (fourteen) days. 6 pen 3  . gabapentin (NEURONTIN) 300 MG capsule Take 1 capsule by mouth 3 (three) times daily as needed (nerve pain).   0  . isosorbide mononitrate (IMDUR) 60 MG 24 hr tablet Take 1 tablet (60 mg total) by mouth daily. HOLD until BP stable, see instructions    . levothyroxine (SYNTHROID, LEVOTHROID) 88 MCG tablet Take 1 tablet (88 mcg total) by mouth daily before breakfast. 30 tablet 3  . misoprostol (CYTOTEC) 200 MCG tablet Place two tablets in the vagina the night prior to your next clinic appointment 2 tablet 0  . Multiple Vitamin (MULTIVITAMIN WITH MINERALS) TABS tablet Take 1 tablet by mouth daily.    . nitroGLYCERIN (NITROSTAT) 0.4 MG SL tablet DISSOLVE 1 TABLET UNDER THE TONGUE EVERY 5 MINUTES AS NEEDED FOR CHEST PAIN AS DIRECTED 75 tablet 3  . pantoprazole (PROTONIX) 40 MG tablet Take 1 tablet (40 mg total) by mouth daily. 90 tablet 1  . rosuvastatin (CRESTOR) 40 MG tablet Take 1 tablet (40 mg total) by mouth daily. 90 tablet 3  . calcitRIOL (ROCALTROL) 0.25 MCG capsule TAKE 3 CAPSULES(0.75 MCG) BY MOUTH DAILY 270 capsule 0   No facility-administered medications prior to visit.     Allergies  Allergen Reactions  . Zetia [Ezetimibe] Other (See Comments)    Myalgia, paresthesias and weakness    Review of Systems  Constitutional: Negative for fever and malaise/fatigue.  HENT: Negative for congestion.   Eyes: Negative for blurred vision.  Respiratory: Negative for shortness of breath.   Cardiovascular: Negative for chest pain,  palpitations and leg swelling.  Gastrointestinal: Positive for abdominal pain. Negative for blood in stool and nausea.  Genitourinary: Negative for dysuria and frequency.  Musculoskeletal: Negative for falls.  Skin: Negative for rash.  Neurological: Negative for dizziness, loss of consciousness and headaches.  Endo/Heme/Allergies: Negative for environmental allergies.  Psychiatric/Behavioral: Negative for depression. The patient is not nervous/anxious.        Objective:    Physical Exam  Constitutional: She is oriented to person, place, and time. She appears well-developed and well-nourished. No distress.  HENT:  Head: Normocephalic and atraumatic.  Nose: Nose normal.  Eyes: Right eye exhibits no discharge. Left eye exhibits no discharge.  Neck: Normal range of motion. Neck supple.  Cardiovascular: Normal rate and regular rhythm.  No murmur heard. Pulmonary/Chest: Effort normal and breath sounds normal.  Abdominal: Soft. Bowel sounds are normal. There is no tenderness.  Musculoskeletal: She exhibits no edema.  Neurological: She is alert and oriented to person, place, and time.  Skin: Skin is warm and dry.  Psychiatric: She has a normal mood and affect.  Nursing note and vitals reviewed.   BP 136/80 (BP Location: Left Arm, Patient Position: Sitting, Cuff Size: Normal)   Pulse 64   Temp 97.6 F (36.4 C) (Oral)   Resp 18   Wt 195 lb 3.2 oz (88.5 kg)   SpO2 97%   BMI 33.51 kg/m  Wt Readings from Last 3 Encounters:  04/07/18 195 lb 3.2 oz (88.5 kg)  04/04/18 195 lb (88.5 kg)  03/14/18 193 lb 9.6 oz (87.8 kg)   BP Readings from Last 3 Encounters:  04/07/18 136/80  04/04/18 (!) 148/65  03/14/18 (!) 130/52     Immunization History  Administered Date(s) Administered  . Influenza,inj,Quad PF,6+ Mos 09/14/2013  . Pneumococcal Conjugate-13 09/12/2015  . Pneumococcal Polysaccharide-23 06/05/2012  . Tdap 12/27/2003, 03/15/2014    Health Maintenance  Topic Date Due  .  Hepatitis C Screening  June 17, 1946  . COLONOSCOPY  12/23/2018 (Originally 09/28/2017)  . INFLUENZA VACCINE  07/24/2018  . PAP SMEAR  04/05/2019  . MAMMOGRAM  10/09/2019  . TETANUS/TDAP  03/15/2024  . DEXA SCAN  Completed  . PNA vac Low Risk Adult  Completed    Lab Results  Component Value Date   WBC 9.1 03/14/2018   HGB 13.0 03/14/2018   HCT 39.2 03/14/2018   PLT 324.0 03/14/2018   GLUCOSE 91 03/14/2018   CHOL 79 02/17/2018   TRIG 153.0 (H) 02/17/2018   HDL 40.50 02/17/2018   LDLDIRECT 83.8 08/11/2012   LDLCALC 8 02/17/2018   ALT 37 (H) 03/14/2018   AST 27 03/14/2018   NA 142 03/14/2018   K 4.3 03/14/2018   CL 103 03/14/2018   CREATININE 0.99 03/14/2018   BUN 20 03/14/2018   CO2 28 03/14/2018   TSH 0.159 (L) 03/06/2018   INR 0.86 03/07/2016   HGBA1C 5.9 05/07/2016    Lab Results  Component Value Date   TSH 0.159 (L) 03/06/2018   Lab Results  Component Value Date   WBC 9.1 03/14/2018   HGB 13.0 03/14/2018   HCT 39.2 03/14/2018   MCV 84.2 03/14/2018   PLT 324.0 03/14/2018   Lab Results  Component Value Date   NA 142 03/14/2018   K 4.3 03/14/2018   CO2 28 03/14/2018   GLUCOSE 91 03/14/2018   BUN 20 03/14/2018   CREATININE 0.99 03/14/2018   BILITOT 0.3 03/14/2018   ALKPHOS 83 03/14/2018   AST 27 03/14/2018   ALT 37 (H) 03/14/2018   PROT 7.8 03/14/2018   ALBUMIN 4.0 03/14/2018   CALCIUM 8.5 03/14/2018   ANIONGAP 11 03/08/2018   GFR 58.76 (L) 03/14/2018   Lab Results  Component Value Date   CHOL 79 02/17/2018   Lab Results  Component Value Date   HDL 40.50 02/17/2018   Lab Results  Component Value Date   LDLCALC 8 02/17/2018   Lab Results  Component Value Date   TRIG 153.0 (H) 02/17/2018   Lab Results  Component Value Date   CHOLHDL 2 02/17/2018   Lab Results  Component Value Date   HGBA1C 5.9 05/07/2016         Assessment & Plan:   Problem List Items Addressed This Visit    Hypothyroidism    On Levothyroxine, continue to  monitor      Hyperlipidemia    Encouraged heart healthy diet, increase exercise, avoid trans fats, consider a krill oil cap daily      Essential hypertension, benign    Well controlled, no changes to meds. Encouraged heart healthy diet such as the DASH diet and exercise as tolerated.       Constipation    Encouraged increased hydration and fiber in diet. Daily probiotics. If bowels not moving can use MOM 2 tbls po in 4 oz of warm prune juice by mouth every 2-3 days. If no results then repeat in 4 hours with  Dulcolax suppository pr, may repeat again in 4 more hours as needed. Seek care if symptoms worsen. Consider daily Miralax and/or Dulcolax if symptoms persist. Is noting some intermittent lower abdominal pain s/p an episode of nausea, vomiting and diarrhea recently. These symptoms have resolved but lower abdominal cramping has begun to occur intermittently. Is given a prescription for Hyoscyamine to try prn      Postsurgical hypoparathyroidism ([facility])   Relevant Medications   calcitRIOL (ROCALTROL) 0.25 MCG capsule  Other Visit Diagnoses    Pneumonia of left lung due to infectious organism, unspecified part of lung    -  Primary   Relevant Orders   DG Chest 2 View   Abdominal cramping          I am having Kajah T. Sanderfer start on hyoscyamine. I am also having her maintain her calcium carbonate, aspirin EC, B-complex with vitamin C, cholecalciferol, gabapentin, multivitamin with minerals, nitroGLYCERIN, Evolocumab, clopidogrel, rosuvastatin, ALPRAZolam, amLODipine, levothyroxine, carvedilol, pantoprazole, isosorbide mononitrate, misoprostol, and calcitRIOL.  Meds ordered this encounter  Medications  . calcitRIOL (ROCALTROL) 0.25 MCG capsule    Sig: TAKE 3 CAPSULES(0.75 MCG) BY MOUTH DAILY    Dispense:  270 capsule    Refill:  0  . hyoscyamine (LEVSIN SL) 0.125 MG SL tablet    Sig: Place 1 tablet (0.125 mg total) under the tongue every 4 (four) hours as needed.    Dispense:   30 tablet    Refill:  2    CMA served as scribe during this visit. History, Physical and Plan performed by medical provider. Documentation and orders reviewed and attested to.  Penni Homans, MD

## 2018-04-07 NOTE — Patient Instructions (Signed)

## 2018-04-14 NOTE — Assessment & Plan Note (Signed)
Well controlled, no changes to meds. Encouraged heart healthy diet such as the DASH diet and exercise as tolerated.  °

## 2018-04-14 NOTE — Assessment & Plan Note (Addendum)
Encouraged increased hydration and fiber in diet. Daily probiotics. If bowels not moving can use MOM 2 tbls po in 4 oz of warm prune juice by mouth every 2-3 days. If no results then repeat in 4 hours with  Dulcolax suppository pr, may repeat again in 4 more hours as needed. Seek care if symptoms worsen. Consider daily Miralax and/or Dulcolax if symptoms persist. Is noting some intermittent lower abdominal pain s/p an episode of nausea, vomiting and diarrhea recently. These symptoms have resolved but lower abdominal cramping has begun to occur intermittently. Is given a prescription for Hyoscyamine to try prn

## 2018-04-14 NOTE — Assessment & Plan Note (Signed)
On Levothyroxine, continue to monitor 

## 2018-04-14 NOTE — Assessment & Plan Note (Signed)
Encouraged heart healthy diet, increase exercise, avoid trans fats, consider a krill oil cap daily 

## 2018-04-17 ENCOUNTER — Ambulatory Visit: Payer: Medicare Other | Admitting: Obstetrics & Gynecology

## 2018-04-24 ENCOUNTER — Ambulatory Visit: Payer: Medicare Other | Admitting: Family Medicine

## 2018-04-25 ENCOUNTER — Telehealth: Payer: Self-pay

## 2018-04-25 NOTE — Telephone Encounter (Signed)
   [facility] Pre-operative Risk Assessment    Request for surgical clearance:  1. What type of surgery is being performed? Cervical ESI   2. When is this surgery scheduled? 05/06/18   3. What type of clearance is required (medical clearance vs. Pharmacy clearance to hold med vs. Both)? Pharmacy clearance to hold med  4. Are there any medications that need to be held prior to surgery and how long? Plavix x 5 day   5. Practice name and name of physician performing surgery? [facility]   6. What is your office phone number [phone]    7.   What is your office fax number [phone]  8.   Anesthesia type (None, local, MAC, general) ? None listed   Jacinta Shoe 04/25/2018, 4:13 PM  _________________________________________________________________   (provider comments below)

## 2018-04-25 NOTE — Telephone Encounter (Signed)
Dr. Harrington Challenger can we hold plavix for 5 days for cervical ESI?  Thanks, please respond to CV pre-op

## 2018-04-28 ENCOUNTER — Ambulatory Visit (HOSPITAL_BASED_OUTPATIENT_CLINIC_OR_DEPARTMENT_OTHER)
Admission: RE | Admit: 2018-04-28 | Discharge: 2018-04-28 | Disposition: A | Discharge status: Home / Self Care | Payer: Medicare Other | Source: Ambulatory Visit | Attending: Family Medicine | Admitting: Family Medicine

## 2018-04-28 DIAGNOSIS — Z09 Encounter for follow-up examination after completed treatment for conditions other than malignant neoplasm: Secondary | ICD-10-CM | POA: Insufficient documentation

## 2018-04-28 DIAGNOSIS — J189 Pneumonia, unspecified organism: Secondary | ICD-10-CM

## 2018-04-28 DIAGNOSIS — Z8701 Personal history of pneumonia (recurrent): Secondary | ICD-10-CM | POA: Insufficient documentation

## 2018-04-28 DIAGNOSIS — R05 Cough: Secondary | ICD-10-CM | POA: Diagnosis not present

## 2018-05-01 ENCOUNTER — Ambulatory Visit (INDEPENDENT_AMBULATORY_CARE_PROVIDER_SITE_OTHER): Payer: Medicare Other | Admitting: Podiatry

## 2018-05-01 DIAGNOSIS — E119 Type 2 diabetes mellitus without complications: Secondary | ICD-10-CM | POA: Diagnosis not present

## 2018-05-01 DIAGNOSIS — L989 Disorder of the skin and subcutaneous tissue, unspecified: Secondary | ICD-10-CM | POA: Diagnosis not present

## 2018-05-01 DIAGNOSIS — B351 Tinea unguium: Secondary | ICD-10-CM | POA: Diagnosis not present

## 2018-05-01 DIAGNOSIS — D689 Coagulation defect, unspecified: Secondary | ICD-10-CM | POA: Diagnosis not present

## 2018-05-01 DIAGNOSIS — E1151 Type 2 diabetes mellitus with diabetic peripheral angiopathy without gangrene: Secondary | ICD-10-CM

## 2018-05-01 MED ORDER — KETOCONAZOLE 2 % EX CREA: TOPICAL_CREAM | CUTANEOUS | 0 refills | Status: DC

## 2018-05-06 DIAGNOSIS — M5416 Radiculopathy, lumbar region: Secondary | ICD-10-CM | POA: Diagnosis not present

## 2018-05-06 DIAGNOSIS — M5030 Other cervical disc degeneration, unspecified cervical region: Secondary | ICD-10-CM | POA: Diagnosis not present

## 2018-05-07 NOTE — Telephone Encounter (Signed)
OK to hold plavix ?

## 2018-05-08 ENCOUNTER — Encounter: Payer: Self-pay | Admitting: Podiatry

## 2018-05-08 ENCOUNTER — Ambulatory Visit (INDEPENDENT_AMBULATORY_CARE_PROVIDER_SITE_OTHER): Payer: Medicare Other | Admitting: Podiatry

## 2018-05-08 DIAGNOSIS — L989 Disorder of the skin and subcutaneous tissue, unspecified: Secondary | ICD-10-CM

## 2018-05-08 DIAGNOSIS — L821 Other seborrheic keratosis: Secondary | ICD-10-CM | POA: Diagnosis not present

## 2018-05-08 NOTE — Progress Notes (Signed)
  Subjective:  Patient ID: MELVENIA FAVELA, adult    DOB: 1946-01-29,  MRN: [id number]  Chief Complaint  Patient presents with  . Procedure    Removal of lesion lower leg left      72 y.o. adult returns for the above complaint. Here for biopsy of L leg as discussed at last visit.  Objective:  There were no vitals filed for this visit. General AA&O x3. Normal mood and affect.  Vascular Pedal pulses palpable.  Neurologic Epicritic sensation grossly intact.  Dermatologic  Atypical nevus L leg with verruciform appearance.  Orthopedic: No pain to palpation either foot.   Assessment & Plan:  Patient was evaluated and treated and all questions answered.  L Leg Lesion -Shave biopsy as below.  Procedure: Shave Biopsy Indication: L Leg lesion Anesthesia: Lidocaine 1% with epi 3 cc Instrumentation: Dermablade Dressing: Dry, sterile, compression dressing. Specimen: Labeled and sent for pathology. Disposition: Patient tolerated procedure well. Leave dressing on for 48 hours. Thereafter dress with antibiotic ointment and band-aid. Off-loading pads dispensed. Patient to return in 1 week for follow-up.  No follow-ups on file.

## 2018-05-08 NOTE — Telephone Encounter (Signed)
I called the patient to make sure she was doing well since her last visit with Dr. Harrington Challenger in January and she informed me that she has already had her back procedure and is not scheduled for another procedure. I will remove request from the pre-op pool.

## 2018-05-20 ENCOUNTER — Ambulatory Visit (INDEPENDENT_AMBULATORY_CARE_PROVIDER_SITE_OTHER): Payer: Medicare Other | Admitting: Internal Medicine

## 2018-05-20 ENCOUNTER — Encounter: Payer: Self-pay | Admitting: Internal Medicine

## 2018-05-20 VITALS — BP 132/68 | HR 69 | Ht 64.0 in | Wt 197.0 lb

## 2018-05-20 DIAGNOSIS — K2100 Gastro-esophageal reflux disease with esophagitis, without bleeding: Secondary | ICD-10-CM

## 2018-05-20 DIAGNOSIS — E782 Mixed hyperlipidemia: Secondary | ICD-10-CM

## 2018-05-20 DIAGNOSIS — I6523 Occlusion and stenosis of bilateral carotid arteries: Secondary | ICD-10-CM | POA: Diagnosis not present

## 2018-05-20 DIAGNOSIS — I10 Essential (primary) hypertension: Secondary | ICD-10-CM

## 2018-05-20 DIAGNOSIS — E039 Hypothyroidism, unspecified: Secondary | ICD-10-CM | POA: Diagnosis not present

## 2018-05-20 DIAGNOSIS — E876 Hypokalemia: Secondary | ICD-10-CM

## 2018-05-20 DIAGNOSIS — E892 Postprocedural hypoparathyroidism: Secondary | ICD-10-CM

## 2018-05-20 DIAGNOSIS — I2510 Atherosclerotic heart disease of native coronary artery without angina pectoris: Secondary | ICD-10-CM

## 2018-05-20 DIAGNOSIS — I739 Peripheral vascular disease, unspecified: Secondary | ICD-10-CM | POA: Diagnosis not present

## 2018-05-20 MED ORDER — PANTOPRAZOLE SODIUM 40 MG PO TBEC: 40.0000 mg | DELAYED_RELEASE_TABLET | Freq: Every day | ORAL | 3 refills | Status: DC

## 2018-05-20 MED ORDER — CARVEDILOL 3.125 MG PO TABS: 3.1250 mg | ORAL_TABLET | Freq: Two times a day (BID) | ORAL | 3 refills | Status: DC

## 2018-05-20 MED ORDER — CALCITRIOL 0.25 MCG PO CAPS: ORAL_CAPSULE | ORAL | 3 refills | Status: DC

## 2018-05-20 MED ORDER — ROSUVASTATIN CALCIUM 40 MG PO TABS: 40.0000 mg | ORAL_TABLET | Freq: Every day | ORAL | 3 refills | Status: DC

## 2018-05-20 MED ORDER — AMLODIPINE BESYLATE 10 MG PO TABS: 10.0000 mg | ORAL_TABLET | Freq: Every day | ORAL | 3 refills | Status: DC

## 2018-05-20 MED ORDER — CLOPIDOGREL BISULFATE 75 MG PO TABS
75.0000 mg | ORAL_TABLET | Freq: Every day | ORAL | 3 refills | Status: DC
Start: 2018-05-20 — End: 2019-03-04

## 2018-05-20 NOTE — Progress Notes (Signed)
Cardiology Office Note   Date:  05/20/2018   ID:  [name], DOB 05-27-1946, MRN [id number]  PCP:  Mosie Lukes, MD  Cardiologist:   Dorris Carnes, MD    F/U of CAD     History of Present Illness: [name] is a 72 y.o. adult with a history of CAD - s/p NSTEMI 10/2011 with DESx2 to RCA and normal EF. She underwent repeat LHC on 07/17/12 Patent RCA stent LCA with collaterals to R acute marginal were unchanged. Subtotal occlusion of acute marginal (exits mid stent). Recomm for medical Rx at that time. Other issues include thyroidectomy, GERD, HTN, HLD, vertigo.Last myoviue showed inferolateral scar with minimal periinfarct ischemia   She was sick while in [place] last summer 2018  .   Had CP  R/O for MI  EKGs negative  Seen by Cardiology  Recm she increase b blockade  Add bisoprolol I last saw her in Jan 2019     Since seen she says she has occasional pinching/twisting sensation in L chest   Can last several seconds   Not associated with activty  She denies claudication  Breathing is OK, occasionally short at night   She'll roll over and go back to sleep  No PND   Current Meds  Medication Sig  . ALPRAZolam (XANAX) 0.25 MG tablet Take 1 tablet (0.25 mg total) by mouth 2 (two) times daily as needed for anxiety.  Marland Kitchen amLODipine (NORVASC) 10 MG tablet Take 1 tablet (10 mg total) by mouth daily. HOLD until BP stable, see instructions  . aspirin EC 81 MG tablet Take 81 mg by mouth daily.    . B Complex-C (B-COMPLEX WITH VITAMIN C) tablet Take 1 tablet by mouth daily.  . bisoprolol (ZEBETA) 5 MG tablet bisoprolol fumarate 5 mg tablet  . calcitRIOL (ROCALTROL) 0.25 MCG capsule TAKE 3 CAPSULES(0.75 MCG) BY MOUTH DAILY  . calcium carbonate (TUMS - DOSED IN MG ELEMENTAL CALCIUM) 500 MG chewable tablet Chew 4 tablets by mouth 2 (two) times daily.   . carvedilol (COREG) 3.125 MG tablet Take 1 tablet (3.125 mg total) by mouth 2 (two) times daily.  . cefpodoxime (VANTIN) 200 MG tablet  cefpodoxime 200 mg tablet  . cholecalciferol (VITAMIN D) 1000 UNITS tablet Take 1,000 Units by mouth daily.  . clopidogrel (PLAVIX) 75 MG tablet Take 1 tablet (75 mg total) by mouth daily.  . Evolocumab (REPATHA SURECLICK) 532 MG/ML SOAJ Inject 140 mg into the skin every 14 (fourteen) days.  Marland Kitchen gabapentin (NEURONTIN) 300 MG capsule Take 1 capsule by mouth 3 (three) times daily as needed (nerve pain).   . hyoscyamine (LEVSIN SL) 0.125 MG SL tablet Place 1 tablet (0.125 mg total) under the tongue every 4 (four) hours as needed.  . isosorbide mononitrate (IMDUR) 60 MG 24 hr tablet Take 1 tablet (60 mg total) by mouth daily. HOLD until BP stable, see instructions  . ketoconazole (NIZORAL) 2 % cream Apply 1 fingertip amount to each foot daily.  Marland Kitchen levothyroxine (SYNTHROID, LEVOTHROID) 100 MCG tablet Take 100 mcg by mouth daily before breakfast.  . Multiple Vitamin (MULTIVITAMIN WITH MINERALS) TABS tablet Take 1 tablet by mouth daily.  . nitroGLYCERIN (NITROSTAT) 0.4 MG SL tablet DISSOLVE 1 TABLET UNDER THE TONGUE EVERY 5 MINUTES AS NEEDED FOR CHEST PAIN AS DIRECTED  . pantoprazole (PROTONIX) 40 MG tablet Take 1 tablet (40 mg total) by mouth daily.  . rosuvastatin (CRESTOR) 40 MG tablet Take 1 tablet (40 mg total) by  mouth daily.  . [DISCONTINUED] amLODipine (NORVASC) 10 MG tablet Take 1 tablet (10 mg total) by mouth daily. HOLD until BP stable, see instructions  . [DISCONTINUED] calcitRIOL (ROCALTROL) 0.25 MCG capsule TAKE 3 CAPSULES(0.75 MCG) BY MOUTH DAILY  . [DISCONTINUED] carvedilol (COREG) 3.125 MG tablet Take 1 tablet (3.125 mg total) by mouth 2 (two) times daily.  . [DISCONTINUED] clopidogrel (PLAVIX) 75 MG tablet Take 1 tablet (75 mg total) by mouth daily.  . [DISCONTINUED] pantoprazole (PROTONIX) 40 MG tablet Take 1 tablet (40 mg total) by mouth daily.  . [DISCONTINUED] rosuvastatin (CRESTOR) 40 MG tablet Take 1 tablet (40 mg total) by mouth daily.     Allergies:   Zetia [ezetimibe]   Past  Medical History:  Diagnosis Date  . Acute MI, subendocardial ([place])   . Back pain 05/31/2013  . Benign paroxysmal positional vertigo 08/05/2016  . CAD (coronary artery disease)    a. NSTEMI 10/2011: [place] 11/19/11: pLAD 30%, oOM 60%, AVCFX 30%, CFX after OM2 30%, pRCA 60 and 70%, then 99%, AM 80-90% with TIMI 3 flow.  PCI: Promus DES x 2 to RCA; b. 06/2012 Cath: patent RCA stents w/ subtl occl of Acute Marginal (jailed)->Med rx; c. 05/2015 MV: EF 59%, mod mid infsept/inf/ap lat/ap infarct with peri-infarct isch-->Med Rx; d. 02/2016 Cath: LM nl, LAD 16m, RI 50, RCA patent stents.  . Colitis   . Facial skin lesion 01/28/2017  . GERD (gastroesophageal reflux disease)    occasional  . Hyperlipidemia   . Hypertension   . Hypertensive heart disease    a. Echocardiogram 11/19/11: Difficult acoustic windows, EF 60-65%, normal LV wall thickness, grade 1 diastolic dysfunction  . Hypoparathyroidism ([place])   . Low zinc level 11/26/2016  . Multinodular thyroid    Goiter s/p thyroidectomy in 2007 with post-op  hypocalcemia and post-op hypothyroidism/hypoparathyroidism with hypocalcemia  . Neck pain on right side 07/13/2013  . Nocturia 05/31/2013  . Pain in right axilla 03/13/2016  . Palpitations    a. 03/2012 - patient set up for event monitor but did not wear correctly -  she declined wearing a repeat monitor  . Post-surgical hypothyroidism   . Sun-damaged skin 12/05/2014  . Tubular adenoma of colon 06/2011  . Unspecified constipation 05/31/2013  . Vertigo   . Zinc deficiency 11/26/2015    Past Surgical History:  Procedure Laterality Date  . CARDIAC CATHETERIZATION N/A 03/08/2016   Procedure: Left Heart Cath and Coronary Angiography;  Surgeon: Jettie Booze, MD;  Location: [facility];  Service: Cardiovascular;  Laterality: N/A;  . COLONOSCOPY  Aenomatous polyps   07/05/2011  . COLONOSCOPY N/A 09/28/2014   Procedure: COLONOSCOPY;  Surgeon: Ladene Artist, MD;  Location: [facility];  Service:  Endoscopy;  Laterality: N/A;  . CORONARY ANGIOPLASTY WITH STENT PLACEMENT    . LEFT HEART CATHETERIZATION WITH CORONARY ANGIOGRAM N/A 07/17/2012   Procedure: LEFT HEART CATHETERIZATION WITH CORONARY ANGIOGRAM;  Surgeon: Hillary Bow, MD;  Location: [facility] CATH LAB;  Service: Cardiovascular;  Laterality: N/A;  . PERCUTANEOUS CORONARY STENT INTERVENTION (PCI-S) N/A 11/19/2011   Procedure: PERCUTANEOUS CORONARY STENT INTERVENTION (PCI-S);  Surgeon: Hillary Bow, MD;  Location: [facility] CATH LAB;  Service: Cardiovascular;  Laterality: N/A;  . SHOULDER ARTHROSCOPY W/ ROTATOR CUFF REPAIR     right  . TOTAL THYROIDECTOMY  2007   GOITER     Social History:  The patient  reports that she has never smoked. She has never used smokeless tobacco. She reports that she does  not drink alcohol or use drugs.   Family History:  The patient's family history includes Blindness in her sister; Cancer in her brother and brother; Colon cancer in her brother; Congestive Heart Failure in her sister; Heart attack in her father; Heart disease in her father; Hypertension in her father and sister; Thyroid disease in her sister.    ROS:  Please see the history of present illness. All other systems are reviewed and  Negative to the above problem except as noted.    PHYSICAL EXAM: VS:  BP 132/68   Pulse 69   Ht 5\' 4"  (1.626 m)   Wt 89.4 kg (197 lb)   SpO2 96%   BMI 33.81 kg/m   GEN: Well nourished, well developed, in no acute distress  HEENT: normal  Neck:  JVP is normal No bruits   Cardiac: RRR; no murmurs, rubs, or gallops,no edema     Tr PT pulses   Respiratory:  clear to auscultation bilaterally, normal work of breathing GI: soft, nontender, nondistended, + BS   MS: no deformity Moving all extremities   Skin: warm and dry, no rash Neuro:  Strength and sensation are intact Psych: euthymic mood, full affect   EKG:  EKG is not ordered today.   Lipid Panel    Component Value Date/Time   CHOL 79 02/17/2018  1514   CHOL 88 (L) 02/10/2018 0859   TRIG 153.0 (H) 02/17/2018 1514   HDL 40.50 02/17/2018 1514   HDL 41 02/10/2018 0859   CHOLHDL 2 02/17/2018 1514   VLDL 30.6 02/17/2018 1514   LDLCALC 8 02/17/2018 1514   LDLCALC 24 02/10/2018 0859   LDLDIRECT 83.8 08/11/2012 1203      Wt Readings from Last 3 Encounters:  05/20/18 89.4 kg (197 lb)  04/07/18 88.5 kg (195 lb 3.2 oz)  04/04/18 88.5 kg (195 lb)      ASSESSMENT AND PLAN:  1  CAD I do not think current pinching sensations represent angina.   I would keep on current regimen    2.  Hx GERD  Pt asymptomatic  3  HL On repatha  REviewed recent labs with M SUpple   WIll get direct LDL to confirm    4  CV dz  Conitnue on statin    6  PVOD  Denies symptoms   COntinue statin   F/U later this year  SHe is going to [place] this summer   Very long trip   She will ambulate.   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     Current medicines are reviewed at length with the patient today.  The patient does not have concerns regarding medicines.  Signed, Dorris Carnes, MD  05/20/2018 11:57 AM    [address], [place], [place]  [address] Phone: [phone]; Fax: [phone]

## 2018-05-20 NOTE — Patient Instructions (Signed)
Your physician recommends that you continue on your current medications as directed. Please refer to the Current Medication list given to you today.  Your physician recommends that you return for lab work in: TODAY (bmet, cbc, lipid panel with direct LDL)

## 2018-05-21 LAB — CBC
Hematocrit: 39.6 % (ref 34.0–46.6)
Hemoglobin: 13 g/dL (ref 11.1–15.9)
MCH: 27.7 pg (ref 26.6–33.0)
MCHC: 32.8 g/dL (ref 31.5–35.7)
MCV: 84 fL (ref 79–97)
PLATELETS: 268 10*3/uL (ref 150–450)
RBC: 4.7 x10E6/uL (ref 3.77–5.28)
RDW: 15.8 % — AB (ref 12.3–15.4)
WBC: 10 10*3/uL (ref 3.4–10.8)

## 2018-05-21 LAB — BASIC METABOLIC PANEL
BUN/Creatinine Ratio: 21 (ref 12–28)
BUN: 22 mg/dL (ref 8–27)
CHLORIDE: 102 mmol/L (ref 96–106)
CO2: 26 mmol/L (ref 20–29)
Calcium: 8.3 mg/dL — ABNORMAL LOW (ref 8.7–10.3)
Creatinine, Ser: 1.03 mg/dL — ABNORMAL HIGH (ref 0.57–1.00)
GFR calc non Af Amer: 55 mL/min/{1.73_m2} — ABNORMAL LOW (ref >59)
GFR, EST AFRICAN AMERICAN: 63 mL/min/{1.73_m2} (ref >59)
Glucose: 79 mg/dL (ref 65–99)
POTASSIUM: 4 mmol/L (ref 3.5–5.2)
Sodium: 144 mmol/L (ref 134–144)

## 2018-05-21 LAB — LIPID PANEL
CHOLESTEROL TOTAL: 97 mg/dL — AB (ref 100–199)
Chol/HDL Ratio: 2.2 ratio (ref 0.0–4.4)
HDL: 45 mg/dL (ref >39)
LDL Calculated: 7 mg/dL (ref 0–99)
Triglycerides: 226 mg/dL — ABNORMAL HIGH (ref 0–149)
VLDL CHOLESTEROL CAL: 45 mg/dL — AB (ref 5–40)

## 2018-05-21 LAB — LDL CHOLESTEROL, DIRECT: LDL DIRECT: 26 mg/dL (ref 0–99)

## 2018-05-22 ENCOUNTER — Ambulatory Visit: Payer: Medicare Other | Admitting: Podiatry

## 2018-05-23 ENCOUNTER — Ambulatory Visit (INDEPENDENT_AMBULATORY_CARE_PROVIDER_SITE_OTHER): Payer: Medicare Other | Admitting: Podiatry

## 2018-05-23 ENCOUNTER — Encounter: Payer: Self-pay | Admitting: Podiatry

## 2018-05-23 DIAGNOSIS — L989 Disorder of the skin and subcutaneous tissue, unspecified: Secondary | ICD-10-CM | POA: Diagnosis not present

## 2018-05-23 NOTE — Progress Notes (Signed)
  Subjective:  Patient ID: [name], adult    DOB: Nov 29, 1946,  MRN: [id number]  Chief Complaint  Patient presents with  . Routine Post Op    biopsy follow up   72 y.o. adult returns for the above complaint.  Here for follow-up of biopsy of the left leg  Objective:  There were no vitals filed for this visit. General AA&O x3. Normal mood and affect.  Vascular Pedal pulses palpable.  Neurologic Epicritic sensation grossly intact.  Dermatologic  left leg biopsy site healed well with overlying scab  Orthopedic: No pain to palpation either foot.   Assessment & Plan:  Patient was evaluated and treated and all questions answered.  Status post left leg biopsy -Healing well.  No results today available for review.  We will follow-up results with patient  Return if symptoms worsen or fail to improve.

## 2018-05-26 ENCOUNTER — Other Ambulatory Visit: Payer: Self-pay | Admitting: Family Medicine

## 2018-05-27 ENCOUNTER — Telehealth: Payer: Self-pay | Admitting: Podiatry

## 2018-05-27 NOTE — Telephone Encounter (Signed)
Called and informed of path results.

## 2018-05-29 ENCOUNTER — Encounter: Payer: Self-pay | Admitting: *Deleted

## 2018-05-29 ENCOUNTER — Encounter: Payer: Self-pay | Admitting: Internal Medicine

## 2018-06-01 ENCOUNTER — Other Ambulatory Visit: Payer: Self-pay | Admitting: Family Medicine

## 2018-06-07 ENCOUNTER — Other Ambulatory Visit: Payer: Self-pay | Admitting: Family Medicine

## 2018-06-07 DIAGNOSIS — R079 Chest pain, unspecified: Secondary | ICD-10-CM

## 2018-06-16 ENCOUNTER — Ambulatory Visit: Payer: Medicare Other | Admitting: Family Medicine

## 2018-07-10 ENCOUNTER — Ambulatory Visit: Payer: Medicare Other | Admitting: Podiatry

## 2018-07-11 ENCOUNTER — Encounter: Payer: Self-pay | Admitting: Family Medicine

## 2018-09-03 NOTE — Progress Notes (Signed)
Subjective:  Patient ID: [name], adult    DOB: Sep 27, 1946,  MRN: [id number]  Chief Complaint  Patient presents with  . Nail Problem    right 2nd toenail might be ingrown. bilateral thick nails  . Callouses    bilateral great toes    72 y.o. adult presents  for diabetic foot care. Last AMBS was unknown. On Plavix. Reports calluses to both great toes. Denies numbness and tingling in their feet. Denies cramping in legs and thighs.  Review of Systems: Negative except as noted in the HPI. Denies N/V/F/Ch.  Past Medical History:  Diagnosis Date  . Acute MI, subendocardial ([place])   . Back pain 05/31/2013  . Benign paroxysmal positional vertigo 08/05/2016  . CAD (coronary artery disease)    a. NSTEMI 10/2011: [place] 11/19/11: pLAD 30%, oOM 60%, AVCFX 30%, CFX after OM2 30%, pRCA 60 and 70%, then 99%, AM 80-90% with TIMI 3 flow.  PCI: Promus DES x 2 to RCA; b. 06/2012 Cath: patent RCA stents w/ subtl occl of Acute Marginal (jailed)->Med rx; c. 05/2015 MV: EF 59%, mod mid infsept/inf/ap lat/ap infarct with peri-infarct isch-->Med Rx; d. 02/2016 Cath: LM nl, LAD 80m, RI 50, RCA patent stents.  . Colitis   . Facial skin lesion 01/28/2017  . GERD (gastroesophageal reflux disease)    occasional  . Hyperlipidemia   . Hypertension   . Hypertensive heart disease    a. Echocardiogram 11/19/11: Difficult acoustic windows, EF 60-65%, normal LV wall thickness, grade 1 diastolic dysfunction  . Hypoparathyroidism ([place])   . Low zinc level 11/26/2016  . Multinodular thyroid    Goiter s/p thyroidectomy in 2007 with post-op  hypocalcemia and post-op hypothyroidism/hypoparathyroidism with hypocalcemia  . Neck pain on right side 07/13/2013  . Nocturia 05/31/2013  . Pain in right axilla 03/13/2016  . Palpitations    a. 03/2012 - patient set up for event monitor but did not wear correctly -  she declined wearing a repeat monitor  . Post-surgical hypothyroidism   . Sun-damaged skin 12/05/2014  . Tubular adenoma  of colon 06/2011  . Unspecified constipation 05/31/2013  . Vertigo   . Zinc deficiency 11/26/2015    Current Outpatient Medications:  .  ALPRAZolam (XANAX) 0.25 MG tablet, Take 1 tablet (0.25 mg total) by mouth 2 (two) times daily as needed for anxiety., Disp: 5 tablet, Rfl: 0 .  amLODipine (NORVASC) 10 MG tablet, Take 1 tablet (10 mg total) by mouth daily. HOLD until BP stable, see instructions, Disp: 90 tablet, Rfl: 3 .  aspirin EC 81 MG tablet, Take 81 mg by mouth daily.  , Disp: , Rfl:  .  B Complex-C (B-COMPLEX WITH VITAMIN C) tablet, Take 1 tablet by mouth daily., Disp: , Rfl:  .  bisoprolol (ZEBETA) 5 MG tablet, bisoprolol fumarate 5 mg tablet, Disp: , Rfl:  .  calcitRIOL (ROCALTROL) 0.25 MCG capsule, TAKE 3 CAPSULES(0.75 MCG) BY MOUTH DAILY, Disp: 270 capsule, Rfl: 3 .  calcium carbonate (TUMS - DOSED IN MG ELEMENTAL CALCIUM) 500 MG chewable tablet, Chew 4 tablets by mouth 2 (two) times daily. , Disp: , Rfl:  .  carvedilol (COREG) 3.125 MG tablet, Take 1 tablet (3.125 mg total) by mouth 2 (two) times daily., Disp: 180 tablet, Rfl: 3 .  cefpodoxime (VANTIN) 200 MG tablet, cefpodoxime 200 mg tablet, Disp: , Rfl:  .  cholecalciferol (VITAMIN D) 1000 UNITS tablet, Take 1,000 Units by mouth daily., Disp: , Rfl:  .  clopidogrel (PLAVIX) 75 MG tablet, Take  1 tablet (75 mg total) by mouth daily., Disp: 90 tablet, Rfl: 3 .  Evolocumab (REPATHA SURECLICK) 471 MG/ML SOAJ, Inject 140 mg into the skin every 14 (fourteen) days., Disp: 6 pen, Rfl: 3 .  gabapentin (NEURONTIN) 300 MG capsule, Take 1 capsule by mouth 3 (three) times daily as needed (nerve pain). , Disp: , Rfl: 0 .  hyoscyamine (LEVSIN SL) 0.125 MG SL tablet, Place 1 tablet (0.125 mg total) under the tongue every 4 (four) hours as needed., Disp: 30 tablet, Rfl: 2 .  isosorbide mononitrate (IMDUR) 60 MG 24 hr tablet, Take 1 tablet (60 mg total) by mouth daily. HOLD until BP stable, see instructions, Disp: , Rfl:  .  ketoconazole (NIZORAL) 2 %  cream, Apply 1 fingertip amount to each foot daily., Disp: 30 g, Rfl: 0 .  levothyroxine (SYNTHROID, LEVOTHROID) 100 MCG tablet, Take 100 mcg by mouth daily before breakfast., Disp: , Rfl:  .  levothyroxine (SYNTHROID, LEVOTHROID) 100 MCG tablet, TAKE 1 TABLET(100 MCG) BY MOUTH DAILY, Disp: 90 tablet, Rfl: 0 .  Multiple Vitamin (MULTIVITAMIN WITH MINERALS) TABS tablet, Take 1 tablet by mouth daily., Disp: , Rfl:  .  nitroGLYCERIN (NITROSTAT) 0.4 MG SL tablet, DISSOLVE 1 TABLET UNDER THE TONGUE EVERY 5 MINUTES AS NEEDED FOR CHEST PAIN AS DIRECTED, Disp: 75 tablet, Rfl: 0 .  pantoprazole (PROTONIX) 40 MG tablet, Take 1 tablet (40 mg total) by mouth daily., Disp: 90 tablet, Rfl: 3 .  rosuvastatin (CRESTOR) 40 MG tablet, Take 1 tablet (40 mg total) by mouth daily., Disp: 90 tablet, Rfl: 3  Social History   Tobacco Use  Smoking Status Never Smoker  Smokeless Tobacco Never Used    Allergies  Allergen Reactions  . Zetia [Ezetimibe] Other (See Comments)    Myalgia, paresthesias and weakness   Objective:   General AA&O x3. Normal mood and affect.  Vascular Dorsalis pedis pulses present 1+ bilaterally  Posterior tibial pulses absent bilaterally  Capillary refill normal to all digits. Pedal hair growth normal.  Neurologic Epicritic sensation present bilaterally. Protective sensation with 5.07 monofilament  present bilaterally. Vibratory sensation present bilaterally.  Dermatologic No open lesions. Interspaces clear of maceration.  Normal skin temperature and turgor. Hyperkeratotic lesions: None bilaterally. Nails: brittle, onychomycosis, thickening, elongation L leg nevus like lesion with variegated color.  Orthopedic: No history of amputation. MMT 5/5 in dorsiflexion, plantarflexion, inversion, and eversion. Normal lower extremity joint ROM without pain or crepitus.    Assessment:   1. Encounter for diabetic foot exam ([place])   2. Diabetes mellitus type 2 with peripheral artery disease  ([place])   3. Coagulation defect ([place])   4. Benign skin lesion    Plan:  Patient was evaluated and treated and all questions answered.  Diabetes with Coagulation Defect, Onychomycosis -Educated on diabetic footcare. Diabetic risk level 0 -Nails x10 debrided sharply and manually with large nail nipper and rotary burr.   Procedure: Nail Debridement Rationale: Patient meets criteria for routine foot care due to coag defect. Type of Debridement: manual, sharp debridement. Instrumentation: Nail nipper, rotary burr. Number of Nails: 10   L Leg Lesion -Would benefit from biopsy. Will perform next visit.  Return in about 1 week (around 05/08/2018) for L Leg Biopsy.

## 2018-10-02 ENCOUNTER — Telehealth: Payer: Self-pay | Admitting: *Deleted

## 2018-10-02 NOTE — Telephone Encounter (Signed)
Called patient to follow up on her husband reporting to me that she had episode of chest pain.  Pt returned Tues night-midnight from extended trip out of the country.  Wed she felt chest pain lasted about 5-6 min then she took one NTG which was effective.  She was doing nothing when it started.  Felt little short of breath at the time.  No symptoms since then.  Advised her to rest today, be sure to get enough to drink, could have been tired, dehydrated from long travel. She has been scheduled with Dr. Harrington Challenger on 10/07/18. Advised that if CP returns, not relieved by NTG- she would need to go to ER for urgent eval. She voices understanding.

## 2018-10-07 ENCOUNTER — Ambulatory Visit (INDEPENDENT_AMBULATORY_CARE_PROVIDER_SITE_OTHER): Payer: Medicare Other | Admitting: Internal Medicine

## 2018-10-07 ENCOUNTER — Encounter: Payer: Self-pay | Admitting: Internal Medicine

## 2018-10-07 VITALS — BP 130/80 | HR 77 | Ht 64.0 in | Wt 197.1 lb

## 2018-10-07 DIAGNOSIS — I2510 Atherosclerotic heart disease of native coronary artery without angina pectoris: Secondary | ICD-10-CM

## 2018-10-07 DIAGNOSIS — E782 Mixed hyperlipidemia: Secondary | ICD-10-CM | POA: Diagnosis not present

## 2018-10-07 DIAGNOSIS — I10 Essential (primary) hypertension: Secondary | ICD-10-CM

## 2018-10-07 DIAGNOSIS — E876 Hypokalemia: Secondary | ICD-10-CM

## 2018-10-07 DIAGNOSIS — E039 Hypothyroidism, unspecified: Secondary | ICD-10-CM | POA: Diagnosis not present

## 2018-10-07 MED ORDER — ISOSORBIDE MONONITRATE ER 60 MG PO TB24: 60.0000 mg | ORAL_TABLET | Freq: Every day | ORAL | 3 refills | Status: DC

## 2018-10-07 MED ORDER — BISOPROLOL FUMARATE 5 MG PO TABS: 5.0000 mg | ORAL_TABLET | Freq: Every day | ORAL | 3 refills | Status: DC

## 2018-10-07 MED ORDER — AMLODIPINE BESYLATE 10 MG PO TABS: 10.0000 mg | ORAL_TABLET | Freq: Every day | ORAL | 3 refills | Status: DC

## 2018-10-07 MED ORDER — BISOPROLOL FUMARATE 5 MG PO TABS: ORAL_TABLET | ORAL | 3 refills | Status: DC

## 2018-10-07 NOTE — Progress Notes (Addendum)
Cardiology Office Note   Date:  10/07/2018   ID:  [name], DOB [birth year], MRN [id number]  PCP:  Mosie Lukes, MD  Cardiologist:   Dorris Carnes, MD    F/U of CAD     History of Present Illness: [name] is a 72 y.o. adult with a history of CAD - s/p NSTEMI 10/2011 with DESx2 to RCA and normal EF. She underwent repeat LHC on 07/17/12 Patent RCA stent LCA with collaterals to R acute marginal were unchanged. Subtotal occlusion of acute marginal (exits mid stent). Recomm for medical Rx. Other issues include thyroidectomy, GERD, HTN, HLD, vertigo.Last myoviue showed inferolateral scar with minimal periinfarct ischemia   She was sick while in [place] last summer 2018  .   Had CP  R/O for MI  EKGs negative  Seen by Cardiology  Recm she increase b blockade  Add bisoprolol I  Since I saw her in clinic lasat spring she has done well from a cardiac standpoint   Just got back from a prolonged vist to [place]     Only episode of CP occurred after she landed back in [place]   Went to bed   Woke up and devellped chest pressure   Relieved with 2 NTG    She has some R calf pain  Lateral  Did says she fell while she was in [place]  Current Meds  Medication Sig  . ALPRAZolam (XANAX) 0.25 MG tablet Take 1 tablet (0.25 mg total) by mouth 2 (two) times daily as needed for anxiety.  Marland Kitchen amLODipine (NORVASC) 10 MG tablet Take 1 tablet (10 mg total) by mouth daily. HOLD until BP stable, see instructions  . aspirin EC 81 MG tablet Take 81 mg by mouth daily.    . B Complex-C (B-COMPLEX WITH VITAMIN C) tablet Take 1 tablet by mouth daily.  . bisoprolol (ZEBETA) 5 MG tablet Take 5 mg by mouth daily.   . calcitRIOL (ROCALTROL) 0.25 MCG capsule TAKE 3 CAPSULES(0.75 MCG) BY MOUTH DAILY  . calcium carbonate (TUMS - DOSED IN MG ELEMENTAL CALCIUM) 500 MG chewable tablet Chew 4 tablets by mouth 2 (two) times daily.   . carvedilol (COREG) 3.125 MG tablet Take 1 tablet (3.125 mg total) by mouth 2 (two) times  daily.  . cefpodoxime (VANTIN) 200 MG tablet cefpodoxime 200 mg tablet  . cholecalciferol (VITAMIN D) 1000 UNITS tablet Take 1,000 Units by mouth daily.  . clopidogrel (PLAVIX) 75 MG tablet Take 1 tablet (75 mg total) by mouth daily.  . Evolocumab (REPATHA SURECLICK) 258 MG/ML SOAJ Inject 140 mg into the skin every 14 (fourteen) days.  Marland Kitchen gabapentin (NEURONTIN) 300 MG capsule Take 1 capsule by mouth 3 (three) times daily as needed (nerve pain).   . hyoscyamine (LEVSIN SL) 0.125 MG SL tablet Place 1 tablet (0.125 mg total) under the tongue every 4 (four) hours as needed.  . isosorbide mononitrate (IMDUR) 60 MG 24 hr tablet Take 1 tablet (60 mg total) by mouth daily. HOLD until BP stable, see instructions  . ketoconazole (NIZORAL) 2 % cream Apply 1 fingertip amount to each foot daily.  Marland Kitchen levothyroxine (SYNTHROID, LEVOTHROID) 100 MCG tablet TAKE 1 TABLET(100 MCG) BY MOUTH DAILY  . Multiple Vitamin (MULTIVITAMIN WITH MINERALS) TABS tablet Take 1 tablet by mouth daily.  . nitroGLYCERIN (NITROSTAT) 0.4 MG SL tablet DISSOLVE 1 TABLET UNDER THE TONGUE EVERY 5 MINUTES AS NEEDED FOR CHEST PAIN AS DIRECTED  . pantoprazole (PROTONIX) 40 MG tablet Take 1  tablet (40 mg total) by mouth daily.  . rosuvastatin (CRESTOR) 40 MG tablet Take 1 tablet (40 mg total) by mouth daily.     Allergies:   Zetia [ezetimibe]   Past Medical History:  Diagnosis Date  . Acute MI, subendocardial ([place])   . Back pain 05/31/2013  . Benign paroxysmal positional vertigo 08/05/2016  . CAD (coronary artery disease)    a. NSTEMI 10/2011: [place] 11/19/11: pLAD 30%, oOM 60%, AVCFX 30%, CFX after OM2 30%, pRCA 60 and 70%, then 99%, AM 80-90% with TIMI 3 flow.  PCI: Promus DES x 2 to RCA; b. 06/2012 Cath: patent RCA stents w/ subtl occl of Acute Marginal (jailed)->Med rx; c. 05/2015 MV: EF 59%, mod mid infsept/inf/ap lat/ap infarct with peri-infarct isch-->Med Rx; d. 02/2016 Cath: LM nl, LAD 91m, RI 50, RCA patent stents.  . Colitis   . Facial  skin lesion 01/28/2017  . GERD (gastroesophageal reflux disease)    occasional  . Hyperlipidemia   . Hypertension   . Hypertensive heart disease    a. Echocardiogram 11/19/11: Difficult acoustic windows, EF 60-65%, normal LV wall thickness, grade 1 diastolic dysfunction  . Hypoparathyroidism ([place])   . Low zinc level 11/26/2016  . Multinodular thyroid    Goiter s/p thyroidectomy in 2007 with post-op  hypocalcemia and post-op hypothyroidism/hypoparathyroidism with hypocalcemia  . Neck pain on right side 07/13/2013  . Nocturia 05/31/2013  . Pain in right axilla 03/13/2016  . Palpitations    a. 03/2012 - patient set up for event monitor but did not wear correctly -  she declined wearing a repeat monitor  . Post-surgical hypothyroidism   . Sun-damaged skin 12/05/2014  . Tubular adenoma of colon 06/2011  . Unspecified constipation 05/31/2013  . Vertigo   . Zinc deficiency 11/26/2015    Past Surgical History:  Procedure Laterality Date  . CARDIAC CATHETERIZATION N/A 03/08/2016   Procedure: Left Heart Cath and Coronary Angiography;  Surgeon: Jettie Booze, MD;  Location: [facility];  Service: Cardiovascular;  Laterality: N/A;  . COLONOSCOPY  Aenomatous polyps   07/05/2011  . COLONOSCOPY N/A 09/28/2014   Procedure: COLONOSCOPY;  Surgeon: Ladene Artist, MD;  Location: [facility];  Service: Endoscopy;  Laterality: N/A;  . CORONARY ANGIOPLASTY WITH STENT PLACEMENT    . LEFT HEART CATHETERIZATION WITH CORONARY ANGIOGRAM N/A 07/17/2012   Procedure: LEFT HEART CATHETERIZATION WITH CORONARY ANGIOGRAM;  Surgeon: Hillary Bow, MD;  Location: [facility] LAB;  Service: Cardiovascular;  Laterality: N/A;  . PERCUTANEOUS CORONARY STENT INTERVENTION (PCI-S) N/A 11/19/2011   Procedure: PERCUTANEOUS CORONARY STENT INTERVENTION (PCI-S);  Surgeon: Hillary Bow, MD;  Location: [facility] CATH LAB;  Service: Cardiovascular;  Laterality: N/A;  . SHOULDER ARTHROSCOPY W/ ROTATOR CUFF REPAIR     right  . TOTAL  THYROIDECTOMY  2007   GOITER     Social History:  The patient  reports that she has never smoked. She has never used smokeless tobacco. She reports that she does not drink alcohol or use drugs.   Family History:  The patient's family history includes Blindness in her sister; Cancer in her brother and brother; Colon cancer in her brother; Congestive Heart Failure in her sister; Heart attack in her father; Heart disease in her father; Hypertension in her father and sister; Thyroid disease in her sister.    ROS:  Please see the history of present illness. All other systems are reviewed and  Negative to the above problem except as noted.  PHYSICAL EXAM: VS:  BP 130/80   Pulse 77   Ht 5\' 4"  (1.626 m)   Wt 197 lb 1.9 oz (89.4 kg)   SpO2 94%   BMI 33.84 kg/m   GEN: Well nourished, well developed, in no acute distress  HEENT: normal  Neck:  JVP is normal No bruits   Cardiac: RRR; no murmurs, rubs, or gallops,no edema     1+PT pulses   Respiratory:  clear to auscultation bilaterally, normal work of breathing GI: soft, nontender, nondistended, + BS   MS: no deformity Moving all extremities   Skin: warm and dry, no rash Neuro:  Strength and sensation are intact Psych: euthymic mood, full affect   EKG:  EKG is not ordered today.   Lipid Panel    Component Value Date/Time   CHOL 97 (L) 05/20/2018 1251   TRIG 226 (H) 05/20/2018 1251   HDL 45 05/20/2018 1251   CHOLHDL 2.2 05/20/2018 1251   CHOLHDL 2 02/17/2018 1514   VLDL 30.6 02/17/2018 1514   LDLCALC 7 05/20/2018 1251   LDLDIRECT 26 05/20/2018 1251   LDLDIRECT 83.8 08/11/2012 1203      Wt Readings from Last 3 Encounters:  10/07/18 197 lb 1.9 oz (89.4 kg)  05/20/18 197 lb (89.4 kg)  04/07/18 195 lb 3.2 oz (88.5 kg)      ASSESSMENT AND PLAN:  1  CAD One spell of CP   Otherwise doing good    Continue to follow    2.  Hx GERD  Pt asymptomatic  3  HL On repatha  WIll repeat lipids     4  CV dz  Conitnue on statin     6  PVOD  I do not think pain on R calf represents claudicatoin   Laterall    At rest   After fall   Recomm Z SMith   Current medicines are reviewed at length with the patient today.  The patient does not have concerns regarding medicines.  Signed, Dorris Carnes, MD  10/07/2018 1:37 PM    [facility] [address], [place], [place]  [address] Phone: [phone]; Fax: [phone]

## 2018-10-07 NOTE — Patient Instructions (Signed)
Medication Instructions:  Your physician recommends that you continue on your current medications as directed. Please refer to the Current Medication list given to you today.  If you need a refill on your cardiac medications before your next appointment, please call your pharmacy.   Lab work: Today (CBC, BMET, LIPIDS) If you have labs (blood work) drawn today and your tests are completely normal, you will receive your results only by: Marland Kitchen MyChart Message (if you have MyChart) OR . A paper copy in the mail If you have any lab test that is abnormal or we need to change your treatment, we will call you to review the results.  Testing/Procedures: NONE  Follow-Up: At [facility], you and your health needs are our priority.  As part of our continuing mission to provide you with exceptional heart care, we have created designated Provider Care Teams.  These Care Teams include your primary Cardiologist (physician) and Advanced Practice Providers (APPs -  Physician Assistants and Nurse Practitioners) who all work together to provide you with the care you need, when you need it. You will need a follow up appointment in:  6 months.  Please call our office 2 months in advance to schedule this appointment.  You may see Dorris Carnes, MD or one of the following Advanced Practice Providers on your designated Care Team: Richardson Dopp, PA-C [place], [place] . Daune Perch, NP  Any Other Special Instructions Will Be Listed Below (If Applicable). NONE

## 2018-10-08 LAB — CBC
HEMOGLOBIN: 14.1 g/dL (ref 11.1–15.9)
Hematocrit: 42.1 % (ref 34.0–46.6)
MCH: 28.4 pg (ref 26.6–33.0)
MCHC: 33.5 g/dL (ref 31.5–35.7)
MCV: 85 fL (ref 79–97)
PLATELETS: 259 10*3/uL (ref 150–450)
RBC: 4.97 x10E6/uL (ref 3.77–5.28)
RDW: 14.9 % (ref 12.3–15.4)
WBC: 10.3 10*3/uL (ref 3.4–10.8)

## 2018-10-08 LAB — BASIC METABOLIC PANEL
BUN / CREAT RATIO: 22 (ref 12–28)
BUN: 24 mg/dL (ref 8–27)
CALCIUM: 9.3 mg/dL (ref 8.7–10.3)
CHLORIDE: 101 mmol/L (ref 96–106)
CO2: 26 mmol/L (ref 20–29)
CREATININE: 1.09 mg/dL — AB (ref 0.57–1.00)
GFR calc Af Amer: 59 mL/min/{1.73_m2} — ABNORMAL LOW (ref >59)
GFR calc non Af Amer: 51 mL/min/{1.73_m2} — ABNORMAL LOW (ref >59)
GLUCOSE: 86 mg/dL (ref 65–99)
Potassium: 4.5 mmol/L (ref 3.5–5.2)
Sodium: 145 mmol/L — ABNORMAL HIGH (ref 134–144)

## 2018-10-08 LAB — LIPID PANEL
Chol/HDL Ratio: 2.1 ratio (ref 0.0–4.4)
Cholesterol, Total: 101 mg/dL (ref 100–199)
HDL: 48 mg/dL (ref >39)
LDL CALC: 24 mg/dL (ref 0–99)
TRIGLYCERIDES: 146 mg/dL (ref 0–149)
VLDL CHOLESTEROL CAL: 29 mg/dL (ref 5–40)

## 2018-10-09 ENCOUNTER — Ambulatory Visit (INDEPENDENT_AMBULATORY_CARE_PROVIDER_SITE_OTHER): Payer: Medicare Other | Admitting: Family Medicine

## 2018-10-09 VITALS — BP 134/63 | HR 66 | Temp 98.2°F | Resp 18 | Wt 198.6 lb

## 2018-10-09 DIAGNOSIS — E039 Hypothyroidism, unspecified: Secondary | ICD-10-CM

## 2018-10-09 DIAGNOSIS — M79604 Pain in right leg: Secondary | ICD-10-CM

## 2018-10-09 DIAGNOSIS — M545 Low back pain, unspecified: Secondary | ICD-10-CM

## 2018-10-09 DIAGNOSIS — Z1239 Encounter for other screening for malignant neoplasm of breast: Secondary | ICD-10-CM

## 2018-10-09 DIAGNOSIS — R7982 Elevated C-reactive protein (CRP): Secondary | ICD-10-CM | POA: Diagnosis not present

## 2018-10-09 DIAGNOSIS — M5416 Radiculopathy, lumbar region: Secondary | ICD-10-CM

## 2018-10-09 DIAGNOSIS — E782 Mixed hyperlipidemia: Secondary | ICD-10-CM

## 2018-10-09 LAB — C-REACTIVE PROTEIN: CRP: 1.9 mg/dL (ref 0.5–20.0)

## 2018-10-09 NOTE — Assessment & Plan Note (Signed)
Tolerating statin, encouraged heart healthy diet, avoid trans fats, minimize simple carbs and saturated fats. Increase exercise as tolerated 

## 2018-10-09 NOTE — Assessment & Plan Note (Signed)
Fell 2 months prior to the onset of the pain while [place] of the country she started having pain from knee to ankle, [place] for DVT and xrays were negative. Does have low back pain despite some steroid injections every 3 months at [facility] with Dr Nelva Bush. They help but pain returns. Check lumbar xray

## 2018-10-09 NOTE — Assessment & Plan Note (Signed)
Check xray and refer to sports medicine

## 2018-10-09 NOTE — Assessment & Plan Note (Signed)
Resolved on recent labs.

## 2018-10-09 NOTE — Assessment & Plan Note (Signed)
On Levothyroxine, continue to monitor 

## 2018-10-09 NOTE — Progress Notes (Signed)
Subjective:    Patient ID: [name], adult    DOB: 05-07-1946, 72 y.o.   MRN: [id number]  No chief complaint on file.   HPI Patient is in today for follow-up.  She is recently returned to the country after being out of the country in [place].  While she was out of the country she had increased pain in her right lower extremity went to the doctor and they ruled out a DVT and ran some simple blood work.  It is reviewed and all normal except her CRP was elevated at 14.  She denies any recent illness or trauma.  She has had no recent hospitalization.  She denies any other acute concerns at this visit. Denies CP/palp/SOB/HA/congestion/fevers/GI or GU c/o. Taking meds as prescribed  Past Medical History:  Diagnosis Date  . Acute MI, subendocardial ([place])   . Back pain 05/31/2013  . Benign paroxysmal positional vertigo 08/05/2016  . CAD (coronary artery disease)    a. NSTEMI 10/2011: [place] 11/19/11: pLAD 30%, oOM 60%, AVCFX 30%, CFX after OM2 30%, pRCA 60 and 70%, then 99%, AM 80-90% with TIMI 3 flow.  PCI: Promus DES x 2 to RCA; b. 06/2012 Cath: patent RCA stents w/ subtl occl of Acute Marginal (jailed)->Med rx; c. 05/2015 MV: EF 59%, mod mid infsept/inf/ap lat/ap infarct with peri-infarct isch-->Med Rx; d. 02/2016 Cath: LM nl, LAD 55m, RI 50, RCA patent stents.  . Colitis   . Facial skin lesion 01/28/2017  . GERD (gastroesophageal reflux disease)    occasional  . Hyperlipidemia   . Hypertension   . Hypertensive heart disease    a. Echocardiogram 11/19/11: Difficult acoustic windows, EF 60-65%, normal LV wall thickness, grade 1 diastolic dysfunction  . Hypoparathyroidism ([place])   . Low zinc level 11/26/2016  . Multinodular thyroid    Goiter s/p thyroidectomy in 2007 with post-op  hypocalcemia and post-op hypothyroidism/hypoparathyroidism with hypocalcemia  . Neck pain on right side 07/13/2013  . Nocturia 05/31/2013  . Pain in right axilla 03/13/2016  . Palpitations    a. 03/2012 - patient set up  for event monitor but did not wear correctly -  she declined wearing a repeat monitor  . Post-surgical hypothyroidism   . Sun-damaged skin 12/05/2014  . Tubular adenoma of colon 06/2011  . Unspecified constipation 05/31/2013  . Vertigo   . Zinc deficiency 11/26/2015    Past Surgical History:  Procedure Laterality Date  . CARDIAC CATHETERIZATION N/A 03/08/2016   Procedure: Left Heart Cath and Coronary Angiography;  Surgeon: Jettie Booze, MD;  Location: [facility];  Service: Cardiovascular;  Laterality: N/A;  . COLONOSCOPY  Aenomatous polyps   07/05/2011  . COLONOSCOPY N/A 09/28/2014   Procedure: COLONOSCOPY;  Surgeon: Ladene Artist, MD;  [facility]: [facility];  Service: Endoscopy;  Laterality: N/A;  . CORONARY ANGIOPLASTY WITH STENT PLACEMENT    . LEFT HEART CATHETERIZATION WITH CORONARY ANGIOGRAM N/A 07/17/2012   Procedure: LEFT HEART CATHETERIZATION WITH CORONARY ANGIOGRAM;  Surgeon: Hillary Bow, MD;  Location: [facility] CATH LAB;  Service: Cardiovascular;  Laterality: N/A;  . PERCUTANEOUS CORONARY STENT INTERVENTION (PCI-S) N/A 11/19/2011   Procedure: PERCUTANEOUS CORONARY STENT INTERVENTION (PCI-S);  Surgeon: Hillary Bow, MD;  Location: [facility] CATH LAB;  Service: Cardiovascular;  Laterality: N/A;  . SHOULDER ARTHROSCOPY W/ ROTATOR CUFF REPAIR     right  . TOTAL THYROIDECTOMY  2007   GOITER    Family History  Problem Relation Age of Onset  . Colon cancer  Brother   . Cancer Brother        COLON  . Hypertension Father   . Heart disease Father   . Heart attack Father   . Blindness Sister   . Congestive Heart Failure Sister   . Hypertension Sister   . Thyroid disease Sister   . Cancer Brother        multiple myelomas  . Diabetes Neg Hx   . Prostate cancer Neg Hx   . Breast cancer Neg Hx     Social History   Socioeconomic History  . Marital status: Married    Spouse name: Not on file  . Number of children: 4  . Years of education: 14  . Highest education  level: Not on file  Occupational History  . Occupation: Press photographer person at [employer]  . Financial resource strain: Not on file  . Food insecurity:    Worry: Not on file    Inability: Not on file  . Transportation needs:    Medical: Not on file    Non-medical: Not on file  Tobacco Use  . Smoking status: Never Smoker  . Smokeless tobacco: Never Used  Substance and Sexual Activity  . Alcohol use: No  . Drug use: No  . Sexual activity: Not Currently  Lifestyle  . Physical activity:    Days per week: Not on file    Minutes per session: Not on file  . Stress: Not on file  Relationships  . Social connections:    Talks on phone: Not on file    Gets together: Not on file    Attends religious service: Not on file    Active member of club or organization: Not on file    Attends meetings of clubs or organizations: Not on file    Relationship status: Not on file  . Intimate partner violence:    Fear of current or ex partner: Not on file    Emotionally abused: Not on file    Physically abused: Not on file    Forced sexual activity: Not on file  Other Topics Concern  . Not on file  Social History Narrative   Lives at home alone.  Her son lives near her.   Right-handed.   1 cup coffee per day.    Outpatient Medications Prior to Visit  Medication Sig Dispense Refill  . ALPRAZolam (XANAX) 0.25 MG tablet Take 1 tablet (0.25 mg total) by mouth 2 (two) times daily as needed for anxiety. 5 tablet 0  . amLODipine (NORVASC) 10 MG tablet Take 1 tablet (10 mg total) by mouth daily. 90 tablet 3  . aspirin EC 81 MG tablet Take 81 mg by mouth daily.      . B Complex-C (B-COMPLEX WITH VITAMIN C) tablet Take 1 tablet by mouth daily.    . bisoprolol (ZEBETA) 5 MG tablet Take 1 tablet (5 mg total) by mouth daily. 90 tablet 3  . calcitRIOL (ROCALTROL) 0.25 MCG capsule TAKE 3 CAPSULES(0.75 MCG) BY MOUTH DAILY 270 capsule 3  . calcium carbonate (TUMS - DOSED IN MG ELEMENTAL CALCIUM) 500 MG  chewable tablet Chew 4 tablets by mouth 2 (two) times daily.     . carvedilol (COREG) 3.125 MG tablet Take 1 tablet (3.125 mg total) by mouth 2 (two) times daily. 180 tablet 3  . cefpodoxime (VANTIN) 200 MG tablet cefpodoxime 200 mg tablet    . cholecalciferol (VITAMIN D) 1000 UNITS tablet Take 1,000 Units by mouth daily.    Marland Kitchen  clopidogrel (PLAVIX) 75 MG tablet Take 1 tablet (75 mg total) by mouth daily. 90 tablet 3  . Evolocumab (REPATHA SURECLICK) 235 MG/ML SOAJ Inject 140 mg into the skin every 14 (fourteen) days. 6 pen 3  . gabapentin (NEURONTIN) 300 MG capsule Take 1 capsule by mouth 3 (three) times daily as needed (nerve pain).   0  . hyoscyamine (LEVSIN SL) 0.125 MG SL tablet Place 1 tablet (0.125 mg total) under the tongue every 4 (four) hours as needed. 30 tablet 2  . isosorbide mononitrate (IMDUR) 60 MG 24 hr tablet Take 1 tablet (60 mg total) by mouth daily. 90 tablet 3  . ketoconazole (NIZORAL) 2 % cream Apply 1 fingertip amount to each foot daily. 30 g 0  . levothyroxine (SYNTHROID, LEVOTHROID) 100 MCG tablet TAKE 1 TABLET(100 MCG) BY MOUTH DAILY 90 tablet 0  . Multiple Vitamin (MULTIVITAMIN WITH MINERALS) TABS tablet Take 1 tablet by mouth daily.    . nitroGLYCERIN (NITROSTAT) 0.4 MG SL tablet DISSOLVE 1 TABLET UNDER THE TONGUE EVERY 5 MINUTES AS NEEDED FOR CHEST PAIN AS DIRECTED 75 tablet 0  . pantoprazole (PROTONIX) 40 MG tablet Take 1 tablet (40 mg total) by mouth daily. 90 tablet 3  . rosuvastatin (CRESTOR) 40 MG tablet Take 1 tablet (40 mg total) by mouth daily. 90 tablet 3   No facility-administered medications prior to visit.     Allergies  Allergen Reactions  . Zetia [Ezetimibe] Other (See Comments)    Myalgia, paresthesias and weakness    Review of Systems  Constitutional: Negative for fever and malaise/fatigue.  HENT: Negative for congestion.   Eyes: Negative for blurred vision.  Respiratory: Negative for shortness of breath.   Cardiovascular: Negative for chest  pain, palpitations and leg swelling.  Gastrointestinal: Negative for abdominal pain, blood in stool and nausea.  Genitourinary: Negative for dysuria and frequency.  Musculoskeletal: Positive for back pain and joint pain. Negative for falls.  Skin: Negative for rash.  Neurological: Negative for dizziness, loss of consciousness and headaches.  Endo/Heme/Allergies: Negative for environmental allergies.  Psychiatric/Behavioral: Negative for depression. The patient is not nervous/anxious.        Objective:    Physical Exam  Constitutional: She is oriented to person, place, and time. She appears well-developed and well-nourished. No distress.  HENT:  Head: Normocephalic and atraumatic.  Nose: Nose normal.  Eyes: Right eye exhibits no discharge. Left eye exhibits no discharge.  Neck: Normal range of motion. Neck supple.  Cardiovascular: Normal rate and regular rhythm.  No murmur heard. Pulmonary/Chest: Effort normal and breath sounds normal.  Abdominal: Soft. Bowel sounds are normal. There is no tenderness.  Musculoskeletal: She exhibits no edema.  Neurological: She is alert and oriented to person, place, and time.  Skin: Skin is warm and dry.  Psychiatric: She has a normal mood and affect.  Nursing note and vitals reviewed.   BP 134/63 (BP Location: Left Arm, Patient Position: Sitting, Cuff Size: Normal)   Pulse 66   Temp 98.2 F (36.8 C) (Oral)   Resp 18   Wt 198 lb 9.6 oz (90.1 kg)   SpO2 95%   BMI 34.09 kg/m  Wt Readings from Last 3 Encounters:  10/09/18 198 lb 9.6 oz (90.1 kg)  10/07/18 197 lb 1.9 oz (89.4 kg)  05/20/18 197 lb (89.4 kg)     Lab Results  Component Value Date   WBC 10.3 10/07/2018   HGB 14.1 10/07/2018   HCT 42.1 10/07/2018   PLT 259 10/07/2018  GLUCOSE 86 10/07/2018   CHOL 101 10/07/2018   TRIG 146 10/07/2018   HDL 48 10/07/2018   LDLDIRECT 26 05/20/2018   LDLCALC 24 10/07/2018   ALT 37 (H) 03/14/2018   AST 27 03/14/2018   NA 145 (H)  10/07/2018   K 4.5 10/07/2018   CL 101 10/07/2018   CREATININE 1.09 (H) 10/07/2018   BUN 24 10/07/2018   CO2 26 10/07/2018   TSH 0.159 (L) 03/06/2018   INR 0.86 03/07/2016   HGBA1C 5.9 05/07/2016    Lab Results  Component Value Date   TSH 0.159 (L) 03/06/2018   Lab Results  Component Value Date   WBC 10.3 10/07/2018   HGB 14.1 10/07/2018   HCT 42.1 10/07/2018   MCV 85 10/07/2018   PLT 259 10/07/2018   Lab Results  Component Value Date   NA 145 (H) 10/07/2018   K 4.5 10/07/2018   CO2 26 10/07/2018   GLUCOSE 86 10/07/2018   BUN 24 10/07/2018   CREATININE 1.09 (H) 10/07/2018   BILITOT 0.3 03/14/2018   ALKPHOS 83 03/14/2018   AST 27 03/14/2018   ALT 37 (H) 03/14/2018   PROT 7.8 03/14/2018   ALBUMIN 4.0 03/14/2018   CALCIUM 9.3 10/07/2018   ANIONGAP 11 03/08/2018   GFR 58.76 (L) 03/14/2018   Lab Results  Component Value Date   CHOL 101 10/07/2018   Lab Results  Component Value Date   HDL 48 10/07/2018   Lab Results  Component Value Date   LDLCALC 24 10/07/2018   Lab Results  Component Value Date   TRIG 146 10/07/2018   Lab Results  Component Value Date   CHOLHDL 2.1 10/07/2018   Lab Results  Component Value Date   HGBA1C 5.9 05/07/2016       Assessment & Plan:   Problem List Items Addressed This Visit    Hypothyroidism    On Levothyroxine, continue to monitor      Hyperlipidemia    Tolerating statin, encouraged heart healthy diet, avoid trans fats, minimize simple carbs and saturated fats. Increase exercise as tolerated      Hypocalcemia    Resolved on recent labs      Back pain   Relevant Orders   DG Lumbar Spine 2-3 Views   Ambulatory referral to Sports Medicine   Right lumbar radiculopathy    Check xray and refer to sports medicine      Lower extremity pain, lateral, right    Fell 2 months prior to the onset of the pain while [place] she started having pain from knee to ankle, [place] for DVT and xrays were negative. Does  have low back pain despite some steroid injections every 3 months at [facility] with Dr Nelva Bush. They help but pain returns. Check lumbar xray      Relevant Orders   DG Lumbar Spine 2-3 Views   Ambulatory referral to Sports Medicine    Other Visit Diagnoses    Elevated C-reactive protein (CRP)    -  Primary   Relevant Orders   C-reactive protein   Breast cancer screening       Relevant Orders   MM 3D SCREEN BREAST BILATERAL      I am having Stina T. Puello maintain her calcium carbonate, aspirin EC, B-complex with vitamin C, cholecalciferol, gabapentin, multivitamin with minerals, Evolocumab, ALPRAZolam, hyoscyamine, ketoconazole, cefpodoxime, calcitRIOL, carvedilol, clopidogrel, pantoprazole, rosuvastatin, levothyroxine, nitroGLYCERIN, amLODipine, isosorbide mononitrate, and bisoprolol.  No orders of the defined types were placed  in this encounter.    Penni Homans, MD

## 2018-10-09 NOTE — Patient Instructions (Addendum)
Ginger caps prior to medications if nausea is an issure For pain Lidocaine gel made by the companies [employer], [employer] or Sal[employer]  Cholesterol Cholesterol is a white, waxy, fat-like substance that is needed by the human body in small amounts. The liver makes all the cholesterol we need. Cholesterol is carried from the liver by the blood through the blood vessels. Deposits of cholesterol (plaques) may build up on blood vessel (artery) walls. Plaques make the arteries narrower and stiffer. Cholesterol plaques increase the risk for heart attack and stroke. You cannot feel your cholesterol level even if it is very high. The only way to know that it is high is to have a blood test. Once you know your cholesterol levels, you should keep a record of the test results. Work with your health care provider to keep your levels in the desired range. What do the results mean?  Total cholesterol is a rough measure of all the cholesterol in your blood.  LDL (low-density lipoprotein) is the "bad" cholesterol. This is the type that causes plaque to build up on the artery walls. You want this level to be low.  HDL (high-density lipoprotein) is the "good" cholesterol because it cleans the arteries and carries the LDL away. You want this level to be high.  Triglycerides are fat that the body can either burn for energy or store. High levels are closely linked to heart disease. What are the desired levels of cholesterol?  Total cholesterol below 200.  LDL below 100 for people who are at risk, below 70 for people at very high risk.  HDL above 40 is good. A level of 60 or higher is considered to be protective against heart disease.  Triglycerides below 150. How can I lower my cholesterol? Diet Follow your diet program as told by your health care provider.  Choose fish or white meat chicken and [place], roasted or baked. Limit fatty cuts of red meat, fried foods, and processed meats, such as sausage and lunch  meats.  Eat lots of fresh fruits and vegetables.  Choose whole grains, beans, pasta, potatoes, and cereals.  Choose olive oil, corn oil, or canola oil, and use only small amounts.  Avoid butter, mayonnaise, shortening, or palm kernel oils.  Avoid foods with trans fats.  Drink skim or nonfat milk and eat low-fat or nonfat yogurt and cheeses. Avoid whole milk, cream, ice cream, egg yolks, and full-fat cheeses.  Healthier desserts include angel food cake, ginger snaps, animal crackers, hard candy, popsicles, and low-fat or nonfat frozen yogurt. Avoid pastries, cakes, pies, and cookies.  Exercise  Follow your exercise program as told by your health care provider. A regular program: ? Helps to decrease LDL and raise HDL. ? Helps with weight control.  Do things that increase your activity level, such as gardening, walking, and taking the stairs.  Ask your health care provider about ways that you can be more active in your daily life.  Medicine  Take over-the-counter and prescription medicines only as told by your health care provider. ? Medicine may be prescribed by your health care provider to help lower cholesterol and decrease the risk for heart disease. This is usually done if diet and exercise have failed to bring down cholesterol levels. ? If you have several risk factors, you may need medicine even if your levels are normal.  This information is not intended to replace advice given to you by your health care provider. Make sure you discuss any questions you  have with your health care provider. Document Released: 09/04/2001 Document Revised: 07/07/2016 Document Reviewed: 06/09/2016 Elsevier Interactive Patient Education  [employer].

## 2018-10-10 ENCOUNTER — Ambulatory Visit (HOSPITAL_BASED_OUTPATIENT_CLINIC_OR_DEPARTMENT_OTHER)
Admission: RE | Admit: 2018-10-10 | Discharge: 2018-10-10 | Disposition: A | Discharge status: Home / Self Care | Payer: Medicare Other | Source: Ambulatory Visit | Attending: Family Medicine | Admitting: Family Medicine

## 2018-10-10 DIAGNOSIS — M545 Low back pain, unspecified: Secondary | ICD-10-CM

## 2018-10-10 DIAGNOSIS — Z1231 Encounter for screening mammogram for malignant neoplasm of breast: Secondary | ICD-10-CM | POA: Insufficient documentation

## 2018-10-10 DIAGNOSIS — M79604 Pain in right leg: Secondary | ICD-10-CM

## 2018-10-10 DIAGNOSIS — Z1239 Encounter for other screening for malignant neoplasm of breast: Secondary | ICD-10-CM

## 2018-10-13 ENCOUNTER — Ambulatory Visit (INDEPENDENT_AMBULATORY_CARE_PROVIDER_SITE_OTHER): Payer: Medicare Other | Admitting: Family Medicine

## 2018-10-13 ENCOUNTER — Encounter: Payer: Self-pay | Admitting: Family Medicine

## 2018-10-13 ENCOUNTER — Other Ambulatory Visit: Payer: Self-pay | Admitting: Internal Medicine

## 2018-10-13 VITALS — BP 146/71 | HR 64 | Ht 65.0 in | Wt 194.0 lb

## 2018-10-13 DIAGNOSIS — M79604 Pain in right leg: Secondary | ICD-10-CM | POA: Diagnosis not present

## 2018-10-13 NOTE — Patient Instructions (Addendum)
You're getting spasms/strain in your tibialis anterior muscle of your lower leg. Compression sleeve or ACE wrap during the day when up and walking around. Topical voltaren gel up to 4 times a day. Can consider capsaicin, aspercreme topically also. Salon pas patches can help with pain. Heat 15 minutes at a time 3-4 times a day Start strengthening exercises 3 sets of 10 once a day - start with yellow theraband, advance to red. Consider walking boot, physical therapy, oral anti-inflammatory if not improving. Follow up with me in 4 weeks.

## 2018-10-13 NOTE — Progress Notes (Signed)
PCP and consultation requested by: Mosie Lukes, MD  Subjective:   HPI: Patient is a 71 y.o. adult here for right leg pain for 3 weeks.  Patient has 4-5/10 pain which is worse with ambulation.  She denies any specific injury, but mostly reports a fall on her right side back in June 2019.  No immediate leg pain related to that fall.  There seems to be no aggravating factors leading up to this pain.  She was initially evaluated by her PCP and was prescribed cortisone pills which she states helped after taking for 2 days.  She was unable to continue to complete the course of treatment due to GI upset.  She has not tried any other medications or ice for this.  Initially she reports swelling of the lateral aspect of the leg and pain with ankle movement.  These have resolved at this time.  No associated numbness/tingling.  She denies any pain in the ankle, knee, thigh, hip.  She has a history of chronic right low back pain with radiculopathy for which she receives routine epidural steroid injections.  Her back pain has been well controlled.  No bruising or erythema.  No associated skin change  Past Medical History:  Diagnosis Date  . Acute MI, subendocardial ([place])   . Back pain 05/31/2013  . Benign paroxysmal positional vertigo 08/05/2016  . CAD (coronary artery disease)    a. NSTEMI 10/2011: [place] 11/19/11: pLAD 30%, oOM 60%, AVCFX 30%, CFX after OM2 30%, pRCA 60 and 70%, then 99%, AM 80-90% with TIMI 3 flow.  PCI: Promus DES x 2 to RCA; b. 06/2012 Cath: patent RCA stents w/ subtl occl of Acute Marginal (jailed)->Med rx; c. 05/2015 MV: EF 59%, mod mid infsept/inf/ap lat/ap infarct with peri-infarct isch-->Med Rx; d. 02/2016 Cath: LM nl, LAD 61m, RI 50, RCA patent stents.  . Colitis   . Facial skin lesion 01/28/2017  . GERD (gastroesophageal reflux disease)    occasional  . Hyperlipidemia   . Hypertension   . Hypertensive heart disease    a. Echocardiogram 11/19/11: Difficult acoustic windows, EF 60-65%,  normal LV wall thickness, grade 1 diastolic dysfunction  . Hypoparathyroidism ([place])   . Low zinc level 11/26/2016  . Multinodular thyroid    Goiter s/p thyroidectomy in 2007 with post-op  hypocalcemia and post-op hypothyroidism/hypoparathyroidism with hypocalcemia  . Neck pain on right side 07/13/2013  . Nocturia 05/31/2013  . Pain in right axilla 03/13/2016  . Palpitations    a. 03/2012 - patient set up for event monitor but did not wear correctly -  she declined wearing a repeat monitor  . Post-surgical hypothyroidism   . Sun-damaged skin 12/05/2014  . Tubular adenoma of colon 06/2011  . Unspecified constipation 05/31/2013  . Vertigo   . Zinc deficiency 11/26/2015    Current Outpatient Medications on File Prior to Visit  Medication Sig Dispense Refill  . ALPRAZolam (XANAX) 0.25 MG tablet Take 1 tablet (0.25 mg total) by mouth 2 (two) times daily as needed for anxiety. 5 tablet 0  . amLODipine (NORVASC) 10 MG tablet Take 1 tablet (10 mg total) by mouth daily. 90 tablet 3  . aspirin EC 81 MG tablet Take 81 mg by mouth daily.      . B Complex-C (B-COMPLEX WITH VITAMIN C) tablet Take 1 tablet by mouth daily.    . bisoprolol (ZEBETA) 5 MG tablet Take 1 tablet (5 mg total) by mouth daily. 90 tablet 3  . calcitRIOL (ROCALTROL) 0.25 MCG  capsule TAKE 3 CAPSULES(0.75 MCG) BY MOUTH DAILY 270 capsule 3  . calcium carbonate (TUMS - DOSED IN MG ELEMENTAL CALCIUM) 500 MG chewable tablet Chew 4 tablets by mouth 2 (two) times daily.     . carvedilol (COREG) 3.125 MG tablet Take 1 tablet (3.125 mg total) by mouth 2 (two) times daily. 180 tablet 3  . cefpodoxime (VANTIN) 200 MG tablet cefpodoxime 200 mg tablet    . cholecalciferol (VITAMIN D) 1000 UNITS tablet Take 1,000 Units by mouth daily.    . clopidogrel (PLAVIX) 75 MG tablet Take 1 tablet (75 mg total) by mouth daily. 90 tablet 3  . Evolocumab (REPATHA SURECLICK) 078 MG/ML SOAJ Inject 140 mg into the skin every 14 (fourteen) days. 6 pen 3  . gabapentin  (NEURONTIN) 300 MG capsule Take 1 capsule by mouth 3 (three) times daily as needed (nerve pain).   0  . hyoscyamine (LEVSIN SL) 0.125 MG SL tablet Place 1 tablet (0.125 mg total) under the tongue every 4 (four) hours as needed. 30 tablet 2  . isosorbide mononitrate (IMDUR) 60 MG 24 hr tablet Take 1 tablet (60 mg total) by mouth daily. 90 tablet 3  . ketoconazole (NIZORAL) 2 % cream Apply 1 fingertip amount to each foot daily. 30 g 0  . levothyroxine (SYNTHROID, LEVOTHROID) 100 MCG tablet TAKE 1 TABLET(100 MCG) BY MOUTH DAILY 90 tablet 0  . Multiple Vitamin (MULTIVITAMIN WITH MINERALS) TABS tablet Take 1 tablet by mouth daily.    . nitroGLYCERIN (NITROSTAT) 0.4 MG SL tablet DISSOLVE 1 TABLET UNDER THE TONGUE EVERY 5 MINUTES AS NEEDED FOR CHEST PAIN AS DIRECTED 75 tablet 0  . pantoprazole (PROTONIX) 40 MG tablet Take 1 tablet (40 mg total) by mouth daily. 90 tablet 3  . rosuvastatin (CRESTOR) 40 MG tablet Take 1 tablet (40 mg total) by mouth daily. 90 tablet 3   No current facility-administered medications on file prior to visit.     Past Surgical History:  Procedure Laterality Date  . CARDIAC CATHETERIZATION N/A 03/08/2016   Procedure: Left Heart Cath and Coronary Angiography;  Surgeon: Jettie Booze, MD;  Location: [place] [facility];  Service: Cardiovascular;  Laterality: N/A;  . COLONOSCOPY  Aenomatous polyps   07/05/2011  . COLONOSCOPY N/A 09/28/2014   Procedure: COLONOSCOPY;  Surgeon: Ladene Artist, MD;  Location: [facility];  Service: Endoscopy;  Laterality: N/A;  . CORONARY ANGIOPLASTY WITH STENT PLACEMENT    . LEFT HEART CATHETERIZATION WITH CORONARY ANGIOGRAM N/A 07/17/2012   Procedure: LEFT HEART CATHETERIZATION WITH CORONARY ANGIOGRAM;  Surgeon: Hillary Bow, MD;  Location: [facility] C[facility] LAB;  Service: Cardiovascular;  Laterality: N/A;  . PERCUTANEOUS CORONARY STENT INTERVENTION (PCI-S) N/A 11/19/2011   Procedure: PERCUTANEOUS CORONARY STENT INTERVENTION (PCI-S);  Surgeon:  Hillary Bow, MD;  Location: [facility] CATH LAB;  Service: Cardiovascular;  Laterality: N/A;  . SHOULDER ARTHROSCOPY W/ ROTATOR CUFF REPAIR     right  . TOTAL THYROIDECTOMY  2007   [facility]    Allergies  Allergen Reactions  . Zetia [Ezetimibe] Other (See Comments)    Myalgia, paresthesias and weakness    Social History   Socioeconomic History  . Marital status: Married    Spouse name: Not on file  . Number of children: 4  . Years of education: 61  . Highest education level: Not on file  Occupational History  . Occupation: Press photographer person at [employer]  . Financial resource strain: Not on file  . Food insecurity:  Worry: Not on file    Inability: Not on file  . Transportation needs:    Medical: Not on file    Non-medical: Not on file  Tobacco Use  . Smoking status: Never Smoker  . Smokeless tobacco: Never Used  Substance and Sexual Activity  . Alcohol use: No  . Drug use: No  . Sexual activity: Not Currently  Lifestyle  . Physical activity:    Days per week: Not on file    Minutes per session: Not on file  . Stress: Not on file  Relationships  . Social connections:    Talks on phone: Not on file    Gets together: Not on file    Attends religious service: Not on file    Active member of club or organization: Not on file    Attends meetings of clubs or organizations: Not on file    Relationship status: Not on file  . Intimate partner violence:    Fear of current or ex partner: Not on file    Emotionally abused: Not on file    Physically abused: Not on file    Forced sexual activity: Not on file  Other Topics Concern  . Not on file  Social History Narrative   Lives at home alone.  Her son lives near her.   Right-handed.   1 cup coffee per day.    Family History  Problem Relation Age of Onset  . Colon cancer Brother   . Cancer Brother        COLON  . Hypertension Father   . Heart disease Father   . Heart attack Father   . Blindness Sister   .  Congestive Heart Failure Sister   . Hypertension Sister   . Thyroid disease Sister   . Cancer Brother        multiple myelomas  . Diabetes Neg Hx   . Prostate cancer Neg Hx   . Breast cancer Neg Hx     BP (!) 146/71   Pulse 64   Ht 5\' 5"  (1.651 m)   Wt 194 lb (88 kg)   BMI 32.28 kg/m   Review of Systems: See HPI above.     Objective:  Physical Exam:  Gen: awake, alert, NAD, comfortable in exam room Pulm: breathing unlabored  Right leg: No obvious deformity, swelling, erythema Tenderness over the proximal peroneal muscles in the area of the common peroneal nerve.  Tenderness also over the proximal half of the tibialis anterior.  No tenderness over the knee.  No tenderness over the ankle. Full range of motion of the knee and ankle. 5/5 strength with knee flexion and extension without pain.  5/5 strength in ankle plantarflexion, dorsiflexion, inversion and eversion without pain. N/V intact Negative Tinel's over the common peroneal nerve  Left leg: No deformity, swelling, erythema No tenderness over the lower extremity, knee, or ankle Full range of motion of the knee and ankle with 5/5 strength N/V intact   Assessment & Plan:  1.  Right leg pain secondary to tibialis anterior muscle strain/spasms.  Patient's symptoms do not seem to be related to her low back pain.  It is unlikely she is having a radiculopathy that affects only the lower dermatomal distribution and nothing above the knee.  Her pain is in the area of the common peroneal nerve, however she is having no numbness or tingling or weakness with dorsiflexion which make this an unlikely culprit along with the fact that she hasn't done  anything to compress or ice this part of the knee.  No tenderness over the anterior ankle makes tibialis anterior tendinitis unlikely. - Patient provided home exercises for strengthening - Patient will use her Voltaren gel up to 4 times daily as needed for pain.  Due to her being on Plavix,  we will attempt to avoid any oral NSAIDs - Recommend compression - Follow-up in 4 weeks

## 2018-10-17 ENCOUNTER — Other Ambulatory Visit: Payer: Self-pay | Admitting: Family

## 2018-10-17 ENCOUNTER — Ambulatory Visit (HOSPITAL_BASED_OUTPATIENT_CLINIC_OR_DEPARTMENT_OTHER)
Admission: RE | Admit: 2018-10-17 | Discharge: 2018-10-17 | Disposition: A | Discharge status: Home / Self Care | Payer: Medicare Other | Source: Ambulatory Visit | Attending: Family | Admitting: Family

## 2018-10-17 ENCOUNTER — Encounter: Payer: Self-pay | Admitting: Family

## 2018-10-17 ENCOUNTER — Ambulatory Visit (INDEPENDENT_AMBULATORY_CARE_PROVIDER_SITE_OTHER): Payer: Medicare Other | Admitting: Family

## 2018-10-17 VITALS — BP 140/78 | HR 63 | Temp 98.7°F | Resp 16 | Ht 65.0 in | Wt 198.0 lb

## 2018-10-17 DIAGNOSIS — M2540 Effusion, unspecified joint: Secondary | ICD-10-CM

## 2018-10-17 DIAGNOSIS — M79641 Pain in right hand: Secondary | ICD-10-CM | POA: Diagnosis not present

## 2018-10-17 DIAGNOSIS — M7989 Other specified soft tissue disorders: Secondary | ICD-10-CM | POA: Diagnosis not present

## 2018-10-17 LAB — CBC WITH DIFFERENTIAL/PLATELET
Basophils Absolute: 0.1 10*3/uL (ref 0.0–0.1)
Basophils Relative: 0.8 % (ref 0.0–3.0)
EOS PCT: 4.1 % (ref 0.0–5.0)
Eosinophils Absolute: 0.4 10*3/uL (ref 0.0–0.7)
HEMATOCRIT: 43 % (ref 36.0–46.0)
Hemoglobin: 14.5 g/dL (ref 12.0–15.0)
LYMPHS ABS: 2.4 10*3/uL (ref 0.7–4.0)
LYMPHS PCT: 27.4 % (ref 12.0–46.0)
MCHC: 33.8 g/dL (ref 30.0–36.0)
MCV: 85.1 fl (ref 78.0–100.0)
MONOS PCT: 8.8 % (ref 3.0–12.0)
Monocytes Absolute: 0.8 10*3/uL (ref 0.1–1.0)
NEUTROS ABS: 5.1 10*3/uL (ref 1.4–7.7)
Neutrophils Relative %: 58.9 % (ref 43.0–77.0)
Platelets: 258 10*3/uL (ref 150.0–400.0)
RBC: 5.05 Mil/uL (ref 3.87–5.11)
RDW: 15.8 % — ABNORMAL HIGH (ref 11.5–15.5)
WBC: 8.7 10*3/uL (ref 4.0–10.5)

## 2018-10-17 LAB — URIC ACID: Uric Acid, Serum: 4.6 mg/dL (ref 2.4–7.0)

## 2018-10-17 LAB — SEDIMENTATION RATE: Sed Rate: 21 mm/hr (ref 0–30)

## 2018-10-17 MED ORDER — TRAMADOL HCL 50 MG PO TABS: 50.0000 mg | ORAL_TABLET | Freq: Three times a day (TID) | ORAL | 0 refills | Status: DC | PRN

## 2018-10-17 NOTE — Patient Instructions (Addendum)
Please complete lab work prior to leaving. Complete x-ray on the first floor.  You may use tylenol as needed for minor pain and tramadol as needed for more severe pain. We will contact you with your results and further recommendations. Please call if increased pain/fever/swelling or if not improved in 1 week.

## 2018-10-17 NOTE — Telephone Encounter (Signed)
Results and xray reviewed with pt.  Neg so far. Await ANA, RA- further recs pending review of these studies.

## 2018-10-17 NOTE — Progress Notes (Signed)
;  o0

## 2018-10-17 NOTE — Progress Notes (Signed)
Subjective:    Patient ID: [name], adult    DOB: 09/12/1946, 72 y.o.   MRN: [id number]  HPI  Patient is a 72 yr old female who presents today with chief complaint of right sided hand pain.  Reports that hand pain began on Tuesday.  Denies known injury.   Started on Monday. Wednesday symptom worsened.  She denies personal or family history of gout.  Having trouble using this hand due to pain. She is right hand dominant.   Review of Systems    see HPI  Past Medical History:  Diagnosis Date  . Acute MI, subendocardial ([place])   . Back pain 05/31/2013  . Benign paroxysmal positional vertigo 08/05/2016  . CAD (coronary artery disease)    a. NSTEMI 10/2011: [place] 11/19/11: pLAD 30%, oOM 60%, AVCFX 30%, CFX after OM2 30%, pRCA 60 and 70%, then 99%, AM 80-90% with TIMI 3 flow.  PCI: Promus DES x 2 to RCA; b. 06/2012 Cath: patent RCA stents w/ subtl occl of Acute Marginal (jailed)->Med rx; c. 05/2015 MV: EF 59%, mod mid infsept/inf/ap lat/ap infarct with peri-infarct isch-->Med Rx; d. 02/2016 Cath: LM nl, LAD 28m RI 50, RCA patent stents.  . Colitis   . Facial skin lesion 01/28/2017  . GERD (gastroesophageal reflux disease)    occasional  . Hyperlipidemia   . Hypertension   . Hypertensive heart disease    a. Echocardiogram 11/19/11: Difficult acoustic windows, EF 60-65%, normal LV wall thickness, grade 1 diastolic dysfunction  . [place]opar[place]roidism [place]   . Low zinc level 11/26/2016  . Multinodular thyroid    Goiter s/p thyroidectomy in 2007 with post-op  hypocalcemia and post-op hypothyroidism/hypoparathyroidism with hypocalcemia  . Neck pain on right side 07/13/2013  . Nocturia 05/31/2013  . Pain in right axilla 03/13/2016  . Palpitations    a. 03/2012 - patient set up for event monitor but did not wear correctly -  she declined wearing a repeat monitor  . Post-surgical hypothyroidism   . Sun-damaged skin 12/05/2014  . Tubular adenoma of colon 06/2011  . Unspecified constipation 05/31/2013    . Vertigo   . Zinc deficiency 11/26/2015     Social History   Socioeconomic History  . Marital status: Married    Spouse name: Not on file  . Number of children: 4  . Years of education: 173 . Highest education level: Not on file  Occupational History  . Occupation: SPress photographerperson at [employer] . Financial resource strain: Not on file  . Food insecurity:    Worry: Not on file    Inability: Not on file  . Transportation needs:    Medical: Not on file    Non-medical: Not on file  Tobacco Use  . Smoking status: Never Smoker  . Smokeless tobacco: Never Used  Substance and Sexual Activity  . Alcohol use: No  . Drug use: No  . Sexual activity: Not Currently  Lifestyle  . Physical activity:    Days per week: Not on file    Minutes per session: Not on file  . Stress: Not on file  Relationships  . Social connections:    Talks on phone: Not on file    Gets together: Not on file    Attends religious service: Not on file    Active member of club or organization: Not on file    Attends meetings of clubs or organizations: Not on file    Relationship status: Not on file  .  Intimate partner violence:    Fear of current or ex partner: Not on file    Emotionally abused: Not on file    Physically abused: Not on file    Forced sexual activity: Not on file  Other Topics Concern  . Not on file  Social History Narrative   Lives at home alone.  Her son lives near her.   Right-handed.   1 cup coffee per day.    Past Surgical History:  Procedure Laterality Date  . CARDIAC CATHETERIZATION N/A 03/08/2016   Procedure: Left Heart Cath and Coronary Angiography;  Surgeon: Jettie Booze, MD;  Location: [place] [facility];  Service: Cardiovascular;  Laterality: N/A;  . COLONOSCOPY  Aenomatous polyps   07/05/2011  . COLONOSCOPY N/A 09/28/2014   Procedure: COLONOSCOPY;  Surgeon: Ladene Artist, MD;  Location: [facility];  Service: Endoscopy;  Laterality: N/A;  . CORONARY  ANGIOPLASTY WITH STENT PLACEMENT    . LEFT HEART CATHETERIZATION WITH CORONARY ANGIOGRAM N/A 07/17/2012   Procedure: LEFT HEART CATHETERIZATION WITH CORONARY ANGIOGRAM;  Surgeon: Hillary Bow, MD;  Location: [facility] CATH LAB;  Service: Cardiovascular;  Laterality: N/A;  . PERCUTANEOUS CORONARY STENT INTERVENTION (PCI-S) N/A 11/19/2011   Procedure: PERCUTANEOUS CORONARY STENT INTERVENTION (PCI-S);  Surgeon: Hillary Bow, MD;  [facility] LAB;  Service: Cardiovascular;  Laterality: N/A;  . SHOULDER ARTHROSCOPY W/ ROTATOR CUFF REPAIR     right  . TOTAL THYROIDECTOMY  2007   [facility]    Family History  Problem Relation Age of Onset  . Colon cancer Brother   . Cancer Brother        COLON  . Hypertension Father   . Heart disease Father   . Heart attack Father   . Blindness Sister   . Congestive Heart Failure Sister   . Hypertension Sister   . Thyroid disease Sister   . Cancer Brother        multiple myelomas  . Diabetes Neg Hx   . Prostate cancer Neg Hx   . Breast cancer Neg Hx     Allergies  Allergen Reactions  . Zetia [Ezetimibe] Other (See Comments)    Myalgia, paresthesias and weakness    Current Outpatient Medications on File Prior to Visit  Medication Sig Dispense Refill  . ALPRAZolam (XANAX) 0.25 MG tablet Take 1 tablet (0.25 mg total) by mouth 2 (two) times daily as needed for anxiety. 5 tablet 0  . amLODipine (NORVASC) 10 MG tablet Take 1 tablet (10 mg total) by mouth daily. 90 tablet 3  . aspirin EC 81 MG tablet Take 81 mg by mouth daily.      . B Complex-C (B-COMPLEX WITH VITAMIN C) tablet Take 1 tablet by mouth daily.    . bisoprolol (ZEBETA) 5 MG tablet Take 1 tablet (5 mg total) by mouth daily. 90 tablet 3  . calcitRIOL (ROCALTROL) 0.25 MCG capsule TAKE 3 CAPSULES(0.75 MCG) BY MOUTH DAILY 270 capsule 3  . calcium carbonate (TUMS - DOSED IN MG ELEMENTAL CALCIUM) 500 MG chewable tablet Chew 4 tablets by mouth 2 (two) times daily.     . carvedilol (COREG) 3.125  MG tablet Take 1 tablet (3.125 mg total) by mouth 2 (two) times daily. 180 tablet 3  . cefpodoxime (VANTIN) 200 MG tablet cefpodoxime 200 mg tablet    . cholecalciferol (VITAMIN D) 1000 UNITS tablet Take 1,000 Units by mouth daily.    . clopidogrel (PLAVIX) 75 MG tablet Take 1 tablet (75 mg total)  by mouth daily. 90 tablet 3  . gabapentin (NEURONTIN) 300 MG capsule Take 1 capsule by mouth 3 (three) times daily as needed (nerve pain).   0  . hyoscyamine (LEVSIN SL) 0.125 MG SL tablet Place 1 tablet (0.125 mg total) under the tongue every 4 (four) hours as needed. 30 tablet 2  . isosorbide mononitrate (IMDUR) 60 MG 24 hr tablet Take 1 tablet (60 mg total) by mouth daily. 90 tablet 3  . ketoconazole (NIZORAL) 2 % cream Apply 1 fingertip amount to each foot daily. 30 g 0  . levothyroxine (SYNTHROID, LEVOTHROID) 100 MCG tablet TAKE 1 TABLET(100 MCG) BY MOUTH DAILY 90 tablet 0  . Multiple Vitamin (MULTIVITAMIN WITH MINERALS) TABS tablet Take 1 tablet by mouth daily.    . nitroGLYCERIN (NITROSTAT) 0.4 MG SL tablet DISSOLVE 1 TABLET UNDER THE TONGUE EVERY 5 MINUTES AS NEEDED FOR CHEST PAIN AS DIRECTED 75 tablet 0  . pantoprazole (PROTONIX) 40 MG tablet Take 1 tablet (40 mg total) by mouth daily. 90 tablet 3  . REPATHA SURECLICK 370 MG/ML SOAJ ADMINISTER 1 ML UNDER THE SKIN EVERY 14 DAYS 6 pen 1  . rosuvastatin (CRESTOR) 40 MG tablet Take 1 tablet (40 mg total) by mouth daily. 90 tablet 3   No current facility-administered medications on file prior to visit.     BP 140/78 (BP Location: Left Arm, Patient Position: Sitting, Cuff Size: Small)   Pulse 63   Temp 98.7 F (37.1 C) (Oral)   Resp 16   Ht _0  (1.651 m)   Wt 198 lb (89.8 kg)   SpO2 97%   BMI 32.95 kg/m    Objective:   Physical Exam  Constitutional: She is oriented to person, place, and time. She appears well-developed and well-nourished.  Cardiovascular: Normal rate, regular rhythm and normal heart sounds.  No murmur  heard. Pulmonary/Chest: Effort normal and breath sounds normal. No respiratory distress. She has no wheezes.  Musculoskeletal: She exhibits no edema.  + swelling/warmth erythema noted of right index and middle finger PIP   Neurological: She is alert and oriented to person, place, and time.  Psychiatric: She has a normal mood and affect. Her behavior is normal. Judgment and thought content normal.           Assessment & Plan:  Right hand pain- ? Fracture, versus inflammatory arthritis (RA/Gout or infection). Will check ANA, Rheumatoid factor, ESR, CBC.  Pt is advised as follows:  Please complete lab work prior to leaving. Complete x-ray on the first floor.  You may use tylenol as needed for minor pain and tramadol as needed for more severe pain. We will contact you with your results and further recommendations. Please call if increased pain/fever/swelling or if not improved in 1 week.

## 2018-10-19 NOTE — Telephone Encounter (Signed)
Please contact pt and ask her how her symptoms are.  Also, I would like to refer her to rheumatology for further evaluation.

## 2018-10-20 MED ORDER — METHYLPREDNISOLONE 4 MG PO TABS: ORAL_TABLET | ORAL | 0 refills | Status: DC

## 2018-10-20 NOTE — Telephone Encounter (Signed)
[name], please see pended rx.  Sig is too long to send electronically. Could you please call in to her pharmacy? Also, could you please contact pt to arrange follow up with Dr. Charlett Blake in 1 week or me if she is busy?    I have already spoken to the patient. She notes the the pain and swelling is better in the index/middle finger but now has pain/swelling of her 5th finger.  She is aware of her results and that we are sending an rx to her pharmacy.

## 2018-10-20 NOTE — Telephone Encounter (Signed)
-----   Message from Mosie Lukes, MD sent at 10/19/2018 12:37 PM EDT ----- Yes I think that is reasonable. I have been using a slow medrol taper with 3 days at each dose recently in these situations with good results. Shall I write or do have follow up planned already. Thanks ----- Message ----- From: Debbrah Alar, NP Sent: 10/18/2018   9:46 AM EDT To: Mosie Lukes, MD  Wonder if it would worth a round of prednisone since she is in such pain and it will take forever before she will get in with rheumatology.  Thanks ----- Message ----- From: Mosie Lukes, MD Sent: 10/17/2018  10:09 PM EDT To: Debbrah Alar, NP  I would have done the same. Would probably refer her to rheumatology even if labs negative due to the swelling and redness.  ----- Message ----- From: Debbrah Alar, NP Sent: 10/17/2018   4:57 PM EDT To: Mosie Lukes, MD  Could you please glance at my note and results? Any other recs?  I thought about cellulitis but it really looks like joint involvement and wbc is normal. Awaiting RA/ANA but sed rate is negative.

## 2018-10-20 NOTE — Telephone Encounter (Signed)
Rx called to pharmacist. Notified pt and scheduled follow up with Dr Charlett Blake on 10/27/18 at 11:15am.

## 2018-10-21 LAB — RHEUMATOID FACTOR

## 2018-10-21 LAB — ANA: Anti Nuclear Antibody(ANA): POSITIVE — AB

## 2018-10-21 LAB — ANTI-NUCLEAR AB-TITER (ANA TITER)

## 2018-10-24 ENCOUNTER — Telehealth: Payer: Self-pay | Admitting: Family Medicine

## 2018-10-24 NOTE — Telephone Encounter (Signed)
Copied from [facility] [id number]. Topic: Quick Communication - See Telephone Encounter >> Oct 24, 2018  2:36 PM Rosalin Hawking wrote: CRM for notification. See Telephone encounter for: 10/24/18.   Pt's sister dropped off document to be filled out provider (Disability Parking Placard - 1 page) Pt would like to be called at [phone]. Document put at front office tray under providers name.

## 2018-10-27 ENCOUNTER — Encounter: Payer: Self-pay | Admitting: Family Medicine

## 2018-10-27 ENCOUNTER — Ambulatory Visit (INDEPENDENT_AMBULATORY_CARE_PROVIDER_SITE_OTHER): Payer: Medicare Other | Admitting: Family Medicine

## 2018-10-27 DIAGNOSIS — R768 Other specified abnormal immunological findings in serum: Secondary | ICD-10-CM | POA: Diagnosis not present

## 2018-10-27 DIAGNOSIS — I10 Essential (primary) hypertension: Secondary | ICD-10-CM | POA: Diagnosis not present

## 2018-10-27 NOTE — Progress Notes (Signed)
Subjective:    Patient ID: [name], adult    DOB: 1946/11/14, 72 y.o.   MRN: [id number]  Chief Complaint  Patient presents with  . Hand Pain    Here for follow up    HPI Patient is in today for follow up. Her hands continue to hurt but less so each day. Swelling has improved. The onset had been sudden but now he pain improves some each day. No obvious febrile illness pio to onset. Denies CP/palp/SOB/HA/congestion/fevers/GI or GU c/o. Taking meds as prescribed  Past Medical History:  Diagnosis Date  . Acute MI, subendocardial ([place])   . Back pain 05/31/2013  . Benign paroxysmal positional vertigo 08/05/2016  . CAD (coronary artery disease)    a. NSTEMI 10/2011: [facility] 11/19/11: pLAD 30%, oOM 60%, AVCFX 30%, CFX after OM2 30%, pRCA 60 and 70%, then 99%, AM 80-90% with TIMI 3 flow.  PCI: Promus DES x 2 to RCA; b. 06/2012 Cath: patent RCA stents w/ subtl occl of Acute Marginal (jailed)->Med rx; c. 05/2015 MV: EF 59%, mod mid infsept/inf/ap lat/ap infarct with peri-infarct isch-->Med Rx; d. 02/2016 Cath: LM nl, LAD 20m, RI 50, RCA patent stents.  . Colitis   . Facial skin lesion 01/28/2017  . GERD (gastroesophageal reflux disease)    occasional  . Hyperlipidemia   . Hypertension   . Hypertensive heart disease    a. Echocardiogram 11/19/11: Difficult acoustic windows, EF 60-65%, normal LV wall thickness, grade 1 diastolic dysfunction  . Hypoparathyroidism ([place])   . Low zinc level 11/26/2016  . Multinodular thyroid    Goiter s/p thyroidectomy in 2007 with post-op  hypocalcemia and post-op hypothyroidism/hypoparathyroidism with hypocalcemia  . Neck pain on right side 07/13/2013  . Nocturia 05/31/2013  . Pain in right axilla 03/13/2016  . Palpitations    a. 03/2012 - patient set up for event monitor but did not wear correctly -  she declined wearing a repeat monitor  . Post-surgical hypothyroidism   . Sun-damaged skin 12/05/2014  . Tubular adenoma of colon 06/2011  . Unspecified constipation  05/31/2013  . Vertigo   . Zinc deficiency 11/26/2015    Past Surgical History:  Procedure Laterality Date  . CARDIAC CATHETERIZATION N/A 03/08/2016   Procedure: Left Heart Cath and Coronary Angiography;  Surgeon: Jettie Booze, MD;  Location: [facility];  Service: Cardiovascular;  Laterality: N/A;  . COLONOSCOPY  Aenomatous polyps   07/05/2011  . COLONOSCOPY N/A 09/28/2014   Procedure: COLONOSCOPY;  Surgeon: Ladene Artist, MD;  Location: [facility];  Service: Endoscopy;  Laterality: N/A;  . CORONARY ANGIOPLASTY WITH STENT PLACEMENT    . LEFT HEART CATHETERIZATION WITH CORONARY ANGIOGRAM N/A 07/17/2012   Procedure: LEFT HEART CATHETERIZATION WITH CORONARY ANGIOGRAM;  Surgeon: Hillary Bow, MD;  Location: [facility];  Service: Cardiovascular;  Laterality: N/A;  . PERCUTANEOUS CORONARY STENT INTERVENTION (PCI-S) N/A 11/19/2011   Procedure: PERCUTANEOUS CORONARY STENT INTERVENTION (PCI-S);  Surgeon: Hillary Bow, MD;  Location: [facility] CATH LAB;  Service: Cardiovascular;  Laterality: N/A;  . SHOULDER ARTHROSCOPY W/ ROTATOR CUFF REPAIR     right  . TOTAL THYROIDECTOMY  2007   [facility]    Family History  Problem Relation Age of Onset  . Colon cancer Brother   . Cancer Brother        COLON  . Hypertension Father   . Heart disease Father   . Heart attack Father   . Blindness Sister   . Congestive Heart Failure  Sister   . Hypertension Sister   . Thyroid disease Sister   . Cancer Brother        multiple myelomas  . Diabetes Neg Hx   . Prostate cancer Neg Hx   . Breast cancer Neg Hx     Social History   Socioeconomic History  . Marital status: Married    Spouse name: Not on file  . Number of children: 4  . Years of education: 40  . Highest education level: Not on file  Occupational History  . Occupation: Press photographer person at [employer]  . Financial resource strain: Not on file  . Food insecurity:    Worry: Not on file    Inability: Not on file  .  Transportation needs:    Medical: Not on file    Non-medical: Not on file  Tobacco Use  . Smoking status: Never Smoker  . Smokeless tobacco: Never Used  Substance and Sexual Activity  . Alcohol use: No  . Drug use: No  . Sexual activity: Not Currently  Lifestyle  . Physical activity:    Days per week: Not on file    Minutes per session: Not on file  . Stress: Not on file  Relationships  . Social connections:    Talks on phone: Not on file    Gets together: Not on file    Attends religious service: Not on file    Active member of club or organization: Not on file    Attends meetings of clubs or organizations: Not on file    Relationship status: Not on file  . Intimate partner violence:    Fear of current or ex partner: Not on file    Emotionally abused: Not on file    Physically abused: Not on file    Forced sexual activity: Not on file  Other Topics Concern  . Not on file  Social History Narrative   Lives at home alone.  Her son lives near her.   Right-handed.   1 cup coffee per day.    Outpatient Medications Prior to Visit  Medication Sig Dispense Refill  . ALPRAZolam (XANAX) 0.25 MG tablet Take 1 tablet (0.25 mg total) by mouth 2 (two) times daily as needed for anxiety. 5 tablet 0  . amLODipine (NORVASC) 10 MG tablet Take 1 tablet (10 mg total) by mouth daily. 90 tablet 3  . aspirin EC 81 MG tablet Take 81 mg by mouth daily.      . B Complex-C (B-COMPLEX WITH VITAMIN C) tablet Take 1 tablet by mouth daily.    . bisoprolol (ZEBETA) 5 MG tablet Take 1 tablet (5 mg total) by mouth daily. 90 tablet 3  . calcitRIOL (ROCALTROL) 0.25 MCG capsule TAKE 3 CAPSULES(0.75 MCG) BY MOUTH DAILY 270 capsule 3  . calcium carbonate (TUMS - DOSED IN MG ELEMENTAL CALCIUM) 500 MG chewable tablet Chew 4 tablets by mouth 2 (two) times daily.     . carvedilol (COREG) 3.125 MG tablet Take 1 tablet (3.125 mg total) by mouth 2 (two) times daily. 180 tablet 3  . cefpodoxime (VANTIN) 200 MG tablet  cefpodoxime 200 mg tablet    . cholecalciferol (VITAMIN D) 1000 UNITS tablet Take 1,000 Units by mouth daily.    . clopidogrel (PLAVIX) 75 MG tablet Take 1 tablet (75 mg total) by mouth daily. 90 tablet 3  . gabapentin (NEURONTIN) 300 MG capsule Take 1 capsule by mouth 3 (three) times daily as needed (nerve pain).  0  . hyoscyamine (LEVSIN SL) 0.125 MG SL tablet Place 1 tablet (0.125 mg total) under the tongue every 4 (four) hours as needed. 30 tablet 2  . isosorbide mononitrate (IMDUR) 60 MG 24 hr tablet Take 1 tablet (60 mg total) by mouth daily. 90 tablet 3  . ketoconazole (NIZORAL) 2 % cream Apply 1 fingertip amount to each foot daily. 30 g 0  . levothyroxine (SYNTHROID, LEVOTHROID) 100 MCG tablet TAKE 1 TABLET(100 MCG) BY MOUTH DAILY 90 tablet 0  . methylPREDNISolone (MEDROL) 4 MG tablet 6 tabs by mouth once daily for 3 days, then 5 tabs daily x 3 days, then 4 tabs daily x 3 days, then 3 tabs daily x 3 days, then 2 tabs daily x 3 days, then 2 tabs daily x 3 days, then 1 tab daily x 3 days 69 tablet 0  . Multiple Vitamin (MULTIVITAMIN WITH MINERALS) TABS tablet Take 1 tablet by mouth daily.    . nitroGLYCERIN (NITROSTAT) 0.4 MG SL tablet DISSOLVE 1 TABLET UNDER THE TONGUE EVERY 5 MINUTES AS NEEDED FOR CHEST PAIN AS DIRECTED 75 tablet 0  . pantoprazole (PROTONIX) 40 MG tablet Take 1 tablet (40 mg total) by mouth daily. 90 tablet 3  . REPATHA SURECLICK 106 MG/ML SOAJ ADMINISTER 1 ML UNDER THE SKIN EVERY 14 DAYS 6 pen 1  . rosuvastatin (CRESTOR) 40 MG tablet Take 1 tablet (40 mg total) by mouth daily. 90 tablet 3  . traMADol (ULTRAM) 50 MG tablet Take 1 tablet (50 mg total) by mouth every 8 (eight) hours as needed. 15 tablet 0   No facility-administered medications prior to visit.     Allergies  Allergen Reactions  . Zetia [Ezetimibe] Other (See Comments)    Myalgia, paresthesias and weakness    Review of Systems  Constitutional: Positive for malaise/fatigue. Negative for fever.  HENT:  Negative for congestion.   Eyes: Negative for blurred vision.  Respiratory: Negative for shortness of breath.   Cardiovascular: Negative for chest pain, palpitations and leg swelling.  Gastrointestinal: Negative for abdominal pain, blood in stool and nausea.  Genitourinary: Negative for dysuria and frequency.  Musculoskeletal: Positive for joint pain and myalgias. Negative for falls.  Skin: Negative for rash.  Neurological: Negative for dizziness, loss of consciousness and headaches.  Endo/Heme/Allergies: Negative for environmental allergies.  Psychiatric/Behavioral: Negative for depression. The patient is nervous/anxious.        Objective:    Physical Exam  Constitutional: She is oriented to person, place, and time. She appears well-developed and well-nourished. No distress.  HENT:  Head: Normocephalic and atraumatic.  Eyes: Conjunctivae are normal.  Neck: Neck supple. No thyromegaly present.  Cardiovascular: Normal rate, regular rhythm and normal heart sounds.  No murmur heard. Pulmonary/Chest: Effort normal and breath sounds normal. No respiratory distress.  Abdominal: Soft. Bowel sounds are normal. She exhibits no distension and no mass. There is no tenderness.  Musculoskeletal: She exhibits no edema.  Lymphadenopathy:    She has no cervical adenopathy.  Neurological: She is alert and oriented to person, place, and time.  Skin: Skin is warm and dry.  Psychiatric: She has a normal mood and affect. Her behavior is normal.    BP (!) 142/80 (BP Location: Left Arm, Patient Position: Sitting, Cuff Size: Large)   Pulse 76   Temp 98 F (36.7 C) (Oral)   Resp 16   Ht 5\' 5"  (1.651 m)   Wt 198 lb (89.8 kg)   SpO2 94%   BMI 32.95 kg/m  Wt Readings from Last 3 Encounters:  10/27/18 198 lb (89.8 kg)  10/17/18 198 lb (89.8 kg)  10/13/18 194 lb (88 kg)     Lab Results  Component Value Date   WBC 8.7 10/17/2018   HGB 14.5 10/17/2018   HCT 43.0 10/17/2018   PLT 258.0  10/17/2018   GLUCOSE 86 10/07/2018   CHOL 101 10/07/2018   TRIG 146 10/07/2018   HDL 48 10/07/2018   LDLDIRECT 26 05/20/2018   LDLCALC 24 10/07/2018   ALT 37 (H) 03/14/2018   AST 27 03/14/2018   NA 145 (H) 10/07/2018   K 4.5 10/07/2018   CL 101 10/07/2018   CREATININE 1.09 (H) 10/07/2018   BUN 24 10/07/2018   CO2 26 10/07/2018   TSH 0.159 (L) 03/06/2018   INR 0.86 03/07/2016   HGBA1C 5.9 05/07/2016    Lab Results  Component Value Date   TSH 0.159 (L) 03/06/2018   Lab Results  Component Value Date   WBC 8.7 10/17/2018   HGB 14.5 10/17/2018   HCT 43.0 10/17/2018   MCV 85.1 10/17/2018   PLT 258.0 10/17/2018   Lab Results  Component Value Date   NA 145 (H) 10/07/2018   K 4.5 10/07/2018   CO2 26 10/07/2018   GLUCOSE 86 10/07/2018   BUN 24 10/07/2018   CREATININE 1.09 (H) 10/07/2018   BILITOT 0.3 03/14/2018   ALKPHOS 83 03/14/2018   AST 27 03/14/2018   ALT 37 (H) 03/14/2018   PROT 7.8 03/14/2018   ALBUMIN 4.0 03/14/2018   CALCIUM 9.3 10/07/2018   ANIONGAP 11 03/08/2018   GFR 58.76 (L) 03/14/2018   Lab Results  Component Value Date   CHOL 101 10/07/2018   Lab Results  Component Value Date   HDL 48 10/07/2018   Lab Results  Component Value Date   LDLCALC 24 10/07/2018   Lab Results  Component Value Date   TRIG 146 10/07/2018   Lab Results  Component Value Date   CHOLHDL 2.1 10/07/2018   Lab Results  Component Value Date   HGBA1C 5.9 05/07/2016       Assessment & Plan:   Problem List Items Addressed This Visit    Essential hypertension, benign    Well controlled, no changes to meds. Encouraged heart healthy diet such as the DASH diet and exercise as tolerated.          I am having Beverlie T. Bohnsack maintain her calcium carbonate, aspirin EC, B-complex with vitamin C, cholecalciferol, gabapentin, multivitamin with minerals, ALPRAZolam, hyoscyamine, ketoconazole, cefpodoxime, calcitRIOL, carvedilol, clopidogrel, pantoprazole, rosuvastatin,  levothyroxine, nitroGLYCERIN, amLODipine, isosorbide mononitrate, bisoprolol, REPATHA SURECLICK, traMADol, and methylPREDNISolone.  No orders of the defined types were placed in this encounter.    Penni Homans, MD

## 2018-10-27 NOTE — Assessment & Plan Note (Signed)
Well controlled, no changes to meds. Encouraged heart healthy diet such as the DASH diet and exercise as tolerated.  °

## 2018-10-27 NOTE — Patient Instructions (Signed)
Antinuclear Antibody Test Why am I having this test? This is a test used to help diagnose systemic lupus erythematosus (SLE) and other autoimmune diseases. An autoimmune disease is a disease in which the body's own defense system (immune system) attacks the body. This test checks for antinuclear antibodies (ANA) in the blood. The presence of ANA is associated with several autoimmune diseases. It is most commonly seen with SLE. What kind of sample is taken? A blood sample is required for this test. It is usually collected by inserting a needle into a vein. How do I prepare for this test? There is no preparation required for this test. How are the results reported? Your test results will be reported as either positive or negative. It is your responsibility to obtain your test results. Ask the lab or department performing the test when and how you will get your results. What do the results mean? A positive test may mean you have:  SLE.  An autoimmune disease.  Liver dysfunction.  Leukemia.  Infectious mononucleosis.  Talk with your health care provider to discuss your results, treatment options, and if necessary, the need for more tests. Talk with your health care provider if you have any questions about your results. Talk with your health care provider to discuss your results, treatment options, and if necessary, the need for more tests. Talk with your health care provider if you have any questions about your results. This information is not intended to replace advice given to you by your health care provider. Make sure you discuss any questions you have with your health care provider. Document Released: 01/01/2005 Document Revised: 08/13/2016 Document Reviewed: 05/11/2014 Elsevier Interactive Patient Education  2018 [employer]  

## 2018-10-27 NOTE — Telephone Encounter (Signed)
Completed Physician section on Disability Parking Placard Application; forwarded to provider/SLS 11/04

## 2018-11-04 NOTE — Progress Notes (Signed)
Office Visit Note  Patient: [name]             Date of Birth: 1946/10/13           MRN: [id number]             PCP: Mosie Lukes, MD Referring: Debbrah Alar, NP Visit Date: 11/18/2018 Occupation: retired  Subjective:  Stiffness in hands.   History of Present Illness: [name] is a 72 y.o. adult seen in consultation per request of her PCP.  According to patient in September 2019 she developed some pain in her right lower extremity.  She states she went to the emergency room where she was told that she had fluid in her right knee and she was given oral prednisone.  She states she took it for a few days and then stopped it and the symptoms improved.  In October 2019 she developed pain and swelling in her right hand.  She states the pain was severe she was seen by her primary care physician who gave her prednisone which she took only for 5 days and stopped that she developed some GI discomfort.  She states the prednisone did help and she had no recurrence of swelling.  She also had some lab work at the time which showed positive ANA.  She states currently she is not having any joint swelling but she continues to have some stiffness in her hands.  None of the other joints are painful.  Activities of Daily Living:  Patient reports morning stiffness for 2 minutes.   Patient Denies nocturnal pain.  Difficulty dressing/grooming: Denies Difficulty climbing stairs: Denies Difficulty getting out of chair: Denies Difficulty using hands for taps, buttons, cutlery, and/or writing: Denies  Review of Systems  Constitutional: Negative for fatigue, night sweats, weight gain and weight loss.  HENT: Negative for mouth sores, trouble swallowing, trouble swallowing, mouth dryness and nose dryness.   Eyes: Positive for dryness. Negative for pain, redness and visual disturbance.  Respiratory: Negative for cough, shortness of breath and difficulty breathing.   Cardiovascular:  Positive for chest pain. Negative for palpitations, hypertension, irregular heartbeat and swelling in legs/feet.  Gastrointestinal: Negative for blood in stool, constipation and diarrhea.  Endocrine: Negative for increased urination.  Genitourinary: Negative for vaginal dryness.  Musculoskeletal: Positive for arthralgias, joint pain and morning stiffness. Negative for joint swelling, myalgias, muscle weakness, muscle tenderness and myalgias.  Skin: Negative for color change, rash, hair loss, skin tightness, ulcers and sensitivity to sunlight.  Allergic/Immunologic: Negative for susceptible to infections.  Neurological: Negative for dizziness, memory loss, night sweats and weakness.  Hematological: Negative for swollen glands.  Psychiatric/Behavioral: Negative for depressed mood and sleep disturbance. The patient is not nervous/anxious.     PMFS History:  Patient Active Problem List   Diagnosis Date Noted  . ANA positive 10/27/2018  . CAP (community acquired pneumonia) 03/06/2018  . Lower extremity pain, lateral, right 02/17/2018  . Post-menopausal bleeding 02/17/2018  . Cephalalgia 02/17/2018  . Anxiety 02/17/2018  . Peripheral arterial disease ([place]) 02/20/2017  . Facial skin lesion 01/28/2017  . Benign paroxysmal positional vertigo 08/05/2016  . Atypical chest pain 03/27/2016  . Esophageal reflux 03/27/2016  . Antiplatelet or antithrombotic long-term use 03/27/2016  . Pain in right axilla 03/13/2016  . Chest pain 03/07/2016  . Right lumbar radiculopathy 02/21/2016  . Abnormality of gait 02/21/2016  . Zinc deficiency 11/26/2015  . Chronic low back pain 10/26/2015  . Right hip pain 10/26/2015  .  Medicare annual wellness visit, subsequent 03/13/2015  . Preventative health care 03/13/2015  . Sun-damaged skin 12/05/2014  . Angina of effort ([place]) 12/02/2014  . Hx of colonic polyps 09/28/2014  . Benign neoplasm of descending colon 09/28/2014  . Other fatigue 09/10/2014  . Personal  history of colonic polyps 07/16/2014  . Bilateral hand pain 03/20/2014  . Postsurgical hypoparathyroidism ([place]) 08/03/2013  . Neck pain on right side 07/13/2013  . Constipation 05/31/2013  . Back pain 05/31/2013  . Nocturia 05/31/2013  . GERD (gastroesophageal reflux disease) 11/02/2012  . Tension headache 11/02/2012  . Anemia 06/11/2012  . Hypokalemia 06/11/2012  . Depression 01/21/2012  . Headache(784.0) 12/23/2011  . Bradycardia 12/07/2011  . Hypocalcemia 12/07/2011  . Acute MI, subendocardial ([place]) 11/20/2011  . Hypothyroidism 05/20/2009  . Essential hypertension, benign 05/20/2009  . Hyperlipidemia 03/08/2009  . CAD, NATIVE VESSEL 03/08/2009    Past Medical History:  Diagnosis Date  . Acute MI, subendocardial ([place])   . Back pain 05/31/2013  . Benign paroxysmal positional vertigo 08/05/2016  . CAD (coronary artery disease)    a. NSTEMI 10/2011: [facility] 11/19/11: pLAD 30%, oOM 60%, AVCFX 30%, CFX after OM2 30%, pRCA 60 and 70%, then 99%, AM 80-90% with TIMI 3 flow.  PCI: Promus DES x 2 to RCA; b. 06/2012 Cath: patent RCA stents w/ subtl occl of Acute Marginal (jailed)->Med rx; c. 05/2015 MV: EF 59%, mod mid infsept/inf/ap lat/ap infarct with peri-infarct isch-->Med Rx; d. 02/2016 Cath: LM nl, LAD 3m, RI 50, RCA patent stents.  . Colitis   . Facial skin lesion 01/28/2017  . GERD (gastroesophageal reflux disease)    occasional  . Hyperlipidemia   . Hypertension   . Hypertensive heart disease    a. Echocardiogram 11/19/11: Difficult acoustic windows, EF 60-65%, normal LV wall thickness, grade 1 diastolic dysfunction  . Hypoparathyroidism ([place])   . Low zinc level 11/26/2016  . Multinodular thyroid    Goiter s/p thyroidectomy in 2007 with post-op  hypocalcemia and post-op hypothyroidism/hypoparathyroidism with hypocalcemia  . Neck pain on right side 07/13/2013  . Nocturia 05/31/2013  . Pain in right axilla 03/13/2016  . Palpitations    a. 03/2012 - patient set up for event monitor but did  not wear correctly -  she declined wearing a repeat monitor  . Post-surgical hypothyroidism   . Sun-damaged skin 12/05/2014  . Tubular adenoma of colon 06/2011  . Unspecified constipation 05/31/2013  . Vertigo   . Zinc deficiency 11/26/2015    Family History  Problem Relation Age of Onset  . Colon cancer Brother   . Cancer Brother        COLON  . Hypertension Father   . Heart disease Father   . Heart attack Father   . Blindness Sister   . Congestive Heart Failure Sister   . Hypertension Sister   . Healthy Daughter   . Healthy Son   . Thyroid disease Sister   . Cancer Brother        multiple myelomas  . Healthy Daughter   . Healthy Daughter   . Diabetes Neg Hx   . Prostate cancer Neg Hx   . Breast cancer Neg Hx    Past Surgical History:  Procedure Laterality Date  . CARDIAC CATHETERIZATION N/A 03/08/2016   Procedure: Left Heart Cath and Coronary Angiography;  Surgeon: Jettie Booze, MD;  Location: [facility];  Service: Cardiovascular;  Laterality: N/A;  . COLONOSCOPY  Aenomatous polyps   07/05/2011  . COLONOSCOPY  N/A 09/28/2014   Procedure: COLONOSCOPY;  Surgeon: Ladene Artist, MD;  Location: [facility];  Service: Endoscopy;  Laterality: N/A;  . CORONARY ANGIOPLASTY WITH STENT PLACEMENT    . LEFT HEART CATHETERIZATION WITH CORONARY ANGIOGRAM N/A 07/17/2012   Procedure: LEFT HEART CATHETERIZATION WITH CORONARY ANGIOGRAM;  Surgeon: Hillary Bow, MD;  Location: [facility] CATH LAB;  Service: Cardiovascular;  Laterality: N/A;  . PERCUTANEOUS CORONARY STENT INTERVENTION (PCI-S) N/A 11/19/2011   Procedure: PERCUTANEOUS CORONARY STENT INTERVENTION (PCI-S);  Surgeon: Hillary Bow, MD;  Location: [facility] CATH LAB;  Service: Cardiovascular;  Laterality: N/A;  . SHOULDER ARTHROSCOPY W/ ROTATOR CUFF REPAIR     right  . TOTAL THYROIDECTOMY  2007   GOITER   Social History   Social History Narrative   Lives at home alone.  Her son lives near her.   Right-handed.   1 cup  coffee per day.    Objective: Vital Signs: BP (!) 150/79 (BP Location: Right Arm, Patient Position: Sitting, Cuff Size: Normal)   Pulse 73   Resp 14   Ht 5\' 4"  (1.626 m)   Wt 197 lb 9.6 oz (89.6 kg)   BMI 33.92 kg/m    Physical Exam  Constitutional: She is oriented to person, place, and time. She appears well-developed and well-nourished.  HENT:  Head: Normocephalic and atraumatic.  Eyes: Conjunctivae and EOM are normal.  Neck: Normal range of motion.  Cardiovascular: Normal rate, regular rhythm, normal heart sounds and intact distal pulses.  Pulmonary/Chest: Effort normal and breath sounds normal.  Abdominal: Soft. Bowel sounds are normal.  Lymphadenopathy:    She has no cervical adenopathy.  Neurological: She is alert and oriented to person, place, and time.  Skin: Skin is warm and dry. Capillary refill takes less than 2 seconds.  Psychiatric: She has a normal mood and affect. Her behavior is normal.  Nursing note and vitals reviewed.    Musculoskeletal Exam: On thoracic lumbar spine good range of motion.  Shoulder joints elbow joints wrist joints were in good range of motion.  She has DIP and PIP thickening.  No synovitis was noted over MCP joints.  Hip joints, knee joints, ankles, MTPs and PIPs been good range of motion with no synovitis.  CDAI Exam: CDAI Score: Not documented Patient Global Assessment: Not documented; Provider Global Assessment: Not documented Swollen: Not documented; Tender: Not documented Joint Exam   Not documented   There is currently no information documented on the homunculus. Go to the Rheumatology activity and complete the homunculus joint exam.  Investigation: Findings:  10/17/18: ANA 1:80 Nuclear, speckled, RF<14, Sed rate 21, uric acid 4.6  Component     Latest Ref Rng & Units 10/17/2018  ANA Titer 1     titer 1:80 (H)  ANA Pattern 1      Nuclear, Speckled (A)  Anti Nuclear Antibody(ANA)     NEGATIVE POSITIVE (A)  RA Latex  Turbid.     <14 IU/mL <14  Sed Rate     0 - 30 mm/hr 21  Uric Acid, Serum     2.4 - 7.0 mg/dL 4.6   Component     Latest Ref Rng & Units 10/07/2018 10/09/2018  Cholesterol, Total     100 - 199 mg/dL 101   Triglycerides     0 - 149 mg/dL 146   HDL Cholesterol     >39 mg/dL 48   VLDL Cholesterol Cal     5 - 40 mg/dL 29   LDL (  calc)     0 - 99 mg/dL 24   Total CHOL/HDL Ratio     0.0 - 4.4 ratio 2.1   CRP     0.5 - 20.0 mg/dL  1.9   Imaging: Dg Chest 2 View  Result Date: 11/17/2018 CLINICAL DATA:  Chest pain. Heaviness in chest beginning today without activity. EXAM: CHEST - 2 VIEW COMPARISON:  Two-view chest x-ray 04/30/2018 FINDINGS: The heart is mildly enlarged on the lateral view. Coronary artery calcifications are evident. Atherosclerotic calcifications are present in the aortic arch. Mild pulmonary vascular congestion is new. Areas of linear atelectasis are present at both bases. No significant effusion is present. No other airspace consolidation is present. Mild degenerative changes are present in the thoracic spine. IMPRESSION: 1. Mild cardiomegaly and pulmonary vascular congestion suggesting early congestive heart failure. 2. Aortic atherosclerosis. 3. Mild bibasilar airspace disease likely reflects atelectasis. Electronically Signed   By: San Morelle M.D.   On: 11/17/2018 18:31   Xr Knee 3 View Left  Result Date: 11/18/2018 Moderate medial compartment narrowing was noted.  Moderate patellofemoral narrowing was noted.  No chondrocalcinosis was noted. Impression: Moderate osteoarthritis and moderate chondromalacia patella.  Xr Knee 3 View Right  Result Date: 11/18/2018 Mild medial compartment narrowing was noted.  No chondrocalcinosis was noted.  Moderate patellofemoral narrowing was noted. Impression: Mild osteoarthritis and moderate chondromalacia patella.   Recent Labs: Lab Results  Component Value Date   WBC 8.9 11/17/2018   HGB 13.5 11/17/2018   PLT 253  11/17/2018   NA 139 11/17/2018   K 3.8 11/17/2018   CL 103 11/17/2018   CO2 25 11/17/2018   GLUCOSE 117 (H) 11/17/2018   BUN 22 11/17/2018   CREATININE 1.12 (H) 11/17/2018   BILITOT 0.3 03/14/2018   ALKPHOS 83 03/14/2018   AST 27 03/14/2018   ALT 37 (H) 03/14/2018   PROT 7.8 03/14/2018   ALBUMIN 4.0 03/14/2018   CALCIUM 7.9 (L) 11/17/2018   GFRAA 56 (L) 11/17/2018    Speciality Comments: No specialty comments available.  Procedures:  No procedures performed Allergies: Zetia [[email or web address]]   Assessment / Plan:     Visit Diagnoses: Bilateral hand pain -patient gives history of intermittent swelling in her right hand.  She states she had swelling in her right hand about a month ago she was treated with prednisone at the hospital for few days.  She had no synovitis on examination.  I will obtain some additional labs today.  10/17/18: ANA 1:80 Nuclear, speckled, RF<14, Sed rate 21, uric acid 4.6 - Plan: Cyclic citrul peptide antibody, IgG, 14-3-3 eta Protein  Chronic pain of both knees -she gives history of pain in her bilateral knee joints and recent fluid in her knee per patient.  She had no warmth swelling or effusion on examination today.  Plan: XR KNEE 3 VIEW RIGHT, XR KNEE 3 VIEW LEFT.  X-rays were consistent with moderate osteoarthritis and moderate chondromalacia patella.  DDD (degenerative disc disease), lumbar-she has history of lower back pain for many years.  She states she gets epidural injections frequently.  ANA positive -she has positive ANA but no other clinical features of autoimmune disease.  I will obtain following labs today.  Plan: COMPLETE METABOLIC PANEL WITH GFR, Urinalysis, Routine w reflex microscopic, Anti-scleroderma antibody, RNP Antibody, Anti-Smith antibody, Sjogrens syndrome-A extractable nuclear antibody, Sjogrens syndrome-B extractable nuclear antibody, Anti-DNA antibody, double-stranded, C3 and C4, Beta-2 glycoprotein antibodies, Cardiolipin antibodies, IgG,  IgM, IgA, Lupus Anticoagulant Eval w/Reflex  She has  multiple medical problems which are described below:  Peripheral arterial disease ([place])  Essential hypertension, benign  History of MI (myocardial infarction)  History of coronary artery disease  History of hypothyroidism  History of gastroesophageal reflux (GERD)  History of hyperlipidemia  Anxiety   Orders: Orders Placed This Encounter  Procedures  . XR KNEE 3 VIEW RIGHT  . XR KNEE 3 VIEW LEFT  . COMPLETE METABOLIC PANEL WITH GFR  . Urinalysis, Routine w reflex microscopic  . Cyclic citrul peptide antibody, IgG  . 14-3-3 eta Protein  . Anti-scleroderma antibody  . RNP Antibody  . Anti-Smith antibody  . Sjogrens syndrome-A extractable nuclear antibody  . Sjogrens syndrome-B extractable nuclear antibody  . Anti-DNA antibody, double-stranded  . C3 and C4  . Beta-2 glycoprotein antibodies  . Cardiolipin antibodies, IgG, IgM, IgA  . Lupus Anticoagulant Eval w/Reflex   No orders of the defined types were placed in this encounter.   Face-to-face time spent with patient was 50 minutes. Greater than 50% of time was spent in counseling and coordination of care.  Follow-Up Instructions: Return for Inflammatory arthritis.   Bo Merino, MD  Note - This record has been created using Editor, commissioning.  Chart creation errors have been sought, but may not always  have been located. Such creation errors do not reflect on  the standard of medical care.

## 2018-11-10 ENCOUNTER — Ambulatory Visit: Payer: Medicare Other | Admitting: Family Medicine

## 2018-11-17 ENCOUNTER — Emergency Department (HOSPITAL_COMMUNITY): Payer: Medicare Other

## 2018-11-17 ENCOUNTER — Other Ambulatory Visit: Payer: Self-pay

## 2018-11-17 ENCOUNTER — Encounter (HOSPITAL_COMMUNITY): Payer: Self-pay | Admitting: Emergency Medicine

## 2018-11-17 ENCOUNTER — Emergency Department (HOSPITAL_COMMUNITY)
Admission: EM | Admit: 2018-11-17 | Discharge: 2018-11-17 | Disposition: A | Discharge status: Home / Self Care | Payer: Medicare Other | Attending: Emergency Medicine | Admitting: Emergency Medicine

## 2018-11-17 DIAGNOSIS — I517 Cardiomegaly: Secondary | ICD-10-CM | POA: Insufficient documentation

## 2018-11-17 DIAGNOSIS — R0602 Shortness of breath: Secondary | ICD-10-CM | POA: Insufficient documentation

## 2018-11-17 DIAGNOSIS — I10 Essential (primary) hypertension: Secondary | ICD-10-CM | POA: Diagnosis not present

## 2018-11-17 DIAGNOSIS — R079 Chest pain, unspecified: Secondary | ICD-10-CM

## 2018-11-17 DIAGNOSIS — I252 Old myocardial infarction: Secondary | ICD-10-CM | POA: Insufficient documentation

## 2018-11-17 DIAGNOSIS — Z951 Presence of aortocoronary bypass graft: Secondary | ICD-10-CM | POA: Insufficient documentation

## 2018-11-17 DIAGNOSIS — E039 Hypothyroidism, unspecified: Secondary | ICD-10-CM | POA: Diagnosis not present

## 2018-11-17 DIAGNOSIS — E785 Hyperlipidemia, unspecified: Secondary | ICD-10-CM | POA: Diagnosis not present

## 2018-11-17 DIAGNOSIS — I2510 Atherosclerotic heart disease of native coronary artery without angina pectoris: Secondary | ICD-10-CM | POA: Insufficient documentation

## 2018-11-17 DIAGNOSIS — Z79899 Other long term (current) drug therapy: Secondary | ICD-10-CM | POA: Insufficient documentation

## 2018-11-17 DIAGNOSIS — Z7982 Long term (current) use of aspirin: Secondary | ICD-10-CM | POA: Diagnosis not present

## 2018-11-17 LAB — I-STAT TROPONIN, ED
Troponin i, poc: 0 ng/mL (ref 0.00–0.08)
Troponin i, poc: 0 ng/mL (ref 0.00–0.08)

## 2018-11-17 LAB — CBC
HCT: 44.1 % (ref 36.0–46.0)
Hemoglobin: 13.5 g/dL (ref 12.0–15.0)
MCH: 27.2 pg (ref 26.0–34.0)
MCHC: 30.6 g/dL (ref 30.0–36.0)
MCV: 88.7 fL (ref 80.0–100.0)
Platelets: 253 10*3/uL (ref 150–400)
RBC: 4.97 MIL/uL (ref 3.87–5.11)
RDW: 14.7 % (ref 11.5–15.5)
WBC: 8.9 10*3/uL (ref 4.0–10.5)
nRBC: 0 % (ref 0.0–0.2)

## 2018-11-17 LAB — BASIC METABOLIC PANEL
Anion gap: 11 (ref 5–15)
BUN: 22 mg/dL (ref 8–23)
CO2: 25 mmol/L (ref 22–32)
Calcium: 7.9 mg/dL — ABNORMAL LOW (ref 8.9–10.3)
Chloride: 103 mmol/L (ref 98–111)
Creatinine, Ser: 1.12 mg/dL — ABNORMAL HIGH (ref 0.44–1.00)
GFR calc Af Amer: 56 mL/min — ABNORMAL LOW (ref >60)
GFR calc non Af Amer: 48 mL/min — ABNORMAL LOW (ref >60)
Glucose, Bld: 117 mg/dL — ABNORMAL HIGH (ref 70–99)
Potassium: 3.8 mmol/L (ref 3.5–5.1)
Sodium: 139 mmol/L (ref 135–145)

## 2018-11-17 LAB — BRAIN NATRIURETIC PEPTIDE: B Natriuretic Peptide: 94.7 pg/mL (ref 0.0–100.0)

## 2018-11-17 MED ORDER — NITROGLYCERIN 0.4 MG SL SUBL
0.4000 mg | SUBLINGUAL_TABLET | SUBLINGUAL | Status: DC | PRN
  Administered 2018-11-17 (×2): 0.4 mg via SUBLINGUAL
  Filled 2018-11-17: qty 1

## 2018-11-17 MED ORDER — FUROSEMIDE 40 MG PO TABS: 40.0000 mg | ORAL_TABLET | Freq: Every day | ORAL | 0 refills | Status: DC

## 2018-11-17 NOTE — ED Notes (Signed)
Reevaluated pts chest discomfort. Pt stated her chest discomfort was gone. Pt refused 3rd Nitro. PA notified

## 2018-11-17 NOTE — ED Notes (Signed)
Patient transported to X-ray 

## 2018-11-17 NOTE — ED Notes (Signed)
Patient verbalizes understanding of discharge instructions. Opportunity for questioning and answers were provided. Armband removed by staff, pt discharged from ED ambulatory to home.  

## 2018-11-17 NOTE — ED Provider Notes (Signed)
  Face-to-face evaluation   History: She presents for evaluation of chest discomfort, which started earlier today and is persistent.  She has similar pains frequently between 2 and 5 times a month.  She typically takes nitroglycerin when she has the discomfort and sometimes notices diaphoresis with it.  She states she also has a sensation of gastric reflux with heartburn symptoms associated with this same chest pain.  She takes a PPI.  Today she also took Tums to help the discomfort and it seemed to improve her discomfort.  He denies cough, shortness of breath, weakness or dizziness.  Physical exam:, Calm, cooperative.  Heart regular rate and rhythm without murmur lungs clear.  Chest nontender to palpation.  Medical screening examination/treatment/procedure(s) were conducted as a shared visit with non-physician practitioner(s) and myself.  I personally evaluated the patient during the encounter    Daleen Bo, MD 11/19/18 2032

## 2018-11-17 NOTE — ED Provider Notes (Signed)
[facility] EMERGENCY DEPARTMENT Provider Note   CSN: [id number] Arrival date & time: 11/17/18  1617     History   Chief Complaint No chief complaint on file.   HPI [name] is a 72 y.o. adult with history of MI, CAD, colitis, GERD, HLD, HTN, hyperparathyroidism presents for evaluation of acute onset, intermittent chest pain since yesterday.  She states that yesterday evening while at rest she began to develop substernal chest pain and "heaviness "in her chest.  Pain would radiate to the left shoulder.  Symptoms have been intermittent since yesterday lasting approximately 2 minutes at a time before resolving though she notes the chest pressure/heaviness is constant..  She took 1 nitroglycerin sublingually last night and one this morning with some improvement.  She also tried Tums without significant improvement.  She notes associated diaphoresis and mild shortness of breath but denies lightheadedness, syncope, abdominal pain, vomiting, nausea, or leg swelling.  Pain is not exertional or pleuritic.  No aggravating factors noted.  She is a non-smoker, drinks alcohol rarely, denies any other recreational drug use.  Takes Plavix currently.  The history is provided by the spouse and the patient.    Past Medical History:  Diagnosis Date  . Acute MI, subendocardial ([place])   . Back pain 05/31/2013  . Benign paroxysmal positional vertigo 08/05/2016  . CAD (coronary artery disease)    a. NSTEMI 10/2011: [place] 11/19/11: pLAD 30%, oOM 60%, AVCFX 30%, CFX after OM2 30%, pRCA 60 and 70%, then 99%, AM 80-90% with TIMI 3 flow.  PCI: Promus DES x 2 to RCA; b. 06/2012 Cath: patent RCA stents w/ subtl occl of Acute Marginal (jailed)->Med rx; c. 05/2015 MV: EF 59%, mod mid infsept/inf/ap lat/ap infarct with peri-infarct isch-->Med Rx; d. 02/2016 Cath: LM nl, LAD 3m, RI 50, RCA patent stents.  . Colitis   . Facial skin lesion 01/28/2017  . GERD (gastroesophageal reflux disease)    occasional  . Hyperlipidemia   . Hypertension   . Hypertensive heart disease    a. Echocardiogram 11/19/11: Difficult acoustic windows, EF 60-65%, normal LV wall thickness, grade 1 diastolic dysfunction  . Hypoparathyroidism ([place])   . Low zinc level 11/26/2016  . Multinodular thyroid    Goiter s/p thyroidectomy in 2007 with post-op  hypocalcemia and post-op hypothyroidism/hypoparathyroidism with hypocalcemia  . Neck pain on right side 07/13/2013  . Nocturia 05/31/2013  . Pain in right axilla 03/13/2016  . Palpitations    a. 03/2012 - patient set up for event monitor but did not wear correctly -  she declined wearing a repeat monitor  . Post-surgical hypothyroidism   . Sun-damaged skin 12/05/2014  . Tubular adenoma of colon 06/2011  . Unspecified constipation 05/31/2013  . Vertigo   . Zinc deficiency 11/26/2015    Patient Active Problem List   Diagnosis Date Noted  . ANA positive 10/27/2018  . CAP (community acquired pneumonia) 03/06/2018  . Lower extremity pain, lateral, right 02/17/2018  . Post-menopausal bleeding 02/17/2018  . Cephalalgia 02/17/2018  . Anxiety 02/17/2018  . Peripheral arterial disease ([place]) 02/20/2017  . Facial skin lesion 01/28/2017  . Benign paroxysmal positional vertigo 08/05/2016  . Atypical chest pain 03/27/2016  . Esophageal reflux 03/27/2016  . Antiplatelet or antithrombotic long-term use 03/27/2016  . Pain in right axilla 03/13/2016  . Chest pain 03/07/2016  . Right lumbar radiculopathy 02/21/2016  . Abnormality of gait 02/21/2016  . Zinc deficiency 11/26/2015  . Chronic low back pain 10/26/2015  . Right  hip pain 10/26/2015  . Medicare annual wellness visit, subsequent 03/13/2015  . Preventative health care 03/13/2015  . Sun-damaged skin 12/05/2014  . Angina of effort ([place]) 12/02/2014  . Hx of colonic polyps 09/28/2014  . Benign neoplasm of descending colon 09/28/2014  . Other fatigue 09/10/2014  . Personal history of colonic polyps 07/16/2014  .  Bilateral hand pain 03/20/2014  . Postsurgical hypoparathyroidism ([place]) 08/03/2013  . Neck pain on right side 07/13/2013  . Constipation 05/31/2013  . Back pain 05/31/2013  . Nocturia 05/31/2013  . GERD (gastroesophageal reflux disease) 11/02/2012  . Tension headache 11/02/2012  . Anemia 06/11/2012  . Hypokalemia 06/11/2012  . Depression 01/21/2012  . Headache(784.0) 12/23/2011  . Bradycardia 12/07/2011  . Hypocalcemia 12/07/2011  . Acute MI, subendocardial ([place]) 11/20/2011  . Hypothyroidism 05/20/2009  . Essential hypertension, benign 05/20/2009  . Hyperlipidemia 03/08/2009  . CAD, NATIVE VESSEL 03/08/2009    Past Surgical History:  Procedure Laterality Date  . CARDIAC CATHETERIZATION N/A 03/08/2016   Procedure: Left Heart Cath and Coronary Angiography;  Surgeon: Jettie Booze, MD;  Location: [facility];  Service: Cardiovascular;  Laterality: N/A;  . COLONOSCOPY  Aenomatous polyps   07/05/2011  . COLONOSCOPY N/A 09/28/2014   Procedure: COLONOSCOPY;  Surgeon: Ladene Artist, MD;  Location: [facility];  Service: Endoscopy;  Laterality: N/A;  . CORONARY ANGIOPLASTY WITH STENT PLACEMENT    . LEFT HEART CATHETERIZATION WITH CORONARY ANGIOGRAM N/A 07/17/2012   Procedure: LEFT HEART CATHETERIZATION WITH CORONARY ANGIOGRAM;  Surgeon: Hillary Bow, MD;  Location: [facility] CATH LAB;  Service: Cardiovascular;  Laterality: N/A;  . PERCUTANEOUS CORONARY STENT INTERVENTION (PCI-S) N/A 11/19/2011   Procedure: PERCUTANEOUS CORONARY STENT INTERVENTION (PCI-S);  Surgeon: Hillary Bow, MD;  Location: [facility] CATH LAB;  Service: Cardiovascular;  Laterality: N/A;  . SHOULDER ARTHROSCOPY W/ ROTATOR CUFF REPAIR     right  . TOTAL THYROIDECTOMY  2007   GOITER     OB History    Gravida  4   Para  4   Term  4   Preterm      AB      Living  4     SAB      TAB      Ectopic      Multiple      Live Births  4            Home Medications    Prior to Admission  medications   Medication Sig Start Date End Date Taking? Authorizing Provider  ALPRAZolam (XANAX) 0.25 MG tablet Take 1 tablet (0.25 mg total) by mouth 2 (two) times daily as needed for anxiety. Patient not taking: Reported on 11/18/2018 02/17/18  Yes Mosie Lukes, MD  amLODipine (NORVASC) 10 MG tablet Take 1 tablet (10 mg total) by mouth daily. 10/07/18  Yes Fay Records, MD  aspirin EC 81 MG tablet Take 81 mg by mouth daily.   11/22/11  Yes Dunn, Dayna N, PA-C  B Complex-C (B-COMPLEX WITH VITAMIN C) tablet Take 1 tablet by mouth daily.   Yes [provider]  bisoprolol (ZEBETA) 5 MG tablet Take 1 tablet (5 mg total) by mouth daily. 10/07/18  Yes Fay Records, MD  calcitRIOL (ROCALTROL) 0.25 MCG capsule TAKE 3 CAPSULES(0.75 MCG) BY MOUTH DAILY 05/20/18  Yes Fay Records, MD  cholecalciferol (VITAMIN D) 1000 UNITS tablet Take 1,000 Units by mouth daily.   Yes [provider]  clopidogrel (PLAVIX) 75 MG tablet  Take 1 tablet (75 mg total) by mouth daily. 05/20/18  Yes Fay Records, MD  gabapentin (NEURONTIN) 300 MG capsule Take 1 capsule by mouth 3 (three) times daily as needed (nerve pain).  12/25/16  Yes [provider]  isosorbide mononitrate (IMDUR) 60 MG 24 hr tablet Take 1 tablet (60 mg total) by mouth daily. 10/07/18  Yes Fay Records, MD  ketoconazole (NIZORAL) 2 % cream Apply 1 fingertip amount to each foot daily. 05/01/18  Yes Evelina Bucy, DPM  levothyroxine (SYNTHROID, LEVOTHROID) 100 MCG tablet TAKE 1 TABLET(100 MCG) BY MOUTH DAILY Patient taking differently: Take 100 mcg by mouth every morning.  06/02/18  Yes Mosie Lukes, MD  Multiple Vitamin (MULTIVITAMIN WITH MINERALS) TABS tablet Take 1 tablet by mouth daily.   Yes [provider]  nitroGLYCERIN (NITROSTAT) 0.4 MG SL tablet DISSOLVE 1 TABLET UNDER THE TONGUE EVERY 5 MINUTES AS NEEDED FOR CHEST PAIN AS DIRECTED Patient taking differently: Place 0.4 mg under the tongue every 5 (five) minutes  as needed for chest pain.  06/09/18  Yes Mosie Lukes, MD  pantoprazole (PROTONIX) 40 MG tablet Take 1 tablet (40 mg total) by mouth daily. 05/20/18  Yes Fay Records, MD  REPATHA SURECLICK 016 MG/ML SOAJ ADMINISTER 1 ML UNDER THE SKIN EVERY 14 DAYS Patient taking differently: Inject 1 mL into the skin every 14 (fourteen) days.  10/13/18  Yes Fay Records, MD  rosuvastatin (CRESTOR) 40 MG tablet Take 1 tablet (40 mg total) by mouth daily. 05/20/18  Yes Fay Records, MD  carvedilol (COREG) 3.125 MG tablet Take 1 tablet (3.125 mg total) by mouth 2 (two) times daily. 05/20/18   Fay Records, MD  furosemide (LASIX) 40 MG tablet Take 1 tablet (40 mg total) by mouth daily. 11/17/18   [place] Leckey A, PA-C  hyoscyamine (LEVSIN SL) 0.125 MG SL tablet Place 1 tablet (0.125 mg total) under the tongue every 4 (four) hours as needed. 04/07/18   Mosie Lukes, MD  methylPREDNISolone (MEDROL) 4 MG tablet 6 tabs by mouth once daily for 3 days, then 5 tabs daily x 3 days, then 4 tabs daily x 3 days, then 3 tabs daily x 3 days, then 2 tabs daily x 3 days, then 2 tabs daily x 3 days, then 1 tab daily x 3 days Patient not taking: Reported on 11/17/2018 10/20/18   Debbrah Alar, NP  traMADol (ULTRAM) 50 MG tablet Take 1 tablet (50 mg total) by mouth every 8 (eight) hours as needed. 10/17/18   Debbrah Alar, NP    Family History Family History  Problem Relation Age of Onset  . Colon cancer Brother   . Cancer Brother        COLON  . Hypertension Father   . Heart disease Father   . Heart attack Father   . Blindness Sister   . Congestive Heart Failure Sister   . Hypertension Sister   . Healthy Daughter   . Healthy Son   . Thyroid disease Sister   . Cancer Brother        multiple myelomas  . Healthy Daughter   . Healthy Daughter   . Diabetes Neg Hx   . Prostate cancer Neg Hx   . Breast cancer Neg Hx     Social History Social History   Tobacco Use  . Smoking status: Never Smoker  .  Smokeless tobacco: Never Used  Substance Use Topics  . Alcohol use: No  .  Drug use: No     Allergies   Zetia [ezetimibe]   Review of Systems Review of Systems  Constitutional: Positive for diaphoresis. Negative for chills and fever.  Respiratory: Positive for shortness of breath. Negative for cough.   Cardiovascular: Positive for chest pain. Negative for leg swelling.  Gastrointestinal: Negative for abdominal pain, nausea and vomiting.  All other systems reviewed and are negative.    Physical Exam Updated Vital Signs BP (!) 115/91   Pulse 74   Temp 98.4 F (36.9 C) (Oral)   Resp 16   Ht 5\' 5"  (1.651 m)   Wt 89.8 kg   SpO2 95%   BMI 32.95 kg/m   Physical Exam  Constitutional: She appears well-developed and well-nourished. No distress.  HENT:  Head: Normocephalic and atraumatic.  Eyes: Conjunctivae are normal. Right eye exhibits no discharge. Left eye exhibits no discharge.  Neck: Normal range of motion. Neck supple. No JVD present. No tracheal deviation present.  Cardiovascular: Normal rate, regular rhythm, normal heart sounds and intact distal pulses.  2+ radial and DP/PT pulses bilaterally, Homans sign absent bilaterally, no lower extremity edema, no palpable cords, compartments are soft   Pulmonary/Chest: Effort normal and breath sounds normal. No stridor. No respiratory distress. She has no wheezes. She has no rales. She exhibits no tenderness.  Abdominal: Soft. Bowel sounds are normal. She exhibits no distension. There is no tenderness. There is no guarding.  Musculoskeletal: She exhibits no edema.  Neurological: She is alert.  Skin: Skin is warm and dry. No erythema.  Psychiatric: She has a normal mood and affect. Her behavior is normal.  Nursing note and vitals reviewed.    ED Treatments / Results  Labs (all labs ordered are listed, but only abnormal results are displayed) Labs Reviewed  BASIC METABOLIC PANEL - Abnormal; Notable for the following  components:      Result Value   Glucose, Bld 117 (*)    Creatinine, Ser 1.12 (*)    Calcium 7.9 (*)    GFR calc non Af Amer 48 (*)    GFR calc Af Amer 56 (*)    All other components within normal limits  CBC  BRAIN NATRIURETIC PEPTIDE  I-STAT TROPONIN, ED  I-STAT TROPONIN, ED    EKG EKG Interpretation  Date/Time:  Monday November 17 2018 16:36:05 EST Ventricular Rate:  80 PR Interval:    QRS Duration: 145 QT Interval:  412 QTC Calculation: 476 R Axis:   24 Text Interpretation:  Sinus rhythm Right bundle branch block Abnormal inferior Q waves since last tracing no significant change Confirmed by Daleen Bo [phone]) on 11/17/2018 4:50:42 PM Also confirmed by Daleen Bo [phone]), editor Philomena Doheny [phone])  on 11/17/2018 5:08:44 PM   Radiology Dg Chest 2 View  Result Date: 11/17/2018 CLINICAL DATA:  Chest pain. Heaviness in chest beginning today without activity. EXAM: CHEST - 2 VIEW COMPARISON:  Two-view chest x-ray 04/30/2018 FINDINGS: The heart is mildly enlarged on the lateral view. Coronary artery calcifications are evident. Atherosclerotic calcifications are present in the aortic arch. Mild pulmonary vascular congestion is new. Areas of linear atelectasis are present at both bases. No significant effusion is present. No other airspace consolidation is present. Mild degenerative changes are present in the thoracic spine. IMPRESSION: 1. Mild cardiomegaly and pulmonary vascular congestion suggesting early congestive heart failure. 2. Aortic atherosclerosis. 3. Mild bibasilar airspace disease likely reflects atelectasis. Electronically Signed   By: San Morelle M.D.   On: 11/17/2018 18:31  Xr Knee 3 View Left  Result Date: 11/18/2018 Moderate medial compartment narrowing was noted.  Moderate patellofemoral narrowing was noted.  No chondrocalcinosis was noted. Impression: Moderate osteoarthritis and moderate chondromalacia patella.  Xr Knee 3 View Right  Result  Date: 11/18/2018 Mild medial compartment narrowing was noted.  No chondrocalcinosis was noted.  Moderate patellofemoral narrowing was noted. Impression: Mild osteoarthritis and moderate chondromalacia patella.   Procedures Procedures (including critical care time)  Medications Ordered in ED Medications - No data to display   Initial Impression / Assessment and Plan / ED Course  I have reviewed the triage vital signs and the nursing notes.  Pertinent labs & imaging results that were available during my care of the patient were reviewed by me and considered in my medical decision making (see chart for details).     Patient presenting for evaluation of intermittent chest pain since yesterday.  Patient is afebrile, vital signs are stable.  She is nontoxic in appearance.  Pain is not reproducible on palpation.  EKG shows no acute changes from last tracing.  Lab work significant for mildly elevated creatinine, BUN within normal limits so likely not acute.  No leukocytosis, no anemia, no metabolic derangements.  Chest x-ray with mild cardiomegaly and mild pulmonary vascular congestion suggesting early CHF.  EF on echo in 2017 60 to 65% which is reassuring.  Patient had improvement in her pain with sublingual nitroglycerin.   7:35 PM Spoke with Dr. Meda Coffee with cardiology who recommends admission for further evaluation and management.  However, she states that if the patient feels strongly that she would like to go home she thinks it is reasonable to obtain a second troponin, add a BNP and give the patient Lasix 40 mg daily with plan to follow-up with cardiology this week.  Patient does not want to stay any longer.  Second troponin is negative.  No evidence of ACS/MI at this time.  Doubt PE, dissection, cardiac tamponade, esophageal rupture, pneumothorax, or pneumonia.  She has good follow-up with her cardiologist.  Will discharge with low-dose Lasix.  She understands that cardiology service will reach  out to her for follow-up likely this week.  Discussed strict ED return precautions.  Patient and family members verbalized understanding of and agreement with plan and patient is stable for discharge home at this time.  Patient seen by Dr. Eulis Foster who agrees with assessment and plan at this time.  Final Clinical Impressions(s) / ED Diagnoses   Final diagnoses:  Left-sided chest pain  Cardiomegaly    ED Discharge Orders         Ordered    furosemide (LASIX) 40 MG tablet  Daily     11/17/18 2054           Renita Papa, PA-C 11/18/18 2054    Daleen Bo, MD 11/19/18 2033

## 2018-11-17 NOTE — Discharge Instructions (Signed)
Start taking Lasix once daily.  Follow-up with cardiology this week.  Return to the emergency department if any concerning signs or symptoms develop such as worsening pain, shortness of breath, passing out, or if pain is not improved with nitroglycerin.

## 2018-11-17 NOTE — ED Triage Notes (Signed)
Pt came from home. Pt complains of heaviness in chest and back pain in between shoulders since this morning. Pt also complains of burping a lot. Pt has cardiac history with a MI and two stents placed.

## 2018-11-17 NOTE — ED Notes (Signed)
Pt stated she is not having chest pain but chest discomfort. Pt states her chest is just heavy. Gave pt another nitro with the understanding we are trying to relieve her discomfort. PA notified

## 2018-11-18 ENCOUNTER — Ambulatory Visit (INDEPENDENT_AMBULATORY_CARE_PROVIDER_SITE_OTHER): Payer: Self-pay

## 2018-11-18 ENCOUNTER — Encounter: Payer: Self-pay | Admitting: Rheumatology

## 2018-11-18 ENCOUNTER — Ambulatory Visit (INDEPENDENT_AMBULATORY_CARE_PROVIDER_SITE_OTHER): Payer: Medicare Other | Admitting: Rheumatology

## 2018-11-18 VITALS — BP 150/79 | HR 73 | Resp 14 | Ht 64.0 in | Wt 197.6 lb

## 2018-11-18 DIAGNOSIS — Z8679 Personal history of other diseases of the circulatory system: Secondary | ICD-10-CM

## 2018-11-18 DIAGNOSIS — M25562 Pain in left knee: Secondary | ICD-10-CM | POA: Diagnosis not present

## 2018-11-18 DIAGNOSIS — G8929 Other chronic pain: Secondary | ICD-10-CM

## 2018-11-18 DIAGNOSIS — I739 Peripheral vascular disease, unspecified: Secondary | ICD-10-CM

## 2018-11-18 DIAGNOSIS — M5136 Other intervertebral disc degeneration, lumbar region: Secondary | ICD-10-CM | POA: Diagnosis not present

## 2018-11-18 DIAGNOSIS — M25561 Pain in right knee: Secondary | ICD-10-CM | POA: Diagnosis not present

## 2018-11-18 DIAGNOSIS — F419 Anxiety disorder, unspecified: Secondary | ICD-10-CM

## 2018-11-18 DIAGNOSIS — M79641 Pain in right hand: Secondary | ICD-10-CM

## 2018-11-18 DIAGNOSIS — M79642 Pain in left hand: Secondary | ICD-10-CM

## 2018-11-18 DIAGNOSIS — Z8719 Personal history of other diseases of the digestive system: Secondary | ICD-10-CM

## 2018-11-18 DIAGNOSIS — R768 Other specified abnormal immunological findings in serum: Secondary | ICD-10-CM

## 2018-11-18 DIAGNOSIS — Z8639 Personal history of other endocrine, nutritional and metabolic disease: Secondary | ICD-10-CM

## 2018-11-18 DIAGNOSIS — I10 Essential (primary) hypertension: Secondary | ICD-10-CM

## 2018-11-18 DIAGNOSIS — I252 Old myocardial infarction: Secondary | ICD-10-CM

## 2018-11-22 LAB — COMPLETE METABOLIC PANEL WITH GFR
AG RATIO: 1.4 (calc) (ref 1.0–2.5)
ALKALINE PHOSPHATASE (APISO): 90 U/L (ref 33–130)
ALT: 18 U/L (ref 6–29)
AST: 18 U/L (ref 10–35)
Albumin: 4.4 g/dL (ref 3.6–5.1)
BILIRUBIN TOTAL: 0.6 mg/dL (ref 0.2–1.2)
BUN/Creatinine Ratio: 22 (calc) (ref 6–22)
BUN: 21 mg/dL (ref 7–25)
CHLORIDE: 101 mmol/L (ref 98–110)
CO2: 30 mmol/L (ref 20–32)
Calcium: 8.9 mg/dL (ref 8.6–10.4)
Creat: 0.94 mg/dL — ABNORMAL HIGH (ref 0.60–0.93)
GFR, Est African American: 71 mL/min/{1.73_m2} (ref >=60)
GFR, Est Non African American: 61 mL/min/{1.73_m2} (ref >=60)
GLUCOSE: 79 mg/dL (ref 65–99)
Globulin: 3.2 g/dL (calc) (ref 1.9–3.7)
POTASSIUM: 3.9 mmol/L (ref 3.5–5.3)
SODIUM: 141 mmol/L (ref 135–146)
Total Protein: 7.6 g/dL (ref 6.1–8.1)

## 2018-11-22 LAB — CARDIOLIPIN ANTIBODIES, IGG, IGM, IGA: Anticardiolipin IgM: 17 [MPL'U] — ABNORMAL HIGH

## 2018-11-22 LAB — SJOGRENS SYNDROME-B EXTRACTABLE NUCLEAR ANTIBODY: SSB (La) (ENA) Antibody, IgG: <1 AI

## 2018-11-22 LAB — LUPUS ANTICOAGULANT EVAL W/ REFLEX
PTT-LA Screen: 37 s (ref <=40)
dRVVT: 44 s (ref <=45)

## 2018-11-22 LAB — URINALYSIS, ROUTINE W REFLEX MICROSCOPIC
Bilirubin Urine: NEGATIVE
GLUCOSE, UA: NEGATIVE
HGB URINE DIPSTICK: NEGATIVE
Hyaline Cast: NONE SEEN /LPF
Nitrite: NEGATIVE
Specific Gravity, Urine: 1.02 (ref 1.001–1.03)
pH: 8 (ref 5.0–8.0)

## 2018-11-22 LAB — C3 AND C4
C3 Complement: 189 mg/dL (ref 83–193)
C4 Complement: 39 mg/dL (ref 15–57)

## 2018-11-22 LAB — BETA-2 GLYCOPROTEIN ANTIBODIES
Beta-2 Glyco 1 IgM: 32 SMU — ABNORMAL HIGH (ref <=20)
Beta-2 Glyco I IgG: <9 SGU (ref <=20)

## 2018-11-22 LAB — 14-3-3 ETA PROTEIN

## 2018-11-22 LAB — CYCLIC CITRUL PEPTIDE ANTIBODY, IGG

## 2018-11-22 LAB — ANTI-SCLERODERMA ANTIBODY: SCLERODERMA (SCL-70) (ENA) ANTIBODY, IGG: NEGATIVE AI

## 2018-11-22 LAB — ANTI-DNA ANTIBODY, DOUBLE-STRANDED: DS DNA AB: 1 [IU]/mL

## 2018-11-22 LAB — SJOGRENS SYNDROME-A EXTRACTABLE NUCLEAR ANTIBODY: SSA (Ro) (ENA) Antibody, IgG: <1 AI

## 2018-11-22 LAB — RNP ANTIBODY: Ribonucleic Protein(ENA) Antibody, IgG: 2.7 AI — AB

## 2018-11-22 LAB — ANTI-SMITH ANTIBODY: ENA SM Ab Ser-aCnc: <1 AI

## 2018-11-24 NOTE — Progress Notes (Signed)
We will discuss at the follow-up visit.

## 2018-12-05 ENCOUNTER — Encounter: Payer: Self-pay | Admitting: Internal Medicine

## 2018-12-05 ENCOUNTER — Ambulatory Visit (INDEPENDENT_AMBULATORY_CARE_PROVIDER_SITE_OTHER): Payer: Medicare Other | Admitting: Internal Medicine

## 2018-12-05 ENCOUNTER — Encounter: Payer: Self-pay | Admitting: *Deleted

## 2018-12-05 VITALS — BP 118/68 | HR 73 | Ht 64.0 in | Wt 200.8 lb

## 2018-12-05 DIAGNOSIS — R0602 Shortness of breath: Secondary | ICD-10-CM | POA: Diagnosis not present

## 2018-12-05 DIAGNOSIS — E876 Hypokalemia: Secondary | ICD-10-CM | POA: Diagnosis not present

## 2018-12-05 DIAGNOSIS — Z01812 Encounter for preprocedural laboratory examination: Secondary | ICD-10-CM | POA: Diagnosis not present

## 2018-12-05 DIAGNOSIS — I10 Essential (primary) hypertension: Secondary | ICD-10-CM

## 2018-12-05 DIAGNOSIS — E039 Hypothyroidism, unspecified: Secondary | ICD-10-CM | POA: Diagnosis not present

## 2018-12-05 NOTE — H&P (View-Only) (Signed)
Cardiology Office Note   Date:  12/05/2018   ID:  [name], DOB 12-Jan-1946, MRN [id number]  PCP:  Mosie Lukes, MD  Cardiologist:   Dorris Carnes, MD    F/U of CAD     History of Present Illness: [name] is a 72 y.o. adult with a history of CAD - s/p NSTEMI 10/2011 with DESx2 to RCA and normal EF. She underwent repeat LHC on 07/17/12 Patent RCA stent LCA with collaterals to R acute marginal were unchanged. Subtotal occlusion of acute marginal (exits mid stent). Recomm for medical Rx. Other issues include thyroidectomy, GERD, HTN, HLD, vertigo.Last myoviue showed inferolateral scar with minimal periinfarct ischemia  I saw the patient earlier this fall in clinic    She spent several weeks in [place] this summer  When I saw her earlier this fall she was doing OK    Since then however, things have changed    She notices she is giving out more with activity   Has to stop when she does chores at the house   She was seen in ED on 11/25   [facility] has having chest discomfort that lasted hours    Enzymes were negatvie  There was question that pain was GI in origin  Since seen she continues to have dyspnea with activity  Has to stop after chores that she used to be able to do  Current Meds  Medication Sig  . ALPRAZolam (XANAX) 0.25 MG tablet Take 1 tablet (0.25 mg total) by mouth 2 (two) times daily as needed for anxiety.  Marland Kitchen amLODipine (NORVASC) 10 MG tablet Take 1 tablet (10 mg total) by mouth daily.  Marland Kitchen aspirin EC 81 MG tablet Take 81 mg by mouth daily.    . B Complex-C (B-COMPLEX WITH VITAMIN C) tablet Take 1 tablet by mouth daily.  . calcitRIOL (ROCALTROL) 0.25 MCG capsule TAKE 3 CAPSULES(0.75 MCG) BY MOUTH DAILY  . carvedilol (COREG) 3.125 MG tablet Take 1 tablet (3.125 mg total) by mouth 2 (two) times daily.  . cholecalciferol (VITAMIN D) 1000 UNITS tablet Take 1,000 Units by mouth daily.  . clopidogrel (PLAVIX) 75 MG tablet Take 1 tablet (75 mg total) by mouth daily.    . furosemide (LASIX) 40 MG tablet Take 1 tablet (40 mg total) by mouth daily.  Marland Kitchen gabapentin (NEURONTIN) 300 MG capsule Take 1 capsule by mouth 3 (three) times daily as needed (nerve pain).   . hyoscyamine (LEVSIN SL) 0.125 MG SL tablet Place 1 tablet (0.125 mg total) under the tongue every 4 (four) hours as needed.  . isosorbide mononitrate (IMDUR) 60 MG 24 hr tablet Take 1 tablet (60 mg total) by mouth daily.  Marland Kitchen ketoconazole (NIZORAL) 2 % cream Apply 1 fingertip amount to each foot daily.  Marland Kitchen levothyroxine (SYNTHROID, LEVOTHROID) 100 MCG tablet TAKE 1 TABLET(100 MCG) BY MOUTH DAILY (Patient taking differently: Take 100 mcg by mouth every morning. )  . methylPREDNISolone (MEDROL) 4 MG tablet 6 tabs by mouth once daily for 3 days, then 5 tabs daily x 3 days, then 4 tabs daily x 3 days, then 3 tabs daily x 3 days, then 2 tabs daily x 3 days, then 2 tabs daily x 3 days, then 1 tab daily x 3 days  . Multiple Vitamin (MULTIVITAMIN WITH MINERALS) TABS tablet Take 1 tablet by mouth daily.  . nitroGLYCERIN (NITROSTAT) 0.4 MG SL tablet DISSOLVE 1 TABLET UNDER THE TONGUE EVERY 5 MINUTES AS NEEDED FOR CHEST PAIN AS  DIRECTED (Patient taking differently: Place 0.4 mg under the tongue every 5 (five) minutes as needed for chest pain. )  . pantoprazole (PROTONIX) 40 MG tablet Take 1 tablet (40 mg total) by mouth daily.  Marland Kitchen REPATHA SURECLICK 161 MG/ML SOAJ ADMINISTER 1 ML UNDER THE SKIN EVERY 14 DAYS (Patient taking differently: Inject 1 mL into the skin every 14 (fourteen) days. )  . rosuvastatin (CRESTOR) 40 MG tablet Take 1 tablet (40 mg total) by mouth daily.  . traMADol (ULTRAM) 50 MG tablet Take 1 tablet (50 mg total) by mouth every 8 (eight) hours as needed.     Allergies:   Zetia [ezetimibe]   Past Medical History:  Diagnosis Date  . Acute MI, subendocardial ([place])   . Back pain 05/31/2013  . Benign paroxysmal positional vertigo 08/05/2016  . CAD (coronary artery disease)    a. NSTEMI 10/2011: [place]  11/19/11: pLAD 30%, oOM 60%, AVCFX 30%, CFX after OM2 30%, pRCA 60 and 70%, then 99%, AM 80-90% with TIMI 3 flow.  PCI: Promus DES x 2 to RCA; b. 06/2012 Cath: patent RCA stents w/ subtl occl of Acute Marginal (jailed)->Med rx; c. 05/2015 MV: EF 59%, mod mid infsept/inf/ap lat/ap infarct with peri-infarct isch-->Med Rx; d. 02/2016 Cath: LM nl, LAD 32m, RI 50, RCA patent stents.  . Colitis   . Facial skin lesion 01/28/2017  . GERD (gastroesophageal reflux disease)    occasional  . Hyperlipidemia   . Hypertension   . Hypertensive heart disease    a. Echocardiogram 11/19/11: Difficult acoustic windows, EF 60-65%, normal LV wall thickness, grade 1 diastolic dysfunction  . Hypoparathyroidism ([place])   . Low zinc level 11/26/2016  . Multinodular thyroid    Goiter s/p thyroidectomy in 2007 with post-op  hypocalcemia and post-op hypothyroidism/hypoparathyroidism with hypocalcemia  . Neck pain on right side 07/13/2013  . Nocturia 05/31/2013  . Pain in right axilla 03/13/2016  . Palpitations    a. 03/2012 - patient set up for event monitor but did not wear correctly -  she declined wearing a repeat monitor  . Post-surgical hypothyroidism   . Sun-damaged skin 12/05/2014  . Tubular adenoma of colon 06/2011  . Unspecified constipation 05/31/2013  . Vertigo   . Zinc deficiency 11/26/2015    Past Surgical History:  Procedure Laterality Date  . CARDIAC CATHETERIZATION N/A 03/08/2016   Procedure: Left Heart Cath and Coronary Angiography;  Surgeon: Jettie Booze, MD;  Location: [facility];  Service: Cardiovascular;  Laterality: N/A;  . COLONOSCOPY  Aenomatous polyps   07/05/2011  . COLONOSCOPY N/A 09/28/2014   Procedure: COLONOSCOPY;  Surgeon: Ladene Artist, MD;  Location: [facility];  Service: Endoscopy;  Laterality: N/A;  . CORONARY ANGIOPLASTY WITH STENT PLACEMENT    . LEFT HEART CATHETERIZATION WITH CORONARY ANGIOGRAM N/A 07/17/2012   Procedure: LEFT HEART CATHETERIZATION WITH CORONARY ANGIOGRAM;   Surgeon: Hillary Bow, MD;  Location: [facility] CATH LAB;  Service: Cardiovascular;  Laterality: N/A;  . PERCUTANEOUS CORONARY STENT INTERVENTION (PCI-S) N/A 11/19/2011   Procedure: PERCUTANEOUS CORONARY STENT INTERVENTION (PCI-S);  Surgeon: Hillary Bow, MD;  Location: [facility] CATH LAB;  Service: Cardiovascular;  Laterality: N/A;  . SHOULDER ARTHROSCOPY W/ ROTATOR CUFF REPAIR     right  . TOTAL THYROIDECTOMY  2007   GOITER     Social History:  The patient  reports that he has never smoked. He has never used smokeless tobacco. He reports that he does not drink alcohol or use drugs.  Family History:  The patient's family history includes Blindness in his sister; Cancer in his brother and brother; Colon cancer in his brother; Congestive Heart Failure in his sister; Healthy in his daughter, daughter, daughter, and son; Heart attack in his father; Heart disease in his father; Hypertension in his father and sister; Thyroid disease in his sister.    ROS:  Please see the history of present illness. All other systems are reviewed and  Negative to the above problem except as noted.    PHYSICAL EXAM: VS:  BP 118/68   Pulse 73   Ht 5\' 4"  (1.626 m)   Wt 200 lb 12.8 oz (91.1 kg)   SpO2 97%   BMI 34.47 kg/m   GEN: Obese 72 yo in no acute distress  HEENT: normal  Neck:  JVP is normal No bruits   Cardiac: RRR; no murmurs, rubs, or gallops,no edema     1+PT pulses   Respiratory:  clear to auscultation bilaterally, normal work of breathing GI: soft, nontender, nondistended, + BS   MS: no deformity Moving all extremities   Skin: warm and dry, no rash Neuro:  Strength and sensation are intact Psych: euthymic mood, full affect   EKG:  EKG is not ordered today.   Lipid Panel    Component Value Date/Time   CHOL 101 10/07/2018 1435   TRIG 146 10/07/2018 1435   HDL 48 10/07/2018 1435   CHOLHDL 2.1 10/07/2018 1435   CHOLHDL 2 02/17/2018 1514   VLDL 30.6 02/17/2018 1514   LDLCALC 24 10/07/2018  1435   LDLDIRECT 26 05/20/2018 1251   LDLDIRECT 83.8 08/11/2012 1203      Wt Readings from Last 3 Encounters:  12/05/18 200 lb 12.8 oz (91.1 kg)  11/18/18 197 lb 9.6 oz (89.6 kg)  11/17/18 198 lb (89.8 kg)      ASSESSMENT AND PLAN:  1  CAD I am concerned about the pt's continued SOB with activity   This is new   I cannot explain  I am concerned that it is an anginal equivalent     I have discussed this with tp    I would recomm L heart cath tot redefine pt coronary anatomy     2.  Hx GERD  Continue meds    3  HL Continue Repatha      Current medicines are reviewed at length with the patient today.  The patient does not have concerns regarding medicines.  Signed, Dorris Carnes, MD  12/05/2018 2:55 PM    [address], [place], [place]  [address] Phone: [phone]; Fax: [phone]

## 2018-12-05 NOTE — Progress Notes (Signed)
Cardiology Office Note   Date:  12/05/2018   ID:  CIRE DEYARMIN, DOB 10-31-1946, MRN [id number]  PCP:  Mosie Lukes, MD  Cardiologist:   Dorris Carnes, MD    F/U of CAD     History of Present Illness: [name] is a 72 y.o. adult with a history of CAD - s/p NSTEMI 10/2011 with DESx2 to RCA and normal EF. She underwent repeat LHC on 07/17/12 Patent RCA stent LCA with collaterals to R acute marginal were unchanged. Subtotal occlusion of acute marginal (exits mid stent). Recomm for medical Rx. Other issues include thyroidectomy, GERD, HTN, HLD, vertigo.Last myoviue showed inferolateral scar with minimal periinfarct ischemia  I saw the patient earlier this fall in clinic    She spent several weeks in [place] this summer  When I saw her earlier this fall she was doing OK    Since then however, things have changed    She notices she is giving out more with activity   Has to stop when she does chores at the house   She was seen in ED on 11/25    [facility] has having chest discomfort that lasted hours    Enzymes were negatvie  There was question that pain was GI in origin  Since seen she continues to have dyspnea with activity  Has to stop after chores that she used to be able to do  Current Meds  Medication Sig  . ALPRAZolam (XANAX) 0.25 MG tablet Take 1 tablet (0.25 mg total) by mouth 2 (two) times daily as needed for anxiety.  Marland Kitchen amLODipine (NORVASC) 10 MG tablet Take 1 tablet (10 mg total) by mouth daily.  Marland Kitchen aspirin EC 81 MG tablet Take 81 mg by mouth daily.    . B Complex-C (B-COMPLEX WITH VITAMIN C) tablet Take 1 tablet by mouth daily.  . calcitRIOL (ROCALTROL) 0.25 MCG capsule TAKE 3 CAPSULES(0.75 MCG) BY MOUTH DAILY  . carvedilol (COREG) 3.125 MG tablet Take 1 tablet (3.125 mg total) by mouth 2 (two) times daily.  . cholecalciferol (VITAMIN D) 1000 UNITS tablet Take 1,000 Units by mouth daily.  . clopidogrel (PLAVIX) 75 MG tablet Take 1 tablet (75 mg total) by mouth daily.    . furosemide (LASIX) 40 MG tablet Take 1 tablet (40 mg total) by mouth daily.  Marland Kitchen gabapentin (NEURONTIN) 300 MG capsule Take 1 capsule by mouth 3 (three) times daily as needed (nerve pain).   . hyoscyamine (LEVSIN SL) 0.125 MG SL tablet Place 1 tablet (0.125 mg total) under the tongue every 4 (four) hours as needed.  . isosorbide mononitrate (IMDUR) 60 MG 24 hr tablet Take 1 tablet (60 mg total) by mouth daily.  Marland Kitchen ketoconazole (NIZORAL) 2 % cream Apply 1 fingertip amount to each foot daily.  Marland Kitchen levothyroxine (SYNTHROID, LEVOTHROID) 100 MCG tablet TAKE 1 TABLET(100 MCG) BY MOUTH DAILY (Patient taking differently: Take 100 mcg by mouth every morning. )  . methylPREDNISolone (MEDROL) 4 MG tablet 6 tabs by mouth once daily for 3 days, then 5 tabs daily x 3 days, then 4 tabs daily x 3 days, then 3 tabs daily x 3 days, then 2 tabs daily x 3 days, then 2 tabs daily x 3 days, then 1 tab daily x 3 days  . Multiple Vitamin (MULTIVITAMIN WITH MINERALS) TABS tablet Take 1 tablet by mouth daily.  . nitroGLYCERIN (NITROSTAT) 0.4 MG SL tablet DISSOLVE 1 TABLET UNDER THE TONGUE EVERY 5 MINUTES AS NEEDED FOR CHEST PAIN AS  DIRECTED (Patient taking differently: Place 0.4 mg under the tongue every 5 (five) minutes as needed for chest pain. )  . pantoprazole (PROTONIX) 40 MG tablet Take 1 tablet (40 mg total) by mouth daily.  Marland Kitchen REPATHA SURECLICK 701 MG/ML SOAJ ADMINISTER 1 ML UNDER THE SKIN EVERY 14 DAYS (Patient taking differently: Inject 1 mL into the skin every 14 (fourteen) days. )  . rosuvastatin (CRESTOR) 40 MG tablet Take 1 tablet (40 mg total) by mouth daily.  . traMADol (ULTRAM) 50 MG tablet Take 1 tablet (50 mg total) by mouth every 8 (eight) hours as needed.     Allergies:   Zetia [ezetimibe]   Past Medical History:  Diagnosis Date  . Acute MI, subendocardial ([place])   . Back pain 05/31/2013  . Benign paroxysmal positional vertigo 08/05/2016  . CAD (coronary artery disease)    a. NSTEMI 10/2011: [place]  11/19/11: pLAD 30%, oOM 60%, AVCFX 30%, CFX after OM2 30%, pRCA 60 and 70%, then 99%, AM 80-90% with TIMI 3 flow.  PCI: Promus DES x 2 to RCA; b. 06/2012 Cath: patent RCA stents w/ subtl occl of Acute Marginal (jailed)->Med rx; c. 05/2015 MV: EF 59%, mod mid infsept/inf/ap lat/ap infarct with peri-infarct isch-->Med Rx; d. 02/2016 Cath: LM nl, LAD 82m, RI 50, RCA patent stents.  . Colitis   . Facial skin lesion 01/28/2017  . GERD (gastroesophageal reflux disease)    occasional  . Hyperlipidemia   . Hypertension   . Hypertensive heart disease    a. Echocardiogram 11/19/11: Difficult acoustic windows, EF 60-65%, normal LV wall thickness, grade 1 diastolic dysfunction  . Hypoparathyroidism ([place])   . Low zinc level 11/26/2016  . Multinodular thyroid    Goiter s/p thyroidectomy in 2007 with post-op  hypocalcemia and post-op hypothyroidism/hypoparathyroidism with hypocalcemia  . Neck pain on right side 07/13/2013  . Nocturia 05/31/2013  . Pain in right axilla 03/13/2016  . Palpitations    a. 03/2012 - patient set up for event monitor but did not wear correctly -  she declined wearing a repeat monitor  . Post-surgical hypothyroidism   . Sun-damaged skin 12/05/2014  . Tubular adenoma of colon 06/2011  . Unspecified constipation 05/31/2013  . Vertigo   . Zinc deficiency 11/26/2015    Past Surgical History:  Procedure Laterality Date  . CARDIAC CATHETERIZATION N/A 03/08/2016   Procedure: Left Heart Cath and Coronary Angiography;  Surgeon: Jettie Booze, MD;  Location: [facility];  Service: Cardiovascular;  Laterality: N/A;  . COLONOSCOPY  Aenomatous polyps   07/05/2011  . COLONOSCOPY N/A 09/28/2014   Procedure: COLONOSCOPY;  Surgeon: Ladene Artist, MD;  Location: [facility];  Service: Endoscopy;  Laterality: N/A;  . CORONARY ANGIOPLASTY WITH STENT PLACEMENT    . LEFT HEART CATHETERIZATION WITH CORONARY ANGIOGRAM N/A 07/17/2012   Procedure: LEFT HEART CATHETERIZATION WITH CORONARY ANGIOGRAM;   Surgeon: Hillary Bow, MD;  Location: [facility] CATH LAB;  Service: Cardiovascular;  Laterality: N/A;  . PERCUTANEOUS CORONARY STENT INTERVENTION (PCI-S) N/A 11/19/2011   Procedure: PERCUTANEOUS CORONARY STENT INTERVENTION (PCI-S);  Surgeon: Hillary Bow, MD;  Location[facility] C[facility] LAB;  Service: Cardiovascular;  Laterality: N/A;  . SHOULDER ARTHROSCOPY W/ ROTATOR CUFF REPAIR     right  . TOTAL THYROIDECTOMY  2007   GOITER     Social History:  The patient  reports that he has never smoked. He has never used smokeless tobacco. He reports that he does not drink alcohol or use drugs.  Family History:  The patient's family history includes Blindness in his sister; Cancer in his brother and brother; Colon cancer in his brother; Congestive Heart Failure in his sister; Healthy in his daughter, daughter, daughter, and son; Heart attack in his father; Heart disease in his father; Hypertension in his father and sister; Thyroid disease in his sister.    ROS:  Please see the history of present illness. All other systems are reviewed and  Negative to the above problem except as noted.    PHYSICAL EXAM: VS:  BP 118/68   Pulse 73   Ht 5\' 4"  (1.626 m)   Wt 200 lb 12.8 oz (91.1 kg)   SpO2 97%   BMI 34.47 kg/m   GEN: Obese 72 yo in no acute distress  HEENT: normal  Neck:  JVP is normal No bruits   Cardiac: RRR; no murmurs, rubs, or gallops,no edema     1+PT pulses   Respiratory:  clear to auscultation bilaterally, normal work of breathing GI: soft, nontender, nondistended, + BS   MS: no deformity Moving all extremities   Skin: warm and dry, no rash Neuro:  Strength and sensation are intact Psych: euthymic mood, full affect   EKG:  EKG is not ordered today.   Lipid Panel    Component Value Date/Time   CHOL 101 10/07/2018 1435   TRIG 146 10/07/2018 1435   HDL 48 10/07/2018 1435   CHOLHDL 2.1 10/07/2018 1435   CHOLHDL 2 02/17/2018 1514   VLDL 30.6 02/17/2018 1514   LDLCALC 24 10/07/2018  1435   LDLDIRECT 26 05/20/2018 1251   LDLDIRECT 83.8 08/11/2012 1203      Wt Readings from Last 3 Encounters:  12/05/18 200 lb 12.8 oz (91.1 kg)  11/18/18 197 lb 9.6 oz (89.6 kg)  11/17/18 198 lb (89.8 kg)      ASSESSMENT AND PLAN:  1  CAD I am concerned about the pt's continued SOB with activity   This is new   I cannot explain  I am concerned that it is an anginal equivalent     I have discussed this with tp    I would recomm L heart cath tot redefine pt coronary anatomy     2.  Hx GERD  Continue meds    3  HL Continue Repatha      Current medicines are reviewed at length with the patient today.  The patient does not have concerns regarding medicines.  Signed, Dorris Carnes, MD  12/05/2018 2:55 PM    [address], [place], [place]  [address] Phone: [phone]; Fax: [phone]

## 2018-12-05 NOTE — Patient Instructions (Addendum)
Medication Instructions:  No changes If you need a refill on your cardiac medications before your next appointment, please call your pharmacy.   Lab work: Today cbc, bmet, D D[place] If you have labs (blood work) drawn today and your tests are completely normal, you will receive your results only by: Marland Kitchen MyChart Message (if you have MyChart) OR . A paper copy in the mail If you have any lab test that is abnormal or we need to change your treatment, we will call you to review the results.  Testing/Procedures: Your physician has requested that you have a cardiac catheterization. Cardiac catheterization is used to diagnose and/or treat various heart conditions. Doctors may recommend this procedure for a number of different reasons. The most common reason is to evaluate chest pain. Chest pain can be a symptom of coronary artery disease (CAD), and cardiac catheterization can show whether plaque is narrowing or blocking your heart's arteries. This procedure is also used to evaluate the valves, as well as measure the blood flow and oxygen levels in different parts of your heart. For further information please visit [email or web address]. Please follow instruction sheet, as given.   Follow-Up: Follow up with your physician will depend on test results.  Any Other Special Instructions Will Be Listed Below (If Applicable).

## 2018-12-06 LAB — BASIC METABOLIC PANEL
BUN / CREAT RATIO: 20 (ref 12–28)
BUN: 21 mg/dL (ref 8–27)
CHLORIDE: 97 mmol/L (ref 96–106)
CO2: 27 mmol/L (ref 20–29)
Calcium: 8.1 mg/dL — ABNORMAL LOW (ref 8.7–10.3)
Creatinine, Ser: 1.04 mg/dL — ABNORMAL HIGH (ref 0.57–1.00)
GFR calc Af Amer: 62 mL/min/{1.73_m2} (ref >59)
GFR calc non Af Amer: 54 mL/min/{1.73_m2} — ABNORMAL LOW (ref >59)
GLUCOSE: 91 mg/dL (ref 65–99)
POTASSIUM: 4.1 mmol/L (ref 3.5–5.2)
SODIUM: 144 mmol/L (ref 134–144)

## 2018-12-06 LAB — CBC
HEMATOCRIT: 42.3 % (ref 34.0–46.6)
Hemoglobin: 14.2 g/dL (ref 11.1–15.9)
MCH: 28.3 pg (ref 26.6–33.0)
MCHC: 33.6 g/dL (ref 31.5–35.7)
MCV: 84 fL (ref 79–97)
PLATELETS: 299 10*3/uL (ref 150–450)
RBC: 5.02 x10E6/uL (ref 3.77–5.28)
RDW: 14.4 % (ref 12.3–15.4)
WBC: 9 10*3/uL (ref 3.4–10.8)

## 2018-12-06 LAB — D-DIMER, QUANTITATIVE (NOT AT ARMC)

## 2018-12-08 ENCOUNTER — Telehealth: Payer: Self-pay | Admitting: *Deleted

## 2018-12-08 NOTE — Telephone Encounter (Signed)
Pt contacted pre-catheterization scheduled at [facility] for: Tuesday December 09, 2018 12 noon Verified arrival time and place: [facility] at: 10 AM  No solid food after midnight prior to cath, clear liquids until 5 AM day of procedure. Contrast allergy: no Verified no diabetes medications.  Hold: Furosemide-AM of procedure.  Except hold medications AM meds can be  taken pre-cath with sip of water including: ASA 81 mg  Confirmed patient has responsible person to drive home post procedure and for 24 hours after you arrive home: yes

## 2018-12-09 ENCOUNTER — Ambulatory Visit (HOSPITAL_COMMUNITY)
Admission: RE | Admit: 2018-12-09 | Discharge: 2018-12-09 | Disposition: A | Discharge status: Home / Self Care | Payer: Medicare Other | Attending: Interventional Cardiology | Admitting: Interventional Cardiology

## 2018-12-09 ENCOUNTER — Ambulatory Visit: Payer: Medicare Other | Admitting: Physician Assistant

## 2018-12-09 ENCOUNTER — Other Ambulatory Visit: Payer: Self-pay

## 2018-12-09 ENCOUNTER — Encounter (HOSPITAL_COMMUNITY)
Admission: RE | Disposition: A | Discharge status: Home / Self Care | Payer: Self-pay | Source: Home / Self Care | Attending: Interventional Cardiology

## 2018-12-09 DIAGNOSIS — I5030 Unspecified diastolic (congestive) heart failure: Secondary | ICD-10-CM | POA: Insufficient documentation

## 2018-12-09 DIAGNOSIS — E209 Hypoparathyroidism, unspecified: Secondary | ICD-10-CM | POA: Diagnosis not present

## 2018-12-09 DIAGNOSIS — Z79899 Other long term (current) drug therapy: Secondary | ICD-10-CM | POA: Diagnosis not present

## 2018-12-09 DIAGNOSIS — I2584 Coronary atherosclerosis due to calcified coronary lesion: Secondary | ICD-10-CM | POA: Diagnosis not present

## 2018-12-09 DIAGNOSIS — E785 Hyperlipidemia, unspecified: Secondary | ICD-10-CM | POA: Diagnosis not present

## 2018-12-09 DIAGNOSIS — Z7902 Long term (current) use of antithrombotics/antiplatelets: Secondary | ICD-10-CM | POA: Insufficient documentation

## 2018-12-09 DIAGNOSIS — Z79890 Hormone replacement therapy (postmenopausal): Secondary | ICD-10-CM | POA: Diagnosis not present

## 2018-12-09 DIAGNOSIS — K219 Gastro-esophageal reflux disease without esophagitis: Secondary | ICD-10-CM | POA: Insufficient documentation

## 2018-12-09 DIAGNOSIS — Z8349 Family history of other endocrine, nutritional and metabolic diseases: Secondary | ICD-10-CM | POA: Diagnosis not present

## 2018-12-09 DIAGNOSIS — Z955 Presence of coronary angioplasty implant and graft: Secondary | ICD-10-CM | POA: Insufficient documentation

## 2018-12-09 DIAGNOSIS — I252 Old myocardial infarction: Secondary | ICD-10-CM | POA: Insufficient documentation

## 2018-12-09 DIAGNOSIS — E669 Obesity, unspecified: Secondary | ICD-10-CM | POA: Insufficient documentation

## 2018-12-09 DIAGNOSIS — Z7982 Long term (current) use of aspirin: Secondary | ICD-10-CM | POA: Diagnosis not present

## 2018-12-09 DIAGNOSIS — I110 Hypertensive heart disease with heart failure: Secondary | ICD-10-CM | POA: Diagnosis not present

## 2018-12-09 DIAGNOSIS — Z6834 Body mass index (BMI) 34.0-34.9, adult: Secondary | ICD-10-CM | POA: Diagnosis not present

## 2018-12-09 DIAGNOSIS — Z888 Allergy status to other drugs, medicaments and biological substances status: Secondary | ICD-10-CM | POA: Insufficient documentation

## 2018-12-09 DIAGNOSIS — Z8249 Family history of ischemic heart disease and other diseases of the circulatory system: Secondary | ICD-10-CM | POA: Insufficient documentation

## 2018-12-09 DIAGNOSIS — I25118 Atherosclerotic heart disease of native coronary artery with other forms of angina pectoris: Secondary | ICD-10-CM

## 2018-12-09 HISTORY — PX: LEFT HEART CATH AND CORONARY ANGIOGRAPHY: CATH118249

## 2018-12-09 SURGERY — LEFT HEART CATH AND CORONARY ANGIOGRAPHY
Anesthesia: LOCAL

## 2018-12-09 MED ORDER — SODIUM CHLORIDE 0.9% FLUSH: 3.0000 mL | INTRAVENOUS | Status: DC | PRN

## 2018-12-09 MED ORDER — SODIUM CHLORIDE 0.9 % IV SOLN: 250.0000 mL | INTRAVENOUS | Status: DC | PRN

## 2018-12-09 MED ORDER — FENTANYL CITRATE (PF) 100 MCG/2ML IJ SOLN
INTRAMUSCULAR | Status: DC | PRN
  Administered 2018-12-09 (×2): 25 ug via INTRAVENOUS

## 2018-12-09 MED ORDER — LIDOCAINE HCL (PF) 1 % IJ SOLN
INTRAMUSCULAR | Status: AC
  Filled 2018-12-09: qty 30

## 2018-12-09 MED ORDER — HEPARIN SODIUM (PORCINE) 1000 UNIT/ML IJ SOLN
INTRAMUSCULAR | Status: AC
  Filled 2018-12-09: qty 1

## 2018-12-09 MED ORDER — MIDAZOLAM HCL 2 MG/2ML IJ SOLN
INTRAMUSCULAR | Status: DC | PRN
  Administered 2018-12-09: 1 mg via INTRAVENOUS
  Administered 2018-12-09: 2 mg via INTRAVENOUS

## 2018-12-09 MED ORDER — LIDOCAINE HCL (PF) 1 % IJ SOLN
INTRAMUSCULAR | Status: DC | PRN
  Administered 2018-12-09: 2 mL

## 2018-12-09 MED ORDER — HEPARIN SODIUM (PORCINE) 1000 UNIT/ML IJ SOLN
INTRAMUSCULAR | Status: DC | PRN
  Administered 2018-12-09: 4500 [IU] via INTRAVENOUS

## 2018-12-09 MED ORDER — SODIUM CHLORIDE 0.9% FLUSH: 3.0000 mL | Freq: Two times a day (BID) | INTRAVENOUS | Status: DC

## 2018-12-09 MED ORDER — FENTANYL CITRATE (PF) 100 MCG/2ML IJ SOLN
INTRAMUSCULAR | Status: AC
  Filled 2018-12-09: qty 2

## 2018-12-09 MED ORDER — SODIUM CHLORIDE 0.9 % WEIGHT BASED INFUSION
3.0000 mL/kg/h | INTRAVENOUS | Status: AC
  Administered 2018-12-09: 3 mL/kg/h via INTRAVENOUS

## 2018-12-09 MED ORDER — IOHEXOL 350 MG/ML SOLN
INTRAVENOUS | Status: DC | PRN
  Administered 2018-12-09: 65 mL

## 2018-12-09 MED ORDER — ONDANSETRON HCL 4 MG/2ML IJ SOLN: 4.0000 mg | Freq: Four times a day (QID) | INTRAMUSCULAR | Status: DC | PRN

## 2018-12-09 MED ORDER — SODIUM CHLORIDE 0.9 % WEIGHT BASED INFUSION: 1.0000 mL/kg/h | INTRAVENOUS | Status: DC

## 2018-12-09 MED ORDER — ASPIRIN 81 MG PO CHEW: 81.0000 mg | CHEWABLE_TABLET | ORAL | Status: DC

## 2018-12-09 MED ORDER — HEPARIN (PORCINE) IN NACL 1000-0.9 UT/500ML-% IV SOLN
INTRAVENOUS | Status: AC
  Filled 2018-12-09: qty 1000

## 2018-12-09 MED ORDER — VERAPAMIL HCL 2.5 MG/ML IV SOLN
INTRAVENOUS | Status: DC | PRN
  Administered 2018-12-09: 10 mL via INTRA_ARTERIAL

## 2018-12-09 MED ORDER — CLOPIDOGREL BISULFATE 75 MG PO TABS: 75.0000 mg | ORAL_TABLET | ORAL | Status: DC

## 2018-12-09 MED ORDER — MIDAZOLAM HCL 2 MG/2ML IJ SOLN
INTRAMUSCULAR | Status: AC
  Filled 2018-12-09: qty 2

## 2018-12-09 MED ORDER — SODIUM CHLORIDE 0.9 % IV SOLN: INTRAVENOUS | Status: AC

## 2018-12-09 MED ORDER — ACETAMINOPHEN 325 MG PO TABS: 650.0000 mg | ORAL_TABLET | ORAL | Status: DC | PRN

## 2018-12-09 MED ORDER — VERAPAMIL HCL 2.5 MG/ML IV SOLN
INTRAVENOUS | Status: AC
  Filled 2018-12-09: qty 2

## 2018-12-09 MED ORDER — HEPARIN (PORCINE) IN NACL 1000-0.9 UT/500ML-% IV SOLN
INTRAVENOUS | Status: DC | PRN
  Administered 2018-12-09 (×2): 500 mL

## 2018-12-09 SURGICAL SUPPLY — 9 items
CATH 5FR JL3.5 JR4 ANG PIG MP (CATHETERS) ×2 IMPLANT
DEVICE RAD COMP TR BAND LRG (VASCULAR PRODUCTS) ×1 IMPLANT
GLIDESHEATH SLEND SS 6F .021 (SHEATH) ×1 IMPLANT
GUIDEWIRE INQWIRE 1.5J.035X260 (WIRE) IMPLANT
INQWIRE 1.5J .035X260CM (WIRE) ×2
KIT HEART LEFT (KITS) ×2 IMPLANT
PACK CARDIAC CATHETERIZATION (CUSTOM PROCEDURE TRAY) ×2 IMPLANT
TRANSDUCER W/STOPCOCK (MISCELLANEOUS) ×2 IMPLANT
TUBING CIL FLEX 10 FLL-RA (TUBING) ×2 IMPLANT

## 2018-12-09 NOTE — Interval H&P Note (Signed)
Cath Lab Visit (complete for each Cath Lab visit)  Clinical Evaluation Leading to the Procedure:   ACS: No.  Non-ACS:    Anginal Classification: CCS III  Anti-ischemic medical therapy: Minimal Therapy (1 class of medications)  Non-Invasive Test Results: No non-invasive testing performed  Prior CABG: No previous CABG      History and Physical Interval Note:  12/09/2018 1:51 PM  [name]  has presented today for surgery, with the diagnosis of sob  The various methods of treatment have been discussed with the patient and family. After consideration of risks, benefits and other options for treatment, the patient has consented to  Procedure(s): LEFT HEART CATH AND CORONARY ANGIOGRAPHY (N/A) as a surgical intervention .  The patient's history has been reviewed, patient examined, no change in status, stable for surgery.  I have reviewed the patient's chart and labs.  Questions were answered to the patient's satisfaction.     Larae Grooms

## 2018-12-09 NOTE — Progress Notes (Signed)
Pt states that her husband was not discharged from the hospital today and she has no one to stay with her tonight.  She is requesting to be admitted so that she can go stay with her husband in his room.  Pt states that she has no other person  to stay with her and no other person to take her home.  I explained to pt that she could not be discharged to her husbands room.  She  Denies having any possible solution.  Dr Irish Lack notified and plans to  admit pt overnight for obs.  When the pt found out that she would be in a different room then her husband she called her son who agreed to come pick her up and take her home and stay with her for 24 hours.  Dr Irish Lack notified and cancelled plan to admit her.

## 2018-12-09 NOTE — Progress Notes (Signed)
Pt's son present for discharge instructions. Both pt and son verbalize understanding that pt is to go home with 24 hour supervision.

## 2018-12-09 NOTE — Discharge Instructions (Signed)
Drink plenty of fluids. Keep right arm at or above heart level,  Radial Site Care Refer to this sheet in the next few weeks. These instructions provide you with information about caring for yourself after your procedure. Your health care provider may also give you more specific instructions. Your treatment has been planned according to current medical practices, but problems sometimes occur. Call your health care provider if you have any problems or questions after your procedure. What can I expect after the procedure? After your procedure, it is typical to have the following:  Bruising at the radial site that usually fades within 1-2 weeks.  Blood collecting in the tissue (hematoma) that may be painful to the touch. It should usually decrease in size and tenderness within 1-2 weeks.  Follow these instructions at home:  Take medicines only as directed by your health care provider.  You may shower 24-48 hours after the procedure or as directed by your health care provider. Remove the bandage (dressing) and gently wash the site with plain soap and water. Pat the area dry with a clean towel. Do not rub the site, because this may cause bleeding.  Do not take baths, swim, or use a hot tub until your health care provider approves.  Check your insertion site every day for redness, swelling, or drainage.  Do not apply powder or lotion to the site.  Do not flex or bend the affected arm for 24 hours or as directed by your health care provider.  Do not push or pull heavy objects with the affected arm for 24 hours or as directed by your health care provider.  Do not lift over 10 lb (4.5 kg) for 5 days after your procedure or as directed by your health care provider.  Ask your health care provider when it is okay to: ? Return to work or school. ? Resume usual physical activities or sports. ? Resume sexual activity.  Do not drive home if you are discharged the same day as the procedure. Have  someone else drive you.  You may drive 24 hours after the procedure unless otherwise instructed by your health care provider.  Do not operate machinery or power tools for 24 hours after the procedure.  If your procedure was done as an outpatient procedure, which means that you went home the same day as your procedure, a responsible adult should be with you for the first 24 hours after you arrive home.  Keep all follow-up visits as directed by your health care provider. This is important. Contact a health care provider if:  You have a fever.  You have chills.  You have increased bleeding from the radial site. Hold pressure on the site. Get help right away if:  You have unusual pain at the radial site.  You have redness, warmth, or swelling at the radial site.  You have drainage (other than a small amount of blood on the dressing) from the radial site.  The radial site is bleeding, and the bleeding does not stop after 30 minutes of holding steady pressure on the site.  Your arm or hand becomes pale, cool, tingly, or numb. This information is not intended to replace advice given to you by your health care provider. Make sure you discuss any questions you have with your health care provider. Document Released: 01/12/2011 Document Revised: 05/17/2016 Document Reviewed: 06/28/2014 Elsevier Interactive Patient Education  2018 [employer].

## 2018-12-10 ENCOUNTER — Encounter (HOSPITAL_COMMUNITY): Payer: Self-pay | Admitting: Interventional Cardiology

## 2018-12-11 ENCOUNTER — Telehealth: Payer: Self-pay | Admitting: Internal Medicine

## 2018-12-11 DIAGNOSIS — R5383 Other fatigue: Secondary | ICD-10-CM

## 2018-12-11 DIAGNOSIS — E039 Hypothyroidism, unspecified: Secondary | ICD-10-CM

## 2018-12-11 NOTE — Telephone Encounter (Signed)
Called pt to review cath Stable CAD since last cath in 2017  ? Why more fatigue  Would check TSH, free T3, free T4  If normal then she should discuss with Dr Estanislado Pandy

## 2018-12-12 ENCOUNTER — Other Ambulatory Visit: Payer: Medicare Other

## 2018-12-12 DIAGNOSIS — E039 Hypothyroidism, unspecified: Secondary | ICD-10-CM

## 2018-12-12 DIAGNOSIS — R5383 Other fatigue: Secondary | ICD-10-CM | POA: Diagnosis not present

## 2018-12-12 LAB — TSH: TSH: 7.92 u[IU]/mL — ABNORMAL HIGH (ref 0.450–4.500)

## 2018-12-12 LAB — T4, FREE: Free T4: 1.53 ng/dL (ref 0.82–1.77)

## 2018-12-12 LAB — T3, FREE: T3, Free: 2.4 pg/mL (ref 2.0–4.4)

## 2018-12-12 NOTE — Addendum Note (Signed)
Addended by: Rodman Key on: 12/12/2018 08:54 AM   Modules accepted: Orders

## 2018-12-12 NOTE — Telephone Encounter (Signed)
Called patient. She is planning to come today for lab work. Placed orders for TSH, free T3, free T4

## 2018-12-19 DIAGNOSIS — M170 Bilateral primary osteoarthritis of knee: Secondary | ICD-10-CM | POA: Insufficient documentation

## 2018-12-19 DIAGNOSIS — M51369 Other intervertebral disc degeneration, lumbar region without mention of lumbar back pain or lower extremity pain: Secondary | ICD-10-CM | POA: Insufficient documentation

## 2018-12-19 DIAGNOSIS — M5136 Other intervertebral disc degeneration, lumbar region: Secondary | ICD-10-CM | POA: Insufficient documentation

## 2018-12-19 NOTE — Progress Notes (Signed)
Office Visit Note  Patient: [name]             Date of Birth: Dec 27, 1945           MRN: [id number]             PCP: Mosie Lukes, MD Referring: Mosie Lukes, MD Visit Date: 01/01/2019 Occupation: @GUAROCC @  Subjective:  Pain in multiple joints.   History of Present Illness: [name] is a 72 y.o. female returns for follow-up visit today.  She states she has been experiencing discomfort in her cervical spine which goes into her head.  She has been also experiencing pain in her bilateral hands and bilateral knee joints.  Denies any joint swelling.  Lower back pain persist.  Activities of Daily Living:  Patient reports morning stiffness for 0 minute.   Patient Reports nocturnal pain.  Difficulty dressing/grooming: Denies Difficulty climbing stairs: Denies Difficulty getting out of chair: Denies Difficulty using hands for taps, buttons, cutlery, and/or writing: Reports  Review of Systems  Constitutional: Negative for fatigue, night sweats, weight gain and weight loss.  HENT: Negative for mouth sores, trouble swallowing, trouble swallowing, mouth dryness and nose dryness.   Eyes: Negative for pain, redness, visual disturbance and dryness.  Respiratory: Negative for cough, shortness of breath and difficulty breathing.   Cardiovascular: Negative for chest pain, palpitations, hypertension, irregular heartbeat and swelling in legs/feet.  Gastrointestinal: Negative for blood in stool, constipation and diarrhea.  Endocrine: Negative for increased urination.  Genitourinary: Negative for vaginal dryness.  Musculoskeletal: Positive for arthralgias, joint pain and morning stiffness. Negative for joint swelling, myalgias, muscle weakness, muscle tenderness and myalgias.  Skin: Negative for color change, rash, hair loss, skin tightness, ulcers and sensitivity to sunlight.  Allergic/Immunologic: Negative for susceptible to infections.  Neurological: Negative for dizziness,  memory loss, night sweats and weakness.  Hematological: Negative for swollen glands.  Psychiatric/Behavioral: Negative for depressed mood and sleep disturbance. The patient is not nervous/anxious.     PMFS History:  Patient Active Problem List   Diagnosis Date Noted  . Primary osteoarthritis of both knees 12/19/2018  . DDD (degenerative disc disease), lumbar 12/19/2018  . ANA positive 10/27/2018  . CAP (community acquired pneumonia) 03/06/2018  . Lower extremity pain, lateral, right 02/17/2018  . Post-menopausal bleeding 02/17/2018  . Cephalalgia 02/17/2018  . Anxiety 02/17/2018  . Peripheral arterial disease ([place]) 02/20/2017  . Facial skin lesion 01/28/2017  . Benign paroxysmal positional vertigo 08/05/2016  . Atypical chest pain 03/27/2016  . Esophageal reflux 03/27/2016  . Antiplatelet or antithrombotic long-term use 03/27/2016  . Pain in right axilla 03/13/2016  . Chest pain 03/07/2016  . Right lumbar radiculopathy 02/21/2016  . Abnormality of gait 02/21/2016  . Zinc deficiency 11/26/2015  . Chronic low back pain 10/26/2015  . Right hip pain 10/26/2015  . Medicare annual wellness visit, subsequent 03/13/2015  . Preventative health care 03/13/2015  . Sun-damaged skin 12/05/2014  . Angina of effort ([place]) 12/02/2014  . Hx of colonic polyps 09/28/2014  . Benign neoplasm of descending colon 09/28/2014  . Other fatigue 09/10/2014  . Personal history of colonic polyps 07/16/2014  . Bilateral hand pain 03/20/2014  . Postsurgical hypoparathyroidism ([place]) 08/03/2013  . Neck pain on right side 07/13/2013  . Constipation 05/31/2013  . Back pain 05/31/2013  . Nocturia 05/31/2013  . GERD (gastroesophageal reflux disease) 11/02/2012  . Tension headache 11/02/2012  . Anemia 06/11/2012  . Hypokalemia 06/11/2012  . Depression 01/21/2012  .  Headache(784.0) 12/23/2011  . Bradycardia 12/07/2011  . Hypocalcemia 12/07/2011  . Acute MI, subendocardial ([place]) 11/20/2011  .  Hypothyroidism 05/20/2009  . Essential hypertension, benign 05/20/2009  . Hyperlipidemia 03/08/2009  . CAD, NATIVE VESSEL 03/08/2009    Past Medical History:  Diagnosis Date  . Acute MI, subendocardial ([place])   . Back pain 05/31/2013  . Benign paroxysmal positional vertigo 08/05/2016  . CAD (coronary artery disease)    a. NSTEMI 10/2011: [place] 11/19/11: pLAD 30%, oOM 60%, AVCFX 30%, CFX after OM2 30%, pRCA 60 and 70%, then 99%, AM 80-90% with TIMI 3 flow.  PCI: Promus DES x 2 to RCA; b. 06/2012 Cath: patent RCA stents w/ subtl occl of Acute Marginal (jailed)->Med rx; c. 05/2015 MV: EF 59%, mod mid infsept/inf/ap lat/ap infarct with peri-infarct isch-->Med Rx; d. 02/2016 Cath: LM nl, LAD 64m, RI 50, RCA patent stents.  . Colitis   . Facial skin lesion 01/28/2017  . GERD (gastroesophageal reflux disease)    occasional  . Hyperlipidemia   . Hypertension   . Hypertensive heart disease    a. Echocardiogram 11/19/11: Difficult acoustic windows, EF 60-65%, normal LV wall thickness, grade 1 diastolic dysfunction  . Hypoparathyroidism ([place])   . Low zinc level 11/26/2016  . Multinodular thyroid    Goiter s/p thyroidectomy in 2007 with post-op  hypocalcemia and post-op hypothyroidism/hypoparathyroidism with hypocalcemia  . Neck pain on right side 07/13/2013  . Nocturia 05/31/2013  . Pain in right axilla 03/13/2016  . Palpitations    a. 03/2012 - patient set up for event monitor but did not wear correctly -  she declined wearing a repeat monitor  . Post-surgical hypothyroidism   . Sun-damaged skin 12/05/2014  . Tubular adenoma of colon 06/2011  . Unspecified constipation 05/31/2013  . Vertigo   . Zinc deficiency 11/26/2015    Family History  Problem Relation Age of Onset  . Colon cancer Brother   . Cancer Brother        COLON  . Hypertension Father   . Heart disease Father   . Heart attack Father   . Blindness Sister   . Congestive Heart Failure Sister   . Hypertension Sister   . Healthy Daughter   .  Healthy Son   . Thyroid disease Sister   . Cancer Brother        multiple myelomas  . Healthy Daughter   . Healthy Daughter   . Diabetes Neg Hx   . Prostate cancer Neg Hx   . Breast cancer Neg Hx    Past Surgical History:  Procedure Laterality Date  . CARDIAC CATHETERIZATION N/A 03/08/2016   Procedure: Left Heart Cath and Coronary Angiography;  Surgeon: Jettie Booze, MD;  Location: [facility];  Service: Cardiovascular;  Laterality: N/A;  . COLONOSCOPY  Aenomatous polyps   07/05/2011  . COLONOSCOPY N/A 09/28/2014   Procedure: COLONOSCOPY;  Surgeon: Ladene Artist, MD;  Location: [facility];  Service: Endoscopy;  Laterality: N/A;  . CORONARY ANGIOPLASTY WITH STENT PLACEMENT    . LEFT HEART CATH AND CORONARY ANGIOGRAPHY N/A 12/09/2018   Procedure: LEFT HEART CATH AND CORONARY ANGIOGRAPHY;  Surgeon: Jettie Booze, MD;  Location: [facility];  Service: Cardiovascular;  Laterality: N/A;  . LEFT HEART CATHETERIZATION WITH CORONARY ANGIOGRAM N/A 07/17/2012   Procedure: LEFT HEART CATHETERIZATION WITH CORONARY ANGIOGRAM;  Surgeon: Hillary Bow, MD;  Location: [facility];  Service: Cardiovascular;  Laterality: N/A;  . PERCUTANEOUS CORONARY STENT INTERVENTION (PCI-S)  N/A 11/19/2011   Procedure: PERCUTANEOUS CORONARY STENT INTERVENTION (PCI-S);  Surgeon: Hillary Bow, MD;  Location: [facility] CATH LAB;  Service: Cardiovascular;  Laterality: N/A;  . SHOULDER ARTHROSCOPY W/ ROTATOR CUFF REPAIR     right  . TOTAL THYROIDECTOMY  2007   GOITER   Social History   Social History Narrative   Lives at home alone.  Her son lives near her.   Right-handed.   1 cup coffee per day.    Objective: Vital Signs: BP 132/78 (BP Location: Left Arm, Patient Position: Sitting, Cuff Size: Normal)   Pulse 70   Resp 13   Ht 5\' 4"  (1.626 m)   Wt 202 lb 12.8 oz (92 kg)   BMI 34.81 kg/m    Physical Exam Vitals signs and nursing note reviewed.  Constitutional:      Appearance:  She is well-developed.  HENT:     Head: Normocephalic and atraumatic.  Eyes:     Conjunctiva/sclera: Conjunctivae normal.  Neck:     Musculoskeletal: Normal range of motion.  Cardiovascular:     Rate and Rhythm: Normal rate and regular rhythm.     Heart sounds: Normal heart sounds.  Pulmonary:     Effort: Pulmonary effort is normal.     Breath sounds: Normal breath sounds.  Abdominal:     General: Bowel sounds are normal.     Palpations: Abdomen is soft.  Lymphadenopathy:     Cervical: No cervical adenopathy.  Skin:    General: Skin is warm and dry.     Capillary Refill: Capillary refill takes less than 2 seconds.  Neurological:     Mental Status: She is alert and oriented to person, place, and time.  Psychiatric:        Behavior: Behavior normal.      Musculoskeletal Exam: Patient has discomfort range of motion of her cervical spine.  She has discomfort with range of motion of lumbar spine.  Shoulder joints elbow joints wrist joints with good range of motion.  She has DIP and PIP thickening with no synovitis.  Hip joints knee joints ankles with good range of motion.  No synovitis was noted.  CDAI Exam: CDAI Score: Not documented Patient Global Assessment: Not documented; Provider Global Assessment: Not documented Swollen: Not documented; Tender: Not documented Joint Exam   Not documented   There is currently no information documented on the homunculus. Go to the Rheumatology activity and complete the homunculus joint exam.  Investigation: No additional findings.  Imaging: Xr Cervical Spine 2 Or 3 Views  Result Date: 01/01/2019 Multilevel spondylosis was noted.  C5-6 severe narrowing was noted.  Facet joint arthropathy was noted. Impression: These findings are consistent with multilevel spondylosis with C5-6 severe narrowing and facet joint arthropathy.   Recent Labs: Lab Results  Component Value Date   WBC 9.0 12/05/2018   HGB 14.2 12/05/2018   PLT 299 12/05/2018    NA 144 12/05/2018   K 4.1 12/05/2018   CL 97 12/05/2018   CO2 27 12/05/2018   GLUCOSE 91 12/05/2018   BUN 21 12/05/2018   CREATININE 1.04 (H) 12/05/2018   BILITOT 0.6 11/18/2018   ALKPHOS 83 03/14/2018   AST 18 11/18/2018   ALT 18 11/18/2018   PROT 7.6 11/18/2018   ALBUMIN 4.0 03/14/2018   CALCIUM 8.1 (L) 12/05/2018   GFRAA 62 12/05/2018  November 18, 2018 Lupus anticoagulant negative, beta-2 IgM 32, anticardiolipin IgM 17, C3-C4 normal, RNP positive at 2.7 rest of the ENA negative.  Anti-CCP negative, 14 3 3  eta negative, December 20, 2018 TSH elevated at 7.920  Speciality Comments: No specialty comments available.  Procedures:  No procedures performed Allergies: Zetia [ezetimibe]   Assessment / Plan:     Visit Diagnoses: Positive ANA (antinuclear antibody) - History of inflammatory arthritis, ANA 1: 80 speckled, positive RNP, low titer anticardiolipin and beta-2 GP 1.  She does not have any clinical features of autoimmune disease.  Review of system was negative.  I did not see any synovitis today or on previous examination.  I made her aware of autoimmune disease features.  If she develops any new symptoms she is supposed to notify me.  Pain in neck -she has been experiencing a lot of pain and discomfort in her cervical spine.  She states the pain radiates to her scalp at times.  She has difficulty with rotation of her cervical spine.  She states Tylenol has not been effective.  Have given her prescription for methocarbamol 500 mg p.o. twice daily as needed.  Total 60 tablets with no refills were given.  Patient was advised not to drive in case she takes Robaxin during the daytime.  Side effects were discussed.  Plan: XR Cervical Spine 2 or 3 views.  The x-ray of the lumbar spine showed multilevel spondylosis and C5-6 narrowing.  I have given patient a handout on neck exercises and also referred her to physical therapy.  Pain in both hands - Patient gives history of intermittent  swelling.  I do not see any swelling or synovitis on examination.  Joint protection muscle strengthening was discussed..  Primary osteoarthritis of both knees - Bilateral moderate with moderate chondromalacia patella.  She has chronic discomfort in her knee joints.  DDD (degenerative disc disease), lumbar-chronic pain  Postoperative hypothyroidism -her TSH was elevated.  She will follow-up with her PCP.  She has multiple medical problems which are described below:  Peripheral arterial disease ([place])  Essential hypertension, benign  History of MI (myocardial infarction)  History of coronary artery disease  History of hypothyroidism  History of gastroesophageal reflux (GERD)  History of hyperlipidemia  Anxiety  Orders: Orders Placed This Encounter  Procedures  . XR Cervical Spine 2 or 3 views  . Ambulatory referral to Physical Therapy   Meds ordered this encounter  Medications  . methocarbamol (ROBAXIN) 500 MG tablet    Sig: Take 1 tablet (500 mg total) by mouth 2 (two) times daily.    Dispense:  60 tablet    Refill:  0    Face-to-face time spent with patient was 30 minutes. Greater than 50% of time was spent in counseling and coordination of care.  Follow-Up Instructions: Return in about 6 months (around 07/02/2019) for Autoimmune disease, Osteoarthritis.   Bo Merino, MD  Note - This record has been created using Editor, commissioning.  Chart creation errors have been sought, but may not always  have been located. Such creation errors do not reflect on  the standard of medical care.

## 2018-12-27 NOTE — Telephone Encounter (Signed)
Error

## 2019-01-01 ENCOUNTER — Ambulatory Visit (INDEPENDENT_AMBULATORY_CARE_PROVIDER_SITE_OTHER): Payer: Self-pay

## 2019-01-01 ENCOUNTER — Ambulatory Visit (INDEPENDENT_AMBULATORY_CARE_PROVIDER_SITE_OTHER): Payer: Medicare Other | Admitting: Rheumatology

## 2019-01-01 ENCOUNTER — Encounter: Payer: Self-pay | Admitting: Rheumatology

## 2019-01-01 VITALS — BP 132/78 | HR 70 | Resp 13 | Ht 64.0 in | Wt 202.8 lb

## 2019-01-01 DIAGNOSIS — M79641 Pain in right hand: Secondary | ICD-10-CM | POA: Diagnosis not present

## 2019-01-01 DIAGNOSIS — R768 Other specified abnormal immunological findings in serum: Secondary | ICD-10-CM | POA: Diagnosis not present

## 2019-01-01 DIAGNOSIS — M170 Bilateral primary osteoarthritis of knee: Secondary | ICD-10-CM | POA: Diagnosis not present

## 2019-01-01 DIAGNOSIS — M542 Cervicalgia: Secondary | ICD-10-CM

## 2019-01-01 DIAGNOSIS — E890 Postprocedural hypothyroidism: Secondary | ICD-10-CM

## 2019-01-01 DIAGNOSIS — M79642 Pain in left hand: Secondary | ICD-10-CM

## 2019-01-01 DIAGNOSIS — M5136 Other intervertebral disc degeneration, lumbar region: Secondary | ICD-10-CM | POA: Diagnosis not present

## 2019-01-01 MED ORDER — METHOCARBAMOL 500 MG PO TABS: 500.0000 mg | ORAL_TABLET | Freq: Two times a day (BID) | ORAL | 0 refills | Status: DC

## 2019-01-01 NOTE — Patient Instructions (Signed)
Cervical Strain and Sprain Rehab Ask your health care provider which exercises are safe for you. Do exercises exactly as told by your health care provider and adjust them as directed. It is normal to feel mild stretching, pulling, tightness, or discomfort as you do these exercises, but you should stop right away if you feel sudden pain or your pain gets worse.Do not begin these exercises until told by your health care provider. Stretching and range of motion exercises These exercises warm up your muscles and joints and improve the movement and flexibility of your neck. These exercises also help to relieve pain, numbness, and tingling. Exercise A: Cervical side bend  1. Using good posture, sit on a stable chair or stand up. 2. Without moving your shoulders, slowly tilt your left / right ear to your shoulder until you feel a stretch in your neck muscles. You should be looking straight ahead. 3. Hold for __________ seconds. 4. Repeat with the other side of your neck. Repeat __________ times. Complete this exercise __________ times a day. Exercise B: Cervical rotation  1. Using good posture, sit on a stable chair or stand up. 2. Slowly turn your head to the side as if you are looking over your left / right shoulder. ? Keep your eyes level with the ground. ? Stop when you feel a stretch along the side and the back of your neck. 3. Hold for __________ seconds. 4. Repeat this by turning to your other side. Repeat __________ times. Complete this exercise __________ times a day. Exercise C: Thoracic extension and pectoral stretch 1. Roll a towel or a small blanket so it is about 4 inches (10 cm) in diameter. 2. Lie down on your back on a firm surface. 3. Put the towel lengthwise, under your spine in the middle of your back. It should not be not under your shoulder blades. The towel should line up with your spine from your middle back to your lower back. 4. Put your hands behind your head and let your  elbows fall out to your sides. 5. Hold for __________ seconds. Repeat __________ times. Complete this exercise __________ times a day. Strengthening exercises These exercises build strength and endurance in your neck. Endurance is the ability to use your muscles for a long time, even after your muscles get tired. Exercise D: Upper cervical flexion, isometric 1. Lie on your back with a thin pillow behind your head and a small rolled-up towel under your neck. 2. Gently tuck your chin toward your chest and nod your head down to look toward your feet. Do not lift your head off the pillow. 3. Hold for __________ seconds. 4. Release the tension slowly. Relax your neck muscles completely before you repeat this exercise. Repeat __________ times. Complete this exercise __________ times a day. Exercise E: Cervical extension, isometric  1. Stand about 6 inches (15 cm) away from a wall, with your back facing the wall. 2. Place a soft object, about 6-8 inches (15-20 cm) in diameter, between the back of your head and the wall. A soft object could be a small pillow, a ball, or a folded towel. 3. Gently tilt your head back and press into the soft object. Keep your jaw and forehead relaxed. 4. Hold for __________ seconds. 5. Release the tension slowly. Relax your neck muscles completely before you repeat this exercise. Repeat __________ times. Complete this exercise __________ times a day. Posture and body mechanics Body mechanics refers to the movements and positions of your   body while you do your daily activities. Posture is part of body mechanics. Good posture and healthy body mechanics can help to relieve stress in your body's tissues and joints. Good posture means that your spine is in its natural S-curve position (your spine is neutral), your shoulders are pulled back slightly, and your head is not tipped forward. The following are general guidelines for applying improved posture and body mechanics to your  everyday activities. Standing   When standing, keep your spine neutral and keep your feet about hip-width apart. Keep a slight bend in your knees. Your ears, shoulders, and hips should line up.  When you do a task in which you stand in one place for a long time, place one foot up on a stable object that is 2-4 inches (5-10 cm) high, such as a footstool. This helps keep your spine neutral. Sitting   When sitting, keep your spine neutral and your keep feet flat on the floor. Use a footrest, if necessary, and keep your thighs parallel to the floor. Avoid rounding your shoulders, and avoid tilting your head forward.  When working at a desk or a computer, keep your desk at a height where your hands are slightly lower than your elbows. Slide your chair under your desk so you are close enough to maintain good posture.  When working at a computer, place your monitor at a height where you are looking straight ahead and you do not have to tilt your head forward or downward to look at the screen. Resting When lying down and resting, avoid positions that are most painful for you. Try to support your neck in a neutral position. You can use a contour pillow or a small rolled-up towel. Your pillow should support your neck but not push on it. This information is not intended to replace advice given to you by your health care provider. Make sure you discuss any questions you have with your health care provider. Document Released: 12/10/2005 Document Revised: 08/16/2016 Document Reviewed: 11/16/2015 Elsevier Interactive Patient Education  2019 [employer]  

## 2019-01-07 DIAGNOSIS — H25813 Combined forms of age-related cataract, bilateral: Secondary | ICD-10-CM | POA: Diagnosis not present

## 2019-01-07 DIAGNOSIS — H02831 Dermatochalasis of right upper eyelid: Secondary | ICD-10-CM | POA: Diagnosis not present

## 2019-01-07 DIAGNOSIS — H02834 Dermatochalasis of left upper eyelid: Secondary | ICD-10-CM | POA: Diagnosis not present

## 2019-01-07 DIAGNOSIS — H04123 Dry eye syndrome of bilateral lacrimal glands: Secondary | ICD-10-CM | POA: Diagnosis not present

## 2019-01-07 NOTE — Progress Notes (Addendum)
Subjective:   [name] is a 73 y.o. female who presents for Medicare Annual (Subsequent) preventive examination.  Review of Systems: No ROS.  Medicare Wellness Visit. Additional risk factors are reflected in the social history. Cardiac Risk Factors include: advanced age (>3men, >29 women);dyslipidemia;hypertension Sleep patterns: reports sleeps 3 hrs per night. States it has been like this for many years. States he feels rested. Reports she has discussed with PCP prior. Does not wish to take any meds. Home Safety/Smoke Alarms: Feels safe in home. Smoke alarms in place.  Lives in 1 story home with ex-husband. Step over tub with grab rail.  Female:       Mammo-  10/10/18     Dexa scan-   ordered     CCS-09/28/14. 5 yr recall Eye- Dr.Digby    Objective:     Vitals: BP 136/70 (BP Location: Left Arm, Patient Position: Sitting, Cuff Size: Large)   Pulse 75   Ht 5\' 4"  (1.626 m)   Wt 199 lb 12.8 oz (90.6 kg)   SpO2 96%   BMI 34.30 kg/m   Body mass index is 34.3 kg/m.  Advanced Directives 01/08/2019 12/09/2018 11/17/2018 03/06/2018 03/25/2017 06/28/2016 06/13/2016  Does Patient Have a Medical Advance Directive? No No No No No No No  Would patient like information on creating a medical advance directive? No - Patient declined No - Patient declined No - Patient declined No - Patient declined No - Patient declined Yes - Educational materials given -  Pre-existing out of facility DNR order (yellow form or pink MOST form) - - - - - - -    Tobacco Social History   Tobacco Use  Smoking Status Never Smoker  Smokeless Tobacco Never Used     Counseling given: Not Answered   Clinical Intake:     Pain : No/denies pain      Past Medical History:  Diagnosis Date  . Acute MI, subendocardial ([place])   . Arthritis   . Back pain 05/31/2013  . Benign paroxysmal positional vertigo 08/05/2016  . CAD (coronary artery disease)    a. NSTEMI 10/2011: [place] 11/19/11: pLAD 30%, oOM 60%, AVCFX  30%, CFX after OM2 30%, pRCA 60 and 70%, then 99%, AM 80-90% with TIMI 3 flow.  PCI: Promus DES x 2 to RCA; b. 06/2012 Cath: patent RCA stents w/ subtl occl of Acute Marginal (jailed)->Med rx; c. 05/2015 MV: EF 59%, mod mid infsept/inf/ap lat/ap infarct with peri-infarct isch-->Med Rx; d. 02/2016 Cath: LM nl, LAD 12m, RI 50, RCA patent stents.  . Colitis   . Facial skin lesion 01/28/2017  . GERD (gastroesophageal reflux disease)    occasional  . Hyperlipidemia   . Hypertension   . Hypertensive heart disease    a. Echocardiogram 11/19/11: Difficult acoustic windows, EF 60-65%, normal LV wall thickness, grade 1 diastolic dysfunction  . Hypoparathyroidism ([place])   . Low zinc level 11/26/2016  . Multinodular thyroid    Goiter s/p thyroidectomy in 2007 with post-op  hypocalcemia and post-op hypothyroidism/hypoparathyroidism with hypocalcemia  . Neck pain on right side 07/13/2013  . Nocturia 05/31/2013  . Pain in right axilla 03/13/2016  . Palpitations    a. 03/2012 - patient set up for event monitor but did not wear correctly -  she declined wearing a repeat monitor  . Post-surgical hypothyroidism   . Sun-damaged skin 12/05/2014  . Tubular adenoma of colon 06/2011  . Unspecified constipation 05/31/2013  . Vertigo   . Zinc deficiency 11/26/2015  Past Surgical History:  Procedure Laterality Date  . CARDIAC CATHETERIZATION N/A 03/08/2016   Procedure: Left Heart Cath and Coronary Angiography;  Surgeon: Jettie Booze, MD;  Location: [facility];  Service: Cardiovascular;  Laterality: N/A;  . COLONOSCOPY  Aenomatous polyps   07/05/2011  . COLONOSCOPY N/A 09/28/2014   Procedure: COLONOSCOPY;  Surgeon: Ladene Artist, MD;  Location: [facility];  Service: Endoscopy;  Laterality: N/A;  . CORONARY ANGIOPLASTY WITH STENT PLACEMENT    . LEFT HEART CATH AND CORONARY ANGIOGRAPHY N/A 12/09/2018   Procedure: LEFT HEART CATH AND CORONARY ANGIOGRAPHY;  Surgeon: Jettie Booze, MD;  Location: [facility];  Service: Cardiovascular;  Laterality: N/A;  . LEFT HEART CATHETERIZATION WITH CORONARY ANGIOGRAM N/A 07/17/2012   Procedure: LEFT HEART CATHETERIZATION WITH CORONARY ANGIOGRAM;  Surgeon: Hillary Bow, MD;  Location: [facility] C[facility];  Service: Cardiovascular;  Laterality: N/A;  . PERCUTANEOUS CORONARY STENT INTERVENTION (PCI-S) N/A 11/19/2011   Procedure: PERCUTANEOUS CORONARY STENT INTERVENTION (PCI-S);  Surgeon: Hillary Bow, MD;  Location: [facility] CATH LAB;  Service: Cardiovascular;  Laterality: N/A;  . SHOULDER ARTHROSCOPY W/ ROTATOR CUFF REPAIR     right  . TOTAL THYROIDECTOMY  2007   GOITER   Family History  Problem Relation Age of Onset  . Colon cancer Brother   . Cancer Brother        COLON  . Hypertension Father   . Heart disease Father   . Heart attack Father   . Blindness Sister   . Congestive Heart Failure Sister   . Hypertension Sister   . Healthy Daughter   . Healthy Son   . Thyroid disease Sister   . Cancer Brother        multiple myelomas  . Healthy Daughter   . Healthy Daughter   . Diabetes Neg Hx   . Prostate cancer Neg Hx   . Breast cancer Neg Hx    Social History   Socioeconomic History  . Marital status: Married    Spouse name: Not on file  . Number of children: 4  . Years of education: 69  . Highest education level: Not on file  Occupational History  . Occupation: Press photographer person at [employer]  . Financial resource strain: Not on file  . Food insecurity:    Worry: Not on file    Inability: Not on file  . Transportation needs:    Medical: Not on file    Non-medical: Not on file  Tobacco Use  . Smoking status: Never Smoker  . Smokeless tobacco: Never Used  Substance and Sexual Activity  . Alcohol use: Yes    Comment: rare once per month  . Drug use: No  . Sexual activity: Not Currently  Lifestyle  . Physical activity:    Days per week: Not on file    Minutes per session: Not on file  . Stress: Not on file    Relationships  . Social connections:    Talks on phone: Not on file    Gets together: Not on file    Attends religious service: Not on file    Active member of club or organization: Not on file    Attends meetings of clubs or organizations: Not on file    Relationship status: Not on file  Other Topics Concern  . Not on file  Social History Narrative   Lives at home alone.  Her son lives near her.   Right-handed.  1 cup coffee per day.    Outpatient Encounter Medications as of 01/08/2019  Medication Sig  . ALPRAZolam (XANAX) 0.25 MG tablet Take 1 tablet (0.25 mg total) by mouth 2 (two) times daily as needed for anxiety.  Marland Kitchen amLODipine (NORVASC) 10 MG tablet Take 1 tablet (10 mg total) by mouth daily.  Marland Kitchen aspirin EC 81 MG tablet Take 81 mg by mouth daily.    . B Complex-C (B-COMPLEX WITH VITAMIN C) tablet Take 1 tablet by mouth daily.  . calcitRIOL (ROCALTROL) 0.25 MCG capsule TAKE 3 CAPSULES(0.75 MCG) BY MOUTH DAILY  . carvedilol (COREG) 3.125 MG tablet Take 1 tablet (3.125 mg total) by mouth 2 (two) times daily.  . cholecalciferol (VITAMIN D) 1000 UNITS tablet Take 1,000 Units by mouth daily.  . clopidogrel (PLAVIX) 75 MG tablet Take 1 tablet (75 mg total) by mouth daily.  . furosemide (LASIX) 40 MG tablet Take 1 tablet (40 mg total) by mouth daily.  Marland Kitchen gabapentin (NEURONTIN) 300 MG capsule Take 1 capsule by mouth 3 (three) times daily as needed (nerve pain).   . hyoscyamine (LEVSIN SL) 0.125 MG SL tablet Place 1 tablet (0.125 mg total) under the tongue every 4 (four) hours as needed.  . isosorbide mononitrate (IMDUR) 60 MG 24 hr tablet Take 1 tablet (60 mg total) by mouth daily.  Marland Kitchen ketoconazole (NIZORAL) 2 % cream Apply 1 fingertip amount to each foot daily.  Marland Kitchen levothyroxine (SYNTHROID, LEVOTHROID) 100 MCG tablet TAKE 1 TABLET(100 MCG) BY MOUTH DAILY (Patient taking differently: Take 100 mcg by mouth every morning. )  . methocarbamol (ROBAXIN) 500 MG tablet Take 1 tablet (500 mg  total) by mouth 2 (two) times daily.  . Multiple Vitamin (MULTIVITAMIN WITH MINERALS) TABS tablet Take 1 tablet by mouth daily.  . pantoprazole (PROTONIX) 40 MG tablet Take 1 tablet (40 mg total) by mouth daily.  Marland Kitchen REPATHA SURECLICK 347 MG/ML SOAJ ADMINISTER 1 ML UNDER THE SKIN EVERY 14 DAYS (Patient taking differently: Inject 1 mL into the skin every 14 (fourteen) days. )  . rosuvastatin (CRESTOR) 40 MG tablet Take 1 tablet (40 mg total) by mouth daily.  . traMADol (ULTRAM) 50 MG tablet Take 1 tablet (50 mg total) by mouth every 8 (eight) hours as needed.  . methylPREDNISolone (MEDROL) 4 MG tablet 6 tabs by mouth once daily for 3 days, then 5 tabs daily x 3 days, then 4 tabs daily x 3 days, then 3 tabs daily x 3 days, then 2 tabs daily x 3 days, then 2 tabs daily x 3 days, then 1 tab daily x 3 days (Patient not taking: Reported on 01/01/2019)  . nitroGLYCERIN (NITROSTAT) 0.4 MG SL tablet DISSOLVE 1 TABLET UNDER THE TONGUE EVERY 5 MINUTES AS NEEDED FOR CHEST PAIN AS DIRECTED (Patient not taking: No sig reported)   No facility-administered encounter medications on file as of 01/08/2019.     Activities of Daily Living In your present state of health, do you have any difficulty performing the following activities: 01/08/2019 03/06/2018  Hearing? Y N  Comment pt reports she sees Dr. Wilburn Cornelia -  Vision? N N  Comment wears glasses. -  Difficulty concentrating or making decisions? N N  Comment reads news on phone. -  Walking or climbing stairs? Y N  Dressing or bathing? N N  Doing errands, shopping? N N  Preparing Food and eating ? N -  Using the Toilet? N -  In the past six months, have you accidently leaked urine? N -  Do you have problems with loss of bowel control? N -  Managing your Medications? N -  Managing your Finances? N -  Housekeeping or managing your Housekeeping? N -  Some recent data might be hidden    Patient Care Team: Mosie Lukes, MD as PCP - General (Family  Medicine) Fay Records, MD as PCP - Cardiology (Cardiology) Ladene Artist, MD as Consulting Physician (Gastroenterology) Fay Records, MD as Consulting Physician (Cardiology) Suella Broad, MD as Consulting Physician (Physical Medicine and Rehabilitation)    Assessment:   This is a routine wellness examination for Tarisa. Physical assessment deferred to PCP.  Exercise Activities and Dietary recommendations Current Exercise Habits: Home exercise routine, Type of exercise: treadmill, Time (Minutes): 30, Frequency (Times/Week): 2, Weekly Exercise (Minutes/Week): 60, Intensity: Mild, Exercise limited by: None identified   Diet (meal preparation, eat out, water intake, caffeinated beverages, dairy products, fruits and vegetables): on average, 1 meals per day plus snack. Eats healthy foods. Drinks water throughout the day.   Goals    . Garden again       Fall Risk Fall Risk  01/08/2019 03/14/2018 05/07/2016 03/07/2015  Falls in the past year? 1 No No No  Number falls in past yr: 0 - - -  Injury with Fall? 0 - - -    Depression Screen PHQ 2/9 Scores 01/08/2019 07/26/2016 05/07/2016 03/07/2015  PHQ - 2 Score 0 0 0 0  PHQ- 9 Score - 0 - -     Cognitive Function Ad8 score reviewed for issues:  Issues making decisions:no  Less interest in hobbies / activities:no  Repeats questions, stories (family complaining):no  Trouble using ordinary gadgets (microwave, computer, phone):no  Forgets the month or year: no  Mismanaging finances: no  Remembering appts:no  Daily problems with thinking and/or memory:no Ad8 score is=0        Immunization History  Administered Date(s) Administered  . Influenza,inj,Quad PF,6+ Mos 09/14/2013  . Pneumococcal Conjugate-13 09/12/2015  . Pneumococcal Polysaccharide-23 06/05/2012  . Tdap 12/27/2003, 03/15/2014    Screening Tests Health Maintenance  Topic Date Due  . Hepatitis C Screening  [birth year]  . URINE MICROALBUMIN  02/19/1957  .  COLONOSCOPY  09/28/2017  . PAP SMEAR-Modifier  04/05/2019  . MAMMOGRAM  10/10/2020  . TETANUS/TDAP  03/15/2024  . DEXA SCAN  Completed  . PNA vac Low Risk Adult  Completed  . INFLUENZA VACCINE  Discontinued      Plan:    Please schedule your next medicare wellness visit with me in 1 yr.  Continue to eat heart healthy diet (full of fruits, vegetables, whole grains, lean protein, water--limit salt, fat, and sugar intake) and increase physical activity as tolerated.  I have ordered your bone density scan. Please schedule downstairs.  Per your last report, your next colonoscopy with be due in October. Please contact Dr.Stark's office [phone]   I have personally reviewed and noted the following in the patient's chart:   . Medical and social history . Use of alcohol, tobacco or illicit drugs  . Current medications and supplements . Functional ability and status . Nutritional status . Physical activity . Advanced directives . List of other physicians . Hospitalizations, surgeries, and ER visits in previous 12 months . Vitals . Screenings to include cognitive, depression, and falls . Referrals and appointments  In addition, I have reviewed and discussed with patient certain preventive protocols, quality metrics, and best practice recommendations. A written personalized care plan for preventive services as  well as general preventive health recommendations were provided to patient.     Shela Nevin, [place]  01/08/2019  Medical screening examination/treatment was performed by qualified clinical staff member and as supervising physician I was immediately available for consultation/collaboration. I have reviewed documentation and agree with assessment and plan.  Penni Homans, MD

## 2019-01-08 ENCOUNTER — Encounter: Payer: Self-pay | Admitting: *Deleted

## 2019-01-08 ENCOUNTER — Ambulatory Visit (INDEPENDENT_AMBULATORY_CARE_PROVIDER_SITE_OTHER): Payer: Medicare Other | Admitting: *Deleted

## 2019-01-08 VITALS — BP 136/70 | HR 75 | Ht 64.0 in | Wt 199.8 lb

## 2019-01-08 DIAGNOSIS — Z0000 Encounter for general adult medical examination without abnormal findings: Secondary | ICD-10-CM | POA: Diagnosis not present

## 2019-01-08 DIAGNOSIS — Z780 Asymptomatic menopausal state: Secondary | ICD-10-CM

## 2019-01-08 NOTE — Patient Instructions (Signed)
Please schedule your next medicare wellness visit with me in 1 yr.  Continue to eat heart healthy diet (full of fruits, vegetables, whole grains, lean protein, water--limit salt, fat, and sugar intake) and increase physical activity as tolerated.  I have ordered your bone density scan. Please schedule downstairs.  Per your last report, your next colonoscopy with be due in October. Please contact Dr.Stark's office [phone]   [name] , Thank you for taking time to come for your Medicare Wellness Visit. I appreciate your ongoing commitment to your health goals. Please review the following plan we discussed and let me know if I can assist you in the future.   These are the goals we discussed: Goals    . Garden again       This is a list of the screening recommended for you and due dates:  Health Maintenance  Topic Date Due  .  Hepatitis C: One time screening is recommended by Center for Disease Control  (CDC) for  adults born from [birth year] through 1964.   1946/01/10  . Urine Protein Check  02[birth year]19[birth year]1957  . Colon Cancer Screening  09/28/2017  . Pap Smear  04/05/2019  . Mammogram  10/10/2020  . Tetanus Vaccine  03/15/2024  . DEXA scan (bone density measurement)  Completed  . Pneumonia vaccines  Completed  . Flu Shot  Discontinued    Health Maintenance After Age 54 After age 24, you are at a higher risk for certain long-term diseases and infections as well as injuries from falls. Falls are a major cause of broken bones and head injuries in people who are older than age 66. Getting regular preventive care can help to keep you healthy and well. Preventive care includes getting regular testing and making lifestyle changes as recommended by your health care provider. Talk with your health care provider about:  Which screenings and tests you should have. A screening is a test that checks for a disease when you have no symptoms.  A diet and exercise plan that is right for you. What  should I know about screenings and tests to prevent falls? Screening and testing are the best ways to find a health problem early. Early diagnosis and treatment give you the best chance of managing medical conditions that are common after age 64. Certain conditions and lifestyle choices may make you more likely to have a fall. Your health care provider may recommend:  Regular vision checks. Poor vision and conditions such as cataracts can make you more likely to have a fall. If you wear glasses, make sure to get your prescription updated if your vision changes.  Medicine review. Work with your health care provider to regularly review all of the medicines you are taking, including over-the-counter medicines. Ask your health care provider about any side effects that may make you more likely to have a fall. Tell your health care provider if any medicines that you take make you feel dizzy or sleepy.  Osteoporosis screening. Osteoporosis is a condition that causes the bones to get weaker. This can make the bones weak and cause them to break more easily.  Blood pressure screening. Blood pressure changes and medicines to control blood pressure can make you feel dizzy.  Strength and balance checks. Your health care provider may recommend certain tests to check your strength and balance while standing, walking, or changing positions.  Foot health exam. Foot pain and numbness, as well as not wearing proper footwear, can make you more likely  to have a fall.  Depression screening. You may be more likely to have a fall if you have a fear of falling, feel emotionally low, or feel unable to do activities that you used to do.  Alcohol use screening. Using too much alcohol can affect your balance and may make you more likely to have a fall. What actions can I take to lower my risk of falls? General instructions  Talk with your health care provider about your risks for falling. Tell your health care provider  if: ? You fall. Be sure to tell your health care provider about all falls, even ones that seem minor. ? You feel dizzy, sleepy, or off-balance.  Take over-the-counter and prescription medicines only as told by your health care provider. These include any supplements.  Eat a healthy diet and maintain a healthy weight. A healthy diet includes low-fat dairy products, low-fat (lean) meats, and fiber from whole grains, beans, and lots of fruits and vegetables. Home safety  Remove any tripping hazards, such as rugs, cords, and clutter.  Install safety equipment such as grab bars in bathrooms and safety rails on stairs.  Keep rooms and walkways well-lit. Activity   Follow a regular exercise program to stay fit. This will help you maintain your balance. Ask your health care provider what types of exercise are appropriate for you.  If you need a cane or walker, use it as recommended by your health care provider.  Wear supportive shoes that have nonskid soles. Lifestyle  Do not drink alcohol if your health care provider tells you not to drink.  If you drink alcohol, limit how much you have: ? 0-1 drink a day for women. ? 0-2 drinks a day for men.  Be aware of how much alcohol is in your drink. In the [place] one drink equals one typical bottle of beer (12 oz), one-half glass of wine (5 oz), or one shot of hard liquor (1 oz).  Do not use any products that contain nicotine or tobacco, such as cigarettes and e-cigarettes. If you need help quitting, ask your health care provider. Summary  Having a healthy lifestyle and getting preventive care can help to protect your health and wellness after age 24.  Screening and testing are the best way to find a health problem early and help you avoid having a fall. Early diagnosis and treatment give you the best chance for managing medical conditions that are more common for people who are older than age 59.  Falls are a major cause of broken bones and  head injuries in people who are older than age 46. Take precautions to prevent a fall at home.  Work with your health care provider to learn what changes you can make to improve your health and wellness and to prevent falls. This information is not intended to replace advice given to you by your health care provider. Make sure you discuss any questions you have with your health care provider. Document Released: 10/23/2017 Document Revised: 10/23/2017 Document Reviewed: 10/23/2017 Elsevier Interactive Patient Education  2019 [employer].

## 2019-01-12 ENCOUNTER — Ambulatory Visit: Payer: Medicare Other | Admitting: Family Medicine

## 2019-01-15 ENCOUNTER — Ambulatory Visit: Payer: Medicare Other

## 2019-01-19 ENCOUNTER — Ambulatory Visit (HOSPITAL_BASED_OUTPATIENT_CLINIC_OR_DEPARTMENT_OTHER): Payer: Medicare Other

## 2019-01-19 ENCOUNTER — Ambulatory Visit (INDEPENDENT_AMBULATORY_CARE_PROVIDER_SITE_OTHER): Payer: Medicare Other | Admitting: Family Medicine

## 2019-01-19 ENCOUNTER — Ambulatory Visit (HOSPITAL_BASED_OUTPATIENT_CLINIC_OR_DEPARTMENT_OTHER)
Admission: RE | Admit: 2019-01-19 | Discharge: 2019-01-19 | Disposition: A | Discharge status: Home / Self Care | Payer: Medicare Other | Source: Ambulatory Visit | Attending: Family Medicine | Admitting: Family Medicine

## 2019-01-19 DIAGNOSIS — E60 Dietary zinc deficiency: Secondary | ICD-10-CM | POA: Diagnosis not present

## 2019-01-19 DIAGNOSIS — Z79899 Other long term (current) drug therapy: Secondary | ICD-10-CM | POA: Diagnosis not present

## 2019-01-19 DIAGNOSIS — E890 Postprocedural hypothyroidism: Secondary | ICD-10-CM

## 2019-01-19 DIAGNOSIS — F419 Anxiety disorder, unspecified: Secondary | ICD-10-CM

## 2019-01-19 DIAGNOSIS — R0989 Other specified symptoms and signs involving the circulatory and respiratory systems: Secondary | ICD-10-CM | POA: Diagnosis not present

## 2019-01-19 DIAGNOSIS — M549 Dorsalgia, unspecified: Secondary | ICD-10-CM | POA: Diagnosis not present

## 2019-01-19 DIAGNOSIS — I10 Essential (primary) hypertension: Secondary | ICD-10-CM | POA: Diagnosis not present

## 2019-01-19 DIAGNOSIS — R918 Other nonspecific abnormal finding of lung field: Secondary | ICD-10-CM | POA: Diagnosis not present

## 2019-01-19 DIAGNOSIS — E782 Mixed hyperlipidemia: Secondary | ICD-10-CM | POA: Diagnosis not present

## 2019-01-19 DIAGNOSIS — K5900 Constipation, unspecified: Secondary | ICD-10-CM

## 2019-01-19 DIAGNOSIS — Z86010 Personal history of colonic polyps: Secondary | ICD-10-CM

## 2019-01-19 DIAGNOSIS — R1011 Right upper quadrant pain: Secondary | ICD-10-CM

## 2019-01-19 LAB — CBC
HCT: 41.8 % (ref 36.0–46.0)
Hemoglobin: 13.8 g/dL (ref 12.0–15.0)
MCHC: 33.1 g/dL (ref 30.0–36.0)
MCV: 87.8 fl (ref 78.0–100.0)
Platelets: 254 10*3/uL (ref 150.0–400.0)
RBC: 4.76 Mil/uL (ref 3.87–5.11)
RDW: 14.9 % (ref 11.5–15.5)
WBC: 8.3 10*3/uL (ref 4.0–10.5)

## 2019-01-19 LAB — LIPID PANEL
Cholesterol: 125 mg/dL (ref 0–200)
HDL: 41.8 mg/dL (ref >39.00)
NonHDL: 83.09
Total CHOL/HDL Ratio: 3
Triglycerides: 306 mg/dL — ABNORMAL HIGH (ref 0.0–149.0)
VLDL: 61.2 mg/dL — ABNORMAL HIGH (ref 0.0–40.0)

## 2019-01-19 LAB — COMPREHENSIVE METABOLIC PANEL
ALT: 17 U/L (ref 0–35)
AST: 16 U/L (ref 0–37)
Albumin: 4 g/dL (ref 3.5–5.2)
Alkaline Phosphatase: 84 U/L (ref 39–117)
BUN: 20 mg/dL (ref 6–23)
CO2: 32 mEq/L (ref 19–32)
Calcium: 8.9 mg/dL (ref 8.4–10.5)
Chloride: 102 mEq/L (ref 96–112)
Creatinine, Ser: 0.97 mg/dL (ref 0.40–1.20)
GFR: 56.46 mL/min — ABNORMAL LOW (ref >60.00)
Glucose, Bld: 89 mg/dL (ref 70–99)
Potassium: 4.3 mEq/L (ref 3.5–5.1)
Sodium: 142 mEq/L (ref 135–145)
Total Bilirubin: 0.4 mg/dL (ref 0.2–1.2)
Total Protein: 6.8 g/dL (ref 6.0–8.3)

## 2019-01-19 LAB — TSH: TSH: 6.63 u[IU]/mL — ABNORMAL HIGH (ref 0.35–4.50)

## 2019-01-19 LAB — VITAMIN D 25 HYDROXY (VIT D DEFICIENCY, FRACTURES): VITD: 48.8 ng/mL (ref 30.00–100.00)

## 2019-01-19 LAB — LDL CHOLESTEROL, DIRECT: Direct LDL: 49 mg/dL

## 2019-01-19 MED ORDER — TIZANIDINE HCL 2 MG PO TABS: 2.0000 mg | ORAL_TABLET | Freq: Three times a day (TID) | ORAL | 1 refills | Status: DC | PRN

## 2019-01-19 MED ORDER — FUROSEMIDE 40 MG PO TABS: 40.0000 mg | ORAL_TABLET | Freq: Every day | ORAL | 1 refills | Status: DC

## 2019-01-19 MED ORDER — GABAPENTIN 300 MG PO CAPS: 300.0000 mg | ORAL_CAPSULE | Freq: Three times a day (TID) | ORAL | 1 refills | Status: DC | PRN

## 2019-01-19 MED ORDER — ALPRAZOLAM 0.25 MG PO TABS: 0.2500 mg | ORAL_TABLET | Freq: Two times a day (BID) | ORAL | 0 refills | Status: DC | PRN

## 2019-01-19 NOTE — Assessment & Plan Note (Signed)
Check vitamin D and cmp

## 2019-01-19 NOTE — Assessment & Plan Note (Signed)
Tolerating statin, encouraged heart healthy diet, avoid trans fats, minimize simple carbs and saturated fats. Increase exercise as tolerated. On Repatha every 2 weeks x roughly a year and feels her back pain and myalgias all worse since adding. She will try taking a Holiday to see if her pain improves since the pain is debilitating and keeping her from exercising. The muscles and skin are tender to touch.

## 2019-01-19 NOTE — Progress Notes (Signed)
Subjective:    Patient ID: [name], female    DOB: 01/01/1946, 73 y.o.   MRN: [id number]  No chief complaint on file.   HPI Patient is in today for follow-up and is noting overall feeling better after being seen in the hospital in December during a respiratory illness.  She was hospitalized with a respiratory illness and found to have some pulmonary congestion consistent with possible early CHF.  She ultimately underwent a cardiac cath which was unremarkable.  Since having her treatment for her respiratory illness and with some time her symptoms have all abated.  No chest pain, palpitation shortness of breath wheezing coughing or other respiratory concerns persist.  Her greater concern today is her pain.  She notes she has diffuse pain she has back pain, neck pain, hip pain but also lower extremity pain and she describes her skin and muscles tender to even light touch.  She feels as if this worsened after starting Repatha about a year ago.  She had had some minor muscle aches and pains on Crestor but is noting a significant worsening.  No other recent febrile illness or acute concerns noted at today's visit. Denies CP/palp/SOB/HA/congestion/fevers/GI or GU c/o. Taking meds as prescribed  Past Medical History:  Diagnosis Date  . Acute MI, subendocardial ([place])   . Arthritis   . Back pain 05/31/2013  . Benign paroxysmal positional vertigo 08/05/2016  . CAD (coronary artery disease)    a. NSTEMI 10/2011: [facility] 11/19/11: pLAD 30%, oOM 60%, AVCFX 30%, CFX after OM2 30%, pRCA 60 and 70%, then 99%, AM 80-90% with TIMI 3 flow.  PCI: Promus DES x 2 to RCA; b. 06/2012 Cath: patent RCA stents w/ subtl occl of Acute Marginal (jailed)->Med rx; c. 05/2015 MV: EF 59%, mod mid infsept/inf/ap lat/ap infarct with peri-infarct isch-->Med Rx; d. 02/2016 Cath: LM nl, LAD 104m, RI 50, RCA patent stents.  . Colitis   . Facial skin lesion 01/28/2017  . GERD (gastroesophageal reflux disease)    occasional  .  Hyperlipidemia   . Hypertension   . Hypertensive heart disease    a. Echocardiogram 11/19/11: Difficult acoustic windows, EF 60-65%, normal LV wall thickness, grade 1 diastolic dysfunction  . Hypoparathyroidism ([place])   . Low zinc level 11/26/2016  . Multinodular thyroid    Goiter s/p thyroidectomy in 2007 with post-op  hypocalcemia and post-op hypothyroidism/hypoparathyroidism with hypocalcemia  . Neck pain on right side 07/13/2013  . Nocturia 05/31/2013  . Pain in right axilla 03/13/2016  . Palpitations    a. 03/2012 - patient set up for event monitor but did not wear correctly -  she declined wearing a repeat monitor  . Post-surgical hypothyroidism   . Sun-damaged skin 12/05/2014  . Tubular adenoma of colon 06/2011  . Unspecified constipation 05/31/2013  . Vertigo   . Zinc deficiency 11/26/2015    Past Surgical History:  Procedure Laterality Date  . CARDIAC CATHETERIZATION N/A 03/08/2016   Procedure: Left Heart Cath and Coronary Angiography;  Surgeon: Jettie Booze, MD;  Location: [facility];  Service: Cardiovascular;  Laterality: N/A;  . COLONOSCOPY  Aenomatous polyps   07/05/2011  . COLONOSCOPY N/A 09/28/2014   Procedure: COLONOSCOPY;  Surgeon: Ladene Artist, MD;  Location: [facility];  Service: Endoscopy;  Laterality: N/A;  . CORONARY ANGIOPLASTY WITH STENT PLACEMENT    . LEFT HEART CATH AND CORONARY ANGIOGRAPHY N/A 12/09/2018   Procedure: LEFT HEART CATH AND CORONARY ANGIOGRAPHY;  Surgeon: Jettie Booze, MD;  Location: [facility];  Service: Cardiovascular;  Laterality: N/A;  . LEFT HEART CATHETERIZATION WITH CORONARY ANGIOGRAM N/A 07/17/2012   Procedure: LEFT HEART CATHETERIZATION WITH CORONARY ANGIOGRAM;  Surgeon: Hillary Bow, MD;  Location: [facility] C[facility];  Service: Cardiovascular;  Laterality: N/A;  . PERCUTANEOUS CORONARY STENT INTERVENTION (PCI-S) N/A 11/19/2011   Procedure: PERCUTANEOUS CORONARY STENT INTERVENTION (PCI-S);  Surgeon: Hillary Bow, MD;  Location: [facility] CATH LAB;  Service: Cardiovascular;  Laterality: N/A;  . SHOULDER ARTHROSCOPY W/ ROTATOR CUFF REPAIR     right  . TOTAL THYROIDECTOMY  2007   GOITER    Family History  Problem Relation Age of Onset  . Colon cancer Brother   . Cancer Brother        COLON  . Hypertension Father   . Heart disease Father   . Heart attack Father   . Blindness Sister   . Congestive Heart Failure Sister   . Hypertension Sister   . Healthy Daughter   . Healthy Son   . Thyroid disease Sister   . Cancer Brother        multiple myelomas  . Healthy Daughter   . Healthy Daughter   . Diabetes Neg Hx   . Prostate cancer Neg Hx   . Breast cancer Neg Hx     Social History   Socioeconomic History  . Marital status: Married    Spouse name: Not on file  . Number of children: 4  . Years of education: 59  . Highest education level: Not on file  Occupational History  . Occupation: Press photographer person at [employer]  . Financial resource strain: Not on file  . Food insecurity:    Worry: Not on file    Inability: Not on file  . Transportation needs:    Medical: Not on file    Non-medical: Not on file  Tobacco Use  . Smoking status: Never Smoker  . Smokeless tobacco: Never Used  Substance and Sexual Activity  . Alcohol use: Yes    Comment: rare once per month  . Drug use: No  . Sexual activity: Not Currently  Lifestyle  . Physical activity:    Days per week: Not on file    Minutes per session: Not on file  . Stress: Not on file  Relationships  . Social connections:    Talks on phone: Not on file    Gets together: Not on file    Attends religious service: Not on file    Active member of club or organization: Not on file    Attends meetings of clubs or organizations: Not on file    Relationship status: Not on file  . Intimate partner violence:    Fear of current or ex partner: Not on file    Emotionally abused: Not on file    Physically abused: Not on file     Forced sexual activity: Not on file  Other Topics Concern  . Not on file  Social History Narrative   Lives at home alone.  Her son lives near her.   Right-handed.   1 cup coffee per day.    Outpatient Medications Prior to Visit  Medication Sig Dispense Refill  . amLODipine (NORVASC) 10 MG tablet Take 1 tablet (10 mg total) by mouth daily. 90 tablet 3  . aspirin EC 81 MG tablet Take 81 mg by mouth daily.      . B Complex-C (B-COMPLEX WITH VITAMIN C) tablet Take  1 tablet by mouth daily.    . calcitRIOL (ROCALTROL) 0.25 MCG capsule TAKE 3 CAPSULES(0.75 MCG) BY MOUTH DAILY 270 capsule 3  . carvedilol (COREG) 3.125 MG tablet Take 1 tablet (3.125 mg total) by mouth 2 (two) times daily. 180 tablet 3  . cholecalciferol (VITAMIN D) 1000 UNITS tablet Take 1,000 Units by mouth daily.    . clopidogrel (PLAVIX) 75 MG tablet Take 1 tablet (75 mg total) by mouth daily. 90 tablet 3  . isosorbide mononitrate (IMDUR) 60 MG 24 hr tablet Take 1 tablet (60 mg total) by mouth daily. 90 tablet 3  . ketoconazole (NIZORAL) 2 % cream Apply 1 fingertip amount to each foot daily. 30 g 0  . levothyroxine (SYNTHROID, LEVOTHROID) 100 MCG tablet TAKE 1 TABLET(100 MCG) BY MOUTH DAILY (Patient taking differently: Take 100 mcg by mouth every morning. ) 90 tablet 0  . Multiple Vitamin (MULTIVITAMIN WITH MINERALS) TABS tablet Take 1 tablet by mouth daily.    . pantoprazole (PROTONIX) 40 MG tablet Take 1 tablet (40 mg total) by mouth daily. 90 tablet 3  . REPATHA SURECLICK 947 MG/ML SOAJ ADMINISTER 1 ML UNDER THE SKIN EVERY 14 DAYS (Patient taking differently: Inject 1 mL into the skin every 14 (fourteen) days. ) 6 pen 1  . rosuvastatin (CRESTOR) 40 MG tablet Take 1 tablet (40 mg total) by mouth daily. 90 tablet 3  . ALPRAZolam (XANAX) 0.25 MG tablet Take 1 tablet (0.25 mg total) by mouth 2 (two) times daily as needed for anxiety. 5 tablet 0  . furosemide (LASIX) 40 MG tablet Take 1 tablet (40 mg total) by mouth daily. 30  tablet 0  . gabapentin (NEURONTIN) 300 MG capsule Take 1 capsule by mouth 3 (three) times daily as needed (nerve pain).   0  . hyoscyamine (LEVSIN SL) 0.125 MG SL tablet Place 1 tablet (0.125 mg total) under the tongue every 4 (four) hours as needed. 30 tablet 2  . traMADol (ULTRAM) 50 MG tablet Take 1 tablet (50 mg total) by mouth every 8 (eight) hours as needed. 15 tablet 0  . nitroGLYCERIN (NITROSTAT) 0.4 MG SL tablet DISSOLVE 1 TABLET UNDER THE TONGUE EVERY 5 MINUTES AS NEEDED FOR CHEST PAIN AS DIRECTED (Patient not taking: No sig reported) 75 tablet 0  . methocarbamol (ROBAXIN) 500 MG tablet Take 1 tablet (500 mg total) by mouth 2 (two) times daily. (Patient not taking: Reported on 01/19/2019) 60 tablet 0  . methylPREDNISolone (MEDROL) 4 MG tablet 6 tabs by mouth once daily for 3 days, then 5 tabs daily x 3 days, then 4 tabs daily x 3 days, then 3 tabs daily x 3 days, then 2 tabs daily x 3 days, then 2 tabs daily x 3 days, then 1 tab daily x 3 days (Patient not taking: Reported on 01/01/2019) 69 tablet 0   No facility-administered medications prior to visit.     Allergies  Allergen Reactions  . Zetia [Ezetimibe] Other (See Comments)    Myalgia, paresthesias and weakness    Review of Systems  Constitutional: Negative for fever and malaise/fatigue.  HENT: Negative for congestion.   Eyes: Negative for blurred vision.  Respiratory: Negative for shortness of breath.   Cardiovascular: Negative for chest pain, palpitations and leg swelling.  Gastrointestinal: Negative for abdominal pain, blood in stool and nausea.  Genitourinary: Negative for dysuria and frequency.  Musculoskeletal: Positive for back pain, joint pain and myalgias. Negative for falls.  Skin: Negative for rash.  Neurological: Negative for dizziness,  loss of consciousness and headaches.  Endo/Heme/Allergies: Negative for environmental allergies.  Psychiatric/Behavioral: Negative for depression. The patient is not  nervous/anxious.        Objective:    Physical Exam Vitals signs and nursing note reviewed.  Constitutional:      General: She is not in acute distress.    Appearance: She is well-developed.  HENT:     Head: Normocephalic and atraumatic.     Nose: Nose normal.  Eyes:     General:        Right eye: No discharge.        Left eye: No discharge.  Neck:     Musculoskeletal: Normal range of motion and neck supple.  Cardiovascular:     Rate and Rhythm: Normal rate and regular rhythm.     Heart sounds: No murmur.  Pulmonary:     Effort: Pulmonary effort is normal.     Breath sounds: Normal breath sounds.  Abdominal:     General: Bowel sounds are normal.     Palpations: Abdomen is soft.     Tenderness: There is no abdominal tenderness.  Skin:    General: Skin is warm and dry.  Neurological:     Mental Status: She is alert and oriented to person, place, and time.     There were no vitals taken for this visit. Wt Readings from Last 3 Encounters:  01/08/19 199 lb 12.8 oz (90.6 kg)  01/01/19 202 lb 12.8 oz (92 kg)  12/09/18 205 lb (93 kg)     Lab Results  Component Value Date   WBC 9.0 12/05/2018   HGB 14.2 12/05/2018   HCT 42.3 12/05/2018   PLT 299 12/05/2018   GLUCOSE 91 12/05/2018   CHOL 101 10/07/2018   TRIG 146 10/07/2018   HDL 48 10/07/2018   LDLDIRECT 26 05/20/2018   LDLCALC 24 10/07/2018   ALT 18 11/18/2018   AST 18 11/18/2018   NA 144 12/05/2018   K 4.1 12/05/2018   CL 97 12/05/2018   CREATININE 1.04 (H) 12/05/2018   BUN 21 12/05/2018   CO2 27 12/05/2018   TSH 7.920 (H) 12/12/2018   INR 0.86 03/07/2016   HGBA1C 5.9 05/07/2016    Lab Results  Component Value Date   TSH 7.920 (H) 12/12/2018   Lab Results  Component Value Date   WBC 9.0 12/05/2018   HGB 14.2 12/05/2018   HCT 42.3 12/05/2018   MCV 84 12/05/2018   PLT 299 12/05/2018   Lab Results  Component Value Date   NA 144 12/05/2018   K 4.1 12/05/2018   CO2 27 12/05/2018   GLUCOSE 91  12/05/2018   BUN 21 12/05/2018   CREATININE 1.04 (H) 12/05/2018   BILITOT 0.6 11/18/2018   ALKPHOS 83 03/14/2018   AST 18 11/18/2018   ALT 18 11/18/2018   PROT 7.6 11/18/2018   ALBUMIN 4.0 03/14/2018   CALCIUM 8.1 (L) 12/05/2018   ANIONGAP 11 11/17/2018   GFR 58.76 (L) 03/14/2018   Lab Results  Component Value Date   CHOL 101 10/07/2018   Lab Results  Component Value Date   HDL 48 10/07/2018   Lab Results  Component Value Date   LDLCALC 24 10/07/2018   Lab Results  Component Value Date   TRIG 146 10/07/2018   Lab Results  Component Value Date   CHOLHDL 2.1 10/07/2018   Lab Results  Component Value Date   HGBA1C 5.9 05/07/2016       Assessment &  Plan:   Problem List Items Addressed This Visit    Hypothyroidism    On Levothyroxine, continue to monitor      Relevant Orders   TSH   Hyperlipidemia    Tolerating statin, encouraged heart healthy diet, avoid trans fats, minimize simple carbs and saturated fats. Increase exercise as tolerated. On Repatha every 2 weeks x roughly a year and feels her back pain and myalgias all worse since adding. She will try taking a Holiday to see if her pain improves since the pain is debilitating and keeping her from exercising. The muscles and skin are tender to touch.       Relevant Medications   furosemide (LASIX) 40 MG tablet   Other Relevant Orders   Lipid panel   Essential hypertension, benign   Relevant Medications   furosemide (LASIX) 40 MG tablet   Other Relevant Orders   CBC   Comprehensive metabolic panel   Hypocalcemia    Check vitamin D and cmp      Relevant Orders   VITAMIN D 25 Hydroxy (Vit-D Deficiency, Fractures)   Constipation   Back pain   Relevant Medications   tiZANidine (ZANAFLEX) 2 MG tablet   Hx of colonic polyps   Zinc deficiency    Recheck today, not taking a supplement but does take an MVI with minerals      Relevant Orders   Zinc   Anxiety   Relevant Medications   ALPRAZolam (XANAX)  0.25 MG tablet   Pulmonary congestion    Noted on CXR in Dec 2019 during acute respiratory illness. Cardiac cath was good at that time and as infectious symptoms have resolved her breathing is greatly improved will check repeat cxr to assess resolution      Relevant Orders   DG Chest 2 View    Other Visit Diagnoses    High risk medication use    -  Primary   Relevant Orders   Pain Mgmt, Profile 8 w/Conf, U   RUQ pain       Relevant Orders   US Abdomen Complete      I have discontinued Mikia T. Hartgrove's hyoscyamine, traMADol, methylPREDNISolone, and methocarbamol. I have also changed her gabapentin. Additionally, I am having her start on tiZANidine. Lastly, I am having her maintain her aspirin EC, B-complex with vitamin C, cholecalciferol, multivitamin with minerals, ketoconazole, calcitRIOL, carvedilol, clopidogrel, pantoprazole, rosuvastatin, levothyroxine, nitroGLYCERIN, amLODipine, isosorbide mononitrate, REPATHA SURECLICK, furosemide, and ALPRAZolam.  Meds ordered this encounter  Medications  . furosemide (LASIX) 40 MG tablet    Sig: Take 1 tablet (40 mg total) by mouth daily.    Dispense:  90 tablet    Refill:  1  . gabapentin (NEURONTIN) 300 MG capsule    Sig: Take 1 capsule (300 mg total) by mouth 3 (three) times daily as needed (nerve pain).    Dispense:  90 capsule    Refill:  1  . ALPRAZolam (XANAX) 0.25 MG tablet    Sig: Take 1 tablet (0.25 mg total) by mouth 2 (two) times daily as needed for anxiety.    Dispense:  30 tablet    Refill:  0  . tiZANidine (ZANAFLEX) 2 MG tablet    Sig: Take 1-2 tablets (2-4 mg total) by mouth every 8 (eight) hours as needed for muscle spasms.    Dispense:  40 tablet    Refill:  1     Penni Homans, MD

## 2019-01-19 NOTE — Assessment & Plan Note (Signed)
On Levothyroxine, continue to monitor 

## 2019-01-19 NOTE — Patient Instructions (Signed)
Pulmonary congestion due to infection  Acute respiratory distress syndrome is a life-threatening condition in which fluid collects in the lungs. This prevents the lungs from filling with air and passing oxygen into the blood. This can cause the lungs and other vital organs to fail. The condition usually develops following an infection, illness, surgery, or injury. What are the causes? This condition may be caused by:  An infection, such as sepsis or pneumonia.  A serious injury to the head or chest.  Severe bleeding from an injury.  A major surgery.  Breathing in harmful chemicals or smoke.  Blood transfusions.  A blood clot in the lungs.  Breathing in vomit (aspiration).  Near-drowning.  Inflammation of the pancreas (pancreatitis).  A drug overdose. What are the signs or symptoms? Sudden shortness of breath and rapid breathing are the main symptoms of this condition. Other symptoms may include:  A fast or irregular heartbeat.  Skin, lips, or fingernails that look blue (cyanosis).  Confusion.  Tiredness or loss of energy.  Chest pain, particularly while taking a breath.  Coughing.  Restlessness or anxiety.  Fever. This is usually present if there is an underlying infection, such as pneumonia. How is this diagnosed? This condition is diagnosed based on:  Your symptoms.  Medical history.  A physical exam. During the exam, your health care provider will listen to your heart and check for crackling or wheezing sounds in your lungs. You may also have other tests to confirm the diagnosis and measure how well your lungs are working. These may include:  Measuring the amount of oxygen in your blood. Your health care provider will use two methods to do this procedure: ? A small device (pulse oximeter) that is placed on your finger, earlobe, or toe. ? An arterial blood gas test. A sample of blood is taken from an artery and tested for oxygen levels.  Blood  tests.  Chest X-rays or CT scans to look for fluid in the lungs.  Taking a sample of your sputum to test for infection.  Heart test, such as an echocardiogram or electrocardiogram. This is done to rule out any heart problems (such as heart failure) that may be causing your symptoms.  Bronchoscopy. During this test, a thin, flexible tube with a light is passed into the mouth or nose, down the windpipe, and into the lungs. How is this treated? Treatment depends on the cause of your condition. The goal is to support you while your lungs heal and the underlying cause is treated. Treatment may include:  Oxygen therapy. This may be done through: ? A tube in your nose or a face mask. ? A ventilator. This device helps move air into and out of your lungs through a breathing tube that is inserted into your mouth or nose.  Continuous positive airway pressure (CPAP). This treatment uses mild air pressure to keep the airways open. A mask or other device will be placed over your nose or mouth.  Tracheostomy. During this procedure, a small cut is made in your neck to create an opening to your windpipe. A breathing tube is placed directly into your windpipe. The breathing tube is connected to a ventilator. This is done if you have problems with your airway or if you need a ventilator for a long period of time.  Positioning you to lie on your stomach (prone position).  Medicines, such as: ? Sedatives to help you relax. ? Blood pressure medicines. ? Antibiotics to treat infection. ? Blood  thinners to prevent blood clots. ? Diuretics to help prevent excess fluid.  Fluids and nutrients given through an IV tube.  Wearing compression stockings on your legs to prevent blood clots.  Extra corporeal membrane oxygenation (ECMO). This treatment takes blood outside your body, adds oxygen, and removes carbon dioxide. The blood is then returned to your body. This treatment is only used in severe cases. Follow  these instructions at home:  Take over-the-counter and prescription medicines only as told by your health care provider.  Do not use any products that contain nicotine or tobacco, such as cigarettes and e-cigarettes. If you need help quitting, ask your health care provider.  Limit alcohol intake to no more than 1 drink per day for nonpregnant women and 2 drinks per day for men. One drink equals 12 oz of beer, 5 oz of wine, or 1 oz of hard liquor.  Ask friends and family to help you if daily activities make you tired.  Attend any pulmonary rehabilitation as told by your health care provider. This may include: ? Education about your condition. ? Exercises. ? Breathing training. ? Counseling. ? Learning techniques to conserve energy. ? Nutrition counseling.  Keep all follow-up visits as told by your health care provider. This is important. Contact a health care provider if:  You become short of breath during activity or while resting.  You develop a cough that does not go away.  You have a fever.  Your symptoms do not get better or they get worse.  You become anxious or depressed. Get help right away if:  You have sudden shortness of breath.  You develop sudden chest pain that does not go away.  You develop a rapid heart rate.  You develop swelling or pain in one of your legs.  You cough up blood.  You have trouble breathing.  Your skin, lips, or fingernails turn blue. These symptoms may represent a serious problem that is an emergency. Do not wait to see if the symptoms will go away. Get medical help right away. Call your local emergency services (911 in the [place] Do not drive yourself to the hospital. Summary  Acute respiratory distress syndrome is a life-threatening condition in which fluid collects in the lungs, which leads the lungs and other vital organs to fail.  This condition usually develops following an infection, illness, surgery, or injury.  Sudden  shortness of breath and rapid breathing are the main symptoms of acute respiratory distress syndrome.  Treatment may include oxygen therapy, continuous positive airway pressure (CPAP), tracheostomy, lying on your stomach (prone position), medicines, fluids and nutrients given through an IV tube, compression stockings, and extra corporeal membrane oxygenation (ECMO). This information is not intended to replace advice given to you by your health care provider. Make sure you discuss any questions you have with your health care provider. Document Released: 12/10/2005 Document Revised: 11/26/2016 Document Reviewed: 11/26/2016 Elsevier Interactive Patient Education  2019 [employer].

## 2019-01-19 NOTE — Assessment & Plan Note (Signed)
Noted on CXR in Dec 2019 during acute respiratory illness. Cardiac cath was good at that time and as infectious symptoms have resolved her breathing is greatly improved will check repeat cxr to assess resolution

## 2019-01-19 NOTE — Assessment & Plan Note (Signed)
Recheck today, not taking a supplement but does take an MVI with minerals

## 2019-01-21 LAB — PAIN MGMT, PROFILE 8 W/CONF, U
6 Acetylmorphine: NEGATIVE ng/mL (ref <10)
Alcohol Metabolites: POSITIVE ng/mL — AB (ref <500)
Amphetamines: NEGATIVE ng/mL (ref <500)
Benzodiazepines: NEGATIVE ng/mL (ref <100)
Buprenorphine, Urine: NEGATIVE ng/mL (ref <5)
Cocaine Metabolite: NEGATIVE ng/mL (ref <150)
Creatinine: 102.9 mg/dL
Ethyl Glucuronide (ETG): NEGATIVE ng/mL (ref <500)
Ethyl Sulfate (ETS): 254 ng/mL — ABNORMAL HIGH (ref <100)
MDMA: NEGATIVE ng/mL (ref <500)
Marijuana Metabolite: NEGATIVE ng/mL (ref <20)
Opiates: NEGATIVE ng/mL (ref <100)
Oxidant: NEGATIVE ug/mL (ref <200)
Oxycodone: NEGATIVE ng/mL (ref <100)
pH: 6.62 (ref 4.5–9.0)

## 2019-01-21 LAB — ZINC: ZINC: 61 ug/dL (ref 60–130)

## 2019-01-23 ENCOUNTER — Encounter: Payer: Self-pay | Admitting: Physical Therapy

## 2019-01-23 ENCOUNTER — Ambulatory Visit: Payer: Medicare Other | Attending: Rheumatology | Admitting: Physical Therapy

## 2019-01-23 DIAGNOSIS — M542 Cervicalgia: Secondary | ICD-10-CM | POA: Diagnosis not present

## 2019-01-23 DIAGNOSIS — M545 Low back pain, unspecified: Secondary | ICD-10-CM

## 2019-01-23 DIAGNOSIS — R252 Cramp and spasm: Secondary | ICD-10-CM | POA: Insufficient documentation

## 2019-01-23 NOTE — Therapy (Signed)
[place], [place], [address] Phone: [phone]   Fax:  [phone]  Physical Therapy Evaluation  Patient Details  Name: [name] MRN: [id number] Date of Birth: 11-May-1946 Referring Provider (PT): Deveshwar   Encounter Date: 01/23/2019  PT End of Session - 01/23/19 0915    Visit Number  1    Date for PT Re-Evaluation  03/24/19    PT Start Time  0840    PT Stop Time  0933    PT Time Calculation (min)  53 min    Activity Tolerance  Patient tolerated treatment well    Behavior During Therapy  [facility] for tasks assessed/performed       Past Medical History:  Diagnosis Date  . Acute MI, subendocardial ([place])   . Arthritis   . Back pain 05/31/2013  . Benign paroxysmal positional vertigo 08/05/2016  . CAD (coronary artery disease)    a. NSTEMI 10/2011: [place] 11/19/11: pLAD 30%, oOM 60%, AVCFX 30%, CFX after OM2 30%, pRCA 60 and 70%, then 99%, AM 80-90% with TIMI 3 flow.  PCI: Promus DES x 2 to RCA; b. 06/2012 Cath: patent RCA stents w/ subtl occl of Acute Marginal (jailed)->Med rx; c. 05/2015 MV: EF 59%, mod mid infsept/inf/ap lat/ap infarct with peri-infarct isch-->Med Rx; d. 02/2016 Cath: LM nl, LAD 67m, RI 50, RCA patent stents.  . Colitis   . Facial skin lesion 01/28/2017  . GERD (gastroesophageal reflux disease)    occasional  . Hyperlipidemia   . Hypertension   . Hypertensive heart disease    a. Echocardiogram 11/19/11: Difficult acoustic windows, EF 60-65%, normal LV wall thickness, grade 1 diastolic dysfunction  . Hypoparathyroidism ([place])   . Low zinc level 11/26/2016  . Multinodular thyroid    Goiter s/p thyroidectomy in 2007 with post-op  hypocalcemia and post-op hypothyroidism/hypoparathyroidism with hypocalcemia  . Neck pain on right side 07/13/2013  . Nocturia 05/31/2013  . Pain in right axilla 03/13/2016  . Palpitations    a. 03/2012 - patient set up for event monitor but did not wear  correctly -  she declined wearing a repeat monitor  . Post-surgical hypothyroidism   . Sun-damaged skin 12/05/2014  . Tubular adenoma of colon 06/2011  . Unspecified constipation 05/31/2013  . Vertigo   . Zinc deficiency 11/26/2015    Past Surgical History:  Procedure Laterality Date  . CARDIAC CATHETERIZATION N/A 03/08/2016   Procedure: Left Heart Cath and Coronary Angiography;  Surgeon: Jettie Booze, MD;  Location: [place] [facility];  Service: Cardiovascular;  Laterality: N/A;  . COLONOSCOPY  Aenomatous polyps   07/05/2011  . COLONOSCOPY N/A 09/28/2014   Procedure: COLONOSCOPY;  Surgeon: Ladene Artist, MD;  [facility]: [facility];  Service: Endoscopy;  Laterality: N/A;  . CORONARY ANGIOPLASTY WITH STENT PLACEMENT    . LEFT HEART CATH AND CORONARY ANGIOGRAPHY N/A 12/09/2018   Procedure: LEFT HEART CATH AND CORONARY ANGIOGRAPHY;  Surgeon: Jettie Booze, MD;  Location: [place] [facility];  Service: Cardiovascular;  Laterality: N/A;  . LEFT HEART CATHETERIZATION WITH CORONARY ANGIOGRAM N/A 07/17/2012   Procedure: LEFT HEART CATHETERIZATION WITH CORONARY ANGIOGRAM;  Surgeon: Hillary Bow, MD;  Location: [facility] CATH LAB;  Service: Cardiovascular;  Laterality: N/A;  . PERCUTANEOUS CORONARY STENT INTERVENTION (PCI-S) N/A 11/19/2011   Procedure: PERCUTANEOUS CORONARY STENT INTERVENTION (PCI-S);  Surgeon: Hillary Bow, MD;  Location: [facility] CATH LAB;  Service: Cardiovascular;  Laterality: N/A;  . SHOULDER  ARTHROSCOPY W/ ROTATOR CUFF REPAIR     right  . TOTAL THYROIDECTOMY  2007   GOITER    There were no vitals filed for this visit.   Subjective Assessment - 01/23/19 0840    Subjective  Patient reports that she has pain "everywhere".  She reports that she has had back pain for years, but reports that in the past few months she has had increased pain worst in the neck but again she reports "everywhere" and HA.  She is unsure of what causes the increased pain.  X-rays of the spine  show arthritis.  She reports that she does not take medication due to kidney issues    Limitations  Sitting;Lifting;House hold activities    Patient Stated Goals  have less pain    Currently in Pain?  Yes    Pain Score  6     Pain Location  Neck    Pain Orientation  Mid;Upper;Lower    Pain Descriptors / Indicators  Aching;Sore;Tightness    Pain Type  Acute pain    Pain Radiating Towards  denies    Pain Onset  More than a month ago    Pain Frequency  Constant    Aggravating Factors   up to 9/10 reports a sharp shooting pain reports that she is not sure of why, just happens.      Pain Relieving Factors  take medication, heat helps at best pain a 5/10    Effect of Pain on Daily Activities  limits everything         [facility] PT Assessment - 01/23/19 0001      Assessment   Medical Diagnosis  neck pain, back pain    Referring Provider (PT)  Deveshwar    Onset Date/Surgical Date  12/23/18    Prior Therapy  no      Precautions   Precautions  None      Balance Screen   Has the patient fallen in the past 6 months  No    Has the patient had a decrease in activity level because of a fear of falling?   No    Is the patient reluctant to leave their home because of a fear of falling?   No      Home Environment   Additional Comments  some hosuework, like to garden, lives with husband who is in bad health      Prior Function   Level of Independence  Independent    Vocation  Retired    Leisure  no exercise the past 6 months      Posture/Postural Control   Posture Comments  fwd head, rounded shoulders      ROM / Strength   AROM / PROM / Strength  AROM;Strength      AROM   Overall AROM Comments  cervical ROM decreased 25% with increased pain, Shoulder ROM flexion 130, ER 60, IR 60 degrees all increase pain, lumbar ROM decreased 75% due to pain in the low back      Strength   Overall Strength Comments  UE and LE 4-/5 due to pain      Flexibility   Soft Tissue Assessment /Muscle Length   yes    Hamstrings  tight    Piriformis  tight      Palpation   Palpation comment  she is very tight in the upper trap, cervical and rhomboids , lumbar and SI area, very tender      Ambulation/Gait  Gait Comments  no device, slow, antalgic on the left                Objective measurements completed on examination: See above findings.      [facility] Adult PT Treatment/Exercise - 01/23/19 0001      Modalities   Modalities  Electrical Stimulation;Moist Heat      Moist Heat Therapy   Number Minutes Moist Heat  15 Minutes    Moist Heat Location  Cervical;Lumbar Spine      Electrical Stimulation   Electrical Stimulation Location  C/T area    Electrical Stimulation Action  IFC    Electrical Stimulation Parameters  supine    Electrical Stimulation Goals  Pain             PT Education - 01/23/19 0915    Education Details  cervical and scapular retraction, shrugs, gentle upper trap stretches    Person(s) Educated  Patient    Methods  Explanation;Demonstration;Handout    Comprehension  Verbalized understanding       PT Short Term Goals - 01/23/19 0923      PT SHORT TERM GOAL #1   Title  independent with initial HEP    Time  2    Period  Weeks    Status  New        PT Long Term Goals - 01/23/19 1638      PT LONG TERM GOAL #1   Title  understand posture and body mechanics    Time  8    Period  Weeks    Status  New      PT LONG TERM GOAL #2   Title  decrease pain 25%    Time  8    Period  Weeks    Status  New      PT LONG TERM GOAL #3   Title  increase cervical ROM 25%    Time  8    Period  Weeks    Status  New      PT LONG TERM GOAL #4   Title  report able to walk without pain    Time  8    Period  Weeks    Status  New      PT LONG TERM GOAL #5   Title  decrease HA frequency 25%    Time  8    Period  Weeks    Status  New             Plan - 01/23/19 0916    Clinical Impression Statement  Patient reports that she has been  having significant pain in the neck and back and "everywhere" for the past 3 months, she reports that she has had pain before but recently worse, she reports decreased tolerance to activity.  She has a lot of spasms and tightness in the upper traps, the neck and the low back, she is gaurded with motions and reports a HA often.  Her cervical ROM was decreased 75%    Clinical Presentation  Stable    Clinical Decision Making  Low    Rehab Potential  Good    PT Frequency  2x / week    PT Duration  8 weeks    PT Treatment/Interventions  ADLs/Self Care Home Management;Cryotherapy;Electrical Stimulation;Moist Heat;Traction;Ultrasound;Therapeutic exercise;Therapeutic activities;Neuromuscular re-education;Patient/family education;Manual techniques;Dry needling    PT Next Visit Plan  slowly start exercises to get her moving and decrease spasms    Consulted and Agree with  Plan of Care  Patient       Patient will benefit from skilled therapeutic intervention in order to improve the following deficits and impairments:  Abnormal gait, Improper body mechanics, Pain, Postural dysfunction, Increased muscle spasms, Decreased mobility, Decreased activity tolerance, Decreased range of motion, Decreased strength, Impaired flexibility, Difficulty walking  Visit Diagnosis: Cervicalgia - Plan: PT plan of care cert/re-cert  Cramp and spasm - Plan: PT plan of care cert/re-cert  Acute bilateral low back pain without sciatica - Plan: PT plan of care cert/re-cert     Problem List Patient Active Problem List   Diagnosis Date Noted  . Pulmonary congestion 01/19/2019  . Primary osteoarthritis of both knees 12/19/2018  . DDD (degenerative disc disease), lumbar 12/19/2018  . ANA positive 10/27/2018  . CAP (community acquired pneumonia) 03/06/2018  . Lower extremity pain, lateral, right 02/17/2018  . Post-menopausal bleeding 02/17/2018  . Cephalalgia 02/17/2018  . Anxiety 02/17/2018  . Peripheral arterial disease  ([place]) 02/20/2017  . Facial skin lesion 01/28/2017  . Benign paroxysmal positional vertigo 08/05/2016  . Atypical chest pain 03/27/2016  . Esophageal reflux 03/27/2016  . Antiplatelet or antithrombotic long-term use 03/27/2016  . Pain in right axilla 03/13/2016  . Chest pain 03/07/2016  . Right lumbar radiculopathy 02/21/2016  . Abnormality of gait 02/21/2016  . Zinc deficiency 11/26/2015  . Chronic low back pain 10/26/2015  . Right hip pain 10/26/2015  . Medicare annual wellness visit, subsequent 03/13/2015  . Preventative health care 03/13/2015  . Sun-damaged skin 12/05/2014  . Angina of effort ([place]) 12/02/2014  . Hx of colonic polyps 09/28/2014  . Benign neoplasm of descending colon 09/28/2014  . Other fatigue 09/10/2014  . Personal history of colonic polyps 07/16/2014  . Bilateral hand pain 03/20/2014  . Postsurgical hypoparathyroidism ([place]) 08/03/2013  . Neck pain on right side 07/13/2013  . Constipation 05/31/2013  . Back pain 05/31/2013  . Nocturia 05/31/2013  . GERD (gastroesophageal reflux disease) 11/02/2012  . Tension headache 11/02/2012  . Anemia 06/11/2012  . Hypokalemia 06/11/2012  . Depression 01/21/2012  . Headache(784.0) 12/23/2011  . Bradycardia 12/07/2011  . Hypocalcemia 12/07/2011  . Acute MI, subendocardial ([place]) 11/20/2011  . Hypothyroidism 05/20/2009  . Essential hypertension, benign 05/20/2009  . Hyperlipidemia 03/08/2009  . CAD, NATIVE VESSEL 03/08/2009    Sumner Boast., PT 01/23/2019, 9:26 AM  [facility] [address] [place] [place], [place], [address] Phone: [phone]   Fax:  [phone]  Name: [name] MRN: [id number] Date of Birth: 12-Aug-1946

## 2019-01-26 ENCOUNTER — Other Ambulatory Visit (HOSPITAL_BASED_OUTPATIENT_CLINIC_OR_DEPARTMENT_OTHER): Payer: Medicare Other

## 2019-01-26 ENCOUNTER — Ambulatory Visit (HOSPITAL_BASED_OUTPATIENT_CLINIC_OR_DEPARTMENT_OTHER): Payer: Medicare Other

## 2019-01-27 ENCOUNTER — Ambulatory Visit: Payer: Medicare Other | Attending: Rheumatology | Admitting: Physical Therapy

## 2019-01-27 DIAGNOSIS — M542 Cervicalgia: Secondary | ICD-10-CM | POA: Diagnosis not present

## 2019-01-27 DIAGNOSIS — M545 Low back pain, unspecified: Secondary | ICD-10-CM

## 2019-01-27 DIAGNOSIS — R252 Cramp and spasm: Secondary | ICD-10-CM | POA: Insufficient documentation

## 2019-01-27 DIAGNOSIS — H8110 Benign paroxysmal vertigo, unspecified ear: Secondary | ICD-10-CM | POA: Diagnosis not present

## 2019-01-27 DIAGNOSIS — H903 Sensorineural hearing loss, bilateral: Secondary | ICD-10-CM | POA: Insufficient documentation

## 2019-01-27 NOTE — Patient Instructions (Signed)
Access Code: [email or web address]  URL: [email or web address]  Date: 01/27/2019  Prepared by: Madelyn Flavors   Exercises  Supine Lower Trunk Rotation - 10 reps - 1 sets - 10 seconds hold - 2x daily - 7x weekly  Supine Piriformis Stretch with Foot on Ground - 3 reps - 1 sets - 30 sec hold - 1x daily - 7x weekly  Supine Figure 4 Piriformis Stretch - 3 reps - 1 sets - 30-60 sec hold - 1x daily - 7x weekly

## 2019-01-27 NOTE — Therapy (Addendum)
[place], [place], [address] Phone: [phone]   Fax:  [phone]  Physical Therapy Treatment  Patient Details  Name: [name] MRN: [id number] Date of Birth: 10-Aug-1946 Referring Provider (PT): Deveshwar   Encounter Date: 01/27/2019  PT End of Session - 01/27/19 0933    Visit Number  2    Date for PT Re-Evaluation  03/24/19    PT Start Time  0933    PT Stop Time  1018    PT Time Calculation (min)  45 min    Activity Tolerance  Patient limited by pain   also limited by vertigo   Behavior During Therapy  [facility] for tasks assessed/performed       Past Medical History:  Diagnosis Date  . Acute MI, subendocardial ([place])   . Arthritis   . Back pain 05/31/2013  . Benign paroxysmal positional vertigo 08/05/2016  . CAD (coronary artery disease)    a. NSTEMI 10/2011: [place] 11/19/11: pLAD 30%, oOM 60%, AVCFX 30%, CFX after OM2 30%, pRCA 60 and 70%, then 99%, AM 80-90% with TIMI 3 flow.  PCI: Promus DES x 2 to RCA; b. 06/2012 Cath: patent RCA stents w/ subtl occl of Acute Marginal (jailed)->Med rx; c. 05/2015 MV: EF 59%, mod mid infsept/inf/ap lat/ap infarct with peri-infarct isch-->Med Rx; d. 02/2016 Cath: LM nl, LAD 45m RI 50, RCA patent stents.  . Colitis   . Facial skin lesion 01/28/2017  . GERD (gastroesophageal reflux disease)    occasional  . Hyperlipidemia   . Hypertension   . Hypertensive heart disease    a. Echocardiogram 11/19/11: Difficult acoustic windows, EF 60-65%, normal LV wall thickness, grade 1 diastolic dysfunction  . [place]ar[place] ([place]   . Low zinc level 11/26/2016  . Multinodular thyroid    Goiter s/p thyroidectomy in 2007 with post-op  hypocalcemia and post-op hypothyroidism/hypoparathyroidism with hypocalcemia  . Neck pain on right side 07/13/2013  . Nocturia 05/31/2013  . Pain in right axilla 03/13/2016  . Palpitations    a. 03/2012 - patient set up for event monitor but did  not wear correctly -  she declined wearing a repeat monitor  . Post-surgical hypothyroidism   . Sun-damaged skin 12/05/2014  . Tubular adenoma of colon 06/2011  . Unspecified constipation 05/31/2013  . Vertigo   . Zinc deficiency 11/26/2015    Past Surgical History:  Procedure Laterality Date  . CARDIAC CATHETERIZATION N/A 03/08/2016   Procedure: Left Heart Cath and Coronary Angiography;  Surgeon: JJettie Booze MD;  [facility];  Service: Cardiovascular;  Laterality: N/A;  . COLONOSCOPY  Aenomatous polyps   07/05/2011  . COLONOSCOPY N/A 09/28/2014   Procedure: COLONOSCOPY;  Surgeon: MLadene Artist MD;  [facility];  Service: Endoscopy;  Laterality: N/A;  . CORONARY ANGIOPLASTY WITH STENT PLACEMENT    . LEFT HEART CATH AND CORONARY ANGIOGRAPHY N/A 12/09/2018   Procedure: LEFT HEART CATH AND CORONARY ANGIOGRAPHY;  Surgeon: VJettie Booze MD;  [facility];  Service: Cardiovascular;  Laterality: N/A;  . LEFT HEART CATHETERIZATION WITH CORONARY ANGIOGRAM N/A 07/17/2012   Procedure: LEFT HEART CATHETERIZATION WITH CORONARY ANGIOGRAM;  Surgeon: THillary Bow MD;  [facility];  Service: Cardiovascular;  Laterality: N/A;  . PERCUTANEOUS CORONARY STENT INTERVENTION (PCI-S) N/A 11/19/2011   Procedure: PERCUTANEOUS CORONARY STENT INTERVENTION (PCI-S);  Surgeon: THillary Bow MD;  [facility];  Service: Cardiovascular;  Laterality: N/A;  . SHOULDER ARTHROSCOPY W/ ROTATOR CUFF REPAIR     right  . TOTAL THYROIDECTOMY  2007   GOITER    There were no vitals filed for this visit.  Subjective Assessment - 01/27/19 0933    Subjective  Patient reports she is moving okay today because of the medicine. she did not sleep last night at all.    Pertinent History  Vertigo    Patient Stated Goals  have less pain    Currently in Pain?  Yes    Pain Score  7     Pain Location  Neck    Pain Orientation  Mid;Upper;Lower    Pain  Descriptors / Indicators  Aching                       [facility] Adult PT Treatment/Exercise - 01/27/19 0001      Exercises   Exercises  Lumbar      Lumbar Exercises: Stretches   Single Knee to Chest Stretch  Right;Left;1 rep;20 seconds    Lower Trunk Rotation  5 reps;10 seconds   bil    Piriformis Stretch  Right;Left;2 reps;30 seconds    Piriformis Stretch Limitations  left over right thigh with overpressure; right ankle on left knee with overpressure      Lumbar Exercises: Seated   Other Seated Lumbar Exercises  cerv and neck retraction x 10 ea; cat/cow 2x10     Other Seated Lumbar Exercises  scap row and ext red band x 10 ea      Lumbar Exercises: Supine   Pelvic Tilt  10 reps    Pelvic Tilt Limitations  with bed elevated    Bent Knee Raise  10 reps   each side   Bridge  10 reps    Bridge Limitations  minimal ROM      Modalities   Modalities  Electrical Stimulation;Moist Heat      Moist Heat Therapy   Number Minutes Moist Heat  15 Minutes    Moist Heat Location  Cervical;Lumbar Spine      Electrical Stimulation   Electrical Stimulation Location  C/T area    Electrical Stimulation Action  IFC    Electrical Stimulation Parameters  supine    Electrical Stimulation Goals  Pain             PT Education - 01/27/19 1008    Education Details  HEP    Person(s) Educated  Patient    Methods  Explanation;Handout;Demonstration    Comprehension  Verbalized understanding;Returned demonstration       PT Short Term Goals - 01/23/19 0923      PT SHORT TERM GOAL #1   Title  independent with initial HEP    Time  2    Period  Weeks    Status  New        PT Long Term Goals - 01/23/19 3875      PT LONG TERM GOAL #1   Title  understand posture and body mechanics    Time  8    Period  Weeks    Status  New      PT LONG TERM GOAL #2   Title  decrease pain 25%    Time  8    Period  Weeks    Status  New      PT LONG TERM GOAL #3   Title  increase  cervical ROM 25%    Time  8  Period  Weeks    Status  New      PT LONG TERM GOAL #4   Title  report able to walk without pain    Time  8    Period  Weeks    Status  New      PT LONG TERM GOAL #5   Title  decrease HA frequency 25%    Time  8    Period  Weeks    Status  New            Plan - 01/27/19 1010    Clinical Impression Statement  Pt tolerated treatment fairly well. Reps were limited by pain in most exercises.  She has vertigo and had difficulty with flat supine lying. Much better with head elevated. No goals met as only second visit.     Rehab Potential  Good    PT Frequency  2x / week    PT Duration  8 weeks    PT Treatment/Interventions  ADLs/Self Care Home Management;Cryotherapy;Electrical Stimulation;Moist Heat;Traction;Ultrasound;Therapeutic exercise;Therapeutic activities;Neuromuscular re-education;Patient/family education;Manual techniques;Dry needling    PT Next Visit Plan  slowly start exercises to get her moving and decrease spasms    PT Home Exercise Plan  LTR, piriformis stretches [email or web address]       Patient will benefit from skilled therapeutic intervention in order to improve the following deficits and impairments:  Abnormal gait, Improper body mechanics, Pain, Postural dysfunction, Increased muscle spasms, Decreased mobility, Decreased activity tolerance, Decreased range of motion, Decreased strength, Impaired flexibility, Difficulty walking  Visit Diagnosis: Cervicalgia  Cramp and spasm  Acute bilateral low back pain without sciatica     Problem List Patient Active Problem List   Diagnosis Date Noted  . Pulmonary congestion 01/19/2019  . Primary osteoarthritis of both knees 12/19/2018  . DDD (degenerative disc disease), lumbar 12/19/2018  . ANA positive 10/27/2018  . CAP (community acquired pneumonia) 03/06/2018  . Lower extremity pain, lateral, right 02/17/2018  . Post-menopausal bleeding 02/17/2018  . Cephalalgia 02/17/2018  . Anxiety  02/17/2018  . Peripheral arterial disease ([place]) 02/20/2017  . Facial skin lesion 01/28/2017  . Benign paroxysmal positional vertigo 08/05/2016  . Atypical chest pain 03/27/2016  . Esophageal reflux 03/27/2016  . Antiplatelet or antithrombotic long-term use 03/27/2016  . Pain in right axilla 03/13/2016  . Chest pain 03/07/2016  . Right lumbar radiculopathy 02/21/2016  . Abnormality of gait 02/21/2016  . Zinc deficiency 11/26/2015  . Chronic low back pain 10/26/2015  . Right hip pain 10/26/2015  . Medicare annual wellness visit, subsequent 03/13/2015  . Preventative health care 03/13/2015  . Sun-damaged skin 12/05/2014  . Angina of effort ([place]) 12/02/2014  . Hx of colonic polyps 09/28/2014  . Benign neoplasm of descending colon 09/28/2014  . Other fatigue 09/10/2014  . Personal history of colonic polyps 07/16/2014  . Bilateral hand pain 03/20/2014  . Postsurgical hypoparathyroidism ([place]) 08/03/2013  . Neck pain on right side 07/13/2013  . Constipation 05/31/2013  . Back pain 05/31/2013  . Nocturia 05/31/2013  . GERD (gastroesophageal reflux disease) 11/02/2012  . Tension headache 11/02/2012  . Anemia 06/11/2012  . Hypokalemia 06/11/2012  . Depression 01/21/2012  . Headache(784.0) 12/23/2011  . Bradycardia 12/07/2011  . Hypocalcemia 12/07/2011  . Acute MI, subendocardial ([place]) 11/20/2011  . Hypothyroidism 05/20/2009  . Essential hypertension, benign 05/20/2009  . Hyperlipidemia 03/08/2009  . CAD, NATIVE VESSEL 03/08/2009   PHYSICAL THERAPY DISCHARGE SUMMARY  Visits from Start of Care: 2  Plan: Patient agrees to discharge.  Patient goals were not met. Patient is being discharged due to not returning since the last visit.  ?????     Madelyn Flavors PT 01/27/2019, 10:14 AM  [place], [place], [address] Phone: [phone]   Fax:  [phone]  Name: [name] MRN:  [id number] Date of Birth: 1946-03-31

## 2019-01-29 ENCOUNTER — Other Ambulatory Visit (HOSPITAL_BASED_OUTPATIENT_CLINIC_OR_DEPARTMENT_OTHER): Payer: Medicare Other

## 2019-01-29 DIAGNOSIS — H02413 Mechanical ptosis of bilateral eyelids: Secondary | ICD-10-CM | POA: Diagnosis not present

## 2019-01-29 DIAGNOSIS — I519 Heart disease, unspecified: Secondary | ICD-10-CM | POA: Insufficient documentation

## 2019-01-29 DIAGNOSIS — H02829 Cysts of unspecified eye, unspecified eyelid: Secondary | ICD-10-CM | POA: Diagnosis not present

## 2019-01-29 DIAGNOSIS — H02423 Myogenic ptosis of bilateral eyelids: Secondary | ICD-10-CM | POA: Diagnosis not present

## 2019-01-29 DIAGNOSIS — H02831 Dermatochalasis of right upper eyelid: Secondary | ICD-10-CM | POA: Diagnosis not present

## 2019-01-29 DIAGNOSIS — D485 Neoplasm of uncertain behavior of skin: Secondary | ICD-10-CM | POA: Diagnosis not present

## 2019-02-03 ENCOUNTER — Ambulatory Visit: Payer: Medicare Other | Admitting: Physical Therapy

## 2019-02-03 ENCOUNTER — Ambulatory Visit (HOSPITAL_BASED_OUTPATIENT_CLINIC_OR_DEPARTMENT_OTHER)
Admission: RE | Admit: 2019-02-03 | Discharge: 2019-02-03 | Disposition: A | Discharge status: Home / Self Care | Payer: Medicare Other | Source: Ambulatory Visit | Attending: Family Medicine | Admitting: Family Medicine

## 2019-02-03 DIAGNOSIS — Z780 Asymptomatic menopausal state: Secondary | ICD-10-CM | POA: Insufficient documentation

## 2019-02-03 DIAGNOSIS — Z13820 Encounter for screening for osteoporosis: Secondary | ICD-10-CM | POA: Diagnosis not present

## 2019-02-03 DIAGNOSIS — R1011 Right upper quadrant pain: Secondary | ICD-10-CM | POA: Diagnosis not present

## 2019-02-05 ENCOUNTER — Ambulatory Visit: Payer: Medicare Other

## 2019-02-06 DIAGNOSIS — H53481 Generalized contraction of visual field, right eye: Secondary | ICD-10-CM | POA: Diagnosis not present

## 2019-02-06 DIAGNOSIS — H53483 Generalized contraction of visual field, bilateral: Secondary | ICD-10-CM | POA: Diagnosis not present

## 2019-02-06 DIAGNOSIS — H53482 Generalized contraction of visual field, left eye: Secondary | ICD-10-CM | POA: Diagnosis not present

## 2019-02-10 ENCOUNTER — Ambulatory Visit: Payer: Medicare Other | Admitting: Physical Therapy

## 2019-02-11 ENCOUNTER — Telehealth: Payer: Self-pay

## 2019-02-11 NOTE — Telephone Encounter (Signed)
Copied from [place] [id number]. Topic: General - Inquiry >> Feb 11, 2019 12:20 PM Euharlee D wrote: Reason for CRM: Pt called to follow up on results of [place] from 02/03/19. Please advise.

## 2019-02-11 NOTE — Telephone Encounter (Signed)
Not a patient at [place].  Please forward to correct clinic.

## 2019-02-12 NOTE — Telephone Encounter (Signed)
Spoke with patient about results she voiced her understanding

## 2019-02-13 ENCOUNTER — Ambulatory Visit: Payer: Medicare Other | Admitting: Physical Therapy

## 2019-02-17 ENCOUNTER — Ambulatory Visit: Payer: Medicare Other | Admitting: Physical Therapy

## 2019-02-20 ENCOUNTER — Ambulatory Visit: Payer: Medicare Other

## 2019-02-24 ENCOUNTER — Ambulatory Visit: Payer: Medicare Other | Admitting: Physical Therapy

## 2019-02-25 ENCOUNTER — Other Ambulatory Visit: Payer: Self-pay

## 2019-02-25 ENCOUNTER — Emergency Department (HOSPITAL_COMMUNITY): Payer: Medicare Other

## 2019-02-25 ENCOUNTER — Emergency Department (HOSPITAL_COMMUNITY)
Admission: EM | Admit: 2019-02-25 | Discharge: 2019-02-26 | Disposition: A | Discharge status: Home / Self Care | Payer: Medicare Other | Attending: Emergency Medicine | Admitting: Emergency Medicine

## 2019-02-25 DIAGNOSIS — R0602 Shortness of breath: Secondary | ICD-10-CM | POA: Diagnosis not present

## 2019-02-25 DIAGNOSIS — I2510 Atherosclerotic heart disease of native coronary artery without angina pectoris: Secondary | ICD-10-CM | POA: Diagnosis not present

## 2019-02-25 DIAGNOSIS — I4510 Unspecified right bundle-branch block: Secondary | ICD-10-CM | POA: Diagnosis not present

## 2019-02-25 DIAGNOSIS — E039 Hypothyroidism, unspecified: Secondary | ICD-10-CM | POA: Insufficient documentation

## 2019-02-25 DIAGNOSIS — Z7982 Long term (current) use of aspirin: Secondary | ICD-10-CM | POA: Insufficient documentation

## 2019-02-25 DIAGNOSIS — R0902 Hypoxemia: Secondary | ICD-10-CM | POA: Diagnosis not present

## 2019-02-25 DIAGNOSIS — R079 Chest pain, unspecified: Secondary | ICD-10-CM | POA: Diagnosis not present

## 2019-02-25 DIAGNOSIS — F419 Anxiety disorder, unspecified: Secondary | ICD-10-CM | POA: Insufficient documentation

## 2019-02-25 DIAGNOSIS — I10 Essential (primary) hypertension: Secondary | ICD-10-CM | POA: Diagnosis not present

## 2019-02-25 DIAGNOSIS — Z955 Presence of coronary angioplasty implant and graft: Secondary | ICD-10-CM | POA: Diagnosis not present

## 2019-02-25 DIAGNOSIS — F329 Major depressive disorder, single episode, unspecified: Secondary | ICD-10-CM | POA: Insufficient documentation

## 2019-02-25 DIAGNOSIS — R0789 Other chest pain: Secondary | ICD-10-CM

## 2019-02-25 DIAGNOSIS — I252 Old myocardial infarction: Secondary | ICD-10-CM | POA: Insufficient documentation

## 2019-02-25 DIAGNOSIS — Z79899 Other long term (current) drug therapy: Secondary | ICD-10-CM | POA: Insufficient documentation

## 2019-02-25 LAB — CBC
HCT: 40.1 % (ref 36.0–46.0)
Hemoglobin: 13.1 g/dL (ref 12.0–15.0)
MCH: 29 pg (ref 26.0–34.0)
MCHC: 32.7 g/dL (ref 30.0–36.0)
MCV: 88.7 fL (ref 80.0–100.0)
NRBC: 0 % (ref 0.0–0.2)
Platelets: 239 10*3/uL (ref 150–400)
RBC: 4.52 MIL/uL (ref 3.87–5.11)
RDW: 14.1 % (ref 11.5–15.5)
WBC: 8.3 10*3/uL (ref 4.0–10.5)

## 2019-02-25 LAB — BASIC METABOLIC PANEL
Anion gap: 7 (ref 5–15)
BUN: 20 mg/dL (ref 8–23)
CO2: 29 mmol/L (ref 22–32)
Calcium: 7.9 mg/dL — ABNORMAL LOW (ref 8.9–10.3)
Chloride: 104 mmol/L (ref 98–111)
Creatinine, Ser: 1 mg/dL (ref 0.44–1.00)
Glucose, Bld: 127 mg/dL — ABNORMAL HIGH (ref 70–99)
Potassium: 3.2 mmol/L — ABNORMAL LOW (ref 3.5–5.1)
Sodium: 140 mmol/L (ref 135–145)

## 2019-02-25 LAB — I-STAT TROPONIN, ED: Troponin i, poc: 0 ng/mL (ref 0.00–0.08)

## 2019-02-25 NOTE — ED Triage Notes (Signed)
Pt here with chest pain onset at rest at 2030 tonight. Took one nitro and it resolved. Pain came back, so pt took 3 baby aspirin. Pain gone at this time. Hx MI and 2 stents.

## 2019-02-25 NOTE — ED Provider Notes (Addendum)
[facility] EMERGENCY DEPARTMENT Provider Note   CSN: [id number] Arrival date & time: 02/25/19  2235    History   Chief Complaint Chief Complaint  Patient presents with  . Chest Pain    HPI [name] is a 73 y.o. female.     Patient with history of CAD status post 2 stents, GERD, hypertension, hyperlipidemia presents with episode of chest pain onset while resting tonight about 8:30 PM.  She reports episode of central pain that radiates to her left shoulder.  This lasted about 1 to 2 minutes.  She took 1 nitroglycerin but the pain returned after about 30 minutes so she called EMS.  She took 3 baby aspirin as well.  She reports second episode lasted about 1 or 2 minutes as well as now chest pain-free.  She reports this pain is atypical for her angina and she is never had this kind of pain before.  She describes the pain as sharp.  There is no shortness of breath, nausea, vomiting, diaphoresis.  No abdominal pain or back pain.  No leg pain or leg swelling.  The history is provided by the patient and the EMS personnel.  Chest Pain  Associated symptoms: shortness of breath   Associated symptoms: no abdominal pain, no dizziness, no fever, no headache, no nausea, no vomiting and no weakness     Past Medical History:  Diagnosis Date  . Acute MI, subendocardial ([place])   . Arthritis   . Back pain 05/31/2013  . Benign paroxysmal positional vertigo 08/05/2016  . CAD (coronary artery disease)    a. NSTEMI 10/2011: [place] 11/19/11: pLAD 30%, oOM 60%, AVCFX 30%, CFX after OM2 30%, pRCA 60 and 70%, then 99%, AM 80-90% with TIMI 3 flow.  PCI: Promus DES x 2 to RCA; b. 06/2012 Cath: patent RCA stents w/ subtl occl of Acute Marginal (jailed)->Med rx; c. 05/2015 MV: EF 59%, mod mid infsept/inf/ap lat/ap infarct with peri-infarct isch-->Med Rx; d. 02/2016 Cath: LM nl, LAD 71m, RI 50, RCA patent stents.  . Colitis   . Facial skin lesion 01/28/2017  . GERD (gastroesophageal reflux disease)     occasional  . Hyperlipidemia   . Hypertension   . Hypertensive heart disease    a. Echocardiogram 11/19/11: Difficult acoustic windows, EF 60-65%, normal LV wall thickness, grade 1 diastolic dysfunction  . Hypoparathyroidism ([place])   . Low zinc level 11/26/2016  . Multinodular thyroid    Goiter s/p thyroidectomy in 2007 with post-op  hypocalcemia and post-op hypothyroidism/hypoparathyroidism with hypocalcemia  . Neck pain on right side 07/13/2013  . Nocturia 05/31/2013  . Pain in right axilla 03/13/2016  . Palpitations    a. 03/2012 - patient set up for event monitor but did not wear correctly -  she declined wearing a repeat monitor  . Post-surgical hypothyroidism   . Sun-damaged skin 12/05/2014  . Tubular adenoma of colon 06/2011  . Unspecified constipation 05/31/2013  . Vertigo   . Zinc deficiency 11/26/2015    Patient Active Problem List   Diagnosis Date Noted  . Pulmonary congestion 01/19/2019  . Primary osteoarthritis of both knees 12/19/2018  . DDD (degenerative disc disease), lumbar 12/19/2018  . ANA positive 10/27/2018  . CAP (community acquired pneumonia) 03/06/2018  . Lower extremity pain, lateral, right 02/17/2018  . Post-menopausal bleeding 02/17/2018  . Cephalalgia 02/17/2018  . Anxiety 02/17/2018  . Peripheral arterial disease ([place]) 02/20/2017  . Facial skin lesion 01/28/2017  . Benign paroxysmal positional vertigo 08/05/2016  .  Atypical chest pain 03/27/2016  . Esophageal reflux 03/27/2016  . Antiplatelet or antithrombotic long-term use 03/27/2016  . Pain in right axilla 03/13/2016  . Chest pain 03/07/2016  . Right lumbar radiculopathy 02/21/2016  . Abnormality of gait 02/21/2016  . Zinc deficiency 11/26/2015  . Chronic low back pain 10/26/2015  . Right hip pain 10/26/2015  . Medicare annual wellness visit, subsequent 03/13/2015  . Preventative health care 03/13/2015  . Sun-damaged skin 12/05/2014  . Angina of effort ([place]) 12/02/2014  . Hx of colonic polyps  09/28/2014  . Benign neoplasm of descending colon 09/28/2014  . Other fatigue 09/10/2014  . Personal history of colonic polyps 07/16/2014  . Bilateral hand pain 03/20/2014  . Postsurgical hypoparathyroidism ([place]) 08/03/2013  . Neck pain on right side 07/13/2013  . Constipation 05/31/2013  . Back pain 05/31/2013  . Nocturia 05/31/2013  . GERD (gastroesophageal reflux disease) 11/02/2012  . Tension headache 11/02/2012  . Anemia 06/11/2012  . Hypokalemia 06/11/2012  . Depression 01/21/2012  . Headache(784.0) 12/23/2011  . Bradycardia 12/07/2011  . Hypocalcemia 12/07/2011  . Acute MI, subendocardial ([place]) 11/20/2011  . Hypothyroidism 05/20/2009  . Essential hypertension, benign 05/20/2009  . Hyperlipidemia 03/08/2009  . CAD, NATIVE VESSEL 03/08/2009    Past Surgical History:  Procedure Laterality Date  . CARDIAC CATHETERIZATION N/A 03/08/2016   Procedure: Left Heart Cath and Coronary Angiography;  Surgeon: Jettie Booze, MD;  Location: [facility];  Service: Cardiovascular;  Laterality: N/A;  . COLONOSCOPY  Aenomatous polyps   07/05/2011  . COLONOSCOPY N/A 09/28/2014   Procedure: COLONOSCOPY;  Surgeon: Ladene Artist, MD;  Location: [facility];  Service: Endoscopy;  Laterality: N/A;  . CORONARY ANGIOPLASTY WITH STENT PLACEMENT    . LEFT HEART CATH AND CORONARY ANGIOGRAPHY N/A 12/09/2018   Procedure: LEFT HEART CATH AND CORONARY ANGIOGRAPHY;  Surgeon: Jettie Booze, MD;  Location: [facility];  Service: Cardiovascular;  Laterality: N/A;  . LEFT HEART CATHETERIZATION WITH CORONARY ANGIOGRAM N/A 07/17/2012   Procedure: LEFT HEART CATHETERIZATION WITH CORONARY ANGIOGRAM;  Surgeon: Hillary Bow, MD;  Location: [facility] CATH LAB;  Service: Cardiovascular;  Laterality: N/A;  . PERCUTANEOUS CORONARY STENT INTERVENTION (PCI-S) N/A 11/19/2011   Procedure: PERCUTANEOUS CORONARY STENT INTERVENTION (PCI-S);  Surgeon: Hillary Bow, MD;  Location: [facility] CATH LAB;   Service: Cardiovascular;  Laterality: N/A;  . SHOULDER ARTHROSCOPY W/ ROTATOR CUFF REPAIR     right  . TOTAL THYROIDECTOMY  2007   GOITER     OB History    Gravida  4   Para  4   Term  4   Preterm      AB      Living  4     SAB      TAB      Ectopic      Multiple      Live Births  4            Home Medications    Prior to Admission medications   Medication Sig Start Date End Date Taking? Authorizing Provider  ALPRAZolam (XANAX) 0.25 MG tablet Take 1 tablet (0.25 mg total) by mouth 2 (two) times daily as needed for anxiety. 01/19/19   Mosie Lukes, MD  amLODipine (NORVASC) 10 MG tablet Take 1 tablet (10 mg total) by mouth daily. 10/07/18   Fay Records, MD  aspirin EC 81 MG tablet Take 81 mg by mouth daily.   11/22/11   Charlie Pitter, PA-C  B Complex-C (B-COMPLEX WITH VITAMIN C) tablet Take 1 tablet by mouth daily.    [provider]  calcitRIOL (ROCALTROL) 0.25 MCG capsule TAKE 3 CAPSULES(0.75 MCG) BY MOUTH DAILY 05/20/18   Fay Records, MD  carvedilol (COREG) 3.125 MG tablet Take 1 tablet (3.125 mg total) by mouth 2 (two) times daily. 05/20/18   Fay Records, MD  cholecalciferol (VITAMIN D) 1000 UNITS tablet Take 1,000 Units by mouth daily.    [provider]  clopidogrel (PLAVIX) 75 MG tablet Take 1 tablet (75 mg total) by mouth daily. 05/20/18   Fay Records, MD  furosemide (LASIX) 40 MG tablet Take 1 tablet (40 mg total) by mouth daily. 01/19/19   Mosie Lukes, MD  gabapentin (NEURONTIN) 300 MG capsule Take 1 capsule (300 mg total) by mouth 3 (three) times daily as needed (nerve pain). 01/19/19   Mosie Lukes, MD  isosorbide mononitrate (IMDUR) 60 MG 24 hr tablet Take 1 tablet (60 mg total) by mouth daily. 10/07/18   Fay Records, MD  ketoconazole (NIZORAL) 2 % cream Apply 1 fingertip amount to each foot daily. 05/01/18   Evelina Bucy, DPM  levothyroxine (SYNTHROID, LEVOTHROID) 100 MCG tablet TAKE 1 TABLET(100 MCG) BY MOUTH  DAILY Patient taking differently: Take 100 mcg by mouth every morning.  06/02/18   Mosie Lukes, MD  Multiple Vitamin (MULTIVITAMIN WITH MINERALS) TABS tablet Take 1 tablet by mouth daily.    [provider]  nitroGLYCERIN (NITROSTAT) 0.4 MG SL tablet DISSOLVE 1 TABLET UNDER THE TONGUE EVERY 5 MINUTES AS NEEDED FOR CHEST PAIN AS DIRECTED Patient not taking: No sig reported 06/09/18   Mosie Lukes, MD  pantoprazole (PROTONIX) 40 MG tablet Take 1 tablet (40 mg total) by mouth daily. 05/20/18   Fay Records, MD  REPATHA SURECLICK 702 MG/ML SOAJ ADMINISTER 1 ML UNDER THE SKIN EVERY 14 DAYS Patient taking differently: Inject 1 mL into the skin every 14 (fourteen) days.  10/13/18   Fay Records, MD  rosuvastatin (CRESTOR) 40 MG tablet Take 1 tablet (40 mg total) by mouth daily. 05/20/18   Fay Records, MD  tiZANidine (ZANAFLEX) 2 MG tablet Take 1-2 tablets (2-4 mg total) by mouth every 8 (eight) hours as needed for muscle spasms. 01/19/19   Mosie Lukes, MD    Family History Family History  Problem Relation Age of Onset  . Colon cancer Brother   . Cancer Brother        COLON  . Hypertension Father   . Heart disease Father   . Heart attack Father   . Blindness Sister   . Congestive Heart Failure Sister   . Hypertension Sister   . Healthy Daughter   . Healthy Son   . Thyroid disease Sister   . Cancer Brother        multiple myelomas  . Healthy Daughter   . Healthy Daughter   . Diabetes Neg Hx   . Prostate cancer Neg Hx   . Breast cancer Neg Hx     Social History Social History   Tobacco Use  . Smoking status: Never Smoker  . Smokeless tobacco: Never Used  Substance Use Topics  . Alcohol use: Yes    Comment: rare once per month  . Drug use: No     Allergies   Zetia [ezetimibe]   Review of Systems Review of Systems  Constitutional: Negative for activity change, appetite change and fever.  HENT: Negative  for congestion and rhinorrhea.   Respiratory:  Positive for chest tightness and shortness of breath.   Cardiovascular: Positive for chest pain.  Gastrointestinal: Negative for abdominal pain, nausea and vomiting.  Genitourinary: Negative for dysuria and hematuria.  Musculoskeletal: Negative for arthralgias and myalgias.  Skin: Negative for rash.  Neurological: Negative for dizziness, weakness and headaches.    all other systems are negative except as noted in the HPI and PMH.    Physical Exam Updated Vital Signs BP (!) 125/111   Pulse 65   Temp 98.3 F (36.8 C) (Oral)   Resp 17   Ht 5\' 5"  (1.651 m)   SpO2 95%   BMI 33.25 kg/m   Physical Exam Vitals signs and nursing note reviewed.  Constitutional:      General: She is not in acute distress.    Appearance: She is well-developed.  HENT:     Head: Normocephalic and atraumatic.     Mouth/Throat:     Pharynx: No oropharyngeal exudate.  Eyes:     Conjunctiva/sclera: Conjunctivae normal.     Pupils: Pupils are equal, round, and reactive to light.  Neck:     Musculoskeletal: Normal range of motion and neck supple.     Comments: No meningismus. Cardiovascular:     Rate and Rhythm: Normal rate and regular rhythm.     Heart sounds: Normal heart sounds. No murmur.  Pulmonary:     Effort: Pulmonary effort is normal. No respiratory distress.     Breath sounds: Normal breath sounds.  Chest:     Chest wall: No tenderness.  Abdominal:     Palpations: Abdomen is soft.     Tenderness: There is no abdominal tenderness. There is no guarding or rebound.  Musculoskeletal: Normal range of motion.        General: No tenderness.  Skin:    General: Skin is warm.     Capillary Refill: Capillary refill takes less than 2 seconds.  Neurological:     General: No focal deficit present.     Mental Status: She is alert and oriented to person, place, and time. Mental status is at baseline.     Cranial Nerves: No cranial nerve deficit.     Motor: No abnormal muscle tone.     Coordination:  Coordination normal.     Comments: No ataxia on finger to nose bilaterally. No pronator drift. 5/5 strength throughout. CN 2-12 intact.Equal grip strength. Sensation intact.   Psychiatric:        Behavior: Behavior normal.      ED Treatments / Results  Labs (all labs ordered are listed, but only abnormal results are displayed) Labs Reviewed  BASIC METABOLIC PANEL - Abnormal; Notable for the following components:      Result Value   Potassium 3.2 (*)    Glucose, Bld 127 (*)    Calcium 7.9 (*)    All other components within normal limits  CBC  I-STAT TROPONIN, ED  I-STAT TROPONIN, ED    EKG EKG Interpretation  Date/Time:  Wednesday February 25 2019 22:44:18 EST Ventricular Rate:  67 PR Interval:    QRS Duration: 152 QT Interval:  463 QTC Calculation: 489 R Axis:   9 Text Interpretation:  Sinus rhythm Right bundle branch block Confirmed by Veryl Speak [phone]) on 02/25/2019 10:47:58 PM   Radiology Dg Chest 2 View  Result Date: 02/25/2019 CLINICAL DATA:  Chest pain EXAM: CHEST - 2 VIEW COMPARISON:  None. FINDINGS: The heart size and mediastinal contours  are within normal limits. Both lungs are clear. The visualized skeletal structures are unremarkable. IMPRESSION: No active cardiopulmonary disease. Electronically Signed   By: Ulyses Jarred M.D.   On: 02/25/2019 23:27    Procedures Procedures (including critical care time)  Medications Ordered in ED Medications - No data to display   Initial Impression / Assessment and Plan / ED Course  I have reviewed the triage vital signs and the nursing notes.  Pertinent labs & imaging results that were available during my care of the patient were reviewed by me and considered in my medical decision making (see chart for details).       Episode of chest pain x2. Lasting 1-2 minutes at a time. Now resolved. Did receive nitroglycerin. EKG with unchanged RBBB. Seems atypical for ACS. ASA and NTG given. Remains chest pain free. Heart  score 3-4  [facility] cath 12/19  Previously placed Prox RCA to Dist RCA stent (unknown type) is widely patent.  Acute Mrg lesion is 100% stenosed. Left to right collaterals.  Ost Ramus lesion is 40% stenosed.  Prox LAD lesion is 25% stenosed.  Ost 1st Diag lesion is 25% stenosed.  The left ventricular systolic function is normal.  LV end diastolic pressure is normal.  The left ventricular ejection fraction is 55-65% by visual estimate.  There is no aortic valve stenosis.    Patient's pain description is atypical for ACS. Heart score 3-4 based on age and risk factors.  It lasted for about 1 to 2 minutes and is since resolved.  Her cath in December 2019 as above was reassuring.  Discussed with cardiology Dr. Elson Areas who agrees with serial troponins and likely discharge with outpatient follow-up.  Second troponin negative.  Patient with no further episodes of chest pain.  Low suspicion for PE aortic dissection.  Discussed follow-up with PCP as well as cardiology for follow-up tomorrow.  Return precautions discussed including continuing chest pain longer than 20 minutes, exertional chest pain, shortness of breath, sweating, vomiting, any other concerns. Final Clinical Impressions(s) / ED Diagnoses   Final diagnoses:  Atypical chest pain    ED Discharge Orders    None       Latoyna Hird, Annie Main, MD 02/26/19 [id number]    Ezequiel Essex, MD 02/26/19 [phone]

## 2019-02-26 ENCOUNTER — Encounter: Payer: Medicare Other | Admitting: Physical Therapy

## 2019-02-26 ENCOUNTER — Ambulatory Visit (INDEPENDENT_AMBULATORY_CARE_PROVIDER_SITE_OTHER): Payer: Medicare Other | Admitting: Podiatry

## 2019-02-26 ENCOUNTER — Encounter: Payer: Self-pay | Admitting: Podiatry

## 2019-02-26 DIAGNOSIS — D689 Coagulation defect, unspecified: Secondary | ICD-10-CM

## 2019-02-26 DIAGNOSIS — E1151 Type 2 diabetes mellitus with diabetic peripheral angiopathy without gangrene: Secondary | ICD-10-CM

## 2019-02-26 DIAGNOSIS — Z79899 Other long term (current) drug therapy: Secondary | ICD-10-CM

## 2019-02-26 DIAGNOSIS — R0789 Other chest pain: Secondary | ICD-10-CM | POA: Diagnosis not present

## 2019-02-26 DIAGNOSIS — B353 Tinea pedis: Secondary | ICD-10-CM

## 2019-02-26 DIAGNOSIS — L84 Corns and callosities: Secondary | ICD-10-CM | POA: Diagnosis not present

## 2019-02-26 DIAGNOSIS — B351 Tinea unguium: Secondary | ICD-10-CM

## 2019-02-26 LAB — I-STAT TROPONIN, ED: Troponin i, poc: 0.01 ng/mL (ref 0.00–0.08)

## 2019-02-26 MED ORDER — FLUCONAZOLE 150 MG PO TABS: 150.0000 mg | ORAL_TABLET | ORAL | 1 refills | Status: DC

## 2019-02-26 NOTE — Discharge Instructions (Signed)
Your testing shows no evidence of a heart attack. Call Dr. Harrington Challenger in the morning to schedule an appointment.  Return to the ED if her chest pain becomes lasts for 20-30 minutes at ttime, exertional, associated shortness of breath, nausea, vomiting, sweating, or If you have any other concerns.

## 2019-02-26 NOTE — Progress Notes (Signed)
Subjective:  Patient ID: [name], female    DOB: [birth year],  MRN: [id number]  Chief Complaint  Patient presents with  . Nail Problem    Bilateral nails 2-5; "nails are discolored and thick; x3mo; also want to talk about skin on bottom of heels, been itching alot lately; tried everything for relief"    73 y.o. female presents  for diabetic foot care. On plavix. Complains of rough skin on feet and thickened nails. Denies numbness and tingling in their feet. Denies cramping in legs and thighs.  Review of Systems: Negative except as noted in the HPI. Denies N/V/F/Ch.  Past Medical History:  Diagnosis Date  . Acute MI, subendocardial ([place])   . Arthritis   . Back pain 05/31/2013  . Benign paroxysmal positional vertigo 08/05/2016  . CAD (coronary artery disease)    a. NSTEMI 10/2011: [place] 11/19/11: pLAD 30%, oOM 60%, AVCFX 30%, CFX after OM2 30%, pRCA 60 and 70%, then 99%, AM 80-90% with TIMI 3 flow.  PCI: Promus DES x 2 to RCA; b. 06/2012 Cath: patent RCA stents w/ subtl occl of Acute Marginal (jailed)->Med rx; c. 05/2015 MV: EF 59%, mod mid infsept/inf/ap lat/ap infarct with peri-infarct isch-->Med Rx; d. 02/2016 Cath: LM nl, LAD 39m, RI 50, RCA patent stents.  . Colitis   . Facial skin lesion 01/28/2017  . GERD (gastroesophageal reflux disease)    occasional  . Hyperlipidemia   . Hypertension   . Hypertensive heart disease    a. Echocardiogram 11/19/11: Difficult acoustic windows, EF 60-65%, normal LV wall thickness, grade 1 diastolic dysfunction  . Hypoparathyroidism ([place])   . Low zinc level 11/26/2016  . Multinodular thyroid    Goiter s/p thyroidectomy in 2007 with post-op  hypocalcemia and post-op hypothyroidism/hypoparathyroidism with hypocalcemia  . Neck pain on right side 07/13/2013  . Nocturia 05/31/2013  . Pain in right axilla 03/13/2016  . Palpitations    a. 03/2012 - patient set up for event monitor but did not wear correctly -  she declined wearing a repeat monitor  .  Post-surgical hypothyroidism   . Sun-damaged skin 12/05/2014  . Tubular adenoma of colon 06/2011  . Unspecified constipation 05/31/2013  . Vertigo   . Zinc deficiency 11/26/2015    Current Outpatient Medications:  .  ALPRAZolam (XANAX) 0.25 MG tablet, Take 1 tablet (0.25 mg total) by mouth 2 (two) times daily as needed for anxiety., Disp: 30 tablet, Rfl: 0 .  amLODipine (NORVASC) 10 MG tablet, Take 1 tablet (10 mg total) by mouth daily., Disp: 90 tablet, Rfl: 3 .  aspirin EC 81 MG tablet, Take 81 mg by mouth daily.  , Disp: , Rfl:  .  B Complex-C (B-COMPLEX WITH VITAMIN C) tablet, Take 1 tablet by mouth daily., Disp: , Rfl:  .  calcitRIOL (ROCALTROL) 0.25 MCG capsule, TAKE 3 CAPSULES(0.75 MCG) BY MOUTH DAILY (Patient taking differently: Take 0.75 mcg by mouth daily. ), Disp: 270 capsule, Rfl: 3 .  carvedilol (COREG) 3.125 MG tablet, Take 1 tablet (3.125 mg total) by mouth 2 (two) times daily., Disp: 180 tablet, Rfl: 3 .  cholecalciferol (VITAMIN D) 1000 UNITS tablet, Take 1,000 Units by mouth daily., Disp: , Rfl:  .  clopidogrel (PLAVIX) 75 MG tablet, Take 1 tablet (75 mg total) by mouth daily., Disp: 90 tablet, Rfl: 3 .  furosemide (LASIX) 40 MG tablet, Take 1 tablet (40 mg total) by mouth daily., Disp: 90 tablet, Rfl: 1 .  gabapentin (NEURONTIN) 300 MG capsule, Take 1 capsule (  300 mg total) by mouth 3 (three) times daily as needed (nerve pain)., Disp: 90 capsule, Rfl: 1 .  isosorbide mononitrate (IMDUR) 60 MG 24 hr tablet, Take 1 tablet (60 mg total) by mouth daily., Disp: 90 tablet, Rfl: 3 .  ketoconazole (NIZORAL) 2 % cream, Apply 1 fingertip amount to each foot daily., Disp: 30 g, Rfl: 0 .  levothyroxine (SYNTHROID, LEVOTHROID) 100 MCG tablet, TAKE 1 TABLET(100 MCG) BY MOUTH DAILY (Patient taking differently: Take 100 mcg by mouth daily before breakfast. ), Disp: 90 tablet, Rfl: 0 .  Multiple Vitamin (MULTIVITAMIN WITH MINERALS) TABS tablet, Take 1 tablet by mouth daily., Disp: , Rfl:  .   nitroGLYCERIN (NITROSTAT) 0.4 MG SL tablet, DISSOLVE 1 TABLET UNDER THE TONGUE EVERY 5 MINUTES AS NEEDED FOR CHEST PAIN AS DIRECTED (Patient taking differently: Place 0.4 mg under the tongue every 5 (five) minutes as needed for chest pain. ), Disp: 75 tablet, Rfl: 0 .  pantoprazole (PROTONIX) 40 MG tablet, Take 1 tablet (40 mg total) by mouth daily., Disp: 90 tablet, Rfl: 3 .  REPATHA SURECLICK 536 MG/ML SOAJ, ADMINISTER 1 ML UNDER THE SKIN EVERY 14 DAYS (Patient taking differently: Inject 1 mL into the skin every 14 (fourteen) days. ), Disp: 6 pen, Rfl: 1 .  rosuvastatin (CRESTOR) 40 MG tablet, Take 1 tablet (40 mg total) by mouth daily., Disp: 90 tablet, Rfl: 3 .  tiZANidine (ZANAFLEX) 2 MG tablet, Take 1-2 tablets (2-4 mg total) by mouth every 8 (eight) hours as needed for muscle spasms., Disp: 40 tablet, Rfl: 1 .  fluconazole (DIFLUCAN) 150 MG tablet, Take 1 tablet (150 mg total) by mouth once a week., Disp: 6 tablet, Rfl: 1  Social History   Tobacco Use  Smoking Status Never Smoker  Smokeless Tobacco Never Used    Allergies  Allergen Reactions  . Zetia [Ezetimibe] Other (See Comments)    Myalgia, paresthesias and weakness   Objective:  There were no vitals filed for this visit. There is no height or weight on file to calculate BMI. Constitutional Well developed. Well nourished.  Vascular Dorsalis pedis pulses present 1+ bilaterally  Posterior tibial pulses barely palpable bilaterally  Pedal hair growth diminished. Capillary refill normal to all digits.  No cyanosis or clubbing noted.  Neurologic Normal speech. Oriented to person, place, and time. Epicritic sensation to light touch grossly present bilaterally. Protective sensation with 5.07 monofilament  present bilaterally.  Dermatologic Nails elongated, thickened, dystrophic. Xerosis with scaling plantar foot bilat. No open wounds. HPK R medial hallux IPJ.  Orthopedic: Normal joint ROM without pain or crepitus  bilaterally. No visible deformities. No bony tenderness.   Assessment:   1. Diabetes mellitus type 2 with peripheral artery disease ([place])   2. Coagulation defect ([place])   3. Onychomycosis   4. Encounter for long-term (current) use of high-risk medication   5. Tinea pedis of both feet   6. Callus    Plan:  Patient was evaluated and treated and all questions answered.  Diabetes with coagulation defect, Onychomycosis -Educated on diabetic footcare. Diabetic risk level 1 -Nails x10 debrided sharply and manually with large nail nipper and rotary burr.   Procedure: Nail Debridement Rationale: Patient meets criteria for routine foot care due to coagulation defect Type of Debridement: manual, sharp debridement. Instrumentation: Nail nipper, rotary burr. Number of Nails: 10   Procedure: Paring of Lesion Rationale: painful hyperkeratotic lesion Type of Debridement: manual, sharp debridement. Instrumentation: 312 blade Number of Lesions: 1   Tinea Pedis/Onychomycosis -  Rx Fluconazole weekly. Educated on r/b/a of medication.    No follow-ups on file.

## 2019-03-04 ENCOUNTER — Other Ambulatory Visit: Payer: Self-pay | Admitting: Internal Medicine

## 2019-03-04 DIAGNOSIS — E876 Hypokalemia: Secondary | ICD-10-CM

## 2019-03-04 DIAGNOSIS — E039 Hypothyroidism, unspecified: Secondary | ICD-10-CM

## 2019-03-04 DIAGNOSIS — I10 Essential (primary) hypertension: Secondary | ICD-10-CM

## 2019-03-06 ENCOUNTER — Other Ambulatory Visit: Payer: Self-pay | Admitting: Internal Medicine

## 2019-03-16 ENCOUNTER — Telehealth: Payer: Self-pay | Admitting: Family Medicine

## 2019-03-16 NOTE — Telephone Encounter (Signed)
Copied from [place] [id number]. Topic: Quick Communication - Rx Refill/Question >> Mar 16, 2019  8:33 AM Scherrie Gerlach wrote: Medication: levothyroxine (SYNTHROID, LEVOTHROID) 100 MCG tablet  Pt states she called the pharmacy last week. Pt states she has no more after today. Requesting 90 day  [id number] - [place], [place] [address] AT [facility] OF [address] [phone] (Phone) [phone] (Fax)

## 2019-03-17 ENCOUNTER — Other Ambulatory Visit: Payer: Self-pay | Admitting: Family Medicine

## 2019-03-17 NOTE — Telephone Encounter (Signed)
Pt calling back as she states she has no medicine for today, and is very anxious. Pt asked if you can please send to the pharmacy.

## 2019-03-20 MED ORDER — LEVOTHYROXINE SODIUM 100 MCG PO TABS: ORAL_TABLET | ORAL | 1 refills | Status: DC

## 2019-03-20 NOTE — Telephone Encounter (Signed)
meds sent in

## 2019-03-20 NOTE — Addendum Note (Signed)
Addended by: Magdalene Molly A on: 03/20/2019 11:07 AM   Modules accepted: Orders

## 2019-04-07 ENCOUNTER — Telehealth: Payer: Self-pay

## 2019-04-07 NOTE — Telephone Encounter (Signed)
Pts husband, [name] has appt directly before her which she will be facilitating.      Virtual Visit Pre-Appointment Phone Call  Steps For Call:  1. Confirm consent - "In the setting of the current Covid19 crisis, you are scheduled for a VIDEO visit with your provider on 4/20 at 2pm.  Just as we do with many in-office visits, in order for you to participate in this visit, we must obtain consent.  If you'd like, I can send this to your mychart (if signed up) or email for you to review.  Otherwise, I can obtain your verbal consent now.  All virtual visits are billed to your insurance company just like a normal visit would be.  By agreeing to a virtual visit, we'd like you to understand that the technology does not allow for your provider to perform an examination, and thus may limit your provider's ability to fully assess your condition.  Finally, though the technology is pretty good, we cannot assure that it will always work on either your or our end, and in the setting of a video visit, we may have to convert it to a phone-only visit.  In either situation, we cannot ensure that we have a secure connection.  Are you willing to proceed?" STAFF: Did the patient verbally acknowledge consent to telehealth visit? Document YES/NO here: YES  2. Confirm the BEST phone number to call the day of the visit by including in appointment notes  3. Give patient instructions for WebEx/MyChart download to smartphone as below or Doximity/Doxy.me if video visit (depending on what platform provider is using)  4. Advise patient to be prepared with their blood pressure, heart rate, weight, any heart rhythm information, their current medicines, and a piece of paper and pen handy for any instructions they may receive the day of their visit  5. Inform patient they will receive a phone call 15 minutes prior to their appointment time (may be from unknown caller ID) so they should be prepared to answer  6. Confirm that  appointment type is correct in Epic appointment notes (VIDEO vs PHONE)     TELEPHONE CALL NOTE  [name] has been deemed a candidate for a follow-up tele-health visit to limit community exposure during the Covid-19 pandemic. I spoke with the patient via phone to ensure availability of phone/video source, confirm preferred email & phone number, and discuss instructions and expectations.  I reminded [name] to be prepared with any vital sign and/or heart rhythm information that could potentially be obtained via home monitoring, at the time of her visit. I reminded [name] to expect a phone call at the time of her visit if her visit.  Wilma Flavin, RN 04/07/2019 4:24 PM .

## 2019-04-13 ENCOUNTER — Telehealth (INDEPENDENT_AMBULATORY_CARE_PROVIDER_SITE_OTHER): Payer: Medicare Other | Admitting: Internal Medicine

## 2019-04-13 ENCOUNTER — Encounter: Payer: Self-pay | Admitting: Internal Medicine

## 2019-04-13 ENCOUNTER — Other Ambulatory Visit: Payer: Self-pay

## 2019-04-13 VITALS — BP 160/85 | HR 75 | Ht 65.0 in

## 2019-04-13 DIAGNOSIS — I2510 Atherosclerotic heart disease of native coronary artery without angina pectoris: Secondary | ICD-10-CM

## 2019-04-13 DIAGNOSIS — E782 Mixed hyperlipidemia: Secondary | ICD-10-CM

## 2019-04-13 DIAGNOSIS — I10 Essential (primary) hypertension: Secondary | ICD-10-CM

## 2019-04-13 DIAGNOSIS — E876 Hypokalemia: Secondary | ICD-10-CM

## 2019-04-13 MED ORDER — BISOPROLOL FUMARATE 5 MG PO TABS: 5.0000 mg | ORAL_TABLET | Freq: Two times a day (BID) | ORAL | 3 refills | Status: DC

## 2019-04-13 NOTE — Patient Instructions (Signed)
Medication Instructions:  Increase bisoprolol to 5 mg two times every day If you need a refill on your cardiac medications before your next appointment, please call your pharmacy.   Lab work: none  Testing/Procedures: none  . Follow-Up: . Next week, call the office with your blood pressure readings.  Any Other Special Instructions Will Be Listed Below (If Applicable).

## 2019-04-13 NOTE — Progress Notes (Signed)
Virtual Visit via Telephone Note   This visit type was conducted due to national recommendations for restrictions regarding the COVID-19 Pandemic (e.g. social distancing) in an effort to limit this patient's exposure and mitigate transmission in our community.  Due to her co-morbid illnesses, this patient is at least at moderate risk for complications without adequate follow up.  This format is felt to be most appropriate for this patient at this time.  The patient did not have access to video technology/had technical difficulties with video requiring transitioning to audio format only (telephone).  All issues noted in this document were discussed and addressed.  No physical exam could be performed with this format.  Please refer to the patient's chart for her  consent to telehealth for [facility].   Evaluation Performed:  Follow-up visit  Date:  04/13/2019   ID:  [name], DOB 08-10-1946, MRN [id number]  Patient Location: Home Provider Location: Home  PCP:  Mosie Lukes, MD  Cardiologist:  Dorris Carnes, MD  Electrophysiologist:  None   Chief Complaint:    History of Present Illness:    [name] is a 73 y.o. female with CAD - s/p NSTEMI 10/2011 with DESx2 to RCA and normal EF. She underwent repeat LHC on 07/17/12 Patent RCA stent LCA with collaterals to R acute marginal were unchanged. Subtotal occlusion of acute marginal (exits mid stent). Recomm for medical Rx. Other issues include thyroidectomy, GERD, HTN, HLD, vertigo.Last myoviue showed inferolateral scar with minimal periinfarct ischemia  I saw the pt in Dec 2019   She had been to ED just prior for CP   Felt GI in origin  Since seen, the pt was seen once in ED in March for CP    She has not had any since   Not felt to be cardiac  The pt denies CP    Breathing is OK   No edema  The patient does not have symptoms concerning for COVID-19 infection (fever, chills, cough, or new shortness of breath).    Past  Medical History:  Diagnosis Date  . Acute MI, subendocardial ([place])   . Arthritis   . Back pain 05/31/2013  . Benign paroxysmal positional vertigo 08/05/2016  . CAD (coronary artery disease)    a. NSTEMI 10/2011: [place] 11/19/11: pLAD 30%, oOM 60%, AVCFX 30%, CFX after OM2 30%, pRCA 60 and 70%, then 99%, AM 80-90% with TIMI 3 flow.  PCI: Promus DES x 2 to RCA; b. 06/2012 Cath: patent RCA stents w/ subtl occl of Acute Marginal (jailed)->Med rx; c. 05/2015 MV: EF 59%, mod mid infsept/inf/ap lat/ap infarct with peri-infarct isch-->Med Rx; d. 02/2016 Cath: LM nl, LAD 51m, RI 50, RCA patent stents.  . Colitis   . Facial skin lesion 01/28/2017  . GERD (gastroesophageal reflux disease)    occasional  . Hyperlipidemia   . Hypertension   . Hypertensive heart disease    a. Echocardiogram 11/19/11: Difficult acoustic windows, EF 60-65%, normal LV wall thickness, grade 1 diastolic dysfunction  . Hypoparathyroidism ([place])   . Low zinc level 11/26/2016  . Multinodular thyroid    Goiter s/p thyroidectomy in 2007 with post-op  hypocalcemia and post-op hypothyroidism/hypoparathyroidism with hypocalcemia  . Neck pain on right side 07/13/2013  . Nocturia 05/31/2013  . Pain in right axilla 03/13/2016  . Palpitations    a. 03/2012 - patient set up for event monitor but did not wear correctly -  she declined wearing a repeat monitor  . Post-surgical  hypothyroidism   . Sun-damaged skin 12/05/2014  . Tubular adenoma of colon 06/2011  . Unspecified constipation 05/31/2013  . Vertigo   . Zinc deficiency 11/26/2015   Past Surgical History:  Procedure Laterality Date  . CARDIAC CATHETERIZATION N/A 03/08/2016   Procedure: Left Heart Cath and Coronary Angiography;  Surgeon: Jettie Booze, MD;  Location: [facility];  Service: Cardiovascular;  Laterality: N/A;  . COLONOSCOPY  Aenomatous polyps   07/05/2011  . COLONOSCOPY N/A 09/28/2014   Procedure: COLONOSCOPY;  Surgeon: Ladene Artist, MD;  Location: [facility];   Service: Endoscopy;  Laterality: N/A;  . CORONARY ANGIOPLASTY WITH STENT PLACEMENT    . LEFT HEART CATH AND CORONARY ANGIOGRAPHY N/A 12/09/2018   Procedure: LEFT HEART CATH AND CORONARY ANGIOGRAPHY;  Surgeon: Jettie Booze, MD;  Location: [place];  Service: Cardiovascular;  Laterality: N/A;  . LEFT HEART CATHETERIZATION WITH CORONARY ANGIOGRAM N/A 07/17/2012   Procedure: LEFT HEART CATHETERIZATION WITH CORONARY ANGIOGRAM;  Surgeon: Hillary Bow, MD;  Location: [facility] CATH LAB;  Service: Cardiovascular;  Laterality: N/A;  . PERCUTANEOUS CORONARY STENT INTERVENTION (PCI-S) N/A 11/19/2011   Procedure: PERCUTANEOUS CORONARY STENT INTERVENTION (PCI-S);  Surgeon: Hillary Bow, MD;  Location: [facility] CATH LAB;  Service: Cardiovascular;  Laterality: N/A;  . SHOULDER ARTHROSCOPY W/ ROTATOR CUFF REPAIR     right  . TOTAL THYROIDECTOMY  2007   GOITER     Current Meds  Medication Sig  . ALPRAZolam (XANAX) 0.25 MG tablet Take 1 tablet (0.25 mg total) by mouth 2 (two) times daily as needed for anxiety.  Marland Kitchen amLODipine (NORVASC) 10 MG tablet Take 1 tablet (10 mg total) by mouth daily.  Marland Kitchen aspirin EC 81 MG tablet Take 81 mg by mouth daily.    . B Complex-C (B-COMPLEX WITH VITAMIN C) tablet Take 1 tablet by mouth daily.  . calcitRIOL (ROCALTROL) 0.25 MCG capsule TAKE 3 CAPSULES(0.75 MCG) BY MOUTH DAILY (Patient taking differently: Take 0.75 mcg by mouth daily. )  . carvedilol (COREG) 3.125 MG tablet Take 1 tablet (3.125 mg total) by mouth 2 (two) times daily.  . cholecalciferol (VITAMIN D) 1000 UNITS tablet Take 1,000 Units by mouth daily.  . clopidogrel (PLAVIX) 75 MG tablet TAKE 1 TABLET(75 MG) BY MOUTH DAILY  . Evolocumab (REPATHA SURECLICK) 258 MG/ML SOAJ Inject 1 pen into the skin every 14 (fourteen) days.  . furosemide (LASIX) 40 MG tablet Take 1 tablet (40 mg total) by mouth daily.  Marland Kitchen gabapentin (NEURONTIN) 300 MG capsule Take 1 capsule (300 mg total) by mouth 3 (three) times daily as  needed (nerve pain).  . isosorbide mononitrate (IMDUR) 60 MG 24 hr tablet Take 1 tablet (60 mg total) by mouth daily.  Marland Kitchen ketoconazole (NIZORAL) 2 % cream Apply 1 fingertip amount to each foot daily.  Marland Kitchen levothyroxine (SYNTHROID, LEVOTHROID) 100 MCG tablet Take 1 tablet by mouth daily  . Multiple Vitamin (MULTIVITAMIN WITH MINERALS) TABS tablet Take 1 tablet by mouth daily.  . nitroGLYCERIN (NITROSTAT) 0.4 MG SL tablet DISSOLVE 1 TABLET UNDER THE TONGUE EVERY 5 MINUTES AS NEEDED FOR CHEST PAIN AS DIRECTED (Patient taking differently: Place 0.4 mg under the tongue every 5 (five) minutes as needed for chest pain. )  . pantoprazole (PROTONIX) 40 MG tablet Take 1 tablet (40 mg total) by mouth daily.  . rosuvastatin (CRESTOR) 40 MG tablet TAKE 1 TABLET(40 MG) BY MOUTH DAILY  . tiZANidine (ZANAFLEX) 2 MG tablet Take 1-2 tablets (2-4 mg total) by  mouth every 8 (eight) hours as needed for muscle spasms.     Allergies:   Zetia [ezetimibe]   Social History   Tobacco Use  . Smoking status: Never Smoker  . Smokeless tobacco: Never Used  Substance Use Topics  . Alcohol use: Yes    Comment: rare once per month  . Drug use: No     Family Hx: The patient's family history includes Blindness in her sister; Cancer in her brother and brother; Colon cancer in her brother; Congestive Heart Failure in her sister; Healthy in her daughter, daughter, daughter, and son; Heart attack in her father; Heart disease in her father; Hypertension in her father and sister; Thyroid disease in her sister. There is no history of Diabetes, Prostate cancer, or Breast cancer.  ROS:   Please see the history of present illness.     All other systems reviewed and are negative.   Prior CV studies:   The following studies were reviewed today:    Labs/Other Tests and Data Reviewed:    EKG:    Recent Labs: 11/17/2018: B Natriuretic Peptide 94.7 01/19/2019: ALT 17; TSH 6.63 02/25/2019: BUN 20; Creatinine, Ser 1.00; Hemoglobin  13.1; Platelets 239; Potassium 3.2; Sodium 140   Recent Lipid Panel Lab Results  Component Value Date/Time   CHOL 125 01/19/2019 12:00 PM   CHOL 101 10/07/2018 02:35 PM   TRIG 306.0 (H) 01/19/2019 12:00 PM   HDL 41.80 01/19/2019 12:00 PM   HDL 48 10/07/2018 02:35 PM   CHOLHDL 3 01/19/2019 12:00 PM   LDLCALC 24 10/07/2018 02:35 PM   LDLDIRECT 49.0 01/19/2019 12:00 PM    Wt Readings from Last 3 Encounters:  01/08/19 199 lb 12.8 oz (90.6 kg)  01/01/19 202 lb 12.8 oz (92 kg)  12/09/18 205 lb (93 kg)     Objective:    Vital Signs:  BP (!) 160/85   Pulse 75   Ht 5\' 5"  (1.651 m)   BMI 33.25 kg/m   Physcial exam not done as televisit   ASSESSMENT & PLAN:    1. CAD  No symptoms of angina  Continue meds 2. HL   LDL is well controlled   3   HTN   BP is running higher  At times  Not a true resting   Will follow   4  COVID-19 Education: The signs and symptoms of COVID-19 were discussed with the patient and how to seek care for testing (follow up with PCP or arrange E-visit).  The importance of social distancing was discussed today.  Time:   Today, I have spent 20  minutes with the patient with telehealth technology discussing the above problems.     Medication Adjustments/Labs and Tests Ordered: Current medicines are reviewed at length with the patient today.  Concerns regarding medicines are outlined above.   Tests Ordered: No orders of the defined types were placed in this encounter.   Medication Changes: No orders of the defined types were placed in this encounter.   Disposition:  Follow up in fall    Dorris Carnes, MD  04/13/2019 1:45 PM    [place]

## 2019-04-15 ENCOUNTER — Telehealth: Payer: Self-pay

## 2019-04-15 NOTE — Telephone Encounter (Signed)
Called patient to verify upcoming appointment.

## 2019-04-15 NOTE — Telephone Encounter (Signed)
Copied from [place] [id number]. Topic: Appointment Scheduling - Scheduling Inquiry for Clinic >> Apr 14, 2019  4:35 PM Yvette Rack wrote: Reason for CRM: Pt called to confirm her appt. Pt requests call back. Cb# [phone]

## 2019-04-21 ENCOUNTER — Ambulatory Visit (INDEPENDENT_AMBULATORY_CARE_PROVIDER_SITE_OTHER): Payer: Medicare Other | Admitting: Family Medicine

## 2019-04-21 ENCOUNTER — Other Ambulatory Visit: Payer: Self-pay

## 2019-04-21 VITALS — BP 155/75 | Wt 210.0 lb

## 2019-04-21 DIAGNOSIS — E039 Hypothyroidism, unspecified: Secondary | ICD-10-CM

## 2019-04-21 DIAGNOSIS — E782 Mixed hyperlipidemia: Secondary | ICD-10-CM

## 2019-04-21 DIAGNOSIS — E60 Dietary zinc deficiency: Secondary | ICD-10-CM | POA: Diagnosis not present

## 2019-04-21 DIAGNOSIS — R739 Hyperglycemia, unspecified: Secondary | ICD-10-CM | POA: Diagnosis not present

## 2019-04-21 DIAGNOSIS — I10 Essential (primary) hypertension: Secondary | ICD-10-CM

## 2019-04-22 ENCOUNTER — Other Ambulatory Visit: Payer: Medicare Other

## 2019-04-22 NOTE — Assessment & Plan Note (Signed)
On Levothyroxine, continue to monitor 

## 2019-04-22 NOTE — Assessment & Plan Note (Signed)
Encouraged heart healthy diet, increase exercise, avoid trans fat

## 2019-04-22 NOTE — Assessment & Plan Note (Signed)
Continue to supplement and monitor

## 2019-04-22 NOTE — Progress Notes (Signed)
Virtual Visit via Video Note  I connected with [name] on 04/22/19 at  2:00 PM EDT by a video enabled telemedicine application and verified that I am speaking with the correct person using two identifiers.   I discussed the limitations of evaluation and management by telemedicine and the availability of in person appointments. The patient expressed understanding and agreed to proceed. From the office Magdalene Molly, CMA tried to get patient set up on the video platform but was unsuccessful sot he visit was completed via telephone. Patient was at her home, provider at provider's home     Subjective:    Patient ID: [name], female    DOB: Feb 16, 1946, 73 y.o.   MRN: [id number]  No chief complaint on file.   HPI Patient is in today for follow up on chronic medical concerns including hypertension, hyperlipidemia, hypothyroidism and more. She if feeling well today without any recent febrile illness or hospitalizations. She does need some refills on some medications. She traveled to the [place] in November of 2019 and when she returned home she was very ill with covid type symptoms then so she is questioning if it is possible she had it then. Now she feels well without any respiratory symptoms. Denies CP/palp/SOB/HA/congestion/fevers/GI or GU c/o. Taking meds as prescribed  Past Medical History:  Diagnosis Date  . Acute MI, subendocardial ([place])   . Arthritis   . Back pain 05/31/2013  . Benign paroxysmal positional vertigo 08/05/2016  . CAD (coronary artery disease)    a. NSTEMI 10/2011: [place] 11/19/11: pLAD 30%, oOM 60%, AVCFX 30%, CFX after OM2 30%, pRCA 60 and 70%, then 99%, AM 80-90% with TIMI 3 flow.  PCI: Promus DES x 2 to RCA; b. 06/2012 Cath: patent RCA stents w/ subtl occl of Acute Marginal (jailed)->Med rx; c. 05/2015 MV: EF 59%, mod mid infsept/inf/ap lat/ap infarct with peri-infarct isch-->Med Rx; d. 02/2016 Cath: LM nl, LAD 49m, RI 50, RCA patent stents.  . Colitis   .  Facial skin lesion 01/28/2017  . GERD (gastroesophageal reflux disease)    occasional  . Hyperlipidemia   . Hypertension   . Hypertensive heart disease    a. Echocardiogram 11/19/11: Difficult acoustic windows, EF 60-65%, normal LV wall thickness, grade 1 diastolic dysfunction  . Hypoparathyroidism ([place])   . Low zinc level 11/26/2016  . Multinodular thyroid    Goiter s/p thyroidectomy in 2007 with post-op  hypocalcemia and post-op hypothyroidism/hypoparathyroidism with hypocalcemia  . Neck pain on right side 07/13/2013  . Nocturia 05/31/2013  . Pain in right axilla 03/13/2016  . Palpitations    a. 03/2012 - patient set up for event monitor but did not wear correctly -  she declined wearing a repeat monitor  . Post-surgical hypothyroidism   . Sun-damaged skin 12/05/2014  . Tubular adenoma of colon 06/2011  . Unspecified constipation 05/31/2013  . Vertigo   . Zinc deficiency 11/26/2015    Past Surgical History:  Procedure Laterality Date  . CARDIAC CATHETERIZATION N/A 03/08/2016   Procedure: Left Heart Cath and Coronary Angiography;  Surgeon: Jettie Booze, MD;  Location: [facility];  Service: Cardiovascular;  Laterality: N/A;  . COLONOSCOPY  Aenomatous polyps   07/05/2011  . COLONOSCOPY N/A 09/28/2014   Procedure: COLONOSCOPY;  Surgeon: Ladene Artist, MD;  Location: [facility];  Service: Endoscopy;  Laterality: N/A;  . CORONARY ANGIOPLASTY WITH STENT PLACEMENT    . LEFT HEART CATH AND CORONARY ANGIOGRAPHY N/A 12/09/2018   Procedure:  LEFT HEART CATH AND CORONARY ANGIOGRAPHY;  Surgeon: Jettie Booze, MD;  Location: [facility];  Service: Cardiovascular;  Laterality: N/A;  . LEFT HEART CATHETERIZATION WITH CORONARY ANGIOGRAM N/A 07/17/2012   Procedure: LEFT HEART CATHETERIZATION WITH CORONARY ANGIOGRAM;  Surgeon: Hillary Bow, MD;  Location: [facility] C[facility] LAB;  Service: Cardiovascular;  Laterality: N/A;  . PERCUTANEOUS CORONARY STENT INTERVENTION (PCI-S) N/A 11/19/2011    Procedure: PERCUTANEOUS CORONARY STENT INTERVENTION (PCI-S);  Surgeon: Hillary Bow, MD;  Location: [facility] CATH LAB;  Service: Cardiovascular;  Laterality: N/A;  . SHOULDER ARTHROSCOPY W/ ROTATOR CUFF REPAIR     right  . TOTAL THYROIDECTOMY  2007   GOITER    Family History  Problem Relation Age of Onset  . Colon cancer Brother   . Cancer Brother        COLON  . Hypertension Father   . Heart disease Father   . Heart attack Father   . Blindness Sister   . Congestive Heart Failure Sister   . Hypertension Sister   . Healthy Daughter   . Healthy Son   . Thyroid disease Sister   . Cancer Brother        multiple myelomas  . Healthy Daughter   . Healthy Daughter   . Diabetes Neg Hx   . Prostate cancer Neg Hx   . Breast cancer Neg Hx     Social History   Socioeconomic History  . Marital status: Married    Spouse name: Not on file  . Number of children: 4  . Years of education: 82  . Highest education level: Not on file  Occupational History  . Occupation: Press photographer person at [employer]  . Financial resource strain: Not on file  . Food insecurity:    Worry: Not on file    Inability: Not on file  . Transportation needs:    Medical: Not on file    Non-medical: Not on file  Tobacco Use  . Smoking status: Never Smoker  . Smokeless tobacco: Never Used  Substance and Sexual Activity  . Alcohol use: Yes    Comment: rare once per month  . Drug use: No  . Sexual activity: Not Currently  Lifestyle  . Physical activity:    Days per week: Not on file    Minutes per session: Not on file  . Stress: Not on file  Relationships  . Social connections:    Talks on phone: Not on file    Gets together: Not on file    Attends religious service: Not on file    Active member of club or organization: Not on file    Attends meetings of clubs or organizations: Not on file    Relationship status: Not on file  . Intimate partner violence:    Fear of current or ex partner: Not on  file    Emotionally abused: Not on file    Physically abused: Not on file    Forced sexual activity: Not on file  Other Topics Concern  . Not on file  Social History Narrative   Lives at home alone.  Her son lives near her.   Right-handed.   1 cup coffee per day.    Outpatient Medications Prior to Visit  Medication Sig Dispense Refill  . ALPRAZolam (XANAX) 0.25 MG tablet Take 1 tablet (0.25 mg total) by mouth 2 (two) times daily as needed for anxiety. 30 tablet 0  . amLODipine (NORVASC) 10 MG tablet  Take 1 tablet (10 mg total) by mouth daily. 90 tablet 3  . aspirin EC 81 MG tablet Take 81 mg by mouth daily.      . B Complex-C (B-COMPLEX WITH VITAMIN C) tablet Take 1 tablet by mouth daily.    . bisoprolol (ZEBETA) 5 MG tablet Take 1 tablet (5 mg total) by mouth 2 (two) times daily. 180 tablet 3  . calcitRIOL (ROCALTROL) 0.25 MCG capsule TAKE 3 CAPSULES(0.75 MCG) BY MOUTH DAILY (Patient taking differently: Take 0.75 mcg by mouth daily. ) 270 capsule 3  . cholecalciferol (VITAMIN D) 1000 UNITS tablet Take 1,000 Units by mouth daily.    . clopidogrel (PLAVIX) 75 MG tablet TAKE 1 TABLET(75 MG) BY MOUTH DAILY 90 tablet 2  . Evolocumab (REPATHA SURECLICK) 017 MG/ML SOAJ Inject 1 pen into the skin every 14 (fourteen) days. 2 pen 11  . furosemide (LASIX) 40 MG tablet Take 1 tablet (40 mg total) by mouth daily. 90 tablet 1  . gabapentin (NEURONTIN) 300 MG capsule Take 1 capsule (300 mg total) by mouth 3 (three) times daily as needed (nerve pain). 90 capsule 1  . isosorbide mononitrate (IMDUR) 60 MG 24 hr tablet Take 1 tablet (60 mg total) by mouth daily. 90 tablet 3  . ketoconazole (NIZORAL) 2 % cream Apply 1 fingertip amount to each foot daily. 30 g 0  . levothyroxine (SYNTHROID, LEVOTHROID) 100 MCG tablet Take 1 tablet by mouth daily 90 tablet 1  . Multiple Vitamin (MULTIVITAMIN WITH MINERALS) TABS tablet Take 1 tablet by mouth daily.    . nitroGLYCERIN (NITROSTAT) 0.4 MG SL tablet DISSOLVE 1  TABLET UNDER THE TONGUE EVERY 5 MINUTES AS NEEDED FOR CHEST PAIN AS DIRECTED (Patient taking differently: Place 0.4 mg under the tongue every 5 (five) minutes as needed for chest pain. ) 75 tablet 0  . pantoprazole (PROTONIX) 40 MG tablet Take 1 tablet (40 mg total) by mouth daily. 90 tablet 3  . rosuvastatin (CRESTOR) 40 MG tablet TAKE 1 TABLET(40 MG) BY MOUTH DAILY 90 tablet 2  . tiZANidine (ZANAFLEX) 2 MG tablet Take 1-2 tablets (2-4 mg total) by mouth every 8 (eight) hours as needed for muscle spasms. 40 tablet 1   No facility-administered medications prior to visit.     Allergies  Allergen Reactions  . Zetia [Ezetimibe] Other (See Comments)    Myalgia, paresthesias and weakness    Review of Systems  Constitutional: Positive for malaise/fatigue. Negative for fever.  HENT: Negative for congestion.   Eyes: Negative for blurred vision.  Respiratory: Negative for shortness of breath.   Cardiovascular: Negative for chest pain, palpitations and leg swelling.  Gastrointestinal: Negative for abdominal pain, blood in stool and nausea.  Genitourinary: Negative for dysuria and frequency.  Musculoskeletal: Negative for falls.  Skin: Negative for rash.  Neurological: Negative for dizziness, loss of consciousness and headaches.  Endo/Heme/Allergies: Negative for environmental allergies.  Psychiatric/Behavioral: Negative for depression. The patient is nervous/anxious.        Objective:    Physical Exam Unable to obtain due to telephone visit BP (!) 155/75 (BP Location: Left Arm, Patient Position: Sitting, Cuff Size: Normal)   Wt 210 lb (95.3 kg)   BMI 34.95 kg/m  Wt Readings from Last 3 Encounters:  04/21/19 210 lb (95.3 kg)  01/08/19 199 lb 12.8 oz (90.6 kg)  01/01/19 202 lb 12.8 oz (92 kg)    Diabetic Foot Exam - Simple   No data filed     Lab Results  Component  Value Date   WBC 8.3 02/25/2019   HGB 13.1 02/25/2019   HCT 40.1 02/25/2019   PLT 239 02/25/2019   GLUCOSE 127  (H) 02/25/2019   CHOL 125 01/19/2019   TRIG 306.0 (H) 01/19/2019   HDL 41.80 01/19/2019   LDLDIRECT 49.0 01/19/2019   LDLCALC 24 10/07/2018   ALT 17 01/19/2019   AST 16 01/19/2019   NA 140 02/25/2019   K 3.2 (L) 02/25/2019   CL 104 02/25/2019   CREATININE 1.00 02/25/2019   BUN 20 02/25/2019   CO2 29 02/25/2019   TSH 6.63 (H) 01/19/2019   INR 0.86 03/07/2016   HGBA1C 5.9 05/07/2016    Lab Results  Component Value Date   TSH 6.63 (H) 01/19/2019   Lab Results  Component Value Date   WBC 8.3 02/25/2019   HGB 13.1 02/25/2019   HCT 40.1 02/25/2019   MCV 88.7 02/25/2019   PLT 239 02/25/2019   Lab Results  Component Value Date   NA 140 02/25/2019   K 3.2 (L) 02/25/2019   CO2 29 02/25/2019   GLUCOSE 127 (H) 02/25/2019   BUN 20 02/25/2019   CREATININE 1.00 02/25/2019   BILITOT 0.4 01/19/2019   ALKPHOS 84 01/19/2019   AST 16 01/19/2019   ALT 17 01/19/2019   PROT 6.8 01/19/2019   ALBUMIN 4.0 01/19/2019   CALCIUM 7.9 (L) 02/25/2019   ANIONGAP 7 02/25/2019   GFR 56.46 (L) 01/19/2019   Lab Results  Component Value Date   CHOL 125 01/19/2019   Lab Results  Component Value Date   HDL 41.80 01/19/2019   Lab Results  Component Value Date   LDLCALC 24 10/07/2018   Lab Results  Component Value Date   TRIG 306.0 (H) 01/19/2019   Lab Results  Component Value Date   CHOLHDL 3 01/19/2019   Lab Results  Component Value Date   HGBA1C 5.9 05/07/2016       Assessment & Plan:   Problem List Items Addressed This Visit    Hypothyroidism    On Levothyroxine, continue to monitor      Relevant Orders   TSH   T4, free   Hyperlipidemia - Primary    Encouraged heart healthy diet, increase exercise, avoid trans fat      Relevant Orders   Lipid panel   Essential hypertension, benign    Not well controlled for now will have the patient move her Amlodipine to the evening and monitor her bp for one week if does not improve will consider adding HCTZ 12.5 mg daily .  Encouraged heart healthy diet such as the DASH diet and exercise as tolerated.       Relevant Orders   CBC   Comprehensive metabolic panel   Zinc deficiency    Continue to supplement and monitor      Relevant Orders   Zinc    Other Visit Diagnoses    Hyperglycemia       Relevant Orders   Hemoglobin A1c   CBC      I am having [name] maintain her aspirin EC, B-complex with vitamin C, cholecalciferol, multivitamin with minerals, ketoconazole, calcitRIOL, pantoprazole, nitroGLYCERIN, amLODipine, isosorbide mononitrate, furosemide, gabapentin, ALPRAZolam, tiZANidine, rosuvastatin, clopidogrel, Evolocumab, levothyroxine, and bisoprolol.  No orders of the defined types were placed in this encounter.   I discussed the assessment and treatment plan with the patient. The patient was provided an opportunity to ask questions and all were answered. The patient agreed with the plan and  demonstrated an understanding of the instructions.   The patient was advised to call back or seek an in-person evaluation if the symptoms worsen or if the condition fails to improve as anticipated.  I provided 25 minutes of non-face-to-face time during this encounter.   Penni Homans, MD

## 2019-04-22 NOTE — Assessment & Plan Note (Signed)
Not well controlled for now will have the patient move her Amlodipine to the evening and monitor her bp for one week if does not improve will consider adding HCTZ 12.5 mg daily . Encouraged heart healthy diet such as the DASH diet and exercise as tolerated.

## 2019-04-23 ENCOUNTER — Other Ambulatory Visit: Payer: Self-pay

## 2019-04-23 ENCOUNTER — Other Ambulatory Visit (INDEPENDENT_AMBULATORY_CARE_PROVIDER_SITE_OTHER): Payer: Medicare Other

## 2019-04-23 DIAGNOSIS — E039 Hypothyroidism, unspecified: Secondary | ICD-10-CM | POA: Diagnosis not present

## 2019-04-23 DIAGNOSIS — I10 Essential (primary) hypertension: Secondary | ICD-10-CM | POA: Diagnosis not present

## 2019-04-23 DIAGNOSIS — E60 Dietary zinc deficiency: Secondary | ICD-10-CM | POA: Diagnosis not present

## 2019-04-23 DIAGNOSIS — R739 Hyperglycemia, unspecified: Secondary | ICD-10-CM | POA: Diagnosis not present

## 2019-04-23 DIAGNOSIS — E782 Mixed hyperlipidemia: Secondary | ICD-10-CM

## 2019-04-23 LAB — CBC
HCT: 40.6 % (ref 36.0–46.0)
Hemoglobin: 13.7 g/dL (ref 12.0–15.0)
MCHC: 33.8 g/dL (ref 30.0–36.0)
MCV: 87.2 fl (ref 78.0–100.0)
Platelets: 256 10*3/uL (ref 150.0–400.0)
RBC: 4.65 Mil/uL (ref 3.87–5.11)
RDW: 14.7 % (ref 11.5–15.5)
WBC: 8.1 10*3/uL (ref 4.0–10.5)

## 2019-04-23 LAB — COMPREHENSIVE METABOLIC PANEL WITH GFR
ALT: 11 U/L (ref 0–35)
AST: 13 U/L (ref 0–37)
Albumin: 4.1 g/dL (ref 3.5–5.2)
Alkaline Phosphatase: 69 U/L (ref 39–117)
BUN: 23 mg/dL (ref 6–23)
CO2: 31 meq/L (ref 19–32)
Calcium: 8 mg/dL — ABNORMAL LOW (ref 8.4–10.5)
Chloride: 100 meq/L (ref 96–112)
Creatinine, Ser: 1.09 mg/dL (ref 0.40–1.20)
GFR: 49.32 mL/min — ABNORMAL LOW
Glucose, Bld: 103 mg/dL — ABNORMAL HIGH (ref 70–99)
Potassium: 4 meq/L (ref 3.5–5.1)
Sodium: 141 meq/L (ref 135–145)
Total Bilirubin: 0.4 mg/dL (ref 0.2–1.2)
Total Protein: 7.1 g/dL (ref 6.0–8.3)

## 2019-04-23 LAB — LIPID PANEL
Cholesterol: 143 mg/dL (ref 0–200)
HDL: 44.4 mg/dL
LDL Cholesterol: 62 mg/dL (ref 0–99)
NonHDL: 98.83
Total CHOL/HDL Ratio: 3
Triglycerides: 186 mg/dL — ABNORMAL HIGH (ref 0.0–149.0)
VLDL: 37.2 mg/dL (ref 0.0–40.0)

## 2019-04-23 LAB — T4, FREE: Free T4: 1.15 ng/dL (ref 0.60–1.60)

## 2019-04-23 LAB — HEMOGLOBIN A1C: Hgb A1c MFr Bld: 5.8 % (ref 4.6–6.5)

## 2019-04-23 LAB — TSH: TSH: 5.52 u[IU]/mL — ABNORMAL HIGH (ref 0.35–4.50)

## 2019-04-23 NOTE — Progress Notes (Signed)
Labs

## 2019-04-25 LAB — ZINC: Zinc: 73 ug/dL (ref 60–130)

## 2019-04-27 ENCOUNTER — Other Ambulatory Visit (INDEPENDENT_AMBULATORY_CARE_PROVIDER_SITE_OTHER): Payer: Medicare Other

## 2019-04-27 DIAGNOSIS — E559 Vitamin D deficiency, unspecified: Secondary | ICD-10-CM

## 2019-04-27 LAB — VITAMIN D 25 HYDROXY (VIT D DEFICIENCY, FRACTURES): VITD: 51.5 ng/mL (ref 30.00–100.00)

## 2019-05-29 ENCOUNTER — Telehealth: Payer: Self-pay

## 2019-05-29 ENCOUNTER — Other Ambulatory Visit: Payer: Self-pay | Admitting: Internal Medicine

## 2019-05-29 DIAGNOSIS — R079 Chest pain, unspecified: Secondary | ICD-10-CM

## 2019-05-29 NOTE — Telephone Encounter (Signed)
   [facility] Pre-operative Risk Assessment    Request for surgical clearance:  1. What type of surgery is being performed? SURGICAL EXTRACTION/ORAL SURGERY  2. When is this surgery scheduled? 06/09/2019  3. What type of clearance is required (medical clearance vs. Pharmacy clearance to hold med vs. Both)? BOTH  4. Are there any medications that need to be held prior to surgery and how long? COUMADIN  5. Practice name and name of physician performing surgery? [facility]/ DR Lassen  6. What is your office phone number [phone]   7.   What is your office fax number [phone]  8.   Anesthesia type (None, local, MAC, general) ?    [name] 05/29/2019, 3:31 PM  _________________________________________________________________   (provider comments below)

## 2019-05-30 ENCOUNTER — Other Ambulatory Visit: Payer: Self-pay | Admitting: Internal Medicine

## 2019-05-30 DIAGNOSIS — E892 Postprocedural hypoparathyroidism: Secondary | ICD-10-CM

## 2019-06-01 ENCOUNTER — Telehealth: Payer: Self-pay | Admitting: Internal Medicine

## 2019-06-01 NOTE — Telephone Encounter (Signed)
Encounter not needed

## 2019-06-02 ENCOUNTER — Telehealth: Payer: Self-pay | Admitting: Internal Medicine

## 2019-06-02 DIAGNOSIS — E039 Hypothyroidism, unspecified: Secondary | ICD-10-CM

## 2019-06-02 DIAGNOSIS — R079 Chest pain, unspecified: Secondary | ICD-10-CM

## 2019-06-02 DIAGNOSIS — I10 Essential (primary) hypertension: Secondary | ICD-10-CM

## 2019-06-02 DIAGNOSIS — E876 Hypokalemia: Secondary | ICD-10-CM

## 2019-06-02 MED ORDER — AMLODIPINE BESYLATE 10 MG PO TABS: 10.0000 mg | ORAL_TABLET | Freq: Every day | ORAL | 3 refills | Status: DC

## 2019-06-02 MED ORDER — NITROGLYCERIN 0.4 MG SL SUBL: 0.4000 mg | SUBLINGUAL_TABLET | SUBLINGUAL | 3 refills | Status: DC | PRN

## 2019-06-02 NOTE — Telephone Encounter (Signed)
     [facility] Pre-operative Risk Assessment    Request for surgical clearance:  1. What type of surgery is being performed? Tooth extraction  2. When is this surgery scheduled? 06/08/19  3. What type of clearance is required (medical clearance vs. Pharmacy clearance to hold med vs. Both)? both  4. Are there any medications that need to be held prior to surgery and how long? Blood thinner, can she stop and if so, when to go back on it  5. Practice name and name of physician performing surgery? Dr Lanna Poche @ [facility]  6. What is your office phone number [phone]   7.   What is your office fax number [phone]  8.   Anesthesia type (None, local, MAC, general) ? local   [name] 06/02/2019, 12:24 PM  _________________________________________________________________   (provider comments below)

## 2019-06-02 NOTE — Telephone Encounter (Signed)
   Primary Cardiologist: Dorris Carnes, MD  Chart reviewed as part of pre-operative protocol coverage. Simple dental extractions are considered low risk procedures per guidelines and generally do not require any specific cardiac clearance. It is also generally accepted that for simple extractions and dental cleanings, there is no need to interrupt blood thinner therapy.   SBE prophylaxis is not required for the patient.  If the patient requires more extensive extractions of more than 2 teeth or more involved oral surgery, please provide more detailed information for the request.   I will route this recommendation to the requesting party via Epic fax function and remove from pre-op pool.  Please call with questions.  Daune Perch, NP 06/02/2019, 2:15 PM

## 2019-06-02 NOTE — Telephone Encounter (Signed)
New Message:   Patient would like to speak with some one concering some medication. Please call patient.

## 2019-06-02 NOTE — Telephone Encounter (Signed)
Pt calling today to ask if Dr Harrington Challenger can write or refill a prescription for her husband for nitro tabs. Her sister, living in [place], needs them and cannot get them where she lives. I explained to pt we cannot write a prescription for one person and administer to another. It could be considered fraud.   I advised her to ask her sister's MD in [place] to call in the order to a local pharmacy, and she can pick it up for her sister there. She verbalized understanding and had no questions.  I refilled pt's Amlodipine and Nitro tabs per pt's request.

## 2019-06-04 ENCOUNTER — Ambulatory Visit: Payer: Medicare Other | Admitting: Podiatry

## 2019-06-12 ENCOUNTER — Other Ambulatory Visit: Payer: Self-pay

## 2019-06-12 ENCOUNTER — Ambulatory Visit (INDEPENDENT_AMBULATORY_CARE_PROVIDER_SITE_OTHER): Payer: Medicare Other | Admitting: Podiatry

## 2019-06-12 VITALS — Temp 98.2°F

## 2019-06-12 DIAGNOSIS — E1151 Type 2 diabetes mellitus with diabetic peripheral angiopathy without gangrene: Secondary | ICD-10-CM | POA: Diagnosis not present

## 2019-06-12 DIAGNOSIS — B351 Tinea unguium: Secondary | ICD-10-CM | POA: Diagnosis not present

## 2019-06-12 DIAGNOSIS — Z79899 Other long term (current) drug therapy: Secondary | ICD-10-CM

## 2019-06-12 DIAGNOSIS — L84 Corns and callosities: Secondary | ICD-10-CM

## 2019-06-12 MED ORDER — FLUCONAZOLE 150 MG PO TABS: 150.0000 mg | ORAL_TABLET | ORAL | 1 refills | Status: DC

## 2019-06-14 NOTE — Progress Notes (Signed)
Subjective:  Patient ID: [name], female    DOB: 10-17-1946,  MRN: [id number]  Chief Complaint  Patient presents with  . Nail Problem    Nail trim 1-5 bilateral.   . Nail Problem    Bilateral   . Callouses    Callouses on 1st digit medial aspect    73 y.o. female presents  for diabetic foot care. On plavix.  Would like to discuss continuing that medication she was taking for the fungus toenails but is concerned about side effects Denies numbness and tingling in their feet. Denies cramping in legs and thighs.  Review of Systems: Negative except as noted in the HPI. Denies N/V/F/Ch.  Past Medical History:  Diagnosis Date  . Acute MI, subendocardial ([place])   . Arthritis   . Back pain 05/31/2013  . Benign paroxysmal positional vertigo 08/05/2016  . CAD (coronary artery disease)    a. NSTEMI 10/2011: [place] 11/19/11: pLAD 30%, oOM 60%, AVCFX 30%, CFX after OM2 30%, pRCA 60 and 70%, then 99%, AM 80-90% with TIMI 3 flow.  PCI: Promus DES x 2 to RCA; b. 06/2012 Cath: patent RCA stents w/ subtl occl of Acute Marginal (jailed)->Med rx; c. 05/2015 MV: EF 59%, mod mid infsept/inf/ap lat/ap infarct with peri-infarct isch-->Med Rx; d. 02/2016 Cath: LM nl, LAD 47m, RI 50, RCA patent stents.  . Colitis   . Facial skin lesion 01/28/2017  . GERD (gastroesophageal reflux disease)    occasional  . Hyperlipidemia   . Hypertension   . Hypertensive heart disease    a. Echocardiogram 11/19/11: Difficult acoustic windows, EF 60-65%, normal LV wall thickness, grade 1 diastolic dysfunction  . Hypoparathyroidism ([place])   . Low zinc level 11/26/2016  . Multinodular thyroid    Goiter s/p thyroidectomy in 2007 with post-op  hypocalcemia and post-op hypothyroidism/hypoparathyroidism with hypocalcemia  . Neck pain on right side 07/13/2013  . Nocturia 05/31/2013  . Pain in right axilla 03/13/2016  . Palpitations    a. 03/2012 - patient set up for event monitor but did not wear correctly -  she declined wearing a  repeat monitor  . Post-surgical hypothyroidism   . Sun-damaged skin 12/05/2014  . Tubular adenoma of colon 06/2011  . Unspecified constipation 05/31/2013  . Vertigo   . Zinc deficiency 11/26/2015    Current Outpatient Medications:  .  ALPRAZolam (XANAX) 0.25 MG tablet, Take 1 tablet (0.25 mg total) by mouth 2 (two) times daily as needed for anxiety., Disp: 30 tablet, Rfl: 0 .  amLODipine (NORVASC) 10 MG tablet, Take 1 tablet (10 mg total) by mouth daily., Disp: 90 tablet, Rfl: 3 .  aspirin EC 81 MG tablet, Take 81 mg by mouth daily.  , Disp: , Rfl:  .  B Complex-C (B-COMPLEX WITH VITAMIN C) tablet, Take 1 tablet by mouth daily., Disp: , Rfl:  .  bisoprolol (ZEBETA) 5 MG tablet, Take 1 tablet (5 mg total) by mouth 2 (two) times daily., Disp: 180 tablet, Rfl: 3 .  calcitRIOL (ROCALTROL) 0.25 MCG capsule, TAKE 3 CAPSULES(0.75 MCG) BY MOUTH DAILY, Disp: 270 capsule, Rfl: 2 .  cholecalciferol (VITAMIN D) 1000 UNITS tablet, Take 1,000 Units by mouth daily., Disp: , Rfl:  .  clopidogrel (PLAVIX) 75 MG tablet, TAKE 1 TABLET(75 MG) BY MOUTH DAILY, Disp: 90 tablet, Rfl: 2 .  Evolocumab (REPATHA SURECLICK) 253 MG/ML SOAJ, Inject 1 pen into the skin every 14 (fourteen) days., Disp: 2 pen, Rfl: 11 .  furosemide (LASIX) 40 MG tablet, Take 1  tablet (40 mg total) by mouth daily., Disp: 90 tablet, Rfl: 1 .  gabapentin (NEURONTIN) 300 MG capsule, Take 1 capsule (300 mg total) by mouth 3 (three) times daily as needed (nerve pain)., Disp: 90 capsule, Rfl: 1 .  isosorbide mononitrate (IMDUR) 60 MG 24 hr tablet, Take 1 tablet (60 mg total) by mouth daily., Disp: 90 tablet, Rfl: 3 .  ketoconazole (NIZORAL) 2 % cream, Apply 1 fingertip amount to each foot daily., Disp: 30 g, Rfl: 0 .  levothyroxine (SYNTHROID, LEVOTHROID) 100 MCG tablet, Take 1 tablet by mouth daily, Disp: 90 tablet, Rfl: 1 .  Multiple Vitamin (MULTIVITAMIN WITH MINERALS) TABS tablet, Take 1 tablet by mouth daily., Disp: , Rfl:  .  nitroGLYCERIN  (NITROSTAT) 0.4 MG SL tablet, Place 1 tablet (0.4 mg total) under the tongue every 5 (five) minutes as needed for chest pain., Disp: 60 tablet, Rfl: 3 .  pantoprazole (PROTONIX) 40 MG tablet, Take 1 tablet (40 mg total) by mouth daily., Disp: 90 tablet, Rfl: 3 .  rosuvastatin (CRESTOR) 40 MG tablet, TAKE 1 TABLET(40 MG) BY MOUTH DAILY, Disp: 90 tablet, Rfl: 2 .  tiZANidine (ZANAFLEX) 2 MG tablet, Take 1-2 tablets (2-4 mg total) by mouth every 8 (eight) hours as needed for muscle spasms., Disp: 40 tablet, Rfl: 1 .  fluconazole (DIFLUCAN) 150 MG tablet, Take 1 tablet (150 mg total) by mouth once a week., Disp: 6 tablet, Rfl: 1  Social History   Tobacco Use  Smoking Status Never Smoker  Smokeless Tobacco Never Used    Allergies  Allergen Reactions  . Zetia [Ezetimibe] Other (See Comments)    Myalgia, paresthesias and weakness   Objective:   Vitals:   06/12/19 1048  Temp: 98.2 F (36.8 C)   There is no height or weight on file to calculate BMI. Constitutional Well developed. Well nourished.  Vascular Dorsalis pedis pulses present 1+ bilaterally  Posterior tibial pulses barely palpable bilaterally  Pedal hair growth diminished. Capillary refill normal to all digits.  No cyanosis or clubbing noted.  Neurologic Normal speech. Oriented to person, place, and time. Epicritic sensation to light touch grossly present bilaterally. Protective sensation with 5.07 monofilament  present bilaterally.  Dermatologic Nails elongated, thickened, dystrophic. Xerosis with scaling plantar foot bilat. No open wounds. HPK R medial hallux IPJ.  Orthopedic: Normal joint ROM without pain or crepitus bilaterally. No visible deformities. No bony tenderness.   Assessment:   1. Diabetes mellitus type 2 with peripheral artery disease (HCC)   2. Callus   3. Onychomycosis   4. Encounter for long-term (current) use of high-risk medication    Plan:  Patient was evaluated and treated and all questions  answered.  Diabetes with coagulation defect, Onychomycosis -Routine foot care as below  Procedure: Nail Debridement Rationale: Patient meets criteria for routine foot care due to PAD Type of Debridement: manual, sharp debridement. Instrumentation: Nail nipper, rotary burr. Number of Nails: 10  Procedure: Paring of Lesion Rationale: painful hyperkeratotic lesion Type of Debridement: manual, sharp debridement. Instrumentation: 312 blade Number of Lesions: 1   Tinea Pedis/Onychomycosis -Rx Fluconazole weekly for another 6 weeks.  Discussed that since we are only taking the medicine weekly we do not need to do routine lab work and there is no monitoring required    Return in about 3 months (around 09/12/2019) for Diabetic Foot Care.

## 2019-06-18 NOTE — Progress Notes (Signed)
Office Visit Note  Patient: [name]             Date of Birth: 1946-08-21           MRN: [id number]             PCP: Mosie Lukes, MD Referring: Mosie Lukes, MD Visit Date: 07/02/2019 Occupation: @GUAROCC @  Subjective:  Generalized pain   History of Present Illness: DIARA CHAUDHARI is a 73 y.o. female with history of positive ANA and osteoarthritis.  She reports over the past 2 months she has been having worsening arthralgias and myalgias.  She states her has worsening pain in both hands and both feet.  She denies any joint swelling.  She has been having increased right knee joint pain.  She has been having worsening muscle aches and muscle tenderness, especially in lower extremities.  She has trapezius muscle tension and tenderness.  She has been having worsening lower back pain.  She has seen Dr. Nelva Bush in the past, and she has an injections in the past. She also tried physical therapy.  She has radiating pain and numbness in both LE.  She denies any recent rashes, sores in mouth and nose,  Raynaud's, palpitations, shortness of breath, or swollen lymph nodes.   Activities of Daily Living:  Patient reports morning stiffness for 20 minutes.   Patient Reports nocturnal pain.  Difficulty dressing/grooming: Denies Difficulty climbing stairs: Reports Difficulty getting out of chair: Reports Difficulty using hands for taps, buttons, cutlery, and/or writing: Denies  Review of Systems  Constitutional: Positive for fatigue.  HENT: Negative for mouth sores, mouth dryness and nose dryness.   Eyes: Positive for dryness. Negative for pain and visual disturbance.  Respiratory: Negative for cough, hemoptysis, shortness of breath and difficulty breathing.   Cardiovascular: Negative for chest pain, palpitations, hypertension and swelling in legs/feet.  Gastrointestinal: Negative for blood in stool, constipation and diarrhea.  Endocrine: Negative for increased urination.    Genitourinary: Negative for painful urination.  Musculoskeletal: Positive for arthralgias, joint pain, myalgias, morning stiffness, muscle tenderness and myalgias. Negative for joint swelling and muscle weakness.  Skin: Negative for color change, pallor, rash, hair loss, nodules/bumps, skin tightness, ulcers and sensitivity to sunlight.  Allergic/Immunologic: Negative for susceptible to infections.  Neurological: Negative for dizziness, numbness, headaches and weakness.  Hematological: Negative for swollen glands.  Psychiatric/Behavioral: Positive for depressed mood and sleep disturbance. The patient is not nervous/anxious.     PMFS History:  Patient Active Problem List   Diagnosis Date Noted   Heart disease 01/29/2019   Sensorineural hearing loss (SNHL) of both ears 01/27/2019   Pulmonary congestion 01/19/2019   Primary osteoarthritis of both knees 12/19/2018   DDD (degenerative disc disease), lumbar 12/19/2018   ANA positive 10/27/2018   CAP (community acquired pneumonia) 03/06/2018   Lower extremity pain, lateral, right 02/17/2018   Post-menopausal bleeding 02/17/2018   Cephalalgia 02/17/2018   Anxiety 02/17/2018   Peripheral arterial disease ([place]) 02/20/2017   Facial skin lesion 01/28/2017   Benign paroxysmal positional vertigo 08/05/2016   Atypical chest pain 03/27/2016   Esophageal reflux 03/27/2016   Antiplatelet or antithrombotic long-term use 03/27/2016   Pain in right axilla 03/13/2016   Chest pain 03/07/2016   Right lumbar radiculopathy 02/21/2016   Abnormality of gait 02/21/2016   Zinc deficiency 11/26/2015   Chronic low back pain 10/26/2015   Right hip pain 10/26/2015   Medicare annual wellness visit, subsequent 03/13/2015   Preventative health care 03/13/2015  Sun-damaged skin 12/05/2014   Angina of effort ([place]) 12/02/2014   Hx of colonic polyps 09/28/2014   Benign neoplasm of descending colon 09/28/2014   Other fatigue  09/10/2014   Personal history of colonic polyps 07/16/2014   Bilateral hand pain 03/20/2014   Postsurgical hypoparathyroidism ([place]) 08/03/2013   Neck pain on right side 07/13/2013   Constipation 05/31/2013   Back pain 05/31/2013   Nocturia 05/31/2013   GERD (gastroesophageal reflux disease) 11/02/2012   Tension headache 11/02/2012   Anemia 06/11/2012   Hypokalemia 06/11/2012   Depression 01/21/2012   Headache(784.0) 12/23/2011   Bradycardia 12/07/2011   Hypocalcemia 12/07/2011   Acute MI, subendocardial ([place]) 11/20/2011   Hypothyroidism 05/20/2009   Essential hypertension, benign 05/20/2009   Hyperlipidemia 03/08/2009   CAD, NATIVE VESSEL 03/08/2009    Past Medical History:  Diagnosis Date   Acute MI, subendocardial ([place])    Arthritis    Back pain 05/31/2013   Benign paroxysmal positional vertigo 08/05/2016   CAD (coronary artery disease)    a. NSTEMI 10/2011: [place] 11/19/11: pLAD 30%, oOM 60%, AVCFX 30%, CFX after OM2 30%, pRCA 60 and 70%, then 99%, AM 80-90% with TIMI 3 flow.  PCI: Promus DES x 2 to RCA; b. 06/2012 Cath: patent RCA stents w/ subtl occl of Acute Marginal (jailed)->Med rx; c. 05/2015 MV: EF 59%, mod mid infsept/inf/ap lat/ap infarct with peri-infarct isch-->Med Rx; d. 02/2016 Cath: LM nl, LAD 22m, RI 50, RCA patent stents.   Colitis    Facial skin lesion 01/28/2017   GERD (gastroesophageal reflux disease)    occasional   Hyperlipidemia    Hypertension    Hypertensive heart disease    a. Echocardiogram 11/19/11: Difficult acoustic windows, EF 60-65%, normal LV wall thickness, grade 1 diastolic dysfunction   Hypoparathyroidism (H[facility])    Low zinc level 11/26/2016   Multinodular thyroid    Goiter s/p thyroidectomy in 2007 with post-op  hypocalcemia and post-op hypothyroidism/hypoparathyroidism with hypocalcemia   Neck pain on right side 07/13/2013   Nocturia 05/31/2013   Pain in right axilla 03/13/2016   Palpitations    a. 03/2012 -  patient set up for event monitor but did not wear correctly -  she declined wearing a repeat monitor   Post-surgical hypothyroidism    Sun-damaged skin 12/05/2014   Tubular adenoma of colon 06/2011   Unspecified constipation 05/31/2013   Vertigo    Zinc deficiency 11/26/2015    Family History  Problem Relation Age of Onset   Colon cancer Brother    Cancer Brother        COLON   Hypertension Father    Heart disease Father    Heart attack Father    Blindness Sister    Congestive Heart Failure Sister    Hypertension Sister    Healthy Daughter    Healthy Son    Thyroid disease Sister    Cancer Brother        multiple myelomas   Healthy Daughter    Healthy Daughter    Diabetes Neg Hx    Prostate cancer Neg Hx    Breast cancer Neg Hx    Past Surgical History:  Procedure Laterality Date   CARDIAC CATHETERIZATION N/A 03/08/2016   Procedure: Left Heart Cath and Coronary Angiography;  Surgeon: Jettie Booze, MD;  Location: [facility];  Service: Cardiovascular;  Laterality: N/A;   COLONOSCOPY  Aenomatous polyps   07/05/2011   COLONOSCOPY N/A 09/28/2014   Procedure: COLONOSCOPY;  Surgeon: Norberto Sorenson T  Fuller Plan, MD;  Location: [facility];  Service: Endoscopy;  Laterality: N/A;   CORONARY ANGIOPLASTY WITH STENT PLACEMENT     LEFT HEART CATH AND CORONARY ANGIOGRAPHY N/A 12/09/2018   Procedure: LEFT HEART CATH AND CORONARY ANGIOGRAPHY;  Surgeon: Jettie Booze, MD;  Location: [facility];  Service: Cardiovascular;  Laterality: N/A;   LEFT HEART CATHETERIZATION WITH CORONARY ANGIOGRAM N/A 07/17/2012   Procedure: LEFT HEART CATHETERIZATION WITH CORONARY ANGIOGRAM;  Surgeon: Hillary Bow, MD;  Location: [facility] LAB;  Service: Cardiovascular;  Laterality: N/A;   PERCUTANEOUS CORONARY STENT INTERVENTION (PCI-S) N/A 11/19/2011   Procedure: PERCUTANEOUS CORONARY STENT INTERVENTION (PCI-S);  Surgeon: Hillary Bow, MD;  Location: [facility];   Service: Cardiovascular;  Laterality: N/A;   SHOULDER ARTHROSCOPY W/ ROTATOR CUFF REPAIR     right   TOTAL THYROIDECTOMY  2007   GOITER   Social History   Social History Narrative   Lives at home alone.  Her son lives near her.   Right-handed.   1 cup coffee per day.   Immunization History  Administered Date(s) Administered   Influenza,inj,Quad PF,6+ Mos 09/14/2013   Pneumococcal Conjugate-13 09/12/2015   Pneumococcal Polysaccharide-23 06/05/2012   Tdap 12/27/2003, 03/15/2014     Objective: Vital Signs: BP 113/69 (BP Location: Left Arm, Patient Position: Sitting, Cuff Size: Normal)    Pulse (!) 51    Resp 14    Ht 5\' 4"  (1.626 m)    Wt 202 lb (91.6 kg)    BMI 34.67 kg/m    Physical Exam Vitals signs and nursing note reviewed.  Constitutional:      Appearance: She is well-developed.  HENT:     Head: Normocephalic and atraumatic.  Eyes:     Conjunctiva/sclera: Conjunctivae normal.  Neck:     Musculoskeletal: Normal range of motion.  Cardiovascular:     Rate and Rhythm: Normal rate and regular rhythm.     Heart sounds: Normal heart sounds.  Pulmonary:     Effort: Pulmonary effort is normal.     Breath sounds: Normal breath sounds.  Abdominal:     General: Bowel sounds are normal.     Palpations: Abdomen is soft.  Lymphadenopathy:     Cervical: No cervical adenopathy.  Skin:    General: Skin is warm and dry.     Capillary Refill: Capillary refill takes less than 2 seconds.  Neurological:     Mental Status: She is alert and oriented to person, place, and time.  Psychiatric:        Behavior: Behavior normal.      Musculoskeletal Exam: Generalized hyperalgesia. C-spine good ROM.  Limited ROM of thoracic and lumbar spine.  Midline spinal tenderness in lumbar region.  Shoulder joints, elbow joints, wrist joints, MCPs, PIPs, and DIPs good ROM with no synovitis. PIP and DIP synovial thickening consistent with osteoarthritis of both hands. Hip joints, knee joints,  ankle joints, MTPs, PIPs, and DIPs good ROM with no synovitis.  No warmth or effusion of knee joints.  No tenderness or swelling of ankle joints.    CDAI Exam: CDAI Score: -- Patient Global: --; Provider Global: -- Swollen: --; Tender: -- Joint Exam   No joint exam has been documented for this visit   There is currently no information documented on the homunculus. Go to the Rheumatology activity and complete the homunculus joint exam.  Investigation: No additional findings.  Imaging: No results found.  Recent Labs: Lab Results  Component Value Date  WBC 8.1 04/23/2019   HGB 13.7 04/23/2019   PLT 256.0 04/23/2019   NA 141 04/23/2019   K 4.0 04/23/2019   CL 100 04/23/2019   CO2 31 04/23/2019   GLUCOSE 103 (H) 04/23/2019   BUN 23 04/23/2019   CREATININE 1.09 04/23/2019   BILITOT 0.4 04/23/2019   ALKPHOS 69 04/23/2019   AST 13 04/23/2019   ALT 11 04/23/2019   PROT 7.1 04/23/2019   ALBUMIN 4.1 04/23/2019   CALCIUM 8.0 (L) 04/23/2019   GFRAA NOT CALCULATED 02/25/2019    Speciality Comments: No specialty comments available.  Procedures:  No procedures performed Allergies: Zetia [ezetimibe]   Assessment / Plan:     Visit Diagnoses: Positive ANA (antinuclear antibody) - History of inflammatory arthritis, ANA 1: 80 speckled, positive RNP, low titer anticardiolipin and beta-2 GP 1: She has no clinical features of autoimmune disease at this time.  She is not on any immunosuppressive agents at this time.  We will check autoimmune lab work today.  She has been having worsening myalgias and arthralgias but no synovitis was noted on exam today.  She was advised to notify us if develops any new or worsening symptoms.- Plan: Urinalysis, Routine w reflex microscopic, ANA, Anti-DNA antibody, double-stranded, C3 and C4, Sedimentation rate, RNP Antibody, Cardiolipin antibodies, IgG, IgM, IgA, Beta-2 glycoprotein antibodies   Primary osteoarthritis of both knees -  Bilateral moderate with  moderate chondromalacia patella -She has been having increased pain in both knee joints.  No warmth or effusion of the knee joints were noted.  She has good ROM.  She was encouraged to use aspercreme topically as needed for pain relief.   DDD (degenerative disc disease), lumbar - She has been having worsening lower back pain.  She has been seeing Dr. Nelva Bush on a regular basis.  She had an epidural injections in the past.  She was advised to follow up with Dr. Nelva Bush.    Other medical conditions are listed as follows:   Anxiety   History of MI (myocardial infarction)   History of gastroesophageal reflux (GERD)   Peripheral arterial disease (HCC)   Postoperative hypothyroidism   History of coronary artery disease  Essential hypertension, benign   History of hyperlipidemia   Orders: Orders Placed This Encounter  Procedures   Urinalysis, Routine w reflex microscopic   ANA   Anti-DNA antibody, double-stranded   C3 and C4   Sedimentation rate   RNP Antibody   Cardiolipin antibodies, IgG, IgM, IgA   Beta-2 glycoprotein antibodies   No orders of the defined types were placed in this encounter.     Follow-Up Instructions: Return in about 6 months (around 01/02/2020) for Positive ANA, Osteoarthritis.   Ofilia Neas, PA-C   I examined and evaluated the patient with Hazel Sams PA.  Patient continues to have some discomfort due to underlying osteoarthritis.  I do not see any synovitis on examination.  She has no clinical features of autoimmune disease on examination today.  Although she has had positive antibodies in the past.  Would repeat some of the antibody titers today.  The plan of care was discussed as noted above.  Bo Merino, MD  Note - This record has been created using Editor, commissioning.  Chart creation errors have been sought, but may not always  have been located. Such creation errors do not reflect on  the standard of medical care.

## 2019-06-22 ENCOUNTER — Other Ambulatory Visit: Payer: Self-pay | Admitting: Family Medicine

## 2019-07-02 ENCOUNTER — Encounter: Payer: Self-pay | Admitting: Rheumatology

## 2019-07-02 ENCOUNTER — Ambulatory Visit (INDEPENDENT_AMBULATORY_CARE_PROVIDER_SITE_OTHER): Payer: Medicare Other | Admitting: Rheumatology

## 2019-07-02 ENCOUNTER — Other Ambulatory Visit: Payer: Self-pay

## 2019-07-02 VITALS — BP 113/69 | HR 51 | Resp 14 | Ht 64.0 in | Wt 202.0 lb

## 2019-07-02 DIAGNOSIS — R768 Other specified abnormal immunological findings in serum: Secondary | ICD-10-CM | POA: Diagnosis not present

## 2019-07-02 DIAGNOSIS — I739 Peripheral vascular disease, unspecified: Secondary | ICD-10-CM

## 2019-07-02 DIAGNOSIS — I10 Essential (primary) hypertension: Secondary | ICD-10-CM

## 2019-07-02 DIAGNOSIS — M170 Bilateral primary osteoarthritis of knee: Secondary | ICD-10-CM | POA: Diagnosis not present

## 2019-07-02 DIAGNOSIS — M5136 Other intervertebral disc degeneration, lumbar region: Secondary | ICD-10-CM

## 2019-07-02 DIAGNOSIS — E890 Postprocedural hypothyroidism: Secondary | ICD-10-CM

## 2019-07-02 DIAGNOSIS — I252 Old myocardial infarction: Secondary | ICD-10-CM

## 2019-07-02 DIAGNOSIS — Z8679 Personal history of other diseases of the circulatory system: Secondary | ICD-10-CM

## 2019-07-02 DIAGNOSIS — F419 Anxiety disorder, unspecified: Secondary | ICD-10-CM

## 2019-07-02 DIAGNOSIS — Z8719 Personal history of other diseases of the digestive system: Secondary | ICD-10-CM

## 2019-07-02 DIAGNOSIS — Z8639 Personal history of other endocrine, nutritional and metabolic disease: Secondary | ICD-10-CM

## 2019-07-02 NOTE — Patient Instructions (Signed)
Back Exercises These exercises help to make your trunk and back strong. They also help to keep the lower back flexible. Doing these exercises can help to prevent back pain or lessen existing pain.  If you have back pain, try to do these exercises 2-3 times each day or as told by your doctor.  As you get better, do the exercises once each day. Repeat the exercises more often as told by your doctor.  To stop back pain from coming back, do the exercises once each day, or as told by your doctor. Exercises Single knee to chest Do these steps 3-5 times in a row for each leg: 1. Lie on your back on a firm bed or the floor with your legs stretched out. 2. Bring one knee to your chest. 3. Grab your knee or thigh with both hands and hold them it in place. 4. Pull on your knee until you feel a gentle stretch in your lower back or buttocks. 5. Keep doing the stretch for 10-30 seconds. 6. Slowly let go of your leg and straighten it. Pelvic tilt Do these steps 5-10 times in a row: 1. Lie on your back on a firm bed or the floor with your legs stretched out. 2. Bend your knees so they point up to the ceiling. Your feet should be flat on the floor. 3. Tighten your lower belly (abdomen) muscles to press your lower back against the floor. This will make your tailbone point up to the ceiling instead of pointing down to your feet or the floor. 4. Stay in this position for 5-10 seconds while you gently tighten your muscles and breathe evenly. Cat-cow Do these steps until your lower back bends more easily: 1. Get on your hands and knees on a firm surface. Keep your hands under your shoulders, and keep your knees under your hips. You may put padding under your knees. 2. Let your head hang down toward your chest. Tighten (contract) the muscles in your belly. Point your tailbone toward the floor so your lower back becomes rounded like the back of a cat. 3. Stay in this position for 5 seconds. 4. Slowly lift your  head. Let the muscles of your belly relax. Point your tailbone up toward the ceiling so your back forms a sagging arch like the back of a cow. 5. Stay in this position for 5 seconds.  Press-ups Do these steps 5-10 times in a row: 1. Lie on your belly (face-down) on the floor. 2. Place your hands near your head, about shoulder-width apart. 3. While you keep your back relaxed and keep your hips on the floor, slowly straighten your arms to raise the top half of your body and lift your shoulders. Do not use your back muscles. You may change where you place your hands in order to make yourself more comfortable. 4. Stay in this position for 5 seconds. 5. Slowly return to lying flat on the floor.  Bridges Do these steps 10 times in a row: 1. Lie on your back on a firm surface. 2. Bend your knees so they point up to the ceiling. Your feet should be flat on the floor. Your arms should be flat at your sides, next to your body. 3. Tighten your butt muscles and lift your butt off the floor until your waist is almost as high as your knees. If you do not feel the muscles working in your butt and the back of your thighs, slide your feet 1-2 inches   farther away from your butt. 4. Stay in this position for 3-5 seconds. 5. Slowly lower your butt to the floor, and let your butt muscles relax. If this exercise is too easy, try doing it with your arms crossed over your chest. Belly crunches Do these steps 5-10 times in a row: 1. Lie on your back on a firm bed or the floor with your legs stretched out. 2. Bend your knees so they point up to the ceiling. Your feet should be flat on the floor. 3. Cross your arms over your chest. 4. Tip your chin a little bit toward your chest but do not bend your neck. 5. Tighten your belly muscles and slowly raise your chest just enough to lift your shoulder blades a tiny bit off of the floor. Avoid raising your body higher than that, because it can put too much stress on your low  back. 6. Slowly lower your chest and your head to the floor. Back lifts Do these steps 5-10 times in a row: 1. Lie on your belly (face-down) with your arms at your sides, and rest your forehead on the floor. 2. Tighten the muscles in your legs and your butt. 3. Slowly lift your chest off of the floor while you keep your hips on the floor. Keep the back of your head in line with the curve in your back. Look at the floor while you do this. 4. Stay in this position for 3-5 seconds. 5. Slowly lower your chest and your face to the floor. Contact a doctor if:  Your back pain gets a lot worse when you do an exercise.  Your back pain does not get better 2 hours after you exercise. If you have any of these problems, stop doing the exercises. Do not do them again unless your doctor says it is okay. Get help right away if:  You have sudden, very bad back pain. If this happens, stop doing the exercises. Do not do them again unless your doctor says it is okay. This information is not intended to replace advice given to you by your health care provider. Make sure you discuss any questions you have with your health care provider. Document Released: 01/12/2011 Document Revised: 09/04/2018 Document Reviewed: 09/04/2018 Elsevier Patient Education  2020 [place] [employer], 15(4), [phone]. Retrieved September 29, 2018 from [email or web address].com/nursing">  Knee Exercises Ask your health care provider which exercises are safe for you. Do exercises exactly as told by your health care provider and adjust them as directed. It is normal to feel mild stretching, pulling, tightness, or discomfort as you do these exercises. Stop right away if you feel sudden pain or your pain gets worse. Do not begin these exercises until told by your health care provider. Stretching and range-of-motion exercises These exercises warm up your muscles and joints and improve the movement and flexibility of your  knee. These exercises also help to relieve pain and swelling. Knee extension, prone 1. Lie on your abdomen (prone position) on a bed. 2. Place your left / right knee just beyond the edge of the surface so your knee is not on the bed. You can put a towel under your left / right thigh just above your kneecap for comfort. 3. Relax your leg muscles and allow gravity to straighten your knee (extension). You should feel a stretch behind your left / right knee. 4. Hold this position for __________ seconds. 5. Scoot up so your knee is supported between repetitions. Repeat  __________ times. Complete this exercise __________ times a day. Knee flexion, active  7. Lie on your back with both legs straight. If this causes back discomfort, bend your left / right knee so your foot is flat on the floor. 8. Slowly slide your left / right heel back toward your buttocks. Stop when you feel a gentle stretch in the front of your knee or thigh (flexion). 9. Hold this position for __________ seconds. 10. Slowly slide your left / right heel back to the starting position. Repeat __________ times. Complete this exercise __________ times a day. Quadriceps stretch, prone  5. Lie on your abdomen on a firm surface, such as a bed or padded floor. 6. Bend your left / right knee and hold your ankle. If you cannot reach your ankle or pant leg, loop a belt around your foot and grab the belt instead. 7. Gently pull your heel toward your buttocks. Your knee should not slide out to the side. You should feel a stretch in the front of your thigh and knee (quadriceps). 8. Hold this position for __________ seconds. Repeat __________ times. Complete this exercise __________ times a day. Hamstring, supine 1. Lie on your back (supine position). 2. Loop a belt or towel over the ball of your left / right foot. The ball of your foot is on the walking surface, right under your toes. 3. Straighten your left / right knee and slowly pull on  the belt to raise your leg until you feel a gentle stretch behind your knee (hamstring). ? Do not let your knee bend while you do this. ? Keep your other leg flat on the floor. 4. Hold this position for __________ seconds. Repeat __________ times. Complete this exercise __________ times a day. Strengthening exercises These exercises build strength and endurance in your knee. Endurance is the ability to use your muscles for a long time, even after they get tired. Quadriceps, isometric This exercise stretches the muscles in front of your thigh (quadriceps) without moving your knee joint (isometric). 6. Lie on your back with your left / right leg extended and your other knee bent. Put a rolled towel or small pillow under your knee if told by your health care provider. 7. Slowly tense the muscles in the front of your left / right thigh. You should see your kneecap slide up toward your hip or see increased dimpling just above the knee. This motion will push the back of the knee toward the floor. 8. For __________ seconds, hold the muscle as tight as you can without increasing your pain. 9. Relax the muscles slowly and completely. Repeat __________ times. Complete this exercise __________ times a day. Straight leg raises This exercise stretches the muscles in front of your thigh (quadriceps) and the muscles that move your hips (hip flexors). 6. Lie on your back with your left / right leg extended and your other knee bent. 7. Tense the muscles in the front of your left / right thigh. You should see your kneecap slide up or see increased dimpling just above the knee. Your thigh may even shake a bit. 8. Keep these muscles tight as you raise your leg 4-6 inches (10-15 cm) off the floor. Do not let your knee bend. 9. Hold this position for __________ seconds. 10. Keep these muscles tense as you lower your leg. 11. Relax your muscles slowly and completely after each repetition. Repeat __________ times.  Complete this exercise __________ times a day. Hamstring, isometric 7. Lie on  your back on a firm surface. 8. Bend your left / right knee about __________ degrees. 9. Dig your left / right heel into the surface as if you are trying to pull it toward your buttocks. Tighten the muscles in the back of your thighs (hamstring) to "dig" as hard as you can without increasing any pain. 10. Hold this position for __________ seconds. 11. Release the tension gradually and allow your muscles to relax completely for __________ seconds after each repetition. Repeat __________ times. Complete this exercise __________ times a day. Hamstring curls If told by your health care provider, do this exercise while wearing ankle weights. Begin with __________ lb weights. Then increase the weight by 1 lb (0.5 kg) increments. Do not wear ankle weights that are more than __________ lb. 6. Lie on your abdomen with your legs straight. 7. Bend your left / right knee as far as you can without feeling pain. Keep your hips flat against the floor. 8. Hold this position for __________ seconds. 9. Slowly lower your leg to the starting position. Repeat __________ times. Complete this exercise __________ times a day. Squats This exercise strengthens the muscles in front of your thigh and knee (quadriceps). 1. Stand in front of a table, with your feet and knees pointing straight ahead. You may rest your hands on the table for balance but not for support. 2. Slowly bend your knees and lower your hips like you are going to sit in a chair. ? Keep your weight over your heels, not over your toes. ? Keep your lower legs upright so they are parallel with the table legs. ? Do not let your hips go lower than your knees. ? Do not bend lower than told by your health care provider. ? If your knee pain increases, do not bend as low. 3. Hold the squat position for __________ seconds. 4. Slowly push with your legs to return to standing. Do not  use your hands to pull yourself to standing. Repeat __________ times. Complete this exercise __________ times a day. Wall slides This exercise strengthens the muscles in front of your thigh and knee (quadriceps). 1. Lean your back against a smooth wall or door, and walk your feet out 18-24 inches (46-61 cm) from it. 2. Place your feet hip-width apart. 3. Slowly slide down the wall or door until your knees bend __________ degrees. Keep your knees over your heels, not over your toes. Keep your knees in line with your hips. 4. Hold this position for __________ seconds. Repeat __________ times. Complete this exercise __________ times a day. Straight leg raises This exercise strengthens the muscles that rotate the leg at the hip and move it away from your body (hip abductors). 1. Lie on your side with your left / right leg in the top position. Lie so your head, shoulder, knee, and hip line up. You may bend your bottom knee to help you keep your balance. 2. Roll your hips slightly forward so your hips are stacked directly over each other and your left / right knee is facing forward. 3. Leading with your heel, lift your top leg 4-6 inches (10-15 cm). You should feel the muscles in your outer hip lifting. ? Do not let your foot drift forward. ? Do not let your knee roll toward the ceiling. 4. Hold this position for __________ seconds. 5. Slowly return your leg to the starting position. 6. Let your muscles relax completely after each repetition. Repeat __________ times. Complete this exercise __________  times a day. Straight leg raises This exercise stretches the muscles that move your hips away from the front of the pelvis (hip extensors). 1. Lie on your abdomen on a firm surface. You can put a pillow under your hips if that is more comfortable. 2. Tense the muscles in your buttocks and lift your left / right leg about 4-6 inches (10-15 cm). Keep your knee straight as you lift your leg. 3. Hold this  position for __________ seconds. 4. Slowly lower your leg to the starting position. 5. Let your leg relax completely after each repetition. Repeat __________ times. Complete this exercise __________ times a day. This information is not intended to replace advice given to you by your health care provider. Make sure you discuss any questions you have with your health care provider. Document Released: 10/24/2005 Document Revised: 09/30/2018 Document Reviewed: 09/30/2018 Elsevier Patient Education  [place]. Hand Exercises Hand exercises can be helpful for almost anyone. These exercises can strengthen the hands, improve flexibility and movement, and increase blood flow to the hands. These results can make work and daily tasks easier. Hand exercises can be especially helpful for people who have joint pain from arthritis or have nerve damage from overuse (carpal tunnel syndrome). These exercises can also help people who have injured a hand. Exercises Most of these hand exercises are gentle stretching and motion exercises. It is usually safe to do them often throughout the day. Warming up your hands before exercise may help to reduce stiffness. You can do this with gentle massage or by placing your hands in warm water for 10-15 minutes. It is normal to feel some stretching, pulling, tightness, or mild discomfort as you begin new exercises. This will gradually improve. Stop an exercise right away if you feel sudden, severe pain or your pain gets worse. Ask your health care provider which exercises are best for you. Knuckle bend or "claw" fist 6. Stand or sit with your arm, hand, and all five fingers pointed straight up. Make sure to keep your wrist straight during the exercise. 7. Gently bend your fingers down toward your palm until the tips of your fingers are touching the top of your palm. Keep your big knuckle straight and just bend the small knuckles in your fingers. 8. Hold this position for  __________ seconds. 9. Straighten (extend) your fingers back to the starting position. Repeat this exercise 5-10 times with each hand. Full finger fist 11. Stand or sit with your arm, hand, and all five fingers pointed straight up. Make sure to keep your wrist straight during the exercise. 12. Gently bend your fingers into your palm until the tips of your fingers are touching the middle of your palm. 13. Hold this position for __________ seconds. 14. Extend your fingers back to the starting position, stretching every joint fully. Repeat this exercise 5-10 times with each hand. Straight fist 9. Stand or sit with your arm, hand, and all five fingers pointed straight up. Make sure to keep your wrist straight during the exercise. 10. Gently bend your fingers at the big knuckle, where your fingers meet your hand, and the middle knuckle. Keep the knuckle at the tips of your fingers straight and try to touch the bottom of your palm. 11. Hold this position for __________ seconds. 12. Extend your fingers back to the starting position, stretching every joint fully. Repeat this exercise 5-10 times with each hand. Tabletop 6. Stand or sit with your arm, hand, and all five fingers  pointed straight up. Make sure to keep your wrist straight during the exercise. 7. Gently bend your fingers at the big knuckle, where your fingers meet your hand, as far down as you can while keeping the small knuckles in your fingers straight. Think of forming a tabletop with your fingers. 8. Hold this position for __________ seconds. 9. Extend your fingers back to the starting position, stretching every joint fully. Repeat this exercise 5-10 times with each hand. Finger spread 10. Place your hand flat on a table with your palm facing down. Make sure your wrist stays straight as you do this exercise. 11. Spread your fingers and thumb apart from each other as far as you can until you feel a gentle stretch. Hold this position for  __________ seconds. 12. Bring your fingers and thumb tight together again. Hold this position for __________ seconds. Repeat this exercise 5-10 times with each hand. Making circles 12. Stand or sit with your arm, hand, and all five fingers pointed straight up. Make sure to keep your wrist straight during the exercise. 13. Make a circle by touching the tip of your thumb to the tip of your index finger. 14. Hold for __________ seconds. Then open your hand wide. 15. Repeat this motion with your thumb and each finger on your hand. Repeat this exercise 5-10 times with each hand. Thumb motion 12. Sit with your forearm resting on a table and your wrist straight. Your thumb should be facing up toward the ceiling. Keep your fingers relaxed as you move your thumb. 13. Lift your thumb up as high as you can toward the ceiling. Hold for __________ seconds. 57. Bend your thumb across your palm as far as you can, reaching the tip of your thumb for the small finger (pinkie) side of your palm. Hold for __________ seconds. Repeat this exercise 5-10 times with each hand. Grip strengthening  10. Hold a stress ball or other soft ball in the middle of your hand. 11. Slowly increase the pressure, squeezing the ball as much as you can without causing pain. Think of bringing the tips of your fingers into the middle of your palm. All of your finger joints should bend when doing this exercise. 12. Hold your squeeze for __________ seconds, then relax. Repeat this exercise 5-10 times with each hand. Contact a health care provider if:  Your hand pain or discomfort gets much worse when you do an exercise.  Your hand pain or discomfort does not improve within 2 hours after you exercise. If you have any of these problems, stop doing these exercises right away. Do not do them again unless your health care provider says that you can. Get help right away if:  You develop sudden, severe hand pain or swelling. If this  happens, stop doing these exercises right away. Do not do them again unless your health care provider says that you can. This information is not intended to replace advice given to you by your health care provider. Make sure you discuss any questions you have with your health care provider. Document Released: 11/21/2015 Document Revised: 04/02/2019 Document Reviewed: 12/11/2018 Elsevier Patient Education  2020 [employer].

## 2019-07-04 LAB — URINALYSIS, ROUTINE W REFLEX MICROSCOPIC
Bilirubin Urine: NEGATIVE
Glucose, UA: NEGATIVE
Hgb urine dipstick: NEGATIVE
Leukocytes,Ua: NEGATIVE
Nitrite: NEGATIVE
Protein, ur: NEGATIVE
Specific Gravity, Urine: 1.023 (ref 1.001–1.03)
pH: 6 (ref 5.0–8.0)

## 2019-07-04 LAB — ANA: Anti Nuclear Antibody (ANA): NEGATIVE

## 2019-07-04 LAB — BETA-2 GLYCOPROTEIN ANTIBODIES
Beta-2 Glyco 1 IgA: <9 SAU (ref <=20)
Beta-2 Glyco 1 IgM: 28 SMU — ABNORMAL HIGH (ref <=20)
Beta-2 Glyco I IgG: <9 SGU (ref <=20)

## 2019-07-04 LAB — RNP ANTIBODY: Ribonucleic Protein(ENA) Antibody, IgG: 3.6 AI — AB

## 2019-07-04 LAB — ANTI-DNA ANTIBODY, DOUBLE-STRANDED: ds DNA Ab: <1 IU/mL

## 2019-07-04 LAB — C3 AND C4
C3 Complement: 159 mg/dL (ref 83–193)
C4 Complement: 30 mg/dL (ref 15–57)

## 2019-07-04 LAB — CARDIOLIPIN ANTIBODIES, IGG, IGM, IGA
Anticardiolipin IgA: <11 [APL'U]
Anticardiolipin IgG: <14 [GPL'U]
Anticardiolipin IgM: 17 [MPL'U] — ABNORMAL HIGH

## 2019-07-04 LAB — SEDIMENTATION RATE: Sed Rate: 29 mm/h (ref 0–30)

## 2019-07-06 ENCOUNTER — Telehealth: Payer: Self-pay | Admitting: *Deleted

## 2019-07-06 DIAGNOSIS — R0602 Shortness of breath: Secondary | ICD-10-CM

## 2019-07-06 DIAGNOSIS — E039 Hypothyroidism, unspecified: Secondary | ICD-10-CM

## 2019-07-06 NOTE — Progress Notes (Signed)
UA revealed trace ketones.   Beta-2 mildly positive and cardiolipin ab indeterminate.  Sed rate WNL. ANA negative. DsDNA negative. Complements WNL.  No signs of a flare.

## 2019-07-06 NOTE — Telephone Encounter (Signed)
Message from Dr. Harrington Challenger, pt needs labs for SOB, abnormal TSH.  Lab orders placed.  Appointment made for Friday 7/17.

## 2019-07-09 DIAGNOSIS — H905 Unspecified sensorineural hearing loss: Secondary | ICD-10-CM | POA: Diagnosis not present

## 2019-07-10 ENCOUNTER — Other Ambulatory Visit: Payer: Medicare Other | Admitting: *Deleted

## 2019-07-10 ENCOUNTER — Other Ambulatory Visit: Payer: Self-pay

## 2019-07-10 DIAGNOSIS — R0602 Shortness of breath: Secondary | ICD-10-CM

## 2019-07-10 DIAGNOSIS — M5416 Radiculopathy, lumbar region: Secondary | ICD-10-CM | POA: Diagnosis not present

## 2019-07-10 DIAGNOSIS — E039 Hypothyroidism, unspecified: Secondary | ICD-10-CM

## 2019-07-10 DIAGNOSIS — M5030 Other cervical disc degeneration, unspecified cervical region: Secondary | ICD-10-CM | POA: Diagnosis not present

## 2019-07-11 LAB — TSH: TSH: 5.34 u[IU]/mL — ABNORMAL HIGH (ref 0.450–4.500)

## 2019-07-11 LAB — T3, FREE: T3, Free: 2.6 pg/mL (ref 2.0–4.4)

## 2019-07-11 LAB — BASIC METABOLIC PANEL
BUN/Creatinine Ratio: 17 (ref 12–28)
BUN: 24 mg/dL (ref 8–27)
CO2: 26 mmol/L (ref 20–29)
Calcium: 10.1 mg/dL (ref 8.7–10.3)
Chloride: 103 mmol/L (ref 96–106)
Creatinine, Ser: 1.4 mg/dL — ABNORMAL HIGH (ref 0.57–1.00)
GFR calc Af Amer: 43 mL/min/{1.73_m2} — ABNORMAL LOW (ref >59)
GFR calc non Af Amer: 38 mL/min/{1.73_m2} — ABNORMAL LOW (ref >59)
Glucose: 86 mg/dL (ref 65–99)
Potassium: 4.5 mmol/L (ref 3.5–5.2)
Sodium: 144 mmol/L (ref 134–144)

## 2019-07-11 LAB — PRO B NATRIURETIC PEPTIDE: NT-Pro BNP: 283 pg/mL (ref 0–301)

## 2019-07-11 LAB — T4, FREE: Free T4: 1.47 ng/dL (ref 0.82–1.77)

## 2019-07-15 ENCOUNTER — Telehealth: Payer: Self-pay | Admitting: *Deleted

## 2019-07-15 ENCOUNTER — Telehealth: Payer: Self-pay | Admitting: Internal Medicine

## 2019-07-15 DIAGNOSIS — R0602 Shortness of breath: Secondary | ICD-10-CM

## 2019-07-15 DIAGNOSIS — R609 Edema, unspecified: Secondary | ICD-10-CM

## 2019-07-15 MED ORDER — FUROSEMIDE 40 MG PO TABS: 20.0000 mg | ORAL_TABLET | Freq: Every day | ORAL | 3 refills | Status: DC

## 2019-07-15 MED ORDER — ISOSORBIDE MONONITRATE ER 60 MG PO TB24: 60.0000 mg | ORAL_TABLET | Freq: Every day | ORAL | 3 refills | Status: DC

## 2019-07-15 NOTE — Telephone Encounter (Signed)
Refilled isosorbide for this pt.  Other cardiac medications have been refilled recently by Dr. Caryl Comes or PCP.

## 2019-07-15 NOTE — Telephone Encounter (Signed)
Spoke to patient   She and her husband need refills on all their heart meds

## 2019-07-15 NOTE — Telephone Encounter (Signed)
Reviewed with patient face to face in office today. She verbalizes understanding. Will decrease lasix to 20 mg daily. She states most days she has no swelling.  Some days a little in her ankles. Will come for lab work bmet, [place] on 7/31.

## 2019-07-15 NOTE — Telephone Encounter (Signed)
-----   Message from Dorris Carnes V, MD sent at 07/12/2019 10:43 PM EDT ----- Thyroid function is overall normal Fluid overall appears to be OK Cr is up some from previous    Would cut lasix in half  Check BMET and BNP in 10 days

## 2019-07-15 NOTE — Addendum Note (Signed)
Addended by: Rodman Key on: 07/15/2019 10:59 AM   Modules accepted: Orders

## 2019-07-23 ENCOUNTER — Ambulatory Visit: Payer: Medicare Other | Admitting: Family Medicine

## 2019-07-24 ENCOUNTER — Other Ambulatory Visit: Payer: Self-pay

## 2019-07-27 ENCOUNTER — Other Ambulatory Visit: Payer: Self-pay

## 2019-07-27 ENCOUNTER — Other Ambulatory Visit: Payer: Medicare Other | Admitting: *Deleted

## 2019-07-27 DIAGNOSIS — R609 Edema, unspecified: Secondary | ICD-10-CM

## 2019-07-27 DIAGNOSIS — R0602 Shortness of breath: Secondary | ICD-10-CM

## 2019-07-27 LAB — BASIC METABOLIC PANEL
BUN/Creatinine Ratio: 24 (ref 12–28)
BUN: 32 mg/dL — ABNORMAL HIGH (ref 8–27)
CO2: 23 mmol/L (ref 20–29)
Calcium: 9.7 mg/dL (ref 8.7–10.3)
Chloride: 103 mmol/L (ref 96–106)
Creatinine, Ser: 1.33 mg/dL — ABNORMAL HIGH (ref 0.57–1.00)
GFR calc Af Amer: 46 mL/min/{1.73_m2} — ABNORMAL LOW (ref >59)
GFR calc non Af Amer: 40 mL/min/{1.73_m2} — ABNORMAL LOW (ref >59)
Glucose: 86 mg/dL (ref 65–99)
Potassium: 5 mmol/L (ref 3.5–5.2)
Sodium: 142 mmol/L (ref 134–144)

## 2019-07-27 LAB — PRO B NATRIURETIC PEPTIDE: NT-Pro BNP: 220 pg/mL (ref 0–301)

## 2019-07-29 DIAGNOSIS — M25511 Pain in right shoulder: Secondary | ICD-10-CM | POA: Diagnosis not present

## 2019-08-03 ENCOUNTER — Other Ambulatory Visit: Payer: Self-pay

## 2019-08-03 ENCOUNTER — Ambulatory Visit: Payer: Medicare Other | Admitting: Family Medicine

## 2019-08-04 ENCOUNTER — Other Ambulatory Visit: Payer: Self-pay | Admitting: *Deleted

## 2019-08-04 MED ORDER — FUROSEMIDE 20 MG PO TABS: 20.0000 mg | ORAL_TABLET | ORAL | 3 refills | Status: DC

## 2019-08-06 ENCOUNTER — Telehealth: Payer: Self-pay | Admitting: Internal Medicine

## 2019-08-06 NOTE — Telephone Encounter (Signed)
Left message for patient to return call.

## 2019-08-06 NOTE — Telephone Encounter (Signed)
New Message    Patient calling for her lab results.

## 2019-08-06 NOTE — Telephone Encounter (Signed)
Patient called back. She is requesting labs be mailed to her and she wanted to schedule follow up with Dr. Harrington Challenger. Patient informed of appointment date and time.

## 2019-08-11 DIAGNOSIS — M5030 Other cervical disc degeneration, unspecified cervical region: Secondary | ICD-10-CM | POA: Diagnosis not present

## 2019-08-11 DIAGNOSIS — M5136 Other intervertebral disc degeneration, lumbar region: Secondary | ICD-10-CM | POA: Diagnosis not present

## 2019-08-20 ENCOUNTER — Encounter: Payer: Self-pay | Admitting: Family Medicine

## 2019-08-20 ENCOUNTER — Ambulatory Visit (INDEPENDENT_AMBULATORY_CARE_PROVIDER_SITE_OTHER): Payer: Medicare Other | Admitting: Family Medicine

## 2019-08-20 ENCOUNTER — Other Ambulatory Visit: Payer: Self-pay

## 2019-08-20 VITALS — BP 122/62 | HR 50 | Temp 97.6°F | Resp 18 | Wt 199.6 lb

## 2019-08-20 DIAGNOSIS — M549 Dorsalgia, unspecified: Secondary | ICD-10-CM | POA: Diagnosis not present

## 2019-08-20 DIAGNOSIS — I10 Essential (primary) hypertension: Secondary | ICD-10-CM

## 2019-08-20 DIAGNOSIS — E60 Dietary zinc deficiency: Secondary | ICD-10-CM

## 2019-08-20 DIAGNOSIS — E782 Mixed hyperlipidemia: Secondary | ICD-10-CM

## 2019-08-20 DIAGNOSIS — K2100 Gastro-esophageal reflux disease with esophagitis, without bleeding: Secondary | ICD-10-CM

## 2019-08-20 DIAGNOSIS — R0609 Other forms of dyspnea: Secondary | ICD-10-CM

## 2019-08-20 DIAGNOSIS — F419 Anxiety disorder, unspecified: Secondary | ICD-10-CM

## 2019-08-20 MED ORDER — TIZANIDINE HCL 2 MG PO TABS: 2.0000 mg | ORAL_TABLET | Freq: Three times a day (TID) | ORAL | 1 refills | Status: DC | PRN

## 2019-08-20 MED ORDER — PANTOPRAZOLE SODIUM 40 MG PO TBEC: 40.0000 mg | DELAYED_RELEASE_TABLET | Freq: Every day | ORAL | 3 refills | Status: DC

## 2019-08-20 MED ORDER — LEVOTHYROXINE SODIUM 100 MCG PO TABS: ORAL_TABLET | ORAL | 1 refills | Status: DC

## 2019-08-20 MED ORDER — GABAPENTIN 300 MG PO CAPS: 300.0000 mg | ORAL_CAPSULE | Freq: Three times a day (TID) | ORAL | 1 refills | Status: DC | PRN

## 2019-08-20 NOTE — Patient Instructions (Addendum)
Omron blood pressure cuff, upper arm  Pulse oximeter  Check vitals weekly  Multivitamin with zinc  10 mg or more and selenium  Vitamin C 500 mg in evening  Vitamin D

## 2019-08-23 NOTE — Assessment & Plan Note (Signed)
Encouraged heart healthy diet, increase exercise, avoid trans fats, consider a krill oil cap daily 

## 2019-08-23 NOTE — Assessment & Plan Note (Signed)
Avoid offending foods, start probiotics. Do not eat large meals in late evening and consider raising head of bed.  

## 2019-08-23 NOTE — Assessment & Plan Note (Signed)
Well controlled, no changes to meds. Encouraged heart healthy diet such as the DASH diet and exercise as tolerated.  °

## 2019-08-23 NOTE — Progress Notes (Signed)
.   Subjective:    Patient ID: [name], female    DOB: 1946/07/06, 73 y.o.   MRN: [id number]  Chief Complaint  Patient presents with  . Follow-up    HPI Patient is in today for chronic medical concerns including hypertension, hyperlipidemia, hypothyroidism and more. No recent febrile illness or hospitalizations. She has been maintaining quarantine. She is trying to maintain a heart healthy diet. No acute concerns. Denies CP/palp/SOB/HA/congestion/fevers/GI or GU c/o. Taking meds as prescribed  Past Medical History:  Diagnosis Date  . Acute MI, subendocardial ([place])   . Arthritis   . Back pain 05/31/2013  . Benign paroxysmal positional vertigo 08/05/2016  . CAD (coronary artery disease)    a. NSTEMI 10/2011: [place] 11/19/11: pLAD 30%, oOM 60%, AVCFX 30%, CFX after OM2 30%, pRCA 60 and 70%, then 99%, AM 80-90% with TIMI 3 flow.  PCI: Promus DES x 2 to RCA; b. 06/2012 Cath: patent RCA stents w/ subtl occl of Acute Marginal (jailed)->Med rx; c. 05/2015 MV: EF 59%, mod mid infsept/inf/ap lat/ap infarct with peri-infarct isch-->Med Rx; d. 02/2016 Cath: LM nl, LAD 6m, RI 50, RCA patent stents.  . Colitis   . Facial skin lesion 01/28/2017  . GERD (gastroesophageal reflux disease)    occasional  . Hyperlipidemia   . Hypertension   . Hypertensive heart disease    a. Echocardiogram 11/19/11: Difficult acoustic windows, EF 60-65%, normal LV wall thickness, grade 1 diastolic dysfunction  . Hypoparathyroidism ([place])   . Low zinc level 11/26/2016  . Multinodular thyroid    Goiter s/p thyroidectomy in 2007 with post-op  hypocalcemia and post-op hypothyroidism/hypoparathyroidism with hypocalcemia  . Neck pain on right side 07/13/2013  . Nocturia 05/31/2013  . Pain in right axilla 03/13/2016  . Palpitations    a. 03/2012 - patient set up for event monitor but did not wear correctly -  she declined wearing a repeat monitor  . Post-surgical hypothyroidism   . Sun-damaged skin 12/05/2014  . Tubular adenoma  of colon 06/2011  . Unspecified constipation 05/31/2013  . Vertigo   . Zinc deficiency 11/26/2015    Past Surgical History:  Procedure Laterality Date  . CARDIAC CATHETERIZATION N/A 03/08/2016   Procedure: Left Heart Cath and Coronary Angiography;  Surgeon: Jettie Booze, MD;  Location: [place];  Service: Cardiovascular;  Laterality: N/A;  . COLONOSCOPY  Aenomatous polyps   07/05/2011  . COLONOSCOPY N/A 09/28/2014   Procedure: COLONOSCOPY;  Surgeon: Ladene Artist, MD;  Location: [facility];  Service: Endoscopy;  Laterality: N/A;  . CORONARY ANGIOPLASTY WITH STENT PLACEMENT    . LEFT HEART CATH AND CORONARY ANGIOGRAPHY N/A 12/09/2018   Procedure: LEFT HEART CATH AND CORONARY ANGIOGRAPHY;  Surgeon: Jettie Booze, MD;  Location: [place] [facility];  Service: Cardiovascular;  Laterality: N/A;  . LEFT HEART CATHETERIZATION WITH CORONARY ANGIOGRAM N/A 07/17/2012   Procedure: LEFT HEART CATHETERIZATION WITH CORONARY ANGIOGRAM;  Surgeon: Hillary Bow, MD;  Location: [facility] CATH LAB;  Service: Cardiovascular;  Laterality: N/A;  . PERCUTANEOUS CORONARY STENT INTERVENTION (PCI-S) N/A 11/19/2011   Procedure: PERCUTANEOUS CORONARY STENT INTERVENTION (PCI-S);  Surgeon: Hillary Bow, MD;  Location: [facility] CATH LAB;  Service: Cardiovascular;  Laterality: N/A;  . SHOULDER ARTHROSCOPY W/ ROTATOR CUFF REPAIR     right  . TOTAL THYROIDECTOMY  2007   GOITER    Family History  Problem Relation Age of Onset  . Colon cancer Brother   . Cancer Brother  COLON  . Hypertension Father   . Heart disease Father   . Heart attack Father   . Blindness Sister   . Congestive Heart Failure Sister   . Hypertension Sister   . Healthy Daughter   . Healthy Son   . Thyroid disease Sister   . Cancer Brother        multiple myelomas  . Healthy Daughter   . Healthy Daughter   . Diabetes Neg Hx   . Prostate cancer Neg Hx   . Breast cancer Neg Hx     Social History   Socioeconomic  History  . Marital status: Married    Spouse name: Not on file  . Number of children: 4  . Years of education: 24  . Highest education level: Not on file  Occupational History  . Occupation: Press photographer person at [employer]  . Financial resource strain: Not on file  . Food insecurity    Worry: Not on file    Inability: Not on file  . Transportation needs    Medical: Not on file    Non-medical: Not on file  Tobacco Use  . Smoking status: Never Smoker  . Smokeless tobacco: Never Used  Substance and Sexual Activity  . Alcohol use: Yes    Comment: rare once per month  . Drug use: No  . Sexual activity: Not Currently  Lifestyle  . Physical activity    Days per week: Not on file    Minutes per session: Not on file  . Stress: Not on file  Relationships  . Social Herbalist on phone: Not on file    Gets together: Not on file    Attends religious service: Not on file    Active member of club or organization: Not on file    Attends meetings of clubs or organizations: Not on file    Relationship status: Not on file  . Intimate partner violence    Fear of current or ex partner: Not on file    Emotionally abused: Not on file    Physically abused: Not on file    Forced sexual activity: Not on file  Other Topics Concern  . Not on file  Social History Narrative   Lives at home alone.  Her son lives near her.   Right-handed.   1 cup coffee per day.    Outpatient Medications Prior to Visit  Medication Sig Dispense Refill  . ALPRAZolam (XANAX) 0.25 MG tablet Take 1 tablet (0.25 mg total) by mouth 2 (two) times daily as needed for anxiety. 30 tablet 0  . amLODipine (NORVASC) 10 MG tablet Take 1 tablet (10 mg total) by mouth daily. 90 tablet 3  . aspirin EC 81 MG tablet Take 81 mg by mouth daily.      . B Complex-C (B-COMPLEX WITH VITAMIN C) tablet Take 1 tablet by mouth daily.    . calcitRIOL (ROCALTROL) 0.25 MCG capsule TAKE 3 CAPSULES(0.75 MCG) BY MOUTH DAILY 270  capsule 2  . cholecalciferol (VITAMIN D) 1000 UNITS tablet Take 1,000 Units by mouth daily.    . clopidogrel (PLAVIX) 75 MG tablet TAKE 1 TABLET(75 MG) BY MOUTH DAILY 90 tablet 2  . Evolocumab (REPATHA SURECLICK) [id number] MG/ML SOAJ Inject 1 pen into the skin every 14 (fourteen) days. 2 pen 11  . fluconazole (DIFLUCAN) 150 MG tablet Take 1 tablet (150 mg total) by mouth once a week. 6 tablet 1  . furosemide (LASIX) 20 MG  tablet Take 1 tablet (20 mg total) by mouth every other day. 45 tablet 3  . isosorbide mononitrate (IMDUR) 60 MG 24 hr tablet Take 1 tablet (60 mg total) by mouth daily. 90 tablet 3  . ketoconazole (NIZORAL) 2 % cream Apply 1 fingertip amount to each foot daily. 30 g 0  . Multiple Vitamin (MULTIVITAMIN WITH MINERALS) TABS tablet Take 1 tablet by mouth daily.    . nitroGLYCERIN (NITROSTAT) 0.4 MG SL tablet Place 1 tablet (0.4 mg total) under the tongue every 5 (five) minutes as needed for chest pain. 60 tablet 3  . rosuvastatin (CRESTOR) 40 MG tablet TAKE 1 TABLET(40 MG) BY MOUTH DAILY 90 tablet 2  . bisoprolol (ZEBETA) 5 MG tablet Take 1 tablet (5 mg total) by mouth 2 (two) times daily. (Patient not taking: Reported on 07/02/2019) 180 tablet 3  . gabapentin (NEURONTIN) 300 MG capsule Take 1 capsule (300 mg total) by mouth 3 (three) times daily as needed (nerve pain). 90 capsule 1  . levothyroxine (SYNTHROID, LEVOTHROID) 100 MCG tablet Take 1 tablet by mouth daily 90 tablet 1  . pantoprazole (PROTONIX) 40 MG tablet Take 1 tablet (40 mg total) by mouth daily. 90 tablet 3  . tiZANidine (ZANAFLEX) 2 MG tablet Take 1-2 tablets (2-4 mg total) by mouth every 8 (eight) hours as needed for muscle spasms. 40 tablet 1   No facility-administered medications prior to visit.     Allergies  Allergen Reactions  . Zetia [Ezetimibe] Other (See Comments)    Myalgia, paresthesias and weakness    Review of Systems  Constitutional: Negative for fever and malaise/fatigue.  HENT: Negative for  congestion.   Eyes: Negative for blurred vision.  Respiratory: Negative for shortness of breath.   Cardiovascular: Negative for chest pain, palpitations and leg swelling.  Gastrointestinal: Negative for abdominal pain, blood in stool and nausea.  Genitourinary: Negative for dysuria and frequency.  Musculoskeletal: Negative for falls.  Skin: Negative for rash.  Neurological: Negative for dizziness, loss of consciousness and headaches.  Endo/Heme/Allergies: Negative for environmental allergies.  Psychiatric/Behavioral: Negative for depression. The patient is not nervous/anxious.        Objective:    Physical Exam Vitals signs and nursing note reviewed.  Constitutional:      General: She is not in acute distress.    Appearance: She is well-developed.  HENT:     Head: Normocephalic and atraumatic.     Nose: Nose normal.  Eyes:     General:        Right eye: No discharge.        Left eye: No discharge.  Neck:     Musculoskeletal: Normal range of motion and neck supple.  Cardiovascular:     Rate and Rhythm: Normal rate and regular rhythm.     Heart sounds: No murmur.  Pulmonary:     Effort: Pulmonary effort is normal.     Breath sounds: Normal breath sounds.  Abdominal:     General: Bowel sounds are normal.     Palpations: Abdomen is soft.     Tenderness: There is no abdominal tenderness.  Skin:    General: Skin is warm and dry.  Neurological:     Mental Status: She is alert and oriented to person, place, and time.     BP 122/62 (BP Location: Left Arm, Patient Position: Sitting, Cuff Size: Normal)   Pulse (!) 50   Temp 97.6 F (36.4 C) (Temporal)   Resp 18   Wt 199  lb 9.6 oz (90.5 kg)   SpO2 97%   BMI 34.26 kg/m  Wt Readings from Last 3 Encounters:  08/20/19 199 lb 9.6 oz (90.5 kg)  07/02/19 202 lb (91.6 kg)  04/21/19 210 lb (95.3 kg)    Diabetic Foot Exam - Simple   No data filed     Lab Results  Component Value Date   WBC 8.1 04/23/2019   HGB 13.7  04/23/2019   HCT 40.6 04/23/2019   PLT 256.0 04/23/2019   GLUCOSE 86 07/27/2019   CHOL 143 04/23/2019   TRIG 186.0 (H) 04/23/2019   HDL 44.40 04/23/2019   LDLDIRECT 49.0 01/19/2019   LDLCALC 62 04/23/2019   ALT 11 04/23/2019   AST 13 04/23/2019   NA 142 07/27/2019   K 5.0 07/27/2019   CL 103 07/27/2019   CREATININE 1.33 (H) 07/27/2019   BUN 32 (H) 07/27/2019   CO2 23 07/27/2019   TSH 5.340 (H) 07/10/2019   INR 0.86 03/07/2016   HGBA1C 5.8 04/23/2019    Lab Results  Component Value Date   TSH 5.340 (H) 07/10/2019   Lab Results  Component Value Date   WBC 8.1 04/23/2019   HGB 13.7 04/23/2019   HCT 40.6 04/23/2019   MCV 87.2 04/23/2019   PLT 256.0 04/23/2019   Lab Results  Component Value Date   NA 142 07/27/2019   K 5.0 07/27/2019   CO2 23 07/27/2019   GLUCOSE 86 07/27/2019   BUN 32 (H) 07/27/2019   CREATININE 1.33 (H) 07/27/2019   BILITOT 0.4 04/23/2019   ALKPHOS 69 04/23/2019   AST 13 04/23/2019   ALT 11 04/23/2019   PROT 7.1 04/23/2019   ALBUMIN 4.1 04/23/2019   CALCIUM 9.7 07/27/2019   ANIONGAP 7 02/25/2019   GFR 49.32 (L) 04/23/2019   Lab Results  Component Value Date   CHOL 143 04/23/2019   Lab Results  Component Value Date   HDL 44.40 04/23/2019   Lab Results  Component Value Date   LDLCALC 62 04/23/2019   Lab Results  Component Value Date   TRIG 186.0 (H) 04/23/2019   Lab Results  Component Value Date   CHOLHDL 3 04/23/2019   Lab Results  Component Value Date   HGBA1C 5.8 04/23/2019       Assessment & Plan:   Problem List Items Addressed This Visit    Hyperlipidemia    Encouraged heart healthy diet, increase exercise, avoid trans fats, consider a krill oil cap daily      Essential hypertension, benign    Well controlled, no changes to meds. Encouraged heart healthy diet such as the DASH diet and exercise as tolerated.       Back pain   Relevant Medications   tiZANidine (ZANAFLEX) 2 MG tablet   Zinc deficiency     Supplement and monitor      Esophageal reflux    Avoid offending foods, start probiotics. Do not eat large meals in late evening and consider raising head of bed.       Relevant Medications   pantoprazole (PROTONIX) 40 MG tablet   Anxiety    Other Visit Diagnoses    DOE (dyspnea on exertion)    -  Primary   Relevant Orders   ECHOCARDIOGRAM COMPLETE      I have discontinued Keirston T. Mcwherter's bisoprolol. I have also changed her levothyroxine. Additionally, I am having her maintain her aspirin EC, B-complex with vitamin C, cholecalciferol, multivitamin with minerals, ketoconazole, ALPRAZolam, rosuvastatin, clopidogrel, Evolocumab,  calcitRIOL, amLODipine, nitroGLYCERIN, fluconazole, isosorbide mononitrate, furosemide, gabapentin, tiZANidine, and pantoprazole.  Meds ordered this encounter  Medications  . gabapentin (NEURONTIN) 300 MG capsule    Sig: Take 1 capsule (300 mg total) by mouth 3 (three) times daily as needed (nerve pain).    Dispense:  90 capsule    Refill:  1  . tiZANidine (ZANAFLEX) 2 MG tablet    Sig: Take 1-2 tablets (2-4 mg total) by mouth every 8 (eight) hours as needed for muscle spasms.    Dispense:  40 tablet    Refill:  1  . pantoprazole (PROTONIX) 40 MG tablet    Sig: Take 1 tablet (40 mg total) by mouth daily.    Dispense:  90 tablet    Refill:  3  . levothyroxine (SYNTHROID) 100 MCG tablet    Sig: Take 1 tablet by mouth daily    Dispense:  90 tablet    Refill:  1     Penni Homans, MD

## 2019-08-23 NOTE — Assessment & Plan Note (Signed)
Supplement and monitor 

## 2019-08-25 ENCOUNTER — Other Ambulatory Visit: Payer: Self-pay

## 2019-08-25 ENCOUNTER — Ambulatory Visit (HOSPITAL_BASED_OUTPATIENT_CLINIC_OR_DEPARTMENT_OTHER)
Admission: RE | Admit: 2019-08-25 | Discharge: 2019-08-25 | Disposition: A | Discharge status: Home / Self Care | Payer: Medicare Other | Source: Ambulatory Visit | Attending: Family Medicine | Admitting: Family Medicine

## 2019-08-25 DIAGNOSIS — R0609 Other forms of dyspnea: Secondary | ICD-10-CM | POA: Diagnosis not present

## 2019-08-25 NOTE — Progress Notes (Signed)
  Echocardiogram 2D Echocardiogram has been performed.  [name] 08/25/2019, 10:03 AM

## 2019-09-01 ENCOUNTER — Other Ambulatory Visit: Payer: Self-pay | Admitting: Family Medicine

## 2019-09-01 NOTE — Telephone Encounter (Signed)
Copied from [place] [id number]. Topic: General - Other >> Sep 01, 2019  1:41 PM Keene Breath wrote: Reason for CRM: Patient called and would like to request that ALL of her medications be refilled.  Please call her back to discuss.  CB# [phone]

## 2019-09-03 NOTE — Telephone Encounter (Signed)
All medications that are maintained by PCP was refilled on 08/20/2019.

## 2019-09-04 ENCOUNTER — Telehealth: Payer: Self-pay | Admitting: *Deleted

## 2019-09-04 NOTE — Telephone Encounter (Signed)
I think pt may be referring to her recent echo on 08/25/19.  Please advise?  Copied from [place] [phone]. Topic: General - Other >> Sep 04, 2019 11:05 AM Antonieta Iba C wrote: Reason for CRM: pt called in for her lab results.

## 2019-09-04 NOTE — Telephone Encounter (Signed)
If she is asking about the echo. It looks good, normal EF and no concerns identified that explain her shortness of breath. If she is still struggling with shortness of breath she will need to be refer for further work up

## 2019-09-08 ENCOUNTER — Telehealth: Payer: Self-pay | Admitting: *Deleted

## 2019-09-08 NOTE — Telephone Encounter (Signed)
Yes I think she should have a virtual visit because it could be a number of things. So we can discuss and figure out what lab work we should run

## 2019-09-08 NOTE — Telephone Encounter (Signed)
Patient notified of appt.  She does not wish to be referred at this time to be evaluated further.

## 2019-09-08 NOTE — Telephone Encounter (Signed)
Patient states that she have been sweating a lot and wanted to know if it could be her hormones.  I advised her that she may need ov to evaluate, but she wanted me to send message anyway.

## 2019-09-09 NOTE — Telephone Encounter (Signed)
Virtual appt made for 10/06/19

## 2019-09-18 ENCOUNTER — Ambulatory Visit: Payer: Medicare Other | Admitting: Podiatry

## 2019-09-18 ENCOUNTER — Ambulatory Visit: Payer: Medicare Other

## 2019-09-24 ENCOUNTER — Other Ambulatory Visit: Payer: Self-pay

## 2019-09-24 ENCOUNTER — Ambulatory Visit (INDEPENDENT_AMBULATORY_CARE_PROVIDER_SITE_OTHER): Payer: Medicare Other

## 2019-09-24 DIAGNOSIS — Z23 Encounter for immunization: Secondary | ICD-10-CM | POA: Diagnosis not present

## 2019-09-30 ENCOUNTER — Encounter: Payer: Self-pay | Admitting: Gastroenterology

## 2019-10-06 ENCOUNTER — Ambulatory Visit (INDEPENDENT_AMBULATORY_CARE_PROVIDER_SITE_OTHER): Payer: Medicare Other | Admitting: Family Medicine

## 2019-10-06 ENCOUNTER — Other Ambulatory Visit: Payer: Self-pay

## 2019-10-06 DIAGNOSIS — F419 Anxiety disorder, unspecified: Secondary | ICD-10-CM

## 2019-10-06 DIAGNOSIS — K2100 Gastro-esophageal reflux disease with esophagitis, without bleeding: Secondary | ICD-10-CM

## 2019-10-06 DIAGNOSIS — E782 Mixed hyperlipidemia: Secondary | ICD-10-CM

## 2019-10-06 DIAGNOSIS — E039 Hypothyroidism, unspecified: Secondary | ICD-10-CM | POA: Diagnosis not present

## 2019-10-06 DIAGNOSIS — E162 Hypoglycemia, unspecified: Secondary | ICD-10-CM

## 2019-10-06 DIAGNOSIS — R739 Hyperglycemia, unspecified: Secondary | ICD-10-CM

## 2019-10-06 DIAGNOSIS — I10 Essential (primary) hypertension: Secondary | ICD-10-CM

## 2019-10-07 DIAGNOSIS — E162 Hypoglycemia, unspecified: Secondary | ICD-10-CM | POA: Insufficient documentation

## 2019-10-07 NOTE — Assessment & Plan Note (Signed)
Encouraged heart healthy diet, increase exercise, avoid trans fats, consider a krill oil cap daily tolerating current treatment and following with cardiology

## 2019-10-07 NOTE — Assessment & Plan Note (Signed)
On Levothyroxine, continue to monitor 

## 2019-10-07 NOTE — Assessment & Plan Note (Signed)
Noting episodes of feeling shakey and sweaty and frequently it has been some time since she had eaten. Encouraged to eat only complex carbs, lots of veg and protein every time she eats. Report if no improvement.

## 2019-10-07 NOTE — Assessment & Plan Note (Signed)
Avoid offending foods, start probiotics. Do not eat large meals in late evening and consider raising head of bed.  

## 2019-10-07 NOTE — Progress Notes (Signed)
Virtual Visit via Phone Note  I connected with [name] on 10/06/19 at  2:20 PM EDT by a phone enabled telemedicine application and verified that I am speaking with the correct person using two identifiers.  Location: Patient: home Provider: office   I discussed the limitations of evaluation and management by telemedicine and the availability of in person appointments. The patient expressed understanding and agreed to proceed. Magdalene Molly, CMA was able to set the patient up on a phone visit after being unable to perform video visit.    Subjective:    Patient ID: [name], female    DOB: [birth year], 73 y.o.   MRN: [id number]  No chief complaint on file.   HPI Patient is in today for evaluation of numerous concerns including hot flashes, anxiety, hypothyroidism and more. No recent febrile illness or hospitalizations. She is having episodes intermittently. at least one a day and often occurring when it has been awhile since she has eaten. She does also endorse a high level of anxiety during the pandemic and feeling isolated. Denies CP/palp/SOB/HA/congestion/fevers/GI or GU c/o. Taking meds as prescribed  Past Medical History:  Diagnosis Date  . Acute MI, subendocardial ([place])   . Arthritis   . Back pain 05/31/2013  . Benign paroxysmal positional vertigo 08/05/2016  . CAD (coronary artery disease)    a. NSTEMI 10/2011: [place] 11/19/11: pLAD 30%, oOM 60%, AVCFX 30%, CFX after OM2 30%, pRCA 60 and 70%, then 99%, AM 80-90% with TIMI 3 flow.  PCI: Promus DES x 2 to RCA; b. 06/2012 Cath: patent RCA stents w/ subtl occl of Acute Marginal (jailed)->Med rx; c. 05/2015 MV: EF 59%, mod mid infsept/inf/ap lat/ap infarct with peri-infarct isch-->Med Rx; d. 02/2016 Cath: LM nl, LAD 68m, RI 50, RCA patent stents.  . Colitis   . Facial skin lesion 01/28/2017  . GERD (gastroesophageal reflux disease)    occasional  . Hyperlipidemia   . Hypertension   . Hypertensive heart disease    a.  Echocardiogram 11/19/11: Difficult acoustic windows, EF 60-65%, normal LV wall thickness, grade 1 diastolic dysfunction  . Hypoparathyroidism ([place])   . Low zinc level 11/26/2016  . Multinodular thyroid    Goiter s/p thyroidectomy in 2007 with post-op  hypocalcemia and post-op hypothyroidism/hypoparathyroidism with hypocalcemia  . Neck pain on right side 07/13/2013  . Nocturia 05/31/2013  . Pain in right axilla 03/13/2016  . Palpitations    a. 03/2012 - patient set up for event monitor but did not wear correctly -  she declined wearing a repeat monitor  . Post-surgical hypothyroidism   . Sun-damaged skin 12/05/2014  . Tubular adenoma of colon 06/2011  . Unspecified constipation 05/31/2013  . Vertigo   . Zinc deficiency 11/26/2015    Past Surgical History:  Procedure Laterality Date  . CARDIAC CATHETERIZATION N/A 03/08/2016   Procedure: Left Heart Cath and Coronary Angiography;  Surgeon: Jettie Booze, MD;  Location: [place] [facility];  Service: Cardiovascular;  Laterality: N/A;  . COLONOSCOPY  Aenomatous polyps   07/05/2011  . COLONOSCOPY N/A 09/28/2014   Procedure: COLONOSCOPY;  Surgeon: Ladene Artist, MD;  Location: [facility];  Service: Endoscopy;  Laterality: N/A;  . CORONARY ANGIOPLASTY WITH STENT PLACEMENT    . LEFT HEART CATH AND CORONARY ANGIOGRAPHY N/A 12/09/2018   Procedure: LEFT HEART CATH AND CORONARY ANGIOGRAPHY;  Surgeon: Jettie Booze, MD;  Location: [place] [facility];  Service: Cardiovascular;  Laterality: N/A;  . LEFT HEART CATHETERIZATION WITH CORONARY  ANGIOGRAM N/A 07/17/2012   Procedure: LEFT HEART CATHETERIZATION WITH CORONARY ANGIOGRAM;  Surgeon: Hillary Bow, MD;  Location: [facility] CATH LAB;  Service: Cardiovascular;  Laterality: N/A;  . PERCUTANEOUS CORONARY STENT INTERVENTION (PCI-S) N/A 11/19/2011   Procedure: PERCUTANEOUS CORONARY STENT INTERVENTION (PCI-S);  Surgeon: Hillary Bow, MD;  Location: [facility] CATH LAB;  Service: Cardiovascular;  Laterality:  N/A;  . SHOULDER ARTHROSCOPY W/ ROTATOR CUFF REPAIR     right  . TOTAL THYROIDECTOMY  2007   GOITER    Family History  Problem Relation Age of Onset  . Colon cancer Brother   . Cancer Brother        COLON  . Hypertension Father   . Heart disease Father   . Heart attack Father   . Blindness Sister   . Congestive Heart Failure Sister   . Hypertension Sister   . Healthy Daughter   . Healthy Son   . Thyroid disease Sister   . Cancer Brother        multiple myelomas  . Healthy Daughter   . Healthy Daughter   . Diabetes Neg Hx   . Prostate cancer Neg Hx   . Breast cancer Neg Hx     Social History   Socioeconomic History  . Marital status: Married    Spouse name: Not on file  . Number of children: 4  . Years of education: 35  . Highest education level: Not on file  Occupational History  . Occupation: Press photographer person at [employer]  . Financial resource strain: Not on file  . Food insecurity    Worry: Not on file    Inability: Not on file  . Transportation needs    Medical: Not on file    Non-medical: Not on file  Tobacco Use  . Smoking status: Never Smoker  . Smokeless tobacco: Never Used  Substance and Sexual Activity  . Alcohol use: Yes    Comment: rare once per month  . Drug use: No  . Sexual activity: Not Currently  Lifestyle  . Physical activity    Days per week: Not on file    Minutes per session: Not on file  . Stress: Not on file  Relationships  . Social Herbalist on phone: Not on file    Gets together: Not on file    Attends religious service: Not on file    Active member of club or organization: Not on file    Attends meetings of clubs or organizations: Not on file    Relationship status: Not on file  . Intimate partner violence    Fear of current or ex partner: Not on file    Emotionally abused: Not on file    Physically abused: Not on file    Forced sexual activity: Not on file  Other Topics Concern  . Not on file  Social  History Narrative   Lives at home alone.  Her son lives near her.   Right-handed.   1 cup coffee per day.    Outpatient Medications Prior to Visit  Medication Sig Dispense Refill  . ALPRAZolam (XANAX) 0.25 MG tablet Take 1 tablet (0.25 mg total) by mouth 2 (two) times daily as needed for anxiety. 30 tablet 0  . amLODipine (NORVASC) 10 MG tablet Take 1 tablet (10 mg total) by mouth daily. 90 tablet 3  . aspirin EC 81 MG tablet Take 81 mg by mouth daily.      Marland Kitchen  B Complex-C (B-COMPLEX WITH VITAMIN C) tablet Take 1 tablet by mouth daily.    . calcitRIOL (ROCALTROL) 0.25 MCG capsule TAKE 3 CAPSULES(0.75 MCG) BY MOUTH DAILY 270 capsule 2  . cholecalciferol (VITAMIN D) 1000 UNITS tablet Take 1,000 Units by mouth daily.    . clopidogrel (PLAVIX) 75 MG tablet TAKE 1 TABLET(75 MG) BY MOUTH DAILY 90 tablet 2  . Evolocumab (REPATHA SURECLICK) XX123456 MG/ML SOAJ Inject 1 pen into the skin every 14 (fourteen) days. 2 pen 11  . fluconazole (DIFLUCAN) 150 MG tablet Take 1 tablet (150 mg total) by mouth once a week. 6 tablet 1  . furosemide (LASIX) 20 MG tablet Take 1 tablet (20 mg total) by mouth every other day. 45 tablet 3  . gabapentin (NEURONTIN) 300 MG capsule Take 1 capsule (300 mg total) by mouth 3 (three) times daily as needed (nerve pain). 90 capsule 1  . isosorbide mononitrate (IMDUR) 60 MG 24 hr tablet Take 1 tablet (60 mg total) by mouth daily. 90 tablet 3  . ketoconazole (NIZORAL) 2 % cream Apply 1 fingertip amount to each foot daily. 30 g 0  . levothyroxine (SYNTHROID) 100 MCG tablet Take 1 tablet by mouth daily 90 tablet 1  . Multiple Vitamin (MULTIVITAMIN WITH MINERALS) TABS tablet Take 1 tablet by mouth daily.    . nitroGLYCERIN (NITROSTAT) 0.4 MG SL tablet Place 1 tablet (0.4 mg total) under the tongue every 5 (five) minutes as needed for chest pain. 60 tablet 3  . pantoprazole (PROTONIX) 40 MG tablet Take 1 tablet (40 mg total) by mouth daily. 90 tablet 3  . rosuvastatin (CRESTOR) 40 MG  tablet TAKE 1 TABLET(40 MG) BY MOUTH DAILY 90 tablet 2  . tiZANidine (ZANAFLEX) 2 MG tablet Take 1-2 tablets (2-4 mg total) by mouth every 8 (eight) hours as needed for muscle spasms. 40 tablet 1   No facility-administered medications prior to visit.     Allergies  Allergen Reactions  . Zetia [Ezetimibe] Other (See Comments)    Myalgia, paresthesias and weakness    Review of Systems  Constitutional: Positive for malaise/fatigue. Negative for fever.  HENT: Negative for congestion.   Eyes: Negative for blurred vision.  Respiratory: Negative for shortness of breath.   Cardiovascular: Negative for chest pain, palpitations and leg swelling.  Gastrointestinal: Negative for abdominal pain, blood in stool and nausea.  Genitourinary: Negative for dysuria and frequency.  Musculoskeletal: Negative for falls.  Skin: Negative for rash.  Neurological: Negative for dizziness, loss of consciousness and headaches.  Endo/Heme/Allergies: Negative for environmental allergies.  Psychiatric/Behavioral: Negative for depression. The patient is nervous/anxious.        Objective:    Physical Exam unatble to obtain via phone visit  There were no vitals taken for this visit. Wt Readings from Last 3 Encounters:  08/20/19 199 lb 9.6 oz (90.5 kg)  07/02/19 202 lb (91.6 kg)  04/21/19 210 lb (95.3 kg)    Diabetic Foot Exam - Simple   No data filed     Lab Results  Component Value Date   WBC 8.1 04/23/2019   HGB 13.7 04/23/2019   HCT 40.6 04/23/2019   PLT 256.0 04/23/2019   GLUCOSE 86 07/27/2019   CHOL 143 04/23/2019   TRIG 186.0 (H) 04/23/2019   HDL 44.40 04/23/2019   LDLDIRECT 49.0 01/19/2019   LDLCALC 62 04/23/2019   ALT 11 04/23/2019   AST 13 04/23/2019   NA 142 07/27/2019   K 5.0 07/27/2019   CL 103 07/27/2019  CREATININE 1.33 (H) 07/27/2019   BUN 32 (H) 07/27/2019   CO2 23 07/27/2019   TSH 5.340 (H) 07/10/2019   INR 0.86 03/07/2016   HGBA1C 5.8 04/23/2019    Lab Results   Component Value Date   TSH 5.340 (H) 07/10/2019   Lab Results  Component Value Date   WBC 8.1 04/23/2019   HGB 13.7 04/23/2019   HCT 40.6 04/23/2019   MCV 87.2 04/23/2019   PLT 256.0 04/23/2019   Lab Results  Component Value Date   NA 142 07/27/2019   K 5.0 07/27/2019   CO2 23 07/27/2019   GLUCOSE 86 07/27/2019   BUN 32 (H) 07/27/2019   CREATININE 1.33 (H) 07/27/2019   BILITOT 0.4 04/23/2019   ALKPHOS 69 04/23/2019   AST 13 04/23/2019   ALT 11 04/23/2019   PROT 7.1 04/23/2019   ALBUMIN 4.1 04/23/2019   CALCIUM 9.7 07/27/2019   ANIONGAP 7 02/25/2019   GFR 49.32 (L) 04/23/2019   Lab Results  Component Value Date   CHOL 143 04/23/2019   Lab Results  Component Value Date   HDL 44.40 04/23/2019   Lab Results  Component Value Date   LDLCALC 62 04/23/2019   Lab Results  Component Value Date   TRIG 186.0 (H) 04/23/2019   Lab Results  Component Value Date   CHOLHDL 3 04/23/2019   Lab Results  Component Value Date   HGBA1C 5.8 04/23/2019       Assessment & Plan:   Problem List Items Addressed This Visit    Hypothyroidism    On Levothyroxine, continue to monitor      Relevant Orders   TSH   Pro b natriuretic peptide   T3, free   T4, free   Hyperlipidemia - Primary    Encouraged heart healthy diet, increase exercise, avoid trans fats, consider a krill oil cap daily tolerating current treatment and following with cardiology      Relevant Orders   Lipid panel   Essential hypertension, benign    Monitor vitals weekly and report any concerns, no changes to meds. Encouraged heart healthy diet such as the DASH diet and exercise as tolerated.       Relevant Orders   Basic Metabolic Panel (BMET)   TSH   GERD (gastroesophageal reflux disease)    Avoid offending foods, start probiotics. Do not eat large meals in late evening and consider raising head of bed.       Anxiety    Continues to struggle with anxiety but does feel the Alprazolam is helpful when  she needs it.      Hypoglycemia    Noting episodes of feeling shakey and sweaty and frequently it has been some time since she had eaten. Encouraged to eat only complex carbs, lots of veg and protein every time she eats. Report if no improvement.        Other Visit Diagnoses    Hyperglycemia       Relevant Orders   Hemoglobin A1c      I have discontinued Marthe T. Begue's tiZANidine. I am also having her maintain her aspirin EC, B-complex with vitamin C, cholecalciferol, multivitamin with minerals, ketoconazole, ALPRAZolam, rosuvastatin, clopidogrel, Evolocumab, calcitRIOL, amLODipine, nitroGLYCERIN, fluconazole, isosorbide mononitrate, furosemide, gabapentin, pantoprazole, and levothyroxine.  No orders of the defined types were placed in this encounter.    I discussed the assessment and treatment plan with the patient. The patient was provided an opportunity to ask questions and all were answered. The patient  agreed with the plan and demonstrated an understanding of the instructions.   The patient was advised to call back or seek an in-person evaluation if the symptoms worsen or if the condition fails to improve as anticipated.  I provided 25 minutes of non-face-to-face time during this encounter.   Penni Homans, MD

## 2019-10-07 NOTE — Assessment & Plan Note (Signed)
Continues to struggle with anxiety but does feel the Alprazolam is helpful when she needs it.

## 2019-10-07 NOTE — Assessment & Plan Note (Signed)
Monitor vitals weekly and report any concerns, no changes to meds. Encouraged heart healthy diet such as the DASH diet and exercise as tolerated.  

## 2019-10-08 ENCOUNTER — Ambulatory Visit (INDEPENDENT_AMBULATORY_CARE_PROVIDER_SITE_OTHER): Payer: Medicare Other | Admitting: Podiatry

## 2019-10-08 ENCOUNTER — Other Ambulatory Visit: Payer: Self-pay

## 2019-10-08 DIAGNOSIS — D1724 Benign lipomatous neoplasm of skin and subcutaneous tissue of left leg: Secondary | ICD-10-CM

## 2019-10-08 DIAGNOSIS — M6740 Ganglion, unspecified site: Secondary | ICD-10-CM

## 2019-10-08 DIAGNOSIS — E1151 Type 2 diabetes mellitus with diabetic peripheral angiopathy without gangrene: Secondary | ICD-10-CM

## 2019-10-08 DIAGNOSIS — D1723 Benign lipomatous neoplasm of skin and subcutaneous tissue of right leg: Secondary | ICD-10-CM

## 2019-10-08 DIAGNOSIS — L84 Corns and callosities: Secondary | ICD-10-CM

## 2019-10-08 NOTE — Progress Notes (Signed)
Subjective:  Patient ID: JOHNINE Rice, female    DOB: [birth year],  MRN: [id number]  Chief Complaint  Patient presents with  . Nail Problem    Nail trim 1-5 bilateral  . Foot Problem    Left lateral foot has "soft bump" which changes in size, not painful, 3 week duration, no known injury.    73 y.o. female presents  for diabetic foot care. On plavix.  Also complains of mass to the left foot that changes in size also has a painful mass of the right. Denies numbness and tingling in their feet. Denies cramping in legs and thighs.  Review of Systems: Negative except as noted in the HPI. Denies N/V/F/Ch.  Past Medical History:  Diagnosis Date  . Acute MI, subendocardial ([place])   . Arthritis   . Back pain 05/31/2013  . Benign paroxysmal positional vertigo 08/05/2016  . CAD (coronary artery disease)    a. NSTEMI 10/2011: [place] 11/19/11: pLAD 30%, oOM 60%, AVCFX 30%, CFX after OM2 30%, pRCA 60 and 70%, then 99%, AM 80-90% with TIMI 3 flow.  PCI: Promus DES x 2 to RCA; b. 06/2012 Cath: patent RCA stents w/ subtl occl of Acute Marginal (jailed)->Med rx; c. 05/2015 MV: EF 59%, mod mid infsept/inf/ap lat/ap infarct with peri-infarct isch-->Med Rx; d. 02/2016 Cath: LM nl, LAD 68m, RI 50, RCA patent stents.  . Colitis   . Facial skin lesion 01/28/2017  . GERD (gastroesophageal reflux disease)    occasional  . Hyperlipidemia   . Hypertension   . Hypertensive heart disease    a. Echocardiogram 11/19/11: Difficult acoustic windows, EF 60-65%, normal LV wall thickness, grade 1 diastolic dysfunction  . Hypoparathyroidism ([place])   . Low zinc level 11/26/2016  . Multinodular thyroid    Goiter s/p thyroidectomy in 2007 with post-op  hypocalcemia and post-op hypothyroidism/hypoparathyroidism with hypocalcemia  . Neck pain on right side 07/13/2013  . Nocturia 05/31/2013  . Pain in right axilla 03/13/2016  . Palpitations    a. 03/2012 - patient set up for event monitor but did not wear correctly -  she declined  wearing a repeat monitor  . Post-surgical hypothyroidism   . Sun-damaged skin 12/05/2014  . Tubular adenoma of colon 06/2011  . Unspecified constipation 05/31/2013  . Vertigo   . Zinc deficiency 11/26/2015    Current Outpatient Medications:  .  ALPRAZolam (XANAX) 0.25 MG tablet, Take 1 tablet (0.25 mg total) by mouth 2 (two) times daily as needed for anxiety., Disp: 30 tablet, Rfl: 0 .  amLODipine (NORVASC) 10 MG tablet, Take 1 tablet (10 mg total) by mouth daily., Disp: 90 tablet, Rfl: 3 .  amoxicillin-clavulanate (AUGMENTIN) 500-125 MG tablet, amoxicillin 500 mg-potassium clavulanate 125 mg tablet  TAKE ONE TABLET BY MOUTH BID FOR 7 DAYS, Disp: , Rfl:  .  aspirin EC 81 MG tablet, Take 81 mg by mouth daily.  , Disp: , Rfl:  .  B Complex-C (B-COMPLEX WITH VITAMIN C) tablet, Take 1 tablet by mouth daily., Disp: , Rfl:  .  bisoprolol (ZEBETA) 5 MG tablet, , Disp: , Rfl:  .  calcitRIOL (ROCALTROL) 0.25 MCG capsule, TAKE 3 CAPSULES(0.75 MCG) BY MOUTH DAILY, Disp: 270 capsule, Rfl: 2 .  cholecalciferol (VITAMIN D) 1000 UNITS tablet, Take 1,000 Units by mouth daily., Disp: , Rfl:  .  clopidogrel (PLAVIX) 75 MG tablet, TAKE 1 TABLET(75 MG) BY MOUTH DAILY, Disp: 90 tablet, Rfl: 2 .  Evolocumab (REPATHA SURECLICK) [id number] MG/ML SOAJ, Inject 1 pen into the  skin every 14 (fourteen) days., Disp: 2 pen, Rfl: 11 .  fluconazole (DIFLUCAN) 150 MG tablet, Take 1 tablet (150 mg total) by mouth once a week., Disp: 6 tablet, Rfl: 1 .  furosemide (LASIX) 20 MG tablet, Take 1 tablet (20 mg total) by mouth every other day., Disp: 45 tablet, Rfl: 3 .  gabapentin (NEURONTIN) 300 MG capsule, Take 1 capsule (300 mg total) by mouth 3 (three) times daily as needed (nerve pain)., Disp: 90 capsule, Rfl: 1 .  isosorbide mononitrate (IMDUR) 60 MG 24 hr tablet, Take 1 tablet (60 mg total) by mouth daily., Disp: 90 tablet, Rfl: 3 .  ketoconazole (NIZORAL) 2 % cream, Apply 1 fingertip amount to each foot daily., Disp: 30 g, Rfl: 0 .   levothyroxine (SYNTHROID) 100 MCG tablet, Take 1 tablet by mouth daily, Disp: 90 tablet, Rfl: 1 .  methylPREDNISolone (MEDROL) 4 MG tablet, methylprednisolone 4 mg tablet, Disp: , Rfl:  .  Multiple Vitamin (MULTIVITAMIN WITH MINERALS) TABS tablet, Take 1 tablet by mouth daily., Disp: , Rfl:  .  nitroGLYCERIN (NITROSTAT) 0.4 MG SL tablet, Place 1 tablet (0.4 mg total) under the tongue every 5 (five) minutes as needed for chest pain., Disp: 60 tablet, Rfl: 3 .  pantoprazole (PROTONIX) 40 MG tablet, Take 1 tablet (40 mg total) by mouth daily., Disp: 90 tablet, Rfl: 3 .  rosuvastatin (CRESTOR) 40 MG tablet, TAKE 1 TABLET(40 MG) BY MOUTH DAILY, Disp: 90 tablet, Rfl: 2 .  traMADol (ULTRAM) 50 MG tablet, tramadol 50 mg tablet  TK 1 T PO TID PRN, Disp: , Rfl:   Social History   Tobacco Use  Smoking Status Never Smoker  Smokeless Tobacco Never Used    Allergies  Allergen Reactions  . Zetia [Ezetimibe] Other (See Comments)    Myalgia, paresthesias and weakness   Objective:   There were no vitals filed for this visit. There is no height or weight on file to calculate BMI. Constitutional Well developed. Well nourished.  Vascular Dorsalis pedis pulses present 1+ bilaterally  Posterior tibial pulses barely palpable bilaterally  Pedal hair growth diminished. Capillary refill normal to all digits.  No cyanosis or clubbing noted.  Neurologic Normal speech. Oriented to person, place, and time. Epicritic sensation to light touch grossly present bilaterally. Protective sensation with 5.07 monofilament  present bilaterally.  Dermatologic Nails elongated, thickened, dystrophic. Xerosis with scaling plantar foot bilat. No open wounds. HPK R medial hallux IPJ.  Orthopedic: Normal joint ROM without pain or crepitus bilaterally. No visible deformities. No bony tenderness. Soft fluctuant moveable painful masses over the lateral foot intimate to the peroneus brevis tendon   Assessment:   1.  Diabetes mellitus type 2 with peripheral artery disease (HCC)   2. Callus   3. Ganglion   4. Lipoma of left lower extremity   5. Lipoma of right lower extremity    Plan:  Patient was evaluated and treated and all questions answered.  Diabetes with coagulation defect, Onychomycosis -Routine foot care as below  Procedure: Nail Debridement Rationale: Patient meets criteria for routine foot care due to Coag defect Type of Debridement: manual, sharp debridement. Instrumentation: Nail nipper, rotary burr. Number of Nails: 10  Procedure: Paring of Lesion Rationale: painful hyperkeratotic lesion Type of Debridement: manual, sharp debridement. Instrumentation: burr Number of Lesions: 1  Ganglion vs Lipoma -Discussed with patient attempting aspiration of the lesions.  Patient verbalized that she wish to proceed.  Each lesion was anesthetized with 3 cc of lidocaine 1% with epinephrine.  Aspiration was performed without definite aspiration.  In this instance the lesions are likely lipomatous.  Discussed possible surgical excision patient would like to think it over.  Return in about 3 months (around 01/08/2020).

## 2019-10-12 ENCOUNTER — Other Ambulatory Visit (INDEPENDENT_AMBULATORY_CARE_PROVIDER_SITE_OTHER): Payer: Medicare Other

## 2019-10-12 ENCOUNTER — Other Ambulatory Visit: Payer: Self-pay

## 2019-10-12 DIAGNOSIS — R739 Hyperglycemia, unspecified: Secondary | ICD-10-CM | POA: Diagnosis not present

## 2019-10-12 DIAGNOSIS — I10 Essential (primary) hypertension: Secondary | ICD-10-CM | POA: Diagnosis not present

## 2019-10-12 DIAGNOSIS — E782 Mixed hyperlipidemia: Secondary | ICD-10-CM | POA: Diagnosis not present

## 2019-10-12 DIAGNOSIS — E039 Hypothyroidism, unspecified: Secondary | ICD-10-CM | POA: Diagnosis not present

## 2019-10-12 LAB — HEMOGLOBIN A1C: Hgb A1c MFr Bld: 5.9 % (ref 4.6–6.5)

## 2019-10-12 LAB — BASIC METABOLIC PANEL
BUN: 25 mg/dL — ABNORMAL HIGH (ref 6–23)
CO2: 32 mEq/L (ref 19–32)
Calcium: 9.2 mg/dL (ref 8.4–10.5)
Chloride: 103 mEq/L (ref 96–112)
Creatinine, Ser: 1.14 mg/dL (ref 0.40–1.20)
GFR: 46.77 mL/min — ABNORMAL LOW (ref >60.00)
Glucose, Bld: 89 mg/dL (ref 70–99)
Potassium: 4.4 mEq/L (ref 3.5–5.1)
Sodium: 143 mEq/L (ref 135–145)

## 2019-10-12 LAB — T3, FREE: T3, Free: 3.2 pg/mL (ref 2.3–4.2)

## 2019-10-12 LAB — LIPID PANEL
Cholesterol: 102 mg/dL (ref 0–200)
HDL: 38.9 mg/dL — ABNORMAL LOW (ref >39.00)
NonHDL: 63.13
Total CHOL/HDL Ratio: 3
Triglycerides: 224 mg/dL — ABNORMAL HIGH (ref 0.0–149.0)
VLDL: 44.8 mg/dL — ABNORMAL HIGH (ref 0.0–40.0)

## 2019-10-12 LAB — T4, FREE: Free T4: 0.96 ng/dL (ref 0.60–1.60)

## 2019-10-12 LAB — LDL CHOLESTEROL, DIRECT: Direct LDL: 33 mg/dL

## 2019-10-12 LAB — TSH: TSH: 5.82 u[IU]/mL — ABNORMAL HIGH (ref 0.35–4.50)

## 2019-10-13 ENCOUNTER — Other Ambulatory Visit (HOSPITAL_BASED_OUTPATIENT_CLINIC_OR_DEPARTMENT_OTHER): Payer: Self-pay | Admitting: Family Medicine

## 2019-10-13 DIAGNOSIS — Z1231 Encounter for screening mammogram for malignant neoplasm of breast: Secondary | ICD-10-CM

## 2019-10-14 ENCOUNTER — Ambulatory Visit (HOSPITAL_BASED_OUTPATIENT_CLINIC_OR_DEPARTMENT_OTHER): Payer: Medicare Other

## 2019-10-16 ENCOUNTER — Telehealth: Payer: Self-pay | Admitting: *Deleted

## 2019-10-16 NOTE — Telephone Encounter (Signed)
She already has a 3 month f/u appt scheduled for 11/26/2019 with Dr. Charlett Blake.

## 2019-10-19 ENCOUNTER — Other Ambulatory Visit: Payer: Self-pay

## 2019-10-19 ENCOUNTER — Ambulatory Visit (HOSPITAL_BASED_OUTPATIENT_CLINIC_OR_DEPARTMENT_OTHER)
Admission: RE | Admit: 2019-10-19 | Discharge: 2019-10-19 | Disposition: A | Discharge status: Home / Self Care | Payer: Medicare Other | Source: Ambulatory Visit | Attending: Family Medicine | Admitting: Family Medicine

## 2019-10-19 DIAGNOSIS — Z1231 Encounter for screening mammogram for malignant neoplasm of breast: Secondary | ICD-10-CM | POA: Insufficient documentation

## 2019-10-19 MED ORDER — LEVOTHYROXINE SODIUM 112 MCG PO TABS: 112.0000 ug | ORAL_TABLET | Freq: Every day | ORAL | 3 refills | Status: DC

## 2019-10-19 NOTE — Addendum Note (Signed)
Addended by: Magdalene Molly A on: 10/19/2019 02:52 PM   Modules accepted: Orders

## 2019-10-22 NOTE — Progress Notes (Signed)
Cardiology Office Note   Date:  10/23/2019   ID:  [name], DOB 03-23-1946, MRN [id number]  PCP:  Mosie Lukes, MD  Cardiologist:   Dorris Carnes, MD   F/U of CAD    History of Present Illness:  [name] is a 73 y.o. female with CAD - s/p NSTEMI 10/2011 with DESx2  Other issues include thyroidectomy, GERD, HTN, HLD, vertigo.Last myovue showed inferolateral scar with minimal periinfarct ischemia The pt was seen in ED for CP in December 2019   L heart cath in Dec 2019 showed:  LAD with 25%  OM  with 100% with Left to R  collaterals  RCA with patent stent   Normal LVEF     I saw her as televisit in APril 2020 Labs this summer recomm cutting back on lasix   Appeared a little dry    Echo in early Sept   LVEF and RVEF were normal   Mild diastolic dysfunction   The pt denies CP  Breathing is OK    Sleeping better now that thyroid adjusted    Current Meds  Medication Sig  . ALPRAZolam (XANAX) 0.25 MG tablet Take 1 tablet (0.25 mg total) by mouth 2 (two) times daily as needed for anxiety.  Marland Kitchen amLODipine (NORVASC) 10 MG tablet Take 1 tablet (10 mg total) by mouth daily.  Marland Kitchen aspirin EC 81 MG tablet Take 81 mg by mouth daily.    . B Complex-C (B-COMPLEX WITH VITAMIN C) tablet Take 1 tablet by mouth daily.  . bisoprolol (ZEBETA) 5 MG tablet   . calcitRIOL (ROCALTROL) 0.25 MCG capsule TAKE 3 CAPSULES(0.75 MCG) BY MOUTH DAILY  . cholecalciferol (VITAMIN D) 1000 UNITS tablet Take 1,000 Units by mouth daily.  . clopidogrel (PLAVIX) 75 MG tablet TAKE 1 TABLET(75 MG) BY MOUTH DAILY  . Evolocumab (REPATHA SURECLICK) XX123456 MG/ML SOAJ Inject 1 pen into the skin every 14 (fourteen) days.  . isosorbide mononitrate (IMDUR) 60 MG 24 hr tablet Take 1 tablet (60 mg total) by mouth daily.  Marland Kitchen ketoconazole (NIZORAL) 2 % cream Apply 1 fingertip amount to each foot daily.  Marland Kitchen levothyroxine (SYNTHROID) 112 MCG tablet Take 1 tablet (112 mcg total) by mouth daily.  . methylPREDNISolone (MEDROL) 4  MG tablet methylprednisolone 4 mg tablet  . Multiple Vitamin (MULTIVITAMIN WITH MINERALS) TABS tablet Take 1 tablet by mouth daily.  . nitroGLYCERIN (NITROSTAT) 0.4 MG SL tablet Place 1 tablet (0.4 mg total) under the tongue every 5 (five) minutes as needed for chest pain.  . pantoprazole (PROTONIX) 40 MG tablet Take 1 tablet (40 mg total) by mouth daily.  . rosuvastatin (CRESTOR) 40 MG tablet TAKE 1 TABLET(40 MG) BY MOUTH DAILY  . traMADol (ULTRAM) 50 MG tablet tramadol 50 mg tablet  TK 1 T PO TID PRN  . [DISCONTINUED] amLODipine (NORVASC) 10 MG tablet Take 1 tablet (10 mg total) by mouth daily.  . [DISCONTINUED] pantoprazole (PROTONIX) 40 MG tablet Take 1 tablet (40 mg total) by mouth daily.     Allergies:   Zetia [ezetimibe]   Past Medical History:  Diagnosis Date  . Acute MI, subendocardial ([place])   . Arthritis   . Back pain 05/31/2013  . Benign paroxysmal positional vertigo 08/05/2016  . CAD (coronary artery disease)    a. NSTEMI 10/2011: [name] 11/19/11: pLAD 30%, oOM 60%, AVCFX 30%, CFX after OM2 30%, pRCA 60 and 70%, then 99%, AM 80-90% with TIMI 3 flow.  PCI: Promus DES x 2  to RCA; b. 06/2012 Cath: patent RCA stents w/ subtl occl of Acute Marginal (jailed)->Med rx; c. 05/2015 MV: EF 59%, mod mid infsept/inf/ap lat/ap infarct with peri-infarct isch-->Med Rx; d. 02/2016 Cath: LM nl, LAD 51m, RI 50, RCA patent stents.  . Colitis   . Facial skin lesion 01/28/2017  . GERD (gastroesophageal reflux disease)    occasional  . Hyperlipidemia   . Hypertension   . Hypertensive heart disease    a. Echocardiogram 11/19/11: Difficult acoustic windows, EF 60-65%, normal LV wall thickness, grade 1 diastolic dysfunction  . Hypoparathyroidism ([place])   . Low zinc level 11/26/2016  . Multinodular thyroid    Goiter s/p thyroidectomy in 2007 with post-op  hypocalcemia and post-op hypothyroidism/hypoparathyroidism with hypocalcemia  . Neck pain on right side 07/13/2013  . Nocturia 05/31/2013  . Pain in right  axilla 03/13/2016  . Palpitations    a. 03/2012 - patient set up for event monitor but did not wear correctly -  she declined wearing a repeat monitor  . Post-surgical hypothyroidism   . Sun-damaged skin 12/05/2014  . Tubular adenoma of colon 06/2011  . Unspecified constipation 05/31/2013  . Vertigo   . Zinc deficiency 11/26/2015    Past Surgical History:  Procedure Laterality Date  . CARDIAC CATHETERIZATION N/A 03/08/2016   Procedure: Left Heart Cath and Coronary Angiography;  Surgeon: Jettie Booze, MD;  Location: [facility] [facility];  Service: Cardiovascular;  Laterality: N/A;  . COLONOSCOPY  Aenomatous polyps   07/05/2011  . COLONOSCOPY N/A 09/28/2014   Procedure: COLONOSCOPY;  Surgeon: Ladene Artist, MD;  Location: [facility];  Service: Endoscopy;  Laterality: N/A;  . CORONARY ANGIOPLASTY WITH STENT PLACEMENT    . LEFT HEART CATH AND CORONARY ANGIOGRAPHY N/A 12/09/2018   Procedure: LEFT HEART CATH AND CORONARY ANGIOGRAPHY;  Surgeon: Jettie Booze, MD;  Location: [facility];  Service: Cardiovascular;  Laterality: N/A;  . LEFT HEART CATHETERIZATION WITH CORONARY ANGIOGRAM N/A 07/17/2012   Procedure: LEFT HEART CATHETERIZATION WITH CORONARY ANGIOGRAM;  Surgeon: Hillary Bow, MD;  Location: [facility] CATH LAB;  Service: Cardiovascular;  Laterality: N/A;  . PERCUTANEOUS CORONARY STENT INTERVENTION (PCI-S) N/A 11/19/2011   Procedure: PERCUTANEOUS CORONARY STENT INTERVENTION (PCI-S);  Surgeon: Hillary Bow, MD;  Location: [facility] CATH LAB;  Service: Cardiovascular;  Laterality: N/A;  . SHOULDER ARTHROSCOPY W/ ROTATOR CUFF REPAIR     right  . TOTAL THYROIDECTOMY  2007   GOITER     Social History:  The patient  reports that she has never smoked. She has never used smokeless tobacco. She reports current alcohol use. She reports that she does not use drugs.   Family History:  The patient's family history includes Blindness in her sister; Cancer in her brother and brother;  Colon cancer in her brother; Congestive Heart Failure in her sister; Healthy in her daughter, daughter, daughter, and son; Heart attack in her father; Heart disease in her father; Hypertension in her father and sister; Thyroid disease in her sister.    ROS:  Please see the history of present illness. All other systems are reviewed and  Negative to the above problem except as noted.    PHYSICAL EXAM: VS:  BP 128/60   Pulse 65   Ht 5\' 4"  (1.626 m)   Wt 204 lb (92.5 kg)   SpO2 95%   BMI 35.02 kg/m   GEN: Well nourished, well developed, in no acute distress  HEENT: normal  Neck: JVP is normal  No  carotid bruits Cardiac: RRR; no murmurs, rubs, or gallops,no edema  Respiratory:  clear to auscultation bilaterally, normal work of breathing GI: soft, nontender, nondistended, + BS  No hepatomegaly  MS: no deformity Moving all extremities   Skin: warm and dry, no rash Neuro:  Strength and sensation are intact Psych: euthymic mood, full affect   EKG:  EKG is not ordered today.   Lipid Panel    Component Value Date/Time   CHOL 102 10/12/2019 1020   CHOL 101 10/07/2018 1435   TRIG 224.0 (H) 10/12/2019 1020   HDL 38.90 (L) 10/12/2019 1020   HDL 48 10/07/2018 1435   CHOLHDL 3 10/12/2019 1020   VLDL 44.8 (H) 10/12/2019 1020   LDLCALC 62 04/23/2019 1314   LDLCALC 24 10/07/2018 1435   LDLDIRECT 33.0 10/12/2019 1020      Wt Readings from Last 3 Encounters:  10/23/19 204 lb (92.5 kg)  08/20/19 199 lb 9.6 oz (90.5 kg)  07/02/19 202 lb (91.6 kg)      ASSESSMENT AND PLAN:  1  CAD   Pt without symptoms of angina   I would keep on current regimen    2   HTN   BP is good   Keep on current regimen  3  HL   HL   Keep on [id number]   Last LDL a few days ago  33     F/U in Spring 2021  Current medicines are reviewed at length with the patient today.  The patient does not have concerns regarding medicines.  Signed, Dorris Carnes, MD  10/23/2019 11:35 AM    [address], [place], [place]  [id number] Phone: [phone]; Fax: [phone]

## 2019-10-23 ENCOUNTER — Other Ambulatory Visit: Payer: Self-pay

## 2019-10-23 ENCOUNTER — Ambulatory Visit (INDEPENDENT_AMBULATORY_CARE_PROVIDER_SITE_OTHER): Payer: Medicare Other | Admitting: Internal Medicine

## 2019-10-23 ENCOUNTER — Encounter: Payer: Self-pay | Admitting: Internal Medicine

## 2019-10-23 DIAGNOSIS — E039 Hypothyroidism, unspecified: Secondary | ICD-10-CM | POA: Diagnosis not present

## 2019-10-23 DIAGNOSIS — I10 Essential (primary) hypertension: Secondary | ICD-10-CM

## 2019-10-23 DIAGNOSIS — E876 Hypokalemia: Secondary | ICD-10-CM | POA: Diagnosis not present

## 2019-10-23 DIAGNOSIS — K2100 Gastro-esophageal reflux disease with esophagitis, without bleeding: Secondary | ICD-10-CM

## 2019-10-23 MED ORDER — AMLODIPINE BESYLATE 10 MG PO TABS: 10.0000 mg | ORAL_TABLET | Freq: Every day | ORAL | 3 refills | Status: DC

## 2019-10-23 MED ORDER — PANTOPRAZOLE SODIUM 40 MG PO TBEC: 40.0000 mg | DELAYED_RELEASE_TABLET | Freq: Every day | ORAL | 3 refills | Status: DC

## 2019-10-23 NOTE — Patient Instructions (Signed)
Medication Instructions:  No changes *If you need a refill on your cardiac medications before your next appointment, please call your pharmacy*  Lab Work: none If you have labs (blood work) drawn today and your tests are completely normal, you will receive your results only by: Marland Kitchen MyChart Message (if you have MyChart) OR . A paper copy in the mail If you have any lab test that is abnormal or we need to change your treatment, we will call you to review the results.  Testing/Procedures: none  Follow-Up: At [facility], you and your health needs are our priority.  As part of our continuing mission to provide you with exceptional heart care, we have created designated Provider Care Teams.  These Care Teams include your primary Cardiologist (physician) and Advanced Practice Providers (APPs -  Physician Assistants and Nurse Practitioners) who all work together to provide you with the care you need, when you need it.  Your next appointment:   5 months  The format for your next appointment:   In Person  Provider:   Dorris Carnes, MD  Other Instructions

## 2019-11-05 ENCOUNTER — Telehealth: Payer: Self-pay | Admitting: Family Medicine

## 2019-11-05 ENCOUNTER — Other Ambulatory Visit: Payer: Self-pay

## 2019-11-05 NOTE — Telephone Encounter (Signed)
I have too many patients and meeting tomorrow will not be able to come into the office

## 2019-11-05 NOTE — Telephone Encounter (Signed)
Pt notified and agreed to Monday 8:20am appt.

## 2019-11-05 NOTE — Telephone Encounter (Signed)
How ever I could do 8:20 on Monday morning

## 2019-11-05 NOTE — Telephone Encounter (Signed)
Pt called stating she needed to come in office tomorrow for scheduled appt. I advised her that Dr. Charlett Blake is not in the office on Friday and change. She says she needs an emergency appt with Dr. Charlett Blake for discomfort and bruising on her breast. She said she was told her mammogram was fine.   Please advise on emergency appt for pt.

## 2019-11-06 ENCOUNTER — Ambulatory Visit: Payer: Medicare Other | Admitting: Family Medicine

## 2019-11-09 ENCOUNTER — Encounter: Payer: Self-pay | Admitting: Family Medicine

## 2019-11-09 ENCOUNTER — Telehealth: Payer: Self-pay | Admitting: *Deleted

## 2019-11-09 ENCOUNTER — Ambulatory Visit (INDEPENDENT_AMBULATORY_CARE_PROVIDER_SITE_OTHER): Payer: Medicare Other | Admitting: Family Medicine

## 2019-11-09 ENCOUNTER — Other Ambulatory Visit: Payer: Self-pay

## 2019-11-09 ENCOUNTER — Other Ambulatory Visit: Payer: Self-pay | Admitting: Internal Medicine

## 2019-11-09 VITALS — BP 120/60 | HR 57 | Temp 97.4°F | Resp 18 | Wt 202.2 lb

## 2019-11-09 DIAGNOSIS — L989 Disorder of the skin and subcutaneous tissue, unspecified: Secondary | ICD-10-CM | POA: Insufficient documentation

## 2019-11-09 DIAGNOSIS — E60 Dietary zinc deficiency: Secondary | ICD-10-CM

## 2019-11-09 DIAGNOSIS — I10 Essential (primary) hypertension: Secondary | ICD-10-CM

## 2019-11-09 DIAGNOSIS — E782 Mixed hyperlipidemia: Secondary | ICD-10-CM

## 2019-11-09 DIAGNOSIS — E890 Postprocedural hypothyroidism: Secondary | ICD-10-CM

## 2019-11-09 DIAGNOSIS — E559 Vitamin D deficiency, unspecified: Secondary | ICD-10-CM

## 2019-11-09 DIAGNOSIS — R079 Chest pain, unspecified: Secondary | ICD-10-CM

## 2019-11-09 DIAGNOSIS — N644 Mastodynia: Secondary | ICD-10-CM | POA: Insufficient documentation

## 2019-11-09 DIAGNOSIS — E039 Hypothyroidism, unspecified: Secondary | ICD-10-CM

## 2019-11-09 DIAGNOSIS — K2100 Gastro-esophageal reflux disease with esophagitis, without bleeding: Secondary | ICD-10-CM | POA: Diagnosis not present

## 2019-11-09 DIAGNOSIS — E876 Hypokalemia: Secondary | ICD-10-CM

## 2019-11-09 LAB — VITAMIN D 25 HYDROXY (VIT D DEFICIENCY, FRACTURES): VITD: 52.14 ng/mL (ref 30.00–100.00)

## 2019-11-09 LAB — SEDIMENTATION RATE: Sed Rate: 20 mm/hr (ref 0–30)

## 2019-11-09 LAB — CBC
HCT: 40.3 % (ref 36.0–46.0)
Hemoglobin: 13.2 g/dL (ref 12.0–15.0)
MCHC: 32.6 g/dL (ref 30.0–36.0)
MCV: 89.4 fl (ref 78.0–100.0)
Platelets: 241 10*3/uL (ref 150.0–400.0)
RBC: 4.51 Mil/uL (ref 3.87–5.11)
RDW: 14.5 % (ref 11.5–15.5)
WBC: 8.3 10*3/uL (ref 4.0–10.5)

## 2019-11-09 LAB — COMPREHENSIVE METABOLIC PANEL
ALT: 14 U/L (ref 0–35)
AST: 14 U/L (ref 0–37)
Albumin: 4.1 g/dL (ref 3.5–5.2)
Alkaline Phosphatase: 76 U/L (ref 39–117)
BUN: 31 mg/dL — ABNORMAL HIGH (ref 6–23)
CO2: 29 mEq/L (ref 19–32)
Calcium: 8.8 mg/dL (ref 8.4–10.5)
Chloride: 104 mEq/L (ref 96–112)
Creatinine, Ser: 1.13 mg/dL (ref 0.40–1.20)
GFR: 47.24 mL/min — ABNORMAL LOW (ref >60.00)
Glucose, Bld: 98 mg/dL (ref 70–99)
Potassium: 4.5 mEq/L (ref 3.5–5.1)
Sodium: 144 mEq/L (ref 135–145)
Total Bilirubin: 0.5 mg/dL (ref 0.2–1.2)
Total Protein: 7 g/dL (ref 6.0–8.3)

## 2019-11-09 MED ORDER — NITROGLYCERIN 0.4 MG SL SUBL: 0.4000 mg | SUBLINGUAL_TABLET | SUBLINGUAL | 3 refills | Status: DC | PRN

## 2019-11-09 MED ORDER — PANTOPRAZOLE SODIUM 40 MG PO TBEC: 40.0000 mg | DELAYED_RELEASE_TABLET | Freq: Every day | ORAL | 3 refills | Status: DC

## 2019-11-09 NOTE — Assessment & Plan Note (Signed)
She had a normal mammogram but she has had a discomfort on palpation in same spot for about a month and a 1/2. It is improving and she thinks it began after her flu shot. Will proceed with ultrasound to confirm no concerns

## 2019-11-09 NOTE — Assessment & Plan Note (Signed)
Well controlled, no changes to meds. Encouraged heart healthy diet such as the DASH diet and exercise as tolerated.  °

## 2019-11-09 NOTE — Telephone Encounter (Signed)
Lab is unable to add zinc as it has to be drawn in a specific tube that was not drawn today. Pt will need to return for lab visit to complete zinc. Please advise if you want pt to return for a lab visit only?

## 2019-11-09 NOTE — Progress Notes (Signed)
Subjective:    Patient ID: [name], female    DOB: [birth year], 73 y.o.   MRN: [id number]  No chief complaint on file.   HPI Patient is in today for follow up on chronic medical concerns including hyperlipidemia, hypertension, hypothyroidism and more. She is feeling mostly well. No recent febrile illness or hospitalizations. She is tolerating the increased dose of Levothyroxine. She is concerned about some left breast tenderness which she reports started shortly after taking her flu shot. No lump and only tender with palpation, also improving. MGM was normal. She also notes a small nodule on her right arm that is nontender and has been present a month or so as well. No redness or warmth, not growing. Denies CP/palp/SOB/HA/congestion/fevers/GI or GU c/o. Taking meds as prescribed  Past Medical History:  Diagnosis Date   Acute MI, subendocardial ([place])    Arthritis    Back pain 05/31/2013   Benign paroxysmal positional vertigo 08/05/2016   CAD (coronary artery disease)    a. NSTEMI 10/2011: [place] 11/19/11: pLAD 30%, oOM 60%, AVCFX 30%, CFX after OM2 30%, pRCA 60 and 70%, then 99%, AM 80-90% with TIMI 3 flow.  PCI: Promus DES x 2 to RCA; b. 06/2012 Cath: patent RCA stents w/ subtl occl of Acute Marginal (jailed)->Med rx; c. 05/2015 MV: EF 59%, mod mid infsept/inf/ap lat/ap infarct with peri-infarct isch-->Med Rx; d. 02/2016 Cath: LM nl, LAD 27m, RI 50, RCA patent stents.   Colitis    Facial skin lesion 01/28/2017   GERD (gastroesophageal reflux disease)    occasional   Hyperlipidemia    Hypertension    Hypertensive heart disease    a. Echocardiogram 11/19/11: Difficult acoustic windows, EF 60-65%, normal LV wall thickness, grade 1 diastolic dysfunction   Hypoparathyroidism ([facility])    Low zinc level 11/26/2016   Multinodular thyroid    Goiter s/p thyroidectomy in 2007 with post-op  hypocalcemia and post-op hypothyroidism/hypoparathyroidism with hypocalcemia   Neck pain on right  side 07/13/2013   Nocturia 05/31/2013   Pain in right axilla 03/13/2016   Palpitations    a. 03/2012 - patient set up for event monitor but did not wear correctly -  she declined wearing a repeat monitor   Post-surgical hypothyroidism    Sun-damaged skin 12/05/2014   Tubular adenoma of colon 06/2011   Unspecified constipation 05/31/2013   Vertigo    Zinc deficiency 11/26/2015    Past Surgical History:  Procedure Laterality Date   CARDIAC CATHETERIZATION N/A 03/08/2016   Procedure: Left Heart Cath and Coronary Angiography;  Surgeon: Jettie Booze, MD;  Location: [place] [facility];  Service: Cardiovascular;  Laterality: N/A;   COLONOSCOPY  Aenomatous polyps   07/05/2011   COLONOSCOPY N/A 09/28/2014   Procedure: COLONOSCOPY;  Surgeon: Ladene Artist, MD;  Location: [facility];  Service: Endoscopy;  Laterality: N/A;   CORONARY ANGIOPLASTY WITH STENT PLACEMENT     LEFT HEART CATH AND CORONARY ANGIOGRAPHY N/A 12/09/2018   Procedure: LEFT HEART CATH AND CORONARY ANGIOGRAPHY;  Surgeon: Jettie Booze, MD;  Location: [place] [facility];  Service: Cardiovascular;  Laterality: N/A;   LEFT HEART CATHETERIZATION WITH CORONARY ANGIOGRAM N/A 07/17/2012   Procedure: LEFT HEART CATHETERIZATION WITH CORONARY ANGIOGRAM;  Surgeon: Hillary Bow, MD;  Location: [facility] CATH LAB;  Service: Cardiovascular;  Laterality: N/A;   PERCUTANEOUS CORONARY STENT INTERVENTION (PCI-S) N/A 11/19/2011   Procedure: PERCUTANEOUS CORONARY STENT INTERVENTION (PCI-S);  Surgeon: Hillary Bow, MD;  Location: [facility] CATH LAB;  Service: Cardiovascular;  Laterality: N/A;   SHOULDER ARTHROSCOPY W/ ROTATOR CUFF REPAIR     right   TOTAL THYROIDECTOMY  2007   GOITER    Family History  Problem Relation Age of Onset   Colon cancer Brother    Cancer Brother        COLON   Hypertension Father    Heart disease Father    Heart attack Father    Blindness Sister    Congestive Heart Failure Sister     Hypertension Sister    Healthy Daughter    Healthy Son    Thyroid disease Sister    Cancer Brother        multiple myelomas   Healthy Daughter    Healthy Daughter    Diabetes Neg Hx    Prostate cancer Neg Hx    Breast cancer Neg Hx     Social History   Socioeconomic History   Marital status: Married    Spouse name: Not on file   Number of children: 4   Years of education: 14   Highest education level: Not on file  Occupational History   Occupation: Press photographer person at [employer] resource strain: Not on file   Food insecurity    Worry: Not on file    Inability: Not on file   Transportation needs    Medical: Not on file    Non-medical: Not on file  Tobacco Use   Smoking status: Never Smoker   Smokeless tobacco: Never Used  Substance and Sexual Activity   Alcohol use: Yes    Comment: rare once per month   Drug use: No   Sexual activity: Not Currently  Lifestyle   Physical activity    Days per week: Not on file    Minutes per session: Not on file   Stress: Not on file  Relationships   Social connections    Talks on phone: Not on file    Gets together: Not on file    Attends religious service: Not on file    Active member of club or organization: Not on file    Attends meetings of clubs or organizations: Not on file    Relationship status: Not on file   Intimate partner violence    Fear of current or ex partner: Not on file    Emotionally abused: Not on file    Physically abused: Not on file    Forced sexual activity: Not on file  Other Topics Concern   Not on file  Social History Narrative   Lives at home alone.  Her son lives near her.   Right-handed.   1 cup coffee per day.    Outpatient Medications Prior to Visit  Medication Sig Dispense Refill   ALPRAZolam (XANAX) 0.25 MG tablet Take 1 tablet (0.25 mg total) by mouth 2 (two) times daily as needed for anxiety. 30 tablet 0   amLODipine (NORVASC) 10 MG  tablet Take 1 tablet (10 mg total) by mouth daily. 90 tablet 3   aspirin EC 81 MG tablet Take 81 mg by mouth daily.       B Complex-C (B-COMPLEX WITH VITAMIN C) tablet Take 1 tablet by mouth daily.     bisoprolol (ZEBETA) 5 MG tablet      calcitRIOL (ROCALTROL) 0.25 MCG capsule TAKE 3 CAPSULES(0.75 MCG) BY MOUTH DAILY 270 capsule 2   cholecalciferol (VITAMIN D) 1000 UNITS tablet Take 1,000 Units by mouth daily.  clopidogrel (PLAVIX) 75 MG tablet TAKE 1 TABLET(75 MG) BY MOUTH DAILY 90 tablet 2   Evolocumab (REPATHA SURECLICK) [id number] MG/ML SOAJ Inject 1 pen into the skin every 14 (fourteen) days. 2 pen 11   isosorbide mononitrate (IMDUR) 60 MG 24 hr tablet Take 1 tablet (60 mg total) by mouth daily. 90 tablet 3   ketoconazole (NIZORAL) 2 % cream Apply 1 fingertip amount to each foot daily. 30 g 0   levothyroxine (SYNTHROID) 112 MCG tablet Take 1 tablet (112 mcg total) by mouth daily. 90 tablet 3   Multiple Vitamin (MULTIVITAMIN WITH MINERALS) TABS tablet Take 1 tablet by mouth daily.     rosuvastatin (CRESTOR) 40 MG tablet TAKE 1 TABLET(40 MG) BY MOUTH DAILY 90 tablet 2   traMADol (ULTRAM) 50 MG tablet tramadol 50 mg tablet  TK 1 T PO TID PRN     methylPREDNISolone (MEDROL) 4 MG tablet methylprednisolone 4 mg tablet     nitroGLYCERIN (NITROSTAT) 0.4 MG SL tablet Place 1 tablet (0.4 mg total) under the tongue every 5 (five) minutes as needed for chest pain. 60 tablet 3   pantoprazole (PROTONIX) 40 MG tablet Take 1 tablet (40 mg total) by mouth daily. 90 tablet 3   No facility-administered medications prior to visit.     Allergies  Allergen Reactions   Zetia [Ezetimibe] Other (See Comments)    Myalgia, paresthesias and weakness    Review of Systems  Constitutional: Negative for fever and malaise/fatigue.  HENT: Negative for congestion.   Eyes: Negative for blurred vision.  Respiratory: Negative for shortness of breath.   Cardiovascular: Negative for chest pain,  palpitations and leg swelling.  Gastrointestinal: Negative for abdominal pain, blood in stool and nausea.  Genitourinary: Negative for dysuria and frequency.  Musculoskeletal: Negative for falls and joint pain.  Skin: Negative for rash.  Neurological: Negative for dizziness, loss of consciousness and headaches.  Endo/Heme/Allergies: Negative for environmental allergies.  Psychiatric/Behavioral: Negative for depression. The patient is not nervous/anxious.        Objective:    Physical Exam Vitals signs and nursing note reviewed.  Constitutional:      General: She is not in acute distress.    Appearance: She is well-developed.  HENT:     Head: Normocephalic and atraumatic.     Nose: Nose normal.  Eyes:     General:        Right eye: No discharge.        Left eye: No discharge.  Neck:     Musculoskeletal: Normal range of motion and neck supple.  Cardiovascular:     Rate and Rhythm: Normal rate and regular rhythm.     Heart sounds: No murmur.  Pulmonary:     Effort: Pulmonary effort is normal.     Breath sounds: Normal breath sounds.  Abdominal:     General: Bowel sounds are normal.     Palpations: Abdomen is soft.     Tenderness: There is no abdominal tenderness.  Musculoskeletal:     Comments: Left breast exam no lesions, skin changes or discharge  Skin:    General: Skin is warm and dry.  Neurological:     Mental Status: She is alert and oriented to person, place, and time.     BP 120/60 (BP Location: Left Arm, Patient Position: Sitting, Cuff Size: Normal)    Pulse (!) 57    Temp (!) 97.4 F (36.3 C) (Temporal)    Resp 18    Wt 202 lb  3.2 oz (91.7 kg)    SpO2 97%    BMI 34.71 kg/m  Wt Readings from Last 3 Encounters:  11/09/19 202 lb 3.2 oz (91.7 kg)  10/23/19 204 lb (92.5 kg)  08/20/19 199 lb 9.6 oz (90.5 kg)    Diabetic Foot Exam - Simple   No data filed     Lab Results  Component Value Date   WBC 8.1 04/23/2019   HGB 13.7 04/23/2019   HCT 40.6  04/23/2019   PLT 256.0 04/23/2019   GLUCOSE 89 10/12/2019   CHOL 102 10/12/2019   TRIG 224.0 (H) 10/12/2019   HDL 38.90 (L) 10/12/2019   LDLDIRECT 33.0 10/12/2019   LDLCALC 62 04/23/2019   ALT 11 04/23/2019   AST 13 04/23/2019   NA 143 10/12/2019   K 4.4 10/12/2019   CL 103 10/12/2019   CREATININE 1.14 10/12/2019   BUN 25 (H) 10/12/2019   CO2 32 10/12/2019   TSH 5.82 (H) 10/12/2019   INR 0.86 03/07/2016   HGBA1C 5.9 10/12/2019    Lab Results  Component Value Date   TSH 5.82 (H) 10/12/2019   Lab Results  Component Value Date   WBC 8.1 04/23/2019   HGB 13.7 04/23/2019   HCT 40.6 04/23/2019   MCV 87.2 04/23/2019   PLT 256.0 04/23/2019   Lab Results  Component Value Date   NA 143 10/12/2019   K 4.4 10/12/2019   CO2 32 10/12/2019   GLUCOSE 89 10/12/2019   BUN 25 (H) 10/12/2019   CREATININE 1.14 10/12/2019   BILITOT 0.4 04/23/2019   ALKPHOS 69 04/23/2019   AST 13 04/23/2019   ALT 11 04/23/2019   PROT 7.1 04/23/2019   ALBUMIN 4.1 04/23/2019   CALCIUM 9.2 10/12/2019   ANIONGAP 7 02/25/2019   GFR 46.77 (L) 10/12/2019   Lab Results  Component Value Date   CHOL 102 10/12/2019   Lab Results  Component Value Date   HDL 38.90 (L) 10/12/2019   Lab Results  Component Value Date   LDLCALC 62 04/23/2019   Lab Results  Component Value Date   TRIG 224.0 (H) 10/12/2019   Lab Results  Component Value Date   CHOLHDL 3 10/12/2019   Lab Results  Component Value Date   HGBA1C 5.9 10/12/2019       Assessment & Plan:   Problem List Items Addressed This Visit    Hypothyroidism    On Levothyroxine, continue to monitor      Hyperlipidemia    Tolerating statin, encouraged heart healthy diet, avoid trans fats, minimize simple carbs and saturated fats. Increase exercise as tolerated      Relevant Medications   nitroGLYCERIN (NITROSTAT) 0.4 MG SL tablet   Essential hypertension, benign    Well controlled, no changes to meds. Encouraged heart healthy diet such  as the DASH diet and exercise as tolerated.       Relevant Medications   nitroGLYCERIN (NITROSTAT) 0.4 MG SL tablet   Other Relevant Orders   CMP   CBC   Hypocalcemia    Check cmp and vitamin d      Zinc deficiency    Supplement and monitor      Relevant Orders   Zinc   Chest pain   Relevant Medications   nitroGLYCERIN (NITROSTAT) 0.4 MG SL tablet   Breast pain, left - Primary    She had a normal mammogram but she has had a discomfort on palpation in same spot for about a month and a 1/2.  It is improving and she thinks it began after her flu shot. Will proceed with ultrasound to confirm no concerns      Relevant Orders   US BREAST LTD UNI LEFT INC AXILLA   Arm lesion    Small, nontender nodule noted on ulnar aspect of lower arm possible cyst. Will proceed with ultrasound as patient is worried       Relevant Orders   [place] RT UPPER EXTREM LTD SOFT TISSUE NON VASCULAR   SED RATE    Other Visit Diagnoses    Gastroesophageal reflux disease with esophagitis       Relevant Medications   pantoprazole (PROTONIX) 40 MG tablet   Vitamin D deficiency       Relevant Orders   VITAMIN D      I have discontinued Mykhia T. Simmer's methylPREDNISolone. I am also having her maintain her aspirin EC, B-complex with vitamin C, cholecalciferol, multivitamin with minerals, ketoconazole, ALPRAZolam, rosuvastatin, clopidogrel, Evolocumab, calcitRIOL, isosorbide mononitrate, bisoprolol, traMADol, levothyroxine, amLODipine, nitroGLYCERIN, and pantoprazole.  Meds ordered this encounter  Medications   nitroGLYCERIN (NITROSTAT) 0.4 MG SL tablet    Sig: Place 1 tablet (0.4 mg total) under the tongue every 5 (five) minutes as needed for chest pain.    Dispense:  60 tablet    Refill:  3   pantoprazole (PROTONIX) 40 MG tablet    Sig: Take 1 tablet (40 mg total) by mouth daily.    Dispense:  90 tablet    Refill:  3     Penni Homans, MD

## 2019-11-09 NOTE — Assessment & Plan Note (Signed)
On Levothyroxine, continue to monitor 

## 2019-11-09 NOTE — Assessment & Plan Note (Signed)
Small, nontender nodule noted on ulnar aspect of lower arm possible cyst. Will proceed with ultrasound as patient is worried

## 2019-11-09 NOTE — Addendum Note (Signed)
Addended by: Kelle Darting A on: 11/09/2019 12:10 PM   Modules accepted: Orders

## 2019-11-09 NOTE — Patient Instructions (Signed)

## 2019-11-09 NOTE — Assessment & Plan Note (Signed)
Check cmp and vitamin d

## 2019-11-09 NOTE — Assessment & Plan Note (Signed)
Tolerating statin, encouraged heart healthy diet, avoid trans fats, minimize simple carbs and saturated fats. Increase exercise as tolerated 

## 2019-11-09 NOTE — Assessment & Plan Note (Signed)
Supplement and monitor 

## 2019-11-09 NOTE — Telephone Encounter (Signed)
Not urgent can get Zinc with next visit as long as we were able to get the Vitamin D

## 2019-11-10 ENCOUNTER — Other Ambulatory Visit: Payer: Self-pay | Admitting: Family Medicine

## 2019-11-10 DIAGNOSIS — N644 Mastodynia: Secondary | ICD-10-CM

## 2019-11-17 ENCOUNTER — Telehealth: Payer: Self-pay | Admitting: Family Medicine

## 2019-11-17 NOTE — Telephone Encounter (Signed)
Copied from [place] [id number]. Topic: General - Other >> Nov 17, 2019 11:20 AM Keene Breath wrote: Reason for CRM: Patient would like a call from the nurse regarding her blood test results and an ultrasound test.  Please advise and call patient to discuss at [phone]

## 2019-11-18 NOTE — Telephone Encounter (Signed)
Patient is call again because she still has not gotten a call about her test results.  Please call patient as soon as possible.

## 2019-11-18 NOTE — Telephone Encounter (Signed)
Called left message to call back. Most recent lab results were mailed to her on 11/10/2019.

## 2019-11-23 ENCOUNTER — Ambulatory Visit: Payer: Medicare Other | Admitting: Family Medicine

## 2019-11-24 ENCOUNTER — Ambulatory Visit
Admission: RE | Admit: 2019-11-24 | Discharge: 2019-11-24 | Disposition: A | Discharge status: Home / Self Care | Payer: Medicare Other | Source: Ambulatory Visit | Attending: Family Medicine | Admitting: Family Medicine

## 2019-11-24 DIAGNOSIS — L989 Disorder of the skin and subcutaneous tissue, unspecified: Secondary | ICD-10-CM

## 2019-11-24 DIAGNOSIS — R2231 Localized swelling, mass and lump, right upper limb: Secondary | ICD-10-CM | POA: Diagnosis not present

## 2019-11-25 ENCOUNTER — Ambulatory Visit
Admission: RE | Admit: 2019-11-25 | Discharge: 2019-11-25 | Disposition: A | Discharge status: Home / Self Care | Payer: Medicare Other | Source: Ambulatory Visit | Attending: Family Medicine | Admitting: Family Medicine

## 2019-11-25 ENCOUNTER — Other Ambulatory Visit: Payer: Self-pay

## 2019-11-25 DIAGNOSIS — N644 Mastodynia: Secondary | ICD-10-CM

## 2019-11-25 DIAGNOSIS — R928 Other abnormal and inconclusive findings on diagnostic imaging of breast: Secondary | ICD-10-CM | POA: Diagnosis not present

## 2019-11-26 ENCOUNTER — Ambulatory Visit: Payer: Medicare Other | Admitting: Family Medicine

## 2019-12-07 ENCOUNTER — Telehealth: Payer: Self-pay

## 2019-12-07 NOTE — Telephone Encounter (Signed)
Copied from [place] [id number]. Topic: General - Other >> Dec 07, 2019  9:33 AM Nils Flack wrote: Reason for CRM: pt needs a call back about ultrasound results  Please call [phone]   Can you result her [place] results  Please advise

## 2019-12-07 NOTE — Telephone Encounter (Signed)
I sent her a note about her lower extremity ultrasound. Do not know how to release again, it was negative

## 2019-12-08 NOTE — Telephone Encounter (Signed)
Called patient left voicemail for patient to call the office back

## 2019-12-10 NOTE — Telephone Encounter (Signed)
Unable to leave message for patient to return call back for [place] results, phone kept ringing. P[facility] may give results and obtain information.

## 2019-12-22 ENCOUNTER — Encounter: Payer: Self-pay | Admitting: Nurse Practitioner

## 2019-12-31 ENCOUNTER — Ambulatory Visit: Payer: Medicare Other | Admitting: Rheumatology

## 2020-01-01 ENCOUNTER — Telehealth: Payer: Self-pay | Admitting: *Deleted

## 2020-01-01 NOTE — Telephone Encounter (Signed)
Let the patient know she is up-to-date on pneumonia shots

## 2020-01-01 NOTE — Telephone Encounter (Signed)
Notified pt. She asked about scheduling the COVID 19 vaccine. Gave pt number to the health department for further recommendations / scheduling.

## 2020-01-01 NOTE — Telephone Encounter (Signed)
Copied from [place] [id number]. Topic: General - Inquiry >> Dec 31, 2019  4:56 PM Greggory Keen D wrote: Reason for CRM: pt is calling wanting to know if she needs to take pneumonia vaccine and if so please cll her back.  CB#  [phone]

## 2020-01-08 ENCOUNTER — Encounter: Payer: Self-pay | Admitting: Nurse Practitioner

## 2020-01-08 ENCOUNTER — Ambulatory Visit (INDEPENDENT_AMBULATORY_CARE_PROVIDER_SITE_OTHER): Payer: Medicare Other | Admitting: Podiatry

## 2020-01-08 ENCOUNTER — Ambulatory Visit (INDEPENDENT_AMBULATORY_CARE_PROVIDER_SITE_OTHER): Payer: Medicare Other | Admitting: Nurse Practitioner

## 2020-01-08 ENCOUNTER — Other Ambulatory Visit: Payer: Self-pay

## 2020-01-08 ENCOUNTER — Encounter: Payer: Self-pay | Admitting: *Deleted

## 2020-01-08 ENCOUNTER — Telehealth: Payer: Self-pay

## 2020-01-08 VITALS — BP 124/62 | HR 57 | Temp 97.6°F | Ht 64.0 in | Wt 202.1 lb

## 2020-01-08 VITALS — Temp 97.3°F

## 2020-01-08 DIAGNOSIS — Z800 Family history of malignant neoplasm of digestive organs: Secondary | ICD-10-CM | POA: Diagnosis not present

## 2020-01-08 DIAGNOSIS — Z01818 Encounter for other preprocedural examination: Secondary | ICD-10-CM | POA: Diagnosis not present

## 2020-01-08 DIAGNOSIS — Z86010 Personal history of colonic polyps: Secondary | ICD-10-CM

## 2020-01-08 DIAGNOSIS — E1169 Type 2 diabetes mellitus with other specified complication: Secondary | ICD-10-CM

## 2020-01-08 DIAGNOSIS — Z1159 Encounter for screening for other viral diseases: Secondary | ICD-10-CM | POA: Diagnosis not present

## 2020-01-08 DIAGNOSIS — B351 Tinea unguium: Secondary | ICD-10-CM

## 2020-01-08 DIAGNOSIS — E1151 Type 2 diabetes mellitus with diabetic peripheral angiopathy without gangrene: Secondary | ICD-10-CM

## 2020-01-08 MED ORDER — PEG-KCL-NACL-NASULF-NA ASC-C 100 G PO SOLR: 1.0000 | Freq: Once | ORAL | 0 refills | Status: AC

## 2020-01-08 NOTE — Progress Notes (Signed)
Reviewed and agree with management plan.  Kanon Colunga T. Donevin Sainsbury, MD FACG [facility]  

## 2020-01-08 NOTE — Telephone Encounter (Signed)
[facility] Pre-operative Risk Assessment     Request for surgical clearance:     Endoscopy Procedure  What type of surgery is being performed?     Colonoscopy  When is this surgery scheduled?     01/13/20  What type of clearance is required ?   Pharmacy  Are there any medications that need to be held prior to surgery and how long? HOLD PLAVIX FIVE DAYS PRIOR   Practice name and name of physician performing surgery?      [facility]/Dr. Fuller Plan  What is your office phone and fax number?      Phone- [phone]  Fax- [phone] Attn: Peter Congo, RMA  Anesthesia type (None, local, MAC, general) ?       MAC ATTN: DR. Harrington Challenger

## 2020-01-08 NOTE — Telephone Encounter (Signed)
This encounter was created in error - please disregard.

## 2020-01-08 NOTE — Telephone Encounter (Signed)
Julaine Hua, CMA: I am just wanting to reach out to you as you I know you sne back urgent needc to know before 5 pm today, which would have to do with holding medications. So we are actually waiting on the MD Dr. Dorris Carnes pt's cardiologist. Dr. Harrington Challenger is in clinic today and most likely has not had time to review messages. We do not want to delay the pt's procedure however we do need Dr. Alan Ripper input as well.   Thurmon Fair, RMA: when the patient was here this morning she had not taken her plavix. If i do not get a response by 5 can Dr. Harrington Challenger contact the patient and let her know if okay to continue to hold for procedure? Thanks Peter Congo

## 2020-01-08 NOTE — Progress Notes (Signed)
  Subjective:  Patient ID: [name], adult    DOB: [birth year],  MRN: [id number]  Chief Complaint  Patient presents with  . Routine Foot Care    No new complaints.    74 y.o. adult presents with the above complaint. History confirmed with patient.  Objective:  Physical Exam: warm, good capillary refill, nail exam onychomycosis of the toenails, no trophic changes or ulcerative lesions, normal DP, non-palp PT pulses, and normal sensory exam. Left Foot: normal exam, no swelling, tenderness, instability; ligaments intact, full range of motion of all ankle/foot joints  Right Foot: normal exam, no swelling, tenderness, instability; ligaments intact, full range of motion of all ankle/foot joints   No images are attached to the encounter.  Assessment:   1. Onychomycosis of multiple toenails with type 2 diabetes mellitus and peripheral angiopathy ([place])    Plan:  Patient was evaluated and treated and all questions answered.  Onychomycosis, Diabetes and PAD -Patient is diabetic with a qualifying condition for at risk foot care.  Procedure: Nail Debridement Rationale: Patient meets criteria for routine foot care due to PAD Type of Debridement: manual, sharp debridement. Instrumentation: Nail nipper, rotary burr. Number of Nails: 10  Return in about 3 months (around 04/07/2020) for Diabetic Foot Care.

## 2020-01-08 NOTE — Progress Notes (Signed)
ASSESSMENT / PLAN:  74 year old female with history of CAD, hyperlipidemia, hypertension, goiter, esophagitis  1.  Family history of colon cancer and sibling under the age of 60/personal history of adenomatous colon polyps.  Due for surveillance colonoscopy.  -The risks and benefits of colonoscopy with possible polypectomy / biopsies were discussed and the patient agrees to proceed.   2. CAD / hx of PCI, on chronic Plavix. Last Echo Sept 2020 - normal LV systolic function, EF 0000000. No chest pain / SOB.  --Hold Plavix for 5 days before procedure - will instruct when and how to resume after procedure. Patient understands that there is a low but real risk of cardiovascular event such as heart attack, stroke, or embolism /  thrombosis, or ischemia while off Plavix. The patient consents to proceed. Will communicate by phone or EMR with patient's prescribing provider to confirm that holding Plavix is reasonable in this case.   3. HTN, controlled on medications.   HPI:          Primary GI:  Lucio Edward, MD  Chief Complaint:   Time for colonoscopy  Patient is a 74 year old female from [place].  She has a family history colon cancer and personal history of adenomatous colon polyps.  She is due for surveillance colonoscopy.  Patient is on Plavix for history of CAD/PCI, last one in 2013.  She has no chest pain or shortness of breath.  Patient is followed by Dr. Dorris Carnes.  She offers no GI symptoms such as bowel changes, blood in stool nor abdominal pain.  Data Reviewed:  Labs 07/09/2019 BUN 31, creatinine 1.13, GFR 47 CBC normal  Past Medical History:  Diagnosis Date  . Acute MI, subendocardial ([place])   . Arthritis   . Back pain 05/31/2013  . Benign paroxysmal positional vertigo 08/05/2016  . CAD (coronary artery disease)    a. NSTEMI 10/2011: [place] 11/19/11: pLAD 30%, oOM 60%, AVCFX 30%, CFX after OM2 30%, pRCA 60 and 70%, then 99%, AM 80-90% with TIMI 3 flow.  PCI:  Promus DES x 2 to RCA; b. 06/2012 Cath: patent RCA stents w/ subtl occl of Acute Marginal (jailed)->Med rx; c. 05/2015 MV: EF 59%, mod mid infsept/inf/ap lat/ap infarct with peri-infarct isch-->Med Rx; d. 02/2016 Cath: LM nl, LAD 38m, RI 50, RCA patent stents.  . Colitis   . Facial skin lesion 01/28/2017  . GERD (gastroesophageal reflux disease)    occasional  . Hyperlipidemia   . Hypertension   . Hypertensive heart disease    a. Echocardiogram 11/19/11: Difficult acoustic windows, EF 60-65%, normal LV wall thickness, grade 1 diastolic dysfunction  . Hypoparathyroidism ([place])   . Low zinc level 11/26/2016  . Multinodular thyroid    Goiter s/p thyroidectomy in 2007 with post-op  hypocalcemia and post-op hypothyroidism/hypoparathyroidism with hypocalcemia  . Palpitations    a. 03/2012 - patient set up for event monitor but did not wear correctly -  she declined wearing a repeat monitor  . Post-surgical hypothyroidism   . Tubular adenoma of colon 06/2011  . Unspecified constipation 05/31/2013  . Vertigo   . Zinc deficiency 11/26/2015     Past Surgical History:  Procedure Laterality Date  . CARDIAC CATHETERIZATION N/A 03/08/2016   Procedure: Left Heart Cath and Coronary Angiography;  Surgeon: Jettie Booze, MD;  Location: [facility];  Service: Cardiovascular;  Laterality: N/A;  . COLONOSCOPY  Aenomatous polyps  07/05/2011  . COLONOSCOPY N/A 09/28/2014   Procedure: COLONOSCOPY;  Surgeon: Ladene Artist, MD;  Location: [facility];  Service: Endoscopy;  Laterality: N/A;  . CORONARY ANGIOPLASTY WITH STENT PLACEMENT    . LEFT HEART CATH AND CORONARY ANGIOGRAPHY N/A 12/09/2018   Procedure: LEFT HEART CATH AND CORONARY ANGIOGRAPHY;  Surgeon: Jettie Booze, MD;  Location: [facility];  Service: Cardiovascular;  Laterality: N/A;  . LEFT HEART CATHETERIZATION WITH CORONARY ANGIOGRAM N/A 07/17/2012   Procedure: LEFT HEART CATHETERIZATION WITH CORONARY ANGIOGRAM;  Surgeon: Hillary Bow, MD;  Location: [facility];  Service: Cardiovascular;  Laterality: N/A;  . PERCUTANEOUS CORONARY STENT INTERVENTION (PCI-S) N/A 11/19/2011   Procedure: PERCUTANEOUS CORONARY STENT INTERVENTION (PCI-S);  Surgeon: Hillary Bow, MD;  Location: [facility];  Service: Cardiovascular;  Laterality: N/A;  . SHOULDER ARTHROSCOPY W/ ROTATOR CUFF REPAIR     right  . TOTAL THYROIDECTOMY  2007   GOITER   Family History  Problem Relation Age of Onset  . Colon cancer Brother   . Cancer Brother        COLON  . Hypertension Father   . Heart disease Father   . Heart attack Father   . Blindness Sister   . Congestive Heart Failure Sister   . Hypertension Sister   . Healthy Daughter   . Healthy Son   . Thyroid disease Sister   . Cancer Brother        multiple myelomas  . Healthy Daughter   . Healthy Daughter   . Diabetes Neg Hx   . Prostate cancer Neg Hx   . Breast cancer Neg Hx    Social History   Tobacco Use  . Smoking status: Never Smoker  . Smokeless tobacco: Never Used  Substance Use Topics  . Alcohol use: Yes    Comment: rare once per month  . Drug use: No   Current Outpatient Medications  Medication Sig Dispense Refill  . ALPRAZolam (XANAX) 0.25 MG tablet Take 1 tablet (0.25 mg total) by mouth 2 (two) times daily as needed for anxiety. 30 tablet 0  . amLODipine (NORVASC) 10 MG tablet Take 1 tablet (10 mg total) by mouth daily. 90 tablet 3  . aspirin EC 81 MG tablet Take 81 mg by mouth daily.      . B Complex-C (B-COMPLEX WITH VITAMIN C) tablet Take 1 tablet by mouth daily.    . bisoprolol (ZEBETA) 5 MG tablet Take 5 mg by mouth daily.     . calcitRIOL (ROCALTROL) 0.25 MCG capsule TAKE 3 CAPSULES(0.75 MCG) BY MOUTH DAILY 270 capsule 2  . cholecalciferol (VITAMIN D) 1000 UNITS tablet Take 1,000 Units by mouth daily.    . clopidogrel (PLAVIX) 75 MG tablet TAKE 1 TABLET(75 MG) BY MOUTH DAILY 90 tablet 2  . Evolocumab (REPATHA SURECLICK) [id number] MG/ML SOAJ Inject 1 pen into  the skin every 14 (fourteen) days. 2 pen 11  . isosorbide mononitrate (IMDUR) 60 MG 24 hr tablet Take 1 tablet (60 mg total) by mouth daily. 90 tablet 3  . ketoconazole (NIZORAL) 2 % cream Apply 1 fingertip amount to each foot daily. 30 g 0  . levothyroxine (SYNTHROID) 112 MCG tablet Take 1 tablet (112 mcg total) by mouth daily. 90 tablet 3  . Multiple Vitamin (MULTIVITAMIN WITH MINERALS) TABS tablet Take 1 tablet by mouth daily.    . nitroGLYCERIN (NITROSTAT) 0.4 MG SL tablet Place 1 tablet (0.4 mg total) under the tongue every 5 (  five) minutes as needed for chest pain. 60 tablet 3  . pantoprazole (PROTONIX) 40 MG tablet Take 1 tablet (40 mg total) by mouth daily. 90 tablet 3  . rosuvastatin (CRESTOR) 40 MG tablet TAKE 1 TABLET(40 MG) BY MOUTH DAILY 90 tablet 2  . traMADol (ULTRAM) 50 MG tablet tramadol 50 mg tablet  TK 1 T PO TID PRN     No current facility-administered medications for this visit.   Allergies  Allergen Reactions  . Zetia [Ezetimibe] Other (See Comments)    Myalgia, paresthesias and weakness     Review of Systems: All systems reviewed and negative except where noted in HPI.   Physical Exam:    Wt Readings from Last 3 Encounters:  01/08/20 202 lb 2 oz (91.7 kg)  11/09/19 202 lb 3.2 oz (91.7 kg)  10/23/19 204 lb (92.5 kg)    BP 124/62 (BP Location: Right Arm, Patient Position: Sitting, Cuff Size: Normal)   Pulse (!) 57   Temp 97.6 F (36.4 C)   Ht 5\' 4"  (1.626 m)   Wt 202 lb 2 oz (91.7 kg)   SpO2 97%   BMI 34.69 kg/m  Constitutional:  Pleasant female in no acute distress. Psychiatric: Normal mood and affect. Behavior is normal. EENT: Pupils normal.  Conjunctivae are normal. No scleral icterus. Neck supple.  Cardiovascular: Normal rate, regular rhythm. No edema Pulmonary/chest: Effort normal and breath sounds normal. No wheezing, rales or rhonchi. Abdominal: Soft, nondistended, nontender. Bowel sounds active throughout. There are no masses palpable. No  hepatomegaly. Neurological: Alert and oriented to person place and time. Skin: Skin is warm and dry. No rashes noted.  Tye Savoy, NP  01/08/2020, 9:09 AM

## 2020-01-08 NOTE — Progress Notes (Deleted)
Subjective:   [name] is a 74 y.o. female who presents for Medicare Annual (Subsequent) preventive examination.  Review of Systems:    Home Safety/Smoke Alarms: Feels safe in home. Smoke alarms in place.  Lives in 1 story home w/ ex-husband. Step over tub w/ grab rail.  Female:   Mammo- 11/25/19      Dexa scan- 02/03/19       CCS- last 2015. Has appt w/ Dr.Stark 01/13/20.    Objective:     Vitals: There were no vitals taken for this visit.  There is no height or weight on file to calculate BMI.  Advanced Directives 02/25/2019 01/08/2019 12/09/2018 11/17/2018 03/06/2018 03/25/2017 06/28/2016  Does Patient Have a Medical Advance Directive? No No No No No No No  Would patient like information on creating a medical advance directive? - No - Patient declined No - Patient declined No - Patient declined No - Patient declined No - Patient declined Yes - Educational materials given  Pre-existing out of facility DNR order (yellow form or pink MOST form) - - - - - - -    Tobacco Social History   Tobacco Use  Smoking Status Never Smoker  Smokeless Tobacco Never Used     Counseling given: Not Answered   Clinical Intake:                       Past Medical History:  Diagnosis Date  . Acute MI, subendocardial ([place])   . Arthritis   . Back pain 05/31/2013  . Benign paroxysmal positional vertigo 08/05/2016  . CAD (coronary artery disease)    a. NSTEMI 10/2011: [facility] 11/19/11: pLAD 30%, oOM 60%, AVCFX 30%, CFX after OM2 30%, pRCA 60 and 70%, then 99%, AM 80-90% with TIMI 3 flow.  PCI: Promus DES x 2 to RCA; b. 06/2012 Cath: patent RCA stents w/ subtl occl of Acute Marginal (jailed)->Med rx; c. 05/2015 MV: EF 59%, mod mid infsept/inf/ap lat/ap infarct with peri-infarct isch-->Med Rx; d. 02/2016 Cath: LM nl, LAD 83m, RI 50, RCA patent stents.  . Colitis   . Facial skin lesion 01/28/2017  . GERD (gastroesophageal reflux disease)    occasional  . Hyperlipidemia   . Hypertension   .  Hypertensive heart disease    a. Echocardiogram 11/19/11: Difficult acoustic windows, EF 60-65%, normal LV wall thickness, grade 1 diastolic dysfunction  . Hypoparathyroidism ([place])   . Low zinc level 11/26/2016  . Multinodular thyroid    Goiter s/p thyroidectomy in 2007 with post-op  hypocalcemia and post-op hypothyroidism/hypoparathyroidism with hypocalcemia  . Neck pain on right side 07/13/2013  . Nocturia 05/31/2013  . Pain in right axilla 03/13/2016  . Palpitations    a. 03/2012 - patient set up for event monitor but did not wear correctly -  she declined wearing a repeat monitor  . Post-surgical hypothyroidism   . Sun-damaged skin 12/05/2014  . Tubular adenoma of colon 06/2011  . Unspecified constipation 05/31/2013  . Vertigo   . Zinc deficiency 11/26/2015   Past Surgical History:  Procedure Laterality Date  . CARDIAC CATHETERIZATION N/A 03/08/2016   Procedure: Left Heart Cath and Coronary Angiography;  Surgeon: Jettie Booze, MD;  Location: [facility];  Service: Cardiovascular;  Laterality: N/A;  . COLONOSCOPY  Aenomatous polyps   07/05/2011  . COLONOSCOPY N/A 09/28/2014   Procedure: COLONOSCOPY;  Surgeon: Ladene Artist, MD;  Location: [facility];  Service: Endoscopy;  Laterality: N/A;  . CORONARY  ANGIOPLASTY WITH STENT PLACEMENT    . LEFT HEART CATH AND CORONARY ANGIOGRAPHY N/A 12/09/2018   Procedure: LEFT HEART CATH AND CORONARY ANGIOGRAPHY;  Surgeon: Jettie Booze, MD;  Location: [facility];  Service: Cardiovascular;  Laterality: N/A;  . LEFT HEART CATHETERIZATION WITH CORONARY ANGIOGRAM N/A 07/17/2012   Procedure: LEFT HEART CATHETERIZATION WITH CORONARY ANGIOGRAM;  Surgeon: Hillary Bow, MD;  Location: [facility] CATH LAB;  Service: Cardiovascular;  Laterality: N/A;  . PERCUTANEOUS CORONARY STENT INTERVENTION (PCI-S) N/A 11/19/2011   Procedure: PERCUTANEOUS CORONARY STENT INTERVENTION (PCI-S);  Surgeon: Hillary Bow, MD;  Location: [facility] CATH LAB;  Service:  Cardiovascular;  Laterality: N/A;  . SHOULDER ARTHROSCOPY W/ ROTATOR CUFF REPAIR     right  . TOTAL THYROIDECTOMY  2007   GOITER   Family History  Problem Relation Age of Onset  . Colon cancer Brother   . Cancer Brother        COLON  . Hypertension Father   . Heart disease Father   . Heart attack Father   . Blindness Sister   . Congestive Heart Failure Sister   . Hypertension Sister   . Healthy Daughter   . Healthy Son   . Thyroid disease Sister   . Cancer Brother        multiple myelomas  . Healthy Daughter   . Healthy Daughter   . Diabetes Neg Hx   . Prostate cancer Neg Hx   . Breast cancer Neg Hx    Social History   Socioeconomic History  . Marital status: Married    Spouse name: Not on file  . Number of children: 4  . Years of education: 46  . Highest education level: Not on file  Occupational History  . Occupation: Sales person at [employer]  . Smoking status: Never Smoker  . Smokeless tobacco: Never Used  Substance and Sexual Activity  . Alcohol use: Yes    Comment: rare once per month  . Drug use: No  . Sexual activity: Not Currently  Other Topics Concern  . Not on file  Social History Narrative   Lives at home alone.  Her son lives near her.   Right-handed.   1 cup coffee per day.   Social Determinants of Health   Financial Resource Strain:   . Difficulty of Paying Living Expenses: Not on file  Food Insecurity:   . Worried About Charity fundraiser in the Last Year: Not on file  . Ran Out of Food in the Last Year: Not on file  Transportation Needs:   . Lack of Transportation (Medical): Not on file  . Lack of Transportation (Non-Medical): Not on file  Physical Activity:   . Days of Exercise per Week: Not on file  . Minutes of Exercise per Session: Not on file  Stress:   . Feeling of Stress : Not on file  Social Connections:   . Frequency of Communication with Friends and Family: Not on file  . Frequency of Social Gatherings with  Friends and Family: Not on file  . Attends Religious Services: Not on file  . Active Member of Clubs or Organizations: Not on file  . Attends Archivist Meetings: Not on file  . Marital Status: Not on file    Outpatient Encounter Medications as of 01/11/2020  Medication Sig  . ALPRAZolam (XANAX) 0.25 MG tablet Take 1 tablet (0.25 mg total) by mouth 2 (two) times daily as needed for anxiety.  Marland Kitchen  amLODipine (NORVASC) 10 MG tablet Take 1 tablet (10 mg total) by mouth daily.  Marland Kitchen aspirin EC 81 MG tablet Take 81 mg by mouth daily.    . B Complex-C (B-COMPLEX WITH VITAMIN C) tablet Take 1 tablet by mouth daily.  . bisoprolol (ZEBETA) 5 MG tablet Take 5 mg by mouth daily.   . calcitRIOL (ROCALTROL) 0.25 MCG capsule TAKE 3 CAPSULES(0.75 MCG) BY MOUTH DAILY  . cholecalciferol (VITAMIN D) 1000 UNITS tablet Take 1,000 Units by mouth daily.  . clopidogrel (PLAVIX) 75 MG tablet TAKE 1 TABLET(75 MG) BY MOUTH DAILY  . Evolocumab (REPATHA SURECLICK) XX123456 MG/ML SOAJ Inject 1 pen into the skin every 14 (fourteen) days.  . isosorbide mononitrate (IMDUR) 60 MG 24 hr tablet Take 1 tablet (60 mg total) by mouth daily.  Marland Kitchen ketoconazole (NIZORAL) 2 % cream Apply 1 fingertip amount to each foot daily.  Marland Kitchen levothyroxine (SYNTHROID) 112 MCG tablet Take 1 tablet (112 mcg total) by mouth daily.  . Multiple Vitamin (MULTIVITAMIN WITH MINERALS) TABS tablet Take 1 tablet by mouth daily.  . nitroGLYCERIN (NITROSTAT) 0.4 MG SL tablet Place 1 tablet (0.4 mg total) under the tongue every 5 (five) minutes as needed for chest pain.  . pantoprazole (PROTONIX) 40 MG tablet Take 1 tablet (40 mg total) by mouth daily.  . rosuvastatin (CRESTOR) 40 MG tablet TAKE 1 TABLET(40 MG) BY MOUTH DAILY  . traMADol (ULTRAM) 50 MG tablet tramadol 50 mg tablet  TK 1 T PO TID PRN   No facility-administered encounter medications on file as of 01/11/2020.    Activities of Daily Living In your present state of health, do you have any  difficulty performing the following activities: 01/08/2019  Hearing? Y  Comment pt reports she sees Dr. Wilburn Cornelia  Vision? N  Comment wears glasses.  Difficulty concentrating or making decisions? N  Comment reads news on phone.  Walking or climbing stairs? Y  Dressing or bathing? N  Doing errands, shopping? N  Preparing Food and eating ? N  Using the Toilet? N  In the past six months, have you accidently leaked urine? N  Do you have problems with loss of bowel control? N  Managing your Medications? N  Managing your Finances? N  Housekeeping or managing your Housekeeping? N  Some recent data might be hidden    Patient Care Team: Mosie Lukes, MD as PCP - General (Family Medicine) Fay Records, MD as PCP - Cardiology (Cardiology) Ladene Artist, MD as Consulting Physician (Gastroenterology) Fay Records, MD as Consulting Physician (Cardiology) Suella Broad, MD as Consulting Physician (Physical Medicine and Rehabilitation)    Assessment:   This is a routine wellness examination for Sheray. Physical assessment deferred to PCP.  Exercise Activities and Dietary recommendations   Diet (meal preparation, eat out, water intake, caffeinated beverages, dairy products, fruits and vegetables): {Desc; diets:16563} Breakfast: Lunch:  Dinner:      Goals    . Garden again       Fall Risk Fall Risk  01/08/2019 03/14/2018 05/07/2016 03/07/2015  Falls in the past year? 1 No No No  Number falls in past yr: 0 - - -  Injury with Fall? 0 - - -   Depression Screen PHQ 2/9 Scores 01/08/2019 07/26/2016 05/07/2016 03/07/2015  PHQ - 2 Score 0 0 0 0  PHQ- 9 Score - 0 - -     Cognitive Function Ad8 score reviewed for issues:  Issues making decisions:  Less interest in hobbies /  activities:  Repeats questions, stories (family complaining):  Trouble using ordinary gadgets (microwave, computer, phone):  Forgets the month or year:   Mismanaging finances:   Remembering appts:  Daily  problems with thinking and/or memory: Ad8 score is=         Immunization History  Administered Date(s) Administered  . Fluad Quad(high Dose 65+) 09/24/2019  . Influenza,inj,Quad PF,6+ Mos 09/14/2013  . Pneumococcal Conjugate-13 09/12/2015  . Pneumococcal Polysaccharide-23 06/05/2012  . Tdap 12/27/2003, 03/15/2014   Screening Tests Health Maintenance  Topic Date Due  . Hepatitis C Screening  Feb 21, 1946  . URINE MICROALBUMIN  02/19/1957  . COLONOSCOPY  09/28/2017  . PAP SMEAR-Modifier  04/05/2019  . MAMMOGRAM  10/18/2021  . TETANUS/TDAP  03/15/2024  . DEXA SCAN  Completed  . PNA vac Low Risk Adult  Completed  . INFLUENZA VACCINE  Discontinued      Plan:   ***   I have personally reviewed and noted the following in the patient's chart:   . Medical and social history . Use of alcohol, tobacco or illicit drugs  . Current medications and supplements . Functional ability and status . Nutritional status . Physical activity . Advanced directives . List of other physicians . Hospitalizations, surgeries, and ER visits in previous 12 months . Vitals . Screenings to include cognitive, depression, and falls . Referrals and appointments  In addition, I have reviewed and discussed with patient certain preventive protocols, quality metrics, and best practice recommendations. A written personalized care plan for preventive services as well as general preventive health recommendations were provided to patient.     Shela Nevin, [place]  01/08/2020

## 2020-01-08 NOTE — Telephone Encounter (Signed)
Contacted patient   OK to hold plavix

## 2020-01-08 NOTE — Patient Instructions (Addendum)
If you are age 73 or older, your body mass index should be between 23-30. Your Body mass index is 34.69 kg/m. If this is out of the aforementioned range listed, please consider follow up with your Primary Care Provider.  If you are age 57 or younger, your body mass index should be between 19-25. Your Body mass index is 34.69 kg/m. If this is out of the aformentioned range listed, please consider follow up with your Primary Care Provider.   You have been scheduled for a colonoscopy. Please follow written instructions given to you at your visit today.  Please pick up your prep supplies at the pharmacy within the next 1-3 days. If you use inhalers (even only as needed), please bring them with you on the day of your procedure. Your physician has requested that you go to www.startemmi.com and enter the access code given to you at your visit today. This web site gives a general overview about your procedure. However, you should still follow specific instructions given to you by our office regarding your preparation for the procedure.  We have sent the following medications to your pharmacy for you to pick up at your convenience: [place] will be contacted by our office prior to your procedure for directions on holding your Plavix.  If you do not hear from our office 1 week prior to your scheduled procedure, please call [phone] to discuss.   Thank you for choosing me and [facility].   Tye Savoy, NP

## 2020-01-11 ENCOUNTER — Encounter: Payer: Self-pay | Admitting: Internal Medicine

## 2020-01-11 ENCOUNTER — Telehealth: Payer: Self-pay | Admitting: Nurse Practitioner

## 2020-01-11 ENCOUNTER — Other Ambulatory Visit: Payer: Self-pay

## 2020-01-11 ENCOUNTER — Ambulatory Visit (INDEPENDENT_AMBULATORY_CARE_PROVIDER_SITE_OTHER): Payer: Medicare Other

## 2020-01-11 ENCOUNTER — Ambulatory Visit: Payer: Medicare Other | Admitting: *Deleted

## 2020-01-11 ENCOUNTER — Ambulatory Visit (INDEPENDENT_AMBULATORY_CARE_PROVIDER_SITE_OTHER): Payer: Medicare Other | Admitting: Family Medicine

## 2020-01-11 ENCOUNTER — Encounter: Payer: Self-pay | Admitting: Family Medicine

## 2020-01-11 DIAGNOSIS — I10 Essential (primary) hypertension: Secondary | ICD-10-CM

## 2020-01-11 DIAGNOSIS — R079 Chest pain, unspecified: Secondary | ICD-10-CM | POA: Diagnosis not present

## 2020-01-11 DIAGNOSIS — E890 Postprocedural hypothyroidism: Secondary | ICD-10-CM

## 2020-01-11 DIAGNOSIS — Z1159 Encounter for screening for other viral diseases: Secondary | ICD-10-CM

## 2020-01-11 DIAGNOSIS — F320 Major depressive disorder, single episode, mild: Secondary | ICD-10-CM

## 2020-01-11 DIAGNOSIS — E782 Mixed hyperlipidemia: Secondary | ICD-10-CM

## 2020-01-11 MED ORDER — PEG-KCL-NACL-NASULF-NA ASC-C 100 G PO SOLR: 1.0000 | Freq: Once | ORAL | 0 refills | Status: AC

## 2020-01-11 MED ORDER — LEVOTHYROXINE SODIUM 112 MCG PO TABS: 112.0000 ug | ORAL_TABLET | Freq: Every day | ORAL | 1 refills | Status: DC

## 2020-01-11 MED ORDER — NA SULFATE-K SULFATE-MG SULF 17.5-3.13-1.6 GM/177ML PO SOLN: ORAL | 0 refills | Status: DC

## 2020-01-11 MED ORDER — NITROGLYCERIN 0.4 MG SL SUBL: 0.4000 mg | SUBLINGUAL_TABLET | SUBLINGUAL | 3 refills | Status: DC | PRN

## 2020-01-11 NOTE — Telephone Encounter (Signed)
error 

## 2020-01-11 NOTE — Telephone Encounter (Signed)
   Primary Cardiologist: Dorris Carnes, MD  Chart reviewed as part of pre-operative protocol coverage. Given past medical history and time since last visit, based on ACC/AHA guidelines, [name] would be at acceptable risk for the planned procedure without further cardiovascular testing.   Patient has a remote hx of PCI/DES to RCA in 2012 with no subsequent interventions and patent stents per [facility] 11/2018. Per Dr. Harrington Challenger, patient may hold Plavix therapy for 3-5 days prior to procedure then resume when safe per surgical team.   I will route this recommendation to the requesting party via [facility] fax function and remove from pre-op pool.  Please call with questions.  Kathyrn Drown, NP 01/11/2020, 7:43 AM

## 2020-01-11 NOTE — Assessment & Plan Note (Signed)
On Levothyroxine, continue to monitor 

## 2020-01-11 NOTE — Patient Instructions (Addendum)
Multivitamin with minerals, selenium daily  Vitamin D 12-1998 IU daily Zinc 50 mg daily  Continue aspirin daily Probiotics daily, phillips colon health, culturelle, align  Melatonin 2-5 mg at bedtime  COVID-19 COVID-19 is a respiratory infection that is caused by a virus called severe acute respiratory syndrome coronavirus 2 (SARS-CoV-2). The disease is also known as coronavirus disease or novel coronavirus. In some people, the virus may not cause any symptoms. In others, it may cause a serious infection. The infection can get worse quickly and can lead to complications, such as:  Pneumonia, or infection of the lungs.  Acute respiratory distress syndrome or ARDS. This is a condition in which fluid build-up in the lungs prevents the lungs from filling with air and passing oxygen into the blood.  Acute respiratory failure. This is a condition in which there is not enough oxygen passing from the lungs to the body or when carbon dioxide is not passing from the lungs out of the body.  Sepsis or septic shock. This is a serious bodily reaction to an infection.  Blood clotting problems.  Secondary infections due to bacteria or fungus.  Organ failure. This is when your body's organs stop working. The virus that causes COVID-19 is contagious. This means that it can spread from person to person through droplets from coughs and sneezes (respiratory secretions). What are the causes? This illness is caused by a virus. You may catch the virus by:  Breathing in droplets from an infected person. Droplets can be spread by a person breathing, speaking, singing, coughing, or sneezing.  Touching something, like a table or a doorknob, that was exposed to the virus (contaminated) and then touching your mouth, nose, or eyes. What increases the risk? Risk for infection You are more likely to be infected with this virus if you:  Are within 6 feet (2 meters) of a person with COVID-19.  Provide care for or  live with a person who is infected with COVID-19.  Spend time in crowded indoor spaces or live in shared housing. Risk for serious illness You are more likely to become seriously ill from the virus if you:  Are 73 years of age or older. The higher your age, the more you are at risk for serious illness.  Live in a nursing home or long-term care facility.  Have cancer.  Have a long-term (chronic) disease such as: ? Chronic lung disease, including chronic obstructive pulmonary disease or asthma. ? A long-term disease that lowers your body's ability to fight infection (immunocompromised). ? Heart disease, including heart failure, a condition in which the arteries that lead to the heart become narrow or blocked (coronary artery disease), a disease which makes the heart muscle thick, weak, or stiff (cardiomyopathy). ? Diabetes. ? Chronic kidney disease. ? Sickle cell disease, a condition in which red blood cells have an abnormal "sickle" shape. ? Liver disease.  Are obese. What are the signs or symptoms? Symptoms of this condition can range from mild to severe. Symptoms may appear any time from 2 to 14 days after being exposed to the virus. They include:  A fever or chills.  A cough.  Difficulty breathing.  Headaches, body aches, or muscle aches.  Runny or stuffy (congested) nose.  A sore throat.  New loss of taste or smell. Some people may also have stomach problems, such as nausea, vomiting, or diarrhea. Other people may not have any symptoms of COVID-19. How is this diagnosed? This condition may be diagnosed based on:  Your signs and symptoms, especially if: ? You live in an area with a COVID-19 outbreak. ? You recently traveled to or from an area where the virus is common. ? You provide care for or live with a person who was diagnosed with COVID-19. ? You were exposed to a person who was diagnosed with COVID-19.  A physical exam.  Lab tests, which may  include: ? Taking a sample of fluid from the back of your nose and throat (nasopharyngeal fluid), your nose, or your throat using a swab. ? A sample of mucus from your lungs (sputum). ? Blood tests.  Imaging tests, which may include, X-rays, CT scan, or ultrasound. How is this treated? At present, there is no medicine to treat COVID-19. Medicines that treat other diseases are being used on a trial basis to see if they are effective against COVID-19. Your health care provider will talk with you about ways to treat your symptoms. For most people, the infection is mild and can be managed at home with rest, fluids, and over-the-counter medicines. Treatment for a serious infection usually takes places in a hospital intensive care unit (ICU). It may include one or more of the following treatments. These treatments are given until your symptoms improve.  Receiving fluids and medicines through an IV.  Supplemental oxygen. Extra oxygen is given through a tube in the nose, a face mask, or a hood.  Positioning you to lie on your stomach (prone position). This makes it easier for oxygen to get into the lungs.  Continuous positive airway pressure (CPAP) or bi-level positive airway pressure (BPAP) machine. This treatment uses mild air pressure to keep the airways open. A tube that is connected to a motor delivers oxygen to the body.  Ventilator. This treatment moves air into and out of the lungs by using a tube that is placed in your windpipe.  Tracheostomy. This is a procedure to create a hole in the neck so that a breathing tube can be inserted.  Extracorporeal membrane oxygenation (ECMO). This procedure gives the lungs a chance to recover by taking over the functions of the heart and lungs. It supplies oxygen to the body and removes carbon dioxide. Follow these instructions at home: Lifestyle  If you are sick, stay home except to get medical care. Your health care provider will tell you how long to  stay home. Call your health care provider before you go for medical care.  Rest at home as told by your health care provider.  Do not use any products that contain nicotine or tobacco, such as cigarettes, e-cigarettes, and chewing tobacco. If you need help quitting, ask your health care provider.  Return to your normal activities as told by your health care provider. Ask your health care provider what activities are safe for you. General instructions  Take over-the-counter and prescription medicines only as told by your health care provider.  Drink enough fluid to keep your urine pale yellow.  Keep all follow-up visits as told by your health care provider. This is important. How is this prevented?  There is no vaccine to help prevent COVID-19 infection. However, there are steps you can take to protect yourself and others from this virus. To protect yourself:   Do not travel to areas where COVID-19 is a risk. The areas where COVID-19 is reported change often. To identify high-risk areas and travel restrictions, check the CDC travel website: FatFares.com.br  If you live in, or must travel to, an area where COVID-19  is a risk, take precautions to avoid infection. ? Stay away from people who are sick. ? Wash your hands often with soap and water for 20 seconds. If soap and water are not available, use an alcohol-based hand sanitizer. ? Avoid touching your mouth, face, eyes, or nose. ? Avoid going out in public, follow guidance from your state and local health authorities. ? If you must go out in public, wear a cloth face covering or face mask. Make sure your mask covers your nose and mouth. ? Avoid crowded indoor spaces. Stay at least 6 feet (2 meters) away from others. ? Disinfect objects and surfaces that are frequently touched every day. This may include:  Counters and tables.  Doorknobs and light switches.  Sinks and faucets.  Electronics, such as phones, remote  controls, keyboards, computers, and tablets. To protect others: If you have symptoms of COVID-19, take steps to prevent the virus from spreading to others.  If you think you have a COVID-19 infection, contact your health care provider right away. Tell your health care team that you think you may have a COVID-19 infection.  Stay home. Leave your house only to seek medical care. Do not use public transport.  Do not travel while you are sick.  Wash your hands often with soap and water for 20 seconds. If soap and water are not available, use alcohol-based hand sanitizer.  Stay away from other members of your household. Let healthy household members care for children and pets, if possible. If you have to care for children or pets, wash your hands often and wear a mask. If possible, stay in your own room, separate from others. Use a different bathroom.  Make sure that all people in your household wash their hands well and often.  Cough or sneeze into a tissue or your sleeve or elbow. Do not cough or sneeze into your hand or into the air.  Wear a cloth face covering or face mask. Make sure your mask covers your nose and mouth. Where to find more information  Centers for Disease Control and Prevention: PurpleGadgets.be  World Health Organization: https://www.castaneda.info/ Contact a health care provider if:  You live in or have traveled to an area where COVID-19 is a risk and you have symptoms of the infection.  You have had contact with someone who has COVID-19 and you have symptoms of the infection. Get help right away if:  You have trouble breathing.  You have pain or pressure in your chest.  You have confusion.  You have bluish lips and fingernails.  You have difficulty waking from sleep.  You have symptoms that get worse. These symptoms may represent a serious problem that is an emergency. Do not wait to see if the symptoms will go away.  Get medical help right away. Call your local emergency services (911 in the [place] Do not drive yourself to the hospital. Let the emergency medical personnel know if you think you have COVID-19. Summary  COVID-19 is a respiratory infection that is caused by a virus. It is also known as coronavirus disease or novel coronavirus. It can cause serious infections, such as pneumonia, acute respiratory distress syndrome, acute respiratory failure, or sepsis.  The virus that causes COVID-19 is contagious. This means that it can spread from person to person through droplets from breathing, speaking, singing, coughing, or sneezing.  You are more likely to develop a serious illness if you are 71 years of age or older, have a weak immune  system, live in a nursing home, or have chronic disease.  There is no medicine to treat COVID-19. Your health care provider will talk with you about ways to treat your symptoms.  Take steps to protect yourself and others from infection. Wash your hands often and disinfect objects and surfaces that are frequently touched every day. Stay away from people who are sick and wear a mask if you are sick. This information is not intended to replace advice given to you by your health care provider. Make sure you discuss any questions you have with your health care provider. Document Revised: 10/09/2019 Document Reviewed: 01/15/2019 Elsevier Patient Education  [place].

## 2020-01-11 NOTE — Assessment & Plan Note (Signed)
Well controlled, no changes to meds. Encouraged heart healthy diet such as the DASH diet and exercise as tolerated.  °

## 2020-01-11 NOTE — Telephone Encounter (Signed)
Pt reported that insurance does not cover prep soln.  Pt did not state an alternative.

## 2020-01-11 NOTE — Progress Notes (Signed)
Subjective:    Patient ID: [name], adult    DOB: 1946/12/01, 74 y.o.   MRN: [id number]  No chief complaint on file.   HPI Patient is in today for follow up on chronic medical concerns. No recent febrile illness or hospitalizations. No polyuria or polydipsia. She notes anhedonia and increased stress durign the pandemic. Denies CP/palp/SOB/HA/congestion/fevers/GI or GU c/o. Taking meds as prescribed.  Past Medical History:  Diagnosis Date  . Acute MI, subendocardial ([place])   . Arthritis   . Back pain 05/31/2013  . Benign paroxysmal positional vertigo 08/05/2016  . CAD (coronary artery disease)    a. NSTEMI 10/2011: [facility] 11/19/11: pLAD 30%, oOM 60%, AVCFX 30%, CFX after OM2 30%, pRCA 60 and 70%, then 99%, AM 80-90% with TIMI 3 flow.  PCI: Promus DES x 2 to RCA; b. 06/2012 Cath: patent RCA stents w/ subtl occl of Acute Marginal (jailed)->Med rx; c. 05/2015 MV: EF 59%, mod mid infsept/inf/ap lat/ap infarct with peri-infarct isch-->Med Rx; d. 02/2016 Cath: LM nl, LAD 29m RI 50, RCA patent stents.  . Colitis   . Facial skin lesion 01/28/2017  . GERD (gastroesophageal reflux disease)    occasional  . Hyperlipidemia   . Hypertension   . Hypertensive heart disease    a. Echocardiogram 11/19/11: Difficult acoustic windows, EF 60-65%, normal LV wall thickness, grade 1 diastolic dysfunction  . Hypoparathyroidism ([place]   . Low zinc level 11/26/2016  . Multinodular thyroid    Goiter s/p thyroidectomy in 2007 with post-op  hypocalcemia and post-op hypothyroidism/hypoparathyroidism with hypocalcemia  . Neck pain on right side 07/13/2013  . Nocturia 05/31/2013  . Pain in right axilla 03/13/2016  . Palpitations    a. 03/2012 - patient set up for event monitor but did not wear correctly -  she declined wearing a repeat monitor  . Post-surgical hypothyroidism   . Sun-damaged skin 12/05/2014  . Tubular adenoma of colon 06/2011  . Unspecified constipation 05/31/2013  . Vertigo   . Zinc deficiency  11/26/2015    Past Surgical History:  Procedure Laterality Date  . CARDIAC CATHETERIZATION N/A 03/08/2016   Procedure: Left Heart Cath and Coronary Angiography;  Surgeon: JJettie Booze MD;  [facility];  Service: Cardiovascular;  Laterality: N/A;  . COLONOSCOPY  Aenomatous polyps   07/05/2011  . COLONOSCOPY N/A 09/28/2014   Procedure: COLONOSCOPY;  Surgeon: MLadene Artist MD;  Location: [facility];  Service: Endoscopy;  Laterality: N/A;  . CORONARY ANGIOPLASTY WITH STENT PLACEMENT    . LEFT HEART CATH AND CORONARY ANGIOGRAPHY N/A 12/09/2018   Procedure: LEFT HEART CATH AND CORONARY ANGIOGRAPHY;  Surgeon: VJettie Booze MD;  [facility];  Service: Cardiovascular;  Laterality: N/A;  . LEFT HEART CATHETERIZATION WITH CORONARY ANGIOGRAM N/A 07/17/2012   Procedure: LEFT HEART CATHETERIZATION WITH CORONARY ANGIOGRAM;  Surgeon: THillary Bow MD;  [facility];  Service: Cardiovascular;  Laterality: N/A;  . PERCUTANEOUS CORONARY STENT INTERVENTION (PCI-S) N/A 11/19/2011   Procedure: PERCUTANEOUS CORONARY STENT INTERVENTION (PCI-S);  Surgeon: THillary Bow MD;  [facility];  Service: Cardiovascular;  Laterality: N/A;  . SHOULDER ARTHROSCOPY W/ ROTATOR CUFF REPAIR     right  . TOTAL THYROIDECTOMY  2007   GOITER    Family History  Problem Relation Age of Onset  . Colon cancer Brother   . Cancer Brother        COLON  . Hypertension Father   . Heart disease  Father   . Heart attack Father   . Blindness Sister   . Congestive Heart Failure Sister   . Hypertension Sister   . Healthy Daughter   . Healthy Son   . Thyroid disease Sister   . Cancer Brother        multiple myelomas  . Healthy Daughter   . Healthy Daughter   . Diabetes Neg Hx   . Prostate cancer Neg Hx   . Breast cancer Neg Hx     Social History   Socioeconomic History  . Marital status: Married    Spouse name: Not on file  . Number of children: 4   . Years of education: 78  . Highest education level: Not on file  Occupational History  . Occupation: Sales person at [employer]  . Smoking status: Never Smoker  . Smokeless tobacco: Never Used  Substance and Sexual Activity  . Alcohol use: Yes    Comment: rare once per month  . Drug use: No  . Sexual activity: Not Currently  Other Topics Concern  . Not on file  Social History Narrative   Lives at home alone.  Her son lives near her.   Right-handed.   1 cup coffee per day.   Social Determinants of Health   Financial Resource Strain:   . Difficulty of Paying Living Expenses: Not on file  Food Insecurity:   . Worried About Charity fundraiser in the Last Year: Not on file  . Ran Out of Food in the Last Year: Not on file  Transportation Needs:   . Lack of Transportation (Medical): Not on file  . Lack of Transportation (Non-Medical): Not on file  Physical Activity:   . Days of Exercise per Week: Not on file  . Minutes of Exercise per Session: Not on file  Stress:   . Feeling of Stress : Not on file  Social Connections:   . Frequency of Communication with Friends and Family: Not on file  . Frequency of Social Gatherings with Friends and Family: Not on file  . Attends Religious Services: Not on file  . Active Member of Clubs or Organizations: Not on file  . Attends Archivist Meetings: Not on file  . Marital Status: Not on file  Intimate Partner Violence:   . Fear of Current or Ex-Partner: Not on file  . Emotionally Abused: Not on file  . Physically Abused: Not on file  . Sexually Abused: Not on file    Outpatient Medications Prior to Visit  Medication Sig Dispense Refill  . ALPRAZolam (XANAX) 0.25 MG tablet Take 1 tablet (0.25 mg total) by mouth 2 (two) times daily as needed for anxiety. 30 tablet 0  . amLODipine (NORVASC) 10 MG tablet Take 1 tablet (10 mg total) by mouth daily. 90 tablet 3  . aspirin EC 81 MG tablet Take 81 mg by mouth daily.      .  B Complex-C (B-COMPLEX WITH VITAMIN C) tablet Take 1 tablet by mouth daily.    . bisoprolol (ZEBETA) 5 MG tablet Take 5 mg by mouth daily.     . calcitRIOL (ROCALTROL) 0.25 MCG capsule TAKE 3 CAPSULES(0.75 MCG) BY MOUTH DAILY 270 capsule 2  . cholecalciferol (VITAMIN D) 1000 UNITS tablet Take 1,000 Units by mouth daily.    . clopidogrel (PLAVIX) 75 MG tablet TAKE 1 TABLET(75 MG) BY MOUTH DAILY 90 tablet 2  . Evolocumab (REPATHA SURECLICK) 417 MG/ML SOAJ Inject 1 pen into  the skin every 14 (fourteen) days. 2 pen 11  . isosorbide mononitrate (IMDUR) 60 MG 24 hr tablet Take 1 tablet (60 mg total) by mouth daily. 90 tablet 3  . ketoconazole (NIZORAL) 2 % cream Apply 1 fingertip amount to each foot daily. 30 g 0  . Multiple Vitamin (MULTIVITAMIN WITH MINERALS) TABS tablet Take 1 tablet by mouth daily.    . pantoprazole (PROTONIX) 40 MG tablet Take 1 tablet (40 mg total) by mouth daily. 90 tablet 3  . peg 3350 powder (MOVIPREP) 100 g SOLR Take 1 kit (200 g total) by mouth once for 1 dose. 1 kit 0  . rosuvastatin (CRESTOR) 40 MG tablet TAKE 1 TABLET(40 MG) BY MOUTH DAILY 90 tablet 2  . traMADol (ULTRAM) 50 MG tablet tramadol 50 mg tablet  TK 1 T PO TID PRN    . levothyroxine (SYNTHROID) 112 MCG tablet Take 1 tablet (112 mcg total) by mouth daily. 90 tablet 3  . nitroGLYCERIN (NITROSTAT) 0.4 MG SL tablet Place 1 tablet (0.4 mg total) under the tongue every 5 (five) minutes as needed for chest pain. 60 tablet 3   No facility-administered medications prior to visit.    Allergies  Allergen Reactions  . Zetia [Ezetimibe] Other (See Comments)    Myalgia, paresthesias and weakness    Review of Systems  Constitutional: Positive for malaise/fatigue. Negative for fever.  HENT: Negative for congestion.   Eyes: Negative for blurred vision.  Respiratory: Negative for shortness of breath.   Cardiovascular: Negative for chest pain, palpitations and leg swelling.  Gastrointestinal: Negative for abdominal  pain, blood in stool and nausea.  Genitourinary: Negative for dysuria and frequency.  Musculoskeletal: Negative for falls.  Skin: Negative for rash.  Neurological: Negative for dizziness, loss of consciousness and headaches.  Endo/Heme/Allergies: Negative for environmental allergies.  Psychiatric/Behavioral: Positive for depression. The patient is nervous/anxious.        Objective:    Physical Exam Vitals and nursing note reviewed.  Constitutional:      General: She is not in acute distress.    Appearance: She is well-developed.  HENT:     Head: Normocephalic and atraumatic.     Nose: Nose normal.  Eyes:     General:        Right eye: No discharge.        Left eye: No discharge.  Cardiovascular:     Rate and Rhythm: Normal rate and regular rhythm.     Heart sounds: No murmur.  Pulmonary:     Effort: Pulmonary effort is normal.     Breath sounds: Normal breath sounds.  Abdominal:     General: Bowel sounds are normal.     Palpations: Abdomen is soft.     Tenderness: There is no abdominal tenderness.  Musculoskeletal:     Cervical back: Normal range of motion and neck supple.  Skin:    General: Skin is warm and dry.  Neurological:     Mental Status: She is alert and oriented to person, place, and time.     BP (!) 120/52 (BP Location: Left Arm, Patient Position: Sitting, Cuff Size: Normal)   Pulse 61   Temp 98.3 F (36.8 C) (Temporal)   Resp 18   Wt 208 lb 12.8 oz (94.7 kg)   SpO2 96%   BMI 35.84 kg/m  Wt Readings from Last 3 Encounters:  01/11/20 208 lb 12.8 oz (94.7 kg)  01/08/20 202 lb 2 oz (91.7 kg)  11/09/19 202 lb 3.2  oz (91.7 kg)    Diabetic Foot Exam - Simple   No data filed     Lab Results  Component Value Date   WBC 8.3 11/09/2019   HGB 13.2 11/09/2019   HCT 40.3 11/09/2019   PLT 241.0 11/09/2019   GLUCOSE 98 11/09/2019   CHOL 102 10/12/2019   TRIG 224.0 (H) 10/12/2019   HDL 38.90 (L) 10/12/2019   LDLDIRECT 33.0 10/12/2019   LDLCALC 62  04/23/2019   ALT 14 11/09/2019   AST 14 11/09/2019   NA 144 11/09/2019   K 4.5 11/09/2019   CL 104 11/09/2019   CREATININE 1.13 11/09/2019   BUN 31 (H) 11/09/2019   CO2 29 11/09/2019   TSH 5.82 (H) 10/12/2019   INR 0.86 03/07/2016   HGBA1C 5.9 10/12/2019    Lab Results  Component Value Date   TSH 5.82 (H) 10/12/2019   Lab Results  Component Value Date   WBC 8.3 11/09/2019   HGB 13.2 11/09/2019   HCT 40.3 11/09/2019   MCV 89.4 11/09/2019   PLT 241.0 11/09/2019   Lab Results  Component Value Date   NA 144 11/09/2019   K 4.5 11/09/2019   CO2 29 11/09/2019   GLUCOSE 98 11/09/2019   BUN 31 (H) 11/09/2019   CREATININE 1.13 11/09/2019   BILITOT 0.5 11/09/2019   ALKPHOS 76 11/09/2019   AST 14 11/09/2019   ALT 14 11/09/2019   PROT 7.0 11/09/2019   ALBUMIN 4.1 11/09/2019   CALCIUM 8.8 11/09/2019   ANIONGAP 7 02/25/2019   GFR 47.24 (L) 11/09/2019   Lab Results  Component Value Date   CHOL 102 10/12/2019   Lab Results  Component Value Date   HDL 38.90 (L) 10/12/2019   Lab Results  Component Value Date   LDLCALC 62 04/23/2019   Lab Results  Component Value Date   TRIG 224.0 (H) 10/12/2019   Lab Results  Component Value Date   CHOLHDL 3 10/12/2019   Lab Results  Component Value Date   HGBA1C 5.9 10/12/2019       Assessment & Plan:   Problem List Items Addressed This Visit    Hypothyroidism    On Levothyroxine, continue to monitor      Relevant Medications   levothyroxine (SYNTHROID) 112 MCG tablet   Hyperlipidemia    Encouraged heart healthy diet, increase exercise, avoid trans fats, consider a krill oil cap daily, tolerating Rosuvastatin      Relevant Medications   nitroGLYCERIN (NITROSTAT) 0.4 MG SL tablet   Essential hypertension, benign    Well controlled, no changes to meds. Encouraged heart healthy diet such as the DASH diet and exercise as tolerated.       Relevant Medications   nitroGLYCERIN (NITROSTAT) 0.4 MG SL tablet   Depression     Struggling with stress and isolation during the pandemic, discussed strategies to end the isolation and she is inclined to get the immunization. If she worsens we may have to alter medications.       Chest pain   Relevant Medications   nitroGLYCERIN (NITROSTAT) 0.4 MG SL tablet      I am having Laycie T. Ringel maintain her aspirin EC, B-complex with vitamin C, cholecalciferol, multivitamin with minerals, ketoconazole, ALPRAZolam, Evolocumab, calcitRIOL, isosorbide mononitrate, bisoprolol, traMADol, amLODipine, clopidogrel, rosuvastatin, pantoprazole, peg 3350 powder, levothyroxine, and nitroGLYCERIN.  Meds ordered this encounter  Medications  . levothyroxine (SYNTHROID) 112 MCG tablet    Sig: Take 1 tablet (112 mcg total) by mouth daily.  Dispense:  90 tablet    Refill:  1  . nitroGLYCERIN (NITROSTAT) 0.4 MG SL tablet    Sig: Place 1 tablet (0.4 mg total) under the tongue every 5 (five) minutes as needed for chest pain.    Dispense:  25 tablet    Refill:  3     Penni Homans, MD

## 2020-01-11 NOTE — Assessment & Plan Note (Signed)
Struggling with stress and isolation during the pandemic, discussed strategies to end the isolation and she is inclined to get the immunization. If she worsens we may have to alter medications.

## 2020-01-11 NOTE — Assessment & Plan Note (Signed)
Encouraged heart healthy diet, increase exercise, avoid trans fats, consider a krill oil cap daily, tolerating Rosuvastatin 

## 2020-01-12 ENCOUNTER — Telehealth: Payer: Self-pay

## 2020-01-12 LAB — SARS CORONAVIRUS 2 (TAT 6-24 HRS): SARS Coronavirus 2: NEGATIVE

## 2020-01-12 NOTE — Telephone Encounter (Signed)
Pt called yesterday stating Moviprep too expensive.  I told her I sent a new prescription for Suprep to her pharmacy.  I told her she would need to come to the office to pick up new instructions.  She stated could I not give her the instructions over the phone.  I explained to her the instructions are step by step and she would need to pick up paper copy.  She stated she lived a good ways out and that she could not come.  She stated she has been holding her Plavix and already had covid screening.  I asked if someone could pick up instructions for her since she is ready for procedure.  She stated no. I told her she would need to resume Plavix if not having procedure Wed and she would need another Covid screening done.  I offered her the choice to reschedule and that's what she decided to do.  New instructions mailed to patient.

## 2020-01-13 ENCOUNTER — Encounter: Payer: Medicare Other | Admitting: Gastroenterology

## 2020-01-14 ENCOUNTER — Telehealth: Payer: Self-pay | Admitting: Internal Medicine

## 2020-01-14 NOTE — Telephone Encounter (Signed)
Mrs. Garciarodrigue was calling in reference to her husband. Not herself. Encounter closed.

## 2020-01-14 NOTE — Telephone Encounter (Signed)
Patient states she is returning call for test results. Please call.

## 2020-01-21 ENCOUNTER — Ambulatory Visit: Payer: Medicare Other

## 2020-01-28 ENCOUNTER — Ambulatory Visit: Payer: Medicare Other | Attending: Internal Medicine

## 2020-01-28 DIAGNOSIS — Z23 Encounter for immunization: Secondary | ICD-10-CM

## 2020-01-29 ENCOUNTER — Ambulatory Visit: Payer: Medicare Other

## 2020-02-07 ENCOUNTER — Other Ambulatory Visit: Payer: Self-pay | Admitting: Internal Medicine

## 2020-02-07 DIAGNOSIS — E892 Postprocedural hypoparathyroidism: Secondary | ICD-10-CM

## 2020-02-08 NOTE — Telephone Encounter (Signed)
Pt's pharmacy is requesting a refill on calcitriol. Would Dr. Ross like to refill this medication? Please address 

## 2020-02-19 ENCOUNTER — Telehealth: Payer: Self-pay | Admitting: Internal Medicine

## 2020-02-19 NOTE — Telephone Encounter (Signed)
I spoke with this patient. This encounter is not needed.  She was calling in reference to her husband.

## 2020-02-19 NOTE — Progress Notes (Deleted)
Office Visit Note  Patient: [name]             Date of Birth: [birth year]           MRN: [id number]             PCP: Mosie Lukes, MD Referring: Mosie Lukes, MD Visit Date: 02/25/2020 Occupation: @GUAROCC @  Subjective:  No chief complaint on file.   History of Present Illness: [name] is a 74 y.o. adult ***   Activities of Daily Living:  Patient reports morning stiffness for *** {minute/hour:19697}.   Patient {ACTIONS;DENIES/REPORTS:21021675::"Denies"} nocturnal pain.  Difficulty dressing/grooming: {ACTIONS;DENIES/REPORTS:21021675::"Denies"} Difficulty climbing stairs: {ACTIONS;DENIES/REPORTS:21021675::"Denies"} Difficulty getting out of chair: {ACTIONS;DENIES/REPORTS:21021675::"Denies"} Difficulty using hands for taps, buttons, cutlery, and/or writing: {ACTIONS;DENIES/REPORTS:21021675::"Denies"}  No Rheumatology ROS completed.   PMFS History:  Patient Active Problem List   Diagnosis Date Noted  . Breast pain, left 11/09/2019  . Arm lesion 11/09/2019  . Hypoglycemia 10/07/2019  . Heart disease 01/29/2019  . Sensorineural hearing loss (SNHL) of both ears 01/27/2019  . Pulmonary congestion 01/19/2019  . Primary osteoarthritis of both knees 12/19/2018  . DDD (degenerative disc disease), lumbar 12/19/2018  . ANA positive 10/27/2018  . CAP (community acquired pneumonia) 03/06/2018  . Lower extremity pain, lateral, right 02/17/2018  . Post-menopausal bleeding 02/17/2018  . Cephalalgia 02/17/2018  . Anxiety 02/17/2018  . Peripheral arterial disease ([place]) 02/20/2017  . Facial skin lesion 01/28/2017  . Benign paroxysmal positional vertigo 08/05/2016  . Atypical chest pain 03/27/2016  . Antiplatelet or antithrombotic long-term use 03/27/2016  . Pain in right axilla 03/13/2016  . Chest pain 03/07/2016  . Right lumbar radiculopathy 02/21/2016  . Abnormality of gait 02/21/2016  . Zinc deficiency 11/26/2015  . Chronic low back pain 10/26/2015  .  Right hip pain 10/26/2015  . Medicare annual wellness visit, subsequent 03/13/2015  . Preventative health care 03/13/2015  . Sun-damaged skin 12/05/2014  . Angina of effort ([place]) 12/02/2014  . Hx of colonic polyps 09/28/2014  . Benign neoplasm of descending colon 09/28/2014  . Other fatigue 09/10/2014  . Personal history of colonic polyps 07/16/2014  . Bilateral hand pain 03/20/2014  . Postsurgical hypoparathyroidism ([place]) 08/03/2013  . Neck pain on right side 07/13/2013  . Constipation 05/31/2013  . Back pain 05/31/2013  . Nocturia 05/31/2013  . GERD (gastroesophageal reflux disease) 11/02/2012  . Tension headache 11/02/2012  . Anemia 06/11/2012  . Hypokalemia 06/11/2012  . Depression 01/21/2012  . Headache(784.0) 12/23/2011  . Bradycardia 12/07/2011  . Hypocalcemia 12/07/2011  . Acute MI, subendocardial ([place]) 11/20/2011  . Hypothyroidism 05/20/2009  . Essential hypertension, benign 05/20/2009  . Hyperlipidemia 03/08/2009  . CAD, NATIVE VESSEL 03/08/2009    Past Medical History:  Diagnosis Date  . Acute MI, subendocardial ([place])   . Arthritis   . Back pain 05/31/2013  . Benign paroxysmal positional vertigo 08/05/2016  . CAD (coronary artery disease)    a. NSTEMI 10/2011: [facility] 11/19/11: pLAD 30%, oOM 60%, AVCFX 30%, CFX after OM2 30%, pRCA 60 and 70%, then 99%, AM 80-90% with TIMI 3 flow.  PCI: Promus DES x 2 to RCA; b. 06/2012 Cath: patent RCA stents w/ subtl occl of Acute Marginal (jailed)->Med rx; c. 05/2015 MV: EF 59%, mod mid infsept/inf/ap lat/ap infarct with peri-infarct isch-->Med Rx; d. 02/2016 Cath: LM nl, LAD 43m, RI 50, RCA patent stents.  . Colitis   . Facial skin lesion 01/28/2017  . GERD (gastroesophageal reflux disease)    occasional  . Hyperlipidemia   .  Hypertension   . Hypertensive heart disease    a. Echocardiogram 11/19/11: Difficult acoustic windows, EF 60-65%, normal LV wall thickness, grade 1 diastolic dysfunction  . Hypoparathyroidism ([place])   . Low zinc  level 11/26/2016  . Multinodular thyroid    Goiter s/p thyroidectomy in 2007 with post-op  hypocalcemia and post-op hypothyroidism/hypoparathyroidism with hypocalcemia  . Neck pain on right side 07/13/2013  . Nocturia 05/31/2013  . Pain in right axilla 03/13/2016  . Palpitations    a. 03/2012 - patient set up for event monitor but did not wear correctly -  she declined wearing a repeat monitor  . Post-surgical hypothyroidism   . Sun-damaged skin 12/05/2014  . Tubular adenoma of colon 06/2011  . Unspecified constipation 05/31/2013  . Vertigo   . Zinc deficiency 11/26/2015    Family History  Problem Relation Age of Onset  . Colon cancer Brother   . Cancer Brother        COLON  . Hypertension Father   . Heart disease Father   . Heart attack Father   . Blindness Sister   . Congestive Heart Failure Sister   . Hypertension Sister   . Healthy Daughter   . Healthy Son   . Thyroid disease Sister   . Cancer Brother        multiple myelomas  . Healthy Daughter   . Healthy Daughter   . Diabetes Neg Hx   . Prostate cancer Neg Hx   . Breast cancer Neg Hx    Past Surgical History:  Procedure Laterality Date  . CARDIAC CATHETERIZATION N/A 03/08/2016   Procedure: Left Heart Cath and Coronary Angiography;  Surgeon: Jettie Booze, MD;  Location: [facility];  Service: Cardiovascular;  Laterality: N/A;  . COLONOSCOPY  Aenomatous polyps   07/05/2011  . COLONOSCOPY N/A 09/28/2014   Procedure: COLONOSCOPY;  Surgeon: Ladene Artist, MD;  Location: [facility];  Service: Endoscopy;  Laterality: N/A;  . CORONARY ANGIOPLASTY WITH STENT PLACEMENT    . LEFT HEART CATH AND CORONARY ANGIOGRAPHY N/A 12/09/2018   Procedure: LEFT HEART CATH AND CORONARY ANGIOGRAPHY;  Surgeon: Jettie Booze, MD;  Location: [facility];  Service: Cardiovascular;  Laterality: N/A;  . LEFT HEART CATHETERIZATION WITH CORONARY ANGIOGRAM N/A 07/17/2012   Procedure: LEFT HEART CATHETERIZATION WITH CORONARY  ANGIOGRAM;  Surgeon: Hillary Bow, MD;  Location: [facility];  Service: Cardiovascular;  Laterality: N/A;  . PERCUTANEOUS CORONARY STENT INTERVENTION (PCI-S) N/A 11/19/2011   Procedure: PERCUTANEOUS CORONARY STENT INTERVENTION (PCI-S);  Surgeon: Hillary Bow, MD;  Location: [facility] CATH LAB;  Service: Cardiovascular;  Laterality: N/A;  . SHOULDER ARTHROSCOPY W/ ROTATOR CUFF REPAIR     right  . TOTAL THYROIDECTOMY  2007   GOITER   Social History   Social History Narrative   Lives at home alone.  Her son lives near her.   Right-handed.   1 cup coffee per day.   Immunization History  Administered Date(s) Administered  . Fluad Quad(high Dose 65+) 09/24/2019  . Influenza,inj,Quad PF,6+ Mos 09/14/2013  . PFIZER SARS-COV-2 Vaccination 01/28/2020  . Pneumococcal Conjugate-13 09/12/2015  . Pneumococcal Polysaccharide-23 06/05/2012  . Tdap 12/27/2003, 03/15/2014     Objective: Vital Signs: There were no vitals taken for this visit.   Physical Exam   Musculoskeletal Exam: ***  CDAI Exam: CDAI Score: -- Patient Global: --; Provider Global: -- Swollen: --; Tender: -- Joint Exam 02/25/2020   No joint exam has been documented for this  visit   There is currently no information documented on the homunculus. Go to the Rheumatology activity and complete the homunculus joint exam.  Investigation: No additional findings.  Imaging: No results found.  Recent Labs: Lab Results  Component Value Date   WBC 8.3 11/09/2019   HGB 13.2 11/09/2019   PLT 241.0 11/09/2019   NA 144 11/09/2019   K 4.5 11/09/2019   CL 104 11/09/2019   CO2 29 11/09/2019   GLUCOSE 98 11/09/2019   BUN 31 (H) 11/09/2019   CREATININE 1.13 11/09/2019   BILITOT 0.5 11/09/2019   ALKPHOS 76 11/09/2019   AST 14 11/09/2019   ALT 14 11/09/2019   PROT 7.0 11/09/2019   ALBUMIN 4.1 11/09/2019   CALCIUM 8.8 11/09/2019   GFRAA 46 (L) 07/27/2019    Speciality Comments: No specialty comments  available.  Procedures:  No procedures performed Allergies: Zetia [ezetimibe]   Assessment / Plan:     Visit Diagnoses: No diagnosis found.  Orders: No orders of the defined types were placed in this encounter.  No orders of the defined types were placed in this encounter.   Face-to-face time spent with patient was *** minutes. Greater than 50% of time was spent in counseling and coordination of care.  Follow-Up Instructions: No follow-ups on file.   Earnestine Mealing, CMA  Note - This record has been created using Editor, commissioning.  Chart creation errors have been sought, but may not always  have been located. Such creation errors do not reflect on  the standard of medical care.

## 2020-02-19 NOTE — Telephone Encounter (Signed)
Left message for patient to call back  

## 2020-02-19 NOTE — Telephone Encounter (Signed)
New Message  Received a phone call from pt. Pt will not give out any information and wants to speak with Dr. Harrington Challenger directly.

## 2020-02-22 ENCOUNTER — Ambulatory Visit: Payer: Medicare Other | Attending: Internal Medicine

## 2020-02-22 DIAGNOSIS — Z23 Encounter for immunization: Secondary | ICD-10-CM

## 2020-02-22 NOTE — Progress Notes (Signed)
   Covid-19 Vaccination Clinic  Name:  [name]    MRN: [id number] DOB: 12-24-1946  02/22/2020  [name] was observed post Covid-19 immunization for 15 minutes without incidence. She was provided with Vaccine Information Sheet and instruction to access the V-Safe system.   [name] was instructed to call 911 with any severe reactions post vaccine: Marland Kitchen Difficulty breathing  . Swelling of your face and throat  . A fast heartbeat  . A bad rash all over your body  . Dizziness and weakness    Immunizations Administered    Name Date Dose VIS Date Route   Pfizer COVID-19 Vaccine 02/22/2020  1:13 PM 0.3 mL 12/04/2019 Intramuscular   Manufacturer: [place]   Lot: [id number]   [place]: [id number]

## 2020-02-25 ENCOUNTER — Ambulatory Visit: Payer: Medicare Other | Admitting: Rheumatology

## 2020-02-29 ENCOUNTER — Other Ambulatory Visit: Payer: Self-pay

## 2020-02-29 ENCOUNTER — Other Ambulatory Visit: Payer: Self-pay | Admitting: Gastroenterology

## 2020-02-29 ENCOUNTER — Ambulatory Visit (INDEPENDENT_AMBULATORY_CARE_PROVIDER_SITE_OTHER): Payer: Medicare Other

## 2020-02-29 DIAGNOSIS — Z1159 Encounter for screening for other viral diseases: Secondary | ICD-10-CM | POA: Diagnosis not present

## 2020-02-29 LAB — SARS CORONAVIRUS 2 (TAT 6-24 HRS): SARS Coronavirus 2: NEGATIVE

## 2020-03-02 ENCOUNTER — Encounter: Payer: Self-pay | Admitting: Gastroenterology

## 2020-03-02 ENCOUNTER — Ambulatory Visit (AMBULATORY_SURGERY_CENTER): Payer: Medicare Other | Admitting: Gastroenterology

## 2020-03-02 ENCOUNTER — Other Ambulatory Visit: Payer: Self-pay

## 2020-03-02 VITALS — BP 119/53 | HR 46 | Temp 97.3°F | Resp 14 | Ht 64.0 in | Wt 202.0 lb

## 2020-03-02 DIAGNOSIS — Z800 Family history of malignant neoplasm of digestive organs: Secondary | ICD-10-CM | POA: Diagnosis not present

## 2020-03-02 DIAGNOSIS — D124 Benign neoplasm of descending colon: Secondary | ICD-10-CM

## 2020-03-02 DIAGNOSIS — Z86010 Personal history of colonic polyps: Secondary | ICD-10-CM | POA: Diagnosis not present

## 2020-03-02 DIAGNOSIS — D120 Benign neoplasm of cecum: Secondary | ICD-10-CM | POA: Diagnosis not present

## 2020-03-02 DIAGNOSIS — D122 Benign neoplasm of ascending colon: Secondary | ICD-10-CM

## 2020-03-02 DIAGNOSIS — D123 Benign neoplasm of transverse colon: Secondary | ICD-10-CM

## 2020-03-02 DIAGNOSIS — I10 Essential (primary) hypertension: Secondary | ICD-10-CM | POA: Diagnosis not present

## 2020-03-02 MED ORDER — SODIUM CHLORIDE 0.9 % IV SOLN: 500.0000 mL | Freq: Once | INTRAVENOUS | Status: DC

## 2020-03-02 NOTE — Progress Notes (Signed)
A and O x3. Report to RN. Tolerated MAC anesthesia well.

## 2020-03-02 NOTE — Progress Notes (Signed)
Called to room to assist during endoscopic procedure.  Patient ID and intended procedure confirmed with present staff. Received instructions for my participation in the procedure from the performing physician.  

## 2020-03-02 NOTE — Progress Notes (Signed)
TEMP-JB  V/S-CW  Made Dr. And CRNA aware that pt. Drunk water at 8:45 am.

## 2020-03-02 NOTE — Op Note (Addendum)
[facility] Patient Name: [name] Procedure Date: 03/02/2020 10:44 AM MRN: [id number] Endoscopist: Ladene Artist , MD Age: 74 Referring MD:  Date of Birth: Jul 27, 1946 Gender: Female Account #: [id number] Procedure:                Colonoscopy Indications:              Surveillance: Personal history of adenomatous                            polyps on last colonoscopy > 5 years ago. Family                            history of colon cancer. Medicines:                Monitored Anesthesia Care Procedure:                Pre-Anesthesia Assessment:                           - Prior to the procedure, a History and Physical                            was performed, and patient medications and                            allergies were reviewed. The patient's tolerance of                            previous anesthesia was also reviewed. The risks                            and benefits of the procedure and the sedation                            options and risks were discussed with the patient.                            All questions were answered, and informed consent                            was obtained. Prior Anticoagulants: The patient has                            taken Plavix (clopidogrel), last dose was 5 days                            prior to procedure. ASA Grade Assessment: II - A                            patient with mild systemic disease. After reviewing                            the risks and benefits, the patient was deemed in  satisfactory condition to undergo the procedure.                           After obtaining informed consent, the colonoscope                            was passed under direct vision. Throughout the                            procedure, the patient's blood pressure, pulse, and                            oxygen saturations were monitored continuously. The                            Colonoscope was  introduced through the anus and                            advanced to the the cecum, identified by                            appendiceal orifice and ileocecal valve. The                            ileocecal valve, appendiceal orifice, and rectum                            were photographed. The quality of the bowel                            preparation was good. The colonoscopy was performed                            without difficulty. The patient tolerated the                            procedure well. Scope In: 10:51:33 AM Scope Out: 11:16:50 AM Total Procedure Duration: 0 hours 25 minutes 17 seconds  Findings:                 The perianal and digital rectal examinations were                            normal.                           Three sessile polyps were found in the descending                            colon, ascending colon and cecum. The polyps were 7                            to 8 mm in size. These polyps were removed with a  cold snare. Resection and retrieval were complete.                           A 4 mm polyp was found in the transverse colon. The                            polyp was sessile. The polyp was removed with a                            cold biopsy forceps. Resection and retrieval were                            complete.                           Scattered small-mouthed diverticula were found in                            the right colon. There was no evidence of                            diverticular bleeding.                           External hemorrhoids were found during retroflexion.                           The exam was otherwise without abnormality on                            direct and retroflexion views. Complications:            No immediate complications. Estimated blood loss:                            None. Estimated Blood Loss:     Estimated blood loss: none. Impression:               - Three 7 to 8 mm  polyps in the descending colon,                            in the ascending colon and in the cecum, removed                            with a cold snare. Resected and retrieved.                           - One 4 mm polyp in the transverse colon, removed                            with a cold biopsy forceps. Resected and retrieved.                           - Mild diverticulosis in the right colon.                           -  External hemorrhoids.                           - The examination was otherwise normal on direct                            and retroflexion views. Recommendation:           - Repeat colonoscopy, likely in 3 years, for                            surveillance based on pathology results.                           - Resume Plavix (clopidogrel) in 2 days at prior                            dose. Refer to managing physician for further                            adjustment of therapy.                           - Patient has a contact number available for                            emergencies. The signs and symptoms of potential                            delayed complications were discussed with the                            patient. Return to normal activities tomorrow.                            Written discharge instructions were provided to the                            patient.                           - Resume previous diet.                           - Continue present medications.                           - Await pathology results. Ladene Artist, MD 03/02/2020 11:21:46 AM This report has been signed electronically.

## 2020-03-02 NOTE — Patient Instructions (Signed)
Please read handouts provided. Continue present medications. Await pathology results. Resume Plavix ( clopidogrel ) in 2 days at prior dose.      YOU HAD AN ENDOSCOPIC PROCEDURE TODAY AT THE  ENDOSCOPY CENTER:   Refer to the procedure report that was given to you for any specific questions about what was found during the examination.  If the procedure report does not answer your questions, please call your gastroenterologist to clarify.  If you requested that your care partner not be given the details of your procedure findings, then the procedure report has been included in a sealed envelope for you to review at your convenience later.  YOU SHOULD EXPECT: Some feelings of bloating in the abdomen. Passage of more gas than usual.  Walking can help get rid of the air that was put into your GI tract during the procedure and reduce the bloating. If you had a lower endoscopy (such as a colonoscopy or flexible sigmoidoscopy) you may notice spotting of blood in your stool or on the toilet paper. If you underwent a bowel prep for your procedure, you may not have a normal bowel movement for a few days.  Please Note:  You might notice some irritation and congestion in your nose or some drainage.  This is from the oxygen used during your procedure.  There is no need for concern and it should clear up in a day or so.  SYMPTOMS TO REPORT IMMEDIATELY:   Following lower endoscopy (colonoscopy or flexible sigmoidoscopy):  Excessive amounts of blood in the stool  Significant tenderness or worsening of abdominal pains  Swelling of the abdomen that is new, acute  Fever of 100F or higher   For urgent or emergent issues, a gastroenterologist can be reached at any hour by calling [phone]. Do not use MyChart messaging for urgent concerns.    DIET:  We do recommend a small meal at first, but then you may proceed to your regular diet.  Drink plenty of fluids but you should avoid alcoholic  beverages for 24 hours.  ACTIVITY:  You should plan to take it easy for the rest of today and you should NOT DRIVE or use heavy machinery until tomorrow (because of the sedation medicines used during the test).    FOLLOW UP: Our staff will call the number listed on your records 48-72 hours following your procedure to check on you and address any questions or concerns that you may have regarding the information given to you following your procedure. If we do not reach you, we will leave a message.  We will attempt to reach you two times.  During this call, we will ask if you have developed any symptoms of COVID 19. If you develop any symptoms (ie: fever, flu-like symptoms, shortness of breath, cough etc.) before then, please call [phone].  If you test positive for Covid 19 in the 2 weeks post procedure, please call and report this information to us.    If any biopsies were taken you will be contacted by phone or by letter within the next 1-3 weeks.  Please call us at [phone] if you have not heard about the biopsies in 3 weeks.    SIGNATURES/CONFIDENTIALITY: You and/or your care partner have signed paperwork which will be entered into your electronic medical record.  These signatures attest to the fact that that the information above on your After Visit Summary has been reviewed and is understood.  Full responsibility of the confidentiality of this   this discharge information lies with you and/or your care-partner.

## 2020-03-04 ENCOUNTER — Telehealth: Payer: Self-pay | Admitting: *Deleted

## 2020-03-04 NOTE — Telephone Encounter (Signed)
  Follow up Call-  Call back number 03/02/2020  Post procedure Call Back phone  # [phone]  Permission to leave phone message Yes  Some recent data might be hidden     Patient questions:  Do you have a fever, pain , or abdominal swelling? No. Pain Score  0 *  Have you tolerated food without any problems? Yes.    Have you been able to return to your normal activities? Yes.    Do you have any questions about your discharge instructions: Diet   No. Medications  No. Follow up visit  No.  Do you have questions or concerns about your Care? No.  Actions: * If pain score is 4 or above: No action needed, pain <4.  1. Have you developed a fever since your procedure? no  2.   Have you had an respiratory symptoms (SOB or cough) since your procedure? no  3.   Have you tested positive for COVID 19 since your procedure no  4.   Have you had any family members/close contacts diagnosed with the COVID 19 since your procedure?  no   If yes to any of these questions please route to Joylene John, RN and Alphonsa Gin, Therapist, sports.

## 2020-03-07 ENCOUNTER — Telehealth: Payer: Self-pay | Admitting: Family Medicine

## 2020-03-07 DIAGNOSIS — K2100 Gastro-esophageal reflux disease with esophagitis, without bleeding: Secondary | ICD-10-CM

## 2020-03-07 DIAGNOSIS — R079 Chest pain, unspecified: Secondary | ICD-10-CM

## 2020-03-07 NOTE — Telephone Encounter (Addendum)
Patient is requesting that all her medication  goes to [facility], Patient is requesting a refill or prescriptions for all medications sent to new pharmacy.  Patient's account number for [facility] # [id number]

## 2020-03-08 MED ORDER — PANTOPRAZOLE SODIUM 40 MG PO TBEC: 40.0000 mg | DELAYED_RELEASE_TABLET | Freq: Every day | ORAL | 3 refills | Status: DC

## 2020-03-08 MED ORDER — NITROGLYCERIN 0.4 MG SL SUBL: 0.4000 mg | SUBLINGUAL_TABLET | SUBLINGUAL | 3 refills | Status: DC | PRN

## 2020-03-08 NOTE — Telephone Encounter (Signed)
Will send medications that Dr. Charlett Blake prescribe to patient, she will need to contact cardiology as well to have them send prescriptions Called patient left voicemail for patient to call the office back

## 2020-03-10 ENCOUNTER — Encounter: Payer: Self-pay | Admitting: Gastroenterology

## 2020-03-14 ENCOUNTER — Ambulatory Visit (INDEPENDENT_AMBULATORY_CARE_PROVIDER_SITE_OTHER): Payer: Medicare Other | Admitting: Internal Medicine

## 2020-03-14 ENCOUNTER — Ambulatory Visit: Payer: Medicare Other | Admitting: Family Medicine

## 2020-03-14 ENCOUNTER — Other Ambulatory Visit: Payer: Self-pay

## 2020-03-14 VITALS — BP 140/72 | HR 61 | Ht 64.0 in | Wt 204.2 lb

## 2020-03-14 DIAGNOSIS — E876 Hypokalemia: Secondary | ICD-10-CM | POA: Diagnosis not present

## 2020-03-14 DIAGNOSIS — R0600 Dyspnea, unspecified: Secondary | ICD-10-CM

## 2020-03-14 DIAGNOSIS — I10 Essential (primary) hypertension: Secondary | ICD-10-CM

## 2020-03-14 DIAGNOSIS — I2510 Atherosclerotic heart disease of native coronary artery without angina pectoris: Secondary | ICD-10-CM

## 2020-03-14 DIAGNOSIS — E559 Vitamin D deficiency, unspecified: Secondary | ICD-10-CM | POA: Diagnosis not present

## 2020-03-14 DIAGNOSIS — E039 Hypothyroidism, unspecified: Secondary | ICD-10-CM | POA: Diagnosis not present

## 2020-03-14 DIAGNOSIS — K2100 Gastro-esophageal reflux disease with esophagitis, without bleeding: Secondary | ICD-10-CM | POA: Diagnosis not present

## 2020-03-14 DIAGNOSIS — R0609 Other forms of dyspnea: Secondary | ICD-10-CM

## 2020-03-14 MED ORDER — BISOPROLOL FUMARATE 5 MG PO TABS: 5.0000 mg | ORAL_TABLET | Freq: Every day | ORAL | 3 refills | Status: DC

## 2020-03-14 MED ORDER — REPATHA SURECLICK 140 MG/ML ~~LOC~~ SOAJ: 1.0000 "pen " | SUBCUTANEOUS | 11 refills | Status: DC

## 2020-03-14 MED ORDER — CLOPIDOGREL BISULFATE 75 MG PO TABS: ORAL_TABLET | ORAL | 3 refills | Status: DC

## 2020-03-14 MED ORDER — ISOSORBIDE MONONITRATE ER 60 MG PO TB24: 60.0000 mg | ORAL_TABLET | Freq: Every day | ORAL | 3 refills | Status: DC

## 2020-03-14 MED ORDER — ROSUVASTATIN CALCIUM 40 MG PO TABS: ORAL_TABLET | ORAL | 3 refills | Status: DC

## 2020-03-14 MED ORDER — AMLODIPINE BESYLATE 10 MG PO TABS: 10.0000 mg | ORAL_TABLET | Freq: Every day | ORAL | 3 refills | Status: DC

## 2020-03-14 MED ORDER — PANTOPRAZOLE SODIUM 40 MG PO TBEC: 40.0000 mg | DELAYED_RELEASE_TABLET | Freq: Every day | ORAL | 3 refills | Status: DC

## 2020-03-14 NOTE — Patient Instructions (Signed)
Medication Instructions:  No changes *If you need a refill on your cardiac medications before your next appointment, please call your pharmacy*   Lab Work: Today: bmet, bnp, vit d, cbc If you have labs (blood work) drawn today and your tests are completely normal, you will receive your results only by: Marland Kitchen MyChart Message (if you have MyChart) OR . A paper copy in the mail If you have any lab test that is abnormal or we need to change your treatment, we will call you to review the results.   Testing/Procedures: none   Follow-Up: At [facility], you and your health needs are our priority.  As part of our continuing mission to provide you with exceptional heart care, we have created designated Provider Care Teams.  These Care Teams include your primary Cardiologist (physician) and Advanced Practice Providers (APPs -  Physician Assistants and Nurse Practitioners) who all work together to provide you with the care you need, when you need it.  We recommend signing up for the patient portal called "MyChart".  Sign up information is provided on this After Visit Summary.  MyChart is used to connect with patients for Virtual Visits (Telemedicine).  Patients are able to view lab/test results, encounter notes, upcoming appointments, etc.  Non-urgent messages can be sent to your provider as well.   To learn more about what you can do with MyChart, go to [email or web address].    Your next appointment:   6 month(s)  The format for your next appointment:   In Person  Provider:   Dorris Carnes, MD   Other Instructions

## 2020-03-14 NOTE — Progress Notes (Signed)
Cardiology Office Note   Date:  03/14/2020   ID:  [name], DOB [birth year], MRN [id number]  PCP:  Mosie Lukes, MD  Cardiologist:   Dorris Carnes, MD   F/U of CAD    History of Present Illness:  [name] is a 74 y.o. female with CAD - s/p NSTEMI 10/2011 with DESx2  Other issues include thyroidectomy, GERD, HTN, HLD, vertigo.Last myovue showed inferolateral scar with minimal periinfarct ischemia The pt was seen in ED for CP in December 2019   L heart cath in Dec 2019 showed:  LAD with 25%  OM  with 100% with Left to R  collaterals  RCA with patent stent   Normal LVEF       Echo in early Sept 2020  LVEF and RVEF were normal   Mild diastolic dysfunction     I saw the pt back in October     Since seen she denies SOB   She has occasional L sided CP that is tight, last seconds  L upper chest  She denies abdominal pains  She and her husband both have had COVID vaccines    Current Meds  Medication Sig  . ALPRAZolam (XANAX) 0.25 MG tablet Take 1 tablet (0.25 mg total) by mouth 2 (two) times daily as needed for anxiety.  Marland Kitchen amLODipine (NORVASC) 10 MG tablet Take 1 tablet (10 mg total) by mouth daily.  Marland Kitchen aspirin EC 81 MG tablet Take 81 mg by mouth daily.    . B Complex-C (B-COMPLEX WITH VITAMIN C) tablet Take 1 tablet by mouth daily.  . bisoprolol (ZEBETA) 5 MG tablet Take 1 tablet (5 mg total) by mouth daily.  . calcitRIOL (ROCALTROL) 0.25 MCG capsule TAKE 3 CAPSULES(0.75 MCG) BY MOUTH DAILY  . cholecalciferol (VITAMIN D) 1000 UNITS tablet Take 1,000 Units by mouth daily.  . clopidogrel (PLAVIX) 75 MG tablet TAKE 1 TABLET(75 MG) BY MOUTH DAILY  . Evolocumab (REPATHA SURECLICK) [id number] MG/ML SOAJ Inject 1 pen into the skin every 14 (fourteen) days.  . isosorbide mononitrate (IMDUR) 60 MG 24 hr tablet Take 1 tablet (60 mg total) by mouth daily.  Marland Kitchen ketoconazole (NIZORAL) 2 % cream Apply 1 fingertip amount to each foot daily.  Marland Kitchen levothyroxine (SYNTHROID) 112 MCG tablet  Take 1 tablet (112 mcg total) by mouth daily.  . Multiple Vitamin (MULTIVITAMIN WITH MINERALS) TABS tablet Take 1 tablet by mouth daily.  . nitroGLYCERIN (NITROSTAT) 0.4 MG SL tablet Place 1 tablet (0.4 mg total) under the tongue every 5 (five) minutes as needed for chest pain.  . pantoprazole (PROTONIX) 40 MG tablet Take 1 tablet (40 mg total) by mouth daily.  . rosuvastatin (CRESTOR) 40 MG tablet TAKE 1 TABLET(40 MG) BY MOUTH DAILY  . traMADol (ULTRAM) 50 MG tablet tramadol 50 mg tablet  TK 1 T PO TID PRN     Allergies:   Zetia [ezetimibe]   Past Medical History:  Diagnosis Date  . Acute MI, subendocardial ([place])   . Arthritis   . Back pain 05/31/2013  . Benign paroxysmal positional vertigo 08/05/2016  . CAD (coronary artery disease)    a. NSTEMI 10/2011: [place] 11/19/11: pLAD 30%, oOM 60%, AVCFX 30%, CFX after OM2 30%, pRCA 60 and 70%, then 99%, AM 80-90% with TIMI 3 flow.  PCI: Promus DES x 2 to RCA; b. 06/2012 Cath: patent RCA stents w/ subtl occl of Acute Marginal (jailed)->Med rx; c. 05/2015 MV: EF 59%, mod mid infsept/inf/ap lat/ap infarct with  peri-infarct isch-->Med Rx; d. 02/2016 Cath: LM nl, LAD 25m, RI 50, RCA patent stents.  . Colitis   . Facial skin lesion 01/28/2017  . GERD (gastroesophageal reflux disease)    occasional  . Hyperlipidemia   . Hypertension   . Hypertensive heart disease    a. Echocardiogram 11/19/11: Difficult acoustic windows, EF 60-65%, normal LV wall thickness, grade 1 diastolic dysfunction  . Hypoparathyroidism ([place])   . Low zinc level 11/26/2016  . Multinodular thyroid    Goiter s/p thyroidectomy in 2007 with post-op  hypocalcemia and post-op hypothyroidism/hypoparathyroidism with hypocalcemia  . Neck pain on right side 07/13/2013  . Nocturia 05/31/2013  . Pain in right axilla 03/13/2016  . Palpitations    a. 03/2012 - patient set up for event monitor but did not wear correctly -  she declined wearing a repeat monitor  . Post-surgical hypothyroidism   .  Sun-damaged skin 12/05/2014  . Tubular adenoma of colon 06/2011  . Unspecified constipation 05/31/2013  . Vertigo   . Zinc deficiency 11/26/2015    Past Surgical History:  Procedure Laterality Date  . CARDIAC CATHETERIZATION N/A 03/08/2016   Procedure: Left Heart Cath and Coronary Angiography;  Surgeon: Jettie Booze, MD;  Location: [facility];  Service: Cardiovascular;  Laterality: N/A;  . COLONOSCOPY  Aenomatous polyps   07/05/2011  . COLONOSCOPY N/A 09/28/2014   Procedure: COLONOSCOPY;  Surgeon: Ladene Artist, MD;  [facility];  Service: Endoscopy;  Laterality: N/A;  . CORONARY ANGIOPLASTY WITH STENT PLACEMENT    . LEFT HEART CATH AND CORONARY ANGIOGRAPHY N/A 12/09/2018   Procedure: LEFT HEART CATH AND CORONARY ANGIOGRAPHY;  Surgeon: Jettie Booze, MD;  Location: [facility];  Service: Cardiovascular;  Laterality: N/A;  . LEFT HEART CATHETERIZATION WITH CORONARY ANGIOGRAM N/A 07/17/2012   Procedure: LEFT HEART CATHETERIZATION WITH CORONARY ANGIOGRAM;  Surgeon: Hillary Bow, MD;  Location: [facility];  Service: Cardiovascular;  Laterality: N/A;  . PERCUTANEOUS CORONARY STENT INTERVENTION (PCI-S) N/A 11/19/2011   Procedure: PERCUTANEOUS CORONARY STENT INTERVENTION (PCI-S);  Surgeon: Hillary Bow, MD;  Location: [facility] CATH LAB;  Service: Cardiovascular;  Laterality: N/A;  . POLYPECTOMY    . SHOULDER ARTHROSCOPY W/ ROTATOR CUFF REPAIR     right  . TOTAL THYROIDECTOMY  2007   GOITER     Social History:  The patient  reports that she has never smoked. She has never used smokeless tobacco. She reports current alcohol use. She reports that she does not use drugs.   Family History:  The patient's family history includes Blindness in her sister; Cancer in her brother and brother; Colon cancer in her brother; Congestive Heart Failure in her sister; Healthy in her daughter, daughter, daughter, and son; Heart attack in her father; Heart disease in her  father; Hypertension in her father and sister; Thyroid disease in her sister.    ROS:  Please see the history of present illness. All other systems are reviewed and  Negative to the above problem except as noted.    PHYSICAL EXAM: VS:  BP 140/72   Pulse 61   Ht 5\' 4"  (1.626 m)   Wt 204 lb 3.2 oz (92.6 kg)   BMI 35.05 kg/m   GEN: Well nourished, well developed, in no acute distress  HEENT: normal  Neck: JVP is normal  No carotid bruits Cardiac: RRR; no murmurs No LE e dema  Respiratory:  clear to auscultation bilaterally, normal work of breathing GI: soft, nontender,  nondistended, + BS  No hepatomegaly  MS: no deformity Moving all extremities   Skin: warm and dry, no rash Neuro:  Strength and sensation are intact Psych: euthymic mood, full affect   EKG:  EKG is  ordered today.  SR 61 bpm  RBBB  Occasional PVC   CATH  2019     Previously placed Prox RCA to Dist RCA stent (unknown type) is widely patent.  Acute Mrg lesion is 100% stenosed. Left to right collaterals.  Ost Ramus lesion is 40% stenosed.  Prox LAD lesion is 25% stenosed.  Ost 1st Diag lesion is 25% stenosed.  The left ventricular systolic function is normal.  LV end diastolic pressure is normal.  The left ventricular ejection fraction is 55-65% by visual estimate.  There is no aortic valve stenosis.   No change in coronary anatomy from 2017.  Continue medical therapy.  Lipid Panel    Component Value Date/Time   CHOL 102 10/12/2019 1020   CHOL 101 10/07/2018 1435   TRIG 224.0 (H) 10/12/2019 1020   HDL 38.90 (L) 10/12/2019 1020   HDL 48 10/07/2018 1435   CHOLHDL 3 10/12/2019 1020   VLDL 44.8 (H) 10/12/2019 1020   LDLCALC 62 04/23/2019 1314   LDLCALC 24 10/07/2018 1435   LDLDIRECT 33.0 10/12/2019 1020      Wt Readings from Last 3 Encounters:  03/14/20 204 lb 3.2 oz (92.6 kg)  03/02/20 202 lb (91.6 kg)  01/11/20 208 lb 12.8 oz (94.7 kg)      ASSESSMENT AND PLAN:  1  CAD   Doing good  No  anginal symtpoms   Brief CP does not appear to be angina     2   HTN   BP is OK  Keep on current regine    3  HL   HL   Keep on PCSK9I  And statin  LDL is excellent    Trig go up and down depending on diet, carbs   Eat more vegetables  4  CV dz  Mild   F/U in 6 months    Current medicines are reviewed at length with the patient today.  The patient does not have concerns regarding medicines.  Signed, Dorris Carnes, MD  03/14/2020 4:36 PM    [facility] [place], [place], [place]  [address] Phone: [phone]; Fax: [phone]

## 2020-03-15 LAB — CBC
Hematocrit: 42.5 % (ref 34.0–46.6)
Hemoglobin: 14.2 g/dL (ref 11.1–15.9)
MCH: 28.7 pg (ref 26.6–33.0)
MCHC: 33.4 g/dL (ref 31.5–35.7)
MCV: 86 fL (ref 79–97)
Platelets: 297 10*3/uL (ref 150–450)
RBC: 4.94 x10E6/uL (ref 3.77–5.28)
RDW: 13.5 % (ref 11.7–15.4)
WBC: 10.2 10*3/uL (ref 3.4–10.8)

## 2020-03-15 LAB — BASIC METABOLIC PANEL
BUN/Creatinine Ratio: 21 (ref 12–28)
BUN: 24 mg/dL (ref 8–27)
CO2: 27 mmol/L (ref 20–29)
Calcium: 9.9 mg/dL (ref 8.7–10.3)
Chloride: 103 mmol/L (ref 96–106)
Creatinine, Ser: 1.13 mg/dL — ABNORMAL HIGH (ref 0.57–1.00)
GFR calc Af Amer: 56 mL/min/{1.73_m2} — ABNORMAL LOW (ref >59)
GFR calc non Af Amer: 48 mL/min/{1.73_m2} — ABNORMAL LOW (ref >59)
Glucose: 124 mg/dL — ABNORMAL HIGH (ref 65–99)
Potassium: 4.6 mmol/L (ref 3.5–5.2)
Sodium: 147 mmol/L — ABNORMAL HIGH (ref 134–144)

## 2020-03-15 LAB — PRO B NATRIURETIC PEPTIDE: NT-Pro BNP: 401 pg/mL — ABNORMAL HIGH (ref 0–301)

## 2020-03-15 LAB — VITAMIN D 25 HYDROXY (VIT D DEFICIENCY, FRACTURES): Vit D, 25-Hydroxy: 35.5 ng/mL (ref 30.0–100.0)

## 2020-03-16 ENCOUNTER — Telehealth: Payer: Self-pay | Admitting: *Deleted

## 2020-03-16 MED ORDER — VITAMIN D-3 25 MCG (1000 UT) PO CAPS: ORAL_CAPSULE | ORAL | 0 refills | Status: DC

## 2020-03-16 MED ORDER — FUROSEMIDE 20 MG PO TABS: ORAL_TABLET | ORAL | 3 refills | Status: DC

## 2020-03-16 NOTE — Telephone Encounter (Signed)
-----   Message from Fay Records, MD sent at 03/16/2020 11:01 AM EDT ----- CBC is normal  ELectrolytesa are OK  Sodium up minimally    Fluid is up minimally   She did not appear volume overloaded on exam Can give Rx of lasix 20 mg  Take 1x per week as needed   Follow wts Vit D level is low normal at 35   I would increase to 3000-4000 per day

## 2020-03-16 NOTE — Telephone Encounter (Signed)
I spoke with patient and reviewed results/recommendations with her. Will send lasix prescription to [facility].  Patient requests lab results be mailed to her.

## 2020-03-16 NOTE — Telephone Encounter (Signed)
Copy of recent labs placed in outgoing mail per patient request.

## 2020-03-22 DIAGNOSIS — H02831 Dermatochalasis of right upper eyelid: Secondary | ICD-10-CM | POA: Diagnosis not present

## 2020-03-22 DIAGNOSIS — H02834 Dermatochalasis of left upper eyelid: Secondary | ICD-10-CM | POA: Diagnosis not present

## 2020-03-22 DIAGNOSIS — H04123 Dry eye syndrome of bilateral lacrimal glands: Secondary | ICD-10-CM | POA: Diagnosis not present

## 2020-03-22 DIAGNOSIS — H25813 Combined forms of age-related cataract, bilateral: Secondary | ICD-10-CM | POA: Diagnosis not present

## 2020-04-01 DIAGNOSIS — H02423 Myogenic ptosis of bilateral eyelids: Secondary | ICD-10-CM | POA: Diagnosis not present

## 2020-04-01 DIAGNOSIS — H02413 Mechanical ptosis of bilateral eyelids: Secondary | ICD-10-CM | POA: Diagnosis not present

## 2020-04-01 DIAGNOSIS — D485 Neoplasm of uncertain behavior of skin: Secondary | ICD-10-CM | POA: Diagnosis not present

## 2020-04-01 DIAGNOSIS — H02834 Dermatochalasis of left upper eyelid: Secondary | ICD-10-CM | POA: Diagnosis not present

## 2020-04-01 DIAGNOSIS — H02831 Dermatochalasis of right upper eyelid: Secondary | ICD-10-CM | POA: Diagnosis not present

## 2020-04-07 ENCOUNTER — Ambulatory Visit (INDEPENDENT_AMBULATORY_CARE_PROVIDER_SITE_OTHER): Payer: Medicare Other | Admitting: Podiatry

## 2020-04-07 ENCOUNTER — Other Ambulatory Visit: Payer: Self-pay

## 2020-04-07 DIAGNOSIS — B351 Tinea unguium: Secondary | ICD-10-CM

## 2020-04-07 DIAGNOSIS — E1151 Type 2 diabetes mellitus with diabetic peripheral angiopathy without gangrene: Secondary | ICD-10-CM

## 2020-04-07 DIAGNOSIS — E1169 Type 2 diabetes mellitus with other specified complication: Secondary | ICD-10-CM | POA: Diagnosis not present

## 2020-04-07 DIAGNOSIS — H53483 Generalized contraction of visual field, bilateral: Secondary | ICD-10-CM | POA: Diagnosis not present

## 2020-04-07 NOTE — Progress Notes (Signed)
  Subjective:  Patient ID: [name], adult    DOB: 1946/07/19,  MRN: [id number]  Chief Complaint  Patient presents with  . Nail Problem    Nail trim 1-5 bilateral  . Callouses    Bilateral callous trim    74 y.o. adult presents with the above complaint. History confirmed with patient.  Objective:  Physical Exam: warm, good capillary refill, nail exam onychomycosis of the toenails, no trophic changes or ulcerative lesions, normal DP, non-palp PT pulses, and normal sensory exam. Left Foot: normal exam, no swelling, tenderness, instability; ligaments intact, full range of motion of all ankle/foot joints  Right Foot: normal exam, no swelling, tenderness, instability; ligaments intact, full range of motion of all ankle/foot joints   No images are attached to the encounter.  Assessment:   1. Onychomycosis of multiple toenails with type 2 diabetes mellitus and peripheral angiopathy ([name])    Plan:  Patient was evaluated and treated and all questions answered.  Onychomycosis, Diabetes and PAD -Patient is diabetic with a qualifying condition for at risk foot care.   Procedure: Nail Debridement Rationale: Patient meets criteria for routine foot care due to PAD Type of Debridement: manual, sharp debridement. Instrumentation: Nail nipper, rotary burr. Number of Nails: 10    No follow-ups on file.

## 2020-04-12 NOTE — Progress Notes (Signed)
Office Visit Note  Patient: [name]             Date of Birth: October 07, 1946           MRN: [id number]             PCP: Mosie Lukes, MD Referring: Mosie Lukes, MD Visit Date: 04/19/2020 Occupation: @GUAROCC @  Subjective:  Osteoarthritis (Doing good, bil hip pain, bil knee pain, bil hand pain)   History of Present Illness: [name] is a 74 y.o. adult with history of osteoarthritis and positive ANA.  She states she continues to have some discomfort in her joints.  She states she has lower back pain but she does not want to have cortisone injections.  She continues to have some discomfort in her bilateral hips, bilateral knee joints in her hands.  She has some morning stiffness.  She denies any joint swelling.  There is no history of oral ulcers, nasal ulcers, malar rash, photosensitivity, Raynaud's phenomenon, sicca symptoms or inflammatory arthritis.  Activities of Daily Living:  Patient reports morning stiffness for 5 minutes.   Patient Reports nocturnal pain.  Difficulty dressing/grooming: Denies Difficulty climbing stairs: Reports Difficulty getting out of chair: Denies Difficulty using hands for taps, buttons, cutlery, and/or writing: Reports  Review of Systems  Constitutional: Positive for fatigue. Negative for night sweats, weight gain and weight loss.  HENT: Positive for mouth dryness. Negative for mouth sores, trouble swallowing, trouble swallowing and nose dryness.   Eyes: Positive for dryness. Negative for pain, redness and visual disturbance.  Respiratory: Negative for cough, shortness of breath and difficulty breathing.   Cardiovascular: Negative for chest pain, palpitations, hypertension, irregular heartbeat and swelling in legs/feet.  Gastrointestinal: Negative for blood in stool, constipation and diarrhea.  Endocrine: Negative for increased urination.  Genitourinary: Negative for difficulty urinating and vaginal dryness.  Musculoskeletal:  Positive for arthralgias, joint pain, morning stiffness and muscle tenderness. Negative for joint swelling, myalgias, muscle weakness and myalgias.  Skin: Negative for color change, rash, hair loss, skin tightness, ulcers and sensitivity to sunlight.  Allergic/Immunologic: Negative for susceptible to infections.  Neurological: Negative for dizziness, memory loss, night sweats and weakness.  Hematological: Negative for bruising/bleeding tendency and swollen glands.  Psychiatric/Behavioral: Negative for depressed mood and sleep disturbance. The patient is not nervous/anxious.     PMFS History:  Patient Active Problem List   Diagnosis Date Noted  . Breast pain, left 11/09/2019  . Arm lesion 11/09/2019  . Hypoglycemia 10/07/2019  . Heart disease 01/29/2019  . Sensorineural hearing loss (SNHL) of both ears 01/27/2019  . Pulmonary congestion 01/19/2019  . Primary osteoarthritis of both knees 12/19/2018  . DDD (degenerative disc disease), lumbar 12/19/2018  . ANA positive 10/27/2018  . CAP (community acquired pneumonia) 03/06/2018  . Lower extremity pain, lateral, right 02/17/2018  . Post-menopausal bleeding 02/17/2018  . Cephalalgia 02/17/2018  . Anxiety 02/17/2018  . Peripheral arterial disease ([place]) 02/20/2017  . Facial skin lesion 01/28/2017  . Benign paroxysmal positional vertigo 08/05/2016  . Atypical chest pain 03/27/2016  . Antiplatelet or antithrombotic long-term use 03/27/2016  . Pain in right axilla 03/13/2016  . Chest pain 03/07/2016  . Right lumbar radiculopathy 02/21/2016  . Abnormality of gait 02/21/2016  . Zinc deficiency 11/26/2015  . Chronic low back pain 10/26/2015  . Right hip pain 10/26/2015  . Medicare annual wellness visit, subsequent 03/13/2015  . Preventative health care 03/13/2015  . Sun-damaged skin 12/05/2014  . Angina of effort ([place])  12/02/2014  . Hx of colonic polyps 09/28/2014  . Benign neoplasm of descending colon 09/28/2014  . Other fatigue  09/10/2014  . Personal history of colonic polyps 07/16/2014  . Bilateral hand pain 03/20/2014  . Postsurgical hypoparathyroidism ([place]) 08/03/2013  . Neck pain on right side 07/13/2013  . Constipation 05/31/2013  . Back pain 05/31/2013  . Nocturia 05/31/2013  . GERD (gastroesophageal reflux disease) 11/02/2012  . Tension headache 11/02/2012  . Anemia 06/11/2012  . Hypokalemia 06/11/2012  . Depression 01/21/2012  . Headache(784.0) 12/23/2011  . Bradycardia 12/07/2011  . Hypocalcemia 12/07/2011  . Acute MI, subendocardial ([place]) 11/20/2011  . Hypothyroidism 05/20/2009  . Essential hypertension, benign 05/20/2009  . Hyperlipidemia 03/08/2009  . CAD, NATIVE VESSEL 03/08/2009    Past Medical History:  Diagnosis Date  . Acute MI, subendocardial ([place])   . Arthritis   . Back pain 05/31/2013  . Benign paroxysmal positional vertigo 08/05/2016  . CAD (coronary artery disease)    a. NSTEMI 10/2011: [place] 11/19/11: pLAD 30%, oOM 60%, AVCFX 30%, CFX after OM2 30%, pRCA 60 and 70%, then 99%, AM 80-90% with TIMI 3 flow.  PCI: Promus DES x 2 to RCA; b. 06/2012 Cath: patent RCA stents w/ subtl occl of Acute Marginal (jailed)->Med rx; c. 05/2015 MV: EF 59%, mod mid infsept/inf/ap lat/ap infarct with peri-infarct isch-->Med Rx; d. 02/2016 Cath: LM nl, LAD 41m, RI 50, RCA patent stents.  . Colitis   . Facial skin lesion 01/28/2017  . GERD (gastroesophageal reflux disease)    occasional  . Hyperlipidemia   . Hypertension   . Hypertensive heart disease    a. Echocardiogram 11/19/11: Difficult acoustic windows, EF 60-65%, normal LV wall thickness, grade 1 diastolic dysfunction  . Hypoparathyroidism ([place])   . Low zinc level 11/26/2016  . Multinodular thyroid    Goiter s/p thyroidectomy in 2007 with post-op  hypocalcemia and post-op hypothyroidism/hypoparathyroidism with hypocalcemia  . Neck pain on right side 07/13/2013  . Nocturia 05/31/2013  . Osteoarthritis   . Pain in right axilla 03/13/2016  . Palpitations     a. 03/2012 - patient set up for event monitor but did not wear correctly -  she declined wearing a repeat monitor  . Post-surgical hypothyroidism   . Sun-damaged skin 12/05/2014  . Tubular adenoma of colon 06/2011  . Unspecified constipation 05/31/2013  . Vertigo   . Zinc deficiency 11/26/2015    Family History  Problem Relation Age of Onset  . Colon cancer Brother   . Cancer Brother        COLON  . Hypertension Father   . Heart disease Father   . Heart attack Father   . Blindness Sister   . Congestive Heart Failure Sister   . Hypertension Sister   . Healthy Daughter   . Healthy Son   . Thyroid disease Sister   . Cancer Brother        multiple myelomas  . Healthy Daughter   . Healthy Daughter   . Diabetes Neg Hx   . Prostate cancer Neg Hx   . Breast cancer Neg Hx   . Esophageal cancer Neg Hx   . Rectal cancer Neg Hx   . Stomach cancer Neg Hx    Past Surgical History:  Procedure Laterality Date  . CARDIAC CATHETERIZATION N/A 03/08/2016   Procedure: Left Heart Cath and Coronary Angiography;  Surgeon: Jettie Booze, MD;  Location: [facility];  Service: Cardiovascular;  Laterality: N/A;  . COLONOSCOPY  Aenomatous polyps  07/05/2011  . COLONOSCOPY N/A 09/28/2014   Procedure: COLONOSCOPY;  Surgeon: Ladene Artist, MD;  [facility];  Service: Endoscopy;  Laterality: N/A;  . CORONARY ANGIOPLASTY WITH STENT PLACEMENT    . LEFT HEART CATH AND CORONARY ANGIOGRAPHY N/A 12/09/2018   Procedure: LEFT HEART CATH AND CORONARY ANGIOGRAPHY;  Surgeon: Jettie Booze, MD;  Location: [facility];  Service: Cardiovascular;  Laterality: N/A;  . LEFT HEART CATHETERIZATION WITH CORONARY ANGIOGRAM N/A 07/17/2012   Procedure: LEFT HEART CATHETERIZATION WITH CORONARY ANGIOGRAM;  Surgeon: Hillary Bow, MD;  Location: [facility] LAB;  Service: Cardiovascular;  Laterality: N/A;  . PERCUTANEOUS CORONARY STENT INTERVENTION (PCI-S) N/A 11/19/2011   Procedure:  PERCUTANEOUS CORONARY STENT INTERVENTION (PCI-S);  Surgeon: Hillary Bow, MD;  Location: [facility];  Service: Cardiovascular;  Laterality: N/A;  . POLYPECTOMY    . SHOULDER ARTHROSCOPY W/ ROTATOR CUFF REPAIR     right  . TOTAL THYROIDECTOMY  2007   GOITER   Social History   Social History Narrative   Lives at home alone.  Her son lives near her.   Right-handed.   1 cup coffee per day.   Immunization History  Administered Date(s) Administered  . Fluad Quad(high Dose 65+) 09/24/2019  . Influenza,inj,Quad PF,6+ Mos 09/14/2013  . PFIZER SARS-COV-2 Vaccination 01/28/2020, 02/22/2020  . Pneumococcal Conjugate-13 09/12/2015  . Pneumococcal Polysaccharide-23 06/05/2012  . Tdap 12/27/2003, 03/15/2014     Objective: Vital Signs: BP (!) 103/55 (BP Location: Left Arm, Patient Position: Sitting, Cuff Size: Normal)   Pulse (!) 50   Resp 16   Ht 5' 4.5" (1.638 m)   Wt 203 lb 9.6 oz (92.4 kg)   BMI 34.41 kg/m    Physical Exam Vitals and nursing note reviewed.  Constitutional:      Appearance: She is well-developed.  HENT:     Head: Normocephalic and atraumatic.  Eyes:     Conjunctiva/sclera: Conjunctivae normal.     Pupils: Pupils are equal, round, and reactive to light.  Cardiovascular:     Rate and Rhythm: Normal rate and regular rhythm.     Heart sounds: Normal heart sounds.  Pulmonary:     Effort: Pulmonary effort is normal.     Breath sounds: Normal breath sounds.  Abdominal:     General: Bowel sounds are normal.     Palpations: Abdomen is soft.  Musculoskeletal:     Cervical back: Normal range of motion and neck supple.  Skin:    General: Skin is warm and dry.     Capillary Refill: Capillary refill takes less than 2 seconds.  Neurological:     Mental Status: She is alert and oriented to person, place, and time.  Psychiatric:        Behavior: Behavior normal.      Musculoskeletal Exam: She has limited range of motion of her cervical spine.  She has painful  range of motion of her lumbar spine.  Shoulder joints, elbow joints, wrist joints with good range of motion.  She has bilateral DIP and PIP thickening with no synovitis.  Hip joints and knee joints had discomfort range of motion with no synovitis.  She has some PIP and DIP thickening in her feet.  CDAI Exam: CDAI Score: -- Patient Global: --; Provider Global: -- Swollen: --; Tender: -- Joint Exam 04/19/2020   No joint exam has been documented for this visit   There is currently no information documented on the homunculus. Go to the Rheumatology  activity and complete the homunculus joint exam.  Investigation: No additional findings.  Imaging: No results found.  Recent Labs: Lab Results  Component Value Date   WBC 10.2 03/14/2020   HGB 14.2 03/14/2020   PLT 297 03/14/2020   NA 147 (H) 03/14/2020   K 4.6 03/14/2020   CL 103 03/14/2020   CO2 27 03/14/2020   GLUCOSE 124 (H) 03/14/2020   BUN 24 03/14/2020   CREATININE 1.13 (H) 03/14/2020   BILITOT 0.5 11/09/2019   ALKPHOS 76 11/09/2019   AST 14 11/09/2019   ALT 14 11/09/2019   PROT 7.0 11/09/2019   ALBUMIN 4.1 11/09/2019   CALCIUM 9.9 03/14/2020   GFRAA 56 (L) 03/14/2020    Speciality Comments: No specialty comments available.  Procedures:  No procedures performed Allergies: Zetia [ezetimibe]   Assessment / Plan:     Visit Diagnoses: Positive ANA (antinuclear antibody) - History of inflammatory arthritis, ANA 1: 80 speckled, positive RNP, low titer anticardiolipin and beta-2 GP 1: She has no clinical features of autoimmune disease.  We will repeat labs today.  We will contact her once the lab results available.  She is supposed to notify us if she develops any new symptoms.  Otherwise she will return for follow-up in 1 year.  Primary osteoarthritis of both knees -  Bilateral moderate with moderate chondromalacia patella.  Muscle strengthening was discussed.  Need for regular exercise and weight loss was discussed.  DDD  (degenerative disc disease), lumbar -she is followed by Dr. Nelva Bush.  Patient states she decided not to have cortisone injection now.  Peripheral arterial disease ([facility])  Essential hypertension, benign-blood pressure is controlled.  History of hyperlipidemia  History of coronary artery disease  History of MI (myocardial infarction)  Postoperative hypothyroidism  History of gastroesophageal reflux (GERD)  Anxiety  Orders: Orders Placed This Encounter  Procedures  . ANA  . RNP Antibody  . C3 and C4  . Beta-2 glycoprotein antibodies  . Cardiolipin antibodies, IgG, IgM, IgA   No orders of the defined types were placed in this encounter.    Follow-Up Instructions: Return in about 1 year (around 04/19/2021) for +ANA, Osteoarthritis.   Bo Merino, MD  Note - This record has been created using Editor, commissioning.  Chart creation errors have been sought, but may not always  have been located. Such creation errors do not reflect on  the standard of medical care.

## 2020-04-19 ENCOUNTER — Ambulatory Visit (INDEPENDENT_AMBULATORY_CARE_PROVIDER_SITE_OTHER): Payer: Medicare Other | Admitting: Rheumatology

## 2020-04-19 ENCOUNTER — Encounter: Payer: Self-pay | Admitting: Rheumatology

## 2020-04-19 ENCOUNTER — Other Ambulatory Visit: Payer: Self-pay

## 2020-04-19 VITALS — BP 103/55 | HR 50 | Resp 16 | Ht 64.5 in | Wt 203.6 lb

## 2020-04-19 DIAGNOSIS — E890 Postprocedural hypothyroidism: Secondary | ICD-10-CM

## 2020-04-19 DIAGNOSIS — Z8679 Personal history of other diseases of the circulatory system: Secondary | ICD-10-CM

## 2020-04-19 DIAGNOSIS — Z8639 Personal history of other endocrine, nutritional and metabolic disease: Secondary | ICD-10-CM

## 2020-04-19 DIAGNOSIS — I10 Essential (primary) hypertension: Secondary | ICD-10-CM | POA: Diagnosis not present

## 2020-04-19 DIAGNOSIS — R7689 Other specified abnormal immunological findings in serum: Secondary | ICD-10-CM

## 2020-04-19 DIAGNOSIS — Z8719 Personal history of other diseases of the digestive system: Secondary | ICD-10-CM

## 2020-04-19 DIAGNOSIS — M170 Bilateral primary osteoarthritis of knee: Secondary | ICD-10-CM | POA: Diagnosis not present

## 2020-04-19 DIAGNOSIS — R768 Other specified abnormal immunological findings in serum: Secondary | ICD-10-CM | POA: Diagnosis not present

## 2020-04-19 DIAGNOSIS — I739 Peripheral vascular disease, unspecified: Secondary | ICD-10-CM | POA: Diagnosis not present

## 2020-04-19 DIAGNOSIS — M5136 Other intervertebral disc degeneration, lumbar region: Secondary | ICD-10-CM | POA: Diagnosis not present

## 2020-04-19 DIAGNOSIS — I252 Old myocardial infarction: Secondary | ICD-10-CM

## 2020-04-19 DIAGNOSIS — M51369 Other intervertebral disc degeneration, lumbar region without mention of lumbar back pain or lower extremity pain: Secondary | ICD-10-CM

## 2020-04-19 DIAGNOSIS — F419 Anxiety disorder, unspecified: Secondary | ICD-10-CM

## 2020-04-22 LAB — RNP ANTIBODY: Ribonucleic Protein(ENA) Antibody, IgG: 4 AI — AB

## 2020-04-22 LAB — ANA: Anti Nuclear Antibody (ANA): POSITIVE — AB

## 2020-04-22 LAB — C3 AND C4
C3 Complement: 144 mg/dL (ref 83–193)
C4 Complement: 27 mg/dL (ref 15–57)

## 2020-04-22 LAB — BETA-2 GLYCOPROTEIN ANTIBODIES
Beta-2 Glyco 1 IgA: <9 SAU (ref <=20)
Beta-2 Glyco 1 IgM: 32 SMU — ABNORMAL HIGH (ref <=20)
Beta-2 Glyco I IgG: <9 SGU (ref <=20)

## 2020-04-22 LAB — CARDIOLIPIN ANTIBODIES, IGG, IGM, IGA
Anticardiolipin IgA: <11 [APL'U]
Anticardiolipin IgG: <14 [GPL'U]
Anticardiolipin IgM: 12 [MPL'U]

## 2020-04-22 LAB — ANTI-NUCLEAR AB-TITER (ANA TITER): ANA Titer 1: 1:80 {titer} — ABNORMAL HIGH

## 2020-04-26 ENCOUNTER — Telehealth: Payer: Self-pay | Admitting: Rheumatology

## 2020-04-26 NOTE — Telephone Encounter (Signed)
Patient called office, patient did not understand lab results, patient wanted lab results explained to patient. I read lab results to patient's daughter with patient's permission. Patient's daughter verbalized understanding.

## 2020-04-26 NOTE — Telephone Encounter (Signed)
A woman calling on behalf of patient left a voicemail stating "patient got a call this morning regarding her blood test.  She doesn't have enough information and would like to talk with someone who can explain what needs attention."  Please call back at [phone]

## 2020-05-05 ENCOUNTER — Other Ambulatory Visit: Payer: Self-pay

## 2020-05-05 ENCOUNTER — Telehealth: Payer: Self-pay | Admitting: Internal Medicine

## 2020-05-05 ENCOUNTER — Ambulatory Visit: Payer: Medicare Other | Admitting: Family Medicine

## 2020-05-05 DIAGNOSIS — R079 Chest pain, unspecified: Secondary | ICD-10-CM

## 2020-05-05 MED ORDER — NITROGLYCERIN 0.4 MG SL SUBL: 0.4000 mg | SUBLINGUAL_TABLET | SUBLINGUAL | 1 refills | Status: DC | PRN

## 2020-05-05 NOTE — Telephone Encounter (Signed)
*  STAT* If patient is at the pharmacy, call can be transferred to refill team.   1. Which medications need to be refilled? (please list name of each medication and dose if known) Nitroglycerin- need today please  2. Which pharmacy/location (including street and city if local pharmacy) is medication to be sent to?* [facility] [address] [place], [place]  3. Do they need a 30 day or 90 day supply? 3 mos

## 2020-05-05 NOTE — Telephone Encounter (Signed)
Pt's medication was sent to pt's pharmacy as requested. Confirmation received.  °

## 2020-05-08 MED ORDER — KETOCONAZOLE 2 % EX CREA: TOPICAL_CREAM | CUTANEOUS | 0 refills | Status: DC

## 2020-05-08 NOTE — Progress Notes (Signed)
Patient left without being seen.

## 2020-05-13 ENCOUNTER — Telehealth (INDEPENDENT_AMBULATORY_CARE_PROVIDER_SITE_OTHER): Payer: Medicare Other | Admitting: Family Medicine

## 2020-05-13 ENCOUNTER — Other Ambulatory Visit: Payer: Self-pay

## 2020-05-13 ENCOUNTER — Encounter: Payer: Self-pay | Admitting: Family Medicine

## 2020-05-13 VITALS — Wt 200.0 lb

## 2020-05-13 DIAGNOSIS — E782 Mixed hyperlipidemia: Secondary | ICD-10-CM | POA: Diagnosis not present

## 2020-05-13 DIAGNOSIS — M25562 Pain in left knee: Secondary | ICD-10-CM

## 2020-05-13 DIAGNOSIS — I10 Essential (primary) hypertension: Secondary | ICD-10-CM

## 2020-05-13 DIAGNOSIS — E890 Postprocedural hypothyroidism: Secondary | ICD-10-CM | POA: Diagnosis not present

## 2020-05-15 DIAGNOSIS — M25562 Pain in left knee: Secondary | ICD-10-CM | POA: Insufficient documentation

## 2020-05-15 NOTE — Assessment & Plan Note (Signed)
Monitor and report any concerns, no changes to meds. Encouraged heart healthy diet such as the DASH diet and exercise as tolerated.  ?

## 2020-05-15 NOTE — Assessment & Plan Note (Signed)
On Levothyroxine, continue to monitor 

## 2020-05-15 NOTE — Assessment & Plan Note (Signed)
Has been intermittently swollen and is painful in all positions but worse walking and bending. Somewhat improved with lying down. Referred to orthopaedics for further evaluation.

## 2020-05-15 NOTE — Progress Notes (Signed)
Virtual Visit via phone Note  I connected with [name] on 05/13/20 at  9:20 AM EDT by a phone enabled telemedicine application and verified that I am speaking with the correct person using two identifiers.  Location: Patient: home, patient is in visit Provider: home, physician is in visit   I discussed the limitations of evaluation and management by telemedicine and the availability of in person appointments. The patient expressed understanding and agreed to proceed. [name], CMA was able to get the patient set up on a visit, phone after being unable to set up a video visit    Subjective:    Patient ID: [name], adult    DOB: 11-30-1946, 74 y.o.   MRN: [id number]  Chief Complaint  Patient presents with  . Chest Pain    2 month follow up, chest pain improved  . Medication Refill    calcitrol,     HPI Patient is in today for follow up on chronic medical concerns. No recent febrile illness or hospitalizations. She is doing well except for a left knee pain. She notes intermittent swelling but no redness or heat. She is not using pain meds but is minimizing her ambulation due to the pain. It is worse with walking and bending. Better with lying down. Her daughter is visiting from [place]. Denies CP/palp/SOB/HA/congestion/fevers/GI or GU c/o. Taking meds as prescribed  Past Medical History:  Diagnosis Date  . Acute MI, subendocardial ([place])   . Arthritis   . Back pain 05/31/2013  . Benign paroxysmal positional vertigo 08/05/2016  . CAD (coronary artery disease)    a. NSTEMI 10/2011: [place] 11/19/11: pLAD 30%, oOM 60%, AVCFX 30%, CFX after OM2 30%, pRCA 60 and 70%, then 99%, AM 80-90% with TIMI 3 flow.  PCI: Promus DES x 2 to RCA; b. 06/2012 Cath: patent RCA stents w/ subtl occl of Acute Marginal (jailed)->Med rx; c. 05/2015 MV: EF 59%, mod mid infsept/inf/ap lat/ap infarct with peri-infarct isch-->Med Rx; d. 02/2016 Cath: LM nl, LAD 19m, RI 50, RCA patent stents.  . Colitis     . Facial skin lesion 01/28/2017  . GERD (gastroesophageal reflux disease)    occasional  . Hyperlipidemia   . Hypertension   . Hypertensive heart disease    a. Echocardiogram 11/19/11: Difficult acoustic windows, EF 60-65%, normal LV wall thickness, grade 1 diastolic dysfunction  . Hypoparathyroidism ([place])   . Low zinc level 11/26/2016  . Multinodular thyroid    Goiter s/p thyroidectomy in 2007 with post-op  hypocalcemia and post-op hypothyroidism/hypoparathyroidism with hypocalcemia  . Neck pain on right side 07/13/2013  . Nocturia 05/31/2013  . Osteoarthritis   . Pain in right axilla 03/13/2016  . Palpitations    a. 03/2012 - patient set up for event monitor but did not wear correctly -  she declined wearing a repeat monitor  . Post-surgical hypothyroidism   . Sun-damaged skin 12/05/2014  . Tubular adenoma of colon 06/2011  . Unspecified constipation 05/31/2013  . Vertigo   . Zinc deficiency 11/26/2015    Past Surgical History:  Procedure Laterality Date  . CARDIAC CATHETERIZATION N/A 03/08/2016   Procedure: Left Heart Cath and Coronary Angiography;  Surgeon: Jettie Booze, MD;  Location: [facility];  Service: Cardiovascular;  Laterality: N/A;  . COLONOSCOPY  Aenomatous polyps   07/05/2011  . COLONOSCOPY N/A 09/28/2014   Procedure: COLONOSCOPY;  Surgeon: Ladene Artist, MD;  Location: [facility];  Service: Endoscopy;  Laterality: N/A;  . CORONARY  ANGIOPLASTY WITH STENT PLACEMENT    . LEFT HEART CATH AND CORONARY ANGIOGRAPHY N/A 12/09/2018   Procedure: LEFT HEART CATH AND CORONARY ANGIOGRAPHY;  Surgeon: Jettie Booze, MD;  Location: [facility];  Service: Cardiovascular;  Laterality: N/A;  . LEFT HEART CATHETERIZATION WITH CORONARY ANGIOGRAM N/A 07/17/2012   Procedure: LEFT HEART CATHETERIZATION WITH CORONARY ANGIOGRAM;  Surgeon: Hillary Bow, MD;  Location: [facility] C[facility] LAB;  Service: Cardiovascular;  Laterality: N/A;  . PERCUTANEOUS CORONARY STENT  INTERVENTION (PCI-S) N/A 11/19/2011   Procedure: PERCUTANEOUS CORONARY STENT INTERVENTION (PCI-S);  Surgeon: Hillary Bow, MD;  Location: [facility] CATH LAB;  Service: Cardiovascular;  Laterality: N/A;  . POLYPECTOMY    . SHOULDER ARTHROSCOPY W/ ROTATOR CUFF REPAIR     right  . TOTAL THYROIDECTOMY  2007   [facility]    Family History  Problem Relation Age of Onset  . Colon cancer Brother   . Cancer Brother        COLON  . Hypertension Father   . Heart disease Father   . Heart attack Father   . Blindness Sister   . Congestive Heart Failure Sister   . Hypertension Sister   . Healthy Daughter   . Healthy Son   . Thyroid disease Sister   . Cancer Brother        multiple myelomas  . Healthy Daughter   . Healthy Daughter   . Diabetes Neg Hx   . Prostate cancer Neg Hx   . Breast cancer Neg Hx   . Esophageal cancer Neg Hx   . Rectal cancer Neg Hx   . Stomach cancer Neg Hx     Social History   Socioeconomic History  . Marital status: Married    Spouse name: Not on file  . Number of children: 4  . Years of education: 23  . Highest education level: Not on file  Occupational History  . Occupation: Sales person at [employer]  . Smoking status: Never Smoker  . Smokeless tobacco: Never Used  Substance and Sexual Activity  . Alcohol use: Not Currently  . Drug use: No  . Sexual activity: Not Currently  Other Topics Concern  . Not on file  Social History Narrative   Lives at home alone.  Her son lives near her.   Right-handed.   1 cup coffee per day.   Social Determinants of Health   Financial Resource Strain:   . Difficulty of Paying Living Expenses:   Food Insecurity:   . Worried About Charity fundraiser in the Last Year:   . Arboriculturist in the Last Year:   Transportation Needs:   . Film/video editor (Medical):   Marland Kitchen Lack of Transportation (Non-Medical):   Physical Activity:   . Days of Exercise per Week:   . Minutes of Exercise per Session:   Stress:    . Feeling of Stress :   Social Connections:   . Frequency of Communication with Friends and Family:   . Frequency of Social Gatherings with Friends and Family:   . Attends Religious Services:   . Active Member of Clubs or Organizations:   . Attends Archivist Meetings:   Marland Kitchen Marital Status:   Intimate Partner Violence:   . Fear of Current or Ex-Partner:   . Emotionally Abused:   Marland Kitchen Physically Abused:   . Sexually Abused:     Outpatient Medications Prior to Visit  Medication Sig Dispense Refill  .  ALPRAZolam (XANAX) 0.25 MG tablet Take 1 tablet (0.25 mg total) by mouth 2 (two) times daily as needed for anxiety. 30 tablet 0  . amLODipine (NORVASC) 10 MG tablet Take 1 tablet (10 mg total) by mouth daily. 90 tablet 3  . aspirin EC 81 MG tablet Take 81 mg by mouth daily.      . B Complex-C (B-COMPLEX WITH VITAMIN C) tablet Take 1 tablet by mouth daily.    . bisoprolol (ZEBETA) 5 MG tablet Take 1 tablet (5 mg total) by mouth daily. 90 tablet 3  . calcitRIOL (ROCALTROL) 0.25 MCG capsule TAKE 3 CAPSULES(0.75 MCG) BY MOUTH DAILY 270 capsule 2  . Cholecalciferol (VITAMIN D-3) 25 MCG (1000 UT) CAPS Take 3000-4000 units by mouth daily 60 capsule 0  . clopidogrel (PLAVIX) 75 MG tablet TAKE 1 TABLET(75 MG) BY MOUTH DAILY 90 tablet 3  . Evolocumab (REPATHA SURECLICK) [id number] MG/ML SOAJ Inject 1 pen into the skin every 14 (fourteen) days. 2 pen 11  . fluconazole (DIFLUCAN) 150 MG tablet fluconazole 150 mg tablet  TK ONE T PO ONCE WEEKLY    . furosemide (LASIX) 20 MG tablet Take one tablet by mouth once per week as needed 15 tablet 3  . isosorbide mononitrate (IMDUR) 60 MG 24 hr tablet Take 1 tablet (60 mg total) by mouth daily. 90 tablet 3  . ketoconazole (NIZORAL) 2 % cream Apply 1 fingertip amount to each foot daily. 30 g 0  . levothyroxine (SYNTHROID) 112 MCG tablet Take 1 tablet (112 mcg total) by mouth daily. 90 tablet 1  . Multiple Vitamin (MULTIVITAMIN WITH MINERALS) TABS tablet Take 1  tablet by mouth daily.    . nitroGLYCERIN (NITROSTAT) 0.4 MG SL tablet Place 1 tablet (0.4 mg total) under the tongue every 5 (five) minutes as needed for chest pain. 75 tablet 1  . pantoprazole (PROTONIX) 40 MG tablet Take 1 tablet (40 mg total) by mouth daily. 90 tablet 3  . rosuvastatin (CRESTOR) 40 MG tablet TAKE 1 TABLET(40 MG) BY MOUTH DAILY 90 tablet 3  . traMADol (ULTRAM) 50 MG tablet tramadol 50 mg tablet  TK 1 T PO TID PRN     No facility-administered medications prior to visit.    Allergies  Allergen Reactions  . Zetia [Ezetimibe] Other (See Comments)    Myalgia, paresthesias and weakness    Review of Systems  Constitutional: Positive for malaise/fatigue. Negative for fever.  HENT: Negative for congestion.   Eyes: Negative for blurred vision.  Respiratory: Negative for shortness of breath.   Cardiovascular: Negative for chest pain, palpitations and leg swelling.  Gastrointestinal: Negative for abdominal pain, blood in stool and nausea.  Genitourinary: Negative for dysuria and frequency.  Musculoskeletal: Positive for joint pain. Negative for falls.  Skin: Negative for rash.  Neurological: Negative for dizziness, loss of consciousness and headaches.  Endo/Heme/Allergies: Negative for environmental allergies.  Psychiatric/Behavioral: Negative for depression. The patient is not nervous/anxious.        Objective:    Physical Exam unable to obtain via phone visit  Wt 200 lb (90.7 kg)   BMI 33.28 kg/m  Wt Readings from Last 3 Encounters:  05/13/20 200 lb (90.7 kg)  05/05/20 199 lb (90.3 kg)  04/19/20 203 lb 9.6 oz (92.4 kg)    Diabetic Foot Exam - Simple   No data filed     Lab Results  Component Value Date   WBC 10.2 03/14/2020   HGB 14.2 03/14/2020   HCT 42.5 03/14/2020  PLT 297 03/14/2020   GLUCOSE 124 (H) 03/14/2020   CHOL 102 10/12/2019   TRIG 224.0 (H) 10/12/2019   HDL 38.90 (L) 10/12/2019   LDLDIRECT 33.0 10/12/2019   LDLCALC 62 04/23/2019    ALT 14 11/09/2019   AST 14 11/09/2019   NA 147 (H) 03/14/2020   K 4.6 03/14/2020   CL 103 03/14/2020   CREATININE 1.13 (H) 03/14/2020   BUN 24 03/14/2020   CO2 27 03/14/2020   TSH 5.82 (H) 10/12/2019   INR 0.86 03/07/2016   HGBA1C 5.9 10/12/2019    Lab Results  Component Value Date   TSH 5.82 (H) 10/12/2019   Lab Results  Component Value Date   WBC 10.2 03/14/2020   HGB 14.2 03/14/2020   HCT 42.5 03/14/2020   MCV 86 03/14/2020   PLT 297 03/14/2020   Lab Results  Component Value Date   NA 147 (H) 03/14/2020   K 4.6 03/14/2020   CO2 27 03/14/2020   GLUCOSE 124 (H) 03/14/2020   BUN 24 03/14/2020   CREATININE 1.13 (H) 03/14/2020   BILITOT 0.5 11/09/2019   ALKPHOS 76 11/09/2019   AST 14 11/09/2019   ALT 14 11/09/2019   PROT 7.0 11/09/2019   ALBUMIN 4.1 11/09/2019   CALCIUM 9.9 03/14/2020   ANIONGAP 7 02/25/2019   GFR 47.24 (L) 11/09/2019   Lab Results  Component Value Date   CHOL 102 10/12/2019   Lab Results  Component Value Date   HDL 38.90 (L) 10/12/2019   Lab Results  Component Value Date   LDLCALC 62 04/23/2019   Lab Results  Component Value Date   TRIG 224.0 (H) 10/12/2019   Lab Results  Component Value Date   CHOLHDL 3 10/12/2019   Lab Results  Component Value Date   HGBA1C 5.9 10/12/2019       Assessment & Plan:   Problem List Items Addressed This Visit    Hypothyroidism    On Levothyroxine, continue to monitor      Hyperlipidemia    Encouraged heart healthy diet, increase exercise, avoid trans fats, consider a krill oil cap daily. Tolerating Rosuvasatin      Essential hypertension, benign    Monitor and report any concerns, no changes to meds. Encouraged heart healthy diet such as the DASH diet and exercise as tolerated.       Acute pain of left knee - Primary    Has been intermittently swollen and is painful in all positions but worse walking and bending. Somewhat improved with lying down. Referred to orthopaedics for further  evaluation.       Relevant Orders   Ambulatory referral to Orthopedic Surgery      I am having Annisten T. Altizer maintain her aspirin EC, B-complex with vitamin C, multivitamin with minerals, ALPRAZolam, traMADol, levothyroxine, calcitRIOL, amLODipine, bisoprolol, clopidogrel, isosorbide mononitrate, rosuvastatin, pantoprazole, Repatha SureClick, furosemide, Vitamin D-3, fluconazole, ketoconazole, and nitroGLYCERIN.  No orders of the defined types were placed in this encounter.    I discussed the assessment and treatment plan with the patient. The patient was provided an opportunity to ask questions and all were answered. The patient agreed with the plan and demonstrated an understanding of the instructions.   The patient was advised to call back or seek an in-person evaluation if the symptoms worsen or if the condition fails to improve as anticipated.  I provided 20 minutes of non-face-to-face time during this encounter.   Penni Homans, MD

## 2020-05-15 NOTE — Assessment & Plan Note (Addendum)
Encouraged heart healthy diet, increase exercise, avoid trans fats, consider a krill oil cap daily. Tolerating Rosuvasatin

## 2020-05-16 DIAGNOSIS — S61211A Laceration without foreign body of left index finger without damage to nail, initial encounter: Secondary | ICD-10-CM | POA: Diagnosis not present

## 2020-05-17 ENCOUNTER — Other Ambulatory Visit: Payer: Self-pay

## 2020-05-17 ENCOUNTER — Telehealth: Payer: Self-pay | Admitting: Internal Medicine

## 2020-05-17 ENCOUNTER — Other Ambulatory Visit: Payer: Self-pay | Admitting: Family Medicine

## 2020-05-17 DIAGNOSIS — E892 Postprocedural hypoparathyroidism: Secondary | ICD-10-CM

## 2020-05-17 DIAGNOSIS — R079 Chest pain, unspecified: Secondary | ICD-10-CM

## 2020-05-17 MED ORDER — CALCITRIOL 0.25 MCG PO CAPS: 0.7500 ug | ORAL_CAPSULE | Freq: Every day | ORAL | 2 refills | Status: DC

## 2020-05-17 NOTE — Telephone Encounter (Signed)
*  STAT* If patient is at the pharmacy, call can be transferred to refill team.   1. Which medications need to be refilled? (please list name of each medication and dose if known) calcitRIOL (ROCALTROL) 0.25 MCG capsule  2. Which pharmacy/location (including street and city if local pharmacy) is medication to be sent to? [facility] at [place], [place]  3. Do they need a 30 day or 90 day supply? 90 day supply

## 2020-05-20 ENCOUNTER — Telehealth: Payer: Self-pay | Admitting: *Deleted

## 2020-05-20 NOTE — Telephone Encounter (Signed)
   Primary Cardiologist: Dorris Carnes, MD   Chart reviewed as part of pre-operative protocol coverage.   This patient had remote PCI/DES to RCA in 2012 with no subsequent interventions and patent stents on [facility] 11/2018.   Dr. Harrington Challenger, any objections to holding aspirin and plavix prior to her upcoming eyelid surgery? Please route your response back to [facility] PREOP   Thank you!  Abigail Butts, PA-C 05/20/2020, 2:24 PM

## 2020-05-20 NOTE — Telephone Encounter (Signed)
   [facility] Pre-operative Risk Assessment    HEARTCARE STAFF: - Please ensure there is not already an duplicate clearance open for this procedure. - Under Visit Info/Reason for Call, type in Other and utilize the format Clearance MM/DD/YY or Clearance TBD. Do not use dashes or single digits. - If request is for dental extraction, please clarify the # of teeth to be extracted.  Request for surgical clearance:  1. What type of surgery is being performed? B/L UPPER EYELID BLEPHAROPTOSIS REPAIR WITH RIGHT UPPER EYELID LESION EXCISION AND REPAIR   2. When is this surgery scheduled? 06/09/20   3. What type of clearance is required (medical clearance vs. Pharmacy clearance to hold med vs. Both)? MEDICAL  4. Are there any medications that need to be held prior to surgery and how long? ASA & PLAVIX   5. Practice name and name of physician performing surgery? [facility]; DR. Elayne Snare ZALDVIAR   6. What is the office phone number? [phone]   7.   What is the office fax number? [phone]  8.   Anesthesia type (None, local, MAC, general) ? MAC   [name] 05/20/2020, 10:44 AM  _________________________________________________________________   (provider comments below)

## 2020-05-30 NOTE — Telephone Encounter (Signed)
OK to hold plavix and ASA prior to procedure   REsume after

## 2020-05-31 DIAGNOSIS — Z4802 Encounter for removal of sutures: Secondary | ICD-10-CM | POA: Diagnosis not present

## 2020-05-31 DIAGNOSIS — S61211S Laceration without foreign body of left index finger without damage to nail, sequela: Secondary | ICD-10-CM | POA: Diagnosis not present

## 2020-05-31 NOTE — Telephone Encounter (Signed)
   Primary Cardiologist: Dorris Carnes, MD  Chart reviewed as part of pre-operative protocol coverage. Patient was last seen by Dr. Harrington Challenger on 03/14/2020 at which time she was doing well from a cardiac standpoint. She reported occasional brief episodes of chest tightness but this was not felt to be cardiac in nature. Patient contacted on 05/31/2020 for pre-op risk assessment. She reported doing well since last visit. No chest pain, shortness of breath, palpitations, dizziness, syncope, or acute CHF symptoms. Able to complete >4.0 METS of activity. Given past medical history and time since last visit, based on ACC/AHA guidelines, [name] would be at acceptable risk for the planned procedure without further cardiovascular testing.   Per Dr. Harrington Challenger, [place] to hold Plavix and Aspirin as needed prior to procedure. These should be restarted as soon as possible in the post-op period.   I will route this recommendation to the requesting party via Epic fax function and remove from pre-op pool.  Please call with questions.  Darreld Mclean, PA-C 05/31/2020, 10:29 AM

## 2020-06-01 ENCOUNTER — Encounter: Payer: Self-pay | Admitting: Orthopedic Surgery

## 2020-06-01 ENCOUNTER — Ambulatory Visit (INDEPENDENT_AMBULATORY_CARE_PROVIDER_SITE_OTHER): Payer: Medicare Other | Admitting: Orthopedic Surgery

## 2020-06-01 ENCOUNTER — Ambulatory Visit (INDEPENDENT_AMBULATORY_CARE_PROVIDER_SITE_OTHER): Payer: Medicare Other

## 2020-06-01 VITALS — Ht 64.0 in | Wt 195.0 lb

## 2020-06-01 DIAGNOSIS — M1712 Unilateral primary osteoarthritis, left knee: Secondary | ICD-10-CM

## 2020-06-01 DIAGNOSIS — M25562 Pain in left knee: Secondary | ICD-10-CM

## 2020-06-01 MED ORDER — BUPIVACAINE HCL 0.25 % IJ SOLN
4.0000 mL | INTRAMUSCULAR | Status: AC | PRN
  Administered 2020-06-01: 4 mL via INTRA_ARTICULAR

## 2020-06-01 MED ORDER — METHYLPREDNISOLONE ACETATE 40 MG/ML IJ SUSP
40.0000 mg | INTRAMUSCULAR | Status: AC | PRN
  Administered 2020-06-01: 40 mg via INTRA_ARTICULAR

## 2020-06-01 MED ORDER — LIDOCAINE HCL 1 % IJ SOLN
5.0000 mL | INTRAMUSCULAR | Status: AC | PRN
  Administered 2020-06-01: 5 mL

## 2020-06-01 NOTE — Progress Notes (Signed)
Office Visit Note   Patient: [name]           Date of Birth: 06-Sep-1946           MRN: [id number] Visit Date: 06/01/2020 Requested by: Mosie Lukes, MD [address] [place],  [place] [address] PCP: Mosie Lukes, MD  Subjective: Chief Complaint  Patient presents with   Left Knee - Pain    HPI: [name] is a 74 y.o. adult who presents to the office complaining of left knee pain.  Patient notes a history of chronic left knee pain without injury.  She notes that pain is worst in the anterior aspect of her left knee and comes and goes in intensity.  She occasionally wakes up with pain.  She denies any mechanical symptoms or instability.  She denies any groin pain but does note low back pain with a history of ESI's as well as radicular symptoms that go down her right lower extremity.  She denies any history of left knee surgery and she does not take any medication to help with her left knee pain.  She does use icy hot and Biofreeze..                ROS:  All systems reviewed are negative as they relate to the chief complaint within the history of present illness.  Patient denies fevers or chills.  Assessment & Plan: Visit Diagnoses:  1. Left knee pain, unspecified chronicity     Plan: Patient is a 74 year old female presents complaint of left knee pain.  She has a long history of left knee pain without injury.  Radiographs taken today reveal mild to moderate degenerative changes, worst in the medial compartment.  She has no mechanical symptoms or instability.  Discussed options available to patient.  She does not want to try oral medications.  Settled on a left knee injection.  Left knee cortisone injection was administered and patient tolerated the procedure well.  Follow-up with the office as needed.  If she has no relief over the next 4 to 6 weeks, she will return to the office with possibility of MRI scan.  Follow-up as needed.  Follow-Up  Instructions: No follow-ups on file.   Orders:  Orders Placed This Encounter  Procedures   XR KNEE 3 VIEW LEFT   No orders of the defined types were placed in this encounter.     Procedures: Large Joint Inj: L knee on 06/01/2020 2:24 PM Indications: diagnostic evaluation, joint swelling and pain Details: 18 G 1.5 in needle, superolateral approach  Arthrogram: No  Medications: 5 mL lidocaine 1 %; 40 mg methylPREDNISolone acetate 40 MG/ML; 4 mL bupivacaine 0.25 % Outcome: tolerated well, no immediate complications Procedure, treatment alternatives, risks and benefits explained, specific risks discussed. Consent was given by the patient. Immediately prior to procedure a time out was called to verify the correct patient, procedure, equipment, support staff and site/side marked as required. Patient was prepped and draped in the usual sterile fashion.       Clinical Data: No additional findings.  Objective: Vital Signs: Ht 5\' 4"  (1.626 m)    Wt 195 lb (88.5 kg)    BMI 33.47 kg/m   Physical Exam:  Constitutional: Patient appears well-developed HEENT:  Head: Normocephalic Eyes:EOM are normal Neck: Normal range of motion Cardiovascular: Normal rate Pulmonary/chest: Effort normal Neurologic: Patient is alert Skin: Skin is warm Psychiatric: Patient has normal mood and affect  Ortho Exam:  Left knee Exam Trace effusion Tender to palpation over the medial joint line Extensor mechanism intact No TTP over the lateral jointlines, quad tendon, patellar tendon, pes anserinus, patella, tibial tubercle, LCL/MCL insertions Stable to varus/valgus stresses.  Stable to anterior/posterior drawer Extension to 0 degrees Flexion > 90 degrees  Specialty Comments:  No specialty comments available.  Imaging: No results found.   PMFS History: Patient Active Problem List   Diagnosis Date Noted   Acute pain of left knee 05/15/2020   Breast pain, left 11/09/2019   Arm lesion  11/09/2019   Hypoglycemia 10/07/2019   Heart disease 01/29/2019   Sensorineural hearing loss (SNHL) of both ears 01/27/2019   Pulmonary congestion 01/19/2019   Primary osteoarthritis of both knees 12/19/2018   DDD (degenerative disc disease), lumbar 12/19/2018   ANA positive 10/27/2018   CAP (community acquired pneumonia) 03/06/2018   Lower extremity pain, lateral, right 02/17/2018   Post-menopausal bleeding 02/17/2018   Cephalalgia 02/17/2018   Anxiety 02/17/2018   Peripheral arterial disease ([place]) 02/20/2017   Facial skin lesion 01/28/2017   Benign paroxysmal positional vertigo 08/05/2016   Atypical chest pain 03/27/2016   Antiplatelet or antithrombotic long-term use 03/27/2016   Pain in right axilla 03/13/2016   Chest pain 03/07/2016   Right lumbar radiculopathy 02/21/2016   Abnormality of gait 02/21/2016   Zinc deficiency 11/26/2015   Chronic low back pain 10/26/2015   Right hip pain 10/26/2015   Medicare annual wellness visit, subsequent 03/13/2015   Preventative health care 03/13/2015   Sun-damaged skin 12/05/2014   Angina of effort ([place]) 12/02/2014   Hx of colonic polyps 09/28/2014   Benign neoplasm of descending colon 09/28/2014   Other fatigue 09/10/2014   Personal history of colonic polyps 07/16/2014   Bilateral hand pain 03/20/2014   Postsurgical hypoparathyroidism ([place]) 08/03/2013   Neck pain on right side 07/13/2013   Constipation 05/31/2013   Back pain 05/31/2013   Nocturia 05/31/2013   GERD (gastroesophageal reflux disease) 11/02/2012   Tension headache 11/02/2012   Anemia 06/11/2012   Hypokalemia 06/11/2012   Depression 01/21/2012   Headache(784.0) 12/23/2011   Bradycardia 12/07/2011   Hypocalcemia 12/07/2011   Acute MI, subendocardial ([facility] [place]) 11/20/2011   Hypothyroidism 05/20/2009   Essential hypertension, benign 05/20/2009   Hyperlipidemia 03/08/2009   CAD, NATIVE VESSEL 03/08/2009   Past Medical  History:  Diagnosis Date   Acute MI, subendocardial ([facility])    Arthritis    Back pain 05/31/2013   Benign paroxysmal positional vertigo 08/05/2016   CAD (coronary artery disease)    a. NSTEMI 10/2011: [facility] 11/19/11: pLAD 30%, oOM 60%, AVCFX 30%, CFX after OM2 30%, pRCA 60 and 70%, then 99%, AM 80-90% with TIMI 3 flow.  PCI: Promus DES x 2 to RCA; b. 06/2012 Cath: patent RCA stents w/ subtl occl of Acute Marginal (jailed)->Med rx; c. 05/2015 MV: EF 59%, mod mid infsept/inf/ap lat/ap infarct with peri-infarct isch-->Med Rx; d. 02/2016 Cath: LM nl, LAD 47m, RI 50, RCA patent stents.   Colitis    Facial skin lesion 01/28/2017   GERD (gastroesophageal reflux disease)    occasional   Hyperlipidemia    Hypertension    Hypertensive heart disease    a. Echocardiogram 11/19/11: Difficult acoustic windows, EF 60-65%, normal LV wall thickness, grade 1 diastolic dysfunction   Hypoparathyroidism ([facility])    Low zinc level 11/26/2016   Multinodular thyroid    Goiter s/p thyroidectomy in 2007 with post-op  hypocalcemia and post-op hypothyroidism/hypoparathyroidism with hypocalcemia  Neck pain on right side 07/13/2013   Nocturia 05/31/2013   Osteoarthritis    Pain in right axilla 03/13/2016   Palpitations    a. 03/2012 - patient set up for event monitor but did not wear correctly -  she declined wearing a repeat monitor   Post-surgical hypothyroidism    Sun-damaged skin 12/05/2014   Tubular adenoma of colon 06/2011   Unspecified constipation 05/31/2013   Vertigo    Zinc deficiency 11/26/2015    Family History  Problem Relation Age of Onset   Colon cancer Brother    Cancer Brother        COLON   Hypertension Father    Heart disease Father    Heart attack Father    Blindness Sister    Congestive Heart Failure Sister    Hypertension Sister    Healthy Daughter    Healthy Son    Thyroid disease Sister    Cancer Brother        multiple myelomas   Healthy Daughter    Healthy  Daughter    Diabetes Neg Hx    Prostate cancer Neg Hx    Breast cancer Neg Hx    Esophageal cancer Neg Hx    Rectal cancer Neg Hx    Stomach cancer Neg Hx     Past Surgical History:  Procedure Laterality Date   CARDIAC CATHETERIZATION N/A 03/08/2016   Procedure: Left Heart Cath and Coronary Angiography;  Surgeon: Jettie Booze, MD;  Location: [facility];  Service: Cardiovascular;  Laterality: N/A;   COLONOSCOPY  Aenomatous polyps   07/05/2011   COLONOSCOPY N/A 09/28/2014   Procedure: COLONOSCOPY;  Surgeon: Ladene Artist, MD;  Location: [facility];  Service: Endoscopy;  Laterality: N/A;   CORONARY ANGIOPLASTY WITH STENT PLACEMENT     LEFT HEART CATH AND CORONARY ANGIOGRAPHY N/A 12/09/2018   Procedure: LEFT HEART CATH AND CORONARY ANGIOGRAPHY;  Surgeon: Jettie Booze, MD;  Location: [facility];  Service: Cardiovascular;  Laterality: N/A;   LEFT HEART CATHETERIZATION WITH CORONARY ANGIOGRAM N/A 07/17/2012   Procedure: LEFT HEART CATHETERIZATION WITH CORONARY ANGIOGRAM;  Surgeon: Hillary Bow, MD;  Location: [facility];  Service: Cardiovascular;  Laterality: N/A;   PERCUTANEOUS CORONARY STENT INTERVENTION (PCI-S) N/A 11/19/2011   Procedure: PERCUTANEOUS CORONARY STENT INTERVENTION (PCI-S);  Surgeon: Hillary Bow, MD;  Location: [facility] CATH LAB;  Service: Cardiovascular;  Laterality: N/A;   POLYPECTOMY     SHOULDER ARTHROSCOPY W/ ROTATOR CUFF REPAIR     right   TOTAL THYROIDECTOMY  2007   GOITER   Social History   Occupational History   Occupation: Press photographer person at [employer]   Smoking status: Never Smoker   Smokeless tobacco: Never Used  Substance and Sexual Activity   Alcohol use: Not Currently   Drug use: No   Sexual activity: Not Currently

## 2020-06-06 ENCOUNTER — Other Ambulatory Visit: Payer: Self-pay | Admitting: Ophthalmology

## 2020-06-06 DIAGNOSIS — H53453 Other localized visual field defect, bilateral: Secondary | ICD-10-CM | POA: Diagnosis not present

## 2020-06-06 DIAGNOSIS — H02423 Myogenic ptosis of bilateral eyelids: Secondary | ICD-10-CM | POA: Diagnosis not present

## 2020-06-06 DIAGNOSIS — D485 Neoplasm of uncertain behavior of skin: Secondary | ICD-10-CM | POA: Diagnosis not present

## 2020-06-06 DIAGNOSIS — H02831 Dermatochalasis of right upper eyelid: Secondary | ICD-10-CM | POA: Diagnosis not present

## 2020-06-06 DIAGNOSIS — L821 Other seborrheic keratosis: Secondary | ICD-10-CM | POA: Diagnosis not present

## 2020-06-06 DIAGNOSIS — H02834 Dermatochalasis of left upper eyelid: Secondary | ICD-10-CM | POA: Diagnosis not present

## 2020-06-06 DIAGNOSIS — H04423 Chronic lacrimal canaliculitis of bilateral lacrimal passages: Secondary | ICD-10-CM | POA: Diagnosis not present

## 2020-07-01 ENCOUNTER — Ambulatory Visit (INDEPENDENT_AMBULATORY_CARE_PROVIDER_SITE_OTHER): Payer: Medicare Other | Admitting: *Deleted

## 2020-07-01 ENCOUNTER — Other Ambulatory Visit: Payer: Self-pay

## 2020-07-01 ENCOUNTER — Encounter: Payer: Self-pay | Admitting: *Deleted

## 2020-07-01 DIAGNOSIS — Z0000 Encounter for general adult medical examination without abnormal findings: Secondary | ICD-10-CM | POA: Diagnosis not present

## 2020-07-01 NOTE — Progress Notes (Addendum)
I connected with [name] today by telephone and verified that I am speaking with the correct person using two identifiers. Location patient: home Location provider: work Persons participating in the virtual visit: patient, Marine scientist.    I discussed the limitations, risks, security and privacy concerns of performing an evaluation and management service by telephone and the availability of in person appointments. I also discussed with the patient that there may be a patient responsible charge related to this service. The patient expressed understanding and verbally consented to this telephonic visit.    Interactive audio and video telecommunications were attempted between this provider and patient, however failed, due to patient having technical difficulties OR patient did not have access to video capability.  We continued and completed visit with audio only.  Some vital signs may be absent or patient reported.    Subjective:   [name] is a 74 y.o. female who presents for Medicare Annual (Subsequent) preventive examination.  Review of Systems     Cardiac Risk Factors include: advanced age (>40men, >54 women);hypertension;dyslipidemia     Objective:     Advanced Directives 07/01/2020 02/25/2019 01/08/2019 12/09/2018 11/17/2018 03/06/2018 03/25/2017  Does Patient Have a Medical Advance Directive? No No No No No No No  Would patient like information on creating a medical advance directive? No - Patient declined - No - Patient declined No - Patient declined No - Patient declined No - Patient declined No - Patient declined  Pre-existing out of facility DNR order (yellow form or pink MOST form) - - - - - - -    Current Medications (verified) Outpatient Encounter Medications as of 07/01/2020  Medication Sig  . ALPRAZolam (XANAX) 0.25 MG tablet Take 1 tablet (0.25 mg total) by mouth 2 (two) times daily as needed for anxiety.  Marland Kitchen amLODipine (NORVASC) 10 MG tablet Take 1 tablet (10 mg total) by  mouth daily.  Marland Kitchen aspirin EC 81 MG tablet Take 81 mg by mouth daily.    . B Complex-C (B-COMPLEX WITH VITAMIN C) tablet Take 1 tablet by mouth daily.  . bisoprolol (ZEBETA) 5 MG tablet Take 1 tablet (5 mg total) by mouth daily.  . calcitRIOL (ROCALTROL) 0.25 MCG capsule TAKE 3 CAPSULES BY MOUTH  DAILY  . Cholecalciferol (VITAMIN D-3) 25 MCG (1000 UT) CAPS Take 3000-4000 units by mouth daily  . clopidogrel (PLAVIX) 75 MG tablet TAKE 1 TABLET(75 MG) BY MOUTH DAILY  . Evolocumab (REPATHA SURECLICK) 517 MG/ML SOAJ Inject 1 pen into the skin every 14 (fourteen) days.  . fluconazole (DIFLUCAN) 150 MG tablet fluconazole 150 mg tablet  TK ONE T PO ONCE WEEKLY  . furosemide (LASIX) 20 MG tablet Take one tablet by mouth once per week as needed  . isosorbide mononitrate (IMDUR) 60 MG 24 hr tablet Take 1 tablet (60 mg total) by mouth daily.  Marland Kitchen ketoconazole (NIZORAL) 2 % cream Apply 1 fingertip amount to each foot daily.  Marland Kitchen levothyroxine (SYNTHROID) 112 MCG tablet Take 1 tablet (112 mcg total) by mouth daily.  . Multiple Vitamin (MULTIVITAMIN WITH MINERALS) TABS tablet Take 1 tablet by mouth daily.  . pantoprazole (PROTONIX) 40 MG tablet Take 1 tablet (40 mg total) by mouth daily.  . rosuvastatin (CRESTOR) 40 MG tablet TAKE 1 TABLET(40 MG) BY MOUTH DAILY  . traMADol (ULTRAM) 50 MG tablet tramadol 50 mg tablet  TK 1 T PO TID PRN  . nitroGLYCERIN (NITROSTAT) 0.4 MG SL tablet DISSOLVE 1 TABLET UNDER THE TONGUE EVERY 5 MINUTES AS  NEEDED FOR CHEST PAIN. MAX  OF 3 TABLETS IN 15 MINUTES. CALL 911 IF PAIN PERSISTS. (Patient not taking: Reported on 07/01/2020)   No facility-administered encounter medications on file as of 07/01/2020.    Allergies (verified) Zetia [ezetimibe]   History: Past Medical History:  Diagnosis Date  . Acute MI, subendocardial ([place])   . Arthritis   . Back pain 05/31/2013  . Benign paroxysmal positional vertigo 08/05/2016  . CAD (coronary artery disease)    a. NSTEMI 10/2011: [place]  11/19/11: pLAD 30%, oOM 60%, AVCFX 30%, CFX after OM2 30%, pRCA 60 and 70%, then 99%, AM 80-90% with TIMI 3 flow.  PCI: Promus DES x 2 to RCA; b. 06/2012 Cath: patent RCA stents w/ subtl occl of Acute Marginal (jailed)->Med rx; c. 05/2015 MV: EF 59%, mod mid infsept/inf/ap lat/ap infarct with peri-infarct isch-->Med Rx; d. 02/2016 Cath: LM nl, LAD 70m, RI 50, RCA patent stents.  . Colitis   . Facial skin lesion 01/28/2017  . GERD (gastroesophageal reflux disease)    occasional  . Hyperlipidemia   . Hypertension   . Hypertensive heart disease    a. Echocardiogram 11/19/11: Difficult acoustic windows, EF 60-65%, normal LV wall thickness, grade 1 diastolic dysfunction  . Hypoparathyroidism ([place])   . Low zinc level 11/26/2016  . Multinodular thyroid    Goiter s/p thyroidectomy in 2007 with post-op  hypocalcemia and post-op hypothyroidism/hypoparathyroidism with hypocalcemia  . Neck pain on right side 07/13/2013  . Nocturia 05/31/2013  . Osteoarthritis   . Pain in right axilla 03/13/2016  . Palpitations    a. 03/2012 - patient set up for event monitor but did not wear correctly -  she declined wearing a repeat monitor  . Post-surgical hypothyroidism   . Sun-damaged skin 12/05/2014  . Tubular adenoma of colon 06/2011  . Unspecified constipation 05/31/2013  . Vertigo   . Zinc deficiency 11/26/2015   Past Surgical History:  Procedure Laterality Date  . CARDIAC CATHETERIZATION N/A 03/08/2016   Procedure: Left Heart Cath and Coronary Angiography;  Surgeon: Jettie Booze, MD;  Location: [facility];  Service: Cardiovascular;  Laterality: N/A;  . COLONOSCOPY  Aenomatous polyps   07/05/2011  . COLONOSCOPY N/A 09/28/2014   Procedure: COLONOSCOPY;  Surgeon: Ladene Artist, MD;  Location: [facility];  Service: Endoscopy;  Laterality: N/A;  . CORONARY ANGIOPLASTY WITH STENT PLACEMENT    . LEFT HEART CATH AND CORONARY ANGIOGRAPHY N/A 12/09/2018   Procedure: LEFT HEART CATH AND CORONARY ANGIOGRAPHY;   Surgeon: Jettie Booze, MD;  Location: [facility];  Service: Cardiovascular;  Laterality: N/A;  . LEFT HEART CATHETERIZATION WITH CORONARY ANGIOGRAM N/A 07/17/2012   Procedure: LEFT HEART CATHETERIZATION WITH CORONARY ANGIOGRAM;  Surgeon: Hillary Bow, MD;  Location: [facility] CATH LAB;  Service: Cardiovascular;  Laterality: N/A;  . PERCUTANEOUS CORONARY STENT INTERVENTION (PCI-S) N/A 11/19/2011   Procedure: PERCUTANEOUS CORONARY STENT INTERVENTION (PCI-S);  Surgeon: Hillary Bow, MD;  Location: [facility] CATH LAB;  Service: Cardiovascular;  Laterality: N/A;  . POLYPECTOMY    . SHOULDER ARTHROSCOPY W/ ROTATOR CUFF REPAIR     right  . TOTAL THYROIDECTOMY  2007   GOITER   Family History  Problem Relation Age of Onset  . Colon cancer Brother   . Cancer Brother        COLON  . Hypertension Father   . Heart disease Father   . Heart attack Father   . Blindness Sister   . Congestive Heart Failure Sister   .  Hypertension Sister   . Healthy Daughter   . Healthy Son   . Thyroid disease Sister   . Cancer Brother        multiple myelomas  . Healthy Daughter   . Healthy Daughter   . Diabetes Neg Hx   . Prostate cancer Neg Hx   . Breast cancer Neg Hx   . Esophageal cancer Neg Hx   . Rectal cancer Neg Hx   . Stomach cancer Neg Hx    Social History   Socioeconomic History  . Marital status: Married    Spouse name: Not on file  . Number of children: 4  . Years of education: 22  . Highest education level: Not on file  Occupational History  . Occupation: Sales person at [employer]  . Smoking status: Never Smoker  . Smokeless tobacco: Never Used  Vaping Use  . Vaping Use: Never used  Substance and Sexual Activity  . Alcohol use: Not Currently  . Drug use: No  . Sexual activity: Not Currently  Other Topics Concern  . Not on file  Social History Narrative   Lives at home alone.  Her son lives near her.   Right-handed.   1 cup coffee per day.   Social  Determinants of Health   Financial Resource Strain: Low Risk   . Difficulty of Paying Living Expenses: Not hard at all  Food Insecurity: No Food Insecurity  . Worried About Charity fundraiser in the Last Year: Never true  . Ran Out of Food in the Last Year: Never true  Transportation Needs: No Transportation Needs  . Lack of Transportation (Medical): No  . Lack of Transportation (Non-Medical): No  Physical Activity:   . Days of Exercise per Week:   . Minutes of Exercise per Session:   Stress:   . Feeling of Stress :   Social Connections:   . Frequency of Communication with Friends and Family:   . Frequency of Social Gatherings with Friends and Family:   . Attends Religious Services:   . Active Member of Clubs or Organizations:   . Attends Archivist Meetings:   Marland Kitchen Marital Status:     Tobacco Counseling Counseling given: Not Answered   Clinical Intake: Pain : No/denies pain     Activities of Daily Living In your present state of health, do you have any difficulty performing the following activities: 07/01/2020  Hearing? N  Vision? N  Difficulty concentrating or making decisions? N  Walking or climbing stairs? N  Dressing or bathing? N  Doing errands, shopping? Y  Preparing Food and eating ? N  Using the Toilet? N  In the past six months, have you accidently leaked urine? N  Do you have problems with loss of bowel control? N  Managing your Medications? N  Managing your Finances? N  Housekeeping or managing your Housekeeping? N  Some recent data might be hidden    Patient Care Team: Mosie Lukes, MD as PCP - General (Family Medicine) Fay Records, MD as PCP - Cardiology (Cardiology) Ladene Artist, MD as Consulting Physician (Gastroenterology) Fay Records, MD as Consulting Physician (Cardiology) Suella Broad, MD as Consulting Physician (Physical Medicine and Rehabilitation)  Indicate any recent Medical Services you may have received from other  than [facility] providers in the past year (date may be approximate).     Assessment:   This is a routine wellness examination for [name].  Dietary issues and exercise  activities discussed: Current Exercise Habits: Home exercise routine, Type of exercise: stretching, Time (Minutes): 10, Frequency (Times/Week): 3, Weekly Exercise (Minutes/Week): 30, Exercise limited by: None identified Diet (meal preparation, eat out, water intake, caffeinated beverages, dairy products, fruits and vegetables): well balanced   Goals    . Garden again      Depression Screen PHQ 2/9 Scores 07/01/2020 01/08/2019 07/26/2016 05/07/2016 03/07/2015  PHQ - 2 Score 0 0 0 0 0  PHQ- 9 Score - - 0 - -    Fall Risk Fall Risk  07/01/2020 01/08/2019 03/14/2018 05/07/2016 03/07/2015  Falls in the past year? 0 1 No No No  Number falls in past yr: 0 0 - - -  Injury with Fall? 0 0 - - -  Follow up Education provided;Falls prevention discussed - - - -   Lives alone in 1 story home. Any stairs in or around the home? No  If so, are there any without handrails? No  Home free of loose throw rugs in walkways, pet beds, electrical cords, etc? Yes  Adequate lighting in your home to reduce risk of falls? Yes    Cognitive Function: Ad8 score reviewed for issues:  Issues making decisions:no  Less interest in hobbies / activities:no  Repeats questions, stories (family complaining):no  Trouble using ordinary gadgets (microwave, computer, phone):no  Forgets the month or year: no  Mismanaging finances: no  Remembering appts:no  Daily problems with thinking and/or memory:no Ad8 score is=0         Immunizations Immunization History  Administered Date(s) Administered  . Fluad Quad(high Dose 65+) 09/24/2019  . Influenza,inj,Quad PF,6+ Mos 09/14/2013  . PFIZER SARS-COV-2 Vaccination 01/28/2020, 02/22/2020  . Pneumococcal Conjugate-13 09/12/2015  . Pneumococcal Polysaccharide-23 06/05/2012  . Tdap 12/27/2003, 03/15/2014     TDAP status: Up to date Flu Vaccine status: Up to date Pneumococcal vaccine status: Up to date Covid-19 vaccine status: Completed vaccines   Screening Tests Health Maintenance  Topic Date Due  . Hepatitis C Screening  Never done  . URINE MICROALBUMIN  Never done  . PAP SMEAR-Modifier  04/05/2019  . MAMMOGRAM  10/18/2021  . COLONOSCOPY  03/03/2023  . TETANUS/TDAP  03/15/2024  . DEXA SCAN  Completed  . COVID-19 Vaccine  Completed  . PNA vac Low Risk Adult  Completed  . INFLUENZA VACCINE  Discontinued    Health Maintenance  Health Maintenance Due  Topic Date Due  . Hepatitis C Screening  Never done  . URINE MICROALBUMIN  Never done  . PAP SMEAR-Modifier  04/05/2019    Colorectal cancer screening: Completed 03/10/20. Repeat every 3 years Mammogram status: Completed 10/19/19. Repeat every year Bone Density status: Completed 02/03/19. Results reflect: Bone density results: NORMAL. Repeat every 2 years.  Lung Cancer Screening: (Low Dose CT Chest recommended if Age 94-80 years, 30 pack-year currently smoking OR have quit w/in 15years.) does not qualify.    Additional Screening:   Vision Screening: Recommended annual ophthalmology exams for early detection of glaucoma and other disorders of the eye. Is the patient up to date with their annual eye exam?  Yes  Who is the provider or what is the name of the office in which the patient attends annual eye exams? Dr.Digby   Dental Screening: Recommended annual dental exams for proper oral hygiene  Community Resource Referral / Chronic Care Management: CRR required this visit?  No   CCM required this visit?  No      Plan:    Please schedule your next  medicare wellness visit with me in 1 yr.  Continue to eat heart healthy diet (full of fruits, vegetables, whole grains, lean protein, water--limit salt, fat, and sugar intake) and increase physical activity as tolerated.  Continue doing brain stimulating activities  (puzzles, reading, adult coloring books, staying active) to keep memory sharp.   I have personally reviewed and noted the following in the patient's chart:   . Medical and social history . Use of alcohol, tobacco or illicit drugs  . Current medications and supplements . Functional ability and status . Nutritional status . Physical activity . Advanced directives . List of other physicians . Hospitalizations, surgeries, and ER visits in previous 12 months . Vitals . Screenings to include cognitive, depression, and falls . Referrals and appointments  In addition, I have reviewed and discussed with patient certain preventive protocols, quality metrics, and best practice recommendations. A written personalized care plan for preventive services as well as general preventive health recommendations were provided to patient.   Due to this being a telephonic visit, the after visit summary with patients personalized plan was offered to patient via mail or my-chart. Patient declined at this time.  Shela Nevin, [place]   07/01/2020    Medical screening examination/treatment was performed by qualified clinical staff member and as supervising physician I was immediately available for consultation/collaboration. I have reviewed documentation and agree with assessment and plan.  Penni Homans, MD

## 2020-07-01 NOTE — Patient Instructions (Signed)
Please schedule your next medicare wellness visit with me in 1 yr.  Continue to eat heart healthy diet (full of fruits, vegetables, whole grains, lean protein, water--limit salt, fat, and sugar intake) and increase physical activity as tolerated.  Continue doing brain stimulating activities (puzzles, reading, adult coloring books, staying active) to keep memory sharp.    [name] , Thank you for taking time to come for your Medicare Wellness Visit. I appreciate your ongoing commitment to your health goals. Please review the following plan we discussed and let me know if I can assist you in the future.   These are the goals we discussed: Goals    . Increase physical activity       This is a list of the screening recommended for you and due dates:  Health Maintenance  Topic Date Due  .  Hepatitis C: One time screening is recommended by Center for Disease Control  (CDC) for  adults born from [birth year] through 1964.   Never done  . Urine Protein Check  Never done  . Pap Smear  04/05/2019  . Mammogram  10/18/2021  . Colon Cancer Screening  03/03/2023  . Tetanus Vaccine  03/15/2024  . DEXA scan (bone density measurement)  Completed  . COVID-19 Vaccine  Completed  . Pneumonia vaccines  Completed  . Flu Shot  Discontinued    Preventive Care 65 Years and Older, Female Preventive care refers to lifestyle choices and visits with your health care provider that can promote health and wellness. This includes:  A yearly physical exam. This is also called an annual well check.  Regular dental and eye exams.  Immunizations.  Screening for certain conditions.  Healthy lifestyle choices, such as diet and exercise. What can I expect for my preventive care visit? Physical exam Your health care provider will check:  Height and weight. These may be used to calculate body mass index (BMI), which is a measurement that tells if you are at a healthy weight.  Heart rate and blood pressure.  Your  skin for abnormal spots. Counseling Your health care provider may ask you questions about:  Alcohol, tobacco, and drug use.  Emotional well-being.  Home and relationship well-being.  Sexual activity.  Eating habits.  History of falls.  Memory and ability to understand (cognition).  Work and work Statistician.  Pregnancy and menstrual history. What immunizations do I need?  Influenza (flu) vaccine  This is recommended every year. Tetanus, diphtheria, and pertussis (Tdap) vaccine  You may need a Td booster every 10 years. Varicella (chickenpox) vaccine  You may need this vaccine if you have not already been vaccinated. Zoster (shingles) vaccine  You may need this after age 51. Pneumococcal conjugate (PCV13) vaccine  One dose is recommended after age 74. Pneumococcal polysaccharide (PPSV23) vaccine  One dose is recommended after age 22. Measles, mumps, and rubella (MMR) vaccine  You may need at least one dose of MMR if you were born in 1957 or later. You may also need a second dose. Meningococcal conjugate (MenACWY) vaccine  You may need this if you have certain conditions. Hepatitis A vaccine  You may need this if you have certain conditions or if you travel or work in places where you may be exposed to hepatitis A. Hepatitis B vaccine  You may need this if you have certain conditions or if you travel or work in places where you may be exposed to hepatitis B. Haemophilus influenzae type b (Hib) vaccine  You may  need this if you have certain conditions. You may receive vaccines as individual doses or as more than one vaccine together in one shot (combination vaccines). Talk with your health care provider about the risks and benefits of combination vaccines. What tests do I need? Blood tests  Lipid and cholesterol levels. These may be checked every 5 years, or more frequently depending on your overall health.  Hepatitis C test.  Hepatitis B  test. Screening  Lung cancer screening. You may have this screening every year starting at age 55 if you have a 30-pack-year history of smoking and currently smoke or have quit within the past 15 years.  Colorectal cancer screening. All adults should have this screening starting at age 53 and continuing until age 60. Your health care provider may recommend screening at age 73 if you are at increased risk. You will have tests every 1-10 years, depending on your results and the type of screening test.  Diabetes screening. This is done by checking your blood sugar (glucose) after you have not eaten for a while (fasting). You may have this done every 1-3 years.  Mammogram. This may be done every 1-2 years. Talk with your health care provider about how often you should have regular mammograms.  BRCA-related cancer screening. This may be done if you have a family history of breast, ovarian, tubal, or peritoneal cancers. Other tests  Sexually transmitted disease (STD) testing.  Bone density scan. This is done to screen for osteoporosis. You may have this done starting at age 68. Follow these instructions at home: Eating and drinking  Eat a diet that includes fresh fruits and vegetables, whole grains, lean protein, and low-fat dairy products. Limit your intake of foods with high amounts of sugar, saturated fats, and salt.  Take vitamin and mineral supplements as recommended by your health care provider.  Do not drink alcohol if your health care provider tells you not to drink.  If you drink alcohol: ? Limit how much you have to 0-1 drink a day. ? Be aware of how much alcohol is in your drink. In the [place] one drink equals one 12 oz bottle of beer (355 mL), one 5 oz glass of wine (148 mL), or one 1 oz glass of hard liquor (44 mL). Lifestyle  Take daily care of your teeth and gums.  Stay active. Exercise for at least 30 minutes on 5 or more days each week.  Do not use any products that  contain nicotine or tobacco, such as cigarettes, e-cigarettes, and chewing tobacco. If you need help quitting, ask your health care provider.  If you are sexually active, practice safe sex. Use a condom or other form of protection in order to prevent STIs (sexually transmitted infections).  Talk with your health care provider about taking a low-dose aspirin or statin. What's next?  Go to your health care provider once a year for a well check visit.  Ask your health care provider how often you should have your eyes and teeth checked.  Stay up to date on all vaccines. This information is not intended to replace advice given to you by your health care provider. Make sure you discuss any questions you have with your health care provider. Document Revised: 12/04/2018 Document Reviewed: 12/04/2018 Elsevier Patient Education  2020 [employer].

## 2020-07-07 ENCOUNTER — Ambulatory Visit: Payer: Medicare Other | Admitting: Podiatry

## 2020-07-22 ENCOUNTER — Other Ambulatory Visit: Payer: Self-pay | Admitting: *Deleted

## 2020-07-22 MED ORDER — LEVOTHYROXINE SODIUM 112 MCG PO TABS: 112.0000 ug | ORAL_TABLET | Freq: Every day | ORAL | 1 refills | Status: DC

## 2020-08-04 DIAGNOSIS — H43812 Vitreous degeneration, left eye: Secondary | ICD-10-CM | POA: Diagnosis not present

## 2020-08-04 DIAGNOSIS — H25813 Combined forms of age-related cataract, bilateral: Secondary | ICD-10-CM | POA: Diagnosis not present

## 2020-08-04 DIAGNOSIS — H02834 Dermatochalasis of left upper eyelid: Secondary | ICD-10-CM | POA: Diagnosis not present

## 2020-08-04 DIAGNOSIS — H02831 Dermatochalasis of right upper eyelid: Secondary | ICD-10-CM | POA: Diagnosis not present

## 2020-08-05 ENCOUNTER — Other Ambulatory Visit: Payer: Self-pay | Admitting: Family Medicine

## 2020-08-05 ENCOUNTER — Ambulatory Visit (INDEPENDENT_AMBULATORY_CARE_PROVIDER_SITE_OTHER): Payer: Medicare Other | Admitting: Podiatry

## 2020-08-05 ENCOUNTER — Other Ambulatory Visit: Payer: Self-pay

## 2020-08-05 ENCOUNTER — Other Ambulatory Visit: Payer: Self-pay | Admitting: Internal Medicine

## 2020-08-05 DIAGNOSIS — E1151 Type 2 diabetes mellitus with diabetic peripheral angiopathy without gangrene: Secondary | ICD-10-CM | POA: Diagnosis not present

## 2020-08-05 DIAGNOSIS — E1169 Type 2 diabetes mellitus with other specified complication: Secondary | ICD-10-CM | POA: Diagnosis not present

## 2020-08-05 DIAGNOSIS — B351 Tinea unguium: Secondary | ICD-10-CM

## 2020-08-08 NOTE — Progress Notes (Signed)
  Subjective:  Patient ID: [name], adult    DOB: [birth year],  MRN: [id number]  Chief Complaint  Patient presents with  . Nail Problem    Onychomycosis  . Callouses  . Diabetes    74 y.o. adult presents with the above complaint. History confirmed with patient.  Objective:  Physical Exam: warm, good capillary refill, nail exam onychomycosis of the toenails, no trophic changes or ulcerative lesions, normal DP, non-palp PT pulses, and normal sensory exam. Left Foot: normal exam, no swelling, tenderness, instability; ligaments intact, full range of motion of all ankle/foot joints HPK 1st toe medial Right Foot: normal exam, no swelling, tenderness, instability; ligaments intact, full range of motion of all ankle/foot joints HPK 1st toe medial  No images are attached to the encounter.  Assessment:   1. Onychomycosis of multiple toenails with type 2 diabetes mellitus and peripheral angiopathy ([place])    Plan:  Patient was evaluated and treated and all questions answered.  Onychomycosis, Diabetes and PAD -Patient is diabetic with a qualifying condition for at risk foot care.   Procedure: Nail Debridement Rationale: Patient meets criteria for routine foot care due to PAD Type of Debridement: manual, sharp debridement. Instrumentation: Nail nipper, rotary burr. Number of Nails: 10     No follow-ups on file.

## 2020-08-10 DIAGNOSIS — H2513 Age-related nuclear cataract, bilateral: Secondary | ICD-10-CM | POA: Diagnosis not present

## 2020-08-10 DIAGNOSIS — H2512 Age-related nuclear cataract, left eye: Secondary | ICD-10-CM | POA: Diagnosis not present

## 2020-08-15 ENCOUNTER — Telehealth: Payer: Self-pay | Admitting: Internal Medicine

## 2020-08-15 NOTE — Telephone Encounter (Signed)
Patient wanted to know if her Husband [name] could be seen at the same time as her. The patient says both of them need to be seen by Dr. Harrington Challenger every 3 months. The patient did not want to move her appointment out until October to accommodate two appointments slots.  Please let the patient know what the office decides

## 2020-08-17 NOTE — Telephone Encounter (Signed)
Left detailed message that patient's husband has been scheduled with Dr. Harrington Challenger same day.

## 2020-08-23 DIAGNOSIS — H2512 Age-related nuclear cataract, left eye: Secondary | ICD-10-CM | POA: Diagnosis not present

## 2020-08-23 DIAGNOSIS — H25811 Combined forms of age-related cataract, right eye: Secondary | ICD-10-CM | POA: Diagnosis not present

## 2020-08-23 DIAGNOSIS — H25812 Combined forms of age-related cataract, left eye: Secondary | ICD-10-CM | POA: Diagnosis not present

## 2020-09-01 DIAGNOSIS — H2511 Age-related nuclear cataract, right eye: Secondary | ICD-10-CM | POA: Diagnosis not present

## 2020-09-12 ENCOUNTER — Encounter: Payer: Self-pay | Admitting: Internal Medicine

## 2020-09-12 ENCOUNTER — Other Ambulatory Visit: Payer: Self-pay

## 2020-09-12 ENCOUNTER — Ambulatory Visit (INDEPENDENT_AMBULATORY_CARE_PROVIDER_SITE_OTHER): Payer: Medicare Other | Admitting: Internal Medicine

## 2020-09-12 VITALS — BP 116/64 | HR 62 | Ht 64.0 in | Wt 196.4 lb

## 2020-09-12 DIAGNOSIS — I10 Essential (primary) hypertension: Secondary | ICD-10-CM | POA: Diagnosis not present

## 2020-09-12 DIAGNOSIS — I2510 Atherosclerotic heart disease of native coronary artery without angina pectoris: Secondary | ICD-10-CM

## 2020-09-12 DIAGNOSIS — E039 Hypothyroidism, unspecified: Secondary | ICD-10-CM

## 2020-09-12 NOTE — Patient Instructions (Signed)
Medication Instructions:  No changes *If you need a refill on your cardiac medications before your next appointment, please call your pharmacy*   Lab Work: Today: cbc, bmet, tsh, lipids If you have labs (blood work) drawn today and your tests are completely normal, you will receive your results only by: Marland Kitchen MyChart Message (if you have MyChart) OR . A paper copy in the mail If you have any lab test that is abnormal or we need to change your treatment, we will call you to review the results.   Testing/Procedures: none   Follow-Up: At [facility], you and your health needs are our priority.  As part of our continuing mission to provide you with exceptional heart care, we have created designated Provider Care Teams.  These Care Teams include your primary Cardiologist (physician) and Advanced Practice Providers (APPs -  Physician Assistants and Nurse Practitioners) who all work together to provide you with the care you need, when you need it.  We recommend signing up for the patient portal called "MyChart".  Sign up information is provided on this After Visit Summary.  MyChart is used to connect with patients for Virtual Visits (Telemedicine).  Patients are able to view lab/test results, encounter notes, upcoming appointments, etc.  Non-urgent messages can be sent to your provider as well.   To learn more about what you can do with MyChart, go to [email or web address].    Your next appointment:   6 month(s)  The format for your next appointment:   In Person  Provider:   You may see Dorris Carnes, MD or one of the following Advanced Practice Providers on your designated Care Team:    Richardson Dopp, PA-C  Robbie Lis, [place]    Other Instructions

## 2020-09-12 NOTE — Progress Notes (Signed)
Cardiology Office Note   Date:  09/12/2020   ID:  [name], DOB 09-Feb-1946, MRN [id number]  PCP:  Mosie Lukes, MD  Cardiologist:   Dorris Carnes, MD   F/U of CAD    History of Present Illness:  [name] is a 74 y.o. female with CAD - s/p NSTEMI 10/2011 with DESx2  Other issues include thyroidectomy, GERD, HTN, HLD, vertigo.Last myovue showed inferolateral scar with minimal periinfarct ischemia The pt was seen in ED for CP in December 2019   L heart cath in Dec 2019 showed:  LAD with 25%  OM  with 100% with Left to R  collaterals  RCA with patent stent   Normal LVEF     Echo in early Sept 2020  LVEF and RVEF were normal   Mild diastolic dysfunction    I saw the pt in March 2021 Since then she notes occsasional chest discomfort   Usually in evening while sitting   Not with activity   Takes SL NTG and resolved  Did take 3 days in a row. The pt otherwise says her breathing is OK   No CP with activity   Current Meds  Medication Sig  . ALPRAZolam (XANAX) 0.25 MG tablet Take 1 tablet (0.25 mg total) by mouth 2 (two) times daily as needed for anxiety.  Marland Kitchen amLODipine (NORVASC) 10 MG tablet Take 1 tablet (10 mg total) by mouth daily.  Marland Kitchen aspirin EC 81 MG tablet Take 81 mg by mouth daily.    . B Complex-C (B-COMPLEX WITH VITAMIN C) tablet Take 1 tablet by mouth daily.  . bisoprolol (ZEBETA) 5 MG tablet Take 1 tablet (5 mg total) by mouth daily.  . calcitRIOL (ROCALTROL) 0.25 MCG capsule TAKE 3 CAPSULES BY MOUTH  DAILY  . cephALEXin (KEFLEX) 500 MG capsule Take 500 mg by mouth 2 (two) times daily.  . Cholecalciferol (VITAMIN D-3) 25 MCG (1000 UT) CAPS Take 3000-4000 units by mouth daily  . clopidogrel (PLAVIX) 75 MG tablet TAKE 1 TABLET(75 MG) BY MOUTH DAILY  . Evolocumab (REPATHA SURECLICK) 956 MG/ML SOAJ Inject 1 pen into the skin every 14 (fourteen) days.  . fluconazole (DIFLUCAN) 150 MG tablet fluconazole 150 mg tablet  TK ONE T PO ONCE WEEKLY  . furosemide (LASIX) 20  MG tablet Take one tablet by mouth once per week as needed  . isosorbide mononitrate (IMDUR) 60 MG 24 hr tablet TAKE 1 TABLET(60 MG) BY MOUTH DAILY  . ketoconazole (NIZORAL) 2 % cream Apply 1 fingertip amount to each foot daily.  Marland Kitchen levothyroxine (SYNTHROID) 112 MCG tablet TAKE 1 TABLET(112 MCG) BY MOUTH DAILY  . LOTEMAX SM 0.38 % GEL   . Multiple Vitamin (MULTIVITAMIN WITH MINERALS) TABS tablet Take 1 tablet by mouth daily.  Marland Kitchen neomycin-polymyxin-dexameth (MAXITROL) 0.1 % OINT neomycin 3.5 mg/g-polymyxin B 10,000 unit/g-dexameth 0.1 % eye oint  . nitroGLYCERIN (NITROSTAT) 0.4 MG SL tablet DISSOLVE 1 TABLET UNDER THE TONGUE EVERY 5 MINUTES AS  NEEDED FOR CHEST PAIN. MAX  OF 3 TABLETS IN 15 MINUTES. CALL 911 IF PAIN PERSISTS.  Marland Kitchen pantoprazole (PROTONIX) 40 MG tablet Take 1 tablet (40 mg total) by mouth daily.  Marland Kitchen PROLENSA 0.07 % SOLN   . rosuvastatin (CRESTOR) 40 MG tablet TAKE 1 TABLET(40 MG) BY MOUTH DAILY  . traMADol (ULTRAM) 50 MG tablet tramadol 50 mg tablet  TK 1 T PO TID PRN     Allergies:   Zetia [ezetimibe]   Past Medical History:  Diagnosis Date  . Acute MI, subendocardial ([place])   . Arthritis   . Back pain 05/31/2013  . Benign paroxysmal positional vertigo 08/05/2016  . CAD (coronary artery disease)    a. NSTEMI 10/2011: [place] 11/19/11: pLAD 30%, oOM 60%, AVCFX 30%, CFX after OM2 30%, pRCA 60 and 70%, then 99%, AM 80-90% with TIMI 3 flow.  PCI: Promus DES x 2 to RCA; b. 06/2012 Cath: patent RCA stents w/ subtl occl of Acute Marginal (jailed)->Med rx; c. 05/2015 MV: EF 59%, mod mid infsept/inf/ap lat/ap infarct with peri-infarct isch-->Med Rx; d. 02/2016 Cath: LM nl, LAD 13m, RI 50, RCA patent stents.  . Colitis   . Facial skin lesion 01/28/2017  . GERD (gastroesophageal reflux disease)    occasional  . Hyperlipidemia   . Hypertension   . Hypertensive heart disease    a. Echocardiogram 11/19/11: Difficult acoustic windows, EF 60-65%, normal LV wall thickness, grade 1 diastolic dysfunction    . Hypoparathyroidism ([place])   . Low zinc level 11/26/2016  . Multinodular thyroid    Goiter s/p thyroidectomy in 2007 with post-op  hypocalcemia and post-op hypothyroidism/hypoparathyroidism with hypocalcemia  . Neck pain on right side 07/13/2013  . Nocturia 05/31/2013  . Osteoarthritis   . Pain in right axilla 03/13/2016  . Palpitations    a. 03/2012 - patient set up for event monitor but did not wear correctly -  she declined wearing a repeat monitor  . Post-surgical hypothyroidism   . Sun-damaged skin 12/05/2014  . Tubular adenoma of colon 06/2011  . Unspecified constipation 05/31/2013  . Vertigo   . Zinc deficiency 11/26/2015    Past Surgical History:  Procedure Laterality Date  . CARDIAC CATHETERIZATION N/A 03/08/2016   Procedure: Left Heart Cath and Coronary Angiography;  Surgeon: Jettie Booze, MD;  Location: [place] [facility];  Service: Cardiovascular;  Laterality: N/A;  . COLONOSCOPY  Aenomatous polyps   07/05/2011  . COLONOSCOPY N/A 09/28/2014   Procedure: COLONOSCOPY;  Surgeon: Ladene Artist, MD;  Location: [facility];  Service: Endoscopy;  Laterality: N/A;  . CORONARY ANGIOPLASTY WITH STENT PLACEMENT    . LEFT HEART CATH AND CORONARY ANGIOGRAPHY N/A 12/09/2018   Procedure: LEFT HEART CATH AND CORONARY ANGIOGRAPHY;  Surgeon: Jettie Booze, MD;  Location: [facility];  Service: Cardiovascular;  Laterality: N/A;  . LEFT HEART CATHETERIZATION WITH CORONARY ANGIOGRAM N/A 07/17/2012   Procedure: LEFT HEART CATHETERIZATION WITH CORONARY ANGIOGRAM;  Surgeon: Hillary Bow, MD;  Location: [facility];  Service: Cardiovascular;  Laterality: N/A;  . PERCUTANEOUS CORONARY STENT INTERVENTION (PCI-S) N/A 11/19/2011   Procedure: PERCUTANEOUS CORONARY STENT INTERVENTION (PCI-S);  Surgeon: Hillary Bow, MD;  Location: [facility] CATH LAB;  Service: Cardiovascular;  Laterality: N/A;  . POLYPECTOMY    . SHOULDER ARTHROSCOPY W/ ROTATOR CUFF REPAIR     right  . TOTAL  THYROIDECTOMY  2007   GOITER     Social History:  The patient  reports that she has never smoked. She has never used smokeless tobacco. She reports previous alcohol use. She reports that she does not use drugs.   Family History:  The patient's family history includes Blindness in her sister; Cancer in her brother and brother; Colon cancer in her brother; Congestive Heart Failure in her sister; Healthy in her daughter, daughter, daughter, and son; Heart attack in her father; Heart disease in her father; Hypertension in her father and sister; Thyroid disease in her sister.    ROS:  Please see  the history of present illness. All other systems are reviewed and  Negative to the above problem except as noted.    PHYSICAL EXAM: VS:  BP 116/64   Pulse 62   Ht 5\' 4"  (1.626 m)   Wt 196 lb 6.4 oz (89.1 kg)   SpO2 94%   BMI 33.71 kg/m   GEN: Well nourished, well developed, in no acute distress  HEENT: normal  Neck: JVP is normal   Cardiac: RRR; no murmurs No LE e dema  Respiratory:  clear to auscultation bilaterally,  GI: soft, nontender, nondistended, + BS  No hepatomegaly  MS: no deformity Moving all extremities   Skin: warm and dry, no rash Neuro:  Strength and sensation are intact Psych: euthymic mood, full affect   EKG:  EKG is  Not ordered today  .CATH  2019     Previously placed Prox RCA to Dist RCA stent (unknown type) is widely patent.  Acute Mrg lesion is 100% stenosed. Left to right collaterals.  Ost Ramus lesion is 40% stenosed.  Prox LAD lesion is 25% stenosed.  Ost 1st Diag lesion is 25% stenosed.  The left ventricular systolic function is normal.  LV end diastolic pressure is normal.  The left ventricular ejection fraction is 55-65% by visual estimate.  There is no aortic valve stenosis.   No change in coronary anatomy from 2017.  Continue medical therapy.  Lipid Panel    Component Value Date/Time   CHOL 102 10/12/2019 1020   CHOL 101 10/07/2018 1435    TRIG 224.0 (H) 10/12/2019 1020   HDL 38.90 (L) 10/12/2019 1020   HDL 48 10/07/2018 1435   CHOLHDL 3 10/12/2019 1020   VLDL 44.8 (H) 10/12/2019 1020   LDLCALC 62 04/23/2019 1314   LDLCALC 24 10/07/2018 1435   LDLDIRECT 33.0 10/12/2019 1020      Wt Readings from Last 3 Encounters:  09/12/20 196 lb 6.4 oz (89.1 kg)  06/01/20 195 lb (88.5 kg)  05/13/20 200 lb (90.7 kg)      ASSESSMENT AND PLAN:  1  CAD   Pt with intermittent CP not associated with activity    No change in activity tolerance   I am not convinced spells she is having represent angina (even though relieved with NTG)   Atpical  COntiue to folllow  2   HTN   BP is good  Keep on current regimen    3  HL   Will recheck lipids   4  CV dz  Mild   F/U in 6 months    Current medicines are reviewed at length with the patient today.  The patient does not have concerns regarding medicines.  Signed, Dorris Carnes, MD  09/12/2020 3:58 PM    [address], [place], [place]  [address] Phone: [phone]; Fax: [phone]

## 2020-09-13 DIAGNOSIS — H2511 Age-related nuclear cataract, right eye: Secondary | ICD-10-CM | POA: Diagnosis not present

## 2020-09-13 DIAGNOSIS — H268 Other specified cataract: Secondary | ICD-10-CM | POA: Diagnosis not present

## 2020-09-13 DIAGNOSIS — H25812 Combined forms of age-related cataract, left eye: Secondary | ICD-10-CM | POA: Diagnosis not present

## 2020-09-13 DIAGNOSIS — H25811 Combined forms of age-related cataract, right eye: Secondary | ICD-10-CM | POA: Diagnosis not present

## 2020-09-13 LAB — CBC
Hematocrit: 41.9 % (ref 34.0–46.6)
Hemoglobin: 14.1 g/dL (ref 11.1–15.9)
MCH: 29.3 pg (ref 26.6–33.0)
MCHC: 33.7 g/dL (ref 31.5–35.7)
MCV: 87 fL (ref 79–97)
Platelets: 277 10*3/uL (ref 150–450)
RBC: 4.82 x10E6/uL (ref 3.77–5.28)
RDW: 13.6 % (ref 11.7–15.4)
WBC: 9.5 10*3/uL (ref 3.4–10.8)

## 2020-09-13 LAB — BASIC METABOLIC PANEL
BUN/Creatinine Ratio: 20 (ref 12–28)
BUN: 27 mg/dL (ref 8–27)
CO2: 26 mmol/L (ref 20–29)
Calcium: 9.6 mg/dL (ref 8.7–10.3)
Chloride: 104 mmol/L (ref 96–106)
Creatinine, Ser: 1.34 mg/dL — ABNORMAL HIGH (ref 0.57–1.00)
GFR calc Af Amer: 45 mL/min/{1.73_m2} — ABNORMAL LOW (ref >59)
GFR calc non Af Amer: 39 mL/min/{1.73_m2} — ABNORMAL LOW (ref >59)
Glucose: 100 mg/dL — ABNORMAL HIGH (ref 65–99)
Potassium: 5.2 mmol/L (ref 3.5–5.2)
Sodium: 147 mmol/L — ABNORMAL HIGH (ref 134–144)

## 2020-09-13 LAB — TSH: TSH: 0.647 u[IU]/mL (ref 0.450–4.500)

## 2020-09-13 LAB — LIPID PANEL
Chol/HDL Ratio: 2.8 ratio (ref 0.0–4.4)
Cholesterol, Total: 121 mg/dL (ref 100–199)
HDL: 43 mg/dL (ref >39)
LDL Chol Calc (NIH): 45 mg/dL (ref 0–99)
Triglycerides: 207 mg/dL — ABNORMAL HIGH (ref 0–149)
VLDL Cholesterol Cal: 33 mg/dL (ref 5–40)

## 2020-10-22 ENCOUNTER — Other Ambulatory Visit: Payer: Self-pay | Admitting: Family Medicine

## 2020-11-03 ENCOUNTER — Other Ambulatory Visit: Payer: Self-pay | Admitting: Internal Medicine

## 2020-11-03 DIAGNOSIS — I10 Essential (primary) hypertension: Secondary | ICD-10-CM

## 2020-11-03 DIAGNOSIS — E876 Hypokalemia: Secondary | ICD-10-CM

## 2020-11-03 DIAGNOSIS — E039 Hypothyroidism, unspecified: Secondary | ICD-10-CM

## 2020-11-04 ENCOUNTER — Ambulatory Visit: Payer: Medicare Other | Admitting: Podiatry

## 2020-11-08 ENCOUNTER — Ambulatory Visit: Payer: Medicare Other | Admitting: Podiatry

## 2020-11-15 ENCOUNTER — Ambulatory Visit: Payer: Medicare Other | Admitting: Family

## 2020-11-20 ENCOUNTER — Other Ambulatory Visit: Payer: Self-pay | Admitting: Internal Medicine

## 2020-12-06 ENCOUNTER — Encounter: Payer: Self-pay | Admitting: Family Medicine

## 2020-12-06 ENCOUNTER — Ambulatory Visit (INDEPENDENT_AMBULATORY_CARE_PROVIDER_SITE_OTHER): Payer: Medicare Other | Admitting: Family Medicine

## 2020-12-06 ENCOUNTER — Other Ambulatory Visit: Payer: Self-pay

## 2020-12-06 VITALS — BP 124/68 | HR 61 | Temp 98.2°F | Resp 16 | Wt 202.3 lb

## 2020-12-06 DIAGNOSIS — I10 Essential (primary) hypertension: Secondary | ICD-10-CM | POA: Diagnosis not present

## 2020-12-06 DIAGNOSIS — E60 Dietary zinc deficiency: Secondary | ICD-10-CM

## 2020-12-06 DIAGNOSIS — D500 Iron deficiency anemia secondary to blood loss (chronic): Secondary | ICD-10-CM

## 2020-12-06 DIAGNOSIS — I519 Heart disease, unspecified: Secondary | ICD-10-CM

## 2020-12-06 DIAGNOSIS — E890 Postprocedural hypothyroidism: Secondary | ICD-10-CM

## 2020-12-06 DIAGNOSIS — R351 Nocturia: Secondary | ICD-10-CM

## 2020-12-06 DIAGNOSIS — M549 Dorsalgia, unspecified: Secondary | ICD-10-CM

## 2020-12-06 DIAGNOSIS — R739 Hyperglycemia, unspecified: Secondary | ICD-10-CM | POA: Diagnosis not present

## 2020-12-06 DIAGNOSIS — N950 Postmenopausal bleeding: Secondary | ICD-10-CM

## 2020-12-06 DIAGNOSIS — E782 Mixed hyperlipidemia: Secondary | ICD-10-CM

## 2020-12-06 DIAGNOSIS — H8110 Benign paroxysmal vertigo, unspecified ear: Secondary | ICD-10-CM

## 2020-12-06 MED ORDER — KETOCONAZOLE 2 % EX SHAM: 1.0000 "application " | MEDICATED_SHAMPOO | CUTANEOUS | 0 refills | Status: DC

## 2020-12-06 MED ORDER — MISOPROSTOL 200 MCG PO TABS: ORAL_TABLET | ORAL | 0 refills | Status: DC

## 2020-12-06 NOTE — Assessment & Plan Note (Signed)
Check levels 

## 2020-12-06 NOTE — Assessment & Plan Note (Signed)
asymptomatic

## 2020-12-06 NOTE — Assessment & Plan Note (Signed)
Check labs and monitr

## 2020-12-06 NOTE — Patient Instructions (Signed)

## 2020-12-07 NOTE — Assessment & Plan Note (Signed)
On Levothyroxine, continue to monitor 

## 2020-12-07 NOTE — Assessment & Plan Note (Signed)
Has had some mild episodes recently reminded to hydrate better and can use Meclizine prn, report if worsening.

## 2020-12-07 NOTE — Assessment & Plan Note (Signed)
Well controlled, no changes to meds. Encouraged heart healthy diet such as the DASH diet and exercise as tolerated.  °

## 2020-12-07 NOTE — Assessment & Plan Note (Signed)
Encouraged heart healthy diet, increase exercise, avoid trans fats, consider a krill oil cap daily, tolerating Repatha

## 2020-12-07 NOTE — Progress Notes (Signed)
Subjective:    Patient ID: [name], adult    DOB: March 19, 1946, 74 y.o.   MRN: [id number]  Chief Complaint  Patient presents with  . Follow-up    HPI Patient is in today for follow up on chronic medical concerns. No recent febrile illness or hospitalizations. No polyuria or polydipsia. She has had infrequent episodes of feeling lightheaded when moving quickly. Denies CP/palp/SOB/HA/congestion/fevers/GI or GU c/o. Taking meds as prescribed  Past Medical History:  Diagnosis Date  . Acute MI, subendocardial ([place])   . Arthritis   . Back pain 05/31/2013  . Benign paroxysmal positional vertigo 08/05/2016  . CAD (coronary artery disease)    a. NSTEMI 10/2011: [place] 11/19/11: pLAD 30%, oOM 60%, AVCFX 30%, CFX after OM2 30%, pRCA 60 and 70%, then 99%, AM 80-90% with TIMI 3 flow.  PCI: Promus DES x 2 to RCA; b. 06/2012 Cath: patent RCA stents w/ subtl occl of Acute Marginal (jailed)->Med rx; c. 05/2015 MV: EF 59%, mod mid infsept/inf/ap lat/ap infarct with peri-infarct isch-->Med Rx; d. 02/2016 Cath: LM nl, LAD 16m, RI 50, RCA patent stents.  . Colitis   . Facial skin lesion 01/28/2017  . GERD (gastroesophageal reflux disease)    occasional  . Hyperlipidemia   . Hypertension   . Hypertensive heart disease    a. Echocardiogram 11/19/11: Difficult acoustic windows, EF 60-65%, normal LV wall thickness, grade 1 diastolic dysfunction  . Hypoparathyroidism ([place])   . Low zinc level 11/26/2016  . Multinodular thyroid    Goiter s/p thyroidectomy in 2007 with post-op  hypocalcemia and post-op hypothyroidism/hypoparathyroidism with hypocalcemia  . Neck pain on right side 07/13/2013  . Nocturia 05/31/2013  . Osteoarthritis   . Pain in right axilla 03/13/2016  . Palpitations    a. 03/2012 - patient set up for event monitor but did not wear correctly -  she declined wearing a repeat monitor  . Post-surgical hypothyroidism   . Sun-damaged skin 12/05/2014  . Tubular adenoma of colon 06/2011  . Unspecified  constipation 05/31/2013  . Vertigo   . Zinc deficiency 11/26/2015    Past Surgical History:  Procedure Laterality Date  . CARDIAC CATHETERIZATION N/A 03/08/2016   Procedure: Left Heart Cath and Coronary Angiography;  Surgeon: Jettie Booze, MD;  Location: [place] [facility];  Service: Cardiovascular;  Laterality: N/A;  . COLONOSCOPY  Aenomatous polyps   07/05/2011  . COLONOSCOPY N/A 09/28/2014   Procedure: COLONOSCOPY;  Surgeon: Ladene Artist, MD;  [facility]: [facility];  Service: Endoscopy;  Laterality: N/A;  . CORONARY ANGIOPLASTY WITH STENT PLACEMENT    . LEFT HEART CATH AND CORONARY ANGIOGRAPHY N/A 12/09/2018   Procedure: LEFT HEART CATH AND CORONARY ANGIOGRAPHY;  Surgeon: Jettie Booze, MD;  Location: [place] [facility];  Service: Cardiovascular;  Laterality: N/A;  . LEFT HEART CATHETERIZATION WITH CORONARY ANGIOGRAM N/A 07/17/2012   Procedure: LEFT HEART CATHETERIZATION WITH CORONARY ANGIOGRAM;  Surgeon: Hillary Bow, MD;  Location: [facility] CATH LAB;  Service: Cardiovascular;  Laterality: N/A;  . PERCUTANEOUS CORONARY STENT INTERVENTION (PCI-S) N/A 11/19/2011   Procedure: PERCUTANEOUS CORONARY STENT INTERVENTION (PCI-S);  Surgeon: Hillary Bow, MD;  Location: [facility] CATH LAB;  Service: Cardiovascular;  Laterality: N/A;  . POLYPECTOMY    . SHOULDER ARTHROSCOPY W/ ROTATOR CUFF REPAIR     right  . TOTAL THYROIDECTOMY  2007   GOITER    Family History  Problem Relation Age of Onset  . Colon cancer Brother   . Cancer Brother  COLON  . Hypertension Father   . Heart disease Father   . Heart attack Father   . Blindness Sister   . Congestive Heart Failure Sister   . Hypertension Sister   . Healthy Daughter   . Healthy Son   . Thyroid disease Sister   . Cancer Brother        multiple myelomas  . Healthy Daughter   . Healthy Daughter   . Diabetes Neg Hx   . Prostate cancer Neg Hx   . Breast cancer Neg Hx   . Esophageal cancer Neg Hx   . Rectal cancer Neg Hx    . Stomach cancer Neg Hx     Social History   Socioeconomic History  . Marital status: Married    Spouse name: Not on file  . Number of children: 4  . Years of education: 8  . Highest education level: Not on file  Occupational History  . Occupation: Sales person at [employer]  . Smoking status: Never Smoker  . Smokeless tobacco: Never Used  Vaping Use  . Vaping Use: Never used  Substance and Sexual Activity  . Alcohol use: Not Currently  . Drug use: No  . Sexual activity: Not Currently  Other Topics Concern  . Not on file  Social History Narrative   Lives at home alone.  Her son lives near her.   Right-handed.   1 cup coffee per day.   Social Determinants of Health   Financial Resource Strain: Low Risk   . Difficulty of Paying Living Expenses: Not hard at all  Food Insecurity: No Food Insecurity  . Worried About Charity fundraiser in the Last Year: Never true  . Ran Out of Food in the Last Year: Never true  Transportation Needs: No Transportation Needs  . Lack of Transportation (Medical): No  . Lack of Transportation (Non-Medical): No  Physical Activity: Not on file  Stress: Not on file  Social Connections: Not on file  Intimate Partner Violence: Not on file    Outpatient Medications Prior to Visit  Medication Sig Dispense Refill  . ALPRAZolam (XANAX) 0.25 MG tablet Take 1 tablet (0.25 mg total) by mouth 2 (two) times daily as needed for anxiety. 30 tablet 0  . amLODipine (NORVASC) 10 MG tablet TAKE 1 TABLET(10 MG) BY MOUTH DAILY 90 tablet 3  . aspirin EC 81 MG tablet Take 81 mg by mouth daily.    . B Complex-C (B-COMPLEX WITH VITAMIN C) tablet Take 1 tablet by mouth daily.    . bisoprolol (ZEBETA) 5 MG tablet Take 1 tablet (5 mg total) by mouth daily. 90 tablet 3  . calcitRIOL (ROCALTROL) 0.25 MCG capsule TAKE 3 CAPSULES BY MOUTH  DAILY 270 capsule 2  . cephALEXin (KEFLEX) 500 MG capsule Take 500 mg by mouth 2 (two) times daily.    . Cholecalciferol  (VITAMIN D-3) 25 MCG (1000 UT) CAPS Take 3000-4000 units by mouth daily 60 capsule 0  . clopidogrel (PLAVIX) 75 MG tablet TAKE 1 TABLET(75 MG) BY MOUTH DAILY 90 tablet 3  . fluconazole (DIFLUCAN) 150 MG tablet fluconazole 150 mg tablet  TK ONE T PO ONCE WEEKLY    . furosemide (LASIX) 20 MG tablet Take one tablet by mouth once per week as needed 15 tablet 3  . isosorbide mononitrate (IMDUR) 60 MG 24 hr tablet TAKE 1 TABLET(60 MG) BY MOUTH DAILY 90 tablet 3  . levothyroxine (SYNTHROID) 112 MCG tablet TAKE 1 TABLET BY  MOUTH  DAILY 90 tablet 3  . LOTEMAX SM 0.38 % GEL     . Multiple Vitamin (MULTIVITAMIN WITH MINERALS) TABS tablet Take 1 tablet by mouth daily.    Marland Kitchen neomycin-polymyxin-dexameth (MAXITROL) 0.1 % OINT neomycin 3.5 mg/g-polymyxin B 10,000 unit/g-dexameth 0.1 % eye oint    . nitroGLYCERIN (NITROSTAT) 0.4 MG SL tablet DISSOLVE 1 TABLET UNDER THE TONGUE EVERY 5 MINUTES AS  NEEDED FOR CHEST PAIN. MAX  OF 3 TABLETS IN 15 MINUTES. CALL 911 IF PAIN PERSISTS. 100 tablet 3  . pantoprazole (PROTONIX) 40 MG tablet Take 1 tablet (40 mg total) by mouth daily. 90 tablet 3  . PROLENSA 0.07 % SOLN     . REPATHA SURECLICK 175 MG/ML SOAJ INJECT 140MG  SUBCUTANEOUSLY EVERY 2 WEEKS 6 mL 3  . rosuvastatin (CRESTOR) 40 MG tablet TAKE 1 TABLET(40 MG) BY MOUTH DAILY 90 tablet 3  . traMADol (ULTRAM) 50 MG tablet tramadol 50 mg tablet  TK 1 T PO TID PRN    . ketoconazole (NIZORAL) 2 % cream Apply 1 fingertip amount to each foot daily. 30 g 0   No facility-administered medications prior to visit.    Allergies  Allergen Reactions  . Zetia [Ezetimibe] Other (See Comments)    Myalgia, paresthesias and weakness    Review of Systems  Constitutional: Positive for malaise/fatigue. Negative for fever.  HENT: Negative for congestion.   Eyes: Negative for blurred vision.  Respiratory: Negative for shortness of breath.   Cardiovascular: Negative for chest pain, palpitations and leg swelling.  Gastrointestinal:  Negative for abdominal pain, blood in stool and nausea.  Genitourinary: Negative for dysuria and frequency.  Musculoskeletal: Negative for falls.  Skin: Negative for rash.  Neurological: Positive for dizziness. Negative for loss of consciousness and headaches.  Endo/Heme/Allergies: Negative for environmental allergies.  Psychiatric/Behavioral: Negative for depression. The patient is not nervous/anxious.        Objective:    Physical Exam Vitals and nursing note reviewed.  Constitutional:      General: She is not in acute distress.    Appearance: She is well-developed and well-nourished.  HENT:     Head: Normocephalic and atraumatic.     Nose: Nose normal.  Eyes:     General:        Right eye: No discharge.        Left eye: No discharge.  Cardiovascular:     Rate and Rhythm: Normal rate and regular rhythm.     Heart sounds: No murmur heard.   Pulmonary:     Effort: Pulmonary effort is normal.     Breath sounds: Normal breath sounds.  Abdominal:     General: Bowel sounds are normal.     Palpations: Abdomen is soft.     Tenderness: There is no abdominal tenderness.  Musculoskeletal:        General: No edema.     Cervical back: Normal range of motion and neck supple.  Skin:    General: Skin is warm and dry.  Neurological:     Mental Status: She is alert and oriented to person, place, and time.  Psychiatric:        Mood and Affect: Mood and affect normal.     BP 124/68   Pulse 61   Temp 98.2 F (36.8 C) (Oral)   Resp 16   Wt 202 lb 4.8 oz (91.8 kg)   SpO2 96%   BMI 34.72 kg/m  Wt Readings from Last 3 Encounters:  12/06/20 202  lb 4.8 oz (91.8 kg)  09/12/20 196 lb 6.4 oz (89.1 kg)  06/01/20 195 lb (88.5 kg)    Diabetic Foot Exam - Simple   No data filed    Lab Results  Component Value Date   WBC 9.5 09/12/2020   HGB 14.1 09/12/2020   HCT 41.9 09/12/2020   PLT 277 09/12/2020   GLUCOSE 100 (H) 09/12/2020   CHOL 121 09/12/2020   TRIG 207 (H) 09/12/2020    HDL 43 09/12/2020   LDLDIRECT 33.0 10/12/2019   LDLCALC 45 09/12/2020   ALT 14 11/09/2019   AST 14 11/09/2019   NA 147 (H) 09/12/2020   K 5.2 09/12/2020   CL 104 09/12/2020   CREATININE 1.34 (H) 09/12/2020   BUN 27 09/12/2020   CO2 26 09/12/2020   TSH 0.647 09/12/2020   INR 0.86 03/07/2016   HGBA1C 5.9 10/12/2019    Lab Results  Component Value Date   TSH 0.647 09/12/2020   Lab Results  Component Value Date   WBC 9.5 09/12/2020   HGB 14.1 09/12/2020   HCT 41.9 09/12/2020   MCV 87 09/12/2020   PLT 277 09/12/2020   Lab Results  Component Value Date   NA 147 (H) 09/12/2020   K 5.2 09/12/2020   CO2 26 09/12/2020   GLUCOSE 100 (H) 09/12/2020   BUN 27 09/12/2020   CREATININE 1.34 (H) 09/12/2020   BILITOT 0.5 11/09/2019   ALKPHOS 76 11/09/2019   AST 14 11/09/2019   ALT 14 11/09/2019   PROT 7.0 11/09/2019   ALBUMIN 4.1 11/09/2019   CALCIUM 9.6 09/12/2020   ANIONGAP 7 02/25/2019   GFR 47.24 (L) 11/09/2019   Lab Results  Component Value Date   CHOL 121 09/12/2020   Lab Results  Component Value Date   HDL 43 09/12/2020   Lab Results  Component Value Date   LDLCALC 45 09/12/2020   Lab Results  Component Value Date   TRIG 207 (H) 09/12/2020   Lab Results  Component Value Date   CHOLHDL 2.8 09/12/2020   Lab Results  Component Value Date   HGBA1C 5.9 10/12/2019       Assessment & Plan:   Problem List Items Addressed This Visit    Hypothyroidism - Primary    On Levothyroxine, continue to monitor      Relevant Orders   Vitamin B12   VITAMIN D 25 Hydroxy (Vit-D Deficiency, Fractures)   Hyperlipidemia    Encouraged heart healthy diet, increase exercise, avoid trans fats, consider a krill oil cap daily, tolerating Repatha      Relevant Orders   Lipid panel   VITAMIN D 25 Hydroxy (Vit-D Deficiency, Fractures)   Essential hypertension, benign    Well controlled, no changes to meds. Encouraged heart healthy diet such as the DASH diet and exercise  as tolerated.       Relevant Orders   CBC   Comprehensive metabolic panel   TSH   VITAMIN D 25 Hydroxy (Vit-D Deficiency, Fractures)   Anemia    Check labs and monitr      Relevant Orders   Vitamin B12   Back pain   Relevant Orders   VITAMIN D 25 Hydroxy (Vit-D Deficiency, Fractures)   Nocturia   Relevant Orders   Urinalysis   Zinc deficiency    Check levels       Relevant Orders   Zinc   Benign paroxysmal positional vertigo    Has had some mild episodes recently reminded to hydrate  better and can use Meclizine prn, report if worsening.       Post-menopausal bleeding   Relevant Medications   misoprostol (CYTOTEC) 200 MCG tablet   Heart disease    asymptomatic       Other Visit Diagnoses    Hyperglycemia       Relevant Orders   Hemoglobin A1c      I have discontinued Evalie T. Witthuhn's ketoconazole. I am also having her start on ketoconazole. Additionally, I am having her maintain her aspirin EC, B-complex with vitamin C, multivitamin with minerals, ALPRAZolam, traMADol, bisoprolol, clopidogrel, rosuvastatin, pantoprazole, furosemide, Vitamin D-3, fluconazole, calcitRIOL, nitroGLYCERIN, isosorbide mononitrate, Prolensa, cephALEXin, Lotemax SM, neomycin-polymyxin-dexameth, levothyroxine, amLODipine, Repatha SureClick, and misoprostol.  Meds ordered this encounter  Medications  . misoprostol (CYTOTEC) 200 MCG tablet    Sig: Place two tablets in the vagina the night prior to your next clinic appointment    Dispense:  2 tablet    Refill:  0  . ketoconazole (NIZORAL) 2 % shampoo    Sig: Apply 1 application topically 2 (two) times a week.    Dispense:  120 mL    Refill:  0     Penni Homans, MD

## 2020-12-21 ENCOUNTER — Other Ambulatory Visit: Payer: Self-pay | Admitting: Family Medicine

## 2020-12-21 DIAGNOSIS — K2100 Gastro-esophageal reflux disease with esophagitis, without bleeding: Secondary | ICD-10-CM

## 2020-12-25 ENCOUNTER — Other Ambulatory Visit: Payer: Self-pay | Admitting: Internal Medicine

## 2020-12-27 ENCOUNTER — Other Ambulatory Visit: Payer: Self-pay

## 2020-12-28 ENCOUNTER — Other Ambulatory Visit (HOSPITAL_COMMUNITY)
Admission: RE | Admit: 2020-12-28 | Discharge: 2020-12-28 | Disposition: A | Discharge status: Home / Self Care | Payer: Medicare Other | Source: Ambulatory Visit | Attending: Obstetrics & Gynecology | Admitting: Obstetrics & Gynecology

## 2020-12-28 ENCOUNTER — Encounter: Payer: Self-pay | Admitting: Obstetrics & Gynecology

## 2020-12-28 ENCOUNTER — Other Ambulatory Visit (INDEPENDENT_AMBULATORY_CARE_PROVIDER_SITE_OTHER): Payer: Medicare Other

## 2020-12-28 ENCOUNTER — Ambulatory Visit (INDEPENDENT_AMBULATORY_CARE_PROVIDER_SITE_OTHER): Payer: Medicare Other | Admitting: Obstetrics & Gynecology

## 2020-12-28 VITALS — BP 129/57 | HR 50 | Ht 64.0 in | Wt 200.0 lb

## 2020-12-28 DIAGNOSIS — Z124 Encounter for screening for malignant neoplasm of cervix: Secondary | ICD-10-CM | POA: Diagnosis not present

## 2020-12-28 DIAGNOSIS — I10 Essential (primary) hypertension: Secondary | ICD-10-CM | POA: Diagnosis not present

## 2020-12-28 DIAGNOSIS — Z1151 Encounter for screening for human papillomavirus (HPV): Secondary | ICD-10-CM | POA: Insufficient documentation

## 2020-12-28 DIAGNOSIS — R739 Hyperglycemia, unspecified: Secondary | ICD-10-CM | POA: Diagnosis not present

## 2020-12-28 DIAGNOSIS — Z01419 Encounter for gynecological examination (general) (routine) without abnormal findings: Secondary | ICD-10-CM

## 2020-12-28 DIAGNOSIS — E2839 Other primary ovarian failure: Secondary | ICD-10-CM

## 2020-12-28 DIAGNOSIS — D500 Iron deficiency anemia secondary to blood loss (chronic): Secondary | ICD-10-CM

## 2020-12-28 DIAGNOSIS — E890 Postprocedural hypothyroidism: Secondary | ICD-10-CM

## 2020-12-28 DIAGNOSIS — R351 Nocturia: Secondary | ICD-10-CM

## 2020-12-28 DIAGNOSIS — Z1231 Encounter for screening mammogram for malignant neoplasm of breast: Secondary | ICD-10-CM

## 2020-12-28 DIAGNOSIS — E782 Mixed hyperlipidemia: Secondary | ICD-10-CM | POA: Diagnosis not present

## 2020-12-28 DIAGNOSIS — M549 Dorsalgia, unspecified: Secondary | ICD-10-CM

## 2020-12-28 DIAGNOSIS — E60 Dietary zinc deficiency: Secondary | ICD-10-CM

## 2020-12-28 LAB — COMPREHENSIVE METABOLIC PANEL
ALT: 11 U/L (ref 0–35)
AST: 11 U/L (ref 0–37)
Albumin: 4.1 g/dL (ref 3.5–5.2)
Alkaline Phosphatase: 84 U/L (ref 39–117)
BUN: 21 mg/dL (ref 6–23)
CO2: 32 mEq/L (ref 19–32)
Calcium: 8.6 mg/dL (ref 8.4–10.5)
Chloride: 104 mEq/L (ref 96–112)
Creatinine, Ser: 1.15 mg/dL (ref 0.40–1.20)
GFR: 47.15 mL/min — ABNORMAL LOW (ref >60.00)
Glucose, Bld: 92 mg/dL (ref 70–99)
Potassium: 4.4 mEq/L (ref 3.5–5.1)
Sodium: 142 mEq/L (ref 135–145)
Total Bilirubin: 0.4 mg/dL (ref 0.2–1.2)
Total Protein: 7.1 g/dL (ref 6.0–8.3)

## 2020-12-28 LAB — CBC
HCT: 41.8 % (ref 36.0–46.0)
Hemoglobin: 13.8 g/dL (ref 12.0–15.0)
MCHC: 33 g/dL (ref 30.0–36.0)
MCV: 86.5 fl (ref 78.0–100.0)
Platelets: 250 10*3/uL (ref 150.0–400.0)
RBC: 4.83 Mil/uL (ref 3.87–5.11)
RDW: 14.4 % (ref 11.5–15.5)
WBC: 7.6 10*3/uL (ref 4.0–10.5)

## 2020-12-28 LAB — URINALYSIS
Bilirubin Urine: NEGATIVE
Hgb urine dipstick: NEGATIVE
Ketones, ur: NEGATIVE
Leukocytes,Ua: NEGATIVE
Nitrite: NEGATIVE
Specific Gravity, Urine: 1.015 (ref 1.000–1.030)
Total Protein, Urine: NEGATIVE
Urine Glucose: NEGATIVE
Urobilinogen, UA: 0.2 (ref 0.0–1.0)
pH: 7 (ref 5.0–8.0)

## 2020-12-28 LAB — VITAMIN D 25 HYDROXY (VIT D DEFICIENCY, FRACTURES): VITD: 52.22 ng/mL (ref 30.00–100.00)

## 2020-12-28 LAB — LIPID PANEL
Cholesterol: 161 mg/dL (ref 0–200)
HDL: 47.2 mg/dL (ref >39.00)
NonHDL: 113.5
Total CHOL/HDL Ratio: 3
Triglycerides: 201 mg/dL — ABNORMAL HIGH (ref 0.0–149.0)
VLDL: 40.2 mg/dL — ABNORMAL HIGH (ref 0.0–40.0)

## 2020-12-28 LAB — HEMOGLOBIN A1C: Hgb A1c MFr Bld: 5.8 % (ref 4.6–6.5)

## 2020-12-28 LAB — TSH: TSH: 1.65 u[IU]/mL (ref 0.35–4.50)

## 2020-12-28 LAB — LDL CHOLESTEROL, DIRECT: Direct LDL: 84 mg/dL

## 2020-12-28 LAB — VITAMIN B12: Vitamin B-12: 631 pg/mL (ref 211–911)

## 2020-12-28 NOTE — Addendum Note (Signed)
Addended by: Lorelle Gibbs L on: 12/28/2020 11:27 AM   Modules accepted: Orders

## 2020-12-28 NOTE — Progress Notes (Signed)
75 y.o. [id number] Married Other or two or more races female here for breast and pelvic exam.  Denies vaginal bleeding.  Had episode about three years ago.  Ultrasound showed 47mm endometrium.    Denies vaginal bleeding.  Has no gyn complaints today.  No LMP recorded. Patient is postmenopausal.          Sexually active: No.  H/O STD:  no  Health Maintenance: PCP:  Dr. Randel Pigg.  Last wellness appt was 07/01/2020.  Did blood work at that appt:  No as was virtual visit but had done 08/2020 Vaccines are up to date:  yes Colonoscopy:  2021 with Dr. Fuller Plan, polyps and family hx.  Follow up 3 years. MMG:  11/25/2019.  Aware this is due.  Guidelines reviewed.   BMD:  Order placed Last pap smear:  2019.   H/o abnormal pap smear:  no   reports that she has never smoked. She has never used smokeless tobacco. She reports previous alcohol use. She reports that she does not use drugs.  Past Medical History:  Diagnosis Date  . Acute MI, subendocardial ([place])   . Arthritis   . Back pain 05/31/2013  . Benign paroxysmal positional vertigo 08/05/2016  . CAD (coronary artery disease)    a. NSTEMI 10/2011: [place] 11/19/11: pLAD 30%, oOM 60%, AVCFX 30%, CFX after OM2 30%, pRCA 60 and 70%, then 99%, AM 80-90% with TIMI 3 flow.  PCI: Promus DES x 2 to RCA; b. 06/2012 Cath: patent RCA stents w/ subtl occl of Acute Marginal (jailed)->Med rx; c. 05/2015 MV: EF 59%, mod mid infsept/inf/ap lat/ap infarct with peri-infarct isch-->Med Rx; d. 02/2016 Cath: LM nl, LAD 15m, RI 50, RCA patent stents.  . Colitis   . Facial skin lesion 01/28/2017  . GERD (gastroesophageal reflux disease)    occasional  . Hyperlipidemia   . Hypertension   . Hypertensive heart disease    a. Echocardiogram 11/19/11: Difficult acoustic windows, EF 60-65%, normal LV wall thickness, grade 1 diastolic dysfunction  . Hypoparathyroidism ([place])   . Low zinc level 11/26/2016  . Multinodular thyroid    Goiter s/p thyroidectomy in 2007 with post-op  hypocalcemia and  post-op hypothyroidism/hypoparathyroidism with hypocalcemia  . Neck pain on right side 07/13/2013  . Nocturia 05/31/2013  . Osteoarthritis   . Pain in right axilla 03/13/2016  . Palpitations    a. 03/2012 - patient set up for event monitor but did not wear correctly -  she declined wearing a repeat monitor  . Post-surgical hypothyroidism   . Sun-damaged skin 12/05/2014  . Tubular adenoma of colon 06/2011  . Unspecified constipation 05/31/2013  . Vertigo   . Zinc deficiency 11/26/2015    Past Surgical History:  Procedure Laterality Date  . CARDIAC CATHETERIZATION N/A 03/08/2016   Procedure: Left Heart Cath and Coronary Angiography;  Surgeon: Jettie Booze, MD;  Location: [place] [facility];  Service: Cardiovascular;  Laterality: N/A;  . COLONOSCOPY  Aenomatous polyps   07/05/2011  . COLONOSCOPY N/A 09/28/2014   Procedure: COLONOSCOPY;  Surgeon: Ladene Artist, MD;  [facility]: [facility];  Service: Endoscopy;  Laterality: N/A;  . CORONARY ANGIOPLASTY WITH STENT PLACEMENT    . LEFT HEART CATH AND CORONARY ANGIOGRAPHY N/A 12/09/2018   Procedure: LEFT HEART CATH AND CORONARY ANGIOGRAPHY;  Surgeon: Jettie Booze, MD;  Location: [place] [facility];  Service: Cardiovascular;  Laterality: N/A;  . LEFT HEART CATHETERIZATION WITH CORONARY ANGIOGRAM N/A 07/17/2012   Procedure: LEFT HEART CATHETERIZATION WITH CORONARY  ANGIOGRAM;  Surgeon: Hillary Bow, MD;  Location: [facility] CATH LAB;  Service: Cardiovascular;  Laterality: N/A;  . PERCUTANEOUS CORONARY STENT INTERVENTION (PCI-S) N/A 11/19/2011   Procedure: PERCUTANEOUS CORONARY STENT INTERVENTION (PCI-S);  Surgeon: Hillary Bow, MD;  Location: [facility] CATH LAB;  Service: Cardiovascular;  Laterality: N/A;  . POLYPECTOMY    . SHOULDER ARTHROSCOPY W/ ROTATOR CUFF REPAIR     right  . TOTAL THYROIDECTOMY  2007   GOITER    Current Outpatient Medications  Medication Sig Dispense Refill  . ALPRAZolam (XANAX) 0.25 MG tablet Take 1 tablet (0.25 mg  total) by mouth 2 (two) times daily as needed for anxiety. 30 tablet 0  . amLODipine (NORVASC) 10 MG tablet TAKE 1 TABLET(10 MG) BY MOUTH DAILY 90 tablet 3  . aspirin EC 81 MG tablet Take 81 mg by mouth daily.    . B Complex-C (B-COMPLEX WITH VITAMIN C) tablet Take 1 tablet by mouth daily.    . bisoprolol (ZEBETA) 5 MG tablet Take 1 tablet (5 mg total) by mouth daily. 90 tablet 3  . calcitRIOL (ROCALTROL) 0.25 MCG capsule TAKE 3 CAPSULES BY MOUTH  DAILY 270 capsule 2  . cephALEXin (KEFLEX) 500 MG capsule Take 500 mg by mouth 2 (two) times daily.    . Cholecalciferol (VITAMIN D-3) 25 MCG (1000 UT) CAPS Take 3000-4000 units by mouth daily 60 capsule 0  . clopidogrel (PLAVIX) 75 MG tablet TAKE 1 TABLET(75 MG) BY MOUTH DAILY 90 tablet 3  . furosemide (LASIX) 20 MG tablet TAKE 1 TABLET BY MOUTH ONCE WEEKLY AS NEEDED 15 tablet 2  . isosorbide mononitrate (IMDUR) 60 MG 24 hr tablet TAKE 1 TABLET(60 MG) BY MOUTH DAILY 90 tablet 3  . ketoconazole (NIZORAL) 2 % shampoo Apply 1 application topically 2 (two) times a week. 120 mL 0  . levothyroxine (SYNTHROID) 112 MCG tablet TAKE 1 TABLET BY MOUTH  DAILY 90 tablet 3  . LOTEMAX SM 0.38 % GEL     . misoprostol (CYTOTEC) 200 MCG tablet Place two tablets in the vagina the night prior to your next clinic appointment 2 tablet 0  . Multiple Vitamin (MULTIVITAMIN WITH MINERALS) TABS tablet Take 1 tablet by mouth daily.    Marland Kitchen neomycin-polymyxin-dexameth (MAXITROL) 0.1 % OINT neomycin 3.5 mg/g-polymyxin B 10,000 unit/g-dexameth 0.1 % eye oint    . nitroGLYCERIN (NITROSTAT) 0.4 MG SL tablet DISSOLVE 1 TABLET UNDER THE TONGUE EVERY 5 MINUTES AS  NEEDED FOR CHEST PAIN. MAX  OF 3 TABLETS IN 15 MINUTES. CALL 911 IF PAIN PERSISTS. 100 tablet 3  . pantoprazole (PROTONIX) 40 MG tablet TAKE 1 TABLET BY MOUTH  DAILY 90 tablet 3  . PROLENSA 0.07 % SOLN     . REPATHA SURECLICK [id number] MG/ML SOAJ INJECT 140MG  SUBCUTANEOUSLY EVERY 2 WEEKS 6 mL 3  . rosuvastatin (CRESTOR) 40 MG tablet  TAKE 1 TABLET(40 MG) BY MOUTH DAILY 90 tablet 3  . traMADol (ULTRAM) 50 MG tablet tramadol 50 mg tablet  TK 1 T PO TID PRN    . fluconazole (DIFLUCAN) 150 MG tablet fluconazole 150 mg tablet  TK ONE T PO ONCE WEEKLY (Patient not taking: Reported on 12/28/2020)     No current facility-administered medications for this visit.    Family History  Problem Relation Age of Onset  . Colon cancer Brother   . Cancer Brother        COLON  . Hypertension Father   . Heart disease Father   . Heart attack Father   .  Blindness Sister   . Congestive Heart Failure Sister   . Hypertension Sister   . Healthy Daughter   . Healthy Son   . Thyroid disease Sister   . Cancer Brother        multiple myelomas  . Healthy Daughter   . Healthy Daughter   . Diabetes Neg Hx   . Prostate cancer Neg Hx   . Breast cancer Neg Hx   . Esophageal cancer Neg Hx   . Rectal cancer Neg Hx   . Stomach cancer Neg Hx     Review of Systems  All other systems reviewed and are negative.   Exam:   BP (!) 129/57   Pulse (!) 50   Ht 5\' 4"  (1.626 m)   Wt 200 lb (90.7 kg)   BMI 34.33 kg/m   Height: 5\' 4"  (162.6 cm)  General appearance: alert, cooperative and appears stated age Breasts: normal appearance, no masses or tenderness Abdomen: soft, non-tender; bowel sounds normal; no masses,  no organomegaly Lymph nodes: Cervical, supraclavicular, and axillary nodes normal.  No abnormal inguinal nodes palpated Neurologic: Grossly normal  Pelvic: External genitalia:  no lesions              Urethra:  normal appearing urethra with no masses, tenderness or lesions              Bartholins and Skenes: normal                 Vagina: normal appearing vagina with normal color and discharge, no lesions              Cervix: no lesions              Pap taken: Yes.   Bimanual Exam:  Uterus:  normal size, contour, position, consistency, mobility, non-tender              Adnexa: normal adnexa and no mass, fullness, tenderness                Rectovaginal: Confirms               Anus:  normal sphincter tone, no lesions  Chaperone, , CMA, was present for exam.  Assessment/Plan: 1. Encounter for cervical Pap smear with pelvic exam - MMG order placed.  Pt is due.  Guidelines reviewed. - colonoscopy UTD with Dr. - Pap smear obtained today.  Shared decision making regarding guidelines and pap testing discussed.  Pt and I discussed this being her last pap smear. - lab work up to date.  Additional drawn this morning - vaccines reviewed - Care gaps discussed - BMD is due    2. Hypoestrogenism - no HRT - DG BONE DENSITY (DXA); Future  3. Encounter for screening mammogram for malignant neoplasm of breast - MM DIGITAL SCREENING BILATERAL; Future  4. Cervical cancer screening - Cytology - PAP( [place])  26 minutes of total time was spent for this patient encounter, including preparation, face-to-face counseling with the patient and coordination of care, and documentation of the encounter.

## 2020-12-30 LAB — CYTOLOGY - PAP
Comment: NEGATIVE
Diagnosis: NEGATIVE
High risk HPV: NEGATIVE

## 2020-12-31 LAB — ZINC: Zinc: 75 ug/dL (ref 60–130)

## 2021-01-03 ENCOUNTER — Telehealth: Payer: Self-pay

## 2021-01-03 NOTE — Telephone Encounter (Signed)
Called pt to discuss Pap smear results. Pt made aware that her Pap was WNL and HPV is negative. Pt also made aware that she is PMP so it is common to not have endocervical cells the Pap smear. Understanding was voiced. Aparna Vanderweele l Shine Mikes, CMA .

## 2021-01-03 NOTE — Telephone Encounter (Signed)
-----   Message from Megan Salon, MD sent at 12/31/2020  7:58 AM EST ----- Could this pt be make aware that her pap is normal and HR HPV negative.  She is PMP so it is common not to have endocervical cells present on the pap.  Thank you.

## 2021-01-12 ENCOUNTER — Inpatient Hospital Stay (HOSPITAL_BASED_OUTPATIENT_CLINIC_OR_DEPARTMENT_OTHER): Admission: RE | Admit: 2021-01-12 | Payer: Medicare Other | Source: Ambulatory Visit

## 2021-01-12 ENCOUNTER — Other Ambulatory Visit (HOSPITAL_BASED_OUTPATIENT_CLINIC_OR_DEPARTMENT_OTHER): Payer: Medicare Other

## 2021-01-16 ENCOUNTER — Ambulatory Visit (HOSPITAL_BASED_OUTPATIENT_CLINIC_OR_DEPARTMENT_OTHER): Payer: Medicare Other

## 2021-01-16 ENCOUNTER — Other Ambulatory Visit (HOSPITAL_BASED_OUTPATIENT_CLINIC_OR_DEPARTMENT_OTHER): Payer: Medicare Other

## 2021-01-17 ENCOUNTER — Ambulatory Visit (HOSPITAL_BASED_OUTPATIENT_CLINIC_OR_DEPARTMENT_OTHER): Payer: Medicare Other

## 2021-01-17 ENCOUNTER — Inpatient Hospital Stay (HOSPITAL_BASED_OUTPATIENT_CLINIC_OR_DEPARTMENT_OTHER): Admission: RE | Admit: 2021-01-17 | Payer: Medicare Other | Source: Ambulatory Visit

## 2021-01-25 ENCOUNTER — Other Ambulatory Visit: Payer: Self-pay | Admitting: Internal Medicine

## 2021-01-25 DIAGNOSIS — I10 Essential (primary) hypertension: Secondary | ICD-10-CM

## 2021-01-25 DIAGNOSIS — E039 Hypothyroidism, unspecified: Secondary | ICD-10-CM

## 2021-01-25 DIAGNOSIS — E876 Hypokalemia: Secondary | ICD-10-CM

## 2021-02-06 ENCOUNTER — Ambulatory Visit (HOSPITAL_BASED_OUTPATIENT_CLINIC_OR_DEPARTMENT_OTHER)
Admission: RE | Admit: 2021-02-06 | Discharge: 2021-02-06 | Disposition: A | Discharge status: Home / Self Care | Payer: Medicare Other | Source: Ambulatory Visit | Attending: Obstetrics & Gynecology | Admitting: Obstetrics & Gynecology

## 2021-02-06 ENCOUNTER — Other Ambulatory Visit: Payer: Self-pay

## 2021-02-06 ENCOUNTER — Encounter: Payer: Self-pay | Admitting: *Deleted

## 2021-02-06 ENCOUNTER — Encounter (HOSPITAL_BASED_OUTPATIENT_CLINIC_OR_DEPARTMENT_OTHER): Payer: Self-pay

## 2021-02-06 DIAGNOSIS — E2839 Other primary ovarian failure: Secondary | ICD-10-CM | POA: Diagnosis present

## 2021-02-06 DIAGNOSIS — Z1231 Encounter for screening mammogram for malignant neoplasm of breast: Secondary | ICD-10-CM | POA: Insufficient documentation

## 2021-02-06 DIAGNOSIS — Z780 Asymptomatic menopausal state: Secondary | ICD-10-CM | POA: Diagnosis not present

## 2021-02-07 ENCOUNTER — Telehealth: Payer: Self-pay

## 2021-02-07 NOTE — Telephone Encounter (Signed)
Patient called and made aware that her bone density scan was within the normal limits. Made patient aware that she can repeat it in 3-4 years. Kathrene Alu RN

## 2021-02-07 NOTE — Telephone Encounter (Signed)
-----   Message from Megan Salon, MD sent at 02/06/2021  2:16 PM EST ----- Please let pt know her BMD was normal.  She is not on Mychart.  Follow up can be done in 2 years but I think she can wait longer, like 3-4 years, before repeating since it is so good.  Thanks.

## 2021-02-10 ENCOUNTER — Other Ambulatory Visit: Payer: Self-pay | Admitting: Obstetrics & Gynecology

## 2021-02-10 DIAGNOSIS — L720 Epidermal cyst: Secondary | ICD-10-CM | POA: Diagnosis not present

## 2021-02-10 DIAGNOSIS — B353 Tinea pedis: Secondary | ICD-10-CM | POA: Diagnosis not present

## 2021-02-10 DIAGNOSIS — L738 Other specified follicular disorders: Secondary | ICD-10-CM | POA: Diagnosis not present

## 2021-02-10 DIAGNOSIS — L300 Nummular dermatitis: Secondary | ICD-10-CM | POA: Diagnosis not present

## 2021-02-10 DIAGNOSIS — L819 Disorder of pigmentation, unspecified: Secondary | ICD-10-CM | POA: Diagnosis not present

## 2021-02-10 DIAGNOSIS — L821 Other seborrheic keratosis: Secondary | ICD-10-CM | POA: Diagnosis not present

## 2021-02-10 DIAGNOSIS — R928 Other abnormal and inconclusive findings on diagnostic imaging of breast: Secondary | ICD-10-CM

## 2021-02-13 ENCOUNTER — Other Ambulatory Visit: Payer: Self-pay | Admitting: Family Medicine

## 2021-02-13 DIAGNOSIS — E892 Postprocedural hypoparathyroidism: Secondary | ICD-10-CM

## 2021-02-14 ENCOUNTER — Ambulatory Visit (INDEPENDENT_AMBULATORY_CARE_PROVIDER_SITE_OTHER): Payer: Medicare Other | Admitting: Podiatry

## 2021-02-14 ENCOUNTER — Other Ambulatory Visit: Payer: Self-pay

## 2021-02-14 DIAGNOSIS — E1151 Type 2 diabetes mellitus with diabetic peripheral angiopathy without gangrene: Secondary | ICD-10-CM | POA: Diagnosis not present

## 2021-02-14 DIAGNOSIS — B351 Tinea unguium: Secondary | ICD-10-CM | POA: Diagnosis not present

## 2021-02-14 DIAGNOSIS — E1169 Type 2 diabetes mellitus with other specified complication: Secondary | ICD-10-CM | POA: Diagnosis not present

## 2021-02-14 MED ORDER — FLUCONAZOLE 150 MG PO TABS: 150.0000 mg | ORAL_TABLET | ORAL | 2 refills | Status: DC

## 2021-02-20 NOTE — Progress Notes (Signed)
  Subjective:  Patient ID: [name], adult    DOB: 01-Oct-1946,  MRN: [id number]  No chief complaint on file.   75 y.o. adult presents with the above complaint. Here for at risk foot care. Denies new pedal issues.  Objective:  Physical Exam: warm, good capillary refill, nail exam onychomycosis of the toenails, no trophic changes or ulcerative lesions, normal DP, non-palp PT pulses, and normal sensory exam. Left Foot: normal exam, no swelling, tenderness, instability; ligaments intact, full range of motion of all ankle/foot joints HPK 1st toe medial Right Foot: normal exam, no swelling, tenderness, instability; ligaments intact, full range of motion of all ankle/foot joints HPK 1st toe medial  No images are attached to the encounter.  Assessment:   1. Onychomycosis of multiple toenails with type 2 diabetes mellitus and peripheral angiopathy ([place])    Plan:  Patient was evaluated and treated and all questions answered.  Onychomycosis, Diabetes and PAD -Patient is diabetic with a qualifying condition for at risk foot care.  Procedure: Nail Debridement Type of Debridement: manual, sharp debridement. Instrumentation: Nail nipper, rotary burr. Number of Nails: 10      No follow-ups on file.

## 2021-02-22 ENCOUNTER — Ambulatory Visit (INDEPENDENT_AMBULATORY_CARE_PROVIDER_SITE_OTHER): Payer: Medicare Other | Admitting: Internal Medicine

## 2021-02-22 ENCOUNTER — Other Ambulatory Visit: Payer: Self-pay

## 2021-02-22 ENCOUNTER — Encounter: Payer: Self-pay | Admitting: Internal Medicine

## 2021-02-22 DIAGNOSIS — I10 Essential (primary) hypertension: Secondary | ICD-10-CM | POA: Diagnosis not present

## 2021-02-22 DIAGNOSIS — E876 Hypokalemia: Secondary | ICD-10-CM | POA: Diagnosis not present

## 2021-02-22 DIAGNOSIS — E039 Hypothyroidism, unspecified: Secondary | ICD-10-CM | POA: Diagnosis not present

## 2021-02-22 MED ORDER — FUROSEMIDE 20 MG PO TABS: ORAL_TABLET | ORAL | 3 refills | Status: DC

## 2021-02-22 MED ORDER — ISOSORBIDE MONONITRATE ER 60 MG PO TB24: 60.0000 mg | ORAL_TABLET | Freq: Every day | ORAL | 3 refills | Status: DC

## 2021-02-22 MED ORDER — BISOPROLOL FUMARATE 5 MG PO TABS: 5.0000 mg | ORAL_TABLET | Freq: Every day | ORAL | 3 refills | Status: DC

## 2021-02-22 MED ORDER — ROSUVASTATIN CALCIUM 40 MG PO TABS: ORAL_TABLET | ORAL | 3 refills | Status: DC

## 2021-02-22 MED ORDER — AMLODIPINE BESYLATE 10 MG PO TABS: 10.0000 mg | ORAL_TABLET | Freq: Every day | ORAL | 3 refills | Status: DC

## 2021-02-22 NOTE — Patient Instructions (Signed)
Medication Instructions:  No changes *If you need a refill on your cardiac medications before your next appointment, please call your pharmacy*   Lab Work: none If you have labs (blood work) drawn today and your tests are completely normal, you will receive your results only by: Marland Kitchen MyChart Message (if you have MyChart) OR . A paper copy in the mail If you have any lab test that is abnormal or we need to change your treatment, we will call you to review the results.   Testing/Procedures: none   Follow-Up: At [facility], you and your health needs are our priority.  As part of our continuing mission to provide you with exceptional heart care, we have created designated Provider Care Teams.  These Care Teams include your primary Cardiologist (physician) and Advanced Practice Providers (APPs -  Physician Assistants and Nurse Practitioners) who all work together to provide you with the care you need, when you need it.  We recommend signing up for the patient portal called "MyChart".  Sign up information is provided on this After Visit Summary.  MyChart is used to connect with patients for Virtual Visits (Telemedicine).  Patients are able to view lab/test results, encounter notes, upcoming appointments, etc.  Non-urgent messages can be sent to your provider as well.   To learn more about what you can do with MyChart, go to [email or web address].    Your next appointment:   7 month(s)  The format for your next appointment:   In Person  Provider:   You may see Dorris Carnes, MD or one of the following Advanced Practice Providers on your designated Care Team:    Richardson Dopp, PA-C  Robbie Lis, [place]   Other Instructions

## 2021-02-22 NOTE — Progress Notes (Signed)
Cardiology Office Note   Date:  02/24/2021   ID:  [name], DOB 04-Apr-1946, MRN [id number]  PCP:  Mosie Lukes, MD  Cardiologist:   Dorris Carnes, MD   F/U of CAD    History of Present Illness:  [name] is a 75 y.o. female with CAD - s/p NSTEMI 10/2011 with DESx2  Other issues include thyroidectomy, GERD, HTN, HLD, vertigo.Last myovue showed inferolateral scar with minimal periinfarct ischemia The pt was seen in ED for CP in December 2019   L heart cath in Dec 2019 showed:  LAD with 25%  OM  with 100% with Left to R  collaterals  RCA with patent stent   Normal LVEF        Echo in early Sept 2020   LVEF and RVEF were normal   Mild diastolic dysfunction   I saw the pt in Sept 2021   Since seen the pt denies CP   Breathing is OK   He notes occasional skipping of heart at night   Not at other times   No dizziness   No significant pains in legs   Current Meds  Medication Sig  . ALPRAZolam (XANAX) 0.25 MG tablet Take 1 tablet (0.25 mg total) by mouth 2 (two) times daily as needed for anxiety.  Marland Kitchen aspirin EC 81 MG tablet Take 81 mg by mouth daily.  . B Complex-C (B-COMPLEX WITH VITAMIN C) tablet Take 1 tablet by mouth daily.  . calcitRIOL (ROCALTROL) 0.25 MCG capsule Take 3 capsules (0.75 mcg total) by mouth daily.  . Cholecalciferol (VITAMIN D-3) 25 MCG (1000 UT) CAPS Take 3000-4000 units by mouth daily  . clopidogrel (PLAVIX) 75 MG tablet TAKE 1 TABLET BY MOUTH  DAILY  . fluconazole (DIFLUCAN) 150 MG tablet Take 1 tablet (150 mg total) by mouth once a week.  Marland Kitchen ketoconazole (NIZORAL) 2 % shampoo Apply 1 application topically 2 (two) times a week.  . levothyroxine (SYNTHROID) 112 MCG tablet TAKE 1 TABLET BY MOUTH  DAILY  . LOTEMAX SM 0.38 % GEL   . Multiple Vitamin (MULTIVITAMIN WITH MINERALS) TABS tablet Take 1 tablet by mouth daily.  Marland Kitchen neomycin-polymyxin-dexameth (MAXITROL) 0.1 % OINT neomycin 3.5 mg/g-polymyxin B 10,000 unit/g-dexameth 0.1 % eye oint  .  nitroGLYCERIN (NITROSTAT) 0.4 MG SL tablet DISSOLVE 1 TABLET UNDER THE TONGUE EVERY 5 MINUTES AS  NEEDED FOR CHEST PAIN. MAX  OF 3 TABLETS IN 15 MINUTES. CALL 911 IF PAIN PERSISTS.  Marland Kitchen pantoprazole (PROTONIX) 40 MG tablet TAKE 1 TABLET BY MOUTH  DAILY  . PROLENSA 0.07 % SOLN   . REPATHA SURECLICK 542 MG/ML SOAJ INJECT 140MG  SUBCUTANEOUSLY EVERY 2 WEEKS  . traMADol (ULTRAM) 50 MG tablet tramadol 50 mg tablet  TK 1 T PO TID PRN  . [DISCONTINUED] amLODipine (NORVASC) 10 MG tablet TAKE 1 TABLET BY MOUTH  DAILY  . [DISCONTINUED] bisoprolol (ZEBETA) 5 MG tablet TAKE 1 TABLET BY MOUTH  DAILY  . [DISCONTINUED] furosemide (LASIX) 20 MG tablet TAKE 1 TABLET BY MOUTH ONCE WEEKLY AS NEEDED  . [DISCONTINUED] isosorbide mononitrate (IMDUR) 60 MG 24 hr tablet TAKE 1 TABLET BY MOUTH  DAILY  . [DISCONTINUED] rosuvastatin (CRESTOR) 40 MG tablet TAKE 1 TABLET BY MOUTH  DAILY     Allergies:   Zetia [ezetimibe]   Past Medical History:  Diagnosis Date  . Acute MI, subendocardial ([place])   . Arthritis   . Back pain 05/31/2013  . Benign paroxysmal positional vertigo 08/05/2016  .  CAD (coronary artery disease)    a. NSTEMI 10/2011: [place] 11/19/11: pLAD 30%, oOM 60%, AVCFX 30%, CFX after OM2 30%, pRCA 60 and 70%, then 99%, AM 80-90% with TIMI 3 flow.  PCI: Promus DES x 2 to RCA; b. 06/2012 Cath: patent RCA stents w/ subtl occl of Acute Marginal (jailed)->Med rx; c. 05/2015 MV: EF 59%, mod mid infsept/inf/ap lat/ap infarct with peri-infarct isch-->Med Rx; d. 02/2016 Cath: LM nl, LAD 28m, RI 50, RCA patent stents.  . Colitis   . Facial skin lesion 01/28/2017  . GERD (gastroesophageal reflux disease)    occasional  . Hyperlipidemia   . Hypertension   . Hypertensive heart disease    a. Echocardiogram 11/19/11: Difficult acoustic windows, EF 60-65%, normal LV wall thickness, grade 1 diastolic dysfunction  . Hypoparathyroidism ([place])   . Low zinc level 11/26/2016  . Multinodular thyroid    Goiter s/p thyroidectomy in 2007 with  post-op  hypocalcemia and post-op hypothyroidism/hypoparathyroidism with hypocalcemia  . Neck pain on right side 07/13/2013  . Nocturia 05/31/2013  . Osteoarthritis   . Pain in right axilla 03/13/2016  . Palpitations    a. 03/2012 - patient set up for event monitor but did not wear correctly -  she declined wearing a repeat monitor  . Post-surgical hypothyroidism   . Sun-damaged skin 12/05/2014  . Tubular adenoma of colon 06/2011  . Unspecified constipation 05/31/2013  . Vertigo   . Zinc deficiency 11/26/2015    Past Surgical History:  Procedure Laterality Date  . CARDIAC CATHETERIZATION N/A 03/08/2016   Procedure: Left Heart Cath and Coronary Angiography;  Surgeon: Jettie Booze, MD;  Location: [place] [facility];  Service: Cardiovascular;  Laterality: N/A;  . COLONOSCOPY  Aenomatous polyps   07/05/2011  . COLONOSCOPY N/A 09/28/2014   Procedure: COLONOSCOPY;  Surgeon: Ladene Artist, MD;  Location: [facility];  Service: Endoscopy;  Laterality: N/A;  . CORONARY ANGIOPLASTY WITH STENT PLACEMENT    . LEFT HEART CATH AND CORONARY ANGIOGRAPHY N/A 12/09/2018   Procedure: LEFT HEART CATH AND CORONARY ANGIOGRAPHY;  Surgeon: Jettie Booze, MD;  Location: [place] [facility];  Service: Cardiovascular;  Laterality: N/A;  . LEFT HEART CATHETERIZATION WITH CORONARY ANGIOGRAM N/A 07/17/2012   Procedure: LEFT HEART CATHETERIZATION WITH CORONARY ANGIOGRAM;  Surgeon: Hillary Bow, MD;  Location: [facility] LAB;  Service: Cardiovascular;  Laterality: N/A;  . PERCUTANEOUS CORONARY STENT INTERVENTION (PCI-S) N/A 11/19/2011   Procedure: PERCUTANEOUS CORONARY STENT INTERVENTION (PCI-S);  Surgeon: Hillary Bow, MD;  Location: [facility] C[facility];  Service: Cardiovascular;  Laterality: N/A;  . POLYPECTOMY    . SHOULDER ARTHROSCOPY W/ ROTATOR CUFF REPAIR     right  . TOTAL THYROIDECTOMY  2007   GOITER     Social History:  The patient  reports that she has never smoked. She has never used smokeless  tobacco. She reports previous alcohol use. She reports that she does not use drugs.   Family History:  The patient's family history includes Blindness in her sister; Cancer in her brother and brother; Colon cancer in her brother; Congestive Heart Failure in her sister; Healthy in her daughter, daughter, daughter, and son; Heart attack in her father; Heart disease in her father; Hypertension in her father and sister; Thyroid disease in her sister.    ROS:  Please see the history of present illness. All other systems are reviewed and  Negative to the above problem except as noted.    PHYSICAL EXAM: VS:  BP  114/60   Pulse (!) 57   Ht 5\' 7"  (1.702 m)   Wt 202 lb (91.6 kg)   SpO2 97%   BMI 31.64 kg/m   GEN: Well nourished, well developed, in no acute distress  HEENT: normal  Neck: JVP is normal  No carotid bruits Cardiac: RRR; no murmurs, rubs, or gallops,no edema  Respiratory:  clear to auscultation bilaterally GI: soft, nontender, nondistended, + BS  No hepatomegaly  MS: no deformity Moving all extremities   Skin: warm and dry, no rash Neuro:  Strength and sensation are intact Psych: euthymic mood, full affect   EKG:  EKG is not ordered today.   Lipid Panel    Component Value Date/Time   CHOL 161 12/28/2020 1020   CHOL 121 09/12/2020 1639   TRIG 201.0 (H) 12/28/2020 1020   HDL 47.20 12/28/2020 1020   HDL 43 09/12/2020 1639   CHOLHDL 3 12/28/2020 1020   VLDL 40.2 (H) 12/28/2020 1020   LDLCALC 45 09/12/2020 1639   LDLDIRECT 84.0 12/28/2020 1020      Wt Readings from Last 3 Encounters:  02/22/21 202 lb (91.6 kg)  01/16/21 202 lb (91.6 kg)  12/28/20 200 lb (90.7 kg)      ASSESSMENT AND PLAN:  1  CAD   Pt without symptoms of angina   I would keep on current regimen    2   HTN   BP is well controlled  Keep on current regimen  3  HL   HL   Keep on PCSK9I   Last LDL 45   HDL 47  Continue current meds       F/U in clnic in 6 months    Current medicines are reviewed  at length with the patient today.  The patient does not have concerns regarding medicines.  Signed, Dorris Carnes, MD  02/24/2021 1:19 AM    [facility] [place], [place], [place]  [address] Phone: [phone]; Fax: [phone]

## 2021-02-23 ENCOUNTER — Ambulatory Visit: Payer: Medicare Other

## 2021-02-23 ENCOUNTER — Ambulatory Visit
Admission: RE | Admit: 2021-02-23 | Discharge: 2021-02-23 | Disposition: A | Discharge status: Home / Self Care | Payer: Medicare Other | Source: Ambulatory Visit | Attending: Obstetrics & Gynecology | Admitting: Obstetrics & Gynecology

## 2021-02-23 DIAGNOSIS — R928 Other abnormal and inconclusive findings on diagnostic imaging of breast: Secondary | ICD-10-CM | POA: Diagnosis not present

## 2021-02-23 DIAGNOSIS — R922 Inconclusive mammogram: Secondary | ICD-10-CM | POA: Diagnosis not present

## 2021-02-24 ENCOUNTER — Other Ambulatory Visit: Payer: Medicare Other

## 2021-02-24 ENCOUNTER — Ambulatory Visit: Payer: Medicare Other | Admitting: Internal Medicine

## 2021-02-27 ENCOUNTER — Other Ambulatory Visit: Payer: Self-pay | Admitting: Family Medicine

## 2021-02-27 DIAGNOSIS — R079 Chest pain, unspecified: Secondary | ICD-10-CM

## 2021-03-29 ENCOUNTER — Telehealth: Payer: Self-pay | Admitting: Internal Medicine

## 2021-03-29 DIAGNOSIS — E892 Postprocedural hypoparathyroidism: Secondary | ICD-10-CM

## 2021-03-29 DIAGNOSIS — R079 Chest pain, unspecified: Secondary | ICD-10-CM

## 2021-03-29 MED ORDER — NITROGLYCERIN 0.4 MG SL SUBL: 0.4000 mg | SUBLINGUAL_TABLET | SUBLINGUAL | 3 refills | Status: DC | PRN

## 2021-03-29 MED ORDER — CALCITRIOL 0.25 MCG PO CAPS: 0.7500 ug | ORAL_CAPSULE | Freq: Every day | ORAL | 1 refills | Status: DC

## 2021-03-29 NOTE — Telephone Encounter (Signed)
Follow Up:     Pt says she needs her refill on Calcitriol for 6 months, not 3 months please. I told her we would have to get the doctor approval.

## 2021-03-29 NOTE — Telephone Encounter (Deleted)
Medication refill request for Calcitrol 0.25 mg tablet and Nitro 0.4 SL mg tablets approved and sent to [facility]

## 2021-03-29 NOTE — Telephone Encounter (Signed)
Returned call to pt and have made her aware that we have sent in a new prescription for Calcitrol and NTG back in February 2022. She was thankful for the call back.

## 2021-03-29 NOTE — Telephone Encounter (Signed)
*  STAT* If patient is at the pharmacy, call can be transferred to refill team.   1. Which medications need to be refilled? (please list name of each medication and dose if known)  calcitRIOL (ROCALTROL) 0.25 MCG capsule nitroGLYCERIN (NITROSTAT) 0.4 MG SL tablet  2. Which pharmacy/location (including street and city if local pharmacy) is medication to be sent to? [place], [place], [address]  3. Do they need a 30 day or 90 day supply? [place]

## 2021-04-08 DIAGNOSIS — Z20822 Contact with and (suspected) exposure to covid-19: Secondary | ICD-10-CM | POA: Diagnosis not present

## 2021-05-16 ENCOUNTER — Ambulatory Visit (INDEPENDENT_AMBULATORY_CARE_PROVIDER_SITE_OTHER): Payer: Medicare Other | Admitting: Podiatry

## 2021-05-16 DIAGNOSIS — Z5329 Procedure and treatment not carried out because of patient's decision for other reasons: Secondary | ICD-10-CM

## 2021-05-16 NOTE — Progress Notes (Signed)
No show for appt. 

## 2021-06-01 ENCOUNTER — Ambulatory Visit: Payer: Medicare Other | Admitting: Family Medicine

## 2021-07-02 ENCOUNTER — Other Ambulatory Visit: Payer: Self-pay | Admitting: Internal Medicine

## 2021-07-20 ENCOUNTER — Other Ambulatory Visit: Payer: Self-pay | Admitting: Family Medicine

## 2021-07-27 ENCOUNTER — Other Ambulatory Visit: Payer: Self-pay | Admitting: Internal Medicine

## 2021-07-27 DIAGNOSIS — I214 Non-ST elevation (NSTEMI) myocardial infarction: Secondary | ICD-10-CM

## 2021-07-27 DIAGNOSIS — I2510 Atherosclerotic heart disease of native coronary artery without angina pectoris: Secondary | ICD-10-CM

## 2021-08-25 ENCOUNTER — Telehealth: Payer: Self-pay | Admitting: Internal Medicine

## 2021-08-25 DIAGNOSIS — M79606 Pain in leg, unspecified: Secondary | ICD-10-CM

## 2021-08-25 DIAGNOSIS — R609 Edema, unspecified: Secondary | ICD-10-CM

## 2021-08-25 DIAGNOSIS — M7989 Other specified soft tissue disorders: Secondary | ICD-10-CM

## 2021-08-25 NOTE — Telephone Encounter (Signed)
Spoke with patient. Shortly after getting to [place] in May she developed left leg pain and swelling.  She was seen and checked for a clot.  She thinks they said she has a clot.  I asked if she is on blood thinner she said "no because I told them I am taking aspirin and Plavix."  Got back to [place] 2 days ago.  Still with left leg pain and some swelling.  No SOB.  Wishes to be seen by Dr. Harrington Challenger. I was going to add her onto 08/30/21 DOD day but she wishes to have her husband be see as well.  Will route to Dr. Harrington Challenger for recommendations on further testing/appointment.  Adv if she develops an SOB to go to the ER.   Pt aware I will call her back with recommendations on Tuesday.

## 2021-08-25 NOTE — Telephone Encounter (Signed)
Pt is wanting to discuss some issues w/ Dr. Harrington Challenger.. was unwilling to provide additional info.. please advise

## 2021-08-30 ENCOUNTER — Telehealth: Payer: Self-pay

## 2021-08-30 NOTE — Telephone Encounter (Signed)
Pt called back after being disconnected with Laila. Pt stated she wanted to schedule a OV with Charlett Blake. Pt was advised Charlett Blake didn't have appointments until December. I asked the patient what she would like to seen for and the patient stated she wasn't going to tell me she would speak with Charlett Blake about it. I advised the patient I couldn't schedule a appointment without a reason. Pt became upset and said I was being rude. I advised the patient I had to write something in the appointment note. Pt continued to get loud with me and asked "If I didn't understand customer service?". I repeated I need reason for the visit. Pt responded saying she had a tooth infection. I advised that she should see her dentist. She responded back saying the dentist told her to see her pcp. I advised the patient she should not wait until December to been seen for a tooth infection. Pt stated she wanted to see Dr. Charlett Blake and she wanted to see my face when she came in. I offered the patient to speak with the office manager  and she refused.

## 2021-08-30 NOTE — Telephone Encounter (Signed)
Patient calling back to follow up.

## 2021-08-31 ENCOUNTER — Other Ambulatory Visit: Payer: Self-pay | Admitting: Family Medicine

## 2021-08-31 ENCOUNTER — Other Ambulatory Visit: Payer: Self-pay

## 2021-08-31 ENCOUNTER — Other Ambulatory Visit: Payer: Medicare Other | Admitting: *Deleted

## 2021-08-31 ENCOUNTER — Ambulatory Visit (HOSPITAL_COMMUNITY)
Admission: RE | Admit: 2021-08-31 | Discharge: 2021-08-31 | Disposition: A | Discharge status: Home / Self Care | Payer: Medicare Other | Source: Ambulatory Visit | Attending: Internal Medicine | Admitting: Internal Medicine

## 2021-08-31 DIAGNOSIS — M7989 Other specified soft tissue disorders: Secondary | ICD-10-CM | POA: Insufficient documentation

## 2021-08-31 DIAGNOSIS — M79605 Pain in left leg: Secondary | ICD-10-CM | POA: Diagnosis not present

## 2021-08-31 DIAGNOSIS — M79606 Pain in leg, unspecified: Secondary | ICD-10-CM | POA: Insufficient documentation

## 2021-08-31 DIAGNOSIS — R0600 Dyspnea, unspecified: Secondary | ICD-10-CM | POA: Diagnosis not present

## 2021-08-31 DIAGNOSIS — R0609 Other forms of dyspnea: Secondary | ICD-10-CM | POA: Diagnosis not present

## 2021-08-31 DIAGNOSIS — R609 Edema, unspecified: Secondary | ICD-10-CM | POA: Diagnosis not present

## 2021-08-31 DIAGNOSIS — R5383 Other fatigue: Secondary | ICD-10-CM | POA: Diagnosis not present

## 2021-08-31 NOTE — Telephone Encounter (Signed)
Pt didn't need the antibiotic sent

## 2021-08-31 NOTE — Telephone Encounter (Signed)
Message received from Dr. Harrington Challenger after she spoke with patient on 08/30/21: Patinet had selling in top of Legs   ANkles OK   Went to an emergency room   Lab work done   Thought had clot   USN done  nothing said    Stayed in bed for a month      Legs went down   Now breathing is OK   REcom  1.  CBC, BMET, BNP  2  USN bilaterally legs to see if any signs of clot that has resolved    Orders placed. Spoke with [facility] PV scheduler.  Arranged at [facility] location for 3:00 pm today.  Pt here at office waiting to get labs.  Reviewed plan/location with her in person.

## 2021-08-31 NOTE — Telephone Encounter (Signed)
Pt is scheduled for 10/23/21

## 2021-09-01 LAB — PRO B NATRIURETIC PEPTIDE: NT-Pro BNP: 320 pg/mL — ABNORMAL HIGH (ref 0–301)

## 2021-09-01 LAB — CBC
Hematocrit: 42.6 % (ref 34.0–46.6)
Hemoglobin: 14.2 g/dL (ref 11.1–15.9)
MCH: 29.2 pg (ref 26.6–33.0)
MCHC: 33.3 g/dL (ref 31.5–35.7)
MCV: 88 fL (ref 79–97)
Platelets: 283 10*3/uL (ref 150–450)
RBC: 4.87 x10E6/uL (ref 3.77–5.28)
RDW: 13.9 % (ref 11.7–15.4)
WBC: 9.5 10*3/uL (ref 3.4–10.8)

## 2021-09-01 LAB — BASIC METABOLIC PANEL
BUN/Creatinine Ratio: 17 (ref 12–28)
BUN: 22 mg/dL (ref 8–27)
CO2: 24 mmol/L (ref 20–29)
Calcium: 8.8 mg/dL (ref 8.7–10.3)
Chloride: 103 mmol/L (ref 96–106)
Creatinine, Ser: 1.27 mg/dL — ABNORMAL HIGH (ref 0.57–1.00)
Glucose: 86 mg/dL (ref 65–99)
Potassium: 5.2 mmol/L (ref 3.5–5.2)
Sodium: 143 mmol/L (ref 134–144)
eGFR: 44 mL/min/{1.73_m2} — ABNORMAL LOW (ref >59)

## 2021-09-13 ENCOUNTER — Other Ambulatory Visit: Payer: Self-pay

## 2021-09-13 ENCOUNTER — Encounter: Payer: Self-pay | Admitting: Internal Medicine

## 2021-09-13 ENCOUNTER — Ambulatory Visit (INDEPENDENT_AMBULATORY_CARE_PROVIDER_SITE_OTHER): Payer: Medicare Other | Admitting: Internal Medicine

## 2021-09-13 VITALS — BP 128/66 | HR 58 | Ht 64.0 in | Wt 196.0 lb

## 2021-09-13 DIAGNOSIS — R609 Edema, unspecified: Secondary | ICD-10-CM | POA: Diagnosis not present

## 2021-09-13 DIAGNOSIS — I2510 Atherosclerotic heart disease of native coronary artery without angina pectoris: Secondary | ICD-10-CM

## 2021-09-13 DIAGNOSIS — R0602 Shortness of breath: Secondary | ICD-10-CM | POA: Diagnosis not present

## 2021-09-13 NOTE — Progress Notes (Signed)
Cardiology Office Note   Date:  09/13/2021   ID:  [name], DOB 1946/06/06, MRN [id number]  PCP:  Mosie Lukes, MD  Cardiologist:   Dorris Carnes, MD   F/U of CAD    History of Present Illness:  [name] is a 75 y.o. female with CAD - s/p NSTEMI 10/2011 with DESx2   Other issues include thyroidectomy, GERD, HTN, HLD, vertigo.  Last myovue showed inferolateral scar with minimal periinfarct ischemia  The pt was seen in ED for CP in December 2019   L heart cath in Dec 2019 showed:  LAD with 25%  OM  with 100% with Left to R  collaterals  RCA with patent stent   Normal LVEF    Echo in early Sept 2020   LVEF and RVEF were normal   Mild diastolic dysfunction     I lat saw the pt last spring   Since seen she went to [place]   Had swelling in leg   Big nodular    She  says she was told she had a clot but was not put on blood thinner    Stayed at bed rest  Since then the swelling has gone   She still has discomfort with standing    I set up for a USN  Neg for DVT  Breathing is OK   She denies CP   No edema now  Current Meds  Medication Sig   ALPRAZolam (XANAX) 0.25 MG tablet Take 1 tablet (0.25 mg total) by mouth 2 (two) times daily as needed for anxiety.   amLODipine (NORVASC) 10 MG tablet Take 1 tablet (10 mg total) by mouth daily.   aspirin EC 81 MG tablet Take 81 mg by mouth daily.   B Complex-C (B-COMPLEX WITH VITAMIN C) tablet Take 1 tablet by mouth daily.   bisoprolol (ZEBETA) 5 MG tablet Take 1 tablet (5 mg total) by mouth daily.   calcitRIOL (ROCALTROL) 0.25 MCG capsule Take 3 capsules (0.75 mcg total) by mouth daily.   Cholecalciferol (VITAMIN D-3) 25 MCG (1000 UT) CAPS Take 3000-4000 units by mouth daily   clopidogrel (PLAVIX) 75 MG tablet TAKE 1 TABLET BY MOUTH  DAILY   Evolocumab (REPATHA SURECLICK) 376 MG/ML SOAJ INJECT 140 MG  SUBCUTANEOUSLY EVERY 2  WEEKS   fluconazole (DIFLUCAN) 150 MG tablet Take 1 tablet (150 mg total) by mouth once a week.    furosemide (LASIX) 20 MG tablet TAKE 1 TABLET BY MOUTH ONCE WEEKLY AS NEEDED   isosorbide mononitrate (IMDUR) 60 MG 24 hr tablet Take 1 tablet (60 mg total) by mouth daily.   ketoconazole (NIZORAL) 2 % shampoo Apply 1 application topically 2 (two) times a week.   levothyroxine (SYNTHROID) 112 MCG tablet TAKE 1 TABLET BY MOUTH  DAILY   LOTEMAX SM 0.38 % GEL    Multiple Vitamin (MULTIVITAMIN WITH MINERALS) TABS tablet Take 1 tablet by mouth daily.   neomycin-polymyxin-dexameth (MAXITROL) 0.1 % OINT neomycin 3.5 mg/g-polymyxin B 10,000 unit/g-dexameth 0.1 % eye oint   nitroGLYCERIN (NITROSTAT) 0.4 MG SL tablet Place 1 tablet (0.4 mg total) under the tongue every 5 (five) minutes x 3 doses as needed for chest pain.   pantoprazole (PROTONIX) 40 MG tablet TAKE 1 TABLET BY MOUTH  DAILY   PROLENSA 0.07 % SOLN    rosuvastatin (CRESTOR) 40 MG tablet TAKE 1 TABLET BY MOUTH  DAILY   traMADol (ULTRAM) 50 MG tablet tramadol 50 mg tablet  TK 1  T PO TID PRN     Allergies:   Zetia [ezetimibe]   Past Medical History:  Diagnosis Date   Acute MI, subendocardial ([place])    Arthritis    Back pain 05/31/2013   Benign paroxysmal positional vertigo 08/05/2016   CAD (coronary artery disease)    a. NSTEMI 10/2011: [place] 11/19/11: pLAD 30%, oOM 60%, AVCFX 30%, CFX after OM2 30%, pRCA 60 and 70%, then 99%, AM 80-90% with TIMI 3 flow.  PCI: Promus DES x 2 to RCA; b. 06/2012 Cath: patent RCA stents w/ subtl occl of Acute Marginal (jailed)->Med rx; c. 05/2015 MV: EF 59%, mod mid infsept/inf/ap lat/ap infarct with peri-infarct isch-->Med Rx; d. 02/2016 Cath: LM nl, LAD 21m, RI 50, RCA patent stents.   Colitis    Facial skin lesion 01/28/2017   GERD (gastroesophageal reflux disease)    occasional   Hyperlipidemia    Hypertension    Hypertensive heart disease    a. Echocardiogram 11/19/11: Difficult acoustic windows, EF 60-65%, normal LV wall thickness, grade 1 diastolic dysfunction   Hypoparathyroidism ([facility])    Low zinc level  11/26/2016   Multinodular thyroid    Goiter s/p thyroidectomy in 2007 with post-op  hypocalcemia and post-op hypothyroidism/hypoparathyroidism with hypocalcemia   Neck pain on right side 07/13/2013   Nocturia 05/31/2013   Osteoarthritis    Pain in right axilla 03/13/2016   Palpitations    a. 03/2012 - patient set up for event monitor but did not wear correctly -  she declined wearing a repeat monitor   Post-surgical hypothyroidism    Sun-damaged skin 12/05/2014   Tubular adenoma of colon 06/2011   Unspecified constipation 05/31/2013   Vertigo    Zinc deficiency 11/26/2015    Past Surgical History:  Procedure Laterality Date   CARDIAC CATHETERIZATION N/A 03/08/2016   Procedure: Left Heart Cath and Coronary Angiography;  Surgeon: Jettie Booze, MD;  Location: [place] [facility];  Service: Cardiovascular;  Laterality: N/A;   COLONOSCOPY  Aenomatous polyps   07/05/2011   COLONOSCOPY N/A 09/28/2014   Procedure: COLONOSCOPY;  Surgeon: Ladene Artist, MD;  [facility];  Service: Endoscopy;  Laterality: N/A;   CORONARY ANGIOPLASTY WITH STENT PLACEMENT     LEFT HEART CATH AND CORONARY ANGIOGRAPHY N/A 12/09/2018   Procedure: LEFT HEART CATH AND CORONARY ANGIOGRAPHY;  Surgeon: Jettie Booze, MD;  Location: [place] [facility];  Service: Cardiovascular;  Laterality: N/A;   LEFT HEART CATHETERIZATION WITH CORONARY ANGIOGRAM N/A 07/17/2012   Procedure: LEFT HEART CATHETERIZATION WITH CORONARY ANGIOGRAM;  Surgeon: Hillary Bow, MD;  Location: [facility] C[facility] LAB;  Service: Cardiovascular;  Laterality: N/A;   PERCUTANEOUS CORONARY STENT INTERVENTION (PCI-S) N/A 11/19/2011   Procedure: PERCUTANEOUS CORONARY STENT INTERVENTION (PCI-S);  Surgeon: Hillary Bow, MD;  Location: [facility] CATH LAB;  Service: Cardiovascular;  Laterality: N/A;   POLYPECTOMY     SHOULDER ARTHROSCOPY W/ ROTATOR CUFF REPAIR     right   TOTAL THYROIDECTOMY  2007   GOITER     Social History:  The patient  reports that  she has never smoked. She has never used smokeless tobacco. She reports that she does not currently use alcohol. She reports that she does not use drugs.   Family History:  The patient's family history includes Blindness in her sister; Cancer in her brother and brother; Colon cancer in her brother; Congestive Heart Failure in her sister; Healthy in her daughter, daughter, daughter, and son; Heart attack in her father; Heart  disease in her father; Hypertension in her father and sister; Thyroid disease in her sister.    ROS:  Please see the history of present illness. All other systems are reviewed and  Negative to the above problem except as noted.    PHYSICAL EXAM: VS:  BP 128/66   Pulse (!) 58   Ht 5\' 4"  (1.626 m)   Wt 196 lb (88.9 kg)   SpO2 96%   BMI 33.64 kg/m   GEN: Well nourished, well developed, in no acute distress  HEENT: normal  Neck: JVP is normal  No carotid bruits Cardiac: RRR; no murmurs,   No LE  edema  Respiratory:  clear to auscultation bilaterally GI: soft, nontender, nondistended, + BS  No hepatomegaly  MS: no deformity Moving all extremities   Skin: warm and dry, no rash Neuro:  Strength and sensation are intact Psych: euthymic mood, full affect   EKG:  EKG is ordered today.  SB 58 bpm   RBBB   [place] PVC   Lipid Panel    Component Value Date/Time   CHOL 161 12/28/2020 1020   CHOL 121 09/12/2020 1639   TRIG 201.0 (H) 12/28/2020 1020   HDL 47.20 12/28/2020 1020   HDL 43 09/12/2020 1639   CHOLHDL 3 12/28/2020 1020   VLDL 40.2 (H) 12/28/2020 1020   LDLCALC 45 09/12/2020 1639   LDLDIRECT 84.0 12/28/2020 1020      Wt Readings from Last 3 Encounters:  09/13/21 196 lb (88.9 kg)  02/22/21 202 lb (91.6 kg)  01/16/21 202 lb (91.6 kg)      ASSESSMENT AND PLAN:  1  CAD   Remains asymptomatic on current regimen   COntinue     2   HTN   BP is well controlled  Keep on current regimen  3  HL   HL   Keep on PCSK9I   Check labst today  Check BMET,  BNP, lpids       F/U in clnic in 6 months    Current medicines are reviewed at length with the patient today.  The patient does not have concerns regarding medicines.  Signed, Dorris Carnes, MD  09/13/2021 11:48 AM    [facility] [address], [place], [place]  [address] Phone: [phone]; Fax: [phone]

## 2021-09-13 NOTE — Patient Instructions (Signed)
Medication Instructions:  No changes  *If you need a refill on your cardiac medications before your next appointment, please call your pharmacy*   Lab Work: Today: bmet, pro bnp, lipids   Other Instructions

## 2021-09-14 ENCOUNTER — Other Ambulatory Visit: Payer: Self-pay | Admitting: Family Medicine

## 2021-09-14 LAB — LIPID PANEL
Chol/HDL Ratio: 4.2 ratio (ref 0.0–4.4)
Cholesterol, Total: 175 mg/dL (ref 100–199)
HDL: 42 mg/dL (ref >39)
LDL Chol Calc (NIH): 92 mg/dL (ref 0–99)
Triglycerides: 246 mg/dL — ABNORMAL HIGH (ref 0–149)
VLDL Cholesterol Cal: 41 mg/dL — ABNORMAL HIGH (ref 5–40)

## 2021-09-14 LAB — BASIC METABOLIC PANEL
BUN/Creatinine Ratio: 23 (ref 12–28)
BUN: 26 mg/dL (ref 8–27)
CO2: 25 mmol/L (ref 20–29)
Calcium: 8.9 mg/dL (ref 8.7–10.3)
Chloride: 102 mmol/L (ref 96–106)
Creatinine, Ser: 1.14 mg/dL — ABNORMAL HIGH (ref 0.57–1.00)
Glucose: 93 mg/dL (ref 65–99)
Potassium: 4.7 mmol/L (ref 3.5–5.2)
Sodium: 143 mmol/L (ref 134–144)
eGFR: 51 mL/min/{1.73_m2} — ABNORMAL LOW (ref >59)

## 2021-09-14 LAB — PRO B NATRIURETIC PEPTIDE: NT-Pro BNP: 288 pg/mL (ref 0–301)

## 2021-09-25 ENCOUNTER — Telehealth: Payer: Self-pay | Admitting: Internal Medicine

## 2021-09-25 DIAGNOSIS — I83892 Varicose veins of left lower extremities with other complications: Secondary | ICD-10-CM | POA: Diagnosis not present

## 2021-09-25 DIAGNOSIS — M79605 Pain in left leg: Secondary | ICD-10-CM | POA: Diagnosis not present

## 2021-09-25 DIAGNOSIS — Z79899 Other long term (current) drug therapy: Secondary | ICD-10-CM

## 2021-09-25 DIAGNOSIS — E785 Hyperlipidemia, unspecified: Secondary | ICD-10-CM

## 2021-09-25 NOTE — Telephone Encounter (Signed)
Kidney funciton is back at baseline   1.14   FLuid is up minimally   I would not change meds  Lipids   LDL is 92   was better 1 year ago (45)    Could work on diet, get back to normal REcheck lipids in January     Pt verbalized understanding and will return fasting 01/01/2022 for Lipid panel.

## 2021-09-25 NOTE — Telephone Encounter (Signed)
Patient is requesting to go over her lab results.

## 2021-10-04 DIAGNOSIS — I83892 Varicose veins of left lower extremities with other complications: Secondary | ICD-10-CM | POA: Diagnosis not present

## 2021-10-04 DIAGNOSIS — M79605 Pain in left leg: Secondary | ICD-10-CM | POA: Diagnosis not present

## 2021-10-09 ENCOUNTER — Other Ambulatory Visit: Payer: Self-pay | Admitting: Family Medicine

## 2021-10-09 DIAGNOSIS — I8312 Varicose veins of left lower extremity with inflammation: Secondary | ICD-10-CM | POA: Diagnosis not present

## 2021-10-09 DIAGNOSIS — I83893 Varicose veins of bilateral lower extremities with other complications: Secondary | ICD-10-CM | POA: Diagnosis not present

## 2021-10-09 DIAGNOSIS — I8311 Varicose veins of right lower extremity with inflammation: Secondary | ICD-10-CM | POA: Diagnosis not present

## 2021-10-09 DIAGNOSIS — I83813 Varicose veins of bilateral lower extremities with pain: Secondary | ICD-10-CM | POA: Diagnosis not present

## 2021-10-09 DIAGNOSIS — K2100 Gastro-esophageal reflux disease with esophagitis, without bleeding: Secondary | ICD-10-CM

## 2021-10-20 DIAGNOSIS — M25511 Pain in right shoulder: Secondary | ICD-10-CM | POA: Diagnosis not present

## 2021-10-20 DIAGNOSIS — M19011 Primary osteoarthritis, right shoulder: Secondary | ICD-10-CM | POA: Diagnosis not present

## 2021-10-23 ENCOUNTER — Ambulatory Visit (INDEPENDENT_AMBULATORY_CARE_PROVIDER_SITE_OTHER): Payer: Medicare Other | Admitting: Family Medicine

## 2021-10-23 ENCOUNTER — Encounter: Payer: Self-pay | Admitting: Family Medicine

## 2021-10-23 ENCOUNTER — Other Ambulatory Visit: Payer: Self-pay

## 2021-10-23 VITALS — BP 119/56 | HR 53 | Temp 97.9°F | Resp 12 | Ht 64.0 in | Wt 198.2 lb

## 2021-10-23 DIAGNOSIS — E60 Dietary zinc deficiency: Secondary | ICD-10-CM | POA: Diagnosis not present

## 2021-10-23 DIAGNOSIS — Z23 Encounter for immunization: Secondary | ICD-10-CM

## 2021-10-23 DIAGNOSIS — I10 Essential (primary) hypertension: Secondary | ICD-10-CM | POA: Diagnosis not present

## 2021-10-23 DIAGNOSIS — R002 Palpitations: Secondary | ICD-10-CM

## 2021-10-23 DIAGNOSIS — E569 Vitamin deficiency, unspecified: Secondary | ICD-10-CM

## 2021-10-23 DIAGNOSIS — E890 Postprocedural hypothyroidism: Secondary | ICD-10-CM | POA: Diagnosis not present

## 2021-10-23 DIAGNOSIS — E162 Hypoglycemia, unspecified: Secondary | ICD-10-CM | POA: Diagnosis not present

## 2021-10-23 DIAGNOSIS — E782 Mixed hyperlipidemia: Secondary | ICD-10-CM | POA: Diagnosis not present

## 2021-10-23 DIAGNOSIS — R001 Bradycardia, unspecified: Secondary | ICD-10-CM

## 2021-10-23 DIAGNOSIS — D500 Iron deficiency anemia secondary to blood loss (chronic): Secondary | ICD-10-CM | POA: Diagnosis not present

## 2021-10-23 LAB — COMPREHENSIVE METABOLIC PANEL
ALT: 37 U/L — ABNORMAL HIGH (ref 0–35)
AST: 30 U/L (ref 0–37)
Albumin: 4.1 g/dL (ref 3.5–5.2)
Alkaline Phosphatase: 73 U/L (ref 39–117)
BUN: 26 mg/dL — ABNORMAL HIGH (ref 6–23)
CO2: 29 mEq/L (ref 19–32)
Calcium: 8.4 mg/dL (ref 8.4–10.5)
Chloride: 103 mEq/L (ref 96–112)
Creatinine, Ser: 1.02 mg/dL (ref 0.40–1.20)
GFR: 54.13 mL/min — ABNORMAL LOW (ref >60.00)
Glucose, Bld: 76 mg/dL (ref 70–99)
Potassium: 4.2 mEq/L (ref 3.5–5.1)
Sodium: 141 mEq/L (ref 135–145)
Total Bilirubin: 0.4 mg/dL (ref 0.2–1.2)
Total Protein: 7.1 g/dL (ref 6.0–8.3)

## 2021-10-23 LAB — CBC
HCT: 40.2 % (ref 36.0–46.0)
Hemoglobin: 13.3 g/dL (ref 12.0–15.0)
MCHC: 33 g/dL (ref 30.0–36.0)
MCV: 87.6 fl (ref 78.0–100.0)
Platelets: 301 10*3/uL (ref 150.0–400.0)
RBC: 4.59 Mil/uL (ref 3.87–5.11)
RDW: 14.6 % (ref 11.5–15.5)
WBC: 10.7 10*3/uL — ABNORMAL HIGH (ref 4.0–10.5)

## 2021-10-23 LAB — VITAMIN D 25 HYDROXY (VIT D DEFICIENCY, FRACTURES): VITD: 37.64 ng/mL (ref 30.00–100.00)

## 2021-10-23 LAB — HEMOGLOBIN A1C: Hgb A1c MFr Bld: 5.7 % (ref 4.6–6.5)

## 2021-10-23 LAB — VITAMIN B12: Vitamin B-12: 452 pg/mL (ref 211–911)

## 2021-10-23 LAB — TSH: TSH: 1.18 u[IU]/mL (ref 0.35–5.50)

## 2021-10-23 NOTE — Assessment & Plan Note (Signed)
hgba1c acceptable, minimize simple carbs. Increase exercise as tolerated. Asymptomatic and no major low numbers

## 2021-10-23 NOTE — Assessment & Plan Note (Signed)
Encourage heart healthy diet such as MIND or DASH diet, increase exercise, avoid trans fats, simple carbohydrates and processed foods, consider a krill or fish or flaxseed oil cap daily.  Tolerating Rosuvastatin 

## 2021-10-23 NOTE — Assessment & Plan Note (Signed)
Well controlled, no changes to meds. Encouraged heart healthy diet such as the DASH diet and exercise as tolerated.  °

## 2021-10-23 NOTE — Assessment & Plan Note (Signed)
Increase leafy greens, consider increased lean red meat and using cast iron cookware. Continue to monitor, report any concerns 

## 2021-10-23 NOTE — Assessment & Plan Note (Signed)
Supplement and monitor 

## 2021-10-23 NOTE — Patient Instructions (Addendum)
[place] makes good vitamins  Hypertension, Adult High blood pressure (hypertension) is when the force of blood pumping through the arteries is too strong. The arteries are the blood vessels that carry blood from the heart throughout the body. Hypertension forces the heart to work harder to pump blood and may cause arteries to become narrow or stiff. Untreated or uncontrolled hypertension can cause a heart attack, heart failure, a stroke, kidney disease, and other problems. A blood pressure reading consists of a higher number over a lower number. Ideally, your blood pressure should be below 120/80. The first ("top") number is called the systolic pressure. It is a measure of the pressure in your arteries as your heart beats. The second ("bottom") number is called the diastolic pressure. It is a measure of the pressure in your arteries as the heart relaxes. What are the causes? The exact cause of this condition is not known. There are some conditions that result in or are related to high blood pressure. What increases the risk? Some risk factors for high blood pressure are under your control. The following factors may make you more likely to develop this condition: Smoking. Having type 2 diabetes mellitus, high cholesterol, or both. Not getting enough exercise or physical activity. Being overweight. Having too much fat, sugar, calories, or salt (sodium) in your diet. Drinking too much alcohol. Some risk factors for high blood pressure may be difficult or impossible to change. Some of these factors include: Having chronic kidney disease. Having a family history of high blood pressure. Age. Risk increases with age. Race. You may be at higher risk if you are African American. Gender. Men are at higher risk than women before age 41. After age 55, women are at higher risk than men. Having obstructive sleep apnea. Stress. What are the signs or symptoms? High blood pressure may not cause symptoms.  Very high blood pressure (hypertensive crisis) may cause: Headache. Anxiety. Shortness of breath. Nosebleed. Nausea and vomiting. Vision changes. Severe chest pain. Seizures. How is this diagnosed? This condition is diagnosed by measuring your blood pressure while you are seated, with your arm resting on a flat surface, your legs uncrossed, and your feet flat on the floor. The cuff of the blood pressure monitor will be placed directly against the skin of your upper arm at the level of your heart. It should be measured at least twice using the same arm. Certain conditions can cause a difference in blood pressure between your right and left arms. Certain factors can cause blood pressure readings to be lower or higher than normal for a short period of time: When your blood pressure is higher when you are in a health care provider's office than when you are at home, this is called white coat hypertension. Most people with this condition do not need medicines. When your blood pressure is higher at home than when you are in a health care provider's office, this is called masked hypertension. Most people with this condition may need medicines to control blood pressure. If you have a high blood pressure reading during one visit or you have normal blood pressure with other risk factors, you may be asked to: Return on a different day to have your blood pressure checked again. Monitor your blood pressure at home for 1 week or longer. If you are diagnosed with hypertension, you may have other blood or imaging tests to help your health care provider understand your overall risk for other conditions. How is this treated?  This condition is treated by making healthy lifestyle changes, such as eating healthy foods, exercising more, and reducing your alcohol intake. Your health care provider may prescribe medicine if lifestyle changes are not enough to get your blood pressure under control, and if: Your systolic  blood pressure is above 130. Your diastolic blood pressure is above 80. Your personal target blood pressure may vary depending on your medical conditions, your age, and other factors. Follow these instructions at home: Eating and drinking  Eat a diet that is high in fiber and potassium, and low in sodium, added sugar, and fat. An example eating plan is called the DASH (Dietary Approaches to Stop Hypertension) diet. To eat this way: Eat plenty of fresh fruits and vegetables. Try to fill one half of your plate at each meal with fruits and vegetables. Eat whole grains, such as whole-wheat pasta, brown rice, or whole-grain bread. Fill about one fourth of your plate with whole grains. Eat or drink low-fat dairy products, such as skim milk or low-fat yogurt. Avoid fatty cuts of meat, processed or cured meats, and poultry with skin. Fill about one fourth of your plate with lean proteins, such as fish, chicken without skin, beans, eggs, or tofu. Avoid pre-made and processed foods. These tend to be higher in sodium, added sugar, and fat. Reduce your daily sodium intake. Most people with hypertension should eat less than 1,500 mg of sodium a day. Do not drink alcohol if: Your health care provider tells you not to drink. You are pregnant, may be pregnant, or are planning to become pregnant. If you drink alcohol: Limit how much you use to: 0-1 drink a day for women. 0-2 drinks a day for men. Be aware of how much alcohol is in your drink. In the [place] one drink equals one 12 oz bottle of beer (355 mL), one 5 oz glass of wine (148 mL), or one 1 oz glass of hard liquor (44 mL). Lifestyle  Work with your health care provider to maintain a healthy body weight or to lose weight. Ask what an ideal weight is for you. Get at least 30 minutes of exercise most days of the week. Activities may include walking, swimming, or biking. Include exercise to strengthen your muscles (resistance exercise), such as Pilates  or lifting weights, as part of your weekly exercise routine. Try to do these types of exercises for 30 minutes at least 3 days a week. Do not use any products that contain nicotine or tobacco, such as cigarettes, e-cigarettes, and chewing tobacco. If you need help quitting, ask your health care provider. Monitor your blood pressure at home as told by your health care provider. Keep all follow-up visits as told by your health care provider. This is important. Medicines Take over-the-counter and prescription medicines only as told by your health care provider. Follow directions carefully. Blood pressure medicines must be taken as prescribed. Do not skip doses of blood pressure medicine. Doing this puts you at risk for problems and can make the medicine less effective. Ask your health care provider about side effects or reactions to medicines that you should watch for. Contact a health care provider if you: Think you are having a reaction to a medicine you are taking. Have headaches that keep coming back (recurring). Feel dizzy. Have swelling in your ankles. Have trouble with your vision. Get help right away if you: Develop a severe headache or confusion. Have unusual weakness or numbness. Feel faint. Have severe pain in your chest  or abdomen. Vomit repeatedly. Have trouble breathing. Summary Hypertension is when the force of blood pumping through your arteries is too strong. If this condition is not controlled, it may put you at risk for serious complications. Your personal target blood pressure may vary depending on your medical conditions, your age, and other factors. For most people, a normal blood pressure is less than 120/80. Hypertension is treated with lifestyle changes, medicines, or a combination of both. Lifestyle changes include losing weight, eating a healthy, low-sodium diet, exercising more, and limiting alcohol. This information is not intended to replace advice given to you by  your health care provider. Make sure you discuss any questions you have with your health care provider. Document Revised: 08/20/2018 Document Reviewed: 08/20/2018 Elsevier Patient Education  [place].

## 2021-10-23 NOTE — Assessment & Plan Note (Signed)
Asymptomatic, mild, no changes

## 2021-10-23 NOTE — Assessment & Plan Note (Signed)
On Levothyroxine, continue to monitor 

## 2021-10-23 NOTE — Assessment & Plan Note (Signed)
Supplement and monitor. Check Vitamin D and B12

## 2021-10-23 NOTE — Progress Notes (Signed)
Subjective:   By signing my name below, I, [name], attest that this documentation has been prepared under the direction and in the presence of Taylor Canary, MD. 10/23/2021    Patient ID: [name], adult    DOB: 1946/03/24, 75 y.o.   MRN: [id number]  Chief Complaint  Patient presents with   Back Pain   heart beating fast    HPI Patient is in today for an office visit.  She reports she is doing well.  She has been experiencing palpitations and heart skips  intermittently especially in the evening that lasts for less than an hour. She denies sweat and nausea.  She also reports she has been forgetting some things but not big things like turning off the stove or paying bills.   She checks her blood pressure but mostly at night and the readings are good.  She takes 2000UD Vitamin D pills.  She would like to check her vitamin panels and TSH levels.  She has been managing a healthy diet.  She has 3 Covid-19 vaccines at this time and received the flu vaccine today.   Past Medical History:  Diagnosis Date   Acute MI, subendocardial (HCC)    Arthritis    Back pain 05/31/2013   Benign paroxysmal positional vertigo 08/05/2016   CAD (coronary artery disease)    a. NSTEMI 10/2011: LHC 11/19/11: pLAD 30%, oOM 60%, AVCFX 30%, CFX after OM2 30%, pRCA 60 and 70%, then 99%, AM 80-90% with TIMI 3 flow.  PCI: Promus DES x 2 to RCA; b. 06/2012 Cath: patent RCA stents w/ subtl occl of Acute Marginal (jailed)->Med rx; c. 05/2015 MV: EF 59%, mod mid infsept/inf/ap lat/ap infarct with peri-infarct isch-->Med Rx; d. 02/2016 Cath: LM nl, LAD 52m, RI 50, RCA patent stents.   Colitis    Facial skin lesion 01/28/2017   GERD (gastroesophageal reflux disease)    occasional   Hyperlipidemia    Hypertension    Hypertensive heart disease    a. Echocardiogram 11/19/11: Difficult acoustic windows, EF 60-65%, normal LV wall thickness, grade 1 diastolic dysfunction   Hypoparathyroidism (HCC)    Low  zinc level 11/26/2016   Multinodular thyroid    Goiter s/p thyroidectomy in 2007 with post-op  hypocalcemia and post-op hypothyroidism/hypoparathyroidism with hypocalcemia   Neck pain on right side 07/13/2013   Nocturia 05/31/2013   Osteoarthritis    Pain in right axilla 03/13/2016   Palpitations    a. 03/2012 - patient set up for event monitor but did not wear correctly -  she declined wearing a repeat monitor   Post-surgical hypothyroidism    Sun-damaged skin 12/05/2014   Tubular adenoma of colon 06/2011   Unspecified constipation 05/31/2013   Vertigo    Zinc deficiency 11/26/2015    Past Surgical History:  Procedure Laterality Date   CARDIAC CATHETERIZATION N/A 03/08/2016   Procedure: Left Heart Cath and Coronary Angiography;  Surgeon: Taylor Crafts, MD;  Location: [facility] INVASIVE CV LAB;  Service: Cardiovascular;  Laterality: N/A;   COLONOSCOPY  Aenomatous polyps   07/05/2011   COLONOSCOPY N/A 09/28/2014   Procedure: COLONOSCOPY;  Surgeon: Taylor Dare, MD;  Location: [facility] ENDOSCOPY;  Service: Endoscopy;  Laterality: N/A;   CORONARY ANGIOPLASTY WITH STENT PLACEMENT     LEFT HEART CATH AND CORONARY ANGIOGRAPHY N/A 12/09/2018   Procedure: LEFT HEART CATH AND CORONARY ANGIOGRAPHY;  Surgeon: Taylor Crafts, MD;  Location: [facility] INVASIVE CV LAB;  Service: Cardiovascular;  Laterality: N/A;  LEFT HEART CATHETERIZATION WITH CORONARY ANGIOGRAM N/A 07/17/2012   Procedure: LEFT HEART CATHETERIZATION WITH CORONARY ANGIOGRAM;  Surgeon: Taylor Bow, MD;  Location: [facility] CATH LAB;  Service: Cardiovascular;  Laterality: N/A;   PERCUTANEOUS CORONARY STENT INTERVENTION (PCI-S) N/A 11/19/2011   Procedure: PERCUTANEOUS CORONARY STENT INTERVENTION (PCI-S);  Surgeon: Taylor Bow, MD;  Location: [facility] CATH LAB;  Service: Cardiovascular;  Laterality: N/A;   POLYPECTOMY     SHOULDER ARTHROSCOPY W/ ROTATOR CUFF REPAIR     right   TOTAL THYROIDECTOMY  2007   GOITER    Family History  Problem Relation  Age of Onset   Colon cancer Brother    Cancer Brother        COLON   Hypertension Father    Heart disease Father    Heart attack Father    Blindness Sister    Congestive Heart Failure Sister    Hypertension Sister    Healthy Daughter    Healthy Son    Thyroid disease Sister    Cancer Brother        multiple myelomas   Healthy Daughter    Healthy Daughter    Diabetes Neg Hx    Prostate cancer Neg Hx    Breast cancer Neg Hx    Esophageal cancer Neg Hx    Rectal cancer Neg Hx    Stomach cancer Neg Hx     Social History   Socioeconomic History   Marital status: Married    Spouse name: Not on file   Number of children: 4   Years of education: 14   Highest education level: Not on file  Occupational History   Occupation: Sales person at [employer]   Smoking status: Never   Smokeless tobacco: Never  Vaping Use   Vaping Use: Never used  Substance and Sexual Activity   Alcohol use: Not Currently   Drug use: No   Sexual activity: Not Currently  Other Topics Concern   Not on file  Social History Narrative   Lives at home alone.  Her son lives near her.   Right-handed.   1 cup coffee per day.   Social Determinants of Health   Financial Resource Strain: Not on file  Food Insecurity: Not on file  Transportation Needs: Not on file  Physical Activity: Not on file  Stress: Not on file  Social Connections: Not on file  Intimate Partner Violence: Not on file    Outpatient Medications Prior to Visit  Medication Sig Dispense Refill   ALPRAZolam (XANAX) 0.25 MG tablet Take 1 tablet (0.25 mg total) by mouth 2 (two) times daily as needed for anxiety. 30 tablet 0   amLODipine (NORVASC) 10 MG tablet Take 1 tablet (10 mg total) by mouth daily. 90 tablet 3   aspirin EC 81 MG tablet Take 81 mg by mouth daily.     B Complex-C (B-COMPLEX WITH VITAMIN C) tablet Take 1 tablet by mouth daily.     bisoprolol (ZEBETA) 5 MG tablet Take 1 tablet (5 mg total) by mouth daily. 90  tablet 3   calcitRIOL (ROCALTROL) 0.25 MCG capsule Take 3 capsules (0.75 mcg total) by mouth daily. 540 capsule 1   Cholecalciferol (VITAMIN D-3) 25 MCG (1000 UT) CAPS Take 3000-4000 units by mouth daily 60 capsule 0   clopidogrel (PLAVIX) 75 MG tablet TAKE 1 TABLET BY MOUTH  DAILY 90 tablet 3   Evolocumab (REPATHA SURECLICK) 528 MG/ML SOAJ INJECT 140 MG  SUBCUTANEOUSLY EVERY 2  WEEKS 6 mL 3   fluconazole (DIFLUCAN) 150 MG tablet Take 1 tablet (150 mg total) by mouth once a week. 4 tablet 2   furosemide (LASIX) 20 MG tablet TAKE 1 TABLET BY MOUTH ONCE WEEKLY AS NEEDED 15 tablet 3   isosorbide mononitrate (IMDUR) 60 MG 24 hr tablet Take 1 tablet (60 mg total) by mouth daily. 90 tablet 3   ketoconazole (NIZORAL) 2 % shampoo Apply 1 application topically 2 (two) times a week. 120 mL 0   levothyroxine (SYNTHROID) 112 MCG tablet Take 1 tablet (112 mcg total) by mouth daily before breakfast. 90 tablet 1   LOTEMAX SM 0.38 % GEL      Multiple Vitamin (MULTIVITAMIN WITH MINERALS) TABS tablet Take 1 tablet by mouth daily.     neomycin-polymyxin-dexameth (MAXITROL) 0.1 % OINT neomycin 3.5 mg/g-polymyxin B 10,000 unit/g-dexameth 0.1 % eye oint     nitroGLYCERIN (NITROSTAT) 0.4 MG SL tablet Place 1 tablet (0.4 mg total) under the tongue every 5 (five) minutes x 3 doses as needed for chest pain. 25 tablet 3   pantoprazole (PROTONIX) 40 MG tablet TAKE 1 TABLET BY MOUTH  DAILY 90 tablet 3   PROLENSA 0.07 % SOLN      rosuvastatin (CRESTOR) 40 MG tablet TAKE 1 TABLET BY MOUTH  DAILY 90 tablet 3   traMADol (ULTRAM) 50 MG tablet tramadol 50 mg tablet  TK 1 T PO TID PRN     No facility-administered medications prior to visit.    Allergies  Allergen Reactions   Zetia [Ezetimibe] Other (See Comments)    Myalgia, paresthesias and weakness    Review of Systems  Constitutional:  Negative for fever and malaise/fatigue.  HENT:  Negative for congestion.   Eyes:  Negative for redness.  Respiratory:  Negative for  shortness of breath.   Cardiovascular:  Positive for palpitations. Negative for chest pain and leg swelling.       (+) heart skips  Gastrointestinal:  Negative for abdominal pain, blood in stool and nausea.  Genitourinary:  Negative for dysuria and frequency.  Musculoskeletal:  Negative for falls.  Skin:  Negative for rash.  Neurological:  Negative for dizziness, loss of consciousness and headaches.  Endo/Heme/Allergies:  Negative for polydipsia.  Psychiatric/Behavioral:  Negative for depression. The patient is not nervous/anxious.       Objective:    Physical Exam Constitutional:      General: She is not in acute distress.    Appearance: She is well-developed.  HENT:     Head: Normocephalic and atraumatic.  Eyes:     Conjunctiva/sclera: Conjunctivae normal.  Neck:     Thyroid: No thyromegaly.  Cardiovascular:     Rate and Rhythm: Normal rate and regular rhythm.     Heart sounds: Normal heart sounds. No murmur heard. Pulmonary:     Effort: Pulmonary effort is normal. No respiratory distress.     Breath sounds: Normal breath sounds.  Abdominal:     General: Bowel sounds are normal. There is no distension.     Palpations: Abdomen is soft. There is no mass.     Tenderness: There is no abdominal tenderness.  Musculoskeletal:     Cervical back: Neck supple.  Lymphadenopathy:     Cervical: No cervical adenopathy.  Skin:    General: Skin is warm and dry.  Neurological:     Mental Status: She is alert and oriented to person, place, and time.  Psychiatric:        Behavior: Behavior  normal.    BP (!) 119/56 (BP Location: Left Arm, Cuff Size: Large)   Pulse (!) 53   Temp 97.9 F (36.6 C) (Oral)   Resp 12   Ht $R'5\' 4"'Ig$  (1.626 m)   Wt 198 lb 3.2 oz (89.9 kg)   SpO2 98%   BMI 34.02 kg/m  Wt Readings from Last 3 Encounters:  10/23/21 198 lb 3.2 oz (89.9 kg)  09/13/21 196 lb (88.9 kg)  02/22/21 202 lb (91.6 kg)    Diabetic Foot Exam - Simple   No data filed    Lab  Results  Component Value Date   WBC 9.5 08/31/2021   HGB 14.2 08/31/2021   HCT 42.6 08/31/2021   PLT 283 08/31/2021   GLUCOSE 93 09/13/2021   CHOL 175 09/13/2021   TRIG 246 (H) 09/13/2021   HDL 42 09/13/2021   LDLDIRECT 84.0 12/28/2020   LDLCALC 92 09/13/2021   ALT 11 12/28/2020   AST 11 12/28/2020   NA 143 09/13/2021   K 4.7 09/13/2021   CL 102 09/13/2021   CREATININE 1.14 (H) 09/13/2021   BUN 26 09/13/2021   CO2 25 09/13/2021   TSH 1.65 12/28/2020   INR 0.86 03/07/2016   HGBA1C 5.8 12/28/2020    Lab Results  Component Value Date   TSH 1.65 12/28/2020   Lab Results  Component Value Date   WBC 9.5 08/31/2021   HGB 14.2 08/31/2021   HCT 42.6 08/31/2021   MCV 88 08/31/2021   PLT 283 08/31/2021   Lab Results  Component Value Date   NA 143 09/13/2021   K 4.7 09/13/2021   CO2 25 09/13/2021   GLUCOSE 93 09/13/2021   BUN 26 09/13/2021   CREATININE 1.14 (H) 09/13/2021   BILITOT 0.4 12/28/2020   ALKPHOS 84 12/28/2020   AST 11 12/28/2020   ALT 11 12/28/2020   PROT 7.1 12/28/2020   ALBUMIN 4.1 12/28/2020   CALCIUM 8.9 09/13/2021   ANIONGAP 7 02/25/2019   EGFR 51 (L) 09/13/2021   GFR 47.15 (L) 12/28/2020   Lab Results  Component Value Date   CHOL 175 09/13/2021   Lab Results  Component Value Date   HDL 42 09/13/2021   Lab Results  Component Value Date   LDLCALC 92 09/13/2021   Lab Results  Component Value Date   TRIG 246 (H) 09/13/2021   Lab Results  Component Value Date   CHOLHDL 4.2 09/13/2021   Lab Results  Component Value Date   HGBA1C 5.8 12/28/2020       Assessment & Plan:   Problem List Items Addressed This Visit     Hypothyroidism    On Levothyroxine, continue to monitor      Hyperlipidemia    Encourage heart healthy diet such as MIND or DASH diet, increase exercise, avoid trans fats, simple carbohydrates and processed foods, consider a krill or fish or flaxseed oil cap daily. Tolerating Rosuvastatin      Essential  hypertension, benign    Well controlled, no changes to meds. Encouraged heart healthy diet such as the DASH diet and exercise as tolerated.       Bradycardia    Asymptomatic, mild, no changes      Anemia    Increase leafy greens, consider increased lean red meat and using cast iron cookware. Continue to monitor, report any concerns      Zinc deficiency    Supplement and monitor      Relevant Orders   Zinc   Hypoglycemia  hgba1c acceptable, minimize simple carbs. Increase exercise as tolerated. Asymptomatic and no major low numbers      Relevant Orders   Hemoglobin A1c   Vitamin deficiency    Supplement and monitor. Check Vitamin D and B12      Relevant Orders   VITAMIN D 25 Hydroxy (Vit-D Deficiency, Fractures)   Vitamin B12   Other Visit Diagnoses     Influenza vaccine administered    -  Primary   Relevant Orders   Flu Vaccine QUAD High Dose(Fluad)   Palpitation       Relevant Orders   CBC   Comprehensive metabolic panel   TSH       No orders of the defined types were placed in this encounter.   I,[name],acting as a Education administrator for Penni Homans, MD.,have documented all relevant documentation on the behalf of Penni Homans, MD,as directed by  Penni Homans, MD while in the presence of Penni Homans, MD.   I, Mosie Lukes, MD., personally preformed the services described in this documentation.  All medical record entries made by the scribe were at my direction and in my presence.  I have reviewed the chart and discharge instructions (if applicable) and agree that the record reflects my personal performance and is accurate and complete. 10/23/2021

## 2021-10-24 ENCOUNTER — Encounter: Payer: Self-pay | Admitting: *Deleted

## 2021-10-25 ENCOUNTER — Other Ambulatory Visit: Payer: Self-pay | Admitting: *Deleted

## 2021-10-25 DIAGNOSIS — E60 Dietary zinc deficiency: Secondary | ICD-10-CM

## 2021-10-25 LAB — ZINC: Zinc: 59 ug/dL — ABNORMAL LOW (ref 60–130)

## 2021-11-05 DIAGNOSIS — L299 Pruritus, unspecified: Secondary | ICD-10-CM | POA: Diagnosis not present

## 2021-11-13 DIAGNOSIS — M5416 Radiculopathy, lumbar region: Secondary | ICD-10-CM | POA: Diagnosis not present

## 2021-11-13 DIAGNOSIS — M5412 Radiculopathy, cervical region: Secondary | ICD-10-CM | POA: Diagnosis not present

## 2021-11-22 DIAGNOSIS — I83892 Varicose veins of left lower extremities with other complications: Secondary | ICD-10-CM | POA: Diagnosis not present

## 2021-11-24 DIAGNOSIS — I83891 Varicose veins of right lower extremities with other complications: Secondary | ICD-10-CM | POA: Diagnosis not present

## 2021-11-24 DIAGNOSIS — Z09 Encounter for follow-up examination after completed treatment for conditions other than malignant neoplasm: Secondary | ICD-10-CM | POA: Diagnosis not present

## 2021-11-24 DIAGNOSIS — I87392 Chronic venous hypertension (idiopathic) with other complications of left lower extremity: Secondary | ICD-10-CM | POA: Diagnosis not present

## 2021-11-28 DIAGNOSIS — I83892 Varicose veins of left lower extremities with other complications: Secondary | ICD-10-CM | POA: Diagnosis not present

## 2021-11-28 DIAGNOSIS — I83813 Varicose veins of bilateral lower extremities with pain: Secondary | ICD-10-CM | POA: Diagnosis not present

## 2021-11-28 DIAGNOSIS — Z09 Encounter for follow-up examination after completed treatment for conditions other than malignant neoplasm: Secondary | ICD-10-CM | POA: Diagnosis not present

## 2021-12-11 ENCOUNTER — Ambulatory Visit (INDEPENDENT_AMBULATORY_CARE_PROVIDER_SITE_OTHER): Payer: Medicare Other | Admitting: Family Medicine

## 2021-12-11 ENCOUNTER — Encounter: Payer: Self-pay | Admitting: Family Medicine

## 2021-12-11 ENCOUNTER — Telehealth: Payer: Self-pay | Admitting: Family Medicine

## 2021-12-11 VITALS — BP 122/62 | HR 78 | Temp 97.6°F | Resp 16 | Ht 64.0 in | Wt 195.8 lb

## 2021-12-11 DIAGNOSIS — L989 Disorder of the skin and subcutaneous tissue, unspecified: Secondary | ICD-10-CM

## 2021-12-11 DIAGNOSIS — E890 Postprocedural hypothyroidism: Secondary | ICD-10-CM | POA: Diagnosis not present

## 2021-12-11 DIAGNOSIS — I214 Non-ST elevation (NSTEMI) myocardial infarction: Secondary | ICD-10-CM | POA: Diagnosis not present

## 2021-12-11 DIAGNOSIS — I519 Heart disease, unspecified: Secondary | ICD-10-CM

## 2021-12-11 DIAGNOSIS — D500 Iron deficiency anemia secondary to blood loss (chronic): Secondary | ICD-10-CM | POA: Diagnosis not present

## 2021-12-11 DIAGNOSIS — M79644 Pain in right finger(s): Secondary | ICD-10-CM | POA: Diagnosis not present

## 2021-12-11 DIAGNOSIS — E60 Dietary zinc deficiency: Secondary | ICD-10-CM

## 2021-12-11 DIAGNOSIS — I739 Peripheral vascular disease, unspecified: Secondary | ICD-10-CM | POA: Diagnosis not present

## 2021-12-11 DIAGNOSIS — I83892 Varicose veins of left lower extremities with other complications: Secondary | ICD-10-CM | POA: Diagnosis not present

## 2021-12-11 DIAGNOSIS — R768 Other specified abnormal immunological findings in serum: Secondary | ICD-10-CM

## 2021-12-11 DIAGNOSIS — I10 Essential (primary) hypertension: Secondary | ICD-10-CM | POA: Diagnosis not present

## 2021-12-11 DIAGNOSIS — F419 Anxiety disorder, unspecified: Secondary | ICD-10-CM

## 2021-12-11 DIAGNOSIS — E782 Mixed hyperlipidemia: Secondary | ICD-10-CM | POA: Diagnosis not present

## 2021-12-11 DIAGNOSIS — M79645 Pain in left finger(s): Secondary | ICD-10-CM

## 2021-12-11 NOTE — Telephone Encounter (Signed)
Patient needs an OV follow up with Charlett Blake, a week after her labs are done on the first week of February, 2023. Please advice.   "Return for needs a lab appt in first week of February and then a f/u with me in the following"

## 2021-12-11 NOTE — Patient Instructions (Addendum)
Multivitamin with minerals and fish or krill or flaxseed oil and Biotin for healthy hair, skin and nails  Shingrix is the new shingles shot, 2 shots over 2-6 months, confirm coverage with insurance and document, then can return here for shots with nurse appt or at pharmacy in January 2023    Dehydration, Adult Dehydration is a condition in which there is not enough water or other fluids in the body. This happens when a person loses more fluids than he or she takes in. Important organs, such as the kidneys, brain, and heart, cannot function without a proper amount of fluids. Any loss of fluids from the body can lead to dehydration. Dehydration can be mild, moderate, or severe. It should be treated right away to prevent it from becoming severe. What are the causes? Dehydration may be caused by: Conditions that cause loss of water or other fluids, such as diarrhea, vomiting, or sweating or urinating a lot. Not drinking enough fluids, especially when you are ill or doing activities that require a lot of energy. Other illnesses and conditions, such as fever or infection. Certain medicines, such as medicines that remove excess fluid from the body (diuretics). Lack of safe drinking water. Not being able to get enough water and food. What increases the risk? The following factors may make you more likely to develop this condition: Having a long-term (chronic) illness that has not been treated properly, such as diabetes, heart disease, or kidney disease. Being 6 years of age or older. Having a disability. Living in a place that is high in altitude, where thinner, drier air causes more fluid loss. Doing exercises that put stress on your body for a long time (endurance sports). What are the signs or symptoms? Symptoms of dehydration depend on how severe it is. Mild or moderate dehydration Thirst. Dry lips or dry mouth. Dizziness or light-headedness, especially when standing up from a seated  position. Muscle cramps. Dark urine. Urine may be the color of tea. Less urine or tears produced than usual. Headache. Severe dehydration Changes in skin. Your skin may be cold and clammy, blotchy, or pale. Your skin also may not return to normal after being lightly pinched and released. Little or no tears, urine, or sweat. Changes in vital signs, such as rapid breathing and low blood pressure. Your pulse may be weak or may be faster than 100 beats a minute when you are sitting still. Other changes, such as: Feeling very thirsty. Sunken eyes. Cold hands and feet. Confusion. Being very tired (lethargic) or having trouble waking from sleep. Short-term weight loss. Loss of consciousness. How is this diagnosed? This condition is diagnosed based on your symptoms and a physical exam. You may have blood and urine tests to help confirm the diagnosis. How is this treated? Treatment for this condition depends on how severe it is. Treatment should be started right away. Do not wait until dehydration becomes severe. Severe dehydration is an emergency and needs to be treated in a hospital. Mild or moderate dehydration can be treated at home. You may be asked to: Drink more fluids. Drink an oral rehydration solution (ORS). This drink helps restore proper amounts of fluids and salts and minerals in the blood (electrolytes). Severe dehydration can be treated: With IV fluids. By correcting abnormal levels of electrolytes. This is often done by giving electrolytes through a tube that is passed through your nose and into your stomach (nasogastric tube, or NG tube). By treating the underlying cause of dehydration. Follow these  instructions at home: Oral rehydration solution If told by your health care provider, drink an ORS: Make an ORS by following instructions on the package. Start by drinking small amounts, about  cup (120 mL) every 5-10 minutes. Slowly increase how much you drink until you have  taken the amount recommended by your health care provider. Eating and drinking     Drink enough clear fluid to keep your urine pale yellow. If you were told to drink an ORS, finish the ORS first and then start slowly drinking other clear fluids. Drink fluids such as: Water. Do not drink only water. Doing that can lead to hyponatremia, which is having too little salt (sodium) in the body. Water from ice chips you suck on. Fruit juice that you have added water to (diluted fruit juice). Low-calorie sports drinks. Eat foods that contain a healthy balance of electrolytes, such as bananas, oranges, potatoes, tomatoes, and spinach. Do not drink alcohol. Avoid the following: Drinks that contain a lot of sugar. These include high-calorie sports drinks, fruit juice that is not diluted, and soda. Caffeine. Foods that are greasy or contain a lot of fat or sugar. General instructions Take over-the-counter and prescription medicines only as told by your health care provider. Do not take sodium tablets. Doing that can lead to having too much sodium in the body (hypernatremia). Return to your normal activities as told by your health care provider. Ask your health care provider what activities are safe for you. Keep all follow-up visits as told by your health care provider. This is important. Contact a health care provider if: You have muscle cramps, pain, or discomfort, such as: Pain in your abdomen and the pain gets worse or stays in one area (localizes). Stiff neck. You have a rash. You are more irritable than usual. You are sleepier or have a harder time waking than usual. You feel weak or dizzy. You feel very thirsty. Get help right away if you have: Any symptoms of severe dehydration. Symptoms of vomiting, such as: You cannot eat or drink without vomiting. Vomiting gets worse or does not go away. Vomit includes blood or green matter (bile). Symptoms that get worse with treatment. A fever. A  severe headache. Problems with urination or bowel movements, such as: Diarrhea that gets worse or does not go away. Blood in your stool (feces). This may cause stool to look black and tarry. Not urinating, or urinating only a small amount of very dark urine, within 6-8 hours. Trouble breathing. These symptoms may represent a serious problem that is an emergency. Do not wait to see if the symptoms will go away. Get medical help right away. Call your local emergency services (911 in the [place] Do not drive yourself to the hospital. Summary Dehydration is a condition in which there is not enough water or other fluids in the body. This happens when a person loses more fluids than he or she takes in. Treatment for this condition depends on how severe it is. Treatment should be started right away. Do not wait until dehydration becomes severe. Drink enough clear fluid to keep your urine pale yellow. If you were told to drink an oral rehydration solution (ORS), finish the ORS first and then start slowly drinking other clear fluids. Take over-the-counter and prescription medicines only as told by your health care provider. Get help right away if you have any symptoms of severe dehydration. This information is not intended to replace advice given to you by your health care  provider. Make sure you discuss any questions you have with your health care provider. Document Revised: 07/23/2019 Document Reviewed: 07/23/2019 Elsevier Patient Education  [place].

## 2021-12-11 NOTE — Assessment & Plan Note (Signed)
Lesion under right eye. Developed about a month ago. Referred to Derm for evaluate.

## 2021-12-11 NOTE — Assessment & Plan Note (Signed)
Supplement and monitor 

## 2021-12-11 NOTE — Assessment & Plan Note (Signed)
Well controlled, no changes to meds. Encouraged heart healthy diet such as the DASH diet and exercise as tolerated.  °

## 2021-12-11 NOTE — Assessment & Plan Note (Signed)
Very stressed with her husband's poor health. It has been recommended he go to Hospice but they have refused so far

## 2021-12-11 NOTE — Assessment & Plan Note (Signed)
Encourage heart healthy diet such as MIND or DASH diet, increase exercise, avoid trans fats, simple carbohydrates and processed foods, consider a krill or fish or flaxseed oil cap daily.  °

## 2021-12-11 NOTE — Assessment & Plan Note (Signed)
Encouraged moist heat and gentle stretching as tolerated. May try NSAIDs and prescription meds as directed and report if symptoms worsen or seek immediate care. Check ANA, RF, uric acid sed rate but likely OA

## 2021-12-11 NOTE — Progress Notes (Signed)
Subjective:   By signing my name below, I, [name], attest that this documentation has been prepared under the direction and in the presence of Mosie Lukes, MD. 12/11/2021   Patient ID: [name], adult    DOB: July 29, 1946, 75 y.o.   MRN: [id number]  Chief Complaint  Patient presents with   Follow-up    HPI Patient is in today for an office visit.  She mentions having a non-tender lesion under her right eye that has been there for a month.   She also reports thinning nails and dry skin and hair for the past month. Her thyroid levels at her last lab work was normal. She adds that she does not drink a lot of water and drinks maybe a cup of tea daily.   She has an upcoming appointment with Ortho for a steroid injection to manage the pain in her neck and back.   She suspects she has gout because there has been swelling and redness in her thumb joints.  She is not interested in the Covid-19 booster vaccine. Will schedule the shingles vaccine for next year. She has received the flu vaccine.   Past Medical History:  Diagnosis Date   Acute MI, subendocardial ([place])    Arthritis    Back pain 05/31/2013   Benign paroxysmal positional vertigo 08/05/2016   CAD (coronary artery disease)    a. NSTEMI 10/2011: [place] 11/19/11: pLAD 30%, oOM 60%, AVCFX 30%, CFX after OM2 30%, pRCA 60 and 70%, then 99%, AM 80-90% with TIMI 3 flow.  PCI: Promus DES x 2 to RCA; b. 06/2012 Cath: patent RCA stents w/ subtl occl of Acute Marginal (jailed)->Med rx; c. 05/2015 MV: EF 59%, mod mid infsept/inf/ap lat/ap infarct with peri-infarct isch-->Med Rx; d. 02/2016 Cath: LM nl, LAD 42m RI 50, RCA patent stents.   Colitis    Facial skin lesion 01/28/2017   GERD (gastroesophageal reflux disease)    occasional   Hyperlipidemia    Hypertension    Hypertensive heart disease    a. Echocardiogram 11/19/11: Difficult acoustic windows, EF 60-65%, normal LV wall thickness, grade 1 diastolic dysfunction    Hypoparathyroidism (HCC)    Low zinc level 11/26/2016   Multinodular thyroid    Goiter s/p thyroidectomy in 2007 with post-op  hypocalcemia and post-op hypothyroidism/hypoparathyroidism with hypocalcemia   Neck pain on right side 07/13/2013   Nocturia 05/31/2013   Osteoarthritis    Pain in right axilla 03/13/2016   Palpitations    a. 03/2012 - patient set up for event monitor but did not wear correctly -  she declined wearing a repeat monitor   Post-surgical hypothyroidism    Sun-damaged skin 12/05/2014   Tubular adenoma of colon 06/2011   Unspecified constipation 05/31/2013   Vertigo    Zinc deficiency 11/26/2015    Past Surgical History:  Procedure Laterality Date   CARDIAC CATHETERIZATION N/A 03/08/2016   Procedure: Left Heart Cath and Coronary Angiography;  Surgeon: JJettie Booze MD;  [facility];  Service: Cardiovascular;  Laterality: N/A;   COLONOSCOPY  Aenomatous polyps   07/05/2011   COLONOSCOPY N/A 09/28/2014   Procedure: COLONOSCOPY;  Surgeon: MLadene Artist MD;  Location: [facility];  Service: Endoscopy;  Laterality: N/A;   CORONARY ANGIOPLASTY WITH STENT PLACEMENT     LEFT HEART CATH AND CORONARY ANGIOGRAPHY N/A 12/09/2018   Procedure: LEFT HEART CATH AND CORONARY ANGIOGRAPHY;  Surgeon: VJettie Booze MD;  [facility];  Service: Cardiovascular;  Laterality: N/A;   LEFT HEART CATHETERIZATION WITH CORONARY ANGIOGRAM N/A 07/17/2012   Procedure: LEFT HEART CATHETERIZATION WITH CORONARY ANGIOGRAM;  Surgeon: Hillary Bow, MD;  Location: [facility] C[facility] LAB;  Service: Cardiovascular;  Laterality: N/A;   PERCUTANEOUS CORONARY STENT INTERVENTION (PCI-S) N/A 11/19/2011   Procedure: PERCUTANEOUS CORONARY STENT INTERVENTION (PCI-S);  Surgeon: Hillary Bow, MD;  Location: [facility] CATH LAB;  Service: Cardiovascular;  Laterality: N/A;   POLYPECTOMY     SHOULDER ARTHROSCOPY W/ ROTATOR CUFF REPAIR     right   TOTAL THYROIDECTOMY  2007   GOITER     Family History  Problem Relation Age of Onset   Colon cancer Brother    Cancer Brother        COLON   Hypertension Father    Heart disease Father    Heart attack Father    Blindness Sister    Congestive Heart Failure Sister    Hypertension Sister    Healthy Daughter    Healthy Son    Thyroid disease Sister    Cancer Brother        multiple myelomas   Healthy Daughter    Healthy Daughter    Diabetes Neg Hx    Prostate cancer Neg Hx    Breast cancer Neg Hx    Esophageal cancer Neg Hx    Rectal cancer Neg Hx    Stomach cancer Neg Hx     Social History   Socioeconomic History   Marital status: Married    Spouse name: Not on file   Number of children: 4   Years of education: 14   Highest education level: Not on file  Occupational History   Occupation: Sales person at [employer]   Smoking status: Never   Smokeless tobacco: Never  Vaping Use   Vaping Use: Never used  Substance and Sexual Activity   Alcohol use: Not Currently   Drug use: No   Sexual activity: Not Currently  Other Topics Concern   Not on file  Social History Narrative   Lives at home alone.  Her son lives near her.   Right-handed.   1 cup coffee per day.   Social Determinants of Health   Financial Resource Strain: Not on file  Food Insecurity: Not on file  Transportation Needs: Not on file  Physical Activity: Not on file  Stress: Not on file  Social Connections: Not on file  Intimate Partner Violence: Not on file    Outpatient Medications Prior to Visit  Medication Sig Dispense Refill   ALPRAZolam (XANAX) 0.25 MG tablet Take 1 tablet (0.25 mg total) by mouth 2 (two) times daily as needed for anxiety. 30 tablet 0   amLODipine (NORVASC) 10 MG tablet Take 1 tablet (10 mg total) by mouth daily. 90 tablet 3   aspirin EC 81 MG tablet Take 81 mg by mouth daily.     B Complex-C (B-COMPLEX WITH VITAMIN C) tablet Take 1 tablet by mouth daily.     bisoprolol (ZEBETA) 5 MG tablet Take 1  tablet (5 mg total) by mouth daily. 90 tablet 3   calcitRIOL (ROCALTROL) 0.25 MCG capsule Take 3 capsules (0.75 mcg total) by mouth daily. 540 capsule 1   Cholecalciferol (VITAMIN D-3) 25 MCG (1000 UT) CAPS Take 3000-4000 units by mouth daily 60 capsule 0   clopidogrel (PLAVIX) 75 MG tablet TAKE 1 TABLET BY MOUTH  DAILY 90 tablet 3   Evolocumab (REPATHA SURECLICK) 517 MG/ML SOAJ  INJECT 140 MG  SUBCUTANEOUSLY EVERY 2  WEEKS 6 mL 3   fluconazole (DIFLUCAN) 150 MG tablet Take 1 tablet (150 mg total) by mouth once a week. 4 tablet 2   furosemide (LASIX) 20 MG tablet TAKE 1 TABLET BY MOUTH ONCE WEEKLY AS NEEDED 15 tablet 3   isosorbide mononitrate (IMDUR) 60 MG 24 hr tablet Take 1 tablet (60 mg total) by mouth daily. 90 tablet 3   ketoconazole (NIZORAL) 2 % shampoo Apply 1 application topically 2 (two) times a week. 120 mL 0   levothyroxine (SYNTHROID) 112 MCG tablet Take 1 tablet (112 mcg total) by mouth daily before breakfast. 90 tablet 1   LOTEMAX SM 0.38 % GEL      Multiple Vitamin (MULTIVITAMIN WITH MINERALS) TABS tablet Take 1 tablet by mouth daily.     neomycin-polymyxin-dexameth (MAXITROL) 0.1 % OINT neomycin 3.5 mg/g-polymyxin B 10,000 unit/g-dexameth 0.1 % eye oint     nitroGLYCERIN (NITROSTAT) 0.4 MG SL tablet Place 1 tablet (0.4 mg total) under the tongue every 5 (five) minutes x 3 doses as needed for chest pain. 25 tablet 3   pantoprazole (PROTONIX) 40 MG tablet TAKE 1 TABLET BY MOUTH  DAILY 90 tablet 3   PROLENSA 0.07 % SOLN      rosuvastatin (CRESTOR) 40 MG tablet TAKE 1 TABLET BY MOUTH  DAILY 90 tablet 3   traMADol (ULTRAM) 50 MG tablet tramadol 50 mg tablet  TK 1 T PO TID PRN     No facility-administered medications prior to visit.    Allergies  Allergen Reactions   Zetia [Ezetimibe] Other (See Comments)    Myalgia, paresthesias and weakness    Review of Systems  Constitutional:  Negative for fever and malaise/fatigue.  HENT:  Negative for congestion.   Eyes:  Negative for  redness.  Respiratory:  Negative for shortness of breath.   Cardiovascular:  Negative for chest pain, palpitations and leg swelling.  Gastrointestinal:  Negative for abdominal pain, blood in stool and nausea.  Genitourinary:  Negative for dysuria and frequency.  Musculoskeletal:  Positive for joint pain (thumbs). Negative for falls.  Skin:  Negative for rash.  Neurological:  Negative for dizziness, loss of consciousness and headaches.  Endo/Heme/Allergies:  Negative for polydipsia.  Psychiatric/Behavioral:  Negative for depression. The patient is not nervous/anxious.       Objective:    Physical Exam Constitutional:      General: She is not in acute distress.    Appearance: Normal appearance. She is well-developed. She is not ill-appearing.  HENT:     Head: Normocephalic and atraumatic.     Right Ear: Tympanic membrane, ear canal and external ear normal.     Left Ear: Tympanic membrane, ear canal and external ear normal.  Eyes:     Conjunctiva/sclera: Conjunctivae normal.  Neck:     Thyroid: No thyromegaly.  Cardiovascular:     Rate and Rhythm: Normal rate and regular rhythm.     Heart sounds: Normal heart sounds. No murmur heard. Pulmonary:     Effort: Pulmonary effort is normal. No respiratory distress.     Breath sounds: Normal breath sounds. No wheezing.  Abdominal:     General: Bowel sounds are normal. There is no distension.     Palpations: Abdomen is soft. There is no mass.     Tenderness: There is no abdominal tenderness.     Hernia: No hernia is present.  Musculoskeletal:     Cervical back: Neck supple.  Lymphadenopathy:  Cervical: No cervical adenopathy.  Skin:    General: Skin is warm and dry.     Comments: lesion under right eye   Neurological:     Mental Status: She is alert and oriented to person, place, and time.  Psychiatric:        Behavior: Behavior normal.    BP 122/62    Pulse 78    Temp 97.6 F (36.4 C)    Resp 16    Ht _0  (1.626 m)    Wt  195 lb 12.8 oz (88.8 kg)    SpO2 96%    BMI 33.61 kg/m  Wt Readings from Last 3 Encounters:  12/11/21 195 lb 12.8 oz (88.8 kg)  10/23/21 198 lb 3.2 oz (89.9 kg)  09/13/21 196 lb (88.9 kg)    Diabetic Foot Exam - Simple   No data filed    Lab Results  Component Value Date   WBC 10.7 (H) 10/23/2021   HGB 13.3 10/23/2021   HCT 40.2 10/23/2021   PLT 301.0 10/23/2021   GLUCOSE 76 10/23/2021   CHOL 175 09/13/2021   TRIG 246 (H) 09/13/2021   HDL 42 09/13/2021   LDLDIRECT 84.0 12/28/2020   LDLCALC 92 09/13/2021   ALT 37 (H) 10/23/2021   AST 30 10/23/2021   NA 141 10/23/2021   K 4.2 10/23/2021   CL 103 10/23/2021   CREATININE 1.02 10/23/2021   BUN 26 (H) 10/23/2021   CO2 29 10/23/2021   TSH 1.18 10/23/2021   INR 0.86 03/07/2016   HGBA1C 5.7 10/23/2021    Lab Results  Component Value Date   TSH 1.18 10/23/2021   Lab Results  Component Value Date   WBC 10.7 (H) 10/23/2021   HGB 13.3 10/23/2021   HCT 40.2 10/23/2021   MCV 87.6 10/23/2021   PLT 301.0 10/23/2021   Lab Results  Component Value Date   NA 141 10/23/2021   K 4.2 10/23/2021   CO2 29 10/23/2021   GLUCOSE 76 10/23/2021   BUN 26 (H) 10/23/2021   CREATININE 1.02 10/23/2021   BILITOT 0.4 10/23/2021   ALKPHOS 73 10/23/2021   AST 30 10/23/2021   ALT 37 (H) 10/23/2021   PROT 7.1 10/23/2021   ALBUMIN 4.1 10/23/2021   CALCIUM 8.4 10/23/2021   ANIONGAP 7 02/25/2019   EGFR 51 (L) 09/13/2021   GFR 54.13 (L) 10/23/2021   Lab Results  Component Value Date   CHOL 175 09/13/2021   Lab Results  Component Value Date   HDL 42 09/13/2021   Lab Results  Component Value Date   LDLCALC 92 09/13/2021   Lab Results  Component Value Date   TRIG 246 (H) 09/13/2021   Lab Results  Component Value Date   CHOLHDL 4.2 09/13/2021   Lab Results  Component Value Date   HGBA1C 5.7 10/23/2021       Assessment & Plan:   Problem List Items Addressed This Visit     Hypothyroidism   Hyperlipidemia    Encourage  heart healthy diet such as MIND or DASH diet, increase exercise, avoid trans fats, simple carbohydrates and processed foods, consider a krill or fish or flaxseed oil cap daily.       Relevant Orders   Lipid panel   Essential hypertension, benign    Well controlled, no changes to meds. Encouraged heart healthy diet such as the DASH diet and exercise as tolerated.       Relevant Orders   CBC   Comprehensive metabolic  panel   TSH   Acute MI, subendocardial (HCC)   Anemia   Relevant Orders   CBC   Bilateral thumb pain    Encouraged moist heat and gentle stretching as tolerated. May try NSAIDs and prescription meds as directed and report if symptoms worsen or seek immediate care. Check ANA, RF, uric acid sed rate but likely OA      Relevant Orders   Rheumatoid Factor   Sedimentation rate   Uric acid   Zinc deficiency    Supplement and monitor      Skin lesion of face - Primary    Lesion under right eye. Developed about a month ago. Referred to Derm for evaluate.       Relevant Orders   Ambulatory referral to Dermatology   Peripheral arterial disease ([place])   Anxiety    Very stressed with her husband's poor health. It has been recommended he go to Hospice but they have refused so far      ANA positive   Relevant Orders   Antinuclear Antib (ANA)   Heart disease   Relevant Orders   Comprehensive metabolic panel     No orders of the defined types were placed in this encounter.   I,[name],acting as a Education administrator for Penni Homans, MD.,have documented all relevant documentation on the behalf of Penni Homans, MD,as directed by  Penni Homans, MD while in the presence of Penni Homans, MD.   I, Mosie Lukes, MD. , personally preformed the services described in this documentation.  All medical record entries made by the scribe were at my direction and in my presence.  I have reviewed the chart and discharge instructions (if applicable) and agree that the record reflects my  personal performance and is accurate and complete. 12/11/2021

## 2021-12-13 ENCOUNTER — Other Ambulatory Visit: Payer: Self-pay | Admitting: Internal Medicine

## 2021-12-13 DIAGNOSIS — R079 Chest pain, unspecified: Secondary | ICD-10-CM

## 2021-12-13 NOTE — Telephone Encounter (Signed)
Done

## 2021-12-19 ENCOUNTER — Telehealth: Payer: Self-pay

## 2021-12-19 DIAGNOSIS — M5416 Radiculopathy, lumbar region: Secondary | ICD-10-CM | POA: Diagnosis not present

## 2021-12-19 DIAGNOSIS — M5412 Radiculopathy, cervical region: Secondary | ICD-10-CM | POA: Diagnosis not present

## 2021-12-19 NOTE — Telephone Encounter (Signed)
/  Patient Name: [name] DOB: 05-02-1946/ MRN: [id number]  Primary Cardiologist: Dr. Harrington Challenger  Chart reviewed as part of pre-operative protocol coverage. Surgeon's office requesting pharmacy clearance for upcoming lumbar ESI, scheduled for 01/09/22.   75 y.o. female with CAD (s/p NSTEMI, DESx2 10/2011, thyroidectomy, GERD, HTN, HLD, and vertigo. Repeat cath in Dec 2019 showed LAD with 25%, OM  with 100% with Left to R collaterals, RCA with patent stent, normal LVEF. Echo in early Sept 2020 with normal LVEF and RVEF, mild diastolic dysfunction. Last seen 09/13/2021 and was doing well from cardiac standpoint.   Dr. Harrington Challenger, please advise on holding Plavix for 7 days prior to surgery. Please route your response to [facility] PREOP.   Thank you,  Lenna Sciara, NP

## 2021-12-19 NOTE — Telephone Encounter (Signed)
° °  Pre-operative Risk Assessment    Patient Name: [name]  DOB: 1946-05-13 MRN: [id number]{    Request for Surgical Clearance{  Procedure:   Lumbar ESI  Date of Surgery:  Clearance 01/09/22                               {  Surgeon:  Not listed  Surgeon's Group or Practice Name:  [facility] Phone number:  [phone]. [phone] Fax number:  [phone]  Type of Clearance Requested:   - Pharmacy:  Hold Clopidogrel (Plavix) 7 days prior    Type of Anesthesia:  Not Indicated    Signed, Mendel Ryder   12/19/2021, 10:51 AM

## 2022-01-01 ENCOUNTER — Other Ambulatory Visit: Payer: Commercial Managed Care - HMO | Admitting: *Deleted

## 2022-01-01 ENCOUNTER — Other Ambulatory Visit: Payer: Self-pay

## 2022-01-01 DIAGNOSIS — E785 Hyperlipidemia, unspecified: Secondary | ICD-10-CM | POA: Diagnosis not present

## 2022-01-01 DIAGNOSIS — Z79899 Other long term (current) drug therapy: Secondary | ICD-10-CM

## 2022-01-01 LAB — LIPID PANEL
Chol/HDL Ratio: 4.2 ratio (ref 0.0–4.4)
Cholesterol, Total: 195 mg/dL (ref 100–199)
HDL: 46 mg/dL (ref >39)
LDL Chol Calc (NIH): 109 mg/dL — ABNORMAL HIGH (ref 0–99)
Triglycerides: 234 mg/dL — ABNORMAL HIGH (ref 0–149)
VLDL Cholesterol Cal: 40 mg/dL (ref 5–40)

## 2022-01-02 ENCOUNTER — Other Ambulatory Visit: Payer: Self-pay | Admitting: Internal Medicine

## 2022-01-02 DIAGNOSIS — E876 Hypokalemia: Secondary | ICD-10-CM

## 2022-01-02 DIAGNOSIS — I10 Essential (primary) hypertension: Secondary | ICD-10-CM

## 2022-01-02 DIAGNOSIS — E039 Hypothyroidism, unspecified: Secondary | ICD-10-CM

## 2022-01-04 DIAGNOSIS — I83892 Varicose veins of left lower extremities with other complications: Secondary | ICD-10-CM | POA: Diagnosis not present

## 2022-01-04 NOTE — Telephone Encounter (Signed)
Left message for the patient to call back and speak to the on-call preop APP of the day 

## 2022-01-05 ENCOUNTER — Telehealth: Payer: Self-pay

## 2022-01-05 NOTE — Telephone Encounter (Signed)
-----   Message from Fay Records, MD sent at 01/01/2022  9:28 PM EST ----- LDL is 109   Please confirm what meds she is on for cholesterol

## 2022-01-05 NOTE — Telephone Encounter (Signed)
Pt advised her lab results and she is taking Crestor 40 mg daily and Repatha q15 days... she says that over the holidays she was not watching her diet but has since been much moire cautious about what she eats.

## 2022-01-09 NOTE — Telephone Encounter (Signed)
Contacted patient as part of preoperative protocol.  Patient reported that she had spinal injection.  She tolerated the procedure well.  She will contact [facility] office to schedule follow-up cardiology appointment.  No further requests at this time.  Jossie Ng. Versa Craton NP-C    01/09/2022, 9:24 AM [address] Office [phone] Fax [phone]

## 2022-01-18 ENCOUNTER — Telehealth: Payer: Self-pay | Admitting: *Deleted

## 2022-01-18 DIAGNOSIS — M7989 Other specified soft tissue disorders: Secondary | ICD-10-CM | POA: Diagnosis not present

## 2022-01-18 DIAGNOSIS — I83891 Varicose veins of right lower extremities with other complications: Secondary | ICD-10-CM | POA: Diagnosis not present

## 2022-01-18 DIAGNOSIS — I83811 Varicose veins of right lower extremities with pain: Secondary | ICD-10-CM | POA: Diagnosis not present

## 2022-01-18 NOTE — Telephone Encounter (Signed)
Pt has lab appointment on 01/23/22. Lipid panel is one of the future orders that is due.  It looks like pt had a lipid panel done by cardiology on 01/01/22.  Should I cancel our future order for lipid panel at this time and do all other future orders listed?

## 2022-01-19 NOTE — Telephone Encounter (Signed)
Lipid panel cancelled. Thank you!

## 2022-01-23 ENCOUNTER — Other Ambulatory Visit (INDEPENDENT_AMBULATORY_CARE_PROVIDER_SITE_OTHER): Payer: Medicare Other

## 2022-01-23 ENCOUNTER — Other Ambulatory Visit: Payer: Medicare Other

## 2022-01-23 ENCOUNTER — Other Ambulatory Visit: Payer: Self-pay | Admitting: Internal Medicine

## 2022-01-23 DIAGNOSIS — I10 Essential (primary) hypertension: Secondary | ICD-10-CM

## 2022-01-23 DIAGNOSIS — E60 Dietary zinc deficiency: Secondary | ICD-10-CM | POA: Diagnosis not present

## 2022-01-23 DIAGNOSIS — I519 Heart disease, unspecified: Secondary | ICD-10-CM

## 2022-01-23 DIAGNOSIS — D500 Iron deficiency anemia secondary to blood loss (chronic): Secondary | ICD-10-CM | POA: Diagnosis not present

## 2022-01-23 DIAGNOSIS — M79645 Pain in left finger(s): Secondary | ICD-10-CM | POA: Diagnosis not present

## 2022-01-23 DIAGNOSIS — M79644 Pain in right finger(s): Secondary | ICD-10-CM | POA: Diagnosis not present

## 2022-01-23 DIAGNOSIS — R768 Other specified abnormal immunological findings in serum: Secondary | ICD-10-CM

## 2022-01-23 LAB — COMPREHENSIVE METABOLIC PANEL
ALT: 13 U/L (ref 0–35)
AST: 15 U/L (ref 0–37)
Albumin: 4.1 g/dL (ref 3.5–5.2)
Alkaline Phosphatase: 80 U/L (ref 39–117)
BUN: 27 mg/dL — ABNORMAL HIGH (ref 6–23)
CO2: 32 mEq/L (ref 19–32)
Calcium: 8.6 mg/dL (ref 8.4–10.5)
Chloride: 102 mEq/L (ref 96–112)
Creatinine, Ser: 1.16 mg/dL (ref 0.40–1.20)
GFR: 46.31 mL/min — ABNORMAL LOW (ref >60.00)
Glucose, Bld: 92 mg/dL (ref 70–99)
Potassium: 4.3 mEq/L (ref 3.5–5.1)
Sodium: 143 mEq/L (ref 135–145)
Total Bilirubin: 0.5 mg/dL (ref 0.2–1.2)
Total Protein: 7 g/dL (ref 6.0–8.3)

## 2022-01-23 LAB — CBC
HCT: 42 % (ref 36.0–46.0)
Hemoglobin: 13.7 g/dL (ref 12.0–15.0)
MCHC: 32.7 g/dL (ref 30.0–36.0)
MCV: 88.2 fl (ref 78.0–100.0)
Platelets: 247 10*3/uL (ref 150.0–400.0)
RBC: 4.76 Mil/uL (ref 3.87–5.11)
RDW: 14.7 % (ref 11.5–15.5)
WBC: 7.7 10*3/uL (ref 4.0–10.5)

## 2022-01-23 LAB — SEDIMENTATION RATE: Sed Rate: 22 mm/hr (ref 0–30)

## 2022-01-23 LAB — URIC ACID: Uric Acid, Serum: 6.9 mg/dL (ref 2.4–7.0)

## 2022-01-24 ENCOUNTER — Other Ambulatory Visit: Payer: Medicare Other

## 2022-01-24 ENCOUNTER — Other Ambulatory Visit: Payer: Self-pay

## 2022-01-24 DIAGNOSIS — R799 Abnormal finding of blood chemistry, unspecified: Secondary | ICD-10-CM

## 2022-01-24 LAB — TSH: TSH: 2.67 u[IU]/mL (ref 0.35–5.50)

## 2022-01-25 LAB — ZINC: Zinc: 83 ug/dL (ref 60–130)

## 2022-01-26 LAB — RHEUMATOID FACTOR: Rheumatoid fact SerPl-aCnc: <14 IU/mL (ref <14)

## 2022-01-26 LAB — ANTI-NUCLEAR AB-TITER (ANA TITER): ANA Titer 1: 1:80 {titer} — ABNORMAL HIGH

## 2022-01-26 LAB — ANA: Anti Nuclear Antibody (ANA): POSITIVE — AB

## 2022-02-12 ENCOUNTER — Other Ambulatory Visit (INDEPENDENT_AMBULATORY_CARE_PROVIDER_SITE_OTHER): Payer: Medicare Other

## 2022-02-12 DIAGNOSIS — R799 Abnormal finding of blood chemistry, unspecified: Secondary | ICD-10-CM

## 2022-02-12 LAB — COMPREHENSIVE METABOLIC PANEL
ALT: 11 U/L (ref 0–35)
AST: 15 U/L (ref 0–37)
Albumin: 4.1 g/dL (ref 3.5–5.2)
Alkaline Phosphatase: 73 U/L (ref 39–117)
BUN: 19 mg/dL (ref 6–23)
CO2: 33 mEq/L — ABNORMAL HIGH (ref 19–32)
Calcium: 8.6 mg/dL (ref 8.4–10.5)
Chloride: 103 mEq/L (ref 96–112)
Creatinine, Ser: 1.09 mg/dL (ref 0.40–1.20)
GFR: 49.88 mL/min — ABNORMAL LOW (ref >60.00)
Glucose, Bld: 101 mg/dL — ABNORMAL HIGH (ref 70–99)
Potassium: 4 mEq/L (ref 3.5–5.1)
Sodium: 141 mEq/L (ref 135–145)
Total Bilirubin: 0.5 mg/dL (ref 0.2–1.2)
Total Protein: 6.8 g/dL (ref 6.0–8.3)

## 2022-02-15 DIAGNOSIS — L720 Epidermal cyst: Secondary | ICD-10-CM | POA: Diagnosis not present

## 2022-02-15 DIAGNOSIS — L821 Other seborrheic keratosis: Secondary | ICD-10-CM | POA: Diagnosis not present

## 2022-02-15 DIAGNOSIS — D485 Neoplasm of uncertain behavior of skin: Secondary | ICD-10-CM | POA: Diagnosis not present

## 2022-02-15 DIAGNOSIS — L820 Inflamed seborrheic keratosis: Secondary | ICD-10-CM | POA: Diagnosis not present

## 2022-02-15 DIAGNOSIS — L738 Other specified follicular disorders: Secondary | ICD-10-CM | POA: Diagnosis not present

## 2022-02-21 ENCOUNTER — Ambulatory Visit: Payer: Medicare Other | Admitting: Family Medicine

## 2022-02-21 ENCOUNTER — Ambulatory Visit (INDEPENDENT_AMBULATORY_CARE_PROVIDER_SITE_OTHER): Payer: Medicare Other | Admitting: Family Medicine

## 2022-02-21 ENCOUNTER — Encounter: Payer: Self-pay | Admitting: Family Medicine

## 2022-02-21 VITALS — BP 124/52 | HR 52 | Ht 64.0 in | Wt 193.4 lb

## 2022-02-21 DIAGNOSIS — I10 Essential (primary) hypertension: Secondary | ICD-10-CM | POA: Diagnosis not present

## 2022-02-21 DIAGNOSIS — E782 Mixed hyperlipidemia: Secondary | ICD-10-CM | POA: Diagnosis not present

## 2022-02-21 DIAGNOSIS — R079 Chest pain, unspecified: Secondary | ICD-10-CM

## 2022-02-21 DIAGNOSIS — I2510 Atherosclerotic heart disease of native coronary artery without angina pectoris: Secondary | ICD-10-CM

## 2022-02-21 DIAGNOSIS — E890 Postprocedural hypothyroidism: Secondary | ICD-10-CM | POA: Diagnosis not present

## 2022-02-21 DIAGNOSIS — K2100 Gastro-esophageal reflux disease with esophagitis, without bleeding: Secondary | ICD-10-CM

## 2022-02-21 MED ORDER — CLOPIDOGREL BISULFATE 75 MG PO TABS: 75.0000 mg | ORAL_TABLET | Freq: Every day | ORAL | 3 refills | Status: DC

## 2022-02-21 MED ORDER — LEVOTHYROXINE SODIUM 112 MCG PO TABS: 112.0000 ug | ORAL_TABLET | Freq: Every day | ORAL | 1 refills | Status: DC

## 2022-02-21 MED ORDER — NITROGLYCERIN 0.4 MG SL SUBL: SUBLINGUAL_TABLET | SUBLINGUAL | 2 refills | Status: DC

## 2022-02-21 NOTE — Assessment & Plan Note (Signed)
Blood pressure is at goal for age and co-morbidities.  I recommend continue amlodipine, bisoprolol.  In addition they were instructed on the following: ?- BP goal <130/80 ?- monitor and log blood pressures at home ?- check around the same time each day in a relaxed setting ?- Limit salt to <2000 mg/day ?- Follow DASH eating plan (heart healthy diet) ?- limit alcohol to 2 standard drinks per day for men and 1 per day for women ?- avoid tobacco products ?- get at least 2 hours of regular aerobic exercise weekly ?Patient aware of signs/symptoms requiring further/urgent evaluation. ?Labs updated today. ? ?

## 2022-02-21 NOTE — Progress Notes (Signed)
Established Patient Office Visit  Subjective:  Patient ID: [name], adult    DOB: [birth year]  Age: 76 y.o. MRN: [id number]  CC:  routine f/u    HPI [name] presents for routine f/u   She thinks she had the Flu last week (-COVID at home) reported rhinorrhea, cough, no fever; 2 days ago had upper abdominal pain throughout and nausea, happened several times last month, none in the past few weeks - history of GERD, currently on protonix 40 mg daily. Denies any chest pain, dyspnea, constipation, diarrhea, blood in stool or urine, other abdominal/back pain.   HYPERTENSION: - Medications: bisoprolol 5 mg daily, lasix 20 mg daily, imdur 60 mg daily  - Compliance: good - Checking BP at home: SBP <150 - Denies any SOB, recurrent headaches, CP, vision changes, LE edema, dizziness, palpitations, or medication side effects. - Diet: vegetarian  - Exercise: minimal,  - Stressors: husband at hospice home    HYPOTHYROIDISM Doing well. No symptoms. Currently on levothyroxine 112 mcg daily.      Past Medical History:  Diagnosis Date   Acute MI, subendocardial ([place])    Arthritis    Back pain 05/31/2013   Benign paroxysmal positional vertigo 08/05/2016   CAD (coronary artery disease)    a. NSTEMI 10/2011: [place] 11/19/11: pLAD 30%, oOM 60%, AVCFX 30%, CFX after OM2 30%, pRCA 60 and 70%, then 99%, AM 80-90% with TIMI 3 flow.  PCI: Promus DES x 2 to RCA; b. 06/2012 Cath: patent RCA stents w/ subtl occl of Acute Marginal (jailed)->Med rx; c. 05/2015 MV: EF 59%, mod mid infsept/inf/ap lat/ap infarct with peri-infarct isch-->Med Rx; d. 02/2016 Cath: LM nl, LAD 33m RI 50, RCA patent stents.   Colitis    Facial skin lesion 01/28/2017   GERD (gastroesophageal reflux disease)    occasional   Hyperlipidemia    Hypertension    Hypertensive heart disease    a. Echocardiogram 11/19/11: Difficult acoustic windows, EF 60-65%, normal LV wall thickness, grade 1 diastolic dysfunction    Hypoparathyroidism (HCC)    Low zinc level 11/26/2016   Multinodular thyroid    Goiter s/p thyroidectomy in 2007 with post-op  hypocalcemia and post-op hypothyroidism/hypoparathyroidism with hypocalcemia   Neck pain on right side 07/13/2013   Nocturia 05/31/2013   Osteoarthritis    Pain in right axilla 03/13/2016   Palpitations    a. 03/2012 - patient set up for event monitor but did not wear correctly -  she declined wearing a repeat monitor   Post-surgical hypothyroidism    Sun-damaged skin 12/05/2014   Tubular adenoma of colon 06/2011   Unspecified constipation 05/31/2013   Vertigo    Zinc deficiency 11/26/2015    Past Surgical History:  Procedure Laterality Date   CARDIAC CATHETERIZATION N/A 03/08/2016   Procedure: Left Heart Cath and Coronary Angiography;  Surgeon: JJettie Booze MD;  [facility];  Service: Cardiovascular;  Laterality: N/A;   COLONOSCOPY  Aenomatous polyps   07/05/2011   COLONOSCOPY N/A 09/28/2014   Procedure: COLONOSCOPY;  Surgeon: MLadene Artist MD;  Location: [facility];  Service: Endoscopy;  Laterality: N/A;   CORONARY ANGIOPLASTY WITH STENT PLACEMENT     LEFT HEART CATH AND CORONARY ANGIOGRAPHY N/A 12/09/2018   Procedure: LEFT HEART CATH AND CORONARY ANGIOGRAPHY;  Surgeon: VJettie Booze MD;  [facility];  Service: Cardiovascular;  Laterality: N/A;   LEFT HEART CATHETERIZATION WITH CORONARY ANGIOGRAM N/A 07/17/2012   Procedure: LEFT  HEART CATHETERIZATION WITH CORONARY ANGIOGRAM;  Surgeon: Hillary Bow, MD;  Location: [facility];  Service: Cardiovascular;  Laterality: N/A;   PERCUTANEOUS CORONARY STENT INTERVENTION (PCI-S) N/A 11/19/2011   Procedure: PERCUTANEOUS CORONARY STENT INTERVENTION (PCI-S);  Surgeon: Hillary Bow, MD;  Location: [facility] CATH LAB;  Service: Cardiovascular;  Laterality: N/A;   POLYPECTOMY     SHOULDER ARTHROSCOPY W/ ROTATOR CUFF REPAIR     right   TOTAL THYROIDECTOMY  2007   GOITER     Family History  Problem Relation Age of Onset   Colon cancer Brother    Cancer Brother        COLON   Hypertension Father    Heart disease Father    Heart attack Father    Blindness Sister    Congestive Heart Failure Sister    Hypertension Sister    Healthy Daughter    Healthy Son    Thyroid disease Sister    Cancer Brother        multiple myelomas   Healthy Daughter    Healthy Daughter    Diabetes Neg Hx    Prostate cancer Neg Hx    Breast cancer Neg Hx    Esophageal cancer Neg Hx    Rectal cancer Neg Hx    Stomach cancer Neg Hx     Social History   Socioeconomic History   Marital status: Married    Spouse name: Not on file   Number of children: 4   Years of education: 14   Highest education level: Not on file  Occupational History   Occupation: Sales person at [employer]   Smoking status: Never   Smokeless tobacco: Never  Vaping Use   Vaping Use: Never used  Substance and Sexual Activity   Alcohol use: Not Currently   Drug use: No   Sexual activity: Not Currently  Other Topics Concern   Not on file  Social History Narrative   Lives at home alone.  Her son lives near her.   Right-handed.   1 cup coffee per day.   Social Determinants of Health   Financial Resource Strain: Not on file  Food Insecurity: Not on file  Transportation Needs: Not on file  Physical Activity: Not on file  Stress: Not on file  Social Connections: Not on file  Intimate Partner Violence: Not on file    Outpatient Medications Prior to Visit  Medication Sig Dispense Refill   ALPRAZolam (XANAX) 0.25 MG tablet Take 1 tablet (0.25 mg total) by mouth 2 (two) times daily as needed for anxiety. 30 tablet 0   amLODipine (NORVASC) 10 MG tablet TAKE 1 TABLET BY MOUTH  DAILY 90 tablet 2   aspirin EC 81 MG tablet Take 81 mg by mouth daily.     B Complex-C (B-COMPLEX WITH VITAMIN C) tablet Take 1 tablet by mouth daily.     bisoprolol (ZEBETA) 5 MG tablet TAKE 1 TABLET BY MOUTH   DAILY 90 tablet 2   calcitRIOL (ROCALTROL) 0.25 MCG capsule Take 3 capsules (0.75 mcg total) by mouth daily. 540 capsule 1   Cholecalciferol (VITAMIN D-3) 25 MCG (1000 UT) CAPS Take 3000-4000 units by mouth daily 60 capsule 0   Evolocumab (REPATHA SURECLICK) 888 MG/ML SOAJ INJECT 140 MG  SUBCUTANEOUSLY EVERY 2  WEEKS 6 mL 3   fluconazole (DIFLUCAN) 150 MG tablet Take 1 tablet (150 mg total) by mouth once a week. 4 tablet 2   furosemide (LASIX) 20 MG tablet  TAKE 1 TABLET BY MOUTH ONCE WEEKLY AS NEEDED 15 tablet 2   isosorbide mononitrate (IMDUR) 60 MG 24 hr tablet TAKE 1 TABLET BY MOUTH  DAILY 90 tablet 2   LOTEMAX SM 0.38 % GEL      Multiple Vitamin (MULTIVITAMIN WITH MINERALS) TABS tablet Take 1 tablet by mouth daily.     neomycin-polymyxin-dexameth (MAXITROL) 0.1 % OINT neomycin 3.5 mg/g-polymyxin B 10,000 unit/g-dexameth 0.1 % eye oint     pantoprazole (PROTONIX) 40 MG tablet TAKE 1 TABLET BY MOUTH  DAILY 90 tablet 3   PROLENSA 0.07 % SOLN      rosuvastatin (CRESTOR) 40 MG tablet TAKE 1 TABLET BY MOUTH  DAILY 90 tablet 2   clopidogrel (PLAVIX) 75 MG tablet TAKE 1 TABLET BY MOUTH  DAILY 90 tablet 3   ketoconazole (NIZORAL) 2 % shampoo Apply 1 application topically 2 (two) times a week. 120 mL 0   levothyroxine (SYNTHROID) 112 MCG tablet Take 1 tablet (112 mcg total) by mouth daily before breakfast. 90 tablet 1   nitroGLYCERIN (NITROSTAT) 0.4 MG SL tablet DISSOLVE 1 TABLET UNDER THE TONGUE EVERY 5 MINUTES AS  NEEDED FOR CHEST PAIN. MAX  OF 3 TABLETS IN 15 MINUTES. CALL 911 IF PAIN PERSISTS. 75 tablet 2   traMADol (ULTRAM) 50 MG tablet tramadol 50 mg tablet  TK 1 T PO TID PRN     No facility-administered medications prior to visit.    Allergies  Allergen Reactions   Zetia [Ezetimibe] Other (See Comments)    Myalgia, paresthesias and weakness    ROS Review of Systems All review of systems negative except what is listed in the HPI    Objective:    Physical Exam Vitals reviewed.   Constitutional:      Appearance: Normal appearance.  HENT:     Head: Normocephalic and atraumatic.  Cardiovascular:     Rate and Rhythm: Normal rate and regular rhythm.  Pulmonary:     Effort: Pulmonary effort is normal.     Breath sounds: Normal breath sounds.  Abdominal:     General: Abdomen is flat.     Palpations: Abdomen is soft.     Tenderness: There is no abdominal tenderness.  Musculoskeletal:     Cervical back: Normal range of motion and neck supple. No tenderness.  Lymphadenopathy:     Cervical: No cervical adenopathy.  Skin:    General: Skin is warm and dry.  Neurological:     General: No focal deficit present.     Mental Status: She is alert and oriented to person, place, and time. Mental status is at baseline.  Psychiatric:        Mood and Affect: Mood normal.        Behavior: Behavior normal.        Thought Content: Thought content normal.        Judgment: Judgment normal.    BP (!) 124/52    Pulse (!) 52    Ht $R'5\' 4"'za$  (1.626 m)    Wt 193 lb 6.4 oz (87.7 kg)    BMI 33.20 kg/m  Wt Readings from Last 3 Encounters:  02/21/22 193 lb 6.4 oz (87.7 kg)  12/11/21 195 lb 12.8 oz (88.8 kg)  10/23/21 198 lb 3.2 oz (89.9 kg)     Health Maintenance Due  Topic Date Due   URINE MICROALBUMIN  Never done   Zoster Vaccines- Shingrix (1 of 2) Never done   COVID-19 Vaccine (4 - Booster for [employer] series)  02/01/2021   PAP SMEAR-Modifier  12/28/2021    There are no preventive care reminders to display for this patient.  Lab Results  Component Value Date   TSH 2.67 01/23/2022   Lab Results  Component Value Date   WBC 7.7 01/23/2022   HGB 13.7 01/23/2022   HCT 42.0 01/23/2022   MCV 88.2 01/23/2022   PLT 247.0 01/23/2022   Lab Results  Component Value Date   NA 141 02/12/2022   K 4.0 02/12/2022   CO2 33 (H) 02/12/2022   GLUCOSE 101 (H) 02/12/2022   BUN 19 02/12/2022   CREATININE 1.09 02/12/2022   BILITOT 0.5 02/12/2022   ALKPHOS 73 02/12/2022   AST 15  02/12/2022   ALT 11 02/12/2022   PROT 6.8 02/12/2022   ALBUMIN 4.1 02/12/2022   CALCIUM 8.6 02/12/2022   ANIONGAP 7 02/25/2019   EGFR 51 (L) 09/13/2021   GFR 49.88 (L) 02/12/2022   Lab Results  Component Value Date   CHOL 195 01/01/2022   Lab Results  Component Value Date   HDL 46 01/01/2022   Lab Results  Component Value Date   LDLCALC 109 (H) 01/01/2022   Lab Results  Component Value Date   TRIG 234 (H) 01/01/2022   Lab Results  Component Value Date   CHOLHDL 4.2 01/01/2022   Lab Results  Component Value Date   HGBA1C 5.7 10/23/2021      Assessment & Plan:   Essential hypertension, benign Blood pressure is at goal for age and co-morbidities.  I recommend continue amlodipine, bisoprolol.  In addition they were instructed on the following: - BP goal <130/80 - monitor and log blood pressures at home - check around the same time each day in a relaxed setting - Limit salt to <2000 mg/day - Follow DASH eating plan (heart healthy diet) - limit alcohol to 2 standard drinks per day for men and 1 per day for women - avoid tobacco products - get at least 2 hours of regular aerobic exercise weekly Patient aware of signs/symptoms requiring further/urgent evaluation. Labs updated today.   CAD, NATIVE VESSEL Asymptomatic. No changes.   GERD (gastroesophageal reflux disease) Continue pantoprazole and lifestyle factors. Consider evening Pepcid if needed. Patient aware of signs/symptoms requiring further/urgent evaluation.   Hypothyroidism Labs stable. No changes   Hyperlipidemia HLD PLAN: -Reviewed most recent lipid panel -Medication management: continue Crestor 40 mg  -Diet low in saturated fat -Regular exercise - at least 30 minutes, 5 times per week    Follow-up: Return for as scheduled .    Terrilyn Saver, NP

## 2022-02-21 NOTE — Assessment & Plan Note (Signed)
Continue pantoprazole and lifestyle factors. Consider evening Pepcid if needed. Patient aware of signs/symptoms requiring further/urgent evaluation. ? ?

## 2022-02-21 NOTE — Assessment & Plan Note (Signed)
Labs stable. No changes

## 2022-02-21 NOTE — Assessment & Plan Note (Signed)
Asymptomatic. No changes 

## 2022-02-21 NOTE — Patient Instructions (Addendum)
For occasional upper abdominal/epigastric discomfort - start by adding occassional Pepcid in the evening. Try to avoid heavy meals late at night and fatty, heavy, greasy, spicy foods. Keep a diary of symptoms and let us know if becoming more frequent.  ? ?Continue all of your medications as prescribed. Labs stable. No changes today.  ?

## 2022-02-21 NOTE — Assessment & Plan Note (Signed)
HLD PLAN: ?-Reviewed most recent lipid panel ?-Medication management: continue Crestor 40 mg  ?-Diet low in saturated fat ?-Regular exercise - at least 30 minutes, 5 times per week ? ?

## 2022-02-26 DIAGNOSIS — I83891 Varicose veins of right lower extremities with other complications: Secondary | ICD-10-CM | POA: Diagnosis not present

## 2022-02-26 DIAGNOSIS — I83811 Varicose veins of right lower extremities with pain: Secondary | ICD-10-CM | POA: Diagnosis not present

## 2022-02-28 NOTE — Progress Notes (Unsigned)
Cardiology Office Note    Date:  02/28/2022   ID:  [name], DOB 1946-11-21, MRN [id number]   PCP:  [name], [name]  Cardiologist:  Taylor Carnes, MD *** Advanced Practice Provider:  No care team member to display Electrophysiologist:  None   [phone]   No chief complaint on file.   History of Present Illness:  [name] is a 76 y.o. adult   with CAD - s/p NSTEMI 10/2011 with DESx2   Other issues include thyroidectomy, GERD, HTN, HLD, vertigo.  Last myovue showed inferolateral scar with minimal periinfarct ischemia  The pt was seen in ED for CP in December 2019   L heart cath in Dec 2019 showed:  LAD with 25%  OM  with 100% with Left to R  collaterals  RCA with patent stent   Normal LVEF    Echo in early Sept 2020   LVEF and RVEF were normal   Mild diastolic dysfunction    Patient saw [name] 08/2021 and told her while in [place] she had leg swelling and told she had a blood clot-no blood thinners given and stayed at bed rest. F/u US neg DVT   Past Medical History:  Diagnosis Date   Acute MI, subendocardial ([place])    Arthritis    Back pain 05/31/2013   Benign paroxysmal positional vertigo 08/05/2016   CAD (coronary artery disease)    a. NSTEMI 10/2011: [name] 11/19/11: pLAD 30%, oOM 60%, AVCFX 30%, CFX after OM2 30%, pRCA 60 and 70%, then 99%, AM 80-90% with TIMI 3 flow.  PCI: Promus DES x 2 to RCA; b. 06/2012 Cath: patent RCA stents w/ subtl occl of Acute Marginal (jailed)->Med rx; c. 05/2015 MV: EF 59%, mod mid infsept/inf/ap lat/ap infarct with peri-infarct isch-->Med Rx; d. 02/2016 Cath: LM nl, LAD 57m RI 50, RCA patent stents.   Colitis    Facial skin lesion 01/28/2017   GERD (gastroesophageal reflux disease)    occasional   Hyperlipidemia    Hypertension    Hypertensive heart disease    a. Echocardiogram 11/19/11: Difficult acoustic windows, EF 60-65%, normal LV wall thickness, grade 1 diastolic dysfunction    Hypoparathyroidism (HCC)    Low zinc level 11/26/2016   Multinodular thyroid    Goiter s/p thyroidectomy in 2007 with post-op  hypocalcemia and post-op hypothyroidism/hypoparathyroidism with hypocalcemia   Neck pain on right side 07/13/2013   Nocturia 05/31/2013   Osteoarthritis    Pain in right axilla 03/13/2016   Palpitations    a. 03/2012 - patient set up for event monitor but did not wear correctly -  she declined wearing a repeat monitor   Post-surgical hypothyroidism    Sun-damaged skin 12/05/2014   Tubular adenoma of colon 06/2011   Unspecified constipation 05/31/2013   Vertigo    Zinc deficiency 11/26/2015    Past Surgical History:  Procedure Laterality Date   CARDIAC CATHETERIZATION N/A 03/08/2016   Procedure: Left Heart Cath and Coronary Angiography;  Surgeon: JJettie Booze MD;  [facility];  Service: Cardiovascular;  Laterality: N/A;   COLONOSCOPY  Aenomatous polyps   07/05/2011   COLONOSCOPY N/A 09/28/2014   Procedure: COLONOSCOPY;  Surgeon: MLadene Artist MD;  [facility]: [facility];  Service: Endoscopy;  Laterality: N/A;   CORONARY ANGIOPLASTY WITH STENT PLACEMENT     LEFT HEART CATH AND CORONARY ANGIOGRAPHY N/A 12/09/2018   Procedure: LEFT HEART CATH AND CORONARY ANGIOGRAPHY;  Surgeon: Taylor Booze, MD;  Location: [facility];  Service: Cardiovascular;  Laterality: N/A;   LEFT HEART CATHETERIZATION WITH CORONARY ANGIOGRAM N/A 07/17/2012   Procedure: LEFT HEART CATHETERIZATION WITH CORONARY ANGIOGRAM;  Surgeon: Taylor Bow, MD;  Location[facility] CATH LAB;  Service: Cardiovascular;  Laterality: N/A;   PERCUTANEOUS CORONARY STENT INTERVENTION (PCI-S) N/A 11/19/2011   Procedure: PERCUTANEOUS CORONARY STENT INTERVENTION (PCI-S);  Surgeon: Taylor Bow, MD;  Location: [facility] CATH LAB;  Service: Cardiovascular;  Laterality: N/A;   POLYPECTOMY     SHOULDER ARTHROSCOPY W/ ROTATOR CUFF REPAIR     right   TOTAL THYROIDECTOMY  2007   GOITER     Current Medications: No outpatient medications have been marked as taking for the 03/14/22 encounter (Appointment) with Taylor Burn, PA-C.     Allergies:   Zetia [ezetimibe]   Social History   Socioeconomic History   Marital status: Married    Spouse name: Not on file   Number of children: 4   Years of education: 14   Highest education level: Not on file  Occupational History   Occupation: Sales person at [employer]   Smoking status: Never   Smokeless tobacco: Never  Vaping Use   Vaping Use: Never used  Substance and Sexual Activity   Alcohol use: Not Currently   Drug use: No   Sexual activity: Not Currently  Other Topics Concern   Not on file  Social History Narrative   Lives at home alone.  Her son lives near her.   Right-handed.   1 cup coffee per day.   Social Determinants of Health   Financial Resource Strain: Not on file  Food Insecurity: Not on file  Transportation Needs: Not on file  Physical Activity: Not on file  Stress: Not on file  Social Connections: Not on file     Family History:  The patient's ***family history includes Blindness in her sister; Cancer in her brother and brother; Colon cancer in her brother; Congestive Heart Failure in her sister; Healthy in her daughter, daughter, daughter, and son; Heart attack in her father; Heart disease in her father; Hypertension in her father and sister; Thyroid disease in her sister.   ROS:   Please see the history of present illness.    ROS All other systems reviewed and are negative.   PHYSICAL EXAM:   VS:  There were no vitals taken for this visit.  Physical Exam  GEN: Well nourished, well developed, in no acute distress  HEENT: normal  Neck: no JVD, carotid bruits, or masses Cardiac:RRR; no murmurs, rubs, or gallops  Respiratory:  clear to auscultation bilaterally, normal work of breathing GI: soft, nontender, nondistended, + BS Ext: without cyanosis, clubbing, or edema, Good distal  pulses bilaterally MS: no deformity or atrophy  Skin: warm and dry, no rash Neuro:  Alert and Oriented x 3, Strength and sensation are intact Psych: euthymic mood, full affect  Wt Readings from Last 3 Encounters:  02/21/22 193 lb 6.4 oz (87.7 kg)  12/11/21 195 lb 12.8 oz (88.8 kg)  10/23/21 198 lb 3.2 oz (89.9 kg)      Studies/Labs Reviewed:   EKG:  EKG is*** ordered today.  The ekg ordered today demonstrates ***  Recent Labs: 09/13/2021: NT-Pro BNP 288 01/23/2022: Hemoglobin 13.7; Platelets 247.0; TSH 2.67 02/12/2022: ALT 11; BUN 19; Creatinine, Ser 1.09; Potassium 4.0; Sodium 141   Lipid Panel    Component Value Date/Time   CHOL  195 01/01/2022 0832   TRIG 234 (H) 01/01/2022 0832   HDL 46 01/01/2022 0832   CHOLHDL 4.2 01/01/2022 0832   CHOLHDL 3 12/28/2020 1020   VLDL 40.2 (H) 12/28/2020 1020   LDLCALC 109 (H) 01/01/2022 0832   LDLDIRECT 84.0 12/28/2020 1020    Additional studies/ records that were reviewed today include:  ***   Risk Assessment/Calculations:   {Does this patient have ATRIAL FIBRILLATION?[phone]}     ASSESSMENT:    No diagnosis found.   PLAN:  In order of problems listed above:  CAD NSTEMI 11/2012DESx2 cath 11/2018 25%LAD, OM 100% L-R coll, RCA patent stent, normal LVEF  HTN  HLD  Shared Decision Making/Informed Consent   {Are you ordering a CV Procedure (e.g. stress test, cath, DCCV, TEE, etc)?   Press F2        :[id number]}    Medication Adjustments/Labs and Tests Ordered: Current medicines are reviewed at length with the patient today.  Concerns regarding medicines are outlined above.  Medication changes, Labs and Tests ordered today are listed in the Patient Instructions below. There are no Patient Instructions on file for this visit.   Sumner Boast, PA-C  02/28/2022 7:47 AM    [facility] [address], [place], [place]  [address] Phone: [phone]; Fax: [phone]

## 2022-03-09 NOTE — Progress Notes (Deleted)
Subjective:    Patient ID: [name], adult    DOB: 1946-08-18, 76 y.o.   MRN: [id number]  No chief complaint on file.   HPI Patient is in today for a follow up on chronic conditions.   Past Medical History:  Diagnosis Date   Acute MI, subendocardial (HCC)    Arthritis    Back pain 05/31/2013   Benign paroxysmal positional vertigo 08/05/2016   CAD (coronary artery disease)    a. NSTEMI 10/2011: LHC 11/19/11: pLAD 30%, oOM 60%, AVCFX 30%, CFX after OM2 30%, pRCA 60 and 70%, then 99%, AM 80-90% with TIMI 3 flow.  PCI: Promus DES x 2 to RCA; b. 06/2012 Cath: patent RCA stents w/ subtl occl of Acute Marginal (jailed)->Med rx; c. 05/2015 MV: EF 59%, mod mid infsept/inf/ap lat/ap infarct with peri-infarct isch-->Med Rx; d. 02/2016 Cath: LM nl, LAD 45m, RI 50, RCA patent stents.   Colitis    Facial skin lesion 01/28/2017   GERD (gastroesophageal reflux disease)    occasional   Hyperlipidemia    Hypertension    Hypertensive heart disease    a. Echocardiogram 11/19/11: Difficult acoustic windows, EF 60-65%, normal LV wall thickness, grade 1 diastolic dysfunction   Hypoparathyroidism (HCC)    Low zinc level 11/26/2016   Multinodular thyroid    Goiter s/p thyroidectomy in 2007 with post-op  hypocalcemia and post-op hypothyroidism/hypoparathyroidism with hypocalcemia   Neck pain on right side 07/13/2013   Nocturia 05/31/2013   Osteoarthritis    Pain in right axilla 03/13/2016   Palpitations    a. 03/2012 - patient set up for event monitor but did not wear correctly -  she declined wearing a repeat monitor   Post-surgical hypothyroidism    Sun-damaged skin 12/05/2014   Tubular adenoma of colon 06/2011   Unspecified constipation 05/31/2013   Vertigo    Zinc deficiency 11/26/2015    Past Surgical History:  Procedure Laterality Date   CARDIAC CATHETERIZATION N/A 03/08/2016   Procedure: Left Heart Cath and Coronary Angiography;  Surgeon: Corky Crafts, MD;  Location: [facility] INVASIVE CV LAB;   Service: Cardiovascular;  Laterality: N/A;   COLONOSCOPY  Aenomatous polyps   07/05/2011   COLONOSCOPY N/A 09/28/2014   Procedure: COLONOSCOPY;  Surgeon: Meryl Dare, MD;  Location: [facility] ENDOSCOPY;  Service: Endoscopy;  Laterality: N/A;   CORONARY ANGIOPLASTY WITH STENT PLACEMENT     LEFT HEART CATH AND CORONARY ANGIOGRAPHY N/A 12/09/2018   Procedure: LEFT HEART CATH AND CORONARY ANGIOGRAPHY;  Surgeon: Corky Crafts, MD;  Location: [facility] INVASIVE CV LAB;  Service: Cardiovascular;  Laterality: N/A;   LEFT HEART CATHETERIZATION WITH CORONARY ANGIOGRAM N/A 07/17/2012   Procedure: LEFT HEART CATHETERIZATION WITH CORONARY ANGIOGRAM;  Surgeon: Herby Abraham, MD;  Location: [facility] CATH LAB;  Service: Cardiovascular;  Laterality: N/A;   PERCUTANEOUS CORONARY STENT INTERVENTION (PCI-S) N/A 11/19/2011   Procedure: PERCUTANEOUS CORONARY STENT INTERVENTION (PCI-S);  Surgeon: Herby Abraham, MD;  Location: [facility] CATH LAB;  Service: Cardiovascular;  Laterality: N/A;   POLYPECTOMY     SHOULDER ARTHROSCOPY W/ ROTATOR CUFF REPAIR     right   TOTAL THYROIDECTOMY  2007   GOITER    Family History  Problem Relation Age of Onset   Colon cancer Brother    Cancer Brother        COLON   Hypertension Father    Heart disease Father    Heart attack Father    Blindness Sister    Congestive Heart Failure  Sister    Hypertension Sister    Healthy Daughter    Healthy Son    Thyroid disease Sister    Cancer Brother        multiple myelomas   Healthy Daughter    Healthy Daughter    Diabetes Neg Hx    Prostate cancer Neg Hx    Breast cancer Neg Hx    Esophageal cancer Neg Hx    Rectal cancer Neg Hx    Stomach cancer Neg Hx     Social History   Socioeconomic History   Marital status: Married    Spouse name: Not on file   Number of children: 4   Years of education: 14   Highest education level: Not on file  Occupational History   Occupation: Sales person at [employer]  Tobacco Use   Smoking status:  Never   Smokeless tobacco: Never  Vaping Use   Vaping Use: Never used  Substance and Sexual Activity   Alcohol use: Not Currently   Drug use: No   Sexual activity: Not Currently  Other Topics Concern   Not on file  Social History Narrative   Lives at home alone.  Her son lives near her.   Right-handed.   1 cup coffee per day.   Social Determinants of Health   Financial Resource Strain: Not on file  Food Insecurity: Not on file  Transportation Needs: Not on file  Physical Activity: Not on file  Stress: Not on file  Social Connections: Not on file  Intimate Partner Violence: Not on file    Outpatient Medications Prior to Visit  Medication Sig Dispense Refill   ALPRAZolam (XANAX) 0.25 MG tablet Take 1 tablet (0.25 mg total) by mouth 2 (two) times daily as needed for anxiety. 30 tablet 0   amLODipine (NORVASC) 10 MG tablet TAKE 1 TABLET BY MOUTH  DAILY 90 tablet 2   aspirin EC 81 MG tablet Take 81 mg by mouth daily.     B Complex-C (B-COMPLEX WITH VITAMIN C) tablet Take 1 tablet by mouth daily.     bisoprolol (ZEBETA) 5 MG tablet TAKE 1 TABLET BY MOUTH  DAILY 90 tablet 2   calcitRIOL (ROCALTROL) 0.25 MCG capsule Take 3 capsules (0.75 mcg total) by mouth daily. 540 capsule 1   Cholecalciferol (VITAMIN D-3) 25 MCG (1000 UT) CAPS Take 3000-4000 units by mouth daily 60 capsule 0   clopidogrel (PLAVIX) 75 MG tablet Take 1 tablet (75 mg total) by mouth daily. 90 tablet 3   Evolocumab (REPATHA SURECLICK) 140 MG/ML SOAJ INJECT 140 MG  SUBCUTANEOUSLY EVERY 2  WEEKS 6 mL 3   fluconazole (DIFLUCAN) 150 MG tablet Take 1 tablet (150 mg total) by mouth once a week. 4 tablet 2   furosemide (LASIX) 20 MG tablet TAKE 1 TABLET BY MOUTH ONCE WEEKLY AS NEEDED 15 tablet 2   isosorbide mononitrate (IMDUR) 60 MG 24 hr tablet TAKE 1 TABLET BY MOUTH  DAILY 90 tablet 2   levothyroxine (SYNTHROID) 112 MCG tablet Take 1 tablet (112 mcg total) by mouth daily before breakfast. 90 tablet 1   LOTEMAX SM 0.38 %  GEL      Multiple Vitamin (MULTIVITAMIN WITH MINERALS) TABS tablet Take 1 tablet by mouth daily.     neomycin-polymyxin-dexameth (MAXITROL) 0.1 % OINT neomycin 3.5 mg/g-polymyxin B 10,000 unit/g-dexameth 0.1 % eye oint     nitroGLYCERIN (NITROSTAT) 0.4 MG SL tablet DISSOLVE 1 TABLET UNDER THE TONGUE EVERY 5 MINUTES AS  NEEDED FOR  CHEST PAIN. MAX  OF 3 TABLETS IN 15 MINUTES. CALL 911 IF PAIN PERSISTS. 75 tablet 2   pantoprazole (PROTONIX) 40 MG tablet TAKE 1 TABLET BY MOUTH  DAILY 90 tablet 3   PROLENSA 0.07 % SOLN      rosuvastatin (CRESTOR) 40 MG tablet TAKE 1 TABLET BY MOUTH  DAILY 90 tablet 2   No facility-administered medications prior to visit.    Allergies  Allergen Reactions   Zetia [Ezetimibe] Other (See Comments)    Myalgia, paresthesias and weakness    ROS     Objective:    Physical Exam  There were no vitals taken for this visit. Wt Readings from Last 3 Encounters:  02/21/22 193 lb 6.4 oz (87.7 kg)  12/11/21 195 lb 12.8 oz (88.8 kg)  10/23/21 198 lb 3.2 oz (89.9 kg)    Diabetic Foot Exam - Simple   No data filed    Lab Results  Component Value Date   WBC 7.7 01/23/2022   HGB 13.7 01/23/2022   HCT 42.0 01/23/2022   PLT 247.0 01/23/2022   GLUCOSE 101 (H) 02/12/2022   CHOL 195 01/01/2022   TRIG 234 (H) 01/01/2022   HDL 46 01/01/2022   LDLDIRECT 84.0 12/28/2020   LDLCALC 109 (H) 01/01/2022   ALT 11 02/12/2022   AST 15 02/12/2022   NA 141 02/12/2022   K 4.0 02/12/2022   CL 103 02/12/2022   CREATININE 1.09 02/12/2022   BUN 19 02/12/2022   CO2 33 (H) 02/12/2022   TSH 2.67 01/23/2022   INR 0.86 03/07/2016   HGBA1C 5.7 10/23/2021    Lab Results  Component Value Date   TSH 2.67 01/23/2022   Lab Results  Component Value Date   WBC 7.7 01/23/2022   HGB 13.7 01/23/2022   HCT 42.0 01/23/2022   MCV 88.2 01/23/2022   PLT 247.0 01/23/2022   Lab Results  Component Value Date   NA 141 02/12/2022   K 4.0 02/12/2022   CO2 33 (H) 02/12/2022   GLUCOSE  101 (H) 02/12/2022   BUN 19 02/12/2022   CREATININE 1.09 02/12/2022   BILITOT 0.5 02/12/2022   ALKPHOS 73 02/12/2022   AST 15 02/12/2022   ALT 11 02/12/2022   PROT 6.8 02/12/2022   ALBUMIN 4.1 02/12/2022   CALCIUM 8.6 02/12/2022   ANIONGAP 7 02/25/2019   EGFR 51 (L) 09/13/2021   GFR 49.88 (L) 02/12/2022   Lab Results  Component Value Date   CHOL 195 01/01/2022   Lab Results  Component Value Date   HDL 46 01/01/2022   Lab Results  Component Value Date   LDLCALC 109 (H) 01/01/2022   Lab Results  Component Value Date   TRIG 234 (H) 01/01/2022   Lab Results  Component Value Date   CHOLHDL 4.2 01/01/2022   Lab Results  Component Value Date   HGBA1C 5.7 10/23/2021       Assessment & Plan:   Problem List Items Addressed This Visit   None   I am having Aleyna T. Henkin maintain her aspirin EC, B-complex with vitamin C, multivitamin with minerals, ALPRAZolam, Vitamin D-3, Prolensa, Lotemax SM, neomycin-polymyxin-dexameth, fluconazole, calcitRIOL, Repatha SureClick, pantoprazole, rosuvastatin, isosorbide mononitrate, amLODipine, bisoprolol, furosemide, clopidogrel, levothyroxine, and nitroGLYCERIN.  No orders of the defined types were placed in this encounter.

## 2022-03-12 ENCOUNTER — Ambulatory Visit: Payer: Medicare Other | Admitting: Family Medicine

## 2022-03-12 DIAGNOSIS — Z0000 Encounter for general adult medical examination without abnormal findings: Secondary | ICD-10-CM

## 2022-03-14 ENCOUNTER — Other Ambulatory Visit: Payer: Self-pay

## 2022-03-14 ENCOUNTER — Ambulatory Visit (INDEPENDENT_AMBULATORY_CARE_PROVIDER_SITE_OTHER): Payer: Medicare Other | Admitting: Physician Assistant

## 2022-03-14 ENCOUNTER — Encounter: Payer: Self-pay | Admitting: Physician Assistant

## 2022-03-14 VITALS — BP 144/70 | HR 64 | Ht 65.0 in | Wt 194.0 lb

## 2022-03-14 DIAGNOSIS — I10 Essential (primary) hypertension: Secondary | ICD-10-CM | POA: Diagnosis not present

## 2022-03-14 DIAGNOSIS — R002 Palpitations: Secondary | ICD-10-CM | POA: Diagnosis not present

## 2022-03-14 DIAGNOSIS — E785 Hyperlipidemia, unspecified: Secondary | ICD-10-CM

## 2022-03-14 DIAGNOSIS — I2510 Atherosclerotic heart disease of native coronary artery without angina pectoris: Secondary | ICD-10-CM

## 2022-03-14 NOTE — Patient Instructions (Addendum)
Medication Instructions:  ?Your physician recommends that you continue on your current medications as directed. Please refer to the Current Medication list given to you today. ? ?*If you need a refill on your cardiac medications before your next appointment, please call your pharmacy* ? ? ?Lab Work: ?None ?If you have labs (blood work) drawn today and your tests are completely normal, you will receive your results only by: ?MyChart Message (if you have MyChart) OR ?A paper copy in the mail ?If you have any lab test that is abnormal or we need to change your treatment, we will call you to review the results. ? ? ?Follow-Up: ?At [facility], you and your health needs are our priority.  As part of our continuing mission to provide you with exceptional heart care, we have created designated Provider Care Teams.  These Care Teams include your primary Cardiologist (physician) and Advanced Practice Providers (APPs -  Physician Assistants and Nurse Practitioners) who all work together to provide you with the care you need, when you need it. ? ?We recommend signing up for the patient portal called "MyChart".  Sign up information is provided on this After Visit Summary.  MyChart is used to connect with patients for Virtual Visits (Telemedicine).  Patients are able to view lab/test results, encounter notes, upcoming appointments, etc.  Non-urgent messages can be sent to your provider as well.   ?To learn more about what you can do with MyChart, go to [email or web address].   ? ?Your next appointment: ?As scheduled ? ? ?Other Instructions ?Decrease Caffeine intake.  ?You may use Claritin or Zyrtec without decongestant. ?Take Mucinex for congestion  ?

## 2022-03-17 ENCOUNTER — Encounter (HOSPITAL_BASED_OUTPATIENT_CLINIC_OR_DEPARTMENT_OTHER): Payer: Self-pay

## 2022-03-17 ENCOUNTER — Emergency Department (HOSPITAL_BASED_OUTPATIENT_CLINIC_OR_DEPARTMENT_OTHER): Payer: Medicare Other

## 2022-03-17 ENCOUNTER — Other Ambulatory Visit: Payer: Self-pay

## 2022-03-17 ENCOUNTER — Emergency Department (HOSPITAL_BASED_OUTPATIENT_CLINIC_OR_DEPARTMENT_OTHER)
Admission: EM | Admit: 2022-03-17 | Discharge: 2022-03-17 | Disposition: A | Discharge status: Home / Self Care | Payer: Medicare Other | Attending: Emergency Medicine | Admitting: Emergency Medicine

## 2022-03-17 DIAGNOSIS — Z7982 Long term (current) use of aspirin: Secondary | ICD-10-CM | POA: Diagnosis not present

## 2022-03-17 DIAGNOSIS — T7840XA Allergy, unspecified, initial encounter: Secondary | ICD-10-CM | POA: Diagnosis not present

## 2022-03-17 DIAGNOSIS — R051 Acute cough: Secondary | ICD-10-CM | POA: Diagnosis not present

## 2022-03-17 DIAGNOSIS — R059 Cough, unspecified: Secondary | ICD-10-CM | POA: Insufficient documentation

## 2022-03-17 MED ORDER — BENZONATATE 100 MG PO CAPS
100.0000 mg | ORAL_CAPSULE | Freq: Three times a day (TID) | ORAL | 0 refills | Status: DC
Start: 2022-03-17 — End: 2022-04-17

## 2022-03-17 NOTE — Discharge Instructions (Signed)
Your x-ray today was negative for pneumonia.  I suspect that your symptoms are likely caused from allergies which is causing postnasal drip down into your lungs.  Continue taking Zyrtec daily to help with your decongestion as you are cardiologist recommends that you avoid using medication such as Mucinex.  I have also sent you in a prescription for Tessalon which you can take at night if your having trouble sleeping due to your cough. ? ?To help with your allergies, in addition to the Zyrtec, you can do nasal flushes twice daily either with a Nettie pot or with a saline flush that you can get over-the-counter.  If this continues to be a problem, please follow-up with your primary care doctor. ?

## 2022-03-17 NOTE — ED Triage Notes (Signed)
Productive cough x 3 days. Negative home covid test. Sore throat last week. ?

## 2022-03-17 NOTE — ED Provider Notes (Signed)
?[facility] ?Provider Note ? ? ?CSN: [id number] ?Arrival date & time: 03/17/22  1729 ? ?  ? ?History ? ?Chief Complaint  ?Patient presents with  ? Cough  ? ? ?[name] is a 76 y.o. adult who presents to the ED for evaluation of productive cough for 3 to 4 days.  Patient states that when she coughs, she feels something in her chest and coughs up light green phlegm.  She denies fever, chills, sore throat, congestion, abdominal pain, nausea, vomiting and diarrhea.  Patient reportedly saw her cardiologist where she mentioned the symptoms.  She was told that it is likely allergies and to avoid taking decongestants such as Mucinex and instead use Zyrtec solo.  Patient takes that occasionally with some relief but symptoms continue to return.  Today she is concerned that she has pneumonia given the severity of her cough. ? ? ?Cough ? ?  ? ?Home Medications ?Prior to Admission medications   ?Medication Sig Start Date End Date Taking? Authorizing Provider  ?benzonatate (TESSALON) 100 MG capsule Take 1 capsule (100 mg total) by mouth every 8 (eight) hours. 03/17/22  Yes Tonye Pearson, PA-C  ?ALPRAZolam (XANAX) 0.25 MG tablet Take 1 tablet (0.25 mg total) by mouth 2 (two) times daily as needed for anxiety. ?Patient not taking: Reported on 03/14/2022 01/19/19   Mosie Lukes, MD  ?amLODipine (NORVASC) 10 MG tablet TAKE 1 TABLET BY MOUTH  DAILY 01/03/22   Fay Records, MD  ?aspirin EC 81 MG tablet Take 81 mg by mouth daily. 11/22/11   Charlie Pitter, PA-C  ?B Complex-C (B-COMPLEX WITH VITAMIN C) tablet Take 1 tablet by mouth daily.    [provider]  ?bisoprolol (ZEBETA) 5 MG tablet TAKE 1 TABLET BY MOUTH  DAILY 01/03/22   Fay Records, MD  ?calcitRIOL (ROCALTROL) 0.25 MCG capsule Take 3 capsules (0.75 mcg total) by mouth daily. 03/29/21   Fay Records, MD  ?Cholecalciferol (VITAMIN D-3) 25 MCG (1000 UT) CAPS Take 3000-4000 units by mouth daily 03/16/20   Fay Records, MD   ?clopidogrel (PLAVIX) 75 MG tablet Take 1 tablet (75 mg total) by mouth daily. 02/21/22   Terrilyn Saver, NP  ?Evolocumab (REPATHA SURECLICK) 109 MG/ML SOAJ INJECT 140 MG  SUBCUTANEOUSLY EVERY 2  WEEKS 07/27/21   Fay Records, MD  ?fluconazole (DIFLUCAN) 150 MG tablet Take 1 tablet (150 mg total) by mouth once a week. 02/14/21   Evelina Bucy, DPM  ?furosemide (LASIX) 20 MG tablet TAKE 1 TABLET BY MOUTH ONCE WEEKLY AS NEEDED 01/24/22   Fay Records, MD  ?isosorbide mononitrate (IMDUR) 60 MG 24 hr tablet TAKE 1 TABLET BY MOUTH  DAILY 01/03/22   Fay Records, MD  ?levothyroxine (SYNTHROID) 112 MCG tablet Take 1 tablet (112 mcg total) by mouth daily before breakfast. 02/21/22   Terrilyn Saver, NP  ?Amador Cunas SM 0.38 % GEL  09/01/20   [provider]  ?Multiple Vitamin (MULTIVITAMIN WITH MINERALS) TABS tablet Take 1 tablet by mouth daily.    [provider]  ?neomycin-polymyxin-dexameth (MAXITROL) 0.1 % OINT neomycin 3.5 mg/g-polymyxin B 10,000 unit/g-dexameth 0.1 % eye oint    [provider]  ?nitroGLYCERIN (NITROSTAT) 0.4 MG SL tablet DISSOLVE 1 TABLET UNDER THE TONGUE EVERY 5 MINUTES AS  NEEDED FOR CHEST PAIN. MAX  OF 3 TABLETS IN 15 MINUTES. CALL 911 IF PAIN PERSISTS. 02/21/22   Terrilyn Saver, NP  ?pantoprazole (PROTONIX) 40  MG tablet TAKE 1 TABLET BY MOUTH  DAILY 10/10/21   Mosie Lukes, MD  ?PROLENSA 0.07 % SOLN  09/01/20   [provider]  ?rosuvastatin (CRESTOR) 40 MG tablet TAKE 1 TABLET BY MOUTH  DAILY 01/03/22   Fay Records, MD  ?   ? ?Allergies    ?Zetia [ezetimibe]   ? ?Review of Systems   ?Review of Systems  ?Respiratory:  Positive for cough.   ? ?Physical Exam ?Updated Vital Signs ?BP (!) 145/82 (BP Location: Left Arm)   Pulse 65   Temp 97.8 ?F (36.6 ?C) (Oral)   Resp 18   Ht '5\' 5"'$  (1.651 m)   Wt 88.5 kg   SpO2 99%   BMI 32.45 kg/m?  ?Physical Exam ?Vitals and nursing note reviewed.  ?Constitutional:   ?   General: She is not in acute distress. ?   Appearance: She is  not ill-appearing.  ?HENT:  ?   Head: Atraumatic.  ?Eyes:  ?   Conjunctiva/sclera: Conjunctivae normal.  ?Cardiovascular:  ?   Rate and Rhythm: Normal rate and regular rhythm.  ?   Pulses: Normal pulses.  ?   Heart sounds: No murmur heard. ?Pulmonary:  ?   Effort: Pulmonary effort is normal. No respiratory distress.  ?   Breath sounds: Normal breath sounds.  ?Abdominal:  ?   General: Abdomen is flat. There is no distension.  ?   Palpations: Abdomen is soft.  ?   Tenderness: There is no abdominal tenderness.  ?Musculoskeletal:     ?   General: Normal range of motion.  ?   Cervical back: Normal range of motion.  ?Skin: ?   General: Skin is warm and dry.  ?   Capillary Refill: Capillary refill takes less than 2 seconds.  ?Neurological:  ?   General: No focal deficit present.  ?   Mental Status: She is alert.  ?Psychiatric:     ?   Mood and Affect: Mood normal.  ? ? ?ED Results / Procedures / Treatments   ?Labs ?(all labs ordered are listed, but only abnormal results are displayed) ?Labs Reviewed - No data to display ? ?EKG ?None ? ?Radiology ?DG Chest 2 View ? ?Result Date: 03/17/2022 ?CLINICAL DATA:  Cough for 3 days EXAM: CHEST - 2 VIEW COMPARISON:  Chest radiograph dated 02/25/2019 FINDINGS: The heart size is normal. Vascular calcifications are seen in the aortic arch. Both lungs are clear. The visualized skeletal structures are unremarkable. IMPRESSION: No active cardiopulmonary disease. Aortic Atherosclerosis (ICD10-I70.0). Electronically Signed   By: Zerita Boers M.D.   On: 03/17/2022 18:00   ? ?Procedures ?Procedures  ? ? ?Medications Ordered in ED ?Medications - No data to display ? ?ED Course/ Medical Decision Making/ A&P ?  ?                        ?Medical Decision Making ?Amount and/or Complexity of Data Reviewed ?Radiology: ordered. ? ?Risk ?Prescription drug management. ? ? ?History:  ?Per HPI ?Social determinants of health: None ? ?Initial impression: ? ?This patient presents to the ED for concern of  productive cough, this involves an extensive number of treatment options, and is a complaint that carries with it a high risk of complications and morbidity.   Differential diagnosis for emergent cause of cough includes but is not limited to upper respiratory infection, lower respiratory infection, allergies, asthma, irritants, foreign body, medications such as ACE inhibitors, reflux, asthma, CHF,  lung cancer, interstitial lung disease, psychiatric causes, postnasal drip and postinfectious bronchospasm. ? ?Patient is overall well-appearing in no acute distress.  Her vitals are normal here in the emergency department and is without tachycardia or fever.  Lungs were CTA bilaterally and she does not appeared congested or acutely ill.  Chest x-ray without signs of pneumonia.  Given patient's symptoms and history, I am inclined to inquire with her cardiologist that her symptoms are secondary to allergies and postnasal drip.  She notes that her coughing is keeping her up at night, so I will prescribe Tessalon pearls for symptomatic relief.  Advised her to take Zyrtec every day and not just as needed to get better control of her symptoms.  Also discussed other at home supportive care measures including Nettie pot or saline flushes to help with pollen.  Patient expresses understanding and amenable to plan. ? ? ?Imaging Studies ordered: ? ?I ordered imaging studies including  ?Chest x-ray without acute findings ?I independently visualized and interpreted imaging and I agree with the radiologist interpretation.  ? ?Disposition: ? ?After consideration of the diagnostic results, physical exam, history and the patients response to treatment feel that the patent would benefit from discharge.   ?Allergies: Plan and management as described above.  All questions were asked and answered, return precautions were discussed and patient was discharged home in good condition. ? ? ? ?Final Clinical Impression(s) / ED Diagnoses ?Final  diagnoses:  ?Allergy, initial encounter  ? ? ?Rx / DC Orders ?ED Discharge Orders   ? ?      Ordered  ?  benzonatate (TESSALON) 100 MG capsule  Every 8 hours       ? 03/17/22 1843  ? ?  ?  ? ?  ? ? ?  ?Tonye Pearson, PA-C

## 2022-03-26 DIAGNOSIS — I83811 Varicose veins of right lower extremities with pain: Secondary | ICD-10-CM | POA: Diagnosis not present

## 2022-03-26 DIAGNOSIS — I83891 Varicose veins of right lower extremities with other complications: Secondary | ICD-10-CM | POA: Diagnosis not present

## 2022-03-26 DIAGNOSIS — M7989 Other specified soft tissue disorders: Secondary | ICD-10-CM | POA: Diagnosis not present

## 2022-04-10 DIAGNOSIS — M19011 Primary osteoarthritis, right shoulder: Secondary | ICD-10-CM | POA: Diagnosis not present

## 2022-04-10 DIAGNOSIS — M542 Cervicalgia: Secondary | ICD-10-CM | POA: Diagnosis not present

## 2022-04-13 ENCOUNTER — Telehealth: Payer: Self-pay | Admitting: *Deleted

## 2022-04-13 NOTE — Telephone Encounter (Signed)
? ?  Pre-operative Risk Assessment  ?  ?Patient Name: [name]  ?DOB: [birth year] ?MRN: [id number]  ? ?  ? ?Request for Surgical Clearance   ? ?Procedure:   LUMBAR CERVICAL ESI INJECTION ? ?Date of Surgery:  Clearance 05/03/22                              ?   ?Surgeon:  DR. Suella Broad ?Surgeon's Group or Practice Name:  [facility] ?Phone number:  [phone] ?Fax number:  [phone] ?  ?Type of Clearance Requested:   ?- Medical  ?- Pharmacy:  Hold Clopidogrel (Plavix) x 7 DAYS PRIOR ?  ?Type of Anesthesia:  Not Indicated ?  ?Additional requests/questions:   ? ?Signed, ?Julaine Hua   ?04/13/2022, 5:50 PM  ? ?

## 2022-04-16 NOTE — Progress Notes (Signed)
? ?Cardiology Office Note   ? ?Date:  04/17/2022  ? ?ID:  [name], DOB June 17, 1946, MRN [id number] ? ? ?PCP:  Mosie Lukes, MD ?  ?[name]  ?Cardiologist:  Dorris Carnes, MD   ?Advanced Practice Provider:  No care team member to display ?Electrophysiologist:  None  ? ?[id number]}  ? ?No chief complaint on file. ? ? ?History of Present Illness:  ?[name] is a 76 y.o. adult  with CAD - s/p NSTEMI 10/2011 with DESx2   Other issues include thyroidectomy, GERD, HTN, HLD, vertigo.  Last myovue showed inferolateral scar with minimal periinfarct ischemia. ?  ? The pt was seen in ED for CP in December 2019   L heart cath in Dec 2019 showed:  LAD with 25%  OM  with 100% with Left to R  collaterals  RCA with patent stent   Normal LVEF Echo in early Sept 2020   LVEF and RVEF were normal   Mild diastolic dysfunction   ?  ?Patient saw Dr. Harrington Challenger 08/2021 and told her while in [place] she had leg swelling and told she had a blood clot-no blood thinners given and stayed at bed rest. F/u US neg DVT. ? ?I saw the patient 03/14/22 and had palpitations. I asked her to cut back on excessive caffeine. ? ?Patient here for preop clearance for lumbar cervical esi injection 05/03/22 by Dr. Suella Broad [facility]. Palpitations have resolved since she stopped caffeine. Denies chest pain, dyspnea, dizziness or presyncope. Walks 30 min on treadmill daily. ?  ? ? ? ?Past Medical History:  ?Diagnosis Date  ? Acute MI, subendocardial ([place])   ? Arthritis   ? Back pain 05/31/2013  ? Benign paroxysmal positional vertigo 08/05/2016  ? CAD (coronary artery disease)   ? a. NSTEMI 10/2011: [place] 11/19/11: pLAD 30%, oOM 60%, AVCFX 30%, CFX after OM2 30%, pRCA 60 and 70%, then 99%, AM 80-90% with TIMI 3 flow.  PCI: Promus DES x 2 to RCA; b. 06/2012 Cath: patent RCA stents w/ subtl occl of Acute Marginal (jailed)->Med rx; c. 05/2015 MV: EF 59%, mod mid infsept/inf/ap lat/ap infarct with peri-infarct isch-->Med Rx; d. 02/2016  Cath: LM nl, LAD 58m RI 50, RCA patent stents.  ? Colitis   ? Facial skin lesion 01/28/2017  ? GERD (gastroesophageal reflux disease)   ? occasional  ? Hyperlipidemia   ? Hypertension   ? Hypertensive heart disease   ? a. Echocardiogram 11/19/11: Difficult acoustic windows, EF 60-65%, normal LV wall thickness, grade 1 diastolic dysfunction  ? [place]oparathyroidism ([place]   ? Low zinc level 11/26/2016  ? Multinodular thyroid   ? Goiter s/p thyroidectomy in 2007 with post-op  hypocalcemia and post-op hypothyroidism/hypoparathyroidism with hypocalcemia  ? Neck pain on right side 07/13/2013  ? Nocturia 05/31/2013  ? Osteoarthritis   ? Pain in right axilla 03/13/2016  ? Palpitations   ? a. 03/2012 - patient set up for event monitor but did not wear correctly -  she declined wearing a repeat monitor  ? Post-surgical hypothyroidism   ? Sun-damaged skin 12/05/2014  ? Tubular adenoma of colon 06/2011  ? Unspecified constipation 05/31/2013  ? Vertigo   ? Zinc deficiency 11/26/2015  ? ? ?Past Surgical History:  ?Procedure Laterality Date  ? CARDIAC CATHETERIZATION N/A 03/08/2016  ? Procedure: Left Heart Cath and Coronary Angiography;  Surgeon: JJettie Booze MD;  [facility];  Service: Cardiovascular;  Laterality: N/A;  ? COLONOSCOPY  Aenomatous polyps  ? 07/05/2011  ? COLONOSCOPY N/A 09/28/2014  ? Procedure: COLONOSCOPY;  Surgeon: Ladene Artist, MD;  Location: [facility];  Service: Endoscopy;  Laterality: N/A;  ? CORONARY ANGIOPLASTY WITH STENT PLACEMENT    ? LEFT HEART CATH AND CORONARY ANGIOGRAPHY N/A 12/09/2018  ? Procedure: LEFT HEART CATH AND CORONARY ANGIOGRAPHY;  Surgeon: Jettie Booze, MD;  Location: [facility];  Service: Cardiovascular;  Laterality: N/A;  ? LEFT HEART CATHETERIZATION WITH CORONARY ANGIOGRAM N/A 07/17/2012  ? Procedure: LEFT HEART CATHETERIZATION WITH CORONARY ANGIOGRAM;  Surgeon: Hillary Bow, MD;  Location: [facility]ATH LAB;  Service: Cardiovascular;  Laterality: N/A;  ?  PERCUTANEOUS CORONARY STENT INTERVENTION (PCI-S) N/A 11/19/2011  ? Procedure: PERCUTANEOUS CORONARY STENT INTERVENTION (PCI-S);  Surgeon: Hillary Bow, MD;  Location: [facility] CATH LAB;  Service: Cardiovascular;  Laterality: N/A;  ? POLYPECTOMY    ? SHOULDER ARTHROSCOPY W/ ROTATOR CUFF REPAIR    ? right  ? TOTAL THYROIDECTOMY  2007  ? GOITER  ? ? ?Current Medications: ?Current Meds  ?Medication Sig  ? aspirin EC 81 MG tablet Take 81 mg by mouth daily.  ? B Complex-C (B-COMPLEX WITH VITAMIN C) tablet Take 1 tablet by mouth daily.  ? bisoprolol (ZEBETA) 5 MG tablet TAKE 1 TABLET BY MOUTH  DAILY  ? Cholecalciferol (VITAMIN D-3) 25 MCG (1000 UT) CAPS Take 3000-4000 units by mouth daily  ? clopidogrel (PLAVIX) 75 MG tablet Take 1 tablet (75 mg total) by mouth daily.  ? Evolocumab (REPATHA SURECLICK) 824 MG/ML SOAJ INJECT 140 MG  SUBCUTANEOUSLY EVERY 2  WEEKS  ? furosemide (LASIX) 20 MG tablet TAKE 1 TABLET BY MOUTH ONCE WEEKLY AS NEEDED  ? levothyroxine (SYNTHROID) 112 MCG tablet Take 1 tablet (112 mcg total) by mouth daily before breakfast.  ? Multiple Vitamin (MULTIVITAMIN WITH MINERALS) TABS tablet Take 1 tablet by mouth daily.  ? nitroGLYCERIN (NITROSTAT) 0.4 MG SL tablet DISSOLVE 1 TABLET UNDER THE TONGUE EVERY 5 MINUTES AS  NEEDED FOR CHEST PAIN. MAX  OF 3 TABLETS IN 15 MINUTES. CALL 911 IF PAIN PERSISTS.  ? pantoprazole (PROTONIX) 40 MG tablet TAKE 1 TABLET BY MOUTH  DAILY  ? rosuvastatin (CRESTOR) 40 MG tablet TAKE 1 TABLET BY MOUTH  DAILY  ? [DISCONTINUED] amLODipine (NORVASC) 10 MG tablet TAKE 1 TABLET BY MOUTH  DAILY  ? [DISCONTINUED] benzonatate (TESSALON) 100 MG capsule Take 1 capsule (100 mg total) by mouth every 8 (eight) hours.  ? [DISCONTINUED] calcitRIOL (ROCALTROL) 0.25 MCG capsule Take 3 capsules (0.75 mcg total) by mouth daily.  ? [DISCONTINUED] fluconazole (DIFLUCAN) 150 MG tablet Take 1 tablet (150 mg total) by mouth once a week.  ? [DISCONTINUED] isosorbide mononitrate (IMDUR) 60 MG 24 hr tablet  TAKE 1 TABLET BY MOUTH  DAILY  ? [DISCONTINUED] LOTEMAX SM 0.38 % GEL   ? [DISCONTINUED] neomycin-polymyxin-dexameth (MAXITROL) 0.1 % OINT neomycin 3.5 mg/g-polymyxin B 10,000 unit/g-dexameth 0.1 % eye oint  ? [DISCONTINUED] PROLENSA 0.07 % SOLN   ?  ? ?Allergies:   Zetia [ezetimibe]  ? ?Social History  ? ?Socioeconomic History  ? Marital status: Married  ?  Spouse name: Not on file  ? Number of children: 4  ? Years of education: 56  ? Highest education level: Not on file  ?Occupational History  ? Occupation: Press photographer person at [employer]  ?Tobacco Use  ? Smoking status: Never  ? Smokeless tobacco: Never  ?Vaping Use  ? Vaping Use: Never used  ?Substance and Sexual Activity  ?  Alcohol use: Not Currently  ? Drug use: No  ? Sexual activity: Not Currently  ?Other Topics Concern  ? Not on file  ?Social History Narrative  ? Lives at home alone.  Her son lives near her.  ? Right-handed.  ? 1 cup coffee per day.  ? ?Social Determinants of Health  ? ?Financial Resource Strain: Not on file  ?Food Insecurity: Not on file  ?Transportation Needs: Not on file  ?Physical Activity: Not on file  ?Stress: Not on file  ?Social Connections: Not on file  ?  ? ?Family History:  The patient's  family history includes Blindness in her sister; Cancer in her brother and brother; Colon cancer in her brother; Congestive Heart Failure in her sister; Healthy in her daughter, daughter, daughter, and son; Heart attack in her father; Heart disease in her father; Hypertension in her father and sister; Thyroid disease in her sister.  ? ?ROS:   ?Please see the history of present illness.    ?ROS All other systems reviewed and are negative. ? ? ?PHYSICAL EXAM:   ?VS:  BP 108/63   Pulse 64   Ht '5\' 5"'$  (1.651 m)   Wt 192 lb (87.1 kg)   SpO2 95%   BMI 31.95 kg/m?   ?Physical Exam  ?GEN: Obese, in no acute distress  ?Neck: no JVD, carotid bruits, or masses ?Cardiac:RRR; no murmurs, rubs, or gallops  ?Respiratory:  clear to auscultation bilaterally, normal work  of breathing ?GI: soft, nontender, nondistended, + BS ?Ext: without cyanosis, clubbing, or edema, Good distal pulses bilaterally ?Neuro:  Alert and Oriented x 3, ?Psych: euthymic mood, full affect ? ?Wt Readings

## 2022-04-16 NOTE — Telephone Encounter (Signed)
Left message for the pt to call back for tele pre op appt.  ?

## 2022-04-16 NOTE — Telephone Encounter (Signed)
Appt with Ermalinda Barrios, [facility] 04/17/22. I had left message to set up a tele pre op appt. I am not sure if when the pt called back if she stated that she preferred an in office appt.  ?

## 2022-04-16 NOTE — Telephone Encounter (Signed)
? ? ?  Name: [name]  ?DOB: Jul 13, 1946  ?MRN: [id number] ? ?Primary Cardiologist: Dorris Carnes, MD ? ? ?Preoperative team, please contact this patient and set up a phone call appointment for further preoperative risk assessment. Please obtain consent and complete medication review. Thank you for your help. ? ?I confirm that guidance regarding antiplatelet and oral anticoagulation therapy has been completed and, if necessary, noted below. ? ?Per prior recommendation for injection, OK to hold plavix. If no interval change in cardiac history, it reasonable to follow this guidance. ? ? ?Ledora Bottcher, PA ?04/16/2022, 12:15 PM ?[facility] ?[address] ?[place], [place] [address] ? ? ?

## 2022-04-17 ENCOUNTER — Encounter: Payer: Self-pay | Admitting: Physician Assistant

## 2022-04-17 ENCOUNTER — Ambulatory Visit (INDEPENDENT_AMBULATORY_CARE_PROVIDER_SITE_OTHER): Payer: Medicare Other | Admitting: Physician Assistant

## 2022-04-17 VITALS — BP 108/63 | HR 64 | Ht 65.0 in | Wt 192.0 lb

## 2022-04-17 DIAGNOSIS — E892 Postprocedural hypoparathyroidism: Secondary | ICD-10-CM | POA: Diagnosis not present

## 2022-04-17 DIAGNOSIS — Z01818 Encounter for other preprocedural examination: Secondary | ICD-10-CM

## 2022-04-17 DIAGNOSIS — I2510 Atherosclerotic heart disease of native coronary artery without angina pectoris: Secondary | ICD-10-CM

## 2022-04-17 DIAGNOSIS — R002 Palpitations: Secondary | ICD-10-CM | POA: Diagnosis not present

## 2022-04-17 DIAGNOSIS — I10 Essential (primary) hypertension: Secondary | ICD-10-CM | POA: Diagnosis not present

## 2022-04-17 DIAGNOSIS — E785 Hyperlipidemia, unspecified: Secondary | ICD-10-CM

## 2022-04-17 MED ORDER — ISOSORBIDE MONONITRATE ER 60 MG PO TB24: 60.0000 mg | ORAL_TABLET | Freq: Every day | ORAL | 3 refills | Status: DC

## 2022-04-17 MED ORDER — AMLODIPINE BESYLATE 10 MG PO TABS: 10.0000 mg | ORAL_TABLET | Freq: Every day | ORAL | 3 refills | Status: DC

## 2022-04-17 MED ORDER — CALCITRIOL 0.25 MCG PO CAPS: 0.7500 ug | ORAL_CAPSULE | Freq: Every day | ORAL | 3 refills | Status: DC

## 2022-04-17 NOTE — Patient Instructions (Signed)

## 2022-04-18 ENCOUNTER — Ambulatory Visit: Payer: Medicare Other | Admitting: Obstetrics & Gynecology

## 2022-04-19 ENCOUNTER — Other Ambulatory Visit: Payer: Self-pay | Admitting: Internal Medicine

## 2022-04-19 DIAGNOSIS — I214 Non-ST elevation (NSTEMI) myocardial infarction: Secondary | ICD-10-CM

## 2022-04-19 DIAGNOSIS — I2510 Atherosclerotic heart disease of native coronary artery without angina pectoris: Secondary | ICD-10-CM

## 2022-04-25 ENCOUNTER — Ambulatory Visit: Payer: Medicare Other | Admitting: Physician Assistant

## 2022-05-03 DIAGNOSIS — M5412 Radiculopathy, cervical region: Secondary | ICD-10-CM | POA: Diagnosis not present

## 2022-05-03 DIAGNOSIS — M5416 Radiculopathy, lumbar region: Secondary | ICD-10-CM | POA: Diagnosis not present

## 2022-05-04 DIAGNOSIS — I87392 Chronic venous hypertension (idiopathic) with other complications of left lower extremity: Secondary | ICD-10-CM | POA: Diagnosis not present

## 2022-05-04 DIAGNOSIS — I872 Venous insufficiency (chronic) (peripheral): Secondary | ICD-10-CM | POA: Diagnosis not present

## 2022-05-07 ENCOUNTER — Telehealth: Payer: Self-pay

## 2022-05-07 ENCOUNTER — Ambulatory Visit (INDEPENDENT_AMBULATORY_CARE_PROVIDER_SITE_OTHER): Payer: Medicare Other

## 2022-05-07 VITALS — Ht 65.0 in | Wt 192.0 lb

## 2022-05-07 DIAGNOSIS — Z0000 Encounter for general adult medical examination without abnormal findings: Secondary | ICD-10-CM | POA: Diagnosis not present

## 2022-05-07 NOTE — Telephone Encounter (Signed)
? ?  Telephone encounter was:  Unsuccessful.  05/07/2022 ?Name: KANIAH RIZZOLO MRN: [id number] DOB: 09-03-1946 ? ?Unsuccessful outbound call made today to assist with:  Transportation Needs  ? ?Outreach Attempt:  1st Attempt ? ?A HIPAA compliant voice message was left requesting a return call.  Instructed patient to call back at earliest convenience. ?. ? ? ? ?Larena Sox ?Care Guide, Embedded Care Coordination ?[facility], Care Management  ?[phone] ?[address], [place], [place] [address] ?Phone: [phone] ?Email: [email or web address] ? ?  ?

## 2022-05-07 NOTE — Patient Instructions (Signed)
[name] , ?Thank you for taking time to complete your Medicare Wellness Visit. I appreciate your ongoing commitment to your health goals. Please review the following plan we discussed and let me know if I can assist you in the future.  ? ?Screening recommendations/referrals: ?Colonoscopy: Completed 03/02/2020-Due 03/03/2023 ?Mammogram: Due-Please call when you are ready to schedule. ?Bone Density: Completed 02/06/2021-Due 02/06/2023 ?Recommended yearly ophthalmology/optometry visit for glaucoma screening and checkup ?Recommended yearly dental visit for hygiene and checkup ? ?Vaccinations: ?Influenza vaccine: Up to date ?Pneumococcal vaccine: Up to date ?Tdap vaccine: Up to date ?Shingles vaccine: Declined   ?Covid-19:Declined booster ? ?Advanced directives: May pick up information at your  next visit ? ?Conditions/risks identified: See problem list ? ?Next appointment: Follow up in one year for your annual wellness visit  ? ? ?Preventive Care 48 Years and Older, Female ?Preventive care refers to lifestyle choices and visits with your health care provider that can promote health and wellness. ?What does preventive care include? ?A yearly physical exam. This is also called an annual well check. ?Dental exams once or twice a year. ?Routine eye exams. Ask your health care provider how often you should have your eyes checked. ?Personal lifestyle choices, including: ?Daily care of your teeth and gums. ?Regular physical activity. ?Eating a healthy diet. ?Avoiding tobacco and drug use. ?Limiting alcohol use. ?Practicing safe sex. ?Taking low-dose aspirin every day. ?Taking vitamin and mineral supplements as recommended by your health care provider. ?What happens during an annual well check? ?The services and screenings done by your health care provider during your annual well check will depend on your age, overall health, lifestyle risk factors, and family history of disease. ?Counseling  ?Your health care provider may ask  you questions about your: ?Alcohol use. ?Tobacco use. ?Drug use. ?Emotional well-being. ?Home and relationship well-being. ?Sexual activity. ?Eating habits. ?History of falls. ?Memory and ability to understand (cognition). ?Work and work Statistician. ?Reproductive health. ?Screening  ?You may have the following tests or measurements: ?Height, weight, and BMI. ?Blood pressure. ?Lipid and cholesterol levels. These may be checked every 5 years, or more frequently if you are over 31 years old. ?Skin check. ?Lung cancer screening. You may have this screening every year starting at age 56 if you have a 30-pack-year history of smoking and currently smoke or have quit within the past 15 years. ?Fecal occult blood test (FOBT) of the stool. You may have this test every year starting at age 54. ?Flexible sigmoidoscopy or colonoscopy. You may have a sigmoidoscopy every 5 years or a colonoscopy every 10 years starting at age 62. ?Hepatitis C blood test. ?Hepatitis B blood test. ?Sexually transmitted disease (STD) testing. ?Diabetes screening. This is done by checking your blood sugar (glucose) after you have not eaten for a while (fasting). You may have this done every 1-3 years. ?Bone density scan. This is done to screen for osteoporosis. You may have this done starting at age 53. ?Mammogram. This may be done every 1-2 years. Talk to your health care provider about how often you should have regular mammograms. ?Talk with your health care provider about your test results, treatment options, and if necessary, the need for more tests. ?Vaccines  ?Your health care provider may recommend certain vaccines, such as: ?Influenza vaccine. This is recommended every year. ?Tetanus, diphtheria, and acellular pertussis (Tdap, Td) vaccine. You may need a Td booster every 10 years. ?Zoster vaccine. You may need this after age 60. ?Pneumococcal 13-valent conjugate (PCV13) vaccine. One dose is  recommended after age 80. ?Pneumococcal  polysaccharide (PPSV23) vaccine. One dose is recommended after age 81. ?Talk to your health care provider about which screenings and vaccines you need and how often you need them. ?This information is not intended to replace advice given to you by your health care provider. Make sure you discuss any questions you have with your health care provider. ?Document Released: 01/06/2016 Document Revised: 08/29/2016 Document Reviewed: 10/11/2015 ?Elsevier Interactive Patient Education ? 2017 [place]. ? ?Fall Prevention in the Home ?Falls can cause injuries. They can happen to people of all ages. There are many things you can do to make your home safe and to help prevent falls. ?What can I do on the outside of my home? ?Regularly fix the edges of walkways and driveways and fix any cracks. ?Remove anything that might make you trip as you walk through a door, such as a raised step or threshold. ?Trim any bushes or trees on the path to your home. ?Use bright outdoor lighting. ?Clear any walking paths of anything that might make someone trip, such as rocks or tools. ?Regularly check to see if handrails are loose or broken. Make sure that both sides of any steps have handrails. ?Any raised decks and porches should have guardrails on the edges. ?Have any leaves, snow, or ice cleared regularly. ?Use sand or salt on walking paths during winter. ?Clean up any spills in your garage right away. This includes oil or grease spills. ?What can I do in the bathroom? ?Use night lights. ?Install grab bars by the toilet and in the tub and shower. Do not use towel bars as grab bars. ?Use non-skid mats or decals in the tub or shower. ?If you need to sit down in the shower, use a plastic, non-slip stool. ?Keep the floor dry. Clean up any water that spills on the floor as soon as it happens. ?Remove soap buildup in the tub or shower regularly. ?Attach bath mats securely with double-sided non-slip rug tape. ?Do not have throw rugs and other  things on the floor that can make you trip. ?What can I do in the bedroom? ?Use night lights. ?Make sure that you have a light by your bed that is easy to reach. ?Do not use any sheets or blankets that are too big for your bed. They should not hang down onto the floor. ?Have a firm chair that has side arms. You can use this for support while you get dressed. ?Do not have throw rugs and other things on the floor that can make you trip. ?What can I do in the kitchen? ?Clean up any spills right away. ?Avoid walking on wet floors. ?Keep items that you use a lot in easy-to-reach places. ?If you need to reach something above you, use a strong step stool that has a grab bar. ?Keep electrical cords out of the way. ?Do not use floor polish or wax that makes floors slippery. If you must use wax, use non-skid floor wax. ?Do not have throw rugs and other things on the floor that can make you trip. ?What can I do with my stairs? ?Do not leave any items on the stairs. ?Make sure that there are handrails on both sides of the stairs and use them. Fix handrails that are broken or loose. Make sure that handrails are as long as the stairways. ?Check any carpeting to make sure that it is firmly attached to the stairs. Fix any carpet that is loose or worn. ?Avoid having  throw rugs at the top or bottom of the stairs. If you do have throw rugs, attach them to the floor with carpet tape. ?Make sure that you have a light switch at the top of the stairs and the bottom of the stairs. If you do not have them, ask someone to add them for you. ?What else can I do to help prevent falls? ?Wear shoes that: ?Do not have high heels. ?Have rubber bottoms. ?Are comfortable and fit you well. ?Are closed at the toe. Do not wear sandals. ?If you use a stepladder: ?Make sure that it is fully opened. Do not climb a closed stepladder. ?Make sure that both sides of the stepladder are locked into place. ?Ask someone to hold it for you, if possible. ?Clearly  mark and make sure that you can see: ?Any grab bars or handrails. ?First and last steps. ?Where the edge of each step is. ?Use tools that help you move around (mobility aids) if they are needed. These include: ?Canes

## 2022-05-07 NOTE — Progress Notes (Signed)
? ?Subjective:  ? [name] is a 76 y.o. female who presents for Medicare Annual (Subsequent) preventive examination. ? ?I connected with [name] today by telephone and verified that I am speaking with the correct person using two identifiers. ?Location patient: home ?Location provider: work ?Persons participating in the virtual visit: patient, nurse.  ?  ?I discussed the limitations, risks, security and privacy concerns of performing an evaluation and management service by telephone and the availability of in person appointments. I also discussed with the patient that there may be a patient responsible charge related to this service. The patient expressed understanding and verbally consented to this telephonic visit.  ?  ?Interactive audio and video telecommunications were attempted between this provider and patient, however failed, due to patient having technical difficulties OR patient did not have access to video capability.  We continued and completed visit with audio only. ? ?Some vital signs may be absent or patient reported.  ? ?Time Spent with patient on telephone encounter: 25 minutes ? ? ?Review of Systems    ? ?Cardiac Risk Factors include: advanced age (>39mn, >>38women);hypertension;dyslipidemia ? ?   ?Objective:  ?  ?Today's Vitals  ? 05/07/22 1132 05/07/22 1133  ?Weight: 192 lb (87.1 kg)   ?Height: '5\' 5"'$  (1.651 m)   ?PainSc:  7   ? ?Body mass index is 31.95 kg/m?. ? ? ?  05/07/2022  ? 11:35 AM 03/17/2022  ?  5:37 PM 07/01/2020  ?  9:47 AM 02/25/2019  ? 10:42 PM 01/08/2019  ?  1:19 PM 12/09/2018  ?  9:45 AM 11/17/2018  ?  4:34 PM  ?Advanced Directives  ?Does Patient Have a Medical Advance Directive? No No No No No No No  ?Would patient like information on creating a medical advance directive?  No - Patient declined No - Patient declined  No - Patient declined No - Patient declined No - Patient declined  ? ? ?Current Medications (verified) ?Outpatient Encounter Medications as of 05/07/2022  ?Medication  Sig  ? amLODipine (NORVASC) 10 MG tablet Take 1 tablet (10 mg total) by mouth daily.  ? aspirin EC 81 MG tablet Take 81 mg by mouth daily.  ? B Complex-C (B-COMPLEX WITH VITAMIN C) tablet Take 1 tablet by mouth daily.  ? bisoprolol (ZEBETA) 5 MG tablet TAKE 1 TABLET BY MOUTH  DAILY  ? calcitRIOL (ROCALTROL) 0.25 MCG capsule Take 3 capsules (0.75 mcg total) by mouth daily.  ? Cholecalciferol (VITAMIN D-3) 25 MCG (1000 UT) CAPS Take 3000-4000 units by mouth daily  ? clopidogrel (PLAVIX) 75 MG tablet Take 1 tablet (75 mg total) by mouth daily.  ? furosemide (LASIX) 20 MG tablet TAKE 1 TABLET BY MOUTH ONCE WEEKLY AS NEEDED  ? isosorbide mononitrate (IMDUR) 60 MG 24 hr tablet Take 1 tablet (60 mg total) by mouth daily.  ? levothyroxine (SYNTHROID) 112 MCG tablet Take 1 tablet (112 mcg total) by mouth daily before breakfast.  ? Multiple Vitamin (MULTIVITAMIN WITH MINERALS) TABS tablet Take 1 tablet by mouth daily.  ? nitroGLYCERIN (NITROSTAT) 0.4 MG SL tablet DISSOLVE 1 TABLET UNDER THE TONGUE EVERY 5 MINUTES AS  NEEDED FOR CHEST PAIN. MAX  OF 3 TABLETS IN 15 MINUTES. CALL 911 IF PAIN PERSISTS.  ? pantoprazole (PROTONIX) 40 MG tablet TAKE 1 TABLET BY MOUTH  DAILY  ? REPATHA SURECLICK 1161MG/ML SOAJ INJECT '140MG'$  SUBCUTANEOUSLY EVERY 2 WEEKS  ? rosuvastatin (CRESTOR) 40 MG tablet TAKE 1 TABLET BY MOUTH  DAILY  ? ?No  facility-administered encounter medications on file as of 05/07/2022.  ? ? ?Allergies (verified) ?Zetia [ezetimibe]  ? ?History: ?Past Medical History:  ?Diagnosis Date  ? Acute MI, subendocardial ([place])   ? Arthritis   ? Back pain 05/31/2013  ? Benign paroxysmal positional vertigo 08/05/2016  ? CAD (coronary artery disease)   ? a. NSTEMI 10/2011: [place] 11/19/11: pLAD 30%, oOM 60%, AVCFX 30%, CFX after OM2 30%, pRCA 60 and 70%, then 99%, AM 80-90% with TIMI 3 flow.  PCI: Promus DES x 2 to RCA; b. 06/2012 Cath: patent RCA stents w/ subtl occl of Acute Marginal (jailed)->Med rx; c. 05/2015 MV: EF 59%, mod mid  infsept/inf/ap lat/ap infarct with peri-infarct isch-->Med Rx; d. 02/2016 Cath: LM nl, LAD 54m RI 50, RCA patent stents.  ? Colitis   ? Facial skin lesion 01/28/2017  ? GERD (gastroesophageal reflux disease)   ? occasional  ? Hyperlipidemia   ? Hypertension   ? Hypertensive heart disease   ? a. Echocardiogram 11/19/11: Difficult acoustic windows, EF 60-65%, normal LV wall thickness, grade 1 diastolic dysfunction  ? Hypoparathyroidism ([facility]   ? Low zinc level 11/26/2016  ? Multinodular thyroid   ? Goiter s/p thyroidectomy in 2007 with post-op  hypocalcemia and post-op hypothyroidism/hypoparathyroidism with hypocalcemia  ? Neck pain on right side 07/13/2013  ? Nocturia 05/31/2013  ? Osteoarthritis   ? Pain in right axilla 03/13/2016  ? Palpitations   ? a. 03/2012 - patient set up for event monitor but did not wear correctly -  she declined wearing a repeat monitor  ? Post-surgical hypothyroidism   ? Sun-damaged skin 12/05/2014  ? Tubular adenoma of colon 06/2011  ? Unspecified constipation 05/31/2013  ? Vertigo   ? Zinc deficiency 11/26/2015  ? ?Past Surgical History:  ?Procedure Laterality Date  ? CARDIAC CATHETERIZATION N/A 03/08/2016  ? Procedure: Left Heart Cath and Coronary Angiography;  Surgeon: JJettie Booze MD;  [facility];  Service: Cardiovascular;  Laterality: N/A;  ? COLONOSCOPY  Aenomatous polyps  ? 07/05/2011  ? COLONOSCOPY N/A 09/28/2014  ? Procedure: COLONOSCOPY;  Surgeon: MLadene Artist MD;  [facility];  Service: Endoscopy;  Laterality: N/A;  ? CORONARY ANGIOPLASTY WITH STENT PLACEMENT    ? LEFT HEART CATH AND CORONARY ANGIOGRAPHY N/A 12/09/2018  ? Procedure: LEFT HEART CATH AND CORONARY ANGIOGRAPHY;  Surgeon: VJettie Booze MD;  [facility];  Service: Cardiovascular;  Laterality: N/A;  ? LEFT HEART CATHETERIZATION WITH CORONARY ANGIOGRAM N/A 07/17/2012  ? Procedure: LEFT HEART CATHETERIZATION WITH CORONARY ANGIOGRAM;  Surgeon: THillary Bow MD;   [facility];  Service: Cardiovascular;  Laterality: N/A;  ? PERCUTANEOUS CORONARY STENT INTERVENTION (PCI-S) N/A 11/19/2011  ? Procedure: PERCUTANEOUS CORONARY STENT INTERVENTION (PCI-S);  Surgeon: THillary Bow MD;  [facility];  Service: Cardiovascular;  Laterality: N/A;  ? POLYPECTOMY    ? SHOULDER ARTHROSCOPY W/ ROTATOR CUFF REPAIR    ? right  ? TOTAL THYROIDECTOMY  2007  ? GOITER  ? ?Family History  ?Problem Relation Age of Onset  ? Colon cancer Brother   ? Cancer Brother   ?     COLON  ? Hypertension Father   ? Heart disease Father   ? Heart attack Father   ? Blindness Sister   ? Congestive Heart Failure Sister   ? Hypertension Sister   ? Healthy Daughter   ? Healthy Son   ? Thyroid disease Sister   ? Cancer Brother   ?  multiple myelomas  ? Healthy Daughter   ? Healthy Daughter   ? Diabetes Neg Hx   ? Prostate cancer Neg Hx   ? Breast cancer Neg Hx   ? Esophageal cancer Neg Hx   ? Rectal cancer Neg Hx   ? Stomach cancer Neg Hx   ? ?Social History  ? ?Socioeconomic History  ? Marital status: Married  ?  Spouse name: Not on file  ? Number of children: 4  ? Years of education: 49  ? Highest education level: Not on file  ?Occupational History  ? Occupation: Press photographer person at [employer]  ?Tobacco Use  ? Smoking status: Never  ? Smokeless tobacco: Never  ?Vaping Use  ? Vaping Use: Never used  ?Substance and Sexual Activity  ? Alcohol use: Not Currently  ? Drug use: No  ? Sexual activity: Not Currently  ?Other Topics Concern  ? Not on file  ?Social History Narrative  ? Lives at home alone.  Her son lives near her.  ? Right-handed.  ? 1 cup coffee per day.  ? ?Social Determinants of Health  ? ?Financial Resource Strain: Low Risk   ? Difficulty of Paying Living Expenses: Not hard at all  ?Food Insecurity: No Food Insecurity  ? Worried About Charity fundraiser in the Last Year: Never true  ? Ran Out of Food in the Last Year: Never true  ?Transportation Needs: Unmet Transportation Needs  ? Lack of  Transportation (Medical): Yes  ? Lack of Transportation (Non-Medical): No  ?Physical Activity: Sufficiently Active  ? Days of Exercise per Week: 7 days  ? Minutes of Exercise per Session: 30 min  ?Stress: No Stress Con

## 2022-05-10 ENCOUNTER — Telehealth: Payer: Self-pay

## 2022-05-10 NOTE — Progress Notes (Signed)
Cardiology Office Note   Date:  05/11/2022   ID:  [name], DOB 08/13/1946, MRN [id number]  PCP:  Mosie Lukes, MD  Cardiologist:   Dorris Carnes, MD   F/U of CAD    History of Present Illness:  [name] is a 76 y.o. female with CAD - s/p NSTEMI 10/2011 with DESx2   Other issues include thyroidectomy, GERD, HTN, HLD, vertigo.  Last myovue showed inferolateral scar with minimal periinfarct ischemia   L heart cath in Dec 2019 showed:  LAD with 25%  OM  with 100% with Left to R  collaterals  RCA with patent stent   Normal LVEF    Echo in early Sept 2020   LVEF and RVEF were normal   Mild diastolic dysfunction   I saw the pt in Sept 2022    She was seen by Gerrianne Scale in March and in April    [facility] for back suregery   Since seen the pt denies CP .  Her breathing is OK    No abdominal pain     The pt says her BP has been low at times  In early March the pt's husband (Tawfiq) died at home   He was 75   Current Meds  Medication Sig   aspirin EC 81 MG tablet Take 81 mg by mouth daily.   B Complex-C (B-COMPLEX WITH VITAMIN C) tablet Take 1 tablet by mouth daily.   bisoprolol (ZEBETA) 5 MG tablet TAKE 1 TABLET BY MOUTH  DAILY   calcitRIOL (ROCALTROL) 0.25 MCG capsule Take 3 capsules (0.75 mcg total) by mouth daily.   Cholecalciferol (VITAMIN D-3) 25 MCG (1000 UT) CAPS Take 3000-4000 units by mouth daily   clopidogrel (PLAVIX) 75 MG tablet Take 1 tablet (75 mg total) by mouth daily.   furosemide (LASIX) 20 MG tablet TAKE 1 TABLET BY MOUTH ONCE WEEKLY AS NEEDED   isosorbide mononitrate (IMDUR) 60 MG 24 hr tablet Take 1 tablet (60 mg total) by mouth daily.   levothyroxine (SYNTHROID) 112 MCG tablet Take 1 tablet (112 mcg total) by mouth daily before breakfast.   Multiple Vitamin (MULTIVITAMIN WITH MINERALS) TABS tablet Take 1 tablet by mouth daily.   nitroGLYCERIN (NITROSTAT) 0.4 MG SL tablet DISSOLVE 1 TABLET UNDER THE TONGUE EVERY 5 MINUTES AS  NEEDED FOR CHEST PAIN. MAX  OF 3  TABLETS IN 15 MINUTES. CALL 911 IF PAIN PERSISTS.   pantoprazole (PROTONIX) 40 MG tablet TAKE 1 TABLET BY MOUTH  DAILY   REPATHA SURECLICK 465 MG/ML SOAJ INJECT '140MG'$  SUBCUTANEOUSLY EVERY 2 WEEKS   rosuvastatin (CRESTOR) 40 MG tablet TAKE 1 TABLET BY MOUTH  DAILY   [DISCONTINUED] amLODipine (NORVASC) 10 MG tablet Take 1 tablet (10 mg total) by mouth daily.     Allergies:   Zetia [ezetimibe]   Past Medical History:  Diagnosis Date   Acute MI, subendocardial ([place])    Arthritis    Back pain 05/31/2013   Benign paroxysmal positional vertigo 08/05/2016   CAD (coronary artery disease)    a. NSTEMI 10/2011: [place] 11/19/11: pLAD 30%, oOM 60%, AVCFX 30%, CFX after OM2 30%, pRCA 60 and 70%, then 99%, AM 80-90% with TIMI 3 flow.  PCI: Promus DES x 2 to RCA; b. 06/2012 Cath: patent RCA stents w/ subtl occl of Acute Marginal (jailed)->Med rx; c. 05/2015 MV: EF 59%, mod mid infsept/inf/ap lat/ap infarct with peri-infarct isch-->Med Rx; d. 02/2016 Cath: LM nl, LAD 39m RI 50, RCA patent stents.  Colitis    Facial skin lesion 01/28/2017   GERD (gastroesophageal reflux disease)    occasional   Hyperlipidemia    Hypertension    Hypertensive heart disease    a. Echocardiogram 11/19/11: Difficult acoustic windows, EF 60-65%, normal LV wall thickness, grade 1 diastolic dysfunction   Hypoparathyroidism ([facility])    Low zinc level 11/26/2016   Multinodular thyroid    Goiter s/p thyroidectomy in 2007 with post-op  hypocalcemia and post-op hypothyroidism/hypoparathyroidism with hypocalcemia   Neck pain on right side 07/13/2013   Nocturia 05/31/2013   Osteoarthritis    Pain in right axilla 03/13/2016   Palpitations    a. 03/2012 - patient set up for event monitor but did not wear correctly -  she declined wearing a repeat monitor   Post-surgical hypothyroidism    Rice-damaged skin 12/05/2014   Tubular adenoma of colon 06/2011   Unspecified constipation 05/31/2013   Vertigo    Zinc deficiency 11/26/2015    Past Surgical  History:  Procedure Laterality Date   CARDIAC CATHETERIZATION N/A 03/08/2016   Procedure: Left Heart Cath and Coronary Angiography;  Surgeon: Jettie Booze, MD;  Location: [place] [facility];  Service: Cardiovascular;  Laterality: N/A;   COLONOSCOPY  Aenomatous polyps   07/05/2011   COLONOSCOPY N/A 09/28/2014   Procedure: COLONOSCOPY;  Surgeon: Ladene Artist, MD;  [facility];  Service: Endoscopy;  Laterality: N/A;   CORONARY ANGIOPLASTY WITH STENT PLACEMENT     LEFT HEART CATH AND CORONARY ANGIOGRAPHY N/A 12/09/2018   Procedure: LEFT HEART CATH AND CORONARY ANGIOGRAPHY;  Surgeon: Jettie Booze, MD;  Location: [place] [facility];  Service: Cardiovascular;  Laterality: N/A;   LEFT HEART CATHETERIZATION WITH CORONARY ANGIOGRAM N/A 07/17/2012   Procedure: LEFT HEART CATHETERIZATION WITH CORONARY ANGIOGRAM;  Surgeon: Hillary Bow, MD;  [facility];  Service: Cardiovascular;  Laterality: N/A;   PERCUTANEOUS CORONARY STENT INTERVENTION (PCI-S) N/A 11/19/2011   Procedure: PERCUTANEOUS CORONARY STENT INTERVENTION (PCI-S);  Surgeon: Hillary Bow, MD;  Location: [facility];  Service: Cardiovascular;  Laterality: N/A;   POLYPECTOMY     SHOULDER ARTHROSCOPY W/ ROTATOR CUFF REPAIR     right   TOTAL THYROIDECTOMY  2007   GOITER     Social History:  The patient  reports that she has never smoked. She has never used smokeless tobacco. She reports that she does not currently use alcohol. She reports that she does not use drugs.   Family History:  The patient's family history includes Blindness in her sister; Cancer in her brother and brother; Colon cancer in her brother; Congestive Heart Failure in her sister; Healthy in her daughter, daughter, daughter, and son; Heart attack in her father; Heart disease in her father; Hypertension in her father and sister; Thyroid disease in her sister.    ROS:  Please see the history of present illness. All other systems are  reviewed and  Negative to the above problem except as noted.    PHYSICAL EXAM: VS:  BP 118/60   Pulse (!) 59   Ht '5\' 5"'$  (1.651 m)   Wt 191 lb 6.4 oz (86.8 kg)   SpO2 97%   BMI 31.85 kg/m   GEN: Well nourished, well developed, in no acute distress  HEENT: normal  Neck: JVP is normal  No carotid bruits Cardiac: RRR; no murmurs,   No LE  edema  Respiratory:  clear to auscultation bilaterally GI: soft, nontender, nondistended, + BS  No hepatomegaly  MS: no deformity Moving all extremities   Skin: warm and dry Neuro:  Strength and sensation are intact Psych: euthymic mood, full affect   EKG:  EKG is ordered today.  SB 58 bpm   RBBB   Occaoinal PVC  Echo   9.2020  1. The left ventricle has normal systolic function, with an ejection  fraction of 55-60%. The cavity size was normal. Left ventricular diastolic  Doppler parameters are consistent with impaired relaxation. No evidence of  left ventricular regional wall  motion abnormalities.   2. The right ventricle has normal systolic function. The cavity was  normal. There is no increase in right ventricular wall thickness.   3. The mitral valve is grossly normal.   4. The aortic valve is tricuspid. Mild sclerosis of the aortic valve.  Aortic valve regurgitation is mild by color flow Doppler.   5. The aorta is normal unless otherwise noted.   6. The aortic root and ascending aorta are normal in size and structure.   Lipid Panel    Component Value Date/Time   CHOL 195 01/01/2022 0832   TRIG 234 (H) 01/01/2022 0832   HDL 46 01/01/2022 0832   CHOLHDL 4.2 01/01/2022 0832   CHOLHDL 3 12/28/2020 1020   VLDL 40.2 (H) 12/28/2020 1020   LDLCALC 109 (H) 01/01/2022 0832   LDLDIRECT 84.0 12/28/2020 1020      Wt Readings from Last 3 Encounters:  05/11/22 191 lb 6.4 oz (86.8 kg)  05/07/22 192 lb (87.1 kg)  04/17/22 192 lb (87.1 kg)      ASSESSMENT AND PLAN:  1  CAD  Pt without symptoms of angina   Follow    2   HTN   BP is a  little low at times   Will cut back on amlodipine to 5 mg   Follow    3  HL   HL   Keep on [id number]   Check labst today  Check BMET, CBC, A1C     Current medicines are reviewed at length with the patient today.  The patient does not have concerns regarding medicines.  Signed, Dorris Carnes, MD  05/11/2022 10:28 PM    [address], [place], [place]  [address] Phone: [phone]; Fax: [phone]

## 2022-05-10 NOTE — Telephone Encounter (Signed)
   Telephone encounter was:  Successful.  05/10/2022 Name: [name] MRN: [id number] DOB: 1946-08-09  [name] is a 76 y.o. year old adult who is a primary care patient of Mosie Lukes, MD . The community resource team was consulted for assistance with Transportation Needs   Care guide performed the following interventions: Patient provided with information about care guide support team and interviewed to confirm resource needs.Patient stated she just needed the number to call to set up her transportation. I gave her information over the phone.  Follow Up Plan:  No further follow up planned at this time. The patient has been provided with needed resources.    [facility] [facility], Care Management  [phone] [address], [place], [place] [address] Phone: [phone] Email: [email or web address]

## 2022-05-11 ENCOUNTER — Ambulatory Visit (INDEPENDENT_AMBULATORY_CARE_PROVIDER_SITE_OTHER): Payer: Medicare Other | Admitting: Internal Medicine

## 2022-05-11 ENCOUNTER — Encounter: Payer: Self-pay | Admitting: Internal Medicine

## 2022-05-11 VITALS — BP 118/60 | HR 59 | Ht 65.0 in | Wt 191.4 lb

## 2022-05-11 DIAGNOSIS — I10 Essential (primary) hypertension: Secondary | ICD-10-CM | POA: Diagnosis not present

## 2022-05-11 DIAGNOSIS — Z79899 Other long term (current) drug therapy: Secondary | ICD-10-CM

## 2022-05-11 DIAGNOSIS — E039 Hypothyroidism, unspecified: Secondary | ICD-10-CM | POA: Diagnosis not present

## 2022-05-11 DIAGNOSIS — I2510 Atherosclerotic heart disease of native coronary artery without angina pectoris: Secondary | ICD-10-CM

## 2022-05-11 MED ORDER — AMLODIPINE BESYLATE 5 MG PO TABS: 5.0000 mg | ORAL_TABLET | Freq: Every day | ORAL | 3 refills | Status: DC

## 2022-05-11 NOTE — Patient Instructions (Signed)
Medication Instructions:  Decrease Amlodipine to 5 mg daily  *If you need a refill on your cardiac medications before your next appointment, please call your pharmacy*   Lab Work: NMR, CBC, HGBA1C, BMET  If you have labs (blood work) drawn today and your tests are completely normal, you will receive your results only by: [place] (if you have MyChart) OR A paper copy in the mail If you have any lab test that is abnormal or we need to change your treatment, we will call you to review the results.   Testing/Procedures:    Follow-Up: At [facility], you and your health needs are our priority.  As part of our continuing mission to provide you with exceptional heart care, we have created designated Provider Care Teams.  These Care Teams include your primary Cardiologist (physician) and Advanced Practice Providers (APPs -  Physician Assistants and Nurse Practitioners) who all work together to provide you with the care you need, when you need it.  We recommend signing up for the patient portal called "MyChart".  Sign up information is provided on this After Visit Summary.  MyChart is used to connect with patients for Virtual Visits (Telemedicine).  Patients are able to view lab/test results, encounter notes, upcoming appointments, etc.  Non-urgent messages can be sent to your provider as well.   To learn more about what you can do with MyChart, go to [email or web address].    Your next appointment:   5 month(s)  The format for your next appointment:   In Person  Provider:   Dorris Carnes, MD     Other Instructions   Important Information About Sugar

## 2022-05-14 LAB — BASIC METABOLIC PANEL
BUN/Creatinine Ratio: 24 (ref 12–28)
BUN: 21 mg/dL (ref 8–27)
CO2: 23 mmol/L (ref 20–29)
Calcium: 8.8 mg/dL (ref 8.7–10.3)
Chloride: 101 mmol/L (ref 96–106)
Creatinine, Ser: 0.89 mg/dL (ref 0.57–1.00)
Glucose: 89 mg/dL (ref 70–99)
Potassium: 4.5 mmol/L (ref 3.5–5.2)
Sodium: 145 mmol/L — ABNORMAL HIGH (ref 134–144)
eGFR: 68 mL/min/{1.73_m2} (ref >59)

## 2022-05-14 LAB — CBC
Hematocrit: 41.3 % (ref 34.0–46.6)
Hemoglobin: 14.1 g/dL (ref 11.1–15.9)
MCH: 29 pg (ref 26.6–33.0)
MCHC: 34.1 g/dL (ref 31.5–35.7)
MCV: 85 fL (ref 79–97)
Platelets: 301 10*3/uL (ref 150–450)
RBC: 4.86 x10E6/uL (ref 3.77–5.28)
RDW: 13.6 % (ref 11.7–15.4)
WBC: 10.4 10*3/uL (ref 3.4–10.8)

## 2022-05-14 LAB — NMR, LIPOPROFILE
Cholesterol, Total: 151 mg/dL (ref 100–199)
HDL Particle Number: 33.6 umol/L (ref >=30.5)
HDL-C: 50 mg/dL (ref >39)
LDL Particle Number: 939 nmol/L (ref <1000)
LDL Size: 20.3 nm — ABNORMAL LOW (ref >20.5)
LDL-C (NIH Calc): 66 mg/dL (ref 0–99)
LP-IR Score: 66 — ABNORMAL HIGH (ref <=45)
Small LDL Particle Number: 433 nmol/L (ref <=527)
Triglycerides: 214 mg/dL — ABNORMAL HIGH (ref 0–149)

## 2022-05-14 LAB — HEMOGLOBIN A1C
Est. average glucose Bld gHb Est-mCnc: 114 mg/dL
Hgb A1c MFr Bld: 5.6 % (ref 4.8–5.6)

## 2022-05-15 ENCOUNTER — Telehealth: Payer: Self-pay | Admitting: Internal Medicine

## 2022-05-15 ENCOUNTER — Telehealth: Payer: Self-pay

## 2022-05-15 NOTE — Telephone Encounter (Signed)
   Pre-operative Risk Assessment    Patient Name: [name]  DOB: 1946-10-30 MRN: [id number]     Request for Surgical Clearance    Procedure:  Dental Extraction - Amount of Teeth to be Pulled:  1 tooth to be extracted if Root Canal does not work. Also, wants clearance for cleaning and fillings.  Date of Surgery:  Clearance TBD                                 Surgeon:  Dr. Dominga Ferry Surgeon's Group or Practice Name:  [facility] Phone number:  [phone] Fax number:  [phone]   Type of Clearance Requested:   - Medical  - Pharmacy:  Hold Aspirin and Clopidogrel (Plavix)     Type of Anesthesia:  Local    Additional requests/questions:  Does this patient need antibiotics? Please advise surgeon/provider what medications should be held. Please fax a copy of [phone] to the surgeon's office.  Signed, Michaelyn Barter   05/15/2022, 3:53 PM

## 2022-05-15 NOTE — Telephone Encounter (Signed)
   Primary Cardiologist: Dorris Carnes, MD  Chart reviewed as part of pre-operative protocol coverage. Simple dental extractions are considered low risk procedures per guidelines and generally do not require any specific cardiac clearance. It is also generally accepted that for simple extractions and dental cleanings, there is no need to interrupt blood thinner therapy.   SBE prophylaxis is not required for the patient.  I will route this recommendation to the requesting party via Epic fax function and remove from pre-op pool.  Please call with questions.  Emmaline Life, NP-C    05/15/2022, 4:56 PM [place] [address], [address] Office [phone] Fax [phone]

## 2022-05-15 NOTE — Telephone Encounter (Signed)
Lab results mailed to the pt per her request.

## 2022-05-15 NOTE — Telephone Encounter (Signed)
Pt states that she would like a copy of her test results mailed to her. Please advise

## 2022-06-04 ENCOUNTER — Ambulatory Visit (INDEPENDENT_AMBULATORY_CARE_PROVIDER_SITE_OTHER): Payer: Medicare Other | Admitting: Family Medicine

## 2022-06-04 ENCOUNTER — Encounter: Payer: Self-pay | Admitting: Family Medicine

## 2022-06-04 VITALS — BP 121/59 | HR 57 | Resp 20 | Ht 65.0 in | Wt 196.0 lb

## 2022-06-04 DIAGNOSIS — S40862A Insect bite (nonvenomous) of left upper arm, initial encounter: Secondary | ICD-10-CM | POA: Diagnosis not present

## 2022-06-04 DIAGNOSIS — W57XXXA Bitten or stung by nonvenomous insect and other nonvenomous arthropods, initial encounter: Secondary | ICD-10-CM

## 2022-06-04 NOTE — Progress Notes (Signed)
   Acute Office Visit  Subjective:     Patient ID: [name], female    DOB: July 22, 1946, 76 y.o.   MRN: [id number]  Chief Complaint  Patient presents with   Insect Bite    Tick bite, 2 weeks ago    HPI Patient is in today for tick bite.  Patient reports that 2 weeks ago, she was sitting at her friend's house and noticed a new black spot to left upper arm where she did not remember having a mole before. She had friend look at it and they told her it was a tick and were able to easily remove the whole insect with a tissue. She doesn't think it was fully attached or engorged. Thinks it was there for an hour or so at most, definitely not there the day prior. She never had a rash and denies any fevers, chills, body aches, joint pains, fatigue, etc.   She wasn't worried about it, but someone scared her and told her to come in for Lyme disease testing.    ROS All review of systems negative except what is listed in the HPI       Objective:    BP (!) 121/59 (BP Location: Left Arm, Patient Position: Sitting, Cuff Size: Normal)   Pulse (!) 57   Resp 20   Ht '5\' 5"'$  (1.651 m)   Wt 196 lb (88.9 kg)   SpO2 97%   BMI 32.62 kg/m    Physical Exam Vitals reviewed.  Constitutional:      Appearance: Normal appearance.  Cardiovascular:     Rate and Rhythm: Normal rate and regular rhythm.  Pulmonary:     Effort: Pulmonary effort is normal.     Breath sounds: Normal breath sounds.  Skin:    General: Skin is warm.     Findings: No rash.     Comments: Small bug bite visible/palpable to left upper arm, no surrounding rash, erythema, edema, induration, etc.  Neurological:     General: No focal deficit present.     Mental Status: She is alert and oriented to person, place, and time. Mental status is at baseline.  Psychiatric:        Mood and Affect: Mood normal.        Behavior: Behavior normal.        Thought Content: Thought content normal.        Judgment: Judgment normal.      No results found for any visits on 06/04/22.      Assessment & Plan:   Problem List Items Addressed This Visit   None Visit Diagnoses     Tick bite of left upper arm, initial encounter      No additional symptoms or findings on exam. Discussed with patient typical warning signs and reasons for testing along with general timeline for testing. She is agreeable to defer workup at this time given it has only been 2 weeks and she has no symptoms. Patient aware of signs/symptoms requiring further/urgent evaluation.        Return if symptoms worsen or fail to improve.  Terrilyn Saver, NP

## 2022-06-08 ENCOUNTER — Telehealth: Payer: Self-pay | Admitting: Internal Medicine

## 2022-06-10 ENCOUNTER — Other Ambulatory Visit: Payer: Self-pay | Admitting: Family Medicine

## 2022-06-10 DIAGNOSIS — E890 Postprocedural hypothyroidism: Secondary | ICD-10-CM

## 2022-06-22 DIAGNOSIS — M5412 Radiculopathy, cervical region: Secondary | ICD-10-CM | POA: Diagnosis not present

## 2022-06-22 DIAGNOSIS — M5416 Radiculopathy, lumbar region: Secondary | ICD-10-CM | POA: Diagnosis not present

## 2022-06-22 DIAGNOSIS — M5030 Other cervical disc degeneration, unspecified cervical region: Secondary | ICD-10-CM | POA: Diagnosis not present

## 2022-06-25 DIAGNOSIS — I872 Venous insufficiency (chronic) (peripheral): Secondary | ICD-10-CM | POA: Diagnosis not present

## 2022-06-25 DIAGNOSIS — M7989 Other specified soft tissue disorders: Secondary | ICD-10-CM | POA: Diagnosis not present

## 2022-06-25 DIAGNOSIS — I83813 Varicose veins of bilateral lower extremities with pain: Secondary | ICD-10-CM | POA: Diagnosis not present

## 2022-06-27 ENCOUNTER — Telehealth: Payer: Self-pay | Admitting: *Deleted

## 2022-06-27 NOTE — Telephone Encounter (Signed)
   Patient Name: [name]  DOB: 09-02-1946 MRN: [id number]  Primary Cardiologist: Dorris Carnes, MD  Chart reviewed as part of pre-operative protocol coverage. Given past medical history and time since last visit, based on ACC/AHA guidelines, [name] would be at acceptable risk for the planned procedure without further cardiovascular testing.   Per office protocol, patient may hold Plavix for 5 days prior to procedure.  Please resume Plavix as soon as possible postprocedure, at the discretion of the surgeon.  It is recommended if the patient continue aspirin throughout the perioperative period.  However, if aspirin needs to be held, please contact our office for further recommendations.  The patient was advised that if she develops new symptoms prior to surgery to contact our office to arrange for a follow-up visit, and she verbalized understanding.  I will route this recommendation to the requesting party via Epic fax function and remove from pre-op pool.  Please call with questions.  Lenna Sciara, NP 06/27/2022, 10:46 AM

## 2022-06-27 NOTE — Telephone Encounter (Signed)
   Pre-operative Risk Assessment    Patient Name: [name]  DOB: [birth year] MRN: [id number]      Request for Surgical Clearance    Procedure:   CERVICAL SNRB  INJECTION  Date of Surgery:  Clearance 07/21/22                                 Surgeon:   Surgeon's Group or Practice Name:  [facility] Phone number:  [phone] Fax number:  [phone]   Type of Clearance Requested:   - Pharmacy:  Hold Clopidogrel (Plavix) X'S 7 DAYS   Type of Anesthesia:  Not Indicated   Additional requests/questions:    Astrid Divine   06/27/2022, 9:48 AM

## 2022-07-01 ENCOUNTER — Other Ambulatory Visit: Payer: Self-pay | Admitting: Internal Medicine

## 2022-07-07 ENCOUNTER — Other Ambulatory Visit: Payer: Self-pay | Admitting: Family Medicine

## 2022-07-07 ENCOUNTER — Other Ambulatory Visit: Payer: Self-pay | Admitting: Internal Medicine

## 2022-07-07 DIAGNOSIS — E039 Hypothyroidism, unspecified: Secondary | ICD-10-CM

## 2022-07-07 DIAGNOSIS — I10 Essential (primary) hypertension: Secondary | ICD-10-CM

## 2022-07-07 DIAGNOSIS — E876 Hypokalemia: Secondary | ICD-10-CM

## 2022-07-07 DIAGNOSIS — K2100 Gastro-esophageal reflux disease with esophagitis, without bleeding: Secondary | ICD-10-CM

## 2022-07-08 NOTE — Telephone Encounter (Signed)
Error

## 2022-07-15 ENCOUNTER — Emergency Department (HOSPITAL_BASED_OUTPATIENT_CLINIC_OR_DEPARTMENT_OTHER)
Admission: EM | Admit: 2022-07-15 | Discharge: 2022-07-15 | Disposition: A | Discharge status: Home / Self Care | Payer: Medicare Other | Attending: Emergency Medicine | Admitting: Emergency Medicine

## 2022-07-15 ENCOUNTER — Emergency Department (HOSPITAL_BASED_OUTPATIENT_CLINIC_OR_DEPARTMENT_OTHER): Payer: Medicare Other

## 2022-07-15 ENCOUNTER — Other Ambulatory Visit: Payer: Self-pay

## 2022-07-15 ENCOUNTER — Encounter (HOSPITAL_BASED_OUTPATIENT_CLINIC_OR_DEPARTMENT_OTHER): Payer: Self-pay | Admitting: Emergency Medicine

## 2022-07-15 DIAGNOSIS — R079 Chest pain, unspecified: Secondary | ICD-10-CM | POA: Insufficient documentation

## 2022-07-15 DIAGNOSIS — R110 Nausea: Secondary | ICD-10-CM | POA: Diagnosis not present

## 2022-07-15 DIAGNOSIS — M549 Dorsalgia, unspecified: Secondary | ICD-10-CM | POA: Insufficient documentation

## 2022-07-15 DIAGNOSIS — R0789 Other chest pain: Secondary | ICD-10-CM | POA: Diagnosis not present

## 2022-07-15 DIAGNOSIS — I2510 Atherosclerotic heart disease of native coronary artery without angina pectoris: Secondary | ICD-10-CM | POA: Insufficient documentation

## 2022-07-15 LAB — CBC WITH DIFFERENTIAL/PLATELET
Abs Immature Granulocytes: 0.04 10*3/uL (ref 0.00–0.07)
Basophils Absolute: 0.1 10*3/uL (ref 0.0–0.1)
Basophils Relative: 1 %
Eosinophils Absolute: 0.3 10*3/uL (ref 0.0–0.5)
Eosinophils Relative: 4 %
HCT: 41.1 % (ref 36.0–46.0)
Hemoglobin: 13.6 g/dL (ref 12.0–15.0)
Immature Granulocytes: 1 %
Lymphocytes Relative: 33 %
Lymphs Abs: 2.5 10*3/uL (ref 0.7–4.0)
MCH: 28.8 pg (ref 26.0–34.0)
MCHC: 33.1 g/dL (ref 30.0–36.0)
MCV: 86.9 fL (ref 80.0–100.0)
Monocytes Absolute: 0.6 10*3/uL (ref 0.1–1.0)
Monocytes Relative: 8 %
Neutro Abs: 4.1 10*3/uL (ref 1.7–7.7)
Neutrophils Relative %: 53 %
Platelets: 307 10*3/uL (ref 150–400)
RBC: 4.73 MIL/uL (ref 3.87–5.11)
RDW: 13.7 % (ref 11.5–15.5)
WBC: 7.6 10*3/uL (ref 4.0–10.5)
nRBC: 0 % (ref 0.0–0.2)

## 2022-07-15 LAB — LIPASE, BLOOD: Lipase: 36 U/L (ref 11–51)

## 2022-07-15 LAB — COMPREHENSIVE METABOLIC PANEL
ALT: 13 U/L (ref 0–44)
AST: 17 U/L (ref 15–41)
Albumin: 3.6 g/dL (ref 3.5–5.0)
Alkaline Phosphatase: 72 U/L (ref 38–126)
Anion gap: 10 (ref 5–15)
BUN: 23 mg/dL (ref 8–23)
CO2: 27 mmol/L (ref 22–32)
Calcium: 8.4 mg/dL — ABNORMAL LOW (ref 8.9–10.3)
Chloride: 105 mmol/L (ref 98–111)
Creatinine, Ser: 1.04 mg/dL — ABNORMAL HIGH (ref 0.44–1.00)
GFR, Estimated: 56 mL/min — ABNORMAL LOW (ref >60)
Glucose, Bld: 115 mg/dL — ABNORMAL HIGH (ref 70–99)
Potassium: 3.7 mmol/L (ref 3.5–5.1)
Sodium: 142 mmol/L (ref 135–145)
Total Bilirubin: 0.6 mg/dL (ref 0.3–1.2)
Total Protein: 6.8 g/dL (ref 6.5–8.1)

## 2022-07-15 LAB — TROPONIN I (HIGH SENSITIVITY)
Troponin I (High Sensitivity): 5 ng/L (ref <18)
Troponin I (High Sensitivity): 5 ng/L (ref <18)

## 2022-07-15 MED ORDER — TIZANIDINE HCL 2 MG PO TABS: 2.0000 mg | ORAL_TABLET | ORAL | 0 refills | Status: DC | PRN

## 2022-07-15 MED ORDER — ACETAMINOPHEN 325 MG PO TABS: 650.0000 mg | ORAL_TABLET | Freq: Once | ORAL | Status: DC

## 2022-07-15 MED ORDER — ASPIRIN 81 MG PO CHEW
324.0000 mg | CHEWABLE_TABLET | Freq: Once | ORAL | Status: AC
  Administered 2022-07-15: 324 mg via ORAL
  Filled 2022-07-15: qty 4

## 2022-07-15 NOTE — ED Notes (Signed)
Provider is aware of patients heartrate

## 2022-07-15 NOTE — ED Provider Notes (Signed)
[place] EMERGENCY DEPARTMENT Provider Note   CSN: [id number] Arrival date & time: 07/15/22  0859     History  Chief Complaint  Patient presents with   Chest Pain   Shortness of Breath    [name] is a 76 y.o. female.  HPI 76 year old female with a history of hyperlipidemia, GERD, coronary artery disease, NSTEMI in 2012 with DES x2 on Plavix, thyroidectomy, vertigo presents to the ER with complaints of chest pain and back pain.  Patient reports gradual onset of pain over the last several days, mostly starting in the back.  She now states that the pain is now becoming more in her front and feels like "pain in her heart".  The pain is exacerbated with turning and moving.  She does admit to doing some heavy lifting and gardening over the last several days.  She denies any shortness of breath.  No leg swelling.  Denies any dizziness or near syncope.  No cough, fevers, chills.  She does endorse nausea but no vomiting.  She states that she does have a history of GERD but this feels different.  She has not taken anything for her pain.    Home Medications Prior to Admission medications   Medication Sig Start Date End Date Taking? Authorizing Provider  tiZANidine (ZANAFLEX) 2 MG tablet Take 1 tablet (2 mg total) by mouth as needed for muscle spasms (Take at night as needed). 07/15/22 08/14/22 Yes Garald Balding, PA-C  amLODipine (NORVASC) 5 MG tablet Take 1 tablet (5 mg total) by mouth daily. 05/11/22   Fay Records, MD  aspirin EC 81 MG tablet Take 81 mg by mouth daily. 11/22/11   Dunn, Dayna N, PA-C  B Complex-C (B-COMPLEX WITH VITAMIN C) tablet Take 1 tablet by mouth daily.    [provider]  bisoprolol (ZEBETA) 5 MG tablet TAKE 1 TABLET BY MOUTH DAILY 07/02/22   Fay Records, MD  calcitRIOL (ROCALTROL) 0.25 MCG capsule Take 3 capsules (0.75 mcg total) by mouth daily. 04/17/22   Imogene Burn, PA-C  Cholecalciferol (VITAMIN D-3) 25 MCG (1000 UT) CAPS Take  3000-4000 units by mouth daily 03/16/20   Fay Records, MD  clopidogrel (PLAVIX) 75 MG tablet Take 1 tablet (75 mg total) by mouth daily. 02/21/22   Terrilyn Saver, NP  furosemide (LASIX) 20 MG tablet TAKE 1 TABLET BY MOUTH ONCE WEEKLY AS NEEDED 01/24/22   Fay Records, MD  isosorbide mononitrate (IMDUR) 60 MG 24 hr tablet Take 1 tablet (60 mg total) by mouth daily. 04/17/22   Imogene Burn, PA-C  levothyroxine (SYNTHROID) 112 MCG tablet TAKE 1 TABLET BY MOUTH DAILY  BEFORE BREAKFAST 06/12/22   Mosie Lukes, MD  Multiple Vitamin (MULTIVITAMIN WITH MINERALS) TABS tablet Take 1 tablet by mouth daily.    [provider]  nitroGLYCERIN (NITROSTAT) 0.4 MG SL tablet DISSOLVE 1 TABLET UNDER THE TONGUE EVERY 5 MINUTES AS  NEEDED FOR CHEST PAIN. MAX  OF 3 TABLETS IN 15 MINUTES. CALL 911 IF PAIN PERSISTS. 02/21/22   Caleen Jobs B, NP  pantoprazole (PROTONIX) 40 MG tablet TAKE 1 TABLET BY MOUTH  DAILY 07/09/22   Mosie Lukes, MD  REPATHA SURECLICK 660 MG/ML SOAJ INJECT '140MG'$  SUBCUTANEOUSLY EVERY 2 WEEKS 04/20/22   Fay Records, MD  rosuvastatin (CRESTOR) 40 MG tablet TAKE 1 TABLET BY MOUTH DAILY 07/09/22   Fay Records, MD      Allergies    Zetia [  ezetimibe]    Review of Systems   Review of Systems Ten systems reviewed and are negative for acute change, except as noted in the HPI.   Physical Exam Updated Vital Signs BP (!) 151/78   Pulse (!) 47   Temp 97.6 F (36.4 C) (Oral)   Resp 17   Ht '5\' 5"'$  (1.651 m)   Wt 89.2 kg   SpO2 97%   BMI 32.73 kg/m  Physical Exam Vitals and nursing note reviewed.  Constitutional:      General: She is not in acute distress.    Appearance: She is well-developed.  HENT:     Head: Normocephalic and atraumatic.  Eyes:     Conjunctiva/sclera: Conjunctivae normal.  Cardiovascular:     Rate and Rhythm: Normal rate and regular rhythm.     Heart sounds: No murmur heard. Pulmonary:     Effort: Pulmonary effort is normal. No respiratory distress.      Breath sounds: Normal breath sounds.  Chest:     Comments: No chest wall tenderness Abdominal:     Palpations: Abdomen is soft.     Tenderness: There is no abdominal tenderness.  Musculoskeletal:        General: No swelling. Normal range of motion.     Cervical back: Neck supple.     Comments: No paraspinal muscle tenderness  Skin:    General: Skin is warm and dry.     Capillary Refill: Capillary refill takes less than 2 seconds.  Neurological:     Mental Status: She is alert.  Psychiatric:        Mood and Affect: Mood normal.     ED Results / Procedures / Treatments   Labs (all labs ordered are listed, but only abnormal results are displayed) Labs Reviewed  COMPREHENSIVE METABOLIC PANEL - Abnormal; Notable for the following components:      Result Value   Glucose, Bld 115 (*)    Creatinine, Ser 1.04 (*)    Calcium 8.4 (*)    GFR, Estimated 56 (*)    All other components within normal limits  CBC WITH DIFFERENTIAL/PLATELET  LIPASE, BLOOD  TROPONIN I (HIGH SENSITIVITY)  TROPONIN I (HIGH SENSITIVITY)    EKG EKG Interpretation  Date/Time:  Sunday July 15 2022 09:07:23 EDT Ventricular Rate:  57 PR Interval:  195 QRS Duration: 148 QT Interval:  483 QTC Calculation: 471 R Axis:   55 Text Interpretation: Sinus rhythm Right bundle branch block No significant change since last tracing Confirmed by Deno Etienne [phone]) on 07/15/2022 9:10:06 AM  Radiology DG Chest Portable 1 View  Result Date: 07/15/2022 CLINICAL DATA:  Chest pain EXAM: PORTABLE CHEST 1 VIEW COMPARISON:  03/17/2022 FINDINGS: Normal heart size and mediastinal contours. Postoperative thoracic inlet. No acute infiltrate or edema. No effusion or pneumothorax. No acute osseous findings. IMPRESSION: No evidence of active disease. Electronically Signed   By: Jorje Guild M.D.   On: 07/15/2022 09:31    Procedures Procedures    Medications Ordered in ED Medications  aspirin chewable tablet 324 mg (324 mg Oral  Given 07/15/22 1009)    ED Course/ Medical Decision Making/ A&P                           Medical Decision Making Amount and/or Complexity of Data Reviewed Labs: ordered. Radiology: ordered.  Risk OTC drugs.   76 year old female presenting with chest pain and back pain.  On arrival, she is bradycardic  with a heart rate in the mid 50s, however per chart review this appears to be her baseline.  She is slightly hypertensive with blood pressure 171/60.  She has not tachypneic on my exam, lung sounds are clear.  No lower extremity edema.  No reproducible musculoskeletal chest wall tenderness.  Differential diagnosis includes ACS, PE, dissection, musculoskeletal pain, GERD, unstable/stable angina, anxiety, pericardial effusion  Labs ordered, reviewed and interpreted by me. CBC without leukocytosis, normal hemoglobin CMP with mildly elevated creatinine 1.04, but not consistent with AKI, normal electrolytes, normal LFTs, normal lipase Delta troponin negative  Imaging ordered, reviewed, agree with radiology read. Chest x-ray without any acute abnormalities  I ordered, and interpreted her EKG, consistent with normal sinus rhythm  Patient was given 324 mg aspirin for cardiac indication/back pain  Considered admission however overall, work-up is reassuring.  No indication for admission at this time.  Low suspicion for ACS, dissection, PE.  Suspect likely musculoskeletal in nature, however recommended follow-up with her cardiologist.  Patient requesting pain medications, encouraged her to continue with Tylenol, will add Zanaflex.  She is followed by spine specialist and has a pending injection.  We discussed return precautions.  She was understanding and is agreeable.  Stable for discharge  This was a shared visit with my supervising physician Dr. Tyrone Nine who independently saw and evaluated the patient & provided guidance in evaluation/management/disposition ,in agreement with care   Final Clinical  Impression(s) / ED Diagnoses Final diagnoses:  Chest pain, unspecified type    Rx / DC Orders ED Discharge Orders          Ordered    tiZANidine (ZANAFLEX) 2 MG tablet  As needed        07/15/22 1148              Garald Balding, PA-C 07/15/22 [place], DO 07/15/22 1208

## 2022-07-15 NOTE — ED Triage Notes (Signed)
Patient states that chest pain started around 2am.Patient states chest pain is in the center and rotates to back

## 2022-07-15 NOTE — Discharge Instructions (Signed)
You were evaluated in the Emergency Department and after careful evaluation, we did not find any emergent condition requiring admission or further testing in the hospital.  Your work-up today was overall reassuring.  As discussed, I suspect that your pain may be due to back pain.  Please take the muscle relaxer at night do not drink or drive on the medication.  Please be very cautious to prevent falls.  Please make sure to follow-up with your spine specialist.  I also recommend following up with your cardiologist about your symptoms.  Please make sure to return to the ER if you have any new or worsening symptoms.   Thank you for allowing [place] to be a part of your care.

## 2022-07-16 DIAGNOSIS — M545 Low back pain, unspecified: Secondary | ICD-10-CM | POA: Diagnosis not present

## 2022-07-17 DIAGNOSIS — M545 Low back pain, unspecified: Secondary | ICD-10-CM | POA: Diagnosis not present

## 2022-07-24 ENCOUNTER — Emergency Department (HOSPITAL_COMMUNITY): Payer: Medicare Other

## 2022-07-24 ENCOUNTER — Other Ambulatory Visit: Payer: Self-pay

## 2022-07-24 ENCOUNTER — Emergency Department (HOSPITAL_COMMUNITY)
Admission: EM | Admit: 2022-07-24 | Discharge: 2022-07-24 | Disposition: A | Discharge status: Home / Self Care | Payer: Medicare Other | Attending: Emergency Medicine | Admitting: Emergency Medicine

## 2022-07-24 DIAGNOSIS — M549 Dorsalgia, unspecified: Secondary | ICD-10-CM | POA: Insufficient documentation

## 2022-07-24 DIAGNOSIS — I10 Essential (primary) hypertension: Secondary | ICD-10-CM | POA: Insufficient documentation

## 2022-07-24 DIAGNOSIS — Z7982 Long term (current) use of aspirin: Secondary | ICD-10-CM | POA: Insufficient documentation

## 2022-07-24 DIAGNOSIS — M5116 Intervertebral disc disorders with radiculopathy, lumbar region: Secondary | ICD-10-CM | POA: Diagnosis not present

## 2022-07-24 DIAGNOSIS — Z79899 Other long term (current) drug therapy: Secondary | ICD-10-CM | POA: Diagnosis not present

## 2022-07-24 DIAGNOSIS — Z7901 Long term (current) use of anticoagulants: Secondary | ICD-10-CM | POA: Diagnosis not present

## 2022-07-24 DIAGNOSIS — M5126 Other intervertebral disc displacement, lumbar region: Secondary | ICD-10-CM | POA: Diagnosis not present

## 2022-07-24 DIAGNOSIS — R6889 Other general symptoms and signs: Secondary | ICD-10-CM | POA: Diagnosis not present

## 2022-07-24 DIAGNOSIS — R200 Anesthesia of skin: Secondary | ICD-10-CM | POA: Insufficient documentation

## 2022-07-24 DIAGNOSIS — M5416 Radiculopathy, lumbar region: Secondary | ICD-10-CM

## 2022-07-24 DIAGNOSIS — Z743 Need for continuous supervision: Secondary | ICD-10-CM | POA: Diagnosis not present

## 2022-07-24 LAB — COMPREHENSIVE METABOLIC PANEL
ALT: 15 U/L (ref 0–44)
AST: 11 U/L — ABNORMAL LOW (ref 15–41)
Albumin: 3.1 g/dL — ABNORMAL LOW (ref 3.5–5.0)
Alkaline Phosphatase: 63 U/L (ref 38–126)
Anion gap: 8 (ref 5–15)
BUN: 22 mg/dL (ref 8–23)
CO2: 25 mmol/L (ref 22–32)
Calcium: 7.9 mg/dL — ABNORMAL LOW (ref 8.9–10.3)
Chloride: 106 mmol/L (ref 98–111)
Creatinine, Ser: 1.06 mg/dL — ABNORMAL HIGH (ref 0.44–1.00)
GFR, Estimated: 55 mL/min — ABNORMAL LOW (ref >60)
Glucose, Bld: 98 mg/dL (ref 70–99)
Potassium: 3.4 mmol/L — ABNORMAL LOW (ref 3.5–5.1)
Sodium: 139 mmol/L (ref 135–145)
Total Bilirubin: 0.8 mg/dL (ref 0.3–1.2)
Total Protein: 5.9 g/dL — ABNORMAL LOW (ref 6.5–8.1)

## 2022-07-24 LAB — URINALYSIS, ROUTINE W REFLEX MICROSCOPIC
Bilirubin Urine: NEGATIVE
Glucose, UA: NEGATIVE mg/dL
Hgb urine dipstick: NEGATIVE
Ketones, ur: NEGATIVE mg/dL
Nitrite: NEGATIVE
Protein, ur: NEGATIVE mg/dL
Specific Gravity, Urine: 1.008 (ref 1.005–1.030)
pH: 7 (ref 5.0–8.0)

## 2022-07-24 LAB — CBC WITH DIFFERENTIAL/PLATELET
Abs Immature Granulocytes: 0.13 10*3/uL — ABNORMAL HIGH (ref 0.00–0.07)
Basophils Absolute: 0.1 10*3/uL (ref 0.0–0.1)
Basophils Relative: 1 %
Eosinophils Absolute: 0.2 10*3/uL (ref 0.0–0.5)
Eosinophils Relative: 2 %
HCT: 42.7 % (ref 36.0–46.0)
Hemoglobin: 14.3 g/dL (ref 12.0–15.0)
Immature Granulocytes: 1 %
Lymphocytes Relative: 27 %
Lymphs Abs: 3.3 10*3/uL (ref 0.7–4.0)
MCH: 29.1 pg (ref 26.0–34.0)
MCHC: 33.5 g/dL (ref 30.0–36.0)
MCV: 86.8 fL (ref 80.0–100.0)
Monocytes Absolute: 0.9 10*3/uL (ref 0.1–1.0)
Monocytes Relative: 7 %
Neutro Abs: 7.4 10*3/uL (ref 1.7–7.7)
Neutrophils Relative %: 62 %
Platelets: 312 10*3/uL (ref 150–400)
RBC: 4.92 MIL/uL (ref 3.87–5.11)
RDW: 14.1 % (ref 11.5–15.5)
WBC: 11.9 10*3/uL — ABNORMAL HIGH (ref 4.0–10.5)
nRBC: 0 % (ref 0.0–0.2)

## 2022-07-24 LAB — MAGNESIUM: Magnesium: 1.9 mg/dL (ref 1.7–2.4)

## 2022-07-24 LAB — PHOSPHORUS: Phosphorus: 4.6 mg/dL (ref 2.5–4.6)

## 2022-07-24 LAB — TSH: TSH: 0.829 u[IU]/mL (ref 0.350–4.500)

## 2022-07-24 MED ORDER — OXYCODONE-ACETAMINOPHEN 5-325 MG PO TABS: 1.0000 | ORAL_TABLET | Freq: Once | ORAL | Status: DC

## 2022-07-24 MED ORDER — TIZANIDINE HCL 2 MG PO TABS: 2.0000 mg | ORAL_TABLET | ORAL | 0 refills | Status: AC | PRN

## 2022-07-24 MED ORDER — FENTANYL CITRATE PF 50 MCG/ML IJ SOSY
100.0000 ug | PREFILLED_SYRINGE | Freq: Once | INTRAMUSCULAR | Status: AC
  Administered 2022-07-24: 100 ug via INTRAVENOUS
  Filled 2022-07-24: qty 2

## 2022-07-24 MED ORDER — ONDANSETRON HCL 4 MG PO TABS: 4.0000 mg | ORAL_TABLET | Freq: Four times a day (QID) | ORAL | 0 refills | Status: DC

## 2022-07-24 MED ORDER — SODIUM CHLORIDE 0.9 % IV SOLN: Freq: Once | INTRAVENOUS | Status: AC

## 2022-07-24 MED ORDER — OXYCODONE HCL 5 MG PO TABS
5.0000 mg | ORAL_TABLET | Freq: Once | ORAL | Status: AC
  Administered 2022-07-24: 5 mg via ORAL
  Filled 2022-07-24: qty 1

## 2022-07-24 MED ORDER — OXYCODONE HCL 5 MG PO TABS: 5.0000 mg | ORAL_TABLET | Freq: Four times a day (QID) | ORAL | 0 refills | Status: DC | PRN

## 2022-07-24 MED ORDER — AMLODIPINE BESYLATE 5 MG PO TABS
5.0000 mg | ORAL_TABLET | Freq: Once | ORAL | Status: AC
  Administered 2022-07-24: 5 mg via ORAL
  Filled 2022-07-24: qty 1

## 2022-07-24 MED ORDER — METHYLPREDNISOLONE 4 MG PO TBPK: ORAL_TABLET | ORAL | 0 refills | Status: DC

## 2022-07-24 MED ORDER — LIDOCAINE 5 % EX PTCH
1.0000 | MEDICATED_PATCH | CUTANEOUS | Status: DC
  Administered 2022-07-24: 1 via TRANSDERMAL
  Filled 2022-07-24: qty 1

## 2022-07-24 MED ORDER — ACETAMINOPHEN 500 MG PO TABS
1000.0000 mg | ORAL_TABLET | Freq: Once | ORAL | Status: AC
  Administered 2022-07-24: 1000 mg via ORAL
  Filled 2022-07-24: qty 2

## 2022-07-24 MED ORDER — DIAZEPAM 2 MG PO TABS
2.0000 mg | ORAL_TABLET | Freq: Once | ORAL | Status: AC
  Administered 2022-07-24: 2 mg via ORAL
  Filled 2022-07-24: qty 1

## 2022-07-24 MED ORDER — MORPHINE SULFATE (PF) 4 MG/ML IV SOLN
4.0000 mg | Freq: Once | INTRAVENOUS | Status: AC
  Administered 2022-07-24: 4 mg via INTRAVENOUS
  Filled 2022-07-24: qty 1

## 2022-07-24 MED ORDER — CALCIUM GLUCONATE-NACL 1-0.675 GM/50ML-% IV SOLN
1.0000 g | Freq: Once | INTRAVENOUS | Status: AC
  Administered 2022-07-24: 1000 mg via INTRAVENOUS
  Filled 2022-07-24: qty 50

## 2022-07-24 NOTE — ED Notes (Signed)
MD at bedside. 

## 2022-07-24 NOTE — Care Management (Signed)
ED RNCM met with  patient and daughter at bedside to discuss recommendations for  [facility] services.  Patient and daughter are both agreeable to [facility] recommendation.  Patient has [facility], explained that referral will be sent to  [place] within health insurance network. RNCM will follow up tomorrow with patient  and [facility].

## 2022-07-24 NOTE — ED Triage Notes (Signed)
Pt BIB EMS due to numbness all over her body. Pt states back pain started yesterday. Pt is axox4. VSS.

## 2022-07-24 NOTE — ED Notes (Signed)
Pt transported to MRI 

## 2022-07-24 NOTE — ED Notes (Signed)
Pt ambulatory to restroom and back to bed with minimal assistance

## 2022-07-24 NOTE — ED Provider Notes (Signed)
Patient signed out to me with low back pain.  Awaiting MRI of her low back.  History of herniated disc in her low back.  Was recently on steroid course and tramadol for similar.  She has been referred per my review of outpatient notes to physical therapy.  He has some chronic neck issues as well which in the past has been recommended for surgery.  On my exam she appears to have normal strength and sensation in her lower legs.  She has no symptoms of cauda equina.  She is got good pulses in her lower legs.  She is feeling better after a couple of doses of IV narcotics.  She still seems that she may need some more support at home.  They do have a walker.  I think that will be reasonable for her.  We will try to see if I can help arrange for physical therapy as well.  I will write for Valium as she is claustrophobic for her to take before she gets MRI.  I have a pretty low suspicion for cauda equina.  Suspect that this is likely ongoing herniated disc/stenosis.  I have no concern for infectious process.  MRI shows no evidence of cauda equina.  Patient does have a mild herniated disc at L3-L4 with some narrowing of the left lateral recess.  Otherwise she also has unchanged disc herniation at L4-L5.  Suspect that she is having radicular pain from this.  She is feeling better after medications here.  She follows with spine team already and has appointment with them next week.  Will prescribe Medrol Dosepak, Roxicodone and refill her muscle relaxant.  She understands return precautions.  Discharged in good condition.  I have ordered home PT for her as well.  She has a walker at home.  This chart was dictated using voice recognition software.  Despite best efforts to proofread,  errors can occur which can change the documentation meaning.    Lennice Sites, DO 07/24/22 2245

## 2022-07-24 NOTE — Discharge Instructions (Addendum)
Recommend 1000 mg of Tylenol every 6 hours as needed for pain.  Please discontinue your tramadol.  I prescribed your roxicodone every 6 hours.  This medication is a narcotic.  Do not mix with alcohol or drugs or dangerous activities.  I have also refilled your muscle relaxant.  I have also started you on steroids.  Follow-up with your primary care doctor and your spine doctor.

## 2022-07-24 NOTE — ED Notes (Signed)
Patient verbalizes understanding of discharge instructions. Opportunity for questioning and answers were provided. Armband removed by staff, pt discharged from ED via wheelchair.  

## 2022-07-24 NOTE — ED Notes (Signed)
MRI called - said it would be an hour or so before they could get pt over - Family and pt notified - MD aware

## 2022-07-24 NOTE — ED Provider Notes (Signed)
[place] EMERGENCY DEPARTMENT Provider Note   CSN: [id number] Arrival date & time: 07/24/22  1127     History  Chief Complaint  Patient presents with   Numbness    [name] is a 76 y.o. female with a past medical history of hypertension who presents to the clinic with concerns of tingling sensations to her arms and leg.  She also reports having back pain.  She notes that this back pain started about a week ago, and is still giving her pains.  She is unable to describe the pain to me.  She states that this back pain started last week, and yesterday she developed this tingling.  She states that she was given tramadol, but does not help her.  She denies any fever, chills, night sweats, or weight loss.  She denies any bladder or bowel incontinence.  She denies any headache.  She denies any vision changes.  Patient informs me that she thinks that she has a low calcium.  HPI     Home Medications Prior to Admission medications   Medication Sig Start Date End Date Taking? Authorizing Provider  amLODipine (NORVASC) 5 MG tablet Take 1 tablet (5 mg total) by mouth daily. 05/11/22   Fay Records, MD  aspirin EC 81 MG tablet Take 81 mg by mouth daily. 11/22/11   Dunn, Dayna N, PA-C  B Complex-C (B-COMPLEX WITH VITAMIN C) tablet Take 1 tablet by mouth daily.    [provider]  bisoprolol (ZEBETA) 5 MG tablet TAKE 1 TABLET BY MOUTH DAILY 07/02/22   Fay Records, MD  calcitRIOL (ROCALTROL) 0.25 MCG capsule Take 3 capsules (0.75 mcg total) by mouth daily. 04/17/22   Imogene Burn, PA-C  Cholecalciferol (VITAMIN D-3) 25 MCG (1000 UT) CAPS Take 3000-4000 units by mouth daily 03/16/20   Fay Records, MD  clopidogrel (PLAVIX) 75 MG tablet Take 1 tablet (75 mg total) by mouth daily. 02/21/22   Terrilyn Saver, NP  furosemide (LASIX) 20 MG tablet TAKE 1 TABLET BY MOUTH ONCE WEEKLY AS NEEDED 01/24/22   Fay Records, MD  isosorbide mononitrate (IMDUR) 60 MG 24 hr tablet  Take 1 tablet (60 mg total) by mouth daily. 04/17/22   Imogene Burn, PA-C  levothyroxine (SYNTHROID) 112 MCG tablet TAKE 1 TABLET BY MOUTH DAILY  BEFORE BREAKFAST 06/12/22   Mosie Lukes, MD  Multiple Vitamin (MULTIVITAMIN WITH MINERALS) TABS tablet Take 1 tablet by mouth daily.    [provider]  nitroGLYCERIN (NITROSTAT) 0.4 MG SL tablet DISSOLVE 1 TABLET UNDER THE TONGUE EVERY 5 MINUTES AS  NEEDED FOR CHEST PAIN. MAX  OF 3 TABLETS IN 15 MINUTES. CALL 911 IF PAIN PERSISTS. 02/21/22   Caleen Jobs B, NP  pantoprazole (PROTONIX) 40 MG tablet TAKE 1 TABLET BY MOUTH  DAILY 07/09/22   Mosie Lukes, MD  REPATHA SURECLICK 433 MG/ML SOAJ INJECT '140MG'$  SUBCUTANEOUSLY EVERY 2 WEEKS 04/20/22   Fay Records, MD  rosuvastatin (CRESTOR) 40 MG tablet TAKE 1 TABLET BY MOUTH DAILY 07/09/22   Fay Records, MD  tiZANidine (ZANAFLEX) 2 MG tablet Take 1 tablet (2 mg total) by mouth as needed for muscle spasms (Take at night as needed). 07/15/22 08/14/22  Garald Balding, PA-C      Allergies    Zetia [ezetimibe]    Review of Systems   Review of Systems  Constitutional:  Negative for chills and fever.  Eyes:  Negative for visual  disturbance.  Respiratory:  Negative for shortness of breath.   Cardiovascular:  Negative for chest pain.  Neurological:        Patient reports tingling to arms and legs.  Patient denies any bladder or bowel incontinence    Physical Exam Updated Vital Signs BP (!) 152/69   Pulse (!) 48   Temp (!) 97.5 F (36.4 C)   Resp 20   Ht '5\' 5"'$  (1.651 m)   Wt 86.2 kg   SpO2 98%   BMI 31.62 kg/m  Physical Exam  General: Alert and orientated x3. Patient looks uncomfortable in bed, rubbing her feet together Eyes: Pupils equal and reactive to light, EOM intact  Head: Normocephalic, atraumatic  Cardio: Regular rate and rhythm, no murmurs, rubs or gallops. 2+ pulses to bilateral upper and lower extremities  Pulmonary: Clear to ausculation bilaterally with no rales, rhonchi,  and crackles  Neuro: CN II-XII intact. Sensation intact to upper and lower extremities. 2+ patellar reflex.  Back: No midline tenderness, no step off or deformities noted. No paraspinal muscle tenderness.  MSK: 5/5 strength to upper and lower extremities.    ED Results / Procedures / Treatments   Labs (all labs ordered are listed, but only abnormal results are displayed) Labs Reviewed  CBC WITH DIFFERENTIAL/PLATELET - Abnormal; Notable for the following components:      Result Value   WBC 11.9 (*)    Abs Immature Granulocytes 0.13 (*)    All other components within normal limits  COMPREHENSIVE METABOLIC PANEL - Abnormal; Notable for the following components:   Potassium 3.4 (*)    Creatinine, Ser 1.06 (*)    Calcium 7.9 (*)    Total Protein 5.9 (*)    Albumin 3.1 (*)    AST 11 (*)    GFR, Estimated 55 (*)    All other components within normal limits  TSH  MAGNESIUM  PHOSPHORUS  URINALYSIS, ROUTINE W REFLEX MICROSCOPIC  LYME DISEASE SEROLOGY W/REFLEX    EKG EKG Interpretation  Date/Time:  Tuesday July 24 2022 11:35:45 EDT Ventricular Rate:  56 PR Interval:  186 QRS Duration: 132 QT Interval:  471 QTC Calculation: 455 R Axis:   68 Text Interpretation: Sinus rhythm Ventricular premature complex Right bundle branch block Similar to July 2023 tracing Confirmed by Nanda Quinton [phone]) on 07/24/2022 11:38:40 AM  Radiology No results found.  Procedures Procedures    Medications Ordered in ED Medications  calcium gluconate 1 g/ 50 mL sodium chloride IVPB (1,000 mg Intravenous New Bag/Given 07/24/22 1513)  diazepam (VALIUM) tablet 2 mg (has no administration in time range)  fentaNYL (SUBLIMAZE) injection 100 mcg (100 mcg Intravenous Given 07/24/22 1342)    ED Course/ Medical Decision Making/ A&P Clinical Course as of 07/24/22 1552  Tue Jul 24, 2022  1406 Phosphorus [AP]    Clinical Course User Index [AP] Leigh Aurora, DO                           Medical Decision  Making This is a 76 year old female who presents to the emergency department with concerns of tingling to her arms and legs.  Patient was recently seen in the emergency department 8 days ago, for chest pain and back pain with negative ACS work-up.  Patient reports that this tingling started yesterday.  Given patient's past medical history of hypoparathyroidism, this could be electrolyte imbalance.  Given this new onset tingling, I will obtain CBC, CMP, magnesium, phosphorus, TSH,  and a UA.  Amount and/or Complexity of Data Reviewed External Data Reviewed: notes.    Details: It is noted that the patient follows orthopedics outpatient.  She gets epidural injections from orthopedics.  Patient was also seen in the ED 8 days ago for work-up for chest pain and back pain.  The work up for the most part was negative, and thought to be musculoskeletal in nature.  Patient was discharged on Zanaflex. Labs: ordered. Decision-making details documented in ED Course.    Details: CBC, CMP, magnesium, phosphorus, TSH, UA pending, Lyme disease serology pending Radiology: ordered. ECG/medicine tests: ordered.    Details: ECG per my read at a rate of 54.  Sinus rhythm.  Normal axis.  PVC noted.  No signs of ST segment elevation.  Risk Prescription drug management. Risk Details: Patient being treated with fentanyl for pain     1335: CBC showing elevated white count of 11.9.  This is likely from her steroid use.  Otherwise CBC normal.  No signs of infection or anemia  1401: Reassessed patient at this time after receiving fentanyl. Upon walking in the room, patient is much more comfortable in bed and sleeping peacefully.   1430: TSH normal, CBC within normal limits, CMP showing decreased calcium of 7.9 with Gfr of 55. Potassium slightly low at 3.4. Mag 1.9. Phos 4.6. Plan moving forward would be to replete calcium. Given that the numbness and tingling is occurring and this is the 2nd visit in the past 2 weeks and  given the history of orthopedics epidurals, will obtain MRI of lumbar spine.   Care will be transferred to oncoming physician. At this time patient will be getting calcium supplement. She will also get lumbar spine MRI. If everything comes back normal, patient can be started back on the gabapentin that she was taking and can follow up up with orthopedics.         Final Clinical Impression(s) / ED Diagnoses Final diagnoses:  None    Rx / DC Orders ED Discharge Orders     None         Leigh Aurora, DO 07/24/22 1552    Margette Fast, MD 07/25/22 [phone]

## 2022-07-25 LAB — LYME DISEASE SEROLOGY W/REFLEX: Lyme Total Antibody EIA: NEGATIVE

## 2022-07-27 ENCOUNTER — Encounter: Payer: Self-pay | Admitting: Family Medicine

## 2022-07-27 ENCOUNTER — Ambulatory Visit (INDEPENDENT_AMBULATORY_CARE_PROVIDER_SITE_OTHER): Payer: Medicare Other | Admitting: Family Medicine

## 2022-07-27 VITALS — BP 132/58 | HR 59 | Ht 65.0 in | Wt 191.6 lb

## 2022-07-27 DIAGNOSIS — M5136 Other intervertebral disc degeneration, lumbar region: Secondary | ICD-10-CM | POA: Diagnosis not present

## 2022-07-27 DIAGNOSIS — M51369 Other intervertebral disc degeneration, lumbar region without mention of lumbar back pain or lower extremity pain: Secondary | ICD-10-CM

## 2022-07-27 DIAGNOSIS — R269 Unspecified abnormalities of gait and mobility: Secondary | ICD-10-CM | POA: Diagnosis not present

## 2022-07-27 DIAGNOSIS — F419 Anxiety disorder, unspecified: Secondary | ICD-10-CM

## 2022-07-27 DIAGNOSIS — M5416 Radiculopathy, lumbar region: Secondary | ICD-10-CM | POA: Diagnosis not present

## 2022-07-27 NOTE — Patient Instructions (Signed)
Ordering home health aide in addition to physical therapy. They will be calling you once they get this set up with your insurance.  Referral for counseling/therapy placed.

## 2022-07-27 NOTE — Progress Notes (Signed)
Acute Office Visit  Subjective:     Patient ID: [name], female    DOB: Aug 23, 1946, 76 y.o.   MRN: [id number]  CC: ED f/u, back pain   HPI Patient is in today for ED follow-up for chronic back pain.  07/24/22 Patient went to the ED for back pain (hx of herniated disk) and arm/leg tingling. There were no red flags on exam.  Lumbar spine MRI revealed: 1. New mild left asymmetric disc bulge at L3-L4 with narrowing of the left lateral recess. Correlate for left L4 radiculopathy. 2. Unchanged small right subarticular disc protrusion at L4-L5 with mild right neural foraminal stenosis. Radicular pain was suspected. She was discharged after getting improvement from IV narcotics. They prescribed her steroids, zofran, oxycodone to go home with and mentioned they would try to order home health PT. She thought she already had a walker. She is already following with ortho for the back pain.  Today, daughter is present with her and speaking most of the visit as patient reports she is overwhelmed and uncomfortable.  They report that is still in a lot of pain, supposed to see ortho in the next few weeks. She is having some trouble getting around the house and doing ADLs. They are requesting that we order home health aid for a few hours per day in addition to PT. She is having trouble with: dressing, bathing, preparing meals. She is using a cane - states she used to have a walker, but has not been able to find it and is requesting new order for one. Daughter is here visiting, but lives in [place] and patient usually lives alone. They also report that she is having some depression/anxiety/grieving and would like a counselor to talk to. Denies any SI/HI or desires for medication at this time.      ROS All review of systems negative except what is listed in the HPI      Objective:    BP (!) 132/58   Pulse (!) 59   Ht '5\' 5"'$  (1.651 m)   Wt 191 lb 9.6 oz (86.9 kg)   BMI 31.88 kg/m    Physical  Exam Vitals reviewed.  Constitutional:      General: She is not in acute distress.    Appearance: Normal appearance. She is obese. She is not ill-appearing.  Cardiovascular:     Rate and Rhythm: Normal rate and regular rhythm.  Pulmonary:     Effort: Pulmonary effort is normal.     Breath sounds: Normal breath sounds.  Musculoskeletal:     Cervical back: Normal range of motion and neck supple.  Skin:    General: Skin is warm and dry.  Neurological:     General: No focal deficit present.     Mental Status: She is alert and oriented to person, place, and time.  Psychiatric:        Mood and Affect: Mood normal.        Behavior: Behavior normal.        Thought Content: Thought content normal.        Judgment: Judgment normal.     No results found for any visits on 07/27/22.      Assessment & Plan:   1. Anxiety Referral for counseling placed. No SI/HI.  - Ambulatory referral to [facility]  2. Right lumbar radiculopathy 3. Abnormality of gait 4. DDD (degenerative disc disease), lumbar Current baseline has declined d/t pain, mobility concerns. She is having trouble walking  extended distances (requiring frequent stops and assistive device - needs walker), performing ADLs such as dressing, bathing, preparing meals, etc. She lives alone and she is at high risk for falls. Would benefit from home health aide for ADL assistance at least a few hours per day and PT for strengthening/safety - orders/referral placed.  Continue following with ortho for interventions.  Patient aware of signs/symptoms requiring further/urgent evaluation.   - Ambulatory referral to [facility] - For home use only DME Walker rolling    No orders of the defined types were placed in this encounter.   Return if symptoms worsen or fail to improve.  Terrilyn Saver, NP

## 2022-07-30 ENCOUNTER — Telehealth: Payer: Self-pay | Admitting: Family Medicine

## 2022-07-30 DIAGNOSIS — N3000 Acute cystitis without hematuria: Secondary | ICD-10-CM

## 2022-07-30 MED ORDER — CEPHALEXIN 500 MG PO CAPS: 500.0000 mg | ORAL_CAPSULE | Freq: Two times a day (BID) | ORAL | 0 refills | Status: AC

## 2022-07-30 NOTE — Telephone Encounter (Signed)
Adonis Huguenin (daughter) called to check on status on antibiotic being sent. Advised that it had been routed as high priority to the provider and that it would be looked into. She stated that there was nothing to look into considering that her mother is in severe pain and Lovena Le "neglected" to look at the lab work for her urine. She expressed that she feels that we are not taking her mother's pain seriously and wants [place] to go ahead and send in the antibiotic.

## 2022-07-30 NOTE — Telephone Encounter (Signed)
Patient's daughter, Adonis Huguenin, called upset that the lab work was overlooked when her mom came in on Friday and the lab work indicates a severe urine infection. Daughter, Adonis Huguenin wishes to have an antibiotic called in for her mother , an ultrasound of her kidneys to make sure nothing more is going on and she wanted to follow up on the physical therapy referral that the hospital was supposed to put in as it has been over 24 hours and she has heard nothing. Adonis Huguenin said that if her mother did not get the prescription soon, she would start calling every hour until something was done. Requesting high priority.  She would like this to be high priority. Please call daughter to advise. Please send antibiotic to [facility] [address], [place], [place] [address] ; : [phone]

## 2022-07-30 NOTE — Telephone Encounter (Signed)
Spoke with pt's daughter.  Advised of rx and pending referrals.  Encouraged daughter to try to get pt to hydrate at home and try to get to the lab this week to recheck labs.  Pt's daughter voiced understanding.

## 2022-07-30 NOTE — Telephone Encounter (Signed)
Pt's daughter called back and forgot to mention pt's body/ face numbness has gotten much worse and wants a referral to neuro. Amber made aware and advised to transfer to triage for eval.

## 2022-07-31 ENCOUNTER — Other Ambulatory Visit: Payer: Self-pay | Admitting: *Deleted

## 2022-07-31 ENCOUNTER — Telehealth: Payer: Self-pay

## 2022-07-31 DIAGNOSIS — R200 Anesthesia of skin: Secondary | ICD-10-CM

## 2022-07-31 DIAGNOSIS — R202 Paresthesia of skin: Secondary | ICD-10-CM

## 2022-07-31 NOTE — Telephone Encounter (Signed)
Error

## 2022-07-31 NOTE — Telephone Encounter (Signed)
Spoke with Lovena Le and Dr. Charlett Blake this morning regarding the new symptom of facial tingling.  Both advised that pt needs to be evaluated in the ED. Was advised to place neuro referral in the meantime.  Called pt's daughter to give her all this information and she verbalized understanding.

## 2022-07-31 NOTE — Telephone Encounter (Signed)
From Triage nurse:  Daughter calling  Chief complaint: Numbness/ Tingling- sudden on one side of the body or face.  Initial Comment: Caller states her mother is having numbness in her face, numbness is on one side of her face.  Caller states mother having numbness on one side of her face, 2 weeks ago with back pain, to E., prescribed steroid.  Got worse, back to ER, MRI, slipped disc, refill of steroid.  Numbness not going away and spreading to face from leg and arm.  Feeling it more , everywhere.  Calling the office for Neurology referral.  Final Disposition: See PCP in days.

## 2022-07-31 NOTE — Telephone Encounter (Signed)
Patient's daughter called back to request her mom's referral to be changed to [facility] since they have sooner appointments. She would also like to request pain medication specifically for nerve pain for her mother. She would like a call back to know if the referral was changed and if the nerve medicine will be sent. Please advise.

## 2022-07-31 NOTE — Telephone Encounter (Signed)
Spoke with pt's daughter again.  Advised that no new medication will be prescribed for worsening symptoms that have no been worked up by a physician.  Strongly urged her to take pt to the ED again.  Daughter stated that she spoke to someone in PT and she was told that there were specific medications for nerve pain and that's what her mom needs since "now all of her pain is in her nerves".  Advised again that no new meds will be sent in and that the referral has been changed per her request.  She thanked me then hung up.

## 2022-07-31 NOTE — Telephone Encounter (Signed)
Pt's daughter states she is very frustrated with the way her mom is being taken care of. She stated she need an immediate referral to neurology and if that does not happen she is going to file medical negligence. Her mom woke up today and is not able to walk on her left leg. Wanted to ask if [facility] referral has called them but did not see daughter on dpr. She stated she would like the number to neurology as soon as she is referred so she can call them herself.

## 2022-07-31 NOTE — Telephone Encounter (Signed)
I spoke with the pt's son. She has been referred to [facility] neurology, but he/she would like to be seen here.  I advised a new referral would be need to our office for [place] to assist with this.

## 2022-08-01 ENCOUNTER — Telehealth: Payer: Self-pay | Admitting: *Deleted

## 2022-08-01 DIAGNOSIS — G8929 Other chronic pain: Secondary | ICD-10-CM | POA: Diagnosis not present

## 2022-08-01 DIAGNOSIS — I252 Old myocardial infarction: Secondary | ICD-10-CM | POA: Diagnosis not present

## 2022-08-01 DIAGNOSIS — I739 Peripheral vascular disease, unspecified: Secondary | ICD-10-CM | POA: Diagnosis not present

## 2022-08-01 DIAGNOSIS — M1990 Unspecified osteoarthritis, unspecified site: Secondary | ICD-10-CM | POA: Diagnosis not present

## 2022-08-01 DIAGNOSIS — M5116 Intervertebral disc disorders with radiculopathy, lumbar region: Secondary | ICD-10-CM | POA: Diagnosis not present

## 2022-08-01 DIAGNOSIS — Z7902 Long term (current) use of antithrombotics/antiplatelets: Secondary | ICD-10-CM | POA: Diagnosis not present

## 2022-08-01 DIAGNOSIS — I2510 Atherosclerotic heart disease of native coronary artery without angina pectoris: Secondary | ICD-10-CM | POA: Diagnosis not present

## 2022-08-01 DIAGNOSIS — I119 Hypertensive heart disease without heart failure: Secondary | ICD-10-CM | POA: Diagnosis not present

## 2022-08-01 DIAGNOSIS — E782 Mixed hyperlipidemia: Secondary | ICD-10-CM | POA: Diagnosis not present

## 2022-08-01 NOTE — Telephone Encounter (Signed)
Form has been faxed confirmation was received.  Referral coordinator notified.

## 2022-08-01 NOTE — Telephone Encounter (Signed)
Form printed and given to provider to fill out.  Spoke with [facility] care navigator nurse  with [facility] and she was asking what was time frame on [facility] referral.  I advised her that referral coordinator called this morning and stated that we need to fill out form and fax before [facility] can be sent to [facility].  Just waiting on provider to fill out to fax.  We will try and get it done today.

## 2022-08-07 DIAGNOSIS — I739 Peripheral vascular disease, unspecified: Secondary | ICD-10-CM | POA: Diagnosis not present

## 2022-08-07 DIAGNOSIS — M5116 Intervertebral disc disorders with radiculopathy, lumbar region: Secondary | ICD-10-CM | POA: Diagnosis not present

## 2022-08-07 DIAGNOSIS — G8929 Other chronic pain: Secondary | ICD-10-CM | POA: Diagnosis not present

## 2022-08-07 DIAGNOSIS — I252 Old myocardial infarction: Secondary | ICD-10-CM | POA: Diagnosis not present

## 2022-08-07 DIAGNOSIS — I119 Hypertensive heart disease without heart failure: Secondary | ICD-10-CM | POA: Diagnosis not present

## 2022-08-07 DIAGNOSIS — M1990 Unspecified osteoarthritis, unspecified site: Secondary | ICD-10-CM | POA: Diagnosis not present

## 2022-08-07 DIAGNOSIS — Z7902 Long term (current) use of antithrombotics/antiplatelets: Secondary | ICD-10-CM | POA: Diagnosis not present

## 2022-08-07 DIAGNOSIS — E782 Mixed hyperlipidemia: Secondary | ICD-10-CM | POA: Diagnosis not present

## 2022-08-07 DIAGNOSIS — I2510 Atherosclerotic heart disease of native coronary artery without angina pectoris: Secondary | ICD-10-CM | POA: Diagnosis not present

## 2022-08-08 DIAGNOSIS — I739 Peripheral vascular disease, unspecified: Secondary | ICD-10-CM | POA: Diagnosis not present

## 2022-08-08 DIAGNOSIS — M1990 Unspecified osteoarthritis, unspecified site: Secondary | ICD-10-CM | POA: Diagnosis not present

## 2022-08-08 DIAGNOSIS — I252 Old myocardial infarction: Secondary | ICD-10-CM | POA: Diagnosis not present

## 2022-08-08 DIAGNOSIS — G8929 Other chronic pain: Secondary | ICD-10-CM | POA: Diagnosis not present

## 2022-08-08 DIAGNOSIS — M5116 Intervertebral disc disorders with radiculopathy, lumbar region: Secondary | ICD-10-CM | POA: Diagnosis not present

## 2022-08-08 DIAGNOSIS — I2510 Atherosclerotic heart disease of native coronary artery without angina pectoris: Secondary | ICD-10-CM | POA: Diagnosis not present

## 2022-08-08 DIAGNOSIS — E782 Mixed hyperlipidemia: Secondary | ICD-10-CM | POA: Diagnosis not present

## 2022-08-08 DIAGNOSIS — I119 Hypertensive heart disease without heart failure: Secondary | ICD-10-CM | POA: Diagnosis not present

## 2022-08-08 DIAGNOSIS — Z7902 Long term (current) use of antithrombotics/antiplatelets: Secondary | ICD-10-CM | POA: Diagnosis not present

## 2022-08-13 DIAGNOSIS — Z7902 Long term (current) use of antithrombotics/antiplatelets: Secondary | ICD-10-CM | POA: Diagnosis not present

## 2022-08-13 DIAGNOSIS — I739 Peripheral vascular disease, unspecified: Secondary | ICD-10-CM | POA: Diagnosis not present

## 2022-08-13 DIAGNOSIS — M5116 Intervertebral disc disorders with radiculopathy, lumbar region: Secondary | ICD-10-CM | POA: Diagnosis not present

## 2022-08-13 DIAGNOSIS — I252 Old myocardial infarction: Secondary | ICD-10-CM | POA: Diagnosis not present

## 2022-08-13 DIAGNOSIS — M1990 Unspecified osteoarthritis, unspecified site: Secondary | ICD-10-CM | POA: Diagnosis not present

## 2022-08-13 DIAGNOSIS — I2510 Atherosclerotic heart disease of native coronary artery without angina pectoris: Secondary | ICD-10-CM | POA: Diagnosis not present

## 2022-08-13 DIAGNOSIS — G8929 Other chronic pain: Secondary | ICD-10-CM | POA: Diagnosis not present

## 2022-08-13 DIAGNOSIS — E782 Mixed hyperlipidemia: Secondary | ICD-10-CM | POA: Diagnosis not present

## 2022-08-13 DIAGNOSIS — I119 Hypertensive heart disease without heart failure: Secondary | ICD-10-CM | POA: Diagnosis not present

## 2022-08-15 ENCOUNTER — Telehealth: Payer: Self-pay | Admitting: Family Medicine

## 2022-08-15 DIAGNOSIS — M5116 Intervertebral disc disorders with radiculopathy, lumbar region: Secondary | ICD-10-CM | POA: Diagnosis not present

## 2022-08-15 DIAGNOSIS — Z7902 Long term (current) use of antithrombotics/antiplatelets: Secondary | ICD-10-CM | POA: Diagnosis not present

## 2022-08-15 DIAGNOSIS — M1990 Unspecified osteoarthritis, unspecified site: Secondary | ICD-10-CM | POA: Diagnosis not present

## 2022-08-15 DIAGNOSIS — I2510 Atherosclerotic heart disease of native coronary artery without angina pectoris: Secondary | ICD-10-CM | POA: Diagnosis not present

## 2022-08-15 DIAGNOSIS — I252 Old myocardial infarction: Secondary | ICD-10-CM | POA: Diagnosis not present

## 2022-08-15 DIAGNOSIS — I119 Hypertensive heart disease without heart failure: Secondary | ICD-10-CM | POA: Diagnosis not present

## 2022-08-15 DIAGNOSIS — G8929 Other chronic pain: Secondary | ICD-10-CM | POA: Diagnosis not present

## 2022-08-15 DIAGNOSIS — E782 Mixed hyperlipidemia: Secondary | ICD-10-CM | POA: Diagnosis not present

## 2022-08-15 DIAGNOSIS — I739 Peripheral vascular disease, unspecified: Secondary | ICD-10-CM | POA: Diagnosis not present

## 2022-08-15 NOTE — Telephone Encounter (Signed)
Tammy from [facility] called to make Dr. Charlett Blake aware that the patient has a rash on both her feet and the patient believes it started since she stated taking a certain medication. Patient was unable to say which medication it was since she threw the bottle away and stopped taking the medication.

## 2022-08-15 NOTE — Telephone Encounter (Signed)
Called pt was advised and pt stated she understand.  She has appt on 08-21-22. Advised if not improve go to ER

## 2022-08-16 ENCOUNTER — Other Ambulatory Visit: Payer: Self-pay | Admitting: Family Medicine

## 2022-08-16 DIAGNOSIS — E890 Postprocedural hypothyroidism: Secondary | ICD-10-CM

## 2022-08-17 ENCOUNTER — Telehealth: Payer: Self-pay | Admitting: Family Medicine

## 2022-08-17 NOTE — Telephone Encounter (Signed)
[name], [facility], called to advise she cannot accept the referral because she is unable to accept [facility] insurance due to a quota being met. Referral needs to be sent to another office.

## 2022-08-21 ENCOUNTER — Ambulatory Visit (INDEPENDENT_AMBULATORY_CARE_PROVIDER_SITE_OTHER): Payer: Medicare Other | Admitting: Family Medicine

## 2022-08-21 ENCOUNTER — Other Ambulatory Visit: Payer: Medicare Other

## 2022-08-21 VITALS — BP 130/64 | HR 67 | Temp 98.0°F | Resp 16 | Ht 65.0 in | Wt 192.0 lb

## 2022-08-21 DIAGNOSIS — R6889 Other general symptoms and signs: Secondary | ICD-10-CM | POA: Diagnosis not present

## 2022-08-21 DIAGNOSIS — I10 Essential (primary) hypertension: Secondary | ICD-10-CM | POA: Diagnosis not present

## 2022-08-21 DIAGNOSIS — G629 Polyneuropathy, unspecified: Secondary | ICD-10-CM | POA: Diagnosis not present

## 2022-08-21 DIAGNOSIS — M5412 Radiculopathy, cervical region: Secondary | ICD-10-CM | POA: Diagnosis not present

## 2022-08-21 DIAGNOSIS — M542 Cervicalgia: Secondary | ICD-10-CM

## 2022-08-21 DIAGNOSIS — E782 Mixed hyperlipidemia: Secondary | ICD-10-CM | POA: Diagnosis not present

## 2022-08-21 DIAGNOSIS — E890 Postprocedural hypothyroidism: Secondary | ICD-10-CM

## 2022-08-21 DIAGNOSIS — E60 Dietary zinc deficiency: Secondary | ICD-10-CM | POA: Diagnosis not present

## 2022-08-21 LAB — CBC
HCT: 41.4 % (ref 36.0–46.0)
Hemoglobin: 13.9 g/dL (ref 12.0–15.0)
MCHC: 33.4 g/dL (ref 30.0–36.0)
MCV: 87.1 fl (ref 78.0–100.0)
Platelets: 343 10*3/uL (ref 150.0–400.0)
RBC: 4.75 Mil/uL (ref 3.87–5.11)
RDW: 14.9 % (ref 11.5–15.5)
WBC: 8.7 10*3/uL (ref 4.0–10.5)

## 2022-08-21 LAB — SEDIMENTATION RATE: Sed Rate: 8 mm/hr (ref 0–30)

## 2022-08-21 LAB — COMPREHENSIVE METABOLIC PANEL
ALT: 12 U/L (ref 0–35)
AST: 16 U/L (ref 0–37)
Albumin: 4 g/dL (ref 3.5–5.2)
Alkaline Phosphatase: 82 U/L (ref 39–117)
BUN: 22 mg/dL (ref 6–23)
CO2: 29 mEq/L (ref 19–32)
Calcium: 9.4 mg/dL (ref 8.4–10.5)
Chloride: 102 mEq/L (ref 96–112)
Creatinine, Ser: 1.03 mg/dL (ref 0.40–1.20)
GFR: 53.19 mL/min — ABNORMAL LOW (ref >60.00)
Glucose, Bld: 95 mg/dL (ref 70–99)
Potassium: 4.3 mEq/L (ref 3.5–5.1)
Sodium: 143 mEq/L (ref 135–145)
Total Bilirubin: 0.4 mg/dL (ref 0.2–1.2)
Total Protein: 7.1 g/dL (ref 6.0–8.3)

## 2022-08-21 LAB — LIPID PANEL
Cholesterol: 195 mg/dL (ref 0–200)
HDL: 46.1 mg/dL (ref >39.00)
NonHDL: 148.68
Total CHOL/HDL Ratio: 4
Triglycerides: 274 mg/dL — ABNORMAL HIGH (ref 0.0–149.0)
VLDL: 54.8 mg/dL — ABNORMAL HIGH (ref 0.0–40.0)

## 2022-08-21 LAB — LDL CHOLESTEROL, DIRECT: Direct LDL: 106 mg/dL

## 2022-08-21 LAB — HIGH SENSITIVITY CRP: CRP, High Sensitivity: 9.41 mg/L — ABNORMAL HIGH (ref 0.000–5.000)

## 2022-08-21 LAB — TSH: TSH: 1.4 u[IU]/mL (ref 0.35–5.50)

## 2022-08-21 MED ORDER — DIAZEPAM 5 MG PO TABS: 5.0000 mg | ORAL_TABLET | Freq: Every day | ORAL | 0 refills | Status: DC | PRN

## 2022-08-21 NOTE — Addendum Note (Signed)
Addended by: Manuela Schwartz on: 08/21/2022 04:13 PM   Modules accepted: Orders

## 2022-08-21 NOTE — Assessment & Plan Note (Addendum)
Started in July, responded temporarily to steroids and has recurred is now in  Both hands and legs from knees down. Check Lyme, Ehrlichia, RMSF, Sed rate, crp, to evaluate.

## 2022-08-21 NOTE — Assessment & Plan Note (Signed)
Supplement and monitor 

## 2022-08-21 NOTE — Progress Notes (Signed)
Subjective:   By signing my name below, I, [name], attest that this documentation has been prepared under the direction and in the presence of Mosie Lukes, MD 08/21/2022.    Patient ID: [name], female    DOB: 1946/04/04, 76 y.o.   MRN: [id number]  Chief Complaint  Patient presents with   Follow-up    Here for Follow up   HPI Patient is in today for an office visit.  ROS: She denies fever, chills, and headaches today.   Neuropathy: She reports that at the end of 06/2022, she experienced a sharp pain originating in her back and radiating down to her feet. She states that she also experienced numbness in both her hands and feet. She was admitted to the ER where she was administered pain killers. She states that she was advised to see her consulting physician, Dr. Nelva Bush, who prescribed her a muscle relaxer and cortisone pills. She reports that these medications managed her pain for one week, but the pain returned in 07/2022. She says that she recently experienced numbness on her left cheek, causing her to call 911 and undergo an MRI. She complains that she is currently experiencing numbness bilaterally from her knees down and in both hands.   Diazepam: She is okay with taking Diazepam 5 mg to help control her anxiety for an upcoming neck MRI.   Tick: She reports that she discovered a tick in 04/2022. She is requesting to be checked for Lyme disease and [facility] spotted fever.  Thyroid: She is requesting for her thyroid to be checked. She complains that her hair is falling out and her nails have become brittle.   Immunizations: She has received 2 initial COVID-19 immunizations. She has been informed about receiving high-dose Flu and RSV vaccinations.  Supplements: She is currently taking multivitamins, calcium, vitamin D and a B- complex.   Past Medical History:  Diagnosis Date   Acute MI, subendocardial ([place])    Arthritis    Back pain 05/31/2013   Benign  paroxysmal positional vertigo 08/05/2016   CAD (coronary artery disease)    a. NSTEMI 10/2011: [place] 11/19/11: pLAD 30%, oOM 60%, AVCFX 30%, CFX after OM2 30%, pRCA 60 and 70%, then 99%, AM 80-90% with TIMI 3 flow.  PCI: Promus DES x 2 to RCA; b. 06/2012 Cath: patent RCA stents w/ subtl occl of Acute Marginal (jailed)->Med rx; c. 05/2015 MV: EF 59%, mod mid infsept/inf/ap lat/ap infarct with peri-infarct isch-->Med Rx; d. 02/2016 Cath: LM nl, LAD 101m RI 50, RCA patent stents.   Colitis    Facial skin lesion 01/28/2017   GERD (gastroesophageal reflux disease)    occasional   Hyperlipidemia    Hypertension    Hypertensive heart disease    a. Echocardiogram 11/19/11: Difficult acoustic windows, EF 60-65%, normal LV wall thickness, grade 1 diastolic dysfunction   Hypoparathyroidism (HCC)    Low zinc level 11/26/2016   Multinodular thyroid    Goiter s/p thyroidectomy in 2007 with post-op  hypocalcemia and post-op hypothyroidism/hypoparathyroidism with hypocalcemia   Neck pain on right side 07/13/2013   Nocturia 05/31/2013   Osteoarthritis    Pain in right axilla 03/13/2016   Palpitations    a. 03/2012 - patient set up for event monitor but did not wear correctly -  she declined wearing a repeat monitor   Post-surgical hypothyroidism    Sun-damaged skin 12/05/2014   Tubular adenoma of colon 06/2011   Unspecified constipation 05/31/2013   Vertigo  Zinc deficiency 11/26/2015   Past Surgical History:  Procedure Laterality Date   CARDIAC CATHETERIZATION N/A 03/08/2016   Procedure: Left Heart Cath and Coronary Angiography;  Surgeon: Jettie Booze, MD;  Location: [facility];  Service: Cardiovascular;  Laterality: N/A;   COLONOSCOPY  Aenomatous polyps   07/05/2011   COLONOSCOPY N/A 09/28/2014   Procedure: COLONOSCOPY;  Surgeon: Ladene Artist, MD;  Location: [facility];  Service: Endoscopy;  Laterality: N/A;   CORONARY ANGIOPLASTY WITH STENT PLACEMENT     LEFT HEART CATH AND CORONARY  ANGIOGRAPHY N/A 12/09/2018   Procedure: LEFT HEART CATH AND CORONARY ANGIOGRAPHY;  Surgeon: Jettie Booze, MD;  Location: [place] [facility];  Service: Cardiovascular;  Laterality: N/A;   LEFT HEART CATHETERIZATION WITH CORONARY ANGIOGRAM N/A 07/17/2012   Procedure: LEFT HEART CATHETERIZATION WITH CORONARY ANGIOGRAM;  Surgeon: Hillary Bow, MD;  Location: [facility] CATH LAB;  Service: Cardiovascular;  Laterality: N/A;   PERCUTANEOUS CORONARY STENT INTERVENTION (PCI-S) N/A 11/19/2011   Procedure: PERCUTANEOUS CORONARY STENT INTERVENTION (PCI-S);  Surgeon: Hillary Bow, MD;  Location: [facility] CATH LAB;  Service: Cardiovascular;  Laterality: N/A;   POLYPECTOMY     SHOULDER ARTHROSCOPY W/ ROTATOR CUFF REPAIR     right   TOTAL THYROIDECTOMY  2007   GOITER   Family History  Problem Relation Age of Onset   Colon cancer Brother    Cancer Brother        COLON   Hypertension Father    Heart disease Father    Heart attack Father    Blindness Sister    Congestive Heart Failure Sister    Hypertension Sister    Healthy Daughter    Healthy Son    Thyroid disease Sister    Cancer Brother        multiple myelomas   Healthy Daughter    Healthy Daughter    Diabetes Neg Hx    Prostate cancer Neg Hx    Breast cancer Neg Hx    Esophageal cancer Neg Hx    Rectal cancer Neg Hx    Stomach cancer Neg Hx    Social History   Socioeconomic History   Marital status: Married    Spouse name: Not on file   Number of children: 4   Years of education: 14   Highest education level: Not on file  Occupational History   Occupation: Sales person at [employer]   Smoking status: Never   Smokeless tobacco: Never  Vaping Use   Vaping Use: Never used  Substance and Sexual Activity   Alcohol use: Not Currently   Drug use: No   Sexual activity: Not Currently  Other Topics Concern   Not on file  Social History Narrative   Lives at home alone.  Her son lives near her.   Right-handed.   1 cup  coffee per day.   Social Determinants of Health   Financial Resource Strain: Low Risk  (05/07/2022)   Overall Financial Resource Strain (CARDIA)    Difficulty of Paying Living Expenses: Not hard at all  Food Insecurity: No Food Insecurity (05/07/2022)   Hunger Vital Sign    Worried About Running Out of Food in the Last Year: Never true    Ran Out of Food in the Last Year: Never true  Transportation Needs: No Transportation Needs (05/10/2022)   PRAPARE - Hydrologist (Medical): No    Lack of Transportation (Non-Medical): No  Recent  Concern: Transportation Needs - Unmet Transportation Needs (05/07/2022)   PRAPARE - Hydrologist (Medical): Yes    Lack of Transportation (Non-Medical): No  Physical Activity: Sufficiently Active (05/07/2022)   Exercise Vital Sign    Days of Exercise per Week: 7 days    Minutes of Exercise per Session: 30 min  Stress: No Stress Concern Present (05/07/2022)   [place]    Feeling of Stress : Not at all  Social Connections: Not on file  Intimate Partner Violence: Not At Risk (05/07/2022)   Humiliation, Afraid, Rape, and Kick questionnaire    Fear of Current or Ex-Partner: No    Emotionally Abused: No    Physically Abused: No    Sexually Abused: No   Outpatient Medications Prior to Visit  Medication Sig Dispense Refill   amLODipine (NORVASC) 5 MG tablet Take 1 tablet (5 mg total) by mouth daily. 90 tablet 3   aspirin EC 81 MG tablet Take 81 mg by mouth daily.     B Complex-C (B-COMPLEX WITH VITAMIN C) tablet Take 1 tablet by mouth daily.     bisoprolol (ZEBETA) 5 MG tablet TAKE 1 TABLET BY MOUTH DAILY 100 tablet 1   calcitRIOL (ROCALTROL) 0.25 MCG capsule Take 3 capsules (0.75 mcg total) by mouth daily. 270 capsule 3   Cholecalciferol (VITAMIN D-3) 25 MCG (1000 UT) CAPS Take 3000-4000 units by mouth daily 60 capsule 0   clopidogrel  (PLAVIX) 75 MG tablet Take 1 tablet (75 mg total) by mouth daily. 90 tablet 3   furosemide (LASIX) 20 MG tablet TAKE 1 TABLET BY MOUTH ONCE WEEKLY AS NEEDED 15 tablet 2   isosorbide mononitrate (IMDUR) 60 MG 24 hr tablet Take 1 tablet (60 mg total) by mouth daily. 90 tablet 3   levothyroxine (SYNTHROID) 112 MCG tablet TAKE 1 TABLET BY MOUTH DAILY  BEFORE BREAKFAST 100 tablet 2   methylPREDNISolone (MEDROL DOSEPAK) 4 MG TBPK tablet Follow package insert 21 each 0   Multiple Vitamin (MULTIVITAMIN WITH MINERALS) TABS tablet Take 1 tablet by mouth daily.     nitroGLYCERIN (NITROSTAT) 0.4 MG SL tablet DISSOLVE 1 TABLET UNDER THE TONGUE EVERY 5 MINUTES AS  NEEDED FOR CHEST PAIN. MAX  OF 3 TABLETS IN 15 MINUTES. CALL 911 IF PAIN PERSISTS. 75 tablet 2   ondansetron (ZOFRAN) 4 MG tablet Take 1 tablet (4 mg total) by mouth every 6 (six) hours. 12 tablet 0   oxyCODONE (ROXICODONE) 5 MG immediate release tablet Take 1 tablet (5 mg total) by mouth every 6 (six) hours as needed for up to 20 doses for breakthrough pain. 20 tablet 0   pantoprazole (PROTONIX) 40 MG tablet TAKE 1 TABLET BY MOUTH  DAILY 100 tablet 2   REPATHA SURECLICK 542 MG/ML SOAJ INJECT 140MG SUBCUTANEOUSLY EVERY 2 WEEKS 6 mL 3   rosuvastatin (CRESTOR) 40 MG tablet TAKE 1 TABLET BY MOUTH DAILY 90 tablet 3   tiZANidine (ZANAFLEX) 2 MG tablet Take 1 tablet (2 mg total) by mouth as needed for muscle spasms (Take at night as needed). 30 tablet 0   No facility-administered medications prior to visit.   Allergies  Allergen Reactions   Zetia [Ezetimibe] Other (See Comments)    Myalgia, paresthesias and weakness   Review of Systems  Constitutional:  Negative for chills, fever and malaise/fatigue.  HENT:  Negative for congestion.   Eyes:  Negative for blurred vision.  Respiratory:  Negative for shortness  of breath.   Cardiovascular:  Negative for chest pain, palpitations and leg swelling.  Gastrointestinal:  Negative for abdominal pain, blood in  stool and nausea.  Genitourinary:  Negative for dysuria and frequency.  Musculoskeletal:  Positive for back pain, myalgias and neck pain. Negative for falls.  Skin:  Negative for rash.  Neurological:  Negative for dizziness, loss of consciousness and headaches.       (+) Neuropathy. (+) Numbness bilaterally in hands and knees down.  Endo/Heme/Allergies:  Negative for environmental allergies.  Psychiatric/Behavioral:  Negative for depression. The patient is not nervous/anxious.       Objective:    Physical Exam Constitutional:      General: She is not in acute distress.    Appearance: Normal appearance. She is not ill-appearing.  HENT:     Head: Normocephalic and atraumatic.     Right Ear: External ear normal.     Left Ear: External ear normal.     Mouth/Throat:     Mouth: Mucous membranes are moist.     Pharynx: Oropharynx is clear.  Eyes:     Extraocular Movements: Extraocular movements intact.     Pupils: Pupils are equal, round, and reactive to light.  Cardiovascular:     Rate and Rhythm: Normal rate and regular rhythm.     Pulses: Normal pulses.     Heart sounds: Normal heart sounds. No murmur heard.    No gallop.  Pulmonary:     Effort: Pulmonary effort is normal. No respiratory distress.     Breath sounds: Normal breath sounds. No wheezing or rales.  Abdominal:     General: Bowel sounds are normal.  Skin:    General: Skin is warm and dry.     Comments: (+) Blisters on left foot.  Neurological:     Mental Status: She is alert and oriented to person, place, and time.  Psychiatric:        Mood and Affect: Mood normal.        Behavior: Behavior normal.        Judgment: Judgment normal.    BP 130/64 (BP Location: Right Arm, Patient Position: Sitting, Cuff Size: Normal)   Pulse 67   Temp 98 F (36.7 C) (Oral)   Resp 16   Ht 5' 5"  (1.651 m)   Wt 192 lb (87.1 kg)   SpO2 95%   BMI 31.95 kg/m  Wt Readings from Last 3 Encounters:  08/21/22 192 lb (87.1 kg)   07/27/22 191 lb 9.6 oz (86.9 kg)  07/24/22 190 lb (86.2 kg)   Diabetic Foot Exam - Simple   No data filed    Lab Results  Component Value Date   WBC 11.9 (H) 07/24/2022   HGB 14.3 07/24/2022   HCT 42.7 07/24/2022   PLT 312 07/24/2022   GLUCOSE 98 07/24/2022   CHOL 195 01/01/2022   TRIG 234 (H) 01/01/2022   HDL 46 01/01/2022   LDLDIRECT 84.0 12/28/2020   LDLCALC 109 (H) 01/01/2022   ALT 15 07/24/2022   AST 11 (L) 07/24/2022   NA 139 07/24/2022   K 3.4 (L) 07/24/2022   CL 106 07/24/2022   CREATININE 1.06 (H) 07/24/2022   BUN 22 07/24/2022   CO2 25 07/24/2022   TSH 0.829 07/24/2022   INR 0.86 03/07/2016   HGBA1C 5.6 05/11/2022   Lab Results  Component Value Date   TSH 0.829 07/24/2022   Lab Results  Component Value Date   WBC 11.9 (H) 07/24/2022  HGB 14.3 07/24/2022   HCT 42.7 07/24/2022   MCV 86.8 07/24/2022   PLT 312 07/24/2022   Lab Results  Component Value Date   NA 139 07/24/2022   K 3.4 (L) 07/24/2022   CO2 25 07/24/2022   GLUCOSE 98 07/24/2022   BUN 22 07/24/2022   CREATININE 1.06 (H) 07/24/2022   BILITOT 0.8 07/24/2022   ALKPHOS 63 07/24/2022   AST 11 (L) 07/24/2022   ALT 15 07/24/2022   PROT 5.9 (L) 07/24/2022   ALBUMIN 3.1 (L) 07/24/2022   CALCIUM 7.9 (L) 07/24/2022   ANIONGAP 8 07/24/2022   EGFR 68 05/11/2022   GFR 49.88 (L) 02/12/2022   Lab Results  Component Value Date   CHOL 195 01/01/2022   Lab Results  Component Value Date   HDL 46 01/01/2022   Lab Results  Component Value Date   LDLCALC 109 (H) 01/01/2022   Lab Results  Component Value Date   TRIG 234 (H) 01/01/2022   Lab Results  Component Value Date   CHOLHDL 4.2 01/01/2022   Lab Results  Component Value Date   HGBA1C 5.6 05/11/2022      Assessment & Plan:   Problem List Items Addressed This Visit     Hypothyroidism    On Levothyroxine, continue to monitor      Hyperlipidemia    Tolerating statin, encouraged heart healthy diet, avoid trans fats,  minimize simple carbs and saturated fats. Increase exercise as tolerated      Relevant Orders   Lipid panel   Essential hypertension, benign    Well controlled, no changes to meds. Encouraged heart healthy diet such as the DASH diet and exercise as tolerated.       Relevant Orders   CBC   Comprehensive metabolic panel   TSH   Neck pain on right side    With paresthesias into right arm. Encouraged moist heat and gentle stretching as tolerated. May try NSAIDs and prescription meds as directed and report if symptoms worsen or seek immediate care. Continue to follow with Dr Nelva Bush proceed with MRI cervical spine.       Zinc deficiency    Supplement and monitor      Relevant Orders   Zinc   Neuropathy    Started in July, responded temporarily to steroids and has recurred is now in  Both hands and legs from knees down. Check Lyme, Ehrlichia, RMSF, Sed rate, crp, to evaluate.       Relevant Orders   Sedimentation rate   B. burgdorfi antibodies   [place] mtn spotted fvr abs pnl(IgG+IgM)   Ehrlichia antibody panel   CRP High sensitivity   Other Visit Diagnoses     Cervical radiculopathy    -  Primary   Relevant Medications   diazepam (VALIUM) 5 MG tablet   Other Relevant Orders   MR Cervical Spine Wo Contrast   Neck pain without injury       Relevant Orders   MR Cervical Spine Wo Contrast   Flu-like symptoms       Relevant Orders   SARS-COV-2 IgG      Meds ordered this encounter  Medications   diazepam (VALIUM) 5 MG tablet    Sig: Take 1 tablet (5 mg total) by mouth daily as needed for anxiety (MRI). Take 1 one hour prior to MRI and another when arrive    Dispense:  10 tablet    Refill:  0   I, Penni Homans, MD, personally preformed the  services described in this documentation.  All medical record entries made by the scribe were at my direction and in my presence.  I have reviewed the chart and discharge instructions (if applicable) and agree that the record reflects my  personal performance and is accurate and complete. 08/21/2022  I,Mohammed Iqbal,acting as a scribe for Penni Homans, MD.,have documented all relevant documentation on the behalf of Penni Homans, MD,as directed by  Penni Homans, MD while in the presence of Penni Homans, MD.  Penni Homans, MD

## 2022-08-21 NOTE — Assessment & Plan Note (Signed)
Tolerating statin, encouraged heart healthy diet, avoid trans fats, minimize simple carbs and saturated fats. Increase exercise as tolerated 

## 2022-08-21 NOTE — Assessment & Plan Note (Addendum)
With paresthesias into right arm. Encouraged moist heat and gentle stretching as tolerated. May try NSAIDs and prescription meds as directed and report if symptoms worsen or seek immediate care. Continue to follow with Dr Nelva Bush proceed with MRI cervical spine.

## 2022-08-21 NOTE — Assessment & Plan Note (Signed)
Well controlled, no changes to meds. Encouraged heart healthy diet such as the DASH diet and exercise as tolerated.  °

## 2022-08-21 NOTE — Patient Instructions (Addendum)
Respiratory Syncitial Virus (RSV) vaccine at pharmacy High dose flu shot mid october   Neuropathic Pain Neuropathic pain is pain caused by damage to the nerves that are responsible for certain sensations in your body (sensory nerves). Neuropathic pain can make you more sensitive to pain. Even a minor sensation can feel very painful. This is usually a long-term (chronic) condition that can be difficult to treat. The type of pain differs from person to person. It may: Start suddenly (acute), or it may develop slowly and become chronic. Come and go as damaged nerves heal, or it may stay at the same level for years. Cause emotional distress, loss of sleep, and a lower quality of life. What are the causes? The most common cause of this condition is diabetes. Many other diseases and conditions can also cause neuropathic pain. Causes of neuropathic pain can be classified as: Toxic. This is caused by medicines and chemicals. The most common causes of toxic neuropathic pain is damage from medicines that kill cancer cells (chemotherapy) or alcohol abuse. Metabolic. This can be caused by: Diabetes. Lack of vitamins like B12. Traumatic. Any injury that cuts, crushes, or stretches a nerve can cause damage and pain. Compression-related. If a sensory nerve gets trapped or compressed for a long period of time, the blood supply to the nerve can be cut off. Vascular. Many blood vessel diseases can cause neuropathic pain by decreasing blood supply and oxygen to nerves. Autoimmune. This type of pain results from diseases in which the body's defense system (immune system) mistakenly attacks sensory nerves. Examples of autoimmune diseases that can cause neuropathic pain include lupus and multiple sclerosis. Infectious. Many types of viral infections can damage sensory nerves and cause pain. Shingles infection is a common cause of this type of pain. Inherited. Neuropathic pain can be a symptom of many diseases that  are passed down through families (genetic). What increases the risk? You are more likely to develop this condition if: You have diabetes. You smoke. You drink too much alcohol. You are taking certain medicines, including chemotherapy or medicines that treat immune system disorders. What are the signs or symptoms? The main symptom is pain. Neuropathic pain is often described as: Burning. Shock-like. Stinging. Hot or cold. Itching. How is this diagnosed? No single test can diagnose neuropathic pain. It is diagnosed based on: A physical exam and your symptoms. Your health care provider will ask you about your pain. You may be asked to use a pain scale to describe how bad your pain is. Tests. These may be done to see if you have a cause and location of any nerve damage. They include: Nerve conduction studies and electromyography to test how well nerve signals travel through your nerves and muscles (electrodiagnostic testing). Skin biopsy to evaluate for small fiber neuropathy. Imaging studies, such as: X-rays. CT scan. MRI. How is this treated? Treatment for neuropathic pain may change over time. You may need to try different treatment options or a combination of treatments. Some options include: Treating the underlying cause of the neuropathy, such as diabetes, kidney disease, or vitamin deficiencies. Stopping medicines that can cause neuropathy, such as chemotherapy. Medicine to relieve pain. Medicines may include: Prescription or over-the-counter pain medicine. Anti-seizure medicine. Antidepressant medicines. Pain-relieving patches or creams that are applied to painful areas of skin. A medicine to numb the area (local anesthetic), which can be injected as a nerve block. Transcutaneous nerve stimulation. This uses electrical currents to block painful nerve signals. The treatment is painless. Alternative  treatments, such as: Acupuncture. Meditation. Massage. Occupational or  physical therapy. Pain management programs. Counseling. Follow these instructions at home: Medicines  Take over-the-counter and prescription medicines only as told by your health care provider. Ask your health care provider if the medicine prescribed to you: Requires you to avoid driving or using machinery. Can cause constipation. You may need to take these actions to prevent or treat constipation: Drink enough fluid to keep your urine pale yellow. Take over-the-counter or prescription medicines. Eat foods that are high in fiber, such as beans, whole grains, and fresh fruits and vegetables. Limit foods that are high in fat and processed sugars, such as fried or sweet foods. Lifestyle  Have a good support system at home. Consider joining a chronic pain support group. Do not use any products that contain nicotine or tobacco. These products include cigarettes, chewing tobacco, and vaping devices, such as e-cigarettes. If you need help quitting, ask your health care provider. Do not drink alcohol. General instructions Learn as much as you can about your condition. Work closely with all your health care providers to find the treatment plan that works best for you. Ask your health care provider what activities are safe for you. Keep all follow-up visits. This is important. Contact a health care provider if: Your pain treatments are not working. You are having side effects from your medicines. You are struggling with tiredness (fatigue), mood changes, depression, or anxiety. Get help right away if: You have thoughts of hurting yourself. Get help right away if you feel like you may hurt yourself or others, or have thoughts about taking your own life. Go to your nearest emergency room or: Call 911. Call the [facility] at [phone] or 988. This is open 24 hours a day. Text the Crisis Text Line at 250-367-1418. Summary Neuropathic pain is pain caused by damage to the  nerves that are responsible for certain sensations in your body (sensory nerves). Neuropathic pain may come and go as damaged nerves heal, or it may stay at the same level for years. Neuropathic pain is usually a long-term condition that can be difficult to treat. Consider joining a chronic pain support group. This information is not intended to replace advice given to you by your health care provider. Make sure you discuss any questions you have with your health care provider. Document Revised: 08/07/2021 Document Reviewed: 08/07/2021 Elsevier Patient Education  [place].

## 2022-08-21 NOTE — Assessment & Plan Note (Signed)
On Levothyroxine, continue to monitor 

## 2022-08-22 DIAGNOSIS — I2510 Atherosclerotic heart disease of native coronary artery without angina pectoris: Secondary | ICD-10-CM | POA: Diagnosis not present

## 2022-08-22 DIAGNOSIS — E782 Mixed hyperlipidemia: Secondary | ICD-10-CM | POA: Diagnosis not present

## 2022-08-22 DIAGNOSIS — M1990 Unspecified osteoarthritis, unspecified site: Secondary | ICD-10-CM | POA: Diagnosis not present

## 2022-08-22 DIAGNOSIS — I739 Peripheral vascular disease, unspecified: Secondary | ICD-10-CM | POA: Diagnosis not present

## 2022-08-22 DIAGNOSIS — I252 Old myocardial infarction: Secondary | ICD-10-CM | POA: Diagnosis not present

## 2022-08-22 DIAGNOSIS — G8929 Other chronic pain: Secondary | ICD-10-CM | POA: Diagnosis not present

## 2022-08-22 DIAGNOSIS — I119 Hypertensive heart disease without heart failure: Secondary | ICD-10-CM | POA: Diagnosis not present

## 2022-08-22 DIAGNOSIS — M5116 Intervertebral disc disorders with radiculopathy, lumbar region: Secondary | ICD-10-CM | POA: Diagnosis not present

## 2022-08-22 DIAGNOSIS — Z7902 Long term (current) use of antithrombotics/antiplatelets: Secondary | ICD-10-CM | POA: Diagnosis not present

## 2022-08-23 DIAGNOSIS — B359 Dermatophytosis, unspecified: Secondary | ICD-10-CM | POA: Diagnosis not present

## 2022-08-23 DIAGNOSIS — B353 Tinea pedis: Secondary | ICD-10-CM | POA: Diagnosis not present

## 2022-08-23 LAB — SARS COV-2 SEROLOGY(COVID-19)AB(IGG,IGM),IMMUNOASSAY
SARS CoV-2 AB IgG: NEGATIVE
SARS CoV-2 IgM: NEGATIVE

## 2022-08-23 LAB — ZINC: Zinc: 78 ug/dL (ref 60–130)

## 2022-08-24 DIAGNOSIS — Z7902 Long term (current) use of antithrombotics/antiplatelets: Secondary | ICD-10-CM | POA: Diagnosis not present

## 2022-08-24 DIAGNOSIS — M5116 Intervertebral disc disorders with radiculopathy, lumbar region: Secondary | ICD-10-CM | POA: Diagnosis not present

## 2022-08-24 DIAGNOSIS — M1990 Unspecified osteoarthritis, unspecified site: Secondary | ICD-10-CM | POA: Diagnosis not present

## 2022-08-24 DIAGNOSIS — G8929 Other chronic pain: Secondary | ICD-10-CM | POA: Diagnosis not present

## 2022-08-24 DIAGNOSIS — E782 Mixed hyperlipidemia: Secondary | ICD-10-CM | POA: Diagnosis not present

## 2022-08-24 DIAGNOSIS — I119 Hypertensive heart disease without heart failure: Secondary | ICD-10-CM | POA: Diagnosis not present

## 2022-08-24 DIAGNOSIS — I252 Old myocardial infarction: Secondary | ICD-10-CM | POA: Diagnosis not present

## 2022-08-24 DIAGNOSIS — I739 Peripheral vascular disease, unspecified: Secondary | ICD-10-CM | POA: Diagnosis not present

## 2022-08-24 DIAGNOSIS — I2510 Atherosclerotic heart disease of native coronary artery without angina pectoris: Secondary | ICD-10-CM | POA: Diagnosis not present

## 2022-08-25 LAB — B. BURGDORFI ANTIBODIES: B burgdorferi Ab IgG+IgM: <0.9 index

## 2022-08-25 LAB — ROCKY MTN SPOTTED FVR ABS PNL(IGG+IGM)
RMSF IgG: NOT DETECTED
RMSF IgM: NOT DETECTED

## 2022-08-25 LAB — EHRLICHIA ANTIBODY PANEL
E. CHAFFEENSIS AB IGG: <1:64 {titer}
E. CHAFFEENSIS AB IGM: <1:20 {titer}

## 2022-08-28 ENCOUNTER — Ambulatory Visit (INDEPENDENT_AMBULATORY_CARE_PROVIDER_SITE_OTHER): Payer: Medicare Other | Admitting: Neurology

## 2022-08-28 ENCOUNTER — Encounter: Payer: Self-pay | Admitting: Neurology

## 2022-08-28 VITALS — BP 133/70 | HR 61 | Ht 65.0 in | Wt 186.0 lb

## 2022-08-28 DIAGNOSIS — M542 Cervicalgia: Secondary | ICD-10-CM | POA: Diagnosis not present

## 2022-08-28 DIAGNOSIS — G8929 Other chronic pain: Secondary | ICD-10-CM | POA: Diagnosis not present

## 2022-08-28 DIAGNOSIS — R202 Paresthesia of skin: Secondary | ICD-10-CM | POA: Diagnosis not present

## 2022-08-28 NOTE — Progress Notes (Signed)
Chief Complaint  Patient presents with   New Patient (Initial Visit)    Rm 15. Accompanied by friend. NX Krista Blue /Internal referral for facial numbness and tingling.      ASSESSMENT AND PLAN  [name] is a 76 y.o. female   Transient left facial paresthesia  Could not rule out possibility of TIA involving right thalamus  She does has vascular risk factors of hypertension, hyperlipidemia, coronary artery disease, aging,  Keep Current antiplatelet agent, aspirin 81 mg daily  She does not want to have MRI of the brain, proceed with ultrasound of carotid artery, echocardiogram to complete TIA work-up  Persistent bilateral upper and lower extremity paresthesia  Brisk bilateral knee reflex,  MRI of cervical is pending from her primary care physician   DIAGNOSTIC DATA (LABS, IMAGING, TESTING) - I reviewed patient records, labs, notes, testing and imaging myself where available.   MEDICAL HISTORY:  [name], is a 76 year old female, accompanied by her friend, referred by her primary care physician Dr. Charlett Blake, Erline Levine A, for evaluation of left facial paresthesia, fingertips and lower extremity paresthesia, initial evaluation was on August 28, 2022  I reviewed and summarized the referring note. PMHX. HTN Hypothyroidism Chronic neck pain GERD HLD CAD Zinc deficiency Right rotator cuff  Since July 2023, without clear triggers she began to notice numbness involving tips of her bilateral fingers, symmetric, also from bilateral knee down, she could not feel the ground very well, for that reason she been using a cane, she denies significant muscle weakness, no bowel or bladder incontinence, she does have long history of chronic neck pain radiating pain to bilateral shoulder, more to the right side, MRI of cervical is pending from primary care physician  In August 2023 1 day, she had sudden onset left facial numbness, lasting for couple hours, denies left proximal arm  numbness, denies weakness, no dysarthria, no facial numbness has gone, she worried about brain pathology, wants to have it checked out,  She does have significant vascular risk factor, had a history of heart attack, stent placement   PHYSICAL EXAM:   Vitals:   08/28/22 1246  BP: 133/70  Pulse: 61  Weight: 186 lb (84.4 kg)  Height: '5\' 5"'$  (1.651 m)   Not recorded     Body mass index is 30.95 kg/m.  PHYSICAL EXAMNIATION:  Gen: NAD, conversant, well nourised, well groomed                     Cardiovascular: Regular rate rhythm, no peripheral edema, warm, nontender. Eyes: Conjunctivae clear without exudates or hemorrhage Neck: Supple, no carotid bruits. Pulmonary: Clear to auscultation bilaterally   NEUROLOGICAL EXAM:  MENTAL STATUS: Speech/cognition: Depressed looking elderly female, awake, alert, oriented to history taking and casual conversation CRANIAL NERVES: CN II: Visual fields are full to confrontation. Pupils are round equal and briskly reactive to light. CN III, IV, VI: extraocular movement are normal. No ptosis. CN V: Facial sensation is intact to light touch CN VII: Face is symmetric with normal eye closure  CN VIII: Hearing is normal to causal conversation. CN IX, X: Phonation is normal. CN XI: Head turning and shoulder shrug are intact  MOTOR: There is no pronator drift of out-stretched arms. Muscle bulk and tone are normal. Muscle strength is normal.  REFLEXES: Reflexes are 1 and symmetric at the biceps, triceps, 2/2 knees, and ankles. Plantar responses are flexor.  SENSORY: Intact to light touch, pinprick and vibratory sensation are intact in  fingers and toes.  COORDINATION: There is no trunk or limb dysmetria noted.  GAIT/STANCE: Gait push-up to get up from seated position, cautious  REVIEW OF SYSTEMS:  Full 14 system review of systems performed and notable only for as above All other review of systems were negative.   ALLERGIES: Allergies   Allergen Reactions   Zetia [Ezetimibe] Other (See Comments)    Myalgia, paresthesias and weakness   Penicillin G Rash    HOME MEDICATIONS: Current Outpatient Medications  Medication Sig Dispense Refill   amLODipine (NORVASC) 5 MG tablet Take 1 tablet (5 mg total) by mouth daily. 90 tablet 3   aspirin EC 81 MG tablet Take 81 mg by mouth daily.     B Complex-C (B-COMPLEX WITH VITAMIN C) tablet Take 1 tablet by mouth daily.     bisoprolol (ZEBETA) 5 MG tablet TAKE 1 TABLET BY MOUTH DAILY 100 tablet 1   calcitRIOL (ROCALTROL) 0.25 MCG capsule Take 3 capsules (0.75 mcg total) by mouth daily. 270 capsule 3   Cholecalciferol (VITAMIN D-3) 25 MCG (1000 UT) CAPS Take 3000-4000 units by mouth daily 60 capsule 0   clopidogrel (PLAVIX) 75 MG tablet Take 1 tablet (75 mg total) by mouth daily. 90 tablet 3   diazepam (VALIUM) 5 MG tablet Take 1 tablet (5 mg total) by mouth daily as needed for anxiety (MRI). Take 1 one hour prior to MRI and another when arrive 10 tablet 0   furosemide (LASIX) 20 MG tablet TAKE 1 TABLET BY MOUTH ONCE WEEKLY AS NEEDED 15 tablet 2   isosorbide mononitrate (IMDUR) 60 MG 24 hr tablet Take 1 tablet (60 mg total) by mouth daily. 90 tablet 3   levothyroxine (SYNTHROID) 112 MCG tablet TAKE 1 TABLET BY MOUTH DAILY  BEFORE BREAKFAST 100 tablet 2   methylPREDNISolone (MEDROL DOSEPAK) 4 MG TBPK tablet Follow package insert 21 each 0   Multiple Vitamin (MULTIVITAMIN WITH MINERALS) TABS tablet Take 1 tablet by mouth daily.     nitroGLYCERIN (NITROSTAT) 0.4 MG SL tablet DISSOLVE 1 TABLET UNDER THE TONGUE EVERY 5 MINUTES AS  NEEDED FOR CHEST PAIN. MAX  OF 3 TABLETS IN 15 MINUTES. CALL 911 IF PAIN PERSISTS. 75 tablet 2   ondansetron (ZOFRAN) 4 MG tablet Take 1 tablet (4 mg total) by mouth every 6 (six) hours. 12 tablet 0   oxyCODONE (ROXICODONE) 5 MG immediate release tablet Take 1 tablet (5 mg total) by mouth every 6 (six) hours as needed for up to 20 doses for breakthrough pain. 20 tablet  0   pantoprazole (PROTONIX) 40 MG tablet TAKE 1 TABLET BY MOUTH  DAILY 100 tablet 2   REPATHA SURECLICK 381 MG/ML SOAJ INJECT '140MG'$  SUBCUTANEOUSLY EVERY 2 WEEKS 6 mL 3   rosuvastatin (CRESTOR) 40 MG tablet TAKE 1 TABLET BY MOUTH DAILY 90 tablet 3   No current facility-administered medications for this visit.    PAST MEDICAL HISTORY: Past Medical History:  Diagnosis Date   Acute MI, subendocardial ([place])    Arthritis    Back pain 05/31/2013   Benign paroxysmal positional vertigo 08/05/2016   CAD (coronary artery disease)    a. NSTEMI 10/2011: [place] 11/19/11: pLAD 30%, oOM 60%, AVCFX 30%, CFX after OM2 30%, pRCA 60 and 70%, then 99%, AM 80-90% with TIMI 3 flow.  PCI: Promus DES x 2 to RCA; b. 06/2012 Cath: patent RCA stents w/ subtl occl of Acute Marginal (jailed)->Med rx; c. 05/2015 MV: EF 59%, mod mid infsept/inf/ap lat/ap infarct with peri-infarct isch-->Med  Rx; d. 02/2016 Cath: LM nl, LAD 65m RI 50, RCA patent stents.   Colitis    Facial skin lesion 01/28/2017   GERD (gastroesophageal reflux disease)    occasional   Hyperlipidemia    Hypertension    Hypertensive heart disease    a. Echocardiogram 11/19/11: Difficult acoustic windows, EF 60-65%, normal LV wall thickness, grade 1 diastolic dysfunction   Hypoparathyroidism (HCC)    Low zinc level 11/26/2016   Multinodular thyroid    Goiter s/p thyroidectomy in 2007 with post-op  hypocalcemia and post-op hypothyroidism/hypoparathyroidism with hypocalcemia   Neck pain on right side 07/13/2013   Nocturia 05/31/2013   Osteoarthritis    Pain in right axilla 03/13/2016   Palpitations    a. 03/2012 - patient set up for event monitor but did not wear correctly -  she declined wearing a repeat monitor   Post-surgical hypothyroidism    Sun-damaged skin 12/05/2014   Tubular adenoma of colon 06/2011   Unspecified constipation 05/31/2013   Vertigo    Zinc deficiency 11/26/2015    PAST SURGICAL HISTORY: Past Surgical History:  Procedure Laterality Date    CARDIAC CATHETERIZATION N/A 03/08/2016   Procedure: Left Heart Cath and Coronary Angiography;  Surgeon: JJettie Booze MD;  [facility];  Service: Cardiovascular;  Laterality: N/A;   COLONOSCOPY  Aenomatous polyps   07/05/2011   COLONOSCOPY N/A 09/28/2014   Procedure: COLONOSCOPY;  Surgeon: MLadene Artist MD;  [facility]: [facility];  Service: Endoscopy;  Laterality: N/A;   CORONARY ANGIOPLASTY WITH STENT PLACEMENT     LEFT HEART CATH AND CORONARY ANGIOGRAPHY N/A 12/09/2018   Procedure: LEFT HEART CATH AND CORONARY ANGIOGRAPHY;  Surgeon: VJettie Booze MD;  [facility];  Service: Cardiovascular;  Laterality: N/A;   LEFT HEART CATHETERIZATION WITH CORONARY ANGIOGRAM N/A 07/17/2012   Procedure: LEFT HEART CATHETERIZATION WITH CORONARY ANGIOGRAM;  Surgeon: THillary Bow MD;  [facility];  Service: Cardiovascular;  Laterality: N/A;   PERCUTANEOUS CORONARY STENT INTERVENTION (PCI-S) N/A 11/19/2011   Procedure: PERCUTANEOUS CORONARY STENT INTERVENTION (PCI-S);  Surgeon: THillary Bow MD;  [facility];  Service: Cardiovascular;  Laterality: N/A;   POLYPECTOMY     SHOULDER ARTHROSCOPY W/ ROTATOR CUFF REPAIR     right   TOTAL THYROIDECTOMY  2007   GOITER    FAMILY HISTORY: Family History  Problem Relation Age of Onset   Colon cancer Brother    Cancer Brother        COLON   Hypertension Father    Heart disease Father    Heart attack Father    Blindness Sister    Congestive Heart Failure Sister    Hypertension Sister    Healthy Daughter    Healthy Son    Thyroid disease Sister    Cancer Brother        multiple myelomas   Healthy Daughter    Healthy Daughter    Diabetes Neg Hx    Prostate cancer Neg Hx    Breast cancer Neg Hx    Esophageal cancer Neg Hx    Rectal cancer Neg Hx    Stomach cancer Neg Hx     SOCIAL HISTORY: Social History   Socioeconomic History   Marital status: Married    Spouse name: Not  on file   Number of children: 4   Years of education: 14   Highest education level: Not on file  Occupational History   Occupation: SPress photographer  person at [employer]  Tobacco Use   Smoking status: Never   Smokeless tobacco: Never  Vaping Use   Vaping Use: Never used  Substance and Sexual Activity   Alcohol use: Not Currently   Drug use: No   Sexual activity: Not Currently  Other Topics Concern   Not on file  Social History Narrative   Lives at home alone.  Her son lives near her.   Right-handed.   1 cup coffee per day.   Social Determinants of Health   Financial Resource Strain: Low Risk  (05/07/2022)   Overall Financial Resource Strain (CARDIA)    Difficulty of Paying Living Expenses: Not hard at all  Food Insecurity: No Food Insecurity (05/07/2022)   Hunger Vital Sign    Worried About Running Out of Food in the Last Year: Never true    Ran Out of Food in the Last Year: Never true  Transportation Needs: No Transportation Needs (05/10/2022)   PRAPARE - Hydrologist (Medical): No    Lack of Transportation (Non-Medical): No  Recent Concern: Transportation Needs - Unmet Transportation Needs (05/07/2022)   PRAPARE - Hydrologist (Medical): Yes    Lack of Transportation (Non-Medical): No  Physical Activity: Sufficiently Active (05/07/2022)   Exercise Vital Sign    Days of Exercise per Week: 7 days    Minutes of Exercise per Session: 30 min  Stress: No Stress Concern Present (05/07/2022)   [place]    Feeling of Stress : Not at all  Social Connections: Not on file  Intimate Partner Violence: Not At Risk (05/07/2022)   Humiliation, Afraid, Rape, and Kick questionnaire    Fear of Current or Ex-Partner: No    Emotionally Abused: No    Physically Abused: No    Sexually Abused: No      Marcial Pacas, M.D. Ph.D.  [facility] [address] [place], [place] [address] Ph: [phone] Fax: [phone]  CC:  Terrilyn Saver, NP [address] [place],  [place] [address]  Mosie Lukes, MD This is Mrs.   Facial nerve You may

## 2022-08-30 DIAGNOSIS — M4319 Spondylolisthesis, multiple sites in spine: Secondary | ICD-10-CM | POA: Diagnosis not present

## 2022-08-30 DIAGNOSIS — M4802 Spinal stenosis, cervical region: Secondary | ICD-10-CM | POA: Diagnosis not present

## 2022-08-30 DIAGNOSIS — M47814 Spondylosis without myelopathy or radiculopathy, thoracic region: Secondary | ICD-10-CM | POA: Diagnosis not present

## 2022-08-30 DIAGNOSIS — M47812 Spondylosis without myelopathy or radiculopathy, cervical region: Secondary | ICD-10-CM | POA: Diagnosis not present

## 2022-08-31 DIAGNOSIS — Z7902 Long term (current) use of antithrombotics/antiplatelets: Secondary | ICD-10-CM | POA: Diagnosis not present

## 2022-08-31 DIAGNOSIS — M1990 Unspecified osteoarthritis, unspecified site: Secondary | ICD-10-CM | POA: Diagnosis not present

## 2022-08-31 DIAGNOSIS — I739 Peripheral vascular disease, unspecified: Secondary | ICD-10-CM | POA: Diagnosis not present

## 2022-08-31 DIAGNOSIS — G8929 Other chronic pain: Secondary | ICD-10-CM | POA: Diagnosis not present

## 2022-08-31 DIAGNOSIS — E782 Mixed hyperlipidemia: Secondary | ICD-10-CM | POA: Diagnosis not present

## 2022-08-31 DIAGNOSIS — I252 Old myocardial infarction: Secondary | ICD-10-CM | POA: Diagnosis not present

## 2022-08-31 DIAGNOSIS — I119 Hypertensive heart disease without heart failure: Secondary | ICD-10-CM | POA: Diagnosis not present

## 2022-08-31 DIAGNOSIS — M5116 Intervertebral disc disorders with radiculopathy, lumbar region: Secondary | ICD-10-CM | POA: Diagnosis not present

## 2022-08-31 DIAGNOSIS — I2510 Atherosclerotic heart disease of native coronary artery without angina pectoris: Secondary | ICD-10-CM | POA: Diagnosis not present

## 2022-09-05 ENCOUNTER — Telehealth: Payer: Self-pay | Admitting: Internal Medicine

## 2022-09-05 DIAGNOSIS — E782 Mixed hyperlipidemia: Secondary | ICD-10-CM | POA: Diagnosis not present

## 2022-09-05 DIAGNOSIS — I2510 Atherosclerotic heart disease of native coronary artery without angina pectoris: Secondary | ICD-10-CM | POA: Diagnosis not present

## 2022-09-05 DIAGNOSIS — Z7902 Long term (current) use of antithrombotics/antiplatelets: Secondary | ICD-10-CM | POA: Diagnosis not present

## 2022-09-05 DIAGNOSIS — M1990 Unspecified osteoarthritis, unspecified site: Secondary | ICD-10-CM | POA: Diagnosis not present

## 2022-09-05 DIAGNOSIS — M5116 Intervertebral disc disorders with radiculopathy, lumbar region: Secondary | ICD-10-CM | POA: Diagnosis not present

## 2022-09-05 DIAGNOSIS — I739 Peripheral vascular disease, unspecified: Secondary | ICD-10-CM | POA: Diagnosis not present

## 2022-09-05 DIAGNOSIS — I252 Old myocardial infarction: Secondary | ICD-10-CM | POA: Diagnosis not present

## 2022-09-05 DIAGNOSIS — I119 Hypertensive heart disease without heart failure: Secondary | ICD-10-CM | POA: Diagnosis not present

## 2022-09-05 DIAGNOSIS — G8929 Other chronic pain: Secondary | ICD-10-CM | POA: Diagnosis not present

## 2022-09-05 MED ORDER — AMLODIPINE BESYLATE 10 MG PO TABS: 10.0000 mg | ORAL_TABLET | Freq: Every day | ORAL | 3 refills | Status: DC

## 2022-09-05 NOTE — Telephone Encounter (Signed)
Reviewed blood pressures and questions from home health nurse with Dr. Gasper Sells (DOD).  Patient can increase amlodipine to '10mg'$  daily.  Spoke with home health nurse, Mickel Baas to relay new medication orders.

## 2022-09-05 NOTE — Addendum Note (Signed)
Addended by: Vergia Alcon A on: 09/05/2022 12:52 PM   Modules accepted: Orders

## 2022-09-05 NOTE — Telephone Encounter (Signed)
Pt c/o BP issue: STAT if pt c/o blurred vision, one-sided weakness or slurred speech  1. What are your last 5 BP readings?  09/05/22 156/74 BP running in the 160's in the evening   2. Are you having any other symptoms (ex. Dizziness, headache, blurred vision, passed out)? No   3. What is your BP issue? Mickel Baas with [facility] is calling to report the patient's BP today was 156/74. Patient has been checking it for them in the evening and reports it getting up into the 160's. Amlodipine was decreased to 5 MG in the hospital, they are questing if increase back to 10 mg should be done. Please advise.

## 2022-09-10 ENCOUNTER — Ambulatory Visit (HOSPITAL_COMMUNITY): Payer: Medicare Other | Attending: Neurology

## 2022-09-10 DIAGNOSIS — I2510 Atherosclerotic heart disease of native coronary artery without angina pectoris: Secondary | ICD-10-CM | POA: Diagnosis not present

## 2022-09-10 DIAGNOSIS — M542 Cervicalgia: Secondary | ICD-10-CM | POA: Diagnosis not present

## 2022-09-10 DIAGNOSIS — I361 Nonrheumatic tricuspid (valve) insufficiency: Secondary | ICD-10-CM

## 2022-09-10 DIAGNOSIS — I10 Essential (primary) hypertension: Secondary | ICD-10-CM | POA: Insufficient documentation

## 2022-09-10 DIAGNOSIS — R202 Paresthesia of skin: Secondary | ICD-10-CM | POA: Insufficient documentation

## 2022-09-10 DIAGNOSIS — G459 Transient cerebral ischemic attack, unspecified: Secondary | ICD-10-CM | POA: Insufficient documentation

## 2022-09-10 DIAGNOSIS — I252 Old myocardial infarction: Secondary | ICD-10-CM | POA: Diagnosis not present

## 2022-09-10 DIAGNOSIS — M5116 Intervertebral disc disorders with radiculopathy, lumbar region: Secondary | ICD-10-CM | POA: Diagnosis not present

## 2022-09-10 DIAGNOSIS — I119 Hypertensive heart disease without heart failure: Secondary | ICD-10-CM | POA: Diagnosis not present

## 2022-09-10 DIAGNOSIS — E785 Hyperlipidemia, unspecified: Secondary | ICD-10-CM | POA: Insufficient documentation

## 2022-09-10 DIAGNOSIS — G8929 Other chronic pain: Secondary | ICD-10-CM | POA: Insufficient documentation

## 2022-09-10 DIAGNOSIS — Z7902 Long term (current) use of antithrombotics/antiplatelets: Secondary | ICD-10-CM | POA: Diagnosis not present

## 2022-09-10 DIAGNOSIS — I739 Peripheral vascular disease, unspecified: Secondary | ICD-10-CM | POA: Diagnosis not present

## 2022-09-10 DIAGNOSIS — E782 Mixed hyperlipidemia: Secondary | ICD-10-CM | POA: Diagnosis not present

## 2022-09-10 DIAGNOSIS — M1990 Unspecified osteoarthritis, unspecified site: Secondary | ICD-10-CM | POA: Diagnosis not present

## 2022-09-10 LAB — ECHOCARDIOGRAM COMPLETE
Area-P 1/2: 3.06 cm2
S' Lateral: 2.7 cm

## 2022-09-17 ENCOUNTER — Telehealth: Payer: Self-pay | Admitting: Family Medicine

## 2022-09-17 DIAGNOSIS — E782 Mixed hyperlipidemia: Secondary | ICD-10-CM | POA: Diagnosis not present

## 2022-09-17 DIAGNOSIS — M542 Cervicalgia: Secondary | ICD-10-CM

## 2022-09-17 DIAGNOSIS — M1990 Unspecified osteoarthritis, unspecified site: Secondary | ICD-10-CM | POA: Diagnosis not present

## 2022-09-17 DIAGNOSIS — I739 Peripheral vascular disease, unspecified: Secondary | ICD-10-CM | POA: Diagnosis not present

## 2022-09-17 DIAGNOSIS — I119 Hypertensive heart disease without heart failure: Secondary | ICD-10-CM | POA: Diagnosis not present

## 2022-09-17 DIAGNOSIS — M5412 Radiculopathy, cervical region: Secondary | ICD-10-CM

## 2022-09-17 DIAGNOSIS — M5116 Intervertebral disc disorders with radiculopathy, lumbar region: Secondary | ICD-10-CM | POA: Diagnosis not present

## 2022-09-17 DIAGNOSIS — Z7902 Long term (current) use of antithrombotics/antiplatelets: Secondary | ICD-10-CM | POA: Diagnosis not present

## 2022-09-17 DIAGNOSIS — G8929 Other chronic pain: Secondary | ICD-10-CM | POA: Diagnosis not present

## 2022-09-17 DIAGNOSIS — I2510 Atherosclerotic heart disease of native coronary artery without angina pectoris: Secondary | ICD-10-CM | POA: Diagnosis not present

## 2022-09-17 DIAGNOSIS — I252 Old myocardial infarction: Secondary | ICD-10-CM | POA: Diagnosis not present

## 2022-09-17 NOTE — Telephone Encounter (Signed)
Patient called to find out results of MRI that she had done on 09/03/22. Advised patient I see the order from Dr. Charlett Blake but not that the order has been done. Patient said she got the mri done at [facility] imaging. Gave her their number to their office to follow up since it does not appear we have the results from that test.

## 2022-09-20 DIAGNOSIS — H1032 Unspecified acute conjunctivitis, left eye: Secondary | ICD-10-CM | POA: Diagnosis not present

## 2022-09-21 NOTE — Telephone Encounter (Signed)
Patient states we should have received her MRI results from [facility]. She would like someone to call her back and go over results. Please advise.

## 2022-09-24 NOTE — Telephone Encounter (Signed)
Spoke with Larene Beach at [facility] in [place] [phone]) and requested MRI result be faxed to front office fax #.

## 2022-09-24 NOTE — Telephone Encounter (Signed)
Pt agreed to see neurosurgery and referral has been placed.  She would like to know if you could put in a referral for PT?

## 2022-09-24 NOTE — Telephone Encounter (Signed)
Can you take a look in care everywhere at her results from [facility] for her MRI.  It is dated 08/30/22 but was done on 09/03/22. MRI cervical spine.

## 2022-09-24 NOTE — Addendum Note (Signed)
Addended by: Kem Boroughs D on: 09/24/2022 03:17 PM   Modules accepted: Orders

## 2022-09-25 ENCOUNTER — Telehealth: Payer: Self-pay | Admitting: Family Medicine

## 2022-09-25 DIAGNOSIS — I2510 Atherosclerotic heart disease of native coronary artery without angina pectoris: Secondary | ICD-10-CM | POA: Diagnosis not present

## 2022-09-25 DIAGNOSIS — G8929 Other chronic pain: Secondary | ICD-10-CM | POA: Diagnosis not present

## 2022-09-25 DIAGNOSIS — I119 Hypertensive heart disease without heart failure: Secondary | ICD-10-CM | POA: Diagnosis not present

## 2022-09-25 DIAGNOSIS — M1990 Unspecified osteoarthritis, unspecified site: Secondary | ICD-10-CM | POA: Diagnosis not present

## 2022-09-25 DIAGNOSIS — I739 Peripheral vascular disease, unspecified: Secondary | ICD-10-CM | POA: Diagnosis not present

## 2022-09-25 DIAGNOSIS — I252 Old myocardial infarction: Secondary | ICD-10-CM | POA: Diagnosis not present

## 2022-09-25 DIAGNOSIS — M5116 Intervertebral disc disorders with radiculopathy, lumbar region: Secondary | ICD-10-CM | POA: Diagnosis not present

## 2022-09-25 DIAGNOSIS — Z7902 Long term (current) use of antithrombotics/antiplatelets: Secondary | ICD-10-CM | POA: Diagnosis not present

## 2022-09-25 DIAGNOSIS — E782 Mixed hyperlipidemia: Secondary | ICD-10-CM | POA: Diagnosis not present

## 2022-09-25 NOTE — Telephone Encounter (Signed)
Patient will wait until seen by neurosurgeon.

## 2022-09-25 NOTE — Telephone Encounter (Signed)
Left message on machine that we are sending patient to neurosurgery and maybe the reason she does not want in home PT.

## 2022-09-25 NOTE — Telephone Encounter (Signed)
[name] [facility])  called stating that pt would like outpatient PT as she is no longer homebound.

## 2022-10-10 NOTE — Progress Notes (Unsigned)
Cardiology Office Note   Date:  10/11/2022   ID:  [name], DOB 1946-11-29, MRN [id number]  PCP:  Mosie Lukes, MD  Cardiologist:   Dorris Carnes, MD   F/U of CAD    History of Present Illness:  [name] is a 76 y.o. female with CAD - s/p NSTEMI 10/2011 with DESx2   Other issues include thyroidectomy, GERD, HTN, HLD, vertigo.  Last myovue showed inferolateral scar with minimal periinfarct ischemia   L heart cath in Dec 2019 showed:  LAD with 25%  OM  with 100% with Left to R  collaterals  RCA with patent stent   Normal LVEF    Echo in early Sept 2020   LVEF and RVEF were normal   Mild diastolic dysfunction    I saw the pt in [facility] in May 2023    Since seen the patient has had occsaional CP  She is very stressed with all the violence in the [place] where she has family    The other night she was watching the news late   Developed CP   NTG went away  Had again  Took NTG   Went away   [place] 911   EMS came  EKG done   No changes    Did not go to ED    She has occsaional CP at other times but notes no progression       She called in since seen with BP elevation   Amlodipine increased to 10 mg    Had some numbness on L face   Seen in neuro    ? TIA  Echo done   Normal   Carotid USN without severe dz   Symptoms felt to be due to DJD of neck     The pt says her breathing is OK  No CP now     Current Meds  Medication Sig   aspirin EC 81 MG tablet Take 81 mg by mouth daily.   B Complex-C (B-COMPLEX WITH VITAMIN C) tablet Take 1 tablet by mouth daily.   calcitRIOL (ROCALTROL) 0.25 MCG capsule Take 3 capsules (0.75 mcg total) by mouth daily.   Cholecalciferol (VITAMIN D-3) 25 MCG (1000 UT) CAPS Take 3000-4000 units by mouth daily   furosemide (LASIX) 20 MG tablet TAKE 1 TABLET BY MOUTH ONCE WEEKLY AS NEEDED   levothyroxine (SYNTHROID) 112 MCG tablet TAKE 1 TABLET BY MOUTH DAILY  BEFORE BREAKFAST   Multiple Vitamin (MULTIVITAMIN WITH MINERALS) TABS tablet Take 1 tablet  by mouth daily.   pantoprazole (PROTONIX) 40 MG tablet TAKE 1 TABLET BY MOUTH  DAILY   REPATHA SURECLICK 381 MG/ML SOAJ INJECT 140MG SUBCUTANEOUSLY EVERY 2 WEEKS   rosuvastatin (CRESTOR) 40 MG tablet TAKE 1 TABLET BY MOUTH DAILY   [DISCONTINUED] amLODipine (NORVASC) 10 MG tablet Take 1 tablet (10 mg total) by mouth daily.   [DISCONTINUED] bisoprolol (ZEBETA) 5 MG tablet TAKE 1 TABLET BY MOUTH DAILY   [DISCONTINUED] clopidogrel (PLAVIX) 75 MG tablet Take 1 tablet (75 mg total) by mouth daily.   [DISCONTINUED] isosorbide mononitrate (IMDUR) 60 MG 24 hr tablet Take 1 tablet (60 mg total) by mouth daily.   [DISCONTINUED] nitroGLYCERIN (NITROSTAT) 0.4 MG SL tablet DISSOLVE 1 TABLET UNDER THE TONGUE EVERY 5 MINUTES AS  NEEDED FOR CHEST PAIN. MAX  OF 3 TABLETS IN 15 MINUTES. CALL 911 IF PAIN PERSISTS.     Allergies:   Zetia [ezetimibe] and Penicillin g   Past  Medical History:  Diagnosis Date   Acute MI, subendocardial ([place])    Arthritis    Back pain 05/31/2013   Benign paroxysmal positional vertigo 08/05/2016   CAD (coronary artery disease)    a. NSTEMI 10/2011: [place] 11/19/11: pLAD 30%, oOM 60%, AVCFX 30%, CFX after OM2 30%, pRCA 60 and 70%, then 99%, AM 80-90% with TIMI 3 flow.  PCI: Promus DES x 2 to RCA; b. 06/2012 Cath: patent RCA stents w/ subtl occl of Acute Marginal (jailed)->Med rx; c. 05/2015 MV: EF 59%, mod mid infsept/inf/ap lat/ap infarct with peri-infarct isch-->Med Rx; d. 02/2016 Cath: LM nl, LAD 83m RI 50, RCA patent stents.   Colitis    Facial skin lesion 01/28/2017   GERD (gastroesophageal reflux disease)    occasional   Hyperlipidemia    Hypertension    Hypertensive heart disease    a. Echocardiogram 11/19/11: Difficult acoustic windows, EF 60-65%, normal LV wall thickness, grade 1 diastolic dysfunction   Hypoparathyroidism (HCC)    Low zinc level 11/26/2016   Multinodular thyroid    Goiter s/p thyroidectomy in 2007 with post-op  hypocalcemia and post-op  hypothyroidism/hypoparathyroidism with hypocalcemia   Neck pain on right side 07/13/2013   Nocturia 05/31/2013   Osteoarthritis    Pain in right axilla 03/13/2016   Palpitations    a. 03/2012 - patient set up for event monitor but did not wear correctly -  she declined wearing a repeat monitor   Post-surgical hypothyroidism    Sun-damaged skin 12/05/2014   Tubular adenoma of colon 06/2011   Unspecified constipation 05/31/2013   Vertigo    Zinc deficiency 11/26/2015    Past Surgical History:  Procedure Laterality Date   CARDIAC CATHETERIZATION N/A 03/08/2016   Procedure: Left Heart Cath and Coronary Angiography;  Surgeon: JJettie Booze MD;  [facility];  Service: Cardiovascular;  Laterality: N/A;   COLONOSCOPY  Aenomatous polyps   07/05/2011   COLONOSCOPY N/A 09/28/2014   Procedure: COLONOSCOPY;  Surgeon: MLadene Artist MD;  [facility];  Service: Endoscopy;  Laterality: N/A;   CORONARY ANGIOPLASTY WITH STENT PLACEMENT     LEFT HEART CATH AND CORONARY ANGIOGRAPHY N/A 12/09/2018   Procedure: LEFT HEART CATH AND CORONARY ANGIOGRAPHY;  Surgeon: VJettie Booze MD;  [facility];  Service: Cardiovascular;  Laterality: N/A;   LEFT HEART CATHETERIZATION WITH CORONARY ANGIOGRAM N/A 07/17/2012   Procedure: LEFT HEART CATHETERIZATION WITH CORONARY ANGIOGRAM;  Surgeon: THillary Bow MD;  [facility];  Service: Cardiovascular;  Laterality: N/A;   PERCUTANEOUS CORONARY STENT INTERVENTION (PCI-S) N/A 11/19/2011   Procedure: PERCUTANEOUS CORONARY STENT INTERVENTION (PCI-S);  Surgeon: THillary Bow MD;  [facility];  Service: Cardiovascular;  Laterality: N/A;   POLYPECTOMY     SHOULDER ARTHROSCOPY W/ ROTATOR CUFF REPAIR     right   TOTAL THYROIDECTOMY  2007   GOITER     Social History:  The patient  reports that she has never smoked. She has never used smokeless tobacco. She reports that she does not currently use  alcohol. She reports that she does not use drugs.   Family History:  The patient's family history includes Blindness in her sister; Cancer in her brother and brother; Colon cancer in her brother; Congestive Heart Failure in her sister; Healthy in her daughter, daughter, daughter, and son; Heart attack in her father; Heart disease in her father; Hypertension in her father and sister; Thyroid disease in her sister.  ROS:  Please see the history of present illness. All other systems are reviewed and  Negative to the above problem except as noted.    PHYSICAL EXAM: VS:  BP 100/60   Pulse (!) 59   Ht _0  (1.651 m)   Wt 191 lb 3.2 oz (86.7 kg)   SpO2 95%   BMI 31.82 kg/m   GEN: Well nourished, well developed, in no acute distress  HEENT: normal  Neck: JVP is normal  No bruits Cardiac: RRR; no murmurs,   No LE  edema  Respiratory:  clear to auscultation bilaterally GI: soft, nontender, nondistended, + BS  No hepatomegaly  MS: no deformity Moving all extremities   Skin: warm and dry Neuro:  Strength and sensation are intact Psych: euthymic mood, full affect   EKG:  EKG is ordered today.  SB 59  RBBB   Echo   Sept 2023   1. Left ventricular ejection fraction, by estimation, is 60 to 65%. The  left ventricle has normal function. The left ventricle has no regional  wall motion abnormalities. There is mild left ventricular hypertrophy.  Left ventricular diastolic parameters  were normal.   2. Right ventricular systolic function is normal. The right ventricular  size is normal.   3. The mitral valve is abnormal. Trivial mitral valve regurgitation. No  evidence of mitral stenosis.   4. The aortic valve is tricuspid. Aortic valve regurgitation is trivial.  No aortic stenosis is present.   5. The inferior vena cava is normal in size with greater than 50%  respiratory variability, suggesting right atrial pressure of 3 mmHg.    Lipid Panel    Component Value Date/Time   CHOL 195  08/21/2022 1016   CHOL 195 01/01/2022 0832   TRIG 274.0 (H) 08/21/2022 1016   HDL 46.10 08/21/2022 1016   HDL 46 01/01/2022 0832   CHOLHDL 4 08/21/2022 1016   VLDL 54.8 (H) 08/21/2022 1016   LDLCALC 109 (H) 01/01/2022 0832   LDLDIRECT 106.0 08/21/2022 1016      Wt Readings from Last 3 Encounters:  10/11/22 191 lb 3.2 oz (86.7 kg)  08/28/22 186 lb (84.4 kg)  08/21/22 192 lb (87.1 kg)      ASSESSMENT AND PLAN:  1  CAD  Pt has had a coupe episodes of pain   No progression   Last couple associated with mental stress   I encouraged her to not watch news late at night     2   HTN   BP is 100  She denies symptims   I did back down on her amlodipine t o5 in past because of dizziness   Need to follow      3  HL   HL   Keep on [id number]    Trig high   Will check lipids     Repeat ESR and CRP (had been elevated )     Follow up in 3 months    Current medicines are reviewed at length with the patient today.  The patient does not have concerns regarding medicines.  Signed, Dorris Carnes, MD  10/11/2022 10:22 AM    [facility] [place], [place], [place]  [address] Phone: [phone]; Fax: [phone]

## 2022-10-11 ENCOUNTER — Encounter: Payer: Self-pay | Admitting: Internal Medicine

## 2022-10-11 ENCOUNTER — Ambulatory Visit: Payer: Medicare Other | Attending: Internal Medicine | Admitting: Internal Medicine

## 2022-10-11 DIAGNOSIS — R079 Chest pain, unspecified: Secondary | ICD-10-CM

## 2022-10-11 DIAGNOSIS — M4722 Other spondylosis with radiculopathy, cervical region: Secondary | ICD-10-CM | POA: Diagnosis not present

## 2022-10-11 DIAGNOSIS — I2510 Atherosclerotic heart disease of native coronary artery without angina pectoris: Secondary | ICD-10-CM

## 2022-10-11 MED ORDER — NITROGLYCERIN 0.4 MG SL SUBL: SUBLINGUAL_TABLET | SUBLINGUAL | 2 refills | Status: DC

## 2022-10-11 MED ORDER — ISOSORBIDE MONONITRATE ER 60 MG PO TB24: 60.0000 mg | ORAL_TABLET | Freq: Every day | ORAL | 3 refills | Status: DC

## 2022-10-11 MED ORDER — CLOPIDOGREL BISULFATE 75 MG PO TABS: 75.0000 mg | ORAL_TABLET | Freq: Every day | ORAL | 3 refills | Status: DC

## 2022-10-11 MED ORDER — AMLODIPINE BESYLATE 10 MG PO TABS: 10.0000 mg | ORAL_TABLET | Freq: Every day | ORAL | 3 refills | Status: DC

## 2022-10-11 MED ORDER — BISOPROLOL FUMARATE 5 MG PO TABS: 5.0000 mg | ORAL_TABLET | Freq: Every day | ORAL | 3 refills | Status: DC

## 2022-10-11 NOTE — Patient Instructions (Addendum)
Medication Instructions:   *If you need a refill on your cardiac medications before your next appointment, please call your pharmacy*   Lab Work: CRP, SED RATE, LIPID   If you have labs (blood work) drawn today and your tests are completely normal, you will receive your results only by: [place] (if you have MyChart) OR A paper copy in the mail If you have any lab test that is abnormal or we need to change your treatment, we will call you to review the results.   Testing/Procedures:    Follow-Up: At [facility], you and your health needs are our priority.  As part of our continuing mission to provide you with exceptional heart care, we have created designated Provider Care Teams.  These Care Teams include your primary Cardiologist (physician) and Advanced Practice Providers (APPs -  Physician Assistants and Nurse Practitioners) who all work together to provide you with the care you need, when you need it.  We recommend signing up for the patient portal called "MyChart".  Sign up information is provided on this After Visit Summary.  MyChart is used to connect with patients for Virtual Visits (Telemedicine).  Patients are able to view lab/test results, encounter notes, upcoming appointments, etc.  Non-urgent messages can be sent to your provider as well.   To learn more about what you can do with MyChart, go to [email or web address].    Your next appointment:   3 month(s)  The format for your next appointment:   In Person  Provider:   Dorris Carnes, MD     Other Instructions   Important Information About Sugar

## 2022-10-12 LAB — LIPID PANEL
Chol/HDL Ratio: 4 ratio (ref 0.0–4.4)
Cholesterol, Total: 172 mg/dL (ref 100–199)
HDL: 43 mg/dL (ref >39)
LDL Chol Calc (NIH): 101 mg/dL — ABNORMAL HIGH (ref 0–99)
Triglycerides: 157 mg/dL — ABNORMAL HIGH (ref 0–149)
VLDL Cholesterol Cal: 28 mg/dL (ref 5–40)

## 2022-10-12 LAB — C-REACTIVE PROTEIN: CRP: 7 mg/L (ref 0–10)

## 2022-10-12 LAB — SEDIMENTATION RATE: Sed Rate: 4 mm/hr (ref 0–40)

## 2022-10-17 ENCOUNTER — Telehealth: Payer: Self-pay

## 2022-10-17 MED ORDER — AMLODIPINE BESYLATE 10 MG PO TABS: 10.0000 mg | ORAL_TABLET | Freq: Every day | ORAL | 3 refills | Status: DC

## 2022-10-17 NOTE — Telephone Encounter (Signed)
-----   Message from [place], MD sent at 10/15/2022  6:05 PM EDT ----- Inflammation markers (CRP, Sedimentation rate ) are both normal Lipids:  LDL is too high   101     She is on Crestor   Did not tolerate Zetia  Confirm Repatha  She had been on this earlier this year

## 2022-10-17 NOTE — Telephone Encounter (Signed)
Pt reports that she has been eating eggs much more often lately and not sure if this explains her increase in her Lipids...Marland KitchenMarland Kitchen she says she is still using the [place].   Will forward to Dr Harrington Challenger.

## 2022-10-21 NOTE — Telephone Encounter (Signed)
Follow up lipomed in 3 months

## 2022-10-22 NOTE — Telephone Encounter (Signed)
My Chart message sent to the pt and we can recheck her labs at her OV 01/14/23.

## 2022-10-25 DIAGNOSIS — H04123 Dry eye syndrome of bilateral lacrimal glands: Secondary | ICD-10-CM | POA: Diagnosis not present

## 2022-10-26 ENCOUNTER — Ambulatory Visit: Payer: Self-pay | Admitting: Physical Therapy

## 2022-10-26 ENCOUNTER — Telehealth: Payer: Self-pay | Admitting: Family Medicine

## 2022-10-26 ENCOUNTER — Other Ambulatory Visit: Payer: Self-pay

## 2022-10-26 MED ORDER — FUROSEMIDE 20 MG PO TABS: ORAL_TABLET | ORAL | 2 refills | Status: DC

## 2022-10-26 NOTE — Telephone Encounter (Signed)
Medication sent.

## 2022-10-26 NOTE — Telephone Encounter (Signed)
Medication: furosemide (LASIX) 20 MG tablet   Has the patient contacted their pharmacy? Yes.   No refills / patient states she's completely out  Preferred Pharmacy (with phone number or street name):  [facility] [place], [address] AT [facility] [address], [place] [address] Phone: [phone]  Fax: [phone]

## 2022-10-29 ENCOUNTER — Ambulatory Visit (HOSPITAL_BASED_OUTPATIENT_CLINIC_OR_DEPARTMENT_OTHER)
Admission: RE | Admit: 2022-10-29 | Discharge: 2022-10-29 | Disposition: A | Discharge status: Home / Self Care | Payer: Medicare Other | Source: Ambulatory Visit | Attending: Medical | Admitting: Medical

## 2022-10-29 ENCOUNTER — Other Ambulatory Visit (HOSPITAL_BASED_OUTPATIENT_CLINIC_OR_DEPARTMENT_OTHER): Payer: Self-pay | Admitting: Family Medicine

## 2022-10-29 ENCOUNTER — Ambulatory Visit (INDEPENDENT_AMBULATORY_CARE_PROVIDER_SITE_OTHER): Payer: Medicare Other | Admitting: Medical

## 2022-10-29 VITALS — BP 130/64 | HR 60 | Temp 98.0°F | Resp 18 | Ht 65.0 in | Wt 190.0 lb

## 2022-10-29 DIAGNOSIS — J811 Chronic pulmonary edema: Secondary | ICD-10-CM | POA: Diagnosis not present

## 2022-10-29 DIAGNOSIS — R609 Edema, unspecified: Secondary | ICD-10-CM

## 2022-10-29 DIAGNOSIS — Z1231 Encounter for screening mammogram for malignant neoplasm of breast: Secondary | ICD-10-CM

## 2022-10-29 DIAGNOSIS — J984 Other disorders of lung: Secondary | ICD-10-CM | POA: Diagnosis not present

## 2022-10-29 MED ORDER — FUROSEMIDE 20 MG PO TABS: ORAL_TABLET | ORAL | 0 refills | Status: DC

## 2022-10-29 NOTE — Progress Notes (Signed)
Subjective:    Patient ID: [name], female    DOB: [birth year], 76 y.o.   MRN: [id number]  HPI  Pt in for  swelling in both lateral ankes and her feet. This has been present in the past only last week. No injury or fall.   Pt states last Friday swelling was so severe she describes not able to plantar flex or dorsi. Pt states swelling all day on Saturday. By this Sunday was improving.  No shortness of breath walking or lying supine. No weight gain in past week.      Review of Systems  Constitutional:  Negative for chills, fatigue and fever.  Respiratory:  Negative for cough, chest tightness, shortness of breath and wheezing.   Cardiovascular:  Negative for chest pain and palpitations.  Gastrointestinal:  Negative for abdominal pain.  Musculoskeletal:        Pedal edema  Skin:  Positive for rash.    Past Medical History:  Diagnosis Date   Acute MI, subendocardial ([place])    Arthritis    Back pain 05/31/2013   Benign paroxysmal positional vertigo 08/05/2016   CAD (coronary artery disease)    a. NSTEMI 10/2011: [place] 11/19/11: pLAD 30%, oOM 60%, AVCFX 30%, CFX after OM2 30%, pRCA 60 and 70%, then 99%, AM 80-90% with TIMI 3 flow.  PCI: Promus DES x 2 to RCA; b. 06/2012 Cath: patent RCA stents w/ subtl occl of Acute Marginal (jailed)->Med rx; c. 05/2015 MV: EF 59%, mod mid infsept/inf/ap lat/ap infarct with peri-infarct isch-->Med Rx; d. 02/2016 Cath: LM nl, LAD 50m RI 50, RCA patent stents.   Colitis    Facial skin lesion 01/28/2017   GERD (gastroesophageal reflux disease)    occasional   Hyperlipidemia    Hypertension    Hypertensive heart disease    a. Echocardiogram 11/19/11: Difficult acoustic windows, EF 60-65%, normal LV wall thickness, grade 1 diastolic dysfunction   Hypoparathyroidism (HCC)    Low zinc level 11/26/2016   Multinodular thyroid    Goiter s/p thyroidectomy in 2007 with post-op  hypocalcemia and post-op hypothyroidism/hypoparathyroidism with hypocalcemia    Neck pain on right side 07/13/2013   Nocturia 05/31/2013   Osteoarthritis    Pain in right axilla 03/13/2016   Palpitations    a. 03/2012 - patient set up for event monitor but did not wear correctly -  she declined wearing a repeat monitor   Post-surgical hypothyroidism    Sun-damaged skin 12/05/2014   Tubular adenoma of colon 06/2011   Unspecified constipation 05/31/2013   Vertigo    Zinc deficiency 11/26/2015     Social History   Socioeconomic History   Marital status: Married    Spouse name: Not on file   Number of children: 4   Years of education: 14   Highest education level: Not on file  Occupational History   Occupation: SPress photographerperson at [employer]   Smoking status: Never   Smokeless tobacco: Never  Vaping Use   Vaping Use: Never used  Substance and Sexual Activity   Alcohol use: Not Currently   Drug use: No   Sexual activity: Not Currently  Other Topics Concern   Not on file  Social History Narrative   Lives at home alone.  Her son lives near her.   Right-handed.   1 cup coffee per day.   Social Determinants of Health   Financial Resource Strain: Low Risk  (05/07/2022)   Overall Financial Resource Strain (CARDIA)  Difficulty of Paying Living Expenses: Not hard at all  Food Insecurity: No Food Insecurity (05/07/2022)   Hunger Vital Sign    Worried About Running Out of Food in the Last Year: Never true    Ran Out of Food in the Last Year: Never true  Transportation Needs: No Transportation Needs (05/10/2022)   PRAPARE - Hydrologist (Medical): No    Lack of Transportation (Non-Medical): No  Recent Concern: Transportation Needs - Unmet Transportation Needs (05/07/2022)   PRAPARE - Hydrologist (Medical): Yes    Lack of Transportation (Non-Medical): No  Physical Activity: Sufficiently Active (05/07/2022)   Exercise Vital Sign    Days of Exercise per Week: 7 days    Minutes of Exercise per Session: 30  min  Stress: No Stress Concern Present (05/07/2022)   [place]    Feeling of Stress : Not at all  Social Connections: Not on file  Intimate Partner Violence: Not At Risk (05/07/2022)   Humiliation, Afraid, Rape, and Kick questionnaire    Fear of Current or Ex-Partner: No    Emotionally Abused: No    Physically Abused: No    Sexually Abused: No    Past Surgical History:  Procedure Laterality Date   CARDIAC CATHETERIZATION N/A 03/08/2016   Procedure: Left Heart Cath and Coronary Angiography;  Surgeon: Jettie Booze, MD;  Location: [facility];  Service: Cardiovascular;  Laterality: N/A;   COLONOSCOPY  Aenomatous polyps   07/05/2011   COLONOSCOPY N/A 09/28/2014   Procedure: COLONOSCOPY;  Surgeon: Ladene Artist, MD;  Location: [facility];  Service: Endoscopy;  Laterality: N/A;   CORONARY ANGIOPLASTY WITH STENT PLACEMENT     LEFT HEART CATH AND CORONARY ANGIOGRAPHY N/A 12/09/2018   Procedure: LEFT HEART CATH AND CORONARY ANGIOGRAPHY;  Surgeon: Jettie Booze, MD;  Location: [facility];  Service: Cardiovascular;  Laterality: N/A;   LEFT HEART CATHETERIZATION WITH CORONARY ANGIOGRAM N/A 07/17/2012   Procedure: LEFT HEART CATHETERIZATION WITH CORONARY ANGIOGRAM;  Surgeon: Hillary Bow, MD;  Location: [facility];  Service: Cardiovascular;  Laterality: N/A;   PERCUTANEOUS CORONARY STENT INTERVENTION (PCI-S) N/A 11/19/2011   Procedure: PERCUTANEOUS CORONARY STENT INTERVENTION (PCI-S);  Surgeon: Hillary Bow, MD;  Location: [facility];  Service: Cardiovascular;  Laterality: N/A;   POLYPECTOMY     SHOULDER ARTHROSCOPY W/ ROTATOR CUFF REPAIR     right   TOTAL THYROIDECTOMY  2007   GOITER    Family History  Problem Relation Age of Onset   Colon cancer Brother    Cancer Brother        COLON   Hypertension Father    Heart disease Father    Heart attack Father    Blindness Sister     Congestive Heart Failure Sister    Hypertension Sister    Healthy Daughter    Healthy Son    Thyroid disease Sister    Cancer Brother        multiple myelomas   Healthy Daughter    Healthy Daughter    Diabetes Neg Hx    Prostate cancer Neg Hx    Breast cancer Neg Hx    Esophageal cancer Neg Hx    Rectal cancer Neg Hx    Stomach cancer Neg Hx     Allergies  Allergen Reactions   Zetia [Ezetimibe] Other (See Comments)    Myalgia, paresthesias  and weakness   Penicillin G Rash    Current Outpatient Medications on File Prior to Visit  Medication Sig Dispense Refill   amLODipine (NORVASC) 10 MG tablet Take 1 tablet (10 mg total) by mouth daily. 90 tablet 3   aspirin EC 81 MG tablet Take 81 mg by mouth daily.     B Complex-C (B-COMPLEX WITH VITAMIN C) tablet Take 1 tablet by mouth daily.     bisoprolol (ZEBETA) 5 MG tablet Take 1 tablet (5 mg total) by mouth daily. 100 tablet 3   calcitRIOL (ROCALTROL) 0.25 MCG capsule Take 3 capsules (0.75 mcg total) by mouth daily. 270 capsule 3   Cholecalciferol (VITAMIN D-3) 25 MCG (1000 UT) CAPS Take 3000-4000 units by mouth daily 60 capsule 0   clopidogrel (PLAVIX) 75 MG tablet Take 1 tablet (75 mg total) by mouth daily. 90 tablet 3   diazepam (VALIUM) 5 MG tablet Take 1 tablet (5 mg total) by mouth daily as needed for anxiety (MRI). Take 1 one hour prior to MRI and another when arrive (Patient not taking: Reported on 10/11/2022) 10 tablet 0   isosorbide mononitrate (IMDUR) 60 MG 24 hr tablet Take 1 tablet (60 mg total) by mouth daily. 90 tablet 3   levothyroxine (SYNTHROID) 112 MCG tablet TAKE 1 TABLET BY MOUTH DAILY  BEFORE BREAKFAST 100 tablet 2   methylPREDNISolone (MEDROL DOSEPAK) 4 MG TBPK tablet Follow package insert (Patient not taking: Reported on 10/11/2022) 21 each 0   Multiple Vitamin (MULTIVITAMIN WITH MINERALS) TABS tablet Take 1 tablet by mouth daily.     nitroGLYCERIN (NITROSTAT) 0.4 MG SL tablet DISSOLVE 1 TABLET UNDER THE TONGUE  EVERY 5 MINUTES AS  NEEDED FOR CHEST PAIN. MAX  OF 3 TABLETS IN 15 MINUTES. CALL 911 IF PAIN PERSISTS. 75 tablet 2   ondansetron (ZOFRAN) 4 MG tablet Take 1 tablet (4 mg total) by mouth every 6 (six) hours. (Patient not taking: Reported on 10/11/2022) 12 tablet 0   oxyCODONE (ROXICODONE) 5 MG immediate release tablet Take 1 tablet (5 mg total) by mouth every 6 (six) hours as needed for up to 20 doses for breakthrough pain. (Patient not taking: Reported on 10/11/2022) 20 tablet 0   pantoprazole (PROTONIX) 40 MG tablet TAKE 1 TABLET BY MOUTH  DAILY 100 tablet 2   REPATHA SURECLICK 423 MG/ML SOAJ INJECT '140MG'$  SUBCUTANEOUSLY EVERY 2 WEEKS 6 mL 3   rosuvastatin (CRESTOR) 40 MG tablet TAKE 1 TABLET BY MOUTH DAILY 90 tablet 3   No current facility-administered medications on file prior to visit.    BP 130/64   Pulse 60   Temp 98 F (36.7 C)   Resp 18   Ht '5\' 5"'$  (1.651 m)   Wt 190 lb (86.2 kg)   SpO2 100%   BMI 31.62 kg/m        Objective:   Physical Exam  General Mental Status- Alert. General Appearance- Not in acute distress.   Skin General: Color- Normal Color. Moisture- Normal Moisture.  Neck Carotid Arteries- Normal color. Moisture- Normal Moisture. No carotid bruits. No JVD.  Chest and Lung Exam Auscultation: Breath Sounds:-Normal.  Cardiovascular Auscultation:Rythm- Regular. Murmurs & Other Heart Sounds:Auscultation of the heart reveals- No Murmurs.  Neurologic Cranial Nerve exam:- CN III-XII intact(No nystagmus), symmetric smile. Strength:- 5/5 equal and symmetric strength both upper and lower extremities.   Lower ext- bilateral lower symmetric pitting edema around ankles and proximal feet.  No tenderness to palpation of calves and negative Homans' sign bilaterally.  Assessment & Plan:   Patient Instructions  Dependant edema most likely. Elevate you legs, use compression socks and limited rx of lasix.  Will get cxr, cmp and bnp. Make sure not early mild  chf  type pressentation.   Watch for any asymmetric swelling, back of knee or calf pain the get ultrasound.  Follow up as regularly scheduled with pcp or sooner if needed.    Mackie Pai, PA-C

## 2022-10-29 NOTE — Patient Instructions (Addendum)
Dependant edema most likely. Elevate you legs, use compression socks and limited rx of lasix.  Will get cxr, cmp and bnp. Make sure not early mild  chf type presentation.   Watch for any asymmetric swelling, back of knee or calf pain the get ultrasound.  Follow up as regularly scheduled with pcp or sooner if needed.

## 2022-10-30 ENCOUNTER — Ambulatory Visit (HOSPITAL_BASED_OUTPATIENT_CLINIC_OR_DEPARTMENT_OTHER): Payer: Medicare Other

## 2022-10-30 LAB — COMPREHENSIVE METABOLIC PANEL
ALT: 11 U/L (ref 0–35)
AST: 15 U/L (ref 0–37)
Albumin: 4 g/dL (ref 3.5–5.2)
Alkaline Phosphatase: 84 U/L (ref 39–117)
BUN: 23 mg/dL (ref 6–23)
CO2: 31 mEq/L (ref 19–32)
Calcium: 9 mg/dL (ref 8.4–10.5)
Chloride: 101 mEq/L (ref 96–112)
Creatinine, Ser: 1.04 mg/dL (ref 0.40–1.20)
GFR: 52.51 mL/min — ABNORMAL LOW (ref >60.00)
Glucose, Bld: 92 mg/dL (ref 70–99)
Potassium: 4.5 mEq/L (ref 3.5–5.1)
Sodium: 141 mEq/L (ref 135–145)
Total Bilirubin: 0.4 mg/dL (ref 0.2–1.2)
Total Protein: 6.9 g/dL (ref 6.0–8.3)

## 2022-10-30 LAB — BRAIN NATRIURETIC PEPTIDE: Pro B Natriuretic peptide (BNP): 120 pg/mL — ABNORMAL HIGH (ref 0.0–100.0)

## 2022-11-05 NOTE — Assessment & Plan Note (Signed)
Encourage heart healthy diet such as MIND or DASH diet, increase exercise, avoid trans fats, simple carbohydrates and processed foods, consider a krill or fish or flaxseed oil cap daily.  Tolerating Rosuvastatin 

## 2022-11-05 NOTE — Assessment & Plan Note (Signed)
Encouraged moist heat and gentle stretching as tolerated. May try NSAIDs and prescription meds as directed and report if symptoms worsen or seek immediate care 

## 2022-11-05 NOTE — Assessment & Plan Note (Signed)
On Levothyroxine, continue to monitor 

## 2022-11-05 NOTE — Assessment & Plan Note (Signed)
Well controlled, no changes to meds. Encouraged heart healthy diet such as the DASH diet and exercise as tolerated.  °

## 2022-11-06 ENCOUNTER — Telehealth: Payer: Self-pay

## 2022-11-06 ENCOUNTER — Ambulatory Visit (HOSPITAL_BASED_OUTPATIENT_CLINIC_OR_DEPARTMENT_OTHER)
Admission: RE | Admit: 2022-11-06 | Discharge: 2022-11-06 | Disposition: A | Discharge status: Home / Self Care | Payer: Medicare Other | Source: Ambulatory Visit | Attending: Family Medicine | Admitting: Family Medicine

## 2022-11-06 ENCOUNTER — Ambulatory Visit: Payer: Medicare Other | Attending: Student | Admitting: Physical Therapy

## 2022-11-06 ENCOUNTER — Ambulatory Visit (INDEPENDENT_AMBULATORY_CARE_PROVIDER_SITE_OTHER): Payer: Medicare Other | Admitting: Family Medicine

## 2022-11-06 ENCOUNTER — Encounter (HOSPITAL_BASED_OUTPATIENT_CLINIC_OR_DEPARTMENT_OTHER): Payer: Self-pay

## 2022-11-06 VITALS — BP 134/64 | HR 52 | Temp 98.0°F | Resp 16 | Ht 65.0 in | Wt 192.0 lb

## 2022-11-06 DIAGNOSIS — E569 Vitamin deficiency, unspecified: Secondary | ICD-10-CM | POA: Diagnosis not present

## 2022-11-06 DIAGNOSIS — R252 Cramp and spasm: Secondary | ICD-10-CM | POA: Diagnosis not present

## 2022-11-06 DIAGNOSIS — Z23 Encounter for immunization: Secondary | ICD-10-CM | POA: Diagnosis not present

## 2022-11-06 DIAGNOSIS — G8929 Other chronic pain: Secondary | ICD-10-CM

## 2022-11-06 DIAGNOSIS — E890 Postprocedural hypothyroidism: Secondary | ICD-10-CM

## 2022-11-06 DIAGNOSIS — E60 Dietary zinc deficiency: Secondary | ICD-10-CM

## 2022-11-06 DIAGNOSIS — I2089 Other forms of angina pectoris: Secondary | ICD-10-CM | POA: Diagnosis not present

## 2022-11-06 DIAGNOSIS — M5441 Lumbago with sciatica, right side: Secondary | ICD-10-CM | POA: Diagnosis not present

## 2022-11-06 DIAGNOSIS — Z1231 Encounter for screening mammogram for malignant neoplasm of breast: Secondary | ICD-10-CM | POA: Diagnosis not present

## 2022-11-06 DIAGNOSIS — R7982 Elevated C-reactive protein (CRP): Secondary | ICD-10-CM | POA: Diagnosis not present

## 2022-11-06 DIAGNOSIS — I10 Essential (primary) hypertension: Secondary | ICD-10-CM

## 2022-11-06 DIAGNOSIS — M549 Dorsalgia, unspecified: Secondary | ICD-10-CM | POA: Diagnosis not present

## 2022-11-06 DIAGNOSIS — E782 Mixed hyperlipidemia: Secondary | ICD-10-CM | POA: Diagnosis not present

## 2022-11-06 DIAGNOSIS — M542 Cervicalgia: Secondary | ICD-10-CM

## 2022-11-06 DIAGNOSIS — R293 Abnormal posture: Secondary | ICD-10-CM | POA: Diagnosis not present

## 2022-11-06 DIAGNOSIS — F419 Anxiety disorder, unspecified: Secondary | ICD-10-CM

## 2022-11-06 NOTE — Patient Instructions (Signed)
Arexvy, RSV, Respiratory Syncitial Virus, at pharmacyHypertension, Adult High blood pressure (hypertension) is when the force of blood pumping through the arteries is too strong. The arteries are the blood vessels that carry blood from the heart throughout the body. Hypertension forces the heart to work harder to pump blood and may cause arteries to become narrow or stiff. Untreated or uncontrolled hypertension can lead to a heart attack, heart failure, a stroke, kidney disease, and other problems. A blood pressure reading consists of a higher number over a lower number. Ideally, your blood pressure should be below 120/80. The first ("top") number is called the systolic pressure. It is a measure of the pressure in your arteries as your heart beats. The second ("bottom") number is called the diastolic pressure. It is a measure of the pressure in your arteries as the heart relaxes. What are the causes? The exact cause of this condition is not known. There are some conditions that result in high blood pressure. What increases the risk? Certain factors may make you more likely to develop high blood pressure. Some of these risk factors are under your control, including: Smoking. Not getting enough exercise or physical activity. Being overweight. Having too much fat, sugar, calories, or salt (sodium) in your diet. Drinking too much alcohol. Other risk factors include: Having a personal history of heart disease, diabetes, high cholesterol, or kidney disease. Stress. Having a family history of high blood pressure and high cholesterol. Having obstructive sleep apnea. Age. The risk increases with age. What are the signs or symptoms? High blood pressure may not cause symptoms. Very high blood pressure (hypertensive crisis) may cause: Headache. Fast or irregular heartbeats (palpitations). Shortness of breath. Nosebleed. Nausea and vomiting. Vision changes. Severe chest pain, dizziness, and  seizures. How is this diagnosed? This condition is diagnosed by measuring your blood pressure while you are seated, with your arm resting on a flat surface, your legs uncrossed, and your feet flat on the floor. The cuff of the blood pressure monitor will be placed directly against the skin of your upper arm at the level of your heart. Blood pressure should be measured at least twice using the same arm. Certain conditions can cause a difference in blood pressure between your right and left arms. If you have a high blood pressure reading during one visit or you have normal blood pressure with other risk factors, you may be asked to: Return on a different day to have your blood pressure checked again. Monitor your blood pressure at home for 1 week or longer. If you are diagnosed with hypertension, you may have other blood or imaging tests to help your health care provider understand your overall risk for other conditions. How is this treated? This condition is treated by making healthy lifestyle changes, such as eating healthy foods, exercising more, and reducing your alcohol intake. You may be referred for counseling on a healthy diet and physical activity. Your health care provider may prescribe medicine if lifestyle changes are not enough to get your blood pressure under control and if: Your systolic blood pressure is above 130. Your diastolic blood pressure is above 80. Your personal target blood pressure may vary depending on your medical conditions, your age, and other factors. Follow these instructions at home: Eating and drinking  Eat a diet that is high in fiber and potassium, and low in sodium, added sugar, and fat. An example of this eating plan is called the DASH diet. DASH stands for Dietary Approaches to Stop  Hypertension. To eat this way: Eat plenty of fresh fruits and vegetables. Try to fill one half of your plate at each meal with fruits and vegetables. Eat whole grains, such as  whole-wheat pasta, brown rice, or whole-grain bread. Fill about one fourth of your plate with whole grains. Eat or drink low-fat dairy products, such as skim milk or low-fat yogurt. Avoid fatty cuts of meat, processed or cured meats, and poultry with skin. Fill about one fourth of your plate with lean proteins, such as fish, chicken without skin, beans, eggs, or tofu. Avoid pre-made and processed foods. These tend to be higher in sodium, added sugar, and fat. Reduce your daily sodium intake. Many people with hypertension should eat less than 1,500 mg of sodium a day. Do not drink alcohol if: Your health care provider tells you not to drink. You are pregnant, may be pregnant, or are planning to become pregnant. If you drink alcohol: Limit how much you have to: 0-1 drink a day for women. 0-2 drinks a day for men. Know how much alcohol is in your drink. In the [place] one drink equals one 12 oz bottle of beer (355 mL), one 5 oz glass of wine (148 mL), or one 1 oz glass of hard liquor (44 mL). Lifestyle  Work with your health care provider to maintain a healthy body weight or to lose weight. Ask what an ideal weight is for you. Get at least 30 minutes of exercise that causes your heart to beat faster (aerobic exercise) most days of the week. Activities may include walking, swimming, or biking. Include exercise to strengthen your muscles (resistance exercise), such as Pilates or lifting weights, as part of your weekly exercise routine. Try to do these types of exercises for 30 minutes at least 3 days a week. Do not use any products that contain nicotine or tobacco. These products include cigarettes, chewing tobacco, and vaping devices, such as e-cigarettes. If you need help quitting, ask your health care provider. Monitor your blood pressure at home as told by your health care provider. Keep all follow-up visits. This is important. Medicines Take over-the-counter and prescription medicines only as  told by your health care provider. Follow directions carefully. Blood pressure medicines must be taken as prescribed. Do not skip doses of blood pressure medicine. Doing this puts you at risk for problems and can make the medicine less effective. Ask your health care provider about side effects or reactions to medicines that you should watch for. Contact a health care provider if you: Think you are having a reaction to a medicine you are taking. Have headaches that keep coming back (recurring). Feel dizzy. Have swelling in your ankles. Have trouble with your vision. Get help right away if you: Develop a severe headache or confusion. Have unusual weakness or numbness. Feel faint. Have severe pain in your chest or abdomen. Vomit repeatedly. Have trouble breathing. These symptoms may be an emergency. Get help right away. Call 911. Do not wait to see if the symptoms will go away. Do not drive yourself to the hospital. Summary Hypertension is when the force of blood pumping through your arteries is too strong. If this condition is not controlled, it may put you at risk for serious complications. Your personal target blood pressure may vary depending on your medical conditions, your age, and other factors. For most people, a normal blood pressure is less than 120/80. Hypertension is treated with lifestyle changes, medicines, or a combination of both. Lifestyle changes  include losing weight, eating a healthy, low-sodium diet, exercising more, and limiting alcohol. This information is not intended to replace advice given to you by your health care provider. Make sure you discuss any questions you have with your health care provider. Document Revised: 10/17/2021 Document Reviewed: 10/17/2021 Elsevier Patient Education  [place].
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
## 2022-11-06 NOTE — Patient Instructions (Signed)

## 2022-11-06 NOTE — Telephone Encounter (Signed)
..     Pre-operative Risk Assessment    Patient Name: [name]  DOB: 01/17/1946 MRN: [id number]      Request for Surgical Clearance    Procedure:  Dental Extraction - Amount of Teeth to be Pulled:  1  Date of Surgery:  Clearance TBD                                 Surgeon:  DR. Pollie Friar Surgeon's Group or Practice Name:  [facility] Phone number:  [phone] Fax number:  [phone]   Type of Clearance Requested:   - Medical  - Pharmacy:  Hold Aspirin and Clopidogrel (Plavix)     Type of Anesthesia:  Not Indicated   Additional requests/questions:    Gwenlyn Found   11/06/2022, 11:50 AM

## 2022-11-06 NOTE — Therapy (Signed)
OUTPATIENT PHYSICAL THERAPY CERVICAL EVALUATION   Patient Name: [name] MRN: [id number] DOB:09/19/1946, 76 y.o., female Today's Date: 11/06/2022   PT End of Session - 11/06/22 1103     Visit Number 1    Number of Visits 12    Date for PT Re-Evaluation 12/18/22    Authorization Type [employer] Medicare    Progress Note Due on Visit 10    PT Start Time 1103    PT Stop Time 1147    PT Time Calculation (min) 44 min    Activity Tolerance Patient tolerated treatment well    Behavior During Therapy [facility] for tasks assessed/performed             Past Medical History:  Diagnosis Date   Acute MI, subendocardial ([place])    Arthritis    Back pain 05/31/2013   Benign paroxysmal positional vertigo 08/05/2016   CAD (coronary artery disease)    a. NSTEMI 10/2011: [place] 11/19/11: pLAD 30%, oOM 60%, AVCFX 30%, CFX after OM2 30%, pRCA 60 and 70%, then 99%, AM 80-90% with TIMI 3 flow.  PCI: Promus DES x 2 to RCA; b. 06/2012 Cath: patent RCA stents w/ subtl occl of Acute Marginal (jailed)->Med rx; c. 05/2015 MV: EF 59%, mod mid infsept/inf/ap lat/ap infarct with peri-infarct isch-->Med Rx; d. 02/2016 Cath: LM nl, LAD 31m RI 50, RCA patent stents.   Colitis    Facial skin lesion 01/28/2017   GERD (gastroesophageal reflux disease)    occasional   Hyperlipidemia    Hypertension    Hypertensive heart disease    a. Echocardiogram 11/19/11: Difficult acoustic windows, EF 60-65%, normal LV wall thickness, grade 1 diastolic dysfunction   Hypoparathyroidism (HCC)    Low zinc level 11/26/2016   Multinodular thyroid    Goiter s/p thyroidectomy in 2007 with post-op  hypocalcemia and post-op hypothyroidism/hypoparathyroidism with hypocalcemia   Neck pain on right side 07/13/2013   Nocturia 05/31/2013   Osteoarthritis    Pain in right axilla 03/13/2016   Palpitations    a. 03/2012 - patient set up for event monitor but did not wear correctly -  she declined wearing a repeat monitor   Post-surgical hypothyroidism     Sun-damaged skin 12/05/2014   Tubular adenoma of colon 06/2011   Unspecified constipation 05/31/2013   Vertigo    Zinc deficiency 11/26/2015   Past Surgical History:  Procedure Laterality Date   CARDIAC CATHETERIZATION N/A 03/08/2016   Procedure: Left Heart Cath and Coronary Angiography;  Surgeon: JJettie Booze MD;  [facility];  Service: Cardiovascular;  Laterality: N/A;   COLONOSCOPY  Aenomatous polyps   07/05/2011   COLONOSCOPY N/A 09/28/2014   Procedure: COLONOSCOPY;  Surgeon: MLadene Artist MD;  Location: [facility];  Service: Endoscopy;  Laterality: N/A;   CORONARY ANGIOPLASTY WITH STENT PLACEMENT     LEFT HEART CATH AND CORONARY ANGIOGRAPHY N/A 12/09/2018   Procedure: LEFT HEART CATH AND CORONARY ANGIOGRAPHY;  Surgeon: VJettie Booze MD;  [facility];  Service: Cardiovascular;  Laterality: N/A;   LEFT HEART CATHETERIZATION WITH CORONARY ANGIOGRAM N/A 07/17/2012   Procedure: LEFT HEART CATHETERIZATION WITH CORONARY ANGIOGRAM;  Surgeon: THillary Bow MD;  [facility];  Service: Cardiovascular;  Laterality: N/A;   PERCUTANEOUS CORONARY STENT INTERVENTION (PCI-S) N/A 11/19/2011   Procedure: PERCUTANEOUS CORONARY STENT INTERVENTION (PCI-S);  Surgeon: THillary Bow MD;  [facility];  Service: Cardiovascular;  Laterality: N/A;   POLYPECTOMY  SHOULDER ARTHROSCOPY W/ ROTATOR CUFF REPAIR     right   TOTAL THYROIDECTOMY  2007   GOITER   Patient Active Problem List   Diagnosis Date Noted   Paresthesia 08/28/2022   Chronic neck pain 08/28/2022   Neuropathy 08/21/2022   Vitamin deficiency 10/23/2021   Hypoglycemia 10/07/2019   Heart disease 01/29/2019   Sensorineural hearing loss (SNHL) of both ears 01/27/2019   Primary osteoarthritis of both knees 12/19/2018   DDD (degenerative disc disease), lumbar 12/19/2018   ANA positive 10/27/2018   Anxiety 02/17/2018   Peripheral arterial disease ([place]) 02/20/2017    Skin lesion of face 01/28/2017   Benign paroxysmal positional vertigo 08/05/2016   Antiplatelet or antithrombotic long-term use 03/27/2016   Right lumbar radiculopathy 02/21/2016   Abnormality of gait 02/21/2016   Zinc deficiency 11/26/2015   Right hip pain 10/26/2015   Medicare annual wellness visit, subsequent 03/13/2015   Preventative health care 03/13/2015   Sun-damaged skin 12/05/2014   Angina of effort 12/02/2014   Hx of colonic polyps 09/28/2014   Benign neoplasm of descending colon 09/28/2014   Other fatigue 09/10/2014   Personal history of colonic polyps 07/16/2014   Bilateral thumb pain 03/20/2014   Postsurgical hypoparathyroidism ([place]) 08/03/2013   Neck pain on right side 07/13/2013   Constipation 05/31/2013   Back pain 05/31/2013   Nocturia 05/31/2013   GERD (gastroesophageal reflux disease) 11/02/2012   Tension headache 11/02/2012   Anemia 06/11/2012   Hypokalemia 06/11/2012   Depression 01/21/2012   Headache(784.0) 12/23/2011   Bradycardia 12/07/2011   Hypocalcemia 12/07/2011   Acute MI, subendocardial ([place]) 11/20/2011   Hypothyroidism 05/20/2009   Essential hypertension, benign 05/20/2009   Hyperlipidemia 03/08/2009   CAD, NATIVE VESSEL 03/08/2009    PCP: Mosie Lukes, MD  REFERRING PROVIDER: Glenford Peers, NP  REFERRING DIAG: [phone] (ICD-10-CM) - Other spondylosis with radiculopathy, cervical region  THERAPY DIAG:  Cervicalgia  Chronic right-sided low back pain with right-sided sciatica  Abnormal posture  Cramp and spasm  Rationale for Evaluation and Treatment: Rehabilitation  ONSET DATE: chronic for years  SUBJECTIVE:                                                                                                                                                                                                         SUBJECTIVE STATEMENT: Patient reports she has had neck and back pain for years.  Lately she is getting more pain into her  shoulders and both hands.  She was sent to neurosurgery, and they are planning on injections.  She had  last injections last June or July.  They do help for about 2 months, but she is worried about side effects for continuing.  She was diagnosed with neuropathy in hands and feet but denies foot pain.  She reports she has vertigo, especially rolling over in bed and moving head, but refuses to get it treated because sister has same condition and it didn't work, thinks its familial.     PERTINENT HISTORY:  Surgical hypoparathyrodism, angina (has nitro), Hypothyroidism, hyperlipidemia, HTN, neuropathy hands and feet, R RTC repair  PAIN:  Are you having pain? Yes: NPRS scale: 6/10 Pain location: both side of neck to shoulders Pain description: sometimes sharp shooting, sometimes arms feel weak, constant pain Aggravating factors: moving head overhead, reaching behind her back, turning neck (gets dizzy),  Relieving factors: holding arms close, avoiding movement.   Are you having pain? Yes: NPRS scale: 4/10 Pain location: low back, down R side to mid calf, sometimes numbness in feet with prolonged standing Pain description: pain and numbness Aggravating factors: bending over Relieving factors: laying down, icy/hot and patches, hot showers  PRECAUTIONS: None  WEIGHT BEARING RESTRICTIONS: No  FALLS:  Has patient fallen in last 6 months? No  LIVING ENVIRONMENT: Lives with: lives alone Lives in: House/apartment Stairs: No Has following equipment at home: None  OCCUPATION: retired  PLOF: Independent and Leisure: was going to [employer] but stopped due to pain  PATIENT GOALS: I want to be pain free without medications, know what machines are safe to use  NEXT MD VISIT: today with PCP  OBJECTIVE:   DIAGNOSTIC FINDINGS:  MRI done but not available to review  PATIENT SURVEYS:  NDI 23/50 = 46%  moderate disability   COGNITION: Overall cognitive status: Within functional limits for tasks  assessed  SENSATION: Light touch: WFL  POSTURE: rounded shoulders and forward head  PALPATION: Tenderness throughout bil upper traps, levator scapulae, and cervical paraspinals, wincing and jumping away from firm touch.    CERVICAL ROM:   Active ROM AROM (deg) eval  Flexion 15  Extension 20  Right lateral flexion 10  Left lateral flexion 15  Right rotation 40  Left rotation 40   (Blank rows = not tested)  UPPER EXTREMITY MMT:   MMT Right eval Left eval  Shoulder flexion 4+ 4+  Shoulder extension    Shoulder abduction 4+* 4  Shoulder adduction    Shoulder internal rotation 5 5  Shoulder external rotation 5 5  Elbow flexion 5 5  Elbow extension 5 5  Wrist flexion 5 5  Wrist extension 5 5   (Blank rows = not tested)  UPPER EXTREMITY ROM:  MMT Right eval Left eval  Shoulder flexion 115* 120  Shoulder extension 40 65  Shoulder abduction 100 135  Shoulder adduction    Shoulder internal rotation    Shoulder external rotation     (Blank rows = not tested)  LOWER EXTREMITY MMT:    MMT Right eval Left eval  Hip flexion 4 4  Hip extension    Hip abduction 5 5  Hip adduction 4+ 4+  Hip internal rotation    Hip external rotation    Knee flexion    Knee extension 5 5  Ankle dorsiflexion 5 5  Ankle plantarflexion     (Blank rows = not tested)   CERVICAL SPECIAL TESTS:  Spurling's test: Positive right  FUNCTIONAL TESTS:  5 times sit to stand: 35 seconds Increased sciatic pain with R leg extension in sitting.  TODAY'S TREATMENT:                                                                                                                              DATE:   11/06/2022 Self Care: see patient education     PATIENT EDUCATION:  Education details: education on findings, POC, initial HEP, modalities including dry needling.  Person educated: Patient Education method: Explanation, Demonstration, Verbal cues, and Handouts Education comprehension:  verbalized understanding and returned demonstration  HOME EXERCISE PROGRAM: Access Code: [email or web address] URL: [email or web address] Date: 11/06/2022 Prepared by: Glenetta Hew  Exercises - Seated Cervical Retraction  - 3 x daily - 7 x weekly - 1 sets - 10 reps - Seated Scapular Retraction  - 3 x daily - 7 x weekly - 1 sets - 10 reps - Seated Shoulder Rolls  - 3 x daily - 7 x weekly - 1 sets - 10 reps  ASSESSMENT:  CLINICAL IMPRESSION: Patient is a right hand dominant 76 y.o. female who was seen today for physical therapy evaluation and treatment for cervical radiculopathy.   She would also like her back pain pain addressed, but we have not yet received an order for her back pain.  She has a follow-up with her PCP today and will request order.  She reports both her neck pain and back pain are chronic in nature for many many years, she has received injections for her neck pain as recently as July of this year, but worries about side effects if continuing to receive injections.  MRI has been done, she has the results on the CD, and will try to bring the next session.  She reports more pain in right shoulder, and reported more pain with overhead movements on the right side, but also has history of right rotator cuff repair.  She has significant muscle spasm in her cervical paraspinals,  upper trap, and levator scapulae and forward head posture. [name] would benefit from skilled physical therapy to decrease neck pain, improve cervical range of motion and posture, and improve quality of life.  Today she was given initial HEP focusing on postural strengthening, instructed to perform within pain-free range of motion.   OBJECTIVE IMPAIRMENTS: decreased activity tolerance, decreased mobility, decreased ROM, decreased strength, dizziness, hypomobility, increased fascial restrictions, impaired perceived functional ability, increased muscle spasms, impaired flexibility, postural  dysfunction, and pain.   ACTIVITY LIMITATIONS: carrying, lifting, bending, standing, sleeping, stairs, transfers, dressing, reach over head, and hygiene/grooming  PARTICIPATION LIMITATIONS: meal prep, cleaning, laundry, driving, shopping, and community activity  PERSONAL FACTORS: Age, Time since onset of injury/illness/exacerbation, and 3+ comorbidities: angina, hypothyroidism, neck surgery, neuropathy, R RTC repair, osteoporosis, CAD, history MI, BPPV, chronic low back pain  are also affecting patient's functional outcome.   REHAB POTENTIAL: Good  CLINICAL DECISION MAKING: Evolving/moderate complexity  EVALUATION COMPLEXITY: Moderate   GOALS: Goals reviewed with patient? Yes  SHORT TERM GOALS: Target date: 11/20/2022   Patient will be independent  with initial HEP.  Baseline: given Goal status: INITIAL   LONG TERM GOALS: Target date: 01/01/2023   Patient will be independent with advanced/ongoing HEP to improve outcomes and carryover.  Baseline:  Goal status: INITIAL  2.  Patient will report 75% improvement in neck pain to improve QOL.  Baseline: severe Goal status: INITIAL  3.  Patient will demonstrate improved cervical ROM by 10 deg all directions for safety with driving.  Baseline: see objective Goal status: INITIAL  4.  Patient will report 15% improvement on NDI to demonstrate improved functional ability.  Baseline: 46% Goal status: INITIAL  5.  Patient will report 75% improvement in low back pain to improve quality of life. Baseline:   Goal status: INITIAL  6. Patient will report 12% improvement on modified Oswestry to demonstrate improved functional ability. Baseline: TBA Goal status: INITIAL   7. Patient will will be able to return to community-based exercise program to maintain progress. Baseline: stopped exercising due to pain Goal status: INITIAL    PLAN:  PT FREQUENCY: 1-2x/week  PT DURATION: 8 weeks  PLANNED INTERVENTIONS: Therapeutic exercises,  Therapeutic activity, Neuromuscular re-education, Balance training, Gait training, Patient/Family education, Self Care, Joint mobilization, Stair training, Dry Needling, Electrical stimulation, Spinal mobilization, Cryotherapy, Moist heat, Taping, Traction, Ultrasound, Manual therapy, and Re-evaluation  PLAN FOR NEXT SESSION: evaluate back; review and progress HEP including sciatic nerve stretches, manual therapy and modalities PRN.    Rennie Natter, PT, DPT  11/06/2022, 4:21 PM

## 2022-11-07 LAB — CBC
HCT: 41.2 % (ref 36.0–46.0)
Hemoglobin: 13.8 g/dL (ref 12.0–15.0)
MCHC: 33.6 g/dL (ref 30.0–36.0)
MCV: 87.3 fl (ref 78.0–100.0)
Platelets: 298 10*3/uL (ref 150.0–400.0)
RBC: 4.72 Mil/uL (ref 3.87–5.11)
RDW: 14.2 % (ref 11.5–15.5)
WBC: 9.4 10*3/uL (ref 4.0–10.5)

## 2022-11-07 LAB — LIPID PANEL
Cholesterol: 168 mg/dL (ref 0–200)
HDL: 47.9 mg/dL (ref >39.00)
NonHDL: 119.89
Total CHOL/HDL Ratio: 4
Triglycerides: 283 mg/dL — ABNORMAL HIGH (ref 0.0–149.0)
VLDL: 56.6 mg/dL — ABNORMAL HIGH (ref 0.0–40.0)

## 2022-11-07 LAB — COMPREHENSIVE METABOLIC PANEL
ALT: 11 U/L (ref 0–35)
AST: 16 U/L (ref 0–37)
Albumin: 4 g/dL (ref 3.5–5.2)
Alkaline Phosphatase: 90 U/L (ref 39–117)
BUN: 24 mg/dL — ABNORMAL HIGH (ref 6–23)
CO2: 32 mEq/L (ref 19–32)
Calcium: 9 mg/dL (ref 8.4–10.5)
Chloride: 102 mEq/L (ref 96–112)
Creatinine, Ser: 1.07 mg/dL (ref 0.40–1.20)
GFR: 50.74 mL/min — ABNORMAL LOW (ref >60.00)
Glucose, Bld: 97 mg/dL (ref 70–99)
Potassium: 3.8 mEq/L (ref 3.5–5.1)
Sodium: 142 mEq/L (ref 135–145)
Total Bilirubin: 0.3 mg/dL (ref 0.2–1.2)
Total Protein: 6.9 g/dL (ref 6.0–8.3)

## 2022-11-07 LAB — TSH: TSH: 0.79 u[IU]/mL (ref 0.35–5.50)

## 2022-11-07 LAB — LDL CHOLESTEROL, DIRECT: Direct LDL: 96 mg/dL

## 2022-11-07 LAB — HIGH SENSITIVITY CRP: CRP, High Sensitivity: 9.12 mg/L — ABNORMAL HIGH (ref 0.000–5.000)

## 2022-11-07 NOTE — Assessment & Plan Note (Signed)
Supplement and monitor 

## 2022-11-07 NOTE — Assessment & Plan Note (Signed)
She has been having some episodes of chest discomfort but they pass spontaneously and are not escalating, no associated symptoms. She was just evaluated by cardiology. She will call cards if symptoms escalate change and she will seek care if pain occurs and does not respond to NTG

## 2022-11-07 NOTE — Telephone Encounter (Signed)
   Patient Name: [name]  DOB: [birth year] MRN: [id number]  Primary Cardiologist: Dorris Carnes, MD  Chart reviewed as part of pre-operative protocol coverage.   IF SIMPLE EXTRACTION/CLEANINGS: Simple dental extractions (i.e. 1-2 teeth) are considered low risk procedures per guidelines and generally do not require any specific cardiac clearance. It is also generally accepted that for simple extractions and dental cleanings, there is no need to interrupt blood thinner therapy.  SBE prophylaxis is not required for the patient from a cardiac standpoint.  I will route this recommendation to the requesting party via Epic fax function and remove from pre-op pool.  Please call with questions.  Elgie Collard, PA-C 11/07/2022, 8:15 AM

## 2022-11-07 NOTE — Assessment & Plan Note (Signed)
She has been very stressed due to the state of the world and her family. She can use Diazepam sparingly

## 2022-11-09 ENCOUNTER — Ambulatory Visit: Payer: Medicare Other | Admitting: Physical Therapy

## 2022-11-09 ENCOUNTER — Encounter: Payer: Self-pay | Admitting: Physical Therapy

## 2022-11-09 DIAGNOSIS — M542 Cervicalgia: Secondary | ICD-10-CM | POA: Diagnosis not present

## 2022-11-09 DIAGNOSIS — R293 Abnormal posture: Secondary | ICD-10-CM

## 2022-11-09 DIAGNOSIS — G8929 Other chronic pain: Secondary | ICD-10-CM | POA: Diagnosis not present

## 2022-11-09 DIAGNOSIS — M5441 Lumbago with sciatica, right side: Secondary | ICD-10-CM | POA: Diagnosis not present

## 2022-11-09 DIAGNOSIS — R252 Cramp and spasm: Secondary | ICD-10-CM | POA: Diagnosis not present

## 2022-11-09 NOTE — Therapy (Signed)
OUTPATIENT PHYSICAL THERAPY TREATMENT   Patient Name: [name] MRN: [id number] DOB:10/15/1946, 76 y.o., female Today's Date: 11/09/2022   PT End of Session - 11/09/22 1110     Visit Number 2    Number of Visits 12    Date for PT Re-Evaluation 12/18/22    Authorization Type [employer] Medicare    Progress Note Due on Visit 10    PT Start Time 1105    Activity Tolerance Patient tolerated treatment well    Behavior During Therapy [facility] for tasks assessed/performed             Past Medical History:  Diagnosis Date   Acute MI, subendocardial ([place])    Arthritis    Back pain 05/31/2013   Benign paroxysmal positional vertigo 08/05/2016   CAD (coronary artery disease)    a. NSTEMI 10/2011: [place] 11/19/11: pLAD 30%, oOM 60%, AVCFX 30%, CFX after OM2 30%, pRCA 60 and 70%, then 99%, AM 80-90% with TIMI 3 flow.  PCI: Promus DES x 2 to RCA; b. 06/2012 Cath: patent RCA stents w/ subtl occl of Acute Marginal (jailed)->Med rx; c. 05/2015 MV: EF 59%, mod mid infsept/inf/ap lat/ap infarct with peri-infarct isch-->Med Rx; d. 02/2016 Cath: LM nl, LAD 61m RI 50, RCA patent stents.   Colitis    Facial skin lesion 01/28/2017   GERD (gastroesophageal reflux disease)    occasional   Hyperlipidemia    Hypertension    Hypertensive heart disease    a. Echocardiogram 11/19/11: Difficult acoustic windows, EF 60-65%, normal LV wall thickness, grade 1 diastolic dysfunction   Hypoparathyroidism (HCC)    Low zinc level 11/26/2016   Multinodular thyroid    Goiter s/p thyroidectomy in 2007 with post-op  hypocalcemia and post-op hypothyroidism/hypoparathyroidism with hypocalcemia   Neck pain on right side 07/13/2013   Nocturia 05/31/2013   Osteoarthritis    Pain in right axilla 03/13/2016   Palpitations    a. 03/2012 - patient set up for event monitor but did not wear correctly -  she declined wearing a repeat monitor   Post-surgical hypothyroidism    Sun-damaged skin 12/05/2014   Tubular adenoma of colon 06/2011    Unspecified constipation 05/31/2013   Vertigo    Zinc deficiency 11/26/2015   Past Surgical History:  Procedure Laterality Date   CARDIAC CATHETERIZATION N/A 03/08/2016   Procedure: Left Heart Cath and Coronary Angiography;  Surgeon: JJettie Booze MD;  [facility];  Service: Cardiovascular;  Laterality: N/A;   COLONOSCOPY  Aenomatous polyps   07/05/2011   COLONOSCOPY N/A 09/28/2014   Procedure: COLONOSCOPY;  Surgeon: MLadene Artist MD;  Location: [facility];  Service: Endoscopy;  Laterality: N/A;   CORONARY ANGIOPLASTY WITH STENT PLACEMENT     LEFT HEART CATH AND CORONARY ANGIOGRAPHY N/A 12/09/2018   Procedure: LEFT HEART CATH AND CORONARY ANGIOGRAPHY;  Surgeon: VJettie Booze MD;  [facility];  Service: Cardiovascular;  Laterality: N/A;   LEFT HEART CATHETERIZATION WITH CORONARY ANGIOGRAM N/A 07/17/2012   Procedure: LEFT HEART CATHETERIZATION WITH CORONARY ANGIOGRAM;  Surgeon: THillary Bow MD;  [facility];  Service: Cardiovascular;  Laterality: N/A;   PERCUTANEOUS CORONARY STENT INTERVENTION (PCI-S) N/A 11/19/2011   Procedure: PERCUTANEOUS CORONARY STENT INTERVENTION (PCI-S);  Surgeon: THillary Bow MD;  [facility];  Service: Cardiovascular;  Laterality: N/A;   POLYPECTOMY     SHOULDER ARTHROSCOPY W/ ROTATOR CUFF REPAIR     right   TOTAL THYROIDECTOMY  2007   GOITER   Patient Active Problem List   Diagnosis Date Noted   Paresthesia 08/28/2022   Chronic neck pain 08/28/2022   Neuropathy 08/21/2022   Vitamin deficiency 10/23/2021   Hypoglycemia 10/07/2019   Heart disease 01/29/2019   Sensorineural hearing loss (SNHL) of both ears 01/27/2019   Primary osteoarthritis of both knees 12/19/2018   DDD (degenerative disc disease), lumbar 12/19/2018   ANA positive 10/27/2018   Anxiety 02/17/2018   Peripheral arterial disease ([place]) 02/20/2017   Skin lesion of face 01/28/2017   Benign paroxysmal positional  vertigo 08/05/2016   Antiplatelet or antithrombotic long-term use 03/27/2016   Right lumbar radiculopathy 02/21/2016   Abnormality of gait 02/21/2016   Zinc deficiency 11/26/2015   Right hip pain 10/26/2015   Medicare annual wellness visit, subsequent 03/13/2015   Preventative health care 03/13/2015   Sun-damaged skin 12/05/2014   Angina of effort 12/02/2014   Hx of colonic polyps 09/28/2014   Benign neoplasm of descending colon 09/28/2014   Other fatigue 09/10/2014   Personal history of colonic polyps 07/16/2014   Bilateral thumb pain 03/20/2014   Postsurgical hypoparathyroidism ([place]) 08/03/2013   Neck pain on right side 07/13/2013   Constipation 05/31/2013   Back pain 05/31/2013   Nocturia 05/31/2013   GERD (gastroesophageal reflux disease) 11/02/2012   Tension headache 11/02/2012   Anemia 06/11/2012   Hypokalemia 06/11/2012   Depression 01/21/2012   Headache(784.0) 12/23/2011   Bradycardia 12/07/2011   Hypocalcemia 12/07/2011   Acute MI, subendocardial ([place]) 11/20/2011   Hypothyroidism 05/20/2009   Essential hypertension, benign 05/20/2009   Hyperlipidemia 03/08/2009   CAD, NATIVE VESSEL 03/08/2009    PCP: Mosie Lukes, MD  REFERRING PROVIDER: Glenford Peers, NP  REFERRING DIAG: [phone] (ICD-10-CM) - Other spondylosis with radiculopathy, cervical region  THERAPY DIAG:  Cervicalgia  Chronic right-sided low back pain with right-sided sciatica  Abnormal posture  Cramp and spasm  Rationale for Evaluation and Treatment: Rehabilitation  ONSET DATE: chronic for years  SUBJECTIVE:                                                                                                                                                                                                         SUBJECTIVE STATEMENT: [name] reports that she saw her PCP and asked for referral, but there is no record of it.  She reports pain is less today in her neck, she did the exercises this  morning and put on pain patches.       PERTINENT HISTORY:  Surgical hypoparathyrodism, angina (has nitro), Hypothyroidism, hyperlipidemia,  HTN, neuropathy hands and feet, R RTC repair  PAIN:  Are you having pain? Yes: NPRS scale: 3/10 Pain location: both side of neck to shoulders Pain description: sometimes sharp shooting, sometimes arms feel weak, constant pain Aggravating factors: moving head overhead, reaching behind her back, turning neck (gets dizzy),  Relieving factors: holding arms close, avoiding movement.   Are you having pain? Yes: NPRS scale: 6/10 Pain location: low back, down R side to mid calf, sometimes numbness in feet with prolonged standing Pain description: pain and numbness Aggravating factors: bending over Relieving factors: laying down, icy/hot and patches, hot showers  PRECAUTIONS: None  WEIGHT BEARING RESTRICTIONS: No  FALLS:  Has patient fallen in last 6 months? No  LIVING ENVIRONMENT: Lives with: lives alone Lives in: House/apartment Stairs: No Has following equipment at home: None  OCCUPATION: retired  PLOF: Independent and Leisure: was going to [employer] but stopped due to pain  PATIENT GOALS: I want to be pain free without medications, know what machines are safe to use  NEXT MD VISIT: today with PCP  OBJECTIVE:   DIAGNOSTIC FINDINGS:  MRI done but not available to review  PATIENT SURVEYS:  NDI 23/50 = 46%  moderate disability   COGNITION: Overall cognitive status: Within functional limits for tasks assessed  SENSATION: Light touch: WFL  POSTURE: rounded shoulders and forward head  PALPATION: Tenderness throughout bil upper traps, levator scapulae, and cervical paraspinals, wincing and jumping away from firm touch.    CERVICAL ROM:   Active ROM AROM (deg) eval  Flexion 15  Extension 20  Right lateral flexion 10  Left lateral flexion 15  Right rotation 40  Left rotation 40   (Blank rows = not tested)  UPPER EXTREMITY MMT:    MMT Right eval Left eval  Shoulder flexion 4+ 4+  Shoulder extension    Shoulder abduction 4+* 4  Shoulder adduction    Shoulder internal rotation 5 5  Shoulder external rotation 5 5  Elbow flexion 5 5  Elbow extension 5 5  Wrist flexion 5 5  Wrist extension 5 5   (Blank rows = not tested)  UPPER EXTREMITY ROM:  MMT Right eval Left eval  Shoulder flexion 115* 120  Shoulder extension 40 65  Shoulder abduction 100 135  Shoulder adduction    Shoulder internal rotation    Shoulder external rotation     (Blank rows = not tested)  LOWER EXTREMITY MMT:    MMT Right eval Left eval  Hip flexion 4 4  Hip extension    Hip abduction 5 5  Hip adduction 4+ 4+  Hip internal rotation    Hip external rotation    Knee flexion    Knee extension 5 5  Ankle dorsiflexion 5 5  Ankle plantarflexion     (Blank rows = not tested)   LUMBAR ROM:   Active  AROM  11/09/2022  Flexion To mid-thigh  Extension WNL, no pain, but increased pain after repeated 5x  Right lateral flexion Inc pain R side back, to knee  Left lateral flexion To mid thigh, increased pain R side back  Right rotation WNL  Left rotation WNL   (Blank rows = not tested)   CERVICAL SPECIAL TESTS:  Spurling's test: Positive right  FUNCTIONAL TESTS:  5 times sit to stand: 35 seconds Increased sciatic pain with R leg extension in sitting.   TODAY'S TREATMENT:  DATE:  11/09/2022 Therapeutic Exercise: to improve strength and mobility.  Demo, verbal and tactile cues throughout for technique. Nustep L4 x 7 min LTR x 10 PPT 10 x 3 sec hold - cues for breathing  Bridges with TrA 2 x 10  Piriformis stretch bil Supine clams with TrA x 10, added RTB x 10  Sidelying clams x 10  R side only  Supine open book leading with R elbow x 10 Seated cervical snags with pillow case into extension and  rotation.  Manual Therapy: to decrease muscle spasm and pain and improve mobility STM/TPR to bil cervical paraspinals,UT, levator.  Very tender on R UT, responded better to MFR to release.   11/06/2022 Self Care: see patient education     PATIENT EDUCATION:  Education details: HEP update 11/09/22.  Person educated: Patient Education method: Explanation, Demonstration, Verbal cues, and Handouts Education comprehension: verbalized understanding and returned demonstration  HOME EXERCISE PROGRAM: Access Code: [email or web address] URL: [email or web address] Date: 11/09/2022 Prepared by: Glenetta Hew  Exercises - Seated Cervical Retraction  - 3 x daily - 7 x weekly - 1 sets - 10 reps - Seated Scapular Retraction  - 3 x daily - 7 x weekly - 1 sets - 10 reps - Seated Shoulder Rolls  - 3 x daily - 7 x weekly - 1 sets - 10 reps - Cervical Extension AROM with Strap  - 1 x daily - 7 x weekly - 1 sets - 10 reps - Seated Assisted Cervical Rotation with Towel  - 1 x daily - 7 x weekly - 1 sets - 10 reps - Standing Thoracic Open Book at [facility]  - 1 x daily - 7 x weekly - 1 sets - 10 reps - Supine Posterior Pelvic Tilt  - 1 x daily - 7 x weekly - 2 sets - 10 reps - 3 sec  hold - Supine Transversus Abdominis Bracing with Double Leg Fallout  - 1 x daily - 7 x weekly - 2 sets - 10 reps - Clamshell  - 1 x daily - 7 x weekly - 1 sets - 10 reps  ASSESSMENT:  CLINICAL IMPRESSION: [name] demonstrates R sided low back pain and piriformis pain, started on gentle neutral spine exercises, educated to push hip away on R to decrease pressure on sciatic with stretching, since pulling towards was very painful.  She reported she had cupping on R hip yesterday and it is very sore.  Reviewed and progressed cervical stretches, followed by manual therapy to neck/upper shoulders in seated position due to vertigo.  R side still very tender, wincing away from touch to R levator, tolerated MFR better.  [name] continues to demonstrate potential for improvement and would benefit from continued skilled therapy to address impairments.     OBJECTIVE IMPAIRMENTS: decreased activity tolerance, decreased mobility, decreased ROM, decreased strength, dizziness, hypomobility, increased fascial restrictions, impaired perceived functional ability, increased muscle spasms, impaired flexibility, postural dysfunction, and pain.   ACTIVITY LIMITATIONS: carrying, lifting, bending, standing, sleeping, stairs, transfers, dressing, reach over head, and hygiene/grooming  PARTICIPATION LIMITATIONS: meal prep, cleaning, laundry, driving, shopping, and community activity  PERSONAL FACTORS: Age, Time since onset of injury/illness/exacerbation, and 3+ comorbidities: angina, hypothyroidism, neck surgery, neuropathy, R RTC repair, osteoporosis, CAD, history MI, BPPV, chronic low back pain  are also affecting patient's functional outcome.   REHAB POTENTIAL: Good  CLINICAL DECISION MAKING: Evolving/moderate complexity  EVALUATION COMPLEXITY: Moderate   GOALS: Goals reviewed with patient? Yes  SHORT TERM  GOALS: Target date: 11/20/2022   Patient will be independent with initial HEP.  Baseline: given Goal status: IN PROGRESS   LONG TERM GOALS: Target date: 01/01/2023   Patient will be independent with advanced/ongoing HEP to improve outcomes and carryover.  Baseline:  Goal status: INITIAL  2.  Patient will report 75% improvement in neck pain to improve QOL.  Baseline: severe Goal status: IN PROGRESS  3.  Patient will demonstrate improved cervical ROM by 10 deg all directions for safety with driving.  Baseline: see objective Goal status: IN PROGRESS  4.  Patient will report 15% improvement on NDI to demonstrate improved functional ability.  Baseline: 46% Goal status: IN PROGRESS  5.  Patient will report 75% improvement in low back pain to improve quality of life. Baseline:   Goal status: IN  PROGRESS  6. Patient will report 12% improvement on modified Oswestry to demonstrate improved functional ability. Baseline: TBA Goal status: IN PROGRESS   7. Patient will will be able to return to community-based exercise program to maintain progress. Baseline: stopped exercising due to pain Goal status: IN PROGRESS    PLAN:  PT FREQUENCY: 1-2x/week  PT DURATION: 8 weeks  PLANNED INTERVENTIONS: Therapeutic exercises, Therapeutic activity, Neuromuscular re-education, Balance training, Gait training, Patient/Family education, Self Care, Joint mobilization, Stair training, Dry Needling, Electrical stimulation, Spinal mobilization, Cryotherapy, Moist heat, Taping, Traction, Ultrasound, Manual therapy, and Re-evaluation  PLAN FOR NEXT SESSION: evaluate back; review and progress HEP including sciatic nerve stretches, manual therapy and modalities PRN.    Rennie Natter, PT, DPT  11/09/2022, 11:10 AM

## 2022-11-11 ENCOUNTER — Other Ambulatory Visit: Payer: Self-pay | Admitting: Family Medicine

## 2022-11-11 DIAGNOSIS — M545 Low back pain, unspecified: Secondary | ICD-10-CM

## 2022-11-12 ENCOUNTER — Encounter: Payer: Self-pay | Admitting: Family Medicine

## 2022-11-13 ENCOUNTER — Ambulatory Visit: Payer: Medicare Other

## 2022-11-13 DIAGNOSIS — G8929 Other chronic pain: Secondary | ICD-10-CM

## 2022-11-13 DIAGNOSIS — R252 Cramp and spasm: Secondary | ICD-10-CM | POA: Diagnosis not present

## 2022-11-13 DIAGNOSIS — M5441 Lumbago with sciatica, right side: Secondary | ICD-10-CM | POA: Diagnosis not present

## 2022-11-13 DIAGNOSIS — M542 Cervicalgia: Secondary | ICD-10-CM | POA: Diagnosis not present

## 2022-11-13 DIAGNOSIS — R293 Abnormal posture: Secondary | ICD-10-CM | POA: Diagnosis not present

## 2022-11-13 NOTE — Therapy (Signed)
OUTPATIENT PHYSICAL THERAPY TREATMENT   Patient Name: [name] MRN: [id number] DOB:July 18, 1946, 76 y.o., female Today's Date: 11/13/2022   PT End of Session - 11/13/22 1015     Visit Number 3    Number of Visits 12    Date for PT Re-Evaluation 12/18/22    Authorization Type [employer] Medicare    Progress Note Due on Visit 10    PT Start Time 0934    PT Stop Time 1013    PT Time Calculation (min) 39 min    Activity Tolerance Patient tolerated treatment well    Behavior During Therapy [facility] for tasks assessed/performed              Past Medical History:  Diagnosis Date   Acute MI, subendocardial ([place])    Arthritis    Back pain 05/31/2013   Benign paroxysmal positional vertigo 08/05/2016   CAD (coronary artery disease)    a. NSTEMI 10/2011: [place] 11/19/11: pLAD 30%, oOM 60%, AVCFX 30%, CFX after OM2 30%, pRCA 60 and 70%, then 99%, AM 80-90% with TIMI 3 flow.  PCI: Promus DES x 2 to RCA; b. 06/2012 Cath: patent RCA stents w/ subtl occl of Acute Marginal (jailed)->Med rx; c. 05/2015 MV: EF 59%, mod mid infsept/inf/ap lat/ap infarct with peri-infarct isch-->Med Rx; d. 02/2016 Cath: LM nl, LAD 11m RI 50, RCA patent stents.   Colitis    Facial skin lesion 01/28/2017   GERD (gastroesophageal reflux disease)    occasional   Hyperlipidemia    Hypertension    Hypertensive heart disease    a. Echocardiogram 11/19/11: Difficult acoustic windows, EF 60-65%, normal LV wall thickness, grade 1 diastolic dysfunction   Hypoparathyroidism (HCC)    Low zinc level 11/26/2016   Multinodular thyroid    Goiter s/p thyroidectomy in 2007 with post-op  hypocalcemia and post-op hypothyroidism/hypoparathyroidism with hypocalcemia   Neck pain on right side 07/13/2013   Nocturia 05/31/2013   Osteoarthritis    Pain in right axilla 03/13/2016   Palpitations    a. 03/2012 - patient set up for event monitor but did not wear correctly -  she declined wearing a repeat monitor   Post-surgical hypothyroidism     Sun-damaged skin 12/05/2014   Tubular adenoma of colon 06/2011   Unspecified constipation 05/31/2013   Vertigo    Zinc deficiency 11/26/2015   Past Surgical History:  Procedure Laterality Date   CARDIAC CATHETERIZATION N/A 03/08/2016   Procedure: Left Heart Cath and Coronary Angiography;  Surgeon: JJettie Booze MD;  [facility];  Service: Cardiovascular;  Laterality: N/A;   COLONOSCOPY  Aenomatous polyps   07/05/2011   COLONOSCOPY N/A 09/28/2014   Procedure: COLONOSCOPY;  Surgeon: MLadene Artist MD;  Location: [facility] ENDOSCOPY;  Service: Endoscopy;  Laterality: N/A;   CORONARY ANGIOPLASTY WITH STENT PLACEMENT     LEFT HEART CATH AND CORONARY ANGIOGRAPHY N/A 12/09/2018   Procedure: LEFT HEART CATH AND CORONARY ANGIOGRAPHY;  Surgeon: VJettie Booze MD;  [facility];  Service: Cardiovascular;  Laterality: N/A;   LEFT HEART CATHETERIZATION WITH CORONARY ANGIOGRAM N/A 07/17/2012   Procedure: LEFT HEART CATHETERIZATION WITH CORONARY ANGIOGRAM;  Surgeon: THillary Bow MD;  [facility];  Service: Cardiovascular;  Laterality: N/A;   PERCUTANEOUS CORONARY STENT INTERVENTION (PCI-S) N/A 11/19/2011   Procedure: PERCUTANEOUS CORONARY STENT INTERVENTION (PCI-S);  Surgeon: THillary Bow MD;  [facility];  Service: Cardiovascular;  Laterality: N/A;   POLYPECTOMY  SHOULDER ARTHROSCOPY W/ ROTATOR CUFF REPAIR     right   TOTAL THYROIDECTOMY  2007   GOITER   Patient Active Problem List   Diagnosis Date Noted   Paresthesia 08/28/2022   Chronic neck pain 08/28/2022   Neuropathy 08/21/2022   Vitamin deficiency 10/23/2021   Hypoglycemia 10/07/2019   Heart disease 01/29/2019   Sensorineural hearing loss (SNHL) of both ears 01/27/2019   Primary osteoarthritis of both knees 12/19/2018   DDD (degenerative disc disease), lumbar 12/19/2018   ANA positive 10/27/2018   Anxiety 02/17/2018   Peripheral arterial disease ([place]) 02/20/2017    Skin lesion of face 01/28/2017   Benign paroxysmal positional vertigo 08/05/2016   Antiplatelet or antithrombotic long-term use 03/27/2016   Right lumbar radiculopathy 02/21/2016   Abnormality of gait 02/21/2016   Zinc deficiency 11/26/2015   Right hip pain 10/26/2015   Medicare annual wellness visit, subsequent 03/13/2015   Preventative health care 03/13/2015   Sun-damaged skin 12/05/2014   Angina of effort 12/02/2014   Hx of colonic polyps 09/28/2014   Benign neoplasm of descending colon 09/28/2014   Other fatigue 09/10/2014   Personal history of colonic polyps 07/16/2014   Bilateral thumb pain 03/20/2014   Postsurgical hypoparathyroidism ([place]) 08/03/2013   Neck pain on right side 07/13/2013   Constipation 05/31/2013   Back pain 05/31/2013   Nocturia 05/31/2013   GERD (gastroesophageal reflux disease) 11/02/2012   Tension headache 11/02/2012   Anemia 06/11/2012   Hypokalemia 06/11/2012   Depression 01/21/2012   Headache(784.0) 12/23/2011   Bradycardia 12/07/2011   Hypocalcemia 12/07/2011   Acute MI, subendocardial ([place]) 11/20/2011   Hypothyroidism 05/20/2009   Essential hypertension, benign 05/20/2009   Hyperlipidemia 03/08/2009   CAD, NATIVE VESSEL 03/08/2009    PCP: Mosie Lukes, MD  REFERRING PROVIDER: Glenford Peers, NP  REFERRING DIAG: [phone] (ICD-10-CM) - Other spondylosis with radiculopathy, cervical region  THERAPY DIAG:  Cervicalgia  Chronic right-sided low back pain with right-sided sciatica  Abnormal posture  Cramp and spasm  Rationale for Evaluation and Treatment: Rehabilitation  ONSET DATE: chronic for years  SUBJECTIVE:                                                                                                                                                                                                         SUBJECTIVE STATEMENT: No LBP today, yesterday her R hip was hurting.  PERTINENT HISTORY:  Surgical hypoparathyrodism,  angina (has nitro), Hypothyroidism, hyperlipidemia, HTN, neuropathy hands and feet, R RTC repair  PAIN:  Are you having pain? Yes: NPRS scale: 0/10  Pain location: both side of neck to shoulders Pain description: sometimes sharp shooting, sometimes arms feel weak, constant pain Aggravating factors: moving head overhead, reaching behind her back, turning neck (gets dizzy),  Relieving factors: holding arms close, avoiding movement.   Are you having pain? Yes: NPRS scale: 6/10 Pain location: low back, down R side to mid calf, sometimes numbness in feet with prolonged standing Pain description: pain and numbness Aggravating factors: bending over Relieving factors: laying down, icy/hot and patches, hot showers  PRECAUTIONS: None  WEIGHT BEARING RESTRICTIONS: No  FALLS:  Has patient fallen in last 6 months? No  LIVING ENVIRONMENT: Lives with: lives alone Lives in: House/apartment Stairs: No Has following equipment at home: None  OCCUPATION: retired  PLOF: Independent and Leisure: was going to [employer] but stopped due to pain  PATIENT GOALS: I want to be pain free without medications, know what machines are safe to use  NEXT MD VISIT: today with PCP  OBJECTIVE:   DIAGNOSTIC FINDINGS:  MRI done but not available to review  PATIENT SURVEYS:  NDI 23/50 = 46%  moderate disability   COGNITION: Overall cognitive status: Within functional limits for tasks assessed  SENSATION: Light touch: WFL  POSTURE: rounded shoulders and forward head  PALPATION: Tenderness throughout bil upper traps, levator scapulae, and cervical paraspinals, wincing and jumping away from firm touch.    CERVICAL ROM:   Active ROM AROM (deg) eval  Flexion 15  Extension 20  Right lateral flexion 10  Left lateral flexion 15  Right rotation 40  Left rotation 40   (Blank rows = not tested)  UPPER EXTREMITY MMT:   MMT Right eval Left eval  Shoulder flexion 4+ 4+  Shoulder extension    Shoulder  abduction 4+* 4  Shoulder adduction    Shoulder internal rotation 5 5  Shoulder external rotation 5 5  Elbow flexion 5 5  Elbow extension 5 5  Wrist flexion 5 5  Wrist extension 5 5   (Blank rows = not tested)  UPPER EXTREMITY ROM:  MMT Right eval Left eval  Shoulder flexion 115* 120  Shoulder extension 40 65  Shoulder abduction 100 135  Shoulder adduction    Shoulder internal rotation    Shoulder external rotation     (Blank rows = not tested)  LOWER EXTREMITY MMT:    MMT Right eval Left eval  Hip flexion 4 4  Hip extension    Hip abduction 5 5  Hip adduction 4+ 4+  Hip internal rotation    Hip external rotation    Knee flexion    Knee extension 5 5  Ankle dorsiflexion 5 5  Ankle plantarflexion     (Blank rows = not tested)   LUMBAR ROM:   Active  AROM  11/09/2022  Flexion To mid-thigh  Extension WNL, no pain, but increased pain after repeated 5x  Right lateral flexion Inc pain R side back, to knee  Left lateral flexion To mid thigh, increased pain R side back  Right rotation WNL  Left rotation WNL   (Blank rows = not tested)   CERVICAL SPECIAL TESTS:  Spurling's test: Positive right  FUNCTIONAL TESTS:  5 times sit to stand: 35 seconds Increased sciatic pain with R leg extension in sitting.   TODAY'S TREATMENT:  DATE:  11/09/2022 Therapeutic Exercise: to improve strength and mobility.  Demo, verbal and tactile cues throughout for technique. Nustep L4 x 7 min LTR x 10 bil PPT 10x 3 sec hold Bridges with TrA brace 2x10 Piriformis stretch 2x30 sec Supine clams RTB 2x10 with TrA brace Supine march RTB x 10 bil SLR x 10 Bil Seated cervical ext with pillow case x 10  Seated cervical AA rotation with pillow case x 10 bil  11/09/2022 Therapeutic Exercise: to improve strength and mobility.  Demo, verbal and tactile cues throughout  for technique. Nustep L4 x 7 min LTR x 10 PPT 10 x 3 sec hold - cues for breathing  Bridges with TrA 2 x 10  Piriformis stretch bil Supine clams with TrA x 10, added RTB x 10  Sidelying clams x 10  R side only  Supine open book leading with R elbow x 10 Seated cervical snags with pillow case into extension and rotation.  Manual Therapy: to decrease muscle spasm and pain and improve mobility STM/TPR to bil cervical paraspinals,UT, levator.  Very tender on R UT, responded better to MFR to release.   11/06/2022 Self Care: see patient education     PATIENT EDUCATION:  Education details: HEP update 11/09/22.  Person educated: Patient Education method: Explanation, Demonstration, Verbal cues, and Handouts Education comprehension: verbalized understanding and returned demonstration  HOME EXERCISE PROGRAM: Access Code: [email or web address] URL: [email or web address] Date: 11/09/2022 Prepared by: Glenetta Hew  Exercises - Seated Cervical Retraction  - 3 x daily - 7 x weekly - 1 sets - 10 reps - Seated Scapular Retraction  - 3 x daily - 7 x weekly - 1 sets - 10 reps - Seated Shoulder Rolls  - 3 x daily - 7 x weekly - 1 sets - 10 reps - Cervical Extension AROM with Strap  - 1 x daily - 7 x weekly - 1 sets - 10 reps - Seated Assisted Cervical Rotation with Towel  - 1 x daily - 7 x weekly - 1 sets - 10 reps - Standing Thoracic Open Book at [place]  - 1 x daily - 7 x weekly - 1 sets - 10 reps - Supine Posterior Pelvic Tilt  - 1 x daily - 7 x weekly - 2 sets - 10 reps - 3 sec  hold - Supine Transversus Abdominis Bracing with Double Leg Fallout  - 1 x daily - 7 x weekly - 2 sets - 10 reps - Clamshell  - 1 x daily - 7 x weekly - 1 sets - 10 reps  ASSESSMENT:  CLINICAL IMPRESSION: Pt showed a good tolerance for the progression of exercises. Was able to complete all interventions with instructions provided throughout session. Had no reports of pain today so we continued with progressing  exercises. By the end of session she reported that she was having some fatigue in muscles but that it was a good workout.   OBJECTIVE IMPAIRMENTS: decreased activity tolerance, decreased mobility, decreased ROM, decreased strength, dizziness, hypomobility, increased fascial restrictions, impaired perceived functional ability, increased muscle spasms, impaired flexibility, postural dysfunction, and pain.   ACTIVITY LIMITATIONS: carrying, lifting, bending, standing, sleeping, stairs, transfers, dressing, reach over head, and hygiene/grooming  PARTICIPATION LIMITATIONS: meal prep, cleaning, laundry, driving, shopping, and community activity  PERSONAL FACTORS: Age, Time since onset of injury/illness/exacerbation, and 3+ comorbidities: angina, hypothyroidism, neck surgery, neuropathy, R RTC repair, osteoporosis, CAD, history MI, BPPV, chronic low back pain  are also affecting patient's functional outcome.   REHAB POTENTIAL:  Good  CLINICAL DECISION MAKING: Evolving/moderate complexity  EVALUATION COMPLEXITY: Moderate   GOALS: Goals reviewed with patient? Yes  SHORT TERM GOALS: Target date: 11/20/2022   Patient will be independent with initial HEP.  Baseline: given Goal status: IN PROGRESS   LONG TERM GOALS: Target date: 01/01/2023   Patient will be independent with advanced/ongoing HEP to improve outcomes and carryover.  Baseline:  Goal status: INITIAL  2.  Patient will report 75% improvement in neck pain to improve QOL.  Baseline: severe Goal status: IN PROGRESS  3.  Patient will demonstrate improved cervical ROM by 10 deg all directions for safety with driving.  Baseline: see objective Goal status: IN PROGRESS  4.  Patient will report 15% improvement on NDI to demonstrate improved functional ability.  Baseline: 46% Goal status: IN PROGRESS  5.  Patient will report 75% improvement in low back pain to improve quality of life. Baseline:   Goal status: IN PROGRESS  6. Patient  will report 12% improvement on modified Oswestry to demonstrate improved functional ability. Baseline: TBA Goal status: IN PROGRESS   7. Patient will will be able to return to community-based exercise program to maintain progress. Baseline: stopped exercising due to pain Goal status: IN PROGRESS    PLAN:  PT FREQUENCY: 1-2x/week  PT DURATION: 8 weeks  PLANNED INTERVENTIONS: Therapeutic exercises, Therapeutic activity, Neuromuscular re-education, Balance training, Gait training, Patient/Family education, Self Care, Joint mobilization, Stair training, Dry Needling, Electrical stimulation, Spinal mobilization, Cryotherapy, Moist heat, Taping, Traction, Ultrasound, Manual therapy, and Re-evaluation  PLAN FOR NEXT SESSION: evaluate back; review and progress HEP including sciatic nerve stretches, manual therapy and modalities PRN.    Artist Pais, PTA 11/13/2022, 10:16 AM

## 2022-11-21 ENCOUNTER — Telehealth: Payer: Self-pay | Admitting: Family Medicine

## 2022-11-21 ENCOUNTER — Other Ambulatory Visit: Payer: Self-pay | Admitting: Family Medicine

## 2022-11-21 ENCOUNTER — Ambulatory Visit: Payer: Medicare Other

## 2022-11-21 DIAGNOSIS — G8929 Other chronic pain: Secondary | ICD-10-CM

## 2022-11-21 DIAGNOSIS — R293 Abnormal posture: Secondary | ICD-10-CM

## 2022-11-21 DIAGNOSIS — R252 Cramp and spasm: Secondary | ICD-10-CM | POA: Diagnosis not present

## 2022-11-21 DIAGNOSIS — M542 Cervicalgia: Secondary | ICD-10-CM

## 2022-11-21 DIAGNOSIS — M25579 Pain in unspecified ankle and joints of unspecified foot: Secondary | ICD-10-CM

## 2022-11-21 DIAGNOSIS — M5441 Lumbago with sciatica, right side: Secondary | ICD-10-CM | POA: Diagnosis not present

## 2022-11-21 NOTE — Telephone Encounter (Signed)
Pt is wanting to be referral to podiatrists - for her issues with ankle-pt states already has seen PA Vander recently (10-29-2022) for this issue and wanted to know if possible to get referral to podiatrist since issue is still going on. Pt tel [phone]. Please advise.

## 2022-11-21 NOTE — Therapy (Signed)
OUTPATIENT PHYSICAL THERAPY TREATMENT   Patient Name: [name] MRN: [id number] DOB:[birth year], 76 y.o., female Today's Date: 11/21/2022   PT End of Session - 11/21/22 1156     Visit Number 4    Number of Visits 12    Date for PT Re-Evaluation 12/18/22    Authorization Type [employer] Medicare    Progress Note Due on Visit 10    PT Start Time 1031    PT Stop Time 1113    PT Time Calculation (min) 42 min    Activity Tolerance Patient tolerated treatment well    Behavior During Therapy [facility] for tasks assessed/performed               Past Medical History:  Diagnosis Date   Acute MI, subendocardial ([place])    Arthritis    Back pain 05/31/2013   Benign paroxysmal positional vertigo 08/05/2016   CAD (coronary artery disease)    a. NSTEMI 10/2011: [place] 11/19/11: pLAD 30%, oOM 60%, AVCFX 30%, CFX after OM2 30%, pRCA 60 and 70%, then 99%, AM 80-90% with TIMI 3 flow.  PCI: Promus DES x 2 to RCA; b. 06/2012 Cath: patent RCA stents w/ subtl occl of Acute Marginal (jailed)->Med rx; c. 05/2015 MV: EF 59%, mod mid infsept/inf/ap lat/ap infarct with peri-infarct isch-->Med Rx; d. 02/2016 Cath: LM nl, LAD 71m RI 50, RCA patent stents.   Colitis    Facial skin lesion 01/28/2017   GERD (gastroesophageal reflux disease)    occasional   Hyperlipidemia    Hypertension    Hypertensive heart disease    a. Echocardiogram 11/19/11: Difficult acoustic windows, EF 60-65%, normal LV wall thickness, grade 1 diastolic dysfunction   Hypoparathyroidism (HCC)    Low zinc level 11/26/2016   Multinodular thyroid    Goiter s/p thyroidectomy in 2007 with post-op  hypocalcemia and post-op hypothyroidism/hypoparathyroidism with hypocalcemia   Neck pain on right side 07/13/2013   Nocturia 05/31/2013   Osteoarthritis    Pain in right axilla 03/13/2016   Palpitations    a. 03/2012 - patient set up for event monitor but did not wear correctly -  she declined wearing a repeat monitor   Post-surgical hypothyroidism     Sun-damaged skin 12/05/2014   Tubular adenoma of colon 06/2011   Unspecified constipation 05/31/2013   Vertigo    Zinc deficiency 11/26/2015   Past Surgical History:  Procedure Laterality Date   CARDIAC CATHETERIZATION N/A 03/08/2016   Procedure: Left Heart Cath and Coronary Angiography;  Surgeon: JJettie Booze MD;  [facility];  Service: Cardiovascular;  Laterality: N/A;   COLONOSCOPY  Aenomatous polyps   07/05/2011   COLONOSCOPY N/A 09/28/2014   Procedure: COLONOSCOPY;  Surgeon: MLadene Artist MD;  Location: [facility];  Service: Endoscopy;  Laterality: N/A;   CORONARY ANGIOPLASTY WITH STENT PLACEMENT     LEFT HEART CATH AND CORONARY ANGIOGRAPHY N/A 12/09/2018   Procedure: LEFT HEART CATH AND CORONARY ANGIOGRAPHY;  Surgeon: VJettie Booze MD;  [facility];  Service: Cardiovascular;  Laterality: N/A;   LEFT HEART CATHETERIZATION WITH CORONARY ANGIOGRAM N/A 07/17/2012   Procedure: LEFT HEART CATHETERIZATION WITH CORONARY ANGIOGRAM;  Surgeon: THillary Bow MD;  [facility];  Service: Cardiovascular;  Laterality: N/A;   PERCUTANEOUS CORONARY STENT INTERVENTION (PCI-S) N/A 11/19/2011   Procedure: PERCUTANEOUS CORONARY STENT INTERVENTION (PCI-S);  Surgeon: THillary Bow MD;  [facility];  Service: Cardiovascular;  Laterality: N/A;   POLYPECTOMY  SHOULDER ARTHROSCOPY W/ ROTATOR CUFF REPAIR     right   TOTAL THYROIDECTOMY  2007   GOITER   Patient Active Problem List   Diagnosis Date Noted   Paresthesia 08/28/2022   Chronic neck pain 08/28/2022   Neuropathy 08/21/2022   Vitamin deficiency 10/23/2021   Hypoglycemia 10/07/2019   Heart disease 01/29/2019   Sensorineural hearing loss (SNHL) of both ears 01/27/2019   Primary osteoarthritis of both knees 12/19/2018   DDD (degenerative disc disease), lumbar 12/19/2018   ANA positive 10/27/2018   Anxiety 02/17/2018   Peripheral arterial disease ([place]) 02/20/2017    Skin lesion of face 01/28/2017   Benign paroxysmal positional vertigo 08/05/2016   Antiplatelet or antithrombotic long-term use 03/27/2016   Right lumbar radiculopathy 02/21/2016   Abnormality of gait 02/21/2016   Zinc deficiency 11/26/2015   Right hip pain 10/26/2015   Medicare annual wellness visit, subsequent 03/13/2015   Preventative health care 03/13/2015   Sun-damaged skin 12/05/2014   Angina of effort 12/02/2014   Hx of colonic polyps 09/28/2014   Benign neoplasm of descending colon 09/28/2014   Other fatigue 09/10/2014   Personal history of colonic polyps 07/16/2014   Bilateral thumb pain 03/20/2014   Postsurgical hypoparathyroidism ([place]) 08/03/2013   Neck pain on right side 07/13/2013   Constipation 05/31/2013   Back pain 05/31/2013   Nocturia 05/31/2013   GERD (gastroesophageal reflux disease) 11/02/2012   Tension headache 11/02/2012   Anemia 06/11/2012   Hypokalemia 06/11/2012   Depression 01/21/2012   Headache(784.0) 12/23/2011   Bradycardia 12/07/2011   Hypocalcemia 12/07/2011   Acute MI, subendocardial ([place]) 11/20/2011   Hypothyroidism 05/20/2009   Essential hypertension, benign 05/20/2009   Hyperlipidemia 03/08/2009   CAD, NATIVE VESSEL 03/08/2009    PCP: Mosie Lukes, MD  REFERRING PROVIDER: Glenford Peers, NP  REFERRING DIAG: [phone] (ICD-10-CM) - Other spondylosis with radiculopathy, cervical region  THERAPY DIAG:  Cervicalgia  Chronic right-sided low back pain with right-sided sciatica  Abnormal posture  Cramp and spasm  Rationale for Evaluation and Treatment: Rehabilitation  ONSET DATE: chronic for years  SUBJECTIVE:                                                                                                                                                                                                         SUBJECTIVE STATEMENT: Pt reports doing heavy work yesterday, now she is sore in the back.  PERTINENT HISTORY:  Surgical  hypoparathyrodism, angina (has nitro), Hypothyroidism, hyperlipidemia, HTN, neuropathy hands and feet, R RTC repair  PAIN:  Are you having pain?  Yes: NPRS scale: 0/10 Pain location: both side of neck to shoulders Pain description: sometimes sharp shooting, sometimes arms feel weak, constant pain Aggravating factors: moving head overhead, reaching behind her back, turning neck (gets dizzy),  Relieving factors: holding arms close, avoiding movement.   Are you having pain? Yes: NPRS scale: 6/10 Pain location: low back, down R side to mid calf, sometimes numbness in feet with prolonged standing Pain description: pain and numbness Aggravating factors: bending over Relieving factors: laying down, icy/hot and patches, hot showers  PRECAUTIONS: None  WEIGHT BEARING RESTRICTIONS: No  FALLS:  Has patient fallen in last 6 months? No  LIVING ENVIRONMENT: Lives with: lives alone Lives in: House/apartment Stairs: No Has following equipment at home: None  OCCUPATION: retired  PLOF: Independent and Leisure: was going to [employer] but stopped due to pain  PATIENT GOALS: I want to be pain free without medications, know what machines are safe to use  NEXT MD VISIT: today with PCP  OBJECTIVE:   DIAGNOSTIC FINDINGS:  MRI done but not available to review  PATIENT SURVEYS:  NDI 23/50 = 46%  moderate disability   COGNITION: Overall cognitive status: Within functional limits for tasks assessed  SENSATION: Light touch: WFL  POSTURE: rounded shoulders and forward head  PALPATION: Tenderness throughout bil upper traps, levator scapulae, and cervical paraspinals, wincing and jumping away from firm touch.    CERVICAL ROM:   Active ROM AROM (deg) eval  Flexion 15  Extension 20  Right lateral flexion 10  Left lateral flexion 15  Right rotation 40  Left rotation 40   (Blank rows = not tested)  UPPER EXTREMITY MMT:   MMT Right eval Left eval  Shoulder flexion 4+ 4+  Shoulder  extension    Shoulder abduction 4+* 4  Shoulder adduction    Shoulder internal rotation 5 5  Shoulder external rotation 5 5  Elbow flexion 5 5  Elbow extension 5 5  Wrist flexion 5 5  Wrist extension 5 5   (Blank rows = not tested)  UPPER EXTREMITY ROM:  MMT Right eval Left eval  Shoulder flexion 115* 120  Shoulder extension 40 65  Shoulder abduction 100 135  Shoulder adduction    Shoulder internal rotation    Shoulder external rotation     (Blank rows = not tested)  LOWER EXTREMITY MMT:    MMT Right eval Left eval  Hip flexion 4 4  Hip extension    Hip abduction 5 5  Hip adduction 4+ 4+  Hip internal rotation    Hip external rotation    Knee flexion    Knee extension 5 5  Ankle dorsiflexion 5 5  Ankle plantarflexion     (Blank rows = not tested)   LUMBAR ROM:   Active  AROM  11/09/2022  Flexion To mid-thigh  Extension WNL, no pain, but increased pain after repeated 5x  Right lateral flexion Inc pain R side back, to knee  Left lateral flexion To mid thigh, increased pain R side back  Right rotation WNL  Left rotation WNL   (Blank rows = not tested)   CERVICAL SPECIAL TESTS:  Spurling's test: Positive right  FUNCTIONAL TESTS:  5 times sit to stand: 35 seconds Increased sciatic pain with R leg extension in sitting.   TODAY'S TREATMENT:  DATE:  11/21/2022 Therapeutic Exercise: to improve strength and mobility.  Demo, verbal and tactile cues throughout for technique. Recumbent BIKE L2x55mn Seated cervical ext with pillow case x 10 Seated cervical rotation pillowcase x 10 Supine bridge x 10  LTR x 10 bil Supine KTOS and figure 4 stretch 3x15"  Manual Therapy: to decrease muscle spasm and pain and improve mobility  STM to R glutes and piriformis   11/09/2022 Therapeutic Exercise: to improve strength and mobility.  Demo, verbal  and tactile cues throughout for technique. Nustep L4 x 7 min LTR x 10 bil PPT 10x 3 sec hold Bridges with TrA brace 2x10 Piriformis stretch 2x30 sec Supine clams RTB 2x10 with TrA brace Supine march RTB x 10 bil SLR x 10 Bil Seated cervical ext with pillow case x 10  Seated cervical AA rotation with pillow case x 10 bil  11/09/2022 Therapeutic Exercise: to improve strength and mobility.  Demo, verbal and tactile cues throughout for technique. Nustep L4 x 7 min LTR x 10 PPT 10 x 3 sec hold - cues for breathing  Bridges with TrA 2 x 10  Piriformis stretch bil Supine clams with TrA x 10, added RTB x 10  Sidelying clams x 10  R side only  Supine open book leading with R elbow x 10 Seated cervical snags with pillow case into extension and rotation.  Manual Therapy: to decrease muscle spasm and pain and improve mobility STM/TPR to bil cervical paraspinals,UT, levator.  Very tender on R UT, responded better to MFR to release.   11/06/2022 Self Care: see patient education     PATIENT EDUCATION:  Education details: HEP update 11/09/22.  Person educated: Patient Education method: Explanation, Demonstration, Verbal cues, and Handouts Education comprehension: verbalized understanding and returned demonstration  HOME EXERCISE PROGRAM: Access Code: [email or web address] URL: [email or web address] Date: 11/09/2022 Prepared by: EGlenetta Hew Exercises - Seated Cervical Retraction  - 3 x daily - 7 x weekly - 1 sets - 10 reps - Seated Scapular Retraction  - 3 x daily - 7 x weekly - 1 sets - 10 reps - Seated Shoulder Rolls  - 3 x daily - 7 x weekly - 1 sets - 10 reps - Cervical Extension AROM with Strap  - 1 x daily - 7 x weekly - 1 sets - 10 reps - Seated Assisted Cervical Rotation with Towel  - 1 x daily - 7 x weekly - 1 sets - 10 reps - Standing Thoracic Open [place] - 1 x daily - 7 x weekly - 1 sets - 10 reps - Supine Posterior Pelvic Tilt  - 1 x daily - 7 x weekly - 2  sets - 10 reps - 3 sec  hold - Supine Transversus Abdominis Bracing with Double Leg Fallout  - 1 x daily - 7 x weekly - 2 sets - 10 reps - Clamshell  - 1 x daily - 7 x weekly - 1 sets - 10 reps  ASSESSMENT:  CLINICAL IMPRESSION: Patina noted some residual soreness and pain from doing heavy work yesterday. With majority of the exercises initially she showed visible discomfort, pointing to R glute area, which shifted focus to MT. She was extremely tender to touch in the glute med and piriformis but after MT demonstrated improvement in muscle tension and tenderness. Only mild clarification required with cervical rotation using pillow case otherwise no issues with HEP - STG is now met.    OBJECTIVE IMPAIRMENTS: decreased activity tolerance, decreased mobility,  decreased ROM, decreased strength, dizziness, hypomobility, increased fascial restrictions, impaired perceived functional ability, increased muscle spasms, impaired flexibility, postural dysfunction, and pain.   ACTIVITY LIMITATIONS: carrying, lifting, bending, standing, sleeping, stairs, transfers, dressing, reach over head, and hygiene/grooming  PARTICIPATION LIMITATIONS: meal prep, cleaning, laundry, driving, shopping, and community activity  PERSONAL FACTORS: Age, Time since onset of injury/illness/exacerbation, and 3+ comorbidities: angina, hypothyroidism, neck surgery, neuropathy, R RTC repair, osteoporosis, CAD, history MI, BPPV, chronic low back pain  are also affecting patient's functional outcome.   REHAB POTENTIAL: Good  CLINICAL DECISION MAKING: Evolving/moderate complexity  EVALUATION COMPLEXITY: Moderate   GOALS: Goals reviewed with patient? Yes  SHORT TERM GOALS: Target date: 11/20/2022   Patient will be independent with initial HEP.  Baseline: given Goal status: MET - 11/21/22   LONG TERM GOALS: Target date: 01/01/2023   Patient will be independent with advanced/ongoing HEP to improve outcomes and carryover.   Baseline:  Goal status: INITIAL  2.  Patient will report 75% improvement in neck pain to improve QOL.  Baseline: severe Goal status: IN PROGRESS  3.  Patient will demonstrate improved cervical ROM by 10 deg all directions for safety with driving.  Baseline: see objective Goal status: IN PROGRESS  4.  Patient will report 15% improvement on NDI to demonstrate improved functional ability.  Baseline: 46% Goal status: IN PROGRESS  5.  Patient will report 75% improvement in low back pain to improve quality of life. Baseline:   Goal status: IN PROGRESS  6. Patient will report 12% improvement on modified Oswestry to demonstrate improved functional ability. Baseline: TBA Goal status: IN PROGRESS   7. Patient will will be able to return to community-based exercise program to maintain progress. Baseline: stopped exercising due to pain Goal status: IN PROGRESS    PLAN:  PT FREQUENCY: 1-2x/week  PT DURATION: 8 weeks  PLANNED INTERVENTIONS: Therapeutic exercises, Therapeutic activity, Neuromuscular re-education, Balance training, Gait training, Patient/Family education, Self Care, Joint mobilization, Stair training, Dry Needling, Electrical stimulation, Spinal mobilization, Cryotherapy, Moist heat, Taping, Traction, Ultrasound, Manual therapy, and Re-evaluation  PLAN FOR NEXT SESSION: DN to glutes and piriformis; review and progress HEP including sciatic nerve stretches, manual therapy and modalities PRN.    Artist Pais, PTA 11/21/2022, 11:59 AM

## 2022-11-27 ENCOUNTER — Ambulatory Visit: Payer: Medicare Other | Attending: Student

## 2022-11-27 DIAGNOSIS — M542 Cervicalgia: Secondary | ICD-10-CM | POA: Diagnosis not present

## 2022-11-27 DIAGNOSIS — G8929 Other chronic pain: Secondary | ICD-10-CM

## 2022-11-27 DIAGNOSIS — R252 Cramp and spasm: Secondary | ICD-10-CM

## 2022-11-27 DIAGNOSIS — M5441 Lumbago with sciatica, right side: Secondary | ICD-10-CM | POA: Diagnosis not present

## 2022-11-27 DIAGNOSIS — R293 Abnormal posture: Secondary | ICD-10-CM | POA: Diagnosis not present

## 2022-11-27 NOTE — Therapy (Signed)
OUTPATIENT PHYSICAL THERAPY TREATMENT   Patient Name: [name] MRN: [id number] DOB:28-Jan-1946, 76 y.o., female Today's Date: 11/27/2022   PT End of Session - 11/27/22 1127     Visit Number 5    Number of Visits 12    Date for PT Re-Evaluation 12/18/22    Authorization Type [employer] Medicare    Progress Note Due on Visit 10    PT Start Time 1016    PT Stop Time 1102    PT Time Calculation (min) 46 min    Activity Tolerance Patient tolerated treatment well    Behavior During Therapy [facility] for tasks assessed/performed                Past Medical History:  Diagnosis Date   Acute MI, subendocardial ([place])    Arthritis    Back pain 05/31/2013   Benign paroxysmal positional vertigo 08/05/2016   CAD (coronary artery disease)    a. NSTEMI 10/2011: [place] 11/19/11: pLAD 30%, oOM 60%, AVCFX 30%, CFX after OM2 30%, pRCA 60 and 70%, then 99%, AM 80-90% with TIMI 3 flow.  PCI: Promus DES x 2 to RCA; b. 06/2012 Cath: patent RCA stents w/ subtl occl of Acute Marginal (jailed)->Med rx; c. 05/2015 MV: EF 59%, mod mid infsept/inf/ap lat/ap infarct with peri-infarct isch-->Med Rx; d. 02/2016 Cath: LM nl, LAD 74m RI 50, RCA patent stents.   Colitis    Facial skin lesion 01/28/2017   GERD (gastroesophageal reflux disease)    occasional   Hyperlipidemia    Hypertension    Hypertensive heart disease    a. Echocardiogram 11/19/11: Difficult acoustic windows, EF 60-65%, normal LV wall thickness, grade 1 diastolic dysfunction   Hypoparathyroidism (HCC)    Low zinc level 11/26/2016   Multinodular thyroid    Goiter s/p thyroidectomy in 2007 with post-op  hypocalcemia and post-op hypothyroidism/hypoparathyroidism with hypocalcemia   Neck pain on right side 07/13/2013   Nocturia 05/31/2013   Osteoarthritis    Pain in right axilla 03/13/2016   Palpitations    a. 03/2012 - patient set up for event monitor but did not wear correctly -  she declined wearing a repeat monitor   Post-surgical hypothyroidism     Sun-damaged skin 12/05/2014   Tubular adenoma of colon 06/2011   Unspecified constipation 05/31/2013   Vertigo    Zinc deficiency 11/26/2015   Past Surgical History:  Procedure Laterality Date   CARDIAC CATHETERIZATION N/A 03/08/2016   Procedure: Left Heart Cath and Coronary Angiography;  Surgeon: JJettie Booze MD;  [facility];  Service: Cardiovascular;  Laterality: N/A;   COLONOSCOPY  Aenomatous polyps   07/05/2011   COLONOSCOPY N/A 09/28/2014   Procedure: COLONOSCOPY;  Surgeon: MLadene Artist MD;  Location: [facility];  Service: Endoscopy;  Laterality: N/A;   CORONARY ANGIOPLASTY WITH STENT PLACEMENT     LEFT HEART CATH AND CORONARY ANGIOGRAPHY N/A 12/09/2018   Procedure: LEFT HEART CATH AND CORONARY ANGIOGRAPHY;  Surgeon: VJettie Booze MD;  [place] [facility];  Service: Cardiovascular;  Laterality: N/A;   LEFT HEART CATHETERIZATION WITH CORONARY ANGIOGRAM N/A 07/17/2012   Procedure: LEFT HEART CATHETERIZATION WITH CORONARY ANGIOGRAM;  Surgeon: THillary Bow MD;  [facility];  Service: Cardiovascular;  Laterality: N/A;   PERCUTANEOUS CORONARY STENT INTERVENTION (PCI-S) N/A 11/19/2011   Procedure: PERCUTANEOUS CORONARY STENT INTERVENTION (PCI-S);  Surgeon: THillary Bow MD;  [facility];  Service: Cardiovascular;  Laterality: N/A;   POLYPECTOMY  SHOULDER ARTHROSCOPY W/ ROTATOR CUFF REPAIR     right   TOTAL THYROIDECTOMY  2007   GOITER   Patient Active Problem List   Diagnosis Date Noted   Paresthesia 08/28/2022   Chronic neck pain 08/28/2022   Neuropathy 08/21/2022   Vitamin deficiency 10/23/2021   Hypoglycemia 10/07/2019   Heart disease 01/29/2019   Sensorineural hearing loss (SNHL) of both ears 01/27/2019   Primary osteoarthritis of both knees 12/19/2018   DDD (degenerative disc disease), lumbar 12/19/2018   ANA positive 10/27/2018   Anxiety 02/17/2018   Peripheral arterial disease ([place]) 02/20/2017    Skin lesion of face 01/28/2017   Benign paroxysmal positional vertigo 08/05/2016   Antiplatelet or antithrombotic long-term use 03/27/2016   Right lumbar radiculopathy 02/21/2016   Abnormality of gait 02/21/2016   Zinc deficiency 11/26/2015   Right hip pain 10/26/2015   Medicare annual wellness visit, subsequent 03/13/2015   Preventative health care 03/13/2015   Sun-damaged skin 12/05/2014   Angina of effort 12/02/2014   Hx of colonic polyps 09/28/2014   Benign neoplasm of descending colon 09/28/2014   Other fatigue 09/10/2014   Personal history of colonic polyps 07/16/2014   Bilateral thumb pain 03/20/2014   Postsurgical hypoparathyroidism ([place]) 08/03/2013   Neck pain on right side 07/13/2013   Constipation 05/31/2013   Back pain 05/31/2013   Nocturia 05/31/2013   GERD (gastroesophageal reflux disease) 11/02/2012   Tension headache 11/02/2012   Anemia 06/11/2012   Hypokalemia 06/11/2012   Depression 01/21/2012   Headache(784.0) 12/23/2011   Bradycardia 12/07/2011   Hypocalcemia 12/07/2011   Acute MI, subendocardial ([place]) 11/20/2011   Hypothyroidism 05/20/2009   Essential hypertension, benign 05/20/2009   Hyperlipidemia 03/08/2009   CAD, NATIVE VESSEL 03/08/2009    PCP: Mosie Lukes, MD  REFERRING PROVIDER: Glenford Peers, NP  REFERRING DIAG: [phone] (ICD-10-CM) - Other spondylosis with radiculopathy, cervical region  THERAPY DIAG:  Cervicalgia  Chronic right-sided low back pain with right-sided sciatica  Abnormal posture  Cramp and spasm  Rationale for Evaluation and Treatment: Rehabilitation  ONSET DATE: chronic for years  SUBJECTIVE:                                                                                                                                                                                                         SUBJECTIVE STATEMENT: Pt feels that her back was better after STM last session.  PERTINENT HISTORY:  Surgical  hypoparathyrodism, angina (has nitro), Hypothyroidism, hyperlipidemia, HTN, neuropathy hands and feet, R RTC repair  PAIN:  Are you having pain? Yes: NPRS  scale: 7/10 Pain location: both side of neck to shoulders Pain description: sometimes sharp shooting, sometimes arms feel weak, constant pain Aggravating factors: moving head overhead, reaching behind her back, turning neck (gets dizzy),  Relieving factors: holding arms close, avoiding movement.   Are you having pain? Yes: NPRS scale: 6/10 Pain location: low back, down R side to mid calf, sometimes numbness in feet with prolonged standing Pain description: pain and numbness Aggravating factors: bending over Relieving factors: laying down, icy/hot and patches, hot showers  PRECAUTIONS: None  WEIGHT BEARING RESTRICTIONS: No  FALLS:  Has patient fallen in last 6 months? No  LIVING ENVIRONMENT: Lives with: lives alone Lives in: House/apartment Stairs: No Has following equipment at home: None  OCCUPATION: retired  PLOF: Independent and Leisure: was going to [employer] but stopped due to pain  PATIENT GOALS: I want to be pain free without medications, know what machines are safe to use  NEXT MD VISIT: today with PCP  OBJECTIVE:   DIAGNOSTIC FINDINGS:  MRI done but not available to review  PATIENT SURVEYS:  NDI 23/50 = 46%  moderate disability   COGNITION: Overall cognitive status: Within functional limits for tasks assessed  SENSATION: Light touch: WFL  POSTURE: rounded shoulders and forward head  PALPATION: Tenderness throughout bil upper traps, levator scapulae, and cervical paraspinals, wincing and jumping away from firm touch.    CERVICAL ROM:   Active ROM AROM (deg) eval  Flexion 15  Extension 20  Right lateral flexion 10  Left lateral flexion 15  Right rotation 40  Left rotation 40   (Blank rows = not tested)  UPPER EXTREMITY MMT:   MMT Right eval Left eval  Shoulder flexion 4+ 4+  Shoulder  extension    Shoulder abduction 4+* 4  Shoulder adduction    Shoulder internal rotation 5 5  Shoulder external rotation 5 5  Elbow flexion 5 5  Elbow extension 5 5  Wrist flexion 5 5  Wrist extension 5 5   (Blank rows = not tested)  UPPER EXTREMITY ROM:  MMT Right eval Left eval  Shoulder flexion 115* 120  Shoulder extension 40 65  Shoulder abduction 100 135  Shoulder adduction    Shoulder internal rotation    Shoulder external rotation     (Blank rows = not tested)  LOWER EXTREMITY MMT:    MMT Right eval Left eval  Hip flexion 4 4  Hip extension    Hip abduction 5 5  Hip adduction 4+ 4+  Hip internal rotation    Hip external rotation    Knee flexion    Knee extension 5 5  Ankle dorsiflexion 5 5  Ankle plantarflexion     (Blank rows = not tested)   LUMBAR ROM:   Active  AROM  11/09/2022  Flexion To mid-thigh  Extension WNL, no pain, but increased pain after repeated 5x  Right lateral flexion Inc pain R side back, to knee  Left lateral flexion To mid thigh, increased pain R side back  Right rotation WNL  Left rotation WNL   (Blank rows = not tested)   CERVICAL SPECIAL TESTS:  Spurling's test: Positive right  FUNCTIONAL TESTS:  5 times sit to stand: 35 seconds Increased sciatic pain with R leg extension in sitting.   TODAY'S TREATMENT:  DATE:  11/27/2022 Therapeutic Exercise: to improve strength and mobility.  Demo, verbal and tactile cues throughout for technique. Recumbent Bike L2x18mn SKTC stretch 2x30 sec  Supine KTOS stretch 2x30 sec bil Bridge with RTB x 10  Clamshell RTB x 10 R/L S/L Seated row blue TB x 10   Manual Therapy: STM to R glutes and piriformis with foam roll  11/21/2022 Therapeutic Exercise: to improve strength and mobility.  Demo, verbal and tactile cues throughout for technique. Recumbent BIKE  L2x76m Seated cervical ext with pillow case x 10 Seated cervical rotation pillowcase x 10 Supine bridge x 10  LTR x 10 bil Supine KTOS and figure 4 stretch 3x15"  Manual Therapy: to decrease muscle spasm and pain and improve mobility  STM to R glutes and piriformis   11/09/2022 Therapeutic Exercise: to improve strength and mobility.  Demo, verbal and tactile cues throughout for technique. Nustep L4 x 7 min LTR x 10 bil PPT 10x 3 sec hold Bridges with TrA brace 2x10 Piriformis stretch 2x30 sec Supine clams RTB 2x10 with TrA brace Supine march RTB x 10 bil SLR x 10 Bil Seated cervical ext with pillow case x 10  Seated cervical AA rotation with pillow case x 10 bil  11/09/2022 Therapeutic Exercise: to improve strength and mobility.  Demo, verbal and tactile cues throughout for technique. Nustep L4 x 7 min LTR x 10 PPT 10 x 3 sec hold - cues for breathing  Bridges with TrA 2 x 10  Piriformis stretch bil Supine clams with TrA x 10, added RTB x 10  Sidelying clams x 10  R side only  Supine open book leading with R elbow x 10 Seated cervical snags with pillow case into extension and rotation.  Manual Therapy: to decrease muscle spasm and pain and improve mobility STM/TPR to bil cervical paraspinals,UT, levator.  Very tender on R UT, responded better to MFR to release.   11/06/2022 Self Care: see patient education     PATIENT EDUCATION:  Education details: HEP update 11/09/22.  Person educated: Patient Education method: Explanation, Demonstration, Verbal cues, and Handouts Education comprehension: verbalized understanding and returned demonstration  HOME EXERCISE PROGRAM: Access Code: [email or web address] URL: [email or web address] Date: 11/09/2022 Prepared by: ElGlenetta HewExercises - Seated Cervical Retraction  - 3 x daily - 7 x weekly - 1 sets - 10 reps - Seated Scapular Retraction  - 3 x daily - 7 x weekly - 1 sets - 10 reps - Seated Shoulder Rolls  - 3 x  daily - 7 x weekly - 1 sets - 10 reps - Cervical Extension AROM with Strap  - 1 x daily - 7 x weekly - 1 sets - 10 reps - Seated Assisted Cervical Rotation with Towel  - 1 x daily - 7 x weekly - 1 sets - 10 reps - Standing Thoracic [place]- 1 x daily - 7 x weekly - 1 sets - 10 reps - Supine Posterior Pelvic Tilt  - 1 x daily - 7 x weekly - 2 sets - 10 reps - 3 sec  hold - Supine Transversus Abdominis Bracing with Double Leg Fallout  - 1 x daily - 7 x weekly - 2 sets - 10 reps - Clamshell  - 1 x daily - 7 x weekly - 1 sets - 10 reps  ASSESSMENT:  CLINICAL IMPRESSION: Continued with progression of hip strengthening and lumbpelvic strengthening to improve support for low back. STM was done to the R  glutes to release muscle tension and decrease pain. Pt continued to be very sensitive with trigger points, however was able to tolerate STM with improvement in pain noted. Updated HEP to progress hip strength. She is progressing toward LTG #5. Would plan to keep progressing lumbopelvic strengthening and STM as indicated.   OBJECTIVE IMPAIRMENTS: decreased activity tolerance, decreased mobility, decreased ROM, decreased strength, dizziness, hypomobility, increased fascial restrictions, impaired perceived functional ability, increased muscle spasms, impaired flexibility, postural dysfunction, and pain.   ACTIVITY LIMITATIONS: carrying, lifting, bending, standing, sleeping, stairs, transfers, dressing, reach over head, and hygiene/grooming  PARTICIPATION LIMITATIONS: meal prep, cleaning, laundry, driving, shopping, and community activity  PERSONAL FACTORS: Age, Time since onset of injury/illness/exacerbation, and 3+ comorbidities: angina, hypothyroidism, neck surgery, neuropathy, R RTC repair, osteoporosis, CAD, history MI, BPPV, chronic low back pain  are also affecting patient's functional outcome.   REHAB POTENTIAL: Good  CLINICAL DECISION MAKING: Evolving/moderate complexity  EVALUATION  COMPLEXITY: Moderate   GOALS: Goals reviewed with patient? Yes  SHORT TERM GOALS: Target date: 11/20/2022   Patient will be independent with initial HEP.  Baseline: given Goal status: MET - 11/21/22   LONG TERM GOALS: Target date: 01/01/2023   Patient will be independent with advanced/ongoing HEP to improve outcomes and carryover.  Baseline:  Goal status: INITIAL  2.  Patient will report 75% improvement in neck pain to improve QOL.  Baseline: severe Goal status: IN PROGRESS  3.  Patient will demonstrate improved cervical ROM by 10 deg all directions for safety with driving.  Baseline: see objective Goal status: IN PROGRESS  4.  Patient will report 15% improvement on NDI to demonstrate improved functional ability.  Baseline: 46% Goal status: IN PROGRESS  5.  Patient will report 75% improvement in low back pain to improve quality of life. Baseline:   Goal status: IN PROGRESS- 60-70% improvement 12/5  6. Patient will report 12% improvement on modified Oswestry to demonstrate improved functional ability. Baseline: TBA Goal status: IN PROGRESS   7. Patient will will be able to return to community-based exercise program to maintain progress. Baseline: stopped exercising due to pain Goal status: IN PROGRESS    PLAN:  PT FREQUENCY: 1-2x/week  PT DURATION: 8 weeks  PLANNED INTERVENTIONS: Therapeutic exercises, Therapeutic activity, Neuromuscular re-education, Balance training, Gait training, Patient/Family education, Self Care, Joint mobilization, Stair training, Dry Needling, Electrical stimulation, Spinal mobilization, Cryotherapy, Moist heat, Taping, Traction, Ultrasound, Manual therapy, and Re-evaluation  PLAN FOR NEXT SESSION: DN to glutes and piriformis; review and progress HEP including sciatic nerve stretches, manual therapy and modalities PRN.    Artist Pais, PTA 11/27/2022, 11:27 AM

## 2022-11-30 ENCOUNTER — Ambulatory Visit: Payer: Medicare Other

## 2022-11-30 DIAGNOSIS — R252 Cramp and spasm: Secondary | ICD-10-CM

## 2022-11-30 DIAGNOSIS — R293 Abnormal posture: Secondary | ICD-10-CM | POA: Diagnosis not present

## 2022-11-30 DIAGNOSIS — M542 Cervicalgia: Secondary | ICD-10-CM | POA: Diagnosis not present

## 2022-11-30 DIAGNOSIS — M5441 Lumbago with sciatica, right side: Secondary | ICD-10-CM | POA: Diagnosis not present

## 2022-11-30 DIAGNOSIS — G8929 Other chronic pain: Secondary | ICD-10-CM | POA: Diagnosis not present

## 2022-11-30 NOTE — Therapy (Signed)
OUTPATIENT PHYSICAL THERAPY TREATMENT   Patient Name: [name] MRN: [id number] DOB:1946/02/11, 76 y.o., female Today's Date: 11/30/2022   PT End of Session - 11/30/22 1104     Visit Number 6    Number of Visits 12    Date for PT Re-Evaluation 12/18/22    Authorization Type [employer] Medicare    Progress Note Due on Visit 10    PT Start Time 1018    PT Stop Time 1102    PT Time Calculation (min) 44 min    Activity Tolerance Patient tolerated treatment well    Behavior During Therapy [facility] for tasks assessed/performed                 Past Medical History:  Diagnosis Date   Acute MI, subendocardial ([place])    Arthritis    Back pain 05/31/2013   Benign paroxysmal positional vertigo 08/05/2016   CAD (coronary artery disease)    a. NSTEMI 10/2011: [place] 11/19/11: pLAD 30%, oOM 60%, AVCFX 30%, CFX after OM2 30%, pRCA 60 and 70%, then 99%, AM 80-90% with TIMI 3 flow.  PCI: Promus DES x 2 to RCA; b. 06/2012 Cath: patent RCA stents w/ subtl occl of Acute Marginal (jailed)->Med rx; c. 05/2015 MV: EF 59%, mod mid infsept/inf/ap lat/ap infarct with peri-infarct isch-->Med Rx; d. 02/2016 Cath: LM nl, LAD 28m RI 50, RCA patent stents.   Colitis    Facial skin lesion 01/28/2017   GERD (gastroesophageal reflux disease)    occasional   Hyperlipidemia    Hypertension    Hypertensive heart disease    a. Echocardiogram 11/19/11: Difficult acoustic windows, EF 60-65%, normal LV wall thickness, grade 1 diastolic dysfunction   Hypoparathyroidism (HCC)    Low zinc level 11/26/2016   Multinodular thyroid    Goiter s/p thyroidectomy in 2007 with post-op  hypocalcemia and post-op hypothyroidism/hypoparathyroidism with hypocalcemia   Neck pain on right side 07/13/2013   Nocturia 05/31/2013   Osteoarthritis    Pain in right axilla 03/13/2016   Palpitations    a. 03/2012 - patient set up for event monitor but did not wear correctly -  she declined wearing a repeat monitor   Post-surgical hypothyroidism     Sun-damaged skin 12/05/2014   Tubular adenoma of colon 06/2011   Unspecified constipation 05/31/2013   Vertigo    Zinc deficiency 11/26/2015   Past Surgical History:  Procedure Laterality Date   CARDIAC CATHETERIZATION N/A 03/08/2016   Procedure: Left Heart Cath and Coronary Angiography;  Surgeon: JJettie Booze MD;  [facility];  Service: Cardiovascular;  Laterality: N/A;   COLONOSCOPY  Aenomatous polyps   07/05/2011   COLONOSCOPY N/A 09/28/2014   Procedure: COLONOSCOPY;  Surgeon: MLadene Artist MD;  Location: [facility];  Service: Endoscopy;  Laterality: N/A;   CORONARY ANGIOPLASTY WITH STENT PLACEMENT     LEFT HEART CATH AND CORONARY ANGIOGRAPHY N/A 12/09/2018   Procedure: LEFT HEART CATH AND CORONARY ANGIOGRAPHY;  Surgeon: VJettie Booze MD;  [facility];  Service: Cardiovascular;  Laterality: N/A;   LEFT HEART CATHETERIZATION WITH CORONARY ANGIOGRAM N/A 07/17/2012   Procedure: LEFT HEART CATHETERIZATION WITH CORONARY ANGIOGRAM;  Surgeon: THillary Bow MD;  [facility];  Service: Cardiovascular;  Laterality: N/A;   PERCUTANEOUS CORONARY STENT INTERVENTION (PCI-S) N/A 11/19/2011   Procedure: PERCUTANEOUS CORONARY STENT INTERVENTION (PCI-S);  Surgeon: THillary Bow MD;  [facility];  Service: Cardiovascular;  Laterality: N/A;   POLYPECTOMY  SHOULDER ARTHROSCOPY W/ ROTATOR CUFF REPAIR     right   TOTAL THYROIDECTOMY  2007   GOITER   Patient Active Problem List   Diagnosis Date Noted   Paresthesia 08/28/2022   Chronic neck pain 08/28/2022   Neuropathy 08/21/2022   Vitamin deficiency 10/23/2021   Hypoglycemia 10/07/2019   Heart disease 01/29/2019   Sensorineural hearing loss (SNHL) of both ears 01/27/2019   Primary osteoarthritis of both knees 12/19/2018   DDD (degenerative disc disease), lumbar 12/19/2018   ANA positive 10/27/2018   Anxiety 02/17/2018   Peripheral arterial disease ([place]) 02/20/2017    Skin lesion of face 01/28/2017   Benign paroxysmal positional vertigo 08/05/2016   Antiplatelet or antithrombotic long-term use 03/27/2016   Right lumbar radiculopathy 02/21/2016   Abnormality of gait 02/21/2016   Zinc deficiency 11/26/2015   Right hip pain 10/26/2015   Medicare annual wellness visit, subsequent 03/13/2015   Preventative health care 03/13/2015   Sun-damaged skin 12/05/2014   Angina of effort 12/02/2014   Hx of colonic polyps 09/28/2014   Benign neoplasm of descending colon 09/28/2014   Other fatigue 09/10/2014   Personal history of colonic polyps 07/16/2014   Bilateral thumb pain 03/20/2014   Postsurgical hypoparathyroidism ([place]) 08/03/2013   Neck pain on right side 07/13/2013   Constipation 05/31/2013   Back pain 05/31/2013   Nocturia 05/31/2013   GERD (gastroesophageal reflux disease) 11/02/2012   Tension headache 11/02/2012   Anemia 06/11/2012   Hypokalemia 06/11/2012   Depression 01/21/2012   Headache(784.0) 12/23/2011   Bradycardia 12/07/2011   Hypocalcemia 12/07/2011   Acute MI, subendocardial ([place]) 11/20/2011   Hypothyroidism 05/20/2009   Essential hypertension, benign 05/20/2009   Hyperlipidemia 03/08/2009   CAD, NATIVE VESSEL 03/08/2009    PCP: Mosie Lukes, MD  REFERRING PROVIDER: Glenford Peers, NP  REFERRING DIAG: [phone] (ICD-10-CM) - Other spondylosis with radiculopathy, cervical region  THERAPY DIAG:  Cervicalgia  Chronic right-sided low back pain with right-sided sciatica  Abnormal posture  Cramp and spasm  Rationale for Evaluation and Treatment: Rehabilitation  ONSET DATE: chronic for years  SUBJECTIVE:                                                                                                                                                                                                         SUBJECTIVE STATEMENT: Pt reports an improvement in pain from Biofreeze she has been using.  PERTINENT HISTORY:  Surgical  hypoparathyrodism, angina (has nitro), Hypothyroidism, hyperlipidemia, HTN, neuropathy hands and feet, R RTC repair  PAIN:  Are you having pain? Yes:  NPRS scale: 0/10 Pain location: both side of neck to shoulders Pain description: sometimes sharp shooting, sometimes arms feel weak, constant pain Aggravating factors: moving head overhead, reaching behind her back, turning neck (gets dizzy),  Relieving factors: holding arms close, avoiding movement.   Are you having pain? Yes: NPRS scale: 3/10 Pain location: low back, down R side to mid calf, sometimes numbness in feet with prolonged standing Pain description: pain and numbness Aggravating factors: bending over Relieving factors: laying down, icy/hot and patches, hot showers  PRECAUTIONS: None  WEIGHT BEARING RESTRICTIONS: No  FALLS:  Has patient fallen in last 6 months? No  LIVING ENVIRONMENT: Lives with: lives alone Lives in: House/apartment Stairs: No Has following equipment at home: None  OCCUPATION: retired  PLOF: Independent and Leisure: was going to [employer] but stopped due to pain  PATIENT GOALS: I want to be pain free without medications, know what machines are safe to use  NEXT MD VISIT: today with PCP  OBJECTIVE:   DIAGNOSTIC FINDINGS:  MRI done but not available to review  PATIENT SURVEYS:  NDI 23/50 = 46%  moderate disability   COGNITION: Overall cognitive status: Within functional limits for tasks assessed  SENSATION: Light touch: WFL  POSTURE: rounded shoulders and forward head  PALPATION: Tenderness throughout bil upper traps, levator scapulae, and cervical paraspinals, wincing and jumping away from firm touch.    CERVICAL ROM:   Active ROM AROM (deg) eval  Flexion 15  Extension 20  Right lateral flexion 10  Left lateral flexion 15  Right rotation 40  Left rotation 40   (Blank rows = not tested)  UPPER EXTREMITY MMT:   MMT Right eval Left eval  Shoulder flexion 4+ 4+  Shoulder  extension    Shoulder abduction 4+* 4  Shoulder adduction    Shoulder internal rotation 5 5  Shoulder external rotation 5 5  Elbow flexion 5 5  Elbow extension 5 5  Wrist flexion 5 5  Wrist extension 5 5   (Blank rows = not tested)  UPPER EXTREMITY ROM:  MMT Right eval Left eval  Shoulder flexion 115* 120  Shoulder extension 40 65  Shoulder abduction 100 135  Shoulder adduction    Shoulder internal rotation    Shoulder external rotation     (Blank rows = not tested)  LOWER EXTREMITY MMT:    MMT Right eval Left eval  Hip flexion 4 4  Hip extension    Hip abduction 5 5  Hip adduction 4+ 4+  Hip internal rotation    Hip external rotation    Knee flexion    Knee extension 5 5  Ankle dorsiflexion 5 5  Ankle plantarflexion     (Blank rows = not tested)   LUMBAR ROM:   Active  AROM  11/09/2022  Flexion To mid-thigh  Extension WNL, no pain, but increased pain after repeated 5x  Right lateral flexion Inc pain R side back, to knee  Left lateral flexion To mid thigh, increased pain R side back  Right rotation WNL  Left rotation WNL   (Blank rows = not tested)   CERVICAL SPECIAL TESTS:  Spurling's test: Positive right  FUNCTIONAL TESTS:  5 times sit to stand: 35 seconds Increased sciatic pain with R leg extension in sitting.   TODAY'S TREATMENT:  DATE:   11/30/2022 Therapeutic Exercise: to improve strength and mobility.  Demo, verbal and tactile cues throughout for technique. Recumbent Bike L2x63mn  Supine KTOS and figure 4 stretch 2x30 sec bil Bridge with RTB x 12  Clamshell RTB x 10 R/L S/L Supine LTR 15x both ways  Supine march x 10 BLE 3 sec hold RTB  Manual Therapy: STM to R glutes and piriformis with foam roll  11/27/2022 Therapeutic Exercise: to improve strength and mobility.  Demo, verbal and tactile cues throughout for  technique. Recumbent Bike L2x689m SKTC stretch 2x30 sec  Supine KTOS stretch 2x30 sec bil Bridge with RTB x 10  Clamshell RTB x 10 R/L S/L Seated row blue TB x 10   Manual Therapy: STM to R glutes and piriformis with foam roll  11/21/2022 Therapeutic Exercise: to improve strength and mobility.  Demo, verbal and tactile cues throughout for technique. Recumbent BIKE L2x7m60mSeated cervical ext with pillow case x 10 Seated cervical rotation pillowcase x 10 Supine bridge x 10  LTR x 10 bil Supine KTOS and figure 4 stretch 3x15"  Manual Therapy: to decrease muscle spasm and pain and improve mobility  STM to R glutes and piriformis   1     PATIENT EDUCATION:  Education details: HEP update 11/09/22.  Person educated: Patient Education method: Explanation, Demonstration, Verbal cues, and Handouts Education comprehension: verbalized understanding and returned demonstration  HOME EXERCISE PROGRAM: Access Code: [email or web address] URL: [email or web address] Date: 11/09/2022 Prepared by: EliGlenetta Hewxercises - Seated Cervical Retraction  - 3 x daily - 7 x weekly - 1 sets - 10 reps - Seated Scapular Retraction  - 3 x daily - 7 x weekly - 1 sets - 10 reps - Seated Shoulder Rolls  - 3 x daily - 7 x weekly - 1 sets - 10 reps - Cervical Extension AROM with Strap  - 1 x daily - 7 x weekly - 1 sets - 10 reps - Seated Assisted Cervical Rotation with Towel  - 1 x daily - 7 x weekly - 1 sets - 10 reps - Standing Thoracic Open Book at [employer] 1 x daily - 7 x weekly - 1 sets - 10 reps - Supine Posterior Pelvic Tilt  - 1 x daily - 7 x weekly - 2 sets - 10 reps - 3 sec  hold - Supine Transversus Abdominis Bracing with Double Leg Fallout  - 1 x daily - 7 x weekly - 2 sets - 10 reps - Clamshell  - 1 x daily - 7 x weekly - 1 sets - 10 reps  ASSESSMENT:  CLINICAL IMPRESSION: Kyleena continues to report improvement in LBP d/t her using Biofreeze. We continued with progression of hip  strength with emphasizing stabilizing the spine. Cues required with the KTOS stretch for proper movement. Cues with the marches for iso hold to increase muscle activation. STM done post session for pain relief and to address muscle tension.   OBJECTIVE IMPAIRMENTS: decreased activity tolerance, decreased mobility, decreased ROM, decreased strength, dizziness, hypomobility, increased fascial restrictions, impaired perceived functional ability, increased muscle spasms, impaired flexibility, postural dysfunction, and pain.   ACTIVITY LIMITATIONS: carrying, lifting, bending, standing, sleeping, stairs, transfers, dressing, reach over head, and hygiene/grooming  PARTICIPATION LIMITATIONS: meal prep, cleaning, laundry, driving, shopping, and community activity  PERSONAL FACTORS: Age, Time since onset of injury/illness/exacerbation, and 3+ comorbidities: angina, hypothyroidism, neck surgery, neuropathy, R RTC repair, osteoporosis, CAD, history MI, BPPV, chronic low back pain  are also affecting patient's  functional outcome.   REHAB POTENTIAL: Good  CLINICAL DECISION MAKING: Evolving/moderate complexity  EVALUATION COMPLEXITY: Moderate   GOALS: Goals reviewed with patient? Yes  SHORT TERM GOALS: Target date: 11/20/2022   Patient will be independent with initial HEP.  Baseline: given Goal status: MET - 11/21/22   LONG TERM GOALS: Target date: 01/01/2023   Patient will be independent with advanced/ongoing HEP to improve outcomes and carryover.  Baseline:  Goal status: INITIAL  2.  Patient will report 75% improvement in neck pain to improve QOL.  Baseline: severe Goal status: IN PROGRESS  3.  Patient will demonstrate improved cervical ROM by 10 deg all directions for safety with driving.  Baseline: see objective Goal status: IN PROGRESS  4.  Patient will report 15% improvement on NDI to demonstrate improved functional ability.  Baseline: 46% Goal status: IN PROGRESS  5.  Patient will  report 75% improvement in low back pain to improve quality of life. Baseline:   Goal status: IN PROGRESS- 60-70% improvement 12/5  6. Patient will report 12% improvement on modified Oswestry to demonstrate improved functional ability. Baseline: TBA Goal status: IN PROGRESS   7. Patient will will be able to return to community-based exercise program to maintain progress. Baseline: stopped exercising due to pain Goal status: IN PROGRESS    PLAN:  PT FREQUENCY: 1-2x/week  PT DURATION: 8 weeks  PLANNED INTERVENTIONS: Therapeutic exercises, Therapeutic activity, Neuromuscular re-education, Balance training, Gait training, Patient/Family education, Self Care, Joint mobilization, Stair training, Dry Needling, Electrical stimulation, Spinal mobilization, Cryotherapy, Moist heat, Taping, Traction, Ultrasound, Manual therapy, and Re-evaluation  PLAN FOR NEXT SESSION: DN to glutes and piriformis; review and progress HEP including sciatic nerve stretches, manual therapy and modalities PRN.    Artist Pais, PTA 11/30/2022, 11:04 AM

## 2022-12-04 ENCOUNTER — Ambulatory Visit: Payer: Medicare Other | Admitting: Physical Therapy

## 2022-12-04 ENCOUNTER — Encounter: Payer: Self-pay | Admitting: Physical Therapy

## 2022-12-04 DIAGNOSIS — R252 Cramp and spasm: Secondary | ICD-10-CM | POA: Diagnosis not present

## 2022-12-04 DIAGNOSIS — G8929 Other chronic pain: Secondary | ICD-10-CM

## 2022-12-04 DIAGNOSIS — M542 Cervicalgia: Secondary | ICD-10-CM

## 2022-12-04 DIAGNOSIS — M5441 Lumbago with sciatica, right side: Secondary | ICD-10-CM | POA: Diagnosis not present

## 2022-12-04 DIAGNOSIS — R293 Abnormal posture: Secondary | ICD-10-CM

## 2022-12-04 NOTE — Therapy (Signed)
OUTPATIENT PHYSICAL THERAPY TREATMENT   Patient Name: [name] MRN: [id number] DOB:1946-07-26, 76 y.o., female Today's Date: 12/04/2022   PT End of Session - 12/04/22 1103     Visit Number 7    Number of Visits 12    Date for PT Re-Evaluation 12/18/22    Authorization Type [employer] Medicare    Progress Note Due on Visit 10    PT Start Time 1101    PT Stop Time 1154    PT Time Calculation (min) 53 min    Activity Tolerance Patient tolerated treatment well    Behavior During Therapy [facility] for tasks assessed/performed                 Past Medical History:  Diagnosis Date   Acute MI, subendocardial ([place])    Arthritis    Back pain 05/31/2013   Benign paroxysmal positional vertigo 08/05/2016   CAD (coronary artery disease)    a. NSTEMI 10/2011: [place] 11/19/11: pLAD 30%, oOM 60%, AVCFX 30%, CFX after OM2 30%, pRCA 60 and 70%, then 99%, AM 80-90% with TIMI 3 flow.  PCI: Promus DES x 2 to RCA; b. 06/2012 Cath: patent RCA stents w/ subtl occl of Acute Marginal (jailed)->Med rx; c. 05/2015 MV: EF 59%, mod mid infsept/inf/ap lat/ap infarct with peri-infarct isch-->Med Rx; d. 02/2016 Cath: LM nl, LAD 3m RI 50, RCA patent stents.   Colitis    Facial skin lesion 01/28/2017   GERD (gastroesophageal reflux disease)    occasional   Hyperlipidemia    Hypertension    Hypertensive heart disease    a. Echocardiogram 11/19/11: Difficult acoustic windows, EF 60-65%, normal LV wall thickness, grade 1 diastolic dysfunction   Hypoparathyroidism (HCC)    Low zinc level 11/26/2016   Multinodular thyroid    Goiter s/p thyroidectomy in 2007 with post-op  hypocalcemia and post-op hypothyroidism/hypoparathyroidism with hypocalcemia   Neck pain on right side 07/13/2013   Nocturia 05/31/2013   Osteoarthritis    Pain in right axilla 03/13/2016   Palpitations    a. 03/2012 - patient set up for event monitor but did not wear correctly -  she declined wearing a repeat monitor   Post-surgical hypothyroidism     Sun-damaged skin 12/05/2014   Tubular adenoma of colon 06/2011   Unspecified constipation 05/31/2013   Vertigo    Zinc deficiency 11/26/2015   Past Surgical History:  Procedure Laterality Date   CARDIAC CATHETERIZATION N/A 03/08/2016   Procedure: Left Heart Cath and Coronary Angiography;  Surgeon: JJettie Booze MD;  [facility];  Service: Cardiovascular;  Laterality: N/A;   COLONOSCOPY  Aenomatous polyps   07/05/2011   COLONOSCOPY N/A 09/28/2014   Procedure: COLONOSCOPY;  Surgeon: MLadene Artist MD;  Location: [facility];  Service: Endoscopy;  Laterality: N/A;   CORONARY ANGIOPLASTY WITH STENT PLACEMENT     LEFT HEART CATH AND CORONARY ANGIOGRAPHY N/A 12/09/2018   Procedure: LEFT HEART CATH AND CORONARY ANGIOGRAPHY;  Surgeon: VJettie Booze MD;  [place] [facility];  Service: Cardiovascular;  Laterality: N/A;   LEFT HEART CATHETERIZATION WITH CORONARY ANGIOGRAM N/A 07/17/2012   Procedure: LEFT HEART CATHETERIZATION WITH CORONARY ANGIOGRAM;  Surgeon: THillary Bow MD;  [facility];  Service: Cardiovascular;  Laterality: N/A;   PERCUTANEOUS CORONARY STENT INTERVENTION (PCI-S) N/A 11/19/2011   Procedure: PERCUTANEOUS CORONARY STENT INTERVENTION (PCI-S);  Surgeon: THillary Bow MD;  [facility];  Service: Cardiovascular;  Laterality: N/A;   POLYPECTOMY  SHOULDER ARTHROSCOPY W/ ROTATOR CUFF REPAIR     right   TOTAL THYROIDECTOMY  2007   GOITER   Patient Active Problem List   Diagnosis Date Noted   Paresthesia 08/28/2022   Chronic neck pain 08/28/2022   Neuropathy 08/21/2022   Vitamin deficiency 10/23/2021   Hypoglycemia 10/07/2019   Heart disease 01/29/2019   Sensorineural hearing loss (SNHL) of both ears 01/27/2019   Primary osteoarthritis of both knees 12/19/2018   DDD (degenerative disc disease), lumbar 12/19/2018   ANA positive 10/27/2018   Anxiety 02/17/2018   Peripheral arterial disease ([place]) 02/20/2017    Skin lesion of face 01/28/2017   Benign paroxysmal positional vertigo 08/05/2016   Antiplatelet or antithrombotic long-term use 03/27/2016   Right lumbar radiculopathy 02/21/2016   Abnormality of gait 02/21/2016   Zinc deficiency 11/26/2015   Right hip pain 10/26/2015   Medicare annual wellness visit, subsequent 03/13/2015   Preventative health care 03/13/2015   Sun-damaged skin 12/05/2014   Angina of effort 12/02/2014   Hx of colonic polyps 09/28/2014   Benign neoplasm of descending colon 09/28/2014   Other fatigue 09/10/2014   Personal history of colonic polyps 07/16/2014   Bilateral thumb pain 03/20/2014   Postsurgical hypoparathyroidism ([place]) 08/03/2013   Neck pain on right side 07/13/2013   Constipation 05/31/2013   Back pain 05/31/2013   Nocturia 05/31/2013   GERD (gastroesophageal reflux disease) 11/02/2012   Tension headache 11/02/2012   Anemia 06/11/2012   Hypokalemia 06/11/2012   Depression 01/21/2012   Headache(784.0) 12/23/2011   Bradycardia 12/07/2011   Hypocalcemia 12/07/2011   Acute MI, subendocardial ([place]) 11/20/2011   Hypothyroidism 05/20/2009   Essential hypertension, benign 05/20/2009   Hyperlipidemia 03/08/2009   CAD, NATIVE VESSEL 03/08/2009    PCP: Mosie Lukes, MD  REFERRING PROVIDER: Glenford Peers, NP  REFERRING DIAG: [phone] (ICD-10-CM) - Other spondylosis with radiculopathy, cervical region  THERAPY DIAG:  Cervicalgia  Chronic right-sided low back pain with right-sided sciatica  Abnormal posture  Cramp and spasm  Rationale for Evaluation and Treatment: Rehabilitation  ONSET DATE: chronic for years  SUBJECTIVE:                                                                                                                                                                                                         SUBJECTIVE STATEMENT: Pt reports her L shoulder has been hurting since Sunday.   Her back has also been hurting her a lot  since Sunday.  She reports all her pain started in August after a rough chinese massage that left  her bruisded.  PERTINENT HISTORY:  Surgical hypoparathyrodism, angina (has nitro), Hypothyroidism, hyperlipidemia, HTN, neuropathy hands and feet, R RTC repair  PAIN:  Are you having pain? Yes: NPRS scale: 6/10 Pain location: L shoulder/pecs Pain description: sometimes sharp shooting, sometimes arms feel weak, constant pain Aggravating factors: moving head overhead, reaching behind her back, turning neck (gets dizzy),  Relieving factors: holding arms close, avoiding movement.   Are you having pain? Yes: NPRS scale: 10/10 Pain location: low back, down R side to mid calf, sometimes numbness in feet with prolonged standing Pain description: pain and numbness Aggravating factors: bending over Relieving factors: laying down, icy/hot and patches, hot showers  PRECAUTIONS: None  WEIGHT BEARING RESTRICTIONS: No  FALLS:  Has patient fallen in last 6 months? No  LIVING ENVIRONMENT: Lives with: lives alone Lives in: House/apartment Stairs: No Has following equipment at home: None  OCCUPATION: retired  PLOF: Independent and Leisure: was going to [employer] but stopped due to pain  PATIENT GOALS: I want to be pain free without medications, know what machines are safe to use  NEXT MD VISIT: 02/02/23 with PCP  OBJECTIVE:   DIAGNOSTIC FINDINGS:  MRI done but not available to review  PATIENT SURVEYS:  NDI 23/50 = 46%  moderate disability   COGNITION: Overall cognitive status: Within functional limits for tasks assessed  SENSATION: Light touch: WFL  POSTURE: rounded shoulders and forward head  PALPATION: Tenderness throughout bil upper traps, levator scapulae, and cervical paraspinals, wincing and jumping away from firm touch.    CERVICAL ROM:   Active ROM AROM (deg) eval  Flexion 15  Extension 20  Right lateral flexion 10  Left lateral flexion 15  Right rotation 40  Left  rotation 40   (Blank rows = not tested)  UPPER EXTREMITY MMT:   MMT Right eval Left eval  Shoulder flexion 4+ 4+  Shoulder extension    Shoulder abduction 4+* 4  Shoulder adduction    Shoulder internal rotation 5 5  Shoulder external rotation 5 5  Elbow flexion 5 5  Elbow extension 5 5  Wrist flexion 5 5  Wrist extension 5 5   (Blank rows = not tested)  UPPER EXTREMITY ROM:  MMT Right eval Left eval  Shoulder flexion 115* 120  Shoulder extension 40 65  Shoulder abduction 100 135  Shoulder adduction    Shoulder internal rotation    Shoulder external rotation     (Blank rows = not tested)  LOWER EXTREMITY MMT:    MMT Right eval Left eval  Hip flexion 4 4  Hip extension    Hip abduction 5 5  Hip adduction 4+ 4+  Hip internal rotation    Hip external rotation    Knee flexion    Knee extension 5 5  Ankle dorsiflexion 5 5  Ankle plantarflexion     (Blank rows = not tested)   LUMBAR ROM:   Active  AROM  11/09/2022  Flexion To mid-thigh  Extension WNL, no pain, but increased pain after repeated 5x  Right lateral flexion Inc pain R side back, to knee  Left lateral flexion To mid thigh, increased pain R side back  Right rotation WNL  Left rotation WNL   (Blank rows = not tested)   CERVICAL SPECIAL TESTS:  Spurling's test: Positive right  FUNCTIONAL TESTS:  5 times sit to stand: 35 seconds Increased sciatic pain with R leg extension in sitting.   TODAY'S TREATMENT:  DATE:    12/04/2022 Therapeutic Exercise: to improve strength and mobility.  Demo, verbal and tactile cues throughout for technique. UBE forward x 4 min  Standing rows RTB x 10 Standing extension RTB x 10  Bil ER x 10 Supine LTR x 10  SKTC stretch 2 x 30 sec  Pelvic tilts x 10  Supine bridges x 10 - cramping in hamstring today Supine clamshell RTB 2 x 10  Manual  Therapy: to decrease muscle spasm and pain and improve mobility STM/TPR to L pectoralis, bil UT, cervical paraspinals, gentle cervical traction and NAGs into rotation, IASTM to lumbar paraspinals and glutes, MFR to bil QL, STM to lumbar erector spinae.  Increased pain with even grade 1 mobs to lumbar spine, UPA or PA.  Modalities: MHP x 8 min to neck and low back in prone to decrease pain.   11/30/2022 Therapeutic Exercise: to improve strength and mobility.  Demo, verbal and tactile cues throughout for technique. Recumbent Bike L2x42mn  Supine KTOS and figure 4 stretch 2x30 sec bil Bridge with RTB x 12  Clamshell RTB x 10 R/L S/L Supine LTR 15x both ways  Supine march x 10 BLE 3 sec hold RTB  Manual Therapy: STM to R glutes and piriformis with foam roll  11/27/2022 Therapeutic Exercise: to improve strength and mobility.  Demo, verbal and tactile cues throughout for technique. Recumbent Bike L2x612m SKTC stretch 2x30 sec  Supine KTOS stretch 2x30 sec bil Bridge with RTB x 10  Clamshell RTB x 10 R/L S/L Seated row blue TB x 10   Manual Therapy: STM to R glutes and piriformis with foam roll    PATIENT EDUCATION:  Education details: HEP update 11/09/22.  Person educated: Patient Education method: Explanation, Demonstration, Verbal cues, and Handouts Education comprehension: verbalized understanding and returned demonstration  HOME EXERCISE PROGRAM: Access Code: [email or web address] URL: [email or web address] Date: 11/09/2022 Prepared by: ElGlenetta HewExercises - Seated Cervical Retraction  - 3 x daily - 7 x weekly - 1 sets - 10 reps - Seated Scapular Retraction  - 3 x daily - 7 x weekly - 1 sets - 10 reps - Seated Shoulder Rolls  - 3 x daily - 7 x weekly - 1 sets - 10 reps - Cervical Extension AROM with Strap  - 1 x daily - 7 x weekly - 1 sets - 10 reps - Seated Assisted Cervical Rotation with Towel  - 1 x daily - 7 x weekly - 1 sets - 10 reps - Standing Thoracic Open  Book at [employer]- 1 x daily - 7 x weekly - 1 sets - 10 reps - Supine Posterior Pelvic Tilt  - 1 x daily - 7 x weekly - 2 sets - 10 reps - 3 sec  hold - Supine Transversus Abdominis Bracing with Double Leg Fallout  - 1 x daily - 7 x weekly - 2 sets - 10 reps - Clamshell  - 1 x daily - 7 x weekly - 1 sets - 10 reps  ASSESSMENT:  CLINICAL IMPRESSION: Reegan T Smeltz reports continued back and neck pain.  Today reviewed gentle strengthening exercises, and educated on exercise v. General activity at home.  Manual therapy at end of session followed by MHP decreased pain.  Maelle T Lyles continues to demonstrate potential for improvement and would benefit from continued skilled therapy to address impairments.      OBJECTIVE IMPAIRMENTS: decreased activity tolerance, decreased mobility, decreased ROM, decreased strength, dizziness, hypomobility, increased fascial restrictions, impaired  perceived functional ability, increased muscle spasms, impaired flexibility, postural dysfunction, and pain.   ACTIVITY LIMITATIONS: carrying, lifting, bending, standing, sleeping, stairs, transfers, dressing, reach over head, and hygiene/grooming  PARTICIPATION LIMITATIONS: meal prep, cleaning, laundry, driving, shopping, and community activity  PERSONAL FACTORS: Age, Time since onset of injury/illness/exacerbation, and 3+ comorbidities: angina, hypothyroidism, neck surgery, neuropathy, R RTC repair, osteoporosis, CAD, history MI, BPPV, chronic low back pain  are also affecting patient's functional outcome.   REHAB POTENTIAL: Good  CLINICAL DECISION MAKING: Evolving/moderate complexity  EVALUATION COMPLEXITY: Moderate   GOALS: Goals reviewed with patient? Yes  SHORT TERM GOALS: Target date: 11/20/2022   Patient will be independent with initial HEP.  Baseline: given Goal status: MET - 11/21/22   LONG TERM GOALS: Target date: 01/01/2023   Patient will be independent with advanced/ongoing HEP to improve  outcomes and carryover.  Baseline:  Goal status: IN PROGRESS  2.  Patient will report 75% improvement in neck pain to improve QOL.  Baseline: severe Goal status: IN PROGRESS  3.  Patient will demonstrate improved cervical ROM by 10 deg all directions for safety with driving.  Baseline: see objective Goal status: IN PROGRESS  4.  Patient will report 15% improvement on NDI to demonstrate improved functional ability.  Baseline: 46% Goal status: IN PROGRESS  5.  Patient will report 75% improvement in low back pain to improve quality of life. Baseline:   Goal status: IN PROGRESS- 60-70% improvement 12/5  6. Patient will report 12% improvement on modified Oswestry to demonstrate improved functional ability. Baseline: TBA Goal status: IN PROGRESS   7. Patient will will be able to return to community-based exercise program to maintain progress. Baseline: stopped exercising due to pain Goal status: IN PROGRESS    PLAN:  PT FREQUENCY: 1-2x/week  PT DURATION: 8 weeks  PLANNED INTERVENTIONS: Therapeutic exercises, Therapeutic activity, Neuromuscular re-education, Balance training, Gait training, Patient/Family education, Self Care, Joint mobilization, Stair training, Dry Needling, Electrical stimulation, Spinal mobilization, Cryotherapy, Moist heat, Taping, Traction, Ultrasound, Manual therapy, and Re-evaluation  PLAN FOR NEXT SESSION: continue gentle strengthing for core and posture, manual therapy, modalities PRN, will consider dry needling.    Rennie Natter, PT, DPT  12/04/2022, 12:00 PM

## 2022-12-07 ENCOUNTER — Ambulatory Visit: Payer: Medicare Other

## 2022-12-07 DIAGNOSIS — R293 Abnormal posture: Secondary | ICD-10-CM | POA: Diagnosis not present

## 2022-12-07 DIAGNOSIS — G8929 Other chronic pain: Secondary | ICD-10-CM | POA: Diagnosis not present

## 2022-12-07 DIAGNOSIS — M542 Cervicalgia: Secondary | ICD-10-CM | POA: Diagnosis not present

## 2022-12-07 DIAGNOSIS — R252 Cramp and spasm: Secondary | ICD-10-CM | POA: Diagnosis not present

## 2022-12-07 DIAGNOSIS — M5441 Lumbago with sciatica, right side: Secondary | ICD-10-CM | POA: Diagnosis not present

## 2022-12-07 NOTE — Therapy (Signed)
OUTPATIENT PHYSICAL THERAPY TREATMENT   Patient Name: MEYA CLUTTER MRN: [id number] DOB:09/28/1946, 76 y.o., female Today's Date: 12/07/2022   PT End of Session - 12/07/22 1101     Visit Number 8    Number of Visits 12    Date for PT Re-Evaluation 12/18/22    Authorization Type [employer]    Progress Note Due on Visit 10    PT Start Time 1018    PT Stop Time 1058    PT Time Calculation (min) 40 min    Activity Tolerance Patient tolerated treatment well    Behavior During Therapy [facility] for tasks assessed/performed                 Past Medical History:  Diagnosis Date   Acute MI, subendocardial ([place])    Arthritis    Back pain 05/31/2013   Benign paroxysmal positional vertigo 08/05/2016   CAD (coronary artery disease)    a. NSTEMI 10/2011: [place] 11/19/11: pLAD 30%, oOM 60%, AVCFX 30%, CFX after OM2 30%, pRCA 60 and 70%, then 99%, AM 80-90% with TIMI 3 flow.  PCI: Promus DES x 2 to RCA; b. 06/2012 Cath: patent RCA stents w/ subtl occl of Acute Marginal (jailed)->Med rx; c. 05/2015 MV: EF 59%, mod mid infsept/inf/ap lat/ap infarct with peri-infarct isch-->Med Rx; d. 02/2016 Cath: LM nl, LAD 74m RI 50, RCA patent stents.   Colitis    Facial skin lesion 01/28/2017   GERD (gastroesophageal reflux disease)    occasional   Hyperlipidemia    Hypertension    Hypertensive heart disease    a. Echocardiogram 11/19/11: Difficult acoustic windows, EF 60-65%, normal LV wall thickness, grade 1 diastolic dysfunction   Hypoparathyroidism (HCC)    Low zinc level 11/26/2016   Multinodular thyroid    Goiter s/p thyroidectomy in 2007 with post-op  hypocalcemia and post-op hypothyroidism/hypoparathyroidism with hypocalcemia   Neck pain on right side 07/13/2013   Nocturia 05/31/2013   Osteoarthritis    Pain in right axilla 03/13/2016   Palpitations    a. 03/2012 - patient set up for event monitor but did not wear correctly -  she declined wearing a repeat monitor   Post-surgical hypothyroidism     Sun-damaged skin 12/05/2014   Tubular adenoma of colon 06/2011   Unspecified constipation 05/31/2013   Vertigo    Zinc deficiency 11/26/2015   Past Surgical History:  Procedure Laterality Date   CARDIAC CATHETERIZATION N/A 03/08/2016   Procedure: Left Heart Cath and Coronary Angiography;  Surgeon: JJettie Booze MD;  [facility];  Service: Cardiovascular;  Laterality: N/A;   COLONOSCOPY  Aenomatous polyps   07/05/2011   COLONOSCOPY N/A 09/28/2014   Procedure: COLONOSCOPY;  Surgeon: MLadene Artist MD;  Location: [facility];  Service: Endoscopy;  Laterality: N/A;   CORONARY ANGIOPLASTY WITH STENT PLACEMENT     LEFT HEART CATH AND CORONARY ANGIOGRAPHY N/A 12/09/2018   Procedure: LEFT HEART CATH AND CORONARY ANGIOGRAPHY;  Surgeon: VJettie Booze MD;  [facility];  Service: Cardiovascular;  Laterality: N/A;   LEFT HEART CATHETERIZATION WITH CORONARY ANGIOGRAM N/A 07/17/2012   Procedure: LEFT HEART CATHETERIZATION WITH CORONARY ANGIOGRAM;  Surgeon: THillary Bow MD;  [facility];  Service: Cardiovascular;  Laterality: N/A;   PERCUTANEOUS CORONARY STENT INTERVENTION (PCI-S) N/A 11/19/2011   Procedure: PERCUTANEOUS CORONARY STENT INTERVENTION (PCI-S);  Surgeon: THillary Bow MD;  [facility];  Service: Cardiovascular;  Laterality: N/A;   POLYPECTOMY  SHOULDER ARTHROSCOPY W/ ROTATOR CUFF REPAIR     right   TOTAL THYROIDECTOMY  2007   GOITER   Patient Active Problem List   Diagnosis Date Noted   Paresthesia 08/28/2022   Chronic neck pain 08/28/2022   Neuropathy 08/21/2022   Vitamin deficiency 10/23/2021   Hypoglycemia 10/07/2019   Heart disease 01/29/2019   Sensorineural hearing loss (SNHL) of both ears 01/27/2019   Primary osteoarthritis of both knees 12/19/2018   DDD (degenerative disc disease), lumbar 12/19/2018   ANA positive 10/27/2018   Anxiety 02/17/2018   Peripheral arterial disease ([place]) 02/20/2017    Skin lesion of face 01/28/2017   Benign paroxysmal positional vertigo 08/05/2016   Antiplatelet or antithrombotic long-term use 03/27/2016   Right lumbar radiculopathy 02/21/2016   Abnormality of gait 02/21/2016   Zinc deficiency 11/26/2015   Right hip pain 10/26/2015   Medicare annual wellness visit, subsequent 03/13/2015   Preventative health care 03/13/2015   Sun-damaged skin 12/05/2014   Angina of effort 12/02/2014   Hx of colonic polyps 09/28/2014   Benign neoplasm of descending colon 09/28/2014   Other fatigue 09/10/2014   Personal history of colonic polyps 07/16/2014   Bilateral thumb pain 03/20/2014   Postsurgical hypoparathyroidism ([place]) 08/03/2013   Neck pain on right side 07/13/2013   Constipation 05/31/2013   Back pain 05/31/2013   Nocturia 05/31/2013   GERD (gastroesophageal reflux disease) 11/02/2012   Tension headache 11/02/2012   Anemia 06/11/2012   Hypokalemia 06/11/2012   Depression 01/21/2012   Headache(784.0) 12/23/2011   Bradycardia 12/07/2011   Hypocalcemia 12/07/2011   Acute MI, subendocardial ([place]) 11/20/2011   Hypothyroidism 05/20/2009   Essential hypertension, benign 05/20/2009   Hyperlipidemia 03/08/2009   CAD, NATIVE VESSEL 03/08/2009    PCP: Mosie Lukes, MD  REFERRING PROVIDER: Glenford Peers, NP  REFERRING DIAG: [phone] (ICD-10-CM) - Other spondylosis with radiculopathy, cervical region  THERAPY DIAG:  Cervicalgia  Chronic right-sided low back pain with right-sided sciatica  Abnormal posture  Cramp and spasm  Rationale for Evaluation and Treatment: Rehabilitation  ONSET DATE: chronic for years  SUBJECTIVE:                                                                                                                                                                                                         SUBJECTIVE STATEMENT: Pt notes that when she uses heat while exercising it helps her pain. She is feeling better  today.  PERTINENT HISTORY:  Surgical hypoparathyrodism, angina (has nitro), Hypothyroidism, hyperlipidemia, HTN, neuropathy hands and feet, R RTC repair  PAIN:  Are you having pain? Yes: NPRS scale: 6/10 Pain location: L shoulder/pecs Pain description: sometimes sharp shooting, sometimes arms feel weak, constant pain Aggravating factors: moving head overhead, reaching behind her back, turning neck (gets dizzy),  Relieving factors: holding arms close, avoiding movement.   Are you having pain? Yes: NPRS scale: 10/10 Pain location: low back, down R side to mid calf, sometimes numbness in feet with prolonged standing Pain description: pain and numbness Aggravating factors: bending over Relieving factors: laying down, icy/hot and patches, hot showers  PRECAUTIONS: None  WEIGHT BEARING RESTRICTIONS: No  FALLS:  Has patient fallen in last 6 months? No  LIVING ENVIRONMENT: Lives with: lives alone Lives in: House/apartment Stairs: No Has following equipment at home: None  OCCUPATION: retired  PLOF: Independent and Leisure: was going to [employer] but stopped due to pain  PATIENT GOALS: I want to be pain free without medications, know what machines are safe to use  NEXT MD VISIT: 02/02/23 with PCP  OBJECTIVE:   DIAGNOSTIC FINDINGS:  MRI done but not available to review  PATIENT SURVEYS:  NDI 23/50 = 46%  moderate disability   COGNITION: Overall cognitive status: Within functional limits for tasks assessed  SENSATION: Light touch: WFL  POSTURE: rounded shoulders and forward head  PALPATION: Tenderness throughout bil upper traps, levator scapulae, and cervical paraspinals, wincing and jumping away from firm touch.    CERVICAL ROM:   Active ROM AROM (deg) eval  Flexion 15  Extension 20  Right lateral flexion 10  Left lateral flexion 15  Right rotation 40  Left rotation 40   (Blank rows = not tested)  UPPER EXTREMITY MMT:   MMT Right eval Left eval  Shoulder  flexion 4+ 4+  Shoulder extension    Shoulder abduction 4+* 4  Shoulder adduction    Shoulder internal rotation 5 5  Shoulder external rotation 5 5  Elbow flexion 5 5  Elbow extension 5 5  Wrist flexion 5 5  Wrist extension 5 5   (Blank rows = not tested)  UPPER EXTREMITY ROM:  MMT Right eval Left eval  Shoulder flexion 115* 120  Shoulder extension 40 65  Shoulder abduction 100 135  Shoulder adduction    Shoulder internal rotation    Shoulder external rotation     (Blank rows = not tested)  LOWER EXTREMITY MMT:    MMT Right eval Left eval  Hip flexion 4 4  Hip extension    Hip abduction 5 5  Hip adduction 4+ 4+  Hip internal rotation    Hip external rotation    Knee flexion    Knee extension 5 5  Ankle dorsiflexion 5 5  Ankle plantarflexion     (Blank rows = not tested)   LUMBAR ROM:   Active  AROM  11/09/2022  Flexion To mid-thigh  Extension WNL, no pain, but increased pain after repeated 5x  Right lateral flexion Inc pain R side back, to knee  Left lateral flexion To mid thigh, increased pain R side back  Right rotation WNL  Left rotation WNL   (Blank rows = not tested)   CERVICAL SPECIAL TESTS:  Spurling's test: Positive right  FUNCTIONAL TESTS:  5 times sit to stand: 35 seconds Increased sciatic pain with R leg extension in sitting.   TODAY'S TREATMENT:  DATE:   12/07/22 Therapeutic Exercise: to improve strength and mobility.  Demo, verbal and tactile cues throughout for technique. Rec Bike L1x5mn Seated hip hinge x 10 Seated pallof press 2x10 RTB bil Farmer carries 2x90 ft unilateral 10lb TrA pedals in supine x 10 bil SLR x 10 3 sec hold bil Quadruped arm raises x 10  Quadruped hip extension x 10   12/04/2022 Therapeutic Exercise: to improve strength and mobility.  Demo, verbal and tactile cues throughout for  technique. UBE forward x 4 min  Standing rows RTB x 10 Standing extension RTB x 10  Bil ER x 10 Supine LTR x 10  SKTC stretch 2 x 30 sec  Pelvic tilts x 10  Supine bridges x 10 - cramping in hamstring today Supine clamshell RTB 2 x 10  Manual Therapy: to decrease muscle spasm and pain and improve mobility STM/TPR to L pectoralis, bil UT, cervical paraspinals, gentle cervical traction and NAGs into rotation, IASTM to lumbar paraspinals and glutes, MFR to bil QL, STM to lumbar erector spinae.  Increased pain with even grade 1 mobs to lumbar spine, UPA or PA.  Modalities: MHP x 8 min to neck and low back in prone to decrease pain.   11/30/2022 Therapeutic Exercise: to improve strength and mobility.  Demo, verbal and tactile cues throughout for technique. Recumbent Bike L2x664m  Supine KTOS and figure 4 stretch 2x30 sec bil Bridge with RTB x 12  Clamshell RTB x 10 R/L S/L Supine LTR 15x both ways  Supine march x 10 BLE 3 sec hold RTB  Manual Therapy: STM to R glutes and piriformis with foam roll     PATIENT EDUCATION:  Education details: HEP update 11/09/22.  Person educated: Patient Education method: Explanation, Demonstration, Verbal cues, and Handouts Education comprehension: verbalized understanding and returned demonstration  HOME EXERCISE PROGRAM: Access Code: [email or web address] URL: [email or web address] Date: 11/09/2022 Prepared by: ElGlenetta HewExercises - Seated Cervical Retraction  - 3 x daily - 7 x weekly - 1 sets - 10 reps - Seated Scapular Retraction  - 3 x daily - 7 x weekly - 1 sets - 10 reps - Seated Shoulder Rolls  - 3 x daily - 7 x weekly - 1 sets - 10 reps - Cervical Extension AROM with Strap  - 1 x daily - 7 x weekly - 1 sets - 10 reps - Seated Assisted Cervical Rotation with Towel  - 1 x daily - 7 x weekly - 1 sets - 10 reps - Standing Thoracic Open Book at [place]- 1 x daily - 7 x weekly - 1 sets - 10 reps - Supine Posterior Pelvic Tilt  - 1 x  daily - 7 x weekly - 2 sets - 10 reps - 3 sec  hold - Supine Transversus Abdominis Bracing with Double Leg Fallout  - 1 x daily - 7 x weekly - 2 sets - 10 reps - Clamshell  - 1 x daily - 7 x weekly - 1 sets - 10 reps  ASSESSMENT:  CLINICAL IMPRESSION: Wanna showed a good response to treatment. We progressed lumbar stabilization to strengthen her core to improve tolerance for functional activities. Updated HEP with more advanced core strengthening exercises with handouts and instruction given to patient. Pt noted no pain after interventions today.     OBJECTIVE IMPAIRMENTS: decreased activity tolerance, decreased mobility, decreased ROM, decreased strength, dizziness, hypomobility, increased fascial restrictions, impaired perceived functional ability, increased muscle spasms, impaired flexibility, postural dysfunction, and pain.  ACTIVITY LIMITATIONS: carrying, lifting, bending, standing, sleeping, stairs, transfers, dressing, reach over head, and hygiene/grooming  PARTICIPATION LIMITATIONS: meal prep, cleaning, laundry, driving, shopping, and community activity  PERSONAL FACTORS: Age, Time since onset of injury/illness/exacerbation, and 3+ comorbidities: angina, hypothyroidism, neck surgery, neuropathy, R RTC repair, osteoporosis, CAD, history MI, BPPV, chronic low back pain  are also affecting patient's functional outcome.   REHAB POTENTIAL: Good  CLINICAL DECISION MAKING: Evolving/moderate complexity  EVALUATION COMPLEXITY: Moderate   GOALS: Goals reviewed with patient? Yes  SHORT TERM GOALS: Target date: 11/20/2022   Patient will be independent with initial HEP.  Baseline: given Goal status: MET - 11/21/22   LONG TERM GOALS: Target date: 01/01/2023   Patient will be independent with advanced/ongoing HEP to improve outcomes and carryover.  Baseline:  Goal status: IN PROGRESS  2.  Patient will report 75% improvement in neck pain to improve QOL.  Baseline: severe Goal  status: IN PROGRESS  3.  Patient will demonstrate improved cervical ROM by 10 deg all directions for safety with driving.  Baseline: see objective Goal status: IN PROGRESS  4.  Patient will report 15% improvement on NDI to demonstrate improved functional ability.  Baseline: 46% Goal status: IN PROGRESS  5.  Patient will report 75% improvement in low back pain to improve quality of life. Baseline:   Goal status: IN PROGRESS- 60-70% improvement 12/5  6. Patient will report 12% improvement on modified Oswestry to demonstrate improved functional ability. Baseline: TBA Goal status: IN PROGRESS   7. Patient will will be able to return to community-based exercise program to maintain progress. Baseline: stopped exercising due to pain Goal status: IN PROGRESS    PLAN:  PT FREQUENCY: 1-2x/week  PT DURATION: 8 weeks  PLANNED INTERVENTIONS: Therapeutic exercises, Therapeutic activity, Neuromuscular re-education, Balance training, Gait training, Patient/Family education, Self Care, Joint mobilization, Stair training, Dry Needling, Electrical stimulation, Spinal mobilization, Cryotherapy, Moist heat, Taping, Traction, Ultrasound, Manual therapy, and Re-evaluation  PLAN FOR NEXT SESSION: continue gentle strengthing for core and posture, manual therapy, modalities PRN, will consider dry needling.    Artist Pais, PTA 12/07/2022, 11:02 AM

## 2022-12-11 ENCOUNTER — Ambulatory Visit: Payer: Medicare Other | Admitting: Physical Therapy

## 2022-12-11 ENCOUNTER — Encounter: Payer: Self-pay | Admitting: Physical Therapy

## 2022-12-11 DIAGNOSIS — R293 Abnormal posture: Secondary | ICD-10-CM | POA: Diagnosis not present

## 2022-12-11 DIAGNOSIS — R252 Cramp and spasm: Secondary | ICD-10-CM

## 2022-12-11 DIAGNOSIS — M542 Cervicalgia: Secondary | ICD-10-CM

## 2022-12-11 DIAGNOSIS — M5441 Lumbago with sciatica, right side: Secondary | ICD-10-CM | POA: Diagnosis not present

## 2022-12-11 DIAGNOSIS — G8929 Other chronic pain: Secondary | ICD-10-CM | POA: Diagnosis not present

## 2022-12-11 NOTE — Therapy (Signed)
OUTPATIENT PHYSICAL THERAPY TREATMENT   Patient Name: [name] MRN: [id number] DOB:06-04-1946, 76 y.o., female Today's Date: 12/11/2022   PT End of Session - 12/11/22 1005     Visit Number 9    Number of Visits 12    Date for PT Re-Evaluation 12/18/22    Authorization Type [employer] Medicare    Progress Note Due on Visit 10    PT Start Time 1006    PT Stop Time 1050    PT Time Calculation (min) 44 min    Activity Tolerance Patient tolerated treatment well    Behavior During Therapy [facility] for tasks assessed/performed                 Past Medical History:  Diagnosis Date   Acute MI, subendocardial ([place])    Arthritis    Back pain 05/31/2013   Benign paroxysmal positional vertigo 08/05/2016   CAD (coronary artery disease)    a. NSTEMI 10/2011: [place] 11/19/11: pLAD 30%, oOM 60%, AVCFX 30%, CFX after OM2 30%, pRCA 60 and 70%, then 99%, AM 80-90% with TIMI 3 flow.  PCI: Promus DES x 2 to RCA; b. 06/2012 Cath: patent RCA stents w/ subtl occl of Acute Marginal (jailed)->Med rx; c. 05/2015 MV: EF 59%, mod mid infsept/inf/ap lat/ap infarct with peri-infarct isch-->Med Rx; d. 02/2016 Cath: LM nl, LAD 58m RI 50, RCA patent stents.   Colitis    Facial skin lesion 01/28/2017   GERD (gastroesophageal reflux disease)    occasional   Hyperlipidemia    Hypertension    Hypertensive heart disease    a. Echocardiogram 11/19/11: Difficult acoustic windows, EF 60-65%, normal LV wall thickness, grade 1 diastolic dysfunction   Hypoparathyroidism (HCC)    Low zinc level 11/26/2016   Multinodular thyroid    Goiter s/p thyroidectomy in 2007 with post-op  hypocalcemia and post-op hypothyroidism/hypoparathyroidism with hypocalcemia   Neck pain on right side 07/13/2013   Nocturia 05/31/2013   Osteoarthritis    Pain in right axilla 03/13/2016   Palpitations    a. 03/2012 - patient set up for event monitor but did not wear correctly -  she declined wearing a repeat monitor   Post-surgical hypothyroidism     Sun-damaged skin 12/05/2014   Tubular adenoma of colon 06/2011   Unspecified constipation 05/31/2013   Vertigo    Zinc deficiency 11/26/2015   Past Surgical History:  Procedure Laterality Date   CARDIAC CATHETERIZATION N/A 03/08/2016   Procedure: Left Heart Cath and Coronary Angiography;  Surgeon: JJettie Booze MD;  [facility];  Service: Cardiovascular;  Laterality: N/A;   COLONOSCOPY  Aenomatous polyps   07/05/2011   COLONOSCOPY N/A 09/28/2014   Procedure: COLONOSCOPY;  Surgeon: MLadene Artist MD;  Location: [facility];  Service: Endoscopy;  Laterality: N/A;   CORONARY ANGIOPLASTY WITH STENT PLACEMENT     LEFT HEART CATH AND CORONARY ANGIOGRAPHY N/A 12/09/2018   Procedure: LEFT HEART CATH AND CORONARY ANGIOGRAPHY;  Surgeon: VJettie Booze MD;  [facility];  Service: Cardiovascular;  Laterality: N/A;   LEFT HEART CATHETERIZATION WITH CORONARY ANGIOGRAM N/A 07/17/2012   Procedure: LEFT HEART CATHETERIZATION WITH CORONARY ANGIOGRAM;  Surgeon: THillary Bow MD;  [facility];  Service: Cardiovascular;  Laterality: N/A;   PERCUTANEOUS CORONARY STENT INTERVENTION (PCI-S) N/A 11/19/2011   Procedure: PERCUTANEOUS CORONARY STENT INTERVENTION (PCI-S);  Surgeon: THillary Bow MD;  [facility];  Service: Cardiovascular;  Laterality: N/A;   POLYPECTOMY  SHOULDER ARTHROSCOPY W/ ROTATOR CUFF REPAIR     right   TOTAL THYROIDECTOMY  2007   GOITER   Patient Active Problem List   Diagnosis Date Noted   Paresthesia 08/28/2022   Chronic neck pain 08/28/2022   Neuropathy 08/21/2022   Vitamin deficiency 10/23/2021   Hypoglycemia 10/07/2019   Heart disease 01/29/2019   Sensorineural hearing loss (SNHL) of both ears 01/27/2019   Primary osteoarthritis of both knees 12/19/2018   DDD (degenerative disc disease), lumbar 12/19/2018   ANA positive 10/27/2018   Anxiety 02/17/2018   Peripheral arterial disease ([place]) 02/20/2017    Skin lesion of face 01/28/2017   Benign paroxysmal positional vertigo 08/05/2016   Antiplatelet or antithrombotic long-term use 03/27/2016   Right lumbar radiculopathy 02/21/2016   Abnormality of gait 02/21/2016   Zinc deficiency 11/26/2015   Right hip pain 10/26/2015   Medicare annual wellness visit, subsequent 03/13/2015   Preventative health care 03/13/2015   Sun-damaged skin 12/05/2014   Angina of effort 12/02/2014   Hx of colonic polyps 09/28/2014   Benign neoplasm of descending colon 09/28/2014   Other fatigue 09/10/2014   Personal history of colonic polyps 07/16/2014   Bilateral thumb pain 03/20/2014   Postsurgical hypoparathyroidism ([place]) 08/03/2013   Neck pain on right side 07/13/2013   Constipation 05/31/2013   Back pain 05/31/2013   Nocturia 05/31/2013   GERD (gastroesophageal reflux disease) 11/02/2012   Tension headache 11/02/2012   Anemia 06/11/2012   Hypokalemia 06/11/2012   Depression 01/21/2012   Headache(784.0) 12/23/2011   Bradycardia 12/07/2011   Hypocalcemia 12/07/2011   Acute MI, subendocardial ([place]) 11/20/2011   Hypothyroidism 05/20/2009   Essential hypertension, benign 05/20/2009   Hyperlipidemia 03/08/2009   CAD, NATIVE VESSEL 03/08/2009    PCP: Mosie Lukes, MD  REFERRING PROVIDER: Glenford Peers, NP  REFERRING DIAG: [phone] (ICD-10-CM) - Other spondylosis with radiculopathy, cervical region  THERAPY DIAG:  Cervicalgia  Chronic right-sided low back pain with right-sided sciatica  Abnormal posture  Cramp and spasm  Rationale for Evaluation and Treatment: Rehabilitation  ONSET DATE: chronic for years  SUBJECTIVE:                                                                                                                                                                                                         SUBJECTIVE STATEMENT: Patient reports she has a lot of tension and stiffness in there neck.   PERTINENT HISTORY:   Surgical hypoparathyrodism, angina (has nitro), Hypothyroidism, hyperlipidemia, HTN, neuropathy hands and feet, R RTC repair  PAIN:  Are you having  pain? Yes: NPRS scale: 0/10 Pain location: L shoulder/pecs Pain description: sometimes sharp shooting, sometimes arms feel weak, constant pain Aggravating factors: moving head overhead, reaching behind her back, turning neck (gets dizzy),  Relieving factors: holding arms close, avoiding movement.   Are you having pain? Yes: NPRS scale: 010 today, pain with bending and sleeping on R side Pain location: low back, down R side to mid calf, sometimes numbness in feet with prolonged standing Pain description: pain and numbness Aggravating factors: bending over Relieving factors: laying down, icy/hot and patches, hot showers  PRECAUTIONS: None  WEIGHT BEARING RESTRICTIONS: No  FALLS:  Has patient fallen in last 6 months? No  LIVING ENVIRONMENT: Lives with: lives alone Lives in: House/apartment Stairs: No Has following equipment at home: None  OCCUPATION: retired  PLOF: Independent and Leisure: was going to [employer] but stopped due to pain  PATIENT GOALS: I want to be pain free without medications, know what machines are safe to use  NEXT MD VISIT: 02/02/23 with PCP  OBJECTIVE:   DIAGNOSTIC FINDINGS:  MRI done but not available to review  PATIENT SURVEYS:  NDI 23/50 = 46%  moderate disability   COGNITION: Overall cognitive status: Within functional limits for tasks assessed  SENSATION: Light touch: WFL  POSTURE: rounded shoulders and forward head  PALPATION: Tenderness throughout bil upper traps, levator scapulae, and cervical paraspinals, wincing and jumping away from firm touch.    CERVICAL ROM:   Active ROM AROM (deg) eval  Flexion 15  Extension 20  Right lateral flexion 10  Left lateral flexion 15  Right rotation 40  Left rotation 40   (Blank rows = not tested)  UPPER EXTREMITY MMT:   MMT Right eval  Left eval  Shoulder flexion 4+ 4+  Shoulder extension    Shoulder abduction 4+* 4  Shoulder adduction    Shoulder internal rotation 5 5  Shoulder external rotation 5 5  Elbow flexion 5 5  Elbow extension 5 5  Wrist flexion 5 5  Wrist extension 5 5   (Blank rows = not tested)  UPPER EXTREMITY ROM:  MMT Right eval Left eval  Shoulder flexion 115* 120  Shoulder extension 40 65  Shoulder abduction 100 135  Shoulder adduction    Shoulder internal rotation    Shoulder external rotation     (Blank rows = not tested)  LOWER EXTREMITY MMT:    MMT Right eval Left eval  Hip flexion 4 4  Hip extension    Hip abduction 5 5  Hip adduction 4+ 4+  Hip internal rotation    Hip external rotation    Knee flexion    Knee extension 5 5  Ankle dorsiflexion 5 5  Ankle plantarflexion     (Blank rows = not tested)   LUMBAR ROM:   Active  AROM  11/09/2022  Flexion To mid-thigh  Extension WNL, no pain, but increased pain after repeated 5x  Right lateral flexion Inc pain R side back, to knee  Left lateral flexion To mid thigh, increased pain R side back  Right rotation WNL  Left rotation WNL   (Blank rows = not tested)   CERVICAL SPECIAL TESTS:  Spurling's test: Positive right  FUNCTIONAL TESTS:  5 times sit to stand: 35 seconds Increased sciatic pain with R leg extension in sitting.   TODAY'S TREATMENT:  DATE:   12/07/22 Therapeutic Exercise: to improve strength and mobility.  Demo, verbal and tactile cues throughout for technique. Rec Bike L2x65mn Seated hip hinge x 5, then 5 with 8# Standing pallof rotation RTB x 10 ea way Standing pallof press 1x15 BTB bil Farmer carries 2x90 ft unilateral 10lb TrA pedals in supine x 10 bil SLR x 10 slow pace Quadruped hip extension x 7 Seated server stretch for levator post manual (palms up, pinkies and  elbows together and push hands forward. 2 x15 sec  Manual Therapy: to decrease muscle spasm and pain and improve mobility STM to bil UT, LS [facility]22 Therapeutic Exercise: to improve strength and mobility.  Demo, verbal and tactile cues throughout for technique. Rec Bike L1x765m Seated hip hinge x 10 Seated pallof press 2x10 RTB bil Farmer carries 1x90 ft unilateral 10lb, then 1x90 ft each with 10# off of side and 8# OH in contralateral UE TrA pedals in supine x 10 bil SLR x 10 3 sec hold bil Quadruped arm raises x 10  Quadruped hip extension x 10   12/04/2022 Therapeutic Exercise: to improve strength and mobility.  Demo, verbal and tactile cues throughout for technique. UBE forward x 4 min  Standing rows RTB x 10 Standing extension RTB x 10  Bil ER x 10 Supine LTR x 10  SKTC stretch 2 x 30 sec  Pelvic tilts x 10  Supine bridges x 10 - cramping in hamstring today Supine clamshell RTB 2 x 10  Manual Therapy: to decrease muscle spasm and pain and improve mobility STM/TPR to L pectoralis, bil UT, cervical paraspinals, gentle cervical traction and NAGs into rotation, IASTM to lumbar paraspinals and glutes, MFR to bil QL, STM to lumbar erector spinae.  Increased pain with even grade 1 mobs to lumbar spine, UPA or PA.  Modalities: MHP x 8 min to neck and low back in prone to decrease pain.   11/30/2022 Therapeutic Exercise: to improve strength and mobility.  Demo, verbal and tactile cues throughout for technique. Recumbent Bike L2x6m73m Supine KTOS and figure 4 stretch 2x30 sec bil Bridge with RTB x 12  Clamshell RTB x 10 R/L S/L Supine LTR 15x both ways  Supine march x 10 BLE 3 sec hold RTB  Manual Therapy: STM to R glutes and piriformis with foam roll     PATIENT EDUCATION:  Education details: HEP update 11/09/22.  Person educated: Patient Education method: Explanation, Demonstration, Verbal cues, and Handouts Education comprehension: verbalized  understanding and returned demonstration  HOME EXERCISE PROGRAM: Access Code: [email or web address] URL: [email or web address] Date: 11/09/2022 Prepared by: EliGlenetta Hewxercises - Seated Cervical Retraction  - 3 x daily - 7 x weekly - 1 sets - 10 reps - Seated Scapular Retraction  - 3 x daily - 7 x weekly - 1 sets - 10 reps - Seated Shoulder Rolls  - 3 x daily - 7 x weekly - 1 sets - 10 reps - Cervical Extension AROM with Strap  - 1 x daily - 7 x weekly - 1 sets - 10 reps - Seated Assisted Cervical Rotation with Towel  - 1 x daily - 7 x weekly - 1 sets - 10 reps - Standing Thoracic Open Book at [employer] daily - 7 x weekly - 1 sets - 10 reps - Supine Posterior Pelvic Tilt  - 1 x daily - 7 x weekly - 2 sets - 10 reps - 3 sec  hold - Supine Transversus Abdominis Bracing with Double Leg Fallout  - 1 x daily - 7 x weekly - 2 sets - 10 reps - Clamshell  - 1 x daily - 7 x weekly - 1 sets - 10 reps  ASSESSMENT:  CLINICAL IMPRESSION: Lujean was able to progress some exercises with heavier weight today without complaint, but challenging. Fatigues fairly easily with quadriped alt leg press. Needs 10th visit progress note next visit.    OBJECTIVE IMPAIRMENTS: decreased activity tolerance, decreased mobility, decreased ROM, decreased strength, dizziness, hypomobility, increased fascial restrictions, impaired perceived functional ability, increased muscle spasms, impaired flexibility, postural dysfunction, and pain.   ACTIVITY LIMITATIONS: carrying, lifting, bending, standing, sleeping, stairs, transfers, dressing, reach over head, and hygiene/grooming  PARTICIPATION LIMITATIONS: meal prep, cleaning, laundry, driving, shopping, and community activity  PERSONAL FACTORS: Age, Time since onset of injury/illness/exacerbation, and 3+ comorbidities: angina, hypothyroidism, neck surgery, neuropathy, R RTC repair, osteoporosis, CAD, history MI, BPPV, chronic low back pain  are also affecting  patient's functional outcome.   REHAB POTENTIAL: Good  CLINICAL DECISION MAKING: Evolving/moderate complexity  EVALUATION COMPLEXITY: Moderate   GOALS: Goals reviewed with patient? Yes  SHORT TERM GOALS: Target date: 11/20/2022   Patient will be independent with initial HEP.  Baseline: given Goal status: MET - 11/21/22   LONG TERM GOALS: Target date: 01/01/2023   Patient will be independent with advanced/ongoing HEP to improve outcomes and carryover.  Baseline:  Goal status: IN PROGRESS  2.  Patient will report 75% improvement in neck pain to improve QOL.  Baseline: severe Goal status: IN PROGRESS  3.  Patient will demonstrate improved cervical ROM by 10 deg all directions for safety with driving.  Baseline: see objective Goal status: IN PROGRESS  4.  Patient will report 15% improvement on NDI to demonstrate improved functional ability.  Baseline: 46% Goal status: IN PROGRESS  5.  Patient will report 75% improvement in low back pain to improve quality of life. Baseline:   Goal status: IN PROGRESS- 60-70% improvement 12/5  6. Patient will report 12% improvement on modified Oswestry to demonstrate improved functional ability. Baseline: TBA Goal status: IN PROGRESS   7. Patient will will be able to return to community-based exercise program to maintain progress. Baseline: stopped exercising due to pain Goal status: IN PROGRESS    PLAN:  PT FREQUENCY: 1-2x/week  PT DURATION: 8 weeks  PLANNED INTERVENTIONS: Therapeutic exercises, Therapeutic activity, Neuromuscular re-education, Balance training, Gait training, Patient/Family education, Self Care, Joint mobilization, Stair training, Dry Needling, Electrical stimulation, Spinal mobilization, Cryotherapy, Moist heat, Taping, Traction, Ultrasound, Manual therapy, and Re-evaluation  PLAN FOR NEXT SESSION: Progress Note; continue gentle strengthing for core and posture, manual therapy, modalities PRN, will consider dry  needling.    Yonael Tulloch, PT 12/11/2022, 10:56 AM

## 2022-12-13 ENCOUNTER — Ambulatory Visit: Payer: Medicare Other | Admitting: Podiatry

## 2022-12-19 ENCOUNTER — Ambulatory Visit: Payer: Medicare Other

## 2022-12-19 DIAGNOSIS — G8929 Other chronic pain: Secondary | ICD-10-CM

## 2022-12-19 DIAGNOSIS — R293 Abnormal posture: Secondary | ICD-10-CM

## 2022-12-19 DIAGNOSIS — M5441 Lumbago with sciatica, right side: Secondary | ICD-10-CM | POA: Diagnosis not present

## 2022-12-19 DIAGNOSIS — M542 Cervicalgia: Secondary | ICD-10-CM | POA: Diagnosis not present

## 2022-12-19 DIAGNOSIS — R252 Cramp and spasm: Secondary | ICD-10-CM | POA: Diagnosis not present

## 2022-12-19 NOTE — Therapy (Addendum)
OUTPATIENT PHYSICAL THERAPY TREATMENT Progress Note Reporting Period 11/06/22 to 12/19/22  See note below for Objective Data and Assessment of Progress/Goals.   I have reviewed progress and current plan of care is appropriate.  Rennie Natter, PT, DPT 2:10 PM  12/26/2022    Patient Name: [name] MRN: [id number] DOB:[birth year], 76 y.o., female Today's Date: 12/19/2022   PT End of Session - 12/19/22 1700     Visit Number 10    Number of Visits 12    Date for PT Re-Evaluation 12/18/22    Authorization Type [employer] Medicare    Progress Note Due on Visit 10    PT Start Time 1615    PT Stop Time 1658    PT Time Calculation (min) 43 min    Activity Tolerance Patient tolerated treatment well    Behavior During Therapy [facility] for tasks assessed/performed                  Past Medical History:  Diagnosis Date   Acute MI, subendocardial ([place])    Arthritis    Back pain 05/31/2013   Benign paroxysmal positional vertigo 08/05/2016   CAD (coronary artery disease)    a. NSTEMI 10/2011: [place] 11/19/11: pLAD 30%, oOM 60%, AVCFX 30%, CFX after OM2 30%, pRCA 60 and 70%, then 99%, AM 80-90% with TIMI 3 flow.  PCI: Promus DES x 2 to RCA; b. 06/2012 Cath: patent RCA stents w/ subtl occl of Acute Marginal (jailed)->Med rx; c. 05/2015 MV: EF 59%, mod mid infsept/inf/ap lat/ap infarct with peri-infarct isch-->Med Rx; d. 02/2016 Cath: LM nl, LAD 38m RI 50, RCA patent stents.   Colitis    Facial skin lesion 01/28/2017   GERD (gastroesophageal reflux disease)    occasional   Hyperlipidemia    Hypertension    Hypertensive heart disease    a. Echocardiogram 11/19/11: Difficult acoustic windows, EF 60-65%, normal LV wall thickness, grade 1 diastolic dysfunction   Hypoparathyroidism (HCC)    Low zinc level 11/26/2016   Multinodular thyroid    Goiter s/p thyroidectomy in 2007 with post-op  hypocalcemia and post-op hypothyroidism/hypoparathyroidism with hypocalcemia   Neck pain on right side  07/13/2013   Nocturia 05/31/2013   Osteoarthritis    Pain in right axilla 03/13/2016   Palpitations    a. 03/2012 - patient set up for event monitor but did not wear correctly -  she declined wearing a repeat monitor   Post-surgical hypothyroidism    Sun-damaged skin 12/05/2014   Tubular adenoma of colon 06/2011   Unspecified constipation 05/31/2013   Vertigo    Zinc deficiency 11/26/2015   Past Surgical History:  Procedure Laterality Date   CARDIAC CATHETERIZATION N/A 03/08/2016   Procedure: Left Heart Cath and Coronary Angiography;  Surgeon: JJettie Booze MD;  [facility];  Service: Cardiovascular;  Laterality: N/A;   COLONOSCOPY  Aenomatous polyps   07/05/2011   COLONOSCOPY N/A 09/28/2014   Procedure: COLONOSCOPY;  Surgeon: MLadene Artist MD;  Location: [facility];  Service: Endoscopy;  Laterality: N/A;   CORONARY ANGIOPLASTY WITH STENT PLACEMENT     LEFT HEART CATH AND CORONARY ANGIOGRAPHY N/A 12/09/2018   Procedure: LEFT HEART CATH AND CORONARY ANGIOGRAPHY;  Surgeon: VJettie Booze MD;  [facility];  Service: Cardiovascular;  Laterality: N/A;   LEFT HEART CATHETERIZATION WITH CORONARY ANGIOGRAM N/A 07/17/2012   Procedure: LEFT HEART CATHETERIZATION WITH CORONARY ANGIOGRAM;  Surgeon: THillary Bow MD;  [facility];  Service: Cardiovascular;  Laterality: N/A;   PERCUTANEOUS CORONARY STENT INTERVENTION (PCI-S) N/A 11/19/2011   Procedure: PERCUTANEOUS CORONARY STENT INTERVENTION (PCI-S);  Surgeon: Hillary Bow, MD;  Location: [facility] CATH LAB;  Service: Cardiovascular;  Laterality: N/A;   POLYPECTOMY     SHOULDER ARTHROSCOPY W/ ROTATOR CUFF REPAIR     right   TOTAL THYROIDECTOMY  2007   GOITER   Patient Active Problem List   Diagnosis Date Noted   Paresthesia 08/28/2022   Chronic neck pain 08/28/2022   Neuropathy 08/21/2022   Vitamin deficiency 10/23/2021   Hypoglycemia 10/07/2019   Heart disease 01/29/2019    Sensorineural hearing loss (SNHL) of both ears 01/27/2019   Primary osteoarthritis of both knees 12/19/2018   DDD (degenerative disc disease), lumbar 12/19/2018   ANA positive 10/27/2018   Anxiety 02/17/2018   Peripheral arterial disease ([place]) 02/20/2017   Skin lesion of face 01/28/2017   Benign paroxysmal positional vertigo 08/05/2016   Antiplatelet or antithrombotic long-term use 03/27/2016   Right lumbar radiculopathy 02/21/2016   Abnormality of gait 02/21/2016   Zinc deficiency 11/26/2015   Right hip pain 10/26/2015   Medicare annual wellness visit, subsequent 03/13/2015   Preventative health care 03/13/2015   Sun-damaged skin 12/05/2014   Angina of effort 12/02/2014   Hx of colonic polyps 09/28/2014   Benign neoplasm of descending colon 09/28/2014   Other fatigue 09/10/2014   Personal history of colonic polyps 07/16/2014   Bilateral thumb pain 03/20/2014   Postsurgical hypoparathyroidism ([place]) 08/03/2013   Neck pain on right side 07/13/2013   Constipation 05/31/2013   Back pain 05/31/2013   Nocturia 05/31/2013   GERD (gastroesophageal reflux disease) 11/02/2012   Tension headache 11/02/2012   Anemia 06/11/2012   Hypokalemia 06/11/2012   Depression 01/21/2012   Headache(784.0) 12/23/2011   Bradycardia 12/07/2011   Hypocalcemia 12/07/2011   Acute MI, subendocardial ([place]) 11/20/2011   Hypothyroidism 05/20/2009   Essential hypertension, benign 05/20/2009   Hyperlipidemia 03/08/2009   CAD, NATIVE VESSEL 03/08/2009    PCP: Mosie Lukes, MD  REFERRING PROVIDER: Glenford Peers, NP  REFERRING DIAG: [phone] (ICD-10-CM) - Other spondylosis with radiculopathy, cervical region  THERAPY DIAG:  Cervicalgia  Chronic right-sided low back pain with right-sided sciatica  Abnormal posture  Cramp and spasm  Rationale for Evaluation and Treatment: Rehabilitation  ONSET DATE: chronic for years  SUBJECTIVE:  SUBJECTIVE STATEMENT: Patient reports she has a lot of tension and stiffness in there neck.   PERTINENT HISTORY:  Surgical hypoparathyrodism, angina (has nitro), Hypothyroidism, hyperlipidemia, HTN, neuropathy hands and feet, R RTC repair  PAIN:  Are you having pain? Yes: NPRS scale: 0/10 Pain location: L shoulder/pecs Pain description: sometimes sharp shooting, sometimes arms feel weak, constant pain Aggravating factors: moving head overhead, reaching behind her back, turning neck (gets dizzy),  Relieving factors: holding arms close, avoiding movement.   Are you having pain? Yes: NPRS scale: 010 today, pain with bending and sleeping on R side Pain location: low back, down R side to mid calf, sometimes numbness in feet with prolonged standing Pain description: pain and numbness Aggravating factors: bending over Relieving factors: laying down, icy/hot and patches, hot showers  PRECAUTIONS: None  WEIGHT BEARING RESTRICTIONS: No  FALLS:  Has patient fallen in last 6 months? No  LIVING ENVIRONMENT: Lives with: lives alone Lives in: House/apartment Stairs: No Has following equipment at home: None  OCCUPATION: retired  PLOF: Independent and Leisure: was going to [employer] but stopped due to pain  PATIENT GOALS: I want to be pain free without medications, know what machines are safe to use  NEXT MD VISIT: 02/02/23 with PCP  OBJECTIVE:   DIAGNOSTIC FINDINGS:  MRI done but not available to review  PATIENT SURVEYS:  NDI 23/50 = 46%  moderate disability   COGNITION: Overall cognitive status: Within functional limits for tasks assessed  SENSATION: Light touch: WFL  POSTURE: rounded shoulders and forward head  PALPATION: Tenderness throughout bil upper traps, levator scapulae, and cervical paraspinals, wincing and jumping away from firm touch.     CERVICAL ROM:   Active ROM AROM (deg) eval AROM 12/19/22  Flexion 15 22  Extension 20 25  Right lateral flexion 10 29  Left lateral flexion 15 32  Right rotation 40 50  Left rotation 40 50   (Blank rows = not tested)  UPPER EXTREMITY MMT:   MMT Right eval Left eval  Shoulder flexion 4+ 4+  Shoulder extension    Shoulder abduction 4+* 4  Shoulder adduction    Shoulder internal rotation 5 5  Shoulder external rotation 5 5  Elbow flexion 5 5  Elbow extension 5 5  Wrist flexion 5 5  Wrist extension 5 5   (Blank rows = not tested)  UPPER EXTREMITY ROM:  MMT Right eval Left eval  Shoulder flexion 115* 120  Shoulder extension 40 65  Shoulder abduction 100 135  Shoulder adduction    Shoulder internal rotation    Shoulder external rotation     (Blank rows = not tested)  LOWER EXTREMITY MMT:    MMT Right eval Left eval  Hip flexion 4 4  Hip extension    Hip abduction 5 5  Hip adduction 4+ 4+  Hip internal rotation    Hip external rotation    Knee flexion    Knee extension 5 5  Ankle dorsiflexion 5 5  Ankle plantarflexion     (Blank rows = not tested)   LUMBAR ROM:   Active  AROM  11/09/2022  Flexion To mid-thigh  Extension WNL, no pain, but increased pain after repeated 5x  Right lateral flexion Inc pain R side back, to knee  Left lateral flexion To mid thigh, increased pain R side back  Right rotation WNL  Left rotation WNL   (Blank rows = not tested)   CERVICAL SPECIAL TESTS:  Spurling's test: Positive right  FUNCTIONAL TESTS:  5 times sit to stand: 35 seconds Increased sciatic pain with R leg extension in sitting.   TODAY'S TREATMENT:                                                                                                                              DATE:   12/19/22 Therapeutic Exercise: to improve strength and mobility.  Demo, verbal and tactile cues throughout for technique. Bike L2x69mn [place] 10lb 2x in eahc  hand Standing hip hinge x 10 with cane to keep back straight Seated row 20# 2x15 Seated lat pulls 20# 2x15  12/07/22 Therapeutic Exercise: to improve strength and mobility.  Demo, verbal and tactile cues throughout for technique. Rec Bike L2x579m Seated hip hinge x 5, then 5 with 8# Standing pallof rotation RTB x 10 ea way Standing pallof press 1x15 BTB bil Farmer carries 2x90 ft unilateral 10lb TrA pedals in supine x 10 bil SLR x 10 slow pace Quadruped hip extension x 7 Seated server stretch for levator post manual (palms up, pinkies and elbows together and push hands forward. 2 x15 sec  Manual Therapy: to decrease muscle spasm and pain and improve mobility STM to bil UT, LS TPR to [facility] Therapeutic Exercise: to improve strength and mobility.  Demo, verbal and tactile cues throughout for technique. Rec Bike [id number] hip hinge x 10 Seated pallof press 2x10 RTB bil Farmer carries 1x90 ft unilateral 10lb, then 1x90 ft each with 10# off of side and 8# OH in contralateral UE TrA pedals in supine x 10 bil SLR x 10 3 sec hold bil Quadruped arm raises x 10  Quadruped hip extension x 10   12/04/2022 Therapeutic Exercise: to improve strength and mobility.  Demo, verbal and tactile cues throughout for technique. UBE forward x 4 min  Standing rows RTB x 10 Standing extension RTB x 10  Bil ER x 10 Supine LTR x 10  SKTC stretch 2 x 30 sec  Pelvic tilts x 10  Supine bridges x 10 - cramping in hamstring today Supine clamshell RTB 2 x 10  Manual Therapy: to decrease muscle spasm and pain and improve mobility STM/TPR to L pectoralis, bil UT, cervical paraspinals, gentle cervical traction and NAGs into rotation, IASTM to lumbar paraspinals and glutes, MFR to bil QL, STM to lumbar erector spinae.  Increased pain with even grade 1 mobs to lumbar spine, UPA or PA.  Modalities: MHP x 8 min to neck and low back in prone to decrease pain.   11/30/2022 Therapeutic Exercise:  to improve strength and mobility.  Demo, verbal and tactile cues throughout for technique. Recumbent Bike [id number] KTOS and figure 4 stretch 2x30 sec bil Bridge with RTB x 12  Clamshell RTB x 10 R/L S/L Supine LTR 15x both ways  Supine march x 10 BLE 3 sec hold RTB  Manual Therapy: STM to R glutes and piriformis with  foam roll     PATIENT EDUCATION:  Education details: HEP update 11/09/22.  Person educated: Patient Education method: Explanation, Demonstration, Verbal cues, and Handouts Education comprehension: verbalized understanding and returned demonstration  HOME EXERCISE PROGRAM: Access Code: [email or web address] URL: [email or web address] Date: 11/09/2022 Prepared by: Glenetta Hew  Exercises - Seated Cervical Retraction  - 3 x daily - 7 x weekly - 1 sets - 10 reps - Seated Scapular Retraction  - 3 x daily - 7 x weekly - 1 sets - 10 reps - Seated Shoulder Rolls  - 3 x daily - 7 x weekly - 1 sets - 10 reps - Cervical Extension AROM with Strap  - 1 x daily - 7 x weekly - 1 sets - 10 reps - Seated Assisted Cervical Rotation with Towel  - 1 x daily - 7 x weekly - 1 sets - 10 reps - Standing Thoracic Open Book at [place]  - 1 x daily - 7 x weekly - 1 sets - 10 reps - Supine Posterior Pelvic Tilt  - 1 x daily - 7 x weekly - 2 sets - 10 reps - 3 sec  hold - Supine Transversus Abdominis Bracing with Double Leg Fallout  - 1 x daily - 7 x weekly - 2 sets - 10 reps - Clamshell  - 1 x daily - 7 x weekly - 1 sets - 10 reps  ASSESSMENT:  CLINICAL IMPRESSION: Pt demonstrated improved cervical ROM, not quite 10 deg greater in all directions but good improvement. She scored 30% on modified Oswestry. She reports 60% improvement in neck pain since beginning PT. She has definitely progressed toward goals. Functionally she still notes pain when bending over to pick things up or doing household activities using her hands which strains her neck still. I anticipate she will need recert  at end of POC. Pt would continue to benefit from skilled PT to address deficits and improve function    OBJECTIVE IMPAIRMENTS: decreased activity tolerance, decreased mobility, decreased ROM, decreased strength, dizziness, hypomobility, increased fascial restrictions, impaired perceived functional ability, increased muscle spasms, impaired flexibility, postural dysfunction, and pain.   ACTIVITY LIMITATIONS: carrying, lifting, bending, standing, sleeping, stairs, transfers, dressing, reach over head, and hygiene/grooming  PARTICIPATION LIMITATIONS: meal prep, cleaning, laundry, driving, shopping, and community activity  PERSONAL FACTORS: Age, Time since onset of injury/illness/exacerbation, and 3+ comorbidities: angina, hypothyroidism, neck surgery, neuropathy, R RTC repair, osteoporosis, CAD, history MI, BPPV, chronic low back pain  are also affecting patient's functional outcome.   REHAB POTENTIAL: Good  CLINICAL DECISION MAKING: Evolving/moderate complexity  EVALUATION COMPLEXITY: Moderate   GOALS: Goals reviewed with patient? Yes  SHORT TERM GOALS: Target date: 11/20/2022   Patient will be independent with initial HEP.  Baseline: given Goal status: MET - 11/21/22   LONG TERM GOALS: Target date: 01/01/2023   Patient will be independent with advanced/ongoing HEP to improve outcomes and carryover.  Baseline:  Goal status: IN PROGRESS  2.  Patient will report 75% improvement in neck pain to improve QOL.  Baseline: severe Goal status: IN PROGRESS- 12/19/22 60% improvement  3.  Patient will demonstrate improved cervical ROM by 10 deg all directions for safety with driving.  Baseline: see objective Goal status: IN PROGRESS- 12/19/22  4.  Patient will report 15% improvement on NDI to demonstrate improved functional ability.  Baseline: 46% Goal status: IN PROGRESS  5.  Patient will report 75% improvement in low back pain to improve quality of life. Baseline:   Goal status: IN  PROGRESS- 60-70% improvement 12/5  6. Patient will report 12% improvement on modified Oswestry to demonstrate improved functional ability. Baseline: TBA Goal status: IN PROGRESS - 15/50 30% 12/19/22  7. Patient will will be able to return to community-based exercise program to maintain progress. Baseline: stopped exercising due to pain Goal status: IN PROGRESS    PLAN:  PT FREQUENCY: 1-2x/week  PT DURATION: 8 weeks  PLANNED INTERVENTIONS: Therapeutic exercises, Therapeutic activity, Neuromuscular re-education, Balance training, Gait training, Patient/Family education, Self Care, Joint mobilization, Stair training, Dry Needling, Electrical stimulation, Spinal mobilization, Cryotherapy, Moist heat, Taping, Traction, Ultrasound, Manual therapy, and Re-evaluation  PLAN FOR NEXT SESSION: continue gentle strengthing for core and posture, manual therapy, modalities PRN, will consider dry needling.    Artist Pais, PTA 12/19/2022, 5:01 PM

## 2022-12-20 ENCOUNTER — Other Ambulatory Visit: Payer: Self-pay | Admitting: Internal Medicine

## 2022-12-20 DIAGNOSIS — I10 Essential (primary) hypertension: Secondary | ICD-10-CM

## 2022-12-27 ENCOUNTER — Ambulatory Visit (INDEPENDENT_AMBULATORY_CARE_PROVIDER_SITE_OTHER): Payer: Medicare Other | Admitting: Podiatry

## 2022-12-27 ENCOUNTER — Ambulatory Visit: Payer: 59 | Admitting: Physical Therapy

## 2022-12-27 ENCOUNTER — Ambulatory Visit (INDEPENDENT_AMBULATORY_CARE_PROVIDER_SITE_OTHER): Payer: Medicare Other

## 2022-12-27 DIAGNOSIS — M25571 Pain in right ankle and joints of right foot: Secondary | ICD-10-CM

## 2022-12-27 DIAGNOSIS — M7672 Peroneal tendinitis, left leg: Secondary | ICD-10-CM | POA: Diagnosis not present

## 2022-12-27 DIAGNOSIS — M25471 Effusion, right ankle: Secondary | ICD-10-CM

## 2022-12-27 DIAGNOSIS — M25572 Pain in left ankle and joints of left foot: Secondary | ICD-10-CM | POA: Diagnosis not present

## 2022-12-27 DIAGNOSIS — M25472 Effusion, left ankle: Secondary | ICD-10-CM | POA: Diagnosis not present

## 2022-12-27 DIAGNOSIS — B351 Tinea unguium: Secondary | ICD-10-CM

## 2022-12-27 MED ORDER — CICLOPIROX 8 % EX SOLN: Freq: Every day | CUTANEOUS | 0 refills | Status: DC

## 2022-12-27 MED ORDER — UREA 10 % EX CREA: TOPICAL_CREAM | CUTANEOUS | 0 refills | Status: AC | PRN

## 2022-12-27 NOTE — Progress Notes (Signed)
Subjective:  Patient ID: [name], female    DOB: 1946/02/24,  MRN: [id number]  Chief Complaint  Patient presents with   Foot Problem    Swelling in ankles and feet    77 y.o. female presents with concern for bilateral foot and ankle swelling. States this problem has been going for a long time. Has previously had vein procedures done in bilateral lower leg by the vein center Dr. Aleda Grana. She reports no pain associated with the swelling but does report some pain in the lateral left ankle. Pain with walking and moving the ankle a certain way. Also reprots nail discoloration and some callus skin present on both feet despite lotion use.   Past Medical History:  Diagnosis Date   Acute MI, subendocardial ([place])    Arthritis    Back pain 05/31/2013   Benign paroxysmal positional vertigo 08/05/2016   CAD (coronary artery disease)    a. NSTEMI 10/2011: [place] 11/19/11: pLAD 30%, oOM 60%, AVCFX 30%, CFX after OM2 30%, pRCA 60 and 70%, then 99%, AM 80-90% with TIMI 3 flow.  PCI: Promus DES x 2 to RCA; b. 06/2012 Cath: patent RCA stents w/ subtl occl of Acute Marginal (jailed)->Med rx; c. 05/2015 MV: EF 59%, mod mid infsept/inf/ap lat/ap infarct with peri-infarct isch-->Med Rx; d. 02/2016 Cath: LM nl, LAD 72m RI 50, RCA patent stents.   Colitis    Facial skin lesion 01/28/2017   GERD (gastroesophageal reflux disease)    occasional   Hyperlipidemia    Hypertension    Hypertensive heart disease    a. Echocardiogram 11/19/11: Difficult acoustic windows, EF 60-65%, normal LV wall thickness, grade 1 diastolic dysfunction   Hypoparathyroidism (HCC)    Low zinc level 11/26/2016   Multinodular thyroid    Goiter s/p thyroidectomy in 2007 with post-op  hypocalcemia and post-op hypothyroidism/hypoparathyroidism with hypocalcemia   Neck pain on right side 07/13/2013   Nocturia 05/31/2013   Osteoarthritis    Pain in right axilla 03/13/2016   Palpitations    a. 03/2012 - patient set up for event monitor but  did not wear correctly -  she declined wearing a repeat monitor   Post-surgical hypothyroidism    Sun-damaged skin 12/05/2014   Tubular adenoma of colon 06/2011   Unspecified constipation 05/31/2013   Vertigo    Zinc deficiency 11/26/2015    Allergies  Allergen Reactions   Zetia [Ezetimibe] Other (See Comments)    Myalgia, paresthesias and weakness   Penicillin G Rash    ROS: Negative except as per HPI above  Objective:  General: AAO x3, NAD  Dermatological: Onychomycosis of the nails of the lesser toes of both feet with dystrophy abnormal growth discoloration thickening.  The hallux nails have been removed previously.  Vascular: Venous varicosities are present bilaterally, significant edema is present.  Neruologic: Grossly intact via light touch bilateral. Protective threshold intact to all sites bilateral.   Musculoskeletal: Significant tenderness with palpation along the course of the peroneal tendons on the left lateral ankle posterior to the lateral malleolus.  Edema of the bilateral ankle however there is only pain with palpation of the lateral ankle of the left side.  Gait: Unassisted, Nonantalgic.   No images are attached to the encounter.  Radiographs:  Date: 12/27/2022 XR bilateral ankle weightbearing AP/Lateral/Oblique   Findings: no fracture, dislocation, swelling or degenerative changes noted edema is noted of the soft tissues about the ankle bilaterally Assessment:   1. Peroneal tendinitis, left   2. Acute bilateral ankle  pain   3. Ankle edema, bilateral   4. Onychomycosis      Plan:  Patient was evaluated and treated and all questions answered.  # Peroneal tendinitis left ankle -Discussed treatment options with the patient in detail as well as the nature of her inflammation along the course of the peroneal tendons. -Recommend treatment with steroid injection.  Patient was agreeable.  After sterile swab of alcohol for skin prep injected 1 cc half percent  Marcaine plain with 1 cc Kenalog 10 into and about the most painful area at the posterior lateral ankle along the course of the peroneal tendon.  Patient tolerated well sterile Band-Aid was applied. -Also recommend use of the Tri-Lock ankle brace which believe will help to decrease the excess strain and motion on the peroneal tendons during inversion eversion range of motion. -Tri-Lock ankle brace dispensed to the patient at this visit. -Will consider physical therapy in the future if not improving.  # Bilateral lower extremity edema/chronic venous insufficiency -Recommend compression stockings -Placed referral to vein center but patient states she will likely follow-up with Dr. Aleda Grana at his office which I believe to be different than the referral that I placed.  I am okay with her going to see whether she feels most comfortable with for the evaluation of bilateral chronic venous insufficiency  #Callus skin bilateral foot -Recommend urea 10% lotion to callus areas as needed.  E Rx sent to the pharmacy for this medication.  Onychomycosis -Educated on etiology of nail fungus. -Pt wishes to avoid use of oral antifungal medicatoin -eRx for penlac 8% solution per pt request.     Return in about 6 weeks (around 02/07/2023) for Follow-up left ankle peroneal tendinitis, swelling.          Everitt Amber, DPM [facility]

## 2022-12-28 ENCOUNTER — Ambulatory Visit: Payer: 59 | Admitting: Physical Therapy

## 2022-12-28 ENCOUNTER — Other Ambulatory Visit: Payer: Self-pay | Admitting: Physician Assistant

## 2022-12-28 DIAGNOSIS — E892 Postprocedural hypoparathyroidism: Secondary | ICD-10-CM

## 2022-12-31 NOTE — Telephone Encounter (Signed)
Pt's pharmacy is requesting a refill on calcitriol. Would Dr. Harrington Challenger like to refill this medication? Please address

## 2023-01-01 ENCOUNTER — Ambulatory Visit: Payer: 59 | Attending: Family Medicine | Admitting: Physical Therapy

## 2023-01-01 ENCOUNTER — Encounter: Payer: Self-pay | Admitting: Physical Therapy

## 2023-01-01 DIAGNOSIS — R252 Cramp and spasm: Secondary | ICD-10-CM

## 2023-01-01 DIAGNOSIS — M5441 Lumbago with sciatica, right side: Secondary | ICD-10-CM | POA: Diagnosis not present

## 2023-01-01 DIAGNOSIS — G8929 Other chronic pain: Secondary | ICD-10-CM | POA: Insufficient documentation

## 2023-01-01 DIAGNOSIS — R293 Abnormal posture: Secondary | ICD-10-CM | POA: Diagnosis not present

## 2023-01-01 DIAGNOSIS — M542 Cervicalgia: Secondary | ICD-10-CM | POA: Diagnosis not present

## 2023-01-01 NOTE — Therapy (Signed)
OUTPATIENT PHYSICAL THERAPY TREATMENT AND RECERTIFICATION  Patient Name: [name] MRN: [id number] DOB:[birth year], 77 y.o., female Today's Date: 01/01/2023   PT End of Session - 01/01/23 1038     Visit Number 11    Number of Visits 19    Date for PT Re-Evaluation 01/31/23    Authorization Type [employer] Medicare    Authorization - Visit Number 11    Progress Note Due on Visit 20    PT Start Time 1002    PT Stop Time 1045    PT Time Calculation (min) 43 min    Activity Tolerance Patient tolerated treatment well    Behavior During Therapy WFL for tasks assessed/performed                   Past Medical History:  Diagnosis Date   Acute MI, subendocardial ([place])    Arthritis    Back pain 05/31/2013   Benign paroxysmal positional vertigo 08/05/2016   CAD (coronary artery disease)    a. NSTEMI 10/2011: [place] 11/19/11: pLAD 30%, oOM 60%, AVCFX 30%, CFX after OM2 30%, pRCA 60 and 70%, then 99%, AM 80-90% with TIMI 3 flow.  PCI: Promus DES x 2 to RCA; b. 06/2012 Cath: patent RCA stents w/ subtl occl of Acute Marginal (jailed)->Med rx; c. 05/2015 MV: EF 59%, mod mid infsept/inf/ap lat/ap infarct with peri-infarct isch-->Med Rx; d. 02/2016 Cath: LM nl, LAD 64m RI 50, RCA patent stents.   Colitis    Facial skin lesion 01/28/2017   GERD (gastroesophageal reflux disease)    occasional   Hyperlipidemia    Hypertension    Hypertensive heart disease    a. Echocardiogram 11/19/11: Difficult acoustic windows, EF 60-65%, normal LV wall thickness, grade 1 diastolic dysfunction   Hypoparathyroidism (HCC)    Low zinc level 11/26/2016   Multinodular thyroid    Goiter s/p thyroidectomy in 2007 with post-op  hypocalcemia and post-op hypothyroidism/hypoparathyroidism with hypocalcemia   Neck pain on right side 07/13/2013   Nocturia 05/31/2013   Osteoarthritis    Pain in right axilla 03/13/2016   Palpitations    a. 03/2012 - patient set up for event monitor but did not wear correctly -  she declined  wearing a repeat monitor   Post-surgical hypothyroidism    Sun-damaged skin 12/05/2014   Tubular adenoma of colon 06/2011   Unspecified constipation 05/31/2013   Vertigo    Zinc deficiency 11/26/2015   Past Surgical History:  Procedure Laterality Date   CARDIAC CATHETERIZATION N/A 03/08/2016   Procedure: Left Heart Cath and Coronary Angiography;  Surgeon: JJettie Booze MD;  [facility];  Service: Cardiovascular;  Laterality: N/A;   COLONOSCOPY  Aenomatous polyps   07/05/2011   COLONOSCOPY N/A 09/28/2014   Procedure: COLONOSCOPY;  Surgeon: MLadene Artist MD;  Location: [facility];  Service: Endoscopy;  Laterality: N/A;   CORONARY ANGIOPLASTY WITH STENT PLACEMENT     LEFT HEART CATH AND CORONARY ANGIOGRAPHY N/A 12/09/2018   Procedure: LEFT HEART CATH AND CORONARY ANGIOGRAPHY;  Surgeon: VJettie Booze MD;  [facility];  Service: Cardiovascular;  Laterality: N/A;   LEFT HEART CATHETERIZATION WITH CORONARY ANGIOGRAM N/A 07/17/2012   Procedure: LEFT HEART CATHETERIZATION WITH CORONARY ANGIOGRAM;  Surgeon: THillary Bow MD;  [facility];  Service: Cardiovascular;  Laterality: N/A;   PERCUTANEOUS CORONARY STENT INTERVENTION (PCI-S) N/A 11/19/2011   Procedure: PERCUTANEOUS CORONARY STENT INTERVENTION (PCI-S);  Surgeon: THillary Bow MD;  [facility];  Service: Cardiovascular;  Laterality: N/A;   POLYPECTOMY     SHOULDER ARTHROSCOPY W/ ROTATOR CUFF REPAIR     right   TOTAL THYROIDECTOMY  2007   GOITER   Patient Active Problem List   Diagnosis Date Noted   Paresthesia 08/28/2022   Chronic neck pain 08/28/2022   Neuropathy 08/21/2022   Vitamin deficiency 10/23/2021   Hypoglycemia 10/07/2019   Heart disease 01/29/2019   Sensorineural hearing loss (SNHL) of both ears 01/27/2019   Primary osteoarthritis of both knees 12/19/2018   DDD (degenerative disc disease), lumbar 12/19/2018   ANA positive 10/27/2018   Anxiety  02/17/2018   Peripheral arterial disease ([place]) 02/20/2017   Skin lesion of face 01/28/2017   Benign paroxysmal positional vertigo 08/05/2016   Antiplatelet or antithrombotic long-term use 03/27/2016   Right lumbar radiculopathy 02/21/2016   Abnormality of gait 02/21/2016   Zinc deficiency 11/26/2015   Right hip pain 10/26/2015   Medicare annual wellness visit, subsequent 03/13/2015   Preventative health care 03/13/2015   Sun-damaged skin 12/05/2014   Angina of effort 12/02/2014   Hx of colonic polyps 09/28/2014   Benign neoplasm of descending colon 09/28/2014   Other fatigue 09/10/2014   Personal history of colonic polyps 07/16/2014   Bilateral thumb pain 03/20/2014   Postsurgical hypoparathyroidism ([place]) 08/03/2013   Neck pain on right side 07/13/2013   Constipation 05/31/2013   Back pain 05/31/2013   Nocturia 05/31/2013   GERD (gastroesophageal reflux disease) 11/02/2012   Tension headache 11/02/2012   Anemia 06/11/2012   Hypokalemia 06/11/2012   Depression 01/21/2012   Headache(784.0) 12/23/2011   Bradycardia 12/07/2011   Hypocalcemia 12/07/2011   Acute MI, subendocardial ([place]) 11/20/2011   Hypothyroidism 05/20/2009   Essential hypertension, benign 05/20/2009   Hyperlipidemia 03/08/2009   CAD, NATIVE VESSEL 03/08/2009    PCP: Mosie Lukes, MD  REFERRING PROVIDER: Glenford Peers, NP  REFERRING DIAG: [phone] (ICD-10-CM) - Other spondylosis with radiculopathy, cervical region  THERAPY DIAG:  Cervicalgia  Chronic right-sided low back pain with right-sided sciatica  Abnormal posture  Cramp and spasm  Rationale for Evaluation and Treatment: Rehabilitation  ONSET DATE: chronic for years  SUBJECTIVE:                                                                                                                                                                                                         SUBJECTIVE STATEMENT: Pt injured Lt ankle and is now wearing a  brace. She has antalgic gait walking into clinic today. She states she "can't complain" about  back or neck pain  PERTINENT HISTORY:  Surgical hypoparathyrodism, angina (has nitro), Hypothyroidism, hyperlipidemia, HTN, neuropathy hands and feet, R RTC repair  PAIN:  Are you having pain? Yes: NPRS scale: 0/10 Pain location: L shoulder/pecs Pain description: sometimes sharp shooting, sometimes arms feel weak, constant pain Aggravating factors: moving head overhead, reaching behind her back, turning neck (gets dizzy),  Relieving factors: holding arms close, avoiding movement.   Are you having pain? Yes: NPRS scale: 5/10 today, pain with bending and sleeping on R side Pain location: low back, down R side to mid calf, sometimes numbness in feet with prolonged standing Pain description: pain and numbness Aggravating factors: bending over Relieving factors: laying down, icy/hot and patches, hot showers  PRECAUTIONS: None  WEIGHT BEARING RESTRICTIONS: No  FALLS:  Has patient fallen in last 6 months? No  LIVING ENVIRONMENT: Lives with: lives alone Lives in: House/apartment Stairs: No Has following equipment at home: None  OCCUPATION: retired  PLOF: Independent and Leisure: was going to [employer] but stopped due to pain  PATIENT GOALS: I want to be pain free without medications, know what machines are safe to use  NEXT MD VISIT: 02/02/23 with PCP  OBJECTIVE:   DIAGNOSTIC FINDINGS:  MRI done but not available to review  PATIENT SURVEYS:  NDI 23/50 = 46%  moderate disability   COGNITION: Overall cognitive status: Within functional limits for tasks assessed  SENSATION: Light touch: WFL  POSTURE: rounded shoulders and forward head  PALPATION: Tenderness throughout bil upper traps, levator scapulae, and cervical paraspinals, wincing and jumping away from firm touch.    CERVICAL ROM:   Active ROM AROM (deg) eval AROM 12/19/22  Flexion 15 22  Extension 20 25  Right lateral  flexion 10 29  Left lateral flexion 15 32  Right rotation 40 50  Left rotation 40 50   (Blank rows = not tested)  UPPER EXTREMITY MMT:   MMT Right eval Left eval  Shoulder flexion 4+ 4+  Shoulder extension    Shoulder abduction 4+* 4  Shoulder adduction    Shoulder internal rotation 5 5  Shoulder external rotation 5 5  Elbow flexion 5 5  Elbow extension 5 5  Wrist flexion 5 5  Wrist extension 5 5   (Blank rows = not tested)  UPPER EXTREMITY ROM:  MMT Right eval Left eval  Shoulder flexion 115* 120  Shoulder extension 40 65  Shoulder abduction 100 135  Shoulder adduction    Shoulder internal rotation    Shoulder external rotation     (Blank rows = not tested)  LOWER EXTREMITY MMT:    MMT Right eval Left eval  Hip flexion 4 4  Hip extension    Hip abduction 5 5  Hip adduction 4+ 4+  Hip internal rotation    Hip external rotation    Knee flexion    Knee extension 5 5  Ankle dorsiflexion 5 5  Ankle plantarflexion     (Blank rows = not tested)   LUMBAR ROM:   Active  AROM  11/09/2022 AROM 01/01/23  Flexion To mid-thigh Mid shin  Extension WNL, no pain, but increased pain after repeated 5x Limited 50% - pain  Right lateral flexion Inc pain R side back, to knee To knee - increased pain  Left lateral flexion To mid thigh, increased pain R side back To knee - increased pain  Right rotation WNL WNL  Left rotation WNL WNL   (Blank rows = not tested)   CERVICAL  SPECIAL TESTS:  Spurling's test: Positive right  FUNCTIONAL TESTS:  5 times sit to stand: 35 seconds Increased sciatic pain with R leg extension in sitting.   5x STS: 31 seconds (01/01/23) TODAY'S TREATMENT:                                                                                                                              DATE:  [facility] Adult PT Treatment:                                                DATE: 01/01/23 Therapeutic Exercise: Bike L4 x 5 min warm up Pallof press green TB x 10  bilat Row 20# 2 x 10 Lat pull down 20# 2 x 10 Side step green TB around ankles 2 x 10 steps bilat Sit <> stand 5# x 10, 8# x 10 Seated pelvic tilt x 10 Supine ab activation with marching 2 x 10 bilat SLR 2 x 10 bilat   12/19/22 Therapeutic Exercise: to improve strength and mobility.  Demo, verbal and tactile cues throughout for technique. Bike L2x86mn [place] 10lb 2x in eahc hand Standing hip hinge x 10 with cane to keep back straight Seated row 20# 2x15 Seated lat pulls 20# 2x15  12/07/22 Therapeutic Exercise: to improve strength and mobility.  Demo, verbal and tactile cues throughout for technique. Rec Bike L2x540m Seated hip hinge x 5, then 5 with 8# Standing pallof rotation RTB x 10 ea way Standing pallof press 1x15 BTB bil Farmer carries 2x90 ft unilateral 10lb TrA pedals in supine x 10 bil SLR x 10 slow pace Quadruped hip extension x 7 Seated server stretch for levator post manual (palms up, pinkies and elbows together and push hands forward. 2 x15 sec  Manual Therapy: to decrease muscle spasm and pain and improve mobility STM to bil UT, LS [facility]PR [facility] Exercise: to improve strength and mobility.  Demo, verbal and tactile cues throughout for technique. Rec Bike [id number] hip hinge x 10 Seated pallof press 2x10 RTB bil Farmer carries 1x90 ft unilateral 10lb, then 1x90 ft each with 10# off of side and 8# OH in contralateral UE TrA pedals in supine x 10 bil SLR x 10 3 sec hold bil Quadruped arm raises x 10  Quadruped hip extension x 10   12/04/2022 Therapeutic Exercise: to improve strength and mobility.  Demo, verbal and tactile cues throughout for technique. UBE forward x 4 min  Standing rows RTB x 10 Standing extension RTB x 10  Bil ER x 10 Supine LTR x 10  SKTC stretch 2 x 30 sec  Pelvic tilts x 10  Supine bridges x 10 - cramping in hamstring today Supine clamshell RTB 2 x 10  Manual Therapy: to decrease muscle spasm  and pain and improve mobility STM/TPR to L pectoralis, bil UT, cervical paraspinals, gentle cervical traction and NAGs into rotation, IASTM to lumbar paraspinals and glutes, MFR to bil QL, STM to lumbar erector spinae.  Increased pain with even grade 1 mobs to lumbar spine, UPA or PA.  Modalities: MHP x 8 min to neck and low back in prone to decrease pain.   11/30/2022 Therapeutic Exercise: to improve strength and mobility.  Demo, verbal and tactile cues throughout for technique. Recumbent Bike L2x25mn  Supine KTOS and figure 4 stretch 2x30 sec bil Bridge with RTB x 12  Clamshell RTB x 10 R/L S/L Supine LTR 15x both ways  Supine march x 10 BLE 3 sec hold RTB  Manual Therapy: STM to R glutes and piriformis with foam roll     PATIENT EDUCATION:  Education details: HEP update 11/09/22.  Person educated: Patient Education method: Explanation, Demonstration, Verbal cues, and Handouts Education comprehension: verbalized understanding and returned demonstration  HOME EXERCISE PROGRAM: Access Code: [email or web address] URL: [email or web address] Date: 11/09/2022 Prepared by: EGlenetta Hew Exercises - Seated Cervical Retraction  - 3 x daily - 7 x weekly - 1 sets - 10 reps - Seated Scapular Retraction  - 3 x daily - 7 x weekly - 1 sets - 10 reps - Seated Shoulder Rolls  - 3 x daily - 7 x weekly - 1 sets - 10 reps - Cervical Extension AROM with Strap  - 1 x daily - 7 x weekly - 1 sets - 10 reps - Seated Assisted Cervical Rotation with Towel  - 1 x daily - 7 x weekly - 1 sets - 10 reps - Standing Thoracic [place] - 1 x daily - 7 x weekly - 1 sets - 10 reps - Supine Posterior Pelvic Tilt  - 1 x daily - 7 x weekly - 2 sets - 10 reps - 3 sec  hold - Supine Transversus Abdominis Bracing with Double Leg Fallout  - 1 x daily - 7 x weekly - 2 sets - 10 reps - Clamshell  - 1 x daily - 7 x weekly - 1 sets - 10 reps  ASSESSMENT:  CLINICAL IMPRESSION: Pt has improved lumbar ROM  but is still experiencing pain with lumbar mobility in all directions. She improved 5x STS but is still below norms for her age. Good tolerance to exercises today. Pt still hesitant to try dry needling at this time. Pt will continue to benefit from skilled PT to reduce pain, improve strength and ROM and improve functional activity tolerance.    OBJECTIVE IMPAIRMENTS: decreased activity tolerance, decreased mobility, decreased ROM, decreased strength, dizziness, hypomobility, increased fascial restrictions, impaired perceived functional ability, increased muscle spasms, impaired flexibility, postural dysfunction, and pain.   ACTIVITY LIMITATIONS: carrying, lifting, bending, standing, sleeping, stairs, transfers, dressing, reach over head, and hygiene/grooming  PARTICIPATION LIMITATIONS: meal prep, cleaning, laundry, driving, shopping, and community activity  PERSONAL FACTORS: Age, Time since onset of injury/illness/exacerbation, and 3+ comorbidities: angina, hypothyroidism, neck surgery, neuropathy, R RTC repair, osteoporosis, CAD, history MI, BPPV, chronic low back pain  are also affecting patient's functional outcome.   REHAB POTENTIAL: Good  CLINICAL DECISION MAKING: Evolving/moderate complexity  EVALUATION COMPLEXITY: Moderate   GOALS: Goals reviewed with patient? Yes  SHORT TERM GOALS: Target date: 11/20/2022   Patient will be independent with initial HEP.  Baseline: given Goal status: MET - 11/21/22   LONG TERM GOALS: Target date: 01/31/2023   Patient will be independent with  advanced/ongoing HEP to improve outcomes and carryover.  Baseline:  Goal status: IN PROGRESS  2.  Patient will report 75% improvement in neck pain to improve QOL.  Baseline: severe Goal status: IN PROGRESS- 12/19/22 60% improvement  3.  Patient will demonstrate improved cervical ROM by 10 deg all directions for safety with driving.  Baseline: see objective Goal status: IN PROGRESS- 12/19/22  4.   Patient will report 15% improvement on NDI to demonstrate improved functional ability.  Baseline: 46% Goal status: IN PROGRESS  5.  Patient will report 75% improvement in low back pain to improve quality of life. Baseline:   Goal status: IN PROGRESS- 60-70% improvement 12/5  6. Patient will report 12% improvement on modified Oswestry to demonstrate improved functional ability. Baseline: TBA Goal status: IN PROGRESS - 15/50 30% 12/19/22  7. Patient will will be able to return to community-based exercise program to maintain progress. Baseline: stopped exercising due to pain Goal status: IN PROGRESS    PLAN:  PT FREQUENCY: 1-2x/week  PT DURATION: 4 weeks  PLANNED INTERVENTIONS: Therapeutic exercises, Therapeutic activity, Neuromuscular re-education, Balance training, Gait training, Patient/Family education, Self Care, Joint mobilization, Stair training, Dry Needling, Electrical stimulation, Spinal mobilization, Cryotherapy, Moist heat, Taping, Traction, Ultrasound, Manual therapy, and Re-evaluation  PLAN FOR NEXT SESSION: continue gentle strengthing for core and posture, manual therapy, modalities PRN, will consider dry needling.    Edee Nifong, PT 01/01/2023, 10:39 AM

## 2023-01-08 ENCOUNTER — Other Ambulatory Visit: Payer: Self-pay

## 2023-01-08 ENCOUNTER — Other Ambulatory Visit: Payer: Self-pay | Admitting: Internal Medicine

## 2023-01-08 DIAGNOSIS — R079 Chest pain, unspecified: Secondary | ICD-10-CM

## 2023-01-08 MED ORDER — AMLODIPINE BESYLATE 10 MG PO TABS: 10.0000 mg | ORAL_TABLET | Freq: Every day | ORAL | 2 refills | Status: DC

## 2023-01-11 DIAGNOSIS — M79604 Pain in right leg: Secondary | ICD-10-CM | POA: Diagnosis not present

## 2023-01-11 DIAGNOSIS — I83893 Varicose veins of bilateral lower extremities with other complications: Secondary | ICD-10-CM | POA: Diagnosis not present

## 2023-01-11 DIAGNOSIS — R600 Localized edema: Secondary | ICD-10-CM | POA: Diagnosis not present

## 2023-01-11 DIAGNOSIS — I872 Venous insufficiency (chronic) (peripheral): Secondary | ICD-10-CM | POA: Diagnosis not present

## 2023-01-11 DIAGNOSIS — M79605 Pain in left leg: Secondary | ICD-10-CM | POA: Diagnosis not present

## 2023-01-13 NOTE — Progress Notes (Signed)
Cardiology Office Note   Date:  01/14/2023   ID:  [name], DOB October 17, 1946, MRN [id number]  PCP:  Mosie Lukes, MD  Cardiologist:   Dorris Carnes, MD   F/U of CAD    History of Present Illness:  [name] is a 77 y.o. female with CAD - s/p NSTEMI 10/2011 with DESx2  Also has a hx of  GERD, HTN, HLD, vertigo.  L heart cath in Dec 2019 showed:  LAD with 25%  OM  with 100% with Left to R  collaterals  RCA with patent stent   Normal LVEF    Echo in early Sept 2020   LVEF and RVEF were normal   Mild diastolic dysfunction      In 2023 the pt had some numbness on L face   Seen by  neuro    ? TIA  Echo done   Normal   Carotid USN without severe dz   Symptoms felt to be due to DJD of neck       Pt was seen in clnic in Oct 2023  Since seen she has occsaional CP    Non change   She has not had any more neurologic symtpoms    Breathing is OK She is having a lot of problems with her back   Went to PT  Still in pain  Current Meds  Medication Sig   aspirin EC 81 MG tablet Take 81 mg by mouth daily.   B Complex-C (B-COMPLEX WITH VITAMIN C) tablet Take 1 tablet by mouth daily.   bisoprolol (ZEBETA) 5 MG tablet Take 1 tablet (5 mg total) by mouth daily.   calcitRIOL (ROCALTROL) 0.25 MCG capsule TAKE 3 CAPSULES BY MOUTH DAILY   Cholecalciferol (VITAMIN D-3) 25 MCG (1000 UT) CAPS Take 3000-4000 units by mouth daily   ciclopirox (PENLAC) 8 % solution Apply topically at bedtime. Apply over nail and surrounding skin. Apply daily over previous coat. After seven (7) days, may remove with alcohol and continue cycle.   clopidogrel (PLAVIX) 75 MG tablet Take 1 tablet (75 mg total) by mouth daily.   diazepam (VALIUM) 5 MG tablet Take 1 tablet (5 mg total) by mouth daily as needed for anxiety (MRI). Take 1 one hour prior to MRI and another when arrive   furosemide (LASIX) 20 MG tablet 1 tab po daily prn severe pedal edema   isosorbide mononitrate (IMDUR) 60 MG 24 hr tablet Take 1 tablet (60 mg  total) by mouth daily.   levothyroxine (SYNTHROID) 112 MCG tablet TAKE 1 TABLET BY MOUTH DAILY  BEFORE BREAKFAST   Multiple Vitamin (MULTIVITAMIN WITH MINERALS) TABS tablet Take 1 tablet by mouth daily.   oxyCODONE (ROXICODONE) 5 MG immediate release tablet Take 1 tablet (5 mg total) by mouth every 6 (six) hours as needed for up to 20 doses for breakthrough pain.   pantoprazole (PROTONIX) 40 MG tablet TAKE 1 TABLET BY MOUTH  DAILY   REPATHA SURECLICK 235 MG/ML SOAJ INJECT '140MG'$  SUBCUTANEOUSLY EVERY 2 WEEKS   rosuvastatin (CRESTOR) 40 MG tablet TAKE 1 TABLET BY MOUTH DAILY   urea (CARMOL) 10 % cream Apply topically as needed.   [DISCONTINUED] amLODipine (NORVASC) 10 MG tablet Take 1 tablet (10 mg total) by mouth daily.   [DISCONTINUED] nitroGLYCERIN (NITROSTAT) 0.4 MG SL tablet DISSOLVE 1 TABLET UNDER THE  TONGUE EVERY 5 MINUTES AS NEEDED FOR CHEST PAIN. MAX OF 3 TABLETS IN 15 MINUTES. CALL 911 IF PAIN  PERSISTS.  Allergies:   Zetia [ezetimibe] and Penicillin g   Past Medical History:  Diagnosis Date   Acute MI, subendocardial ([place])    Arthritis    Back pain 05/31/2013   Benign paroxysmal positional vertigo 08/05/2016   CAD (coronary artery disease)    a. NSTEMI 10/2011: [facility] 11/19/11: pLAD 30%, oOM 60%, AVCFX 30%, CFX after OM2 30%, pRCA 60 and 70%, then 99%, AM 80-90% with TIMI 3 flow.  PCI: Promus DES x 2 to RCA; b. 06/2012 Cath: patent RCA stents w/ subtl occl of Acute Marginal (jailed)->Med rx; c. 05/2015 MV: EF 59%, mod mid infsept/inf/ap lat/ap infarct with peri-infarct isch-->Med Rx; d. 02/2016 Cath: LM nl, LAD 72m RI 50, RCA patent stents.   Colitis    Facial skin lesion 01/28/2017   GERD (gastroesophageal reflux disease)    occasional   Hyperlipidemia    Hypertension    Hypertensive heart disease    a. Echocardiogram 11/19/11: Difficult acoustic windows, EF 60-65%, normal LV wall thickness, grade 1 diastolic dysfunction   Hypoparathyroidism (HCC)    Low zinc level 11/26/2016    Multinodular thyroid    Goiter s/p thyroidectomy in 2007 with post-op  hypocalcemia and post-op hypothyroidism/hypoparathyroidism with hypocalcemia   Neck pain on right side 07/13/2013   Nocturia 05/31/2013   Osteoarthritis    Pain in right axilla 03/13/2016   Palpitations    a. 03/2012 - patient set up for event monitor but did not wear correctly -  she declined wearing a repeat monitor   Post-surgical hypothyroidism    Sun-damaged skin 12/05/2014   Tubular adenoma of colon 06/2011   Unspecified constipation 05/31/2013   Vertigo    Zinc deficiency 11/26/2015    Past Surgical History:  Procedure Laterality Date   CARDIAC CATHETERIZATION N/A 03/08/2016   Procedure: Left Heart Cath and Coronary Angiography;  Surgeon: JJettie Booze MD;  [facility];  Service: Cardiovascular;  Laterality: N/A;   COLONOSCOPY  Aenomatous polyps   07/05/2011   COLONOSCOPY N/A 09/28/2014   Procedure: COLONOSCOPY;  Surgeon: MLadene Artist MD;  Location: [facility];  Service: Endoscopy;  Laterality: N/A;   CORONARY ANGIOPLASTY WITH STENT PLACEMENT     LEFT HEART CATH AND CORONARY ANGIOGRAPHY N/A 12/09/2018   Procedure: LEFT HEART CATH AND CORONARY ANGIOGRAPHY;  Surgeon: VJettie Booze MD;  [facility];  Service: Cardiovascular;  Laterality: N/A;   LEFT HEART CATHETERIZATION WITH CORONARY ANGIOGRAM N/A 07/17/2012   Procedure: LEFT HEART CATHETERIZATION WITH CORONARY ANGIOGRAM;  Surgeon: THillary Bow MD;  [facility];  Service: Cardiovascular;  Laterality: N/A;   PERCUTANEOUS CORONARY STENT INTERVENTION (PCI-S) N/A 11/19/2011   Procedure: PERCUTANEOUS CORONARY STENT INTERVENTION (PCI-S);  Surgeon: THillary Bow MD;  [facility];  Service: Cardiovascular;  Laterality: N/A;   POLYPECTOMY     SHOULDER ARTHROSCOPY W/ ROTATOR CUFF REPAIR     right   TOTAL THYROIDECTOMY  2007   GOITER     Social History:  The patient  reports that she has never  smoked. She has never used smokeless tobacco. She reports that she does not currently use alcohol. She reports that she does not use drugs.   Family History:  The patient's family history includes Blindness in her sister; Cancer in her brother and brother; Colon cancer in her brother; Congestive Heart Failure in her sister; Healthy in her daughter, daughter, daughter, and son; Heart attack in her father; Heart disease in her father; Hypertension  in her father and sister; Thyroid disease in her sister.    ROS:  Please see the history of present illness. All other systems are reviewed and  Negative to the above problem except as noted.    PHYSICAL EXAM: VS:  BP (!) 130/54   Pulse (!) 56   Ht '5\' 5"'$  (1.651 m)   Wt 189 lb 6.4 oz (85.9 kg)   SpO2 97%   BMI 31.52 kg/m   GEN: Well nourished, well developed, in no acute distress  HEENT: normal  Neck: JVP is not elevated   No bruits Cardiac: RRR; no murmurs,   No LE  edema  Respiratory:  clear to auscultation bilaterally GI: soft, nontender, nondistended, + BS   MS: no deformity Moving all extremities   Skin: warm and dry Neuro: CN II to XII intact Psych: euthymic mood, full affect   EKG:  EKG is not ordered today   Echo   Sept 2023   1. Left ventricular ejection fraction, by estimation, is 60 to 65%. The  left ventricle has normal function. The left ventricle has no regional  wall motion abnormalities. There is mild left ventricular hypertrophy.  Left ventricular diastolic parameters  were normal.   2. Right ventricular systolic function is normal. The right ventricular  size is normal.   3. The mitral valve is abnormal. Trivial mitral valve regurgitation. No  evidence of mitral stenosis.   4. The aortic valve is tricuspid. Aortic valve regurgitation is trivial.  No aortic stenosis is present.   5. The inferior vena cava is normal in size with greater than 50%  respiratory variability, suggesting right atrial pressure of 3 mmHg.     Lipid Panel    Component Value Date/Time   CHOL 168 11/06/2022 1623   CHOL 172 10/11/2022 1050   TRIG 283.0 (H) 11/06/2022 1623   HDL 47.90 11/06/2022 1623   HDL 43 10/11/2022 1050   CHOLHDL 4 11/06/2022 1623   VLDL 56.6 (H) 11/06/2022 1623   LDLCALC 101 (H) 10/11/2022 1050   LDLDIRECT 96.0 11/06/2022 1623      Wt Readings from Last 3 Encounters:  01/14/23 189 lb 6.4 oz (85.9 kg)  11/06/22 192 lb (87.1 kg)  10/29/22 190 lb (86.2 kg)      ASSESSMENT AND PLAN:  1  CAD  No symptoms of significant angina   Follow      2   HTN   BP is OK    Contineu current regimen     3  HL   HL   Repeat lipomed   Pt on Repatha and Crestor      Follow up later this spring    Current medicines are reviewed at length with the patient today.  The patient does not have concerns regarding medicines.  Signed, Dorris Carnes, MD  01/14/2023 9:52 PM    [facility] [place], [place], [place]  [address] Phone: [phone]; Fax: [phone]

## 2023-01-14 ENCOUNTER — Encounter: Payer: Self-pay | Admitting: Internal Medicine

## 2023-01-14 ENCOUNTER — Ambulatory Visit: Payer: 59 | Attending: Internal Medicine | Admitting: Internal Medicine

## 2023-01-14 VITALS — BP 130/54 | HR 56 | Ht 65.0 in | Wt 189.4 lb

## 2023-01-14 DIAGNOSIS — M79606 Pain in leg, unspecified: Secondary | ICD-10-CM

## 2023-01-14 DIAGNOSIS — I2510 Atherosclerotic heart disease of native coronary artery without angina pectoris: Secondary | ICD-10-CM | POA: Diagnosis not present

## 2023-01-14 DIAGNOSIS — I739 Peripheral vascular disease, unspecified: Secondary | ICD-10-CM | POA: Diagnosis not present

## 2023-01-14 DIAGNOSIS — Z79899 Other long term (current) drug therapy: Secondary | ICD-10-CM | POA: Diagnosis not present

## 2023-01-14 DIAGNOSIS — E785 Hyperlipidemia, unspecified: Secondary | ICD-10-CM

## 2023-01-14 DIAGNOSIS — M7989 Other specified soft tissue disorders: Secondary | ICD-10-CM | POA: Diagnosis not present

## 2023-01-14 DIAGNOSIS — G459 Transient cerebral ischemic attack, unspecified: Secondary | ICD-10-CM | POA: Diagnosis not present

## 2023-01-14 DIAGNOSIS — R079 Chest pain, unspecified: Secondary | ICD-10-CM

## 2023-01-14 DIAGNOSIS — I519 Heart disease, unspecified: Secondary | ICD-10-CM | POA: Diagnosis not present

## 2023-01-14 DIAGNOSIS — E039 Hypothyroidism, unspecified: Secondary | ICD-10-CM | POA: Diagnosis not present

## 2023-01-14 DIAGNOSIS — I10 Essential (primary) hypertension: Secondary | ICD-10-CM | POA: Diagnosis not present

## 2023-01-14 DIAGNOSIS — R001 Bradycardia, unspecified: Secondary | ICD-10-CM | POA: Diagnosis not present

## 2023-01-14 MED ORDER — AMLODIPINE BESYLATE 10 MG PO TABS: 10.0000 mg | ORAL_TABLET | Freq: Every day | ORAL | 2 refills | Status: DC

## 2023-01-14 MED ORDER — NITROGLYCERIN 0.4 MG SL SUBL: 0.4000 mg | SUBLINGUAL_TABLET | SUBLINGUAL | 3 refills | Status: DC | PRN

## 2023-01-14 NOTE — Patient Instructions (Signed)
Medication Instructions:   *If you need a refill on your cardiac medications before your next appointment, please call your pharmacy*   Lab Work: NMR, BMET, VIT D  If you have labs (blood work) drawn today and your tests are completely normal, you will receive your results only by: [place] (if you have MyChart) OR A paper copy in the mail If you have any lab test that is abnormal or we need to change your treatment, we will call you to review the results.   Testing/Procedures:    Follow-Up: At [facility], you and your health needs are our priority.  As part of our continuing mission to provide you with exceptional heart care, we have created designated Provider Care Teams.  These Care Teams include your primary Cardiologist (physician) and Advanced Practice Providers (APPs -  Physician Assistants and Nurse Practitioners) who all work together to provide you with the care you need, when you need it.  We recommend signing up for the patient portal called "MyChart".  Sign up information is provided on this After Visit Summary.  MyChart is used to connect with patients for Virtual Visits (Telemedicine).  Patients are able to view lab/test results, encounter notes, upcoming appointments, etc.  Non-urgent messages can be sent to your provider as well.   To learn more about what you can do with MyChart, go to [email or web address].    Your next appointment:   4 month(s)  Provider:   Dorris Carnes, MD     Other Instructions  REFERRAL TO DR Gardenia Phlegm

## 2023-01-15 LAB — BASIC METABOLIC PANEL
BUN/Creatinine Ratio: 21 (ref 12–28)
BUN: 24 mg/dL (ref 8–27)
CO2: 27 mmol/L (ref 20–29)
Calcium: 9.4 mg/dL (ref 8.7–10.3)
Chloride: 104 mmol/L (ref 96–106)
Creatinine, Ser: 1.14 mg/dL — ABNORMAL HIGH (ref 0.57–1.00)
Glucose: 94 mg/dL (ref 70–99)
Potassium: 4.9 mmol/L (ref 3.5–5.2)
Sodium: 146 mmol/L — ABNORMAL HIGH (ref 134–144)
eGFR: 50 mL/min/{1.73_m2} — ABNORMAL LOW (ref >59)

## 2023-01-15 LAB — NMR, LIPOPROFILE
Cholesterol, Total: 199 mg/dL (ref 100–199)
HDL Particle Number: 37.7 umol/L (ref >=30.5)
HDL-C: 51 mg/dL (ref >39)
LDL Particle Number: 1449 nmol/L — ABNORMAL HIGH (ref <1000)
LDL Size: 20.9 nm (ref >20.5)
LDL-C (NIH Calc): 112 mg/dL — ABNORMAL HIGH (ref 0–99)
LP-IR Score: 70 — ABNORMAL HIGH (ref <=45)
Small LDL Particle Number: 628 nmol/L — ABNORMAL HIGH (ref <=527)
Triglycerides: 209 mg/dL — ABNORMAL HIGH (ref 0–149)

## 2023-01-15 LAB — VITAMIN D 25 HYDROXY (VIT D DEFICIENCY, FRACTURES): Vit D, 25-Hydroxy: 41 ng/mL (ref 30.0–100.0)

## 2023-01-16 ENCOUNTER — Ambulatory Visit: Payer: 59

## 2023-01-16 DIAGNOSIS — M542 Cervicalgia: Secondary | ICD-10-CM

## 2023-01-16 DIAGNOSIS — G8929 Other chronic pain: Secondary | ICD-10-CM

## 2023-01-16 DIAGNOSIS — R252 Cramp and spasm: Secondary | ICD-10-CM

## 2023-01-16 DIAGNOSIS — R293 Abnormal posture: Secondary | ICD-10-CM | POA: Diagnosis not present

## 2023-01-16 DIAGNOSIS — M5441 Lumbago with sciatica, right side: Secondary | ICD-10-CM | POA: Diagnosis not present

## 2023-01-16 NOTE — Therapy (Signed)
OUTPATIENT PHYSICAL THERAPY TREATMENT   Patient Name: [name] MRN: [id number] DOB:03/10/1946, 77 y.o., female Today's Date: 01/16/2023   PT End of Session - 01/16/23 1530     Visit Number 12    Number of Visits 19    Date for PT Re-Evaluation 01/31/23    Authorization Type [employer] Medicare    Authorization - Visit Number 12    Progress Note Due on Visit 20    PT Start Time 1440    PT Stop Time 1518    PT Time Calculation (min) 38 min    Activity Tolerance Patient tolerated treatment well    Behavior During Therapy [facility] for tasks assessed/performed                   Past Medical History:  Diagnosis Date   Acute MI, subendocardial ([place])    Arthritis    Back pain 05/31/2013   Benign paroxysmal positional vertigo 08/05/2016   CAD (coronary artery disease)    a. NSTEMI 10/2011: [place] 11/19/11: pLAD 30%, oOM 60%, AVCFX 30%, CFX after OM2 30%, pRCA 60 and 70%, then 99%, AM 80-90% with TIMI 3 flow.  PCI: Promus DES x 2 to RCA; b. 06/2012 Cath: patent RCA stents w/ subtl occl of Acute Marginal (jailed)->Med rx; c. 05/2015 MV: EF 59%, mod mid infsept/inf/ap lat/ap infarct with peri-infarct isch-->Med Rx; d. 02/2016 Cath: LM nl, LAD 56m RI 50, RCA patent stents.   Colitis    Facial skin lesion 01/28/2017   GERD (gastroesophageal reflux disease)    occasional   Hyperlipidemia    Hypertension    Hypertensive heart disease    a. Echocardiogram 11/19/11: Difficult acoustic windows, EF 60-65%, normal LV wall thickness, grade 1 diastolic dysfunction   Hypoparathyroidism (HCC)    Low zinc level 11/26/2016   Multinodular thyroid    Goiter s/p thyroidectomy in 2007 with post-op  hypocalcemia and post-op hypothyroidism/hypoparathyroidism with hypocalcemia   Neck pain on right side 07/13/2013   Nocturia 05/31/2013   Osteoarthritis    Pain in right axilla 03/13/2016   Palpitations    a. 03/2012 - patient set up for event monitor but did not wear correctly -  she declined wearing a repeat  monitor   Post-surgical hypothyroidism    Sun-damaged skin 12/05/2014   Tubular adenoma of colon 06/2011   Unspecified constipation 05/31/2013   Vertigo    Zinc deficiency 11/26/2015   Past Surgical History:  Procedure Laterality Date   CARDIAC CATHETERIZATION N/A 03/08/2016   Procedure: Left Heart Cath and Coronary Angiography;  Surgeon: JJettie Booze MD;  [facility];  Service: Cardiovascular;  Laterality: N/A;   COLONOSCOPY  Aenomatous polyps   07/05/2011   COLONOSCOPY N/A 09/28/2014   Procedure: COLONOSCOPY;  Surgeon: MLadene Artist MD;  Location: [facility];  Service: Endoscopy;  Laterality: N/A;   CORONARY ANGIOPLASTY WITH STENT PLACEMENT     LEFT HEART CATH AND CORONARY ANGIOGRAPHY N/A 12/09/2018   Procedure: LEFT HEART CATH AND CORONARY ANGIOGRAPHY;  Surgeon: VJettie Booze MD;  [facility];  Service: Cardiovascular;  Laterality: N/A;   LEFT HEART CATHETERIZATION WITH CORONARY ANGIOGRAM N/A 07/17/2012   Procedure: LEFT HEART CATHETERIZATION WITH CORONARY ANGIOGRAM;  Surgeon: THillary Bow MD;  [facility];  Service: Cardiovascular;  Laterality: N/A;   PERCUTANEOUS CORONARY STENT INTERVENTION (PCI-S) N/A 11/19/2011   Procedure: PERCUTANEOUS CORONARY STENT INTERVENTION (PCI-S);  Surgeon: THillary Bow MD;  [facility];  Service: Cardiovascular;  Laterality: N/A;   POLYPECTOMY     SHOULDER ARTHROSCOPY W/ ROTATOR CUFF REPAIR     right   TOTAL THYROIDECTOMY  2007   GOITER   Patient Active Problem List   Diagnosis Date Noted   Paresthesia 08/28/2022   Chronic neck pain 08/28/2022   Neuropathy 08/21/2022   Vitamin deficiency 10/23/2021   Hypoglycemia 10/07/2019   Heart disease 01/29/2019   Sensorineural hearing loss (SNHL) of both ears 01/27/2019   Primary osteoarthritis of both knees 12/19/2018   DDD (degenerative disc disease), lumbar 12/19/2018   ANA positive 10/27/2018   Anxiety 02/17/2018    Peripheral arterial disease ([place]) 02/20/2017   Skin lesion of face 01/28/2017   Benign paroxysmal positional vertigo 08/05/2016   Antiplatelet or antithrombotic long-term use 03/27/2016   Right lumbar radiculopathy 02/21/2016   Abnormality of gait 02/21/2016   Zinc deficiency 11/26/2015   Right hip pain 10/26/2015   Medicare annual wellness visit, subsequent 03/13/2015   Preventative health care 03/13/2015   Sun-damaged skin 12/05/2014   Angina of effort 12/02/2014   Hx of colonic polyps 09/28/2014   Benign neoplasm of descending colon 09/28/2014   Other fatigue 09/10/2014   Personal history of colonic polyps 07/16/2014   Bilateral thumb pain 03/20/2014   Postsurgical hypoparathyroidism ([place]) 08/03/2013   Neck pain on right side 07/13/2013   Constipation 05/31/2013   Back pain 05/31/2013   Nocturia 05/31/2013   GERD (gastroesophageal reflux disease) 11/02/2012   Tension headache 11/02/2012   Anemia 06/11/2012   Hypokalemia 06/11/2012   Depression 01/21/2012   Headache(784.0) 12/23/2011   Bradycardia 12/07/2011   Hypocalcemia 12/07/2011   Acute MI, subendocardial ([place]) 11/20/2011   Hypothyroidism 05/20/2009   Essential hypertension, benign 05/20/2009   Hyperlipidemia 03/08/2009   CAD, NATIVE VESSEL 03/08/2009    PCP: Mosie Lukes, MD  REFERRING PROVIDER: Glenford Peers, NP  REFERRING DIAG: [phone] (ICD-10-CM) - Other spondylosis with radiculopathy, cervical region  THERAPY DIAG:  Cervicalgia  Chronic right-sided low back pain with right-sided sciatica  Abnormal posture  Cramp and spasm  Rationale for Evaluation and Treatment: Rehabilitation  ONSET DATE: chronic for years  SUBJECTIVE:                                                                                                                                                                                                         SUBJECTIVE STATEMENT: Pt notes increased LBP along R side but also notes  being sedentary for most of the day.  PERTINENT HISTORY:  Surgical hypoparathyrodism, angina (has  nitro), Hypothyroidism, hyperlipidemia, HTN, neuropathy hands and feet, R RTC repair  PAIN:  Are you having pain? Yes: NPRS scale: 7/10 Pain location: R QL Pain description: sometimes sharp shooting, sometimes arms feel weak, constant pain Aggravating factors: moving head overhead, reaching behind her back, turning neck (gets dizzy),  Relieving factors: holding arms close, avoiding movement.   Are you having pain? Yes: NPRS scale: 5/10 today, pain with bending and sleeping on R side Pain location: low back, down R side to mid calf, sometimes numbness in feet with prolonged standing Pain description: pain and numbness Aggravating factors: bending over Relieving factors: laying down, icy/hot and patches, hot showers  PRECAUTIONS: None  WEIGHT BEARING RESTRICTIONS: No  FALLS:  Has patient fallen in last 6 months? No  LIVING ENVIRONMENT: Lives with: lives alone Lives in: House/apartment Stairs: No Has following equipment at home: None  OCCUPATION: retired  PLOF: Independent and Leisure: was going to [employer] but stopped due to pain  PATIENT GOALS: I want to be pain free without medications, know what machines are safe to use  NEXT MD VISIT: 02/02/23 with PCP  OBJECTIVE:   DIAGNOSTIC FINDINGS:  MRI done but not available to review  PATIENT SURVEYS:  NDI 23/50 = 46%  moderate disability   COGNITION: Overall cognitive status: Within functional limits for tasks assessed  SENSATION: Light touch: WFL  POSTURE: rounded shoulders and forward head  PALPATION: Tenderness throughout bil upper traps, levator scapulae, and cervical paraspinals, wincing and jumping away from firm touch.    CERVICAL ROM:   Active ROM AROM (deg) eval AROM 12/19/22  Flexion 15 22  Extension 20 25  Right lateral flexion 10 29  Left lateral flexion 15 32  Right rotation 40 50  Left rotation 40 50    (Blank rows = not tested)  UPPER EXTREMITY MMT:   MMT Right eval Left eval  Shoulder flexion 4+ 4+  Shoulder extension    Shoulder abduction 4+* 4  Shoulder adduction    Shoulder internal rotation 5 5  Shoulder external rotation 5 5  Elbow flexion 5 5  Elbow extension 5 5  Wrist flexion 5 5  Wrist extension 5 5   (Blank rows = not tested)  UPPER EXTREMITY ROM:  MMT Right eval Left eval  Shoulder flexion 115* 120  Shoulder extension 40 65  Shoulder abduction 100 135  Shoulder adduction    Shoulder internal rotation    Shoulder external rotation     (Blank rows = not tested)  LOWER EXTREMITY MMT:    MMT Right eval Left eval  Hip flexion 4 4  Hip extension    Hip abduction 5 5  Hip adduction 4+ 4+  Hip internal rotation    Hip external rotation    Knee flexion    Knee extension 5 5  Ankle dorsiflexion 5 5  Ankle plantarflexion     (Blank rows = not tested)   LUMBAR ROM:   Active  AROM  11/09/2022 AROM 01/01/23  Flexion To mid-thigh Mid shin  Extension WNL, no pain, but increased pain after repeated 5x Limited 50% - pain  Right lateral flexion Inc pain R side back, to knee To knee - increased pain  Left lateral flexion To mid thigh, increased pain R side back To knee - increased pain  Right rotation WNL WNL  Left rotation WNL WNL   (Blank rows = not tested)   CERVICAL SPECIAL TESTS:  Spurling's test: Positive right  FUNCTIONAL TESTS:  5  times sit to stand: 35 seconds Increased sciatic pain with R leg extension in sitting.   5x STS: 31 seconds (01/01/23) TODAY'S TREATMENT:                                                                                                                              DATE: 01/16/23 Therapeutic Exercise: Bike L2x28mn Shoulder ext with pulleys 15# 2x10 Knee flexion 25# x 15 Knee extension 15# x 15  Cybex row 25# 2x10 Farmer carry 5lb unilateral R/L in isometric shoulder press position 90 ft each  Standing L side bend  2x10 Standing doorway R QL stretch 3x15"  Manual Therapy: STM to R QL, lumbar PS  [facility] Adult PT Treatment:                                                DATE: 01/01/23 Therapeutic Exercise: Bike L4 x 5 min warm up [place] press green TB x 10 bilat Row 20# 2 x 10 Lat pull down 20# 2 x 10 Side step green TB around ankles 2 x 10 steps bilat Sit <> stand 5# x 10, 8# x 10 Seated pelvic tilt x 10 Supine ab activation with marching 2 x 10 bilat SLR 2 x 10 bilat   12/19/22 Therapeutic Exercise: to improve strength and mobility.  Demo, verbal and tactile cues throughout for technique. Bike L2x554m Farmer carry 10lb 2x in eahc hand Standing hip hinge x 10 with cane to keep back straight Seated row 20# 2x15 Seated lat pulls 20# 2x15   PATIENT EDUCATION:  Education details: HEP update 11/09/22.  Person educated: Patient Education method: Explanation, Demonstration, Verbal cues, and Handouts Education comprehension: verbalized understanding and returned demonstration  HOME EXERCISE PROGRAM: Access Code: [email or web address] URL: [email or web address] Date: 11/09/2022 Prepared by: ElGlenetta HewExercises - Seated Cervical Retraction  - 3 x daily - 7 x weekly - 1 sets - 10 reps - Seated Scapular Retraction  - 3 x daily - 7 x weekly - 1 sets - 10 reps - Seated Shoulder Rolls  - 3 x daily - 7 x weekly - 1 sets - 10 reps - Cervical Extension AROM with Strap  - 1 x daily - 7 x weekly - 1 sets - 10 reps - Seated Assisted Cervical Rotation with Towel  - 1 x daily - 7 x weekly - 1 sets - 10 reps - Standing Thoracic Open [place] at [place] x daily - 7 x weekly - 1 sets - 10 reps - Supine Posterior Pelvic Tilt  - 1 x daily - 7 x weekly - 2 sets - 10 reps - 3 sec  hold - Supine Transversus Abdominis Bracing with Double Leg Fallout  - 1 x daily - 7 x weekly - 2 sets - 10 reps - Clamshell  -  1 x daily - 7 x weekly - 1 sets - 10 reps  ASSESSMENT:  CLINICAL IMPRESSION: Progressed TE to  continue working on core strengthening and mobility. Pt had c/o R QL pain today, which was addressed with stretching and AROM -- pt noted improvement in pain afterward. She had pain with R side bend so we only did L side today. Educated on ways to keep active during the day to avoid muscle setting and pain.    OBJECTIVE IMPAIRMENTS: decreased activity tolerance, decreased mobility, decreased ROM, decreased strength, dizziness, hypomobility, increased fascial restrictions, impaired perceived functional ability, increased muscle spasms, impaired flexibility, postural dysfunction, and pain.   ACTIVITY LIMITATIONS: carrying, lifting, bending, standing, sleeping, stairs, transfers, dressing, reach over head, and hygiene/grooming  PARTICIPATION LIMITATIONS: meal prep, cleaning, laundry, driving, shopping, and community activity  PERSONAL FACTORS: Age, Time since onset of injury/illness/exacerbation, and 3+ comorbidities: angina, hypothyroidism, neck surgery, neuropathy, R RTC repair, osteoporosis, CAD, history MI, BPPV, chronic low back pain  are also affecting patient's functional outcome.   REHAB POTENTIAL: Good  CLINICAL DECISION MAKING: Evolving/moderate complexity  EVALUATION COMPLEXITY: Moderate   GOALS: Goals reviewed with patient? Yes  SHORT TERM GOALS: Target date: 11/20/2022   Patient will be independent with initial HEP.  Baseline: given Goal status: MET - 11/21/22   LONG TERM GOALS: Target date: 01/31/2023   Patient will be independent with advanced/ongoing HEP to improve outcomes and carryover.  Baseline:  Goal status: IN PROGRESS  2.  Patient will report 75% improvement in neck pain to improve QOL.  Baseline: severe Goal status: IN PROGRESS- 12/19/22 60% improvement  3.  Patient will demonstrate improved cervical ROM by 10 deg all directions for safety with driving.  Baseline: see objective Goal status: IN PROGRESS- 12/19/22  4.  Patient will report 15% improvement on  NDI to demonstrate improved functional ability.  Baseline: 46% Goal status: IN PROGRESS  5.  Patient will report 75% improvement in low back pain to improve quality of life. Baseline:   Goal status: IN PROGRESS- 60-70% improvement 12/5  6. Patient will report 12% improvement on modified Oswestry to demonstrate improved functional ability. Baseline: TBA Goal status: IN PROGRESS - 15/50 30% 12/19/22  7. Patient will will be able to return to community-based exercise program to maintain progress. Baseline: stopped exercising due to pain Goal status: IN PROGRESS    PLAN:  PT FREQUENCY: 1-2x/week  PT DURATION: 4 weeks  PLANNED INTERVENTIONS: Therapeutic exercises, Therapeutic activity, Neuromuscular re-education, Balance training, Gait training, Patient/Family education, Self Care, Joint mobilization, Stair training, Dry Needling, Electrical stimulation, Spinal mobilization, Cryotherapy, Moist heat, Taping, Traction, Ultrasound, Manual therapy, and Re-evaluation  PLAN FOR NEXT SESSION: continue gentle strengthing for core and posture, manual therapy, modalities PRN, will consider dry needling.    Artist Pais, PTA 01/16/2023, 4:45 PM

## 2023-01-17 ENCOUNTER — Telehealth: Payer: Self-pay

## 2023-01-17 ENCOUNTER — Other Ambulatory Visit: Payer: Self-pay

## 2023-01-17 MED ORDER — VITAMIN D 125 MCG (5000 UT) PO CAPS: 5000.0000 [IU] | ORAL_CAPSULE | Freq: Every day | ORAL | 3 refills | Status: DC

## 2023-01-17 NOTE — Telephone Encounter (Signed)
Advised pt her lab results and she says she will hydrate better and tale the increased dose of the Vit D... she says she has been taking the Chol med including Repatha but has not been watching her diet as well... she say she will try to eat better ... I will forward to Dr Harrington Challenger.

## 2023-01-17 NOTE — Telephone Encounter (Signed)
-----  Message from Dorris Carnes V, MD sent at 01/16/2023  1:54 PM EST ----- LDL is very different from May 2023  Confirm taking meds, that she didn't miss some  Cr 1.124   make sure she is staying adequately hydrated  Vit D slightly low   I would increase to 5000 U daiily

## 2023-01-18 NOTE — Telephone Encounter (Signed)
OK,   We will recheck when I see her again

## 2023-01-22 ENCOUNTER — Ambulatory Visit: Payer: 59

## 2023-01-22 DIAGNOSIS — M5441 Lumbago with sciatica, right side: Secondary | ICD-10-CM | POA: Diagnosis not present

## 2023-01-22 DIAGNOSIS — M25572 Pain in left ankle and joints of left foot: Secondary | ICD-10-CM | POA: Diagnosis not present

## 2023-01-22 DIAGNOSIS — R293 Abnormal posture: Secondary | ICD-10-CM

## 2023-01-22 DIAGNOSIS — G8929 Other chronic pain: Secondary | ICD-10-CM

## 2023-01-22 DIAGNOSIS — M542 Cervicalgia: Secondary | ICD-10-CM | POA: Diagnosis not present

## 2023-01-22 DIAGNOSIS — R252 Cramp and spasm: Secondary | ICD-10-CM | POA: Diagnosis not present

## 2023-01-22 NOTE — Therapy (Signed)
OUTPATIENT PHYSICAL THERAPY TREATMENT   Patient Name: JAQUELYNE FIRKUS MRN: [id number] DOB:04/23/1946, 77 y.o., female Today's Date: 01/22/2023   PT End of Session - 01/22/23 1515     Visit Number 13    Number of Visits 19    Date for PT Re-Evaluation 01/31/23    Authorization Type [employer] Medicare    Progress Note Due on Visit 20    PT Start Time 1421    PT Stop Time 1502    PT Time Calculation (min) 41 min    Activity Tolerance Patient tolerated treatment well    Behavior During Therapy [facility] for tasks assessed/performed                    Past Medical History:  Diagnosis Date   Acute MI, subendocardial ([place])    Arthritis    Back pain 05/31/2013   Benign paroxysmal positional vertigo 08/05/2016   CAD (coronary artery disease)    a. NSTEMI 10/2011: [place] 11/19/11: pLAD 30%, oOM 60%, AVCFX 30%, CFX after OM2 30%, pRCA 60 and 70%, then 99%, AM 80-90% with TIMI 3 flow.  PCI: Promus DES x 2 to RCA; b. 06/2012 Cath: patent RCA stents w/ subtl occl of Acute Marginal (jailed)->Med rx; c. 05/2015 MV: EF 59%, mod mid infsept/inf/ap lat/ap infarct with peri-infarct isch-->Med Rx; d. 02/2016 Cath: LM nl, LAD 71m RI 50, RCA patent stents.   Colitis    Facial skin lesion 01/28/2017   GERD (gastroesophageal reflux disease)    occasional   Hyperlipidemia    Hypertension    Hypertensive heart disease    a. Echocardiogram 11/19/11: Difficult acoustic windows, EF 60-65%, normal LV wall thickness, grade 1 diastolic dysfunction   Hypoparathyroidism (HCC)    Low zinc level 11/26/2016   Multinodular thyroid    Goiter s/p thyroidectomy in 2007 with post-op  hypocalcemia and post-op hypothyroidism/hypoparathyroidism with hypocalcemia   Neck pain on right side 07/13/2013   Nocturia 05/31/2013   Osteoarthritis    Pain in right axilla 03/13/2016   Palpitations    a. 03/2012 - patient set up for event monitor but did not wear correctly -  she declined wearing a repeat monitor   Post-surgical hypothyroidism     Sun-damaged skin 12/05/2014   Tubular adenoma of colon 06/2011   Unspecified constipation 05/31/2013   Vertigo    Zinc deficiency 11/26/2015   Past Surgical History:  Procedure Laterality Date   CARDIAC CATHETERIZATION N/A 03/08/2016   Procedure: Left Heart Cath and Coronary Angiography;  Surgeon: JJettie Booze MD;  [facility];  Service: Cardiovascular;  Laterality: N/A;   COLONOSCOPY  Aenomatous polyps   07/05/2011   COLONOSCOPY N/A 09/28/2014   Procedure: COLONOSCOPY;  Surgeon: MLadene Artist MD;  Location: [facility];  Service: Endoscopy;  Laterality: N/A;   CORONARY ANGIOPLASTY WITH STENT PLACEMENT     LEFT HEART CATH AND CORONARY ANGIOGRAPHY N/A 12/09/2018   Procedure: LEFT HEART CATH AND CORONARY ANGIOGRAPHY;  Surgeon: VJettie Booze MD;  [facility];  Service: Cardiovascular;  Laterality: N/A;   LEFT HEART CATHETERIZATION WITH CORONARY ANGIOGRAM N/A 07/17/2012   Procedure: LEFT HEART CATHETERIZATION WITH CORONARY ANGIOGRAM;  Surgeon: THillary Bow MD;  [facility];  Service: Cardiovascular;  Laterality: N/A;   PERCUTANEOUS CORONARY STENT INTERVENTION (PCI-S) N/A 11/19/2011   Procedure: PERCUTANEOUS CORONARY STENT INTERVENTION (PCI-S);  Surgeon: THillary Bow MD;  [facility];  Service: Cardiovascular;  Laterality: N/A;  POLYPECTOMY     SHOULDER ARTHROSCOPY W/ ROTATOR CUFF REPAIR     right   TOTAL THYROIDECTOMY  2007   GOITER   Patient Active Problem List   Diagnosis Date Noted   Paresthesia 08/28/2022   Chronic neck pain 08/28/2022   Neuropathy 08/21/2022   Vitamin deficiency 10/23/2021   Hypoglycemia 10/07/2019   Heart disease 01/29/2019   Sensorineural hearing loss (SNHL) of both ears 01/27/2019   Primary osteoarthritis of both knees 12/19/2018   DDD (degenerative disc disease), lumbar 12/19/2018   ANA positive 10/27/2018   Anxiety 02/17/2018   Peripheral arterial disease ([place]) 02/20/2017    Skin lesion of face 01/28/2017   Benign paroxysmal positional vertigo 08/05/2016   Antiplatelet or antithrombotic long-term use 03/27/2016   Right lumbar radiculopathy 02/21/2016   Abnormality of gait 02/21/2016   Zinc deficiency 11/26/2015   Right hip pain 10/26/2015   Medicare annual wellness visit, subsequent 03/13/2015   Preventative health care 03/13/2015   Sun-damaged skin 12/05/2014   Angina of effort 12/02/2014   Hx of colonic polyps 09/28/2014   Benign neoplasm of descending colon 09/28/2014   Other fatigue 09/10/2014   Personal history of colonic polyps 07/16/2014   Bilateral thumb pain 03/20/2014   Postsurgical hypoparathyroidism ([place]) 08/03/2013   Neck pain on right side 07/13/2013   Constipation 05/31/2013   Back pain 05/31/2013   Nocturia 05/31/2013   GERD (gastroesophageal reflux disease) 11/02/2012   Tension headache 11/02/2012   Anemia 06/11/2012   Hypokalemia 06/11/2012   Depression 01/21/2012   Headache(784.0) 12/23/2011   Bradycardia 12/07/2011   Hypocalcemia 12/07/2011   Acute MI, subendocardial ([place]) 11/20/2011   Hypothyroidism 05/20/2009   Essential hypertension, benign 05/20/2009   Hyperlipidemia 03/08/2009   CAD, NATIVE VESSEL 03/08/2009    PCP: Mosie Lukes, MD  REFERRING PROVIDER: Glenford Peers, NP  REFERRING DIAG: [phone] (ICD-10-CM) - Other spondylosis with radiculopathy, cervical region  THERAPY DIAG:  Cervicalgia  Chronic right-sided low back pain with right-sided sciatica  Abnormal posture  Cramp and spasm  Rationale for Evaluation and Treatment: Rehabilitation  ONSET DATE: chronic for years  SUBJECTIVE:                                                                                                                                                                                                         SUBJECTIVE STATEMENT: Pt reports doing the QL stretch at home, although has not done it today or yesterday.   PERTINENT  HISTORY:  Surgical hypoparathyrodism, angina (has nitro), Hypothyroidism, hyperlipidemia, HTN, neuropathy hands and feet, R  RTC repair  PAIN:  Are you having pain? Yes: NPRS scale: 7/10 Pain location: R QL Pain description: sometimes sharp shooting, sometimes arms feel weak, constant pain Aggravating factors: moving head overhead, reaching behind her back, turning neck (gets dizzy),  Relieving factors: holding arms close, avoiding movement.   Are you having pain? Yes: NPRS scale: 5/10 today, pain with bending and sleeping on R side Pain location: low back, down R side to mid calf, sometimes numbness in feet with prolonged standing Pain description: pain and numbness Aggravating factors: bending over Relieving factors: laying down, icy/hot and patches, hot showers  PRECAUTIONS: None  WEIGHT BEARING RESTRICTIONS: No  FALLS:  Has patient fallen in last 6 months? No  LIVING ENVIRONMENT: Lives with: lives alone Lives in: House/apartment Stairs: No Has following equipment at home: None  OCCUPATION: retired  PLOF: Independent and Leisure: was going to [employer] but stopped due to pain  PATIENT GOALS: I want to be pain free without medications, know what machines are safe to use  NEXT MD VISIT: 02/02/23 with PCP  OBJECTIVE:   DIAGNOSTIC FINDINGS:  MRI done but not available to review  PATIENT SURVEYS:  NDI 23/50 = 46%  moderate disability   COGNITION: Overall cognitive status: Within functional limits for tasks assessed  SENSATION: Light touch: WFL  POSTURE: rounded shoulders and forward head  PALPATION: Tenderness throughout bil upper traps, levator scapulae, and cervical paraspinals, wincing and jumping away from firm touch.    CERVICAL ROM:   Active ROM AROM (deg) eval AROM 12/19/22  Flexion 15 22  Extension 20 25  Right lateral flexion 10 29  Left lateral flexion 15 32  Right rotation 40 50  Left rotation 40 50   (Blank rows = not tested)  UPPER EXTREMITY  MMT:   MMT Right eval Left eval  Shoulder flexion 4+ 4+  Shoulder extension    Shoulder abduction 4+* 4  Shoulder adduction    Shoulder internal rotation 5 5  Shoulder external rotation 5 5  Elbow flexion 5 5  Elbow extension 5 5  Wrist flexion 5 5  Wrist extension 5 5   (Blank rows = not tested)  UPPER EXTREMITY ROM:  MMT Right eval Left eval  Shoulder flexion 115* 120  Shoulder extension 40 65  Shoulder abduction 100 135  Shoulder adduction    Shoulder internal rotation    Shoulder external rotation     (Blank rows = not tested)  LOWER EXTREMITY MMT:    MMT Right eval Left eval  Hip flexion 4 4  Hip extension    Hip abduction 5 5  Hip adduction 4+ 4+  Hip internal rotation    Hip external rotation    Knee flexion    Knee extension 5 5  Ankle dorsiflexion 5 5  Ankle plantarflexion     (Blank rows = not tested)   LUMBAR ROM:   Active  AROM  11/09/2022 AROM 01/01/23  Flexion To mid-thigh Mid shin  Extension WNL, no pain, but increased pain after repeated 5x Limited 50% - pain  Right lateral flexion Inc pain R side back, to knee To knee - increased pain  Left lateral flexion To mid thigh, increased pain R side back To knee - increased pain  Right rotation WNL WNL  Left rotation WNL WNL   (Blank rows = not tested)   CERVICAL SPECIAL TESTS:  Spurling's test: Positive right  FUNCTIONAL TESTS:  5 times sit to stand: 35 seconds Increased sciatic pain  with R leg extension in sitting.   5x STS: 31 seconds (01/01/23) TODAY'S TREATMENT:                                                                                                                              DATE:  01/22/23 Therapeutic Exercise: to improve strength and mobility.  Demo, verbal and tactile cues throughout for technique.  Rec Bike [id number] Standing lat pull 15# 2x10 Seated row 25# 2x15 Farmer carry unilateral in shoulder flexion 5# 1 lap around clinic Knee flexion 25# 2x10 Knee extension 15#  2x10 One leg RDL 2x10 R/L Seated R QL stretch 2x30 sec  01/16/23 Therapeutic Exercise: Bike L2x589m Shoulder ext with pulleys 15# 2x10 Knee flexion 25# x 15 Knee extension 15# x 15  Cybex row 25# 2x10 Farmer carry 5lb unilateral R/L in isometric shoulder press position 90 ft each  Standing L side bend 2x10 Standing doorway R QL stretch 3x15"  Manual Therapy: STM to R QL, lumbar PS  [facility] Adult PT Treatment:                                                DATE: 01/01/23 Therapeutic Exercise: Bike L4 x 5 min warm up [place] press green TB x 10 bilat Row 20# 2 x 10 Lat pull down 20# 2 x 10 Side step green TB around ankles 2 x 10 steps bilat Sit <> stand 5# x 10, 8# x 10 Seated pelvic tilt x 10 Supine ab activation with marching 2 x 10 bilat SLR 2 x 10 bilat   12/19/22 Therapeutic Exercise: to improve strength and mobility.  Demo, verbal and tactile cues throughout for technique. Bike [id number] carry 10lb 2x in eahc hand Standing hip hinge x 10 with cane to keep back straight Seated row 20# 2x15 Seated lat pulls 20# 2x15   PATIENT EDUCATION:  Education details: HEP update 11/09/22.  Person educated: Patient Education method: Explanation, Demonstration, Verbal cues, and Handouts Education comprehension: verbalized understanding and returned demonstration  HOME EXERCISE PROGRAM: Access Code: [email or web address] URL: [email or web address] Date: 11/09/2022 Prepared by: EliGlenetta Hewxercises - Seated Cervical Retraction  - 3 x daily - 7 x weekly - 1 sets - 10 reps - Seated Scapular Retraction  - 3 x daily - 7 x weekly - 1 sets - 10 reps - Seated Shoulder Rolls  - 3 x daily - 7 x weekly - 1 sets - 10 reps - Cervical Extension AROM with Strap  - 1 x daily - 7 x weekly - 1 sets - 10 reps - Seated Assisted Cervical Rotation with Towel  - 1 x daily - 7 x weekly - 1 sets - 10 reps - Standing Thoracic Open Book at [employer] daily - 7 x weekly - 1 sets - 10  reps -  Supine Posterior Pelvic Tilt  - 1 x daily - 7 x weekly - 2 sets - 10 reps - 3 sec  hold - Supine Transversus Abdominis Bracing with Double Leg Fallout  - 1 x daily - 7 x weekly - 2 sets - 10 reps - Clamshell  - 1 x daily - 7 x weekly - 1 sets - 10 reps  ASSESSMENT:  CLINICAL IMPRESSION: Advanced patient through flexibility and strengthening exercises to improve pain and support for lumbar spine and pelvic girdle. Cues provided with exercises as needed. Improved pain noted with one leg RDL and exercises overall. Will continue progressing to improve functional capabilities.    OBJECTIVE IMPAIRMENTS: decreased activity tolerance, decreased mobility, decreased ROM, decreased strength, dizziness, hypomobility, increased fascial restrictions, impaired perceived functional ability, increased muscle spasms, impaired flexibility, postural dysfunction, and pain.   ACTIVITY LIMITATIONS: carrying, lifting, bending, standing, sleeping, stairs, transfers, dressing, reach over head, and hygiene/grooming  PARTICIPATION LIMITATIONS: meal prep, cleaning, laundry, driving, shopping, and community activity  PERSONAL FACTORS: Age, Time since onset of injury/illness/exacerbation, and 3+ comorbidities: angina, hypothyroidism, neck surgery, neuropathy, R RTC repair, osteoporosis, CAD, history MI, BPPV, chronic low back pain  are also affecting patient's functional outcome.   REHAB POTENTIAL: Good  CLINICAL DECISION MAKING: Evolving/moderate complexity  EVALUATION COMPLEXITY: Moderate   GOALS: Goals reviewed with patient? Yes  SHORT TERM GOALS: Target date: 11/20/2022   Patient will be independent with initial HEP.  Baseline: given Goal status: MET - 11/21/22   LONG TERM GOALS: Target date: 01/31/2023   Patient will be independent with advanced/ongoing HEP to improve outcomes and carryover.  Baseline:  Goal status: IN PROGRESS  2.  Patient will report 75% improvement in neck pain to improve QOL.   Baseline: severe Goal status: IN PROGRESS- 12/19/22 60% improvement  3.  Patient will demonstrate improved cervical ROM by 10 deg all directions for safety with driving.  Baseline: see objective Goal status: IN PROGRESS- 12/19/22  4.  Patient will report 15% improvement on NDI to demonstrate improved functional ability.  Baseline: 46% Goal status: IN PROGRESS  5.  Patient will report 75% improvement in low back pain to improve quality of life. Baseline:   Goal status: IN PROGRESS- 60-70% improvement 12/5  6. Patient will report 12% improvement on modified Oswestry to demonstrate improved functional ability. Baseline: TBA Goal status: IN PROGRESS - 15/50 30% 12/19/22  7. Patient will will be able to return to community-based exercise program to maintain progress. Baseline: stopped exercising due to pain Goal status: IN PROGRESS    PLAN:  PT FREQUENCY: 1-2x/week  PT DURATION: 4 weeks  PLANNED INTERVENTIONS: Therapeutic exercises, Therapeutic activity, Neuromuscular re-education, Balance training, Gait training, Patient/Family education, Self Care, Joint mobilization, Stair training, Dry Needling, Electrical stimulation, Spinal mobilization, Cryotherapy, Moist heat, Taping, Traction, Ultrasound, Manual therapy, and Re-evaluation  PLAN FOR NEXT SESSION: continue gentle strengthing for core and posture, manual therapy, modalities PRN, will consider dry needling.    Artist Pais, PTA 01/22/2023, 3:20 PM

## 2023-01-23 ENCOUNTER — Ambulatory Visit: Payer: 59

## 2023-01-23 DIAGNOSIS — M542 Cervicalgia: Secondary | ICD-10-CM

## 2023-01-23 DIAGNOSIS — R252 Cramp and spasm: Secondary | ICD-10-CM

## 2023-01-23 DIAGNOSIS — G8929 Other chronic pain: Secondary | ICD-10-CM | POA: Diagnosis not present

## 2023-01-23 DIAGNOSIS — R293 Abnormal posture: Secondary | ICD-10-CM | POA: Diagnosis not present

## 2023-01-23 DIAGNOSIS — M5441 Lumbago with sciatica, right side: Secondary | ICD-10-CM | POA: Diagnosis not present

## 2023-01-23 NOTE — Therapy (Signed)
OUTPATIENT PHYSICAL THERAPY TREATMENT   Patient Name: [name] MRN: [id number] DOB:[birth year], 77 y.o., female Today's Date: 01/23/2023   PT End of Session - 01/23/23 1058     Visit Number 14    Number of Visits 19    Date for PT Re-Evaluation 01/31/23    Authorization Type [employer] Medicare    Progress Note Due on Visit 20    PT Start Time 1017    PT Stop Time 1057    PT Time Calculation (min) 40 min    Activity Tolerance Patient tolerated treatment well    Behavior During Therapy [facility] for tasks assessed/performed                     Past Medical History:  Diagnosis Date   Acute MI, subendocardial ([place])    Arthritis    Back pain 05/31/2013   Benign paroxysmal positional vertigo 08/05/2016   CAD (coronary artery disease)    a. NSTEMI 10/2011: [place] 11/19/11: pLAD 30%, oOM 60%, AVCFX 30%, CFX after OM2 30%, pRCA 60 and 70%, then 99%, AM 80-90% with TIMI 3 flow.  PCI: Promus DES x 2 to RCA; b. 06/2012 Cath: patent RCA stents w/ subtl occl of Acute Marginal (jailed)->Med rx; c. 05/2015 MV: EF 59%, mod mid infsept/inf/ap lat/ap infarct with peri-infarct isch-->Med Rx; d. 02/2016 Cath: LM nl, LAD 11m RI 50, RCA patent stents.   Colitis    Facial skin lesion 01/28/2017   GERD (gastroesophageal reflux disease)    occasional   Hyperlipidemia    Hypertension    Hypertensive heart disease    a. Echocardiogram 11/19/11: Difficult acoustic windows, EF 60-65%, normal LV wall thickness, grade 1 diastolic dysfunction   Hypoparathyroidism (HCC)    Low zinc level 11/26/2016   Multinodular thyroid    Goiter s/p thyroidectomy in 2007 with post-op  hypocalcemia and post-op hypothyroidism/hypoparathyroidism with hypocalcemia   Neck pain on right side 07/13/2013   Nocturia 05/31/2013   Osteoarthritis    Pain in right axilla 03/13/2016   Palpitations    a. 03/2012 - patient set up for event monitor but did not wear correctly -  she declined wearing a repeat monitor   Post-surgical  hypothyroidism    Sun-damaged skin 12/05/2014   Tubular adenoma of colon 06/2011   Unspecified constipation 05/31/2013   Vertigo    Zinc deficiency 11/26/2015   Past Surgical History:  Procedure Laterality Date   CARDIAC CATHETERIZATION N/A 03/08/2016   Procedure: Left Heart Cath and Coronary Angiography;  Surgeon: JJettie Booze MD;  [facility];  Service: Cardiovascular;  Laterality: N/A;   COLONOSCOPY  Aenomatous polyps   07/05/2011   COLONOSCOPY N/A 09/28/2014   Procedure: COLONOSCOPY;  Surgeon: MLadene Artist MD;  Location: [facility];  Service: Endoscopy;  Laterality: N/A;   CORONARY ANGIOPLASTY WITH STENT PLACEMENT     LEFT HEART CATH AND CORONARY ANGIOGRAPHY N/A 12/09/2018   Procedure: LEFT HEART CATH AND CORONARY ANGIOGRAPHY;  Surgeon: VJettie Booze MD;  [facility];  Service: Cardiovascular;  Laterality: N/A;   LEFT HEART CATHETERIZATION WITH CORONARY ANGIOGRAM N/A 07/17/2012   Procedure: LEFT HEART CATHETERIZATION WITH CORONARY ANGIOGRAM;  Surgeon: THillary Bow MD;  [facility];  Service: Cardiovascular;  Laterality: N/A;   PERCUTANEOUS CORONARY STENT INTERVENTION (PCI-S) N/A 11/19/2011   Procedure: PERCUTANEOUS CORONARY STENT INTERVENTION (PCI-S);  Surgeon: THillary Bow MD;  [facility];  Service: Cardiovascular;  Laterality:  N/A;   POLYPECTOMY     SHOULDER ARTHROSCOPY W/ ROTATOR CUFF REPAIR     right   TOTAL THYROIDECTOMY  2007   GOITER   Patient Active Problem List   Diagnosis Date Noted   Paresthesia 08/28/2022   Chronic neck pain 08/28/2022   Neuropathy 08/21/2022   Vitamin deficiency 10/23/2021   Hypoglycemia 10/07/2019   Heart disease 01/29/2019   Sensorineural hearing loss (SNHL) of both ears 01/27/2019   Primary osteoarthritis of both knees 12/19/2018   DDD (degenerative disc disease), lumbar 12/19/2018   ANA positive 10/27/2018   Anxiety 02/17/2018   Peripheral arterial disease  ([place]) 02/20/2017   Skin lesion of face 01/28/2017   Benign paroxysmal positional vertigo 08/05/2016   Antiplatelet or antithrombotic long-term use 03/27/2016   Right lumbar radiculopathy 02/21/2016   Abnormality of gait 02/21/2016   Zinc deficiency 11/26/2015   Right hip pain 10/26/2015   Medicare annual wellness visit, subsequent 03/13/2015   Preventative health care 03/13/2015   Sun-damaged skin 12/05/2014   Angina of effort 12/02/2014   Hx of colonic polyps 09/28/2014   Benign neoplasm of descending colon 09/28/2014   Other fatigue 09/10/2014   Personal history of colonic polyps 07/16/2014   Bilateral thumb pain 03/20/2014   Postsurgical hypoparathyroidism ([place]) 08/03/2013   Neck pain on right side 07/13/2013   Constipation 05/31/2013   Back pain 05/31/2013   Nocturia 05/31/2013   GERD (gastroesophageal reflux disease) 11/02/2012   Tension headache 11/02/2012   Anemia 06/11/2012   Hypokalemia 06/11/2012   Depression 01/21/2012   Headache(784.0) 12/23/2011   Bradycardia 12/07/2011   Hypocalcemia 12/07/2011   Acute MI, subendocardial ([place]) 11/20/2011   Hypothyroidism 05/20/2009   Essential hypertension, benign 05/20/2009   Hyperlipidemia 03/08/2009   CAD, NATIVE VESSEL 03/08/2009    PCP: Mosie Lukes, MD  REFERRING PROVIDER: Glenford Peers, NP  REFERRING DIAG: [id number] (ICD-10-CM) - Other spondylosis with radiculopathy, cervical region  THERAPY DIAG:  Cervicalgia  Chronic right-sided low back pain with right-sided sciatica  Abnormal posture  Cramp and spasm  Rationale for Evaluation and Treatment: Rehabilitation  ONSET DATE: chronic for years  SUBJECTIVE:                                                                                                                                                                                                         SUBJECTIVE STATEMENT: Pt reports improved back pain this morning, she even went walking after  yesterday's session.  PERTINENT HISTORY:  Surgical hypoparathyrodism, angina (has nitro), Hypothyroidism, hyperlipidemia, HTN, neuropathy hands and feet, R  RTC repair  PAIN:  Are you having pain? Yes: NPRS scale: 3/10 Pain location: R QL Pain description: sometimes sharp shooting, sometimes arms feel weak, constant pain Aggravating factors: moving head overhead, reaching behind her back, turning neck (gets dizzy),  Relieving factors: holding arms close, avoiding movement.   Are you having pain? Yes: NPRS scale: 5/10 today, pain with bending and sleeping on R side Pain location: low back, down R side to mid calf, sometimes numbness in feet with prolonged standing Pain description: pain and numbness Aggravating factors: bending over Relieving factors: laying down, icy/hot and patches, hot showers  PRECAUTIONS: None  WEIGHT BEARING RESTRICTIONS: No  FALLS:  Has patient fallen in last 6 months? No  LIVING ENVIRONMENT: Lives with: lives alone Lives in: House/apartment Stairs: No Has following equipment at home: None  OCCUPATION: retired  PLOF: Independent and Leisure: was going to [employer] but stopped due to pain  PATIENT GOALS: I want to be pain free without medications, know what machines are safe to use  NEXT MD VISIT: 02/02/23 with PCP  OBJECTIVE:   DIAGNOSTIC FINDINGS:  MRI done but not available to review  PATIENT SURVEYS:  NDI 23/50 = 46%  moderate disability   COGNITION: Overall cognitive status: Within functional limits for tasks assessed  SENSATION: Light touch: WFL  POSTURE: rounded shoulders and forward head  PALPATION: Tenderness throughout bil upper traps, levator scapulae, and cervical paraspinals, wincing and jumping away from firm touch.    CERVICAL ROM:   Active ROM AROM (deg) eval AROM 12/19/22 AROM 01/23/23  Flexion 15 22 34  Extension '20 25 24  '$ Right lateral flexion 10 29   Left lateral flexion 15 32   Right rotation 40 50 45  Left  rotation 40 50 57   (Blank rows = not tested)  UPPER EXTREMITY MMT:   MMT Right eval Left eval  Shoulder flexion 4+ 4+  Shoulder extension    Shoulder abduction 4+* 4  Shoulder adduction    Shoulder internal rotation 5 5  Shoulder external rotation 5 5  Elbow flexion 5 5  Elbow extension 5 5  Wrist flexion 5 5  Wrist extension 5 5   (Blank rows = not tested)  UPPER EXTREMITY ROM:  MMT Right eval Left eval  Shoulder flexion 115* 120  Shoulder extension 40 65  Shoulder abduction 100 135  Shoulder adduction    Shoulder internal rotation    Shoulder external rotation     (Blank rows = not tested)  LOWER EXTREMITY MMT:    MMT Right eval Left eval  Hip flexion 4 4  Hip extension    Hip abduction 5 5  Hip adduction 4+ 4+  Hip internal rotation    Hip external rotation    Knee flexion    Knee extension 5 5  Ankle dorsiflexion 5 5  Ankle plantarflexion     (Blank rows = not tested)   LUMBAR ROM:   Active  AROM  11/09/2022 AROM 01/01/23  Flexion To mid-thigh Mid shin  Extension WNL, no pain, but increased pain after repeated 5x Limited 50% - pain  Right lateral flexion Inc pain R side back, to knee To knee - increased pain  Left lateral flexion To mid thigh, increased pain R side back To knee - increased pain  Right rotation WNL WNL  Left rotation WNL WNL   (Blank rows = not tested)   CERVICAL SPECIAL TESTS:  Spurling's test: Positive right  FUNCTIONAL TESTS:  5 times  sit to stand: 35 seconds Increased sciatic pain with R leg extension in sitting.   5x STS: 31 seconds (01/01/23) TODAY'S TREATMENT:                                                                                                                              DATE: 01/23/23 Therapeutic Exercise: to improve strength and mobility.  Demo, verbal and tactile cues throughout for technique.  Rec Bike L2x30mn Standing shoulder ext 10#, 15# x 10 each Standing lat pull 20# 2x10 Seated row 25# 3x10 RDL  one leg 2x10 bil Seated QL stretch with 3x30 sec  01/22/23 Therapeutic Exercise: to improve strength and mobility.  Demo, verbal and tactile cues throughout for technique.  Rec Bike L2x521m Standing lat pull 15# 2x10 Seated row 25# 2x15 Farmer carry unilateral in shoulder flexion 5# 1 lap around clinic Knee flexion 25# 2x10 Knee extension 15# 2x10 One leg RDL 2x10 R/L Seated R QL stretch 2x30 sec  01/16/23 Therapeutic Exercise: Bike [id number] ext with pulleys 15# 2x10 Knee flexion 25# x 15 Knee extension 15# x 15  Cybex row 25# 2x10 Farmer carry 5lb unilateral R/L in isometric shoulder press position 90 ft each  Standing L side bend 2x10 Standing doorway R QL stretch 3x15"  Manual Therapy: STM to R QL, lumbar PS  [facility] Adult PT Treatment:                                                DATE: 01/01/23 Therapeutic Exercise: Bike L4 x 5 min warm up [place] press green TB x 10 bilat Row 20# 2 x 10 Lat pull down 20# 2 x 10 Side step green TB around ankles 2 x 10 steps bilat Sit <> stand 5# x 10, 8# x 10 Seated pelvic tilt x 10 Supine ab activation with marching 2 x 10 bilat SLR 2 x 10 bilat   12/19/22 Therapeutic Exercise: to improve strength and mobility.  Demo, verbal and tactile cues throughout for technique. Bike [id number] carry 10lb 2x in eahc hand Standing hip hinge x 10 with cane to keep back straight Seated row 20# 2x15 Seated lat pulls 20# 2x15   PATIENT EDUCATION:  Education details: HEP update 11/09/22.  Person educated: Patient Education method: Explanation, Demonstration, Verbal cues, and Handouts Education comprehension: verbalized understanding and returned demonstration  HOME EXERCISE PROGRAM: Access Code: [email or web address] URL: [email or web address] Date: 11/09/2022 Prepared by: ElizGlenetta Hewercises - Seated Cervical Retraction  - 3 x daily - 7 x weekly - 1 sets - 10 reps - Seated Scapular Retraction  - 3 x daily - 7 x  weekly - 1 sets - 10 reps - Seated Shoulder Rolls  - 3 x daily - 7 x weekly - 1 sets - 10 reps - Cervical Extension AROM with Strap  -  1 x daily - 7 x weekly - 1 sets - 10 reps - Seated Assisted Cervical Rotation with Towel  - 1 x daily - 7 x weekly - 1 sets - 10 reps - Standing Thoracic Open Book at [place]  - 1 x daily - 7 x weekly - 1 sets - 10 reps - Supine Posterior Pelvic Tilt  - 1 x daily - 7 x weekly - 2 sets - 10 reps - 3 sec  hold - Supine Transversus Abdominis Bracing with Double Leg Fallout  - 1 x daily - 7 x weekly - 2 sets - 10 reps - Clamshell  - 1 x daily - 7 x weekly - 1 sets - 10 reps  ASSESSMENT:  CLINICAL IMPRESSION: Advanced patient through flexibility and strengthening exercises to improve support for lumbar spine and pelvic girdle. Pt w/o any complaints of pain throughout session. She shows improved cervical ROM with no pain in any directions but more stiff with R rotation. She continues to respond well to the general exercise approach.    OBJECTIVE IMPAIRMENTS: decreased activity tolerance, decreased mobility, decreased ROM, decreased strength, dizziness, hypomobility, increased fascial restrictions, impaired perceived functional ability, increased muscle spasms, impaired flexibility, postural dysfunction, and pain.   ACTIVITY LIMITATIONS: carrying, lifting, bending, standing, sleeping, stairs, transfers, dressing, reach over head, and hygiene/grooming  PARTICIPATION LIMITATIONS: meal prep, cleaning, laundry, driving, shopping, and community activity  PERSONAL FACTORS: Age, Time since onset of injury/illness/exacerbation, and 3+ comorbidities: angina, hypothyroidism, neck surgery, neuropathy, R RTC repair, osteoporosis, CAD, history MI, BPPV, chronic low back pain  are also affecting patient's functional outcome.   REHAB POTENTIAL: Good  CLINICAL DECISION MAKING: Evolving/moderate complexity  EVALUATION COMPLEXITY: Moderate   GOALS: Goals reviewed with patient?  Yes  SHORT TERM GOALS: Target date: 11/20/2022   Patient will be independent with initial HEP.  Baseline: given Goal status: MET - 11/21/22   LONG TERM GOALS: Target date: 01/31/2023   Patient will be independent with advanced/ongoing HEP to improve outcomes and carryover.  Baseline:  Goal status: IN PROGRESS  2.  Patient will report 75% improvement in neck pain to improve QOL.  Baseline: severe Goal status: IN PROGRESS- 12/19/22 65% improvement  3.  Patient will demonstrate improved cervical ROM by 10 deg all directions for safety with driving.  Baseline: see objective Goal status: IN PROGRESS- 12/19/22  4.  Patient will report 15% improvement on NDI to demonstrate improved functional ability.  Baseline: 46% Goal status: IN PROGRESS  5.  Patient will report 75% improvement in low back pain to improve quality of life. Baseline:   Goal status: IN PROGRESS- 60-70% improvement 12/5  6. Patient will report 12% improvement on modified Oswestry to demonstrate improved functional ability. Baseline: TBA Goal status: IN PROGRESS - 15/50 30% 12/19/22  7. Patient will will be able to return to community-based exercise program to maintain progress. Baseline: stopped exercising due to pain Goal status: IN PROGRESS    PLAN:  PT FREQUENCY: 1-2x/week  PT DURATION: 4 weeks  PLANNED INTERVENTIONS: Therapeutic exercises, Therapeutic activity, Neuromuscular re-education, Balance training, Gait training, Patient/Family education, Self Care, Joint mobilization, Stair training, Dry Needling, Electrical stimulation, Spinal mobilization, Cryotherapy, Moist heat, Taping, Traction, Ultrasound, Manual therapy, and Re-evaluation  PLAN FOR NEXT SESSION: continue gentle strengthing for core and posture, manual therapy, modalities PRN, will consider dry needling.    Artist Pais, PTA 01/23/2023, 10:59 AM

## 2023-01-24 NOTE — Therapy (Signed)
OUTPATIENT PHYSICAL THERAPY TREATMENT   Patient Name: BREIANA STRATMANN MRN: [id number] DOB:07-29-1946, 77 y.o., female Today's Date: 01/28/2023   PT End of Session - 01/28/23 1350     Visit Number 15    Number of Visits 19    Date for PT Re-Evaluation 01/31/23    Authorization Type [employer] Medicare    Progress Note Due on Visit 20    PT Start Time 1348    PT Stop Time 1416    PT Time Calculation (min) 28 min    Activity Tolerance Patient tolerated treatment well    Behavior During Therapy [facility] for tasks assessed/performed                      Past Medical History:  Diagnosis Date   Acute MI, subendocardial ([place])    Arthritis    Back pain 05/31/2013   Benign paroxysmal positional vertigo 08/05/2016   CAD (coronary artery disease)    a. NSTEMI 10/2011: [place] 11/19/11: pLAD 30%, oOM 60%, AVCFX 30%, CFX after OM2 30%, pRCA 60 and 70%, then 99%, AM 80-90% with TIMI 3 flow.  PCI: Promus DES x 2 to RCA; b. 06/2012 Cath: patent RCA stents w/ subtl occl of Acute Marginal (jailed)->Med rx; c. 05/2015 MV: EF 59%, mod mid infsept/inf/ap lat/ap infarct with peri-infarct isch-->Med Rx; d. 02/2016 Cath: LM nl, LAD 20m RI 50, RCA patent stents.   Colitis    Facial skin lesion 01/28/2017   GERD (gastroesophageal reflux disease)    occasional   Hyperlipidemia    Hypertension    Hypertensive heart disease    a. Echocardiogram 11/19/11: Difficult acoustic windows, EF 60-65%, normal LV wall thickness, grade 1 diastolic dysfunction   Hypoparathyroidism (HCC)    Low zinc level 11/26/2016   Multinodular thyroid    Goiter s/p thyroidectomy in 2007 with post-op  hypocalcemia and post-op hypothyroidism/hypoparathyroidism with hypocalcemia   Neck pain on right side 07/13/2013   Nocturia 05/31/2013   Osteoarthritis    Pain in right axilla 03/13/2016   Palpitations    a. 03/2012 - patient set up for event monitor but did not wear correctly -  she declined wearing a repeat monitor   Post-surgical  hypothyroidism    Sun-damaged skin 12/05/2014   Tubular adenoma of colon 06/2011   Unspecified constipation 05/31/2013   Vertigo    Zinc deficiency 11/26/2015   Past Surgical History:  Procedure Laterality Date   CARDIAC CATHETERIZATION N/A 03/08/2016   Procedure: Left Heart Cath and Coronary Angiography;  Surgeon: JJettie Booze MD;  [facility];  Service: Cardiovascular;  Laterality: N/A;   COLONOSCOPY  Aenomatous polyps   07/05/2011   COLONOSCOPY N/A 09/28/2014   Procedure: COLONOSCOPY;  Surgeon: MLadene Artist MD;  Location: [facility];  Service: Endoscopy;  Laterality: N/A;   CORONARY ANGIOPLASTY WITH STENT PLACEMENT     LEFT HEART CATH AND CORONARY ANGIOGRAPHY N/A 12/09/2018   Procedure: LEFT HEART CATH AND CORONARY ANGIOGRAPHY;  Surgeon: VJettie Booze MD;  [facility];  Service: Cardiovascular;  Laterality: N/A;   LEFT HEART CATHETERIZATION WITH CORONARY ANGIOGRAM N/A 07/17/2012   Procedure: LEFT HEART CATHETERIZATION WITH CORONARY ANGIOGRAM;  Surgeon: THillary Bow MD;  [facility];  Service: Cardiovascular;  Laterality: N/A;   PERCUTANEOUS CORONARY STENT INTERVENTION (PCI-S) N/A 11/19/2011   Procedure: PERCUTANEOUS CORONARY STENT INTERVENTION (PCI-S);  Surgeon: THillary Bow MD;  [facility];  Service: Cardiovascular;  Laterality: N/A;   POLYPECTOMY     SHOULDER ARTHROSCOPY W/ ROTATOR CUFF REPAIR     right   TOTAL THYROIDECTOMY  2007   GOITER   Patient Active Problem List   Diagnosis Date Noted   Paresthesia 08/28/2022   Chronic neck pain 08/28/2022   Neuropathy 08/21/2022   Vitamin deficiency 10/23/2021   Hypoglycemia 10/07/2019   Heart disease 01/29/2019   Sensorineural hearing loss (SNHL) of both ears 01/27/2019   Primary osteoarthritis of both knees 12/19/2018   DDD (degenerative disc disease), lumbar 12/19/2018   ANA positive 10/27/2018   Anxiety 02/17/2018   Peripheral arterial disease  ([place]) 02/20/2017   Skin lesion of face 01/28/2017   Benign paroxysmal positional vertigo 08/05/2016   Antiplatelet or antithrombotic long-term use 03/27/2016   Right lumbar radiculopathy 02/21/2016   Abnormality of gait 02/21/2016   Zinc deficiency 11/26/2015   Right hip pain 10/26/2015   Medicare annual wellness visit, subsequent 03/13/2015   Preventative health care 03/13/2015   Sun-damaged skin 12/05/2014   Angina of effort 12/02/2014   Hx of colonic polyps 09/28/2014   Benign neoplasm of descending colon 09/28/2014   Other fatigue 09/10/2014   Personal history of colonic polyps 07/16/2014   Bilateral thumb pain 03/20/2014   Postsurgical hypoparathyroidism ([place]) 08/03/2013   Neck pain on right side 07/13/2013   Constipation 05/31/2013   Back pain 05/31/2013   Nocturia 05/31/2013   GERD (gastroesophageal reflux disease) 11/02/2012   Tension headache 11/02/2012   Anemia 06/11/2012   Hypokalemia 06/11/2012   Depression 01/21/2012   Headache(784.0) 12/23/2011   Bradycardia 12/07/2011   Hypocalcemia 12/07/2011   Acute MI, subendocardial ([place]) 11/20/2011   Hypothyroidism 05/20/2009   Essential hypertension, benign 05/20/2009   Hyperlipidemia 03/08/2009   CAD, NATIVE VESSEL 03/08/2009    PCP: Mosie Lukes, MD  REFERRING PROVIDER: Glenford Peers, NP  REFERRING DIAG: [phone] (ICD-10-CM) - Other spondylosis with radiculopathy, cervical region  THERAPY DIAG:  Cervicalgia  Chronic right-sided low back pain with right-sided sciatica  Abnormal posture  Cramp and spasm  Rationale for Evaluation and Treatment: Rehabilitation  ONSET DATE: chronic for years  SUBJECTIVE:                                                                                                                                                                                                         SUBJECTIVE STATEMENT: Mersadez reports she has been in severe R shoulder pain since Saturday morning,  unknown MOI. She had injection today.  PERTINENT HISTORY:  Surgical hypoparathyrodism, angina (has nitro), Hypothyroidism, hyperlipidemia,  HTN, neuropathy hands and feet, R RTC repair  PAIN:  Are you having pain? Yes: NPRS scale: 20/10 Pain location: R shoulder Pain description: sometimes sharp shooting, sometimes arms feel weak, constant pain Aggravating factors: moving head overhead, reaching behind her back, turning neck (gets dizzy),  Relieving factors: holding arms close, avoiding movement.   Are you having pain? Yes: NPRS scale: 5/10 today, pain with bending and sleeping on R side Pain location: low back, down R side to mid calf, sometimes numbness in feet with prolonged standing Pain description: pain and numbness Aggravating factors: bending over Relieving factors: laying down, icy/hot and patches, hot showers  PRECAUTIONS: None  WEIGHT BEARING RESTRICTIONS: No  FALLS:  Has patient fallen in last 6 months? No  LIVING ENVIRONMENT: Lives with: lives alone Lives in: House/apartment Stairs: No Has following equipment at home: None  OCCUPATION: retired  PLOF: Independent and Leisure: was going to [employer] but stopped due to pain  PATIENT GOALS: I want to be pain free without medications, know what machines are safe to use  NEXT MD VISIT: 02/02/23 with PCP  OBJECTIVE:   DIAGNOSTIC FINDINGS:  MRI done but not available to review  PATIENT SURVEYS:  NDI 23/50 = 46%  moderate disability   COGNITION: Overall cognitive status: Within functional limits for tasks assessed  SENSATION: Light touch: WFL  POSTURE: rounded shoulders and forward head  PALPATION: Tenderness throughout bil upper traps, levator scapulae, and cervical paraspinals, wincing and jumping away from firm touch.    CERVICAL ROM:   Active ROM AROM (deg) eval AROM 12/19/22 AROM 01/23/23  Flexion 15 22 34  Extension '20 25 24  '$ Right lateral flexion 10 29   Left lateral flexion 15 32   Right  rotation 40 50 45  Left rotation 40 50 57   (Blank rows = not tested)  UPPER EXTREMITY MMT:   MMT Right eval Left eval  Shoulder flexion 4+ 4+  Shoulder extension    Shoulder abduction 4+* 4  Shoulder adduction    Shoulder internal rotation 5 5  Shoulder external rotation 5 5  Elbow flexion 5 5  Elbow extension 5 5  Wrist flexion 5 5  Wrist extension 5 5   (Blank rows = not tested)  UPPER EXTREMITY ROM:  MMT Right eval Left eval  Shoulder flexion 115* 120  Shoulder extension 40 65  Shoulder abduction 100 135  Shoulder adduction    Shoulder internal rotation    Shoulder external rotation     (Blank rows = not tested)  LOWER EXTREMITY MMT:    MMT Right eval Left eval  Hip flexion 4 4  Hip extension    Hip abduction 5 5  Hip adduction 4+ 4+  Hip internal rotation    Hip external rotation    Knee flexion    Knee extension 5 5  Ankle dorsiflexion 5 5  Ankle plantarflexion     (Blank rows = not tested)   LUMBAR ROM:   Active  AROM  11/09/2022 AROM 01/01/23  Flexion To mid-thigh Mid shin  Extension WNL, no pain, but increased pain after repeated 5x Limited 50% - pain  Right lateral flexion Inc pain R side back, to knee To knee - increased pain  Left lateral flexion To mid thigh, increased pain R side back To knee - increased pain  Right rotation WNL WNL  Left rotation WNL WNL   (Blank rows = not tested)   CERVICAL SPECIAL TESTS:  Spurling's test: Positive right  FUNCTIONAL TESTS:  5 times sit to stand: 35 seconds Increased sciatic pain with R leg extension in sitting.   5x STS: 31 seconds (01/01/23) TODAY'S TREATMENT:                                                                                                                              DATE:  01/28/23 Modalities:  Estim IFC x 15 min to pt tolerance to R shoulder to decrease pain and spasm  Self Care: discussed holding off exercises for a few days post injection, advised positioning, focused  relaxation to decrease tension in R UT Pendulums for pain relief   01/23/23 Therapeutic Exercise: to improve strength and mobility.  Demo, verbal and tactile cues throughout for technique.  Rec Bike [id number] Standing shoulder ext 10#, 15# x 10 each Standing lat pull 20# 2x10 Seated row 25# 3x10 RDL one leg 2x10 bil Seated QL stretch with 3x30 sec  01/22/23 Therapeutic Exercise: to improve strength and mobility.  Demo, verbal and tactile cues throughout for technique.  Rec Bike [id number] Standing lat pull 15# 2x10 Seated row 25# 2x15 Farmer carry unilateral in shoulder flexion 5# 1 lap around clinic Knee flexion 25# 2x10 Knee extension 15# 2x10 One leg RDL 2x10 R/L Seated R QL stretch 2x30 sec  PATIENT EDUCATION:  Education details: HEP update 11/09/22.  Person educated: Patient Education method: Explanation, Demonstration, Verbal cues, and Handouts Education comprehension: verbalized understanding and returned demonstration  HOME EXERCISE PROGRAM: Access Code: [email or web address] URL: [email or web address] Date: 11/09/2022 Prepared by: ElGlenetta HewExercises - Seated Cervical Retraction  - 3 x daily - 7 x weekly - 1 sets - 10 reps - Seated Scapular Retraction  - 3 x daily - 7 x weekly - 1 sets - 10 reps - Seated Shoulder Rolls  - 3 x daily - 7 x weekly - 1 sets - 10 reps - Cervical Extension AROM with Strap  - 1 x daily - 7 x weekly - 1 sets - 10 reps - Seated Assisted Cervical Rotation with Towel  - 1 x daily - 7 x weekly - 1 sets - 10 reps - Standing Thoracic Open Book at [place]- 1 x daily - 7 x weekly - 1 sets - 10 reps - Supine Posterior Pelvic Tilt  - 1 x daily - 7 x weekly - 2 sets - 10 reps - 3 sec  hold - Supine Transversus Abdominis Bracing with Double Leg Fallout  - 1 x daily - 7 x weekly - 2 sets - 10 reps - Clamshell  - 1 x daily - 7 x weekly - 1 sets - 10 reps  ASSESSMENT:  CLINICAL IMPRESSION: Patient presents with marked R shoulder pain today since  Saturday morning. She had an injection at [facility] around noon today, but pain is still 20/10. Trial of estim and moist heat in reclined position with towels for support under arm and behind shoulder. She had decreased tension  in R UT at end of session and reported mild decrease in pain. Advised rest, ice and/or heat at home. Pt's POC is up 01/31/23. She was unable to tolerate goal assessment today.     OBJECTIVE IMPAIRMENTS: decreased activity tolerance, decreased mobility, decreased ROM, decreased strength, dizziness, hypomobility, increased fascial restrictions, impaired perceived functional ability, increased muscle spasms, impaired flexibility, postural dysfunction, and pain.   ACTIVITY LIMITATIONS: carrying, lifting, bending, standing, sleeping, stairs, transfers, dressing, reach over head, and hygiene/grooming  PARTICIPATION LIMITATIONS: meal prep, cleaning, laundry, driving, shopping, and community activity  PERSONAL FACTORS: Age, Time since onset of injury/illness/exacerbation, and 3+ comorbidities: angina, hypothyroidism, neck surgery, neuropathy, R RTC repair, osteoporosis, CAD, history MI, BPPV, chronic low back pain  are also affecting patient's functional outcome.   REHAB POTENTIAL: Good  CLINICAL DECISION MAKING: Evolving/moderate complexity  EVALUATION COMPLEXITY: Moderate   GOALS: Goals reviewed with patient? Yes  SHORT TERM GOALS: Target date: 11/20/2022   Patient will be independent with initial HEP.  Baseline: given Goal status: MET - 11/21/22   LONG TERM GOALS: Target date: 01/31/2023   Patient will be independent with advanced/ongoing HEP to improve outcomes and carryover.  Baseline:  Goal status: IN PROGRESS  2.  Patient will report 75% improvement in neck pain to improve QOL.  Baseline: severe Goal status: IN PROGRESS- 12/19/22 65% improvement  3.  Patient will demonstrate improved cervical ROM by 10 deg all directions for safety with driving.  Baseline: see  objective Goal status: IN PROGRESS- 12/19/22  4.  Patient will report 15% improvement on NDI to demonstrate improved functional ability.  Baseline: 46% Goal status: IN PROGRESS  5.  Patient will report 75% improvement in low back pain to improve quality of life. Baseline:   Goal status: IN PROGRESS- 60-70% improvement 12/5  6. Patient will report 12% improvement on modified Oswestry to demonstrate improved functional ability. Baseline: TBA Goal status: IN PROGRESS - 15/50 30% 12/19/22  7. Patient will will be able to return to community-based exercise program to maintain progress. Baseline: stopped exercising due to pain Goal status: IN PROGRESS    PLAN:  PT FREQUENCY: 1-2x/week  PT DURATION: 4 weeks  PLANNED INTERVENTIONS: Therapeutic exercises, Therapeutic activity, Neuromuscular re-education, Balance training, Gait training, Patient/Family education, Self Care, Joint mobilization, Stair training, Dry Needling, Electrical stimulation, Spinal mobilization, Cryotherapy, Moist heat, Taping, Traction, Ultrasound, Manual therapy, and Re-evaluation  PLAN FOR NEXT SESSION: Recert due after 12/27/36. continue gentle strengthing for core and posture, manual therapy, modalities PRN, will consider dry needling.    Maryama Kuriakose, PT 01/28/2023, 2:28 PM

## 2023-01-28 ENCOUNTER — Ambulatory Visit: Payer: 59 | Attending: Family Medicine | Admitting: Physical Therapy

## 2023-01-28 ENCOUNTER — Encounter: Payer: Self-pay | Admitting: Physical Therapy

## 2023-01-28 DIAGNOSIS — G8929 Other chronic pain: Secondary | ICD-10-CM | POA: Diagnosis not present

## 2023-01-28 DIAGNOSIS — R293 Abnormal posture: Secondary | ICD-10-CM | POA: Insufficient documentation

## 2023-01-28 DIAGNOSIS — M542 Cervicalgia: Secondary | ICD-10-CM | POA: Insufficient documentation

## 2023-01-28 DIAGNOSIS — M25511 Pain in right shoulder: Secondary | ICD-10-CM | POA: Diagnosis not present

## 2023-01-28 DIAGNOSIS — M5441 Lumbago with sciatica, right side: Secondary | ICD-10-CM | POA: Insufficient documentation

## 2023-01-28 DIAGNOSIS — R252 Cramp and spasm: Secondary | ICD-10-CM | POA: Insufficient documentation

## 2023-01-31 ENCOUNTER — Encounter: Payer: Self-pay | Admitting: Physical Therapy

## 2023-01-31 ENCOUNTER — Ambulatory Visit: Payer: 59 | Admitting: Physical Therapy

## 2023-01-31 DIAGNOSIS — R252 Cramp and spasm: Secondary | ICD-10-CM | POA: Diagnosis not present

## 2023-01-31 DIAGNOSIS — M5441 Lumbago with sciatica, right side: Secondary | ICD-10-CM | POA: Diagnosis not present

## 2023-01-31 DIAGNOSIS — R293 Abnormal posture: Secondary | ICD-10-CM

## 2023-01-31 DIAGNOSIS — M542 Cervicalgia: Secondary | ICD-10-CM

## 2023-01-31 DIAGNOSIS — G8929 Other chronic pain: Secondary | ICD-10-CM

## 2023-01-31 NOTE — Therapy (Signed)
OUTPATIENT PHYSICAL THERAPY TREATMENT  Progress Note/Recert Reporting Period 01/01/2023 to 01/31/2023  See note below for Objective Data and Assessment of Progress/Goals.     Patient Name: [name] MRN: [id number] DOB:1946-06-27, 77 y.o., female Today's Date: 01/31/2023   PT End of Session - 01/31/23 1410     Visit Number 16    Number of Visits 19    Date for PT Re-Evaluation 01/31/23    Authorization Type [employer] Medicare    Progress Note Due on Visit 20    PT Start Time 1409    PT Stop Time 1447    PT Time Calculation (min) 38 min    Activity Tolerance Patient tolerated treatment well    Behavior During Therapy [facility] for tasks assessed/performed                      Past Medical History:  Diagnosis Date   Acute MI, subendocardial ([place])    Arthritis    Back pain 05/31/2013   Benign paroxysmal positional vertigo 08/05/2016   CAD (coronary artery disease)    a. NSTEMI 10/2011: [place] 11/19/11: pLAD 30%, oOM 60%, AVCFX 30%, CFX after OM2 30%, pRCA 60 and 70%, then 99%, AM 80-90% with TIMI 3 flow.  PCI: Promus DES x 2 to RCA; b. 06/2012 Cath: patent RCA stents w/ subtl occl of Acute Marginal (jailed)->Med rx; c. 05/2015 MV: EF 59%, mod mid infsept/inf/ap lat/ap infarct with peri-infarct isch-->Med Rx; d. 02/2016 Cath: LM nl, LAD 46m RI 50, RCA patent stents.   Colitis    Facial skin lesion 01/28/2017   GERD (gastroesophageal reflux disease)    occasional   Hyperlipidemia    Hypertension    Hypertensive heart disease    a. Echocardiogram 11/19/11: Difficult acoustic windows, EF 60-65%, normal LV wall thickness, grade 1 diastolic dysfunction   Hypoparathyroidism (HCC)    Low zinc level 11/26/2016   Multinodular thyroid    Goiter s/p thyroidectomy in 2007 with post-op  hypocalcemia and post-op hypothyroidism/hypoparathyroidism with hypocalcemia   Neck pain on right side 07/13/2013   Nocturia 05/31/2013   Osteoarthritis    Pain in right axilla 03/13/2016   Palpitations    a.  03/2012 - patient set up for event monitor but did not wear correctly -  she declined wearing a repeat monitor   Post-surgical hypothyroidism    Sun-damaged skin 12/05/2014   Tubular adenoma of colon 06/2011   Unspecified constipation 05/31/2013   Vertigo    Zinc deficiency 11/26/2015   Past Surgical History:  Procedure Laterality Date   CARDIAC CATHETERIZATION N/A 03/08/2016   Procedure: Left Heart Cath and Coronary Angiography;  Surgeon: JJettie Booze MD;  [facility];  Service: Cardiovascular;  Laterality: N/A;   COLONOSCOPY  Aenomatous polyps   07/05/2011   COLONOSCOPY N/A 09/28/2014   Procedure: COLONOSCOPY;  Surgeon: MLadene Artist MD;  Location: [facility];  Service: Endoscopy;  Laterality: N/A;   CORONARY ANGIOPLASTY WITH STENT PLACEMENT     LEFT HEART CATH AND CORONARY ANGIOGRAPHY N/A 12/09/2018   Procedure: LEFT HEART CATH AND CORONARY ANGIOGRAPHY;  Surgeon: VJettie Booze MD;  [facility];  Service: Cardiovascular;  Laterality: N/A;   LEFT HEART CATHETERIZATION WITH CORONARY ANGIOGRAM N/A 07/17/2012   Procedure: LEFT HEART CATHETERIZATION WITH CORONARY ANGIOGRAM;  Surgeon: THillary Bow MD;  [facility];  Service: Cardiovascular;  Laterality: N/A;   PERCUTANEOUS CORONARY STENT INTERVENTION (PCI-S) N/A 11/19/2011  Procedure: PERCUTANEOUS CORONARY STENT INTERVENTION (PCI-S);  Surgeon: Hillary Bow, MD;  Location: [facility];  Service: Cardiovascular;  Laterality: N/A;   POLYPECTOMY     SHOULDER ARTHROSCOPY W/ ROTATOR CUFF REPAIR     right   TOTAL THYROIDECTOMY  2007   GOITER   Patient Active Problem List   Diagnosis Date Noted   Paresthesia 08/28/2022   Chronic neck pain 08/28/2022   Neuropathy 08/21/2022   Vitamin deficiency 10/23/2021   Hypoglycemia 10/07/2019   Heart disease 01/29/2019   Sensorineural hearing loss (SNHL) of both ears 01/27/2019   Primary osteoarthritis of both knees 12/19/2018   DDD  (degenerative disc disease), lumbar 12/19/2018   ANA positive 10/27/2018   Anxiety 02/17/2018   Peripheral arterial disease ([place]) 02/20/2017   Skin lesion of face 01/28/2017   Benign paroxysmal positional vertigo 08/05/2016   Antiplatelet or antithrombotic long-term use 03/27/2016   Right lumbar radiculopathy 02/21/2016   Abnormality of gait 02/21/2016   Zinc deficiency 11/26/2015   Right hip pain 10/26/2015   Medicare annual wellness visit, subsequent 03/13/2015   Preventative health care 03/13/2015   Sun-damaged skin 12/05/2014   Angina of effort 12/02/2014   Hx of colonic polyps 09/28/2014   Benign neoplasm of descending colon 09/28/2014   Other fatigue 09/10/2014   Personal history of colonic polyps 07/16/2014   Bilateral thumb pain 03/20/2014   Postsurgical hypoparathyroidism ([place]) 08/03/2013   Neck pain on right side 07/13/2013   Constipation 05/31/2013   Back pain 05/31/2013   Nocturia 05/31/2013   GERD (gastroesophageal reflux disease) 11/02/2012   Tension headache 11/02/2012   Anemia 06/11/2012   Hypokalemia 06/11/2012   Depression 01/21/2012   Headache(784.0) 12/23/2011   Bradycardia 12/07/2011   Hypocalcemia 12/07/2011   Acute MI, subendocardial ([place]) 11/20/2011   Hypothyroidism 05/20/2009   Essential hypertension, benign 05/20/2009   Hyperlipidemia 03/08/2009   CAD, NATIVE VESSEL 03/08/2009    PCP: Mosie Lukes, MD  REFERRING PROVIDER: Glenford Peers, NP  REFERRING DIAG: [phone] (ICD-10-CM) - Other spondylosis with radiculopathy, cervical region  THERAPY DIAG:  Cervicalgia  Chronic right-sided low back pain with right-sided sciatica  Abnormal posture  Cramp and spasm  Rationale for Evaluation and Treatment: Rehabilitation  ONSET DATE: chronic for years  SUBJECTIVE:                                                                                                                                                                                                          SUBJECTIVE STATEMENT: [name] reports she is a lot better than last  week, feels that PT helped and would like to repeat interventions.  Still doesn't know what provoked her pain.  Feels like she has gone backwards because of the shoulder pain.  Had sharp pain shoot up her neck today.   PERTINENT HISTORY:  Surgical hypoparathyrodism, angina (has nitro), Hypothyroidism, hyperlipidemia, HTN, neuropathy hands and feet, R RTC repair  PAIN:  Are you having pain? Yes: NPRS scale: 5/10 Pain location: R shoulder Pain description: sometimes sharp shooting, sometimes arms feel weak, constant pain Aggravating factors: moving head overhead, reaching behind her back, turning neck (gets dizzy),  Relieving factors: holding arms close, avoiding movement.   Are you having pain? Yes: NPRS scale: 5/10 today, pain with bending and sleeping on R side Pain location: low back, down R side to mid calf, sometimes numbness in feet with prolonged standing Pain description: pain and numbness Aggravating factors: bending over Relieving factors: laying down, icy/hot and patches, hot showers  PRECAUTIONS: None  WEIGHT BEARING RESTRICTIONS: No  FALLS:  Has patient fallen in last 6 months? No  LIVING ENVIRONMENT: Lives with: lives alone Lives in: House/apartment Stairs: No Has following equipment at home: None  OCCUPATION: retired  PLOF: Independent and Leisure: was going to [employer] but stopped due to pain  PATIENT GOALS: I want to be pain free without medications, know what machines are safe to use  NEXT MD VISIT: 02/02/23 with PCP  OBJECTIVE:   DIAGNOSTIC FINDINGS:  MRI done but not available to review  PATIENT SURVEYS:  NDI 23/50 = 46%  moderate disability   COGNITION: Overall cognitive status: Within functional limits for tasks assessed  SENSATION: Light touch: WFL  POSTURE: rounded shoulders and forward head  PALPATION: Tenderness throughout bil upper traps, levator  scapulae, and cervical paraspinals, wincing and jumping away from firm touch.    CERVICAL ROM:   Active ROM AROM (deg) eval AROM 12/19/22 AROM 01/23/23 AROM 01/31/2023  Flexion 15 22 34 20  Extension '20 25 24 20  '$ Right lateral flexion 10 29    Left lateral flexion 15 32    Right rotation 40 50 45 42  Left rotation 40 50 57 35   (Blank rows = not tested)  UPPER EXTREMITY MMT:   MMT Right eval Left eval  Shoulder flexion 4+ 4+  Shoulder extension    Shoulder abduction 4+* 4  Shoulder adduction    Shoulder internal rotation 5 5  Shoulder external rotation 5 5  Elbow flexion 5 5  Elbow extension 5 5  Wrist flexion 5 5  Wrist extension 5 5   (Blank rows = not tested)  UPPER EXTREMITY ROM:  MMT Right eval Left eval  Shoulder flexion 115* 120  Shoulder extension 40 65  Shoulder abduction 100 135  Shoulder adduction    Shoulder internal rotation    Shoulder external rotation     (Blank rows = not tested)  LOWER EXTREMITY MMT:    MMT Right eval Left eval  Hip flexion 4 4  Hip extension    Hip abduction 5 5  Hip adduction 4+ 4+  Hip internal rotation    Hip external rotation    Knee flexion    Knee extension 5 5  Ankle dorsiflexion 5 5  Ankle plantarflexion     (Blank rows = not tested)   LUMBAR ROM:   Active  AROM  11/09/2022 AROM 01/01/23  Flexion To mid-thigh Mid shin  Extension WNL, no pain, but increased pain after repeated 5x Limited 50% - pain  Right lateral flexion Inc pain R side back, to knee To knee - increased pain  Left lateral flexion To mid thigh, increased pain R side back To knee - increased pain  Right rotation WNL WNL  Left rotation WNL WNL   (Blank rows = not tested)   CERVICAL SPECIAL TESTS:  Spurling's test: Positive right  FUNCTIONAL TESTS:  5 times sit to stand: 35 seconds Increased sciatic pain with R leg extension in sitting.   5x STS: 31 seconds (01/01/23) TODAY'S TREATMENT:                                                                                                                               DATE:  01/31/23 Therapeutic Exercise: to improve strength and mobility.  Demo, verbal and tactile cues throughout for technique. Nustep L4 x 2 min  Seated shoulder isometrics - 5 x 5 sec hold, submax contraction - flexion, extension, external rotation, internal rotation, scap squeeze.  Manual Therapy: to decrease muscle spasm and pain and improve mobility STM/TPR to R levator scapulae, cervical paraspinals, UT, deltoids, IASTM to R deltoid, biceps.  Modalities: MHP to bil UT x 10 min concurrent with manual therapy to upper arm  Therapeutic Activity:  assessment of progress towards goals.  Cervical ROM, modified [place], NDI     01/28/23 Modalities:  Estim IFC x 15 min to pt tolerance to R shoulder to decrease pain and spasm  Self Care: discussed holding off exercises for a few days post injection, advised positioning, focused relaxation to decrease tension in R UT Pendulums for pain relief   01/23/23 Therapeutic Exercise: to improve strength and mobility.  Demo, verbal and tactile cues throughout for technique.  Rec Bike [id number] Standing shoulder ext 10#, 15# x 10 each Standing lat pull 20# 2x10 Seated row 25# 3x10 RDL one leg 2x10 bil Seated QL stretch with 3x30 sec  01/22/23 Therapeutic Exercise: to improve strength and mobility.  Demo, verbal and tactile cues throughout for technique.  Rec Bike L2x541m Standing lat pull 15# 2x10 Seated row 25# 2x15 Farmer carry unilateral in shoulder flexion 5# 1 lap around clinic Knee flexion 25# 2x10 Knee extension 15# 2x10 One leg RDL 2x10 R/L Seated R QL stretch 2x30 sec  PATIENT EDUCATION:  Education details: HEP update 11/09/22, 01/31/23 Person educated: Patient Education method: Explanation, Demonstration, Verbal cues, and Handouts Education comprehension: verbalized understanding and returned demonstration  HOME EXERCISE PROGRAM: Access Code: [email or web address] URL:  [email or web address] Date: 01/31/2023 Prepared by: ElGlenetta HewExercises - Seated Cervical Retraction  - 3 x daily - 7 x weekly - 1 sets - 10 reps - Seated Scapular Retraction  - 3 x daily - 7 x weekly - 1 sets - 10 reps - Seated Shoulder Rolls  - 3 x daily - 7 x weekly - 1 sets - 10 reps - Cervical Extension AROM with Strap  - 1 x daily - 7 x weekly - 1 sets -  10 reps - Seated Assisted Cervical Rotation with Towel  - 1 x daily - 7 x weekly - 1 sets - 10 reps - Standing Thoracic Open Book at [place]  - 1 x daily - 7 x weekly - 1 sets - 10 reps - Supine Bridge with Resistance Band  - 1 x daily - 7 x weekly - 2 sets - 10 reps - Clamshell with Resistance  - 1 x daily - 7 x weekly - 2 sets - 10 reps - Supine 90/90 Alternating Toe Touch  - 1 x daily - 7 x weekly - 2 sets - 10 reps - Supine Active Straight Leg Raise  - 1 x daily - 7 x weekly - 2 sets - 10 reps - Forward T with Counter Support  - 1 x daily - 7 x weekly - 2 sets - 10 reps - Seated Quadratus Lumborum Stretch in Chair  - 1 x daily - 7 x weekly - 3 sets - 15 sec hold - Seated Scapular Retraction  - 3 x daily - 7 x weekly - 1 sets - 5 reps - 5 sec  hold - Seated Isometric Shoulder Internal Rotation with Towel  - 3 x daily - 7 x weekly - 1 sets - 5 reps - 5 sec hold - Seated Isometric Shoulder External Rotation  - 3 x daily - 7 x weekly - 1 sets - 5 reps - 5 sec hold - Isometric Shoulder Flexion at Wall  - 3 x daily - 7 x weekly - 1 sets - 5 reps - 5 sec hold - Isometric Shoulder Extension at Wall  - 3 x daily - 7 x weekly - 1 sets - 5 reps - 5 sec hold  ASSESSMENT:  CLINICAL IMPRESSION: [name] reports overall significant improvement in neck and back pain, but has had a set back due to new onset R shoulder pain with no known MOI, although not as severe as last session.  She has now met LTG #6 and making good progress on remaining goals, other than cervical ROM was decreased today due to R shoulder pain.  Due  to R shoulder pain, extending plan of care additional 4 weeks to allow healing and recovery in order to be able to transition to community based exercise program.  Today focused on manual therapy to decrease muscle spasm in R cervical paraspinals and shoulder, with MHP and given submax shoulder isometrics which she tolerated well.  After interventions reported significantly decreased R shoulder pain.  [name] continues to demonstrate potential for improvement and would benefit from continued skilled therapy to address impairments.     OBJECTIVE IMPAIRMENTS: decreased activity tolerance, decreased mobility, decreased ROM, decreased strength, dizziness, hypomobility, increased fascial restrictions, impaired perceived functional ability, increased muscle spasms, impaired flexibility, postural dysfunction, and pain.   ACTIVITY LIMITATIONS: carrying, lifting, bending, standing, sleeping, stairs, transfers, dressing, reach over head, and hygiene/grooming  PARTICIPATION LIMITATIONS: meal prep, cleaning, laundry, driving, shopping, and community activity  PERSONAL FACTORS: Age, Time since onset of injury/illness/exacerbation, and 3+ comorbidities: angina, hypothyroidism, neck surgery, neuropathy, R RTC repair, osteoporosis, CAD, history MI, BPPV, chronic low back pain  are also affecting patient's functional outcome.   REHAB POTENTIAL: Good  CLINICAL DECISION MAKING: Evolving/moderate complexity  EVALUATION COMPLEXITY: Moderate   GOALS: Goals reviewed with patient? Yes  SHORT TERM GOALS: Target date: 11/20/2022   Patient will be independent with initial HEP.  Baseline: given Goal status: MET - 11/21/22   LONG  TERM GOALS: Target date: 02/28/2023   Patient will be independent with advanced/ongoing HEP to improve outcomes and carryover.  Baseline:  Goal status: IN PROGRESS 01/31/23- good compliance, advanced today  2.  Patient will report 75% improvement in neck pain to improve QOL.   Baseline: severe Goal status: IN PROGRESS- 12/19/22 65% improvement 01/31/2023- 70% improvement.   3.  Patient will demonstrate improved cervical ROM by 10 deg all directions for safety with driving.  Baseline: see objective Goal status: IN PROGRESS-01/31/23- decreased due to shoulder pain  4.  Patient will report 15% improvement on NDI to demonstrate improved functional ability.  Baseline: 46% Goal status: IN PROGRESS 01/31/23- 34%  5.  Patient will report 75% improvement in low back pain to improve quality of life. Baseline:   Goal status: MET- 11/27/22  60-70% ; 01/31/23- 75%  6. Patient will report 12% improvement on modified Oswestry to demonstrate improved functional ability. Baseline: 12/19/22 -30% Goal status: MET   01/31/23- 18% impairment.   7. Patient will will be able to return to community-based exercise program to maintain progress. Baseline: stopped exercising due to pain Goal status: IN PROGRESS    PLAN:  PT FREQUENCY: 1-2x/week  PT DURATION: 4 weeks  PLANNED INTERVENTIONS: Therapeutic exercises, Therapeutic activity, Neuromuscular re-education, Balance training, Gait training, Patient/Family education, Self Care, Joint mobilization, Stair training, Dry Needling, Electrical stimulation, Spinal mobilization, Cryotherapy, Moist heat, Taping, Traction, Ultrasound, Manual therapy, and Re-evaluation  PLAN FOR NEXT SESSION: continue to progress postural exercises as tolerated, manual therapy, modalities PRN.    Rennie Natter, PT, DPT  01/31/2023, 3:19 PM

## 2023-02-04 ENCOUNTER — Ambulatory Visit: Payer: 59 | Admitting: Physical Therapy

## 2023-02-04 ENCOUNTER — Encounter: Payer: Self-pay | Admitting: Physical Therapy

## 2023-02-04 DIAGNOSIS — I83891 Varicose veins of right lower extremities with other complications: Secondary | ICD-10-CM | POA: Diagnosis not present

## 2023-02-04 DIAGNOSIS — M542 Cervicalgia: Secondary | ICD-10-CM | POA: Diagnosis not present

## 2023-02-04 DIAGNOSIS — R252 Cramp and spasm: Secondary | ICD-10-CM | POA: Diagnosis not present

## 2023-02-04 DIAGNOSIS — R293 Abnormal posture: Secondary | ICD-10-CM

## 2023-02-04 DIAGNOSIS — G8929 Other chronic pain: Secondary | ICD-10-CM

## 2023-02-04 DIAGNOSIS — M5441 Lumbago with sciatica, right side: Secondary | ICD-10-CM | POA: Diagnosis not present

## 2023-02-04 NOTE — Therapy (Signed)
OUTPATIENT PHYSICAL THERAPY TREATMENT   Patient Name: [name] MRN: [id number] DOB:11-Jan-1946, 77 y.o., female Today's Date: 02/04/2023   PT End of Session - 02/04/23 1443     Visit Number 17    Number of Visits 25    Date for PT Re-Evaluation 02/28/23    Authorization Type [employer] Medicare    Progress Note Due on Visit 25    PT Start Time 1410    PT Stop Time 1440    PT Time Calculation (min) 30 min    Activity Tolerance Patient tolerated treatment well    Behavior During Therapy [facility] for tasks assessed/performed                       Past Medical History:  Diagnosis Date   Acute MI, subendocardial ([place])    Arthritis    Back pain 05/31/2013   Benign paroxysmal positional vertigo 08/05/2016   CAD (coronary artery disease)    a. NSTEMI 10/2011: [place] 11/19/11: pLAD 30%, oOM 60%, AVCFX 30%, CFX after OM2 30%, pRCA 60 and 70%, then 99%, AM 80-90% with TIMI 3 flow.  PCI: Promus DES x 2 to RCA; b. 06/2012 Cath: patent RCA stents w/ subtl occl of Acute Marginal (jailed)->Med rx; c. 05/2015 MV: EF 59%, mod mid infsept/inf/ap lat/ap infarct with peri-infarct isch-->Med Rx; d. 02/2016 Cath: LM nl, LAD 46m RI 50, RCA patent stents.   Colitis    Facial skin lesion 01/28/2017   GERD (gastroesophageal reflux disease)    occasional   Hyperlipidemia    Hypertension    Hypertensive heart disease    a. Echocardiogram 11/19/11: Difficult acoustic windows, EF 60-65%, normal LV wall thickness, grade 1 diastolic dysfunction   Hypoparathyroidism (HCC)    Low zinc level 11/26/2016   Multinodular thyroid    Goiter s/p thyroidectomy in 2007 with post-op  hypocalcemia and post-op hypothyroidism/hypoparathyroidism with hypocalcemia   Neck pain on right side 07/13/2013   Nocturia 05/31/2013   Osteoarthritis    Pain in right axilla 03/13/2016   Palpitations    a. 03/2012 - patient set up for event monitor but did not wear correctly -  she declined wearing a repeat monitor   Post-surgical  hypothyroidism    Sun-damaged skin 12/05/2014   Tubular adenoma of colon 06/2011   Unspecified constipation 05/31/2013   Vertigo    Zinc deficiency 11/26/2015   Past Surgical History:  Procedure Laterality Date   CARDIAC CATHETERIZATION N/A 03/08/2016   Procedure: Left Heart Cath and Coronary Angiography;  Surgeon: JJettie Booze MD;  [facility];  Service: Cardiovascular;  Laterality: N/A;   COLONOSCOPY  Aenomatous polyps   07/05/2011   COLONOSCOPY N/A 09/28/2014   Procedure: COLONOSCOPY;  Surgeon: MLadene Artist MD;  Location: [facility];  Service: Endoscopy;  Laterality: N/A;   CORONARY ANGIOPLASTY WITH STENT PLACEMENT     LEFT HEART CATH AND CORONARY ANGIOGRAPHY N/A 12/09/2018   Procedure: LEFT HEART CATH AND CORONARY ANGIOGRAPHY;  Surgeon: VJettie Booze MD;  [facility];  Service: Cardiovascular;  Laterality: N/A;   LEFT HEART CATHETERIZATION WITH CORONARY ANGIOGRAM N/A 07/17/2012   Procedure: LEFT HEART CATHETERIZATION WITH CORONARY ANGIOGRAM;  Surgeon: THillary Bow MD;  [facility];  Service: Cardiovascular;  Laterality: N/A;   PERCUTANEOUS CORONARY STENT INTERVENTION (PCI-S) N/A 11/19/2011   Procedure: PERCUTANEOUS CORONARY STENT INTERVENTION (PCI-S);  Surgeon: THillary Bow MD;  [facility];  Service: Cardiovascular;  Laterality: N/A;   POLYPECTOMY     SHOULDER ARTHROSCOPY W/ ROTATOR CUFF REPAIR     right   TOTAL THYROIDECTOMY  2007   GOITER   Patient Active Problem List   Diagnosis Date Noted   Paresthesia 08/28/2022   Chronic neck pain 08/28/2022   Neuropathy 08/21/2022   Vitamin deficiency 10/23/2021   Hypoglycemia 10/07/2019   Heart disease 01/29/2019   Sensorineural hearing loss (SNHL) of both ears 01/27/2019   Primary osteoarthritis of both knees 12/19/2018   DDD (degenerative disc disease), lumbar 12/19/2018   ANA positive 10/27/2018   Anxiety 02/17/2018   Peripheral arterial disease  ([place]) 02/20/2017   Skin lesion of face 01/28/2017   Benign paroxysmal positional vertigo 08/05/2016   Antiplatelet or antithrombotic long-term use 03/27/2016   Right lumbar radiculopathy 02/21/2016   Abnormality of gait 02/21/2016   Zinc deficiency 11/26/2015   Right hip pain 10/26/2015   Medicare annual wellness visit, subsequent 03/13/2015   Preventative health care 03/13/2015   Sun-damaged skin 12/05/2014   Angina of effort 12/02/2014   Hx of colonic polyps 09/28/2014   Benign neoplasm of descending colon 09/28/2014   Other fatigue 09/10/2014   Personal history of colonic polyps 07/16/2014   Bilateral thumb pain 03/20/2014   Postsurgical hypoparathyroidism ([place]) 08/03/2013   Neck pain on right side 07/13/2013   Constipation 05/31/2013   Back pain 05/31/2013   Nocturia 05/31/2013   GERD (gastroesophageal reflux disease) 11/02/2012   Tension headache 11/02/2012   Anemia 06/11/2012   Hypokalemia 06/11/2012   Depression 01/21/2012   Headache(784.0) 12/23/2011   Bradycardia 12/07/2011   Hypocalcemia 12/07/2011   Acute MI, subendocardial ([place]) 11/20/2011   Hypothyroidism 05/20/2009   Essential hypertension, benign 05/20/2009   Hyperlipidemia 03/08/2009   CAD, NATIVE VESSEL 03/08/2009    PCP: Mosie Lukes, MD  REFERRING PROVIDER: Glenford Peers, NP  REFERRING DIAG: [phone] (ICD-10-CM) - Other spondylosis with radiculopathy, cervical region  THERAPY DIAG:  Cervicalgia  Chronic right-sided low back pain with right-sided sciatica  Abnormal posture  Cramp and spasm  Rationale for Evaluation and Treatment: Rehabilitation  ONSET DATE: chronic for years  SUBJECTIVE:                                                                                                                                                                                                         SUBJECTIVE STATEMENT: Due to Dr. Hilaria Ota. Patient arrived late today.  She repots shoulder is feeling  better but still painful.  Also complaining about her back and left lip tingling.  PERTINENT HISTORY:  Surgical hypoparathyrodism, angina (has nitro), Hypothyroidism, hyperlipidemia, HTN, neuropathy hands and feet, R RTC repair  PAIN:  Are you having pain? Yes: NPRS scale: 4-5/10 Pain location: R shoulder Pain description: sometimes sharp shooting, sometimes arms feel weak, constant pain Aggravating factors: moving head overhead, reaching behind her back, turning neck (gets dizzy),  Relieving factors: holding arms close, avoiding movement.   Are you having pain? Yes: NPRS scale: 5/10 today, pain with bending and sleeping on R side Pain location: low back, down R side to mid calf, sometimes numbness in feet with prolonged standing Pain description: pain and numbness Aggravating factors: bending over Relieving factors: laying down, icy/hot and patches, hot showers  PRECAUTIONS: None  WEIGHT BEARING RESTRICTIONS: No  FALLS:  Has patient fallen in last 6 months? No  LIVING ENVIRONMENT: Lives with: lives alone Lives in: House/apartment Stairs: No Has following equipment at home: None  OCCUPATION: retired  PLOF: Independent and Leisure: was going to [employer] but stopped due to pain  PATIENT GOALS: I want to be pain free without medications, know what machines are safe to use  NEXT MD VISIT: 02/02/23 with PCP  OBJECTIVE:   DIAGNOSTIC FINDINGS:  MRI done but not available to review  PATIENT SURVEYS:  NDI 23/50 = 46%  moderate disability   COGNITION: Overall cognitive status: Within functional limits for tasks assessed  SENSATION: Light touch: WFL  POSTURE: rounded shoulders and forward head  PALPATION: Tenderness throughout bil upper traps, levator scapulae, and cervical paraspinals, wincing and jumping away from firm touch.    CERVICAL ROM:   Active ROM AROM (deg) eval AROM 12/19/22 AROM 01/23/23 AROM 01/31/2023  Flexion 15 22 34 20  Extension 20 25 24 20  $ Right  lateral flexion 10 29    Left lateral flexion 15 32    Right rotation 40 50 45 42  Left rotation 40 50 57 35   (Blank rows = not tested)  UPPER EXTREMITY MMT:   MMT Right eval Left eval  Shoulder flexion 4+ 4+  Shoulder extension    Shoulder abduction 4+* 4  Shoulder adduction    Shoulder internal rotation 5 5  Shoulder external rotation 5 5  Elbow flexion 5 5  Elbow extension 5 5  Wrist flexion 5 5  Wrist extension 5 5   (Blank rows = not tested)  UPPER EXTREMITY ROM:  MMT Right eval Left eval  Shoulder flexion 115* 120  Shoulder extension 40 65  Shoulder abduction 100 135  Shoulder adduction    Shoulder internal rotation    Shoulder external rotation     (Blank rows = not tested)  LOWER EXTREMITY MMT:    MMT Right eval Left eval  Hip flexion 4 4  Hip extension    Hip abduction 5 5  Hip adduction 4+ 4+  Hip internal rotation    Hip external rotation    Knee flexion    Knee extension 5 5  Ankle dorsiflexion 5 5  Ankle plantarflexion     (Blank rows = not tested)   LUMBAR ROM:   Active  AROM  11/09/2022 AROM 01/01/23  Flexion To mid-thigh Mid shin  Extension WNL, no pain, but increased pain after repeated 5x Limited 50% - pain  Right lateral flexion Inc pain R side back, to knee To knee - increased pain  Left lateral flexion To mid thigh, increased pain R side back To knee - increased pain  Right rotation WNL WNL  Left rotation WNL  WNL   (Blank rows = not tested)   CERVICAL SPECIAL TESTS:  Spurling's test: Positive right  FUNCTIONAL TESTS:  5 times sit to stand: 35 seconds Increased sciatic pain with R leg extension in sitting.   5x STS: 31 seconds (01/01/23) TODAY'S TREATMENT:                                                                                                                              DATE: 02/04/2023  Therapeutic Exercise:  UBE forward x 3 min level 1 Manual Therapy: to decrease muscle spasm and pain and improve  mobility STM/TPR to bil UT, cervical paraspinals, R biceps, skilled palpation and monitoring during dry needling. Trigger Point Dry-Needling  Treatment instructions: Expect mild to moderate muscle soreness. S/S of pneumothorax if dry needled over a lung field, and to seek immediate medical attention should they occur. Patient verbalized understanding of these instructions and education. Patient Consent Given: Yes Education handout provided: Yes Muscles treated: R UT Electrical stimulation performed: No Parameters: N/A Treatment response/outcome: Twitch Response Elicited and Palpable Increase in Muscle Length Ultrasound: x 8 min to R biceps 1 MHz, 1.2 w/cm2 cont to decrease inflammation/pain  01/31/23 Therapeutic Exercise: to improve strength and mobility.  Demo, verbal and tactile cues throughout for technique. Nustep L4 x 2 min  Seated shoulder isometrics - 5 x 5 sec hold, submax contraction - flexion, extension, external rotation, internal rotation, scap squeeze.  Manual Therapy: to decrease muscle spasm and pain and improve mobility STM/TPR to R levator scapulae, cervical paraspinals, UT, deltoids, IASTM to R deltoid, biceps.  Modalities: MHP to bil UT x 10 min concurrent with manual therapy to upper arm  Therapeutic Activity:  assessment of progress towards goals.  Cervical ROM, modified [place], NDI     01/28/23 Modalities:  Estim IFC x 15 min to pt tolerance to R shoulder to decrease pain and spasm  Self Care: discussed holding off exercises for a few days post injection, advised positioning, focused relaxation to decrease tension in R UT Pendulums for pain relief   01/23/23 Therapeutic Exercise: to improve strength and mobility.  Demo, verbal and tactile cues throughout for technique.  Rec Bike L2x11mn Standing shoulder ext 10#, 15# x 10 each Standing lat pull 20# 2x10 Seated row 25# 3x10 RDL one leg 2x10 bil Seated QL stretch with 3x30 sec  01/22/23 Therapeutic Exercise: to  improve strength and mobility.  Demo, verbal and tactile cues throughout for technique.  Rec Bike L2x548m Standing lat pull 15# 2x10 Seated row 25# 2x15 Farmer carry unilateral in shoulder flexion 5# 1 lap around clinic Knee flexion 25# 2x10 Knee extension 15# 2x10 One leg RDL 2x10 R/L Seated R QL stretch 2x30 sec  PATIENT EDUCATION:  Education details: HEP update 11/09/22, 01/31/23 Person educated: Patient Education method: Explanation, Demonstration, Verbal cues, and Handouts Education comprehension: verbalized understanding and returned demonstration  HOME EXERCISE PROGRAM: Access Code: [email or web address] URL: [email or web address] Date: 01/31/2023 Prepared by:  Glenetta Hew  Exercises - Seated Cervical Retraction  - 3 x daily - 7 x weekly - 1 sets - 10 reps - Seated Scapular Retraction  - 3 x daily - 7 x weekly - 1 sets - 10 reps - Seated Shoulder Rolls  - 3 x daily - 7 x weekly - 1 sets - 10 reps - Cervical Extension AROM with Strap  - 1 x daily - 7 x weekly - 1 sets - 10 reps - Seated Assisted Cervical Rotation with Towel  - 1 x daily - 7 x weekly - 1 sets - 10 reps - Standing Thoracic Open Book at [place]  - 1 x daily - 7 x weekly - 1 sets - 10 reps - Supine Bridge with Resistance Band  - 1 x daily - 7 x weekly - 2 sets - 10 reps - Clamshell with Resistance  - 1 x daily - 7 x weekly - 2 sets - 10 reps - Supine 90/90 Alternating Toe Touch  - 1 x daily - 7 x weekly - 2 sets - 10 reps - Supine Active Straight Leg Raise  - 1 x daily - 7 x weekly - 2 sets - 10 reps - Forward T with Counter Support  - 1 x daily - 7 x weekly - 2 sets - 10 reps - Seated Quadratus Lumborum Stretch in Chair  - 1 x daily - 7 x weekly - 3 sets - 15 sec hold - Seated Scapular Retraction  - 3 x daily - 7 x weekly - 1 sets - 5 reps - 5 sec  hold - Seated Isometric Shoulder Internal Rotation with Towel  - 3 x daily - 7 x weekly - 1 sets - 5 reps - 5 sec hold - Seated Isometric Shoulder External  Rotation  - 3 x daily - 7 x weekly - 1 sets - 5 reps - 5 sec hold - Isometric Shoulder Flexion at Wall  - 3 x daily - 7 x weekly - 1 sets - 5 reps - 5 sec hold - Isometric Shoulder Extension at Wall  - 3 x daily - 7 x weekly - 1 sets - 5 reps - 5 sec hold  ASSESSMENT:  CLINICAL IMPRESSION: [name] reports slow improvement in R shoulder pain.  She did report new symptoms with tingling in L lips, performed quick screen but did not have any facial, UE or LE weakness.  She consented to trial of TrDN today of UT, tolerated fairly, but kept TrDN minimal today as noted elevated response to twitches, tensing up entire body, so only 2 needles inserted today.  [place] applied to R biceps to improve blood flow and decrease inflammation.   [name] continues to demonstrate potential for improvement and would benefit from continued skilled therapy to address impairments.     OBJECTIVE IMPAIRMENTS: decreased activity tolerance, decreased mobility, decreased ROM, decreased strength, dizziness, hypomobility, increased fascial restrictions, impaired perceived functional ability, increased muscle spasms, impaired flexibility, postural dysfunction, and pain.   ACTIVITY LIMITATIONS: carrying, lifting, bending, standing, sleeping, stairs, transfers, dressing, reach over head, and hygiene/grooming  PARTICIPATION LIMITATIONS: meal prep, cleaning, laundry, driving, shopping, and community activity  PERSONAL FACTORS: Age, Time since onset of injury/illness/exacerbation, and 3+ comorbidities: angina, hypothyroidism, neck surgery, neuropathy, R RTC repair, osteoporosis, CAD, history MI, BPPV, chronic low back pain  are also affecting patient's functional outcome.   REHAB POTENTIAL: Good  CLINICAL DECISION MAKING: Evolving/moderate complexity  EVALUATION COMPLEXITY: Moderate  GOALS: Goals reviewed with patient? Yes  SHORT TERM GOALS: Target date: 11/20/2022   Patient will be independent with initial  HEP.  Baseline: given Goal status: MET - 11/21/22   LONG TERM GOALS: Target date: 02/28/2023   Patient will be independent with advanced/ongoing HEP to improve outcomes and carryover.  Baseline:  Goal status: IN PROGRESS 01/31/23- good compliance, advanced today  2.  Patient will report 75% improvement in neck pain to improve QOL.  Baseline: severe Goal status: IN PROGRESS- 12/19/22 65% improvement 01/31/2023- 70% improvement.   3.  Patient will demonstrate improved cervical ROM by 10 deg all directions for safety with driving.  Baseline: see objective Goal status: IN PROGRESS-01/31/23- decreased due to shoulder pain  4.  Patient will report 15% improvement on NDI to demonstrate improved functional ability.  Baseline: 46% Goal status: IN PROGRESS 01/31/23- 34%  5.  Patient will report 75% improvement in low back pain to improve quality of life. Baseline:   Goal status: MET- 11/27/22  60-70% ; 01/31/23- 75%  6. Patient will report 12% improvement on modified Oswestry to demonstrate improved functional ability. Baseline: 12/19/22 -30% Goal status: MET   01/31/23- 18% impairment.   7. Patient will will be able to return to community-based exercise program to maintain progress. Baseline: stopped exercising due to pain Goal status: IN PROGRESS    PLAN:  PT FREQUENCY: 1-2x/week  PT DURATION: 4 weeks  PLANNED INTERVENTIONS: Therapeutic exercises, Therapeutic activity, Neuromuscular re-education, Balance training, Gait training, Patient/Family education, Self Care, Joint mobilization, Stair training, Dry Needling, Electrical stimulation, Spinal mobilization, Cryotherapy, Moist heat, Taping, Traction, Ultrasound, Manual therapy, and Re-evaluation  PLAN FOR NEXT SESSION: continue to progress postural exercises as tolerated, manual therapy, modalities PRN.    Rennie Natter, PT, DPT  02/04/2023, 3:01 PM

## 2023-02-05 ENCOUNTER — Emergency Department (HOSPITAL_BASED_OUTPATIENT_CLINIC_OR_DEPARTMENT_OTHER): Payer: 59

## 2023-02-05 ENCOUNTER — Other Ambulatory Visit: Payer: Self-pay

## 2023-02-05 ENCOUNTER — Observation Stay (HOSPITAL_BASED_OUTPATIENT_CLINIC_OR_DEPARTMENT_OTHER)
Admission: EM | Admit: 2023-02-05 | Discharge: 2023-02-06 | Disposition: A | Discharge status: Home / Self Care | Payer: 59 | Attending: Internal Medicine | Admitting: Internal Medicine

## 2023-02-05 DIAGNOSIS — R7989 Other specified abnormal findings of blood chemistry: Secondary | ICD-10-CM | POA: Diagnosis not present

## 2023-02-05 DIAGNOSIS — G459 Transient cerebral ischemic attack, unspecified: Principal | ICD-10-CM | POA: Insufficient documentation

## 2023-02-05 DIAGNOSIS — N19 Unspecified kidney failure: Secondary | ICD-10-CM

## 2023-02-05 DIAGNOSIS — I2510 Atherosclerotic heart disease of native coronary artery without angina pectoris: Secondary | ICD-10-CM | POA: Diagnosis not present

## 2023-02-05 DIAGNOSIS — R200 Anesthesia of skin: Secondary | ICD-10-CM | POA: Diagnosis not present

## 2023-02-05 DIAGNOSIS — F419 Anxiety disorder, unspecified: Secondary | ICD-10-CM | POA: Diagnosis present

## 2023-02-05 DIAGNOSIS — K2100 Gastro-esophageal reflux disease with esophagitis, without bleeding: Secondary | ICD-10-CM

## 2023-02-05 DIAGNOSIS — Z7982 Long term (current) use of aspirin: Secondary | ICD-10-CM | POA: Insufficient documentation

## 2023-02-05 DIAGNOSIS — E782 Mixed hyperlipidemia: Secondary | ICD-10-CM

## 2023-02-05 DIAGNOSIS — H8110 Benign paroxysmal vertigo, unspecified ear: Secondary | ICD-10-CM | POA: Diagnosis present

## 2023-02-05 DIAGNOSIS — E890 Postprocedural hypothyroidism: Secondary | ICD-10-CM

## 2023-02-05 DIAGNOSIS — I63231 Cerebral infarction due to unspecified occlusion or stenosis of right carotid arteries: Secondary | ICD-10-CM | POA: Diagnosis not present

## 2023-02-05 DIAGNOSIS — Z955 Presence of coronary angioplasty implant and graft: Secondary | ICD-10-CM | POA: Insufficient documentation

## 2023-02-05 DIAGNOSIS — Z79899 Other long term (current) drug therapy: Secondary | ICD-10-CM | POA: Diagnosis not present

## 2023-02-05 DIAGNOSIS — I10 Essential (primary) hypertension: Secondary | ICD-10-CM | POA: Insufficient documentation

## 2023-02-05 DIAGNOSIS — Z7902 Long term (current) use of antithrombotics/antiplatelets: Secondary | ICD-10-CM | POA: Insufficient documentation

## 2023-02-05 DIAGNOSIS — E039 Hypothyroidism, unspecified: Secondary | ICD-10-CM | POA: Diagnosis not present

## 2023-02-05 DIAGNOSIS — K219 Gastro-esophageal reflux disease without esophagitis: Secondary | ICD-10-CM | POA: Diagnosis present

## 2023-02-05 DIAGNOSIS — E785 Hyperlipidemia, unspecified: Secondary | ICD-10-CM | POA: Diagnosis present

## 2023-02-05 DIAGNOSIS — I890 Lymphedema, not elsewhere classified: Secondary | ICD-10-CM | POA: Diagnosis not present

## 2023-02-05 DIAGNOSIS — R001 Bradycardia, unspecified: Secondary | ICD-10-CM

## 2023-02-05 LAB — APTT: aPTT: 27 seconds (ref 24–36)

## 2023-02-05 LAB — COMPREHENSIVE METABOLIC PANEL
ALT: 14 U/L (ref 0–44)
AST: 17 U/L (ref 15–41)
Albumin: 3.8 g/dL (ref 3.5–5.0)
Alkaline Phosphatase: 84 U/L (ref 38–126)
Anion gap: 10 (ref 5–15)
BUN: 25 mg/dL — ABNORMAL HIGH (ref 8–23)
CO2: 27 mmol/L (ref 22–32)
Calcium: 9 mg/dL (ref 8.9–10.3)
Chloride: 102 mmol/L (ref 98–111)
Creatinine, Ser: 1.12 mg/dL — ABNORMAL HIGH (ref 0.44–1.00)
GFR, Estimated: 51 mL/min — ABNORMAL LOW (ref >60)
Glucose, Bld: 105 mg/dL — ABNORMAL HIGH (ref 70–99)
Potassium: 4.3 mmol/L (ref 3.5–5.1)
Sodium: 139 mmol/L (ref 135–145)
Total Bilirubin: 0.6 mg/dL (ref 0.3–1.2)
Total Protein: 7.5 g/dL (ref 6.5–8.1)

## 2023-02-05 LAB — CBC
HCT: 43.4 % (ref 36.0–46.0)
HCT: 42.9 % (ref 36.0–46.0)
Hemoglobin: 14.3 g/dL (ref 12.0–15.0)
Hemoglobin: 14.1 g/dL (ref 12.0–15.0)
MCH: 28.8 pg (ref 26.0–34.0)
MCH: 29.1 pg (ref 26.0–34.0)
MCHC: 32.9 g/dL (ref 30.0–36.0)
MCHC: 32.9 g/dL (ref 30.0–36.0)
MCV: 87.5 fL (ref 80.0–100.0)
MCV: 88.5 fL (ref 80.0–100.0)
Platelets: 277 10*3/uL (ref 150–400)
Platelets: 257 10*3/uL (ref 150–400)
RBC: 4.96 MIL/uL (ref 3.87–5.11)
RBC: 4.85 MIL/uL (ref 3.87–5.11)
RDW: 14.1 % (ref 11.5–15.5)
RDW: 14 % (ref 11.5–15.5)
WBC: 9.9 10*3/uL (ref 4.0–10.5)
WBC: 9.9 10*3/uL (ref 4.0–10.5)
nRBC: 0 % (ref 0.0–0.2)
nRBC: 0 % (ref 0.0–0.2)

## 2023-02-05 LAB — TROPONIN I (HIGH SENSITIVITY)
Troponin I (High Sensitivity): 5 ng/L (ref <18)
Troponin I (High Sensitivity): 5 ng/L (ref <18)

## 2023-02-05 LAB — DIFFERENTIAL
Abs Immature Granulocytes: 0.06 10*3/uL (ref 0.00–0.07)
Basophils Absolute: 0.1 10*3/uL (ref 0.0–0.1)
Basophils Relative: 1 %
Eosinophils Absolute: 0.3 10*3/uL (ref 0.0–0.5)
Eosinophils Relative: 3 %
Immature Granulocytes: 1 %
Lymphocytes Relative: 31 %
Lymphs Abs: 3 10*3/uL (ref 0.7–4.0)
Monocytes Absolute: 0.8 10*3/uL (ref 0.1–1.0)
Monocytes Relative: 8 %
Neutro Abs: 5.6 10*3/uL (ref 1.7–7.7)
Neutrophils Relative %: 56 %

## 2023-02-05 LAB — GLUCOSE, CAPILLARY: Glucose-Capillary: 93 mg/dL (ref 70–99)

## 2023-02-05 LAB — PROTIME-INR
INR: 0.9 (ref 0.8–1.2)
Prothrombin Time: 12.3 seconds (ref 11.4–15.2)

## 2023-02-05 LAB — LIPID PANEL
Cholesterol: 200 mg/dL (ref 0–200)
HDL: 52 mg/dL (ref >40)
LDL Cholesterol: 112 mg/dL — ABNORMAL HIGH (ref 0–99)
Total CHOL/HDL Ratio: 3.8 RATIO
Triglycerides: 178 mg/dL — ABNORMAL HIGH (ref <150)
VLDL: 36 mg/dL (ref 0–40)

## 2023-02-05 LAB — ETHANOL: Alcohol, Ethyl (B): <10 mg/dL (ref <10)

## 2023-02-05 LAB — HEMOGLOBIN A1C
Hgb A1c MFr Bld: 5.4 % (ref 4.8–5.6)
Mean Plasma Glucose: 108.28 mg/dL

## 2023-02-05 LAB — CREATININE, SERUM
Creatinine, Ser: 1.2 mg/dL — ABNORMAL HIGH (ref 0.44–1.00)
GFR, Estimated: 47 mL/min — ABNORMAL LOW (ref >60)

## 2023-02-05 MED ORDER — SODIUM CHLORIDE 0.9% FLUSH
3.0000 mL | Freq: Once | INTRAVENOUS | Status: DC
  Filled 2023-02-05: qty 3

## 2023-02-05 MED ORDER — LEVOTHYROXINE SODIUM 112 MCG PO TABS
112.0000 ug | ORAL_TABLET | Freq: Every day | ORAL | Status: DC
  Administered 2023-02-06: 112 ug via ORAL
  Filled 2023-02-05 (×2): qty 1

## 2023-02-05 MED ORDER — CLOPIDOGREL BISULFATE 75 MG PO TABS
75.0000 mg | ORAL_TABLET | Freq: Every day | ORAL | Status: DC
  Administered 2023-02-05 – 2023-02-06 (×2): 75 mg via ORAL
  Filled 2023-02-05 (×2): qty 1

## 2023-02-05 MED ORDER — CALCITRIOL 0.25 MCG PO CAPS
0.7500 ug | ORAL_CAPSULE | Freq: Every day | ORAL | Status: DC
  Administered 2023-02-06: 0.75 ug via ORAL
  Filled 2023-02-05: qty 1
  Filled 2023-02-05: qty 3

## 2023-02-05 MED ORDER — ACETAMINOPHEN 650 MG RE SUPP: 650.0000 mg | RECTAL | Status: DC | PRN

## 2023-02-05 MED ORDER — ACETAMINOPHEN 325 MG PO TABS: 650.0000 mg | ORAL_TABLET | ORAL | Status: DC | PRN

## 2023-02-05 MED ORDER — ENOXAPARIN SODIUM 40 MG/0.4ML IJ SOSY
40.0000 mg | PREFILLED_SYRINGE | INTRAMUSCULAR | Status: DC
  Administered 2023-02-05: 40 mg via SUBCUTANEOUS
  Filled 2023-02-05: qty 0.4

## 2023-02-05 MED ORDER — ASPIRIN 81 MG PO TBEC: 81.0000 mg | DELAYED_RELEASE_TABLET | Freq: Every day | ORAL | Status: DC

## 2023-02-05 MED ORDER — STROKE: EARLY STAGES OF RECOVERY BOOK
Freq: Once | Status: DC
  Filled 2023-02-05: qty 1

## 2023-02-05 MED ORDER — ASPIRIN 325 MG PO TABS
325.0000 mg | ORAL_TABLET | Freq: Once | ORAL | Status: AC
  Administered 2023-02-05: 325 mg via ORAL
  Filled 2023-02-05: qty 1

## 2023-02-05 MED ORDER — LORAZEPAM 2 MG/ML IJ SOLN
1.0000 mg | Freq: Once | INTRAMUSCULAR | Status: AC
  Administered 2023-02-06: 1 mg via INTRAVENOUS
  Filled 2023-02-05: qty 1

## 2023-02-05 MED ORDER — ASPIRIN 81 MG PO TBEC
81.0000 mg | DELAYED_RELEASE_TABLET | Freq: Every day | ORAL | Status: DC
  Administered 2023-02-06: 81 mg via ORAL
  Filled 2023-02-05: qty 1

## 2023-02-05 MED ORDER — ROSUVASTATIN CALCIUM 20 MG PO TABS
40.0000 mg | ORAL_TABLET | Freq: Every day | ORAL | Status: DC
  Administered 2023-02-06: 40 mg via ORAL
  Filled 2023-02-05: qty 2
  Filled 2023-02-05: qty 1

## 2023-02-05 MED ORDER — ACETAMINOPHEN 160 MG/5ML PO SOLN: 650.0000 mg | ORAL | Status: DC | PRN

## 2023-02-05 MED ORDER — SODIUM CHLORIDE 0.9 % IV SOLN: INTRAVENOUS | Status: DC

## 2023-02-05 NOTE — Progress Notes (Addendum)
This is a 77 year old female PMHx CAD - s/p with DESx2 in 2012, HTN, and HLD.  Yesterday around 3 PM she developed left-sided facial numbness that lasted approximately 1 hour.  Today he developed the same symptoms, it lasted from 10 AM to 5:30 PM.  He denied any other symptomatology, including localized weakness, slurred speech or confusion.  By the time he arrived in the ER he was asymptomatic.  Presenting blood pressure 187/65, heart rate 55, with HR as low as 45.  EKG w/o change RBBB CT head normal. Lipid panel ordered and pending.     EDP discussed patient  with Dr. Quinn Axe  teleneuro who recommended observation, MRI given patient's risk factors

## 2023-02-05 NOTE — ED Provider Notes (Addendum)
[facility] [place] Provider Note   CSN: [id number] Arrival date & time: 02/05/23  1358     History  Chief Complaint  Patient presents with   Numbness    [name] is a 77 y.o. female.  Patient with 2 episodes of left facial numbness.  First episode occurred yesterday at about 1500 and lasted for an hour.  Reoccurred this morning after being awake for a while at 1000 in the morning and lasted until 1730.  All symptoms have resolved.  Patient is never had anything like this before.  Patient has significant risk factors.  History of hyperlipidemia hypertensive heart disease coronary artery disease with stents.  Patient is on Plavix.  Patient never used tobacco products.  Patient denies any speech problems visual problems any numbness in the upper extremities or lower extremities and also no weakness.  Did not have any facial asymmetry.  All symptoms have resolved now.       Home Medications Prior to Admission medications   Medication Sig Start Date End Date Taking? Authorizing Provider  amLODipine (NORVASC) 10 MG tablet Take 1 tablet (10 mg total) by mouth daily. 01/14/23   Fay Records, MD  aspirin EC 81 MG tablet Take 81 mg by mouth daily. 11/22/11   Dunn, Dayna N, PA-C  B Complex-C (B-COMPLEX WITH VITAMIN C) tablet Take 1 tablet by mouth daily.    [provider]  bisoprolol (ZEBETA) 5 MG tablet Take 1 tablet (5 mg total) by mouth daily. 10/11/22   Fay Records, MD  calcitRIOL (ROCALTROL) 0.25 MCG capsule TAKE 3 CAPSULES BY MOUTH DAILY 12/31/22   Fay Records, MD  Cholecalciferol (VITAMIN D) 125 MCG (5000 UT) CAPS Take 5,000 Units by mouth daily in the afternoon. 01/17/23   Fay Records, MD  ciclopirox [facility]) 8 % solution Apply topically at bedtime. Apply over nail and surrounding skin. Apply daily over previous coat. After seven (7) days, may remove with alcohol and continue cycle. 12/27/22   Standiford, Nena Alexander, DPM   clopidogrel (PLAVIX) 75 MG tablet Take 1 tablet (75 mg total) by mouth daily. 10/11/22   Fay Records, MD  diazepam (VALIUM) 5 MG tablet Take 1 tablet (5 mg total) by mouth daily as needed for anxiety (MRI). Take 1 one hour prior to MRI and another when arrive 08/21/22   Mosie Lukes, MD  furosemide (LASIX) 20 MG tablet 1 tab po daily prn severe pedal edema 10/29/22   Saguier, Percell Miller, PA-C  isosorbide mononitrate (IMDUR) 60 MG 24 hr tablet Take 1 tablet (60 mg total) by mouth daily. 10/11/22   Fay Records, MD  levothyroxine (SYNTHROID) 112 MCG tablet TAKE 1 TABLET BY MOUTH DAILY  BEFORE BREAKFAST 08/17/22   Mosie Lukes, MD  Multiple Vitamin (MULTIVITAMIN WITH MINERALS) TABS tablet Take 1 tablet by mouth daily.    [provider]  nitroGLYCERIN (NITROSTAT) 0.4 MG SL tablet Place 1 tablet (0.4 mg total) under the tongue every 5 (five) minutes as needed for chest pain. 01/14/23   Fay Records, MD  oxyCODONE (ROXICODONE) 5 MG immediate release tablet Take 1 tablet (5 mg total) by mouth every 6 (six) hours as needed for up to 20 doses for breakthrough pain. 07/24/22   Curatolo, Adam, DO  pantoprazole (PROTONIX) 40 MG tablet TAKE 1 TABLET BY MOUTH  DAILY 07/09/22   Mosie Lukes, MD  REPATHA SURECLICK [id number] MG/ML SOAJ INJECT 140MG SUBCUTANEOUSLY EVERY  2 WEEKS 04/20/22   Fay Records, MD  rosuvastatin (CRESTOR) 40 MG tablet TAKE 1 TABLET BY MOUTH DAILY 07/09/22   Fay Records, MD  urea (CARMOL) 10 % cream Apply topically as needed. 12/27/22   Standiford, Nena Alexander, DPM      Allergies    Zetia [ezetimibe] and Penicillin g    Review of Systems   Review of Systems  Constitutional:  Negative for chills and fever.  HENT:  Negative for ear pain and sore throat.   Eyes:  Negative for pain and visual disturbance.  Respiratory:  Negative for cough and shortness of breath.   Cardiovascular:  Negative for chest pain and palpitations.  Gastrointestinal:  Negative for abdominal pain and vomiting.   Genitourinary:  Negative for dysuria and hematuria.  Musculoskeletal:  Negative for arthralgias and back pain.  Skin:  Negative for color change and rash.  Neurological:  Positive for numbness. Negative for dizziness, seizures, syncope, facial asymmetry, speech difficulty, weakness, light-headedness and headaches.  All other systems reviewed and are negative.   Physical Exam Updated Vital Signs BP (!) 159/69   Pulse (!) 46   Temp 97.6 F (36.4 C) (Oral)   Resp 18   Ht 1.651 m (5' 5"$ )   Wt 86.2 kg   SpO2 97%   BMI 31.62 kg/m  Physical Exam Vitals and nursing note reviewed.  Constitutional:      General: She is not in acute distress.    Appearance: Normal appearance. She is well-developed.  HENT:     Head: Normocephalic and atraumatic.  Eyes:     Extraocular Movements: Extraocular movements intact.     Conjunctiva/sclera: Conjunctivae normal.     Pupils: Pupils are equal, round, and reactive to light.  Cardiovascular:     Rate and Rhythm: Normal rate and regular rhythm.     Heart sounds: No murmur heard. Pulmonary:     Effort: Pulmonary effort is normal. No respiratory distress.     Breath sounds: Normal breath sounds.  Abdominal:     Palpations: Abdomen is soft.     Tenderness: There is no abdominal tenderness.  Musculoskeletal:        General: No swelling.     Cervical back: Normal range of motion and neck supple.  Skin:    General: Skin is warm and dry.     Capillary Refill: Capillary refill takes less than 2 seconds.  Neurological:     General: No focal deficit present.     Mental Status: She is alert and oriented to person, place, and time.     Cranial Nerves: No cranial nerve deficit.     Sensory: No sensory deficit.     Motor: No weakness.  Psychiatric:        Mood and Affect: Mood normal.     ED Results / Procedures / Treatments   Labs (all labs ordered are listed, but only abnormal results are displayed) Labs Reviewed  COMPREHENSIVE METABOLIC PANEL  - Abnormal; Notable for the following components:      Result Value   Glucose, Bld 105 (*)    BUN 25 (*)    Creatinine, Ser 1.12 (*)    GFR, Estimated 51 (*)    All other components within normal limits  PROTIME-INR  APTT  CBC  DIFFERENTIAL  ETHANOL  GLUCOSE, CAPILLARY  CBG MONITORING, ED  TROPONIN I (HIGH SENSITIVITY)  TROPONIN I (HIGH SENSITIVITY)    EKG EKG Interpretation  Date/Time:  Tuesday February 05 2023 14:14:00 EST Ventricular Rate:  53 PR Interval:  188 QRS Duration: 153 QT Interval:  451 QTC Calculation: 424 R Axis:   37 Text Interpretation: Sinus rhythm Ventricular premature complex Right bundle branch block No significant change since last tracing Confirmed by Fredia Sorrow [phone]) on 02/05/2023 6:19:17 PM  Radiology CT HEAD WO CONTRAST  Result Date: 02/05/2023 CLINICAL DATA:  Neuro deficit, acute, stroke suspected. LEFT-sided facial numbness beginning last night and worsening this morning. EXAM: CT HEAD WITHOUT CONTRAST TECHNIQUE: Contiguous axial images were obtained from the base of the skull through the vertex without intravenous contrast. RADIATION DOSE REDUCTION: This exam was performed according to the departmental dose-optimization program which includes automated exposure control, adjustment of the mA and/or kV according to patient size and/or use of iterative reconstruction technique. COMPARISON:  Head CT dated 12/21/2011. FINDINGS: Brain: Ventricles are stable in size and configuration. Mild chronic small vessel ischemic changes noted within the bilateral periventricular white matter regions. There is no mass, hemorrhage, edema or other evidence of acute parenchymal abnormality. No extra-axial hemorrhage. Vascular: Chronic calcified atherosclerotic changes of the large vessels at the skull base. No unexpected hyperdense vessel. Skull: Normal. Negative for fracture or focal lesion. Sinuses/Orbits: No acute finding. Other: None. IMPRESSION: 1. No acute  findings. No intracranial mass, hemorrhage or edema. 2. Mild chronic small vessel ischemic changes in the periventricular white matter. Electronically Signed   By: Franki Cabot M.D.   On: 02/05/2023 15:06    Procedures Procedures    Medications Ordered in ED Medications - No data to display  ED Course/ Medical Decision Making/ A&P                             Medical Decision Making Risk Decision regarding hospitalization.   Patient without any chest pain.  But troponins x 2 normal.  Glucose 93 INR 0.9.  CBC no leukocytosis hemoglobin 14.3 platelets 277.  Complete metabolic panel significant for BUN of 25 creatinine 1.12 for GFR 51.  Not significantly different than previous.  Alcohol less than 10.  CT head no acute findings.  Mild chronic small vessel ischemic changes in the periventricular white matter.  Patient's symptoms sound like possible TIA.  Will discuss with the teleneurologist.  Suspect patient may need to be sent in for MRI.  Patient does not have a pacemaker.  Or any restriction to MRI.  Discussed with Dr. Quinn Axe telemetry neurologist she is recommending due to her risk factors just to go ahead and get her admitted to the hospitalist service and get the MRI during the admission.  Will discuss with hospitalist.  Final Clinical Impression(s) / ED Diagnoses Final diagnoses:  TIA (transient ischemic attack)    Rx / DC Orders ED Discharge Orders     None         Fredia Sorrow, MD 02/05/23 1851    Fredia Sorrow, MD 02/05/23 [phone]

## 2023-02-05 NOTE — H&P (Signed)
History and Physical      PADEE WADEL [id number] DOB: 11-19-1946 DOA: 02/05/2023  PCP: Mosie Lukes, MD *** Patient coming from: home ***  I have personally briefly reviewed patient's old medical records in [place]  Chief Complaint: ***  HPI: [name] is a 77 y.o. female with medical history significant for *** who is admitted to [facility] on 02/05/2023 with *** after presenting from home*** to [facility] ED complaining of ***.    ***       ***   ED Course:  Vital signs in the ED were notable for the following: ***  Labs were notable for the following: ***  Per my interpretation, EKG in ED demonstrated the following:  ***  Imaging and additional notable ED work-up: ***  While in the ED, the following were administered: ***  Subsequently, the patient was admitted  ***  ***red    Review of Systems: As per HPI otherwise 10 point review of systems negative.   Past Medical History:  Diagnosis Date   Acute MI, subendocardial ([place])    Arthritis    Back pain 05/31/2013   Benign paroxysmal positional vertigo 08/05/2016   CAD (coronary artery disease)    a. NSTEMI 10/2011: [place] 11/19/11: pLAD 30%, oOM 60%, AVCFX 30%, CFX after OM2 30%, pRCA 60 and 70%, then 99%, AM 80-90% with TIMI 3 flow.  PCI: Promus DES x 2 to RCA; b. 06/2012 Cath: patent RCA stents w/ subtl occl of Acute Marginal (jailed)->Med rx; c. 05/2015 MV: EF 59%, mod mid infsept/inf/ap lat/ap infarct with peri-infarct isch-->Med Rx; d. 02/2016 Cath: LM nl, LAD 15m RI 50, RCA patent stents.   Colitis    Facial skin lesion 01/28/2017   GERD (gastroesophageal reflux disease)    occasional   Hyperlipidemia    Hypertension    Hypertensive heart disease    a. Echocardiogram 11/19/11: Difficult acoustic windows, EF 60-65%, normal LV wall thickness, grade 1 diastolic dysfunction   Hypoparathyroidism (HCC)    Low zinc level 11/26/2016   Multinodular thyroid    Goiter s/p thyroidectomy in  2007 with post-op  hypocalcemia and post-op hypothyroidism/hypoparathyroidism with hypocalcemia   Neck pain on right side 07/13/2013   Nocturia 05/31/2013   Osteoarthritis    Pain in right axilla 03/13/2016   Palpitations    a. 03/2012 - patient set up for event monitor but did not wear correctly -  she declined wearing a repeat monitor   Post-surgical hypothyroidism    Sun-damaged skin 12/05/2014   Tubular adenoma of colon 06/2011   Unspecified constipation 05/31/2013   Vertigo    Zinc deficiency 11/26/2015    Past Surgical History:  Procedure Laterality Date   CARDIAC CATHETERIZATION N/A 03/08/2016   Procedure: Left Heart Cath and Coronary Angiography;  Surgeon: JJettie Booze MD;  [facility];  Service: Cardiovascular;  Laterality: N/A;   COLONOSCOPY  Aenomatous polyps   07/05/2011   COLONOSCOPY N/A 09/28/2014   Procedure: COLONOSCOPY;  Surgeon: MLadene Artist MD;  Location: [facility];  Service: Endoscopy;  Laterality: N/A;   CORONARY ANGIOPLASTY WITH STENT PLACEMENT     LEFT HEART CATH AND CORONARY ANGIOGRAPHY N/A 12/09/2018   Procedure: LEFT HEART CATH AND CORONARY ANGIOGRAPHY;  Surgeon: VJettie Booze MD;  [facility];  Service: Cardiovascular;  Laterality: N/A;   LEFT HEART CATHETERIZATION WITH CORONARY ANGIOGRAM N/A 07/17/2012   Procedure: LEFT HEART CATHETERIZATION WITH CORONARY ANGIOGRAM;  Surgeon:  Hillary Bow, MD;  Location[facility] CATH LAB;  Service: Cardiovascular;  Laterality: N/A;   PERCUTANEOUS CORONARY STENT INTERVENTION (PCI-S) N/A 11/19/2011   Procedure: PERCUTANEOUS CORONARY STENT INTERVENTION (PCI-S);  Surgeon: Hillary Bow, MD;  Location: [facility] CATH LAB;  Service: Cardiovascular;  Laterality: N/A;   POLYPECTOMY     SHOULDER ARTHROSCOPY W/ ROTATOR CUFF REPAIR     right   TOTAL THYROIDECTOMY  2007   GOITER    Social History:  reports that she has never smoked. She has never used smokeless tobacco. She reports that she  does not currently use alcohol. She reports that she does not use drugs.   Allergies  Allergen Reactions   Zetia [Ezetimibe] Other (See Comments)    Myalgia, paresthesias and weakness   Penicillin G Rash    Family History  Problem Relation Age of Onset   Colon cancer Brother    Cancer Brother        COLON   Hypertension Father    Heart disease Father    Heart attack Father    Blindness Sister    Congestive Heart Failure Sister    Hypertension Sister    Healthy Daughter    Healthy Son    Thyroid disease Sister    Cancer Brother        multiple myelomas   Healthy Daughter    Healthy Daughter    Diabetes Neg Hx    Prostate cancer Neg Hx    Breast cancer Neg Hx    Esophageal cancer Neg Hx    Rectal cancer Neg Hx    Stomach cancer Neg Hx     Family history reviewed and not pertinent ***   Prior to Admission medications   Medication Sig Start Date End Date Taking? Authorizing Provider  amLODipine (NORVASC) 10 MG tablet Take 1 tablet (10 mg total) by mouth daily. 01/14/23   Fay Records, MD  aspirin EC 81 MG tablet Take 81 mg by mouth daily. 11/22/11   Dunn, Dayna N, PA-C  B Complex-C (B-COMPLEX WITH VITAMIN C) tablet Take 1 tablet by mouth daily.    [provider]  bisoprolol (ZEBETA) 5 MG tablet Take 1 tablet (5 mg total) by mouth daily. 10/11/22   Fay Records, MD  calcitRIOL (ROCALTROL) 0.25 MCG capsule TAKE 3 CAPSULES BY MOUTH DAILY 12/31/22   Fay Records, MD  Cholecalciferol (VITAMIN D) 125 MCG (5000 UT) CAPS Take 5,000 Units by mouth daily in the afternoon. 01/17/23   Fay Records, MD  ciclopirox [facility]) 8 % solution Apply topically at bedtime. Apply over nail and surrounding skin. Apply daily over previous coat. After seven (7) days, may remove with alcohol and continue cycle. 12/27/22   Standiford, Nena Alexander, DPM  clopidogrel (PLAVIX) 75 MG tablet Take 1 tablet (75 mg total) by mouth daily. 10/11/22   Fay Records, MD  diazepam (VALIUM) 5 MG tablet Take  1 tablet (5 mg total) by mouth daily as needed for anxiety (MRI). Take 1 one hour prior to MRI and another when arrive 08/21/22   Mosie Lukes, MD  furosemide (LASIX) 20 MG tablet 1 tab po daily prn severe pedal edema 10/29/22   Saguier, Percell Miller, PA-C  isosorbide mononitrate (IMDUR) 60 MG 24 hr tablet Take 1 tablet (60 mg total) by mouth daily. 10/11/22   Fay Records, MD  levothyroxine (SYNTHROID) 112 MCG tablet TAKE 1 TABLET BY MOUTH DAILY  BEFORE BREAKFAST 08/17/22   Mosie Lukes,  MD  Multiple Vitamin (MULTIVITAMIN WITH MINERALS) TABS tablet Take 1 tablet by mouth daily.    [provider]  nitroGLYCERIN (NITROSTAT) 0.4 MG SL tablet Place 1 tablet (0.4 mg total) under the tongue every 5 (five) minutes as needed for chest pain. 01/14/23   Fay Records, MD  oxyCODONE (ROXICODONE) 5 MG immediate release tablet Take 1 tablet (5 mg total) by mouth every 6 (six) hours as needed for up to 20 doses for breakthrough pain. 07/24/22   Curatolo, Adam, DO  pantoprazole (PROTONIX) 40 MG tablet TAKE 1 TABLET BY MOUTH  DAILY 07/09/22   Mosie Lukes, MD  REPATHA SURECLICK [id number] MG/ML SOAJ INJECT 140MG SUBCUTANEOUSLY EVERY 2 WEEKS 04/20/22   Fay Records, MD  rosuvastatin (CRESTOR) 40 MG tablet TAKE 1 TABLET BY MOUTH DAILY 07/09/22   Fay Records, MD  urea (CARMOL) 10 % cream Apply topically as needed. 12/27/22   StandifordNena Alexander, DPM     Objective    Physical Exam: Vitals:   02/05/23 2131 02/05/23 2132 02/05/23 2133 02/05/23 2245  BP:    (!) 142/64  Pulse: (!) 57 (!) 54 (!) 52 (!) 52  Resp:    17  Temp:    98 F (36.7 C)  TempSrc:    Oral  SpO2: 98% 97% 98% 95%  Weight:      Height:        General: appears to be stated age; alert, oriented Skin: warm, dry, no rash Head:  AT/[place] Mouth:  Oral mucosa membranes appear moist, normal dentition Neck: supple; trachea midline Heart:  RRR; did not appreciate any M/R/G Lungs: CTAB, did not appreciate any wheezes, rales, or rhonchi Abdomen:  + BS; soft, ND, NT Vascular: 2+ pedal pulses b/l; 2+ radial pulses b/l Extremities: no peripheral edema, no muscle wasting Neuro: strength and sensation intact in upper and lower extremities b/l ***   *** Neuro: 5/5 strength of the proximal and distal flexors and extensors of the upper and lower extremities bilaterally; sensation intact in upper and lower extremities b/l; cranial nerves II through XII grossly intact; no pronator drift; no evidence suggestive of slurred speech, dysarthria, or facial droop; Normal muscle tone. No tremors.  *** Neuro: In the setting of the patient's current mental status and associated inability to follow instructions, unable to perform full neurologic exam at this time.  As such, assessment of strength, sensation, and cranial nerves is limited at this time. Patient noted to spontaneously move all 4 extremities. No tremors.  ***    Labs on Admission: I have personally reviewed following labs and imaging studies  CBC: Recent Labs  Lab 02/05/23 1425 02/05/23 2259  WBC 9.9 9.9  NEUTROABS 5.6  --   HGB 14.3 14.1  HCT 43.4 42.9  MCV 87.5 88.5  PLT 277 [id number]   Basic Metabolic Panel: Recent Labs  Lab 02/05/23 1425  NA 139  K 4.3  CL 102  CO2 27  GLUCOSE 105*  BUN 25*  CREATININE 1.12*  CALCIUM 9.0   GFR: Estimated Creatinine Clearance: 47.1 mL/min (A) (by C-G formula based on SCr of 1.12 mg/dL (H)). Liver Function Tests: Recent Labs  Lab 02/05/23 1425  AST 17  ALT 14  ALKPHOS 84  BILITOT 0.6  PROT 7.5  ALBUMIN 3.8   No results for input(s): "LIPASE", "AMYLASE" in the last 168 hours. No results for input(s): "AMMONIA" in the last 168 hours. Coagulation Profile: Recent Labs  Lab 02/05/23 1425  INR 0.9  Cardiac Enzymes: No results for input(s): "CKTOTAL", "CKMB", "CKMBINDEX", "TROPONINI" in the last 168 hours. BNP (last 3 results) Recent Labs    10/29/22 1547  PROBNP 120.0*   HbA1C: Recent Labs    02/05/23 2259  HGBA1C  5.4   CBG: Recent Labs  Lab 02/05/23 1428  GLUCAP 93   Lipid Profile: No results for input(s): "CHOL", "HDL", "LDLCALC", "TRIG", "CHOLHDL", "LDLDIRECT" in the last 72 hours. Thyroid Function Tests: No results for input(s): "TSH", "T4TOTAL", "FREET4", "T3FREE", "THYROIDAB" in the last 72 hours. Anemia Panel: No results for input(s): "VITAMINB12", "FOLATE", "FERRITIN", "TIBC", "IRON", "RETICCTPCT" in the last 72 hours. Urine analysis:    Component Value Date/Time   COLORURINE YELLOW 07/24/2022 [place] 07/24/2022 1542   LABSPEC 1.008 07/24/2022 1542   PHURINE 7.0 07/24/2022 1542   GLUCOSEU NEGATIVE 07/24/2022 1542   GLUCOSEU NEGATIVE 12/28/2020 1020   HGBUR NEGATIVE 07/24/2022 1542   BILIRUBINUR NEGATIVE 07/24/2022 1542   BILIRUBINUR negative 03/29/2014 1420   KETONESUR NEGATIVE 07/24/2022 1542   PROTEINUR NEGATIVE 07/24/2022 1542   UROBILINOGEN 0.2 12/28/2020 1020   NITRITE NEGATIVE 07/24/2022 1542   LEUKOCYTESUR LARGE (A) 07/24/2022 1542    Radiological Exams on Admission: CT HEAD WO CONTRAST  Result Date: 02/05/2023 CLINICAL DATA:  Neuro deficit, acute, stroke suspected. LEFT-sided facial numbness beginning last night and worsening this morning. EXAM: CT HEAD WITHOUT CONTRAST TECHNIQUE: Contiguous axial images were obtained from the base of the skull through the vertex without intravenous contrast. RADIATION DOSE REDUCTION: This exam was performed according to the departmental dose-optimization program which includes automated exposure control, adjustment of the mA and/or kV according to patient size and/or use of iterative reconstruction technique. COMPARISON:  Head CT dated 12/21/2011. FINDINGS: Brain: Ventricles are stable in size and configuration. Mild chronic small vessel ischemic changes noted within the bilateral periventricular white matter regions. There is no mass, hemorrhage, edema or other evidence of acute parenchymal abnormality. No extra-axial  hemorrhage. Vascular: Chronic calcified atherosclerotic changes of the large vessels at the skull base. No unexpected hyperdense vessel. Skull: Normal. Negative for fracture or focal lesion. Sinuses/Orbits: No acute finding. Other: None. IMPRESSION: 1. No acute findings. No intracranial mass, hemorrhage or edema. 2. Mild chronic small vessel ischemic changes in the periventricular white matter. Electronically Signed   By: Franki Cabot M.D.   On: 02/05/2023 15:06      Assessment/Plan   Principal Problem:   TIA (transient ischemic attack) Active Problems:   Hypothyroidism   Hyperlipidemia   Essential hypertension, benign   CAD, NATIVE VESSEL   Benign paroxysmal positional vertigo   Anxiety   ***       ***            ***             ***            ***            ***            ***             ***           ***           ***   ***  DVT prophylaxis: SCD's ***  Code Status: Full code*** Family Communication: none*** Disposition Plan: Per Rounding Team Consults called: none***;  Admission status: ***     I SPENT GREATER THAN 75 *** MINUTES IN CLINICAL CARE TIME/MEDICAL DECISION-MAKING IN COMPLETING  THIS ADMISSION.      [place] DO [facility]  From [place]   02/05/2023, 11:18 PM   ***

## 2023-02-05 NOTE — ED Notes (Signed)
-  Call [facility] at 757pm & set up transportation to [facility] 5W21.

## 2023-02-05 NOTE — ED Notes (Signed)
Rogene Houston, MD consulting with Neurology via telephone

## 2023-02-05 NOTE — ED Triage Notes (Signed)
Pt reports left side facial numbness that began last night and reports worsening this morning around 9am. Pt denies headache or weakness. Pt reports this is her only symptom. Pt face symmetrical and grips strong and equal. Pt hypertensive in triage and reports taking medication as prescribed.

## 2023-02-06 ENCOUNTER — Observation Stay (HOSPITAL_COMMUNITY): Payer: 59

## 2023-02-06 ENCOUNTER — Observation Stay (HOSPITAL_BASED_OUTPATIENT_CLINIC_OR_DEPARTMENT_OTHER): Payer: 59

## 2023-02-06 ENCOUNTER — Encounter (HOSPITAL_COMMUNITY): Payer: Self-pay | Admitting: Family Medicine

## 2023-02-06 DIAGNOSIS — G459 Transient cerebral ischemic attack, unspecified: Secondary | ICD-10-CM

## 2023-02-06 DIAGNOSIS — R200 Anesthesia of skin: Secondary | ICD-10-CM | POA: Diagnosis not present

## 2023-02-06 DIAGNOSIS — R2990 Unspecified symptoms and signs involving the nervous system: Secondary | ICD-10-CM | POA: Diagnosis not present

## 2023-02-06 DIAGNOSIS — N19 Unspecified kidney failure: Secondary | ICD-10-CM | POA: Diagnosis present

## 2023-02-06 DIAGNOSIS — I6521 Occlusion and stenosis of right carotid artery: Secondary | ICD-10-CM | POA: Diagnosis not present

## 2023-02-06 LAB — CBC
HCT: 42 % (ref 36.0–46.0)
Hemoglobin: 14.2 g/dL (ref 12.0–15.0)
MCH: 29.2 pg (ref 26.0–34.0)
MCHC: 33.8 g/dL (ref 30.0–36.0)
MCV: 86.4 fL (ref 80.0–100.0)
Platelets: 238 10*3/uL (ref 150–400)
RBC: 4.86 MIL/uL (ref 3.87–5.11)
RDW: 13.8 % (ref 11.5–15.5)
WBC: 10.5 10*3/uL (ref 4.0–10.5)
nRBC: 0 % (ref 0.0–0.2)

## 2023-02-06 LAB — COMPREHENSIVE METABOLIC PANEL
ALT: 13 U/L (ref 0–44)
AST: 16 U/L (ref 15–41)
Albumin: 3.4 g/dL — ABNORMAL LOW (ref 3.5–5.0)
Alkaline Phosphatase: 75 U/L (ref 38–126)
Anion gap: 12 (ref 5–15)
BUN: 20 mg/dL (ref 8–23)
CO2: 27 mmol/L (ref 22–32)
Calcium: 8.4 mg/dL — ABNORMAL LOW (ref 8.9–10.3)
Chloride: 100 mmol/L (ref 98–111)
Creatinine, Ser: 1.12 mg/dL — ABNORMAL HIGH (ref 0.44–1.00)
GFR, Estimated: 51 mL/min — ABNORMAL LOW (ref >60)
Glucose, Bld: 99 mg/dL (ref 70–99)
Potassium: 3.5 mmol/L (ref 3.5–5.1)
Sodium: 139 mmol/L (ref 135–145)
Total Bilirubin: 0.4 mg/dL (ref 0.3–1.2)
Total Protein: 6.6 g/dL (ref 6.5–8.1)

## 2023-02-06 LAB — ECHOCARDIOGRAM COMPLETE
AR max vel: 2.76 cm2
AV Area VTI: 2.64 cm2
AV Area mean vel: 2.44 cm2
AV Mean grad: 4 mmHg
AV Peak grad: 7.1 mmHg
Ao pk vel: 1.33 m/s
Area-P 1/2: 3.13 cm2
Calc EF: 64.5 %
Height: 65 in
S' Lateral: 2.3 cm
Single Plane A2C EF: 63.9 %
Single Plane A4C EF: 63.2 %
Weight: 3040 oz

## 2023-02-06 LAB — TSH: TSH: 7.578 u[IU]/mL — ABNORMAL HIGH (ref 0.350–4.500)

## 2023-02-06 LAB — MAGNESIUM: Magnesium: 2 mg/dL (ref 1.7–2.4)

## 2023-02-06 MED ORDER — LEVOTHYROXINE SODIUM 125 MCG PO TABS: 125.0000 ug | ORAL_TABLET | Freq: Every day | ORAL | 1 refills | Status: DC

## 2023-02-06 MED ORDER — HYDRALAZINE HCL 20 MG/ML IJ SOLN: 10.0000 mg | INTRAMUSCULAR | Status: DC | PRN

## 2023-02-06 MED ORDER — IOHEXOL 350 MG/ML SOLN
75.0000 mL | Freq: Once | INTRAVENOUS | Status: AC | PRN
  Administered 2023-02-06: 75 mL via INTRAVENOUS

## 2023-02-06 MED ORDER — PANTOPRAZOLE SODIUM 40 MG PO TBEC
40.0000 mg | DELAYED_RELEASE_TABLET | Freq: Every day | ORAL | Status: DC
  Administered 2023-02-06: 40 mg via ORAL
  Filled 2023-02-06: qty 1

## 2023-02-06 NOTE — Progress Notes (Signed)
Echocardiogram 2D Echocardiogram has been performed.  Frances Furbish 02/06/2023, 10:41 AM

## 2023-02-06 NOTE — Plan of Care (Signed)
Discharge instructions discussed with patient by bedside RN [name].  Patient instructed on home medications, restrictions, and follow up appointments. Belongings gathered and sent with patient.  Patients medications sent to pharmacy of her choice.

## 2023-02-06 NOTE — Progress Notes (Signed)
SLP Cancellation Note  Patient Details Name: [name] MRN: [id number] DOB: [birth year]   Cancelled treatment:       Reason Eval/Treat Not Completed: SLP screened, no needs identified, will sign off  Taylor Rainwater MA, CCC-SLP  [name] Meryl 02/06/2023, 11:21 AM

## 2023-02-06 NOTE — Progress Notes (Signed)
EEG complete - results pending 

## 2023-02-06 NOTE — Procedures (Signed)
Patient Name: [name]  MRN: [id number]  Epilepsy Attending: Lora Havens  Referring Physician/Provider: Janine Ores, NP  Date: 02/06/2023 Duration: 21.29 mins  Patient history:  77 y.o. female with PMH significant for GERD, HTN, HLD, CAD who presents with 2 episodes of left facial numbness that self resolved. EEG to evaluate for seizure.  Level of alertness: Awake  AEDs during EEG study: None  Technical aspects: This EEG study was done with scalp electrodes positioned according to the 10-20 International system of electrode placement. Electrical activity was reviewed with band pass filter of 1-70Hz$ , sensitivity of 7 uV/mm, display speed of 63m/sec with a 60Hz$  notched filter applied as appropriate. EEG data were recorded continuously and digitally stored.  Video monitoring was available and reviewed as appropriate.  Description: The posterior dominant rhythm consists of 8-9 Hz activity of moderate voltage (25-35 uV) seen predominantly in posterior head regions, symmetric and reactive to eye opening and eye closing. Hyperventilation and photic stimulation were not performed.     IMPRESSION: This study is within normal limits. No seizures or epileptiform discharges were seen throughout the recording.  A normal interictal EEG does not exclude  the diagnosis of epilepsy.  Zoraya Fiorenza OBarbra Sarks

## 2023-02-06 NOTE — Discharge Summary (Signed)
PATIENT DETAILS Name: [name] Age: 77 y.o. Sex: female Date of Birth: 30-May-1946 MRN: [id number]. Admitting Physician: Quintella Baton, MD [id number], Bonnita Levan, MD  Admit Date: 02/05/2023 Discharge date: 02/06/2023  Recommendations for Outpatient Follow-up:  Follow up with PCP in 1-2 weeks Please obtain CMP/CBC in one week  Admitted From:  Home  Disposition: Home   Discharge Condition: good  CODE STATUS:   Code Status: Full Code   Diet recommendation:  Diet Order             Diet - low sodium heart healthy           Diet regular Room service appropriate? Yes; Fluid consistency: Thin  Diet effective now                    Brief Summary: 77 year old with history of CAD s/p PCI 2012-on dual antiplatelets, HLD, HTN-presented to the hospital with 2 episodes of left-sided facial numbness  Brief Hospital Course: Left-sided facial numbness Clear if this was a TIA or other etiologies MRI brain negative for CVA CTA head/neck showed no major stenosis Discussed with neurologist-Dr. Sethi-continue aspirin/Plavix/statin on discharge.  Low suspicion for TIA  50% stenosis of right ICA Continue medical therapy with aspirin/statin/Plavix Continue surveillance in the outpatient setting Felt to be asymptomatic  Hypothyroidism Elevated-increased levothyroxine to 125 mcg-PCP to recheck TSH in 6 weeks.  Other medical issues were stable during the short overnight hospitalization.  Obesity: Estimated body mass index is 31.62 kg/m as calculated from the following:   Height as of this encounter: 5' 5"$  (1.651 m).   Weight as of this encounter: 86.2 kg.    Discharge Diagnoses:  Principal Problem:   TIA (transient ischemic attack) Active Problems:   Acquired hypothyroidism   Hyperlipidemia   Essential hypertension, benign   Sinus bradycardia   GERD (gastroesophageal reflux disease)   Acute prerenal azotemia   Discharge Instructions:  Activity:  As  tolerated  Discharge Instructions     Call MD for:  extreme fatigue   Complete by: As directed    Call MD for:  persistant dizziness or light-headedness   Complete by: As directed    Diet - low sodium heart healthy   Complete by: As directed    Discharge instructions   Complete by: As directed    Follow with Primary MD  Mosie Lukes, MD in 1-2 weeks  Your thyroid medication dosage was adjusted-please ask your primary care practitioner to repeat TSH in 6 weeks.  Please get a complete blood count and chemistry panel checked by your Primary MD at your next visit, and again as instructed by your Primary MD.  Get Medicines reviewed and adjusted: Please take all your medications with you for your next visit with your Primary MD  Laboratory/radiological data: Please request your Primary MD to go over all hospital tests and procedure/radiological results at the follow up, please ask your Primary MD to get all Hospital records sent to his/her office.  In some cases, they will be blood work, cultures and biopsy results pending at the time of your discharge. Please request that your primary care M.D. follows up on these results.  Also Note the following: If you experience worsening of your admission symptoms, develop shortness of breath, life threatening emergency, suicidal or homicidal thoughts you must seek medical attention immediately by calling 911 or calling your MD immediately  if symptoms less severe.  You must read complete instructions/literature along with all the  possible adverse reactions/side effects for all the Medicines you take and that have been prescribed to you. Take any new Medicines after you have completely understood and accpet all the possible adverse reactions/side effects.   Do not drive when taking Pain medications or sleeping medications (Benzodaizepines)  Do not take more than prescribed Pain, Sleep and Anxiety Medications. It is not advisable to combine  anxiety,sleep and pain medications without talking with your primary care practitioner  Special Instructions: If you have smoked or chewed Tobacco  in the last 2 yrs please stop smoking, stop any regular Alcohol  and or any Recreational drug use.  Wear Seat belts while driving.  Please note: You were cared for by a hospitalist during your hospital stay. Once you are discharged, your primary care physician will handle any further medical issues. Please note that NO REFILLS for any discharge medications will be authorized once you are discharged, as it is imperative that you return to your primary care physician (or establish a relationship with a primary care physician if you do not have one) for your post hospital discharge needs so that they can reassess your need for medications and monitor your lab values.   Increase activity slowly   Complete by: As directed       Allergies as of 02/06/2023       Reactions   Zetia [ezetimibe] Other (See Comments)   Myalgia, paresthesias and weakness   Penicillin G Rash        Medication List     TAKE these medications    amLODipine 10 MG tablet Commonly known as: NORVASC Take 1 tablet (10 mg total) by mouth daily.   aspirin EC 81 MG tablet Take 81 mg by mouth daily.   B-complex with vitamin C tablet Take 1 tablet by mouth daily.   bisoprolol 5 MG tablet Commonly known as: ZEBETA Take 1 tablet (5 mg total) by mouth daily.   calcitRIOL 0.25 MCG capsule Commonly known as: ROCALTROL TAKE 3 CAPSULES BY MOUTH DAILY   ciclopirox 8 % solution Commonly known as: Penlac Apply topically at bedtime. Apply over nail and surrounding skin. Apply daily over previous coat. After seven (7) days, may remove with alcohol and continue cycle.   clopidogrel 75 MG tablet Commonly known as: PLAVIX Take 1 tablet (75 mg total) by mouth daily.   diazepam 5 MG tablet Commonly known as: Valium Take 1 tablet (5 mg total) by mouth daily as needed for  anxiety (MRI). Take 1 one hour prior to MRI and another when arrive   furosemide 20 MG tablet Commonly known as: LASIX 1 tab po daily prn severe pedal edema   isosorbide mononitrate 60 MG 24 hr tablet Commonly known as: IMDUR Take 1 tablet (60 mg total) by mouth daily.   levothyroxine 125 MCG tablet Commonly known as: SYNTHROID Take 1 tablet (125 mcg total) by mouth daily before breakfast. What changed:  medication strength how much to take   multivitamin with minerals Tabs tablet Take 1 tablet by mouth daily.   nitroGLYCERIN 0.4 MG SL tablet Commonly known as: NITROSTAT Place 1 tablet (0.4 mg total) under the tongue every 5 (five) minutes as needed for chest pain.   oxyCODONE 5 MG immediate release tablet Commonly known as: Roxicodone Take 1 tablet (5 mg total) by mouth every 6 (six) hours as needed for up to 20 doses for breakthrough pain.   pantoprazole 40 MG tablet Commonly known as: PROTONIX TAKE 1 TABLET BY MOUTH  DAILY   Repatha SureClick [id number] MG/ML Soaj Generic drug: Evolocumab INJECT 140MG SUBCUTANEOUSLY EVERY 2 WEEKS   rosuvastatin 40 MG tablet Commonly known as: CRESTOR TAKE 1 TABLET BY MOUTH DAILY   urea 10 % cream Commonly known as: CARMOL Apply topically as needed.   Vitamin D 125 MCG (5000 UT) Caps Take 5,000 Units by mouth daily in the afternoon.        Follow-up Information     Mosie Lukes, MD. Schedule an appointment as soon as possible for a visit in 1 week(s).   Specialty: Family Medicine Contact information: [place] [address] [phone]                Allergies  Allergen Reactions   Zetia [Ezetimibe] Other (See Comments)    Myalgia, paresthesias and weakness   Penicillin G Rash     Other Procedures/Studies: CT ANGIO HEAD NECK W WO CM  Result Date: 02/06/2023 CLINICAL DATA:  TIA EXAM: CT ANGIOGRAPHY HEAD AND NECK TECHNIQUE: Multidetector CT imaging of the head and neck was  performed using the standard protocol during bolus administration of intravenous contrast. Multiplanar CT image reconstructions and MIPs were obtained to evaluate the vascular anatomy. Carotid stenosis measurements (when applicable) are obtained utilizing NASCET criteria, using the distal internal carotid diameter as the denominator. RADIATION DOSE REDUCTION: This exam was performed according to the departmental dose-optimization program which includes automated exposure control, adjustment of the mA and/or kV according to patient size and/or use of iterative reconstruction technique. CONTRAST:  73m OMNIPAQUE IOHEXOL 350 MG/ML SOLN COMPARISON:  02/05/2023 CT head and 02/06/2023 MRI head FINDINGS: CT HEAD FINDINGS For noncontrast findings, please see same day CT head. CTA NECK FINDINGS Aortic arch: Standard branching. Imaged portion shows no evidence of aneurysm or dissection. No significant stenosis of the major arch vessel origins. Aortic atherosclerosis. Right carotid system: 50% stenosis in the proximal right ICA, secondary to calcified plaque (series 6, image 189). No evidence of dissection or occlusion. Left carotid system: No evidence of dissection, occlusion, or hemodynamically significant stenosis (greater than 50%). Vertebral arteries: No evidence of dissection, occlusion, or hemodynamically significant stenosis (greater than 50%). Skeleton: No acute osseous abnormality. Other neck: Negative. Upper chest: Negative. Review of the MIP images confirms the above findings CTA HEAD FINDINGS Anterior circulation: Both internal carotid arteries are patent to the termini, without significant stenosis. Patent right A1. Aplastic left A1. Normal anterior communicating artery. Anterior cerebral arteries are patent to their distal aspects. No M1 stenosis or occlusion. MCA branches perfused and symmetric. Posterior circulation: Vertebral arteries patent to the vertebrobasilar junction without stenosis. Basilar patent to  its distal aspect. Superior cerebellar arteries patent proximally. Patent P1 segments. PCAs perfused to their distal aspects without stenosis. The bilateral posterior communicating arteries are not visualized. Venous sinuses: As permitted by contrast timing, patent. Anatomic variants: None significant. Review of the MIP images confirms the above findings IMPRESSION: 1. 50% stenosis in the proximal right ICA. No other hemodynamically significant stenosis in the neck. 2. No intracranial large vessel occlusion or significant stenosis. 3. Aortic atherosclerosis. Aortic Atherosclerosis (ICD10-I70.0). Electronically Signed   By: AMerilyn BabaM.D.   On: 02/06/2023 02:06   MR BRAIN WO CONTRAST  Result Date: 02/06/2023 CLINICAL DATA:  Left-sided facial numbness EXAM: MRI HEAD WITHOUT CONTRAST TECHNIQUE: Multiplanar, multiecho pulse sequences of the brain and surrounding structures were obtained without intravenous contrast. COMPARISON:  No prior MRI available, correlation is made with CT head  02/05/2023 FINDINGS: Brain: No restricted diffusion to suggest acute or subacute infarct. No acute hemorrhage, mass, mass effect, or midline shift. No hydrocephalus or extra-axial collection. Normal pituitary and craniocervical junction. No hemosiderin deposition to suggest remote hemorrhage. Normal cerebral volume for age. No evidence of remote cortical or lacunar infarct. Vascular: Normal arterial flow voids. Skull and upper cervical spine: Normal marrow signal. Sinuses/Orbits: Clear paranasal sinuses. No acute finding in the orbits. Status post bilateral lens replacements. Other: The mastoid air cells are well aerated. IMPRESSION: No acute intracranial process. No evidence of acute or subacute infarct. Electronically Signed   By: Merilyn Baba M.D.   On: 02/06/2023 01:49   CT HEAD WO CONTRAST  Result Date: 02/05/2023 CLINICAL DATA:  Neuro deficit, acute, stroke suspected. LEFT-sided facial numbness beginning last night and  worsening this morning. EXAM: CT HEAD WITHOUT CONTRAST TECHNIQUE: Contiguous axial images were obtained from the base of the skull through the vertex without intravenous contrast. RADIATION DOSE REDUCTION: This exam was performed according to the departmental dose-optimization program which includes automated exposure control, adjustment of the mA and/or kV according to patient size and/or use of iterative reconstruction technique. COMPARISON:  Head CT dated 12/21/2011. FINDINGS: Brain: Ventricles are stable in size and configuration. Mild chronic small vessel ischemic changes noted within the bilateral periventricular white matter regions. There is no mass, hemorrhage, edema or other evidence of acute parenchymal abnormality. No extra-axial hemorrhage. Vascular: Chronic calcified atherosclerotic changes of the large vessels at the skull base. No unexpected hyperdense vessel. Skull: Normal. Negative for fracture or focal lesion. Sinuses/Orbits: No acute finding. Other: None. IMPRESSION: 1. No acute findings. No intracranial mass, hemorrhage or edema. 2. Mild chronic small vessel ischemic changes in the periventricular white matter. Electronically Signed   By: Franki Cabot M.D.   On: 02/05/2023 15:06     TODAY-DAY OF DISCHARGE:  Subjective:   [name] today has no headache,no chest abdominal pain,no new weakness tingling or numbness, feels much better wants to go home today.   Objective:   Blood pressure (!) 152/71, pulse (!) 50, temperature 98.3 F (36.8 C), temperature source Oral, resp. rate 20, height 5' 5"$  (1.651 m), weight 86.2 kg, SpO2 94 %.  Intake/Output Summary (Last 24 hours) at 02/06/2023 1025 Last data filed at 02/06/2023 0944 Gross per 24 hour  Intake 80.39 ml  Output --  Net 80.39 ml   Filed Weights   02/05/23 1403  Weight: 86.2 kg    Exam: Awake Alert, Oriented *3, No new F.N deficits, Normal affect [place].AT,PERRAL Supple Neck,No JVD, No cervical lymphadenopathy  appriciated.  Symmetrical Chest wall movement, Good air movement bilaterally, CTAB RRR,No Gallops,Rubs or new Murmurs, No Parasternal Heave +ve B.Sounds, Abd Soft, Non tender, No organomegaly appriciated, No rebound -guarding or rigidity. No Cyanosis, Clubbing or edema, No new Rash or bruise   PERTINENT RADIOLOGIC STUDIES: CT ANGIO HEAD NECK W WO CM  Result Date: 02/06/2023 CLINICAL DATA:  TIA EXAM: CT ANGIOGRAPHY HEAD AND NECK TECHNIQUE: Multidetector CT imaging of the head and neck was performed using the standard protocol during bolus administration of intravenous contrast. Multiplanar CT image reconstructions and MIPs were obtained to evaluate the vascular anatomy. Carotid stenosis measurements (when applicable) are obtained utilizing NASCET criteria, using the distal internal carotid diameter as the denominator. RADIATION DOSE REDUCTION: This exam was performed according to the departmental dose-optimization program which includes automated exposure control, adjustment of the mA and/or kV according to patient size and/or use of iterative reconstruction technique. CONTRAST:  53m OMNIPAQUE IOHEXOL 350 MG/ML SOLN COMPARISON:  02/05/2023 CT head and 02/06/2023 MRI head FINDINGS: CT HEAD FINDINGS For noncontrast findings, please see same day CT head. CTA NECK FINDINGS Aortic arch: Standard branching. Imaged portion shows no evidence of aneurysm or dissection. No significant stenosis of the major arch vessel origins. Aortic atherosclerosis. Right carotid system: 50% stenosis in the proximal right ICA, secondary to calcified plaque (series 6, image 189). No evidence of dissection or occlusion. Left carotid system: No evidence of dissection, occlusion, or hemodynamically significant stenosis (greater than 50%). Vertebral arteries: No evidence of dissection, occlusion, or hemodynamically significant stenosis (greater than 50%). Skeleton: No acute osseous abnormality. Other neck: Negative. Upper chest:  Negative. Review of the MIP images confirms the above findings CTA HEAD FINDINGS Anterior circulation: Both internal carotid arteries are patent to the termini, without significant stenosis. Patent right A1. Aplastic left A1. Normal anterior communicating artery. Anterior cerebral arteries are patent to their distal aspects. No M1 stenosis or occlusion. MCA branches perfused and symmetric. Posterior circulation: Vertebral arteries patent to the vertebrobasilar junction without stenosis. Basilar patent to its distal aspect. Superior cerebellar arteries patent proximally. Patent P1 segments. PCAs perfused to their distal aspects without stenosis. The bilateral posterior communicating arteries are not visualized. Venous sinuses: As permitted by contrast timing, patent. Anatomic variants: None significant. Review of the MIP images confirms the above findings IMPRESSION: 1. 50% stenosis in the proximal right ICA. No other hemodynamically significant stenosis in the neck. 2. No intracranial large vessel occlusion or significant stenosis. 3. Aortic atherosclerosis. Aortic Atherosclerosis (ICD10-I70.0). Electronically Signed   By: AMerilyn BabaM.D.   On: 02/06/2023 02:06   MR BRAIN WO CONTRAST  Result Date: 02/06/2023 CLINICAL DATA:  Left-sided facial numbness EXAM: MRI HEAD WITHOUT CONTRAST TECHNIQUE: Multiplanar, multiecho pulse sequences of the brain and surrounding structures were obtained without intravenous contrast. COMPARISON:  No prior MRI available, correlation is made with CT head 02/05/2023 FINDINGS: Brain: No restricted diffusion to suggest acute or subacute infarct. No acute hemorrhage, mass, mass effect, or midline shift. No hydrocephalus or extra-axial collection. Normal pituitary and craniocervical junction. No hemosiderin deposition to suggest remote hemorrhage. Normal cerebral volume for age. No evidence of remote cortical or lacunar infarct. Vascular: Normal arterial flow voids. Skull and upper  cervical spine: Normal marrow signal. Sinuses/Orbits: Clear paranasal sinuses. No acute finding in the orbits. Status post bilateral lens replacements. Other: The mastoid air cells are well aerated. IMPRESSION: No acute intracranial process. No evidence of acute or subacute infarct. Electronically Signed   By: AMerilyn BabaM.D.   On: 02/06/2023 01:49   CT HEAD WO CONTRAST  Result Date: 02/05/2023 CLINICAL DATA:  Neuro deficit, acute, stroke suspected. LEFT-sided facial numbness beginning last night and worsening this morning. EXAM: CT HEAD WITHOUT CONTRAST TECHNIQUE: Contiguous axial images were obtained from the base of the skull through the vertex without intravenous contrast. RADIATION DOSE REDUCTION: This exam was performed according to the departmental dose-optimization program which includes automated exposure control, adjustment of the mA and/or kV according to patient size and/or use of iterative reconstruction technique. COMPARISON:  Head CT dated 12/21/2011. FINDINGS: Brain: Ventricles are stable in size and configuration. Mild chronic small vessel ischemic changes noted within the bilateral periventricular white matter regions. There is no mass, hemorrhage, edema or other evidence of acute parenchymal abnormality. No extra-axial hemorrhage. Vascular: Chronic calcified atherosclerotic changes of the large vessels at the skull base. No unexpected hyperdense vessel. Skull: Normal. Negative for fracture or focal  lesion. Sinuses/Orbits: No acute finding. Other: None. IMPRESSION: 1. No acute findings. No intracranial mass, hemorrhage or edema. 2. Mild chronic small vessel ischemic changes in the periventricular white matter. Electronically Signed   By: Franki Cabot M.D.   On: 02/05/2023 15:06     PERTINENT LAB RESULTS: CBC: Recent Labs    02/05/23 2259 02/06/23 0300  WBC 9.9 10.5  HGB 14.1 14.2  HCT 42.9 42.0  PLT 257 238   CMET CMP     Component Value Date/Time   NA 139 02/06/2023 0300    NA 146 (H) 01/14/2023 1101   K 3.5 02/06/2023 0300   CL 100 02/06/2023 0300   CO2 27 02/06/2023 0300   GLUCOSE 99 02/06/2023 0300   BUN 20 02/06/2023 0300   BUN 24 01/14/2023 1101   CREATININE 1.12 (H) 02/06/2023 0300   CREATININE 0.94 (H) 11/18/2018 1038   CALCIUM 8.4 (L) 02/06/2023 0300   CALCIUM 7.8 (L) 06/02/2007 2315   PROT 6.6 02/06/2023 0300   PROT 6.6 02/10/2018 0859   ALBUMIN 3.4 (L) 02/06/2023 0300   ALBUMIN 4.0 02/10/2018 0859   AST 16 02/06/2023 0300   ALT 13 02/06/2023 0300   ALKPHOS 75 02/06/2023 0300   BILITOT 0.4 02/06/2023 0300   BILITOT 0.3 02/10/2018 0859   GFRNONAA 51 (L) 02/06/2023 0300   GFRNONAA 61 11/18/2018 1038   GFRAA 45 (L) 09/12/2020 1639   GFRAA 71 11/18/2018 1038    GFR Estimated Creatinine Clearance: 47.1 mL/min (A) (by C-G formula based on SCr of 1.12 mg/dL (H)). No results for input(s): "LIPASE", "AMYLASE" in the last 72 hours. No results for input(s): "CKTOTAL", "CKMB", "CKMBINDEX", "TROPONINI" in the last 72 hours. Invalid input(s): "POCBNP" No results for input(s): "DDIMER" in the last 72 hours. Recent Labs    02/05/23 2259  HGBA1C 5.4   Recent Labs    02/05/23 2259  CHOL 200  HDL 52  LDLCALC 112*  TRIG 178*  CHOLHDL 3.8   Recent Labs    02/06/23 0300  TSH 7.578*   No results for input(s): "VITAMINB12", "FOLATE", "FERRITIN", "TIBC", "IRON", "RETICCTPCT" in the last 72 hours. Coags: Recent Labs    02/05/23 1425  INR 0.9   Microbiology: No results found for this or any previous visit (from the past 240 hour(s)).  FURTHER DISCHARGE INSTRUCTIONS:  Get Medicines reviewed and adjusted: Please take all your medications with you for your next visit with your Primary MD  Laboratory/radiological data: Please request your Primary MD to go over all hospital tests and procedure/radiological results at the follow up, please ask your Primary MD to get all Hospital records sent to his/her office.  In some cases, they will be  blood work, cultures and biopsy results pending at the time of your discharge. Please request that your primary care M.D. goes through all the records of your hospital data and follows up on these results.  Also Note the following: If you experience worsening of your admission symptoms, develop shortness of breath, life threatening emergency, suicidal or homicidal thoughts you must seek medical attention immediately by calling 911 or calling your MD immediately  if symptoms less severe.  You must read complete instructions/literature along with all the possible adverse reactions/side effects for all the Medicines you take and that have been prescribed to you. Take any new Medicines after you have completely understood and accpet all the possible adverse reactions/side effects.   Do not drive when taking Pain medications or sleeping medications (Benzodaizepines)  Do  not take more than prescribed Pain, Sleep and Anxiety Medications. It is not advisable to combine anxiety,sleep and pain medications without talking with your primary care practitioner  Special Instructions: If you have smoked or chewed Tobacco  in the last 2 yrs please stop smoking, stop any regular Alcohol  and or any Recreational drug use.  Wear Seat belts while driving.  Please note: You were cared for by a hospitalist during your hospital stay. Once you are discharged, your primary care physician will handle any further medical issues. Please note that NO REFILLS for any discharge medications will be authorized once you are discharged, as it is imperative that you return to your primary care physician (or establish a relationship with a primary care physician if you do not have one) for your post hospital discharge needs so that they can reassess your need for medications and monitor your lab values.  Total Time spent coordinating discharge including counseling, education and face to face time equals less than 30  minutes.  SignedOren Binet 02/06/2023 10:25 AM

## 2023-02-06 NOTE — Evaluation (Signed)
Occupational Therapy Evaluation and Discharge Patient Details Name: [name] MRN: [id number] DOB: [birth year] Today's Date: 02/06/2023   History of Present Illness Pt is a 77 year old woman admitted on 2/13 with L side facial numbness, suspect TIA as imaging negative for strok. PMH: GERD, HTN, HLD, CAD, MI s/p stents x 2, BPPV, acquired hypothyroidism.   Clinical Impression   Pt is functioning independently in ADLs and mobility. Ambulated in hall without LOB without AD. Pt endorses chronic back pain and slight numbness on L side of face remains.  Returned to bed for EEG. No further OT needs.      Recommendations for follow up therapy are one component of a multi-disciplinary discharge planning process, led by the attending physician.  Recommendations may be updated based on patient status, additional functional criteria and insurance authorization.   Follow Up Recommendations  No OT follow up     Assistance Recommended at Discharge None  Patient can return home with the following      Functional Status Assessment  Patient has not had a recent decline in their functional status  Equipment Recommendations  None recommended by OT    Recommendations for Other Services       Precautions / Restrictions Precautions Precautions: None      Mobility Bed Mobility Overal bed mobility: Modified Independent             General bed mobility comments: HOB up    Transfers Overall transfer level: Independent Equipment used: None               General transfer comment: from bed and toilet      Balance Overall balance assessment: Needs assistance   Sitting balance-Leahy Scale: Normal       Standing balance-Leahy Scale: Good                             ADL either performed or assessed with clinical judgement   ADL Overall ADL's : Independent                                             Vision Baseline Vision/History: 1 Wears  glasses Ability to See in Adequate Light: 0 Adequate Patient Visual Report: No change from baseline       Perception     Praxis      Pertinent Vitals/Pain Pain Assessment Pain Assessment: Faces Faces Pain Scale: Hurts little more Pain Location: back Pain Descriptors / Indicators: Grimacing Pain Intervention(s): Monitored during session, Repositioned     Hand Dominance Right   Extremity/Trunk Assessment Upper Extremity Assessment Upper Extremity Assessment: Overall WFL for tasks assessed   Lower Extremity Assessment Lower Extremity Assessment: Overall WFL for tasks assessed   Cervical / Trunk Assessment Cervical / Trunk Assessment: Normal   Communication Communication Communication: No difficulties   Cognition Arousal/Alertness: Awake/alert Behavior During Therapy: WFL for tasks assessed/performed Overall Cognitive Status: Within Functional Limits for tasks assessed                                       General Comments       Exercises     Shoulder Instructions      Home Living Family/patient expects to be discharged to::  Private residence Living Arrangements: Alone Available Help at Discharge: Family;Available PRN/intermittently Type of Home: House Home Access: Level entry     Home Layout: One level               Home Equipment: None          Prior Functioning/Environment Prior Level of Function : Independent/Modified Independent;Driving                        OT Problem List:        OT Treatment/Interventions:      OT Goals(Current goals can be found in the care plan section)    OT Frequency:      Co-evaluation              AM-PAC OT "6 Clicks" Daily Activity     Outcome Measure Help from another person eating meals?: None Help from another person taking care of personal grooming?: None Help from another person toileting, which includes using toliet, bedpan, or urinal?: None Help from another person  bathing (including washing, rinsing, drying)?: None Help from another person to put on and taking off regular upper body clothing?: None Help from another person to put on and taking off regular lower body clothing?: None 6 Click Score: 24   End of Session Equipment Utilized During Treatment: Gait belt  Activity Tolerance: Patient tolerated treatment well Patient left: in bed;with call bell/phone within reach;with family/visitor present;Other (comment) (EEG staff)  OT Visit Diagnosis: Pain                Time: [id number] OT Time Calculation (min): 15 min Charges:  OT General Charges $OT Visit: 1 Visit OT Evaluation $OT Eval Low Complexity: [place], OTR/L Acute Rehabilitation Services Office: [phone]  Malka So 02/06/2023, 9:08 AM

## 2023-02-06 NOTE — Progress Notes (Addendum)
STROKE TEAM PROGRESS NOTE   INTERVAL HISTORY Her son is at the bedside.  Left face perioral tingling, no tingling in the hands or fingers. States this episode was different than the last one. TSH is 7.578- she reports compliance with synthroid and other medications including ASA and Plavix.  MRI scan of the brain is negative for any acute stroke.  CT angiogram shows 50% proximal right ICA stenosis with calcification.  Echocardiogram in September 2023 was normal.  EEG is pending Follows with Dr. Krista Blue at [facility]. Follow up outpatient.   Vitals:   02/05/23 2245 02/05/23 2300 02/06/23 0423 02/06/23 0640  BP: (!) 142/64  134/62 (!) 152/71  Pulse: (!) 52 (!) 50  (!) 50  Resp: 17 20 20 20  $ Temp: 98 F (36.7 C)  98.3 F (36.8 C)   TempSrc: Oral  Oral   SpO2: 95% 97%  94%  Weight:      Height:       CBC:  Recent Labs  Lab 02/05/23 1425 02/05/23 2259 02/06/23 0300  WBC 9.9 9.9 10.5  NEUTROABS 5.6  --   --   HGB 14.3 14.1 14.2  HCT 43.4 42.9 42.0  MCV 87.5 88.5 86.4  PLT 277 257 [id number]   Basic Metabolic Panel:  Recent Labs  Lab 02/05/23 1425 02/05/23 2259 02/06/23 0300  NA 139  --  139  K 4.3  --  3.5  CL 102  --  100  CO2 27  --  27  GLUCOSE 105*  --  99  BUN 25*  --  20  CREATININE 1.12* 1.20* 1.12*  CALCIUM 9.0  --  8.4*  MG  --   --  2.0   Lipid Panel:  Recent Labs  Lab 02/05/23 2259  CHOL 200  TRIG 178*  HDL 52  CHOLHDL 3.8  VLDL 36  LDLCALC 112*   HgbA1c:  Recent Labs  Lab 02/05/23 2259  HGBA1C 5.4   Urine Drug Screen: No results for input(s): "LABOPIA", "COCAINSCRNUR", "LABBENZ", "AMPHETMU", "THCU", "LABBARB" in the last 168 hours.  Alcohol Level  Recent Labs  Lab 02/05/23 1425  ETH <10    IMAGING past 24 hours CT ANGIO HEAD NECK W WO CM  Result Date: 02/06/2023 CLINICAL DATA:  TIA EXAM: CT ANGIOGRAPHY HEAD AND NECK TECHNIQUE: Multidetector CT imaging of the head and neck was performed using the standard protocol during bolus administration of  intravenous contrast. Multiplanar CT image reconstructions and MIPs were obtained to evaluate the vascular anatomy. Carotid stenosis measurements (when applicable) are obtained utilizing NASCET criteria, using the distal internal carotid diameter as the denominator. RADIATION DOSE REDUCTION: This exam was performed according to the departmental dose-optimization program which includes automated exposure control, adjustment of the mA and/or kV according to patient size and/or use of iterative reconstruction technique. CONTRAST:  14m OMNIPAQUE IOHEXOL 350 MG/ML SOLN COMPARISON:  02/05/2023 CT head and 02/06/2023 MRI head FINDINGS: CT HEAD FINDINGS For noncontrast findings, please see same day CT head. CTA NECK FINDINGS Aortic arch: Standard branching. Imaged portion shows no evidence of aneurysm or dissection. No significant stenosis of the major arch vessel origins. Aortic atherosclerosis. Right carotid system: 50% stenosis in the proximal right ICA, secondary to calcified plaque (series 6, image 189). No evidence of dissection or occlusion. Left carotid system: No evidence of dissection, occlusion, or hemodynamically significant stenosis (greater than 50%). Vertebral arteries: No evidence of dissection, occlusion, or hemodynamically significant stenosis (greater than 50%). Skeleton: No acute osseous abnormality. Other neck:  Negative. Upper chest: Negative. Review of the MIP images confirms the above findings CTA HEAD FINDINGS Anterior circulation: Both internal carotid arteries are patent to the termini, without significant stenosis. Patent right A1. Aplastic left A1. Normal anterior communicating artery. Anterior cerebral arteries are patent to their distal aspects. No M1 stenosis or occlusion. MCA branches perfused and symmetric. Posterior circulation: Vertebral arteries patent to the vertebrobasilar junction without stenosis. Basilar patent to its distal aspect. Superior cerebellar arteries patent proximally.  Patent P1 segments. PCAs perfused to their distal aspects without stenosis. The bilateral posterior communicating arteries are not visualized. Venous sinuses: As permitted by contrast timing, patent. Anatomic variants: None significant. Review of the MIP images confirms the above findings IMPRESSION: 1. 50% stenosis in the proximal right ICA. No other hemodynamically significant stenosis in the neck. 2. No intracranial large vessel occlusion or significant stenosis. 3. Aortic atherosclerosis. Aortic Atherosclerosis (ICD10-I70.0). Electronically Signed   By: Merilyn Baba M.D.   On: 02/06/2023 02:06   MR BRAIN WO CONTRAST  Result Date: 02/06/2023 CLINICAL DATA:  Left-sided facial numbness EXAM: MRI HEAD WITHOUT CONTRAST TECHNIQUE: Multiplanar, multiecho pulse sequences of the brain and surrounding structures were obtained without intravenous contrast. COMPARISON:  No prior MRI available, correlation is made with CT head 02/05/2023 FINDINGS: Brain: No restricted diffusion to suggest acute or subacute infarct. No acute hemorrhage, mass, mass effect, or midline shift. No hydrocephalus or extra-axial collection. Normal pituitary and craniocervical junction. No hemosiderin deposition to suggest remote hemorrhage. Normal cerebral volume for age. No evidence of remote cortical or lacunar infarct. Vascular: Normal arterial flow voids. Skull and upper cervical spine: Normal marrow signal. Sinuses/Orbits: Clear paranasal sinuses. No acute finding in the orbits. Status post bilateral lens replacements. Other: The mastoid air cells are well aerated. IMPRESSION: No acute intracranial process. No evidence of acute or subacute infarct. Electronically Signed   By: Merilyn Baba M.D.   On: 02/06/2023 01:49   CT HEAD WO CONTRAST  Result Date: 02/05/2023 CLINICAL DATA:  Neuro deficit, acute, stroke suspected. LEFT-sided facial numbness beginning last night and worsening this morning. EXAM: CT HEAD WITHOUT CONTRAST TECHNIQUE:  Contiguous axial images were obtained from the base of the skull through the vertex without intravenous contrast. RADIATION DOSE REDUCTION: This exam was performed according to the departmental dose-optimization program which includes automated exposure control, adjustment of the mA and/or kV according to patient size and/or use of iterative reconstruction technique. COMPARISON:  Head CT dated 12/21/2011. FINDINGS: Brain: Ventricles are stable in size and configuration. Mild chronic small vessel ischemic changes noted within the bilateral periventricular white matter regions. There is no mass, hemorrhage, edema or other evidence of acute parenchymal abnormality. No extra-axial hemorrhage. Vascular: Chronic calcified atherosclerotic changes of the large vessels at the skull base. No unexpected hyperdense vessel. Skull: Normal. Negative for fracture or focal lesion. Sinuses/Orbits: No acute finding. Other: None. IMPRESSION: 1. No acute findings. No intracranial mass, hemorrhage or edema. 2. Mild chronic small vessel ischemic changes in the periventricular white matter. Electronically Signed   By: Franki Cabot M.D.   On: 02/05/2023 15:06    PHYSICAL EXAM  Constitutional: Appears well-developed and well-nourished pleasant elderly Arabic lady.   Cardiovascular: Normal rate and regular rhythm.  Respiratory: Effort normal, non-labored breathing  Neuro: Mental Status: Patient is awake, alert, oriented to person, place, month, year, and situation. Patient is able to give a clear and coherent history. No signs of aphasia or neglect Cranial Nerves: II: Visual Fields are full. Pupils are equal,  round, and reactive to light.   III,IV, VI: EOMI without ptosis or diploplia.  V: Facial sensation is symmetric to temperature VII: Facial movement is symmetric resting and smiling VIII: Hearing is intact to voice X: Palate elevates symmetrically XI: Shoulder shrug is symmetric. XII: Tongue protrudes midline without  atrophy or fasciculations.  Motor: Tone is normal. Bulk is normal. 5/5 strength was present in all four extremities.  Sensory: Sensation is symmetric to light touch in the arms and legs.  Cerebellar: FNF and HKS are intact bilaterally        ASSESSMENT/PLAN Ms. ELYA DAINTY is a 77 y.o. female with history of GERD, HTN, HLD, CAD who presents with 2 episodes of left facial numbness. Numbness has resolved and did not extend to the arms, legs, or fingers.  Transient left facial paresthesia etiology unclear.  Doubt TIA due to absence of any associated symptoms. Code Stroke CT head No acute abnormality.  CTA head & neck Proximal R ICA with 50% stenosis  MRI  no acute abnormality 2D Echo EF 60-65% LDL 112 HgbA1c 5.4 VTE prophylaxis - Lovenox    Diet   Diet regular Room service appropriate? Yes; Fluid consistency: Thin   aspirin 81 mg daily and clopidogrel 75 mg daily prior to admission, now on aspirin 81 mg daily and clopidogrel 75 mg daily.  Therapy recommendations:  No follow up  Disposition:  Home  Hypertension Home meds:  Norvasc 51m, imdur Stable Permissive hypertension (OK if < 220/120) but gradually normalize in 5-7 days Long-term BP goal normotensive  Hyperlipidemia Home meds:  Crestor 443m resumed in hospital LDL 112, goal < 70 Continue statin at discharge  Other Stroke Risk Factors Advanced Age >/= 6582Obesity, Body mass index is 31.62 kg/m., BMI >/= 30 associated with increased stroke risk, recommend weight loss, diet and exercise as appropriate  Hx stroke/TIA Previous episode of facial numbness Coronary artery disease Continue DAPT therapy  Other Active Problems Hypothyroidism TSH 7.5878 On synthroid 11233m-> needs to be followed by PCP outpatient   Hospital day # 0  Patient seen and examined by NP/APP with MD. MD to update note as needed.   DevJanine OresNP, FNP-BC [facility] Pager: [phone] have personally  obtained history,examined this patient, reviewed notes, independently viewed imaging studies, participated in medical decision making and plan of care.ROS completed by me personally and pertinent positives fully documented  I have made any additions or clarifications directly to the above note. Agree with note above.  Patient with presented with transient 1 episode of left perioral tingling without any associated hand and feet paresthesias, slurred speech or other neurological symptoms.  She had an episode of left facial tingling in the past 2 years ago and was seen by Dr. YanKrista Blue that time she had refused an MRI.  Current MRI shows no acute stroke.  She does some vascular risk factors but her TSH is abnormal.  Recommend optimize thyroid replacement as per primary team.  Continue dual antiplatelet therapy aspirin and Plavix for her cardiac disease and aggressive risk factor modification.  Stroke team will sign off.  Can you call for questions.  Long discussion with patient, son and grandson at the bedside and answered questions.  Discussed with Dr.Ghimire.  Greater than 50% time during this 50-minute visit was spent in counseling and coordination of care about her facial paresthesias and discussion about evaluation and treatment plan once questions.  PraAntony ContrasD Medical Director [facility] Pager: [phone] 02/06/2023 2:52 PM  To contact Stroke Continuity provider, please refer to [email or web address] After hours, contact General Neurology

## 2023-02-06 NOTE — Consult Note (Signed)
NEUROLOGY CONSULTATION NOTE   Date of service: February 06, 2023 Patient Name: [name] MRN:  [id number] DOB:  November 13, 1946 Reason for consult: "TIA, episode of L facial numbness x 1 hour" Requesting Provider: Rhetta Mura, DO _ _ _   _ __   _ __ _ _  __ __   _ __   __ _  History of Present Illness  [name] is a 77 y.o. female with PMH significant for GERD, HTN, HLD, CAD who presents with 2 episodes of left facial numbness. First was on 02/04/23 at 1500 and lasted 1 hour. On 02/05/23, in AM, woke up and was fine. At 1000AM, left facial numbness recurred and lasted until 1730 and resolved.  No prior similar episodes. But upon chart review, seen by Dr. Krista Blue in [place] 2023 for an episode of L facial numbness.  No prior hx of strokes, no family hx of strokes.  LKW: 02/04/23 at 1500 mRS: 0 tNKASE:  not offered, resolved. Thrombectomy: not offered, resolved and not consistent with LVO NIHSS components Score: Comment  1a Level of Conscious 0[x]$  1[]$  2[]$  3[]$      1b LOC Questions 0[x]$  1[]$  2[]$       1c LOC Commands 0[x]$  1[]$  2[]$       2 Best Gaze 0[x]$  1[]$  2[]$       3 Visual 0[x]$  1[]$  2[]$  3[]$      4 Facial Palsy 0[x]$  1[]$  2[]$  3[]$      5a Motor Arm - left 0[x]$  1[]$  2[]$  3[]$  4[]$  UN[]$    5b Motor Arm - Right 0[x]$  1[]$  2[]$  3[]$  4[]$  UN[]$    6a Motor Leg - Left 0[x]$  1[]$  2[]$  3[]$  4[]$  UN[]$    6b Motor Leg - Right 0[x]$  1[]$  2[]$  3[]$  4[]$  UN[]$    7 Limb Ataxia 0[x]$  1[]$  2[]$  3[]$  UN[]$     8 Sensory 0[x]$  1[]$  2[]$  UN[]$      9 Best Language 0[x]$  1[]$  2[]$  3[]$      10 Dysarthria 0[x]$  1[]$  2[]$  UN[]$      11 Extinct. and Inattention 0[x]$  1[]$  2[]$       TOTAL: 0      ROS   Constitutional Denies weight loss, fever and chills.   HEENT Denies changes in vision and hearing.   Respiratory Denies SOB and cough.   CV Denies palpitations and CP   GI Denies abdominal pain, nausea, vomiting and diarrhea.   GU Denies dysuria and urinary frequency.   MSK Denies myalgia and joint pain.   Skin Denies rash and pruritus.    Neurological Denies headache and syncope.   Psychiatric Denies recent changes in mood. Denies anxiety and depression.    Past History   Past Medical History:  Diagnosis Date   Acute MI, subendocardial ([place])    Arthritis    Back pain 05/31/2013   Benign paroxysmal positional vertigo 08/05/2016   CAD (coronary artery disease)    a. NSTEMI 10/2011: [facility] 11/19/11: pLAD 30%, oOM 60%, AVCFX 30%, CFX after OM2 30%, pRCA 60 and 70%, then 99%, AM 80-90% with TIMI 3 flow.  PCI: Promus DES x 2 to RCA; b. 06/2012 Cath: patent RCA stents w/ subtl occl of Acute Marginal (jailed)->Med rx; c. 05/2015 MV: EF 59%, mod mid infsept/inf/ap lat/ap infarct with peri-infarct isch-->Med Rx; d. 02/2016 Cath: LM nl, LAD 11m RI 50, RCA patent stents.   Colitis    Facial skin lesion 01/28/2017   GERD (gastroesophageal reflux disease)    occasional   Hyperlipidemia    Hypertension  Hypertensive heart disease    a. Echocardiogram 11/19/11: Difficult acoustic windows, EF 60-65%, normal LV wall thickness, grade 1 diastolic dysfunction   Hypoparathyroidism (HCC)    Low zinc level 11/26/2016   Multinodular thyroid    Goiter s/p thyroidectomy in 2007 with post-op  hypocalcemia and post-op hypothyroidism/hypoparathyroidism with hypocalcemia   Neck pain on right side 07/13/2013   Nocturia 05/31/2013   Osteoarthritis    Pain in right axilla 03/13/2016   Palpitations    a. 03/2012 - patient set up for event monitor but did not wear correctly -  she declined wearing a repeat monitor   Post-surgical hypothyroidism    Sun-damaged skin 12/05/2014   Tubular adenoma of colon 06/2011   Unspecified constipation 05/31/2013   Vertigo    Zinc deficiency 11/26/2015   Past Surgical History:  Procedure Laterality Date   CARDIAC CATHETERIZATION N/A 03/08/2016   Procedure: Left Heart Cath and Coronary Angiography;  Surgeon: Jettie Booze, MD;  Location: [facility];  Service: Cardiovascular;  Laterality: N/A;   COLONOSCOPY   Aenomatous polyps   07/05/2011   COLONOSCOPY N/A 09/28/2014   Procedure: COLONOSCOPY;  Surgeon: Ladene Artist, MD;  Location: [facility];  Service: Endoscopy;  Laterality: N/A;   CORONARY ANGIOPLASTY WITH STENT PLACEMENT     LEFT HEART CATH AND CORONARY ANGIOGRAPHY N/A 12/09/2018   Procedure: LEFT HEART CATH AND CORONARY ANGIOGRAPHY;  Surgeon: Jettie Booze, MD;  Location: [facility];  Service: Cardiovascular;  Laterality: N/A;   LEFT HEART CATHETERIZATION WITH CORONARY ANGIOGRAM N/A 07/17/2012   Procedure: LEFT HEART CATHETERIZATION WITH CORONARY ANGIOGRAM;  Surgeon: Hillary Bow, MD;  Location: [facility] CATH LAB;  Service: Cardiovascular;  Laterality: N/A;   PERCUTANEOUS CORONARY STENT INTERVENTION (PCI-S) N/A 11/19/2011   Procedure: PERCUTANEOUS CORONARY STENT INTERVENTION (PCI-S);  Surgeon: Hillary Bow, MD;  Location: [facility] CATH LAB;  Service: Cardiovascular;  Laterality: N/A;   POLYPECTOMY     SHOULDER ARTHROSCOPY W/ ROTATOR CUFF REPAIR     right   TOTAL THYROIDECTOMY  2007   GOITER   Family History  Problem Relation Age of Onset   Colon cancer Brother    Cancer Brother        COLON   Hypertension Father    Heart disease Father    Heart attack Father    Blindness Sister    Congestive Heart Failure Sister    Hypertension Sister    Healthy Daughter    Healthy Son    Thyroid disease Sister    Cancer Brother        multiple myelomas   Healthy Daughter    Healthy Daughter    Diabetes Neg Hx    Prostate cancer Neg Hx    Breast cancer Neg Hx    Esophageal cancer Neg Hx    Rectal cancer Neg Hx    Stomach cancer Neg Hx    Social History   Socioeconomic History   Marital status: Married    Spouse name: Not on file   Number of children: 4   Years of education: 14   Highest education level: Not on file  Occupational History   Occupation: Sales person at [employer] Use   Smoking status: Never   Smokeless tobacco: Never  Vaping Use   Vaping Use:  Never used  Substance and Sexual Activity   Alcohol use: Not Currently   Drug use: No   Sexual activity: Not Currently  Other Topics Concern  Not on file  Social History Narrative   Lives at home alone.  Her son lives near her.   Right-handed.   1 cup coffee per day.   Social Determinants of Health   Financial Resource Strain: Low Risk  (05/07/2022)   Overall Financial Resource Strain (CARDIA)    Difficulty of Paying Living Expenses: Not hard at all  Food Insecurity: No Food Insecurity (02/06/2023)   Hunger Vital Sign    Worried About Running Out of Food in the Last Year: Never true    Ran Out of Food in the Last Year: Never true  Transportation Needs: No Transportation Needs (02/06/2023)   PRAPARE - Hydrologist (Medical): No    Lack of Transportation (Non-Medical): No  Physical Activity: Sufficiently Active (05/07/2022)   Exercise Vital Sign    Days of Exercise per Week: 7 days    Minutes of Exercise per Session: 30 min  Stress: No Stress Concern Present (05/07/2022)   [place]    Feeling of Stress : Not at all  Social Connections: Not on file   Allergies  Allergen Reactions   Zetia [Ezetimibe] Other (See Comments)    Myalgia, paresthesias and weakness   Penicillin G Rash    Medications   Medications Prior to Admission  Medication Sig Dispense Refill Last Dose   amLODipine (NORVASC) 10 MG tablet Take 1 tablet (10 mg total) by mouth daily. 30 tablet 2    aspirin EC 81 MG tablet Take 81 mg by mouth daily.      B Complex-C (B-COMPLEX WITH VITAMIN C) tablet Take 1 tablet by mouth daily.      bisoprolol (ZEBETA) 5 MG tablet Take 1 tablet (5 mg total) by mouth daily. 100 tablet 3    calcitRIOL (ROCALTROL) 0.25 MCG capsule TAKE 3 CAPSULES BY MOUTH DAILY 300 capsule 2    Cholecalciferol (VITAMIN D) 125 MCG (5000 UT) CAPS Take 5,000 Units by mouth daily in the afternoon. 100 capsule  3    ciclopirox (PENLAC) 8 % solution Apply topically at bedtime. Apply over nail and surrounding skin. Apply daily over previous coat. After seven (7) days, may remove with alcohol and continue cycle. 6.6 mL 0    clopidogrel (PLAVIX) 75 MG tablet Take 1 tablet (75 mg total) by mouth daily. 90 tablet 3    diazepam (VALIUM) 5 MG tablet Take 1 tablet (5 mg total) by mouth daily as needed for anxiety (MRI). Take 1 one hour prior to MRI and another when arrive 10 tablet 0    furosemide (LASIX) 20 MG tablet 1 tab po daily prn severe pedal edema 10 tablet 0    isosorbide mononitrate (IMDUR) 60 MG 24 hr tablet Take 1 tablet (60 mg total) by mouth daily. 90 tablet 3    levothyroxine (SYNTHROID) 112 MCG tablet TAKE 1 TABLET BY MOUTH DAILY  BEFORE BREAKFAST 100 tablet 2    Multiple Vitamin (MULTIVITAMIN WITH MINERALS) TABS tablet Take 1 tablet by mouth daily.      nitroGLYCERIN (NITROSTAT) 0.4 MG SL tablet Place 1 tablet (0.4 mg total) under the tongue every 5 (five) minutes as needed for chest pain. 25 tablet 3    oxyCODONE (ROXICODONE) 5 MG immediate release tablet Take 1 tablet (5 mg total) by mouth every 6 (six) hours as needed for up to 20 doses for breakthrough pain. 20 tablet 0    pantoprazole (PROTONIX) 40 MG tablet TAKE 1  TABLET BY MOUTH  DAILY 100 tablet 2    REPATHA SURECLICK [id number] MG/ML SOAJ INJECT 140MG SUBCUTANEOUSLY EVERY 2 WEEKS 6 mL 3    rosuvastatin (CRESTOR) 40 MG tablet TAKE 1 TABLET BY MOUTH DAILY 90 tablet 3    urea (CARMOL) 10 % cream Apply topically as needed. 71 g 0      Vitals   Vitals:   02/05/23 2132 02/05/23 2133 02/05/23 2245 02/05/23 2300  BP:   (!) 142/64   Pulse: (!) 54 (!) 52 (!) 52 (!) 50  Resp:   17 20  Temp:   98 F (36.7 C)   TempSrc:   Oral   SpO2: 97% 98% 95% 97%  Weight:      Height:         Body mass index is 31.62 kg/m.  Physical Exam   General: Laying comfortably in bed; in no acute distress.  HENT: Normal oropharynx and mucosa. Normal external  appearance of ears and nose.  Neck: Supple, no pain or tenderness  CV: No JVD. No peripheral edema.  Pulmonary: Symmetric Chest rise. Normal respiratory effort.  Abdomen: Soft to touch, non-tender.  Ext: No cyanosis, edema, or deformity Skin: No rash. Normal palpation of skin.   Musculoskeletal: Normal digits and nails by inspection. No clubbing.   Neurologic Examination  Mental status/Cognition: Alert, oriented to self, place, month and year, good attention.  Speech/language: Fluent, comprehension intact, object naming intact, repetition intact.  Cranial nerves:   CN II Pupils equal and reactive to light, no VF deficits    CN III,IV,VI EOM intact, no gaze preference or deviation, no nystagmus    CN V normal sensation in V1, V2, and V3 segments bilaterally    CN VII no asymmetry, no nasolabial fold flattening    CN VIII normal hearing to speech    CN IX & X normal palatal elevation, no uvular deviation   CN XI 5/5 head turn and 5/5 shoulder shrug bilaterally    CN XII midline tongue protrusion    Motor:  Muscle bulk: normal, tone normal, pronator drift none tremor none Mvmt Root Nerve  Muscle Right Left Comments  SA C5/6 Ax Deltoid 5 5   EF C5/6 Mc Biceps 5 5   EE C6/7/8 Rad Triceps 5 5   WF C6/7 Med FCR     WE C7/8 PIN ECU     F Ab C8/T1 U ADM/FDI 5 5   HF L1/2/3 Fem Illopsoas 5 5   KE L2/3/4 Fem Quad 5 5   DF L4/5 D Peron Tib Ant 5 5   PF S1/2 Tibial Grc/Sol 5 5    Sensation:  Light touch Intact throughout   Pin prick    Temperature    Vibration   Proprioception    Coordination/Complex Motor:  - Finger to Nose intact BL - Heel to shin intact BL - Rapid alternating movement are normal - Gait: Deferred.  Labs   CBC:  Recent Labs  Lab 02/05/23 1425 02/05/23 2259  WBC 9.9 9.9  NEUTROABS 5.6  --   HGB 14.3 14.1  HCT 43.4 42.9  MCV 87.5 88.5  PLT 277 [id number]    Basic Metabolic Panel:  Lab Results  Component Value Date   NA 139 02/05/2023   K 4.3 02/05/2023    CO2 27 02/05/2023   GLUCOSE 105 (H) 02/05/2023   BUN 25 (H) 02/05/2023   CREATININE 1.20 (H) 02/05/2023   CALCIUM 9.0 02/05/2023   GFRNONAA 47 (L)  02/05/2023   GFRAA 45 (L) 09/12/2020   Lipid Panel:  Lab Results  Component Value Date   LDLCALC 112 (H) 02/05/2023   HgbA1c:  Lab Results  Component Value Date   HGBA1C 5.4 02/05/2023   Urine Drug Screen: No results found for: "LABOPIA", "COCAINSCRNUR", "LABBENZ", "AMPHETMU", "THCU", "LABBARB"  Alcohol Level     Component Value Date/Time   ETH <10 02/05/2023 1425    CT Head without contrast(Personally reviewed): CTH was negative for a large hypodensity concerning for a large territory infarct or hyperdensity concerning for an ICH  CT angio Head and Neck with contrast(Personally reviewed): Proximal R ICA 50% stenosis.  MRI Brain(Personally reviewed): No stroke  Impression   [name] is a 77 y.o. female with PMH significant for GERD, HTN, HLD, CAD who presents with 2 episodes of left facial numbness that self resolved. No focal deficit on exam.  Episodes concerning for a stuttering lacune/TIA.  Recommendations   - Frequent Neuro checks per stroke unit protocol - No need to repeat TTE, recent TTE in Sept 2023 with EF of 60-65% - Recommend obtaining Lipid panel with LDL - Reports left leg heaviness and soreness to statins in the past. Is currently on Repatha. - Recommend HbA1c to evaluate for diabetes and how well it is controlled. - Antithrombotic - Aspirin 37m daily along with plavix 769mdaily x 21 days, followed by Plavix 7544maily alone. - Recommend DVT ppx - SBP goal - permissive hypertension first 24 h < 220/110. Held home meds.  - Recommend Telemetry monitoring for arrythmia - Recommend bedside swallow screen prior to PO intake. - Stroke education booklet - Recommend PT/OT/SLP consult  ______________________________________________________________________   Thank you for the opportunity to take  part in the care of this patient. If you have any further questions, please contact the neurology consultation attending.  Signed,  SalTyndallger Number [id number]_ _   _ __   _ __ _ _  __ __   _ __   __ _

## 2023-02-07 ENCOUNTER — Ambulatory Visit: Payer: 59 | Admitting: Podiatry

## 2023-02-07 ENCOUNTER — Ambulatory Visit: Payer: 59 | Admitting: Physical Therapy

## 2023-02-07 ENCOUNTER — Encounter: Payer: Self-pay | Admitting: Physical Therapy

## 2023-02-07 DIAGNOSIS — R293 Abnormal posture: Secondary | ICD-10-CM

## 2023-02-07 DIAGNOSIS — G8929 Other chronic pain: Secondary | ICD-10-CM

## 2023-02-07 DIAGNOSIS — M542 Cervicalgia: Secondary | ICD-10-CM

## 2023-02-07 DIAGNOSIS — R252 Cramp and spasm: Secondary | ICD-10-CM

## 2023-02-07 DIAGNOSIS — M5441 Lumbago with sciatica, right side: Secondary | ICD-10-CM | POA: Diagnosis not present

## 2023-02-07 NOTE — Therapy (Signed)
OUTPATIENT PHYSICAL THERAPY TREATMENT   Patient Name: [name] MRN: [id number] DOB:11-Oct-1946, 77 y.o., female Today's Date: 02/07/2023   PT End of Session - 02/07/23 1450     Visit Number 18    Number of Visits 25    Date for PT Re-Evaluation 02/28/23    Authorization Type [employer] Medicare    Progress Note Due on Visit 25    PT Start Time 1445    PT Stop Time 1500    PT Time Calculation (min) 15 min    Activity Tolerance Patient tolerated treatment well    Behavior During Therapy [facility] for tasks assessed/performed                       Past Medical History:  Diagnosis Date   Acute MI, subendocardial ([place])    Arthritis    Back pain 05/31/2013   Benign paroxysmal positional vertigo 08/05/2016   CAD (coronary artery disease)    a. NSTEMI 10/2011: [place] 11/19/11: pLAD 30%, oOM 60%, AVCFX 30%, CFX after OM2 30%, pRCA 60 and 70%, then 99%, AM 80-90% with TIMI 3 flow.  PCI: Promus DES x 2 to RCA; b. 06/2012 Cath: patent RCA stents w/ subtl occl of Acute Marginal (jailed)->Med rx; c. 05/2015 MV: EF 59%, mod mid infsept/inf/ap lat/ap infarct with peri-infarct isch-->Med Rx; d. 02/2016 Cath: LM nl, LAD 29m RI 50, RCA patent stents.   Colitis    Facial skin lesion 01/28/2017   GERD (gastroesophageal reflux disease)    occasional   Hyperlipidemia    Hypertension    Hypertensive heart disease    a. Echocardiogram 11/19/11: Difficult acoustic windows, EF 60-65%, normal LV wall thickness, grade 1 diastolic dysfunction   Hypoparathyroidism (HCC)    Low zinc level 11/26/2016   Multinodular thyroid    Goiter s/p thyroidectomy in 2007 with post-op  hypocalcemia and post-op hypothyroidism/hypoparathyroidism with hypocalcemia   Neck pain on right side 07/13/2013   Nocturia 05/31/2013   Osteoarthritis    Pain in right axilla 03/13/2016   Palpitations    a. 03/2012 - patient set up for event monitor but did not wear correctly -  she declined wearing a repeat monitor   Post-surgical  hypothyroidism    Sun-damaged skin 12/05/2014   Tubular adenoma of colon 06/2011   Unspecified constipation 05/31/2013   Vertigo    Zinc deficiency 11/26/2015   Past Surgical History:  Procedure Laterality Date   CARDIAC CATHETERIZATION N/A 03/08/2016   Procedure: Left Heart Cath and Coronary Angiography;  Surgeon: JJettie Booze MD;  [facility];  Service: Cardiovascular;  Laterality: N/A;   COLONOSCOPY  Aenomatous polyps   07/05/2011   COLONOSCOPY N/A 09/28/2014   Procedure: COLONOSCOPY;  Surgeon: MLadene Artist MD;  Location: [facility];  Service: Endoscopy;  Laterality: N/A;   CORONARY ANGIOPLASTY WITH STENT PLACEMENT     LEFT HEART CATH AND CORONARY ANGIOGRAPHY N/A 12/09/2018   Procedure: LEFT HEART CATH AND CORONARY ANGIOGRAPHY;  Surgeon: VJettie Booze MD;  [facility];  Service: Cardiovascular;  Laterality: N/A;   LEFT HEART CATHETERIZATION WITH CORONARY ANGIOGRAM N/A 07/17/2012   Procedure: LEFT HEART CATHETERIZATION WITH CORONARY ANGIOGRAM;  Surgeon: THillary Bow MD;  [facility];  Service: Cardiovascular;  Laterality: N/A;   PERCUTANEOUS CORONARY STENT INTERVENTION (PCI-S) N/A 11/19/2011   Procedure: PERCUTANEOUS CORONARY STENT INTERVENTION (PCI-S);  Surgeon: THillary Bow MD;  [facility];  Service: Cardiovascular;  Laterality: N/A;   POLYPECTOMY     SHOULDER ARTHROSCOPY W/ ROTATOR CUFF REPAIR     right   TOTAL THYROIDECTOMY  2007   GOITER   Patient Active Problem List   Diagnosis Date Noted   Acute prerenal azotemia 02/06/2023   TIA (transient ischemic attack) 02/05/2023   Paresthesia 08/28/2022   Chronic neck pain 08/28/2022   Neuropathy 08/21/2022   Vitamin deficiency 10/23/2021   Hypoglycemia 10/07/2019   Heart disease 01/29/2019   Sensorineural hearing loss (SNHL) of both ears 01/27/2019   Primary osteoarthritis of both knees 12/19/2018   DDD (degenerative disc disease), lumbar 12/19/2018    ANA positive 10/27/2018   Anxiety 02/17/2018   Peripheral arterial disease ([place]) 02/20/2017   Skin lesion of face 01/28/2017   Benign paroxysmal positional vertigo 08/05/2016   Antiplatelet or antithrombotic long-term use 03/27/2016   Right lumbar radiculopathy 02/21/2016   Abnormality of gait 02/21/2016   Zinc deficiency 11/26/2015   Right hip pain 10/26/2015   Medicare annual wellness visit, subsequent 03/13/2015   Preventative health care 03/13/2015   Sun-damaged skin 12/05/2014   Angina of effort 12/02/2014   Hx of colonic polyps 09/28/2014   Benign neoplasm of descending colon 09/28/2014   Other fatigue 09/10/2014   Personal history of colonic polyps 07/16/2014   Bilateral thumb pain 03/20/2014   Postsurgical hypoparathyroidism ([name]) 08/03/2013   Neck pain on right side 07/13/2013   Constipation 05/31/2013   Back pain 05/31/2013   Nocturia 05/31/2013   GERD (gastroesophageal reflux disease) 11/02/2012   Tension headache 11/02/2012   Anemia 06/11/2012   Hypokalemia 06/11/2012   Depression 01/21/2012   Headache(784.0) 12/23/2011   Sinus bradycardia 12/07/2011   Hypocalcemia 12/07/2011   Acute MI, subendocardial ([place]) 11/20/2011   Acquired hypothyroidism 05/20/2009   Essential hypertension, benign 05/20/2009   Hyperlipidemia 03/08/2009   CAD, NATIVE VESSEL 03/08/2009    PCP: Mosie Lukes, MD  REFERRING PROVIDER: Glenford Peers, NP  REFERRING DIAG: [phone] (ICD-10-CM) - Other spondylosis with radiculopathy, cervical region  THERAPY DIAG:  Cervicalgia  Chronic right-sided low back pain with right-sided sciatica  Abnormal posture  Cramp and spasm  Rationale for Evaluation and Treatment: Rehabilitation  ONSET DATE: chronic for years  SUBJECTIVE:                                                                                                                                                                                                         SUBJECTIVE  STATEMENT: Niyah T Ostrow reports shoulder feels much better, but she went to ED on Tuesday  about the tingling in her face, wasn't released to yesterday, so very tired today.    PERTINENT HISTORY:  Surgical hypoparathyrodism, angina (has nitro), Hypothyroidism, hyperlipidemia, HTN, neuropathy hands and feet, R RTC repair  PAIN:  Are you having pain? Yes: NPRS scale: 2-3/10 Pain location: R shoulder Pain description: sometimes sharp shooting, sometimes arms feel weak, constant pain Aggravating factors: moving head overhead, reaching behind her back, turning neck (gets dizzy),  Relieving factors: holding arms close, avoiding movement.   Are you having pain? Yes: NPRS scale: 5/10 today, pain with bending and sleeping on R side Pain location: low back, down R side to mid calf, sometimes numbness in feet with prolonged standing Pain description: pain and numbness Aggravating factors: bending over Relieving factors: laying down, icy/hot and patches, hot showers  PRECAUTIONS: None  WEIGHT BEARING RESTRICTIONS: No  FALLS:  Has patient fallen in last 6 months? No  LIVING ENVIRONMENT: Lives with: lives alone Lives in: House/apartment Stairs: No Has following equipment at home: None  OCCUPATION: retired  PLOF: Independent and Leisure: was going to [employer] but stopped due to pain  PATIENT GOALS: I want to be pain free without medications, know what machines are safe to use  NEXT MD VISIT: 02/02/23 with PCP  OBJECTIVE:   DIAGNOSTIC FINDINGS:  MRI done but not available to review  PATIENT SURVEYS:  NDI 23/50 = 46%  moderate disability   COGNITION: Overall cognitive status: Within functional limits for tasks assessed  SENSATION: Light touch: WFL  POSTURE: rounded shoulders and forward head  PALPATION: Tenderness throughout bil upper traps, levator scapulae, and cervical paraspinals, wincing and jumping away from firm touch.    CERVICAL ROM:   Active ROM AROM (deg) eval  AROM 12/19/22 AROM 01/23/23 AROM 01/31/2023  Flexion 15 22 34 20  Extension 20 25 24 20  $ Right lateral flexion 10 29    Left lateral flexion 15 32    Right rotation 40 50 45 42  Left rotation 40 50 57 35   (Blank rows = not tested)  UPPER EXTREMITY MMT:   MMT Right eval Left eval  Shoulder flexion 4+ 4+  Shoulder extension    Shoulder abduction 4+* 4  Shoulder adduction    Shoulder internal rotation 5 5  Shoulder external rotation 5 5  Elbow flexion 5 5  Elbow extension 5 5  Wrist flexion 5 5  Wrist extension 5 5   (Blank rows = not tested)  UPPER EXTREMITY ROM:  MMT Right eval Left eval  Shoulder flexion 115* 120  Shoulder extension 40 65  Shoulder abduction 100 135  Shoulder adduction    Shoulder internal rotation    Shoulder external rotation     (Blank rows = not tested)  LOWER EXTREMITY MMT:    MMT Right eval Left eval  Hip flexion 4 4  Hip extension    Hip abduction 5 5  Hip adduction 4+ 4+  Hip internal rotation    Hip external rotation    Knee flexion    Knee extension 5 5  Ankle dorsiflexion 5 5  Ankle plantarflexion     (Blank rows = not tested)   LUMBAR ROM:   Active  AROM  11/09/2022 AROM 01/01/23  Flexion To mid-thigh Mid shin  Extension WNL, no pain, but increased pain after repeated 5x Limited 50% - pain  Right lateral flexion Inc pain R side back, to knee To knee - increased pain  Left lateral flexion To mid thigh, increased pain  R side back To knee - increased pain  Right rotation WNL WNL  Left rotation WNL WNL   (Blank rows = not tested)   CERVICAL SPECIAL TESTS:  Spurling's test: Positive right  FUNCTIONAL TESTS:  5 times sit to stand: 35 seconds Increased sciatic pain with R leg extension in sitting.   5x STS: 31 seconds (01/01/23) TODAY'S TREATMENT:                                                                                                                              DATE: 02/07/2023 Therapeutic Exercise: to improve  strength and mobility.  Demo, verbal and tactile cues throughout for technique. UBE forward x 4 min Manual Therapy: to decrease muscle spasm and pain and improve mobility STM/TPR to R UT, R cervical paraspinals, R biceps, skilled palpation and monitoring during dry needling. Trigger Point Dry-Needling  Treatment instructions: Expect mild to moderate muscle soreness. S/S of pneumothorax if dry needled over a lung field, and to seek immediate medical attention should they occur. Patient verbalized understanding of these instructions and education. Patient Consent Given: Yes Education handout provided: Yes Muscles treated: R UT, R biceps Electrical stimulation performed: No Parameters: N/A Treatment response/outcome: Twitch Response Elicited and Palpable Increase in Muscle Length  02/04/2023  Therapeutic Exercise:  UBE forward x 3 min level 1 Manual Therapy: to decrease muscle spasm and pain and improve mobility STM/TPR to bil UT, cervical paraspinals, R biceps, skilled palpation and monitoring during dry needling. Trigger Point Dry-Needling  Treatment instructions: Expect mild to moderate muscle soreness. S/S of pneumothorax if dry needled over a lung field, and to seek immediate medical attention should they occur. Patient verbalized understanding of these instructions and education. Patient Consent Given: Yes Education handout provided: Yes Muscles treated: R UT Electrical stimulation performed: No Parameters: N/A Treatment response/outcome: Twitch Response Elicited and Palpable Increase in Muscle Length Ultrasound: x 8 min to R biceps 1 MHz, 1.2 w/cm2 cont to decrease inflammation/pain  01/31/23 Therapeutic Exercise: to improve strength and mobility.  Demo, verbal and tactile cues throughout for technique. Nustep L4 x 2 min  Seated shoulder isometrics - 5 x 5 sec hold, submax contraction - flexion, extension, external rotation, internal rotation, scap squeeze.  Manual Therapy: to  decrease muscle spasm and pain and improve mobility STM/TPR to R levator scapulae, cervical paraspinals, UT, deltoids, IASTM to R deltoid, biceps.  Modalities: MHP to bil UT x 10 min concurrent with manual therapy to upper arm  Therapeutic Activity:  assessment of progress towards goals.  Cervical ROM, modified [place], NDI  PATIENT EDUCATION:  Education details: HEP update 11/09/22, 01/31/23 Person educated: Patient Education method: Explanation, Demonstration, Verbal cues, and Handouts Education comprehension: verbalized understanding and returned demonstration  HOME EXERCISE PROGRAM: Access Code: [email or web address] URL: [email or web address] Date: 01/31/2023 Prepared by: Glenetta Hew  Exercises - Seated Cervical Retraction  - 3 x daily - 7 x weekly - 1 sets - 10 reps - Seated  Scapular Retraction  - 3 x daily - 7 x weekly - 1 sets - 10 reps - Seated Shoulder Rolls  - 3 x daily - 7 x weekly - 1 sets - 10 reps - Cervical Extension AROM with Strap  - 1 x daily - 7 x weekly - 1 sets - 10 reps - Seated Assisted Cervical Rotation with Towel  - 1 x daily - 7 x weekly - 1 sets - 10 reps - Standing Thoracic Open Book at [place] 1 x daily - 7 x weekly - 1 sets - 10 reps - Supine Bridge with Resistance Band  - 1 x daily - 7 x weekly - 2 sets - 10 reps - Clamshell with Resistance  - 1 x daily - 7 x weekly - 2 sets - 10 reps - Supine 90/90 Alternating Toe Touch  - 1 x daily - 7 x weekly - 2 sets - 10 reps - Supine Active Straight Leg Raise  - 1 x daily - 7 x weekly - 2 sets - 10 reps - Forward T with Counter Support  - 1 x daily - 7 x weekly - 2 sets - 10 reps - Seated Quadratus Lumborum Stretch in Chair  - 1 x daily - 7 x weekly - 3 sets - 15 sec hold - Seated Scapular Retraction  - 3 x daily - 7 x weekly - 1 sets - 5 reps - 5 sec  hold - Seated Isometric Shoulder Internal Rotation with Towel  - 3 x daily - 7 x weekly - 1 sets - 5 reps - 5 sec hold - Seated Isometric Shoulder External  Rotation  - 3 x daily - 7 x weekly - 1 sets - 5 reps - 5 sec hold - Isometric Shoulder Flexion at Wall  - 3 x daily - 7 x weekly - 1 sets - 5 reps - 5 sec hold - Isometric Shoulder Extension at Wall  - 3 x daily - 7 x weekly - 1 sets - 5 reps - 5 sec hold  ASSESSMENT:  CLINICAL IMPRESSION: Lorielle T Spitler reports significant improvement in R shoulder pain today.  She primarily wanted to focus on repeating interventions performed last session, including TrDN, due to fatigue.  She was also concerned that massage could be causing the numbness in her face on Left, although symptoms were occurring before she came Monday, but focused all manual therapy on R side only today.  Also reviewed labs, major finding was TSH, discussed  how hypothyroidism could affect joint/muscle pain and emphasized importance of taking medication regularly to normalize thyroid levels.  Milli T Radwan continues to demonstrate potential for improvement and would benefit from continued skilled therapy to address impairments.     OBJECTIVE IMPAIRMENTS: decreased activity tolerance, decreased mobility, decreased ROM, decreased strength, dizziness, hypomobility, increased fascial restrictions, impaired perceived functional ability, increased muscle spasms, impaired flexibility, postural dysfunction, and pain.   ACTIVITY LIMITATIONS: carrying, lifting, bending, standing, sleeping, stairs, transfers, dressing, reach over head, and hygiene/grooming  PARTICIPATION LIMITATIONS: meal prep, cleaning, laundry, driving, shopping, and community activity  PERSONAL FACTORS: Age, Time since onset of injury/illness/exacerbation, and 3+ comorbidities: angina, hypothyroidism, neck surgery, neuropathy, R RTC repair, osteoporosis, CAD, history MI, BPPV, chronic low back pain  are also affecting patient's functional outcome.   REHAB POTENTIAL: Good  CLINICAL DECISION MAKING: Evolving/moderate complexity  EVALUATION COMPLEXITY:  Moderate   GOALS: Goals reviewed with patient? Yes  SHORT TERM GOALS: Target date: 11/20/2022  Patient will be independent with initial HEP.  Baseline: given Goal status: MET - 11/21/22   LONG TERM GOALS: Target date: 02/28/2023   Patient will be independent with advanced/ongoing HEP to improve outcomes and carryover.  Baseline:  Goal status: IN PROGRESS 01/31/23- good compliance, advanced today  2.  Patient will report 75% improvement in neck pain to improve QOL.  Baseline: severe Goal status: IN PROGRESS- 12/19/22 65% improvement 01/31/2023- 70% improvement.   3.  Patient will demonstrate improved cervical ROM by 10 deg all directions for safety with driving.  Baseline: see objective Goal status: IN PROGRESS-01/31/23- decreased due to shoulder pain  4.  Patient will report 15% improvement on NDI to demonstrate improved functional ability.  Baseline: 46% Goal status: IN PROGRESS 01/31/23- 34%  5.  Patient will report 75% improvement in low back pain to improve quality of life. Baseline:   Goal status: MET- 11/27/22  60-70% ; 01/31/23- 75%  6. Patient will report 12% improvement on modified Oswestry to demonstrate improved functional ability. Baseline: 12/19/22 -30% Goal status: MET   01/31/23- 18% impairment.   7. Patient will will be able to return to community-based exercise program to maintain progress. Baseline: stopped exercising due to pain Goal status: IN PROGRESS    PLAN:  PT FREQUENCY: 1-2x/week  PT DURATION: 4 weeks  PLANNED INTERVENTIONS: Therapeutic exercises, Therapeutic activity, Neuromuscular re-education, Balance training, Gait training, Patient/Family education, Self Care, Joint mobilization, Stair training, Dry Needling, Electrical stimulation, Spinal mobilization, Cryotherapy, Moist heat, Taping, Traction, Ultrasound, Manual therapy, and Re-evaluation  PLAN FOR NEXT SESSION: continue to progress postural exercises as tolerated, manual therapy, modalities  PRN.    Rennie Natter, PT, DPT  02/07/2023, 3:14 PM

## 2023-02-10 NOTE — Assessment & Plan Note (Signed)
Supplement and monitor 

## 2023-02-10 NOTE — Assessment & Plan Note (Signed)
Well controlled, no changes to meds. Encouraged heart healthy diet such as the DASH diet and exercise as tolerated.  

## 2023-02-10 NOTE — Assessment & Plan Note (Signed)
On Levothyroxine, continue to monitor 

## 2023-02-10 NOTE — Assessment & Plan Note (Addendum)
Presented to hospital last week with left sided facial numbness but imaging CT and MRI were negative, she is feeling well and has had no recurrence. No changes. Continue current meds, follow up with neurology

## 2023-02-10 NOTE — Assessment & Plan Note (Signed)
Encourage heart healthy diet such as MIND or DASH diet, increase exercise, avoid trans fats, simple carbohydrates and processed foods, consider a krill or fish or flaxseed oil cap daily.  Tolerating Rosuvastatin

## 2023-02-11 ENCOUNTER — Ambulatory Visit (INDEPENDENT_AMBULATORY_CARE_PROVIDER_SITE_OTHER): Payer: 59 | Admitting: Family Medicine

## 2023-02-11 ENCOUNTER — Other Ambulatory Visit: Payer: Self-pay

## 2023-02-11 VITALS — BP 145/70 | HR 66 | Temp 98.0°F | Resp 16 | Ht 65.0 in | Wt 190.0 lb

## 2023-02-11 DIAGNOSIS — G459 Transient cerebral ischemic attack, unspecified: Secondary | ICD-10-CM | POA: Diagnosis not present

## 2023-02-11 DIAGNOSIS — E782 Mixed hyperlipidemia: Secondary | ICD-10-CM

## 2023-02-11 DIAGNOSIS — I10 Essential (primary) hypertension: Secondary | ICD-10-CM

## 2023-02-11 DIAGNOSIS — E890 Postprocedural hypothyroidism: Secondary | ICD-10-CM

## 2023-02-11 DIAGNOSIS — E60 Dietary zinc deficiency: Secondary | ICD-10-CM

## 2023-02-11 DIAGNOSIS — R609 Edema, unspecified: Secondary | ICD-10-CM | POA: Diagnosis not present

## 2023-02-11 DIAGNOSIS — E039 Hypothyroidism, unspecified: Secondary | ICD-10-CM | POA: Diagnosis not present

## 2023-02-11 MED ORDER — LEVOTHYROXINE SODIUM 125 MCG PO TABS: 125.0000 ug | ORAL_TABLET | Freq: Every day | ORAL | 1 refills | Status: DC

## 2023-02-11 MED ORDER — FUROSEMIDE 20 MG PO TABS: ORAL_TABLET | ORAL | 1 refills | Status: DC

## 2023-02-11 MED ORDER — AMLODIPINE BESYLATE 10 MG PO TABS: 10.0000 mg | ORAL_TABLET | Freq: Every day | ORAL | 2 refills | Status: DC

## 2023-02-11 NOTE — Patient Instructions (Signed)

## 2023-02-12 ENCOUNTER — Ambulatory Visit: Payer: 59 | Admitting: Physical Therapy

## 2023-02-12 ENCOUNTER — Encounter: Payer: Self-pay | Admitting: Gastroenterology

## 2023-02-12 ENCOUNTER — Encounter: Payer: Self-pay | Admitting: Physical Therapy

## 2023-02-12 ENCOUNTER — Other Ambulatory Visit: Payer: Self-pay | Admitting: Family Medicine

## 2023-02-12 DIAGNOSIS — R252 Cramp and spasm: Secondary | ICD-10-CM

## 2023-02-12 DIAGNOSIS — M542 Cervicalgia: Secondary | ICD-10-CM

## 2023-02-12 DIAGNOSIS — G8929 Other chronic pain: Secondary | ICD-10-CM | POA: Diagnosis not present

## 2023-02-12 DIAGNOSIS — R293 Abnormal posture: Secondary | ICD-10-CM | POA: Diagnosis not present

## 2023-02-12 DIAGNOSIS — H8110 Benign paroxysmal vertigo, unspecified ear: Secondary | ICD-10-CM

## 2023-02-12 DIAGNOSIS — M5441 Lumbago with sciatica, right side: Secondary | ICD-10-CM | POA: Diagnosis not present

## 2023-02-12 NOTE — Therapy (Signed)
OUTPATIENT PHYSICAL THERAPY TREATMENT   Patient Name: [name] MRN: [id number] DOB:01-16-1946, 77 y.o., female Today's Date: 02/12/2023   PT End of Session - 02/12/23 1413     Visit Number 19    Number of Visits 25    Date for PT Re-Evaluation 02/28/23    Authorization Type [employer] Medicare    Progress Note Due on Visit 25    PT Start Time 1407    PT Stop Time 1440    PT Time Calculation (min) 33 min    Activity Tolerance Patient tolerated treatment well    Behavior During Therapy [facility] for tasks assessed/performed                       Past Medical History:  Diagnosis Date   Acute MI, subendocardial ([place])    Arthritis    Back pain 05/31/2013   Benign paroxysmal positional vertigo 08/05/2016   CAD (coronary artery disease)    a. NSTEMI 10/2011: [place] 11/19/11: pLAD 30%, oOM 60%, AVCFX 30%, CFX after OM2 30%, pRCA 60 and 70%, then 99%, AM 80-90% with TIMI 3 flow.  PCI: Promus DES x 2 to RCA; b. 06/2012 Cath: patent RCA stents w/ subtl occl of Acute Marginal (jailed)->Med rx; c. 05/2015 MV: EF 59%, mod mid infsept/inf/ap lat/ap infarct with peri-infarct isch-->Med Rx; d. 02/2016 Cath: LM nl, LAD 28m RI 50, RCA patent stents.   Colitis    Facial skin lesion 01/28/2017   GERD (gastroesophageal reflux disease)    occasional   Hyperlipidemia    Hypertension    Hypertensive heart disease    a. Echocardiogram 11/19/11: Difficult acoustic windows, EF 60-65%, normal LV wall thickness, grade 1 diastolic dysfunction   Hypoparathyroidism (HCC)    Low zinc level 11/26/2016   Multinodular thyroid    Goiter s/p thyroidectomy in 2007 with post-op  hypocalcemia and post-op hypothyroidism/hypoparathyroidism with hypocalcemia   Neck pain on right side 07/13/2013   Nocturia 05/31/2013   Osteoarthritis    Pain in right axilla 03/13/2016   Palpitations    a. 03/2012 - patient set up for event monitor but did not wear correctly -  she declined wearing a repeat monitor   Post-surgical  hypothyroidism    Sun-damaged skin 12/05/2014   Tubular adenoma of colon 06/2011   Unspecified constipation 05/31/2013   Vertigo    Zinc deficiency 11/26/2015   Past Surgical History:  Procedure Laterality Date   CARDIAC CATHETERIZATION N/A 03/08/2016   Procedure: Left Heart Cath and Coronary Angiography;  Surgeon: JJettie Booze MD;  [facility];  Service: Cardiovascular;  Laterality: N/A;   COLONOSCOPY  Aenomatous polyps   07/05/2011   COLONOSCOPY N/A 09/28/2014   Procedure: COLONOSCOPY;  Surgeon: MLadene Artist MD;  Location: [facility];  Service: Endoscopy;  Laterality: N/A;   CORONARY ANGIOPLASTY WITH STENT PLACEMENT     LEFT HEART CATH AND CORONARY ANGIOGRAPHY N/A 12/09/2018   Procedure: LEFT HEART CATH AND CORONARY ANGIOGRAPHY;  Surgeon: VJettie Booze MD;  [facility];  Service: Cardiovascular;  Laterality: N/A;   LEFT HEART CATHETERIZATION WITH CORONARY ANGIOGRAM N/A 07/17/2012   Procedure: LEFT HEART CATHETERIZATION WITH CORONARY ANGIOGRAM;  Surgeon: THillary Bow MD;  [facility];  Service: Cardiovascular;  Laterality: N/A;   PERCUTANEOUS CORONARY STENT INTERVENTION (PCI-S) N/A 11/19/2011   Procedure: PERCUTANEOUS CORONARY STENT INTERVENTION (PCI-S);  Surgeon: THillary Bow MD;  [facility];  Service: Cardiovascular;  Laterality: N/A;   POLYPECTOMY     SHOULDER ARTHROSCOPY W/ ROTATOR CUFF REPAIR     right   TOTAL THYROIDECTOMY  2007   GOITER   Patient Active Problem List   Diagnosis Date Noted   Acute prerenal azotemia 02/06/2023   TIA (transient ischemic attack) 02/05/2023   Paresthesia 08/28/2022   Chronic neck pain 08/28/2022   Neuropathy 08/21/2022   Vitamin deficiency 10/23/2021   Hypoglycemia 10/07/2019   Heart disease 01/29/2019   Sensorineural hearing loss (SNHL) of both ears 01/27/2019   Primary osteoarthritis of both knees 12/19/2018   DDD (degenerative disc disease), lumbar 12/19/2018    ANA positive 10/27/2018   Anxiety 02/17/2018   Peripheral arterial disease ([place]) 02/20/2017   Skin lesion of face 01/28/2017   Benign paroxysmal positional vertigo 08/05/2016   Antiplatelet or antithrombotic long-term use 03/27/2016   Right lumbar radiculopathy 02/21/2016   Abnormality of gait 02/21/2016   Zinc deficiency 11/26/2015   Right hip pain 10/26/2015   Medicare annual wellness visit, subsequent 03/13/2015   Preventative health care 03/13/2015   Sun-damaged skin 12/05/2014   Angina of effort 12/02/2014   Hx of colonic polyps 09/28/2014   Benign neoplasm of descending colon 09/28/2014   Other fatigue 09/10/2014   Personal history of colonic polyps 07/16/2014   Bilateral thumb pain 03/20/2014   Postsurgical hypoparathyroidism ([place]) 08/03/2013   Neck pain on right side 07/13/2013   Constipation 05/31/2013   Back pain 05/31/2013   Nocturia 05/31/2013   GERD (gastroesophageal reflux disease) 11/02/2012   Tension headache 11/02/2012   Anemia 06/11/2012   Hypokalemia 06/11/2012   Depression 01/21/2012   Headache(784.0) 12/23/2011   Sinus bradycardia 12/07/2011   Hypocalcemia 12/07/2011   Acute MI, subendocardial ([place]) 11/20/2011   Acquired hypothyroidism 05/20/2009   Essential hypertension, benign 05/20/2009   Hyperlipidemia 03/08/2009   CAD, NATIVE VESSEL 03/08/2009    PCP: Mosie Lukes, MD  REFERRING PROVIDER: Glenford Peers, NP  REFERRING DIAG: [phone] (ICD-10-CM) - Other spondylosis with radiculopathy, cervical region  THERAPY DIAG:  Cervicalgia  Chronic right-sided low back pain with right-sided sciatica  Abnormal posture  Cramp and spasm  Rationale for Evaluation and Treatment: Rehabilitation  ONSET DATE: chronic for years  SUBJECTIVE:                                                                                                                                                                                                         SUBJECTIVE  STATEMENT: [name] reports feeling significantly better today, no pain in her shoulder, back is  a little bit achy.  Said that her PCP told her to continue therapy, and some of the tingling her in face could be coming from her neck.   PERTINENT HISTORY:  Surgical hypoparathyrodism, angina (has nitro), Hypothyroidism, hyperlipidemia, HTN, neuropathy hands and feet, R RTC repair  PAIN:  Are you having pain? Yes: NPRS scale: 2-3/10 Pain location: R shoulder Pain description: sometimes sharp shooting, sometimes arms feel weak, constant pain Aggravating factors: moving head overhead, reaching behind her back, turning neck (gets dizzy),  Relieving factors: holding arms close, avoiding movement.   Are you having pain? Yes: NPRS scale: 2/10 Pain location: low back, down R side to mid calf, sometimes numbness in feet with prolonged standing Pain description: pain and numbness Aggravating factors: bending over Relieving factors: laying down, icy/hot and patches, hot showers  PRECAUTIONS: None  WEIGHT BEARING RESTRICTIONS: No  FALLS:  Has patient fallen in last 6 months? No  LIVING ENVIRONMENT: Lives with: lives alone Lives in: House/apartment Stairs: No Has following equipment at home: None  OCCUPATION: retired  PLOF: Independent and Leisure: was going to [employer] but stopped due to pain  PATIENT GOALS: I want to be pain free without medications, know what machines are safe to use  NEXT MD VISIT: 02/02/23 with PCP  OBJECTIVE:   DIAGNOSTIC FINDINGS:  MRI done but not available to review  PATIENT SURVEYS:  NDI 23/50 = 46%  moderate disability   COGNITION: Overall cognitive status: Within functional limits for tasks assessed  SENSATION: Light touch: WFL  POSTURE: rounded shoulders and forward head  PALPATION: Tenderness throughout bil upper traps, levator scapulae, and cervical paraspinals, wincing and jumping away from firm touch.    CERVICAL ROM:   Active ROM AROM  (deg) eval AROM 12/19/22 AROM 01/23/23 AROM 01/31/2023  Flexion 15 22 34 20  Extension 20 25 24 20  $ Right lateral flexion 10 29    Left lateral flexion 15 32    Right rotation 40 50 45 42  Left rotation 40 50 57 35   (Blank rows = not tested)  UPPER EXTREMITY MMT:   MMT Right eval Left eval  Shoulder flexion 4+ 4+  Shoulder extension    Shoulder abduction 4+* 4  Shoulder adduction    Shoulder internal rotation 5 5  Shoulder external rotation 5 5  Elbow flexion 5 5  Elbow extension 5 5  Wrist flexion 5 5  Wrist extension 5 5   (Blank rows = not tested)  UPPER EXTREMITY ROM:  MMT Right eval Left eval  Shoulder flexion 115* 120  Shoulder extension 40 65  Shoulder abduction 100 135  Shoulder adduction    Shoulder internal rotation    Shoulder external rotation     (Blank rows = not tested)  LOWER EXTREMITY MMT:    MMT Right eval Left eval  Hip flexion 4 4  Hip extension    Hip abduction 5 5  Hip adduction 4+ 4+  Hip internal rotation    Hip external rotation    Knee flexion    Knee extension 5 5  Ankle dorsiflexion 5 5  Ankle plantarflexion     (Blank rows = not tested)   LUMBAR ROM:   Active  AROM  11/09/2022 AROM 01/01/23  Flexion To mid-thigh Mid shin  Extension WNL, no pain, but increased pain after repeated 5x Limited 50% - pain  Right lateral flexion Inc pain R side back, to knee To knee - increased pain  Left lateral flexion To  mid thigh, increased pain R side back To knee - increased pain  Right rotation WNL WNL  Left rotation WNL WNL   (Blank rows = not tested)   CERVICAL SPECIAL TESTS:  Spurling's test: Positive right  FUNCTIONAL TESTS:  5 times sit to stand: 35 seconds Increased sciatic pain with R leg extension in sitting.   5x STS: 31 seconds (01/01/23) TODAY'S TREATMENT:                                                                                                                              DATE:  02/12/23  Therapeutic Exercise:  to improve strength and mobility.  Demo, verbal and tactile cues throughout for technique. UBE forward x 4 min, back x 1 min - increased R shoulder pain Review of HEP Manual Therapy: to decrease muscle spasm and pain and improve mobility STM/TPR to cervical paraspinals, PA mobs to cervical spine, SNAGs into rotation, gentle traction holds.  -reported increased vertigo with L head turns.     02/07/2023 Therapeutic Exercise: to improve strength and mobility.  Demo, verbal and tactile cues throughout for technique. UBE forward x 4 min Manual Therapy: to decrease muscle spasm and pain and improve mobility STM/TPR to R UT, R cervical paraspinals, R biceps, skilled palpation and monitoring during dry needling. Trigger Point Dry-Needling  Treatment instructions: Expect mild to moderate muscle soreness. S/S of pneumothorax if dry needled over a lung field, and to seek immediate medical attention should they occur. Patient verbalized understanding of these instructions and education. Patient Consent Given: Yes Education handout provided: Yes Muscles treated: R UT, R biceps Electrical stimulation performed: No Parameters: N/A Treatment response/outcome: Twitch Response Elicited and Palpable Increase in Muscle Length  02/04/2023  Therapeutic Exercise:  UBE forward x 3 min level 1 Manual Therapy: to decrease muscle spasm and pain and improve mobility STM/TPR to bil UT, cervical paraspinals, R biceps, skilled palpation and monitoring during dry needling. Trigger Point Dry-Needling  Treatment instructions: Expect mild to moderate muscle soreness. S/S of pneumothorax if dry needled over a lung field, and to seek immediate medical attention should they occur. Patient verbalized understanding of these instructions and education. Patient Consent Given: Yes Education handout provided: Yes Muscles treated: R UT Electrical stimulation performed: No Parameters: N/A Treatment response/outcome: Twitch Response  Elicited and Palpable Increase in Muscle Length Ultrasound: x 8 min to R biceps 1 MHz, 1.2 w/cm2 cont to decrease inflammation/pain  01/31/23 Therapeutic Exercise: to improve strength and mobility.  Demo, verbal and tactile cues throughout for technique. Nustep L4 x 2 min  Seated shoulder isometrics - 5 x 5 sec hold, submax contraction - flexion, extension, external rotation, internal rotation, scap squeeze.  Manual Therapy: to decrease muscle spasm and pain and improve mobility STM/TPR to R levator scapulae, cervical paraspinals, UT, deltoids, IASTM to R deltoid, biceps.  Modalities: MHP to bil UT x 10 min concurrent with manual therapy to upper arm  Therapeutic Activity:  assessment of progress towards goals.  Cervical ROM, modified [place], NDI  PATIENT EDUCATION:  Education details: HEP update 11/09/22, 01/31/23 Person educated: Patient Education method: Explanation, Demonstration, Verbal cues, and Handouts Education comprehension: verbalized understanding and returned demonstration  HOME EXERCISE PROGRAM: Access Code: [email or web address] URL: [email or web address] Date: 01/31/2023 Prepared by: Glenetta Hew  Exercises - Seated Cervical Retraction  - 3 x daily - 7 x weekly - 1 sets - 10 reps - Seated Scapular Retraction  - 3 x daily - 7 x weekly - 1 sets - 10 reps - Seated Shoulder Rolls  - 3 x daily - 7 x weekly - 1 sets - 10 reps - Cervical Extension AROM with Strap  - 1 x daily - 7 x weekly - 1 sets - 10 reps - Seated Assisted Cervical Rotation with Towel  - 1 x daily - 7 x weekly - 1 sets - 10 reps - Standing Thoracic Open Book at [place]  - 1 x daily - 7 x weekly - 1 sets - 10 reps - Supine Bridge with Resistance Band  - 1 x daily - 7 x weekly - 2 sets - 10 reps - Clamshell with Resistance  - 1 x daily - 7 x weekly - 2 sets - 10 reps - Supine 90/90 Alternating Toe Touch  - 1 x daily - 7 x weekly - 2 sets - 10 reps - Supine Active Straight Leg Raise  - 1 x daily - 7 x weekly  - 2 sets - 10 reps - Forward T with Counter Support  - 1 x daily - 7 x weekly - 2 sets - 10 reps - Seated Quadratus Lumborum Stretch in Chair  - 1 x daily - 7 x weekly - 3 sets - 15 sec hold - Seated Scapular Retraction  - 3 x daily - 7 x weekly - 1 sets - 5 reps - 5 sec  hold - Seated Isometric Shoulder Internal Rotation with Towel  - 3 x daily - 7 x weekly - 1 sets - 5 reps - 5 sec hold - Seated Isometric Shoulder External Rotation  - 3 x daily - 7 x weekly - 1 sets - 5 reps - 5 sec hold - Isometric Shoulder Flexion at Wall  - 3 x daily - 7 x weekly - 1 sets - 5 reps - 5 sec hold - Isometric Shoulder Extension at Wall  - 3 x daily - 7 x weekly - 1 sets - 5 reps - 5 sec hold  ASSESSMENT:  CLINICAL IMPRESSION: Sheniqua T Fawbush reports continued improvement of R shoulder pain.  Today when supine for manual therapy reported increased vertigo symptoms with L head turn (suspect horizontal canalithiasis) discussed treatement for it, she is willing to ask PCP for referral for vestibular rehab.   [name] continues to demonstrate potential for improvement and would benefit from continued skilled therapy to address impairments.     OBJECTIVE IMPAIRMENTS: decreased activity tolerance, decreased mobility, decreased ROM, decreased strength, dizziness, hypomobility, increased fascial restrictions, impaired perceived functional ability, increased muscle spasms, impaired flexibility, postural dysfunction, and pain.   ACTIVITY LIMITATIONS: carrying, lifting, bending, standing, sleeping, stairs, transfers, dressing, reach over head, and hygiene/grooming  PARTICIPATION LIMITATIONS: meal prep, cleaning, laundry, driving, shopping, and community activity  PERSONAL FACTORS: Age, Time since onset of injury/illness/exacerbation, and 3+ comorbidities: angina, hypothyroidism, neck surgery, neuropathy, R RTC repair, osteoporosis, CAD, history MI, BPPV, chronic low back pain  are also affecting patient's  functional outcome.  REHAB POTENTIAL: Good  CLINICAL DECISION MAKING: Evolving/moderate complexity  EVALUATION COMPLEXITY: Moderate   GOALS: Goals reviewed with patient? Yes  SHORT TERM GOALS: Target date: 11/20/2022   Patient will be independent with initial HEP.  Baseline: given Goal status: MET - 11/21/22   LONG TERM GOALS: Target date: 02/28/2023   Patient will be independent with advanced/ongoing HEP to improve outcomes and carryover.  Baseline:  Goal status: IN PROGRESS 01/31/23- good compliance, advanced today  2.  Patient will report 75% improvement in neck pain to improve QOL.  Baseline: severe Goal status: IN PROGRESS- 12/19/22 65% improvement 01/31/2023- 70% improvement.   3.  Patient will demonstrate improved cervical ROM by 10 deg all directions for safety with driving.  Baseline: see objective Goal status: IN PROGRESS-01/31/23- decreased due to shoulder pain  4.  Patient will report 15% improvement on NDI to demonstrate improved functional ability.  Baseline: 46% Goal status: IN PROGRESS 01/31/23- 34%  5.  Patient will report 75% improvement in low back pain to improve quality of life. Baseline:   Goal status: MET- 11/27/22  60-70% ; 01/31/23- 75%  6. Patient will report 12% improvement on modified Oswestry to demonstrate improved functional ability. Baseline: 12/19/22 -30% Goal status: MET   01/31/23- 18% impairment.   7. Patient will will be able to return to community-based exercise program to maintain progress. Baseline: stopped exercising due to pain Goal status: IN PROGRESS    PLAN:  PT FREQUENCY: 1-2x/week  PT DURATION: 4 weeks  PLANNED INTERVENTIONS: Therapeutic exercises, Therapeutic activity, Neuromuscular re-education, Balance training, Gait training, Patient/Family education, Self Care, Joint mobilization, Stair training, Dry Needling, Electrical stimulation, Spinal mobilization, Cryotherapy, Moist heat, Taping, Traction, Ultrasound, Manual  therapy, and Re-evaluation  PLAN FOR NEXT SESSION: continue to progress postural exercises as tolerated, manual therapy, modalities PRN.    Rennie Natter, PT, DPT  02/12/2023, 6:32 PM

## 2023-02-17 DIAGNOSIS — R609 Edema, unspecified: Secondary | ICD-10-CM | POA: Insufficient documentation

## 2023-02-17 NOTE — Progress Notes (Unsigned)
[place] Phone: [phone] Subjective:   [name], am serving as a scribe for Dr. Hulan Saas.  I'm seeing this patient by the request  of:  Mosie Lukes, MD, Dorris Carnes MD   CC: Back pain  [id number]  [name] is a 77 y.o. female coming in with complaint of back pain for past 3 years. Patient said that she has seen many providers and her back pain has not improved. Pain worse at night and will travel down the lateral aspect of right leg. Patient was getting epidurals from Dr. Nelva Bush for years. Epidural injections were helpufl but she is worried about chronic steroid. Uses Tylenol and Icy Hot.      Patient previously did have an MRI of the lumbar spine on July 24, 2022 that was independently visualized by me.  Does have likely an left-sided L4 nerve impingement secondary to disc bulging and degenerative disc disease at L4-L5 reviewing patient's chart patient has been in rehabilitation for quite some time now.  Patient is on Plavix.    Past Medical History:  Diagnosis Date   Acute MI, subendocardial ([place])    Arthritis    Back pain 05/31/2013   Benign paroxysmal positional vertigo 08/05/2016   CAD (coronary artery disease)    a. NSTEMI 10/2011: [place] 11/19/11: pLAD 30%, oOM 60%, AVCFX 30%, CFX after OM2 30%, pRCA 60 and 70%, then 99%, AM 80-90% with TIMI 3 flow.  PCI: Promus DES x 2 to RCA; b. 06/2012 Cath: patent RCA stents w/ subtl occl of Acute Marginal (jailed)->Med rx; c. 05/2015 MV: EF 59%, mod mid infsept/inf/ap lat/ap infarct with peri-infarct isch-->Med Rx; d. 02/2016 Cath: LM nl, LAD 76m RI 50, RCA patent stents.   Colitis    Facial skin lesion 01/28/2017   GERD (gastroesophageal reflux disease)    occasional   Hyperlipidemia    Hypertension    Hypertensive heart disease    a. Echocardiogram 11/19/11: Difficult acoustic windows, EF 60-65%, normal LV wall thickness, grade 1 diastolic  dysfunction   Hypoparathyroidism (HCC)    Low zinc level 11/26/2016   Multinodular thyroid    Goiter s/p thyroidectomy in 2007 with post-op  hypocalcemia and post-op hypothyroidism/hypoparathyroidism with hypocalcemia   Neck pain on right side 07/13/2013   Nocturia 05/31/2013   Osteoarthritis    Pain in right axilla 03/13/2016   Palpitations    a. 03/2012 - patient set up for event monitor but did not wear correctly -  she declined wearing a repeat monitor   Post-surgical hypothyroidism    Sun-damaged skin 12/05/2014   Tubular adenoma of colon 06/2011   Unspecified constipation 05/31/2013   Vertigo    Zinc deficiency 11/26/2015   Past Surgical History:  Procedure Laterality Date   CARDIAC CATHETERIZATION N/A 03/08/2016   Procedure: Left Heart Cath and Coronary Angiography;  Surgeon: JJettie Booze MD;  [facility];  Service: Cardiovascular;  Laterality: N/A;   COLONOSCOPY  Aenomatous polyps   07/05/2011   COLONOSCOPY N/A 09/28/2014   Procedure: COLONOSCOPY;  Surgeon: MLadene Artist MD;  [facility]: [facility];  Service: Endoscopy;  Laterality: N/A;   CORONARY ANGIOPLASTY WITH STENT PLACEMENT     LEFT HEART CATH AND CORONARY ANGIOGRAPHY N/A 12/09/2018   Procedure: LEFT HEART CATH AND CORONARY ANGIOGRAPHY;  Surgeon: VJettie Booze MD;  [facility];  Service: Cardiovascular;  Laterality: N/A;   LEFT HEART  CATHETERIZATION WITH CORONARY ANGIOGRAM N/A 07/17/2012   Procedure: LEFT HEART CATHETERIZATION WITH CORONARY ANGIOGRAM;  Surgeon: Hillary Bow, MD;  Location: [facility] CATH LAB;  Service: Cardiovascular;  Laterality: N/A;   PERCUTANEOUS CORONARY STENT INTERVENTION (PCI-S) N/A 11/19/2011   Procedure: PERCUTANEOUS CORONARY STENT INTERVENTION (PCI-S);  Surgeon: Hillary Bow, MD;  Location: [facility] CATH LAB;  Service: Cardiovascular;  Laterality: N/A;   POLYPECTOMY     SHOULDER ARTHROSCOPY W/ ROTATOR CUFF REPAIR     right   TOTAL THYROIDECTOMY  2007    GOITER   Social History   Socioeconomic History   Marital status: Married    Spouse name: Not on file   Number of children: 4   Years of education: 14   Highest education level: Not on file  Occupational History   Occupation: Sales person at [employer]   Smoking status: Never   Smokeless tobacco: Never  Vaping Use   Vaping Use: Never used  Substance and Sexual Activity   Alcohol use: Not Currently   Drug use: Rice   Sexual activity: Not Currently  Other Topics Concern   Not on file  Social History Narrative   Lives at home alone.  Her son lives near her.   Right-handed.   1 cup coffee per day.   Social Determinants of Health   Financial Resource Strain: Low Risk  (05/07/2022)   Overall Financial Resource Strain (CARDIA)    Difficulty of Paying Living Expenses: Not hard at all  Food Insecurity: Rice Food Insecurity (02/06/2023)   Hunger Vital Sign    Worried About Running Out of Food in the Last Year: Never true    Ran Out of Food in the Last Year: Never true  Transportation Needs: Rice Transportation Needs (02/06/2023)   PRAPARE - Hydrologist (Medical): Rice    Lack of Transportation (Non-Medical): Rice  Physical Activity: Sufficiently Active (05/07/2022)   Exercise Vital Sign    Days of Exercise per Week: 7 days    Minutes of Exercise per Session: 30 min  Stress: Rice Stress Concern Present (05/07/2022)   [place]    Feeling of Stress : Not at all  Social Connections: Not on file   Allergies  Allergen Reactions   Zetia [Ezetimibe] Other (See Comments)    Myalgia, paresthesias and weakness   Penicillin G Rash   Family History  Problem Relation Age of Onset   Colon cancer Brother    Cancer Brother        COLON   Hypertension Father    Heart disease Father    Heart attack Father    Blindness Sister    Congestive Heart Failure Sister    Hypertension Sister    Healthy  Daughter    Healthy Son    Thyroid disease Sister    Cancer Brother        multiple myelomas   Healthy Daughter    Healthy Daughter    Diabetes Neg Hx    Prostate cancer Neg Hx    Breast cancer Neg Hx    Esophageal cancer Neg Hx    Rectal cancer Neg Hx    Stomach cancer Neg Hx     Current Outpatient Medications (Endocrine & Metabolic):    calcitRIOL (ROCALTROL) 0.25 MCG capsule, TAKE 3 CAPSULES BY MOUTH DAILY   levothyroxine (SYNTHROID) 125 MCG tablet, Take 1 tablet (125 mcg total) by mouth daily before breakfast.  Current Outpatient Medications (Cardiovascular):    amLODipine (NORVASC) 10 MG tablet, Take 1 tablet (10 mg total) by mouth daily.   bisoprolol (ZEBETA) 5 MG tablet, Take 1 tablet (5 mg total) by mouth daily.   furosemide (LASIX) 20 MG tablet, 1 tab po daily prn severe pedal edema   isosorbide mononitrate (IMDUR) 60 MG 24 hr tablet, Take 1 tablet (60 mg total) by mouth daily.   nitroGLYCERIN (NITROSTAT) 0.4 MG SL tablet, Place 1 tablet (0.4 mg total) under the tongue every 5 (five) minutes as needed for chest pain.   REPATHA SURECLICK [id number] MG/ML SOAJ, INJECT '140MG'$  SUBCUTANEOUSLY EVERY 2 WEEKS   rosuvastatin (CRESTOR) 40 MG tablet, TAKE 1 TABLET BY MOUTH DAILY   Current Outpatient Medications (Analgesics):    aspirin EC 81 MG tablet, Take 81 mg by mouth daily.  Current Outpatient Medications (Hematological):    clopidogrel (PLAVIX) 75 MG tablet, Take 1 tablet (75 mg total) by mouth daily.  Current Outpatient Medications (Other):    B Complex-C (B-COMPLEX WITH VITAMIN C) tablet, Take 1 tablet by mouth daily.   Cholecalciferol (VITAMIN D) 125 MCG (5000 UT) CAPS, Take 5,000 Units by mouth daily in the afternoon.   ciclopirox (PENLAC) 8 % solution, Apply topically at bedtime. Apply over nail and surrounding skin. Apply daily over previous coat. After seven (7) days, may remove with alcohol and continue cycle.   diazepam (VALIUM) 5 MG tablet, Take 1 tablet (5 mg total) by  mouth daily as needed for anxiety (MRI). Take 1 one hour prior to MRI and another when arrive   gabapentin (NEURONTIN) 100 MG capsule, Take 2 capsules (200 mg total) by mouth at bedtime.   Multiple Vitamin (MULTIVITAMIN WITH MINERALS) TABS tablet, Take 1 tablet by mouth daily.   pantoprazole (PROTONIX) 40 MG tablet, TAKE 1 TABLET BY MOUTH  DAILY   urea (CARMOL) 10 % cream, Apply topically as needed.   Reviewed prior external information including notes and imaging from  primary care provider As well as notes that were available from care everywhere and other healthcare systems.  Past medical history, social, surgical and family history all reviewed in electronic medical record.  Rice pertanent information unless stated regarding to the chief complaint.   Review of Systems:  Rice headache, visual changes, nausea, vomiting, diarrhea, constipation, dizziness, abdominal pain, skin rash, fevers, chills, night sweats, weight loss, swollen lymph nodes, body aches, joint swelling, chest pain, shortness of breath, mood changes. POSITIVE muscle aches  Objective  Blood pressure 132/62, pulse (!) 54, height '5\' 5"'$  (1.651 m), weight 190 lb (86.2 kg), SpO2 96 %.   General: Rice apparent distress alert and oriented x3 mood and affect normal, dressed appropriately.  HEENT: Pupils equal, extraocular movements intact  Respiratory: Patient's speak in full sentences and does not appear short of breath  Cardiovascular: Rice lower extremity edema, non tender, Rice erythema  Low back pain he does have some loss of lordosis.  Some tenderness to palpation in the paraspinal musculature.  Patient does have an antalgic gait noted.  Negative right straight leg but patient does have significant tightness of the hamstring right greater than left. Patient does have arthritic changes noted of the knees as well.  Neurovascularly intact distally at this time.  [id number]; 15 additional minutes spent for Therapeutic exercises as stated in  above notes.  This included exercises focusing on stretching, strengthening, with significant focus on eccentric aspects.   Long term goals include an improvement in range of motion, strength,  endurance as well as avoiding reinjury. Patient's frequency would include in 1-2 times a day, 3-5 times a week for a duration of 6-12 weeks. Low back exercises that included:  Pelvic tilt/bracing instruction to focus on control of the pelvic girdle and lower abdominal muscles  Glute strengthening exercises, focusing on proper firing of the glutes without engaging the low back muscles Proper stretching techniques for maximum relief for the hamstrings, hip flexors, low back and some rotation where tolerated  Proper technique shown and discussed handout in great detail with ATC.  All questions were discussed and answered.     Impression and Recommendations:     The above documentation has been reviewed and is accurate and complete Taylor Pulley, DO

## 2023-02-17 NOTE — Assessment & Plan Note (Signed)
Given refill for Furosemide prn

## 2023-02-17 NOTE — Progress Notes (Signed)
Subjective:    Patient ID: [name], female    DOB: 1946-11-05, 77 y.o.   MRN: [id number]  Chief Complaint  Patient presents with   Follow-up    Follow up    HPI Patient is in today for follow up on chronic medical concerns. She presented to the ED last week with TIA type symptoms in her face but CT and MRI were negative and she has had no recurrence of symptoms. She denies any recent febrile illness or acute hospitalizations. Denies CP/palp/SOB/HA/congestion/fevers/GI or GU c/o. Taking meds as prescribed   Past Medical History:  Diagnosis Date   Acute MI, subendocardial ([place])    Arthritis    Back pain 05/31/2013   Benign paroxysmal positional vertigo 08/05/2016   CAD (coronary artery disease)    a. NSTEMI 10/2011: [place] 11/19/11: pLAD 30%, oOM 60%, AVCFX 30%, CFX after OM2 30%, pRCA 60 and 70%, then 99%, AM 80-90% with TIMI 3 flow.  PCI: Promus DES x 2 to RCA; b. 06/2012 Cath: patent RCA stents w/ subtl occl of Acute Marginal (jailed)->Med rx; c. 05/2015 MV: EF 59%, mod mid infsept/inf/ap lat/ap infarct with peri-infarct isch-->Med Rx; d. 02/2016 Cath: LM nl, LAD 53m RI 50, RCA patent stents.   Colitis    Facial skin lesion 01/28/2017   GERD (gastroesophageal reflux disease)    occasional   Hyperlipidemia    Hypertension    Hypertensive heart disease    a. Echocardiogram 11/19/11: Difficult acoustic windows, EF 60-65%, normal LV wall thickness, grade 1 diastolic dysfunction   Hypoparathyroidism (HCC)    Low zinc level 11/26/2016   Multinodular thyroid    Goiter s/p thyroidectomy in 2007 with post-op  hypocalcemia and post-op hypothyroidism/hypoparathyroidism with hypocalcemia   Neck pain on right side 07/13/2013   Nocturia 05/31/2013   Osteoarthritis    Pain in right axilla 03/13/2016   Palpitations    a. 03/2012 - patient set up for event monitor but did not wear correctly -  she declined wearing a repeat monitor   Post-surgical hypothyroidism    Sun-damaged skin 12/05/2014    Tubular adenoma of colon 06/2011   Unspecified constipation 05/31/2013   Vertigo    Zinc deficiency 11/26/2015    Past Surgical History:  Procedure Laterality Date   CARDIAC CATHETERIZATION N/A 03/08/2016   Procedure: Left Heart Cath and Coronary Angiography;  Surgeon: JJettie Booze MD;  [facility];  Service: Cardiovascular;  Laterality: N/A;   COLONOSCOPY  Aenomatous polyps   07/05/2011   COLONOSCOPY N/A 09/28/2014   Procedure: COLONOSCOPY;  Surgeon: MLadene Artist MD;  [facility];  Service: Endoscopy;  Laterality: N/A;   CORONARY ANGIOPLASTY WITH STENT PLACEMENT     LEFT HEART CATH AND CORONARY ANGIOGRAPHY N/A 12/09/2018   Procedure: LEFT HEART CATH AND CORONARY ANGIOGRAPHY;  Surgeon: VJettie Booze MD;  [facility];  Service: Cardiovascular;  Laterality: N/A;   LEFT HEART CATHETERIZATION WITH CORONARY ANGIOGRAM N/A 07/17/2012   Procedure: LEFT HEART CATHETERIZATION WITH CORONARY ANGIOGRAM;  Surgeon: THillary Bow MD;  [facility];  Service: Cardiovascular;  Laterality: N/A;   PERCUTANEOUS CORONARY STENT INTERVENTION (PCI-S) N/A 11/19/2011   Procedure: PERCUTANEOUS CORONARY STENT INTERVENTION (PCI-S);  Surgeon: THillary Bow MD;  [facility];  Service: Cardiovascular;  Laterality: N/A;   POLYPECTOMY     SHOULDER ARTHROSCOPY W/ ROTATOR CUFF REPAIR     right   TOTAL THYROIDECTOMY  2007   GOITER  Family History  Problem Relation Age of Onset   Colon cancer Brother    Cancer Brother        COLON   Hypertension Father    Heart disease Father    Heart attack Father    Blindness Sister    Congestive Heart Failure Sister    Hypertension Sister    Healthy Daughter    Healthy Son    Thyroid disease Sister    Cancer Brother        multiple myelomas   Healthy Daughter    Healthy Daughter    Diabetes Neg Hx    Prostate cancer Neg Hx    Breast cancer Neg Hx    Esophageal cancer Neg Hx    Rectal  cancer Neg Hx    Stomach cancer Neg Hx     Social History   Socioeconomic History   Marital status: Married    Spouse name: Not on file   Number of children: 4   Years of education: 14   Highest education level: Not on file  Occupational History   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  Her son lives near her.   Right-handed.   1 cup coffee per day.   Social Determinants of Health   Financial Resource Strain: Low Risk  (05/07/2022)   Overall Financial Resource Strain (CARDIA)    Difficulty of Paying Living Expenses: Not hard at all  Food Insecurity: No Food Insecurity (02/06/2023)   Hunger Vital Sign    Worried About Running Out of Food in the Last Year: Never true    Ran Out of Food in the Last Year: Never true  Transportation Needs: No Transportation Needs (02/06/2023)   PRAPARE - Hydrologist (Medical): No    Lack of Transportation (Non-Medical): No  Physical Activity: Sufficiently Active (05/07/2022)   Exercise Vital Sign    Days of Exercise per Week: 7 days    Minutes of Exercise per Session: 30 min  Stress: No Stress Concern Present (05/07/2022)   [place]    Feeling of Stress : Not at all  Social Connections: Not on file  Intimate Partner Violence: Not At Risk (02/06/2023)   Humiliation, Afraid, Rape, and Kick questionnaire    Fear of Current or Ex-Partner: No    Emotionally Abused: No    Physically Abused: No    Sexually Abused: No    Outpatient Medications Prior to Visit  Medication Sig Dispense Refill   aspirin EC 81 MG tablet Take 81 mg by mouth daily.     B Complex-C  (B-COMPLEX WITH VITAMIN C) tablet Take 1 tablet by mouth daily.     bisoprolol (ZEBETA) 5 MG tablet Take 1 tablet (5 mg total) by mouth daily. 100 tablet 3   calcitRIOL (ROCALTROL) 0.25 MCG capsule TAKE 3 CAPSULES BY MOUTH DAILY 300 capsule 2   Cholecalciferol (VITAMIN D) 125 MCG (5000 UT) CAPS Take 5,000 Units by mouth daily in the afternoon. 100 capsule 3   ciclopirox (PENLAC) 8 % solution Apply topically at bedtime. Apply over nail and  surrounding skin. Apply daily over previous coat. After seven (7) days, may remove with alcohol and continue cycle. 6.6 mL 0   clopidogrel (PLAVIX) 75 MG tablet Take 1 tablet (75 mg total) by mouth daily. 90 tablet 3   diazepam (VALIUM) 5 MG tablet Take 1 tablet (5 mg total) by mouth daily as needed for anxiety (MRI). Take 1 one hour prior to MRI and another when arrive 10 tablet 0   isosorbide mononitrate (IMDUR) 60 MG 24 hr tablet Take 1 tablet (60 mg total) by mouth daily. 90 tablet 3   Multiple Vitamin (MULTIVITAMIN WITH MINERALS) TABS tablet Take 1 tablet by mouth daily.     nitroGLYCERIN (NITROSTAT) 0.4 MG SL tablet Place 1 tablet (0.4 mg total) under the tongue every 5 (five) minutes as needed for chest pain. 25 tablet 3   pantoprazole (PROTONIX) 40 MG tablet TAKE 1 TABLET BY MOUTH  DAILY 100 tablet 2   REPATHA SURECLICK [id number] MG/ML SOAJ INJECT '140MG'$  SUBCUTANEOUSLY EVERY 2 WEEKS 6 mL 3   rosuvastatin (CRESTOR) 40 MG tablet TAKE 1 TABLET BY MOUTH DAILY 90 tablet 3   urea (CARMOL) 10 % cream Apply topically as needed. 71 g 0   amLODipine (NORVASC) 10 MG tablet Take 1 tablet (10 mg total) by mouth daily. 30 tablet 2   furosemide (LASIX) 20 MG tablet 1 tab po daily prn severe pedal edema 10 tablet 0   levothyroxine (SYNTHROID) 125 MCG tablet Take 1 tablet (125 mcg total) by mouth daily before breakfast. 30 tablet 1   oxyCODONE (ROXICODONE) 5 MG immediate release tablet Take 1 tablet (5 mg total) by mouth every 6 (six) hours as needed for up to 20 doses for  breakthrough pain. 20 tablet 0   No facility-administered medications prior to visit.    Allergies  Allergen Reactions   Zetia [Ezetimibe] Other (See Comments)    Myalgia, paresthesias and weakness   Penicillin G Rash    Review of Systems  Constitutional:  Positive for malaise/fatigue. Negative for fever.  HENT:  Negative for congestion.   Eyes:  Negative for blurred vision.  Respiratory:  Negative for shortness of breath.   Cardiovascular:  Negative for chest pain, palpitations and leg swelling.  Gastrointestinal:  Negative for abdominal pain, blood in stool and nausea.  Genitourinary:  Negative for dysuria and frequency.  Musculoskeletal:  Negative for falls.  Skin:  Negative for rash.  Neurological:  Negative for dizziness, loss of consciousness and headaches.  Endo/Heme/Allergies:  Negative for environmental allergies.  Psychiatric/Behavioral:  Negative for depression. The patient is nervous/anxious.        Objective:    Physical Exam Constitutional:      General: She is not in acute distress.    Appearance: Normal appearance. She is well-developed. She is not toxic-appearing.  HENT:     Head: Normocephalic and atraumatic.     Right Ear: External ear normal.     Left Ear: External ear normal.     Nose: Nose normal.  Eyes:     General:        Right eye: No discharge.        Left eye: No discharge.     Conjunctiva/sclera: Conjunctivae normal.  Neck:     Thyroid: No thyromegaly.  Cardiovascular:     Rate and Rhythm: Normal rate and regular rhythm.     Heart sounds: Normal heart sounds. No murmur heard. Pulmonary:     Effort: Pulmonary effort is normal. No respiratory distress.  Breath sounds: Normal breath sounds.  Abdominal:     General: Bowel sounds are normal.     Palpations: Abdomen is soft.     Tenderness: There is no abdominal tenderness. There is no guarding.  Musculoskeletal:        General: Normal range of motion.     Cervical back: Neck supple.   Lymphadenopathy:     Cervical: No cervical adenopathy.  Skin:    General: Skin is warm and dry.  Neurological:     Mental Status: She is alert and oriented to person, place, and time.  Psychiatric:        Mood and Affect: Mood normal.        Behavior: Behavior normal.        Thought Content: Thought content normal.        Judgment: Judgment normal.     BP (!) 145/70 (BP Location: Right Arm, Patient Position: Sitting, Cuff Size: Normal)   Pulse 66   Temp 98 F (36.7 C) (Oral)   Resp 16   Ht '5\' 5"'$  (1.651 m)   Wt 190 lb (86.2 kg)   SpO2 96%   BMI 31.62 kg/m  Wt Readings from Last 3 Encounters:  02/11/23 190 lb (86.2 kg)  02/05/23 190 lb (86.2 kg)  01/14/23 189 lb 6.4 oz (85.9 kg)    Diabetic Foot Exam - Simple   No data filed    Lab Results  Component Value Date   WBC 10.5 02/06/2023   HGB 14.2 02/06/2023   HCT 42.0 02/06/2023   PLT 238 02/06/2023   GLUCOSE 99 02/06/2023   CHOL 200 02/05/2023   TRIG 178 (H) 02/05/2023   HDL 52 02/05/2023   LDLDIRECT 96.0 11/06/2022   LDLCALC 112 (H) 02/05/2023   ALT 13 02/06/2023   AST 16 02/06/2023   NA 139 02/06/2023   K 3.5 02/06/2023   CL 100 02/06/2023   CREATININE 1.12 (H) 02/06/2023   BUN 20 02/06/2023   CO2 27 02/06/2023   TSH 7.578 (H) 02/06/2023   INR 0.9 02/05/2023   HGBA1C 5.4 02/05/2023    Lab Results  Component Value Date   TSH 7.578 (H) 02/06/2023   Lab Results  Component Value Date   WBC 10.5 02/06/2023   HGB 14.2 02/06/2023   HCT 42.0 02/06/2023   MCV 86.4 02/06/2023   PLT 238 02/06/2023   Lab Results  Component Value Date   NA 139 02/06/2023   K 3.5 02/06/2023   CO2 27 02/06/2023   GLUCOSE 99 02/06/2023   BUN 20 02/06/2023   CREATININE 1.12 (H) 02/06/2023   BILITOT 0.4 02/06/2023   ALKPHOS 75 02/06/2023   AST 16 02/06/2023   ALT 13 02/06/2023   PROT 6.6 02/06/2023   ALBUMIN 3.4 (L) 02/06/2023   CALCIUM 8.4 (L) 02/06/2023   ANIONGAP 12 02/06/2023   EGFR 50 (L) 01/14/2023   GFR  50.74 (L) 11/06/2022   Lab Results  Component Value Date   CHOL 200 02/05/2023   Lab Results  Component Value Date   HDL 52 02/05/2023   Lab Results  Component Value Date   LDLCALC 112 (H) 02/05/2023   Lab Results  Component Value Date   TRIG 178 (H) 02/05/2023   Lab Results  Component Value Date   CHOLHDL 3.8 02/05/2023   Lab Results  Component Value Date   HGBA1C 5.4 02/05/2023       Assessment & Plan:  Acquired hypothyroidism Assessment & Plan: On Levothyroxine, continue  to monitor    Essential hypertension, benign Assessment & Plan: Well controlled, no changes to meds. Encouraged heart healthy diet such as the DASH diet and exercise as tolerated.     Mixed hyperlipidemia Assessment & Plan: Encourage heart healthy diet such as MIND or DASH diet, increase exercise, avoid trans fats, simple carbohydrates and processed foods, consider a krill or fish or flaxseed oil cap daily.  Tolerating Rosuvastatin   Zinc deficiency Assessment & Plan: Supplement and monitor    TIA (transient ischemic attack) Assessment & Plan: Presented to hospital last week with left sided facial numbness but imaging CT and MRI were negative, she is feeling well and has had no recurrence. No changes. Continue current meds, follow up with neurology   Edema, unspecified type Assessment & Plan: Given refill for Furosemide prn   Other orders -     Furosemide; 1 tab po daily prn severe pedal edema  Dispense: 30 tablet; Refill: 1    Penni Homans, MD

## 2023-02-18 ENCOUNTER — Ambulatory Visit (INDEPENDENT_AMBULATORY_CARE_PROVIDER_SITE_OTHER): Payer: 59 | Admitting: Family Medicine

## 2023-02-18 ENCOUNTER — Encounter: Payer: Self-pay | Admitting: Family Medicine

## 2023-02-18 VITALS — BP 132/62 | HR 54 | Ht 65.0 in | Wt 190.0 lb

## 2023-02-18 DIAGNOSIS — M51369 Other intervertebral disc degeneration, lumbar region without mention of lumbar back pain or lower extremity pain: Secondary | ICD-10-CM

## 2023-02-18 DIAGNOSIS — I83892 Varicose veins of left lower extremities with other complications: Secondary | ICD-10-CM | POA: Diagnosis not present

## 2023-02-18 DIAGNOSIS — M25571 Pain in right ankle and joints of right foot: Secondary | ICD-10-CM

## 2023-02-18 DIAGNOSIS — M5136 Other intervertebral disc degeneration, lumbar region: Secondary | ICD-10-CM

## 2023-02-18 MED ORDER — GABAPENTIN 100 MG PO CAPS: 200.0000 mg | ORAL_CAPSULE | Freq: Every day | ORAL | 0 refills | Status: DC

## 2023-02-18 NOTE — Patient Instructions (Addendum)
Exercises Gabapentin '200mg'$  at night Tart cherry extract '1200mg'$  at night We can look at your ankle See me in 6 weeks

## 2023-02-18 NOTE — Assessment & Plan Note (Signed)
Patient does have some over the right ankle.  Discussed with patient about this.  Discussed which activities to do and which ones to avoid, increase activity slowly.  Follow-up again and we can consider injection.

## 2023-02-18 NOTE — Assessment & Plan Note (Signed)
Significant degenerative disc disease of the lumbar spine with some findings that is suggestive of possible nerve root impingement.  Has had multiple epidurals for multiple years by another provider.  Do not think it is a terrible way of treating this but patient would like to avoid any type of long-term ramifications of steroids.  We did discuss the possibility of medial branch block but patient wants to hold on that for radiofrequency ablation.  Would be highly interested in trying the gabapentin.  Warned of potential side effects.  Hopefully the patient does respond well to this.  Discussed home exercises.  The patient has done formal physical therapy in the past.  Patient will follow-up with me again in 6 to 8 weeks and depending on findings we will discuss thereafter.

## 2023-02-20 ENCOUNTER — Ambulatory Visit: Payer: 59

## 2023-02-20 DIAGNOSIS — R293 Abnormal posture: Secondary | ICD-10-CM

## 2023-02-20 DIAGNOSIS — R252 Cramp and spasm: Secondary | ICD-10-CM | POA: Diagnosis not present

## 2023-02-20 DIAGNOSIS — M542 Cervicalgia: Secondary | ICD-10-CM

## 2023-02-20 DIAGNOSIS — G8929 Other chronic pain: Secondary | ICD-10-CM | POA: Diagnosis not present

## 2023-02-20 DIAGNOSIS — M5441 Lumbago with sciatica, right side: Secondary | ICD-10-CM | POA: Diagnosis not present

## 2023-02-20 NOTE — Therapy (Signed)
OUTPATIENT PHYSICAL THERAPY TREATMENT   Patient Name: [name] MRN: [id number] DOB:1946/11/29, 77 y.o., female Today's Date: 02/20/2023   PT End of Session - 02/20/23 1532     Visit Number 20    Number of Visits 25    Date for PT Re-Evaluation 02/28/23    Authorization Type [employer] Medicare    Progress Note Due on Visit 25    PT Start Time 1442    PT Stop Time 1518   pt requested to end early   PT Time Calculation (min) 36 min    Activity Tolerance Patient tolerated treatment well    Behavior During Therapy [facility] for tasks assessed/performed                        Past Medical History:  Diagnosis Date   Acute MI, subendocardial ([place])    Arthritis    Back pain 05/31/2013   Benign paroxysmal positional vertigo 08/05/2016   CAD (coronary artery disease)    a. NSTEMI 10/2011: [place] 11/19/11: pLAD 30%, oOM 60%, AVCFX 30%, CFX after OM2 30%, pRCA 60 and 70%, then 99%, AM 80-90% with TIMI 3 flow.  PCI: Promus DES x 2 to RCA; b. 06/2012 Cath: patent RCA stents w/ subtl occl of Acute Marginal (jailed)->Med rx; c. 05/2015 MV: EF 59%, mod mid infsept/inf/ap lat/ap infarct with peri-infarct isch-->Med Rx; d. 02/2016 Cath: LM nl, LAD 76m RI 50, RCA patent stents.   Colitis    Facial skin lesion 01/28/2017   GERD (gastroesophageal reflux disease)    occasional   Hyperlipidemia    Hypertension    Hypertensive heart disease    a. Echocardiogram 11/19/11: Difficult acoustic windows, EF 60-65%, normal LV wall thickness, grade 1 diastolic dysfunction   Hypoparathyroidism (HCC)    Low zinc level 11/26/2016   Multinodular thyroid    Goiter s/p thyroidectomy in 2007 with post-op  hypocalcemia and post-op hypothyroidism/hypoparathyroidism with hypocalcemia   Neck pain on right side 07/13/2013   Nocturia 05/31/2013   Osteoarthritis    Pain in right axilla 03/13/2016   Palpitations    a. 03/2012 - patient set up for event monitor but did not wear correctly -  she declined wearing a repeat  monitor   Post-surgical hypothyroidism    Sun-damaged skin 12/05/2014   Tubular adenoma of colon 06/2011   Unspecified constipation 05/31/2013   Vertigo    Zinc deficiency 11/26/2015   Past Surgical History:  Procedure Laterality Date   CARDIAC CATHETERIZATION N/A 03/08/2016   Procedure: Left Heart Cath and Coronary Angiography;  Surgeon: JJettie Booze MD;  [facility];  Service: Cardiovascular;  Laterality: N/A;   COLONOSCOPY  Aenomatous polyps   07/05/2011   COLONOSCOPY N/A 09/28/2014   Procedure: COLONOSCOPY;  Surgeon: MLadene Artist MD;  Location: [facility];  Service: Endoscopy;  Laterality: N/A;   CORONARY ANGIOPLASTY WITH STENT PLACEMENT     LEFT HEART CATH AND CORONARY ANGIOGRAPHY N/A 12/09/2018   Procedure: LEFT HEART CATH AND CORONARY ANGIOGRAPHY;  Surgeon: VJettie Booze MD;  [facility];  Service: Cardiovascular;  Laterality: N/A;   LEFT HEART CATHETERIZATION WITH CORONARY ANGIOGRAM N/A 07/17/2012   Procedure: LEFT HEART CATHETERIZATION WITH CORONARY ANGIOGRAM;  Surgeon: THillary Bow MD;  [facility];  Service: Cardiovascular;  Laterality: N/A;   PERCUTANEOUS CORONARY STENT INTERVENTION (PCI-S) N/A 11/19/2011   Procedure: PERCUTANEOUS CORONARY STENT INTERVENTION (PCI-S);  Surgeon: THillary Bow MD;  Location: [facility];  Service: Cardiovascular;  Laterality: N/A;   POLYPECTOMY     SHOULDER ARTHROSCOPY W/ ROTATOR CUFF REPAIR     right   TOTAL THYROIDECTOMY  2007   GOITER   Patient Active Problem List   Diagnosis Date Noted   Sinus tarsi syndrome of right ankle 02/18/2023   Edema 02/17/2023   Acute prerenal azotemia 02/06/2023   TIA (transient ischemic attack) 02/05/2023   Paresthesia 08/28/2022   Chronic neck pain 08/28/2022   Neuropathy 08/21/2022   Vitamin deficiency 10/23/2021   Hypoglycemia 10/07/2019   Heart disease 01/29/2019   Sensorineural hearing loss (SNHL) of both ears 01/27/2019    Primary osteoarthritis of both knees 12/19/2018   DDD (degenerative disc disease), lumbar 12/19/2018   ANA positive 10/27/2018   Anxiety 02/17/2018   Peripheral arterial disease ([place]) 02/20/2017   Skin lesion of face 01/28/2017   Benign paroxysmal positional vertigo 08/05/2016   Antiplatelet or antithrombotic long-term use 03/27/2016   Right lumbar radiculopathy 02/21/2016   Abnormality of gait 02/21/2016   Zinc deficiency 11/26/2015   Right hip pain 10/26/2015   Medicare annual wellness visit, subsequent 03/13/2015   Preventative health care 03/13/2015   Sun-damaged skin 12/05/2014   Angina of effort 12/02/2014   Hx of colonic polyps 09/28/2014   Benign neoplasm of descending colon 09/28/2014   Other fatigue 09/10/2014   Personal history of colonic polyps 07/16/2014   Bilateral thumb pain 03/20/2014   Postsurgical hypoparathyroidism ([place]) 08/03/2013   Neck pain on right side 07/13/2013   Constipation 05/31/2013   Back pain 05/31/2013   Nocturia 05/31/2013   GERD (gastroesophageal reflux disease) 11/02/2012   Tension headache 11/02/2012   Anemia 06/11/2012   Hypokalemia 06/11/2012   Depression 01/21/2012   Headache(784.0) 12/23/2011   Sinus bradycardia 12/07/2011   Hypocalcemia 12/07/2011   Acute MI, subendocardial ([place]) 11/20/2011   Acquired hypothyroidism 05/20/2009   Essential hypertension, benign 05/20/2009   Hyperlipidemia 03/08/2009   CAD, NATIVE VESSEL 03/08/2009    PCP: Mosie Lukes, MD  REFERRING PROVIDER: Glenford Peers, NP  REFERRING DIAG: [phone] (ICD-10-CM) - Other spondylosis with radiculopathy, cervical region  THERAPY DIAG:  Cervicalgia  Chronic right-sided low back pain with right-sided sciatica  Abnormal posture  Cramp and spasm  Rationale for Evaluation and Treatment: Rehabilitation  ONSET DATE: chronic for years  SUBJECTIVE:  SUBJECTIVE STATEMENT: Pt reports going to DO on 2/26 has questions about some of the procedures mentioned.  PERTINENT HISTORY:  Surgical hypoparathyrodism, angina (has nitro), Hypothyroidism, hyperlipidemia, HTN, neuropathy hands and feet, R RTC repair  PAIN:  Are you having pain? Yes: NPRS scale: 6/10 Pain location: R shoulder Pain description: sometimes sharp shooting, sometimes arms feel weak, constant pain Aggravating factors: moving head overhead, reaching behind her back, turning neck (gets dizzy),  Relieving factors: holding arms close, avoiding movement.   Are you having pain? Yes: NPRS scale: 8/10 Pain location: low back, down R side to mid calf, sometimes numbness in feet with prolonged standing Pain description: pain and numbness Aggravating factors: bending over Relieving factors: laying down, icy/hot and patches, hot showers  PRECAUTIONS: None  WEIGHT BEARING RESTRICTIONS: No  FALLS:  Has patient fallen in last 6 months? No  LIVING ENVIRONMENT: Lives with: lives alone Lives in: House/apartment Stairs: No Has following equipment at home: None  OCCUPATION: retired  PLOF: Independent and Leisure: was going to [employer] but stopped due to pain  PATIENT GOALS: I want to be pain free without medications, know what machines are safe to use  NEXT MD VISIT: 02/02/23 with PCP  OBJECTIVE:   DIAGNOSTIC FINDINGS:  MRI done but not available to review  PATIENT SURVEYS:  NDI 23/50 = 46%  moderate disability   COGNITION: Overall cognitive status: Within functional limits for tasks assessed  SENSATION: Light touch: WFL  POSTURE: rounded shoulders and forward head  PALPATION: Tenderness throughout bil upper traps, levator scapulae, and cervical paraspinals, wincing and jumping away from firm touch.    CERVICAL ROM:   Active ROM AROM (deg) eval AROM 12/19/22 AROM 01/23/23 AROM  01/31/2023 AROM 02/10/23  Flexion 15 22 34 20   Extension '20 25 24 20   '$ Right lateral flexion 10 29     Left lateral flexion 15 32     Right rotation 40 50 45 42 59  Left rotation 40 50 57 35 45   (Blank rows = not tested)  UPPER EXTREMITY MMT:   MMT Right eval Left eval  Shoulder flexion 4+ 4+  Shoulder extension    Shoulder abduction 4+* 4  Shoulder adduction    Shoulder internal rotation 5 5  Shoulder external rotation 5 5  Elbow flexion 5 5  Elbow extension 5 5  Wrist flexion 5 5  Wrist extension 5 5   (Blank rows = not tested)  UPPER EXTREMITY ROM:  MMT Right eval Left eval  Shoulder flexion 115* 120  Shoulder extension 40 65  Shoulder abduction 100 135  Shoulder adduction    Shoulder internal rotation    Shoulder external rotation     (Blank rows = not tested)  LOWER EXTREMITY MMT:    MMT Right eval Left eval  Hip flexion 4 4  Hip extension    Hip abduction 5 5  Hip adduction 4+ 4+  Hip internal rotation    Hip external rotation    Knee flexion    Knee extension 5 5  Ankle dorsiflexion 5 5  Ankle plantarflexion     (Blank rows = not tested)   LUMBAR ROM:   Active  AROM  11/09/2022 AROM 01/01/23  Flexion To mid-thigh Mid shin  Extension WNL, no pain, but increased pain after repeated 5x Limited 50% - pain  Right lateral flexion Inc pain R side back, to knee To knee - increased pain  Left lateral flexion To mid thigh, increased pain  R side back To knee - increased pain  Right rotation WNL WNL  Left rotation WNL WNL   (Blank rows = not tested)   CERVICAL SPECIAL TESTS:  Spurling's test: Positive right  FUNCTIONAL TESTS:  5 times sit to stand: 35 seconds Increased sciatic pain with R leg extension in sitting.   5x STS: 31 seconds (01/01/23) TODAY'S TREATMENT:                                                                                                                              DATE: 02/20/23  Therapeutic Exercise: to improve strength and  mobility.  Demo, verbal and tactile cues throughout for technique. UBE forward x 6 min Seated shld squeeze x 10 Seated shoulder rolls x 10 SNAG cervical ext x 10  Low pec stretch in doorway 2x30 sec  Assessed cervical ROM (LTG #3)  02/12/23  Therapeutic Exercise: to improve strength and mobility.  Demo, verbal and tactile cues throughout for technique. UBE forward x 4 min, back x 1 min - increased R shoulder pain Review of HEP Manual Therapy: to decrease muscle spasm and pain and improve mobility STM/TPR to cervical paraspinals, PA mobs to cervical spine, SNAGs into rotation, gentle traction holds.  -reported increased vertigo with L head turns.     02/07/2023 Therapeutic Exercise: to improve strength and mobility.  Demo, verbal and tactile cues throughout for technique. UBE forward x 4 min Manual Therapy: to decrease muscle spasm and pain and improve mobility STM/TPR to R UT, R cervical paraspinals, R biceps, skilled palpation and monitoring during dry needling. Trigger Point Dry-Needling  Treatment instructions: Expect mild to moderate muscle soreness. S/S of pneumothorax if dry needled over a lung field, and to seek immediate medical attention should they occur. Patient verbalized understanding of these instructions and education. Patient Consent Given: Yes Education handout provided: Yes Muscles treated: R UT, R biceps Electrical stimulation performed: No Parameters: N/A Treatment response/outcome: Twitch Response Elicited and Palpable Increase in Muscle Length  02/04/2023  Therapeutic Exercise:  UBE forward x 3 min level 1 Manual Therapy: to decrease muscle spasm and pain and improve mobility STM/TPR to bil UT, cervical paraspinals, R biceps, skilled palpation and monitoring during dry needling. Trigger Point Dry-Needling  Treatment instructions: Expect mild to moderate muscle soreness. S/S of pneumothorax if dry needled over a lung field, and to seek immediate medical attention  should they occur. Patient verbalized understanding of these instructions and education. Patient Consent Given: Yes Education handout provided: Yes Muscles treated: R UT Electrical stimulation performed: No Parameters: N/A Treatment response/outcome: Twitch Response Elicited and Palpable Increase in Muscle Length Ultrasound: x 8 min to R biceps 1 MHz, 1.2 w/cm2 cont to decrease inflammation/pain  01/31/23 Therapeutic Exercise: to improve strength and mobility.  Demo, verbal and tactile cues throughout for technique. Nustep L4 x 2 min  Seated shoulder isometrics - 5 x 5 sec hold, submax contraction - flexion, extension, external rotation, internal  rotation, scap squeeze.  Manual Therapy: to decrease muscle spasm and pain and improve mobility STM/TPR to R levator scapulae, cervical paraspinals, UT, deltoids, IASTM to R deltoid, biceps.  Modalities: MHP to bil UT x 10 min concurrent with manual therapy to upper arm  Therapeutic Activity:  assessment of progress towards goals.  Cervical ROM, modified [place], NDI  PATIENT EDUCATION:  Education details: HEP update 11/09/22, 01/31/23 Person educated: Patient Education method: Explanation, Demonstration, Verbal cues, and Handouts Education comprehension: verbalized understanding and returned demonstration  HOME EXERCISE PROGRAM: Access Code: [email or web address] URL: [email or web address] Date: 01/31/2023 Prepared by: Glenetta Hew  Exercises - Seated Cervical Retraction  - 3 x daily - 7 x weekly - 1 sets - 10 reps - Seated Scapular Retraction  - 3 x daily - 7 x weekly - 1 sets - 10 reps - Seated Shoulder Rolls  - 3 x daily - 7 x weekly - 1 sets - 10 reps - Cervical Extension AROM with Strap  - 1 x daily - 7 x weekly - 1 sets - 10 reps - Seated Assisted Cervical Rotation with Towel  - 1 x daily - 7 x weekly - 1 sets - 10 reps - Standing Thoracic Open Book at [place]  - 1 x daily - 7 x weekly - 1 sets - 10 reps - Supine Bridge with  Resistance Band  - 1 x daily - 7 x weekly - 2 sets - 10 reps - Clamshell with Resistance  - 1 x daily - 7 x weekly - 2 sets - 10 reps - Supine 90/90 Alternating Toe Touch  - 1 x daily - 7 x weekly - 2 sets - 10 reps - Supine Active Straight Leg Raise  - 1 x daily - 7 x weekly - 2 sets - 10 reps - Forward T with Counter Support  - 1 x daily - 7 x weekly - 2 sets - 10 reps - Seated Quadratus Lumborum Stretch in Chair  - 1 x daily - 7 x weekly - 3 sets - 15 sec hold - Seated Scapular Retraction  - 3 x daily - 7 x weekly - 1 sets - 5 reps - 5 sec  hold - Seated Isometric Shoulder Internal Rotation with Towel  - 3 x daily - 7 x weekly - 1 sets - 5 reps - 5 sec hold - Seated Isometric Shoulder External Rotation  - 3 x daily - 7 x weekly - 1 sets - 5 reps - 5 sec hold - Isometric Shoulder Flexion at Wall  - 3 x daily - 7 x weekly - 1 sets - 5 reps - 5 sec hold - Isometric Shoulder Extension at Wall  - 3 x daily - 7 x weekly - 1 sets - 5 reps - 5 sec hold  ASSESSMENT:  CLINICAL IMPRESSION: Pt arrived with questions/concerns about last appointment with DO. She asked about an injection and possibly nerve ablation which I explained the procedures to her. Continued with light scapulothoracic mobility and cervical ROM. Also assessed cervical ROM which has improved but with more limitation going to L side. Will need re-evaluation next visit and plan for return to PT to emphasize the neck and possibly be evaluated for vertigo. Pt continues to demonstrate potential for improvement and would benefit from continued skilled therapy to address impairments.     OBJECTIVE IMPAIRMENTS: decreased activity tolerance, decreased mobility, decreased ROM, decreased strength, dizziness, hypomobility, increased fascial restrictions, impaired perceived functional ability, increased muscle spasms, impaired flexibility,  postural dysfunction, and pain.   ACTIVITY LIMITATIONS: carrying, lifting, bending, standing, sleeping, stairs,  transfers, dressing, reach over head, and hygiene/grooming  PARTICIPATION LIMITATIONS: meal prep, cleaning, laundry, driving, shopping, and community activity  PERSONAL FACTORS: Age, Time since onset of injury/illness/exacerbation, and 3+ comorbidities: angina, hypothyroidism, neck surgery, neuropathy, R RTC repair, osteoporosis, CAD, history MI, BPPV, chronic low back pain  are also affecting patient's functional outcome.   REHAB POTENTIAL: Good  CLINICAL DECISION MAKING: Evolving/moderate complexity  EVALUATION COMPLEXITY: Moderate   GOALS: Goals reviewed with patient? Yes  SHORT TERM GOALS: Target date: 11/20/2022   Patient will be independent with initial HEP.  Baseline: given Goal status: MET - 11/21/22   LONG TERM GOALS: Target date: 02/28/2023   Patient will be independent with advanced/ongoing HEP to improve outcomes and carryover.  Baseline:  Goal status: IN PROGRESS 01/31/23- good compliance, advanced today  2.  Patient will report 75% improvement in neck pain to improve QOL.  Baseline: severe Goal status: IN PROGRESS- 12/19/22 65% improvement 01/31/2023- 70% improvement.   3.  Patient will demonstrate improved cervical ROM by 10 deg all directions for safety with driving.  Baseline: see objective Goal status: IN PROGRESS-01/31/23- decreased due to shoulder pain  4.  Patient will report 15% improvement on NDI to demonstrate improved functional ability.  Baseline: 46% Goal status: IN PROGRESS 01/31/23- 34%  5.  Patient will report 75% improvement in low back pain to improve quality of life. Baseline:   Goal status: MET- 11/27/22  60-70% ; 01/31/23- 75%  6. Patient will report 12% improvement on modified Oswestry to demonstrate improved functional ability. Baseline: 12/19/22 -30% Goal status: MET   01/31/23- 18% impairment.   7. Patient will will be able to return to community-based exercise program to maintain progress. Baseline: stopped exercising due to pain Goal  status: IN PROGRESS    PLAN:  PT FREQUENCY: 1-2x/week  PT DURATION: 4 weeks  PLANNED INTERVENTIONS: Therapeutic exercises, Therapeutic activity, Neuromuscular re-education, Balance training, Gait training, Patient/Family education, Self Care, Joint mobilization, Stair training, Dry Needling, Electrical stimulation, Spinal mobilization, Cryotherapy, Moist heat, Taping, Traction, Ultrasound, Manual therapy, and Re-evaluation  PLAN FOR NEXT SESSION: re-evaluation continue to progress postural exercises as tolerated, manual therapy, modalities PRN.    Artist Pais, PTA 02/20/2023, 3:43 PM

## 2023-02-26 ENCOUNTER — Other Ambulatory Visit: Payer: Self-pay

## 2023-02-26 ENCOUNTER — Ambulatory Visit: Payer: 59 | Attending: Family Medicine

## 2023-02-26 DIAGNOSIS — M542 Cervicalgia: Secondary | ICD-10-CM | POA: Diagnosis not present

## 2023-02-26 DIAGNOSIS — H8110 Benign paroxysmal vertigo, unspecified ear: Secondary | ICD-10-CM | POA: Diagnosis not present

## 2023-02-26 DIAGNOSIS — R293 Abnormal posture: Secondary | ICD-10-CM | POA: Diagnosis not present

## 2023-02-26 DIAGNOSIS — R42 Dizziness and giddiness: Secondary | ICD-10-CM

## 2023-02-26 DIAGNOSIS — H8113 Benign paroxysmal vertigo, bilateral: Secondary | ICD-10-CM

## 2023-02-26 NOTE — Therapy (Signed)
OUTPATIENT PHYSICAL THERAPY VESTIBULAR EVALUATION     Patient Name: [name] MRN: [id number] DOB:October 06, 1946, 77 y.o., female Today's Date: 02/26/2023  END OF SESSION:  PT End of Session - 02/26/23 1455     Visit Number 21    Number of Visits 25    Date for PT Re-Evaluation 04/09/23    Authorization Type [employer] Medicare    PT Start Time 1408    PT Stop Time 1446    PT Time Calculation (min) 38 min    Activity Tolerance Patient tolerated treatment well    Behavior During Therapy WFL for tasks assessed/performed             Past Medical History:  Diagnosis Date   Acute MI, subendocardial ([place])    Arthritis    Back pain 05/31/2013   Benign paroxysmal positional vertigo 08/05/2016   CAD (coronary artery disease)    a. NSTEMI 10/2011: [facility] 11/19/11: pLAD 30%, oOM 60%, AVCFX 30%, CFX after OM2 30%, pRCA 60 and 70%, then 99%, AM 80-90% with TIMI 3 flow.  PCI: Promus DES x 2 to RCA; b. 06/2012 Cath: patent RCA stents w/ subtl occl of Acute Marginal (jailed)->Med rx; c. 05/2015 MV: EF 59%, mod mid infsept/inf/ap lat/ap infarct with peri-infarct isch-->Med Rx; d. 02/2016 Cath: LM nl, LAD 43m RI 50, RCA patent stents.   Colitis    Facial skin lesion 01/28/2017   GERD (gastroesophageal reflux disease)    occasional   Hyperlipidemia    Hypertension    Hypertensive heart disease    a. Echocardiogram 11/19/11: Difficult acoustic windows, EF 60-65%, normal LV wall thickness, grade 1 diastolic dysfunction   Hypoparathyroidism (HCC)    Low zinc level 11/26/2016   Multinodular thyroid    Goiter s/p thyroidectomy in 2007 with post-op  hypocalcemia and post-op hypothyroidism/hypoparathyroidism with hypocalcemia   Neck pain on right side 07/13/2013   Nocturia 05/31/2013   Osteoarthritis    Pain in right axilla 03/13/2016   Palpitations    a. 03/2012 - patient set up for event monitor but did not wear correctly -  she declined wearing a repeat monitor   Post-surgical hypothyroidism     Sun-damaged skin 12/05/2014   Tubular adenoma of colon 06/2011   Unspecified constipation 05/31/2013   Vertigo    Zinc deficiency 11/26/2015   Past Surgical History:  Procedure Laterality Date   CARDIAC CATHETERIZATION N/A 03/08/2016   Procedure: Left Heart Cath and Coronary Angiography;  Surgeon: JJettie Booze MD;  [facility];  Service: Cardiovascular;  Laterality: N/A;   COLONOSCOPY  Aenomatous polyps   07/05/2011   COLONOSCOPY N/A 09/28/2014   Procedure: COLONOSCOPY;  Surgeon: MLadene Artist MD;  Location: [facility];  Service: Endoscopy;  Laterality: N/A;   CORONARY ANGIOPLASTY WITH STENT PLACEMENT     LEFT HEART CATH AND CORONARY ANGIOGRAPHY N/A 12/09/2018   Procedure: LEFT HEART CATH AND CORONARY ANGIOGRAPHY;  Surgeon: VJettie Booze MD;  [facility];  Service: Cardiovascular;  Laterality: N/A;   LEFT HEART CATHETERIZATION WITH CORONARY ANGIOGRAM N/A 07/17/2012   Procedure: LEFT HEART CATHETERIZATION WITH CORONARY ANGIOGRAM;  Surgeon: THillary Bow MD;  [facility];  Service: Cardiovascular;  Laterality: N/A;   PERCUTANEOUS CORONARY STENT INTERVENTION (PCI-S) N/A 11/19/2011   Procedure: PERCUTANEOUS CORONARY STENT INTERVENTION (PCI-S);  Surgeon: THillary Bow MD;  [facility];  Service: Cardiovascular;  Laterality: N/A;   POLYPECTOMY     SHOULDER ARTHROSCOPY W/  ROTATOR CUFF REPAIR     right   TOTAL THYROIDECTOMY  2007   GOITER   Patient Active Problem List   Diagnosis Date Noted   Sinus tarsi syndrome of right ankle 02/18/2023   Edema 02/17/2023   Acute prerenal azotemia 02/06/2023   TIA (transient ischemic attack) 02/05/2023   Paresthesia 08/28/2022   Chronic neck pain 08/28/2022   Neuropathy 08/21/2022   Vitamin deficiency 10/23/2021   Hypoglycemia 10/07/2019   Heart disease 01/29/2019   Sensorineural hearing loss (SNHL) of both ears 01/27/2019   Primary osteoarthritis of both knees 12/19/2018    DDD (degenerative disc disease), lumbar 12/19/2018   ANA positive 10/27/2018   Anxiety 02/17/2018   Peripheral arterial disease ([place]) 02/20/2017   Skin lesion of face 01/28/2017   Benign paroxysmal positional vertigo 08/05/2016   Antiplatelet or antithrombotic long-term use 03/27/2016   Right lumbar radiculopathy 02/21/2016   Abnormality of gait 02/21/2016   Zinc deficiency 11/26/2015   Right hip pain 10/26/2015   Medicare annual wellness visit, subsequent 03/13/2015   Preventative health care 03/13/2015   Sun-damaged skin 12/05/2014   Angina of effort 12/02/2014   Hx of colonic polyps 09/28/2014   Benign neoplasm of descending colon 09/28/2014   Other fatigue 09/10/2014   Personal history of colonic polyps 07/16/2014   Bilateral thumb pain 03/20/2014   Postsurgical hypoparathyroidism ([place]) 08/03/2013   Neck pain on right side 07/13/2013   Constipation 05/31/2013   Back pain 05/31/2013   Nocturia 05/31/2013   GERD (gastroesophageal reflux disease) 11/02/2012   Tension headache 11/02/2012   Anemia 06/11/2012   Hypokalemia 06/11/2012   Depression 01/21/2012   Headache(784.0) 12/23/2011   Sinus bradycardia 12/07/2011   Hypocalcemia 12/07/2011   Acute MI, subendocardial ([place]) 11/20/2011   Acquired hypothyroidism 05/20/2009   Essential hypertension, benign 05/20/2009   Hyperlipidemia 03/08/2009   CAD, NATIVE VESSEL 03/08/2009    PCP: Penni Homans, MD REFERRING PROVIDER: Penni Homans, MD  REFERRING DIAG: BPPV  THERAPY DIAG:  Cervicalgia  Abnormal posture  BPPV (benign paroxysmal positional vertigo), bilateral  Dizziness and giddiness  ONSET DATE: 2014  Rationale for Evaluation and Treatment: Rehabilitation  SUBJECTIVE:   SUBJECTIVE STATEMENT: I have been dizzy/ nauseous for 10 years.  Have stopped moving my head so much, I just close my eyes and sit still on my sofa , I get really nauseous Pt accompanied by: friend  PERTINENT HISTORY: h/o chronic vertigo,  referred to PT by PCP to assess for BPPV  PAIN:  Are you having pain? Yes: NPRS scale: 4/10 Pain location: lower back Pain description: chronic Aggravating factors: bed mobility Relieving factors: close eyes, keep head still  PRECAUTIONS: None  WEIGHT BEARING RESTRICTIONS: No  FALLS: Has patient fallen in last 6 months? No  LIVING ENVIRONMENT: Lives with: lives alone Lives in: House/apartment Stairs: No Has following equipment at home: None  PLOF: Independent  PATIENT GOALS: get rid of dizziness, nausea  OBJECTIVE:   DIAGNOSTIC FINDINGS: MRI at hospital recently brain and CT due to TIA/ stroke Sx , and both with negative results  COGNITION: Overall cognitive status: Within functional limits for tasks assessed   SENSATION: WFL  EDEMA:  None noted   POSTURE:  rounded shoulders, forward head, and increased thoracic kyphosis  Cervical ROM:    Active A/PROM (deg) eval  Flexion 70%  Extension 50%  Right lateral flexion   Left lateral flexion   Right rotation 50%  Left rotation 40%  (Blank rows = not tested)  STRENGTH: L  thumb extension 3+/5, all other MMT elbow and distally wnl, shoulders not assessed, currently under care for R shoulder pain/ injury  LOWER EXTREMITY MMT: grossly wnl  BED MOBILITY:  Sit to supine Modified independence Supine to sit Modified independence Rolling to Right Modified independence Rolling to Left Modified independence  TRANSFERS:all wnl RAMP:  na  CURB:  na  GAIT: Gait pattern: WFL Distance walked: in clinic over 50' Assistive device utilized: None Level of assistance: Complete Independence  FUNCTIONAL TESTS:    PATIENT SURVEYS:  [place] 47/80 or 42% deficit  VESTIBULAR ASSESSMENT:  GENERAL OBSERVATION: guarded movements, frequently closes eyes , slow movements, avoids neck rotation with movement transitions and resting posture   SYMPTOM BEHAVIOR:  Subjective history: 10 yr h.o vertigo, no previous  Rx  Non-Vestibular symptoms: neck pain, nausea/vomiting, and numbness l side of mouth  Type of dizziness: Spinning/Vertigo and Lightheadedness/Faint  Frequency: daily  Duration: a few secs to several mins  Aggravating factors: Induced by position change: rolling to the right, rolling to the left, and supine to sit, Induced by motion: bending down to the ground, turning body quickly, and turning head quickly, Worse in the dark, and Moving eyes  Relieving factors: head stationary and closing eyes  Progression of symptoms: worse  OCULOMOTOR EXAM:  Ocular Alignment: normal  Ocular ROM: No Limitations  Spontaneous Nystagmus: right beating  Gaze-Induced Nystagmus: right beating with right gaze  Smooth Pursuits: saccades  Saccades: hypermetric/overshoots and extra eye movements  Convergence/Divergence:na today    Gaze-Induced Nystagmus: absent   Positional tests: Right Dix-Hallpike: no nystagmus Left Dix-Hallpike: no nystagmus Right Roll Test: symptoms worse on left side Left Roll Test: symptoms worse on left side  VESTIBULAR - OCULAR REFLEX: nt today  POSITIONAL TESTING: Right Dix-Hallpike: no nystagmus Left Dix-Hallpike: no nystagmus Right Roll Test: no nystagmus Left Roll Test: no nystagmus  MOTION SENSITIVITY:  Motion Sensitivity Quotient Intensity: 0 = none, 1 = Lightheaded, 2 = Mild, 3 = Moderate, 4 = Severe, 5 = Vomiting  Intensity  1. Sitting to supine   2. Supine to L side   3. Supine to R side   4. Supine to sitting   5. L Hallpike-Dix   6. Up from L    7. R Hallpike-Dix   8. Up from R    9. Sitting, head tipped to L knee   10. Head up from L knee   11. Sitting, head tipped to R knee   12. Head up from R knee   13. Sitting head turns x5   14.Sitting head nods x5   15. In stance, 180 turn to L    16. In stance, 180 turn to R     OTHOSTATICS: not done  FUNCTIONAL GAIT:  na today   VESTIBULAR TREATMENT:                                                                                                    DATE: 02/26/23  Canalith Repositioning:  Epley Right: Number of Reps: 1 and Response to Treatment: symptoms improved   PATIENT EDUCATION: Education  details: POC, expectations Person educated: Patient Education method: Explanation, Demonstration, and Verbal cues Education comprehension: verbalized understanding  HOME EXERCISE PROGRAM:  GOALS: Goals reviewed with patient? Yes  SHORT TERM GOALS: Target date: 03/12/23  Patient will report compliance with initial HEP.  Baseline:not initiated yet Goal status: INITIAL  2.  Patient will report resolution of dizziness rolling over in bed.  Baseline: consistent dizziness Goal status: INITIAL  3.  Patient will complete further balance testing.  Baseline:  Goal status: INITIAL LONG TERM GOALS: Target date: 04/09/23   1.Patient will be independent with progressed HEP to improve outcomes and carryover.  Baseline: not initiated yet Goal status: INITIAL     2. Patient will demonstrate 26/30 score on FGA to decrease risk of falls.  Baseline: na yet Goal status: INITIAL   Patient will report 56 or greater on DHI to demonstrate improved QOL. Baseline: 47 Goal status: INITIAL  ASSESSMENT:  CLINICAL IMPRESSION: Patient is a 77 y.o. female who was seen today for physical therapy evaluation and treatment for chronic vertigo.  Today assessed for BPPV and patient with provocation of her symptoms with horizontal canal testing to L, and B [employer] tests. Determined to treat for R posterior canalithiasis today with Epley which elicited good response with no Sx with repeat testing of R post canal.  Advised her that she may not get complete resolution of Sx after today's session due to chronicity of her complaint.  Also she has restricted cervical spine movement and stiffness.  She has been performing some habituation ex at home, may improve with these once peripheral Sx resolved. Would recommend skilled PT  to address the above deficits.   OBJECTIVE IMPAIRMENTS: decreased activity tolerance, decreased ROM, dizziness, and postural dysfunction.   ACTIVITY LIMITATIONS: lifting, bending, sleeping, bed mobility, and reach over head  PARTICIPATION LIMITATIONS: cleaning, laundry, and community activity  PERSONAL FACTORS: Time since onset of injury/illness/exacerbation and 1-2 comorbidities: arthritis multiple sites,HTN  are also affecting patient's functional outcome.   REHAB POTENTIAL: Good  CLINICAL DECISION MAKING: Stable/uncomplicated  EVALUATION COMPLEXITY: Low   PLAN:  PT FREQUENCY: 1-2x/week  PT DURATION: 6 weeks  PLANNED INTERVENTIONS: Therapeutic exercises, Therapeutic activity, Neuromuscular re-education, Balance training, Gait training, Patient/Family education, Self Care, Joint mobilization, and Canalith repositioning  PLAN FOR NEXT SESSION: reassess BPPV, perform VOR tests , further assess cervical spine   Indiya Izquierdo L Aylee Littrell, PT 02/26/2023, 4:44 PM

## 2023-02-27 ENCOUNTER — Encounter: Payer: Self-pay | Admitting: Physical Therapy

## 2023-02-27 ENCOUNTER — Ambulatory Visit: Payer: 59 | Attending: Family Medicine | Admitting: Physical Therapy

## 2023-02-27 ENCOUNTER — Encounter: Payer: 59 | Admitting: Physical Therapy

## 2023-02-27 DIAGNOSIS — G8929 Other chronic pain: Secondary | ICD-10-CM

## 2023-02-27 DIAGNOSIS — H8113 Benign paroxysmal vertigo, bilateral: Secondary | ICD-10-CM

## 2023-02-27 DIAGNOSIS — M5441 Lumbago with sciatica, right side: Secondary | ICD-10-CM | POA: Diagnosis not present

## 2023-02-27 DIAGNOSIS — R42 Dizziness and giddiness: Secondary | ICD-10-CM | POA: Diagnosis not present

## 2023-02-27 DIAGNOSIS — M542 Cervicalgia: Secondary | ICD-10-CM | POA: Insufficient documentation

## 2023-02-27 DIAGNOSIS — R293 Abnormal posture: Secondary | ICD-10-CM

## 2023-02-27 DIAGNOSIS — R252 Cramp and spasm: Secondary | ICD-10-CM | POA: Insufficient documentation

## 2023-02-27 NOTE — Therapy (Signed)
OUTPATIENT PHYSICAL THERAPY TREATMENT  D/C FROM SHOULDER/NECK/BACK  Patient Name: [name] MRN: [id number] DOB:04/07/1946, 77 y.o., female Today's Date: 02/27/2023   PT End of Session - 02/27/23 0957     Visit Number 22    Number of Visits 25    Date for PT Re-Evaluation 04/09/23    Authorization Type [employer] Medicare    Progress Note Due on Visit 25    PT Start Time 1007    PT Stop Time 1046    PT Time Calculation (min) 39 min    Activity Tolerance Patient tolerated treatment well    Behavior During Therapy WFL for tasks assessed/performed                        Past Medical History:  Diagnosis Date   Acute MI, subendocardial ([place])    Arthritis    Back pain 05/31/2013   Benign paroxysmal positional vertigo 08/05/2016   CAD (coronary artery disease)    a. NSTEMI 10/2011: [place] 11/19/11: pLAD 30%, oOM 60%, AVCFX 30%, CFX after OM2 30%, pRCA 60 and 70%, then 99%, AM 80-90% with TIMI 3 flow.  PCI: Promus DES x 2 to RCA; b. 06/2012 Cath: patent RCA stents w/ subtl occl of Acute Marginal (jailed)->Med rx; c. 05/2015 MV: EF 59%, mod mid infsept/inf/ap lat/ap infarct with peri-infarct isch-->Med Rx; d. 02/2016 Cath: LM nl, LAD 7m RI 50, RCA patent stents.   Colitis    Facial skin lesion 01/28/2017   GERD (gastroesophageal reflux disease)    occasional   Hyperlipidemia    Hypertension    Hypertensive heart disease    a. Echocardiogram 11/19/11: Difficult acoustic windows, EF 60-65%, normal LV wall thickness, grade 1 diastolic dysfunction   Hypoparathyroidism (HCC)    Low zinc level 11/26/2016   Multinodular thyroid    Goiter s/p thyroidectomy in 2007 with post-op  hypocalcemia and post-op hypothyroidism/hypoparathyroidism with hypocalcemia   Neck pain on right side 07/13/2013   Nocturia 05/31/2013   Osteoarthritis    Pain in right axilla 03/13/2016   Palpitations    a. 03/2012 - patient set up for event monitor but did not wear correctly -  she declined wearing a repeat  monitor   Post-surgical hypothyroidism    Sun-damaged skin 12/05/2014   Tubular adenoma of colon 06/2011   Unspecified constipation 05/31/2013   Vertigo    Zinc deficiency 11/26/2015   Past Surgical History:  Procedure Laterality Date   CARDIAC CATHETERIZATION N/A 03/08/2016   Procedure: Left Heart Cath and Coronary Angiography;  Surgeon: JJettie Booze MD;  [facility];  Service: Cardiovascular;  Laterality: N/A;   COLONOSCOPY  Aenomatous polyps   07/05/2011   COLONOSCOPY N/A 09/28/2014   Procedure: COLONOSCOPY;  Surgeon: MLadene Artist MD;  Location: [facility];  Service: Endoscopy;  Laterality: N/A;   CORONARY ANGIOPLASTY WITH STENT PLACEMENT     LEFT HEART CATH AND CORONARY ANGIOGRAPHY N/A 12/09/2018   Procedure: LEFT HEART CATH AND CORONARY ANGIOGRAPHY;  Surgeon: VJettie Booze MD;  [facility];  Service: Cardiovascular;  Laterality: N/A;   LEFT HEART CATHETERIZATION WITH CORONARY ANGIOGRAM N/A 07/17/2012   Procedure: LEFT HEART CATHETERIZATION WITH CORONARY ANGIOGRAM;  Surgeon: THillary Bow MD;  [facility];  Service: Cardiovascular;  Laterality: N/A;   PERCUTANEOUS CORONARY STENT INTERVENTION (PCI-S) N/A 11/19/2011   Procedure: PERCUTANEOUS CORONARY STENT INTERVENTION (PCI-S);  Surgeon: THillary Bow MD;  [facility];  Service: Cardiovascular;  Laterality: N/A;   POLYPECTOMY     SHOULDER ARTHROSCOPY W/ ROTATOR CUFF REPAIR     right   TOTAL THYROIDECTOMY  2007   GOITER   Patient Active Problem List   Diagnosis Date Noted   Sinus tarsi syndrome of right ankle 02/18/2023   Edema 02/17/2023   Acute prerenal azotemia 02/06/2023   TIA (transient ischemic attack) 02/05/2023   Paresthesia 08/28/2022   Chronic neck pain 08/28/2022   Neuropathy 08/21/2022   Vitamin deficiency 10/23/2021   Hypoglycemia 10/07/2019   Heart disease 01/29/2019   Sensorineural hearing loss (SNHL) of both ears 01/27/2019    Primary osteoarthritis of both knees 12/19/2018   DDD (degenerative disc disease), lumbar 12/19/2018   ANA positive 10/27/2018   Anxiety 02/17/2018   Peripheral arterial disease ([place]) 02/20/2017   Skin lesion of face 01/28/2017   Benign paroxysmal positional vertigo 08/05/2016   Antiplatelet or antithrombotic long-term use 03/27/2016   Right lumbar radiculopathy 02/21/2016   Abnormality of gait 02/21/2016   Zinc deficiency 11/26/2015   Right hip pain 10/26/2015   Medicare annual wellness visit, subsequent 03/13/2015   Preventative health care 03/13/2015   Sun-damaged skin 12/05/2014   Angina of effort 12/02/2014   Hx of colonic polyps 09/28/2014   Benign neoplasm of descending colon 09/28/2014   Other fatigue 09/10/2014   Personal history of colonic polyps 07/16/2014   Bilateral thumb pain 03/20/2014   Postsurgical hypoparathyroidism ([place]) 08/03/2013   Neck pain on right side 07/13/2013   Constipation 05/31/2013   Back pain 05/31/2013   Nocturia 05/31/2013   GERD (gastroesophageal reflux disease) 11/02/2012   Tension headache 11/02/2012   Anemia 06/11/2012   Hypokalemia 06/11/2012   Depression 01/21/2012   Headache(784.0) 12/23/2011   Sinus bradycardia 12/07/2011   Hypocalcemia 12/07/2011   Acute MI, subendocardial ([place]) 11/20/2011   Acquired hypothyroidism 05/20/2009   Essential hypertension, benign 05/20/2009   Hyperlipidemia 03/08/2009   CAD, NATIVE VESSEL 03/08/2009    PCP: Mosie Lukes, MD  REFERRING PROVIDER: Glenford Peers, NP  REFERRING DIAG: [phone] (ICD-10-CM) - Other spondylosis with radiculopathy, cervical region  THERAPY DIAG:  Cervicalgia  Abnormal posture  BPPV (benign paroxysmal positional vertigo), bilateral  Dizziness and giddiness  Chronic right-sided low back pain with right-sided sciatica  Cramp and spasm  Rationale for Evaluation and Treatment: Rehabilitation  ONSET DATE: chronic for years  SUBJECTIVE:  SUBJECTIVE STATEMENT: Patient reports no shoulder pain at rest. Up to 7/10 with carrying gallon of milk and reaching to cabinet. Much better and happy with progress. Neck is better only gets dizzy when turning fast. Pain is better. She still gets tightness upper traps. Much better shoulders and neck. Good response to initial session for BBPV. Had vertigo this morning but drove herself so she did not want treatment for this today.  PERTINENT HISTORY:  Surgical hypoparathyrodism, angina (has nitro), Hypothyroidism, hyperlipidemia, HTN, neuropathy hands and feet, R RTC repair  PAIN:  Are you having pain? Yes: NPRS scale: 0-7/10 Pain location: R shoulder Pain description: sometimes sharp shooting, sometimes arms feel weak, constant pain Aggravating factors: moving head overhead, reaching behind her back, turning neck (gets dizzy),  Relieving factors: holding arms close, avoiding movement.   Are you having pain? Yes: NPRS scale: 7/10 Pain location: neck Pain description: pain Aggravating factors: turning to R Relieving factors: heat  PRECAUTIONS: None  WEIGHT BEARING RESTRICTIONS: No  FALLS:  Has patient fallen in last 6 months? No  LIVING ENVIRONMENT: Lives with: lives alone Lives in: House/apartment Stairs: No Has following equipment at home: None  OCCUPATION: retired  PLOF: Independent and Leisure: was going to [employer] but stopped due to pain  PATIENT GOALS: I want to be pain free without medications, know what machines are safe to use  NEXT MD VISIT: 02/02/23 with PCP  OBJECTIVE:   DIAGNOSTIC FINDINGS:  MRI done but not available to review  PATIENT SURVEYS:  NDI 23/50 = 46%  moderate disability   02/27/23 13 / 50 = 26.0 %  COGNITION: Overall cognitive status: Within functional limits for tasks  assessed  SENSATION: Light touch: WFL  POSTURE: rounded shoulders and forward head  PALPATION: Tenderness throughout bil upper traps, levator scapulae, and cervical paraspinals, wincing and jumping away from firm touch.    CERVICAL ROM:   Active ROM AROM (deg) eval AROM 12/19/22 AROM 01/23/23 AROM 01/31/2023 AROM 02/10/23 AROM 3/6/7  Flexion 15 22 34 20    Extension '20 25 24 20    '$ Right lateral flexion 10 29      Left lateral flexion 15 32      Right rotation 40 50 45 42 59 47  Left rotation 40 50 57 35 45 26   (Blank rows = not tested)  UPPER EXTREMITY MMT:   MMT Right eval Left eval Right 02/27/23 Left 02/27/23  Shoulder flexion 4+ 4+ 4-* 4-  Shoulder extension   5 5  Shoulder abduction 4+* 4 4+ 4*  Shoulder adduction      Shoulder internal rotation 5 5    Shoulder external rotation 5 5    Elbow flexion 5 5    Elbow extension 5 5    Wrist flexion 5 5    Wrist extension 5 5     (Blank rows = not tested)  UPPER EXTREMITY ROM:  MMT Right eval Left eval Right 02/27/23 Left 02/27/23  Shoulder flexion 115* 120 121 stand/ 130 stand  Shoulder extension 40 65 35 pt won't go furrther  60  Shoulder abduction 100 135 124 stand 146 stand  Shoulder adduction      Shoulder internal rotation   To L2 To L2  Shoulder external rotation   Hands behind head Hands behind head   (Blank rows = not tested)  LOWER EXTREMITY MMT:    MMT Right eval Left eval  Hip flexion 4 4  Hip extension    Hip  abduction 5 5  Hip adduction 4+ 4+  Hip internal rotation    Hip external rotation    Knee flexion    Knee extension 5 5  Ankle dorsiflexion 5 5  Ankle plantarflexion     (Blank rows = not tested)   LUMBAR ROM:   Active  AROM  11/09/2022 AROM 01/01/23  Flexion To mid-thigh Mid shin  Extension WNL, no pain, but increased pain after repeated 5x Limited 50% - pain  Right lateral flexion Inc pain R side back, to knee To knee - increased pain  Left lateral flexion To mid thigh,  increased pain R side back To knee - increased pain  Right rotation WNL WNL  Left rotation WNL WNL   (Blank rows = not tested)   CERVICAL SPECIAL TESTS:  Spurling's test: Positive right  FUNCTIONAL TESTS:  5 times sit to stand: 35 seconds Increased sciatic pain with R leg extension in sitting.   5x STS: 31 seconds (01/01/23) TODAY'S TREATMENT:                                                                                                                              DATE: 02/27/23  Therapeutic Exercise: to improve strength and mobility.  Demo, verbal and tactile cues throughout for technique. Reviewed HEP for neck/shoulders: Seated cervcial retraction - cues to avoid hyperprotraction on release Seated scapular retraction and shoulder rolls Seated neck extension with strap - cues to do various levels and not pull too hard Seated cervical rotation to R with strap - cues for correct hand placement Standing open book x 5 ea Standing shoulder isometrics - flex/ext/IR/ER  Therapeutic Activities: ROM, MMT, goals assessed, NDI   02/20/23  Therapeutic Exercise: to improve strength and mobility.  Demo, verbal and tactile cues throughout for technique. UBE forward x 6 min Seated shld squeeze x 10 Seated shoulder rolls x 10 SNAG cervical ext x 10  Low pec stretch in doorway 2x30 sec  Assessed cervical ROM (LTG #3)  PATIENT EDUCATION:  Education details: HEP review Person educated: Patient Education method: Consulting civil engineer, Demonstration, Verbal cues, and Handouts Education comprehension: verbalized understanding and returned demonstration  HOME EXERCISE PROGRAM: Access Code: [email or web address] URL: [email or web address] Date: 01/31/2023 Prepared by: Glenetta Hew  Exercises - Seated Cervical Retraction  - 3 x daily - 7 x weekly - 1 sets - 10 reps - Seated Scapular Retraction  - 3 x daily - 7 x weekly - 1 sets - 10 reps - Seated Shoulder Rolls  - 3 x daily - 7 x weekly - 1  sets - 10 reps - Cervical Extension AROM with Strap  - 1 x daily - 7 x weekly - 1 sets - 10 reps - Seated Assisted Cervical Rotation with Towel  - 1 x daily - 7 x weekly - 1 sets - 10 reps - Standing Thoracic Open Book at [place] 1 x daily - 7 x weekly - 1 sets -  10 reps - Supine Bridge with Resistance Band  - 1 x daily - 7 x weekly - 2 sets - 10 reps - Clamshell with Resistance  - 1 x daily - 7 x weekly - 2 sets - 10 reps - Supine 90/90 Alternating Toe Touch  - 1 x daily - 7 x weekly - 2 sets - 10 reps - Supine Active Straight Leg Raise  - 1 x daily - 7 x weekly - 2 sets - 10 reps - Forward T with Counter Support  - 1 x daily - 7 x weekly - 2 sets - 10 reps - Seated Quadratus Lumborum Stretch in Chair  - 1 x daily - 7 x weekly - 3 sets - 15 sec hold - Seated Scapular Retraction  - 3 x daily - 7 x weekly - 1 sets - 5 reps - 5 sec  hold - Seated Isometric Shoulder Internal Rotation with Towel  - 3 x daily - 7 x weekly - 1 sets - 5 reps - 5 sec hold - Seated Isometric Shoulder External Rotation  - 3 x daily - 7 x weekly - 1 sets - 5 reps - 5 sec hold - Isometric Shoulder Flexion at Wall  - 3 x daily - 7 x weekly - 1 sets - 5 reps - 5 sec hold - Isometric Shoulder Extension at Wall  - 3 x daily - 7 x weekly - 1 sets - 5 reps - 5 sec hold  ASSESSMENT:  CLINICAL IMPRESSION: Ailine T Sabia presents today reporting good response to vestibular eval, but return of vertigo this morning. She drove herself so did not want treatment for this. She reports that she is pleased with her current level of function in her neck and R shoulder and is ready to be discharged to her HEP. She has not met all of her LTGs, but reports compliance with HEP. ROM and strength were less in some areas today. Patient reports that her ability is dependent on her pain which is good some days and bad some days. Lak[place]ia is also reluctant to move her neck or resist MMT for fear of pain in many instances. She will continue to be seen  for BPPV until the end of her POC.   OBJECTIVE IMPAIRMENTS: decreased activity tolerance, decreased mobility, decreased ROM, decreased strength, dizziness, hypomobility, increased fascial restrictions, impaired perceived functional ability, increased muscle spasms, impaired flexibility, postural dysfunction, and pain.   ACTIVITY LIMITATIONS: carrying, lifting, bending, standing, sleeping, stairs, transfers, dressing, reach over head, and hygiene/grooming  PARTICIPATION LIMITATIONS: meal prep, cleaning, laundry, driving, shopping, and community activity  PERSONAL FACTORS: Age, Time since onset of injury/illness/exacerbation, and 3+ comorbidities: angina, hypothyroidism, neck surgery, neuropathy, R RTC repair, osteoporosis, CAD, history MI, BPPV, chronic low back pain  are also affecting patient's functional outcome.   REHAB POTENTIAL: Good  CLINICAL DECISION MAKING: Evolving/moderate complexity  EVALUATION COMPLEXITY: Moderate   GOALS: Goals reviewed with patient? Yes  SHORT TERM GOALS: Target date: 11/20/2022   Patient will be independent with initial HEP.  Baseline: given Goal status: MET - 11/21/22   LONG TERM GOALS: Target date: 02/28/2023   Patient will be independent with advanced/ongoing HEP to improve outcomes and carryover.  Baseline:  Goal status: MET   2.  Patient will report 75% improvement in neck pain to improve QOL.  Baseline: severe Goal status: NOT MET- 12/19/22 65% improvement 01/31/2023- 70% improvement.   3.  Patient will demonstrate improved cervical ROM by 10 deg all  directions for safety with driving.  Baseline: see objective Goal status: NOT MET-  4.  Patient will report 15% improvement on NDI to demonstrate improved functional ability.  Baseline: 46% Goal status: MET 01/31/23- 34% 02/27/23 = 26.0 %  5.  Patient will report 75% improvement in low back pain to improve quality of life. Baseline:  Comes and goes Goal status: MET- 11/27/22  60-70% ; 01/31/23-  75%  6. Patient will report 12% improvement on modified Oswestry to demonstrate improved functional ability. Baseline: 12/19/22 -30% Goal status: MET   01/31/23- 18% impairment.   7. Patient will will be able to return to community-based exercise program to maintain progress. Baseline: plans to return in April Goal status: MET    PLAN:  PT FREQUENCY: 1-2x/week  PT DURATION: 4 weeks  PLANNED INTERVENTIONS: Therapeutic exercises, Therapeutic activity, Neuromuscular re-education, Balance training, Gait training, Patient/Family education, Self Care, Joint mobilization, Stair training, Dry Needling, Electrical stimulation, Spinal mobilization, Cryotherapy, Moist heat, Taping, Traction, Ultrasound, Manual therapy, and Re-evaluation  PLAN FOR NEXT SESSION: d/c neck and shoulder; continue with vertigo treatment.    Amiah Frohlich, PT 02/27/2023, 12:27 PM

## 2023-02-28 ENCOUNTER — Other Ambulatory Visit: Payer: Self-pay | Admitting: Internal Medicine

## 2023-02-28 DIAGNOSIS — I214 Non-ST elevation (NSTEMI) myocardial infarction: Secondary | ICD-10-CM

## 2023-02-28 DIAGNOSIS — I2510 Atherosclerotic heart disease of native coronary artery without angina pectoris: Secondary | ICD-10-CM

## 2023-03-13 ENCOUNTER — Ambulatory Visit: Payer: 59 | Admitting: Physical Therapy

## 2023-03-13 ENCOUNTER — Encounter: Payer: Self-pay | Admitting: Physical Therapy

## 2023-03-13 DIAGNOSIS — G8929 Other chronic pain: Secondary | ICD-10-CM

## 2023-03-13 DIAGNOSIS — M542 Cervicalgia: Secondary | ICD-10-CM | POA: Diagnosis not present

## 2023-03-13 DIAGNOSIS — H8113 Benign paroxysmal vertigo, bilateral: Secondary | ICD-10-CM

## 2023-03-13 DIAGNOSIS — R42 Dizziness and giddiness: Secondary | ICD-10-CM

## 2023-03-13 DIAGNOSIS — M5441 Lumbago with sciatica, right side: Secondary | ICD-10-CM | POA: Diagnosis not present

## 2023-03-13 DIAGNOSIS — R252 Cramp and spasm: Secondary | ICD-10-CM | POA: Diagnosis not present

## 2023-03-13 DIAGNOSIS — R293 Abnormal posture: Secondary | ICD-10-CM | POA: Diagnosis not present

## 2023-03-13 NOTE — Therapy (Signed)
OUTPATIENT PHYSICAL THERAPY Treatment      Patient Name: [name] MRN: [id number] DOB:07-07-1946, 77 y.o., female Today's Date: 03/13/2023  END OF SESSION:  PT End of Session - 03/13/23 1441     Visit Number 23    Number of Visits 25    Date for PT Re-Evaluation 04/09/23    Authorization Type [employer] Medicare    Progress Note Due on Visit 25    PT Start Time 1443    Activity Tolerance Patient tolerated treatment well    Behavior During Therapy [facility] for tasks assessed/performed             Past Medical History:  Diagnosis Date   Acute MI, subendocardial ([place])    Arthritis    Back pain 05/31/2013   Benign paroxysmal positional vertigo 08/05/2016   CAD (coronary artery disease)    a. NSTEMI 10/2011: [place] 11/19/11: pLAD 30%, oOM 60%, AVCFX 30%, CFX after OM2 30%, pRCA 60 and 70%, then 99%, AM 80-90% with TIMI 3 flow.  PCI: Promus DES x 2 to RCA; b. 06/2012 Cath: patent RCA stents w/ subtl occl of Acute Marginal (jailed)->Med rx; c. 05/2015 MV: EF 59%, mod mid infsept/inf/ap lat/ap infarct with peri-infarct isch-->Med Rx; d. 02/2016 Cath: LM nl, LAD 87m, RI 50, RCA patent stents.   Colitis    Facial skin lesion 01/28/2017   GERD (gastroesophageal reflux disease)    occasional   Hyperlipidemia    Hypertension    Hypertensive heart disease    a. Echocardiogram 11/19/11: Difficult acoustic windows, EF 60-65%, normal LV wall thickness, grade 1 diastolic dysfunction   Hypoparathyroidism (HCC)    Low zinc level 11/26/2016   Multinodular thyroid    Goiter s/p thyroidectomy in 2007 with post-op  hypocalcemia and post-op hypothyroidism/hypoparathyroidism with hypocalcemia   Neck pain on right side 07/13/2013   Nocturia 05/31/2013   Osteoarthritis    Pain in right axilla 03/13/2016   Palpitations    a. 03/2012 - patient set up for event monitor but did not wear correctly -  she declined wearing a repeat monitor   Post-surgical hypothyroidism    Sun-damaged skin 12/05/2014   Tubular  adenoma of colon 06/2011   Unspecified constipation 05/31/2013   Vertigo    Zinc deficiency 11/26/2015   Past Surgical History:  Procedure Laterality Date   CARDIAC CATHETERIZATION N/A 03/08/2016   Procedure: Left Heart Cath and Coronary Angiography;  Surgeon: Jettie Booze, MD;  Location: [place] [facility];  Service: Cardiovascular;  Laterality: N/A;   COLONOSCOPY  Aenomatous polyps   07/05/2011   COLONOSCOPY N/A 09/28/2014   Procedure: COLONOSCOPY;  Surgeon: Ladene Artist, MD;  Location: [facility];  Service: Endoscopy;  Laterality: N/A;   CORONARY ANGIOPLASTY WITH STENT PLACEMENT     LEFT HEART CATH AND CORONARY ANGIOGRAPHY N/A 12/09/2018   Procedure: LEFT HEART CATH AND CORONARY ANGIOGRAPHY;  Surgeon: Jettie Booze, MD;  Location: [place] [facility];  Service: Cardiovascular;  Laterality: N/A;   LEFT HEART CATHETERIZATION WITH CORONARY ANGIOGRAM N/A 07/17/2012   Procedure: LEFT HEART CATHETERIZATION WITH CORONARY ANGIOGRAM;  Surgeon: Hillary Bow, MD;  Location: [facility] C[facility] LAB;  Service: Cardiovascular;  Laterality: N/A;   PERCUTANEOUS CORONARY STENT INTERVENTION (PCI-S) N/A 11/19/2011   Procedure: PERCUTANEOUS CORONARY STENT INTERVENTION (PCI-S);  Surgeon: Hillary Bow, MD;  Location: [facility] CATH LAB;  Service: Cardiovascular;  Laterality: N/A;   POLYPECTOMY     SHOULDER ARTHROSCOPY W/ ROTATOR CUFF REPAIR  right   TOTAL THYROIDECTOMY  2007   GOITER   Patient Active Problem List   Diagnosis Date Noted   Sinus tarsi syndrome of right ankle 02/18/2023   Edema 02/17/2023   Acute prerenal azotemia 02/06/2023   TIA (transient ischemic attack) 02/05/2023   Paresthesia 08/28/2022   Chronic neck pain 08/28/2022   Neuropathy 08/21/2022   Vitamin deficiency 10/23/2021   Hypoglycemia 10/07/2019   Heart disease 01/29/2019   Sensorineural hearing loss (SNHL) of both ears 01/27/2019   Primary osteoarthritis of both knees 12/19/2018   DDD (degenerative disc disease),  lumbar 12/19/2018   ANA positive 10/27/2018   Anxiety 02/17/2018   Peripheral arterial disease ([place]) 02/20/2017   Skin lesion of face 01/28/2017   Benign paroxysmal positional vertigo 08/05/2016   Antiplatelet or antithrombotic long-term use 03/27/2016   Right lumbar radiculopathy 02/21/2016   Abnormality of gait 02/21/2016   Zinc deficiency 11/26/2015   Right hip pain 10/26/2015   Medicare annual wellness visit, subsequent 03/13/2015   Preventative health care 03/13/2015   Sun-damaged skin 12/05/2014   Angina of effort 12/02/2014   Hx of colonic polyps 09/28/2014   Benign neoplasm of descending colon 09/28/2014   Other fatigue 09/10/2014   Personal history of colonic polyps 07/16/2014   Bilateral thumb pain 03/20/2014   Postsurgical hypoparathyroidism ([place]) 08/03/2013   Neck pain on right side 07/13/2013   Constipation 05/31/2013   Back pain 05/31/2013   Nocturia 05/31/2013   GERD (gastroesophageal reflux disease) 11/02/2012   Tension headache 11/02/2012   Anemia 06/11/2012   Hypokalemia 06/11/2012   Depression 01/21/2012   Headache(784.0) 12/23/2011   Sinus bradycardia 12/07/2011   Hypocalcemia 12/07/2011   Acute MI, subendocardial ([place]) 11/20/2011   Acquired hypothyroidism 05/20/2009   Essential hypertension, benign 05/20/2009   Hyperlipidemia 03/08/2009   CAD, NATIVE VESSEL 03/08/2009    PCP: Penni Homans, MD REFERRING PROVIDER: Penni Homans, MD  REFERRING DIAG: BPPV  THERAPY DIAG:  BPPV (benign paroxysmal positional vertigo), bilateral  Dizziness and giddiness  Cervicalgia  ONSET DATE: 2014  Rationale for Evaluation and Treatment: Rehabilitation  SUBJECTIVE:   SUBJECTIVE STATEMENT: I don't want to work on my dizziness today.  I want to try the needles for my back.  My back started hurting bad 3 days ago, when working in my kitchen and I can't sleep, I'm traveling on Monday.   Pt accompanied by: friend  PERTINENT HISTORY: h/o chronic vertigo,  referred to PT by PCP to assess for BPPV  PAIN:  Are you having pain? Yes: NPRS scale: 5-6/10 Pain location: right side lower back, radiating down her R glutes to above knee  Pain description: tender to touch Aggravating factors: twisting, bending Relieving factors: nothing  PRECAUTIONS: None  WEIGHT BEARING RESTRICTIONS: No  FALLS: Has patient fallen in last 6 months? No  LIVING ENVIRONMENT: Lives with: lives alone Lives in: House/apartment Stairs: No Has following equipment at home: None  PLOF: Independent  PATIENT GOALS: get rid of dizziness, nausea  OBJECTIVE:   DIAGNOSTIC FINDINGS: MRI at hospital recently brain and CT due to TIA/ stroke Sx , and both with negative results  COGNITION: Overall cognitive status: Within functional limits for tasks assessed   SENSATION: WFL  EDEMA:  None noted   POSTURE:  rounded shoulders, forward head, and increased thoracic kyphosis  Cervical ROM:    Active A/PROM (deg) eval  Flexion 70%  Extension 50%  Right lateral flexion   Left lateral flexion   Right rotation 50%  Left rotation  40%  (Blank rows = not tested)  STRENGTH: L thumb extension 3+/5, all other MMT elbow and distally wnl, shoulders not assessed, currently under care for R shoulder pain/ injury  LOWER EXTREMITY MMT: grossly wnl  BED MOBILITY:  Sit to supine Modified independence Supine to sit Modified independence Rolling to Right Modified independence Rolling to Left Modified independence  TRANSFERS:all wnl RAMP:  na  CURB:  na  GAIT: Gait pattern: WFL Distance walked: in clinic over 50' Assistive device utilized: None Level of assistance: Complete Independence  FUNCTIONAL TESTS:    PATIENT SURVEYS:  [place] 47/80 or 42% deficit  VESTIBULAR ASSESSMENT:  GENERAL OBSERVATION: guarded movements, frequently closes eyes , slow movements, avoids neck rotation with movement transitions and resting posture   SYMPTOM BEHAVIOR:  Subjective  history: 10 yr h.o vertigo, no previous Rx  Non-Vestibular symptoms: neck pain, nausea/vomiting, and numbness l side of mouth  Type of dizziness: Spinning/Vertigo and Lightheadedness/Faint  Frequency: daily  Duration: a few secs to several mins  Aggravating factors: Induced by position change: rolling to the right, rolling to the left, and supine to sit, Induced by motion: bending down to the ground, turning body quickly, and turning head quickly, Worse in the dark, and Moving eyes  Relieving factors: head stationary and closing eyes  Progression of symptoms: worse  OCULOMOTOR EXAM:  Ocular Alignment: normal  Ocular ROM: No Limitations  Spontaneous Nystagmus: right beating  Gaze-Induced Nystagmus: right beating with right gaze  Smooth Pursuits: saccades  Saccades: hypermetric/overshoots and extra eye movements  Convergence/Divergence:na today    Gaze-Induced Nystagmus: absent   Positional tests: Right Dix-Hallpike: no nystagmus Left Dix-Hallpike: no nystagmus Right Roll Test: symptoms worse on left side Left Roll Test: symptoms worse on left side  VESTIBULAR - OCULAR REFLEX: nt today  POSITIONAL TESTING: Right Dix-Hallpike: no nystagmus Left Dix-Hallpike: no nystagmus Right Roll Test: no nystagmus Left Roll Test: no nystagmus  MOTION SENSITIVITY:  Motion Sensitivity Quotient Intensity: 0 = none, 1 = Lightheaded, 2 = Mild, 3 = Moderate, 4 = Severe, 5 = Vomiting  Intensity  1. Sitting to supine   2. Supine to L side   3. Supine to R side   4. Supine to sitting   5. L Hallpike-Dix   6. Up from L    7. R Hallpike-Dix   8. Up from R    9. Sitting, head tipped to L knee   10. Head up from L knee   11. Sitting, head tipped to R knee   12. Head up from R knee   13. Sitting head turns x5   14.Sitting head nods x5   15. In stance, 180 turn to L    16. In stance, 180 turn to R     OTHOSTATICS: not done  FUNCTIONAL GAIT:  na today   VESTIBULAR TREATMENT:                                                                                                    DATE:   03/13/23  Therapeutic Activity:  assessment low back pain Manual  Therapy: to decrease muscle spasm and pain and improve mobility R UPA mobs lumbar spine grade 1-2, STM/TPR to R lumbar paraspinals, glutes and piriformis, skilled palpation and monitoring during dry needling. Trigger Point Dry-Needling  Treatment instructions: Expect mild to moderate muscle soreness.  Patient verbalized understanding of these instructions and education. Patient Consent Given: Yes Education handout provided: Previously provided Muscles treated: R piriformis Treatment response/outcome: Twitch Response Elicited and Palpable Increase in Muscle Length Modalities: IFC to lumbar spine x 15 min with CP to low back in prone.     02/26/23 Canalith Repositioning:  Epley Right: Number of Reps: 1 and Response to Treatment: symptoms improved   PATIENT EDUCATION: Education details: apply CP to low back x 15 min at home as needed for pain, contact PCP Person educated: Patient Education method: Explanation Education comprehension: verbalized understanding  HOME EXERCISE PROGRAM:  GOALS: Goals reviewed with patient? Yes  SHORT TERM GOALS: Target date: 03/12/23  Patient will report compliance with initial HEP.  Baseline:not initiated yet Goal status: IN PROGRESS  2.  Patient will report resolution of dizziness rolling over in bed.  Baseline: consistent dizziness Goal status: IN PROGRESS 03/13/23- improving  3.  Patient will complete further balance testing.  Baseline:  Goal status: IN PROGRESS LONG TERM GOALS: Target date: 04/09/23   1.Patient will be independent with progressed HEP to improve outcomes and carryover.  Baseline: not initiated yet Goal status: IN PROGRESS        2. Patient will demonstrate 26/30 score on FGA to decrease risk of falls.  Baseline: na yet Goal status: IN PROGRESS   Patient will report 56  or greater on DHI to demonstrate improved QOL. Baseline: 47 Goal status: IN PROGRESS  ASSESSMENT:  CLINICAL IMPRESSION: [name] reports significant improvement already in dizziness symptoms.  She returns today with new onset severe LBP, which she has been seeing this PT for therapy just recently.  Since she was referred for PT for her LBP (BPPV referral added at the recommendation of this PT),  evaluated her LBP, discussed that it is not recommended to perform TrDN to lumbar region with acute LBP, but that we could certainly perform modalities to decrease pain today.  She had significant tenderness to R UPA mobs at L4/5, but responded well to manual therapy and TPR including TrDN to R piriformis which decreased pain in R buttock significantly, followed by Estim and MHP to low back.  Reported significantly decreased pain following interventions, no pain with gait (antalgic gait entering session).  Recommended she continue to apply CP at home and contact her PCP for appointment, but mostly likely muscular as no muscle weakness or numbness or tingling.  [name] continues to demonstrate potential for improvement and would benefit from continued skilled therapy to address impairments.     OBJECTIVE IMPAIRMENTS: decreased activity tolerance, decreased ROM, dizziness, and postural dysfunction.   ACTIVITY LIMITATIONS: lifting, bending, sleeping, bed mobility, and reach over head  PARTICIPATION LIMITATIONS: cleaning, laundry, and community activity  PERSONAL FACTORS: Time since onset of injury/illness/exacerbation and 1-2 comorbidities: arthritis multiple sites,HTN  are also affecting patient's functional outcome.   REHAB POTENTIAL: Good  CLINICAL DECISION MAKING: Stable/uncomplicated  EVALUATION COMPLEXITY: Low   PLAN:  PT FREQUENCY: 1-2x/week  PT DURATION: 6 weeks  PLANNED INTERVENTIONS: Therapeutic exercises, Therapeutic activity, Neuromuscular re-education, Balance  training, Gait training, Patient/Family education, Self Care, Joint mobilization, and Canalith repositioning  PLAN FOR NEXT SESSION: reassess BPPV, perform VOR tests , further assess cervical  spine   Rennie Natter, PT, DPT  03/13/2023, 2:42 PM

## 2023-03-14 ENCOUNTER — Ambulatory Visit: Payer: 59 | Admitting: Physical Therapy

## 2023-03-14 ENCOUNTER — Encounter: Payer: Self-pay | Admitting: Physical Therapy

## 2023-03-14 DIAGNOSIS — M542 Cervicalgia: Secondary | ICD-10-CM

## 2023-03-14 DIAGNOSIS — R252 Cramp and spasm: Secondary | ICD-10-CM | POA: Diagnosis not present

## 2023-03-14 DIAGNOSIS — M5441 Lumbago with sciatica, right side: Secondary | ICD-10-CM | POA: Diagnosis not present

## 2023-03-14 DIAGNOSIS — R42 Dizziness and giddiness: Secondary | ICD-10-CM | POA: Diagnosis not present

## 2023-03-14 DIAGNOSIS — R293 Abnormal posture: Secondary | ICD-10-CM | POA: Diagnosis not present

## 2023-03-14 DIAGNOSIS — G8929 Other chronic pain: Secondary | ICD-10-CM | POA: Diagnosis not present

## 2023-03-14 DIAGNOSIS — H8113 Benign paroxysmal vertigo, bilateral: Secondary | ICD-10-CM | POA: Diagnosis not present

## 2023-03-14 NOTE — Therapy (Signed)
OUTPATIENT PHYSICAL THERAPY Treatment      Patient Name: [name] MRN: [id number] DOB:Apr 27, 1946, 77 y.o.,, female Today's Date: 03/14/2023  END OF SESSION:  PT End of Session - 03/14/23 1525     Visit Number 24    Number of Visits 27    Date for PT Re-Evaluation 04/09/23    Authorization Type [employer] Medicare    Progress Note Due on Visit 27    PT Start Time 1453    PT Stop Time 1533    PT Time Calculation (min) 40 min    Activity Tolerance Patient tolerated treatment well    Behavior During Therapy [facility] for tasks assessed/performed             Past Medical History:  Diagnosis Date   Acute MI, subendocardial ([place])    Arthritis    Back pain 05/31/2013   Benign paroxysmal positional vertigo 08/05/2016   CAD (coronary artery disease)    a. NSTEMI 10/2011: [place] 11/19/11: pLAD 30%, oOM 60%, AVCFX 30%, CFX after OM2 30%, pRCA 60 and 70%, then 99%, AM 80-90% with TIMI 3 flow.  PCI: Promus DES x 2 to RCA; b. 06/2012 Cath: patent RCA stents w/ subtl occl of Acute Marginal (jailed)->Med rx; c. 05/2015 MV: EF 59%, mod mid infsept/inf/ap lat/ap infarct with peri-infarct isch-->Med Rx; d. 02/2016 Cath: LM nl, LAD 73m, RI 50, RCA patent stents.   Colitis    Facial skin lesion 01/28/2017   GERD (gastroesophageal reflux disease)    occasional   Hyperlipidemia    Hypertension    Hypertensive heart disease    a. Echocardiogram 11/19/11: Difficult acoustic windows, EF 60-65%, normal LV wall thickness, grade 1 diastolic dysfunction   Hypoparathyroidism (HCC)    Low zinc level 11/26/2016   Multinodular thyroid    Goiter s/p thyroidectomy in 2007 with post-op  hypocalcemia and post-op hypothyroidism/hypoparathyroidism with hypocalcemia   Neck pain on right side 07/13/2013   Nocturia 05/31/2013   Osteoarthritis    Pain in right axilla 03/13/2016   Palpitations    a. 03/2012 - patient set up for event monitor but did not wear correctly -  she declined wearing a repeat monitor   Post-surgical  hypothyroidism    Sun-damaged skin 12/05/2014   Tubular adenoma of colon 06/2011   Unspecified constipation 05/31/2013   Vertigo    Zinc deficiency 11/26/2015   Past Surgical History:  Procedure Laterality Date   CARDIAC CATHETERIZATION N/A 03/08/2016   Procedure: Left Heart Cath and Coronary Angiography;  Surgeon: Jettie Booze, MD;  Location: [facility];  Service: Cardiovascular;  Laterality: N/A;   COLONOSCOPY  Aenomatous polyps   07/05/2011   COLONOSCOPY N/A 09/28/2014   Procedure: COLONOSCOPY;  Surgeon: Ladene Artist, MD;  Location: [facility];  Service: Endoscopy;  Laterality: N/A;   CORONARY ANGIOPLASTY WITH STENT PLACEMENT     LEFT HEART CATH AND CORONARY ANGIOGRAPHY N/A 12/09/2018   Procedure: LEFT HEART CATH AND CORONARY ANGIOGRAPHY;  Surgeon: Jettie Booze, MD;  Location: [place] [facility];  Service: Cardiovascular;  Laterality: N/A;   LEFT HEART CATHETERIZATION WITH CORONARY ANGIOGRAM N/A 07/17/2012   Procedure: LEFT HEART CATHETERIZATION WITH CORONARY ANGIOGRAM;  Surgeon: Hillary Bow, MD;  Location: [facility] CATH LAB;  Service: Cardiovascular;  Laterality: N/A;   PERCUTANEOUS CORONARY STENT INTERVENTION (PCI-S) N/A 11/19/2011   Procedure: PERCUTANEOUS CORONARY STENT INTERVENTION (PCI-S);  Surgeon: Hillary Bow, MD;  Location: [facility] CATH LAB;  Service: Cardiovascular;  Laterality: N/A;  POLYPECTOMY     SHOULDER ARTHROSCOPY W/ ROTATOR CUFF REPAIR     right   TOTAL THYROIDECTOMY  2007   GOITER   Patient Active Problem List   Diagnosis Date Noted   Sinus tarsi syndrome of right ankle 02/18/2023   Edema 02/17/2023   Acute prerenal azotemia 02/06/2023   TIA (transient ischemic attack) 02/05/2023   Paresthesia 08/28/2022   Chronic neck pain 08/28/2022   Neuropathy 08/21/2022   Vitamin deficiency 10/23/2021   Hypoglycemia 10/07/2019   Heart disease 01/29/2019   Sensorineural hearing loss (SNHL) of both ears 01/27/2019   Primary osteoarthritis of both  knees 12/19/2018   DDD (degenerative disc disease), lumbar 12/19/2018   ANA positive 10/27/2018   Anxiety 02/17/2018   Peripheral arterial disease ([place]) 02/20/2017   Skin lesion of face 01/28/2017   Benign paroxysmal positional vertigo 08/05/2016   Antiplatelet or antithrombotic long-term use 03/27/2016   Right lumbar radiculopathy 02/21/2016   Abnormality of gait 02/21/2016   Zinc deficiency 11/26/2015   Right hip pain 10/26/2015   Medicare annual wellness visit, subsequent 03/13/2015   Preventative health care 03/13/2015   Sun-damaged skin 12/05/2014   Angina of effort 12/02/2014   Hx of colonic polyps 09/28/2014   Benign neoplasm of descending colon 09/28/2014   Other fatigue 09/10/2014   Personal history of colonic polyps 07/16/2014   Bilateral thumb pain 03/20/2014   Postsurgical hypoparathyroidism ([place]) 08/03/2013   Neck pain on right side 07/13/2013   Constipation 05/31/2013   Back pain 05/31/2013   Nocturia 05/31/2013   GERD (gastroesophageal reflux disease) 11/02/2012   Tension headache 11/02/2012   Anemia 06/11/2012   Hypokalemia 06/11/2012   Depression 01/21/2012   Headache(784.0) 12/23/2011   Sinus bradycardia 12/07/2011   Hypocalcemia 12/07/2011   Acute MI, subendocardial ([place]) 11/20/2011   Acquired hypothyroidism 05/20/2009   Essential hypertension, benign 05/20/2009   Hyperlipidemia 03/08/2009   CAD, NATIVE VESSEL 03/08/2009    PCP: Penni Homans, MD REFERRING PROVIDER: Penni Homans, MD  REFERRING DIAG: BPPV  THERAPY DIAG:  Dizziness and giddiness  Cervicalgia  Chronic right-sided low back pain with right-sided sciatica  ONSET DATE: 2014  Rationale for Evaluation and Treatment: Rehabilitation  SUBJECTIVE:   SUBJECTIVE STATEMENT: I was able to sleep on my R side last night, much better, but it still hurts here on my right side.   Pt accompanied by: friend  PERTINENT HISTORY: h/o chronic vertigo, referred to PT by PCP to assess for  BPPV  PAIN:  Are you having pain? Yes: NPRS scale: 7/10 Pain location: right side lower back  Pain description: tender to touch Aggravating factors: twisting, bending Relieving factors: nothing  PRECAUTIONS: None  WEIGHT BEARING RESTRICTIONS: No  FALLS: Has patient fallen in last 6 months? No  LIVING ENVIRONMENT: Lives with: lives alone Lives in: House/apartment Stairs: No Has following equipment at home: None  PLOF: Independent  PATIENT GOALS: get rid of dizziness, nausea  OBJECTIVE:   DIAGNOSTIC FINDINGS: MRI at hospital recently brain and CT due to TIA/ stroke Sx , and both with negative results  COGNITION: Overall cognitive status: Within functional limits for tasks assessed   SENSATION: WFL  EDEMA:  None noted   POSTURE:  rounded shoulders, forward head, and increased thoracic kyphosis  Cervical ROM:    Active A/PROM (deg) eval  Flexion 70%  Extension 50%  Right lateral flexion   Left lateral flexion   Right rotation 50%  Left rotation 40%  (Blank rows = not tested)  STRENGTH: L  thumb extension 3+/5, all other MMT elbow and distally wnl, shoulders not assessed, currently under care for R shoulder pain/ injury  LOWER EXTREMITY MMT: grossly wnl  BED MOBILITY:  Sit to supine Modified independence Supine to sit Modified independence Rolling to Right Modified independence Rolling to Left Modified independence  TRANSFERS:all wnl RAMP:  na  CURB:  na  GAIT: Gait pattern: WFL Distance walked: in clinic over 50' Assistive device utilized: None Level of assistance: Complete Independence  FUNCTIONAL TESTS:    PATIENT SURVEYS:  [place] 47/80 or 42% deficit  VESTIBULAR ASSESSMENT:  GENERAL OBSERVATION: guarded movements, frequently closes eyes , slow movements, avoids neck rotation with movement transitions and resting posture   SYMPTOM BEHAVIOR:  Subjective history: 10 yr h.o vertigo, no previous Rx  Non-Vestibular symptoms: neck pain,  nausea/vomiting, and numbness l side of mouth  Type of dizziness: Spinning/Vertigo and Lightheadedness/Faint  Frequency: daily  Duration: a few secs to several mins  Aggravating factors: Induced by position change: rolling to the right, rolling to the left, and supine to sit, Induced by motion: bending down to the ground, turning body quickly, and turning head quickly, Worse in the dark, and Moving eyes  Relieving factors: head stationary and closing eyes  Progression of symptoms: worse  OCULOMOTOR EXAM:  Ocular Alignment: normal  Ocular ROM: No Limitations  Spontaneous Nystagmus: right beating  Gaze-Induced Nystagmus: right beating with right gaze  Smooth Pursuits: saccades  Saccades: hypermetric/overshoots and extra eye movements  Convergence/Divergence:na today    Gaze-Induced Nystagmus: absent   Positional tests: Right Dix-Hallpike: no nystagmus Left Dix-Hallpike: no nystagmus Right Roll Test: symptoms worse on left side Left Roll Test: symptoms worse on left side  VESTIBULAR - OCULAR REFLEX: nt today  POSITIONAL TESTING: Right Dix-Hallpike: no nystagmus Left Dix-Hallpike: no nystagmus Right Roll Test: no nystagmus Left Roll Test: no nystagmus  MOTION SENSITIVITY:  Motion Sensitivity Quotient Intensity: 0 = none, 1 = Lightheaded, 2 = Mild, 3 = Moderate, 4 = Severe, 5 = Vomiting  Intensity  1. Sitting to supine   2. Supine to L side   3. Supine to R side   4. Supine to sitting   5. L Hallpike-Dix   6. Up from L    7. R Hallpike-Dix   8. Up from R    9. Sitting, head tipped to L knee   10. Head up from L knee   11. Sitting, head tipped to R knee   12. Head up from R knee   13. Sitting head turns x5   14.Sitting head nods x5   15. In stance, 180 turn to L    16. In stance, 180 turn to R     OTHOSTATICS: not done  FUNCTIONAL GAIT:  na today   VESTIBULAR TREATMENT:                                                                                                    DATE:   03/14/23 Manual Therapy: to decrease muscle spasm and pain and improve mobility R UPA mobs lumbar spine grade 1-2, STM/TPR  to R lumbar paraspinals, glutes and piriformis, R SIJ, R QL, IASTM with foam roller to R QL, glutes and proximal hamstrings, skilled palpation and monitoring during dry needling. Trigger Point Dry-Needling  Treatment instructions: Expect mild to moderate muscle soreness.  Patient verbalized understanding of these instructions and education. Patient Consent Given: Yes Education handout provided: Previously provided Muscles treated: R piriformis, R glut med Treatment response/outcome: Twitch Response Elicited and Palpable Increase in Muscle Length Modalities: IFC to lumbar spine x 15 min with CP to low back in prone.    03/13/23  Therapeutic Activity:  assessment low back pain Manual Therapy: to decrease muscle spasm and pain and improve mobility R UPA mobs lumbar spine grade 1-2, STM/TPR to R lumbar paraspinals, glutes and piriformis, skilled palpation and monitoring during dry needling. Trigger Point Dry-Needling  Treatment instructions: Expect mild to moderate muscle soreness.  Patient verbalized understanding of these instructions and education. Patient Consent Given: Yes Education handout provided: Previously provided Muscles treated: R piriformis Treatment response/outcome: Twitch Response Elicited and Palpable Increase in Muscle Length Modalities: IFC to lumbar spine x 15 min with CP to low back in prone.     02/26/23 Canalith Repositioning:  Epley Right: Number of Reps: 1 and Response to Treatment: symptoms improved   PATIENT EDUCATION: Education details: apply CP to low back x 15 min at home as needed for pain, contact PCP Person educated: Patient Education method: Explanation Education comprehension: verbalized understanding  HOME EXERCISE PROGRAM:  GOALS: Goals reviewed with patient? Yes  SHORT TERM GOALS: Target date: 03/12/23  Patient  will report compliance with initial HEP.  Baseline:not initiated yet Goal status: IN PROGRESS  2.  Patient will report resolution of dizziness rolling over in bed.  Baseline: consistent dizziness Goal status: IN PROGRESS 03/13/23- improving  3.  Patient will complete further balance testing.  Baseline:  Goal status: IN PROGRESS LONG TERM GOALS: Target date: 04/09/23   1.Patient will be independent with progressed HEP to improve outcomes and carryover.  Baseline: not initiated yet Goal status: IN PROGRESS        2. Patient will demonstrate 26/30 score on FGA to decrease risk of falls.  Baseline: na yet Goal status: IN PROGRESS   Patient will report 56 or greater on DHI to demonstrate improved QOL. Baseline: 47 Goal status: IN PROGRESS  ASSESSMENT:  CLINICAL IMPRESSION: [name] reported significant relief overall from yesterday's treatment but still having pain especially in R QL, SIJ region. Again performed manual therapy followed by Estim and CP, decreasing muscle spasm and pain.  [name] continues to demonstrate potential for improvement and would benefit from continued skilled therapy to address impairments.     OBJECTIVE IMPAIRMENTS: decreased activity tolerance, decreased ROM, dizziness, and postural dysfunction.   ACTIVITY LIMITATIONS: lifting, bending, sleeping, bed mobility, and reach over head  PARTICIPATION LIMITATIONS: cleaning, laundry, and community activity  PERSONAL FACTORS: Time since onset of injury/illness/exacerbation and 1-2 comorbidities: arthritis multiple sites,HTN  are also affecting patient's functional outcome.   REHAB POTENTIAL: Good  CLINICAL DECISION MAKING: Stable/uncomplicated  EVALUATION COMPLEXITY: Low   PLAN:  PT FREQUENCY: 1-2x/week  PT DURATION: 6 weeks  PLANNED INTERVENTIONS: Therapeutic exercises, Therapeutic activity, Neuromuscular re-education, Balance training, Gait training, Patient/Family education, Self  Care, Joint mobilization, and Canalith repositioning  PLAN FOR NEXT SESSION: reassess BPPV, perform VOR tests , further assess cervical spine   Rennie Natter, PT, DPT  03/14/2023, 4:45 PM

## 2023-03-26 ENCOUNTER — Ambulatory Visit: Payer: 59 | Admitting: Physical Therapy

## 2023-03-26 ENCOUNTER — Other Ambulatory Visit: Payer: Self-pay | Admitting: Internal Medicine

## 2023-03-26 ENCOUNTER — Other Ambulatory Visit: Payer: Self-pay | Admitting: Family Medicine

## 2023-03-26 DIAGNOSIS — E039 Hypothyroidism, unspecified: Secondary | ICD-10-CM

## 2023-03-26 DIAGNOSIS — E890 Postprocedural hypothyroidism: Secondary | ICD-10-CM

## 2023-03-26 DIAGNOSIS — E876 Hypokalemia: Secondary | ICD-10-CM

## 2023-03-26 DIAGNOSIS — I10 Essential (primary) hypertension: Secondary | ICD-10-CM

## 2023-04-01 ENCOUNTER — Encounter: Payer: 59 | Admitting: Physical Therapy

## 2023-04-02 ENCOUNTER — Ambulatory Visit: Payer: 59 | Attending: Internal Medicine | Admitting: Internal Medicine

## 2023-04-02 DIAGNOSIS — R079 Chest pain, unspecified: Secondary | ICD-10-CM | POA: Diagnosis not present

## 2023-04-02 DIAGNOSIS — I2510 Atherosclerotic heart disease of native coronary artery without angina pectoris: Secondary | ICD-10-CM | POA: Diagnosis not present

## 2023-04-02 DIAGNOSIS — E876 Hypokalemia: Secondary | ICD-10-CM | POA: Diagnosis not present

## 2023-04-02 DIAGNOSIS — E039 Hypothyroidism, unspecified: Secondary | ICD-10-CM | POA: Diagnosis not present

## 2023-04-02 DIAGNOSIS — I10 Essential (primary) hypertension: Secondary | ICD-10-CM | POA: Diagnosis not present

## 2023-04-02 MED ORDER — NITROGLYCERIN 0.4 MG SL SUBL: 0.4000 mg | SUBLINGUAL_TABLET | SUBLINGUAL | 3 refills | Status: DC | PRN

## 2023-04-02 MED ORDER — CLOPIDOGREL BISULFATE 75 MG PO TABS: 75.0000 mg | ORAL_TABLET | Freq: Every day | ORAL | 3 refills | Status: DC

## 2023-04-02 MED ORDER — HYDRALAZINE HCL 10 MG PO TABS: ORAL_TABLET | ORAL | 6 refills | Status: AC

## 2023-04-02 MED ORDER — ISOSORBIDE MONONITRATE ER 60 MG PO TB24: 60.0000 mg | ORAL_TABLET | Freq: Every day | ORAL | 3 refills | Status: DC

## 2023-04-02 MED ORDER — ROSUVASTATIN CALCIUM 40 MG PO TABS: 40.0000 mg | ORAL_TABLET | Freq: Every day | ORAL | 2 refills | Status: DC

## 2023-04-02 NOTE — Progress Notes (Unsigned)
[name] [address] [place] [address] Phone: [phone] Subjective:   [name], am serving as a scribe for Dr. Antoine Primas.  I'm seeing this patient by the request  of:  Bradd Canary, MD  CC: right ankle pain follow up   YKD:[id number]  02/18/2023 Patient does have some over the right ankle. Discussed with patient about this. Discussed which activities to do and which ones to avoid, increase activity slowly. Follow-up again and we can consider injection.   Significant degenerative disc disease of the lumbar spine with some findings that is suggestive of possible nerve root impingement. Has had multiple epidurals for multiple years by another provider. Do not think it is a terrible way of treating this but patient would like to avoid any type of long-term ramifications of steroids. We did discuss the possibility of medial branch block but patient wants to hold on that for radiofrequency ablation. Would be highly interested in trying the gabapentin. Warned of potential side effects. Hopefully the patient does respond well to this. Discussed home exercises. The patient has done formal physical therapy in the past. Patient will follow-up with me again in 6 to 8 weeks and depending on findings we will discuss thereafter.   Updated 04/03/2023 [name] is a 77 y.o. female coming in with complaint of back pain and R ankle pain. Patient continues to have pain in her ankle. Some days the pain is intense and other days she has less pain.   Pain in back is constant. Feels like PT is helping. Has felt some relief from dry needling.      Past Medical History:  Diagnosis Date   Acute MI, subendocardial (HCC)    Arthritis    Back pain 05/31/2013   Benign paroxysmal positional vertigo 08/05/2016   CAD (coronary artery disease)    a. NSTEMI 10/2011: LHC 11/19/11: pLAD 30%, oOM 60%, AVCFX 30%, CFX after OM2 30%, pRCA 60 and 70%, then 99%, AM  80-90% with TIMI 3 flow.  PCI: Promus DES x 2 to RCA; b. 06/2012 Cath: patent RCA stents w/ subtl occl of Acute Marginal (jailed)->Med rx; c. 05/2015 MV: EF 59%, mod mid infsept/inf/ap lat/ap infarct with peri-infarct isch-->Med Rx; d. 02/2016 Cath: LM nl, LAD 34m, RI 50, RCA patent stents.   Colitis    Facial skin lesion 01/28/2017   GERD (gastroesophageal reflux disease)    occasional   Hyperlipidemia    Hypertension    Hypertensive heart disease    a. Echocardiogram 11/19/11: Difficult acoustic windows, EF 60-65%, normal LV wall thickness, grade 1 diastolic dysfunction   Hypoparathyroidism (HCC)    Low zinc level 11/26/2016   Multinodular thyroid    Goiter s/p thyroidectomy in 2007 with post-op  hypocalcemia and post-op hypothyroidism/hypoparathyroidism with hypocalcemia   Neck pain on right side 07/13/2013   Nocturia 05/31/2013   Osteoarthritis    Pain in right axilla 03/13/2016   Palpitations    a. 03/2012 - patient set up for event monitor but did not wear correctly -  she declined wearing a repeat monitor   Post-surgical hypothyroidism    Sun-damaged skin 12/05/2014   Tubular adenoma of colon 06/2011   Unspecified constipation 05/31/2013   Vertigo    Zinc deficiency 11/26/2015   Past Surgical History:  Procedure Laterality Date   CARDIAC CATHETERIZATION N/A 03/08/2016   Procedure: Left Heart Cath and Coronary Angiography;  Surgeon: Corky Crafts, MD;  Location: [facility] INVASIVE CV  LAB;  Service: Cardiovascular;  Laterality: N/A;   COLONOSCOPY  Aenomatous polyps   07/05/2011   COLONOSCOPY N/A 09/28/2014   Procedure: COLONOSCOPY;  Surgeon: Meryl DareMalcolm T Stark, MD;  Location: [facility] ENDOSCOPY;  Service: Endoscopy;  Laterality: N/A;   CORONARY ANGIOPLASTY WITH STENT PLACEMENT     LEFT HEART CATH AND CORONARY ANGIOGRAPHY N/A 12/09/2018   Procedure: LEFT HEART CATH AND CORONARY ANGIOGRAPHY;  Surgeon: Corky CraftsVaranasi, Jayadeep S, MD;  [facility];  Service: Cardiovascular;  Laterality: N/A;    LEFT HEART CATHETERIZATION WITH CORONARY ANGIOGRAM N/A 07/17/2012   Procedure: LEFT HEART CATHETERIZATION WITH CORONARY ANGIOGRAM;  Surgeon: Herby Abrahamhomas D Stuckey, MD;  [facility];  Service: Cardiovascular;  Laterality: N/A;   PERCUTANEOUS CORONARY STENT INTERVENTION (PCI-S) N/A 11/19/2011   Procedure: PERCUTANEOUS CORONARY STENT INTERVENTION (PCI-S);  Surgeon: Herby Abrahamhomas D Stuckey, MD;  [facility];  Service: Cardiovascular;  Laterality: N/A;   POLYPECTOMY     SHOULDER ARTHROSCOPY W/ ROTATOR CUFF REPAIR     right   TOTAL THYROIDECTOMY  2007   GOITER   Social History   Socioeconomic History   Marital status: Married    Spouse name: Not on file   Number of children: 4   Years of education: 14   Highest education level: Not on file  Occupational History   Occupation: Sales person at [employer]  Tobacco Use   Smoking status: Never   Smokeless tobacco: Never  Vaping Use   Vaping Use: Never used  Substance and Sexual Activity   Alcohol use: Not Currently   Drug use: No   Sexual activity: Not Currently  Other Topics Concern   Not on file  Social History Narrative   Lives at home alone.  Her son lives near her.   Right-handed.   1 cup coffee per day.   Social Determinants of Health   Financial Resource Strain: Low Risk  (05/07/2022)   Overall Financial Resource Strain (CARDIA)    Difficulty of Paying Living Expenses: Not hard at all  Food Insecurity: No Food Insecurity (02/06/2023)   Hunger Vital Sign    Worried About Running Out of Food in the Last Year: Never true    Ran Out of Food in the Last Year: Never true  Transportation Needs: No Transportation Needs (02/06/2023)   PRAPARE - Administrator, Civil ServiceTransportation    Lack of Transportation (Medical): No    Lack of Transportation (Non-Medical): No  Physical Activity: Sufficiently Active (05/07/2022)   Exercise Vital Sign    Days of Exercise per Week: 7 days    Minutes of Exercise per Session: 30 min  Stress: No Stress Concern Present  (05/07/2022)   [employer] - Occupational Stress Questionnaire    Feeling of Stress : Not at all  Social Connections: Not on file   Allergies  Allergen Reactions   Zetia [Ezetimibe] Other (See Comments)    Myalgia, paresthesias and weakness   Penicillin G Rash   Family History  Problem Relation Age of Onset   Colon cancer Brother    Cancer Brother        COLON   Hypertension Father    Heart disease Father    Heart attack Father    Blindness Sister    Congestive Heart Failure Sister    Hypertension Sister    Healthy Daughter    Healthy Son    Thyroid disease Sister    Cancer Brother        multiple myelomas  Healthy Daughter    Healthy Daughter    Diabetes Neg Hx    Prostate cancer Neg Hx    Breast cancer Neg Hx    Esophageal cancer Neg Hx    Rectal cancer Neg Hx    Stomach cancer Neg Hx     Current Outpatient Medications (Endocrine & Metabolic):    calcitRIOL (ROCALTROL) 0.25 MCG capsule, TAKE 3 CAPSULES BY MOUTH DAILY   levothyroxine (SYNTHROID) 125 MCG tablet, TAKE 1 TABLET BY MOUTH DAILY  BEFORE BREAKFAST  Current Outpatient Medications (Cardiovascular):    amLODipine (NORVASC) 10 MG tablet, Take 1 tablet (10 mg total) by mouth daily.   bisoprolol (ZEBETA) 5 MG tablet, Take 1 tablet (5 mg total) by mouth daily.   furosemide (LASIX) 20 MG tablet, 1 tab po daily prn severe pedal edema   hydrALAZINE (APRESOLINE) 10 MG tablet, TAKE 1/2 TABLET (5 MG) BY MOUTH AS NEEDED FOR BP ABOVE 160 OR GREATER   isosorbide mononitrate (IMDUR) 60 MG 24 hr tablet, Take 1 tablet (60 mg total) by mouth daily.   nitroGLYCERIN (NITROSTAT) 0.4 MG SL tablet, Place 1 tablet (0.4 mg total) under the tongue every 5 (five) minutes as needed for chest pain.   REPATHA SURECLICK 140 MG/ML SOAJ, INJECT 140MG  SUBCUTANEOUSLY  EVERY 2 WEEKS   rosuvastatin (CRESTOR) 40 MG tablet, Take 1 tablet (40 mg total) by mouth daily.   Current Outpatient Medications (Analgesics):     aspirin EC 81 MG tablet, Take 81 mg by mouth daily.  Current Outpatient Medications (Hematological):    clopidogrel (PLAVIX) 75 MG tablet, Take 1 tablet (75 mg total) by mouth daily.  Current Outpatient Medications (Other):    B Complex-C (B-COMPLEX WITH VITAMIN C) tablet, Take 1 tablet by mouth daily.   Cholecalciferol (VITAMIN D) 125 MCG (5000 UT) CAPS, Take 5,000 Units by mouth daily in the afternoon.   ciclopirox (PENLAC) 8 % solution, Apply topically at bedtime. Apply over nail and surrounding skin. Apply daily over previous coat. After seven (7) days, may remove with alcohol and continue cycle.   diazepam (VALIUM) 5 MG tablet, Take 1 tablet (5 mg total) by mouth daily as needed for anxiety (MRI). Take 1 one hour prior to MRI and another when arrive   gabapentin (NEURONTIN) 100 MG capsule, Take 2 capsules (200 mg total) by mouth at bedtime.   Multiple Vitamin (MULTIVITAMIN WITH MINERALS) TABS tablet, Take 1 tablet by mouth daily.   pantoprazole (PROTONIX) 40 MG tablet, TAKE 1 TABLET BY MOUTH  DAILY   urea (CARMOL) 10 % cream, Apply topically as needed.   Reviewed prior external information including notes and imaging from  primary care provider As well as notes that were available from care everywhere and other healthcare systems.  Past medical history, social, surgical and family history all reviewed in electronic medical record.  No pertanent information unless stated regarding to the chief complaint.   Review of Systems:  No headache, visual changes, nausea, vomiting, diarrhea, constipation, dizziness, abdominal pain, skin rash, fevers, chills, night sweats, weight loss, swollen lymph nodes, body aches, joint swelling, chest pain, shortness of breath, mood changes. POSITIVE muscle aches  Objective  Blood pressure 122/62, pulse 64, height 5\' 5"  (1.651 m), weight 190 lb (86.2 kg), SpO2 98 %.   General: No apparent distress alert and oriented x3 mood and affect normal, dressed  appropriately.  HEENT: Pupils equal, extraocular movements intact  Respiratory: Patient's speak in full sentences and does not appear short of breath  Cardiovascular: No lower extremity edema, non tender, no erythema  Back exam does have some loss of lordosis noted. Ankles do have some limited range of motion noted bilaterally.  Patient does have positive sinus tarsi with Tinel's.  Patient does have some cyst formation noted in the area.  Procedure: Real-time Ultrasound Guided Injection of right sinus tarsi Device: GE Logiq Q7 Ultrasound guided injection is preferred based studies that show increased duration, increased effect, greater accuracy, decreased procedural pain, increased response rate, and decreased cost with ultrasound guided versus blind injection.  Verbal informed consent obtained.  Time-out conducted.  Noted no overlying erythema, induration, or other signs of local infection.  Skin prepped in a sterile fashion.  Local anesthesia: Topical Ethyl chloride.  With sterile technique and under real time ultrasound guidance: With a 25-gauge half inch needle injected with 0.5 cc of 0.5% Marcaine and 0.5 cc of Kenalog 40 mg/mL Completed without difficulty  Pain immediately resolved suggesting accurate placement of the medication.  Advised to call if fevers/chills, erythema, induration, drainage, or persistent bleeding.  Impression: Technically successful ultrasound guided injection.  Procedure: Real-time Ultrasound Guided Injection of left sinus tarsi Device: GE Logiq Q7 Ultrasound guided injection is preferred based studies that show increased duration, increased effect, greater accuracy, decreased procedural pain, increased response rate, and decreased cost with ultrasound guided versus blind injection.  Verbal informed consent obtained.  Time-out conducted.  Noted no overlying erythema, induration, or other signs of local infection.  Skin prepped in a sterile fashion.  Local  anesthesia: Topical Ethyl chloride.  With sterile technique and under real time ultrasound guidance: With a 25-gauge half Injected with 0.5 cc of 0.5% Marcaine and 0.5 cc of Kenalog 40 mg/mL Completed without difficulty  Pain immediately resolved suggesting accurate placement of the medication.  Advised to call if fevers/chills, erythema, induration, drainage, or persistent bleeding.  Impression: Technically successful ultrasound guided injection.    Impression and Recommendations:      The above documentation has been reviewed and is accurate and complete Judi Saa, DO

## 2023-04-02 NOTE — Patient Instructions (Signed)
Medication Instructions:  TAKE HYDRALAZINE 10 MG... 1/2 (5 MG) FOR BP ABOVE 160 *If you need a refill on your cardiac medications before your next appointment, please call your pharmacy*   Lab Work: BMET AND URIC ACID LEVEL  If you have labs (blood work) drawn today and your tests are completely normal, you will receive your results only by: MyChart Message (if you have MyChart) OR A paper copy in the mail If you have any lab test that is abnormal or we need to change your treatment, we will call you to review the results.   Testing/Procedures:    Follow-Up: At [facility], you and your health needs are our priority.  As part of our continuing mission to provide you with exceptional heart care, we have created designated Provider Care Teams.  These Care Teams include your primary Cardiologist (physician) and Advanced Practice Providers (APPs -  Physician Assistants and Nurse Practitioners) who all work together to provide you with the care you need, when you need it.  We recommend signing up for the patient portal called "MyChart".  Sign up information is provided on this After Visit Summary.  MyChart is used to connect with patients for Virtual Visits (Telemedicine).  Patients are able to view lab/test results, encounter notes, upcoming appointments, etc.  Non-urgent messages can be sent to your provider as well.   To learn more about what you can do with MyChart, go to [email or web address].    Your next appointment:   4 month(s) END OF AUGUST   Provider:   Dietrich Pates, MD     Other Instructions

## 2023-04-02 NOTE — Progress Notes (Unsigned)
Cardiology Office Note   Date:  04/02/2023   ID:  [name], DOB 03-30-1946, MRN [id number]  PCP:  Bradd Canary, MD  Cardiologist:   Dietrich Pates, MD   F/U of CAD    History of Present Illness:  [name] is a 77 y.o. female with CAD - s/p NSTEMI 10/2011 with DESx2  Also has a hx of  GERD, HTN, HLD, vertigo.  L heart cath in Dec 2019 showed:  LAD with 25%  OM  with 100% with Left to R  collaterals  RCA with patent stent   Normal LVEF    Echo in early Sept 2020   LVEF and RVEF were normal   Mild diastolic dysfunction      In 2023 the pt had some numbness on L face   Seen by  neuro    ? TIA  Echo done   Normal   Carotid USN without severe dz   Symptoms felt to be due to DJD of neck       Pt was seen in clnic in Oct 2023  Since seen she has occsaional CP    Non change   She has not had any more neurologic symtpoms    Breathing is OK She is having a lot of problems with her back   Went to PT  Still in pain  I saw the pt in Jan 2024   No outpatient medications have been marked as taking for the 04/02/23 encounter (Appointment) with Pricilla Riffle, MD.     Allergies:   Zetia [ezetimibe] and Penicillin g   Past Medical History:  Diagnosis Date   Acute MI, subendocardial (HCC)    Arthritis    Back pain 05/31/2013   Benign paroxysmal positional vertigo 08/05/2016   CAD (coronary artery disease)    a. NSTEMI 10/2011: LHC 11/19/11: pLAD 30%, oOM 60%, AVCFX 30%, CFX after OM2 30%, pRCA 60 and 70%, then 99%, AM 80-90% with TIMI 3 flow.  PCI: Promus DES x 2 to RCA; b. 06/2012 Cath: patent RCA stents w/ subtl occl of Acute Marginal (jailed)->Med rx; c. 05/2015 MV: EF 59%, mod mid infsept/inf/ap lat/ap infarct with peri-infarct isch-->Med Rx; d. 02/2016 Cath: LM nl, LAD 8m, RI 50, RCA patent stents.   Colitis    Facial skin lesion 01/28/2017   GERD (gastroesophageal reflux disease)    occasional   Hyperlipidemia    Hypertension    Hypertensive heart disease    a. Echocardiogram  11/19/11: Difficult acoustic windows, EF 60-65%, normal LV wall thickness, grade 1 diastolic dysfunction   Hypoparathyroidism (HCC)    Low zinc level 11/26/2016   Multinodular thyroid    Goiter s/p thyroidectomy in 2007 with post-op  hypocalcemia and post-op hypothyroidism/hypoparathyroidism with hypocalcemia   Neck pain on right side 07/13/2013   Nocturia 05/31/2013   Osteoarthritis    Pain in right axilla 03/13/2016   Palpitations    a. 03/2012 - patient set up for event monitor but did not wear correctly -  she declined wearing a repeat monitor   Post-surgical hypothyroidism    Sun-damaged skin 12/05/2014   Tubular adenoma of colon 06/2011   Unspecified constipation 05/31/2013   Vertigo    Zinc deficiency 11/26/2015    Past Surgical History:  Procedure Laterality Date   CARDIAC CATHETERIZATION N/A 03/08/2016   Procedure: Left Heart Cath and Coronary Angiography;  Surgeon: Corky Crafts, MD;  Location: [facility] INVASIVE CV LAB;  Service: Cardiovascular;  Laterality: N/A;   COLONOSCOPY  Aenomatous polyps   07/05/2011   COLONOSCOPY N/A 09/28/2014   Procedure: COLONOSCOPY;  Surgeon: Meryl DareMalcolm T Stark, MD;  Location: [facility] ENDOSCOPY;  Service: Endoscopy;  Laterality: N/A;   CORONARY ANGIOPLASTY WITH STENT PLACEMENT     LEFT HEART CATH AND CORONARY ANGIOGRAPHY N/A 12/09/2018   Procedure: LEFT HEART CATH AND CORONARY ANGIOGRAPHY;  Surgeon: Corky CraftsVaranasi, Jayadeep S, MD;  [facility];  Service: Cardiovascular;  Laterality: N/A;   LEFT HEART CATHETERIZATION WITH CORONARY ANGIOGRAM N/A 07/17/2012   Procedure: LEFT HEART CATHETERIZATION WITH CORONARY ANGIOGRAM;  Surgeon: Herby Abrahamhomas D Stuckey, MD;  [facility];  Service: Cardiovascular;  Laterality: N/A;   PERCUTANEOUS CORONARY STENT INTERVENTION (PCI-S) N/A 11/19/2011   Procedure: PERCUTANEOUS CORONARY STENT INTERVENTION (PCI-S);  Surgeon: Herby Abrahamhomas D Stuckey, MD;  [facility];  Service: Cardiovascular;  Laterality: N/A;    POLYPECTOMY     SHOULDER ARTHROSCOPY W/ ROTATOR CUFF REPAIR     right   TOTAL THYROIDECTOMY  2007   GOITER     Social History:  The patient  reports that she has never smoked. She has never used smokeless tobacco. She reports that she does not currently use alcohol. She reports that she does not use drugs.   Family History:  The patient's family history includes Blindness in her sister; Cancer in her brother and brother; Colon cancer in her brother; Congestive Heart Failure in her sister; Healthy in her daughter, daughter, daughter, and son; Heart attack in her father; Heart disease in her father; Hypertension in her father and sister; Thyroid disease in her sister.    ROS:  Please see the history of present illness. All other systems are reviewed and  Negative to the above problem except as noted.    PHYSICAL EXAM: VS:  There were no vitals taken for this visit.  GEN: Well nourished, well developed, in no acute distress  HEENT: normal  Neck: JVP is not elevated   No bruits Cardiac: RRR; no murmurs,   No LE  edema  Respiratory:  clear to auscultation bilaterally GI: soft, nontender, nondistended, + BS   MS: no deformity Moving all extremities   Skin: warm and dry Neuro: CN II to XII intact Psych: euthymic mood, full affect   EKG:  EKG is not ordered today   Echo   Sept 2023   1. Left ventricular ejection fraction, by estimation, is 60 to 65%. The  left ventricle has normal function. The left ventricle has no regional  wall motion abnormalities. There is mild left ventricular hypertrophy.  Left ventricular diastolic parameters  were normal.   2. Right ventricular systolic function is normal. The right ventricular  size is normal.   3. The mitral valve is abnormal. Trivial mitral valve regurgitation. No  evidence of mitral stenosis.   4. The aortic valve is tricuspid. Aortic valve regurgitation is trivial.  No aortic stenosis is present.   5. The inferior vena cava is normal  in size with greater than 50%  respiratory variability, suggesting right atrial pressure of 3 mmHg.    Lipid Panel    Component Value Date/Time   CHOL 200 02/05/2023 2259   CHOL 172 10/11/2022 1050   TRIG 178 (H) 02/05/2023 2259   HDL 52 02/05/2023 2259   HDL 43 10/11/2022 1050   CHOLHDL 3.8 02/05/2023 2259   VLDL 36 02/05/2023 2259   LDLCALC 112 (H) 02/05/2023 2259   LDLCALC 101 (H) 10/11/2022 1050  LDLDIRECT 96.0 11/06/2022 1623      Wt Readings from Last 3 Encounters:  02/18/23 190 lb (86.2 kg)  02/11/23 190 lb (86.2 kg)  02/05/23 190 lb (86.2 kg)      ASSESSMENT AND PLAN:  1  CAD  No symptoms of significant angina   Follow      2   HTN   BP is OK    Contineu current regimen     3  HL   HL   Repeat lipomed   Pt on Repatha and Crestor      Follow up later this spring    Current medicines are reviewed at length with the patient today.  The patient does not have concerns regarding medicines.  Signed, Dietrich Pates, MD  04/02/2023 5:11 AM    [facility] [address] [place], [place], [place]  [address] Phone: [phone]; Fax: [phone]

## 2023-04-03 ENCOUNTER — Encounter: Payer: Self-pay | Admitting: Family Medicine

## 2023-04-03 ENCOUNTER — Ambulatory Visit (INDEPENDENT_AMBULATORY_CARE_PROVIDER_SITE_OTHER): Payer: 59 | Admitting: Family Medicine

## 2023-04-03 ENCOUNTER — Other Ambulatory Visit: Payer: Self-pay

## 2023-04-03 VITALS — BP 122/62 | HR 64 | Ht 65.0 in | Wt 190.0 lb

## 2023-04-03 DIAGNOSIS — M25571 Pain in right ankle and joints of right foot: Secondary | ICD-10-CM | POA: Diagnosis not present

## 2023-04-03 DIAGNOSIS — M25572 Pain in left ankle and joints of left foot: Secondary | ICD-10-CM | POA: Diagnosis not present

## 2023-04-03 DIAGNOSIS — G8929 Other chronic pain: Secondary | ICD-10-CM

## 2023-04-03 LAB — BASIC METABOLIC PANEL
BUN/Creatinine Ratio: 20 (ref 12–28)
BUN: 22 mg/dL (ref 8–27)
CO2: 27 mmol/L (ref 20–29)
Calcium: 8.6 mg/dL — ABNORMAL LOW (ref 8.7–10.3)
Chloride: 103 mmol/L (ref 96–106)
Creatinine, Ser: 1.1 mg/dL — ABNORMAL HIGH (ref 0.57–1.00)
Glucose: 89 mg/dL (ref 70–99)
Potassium: 4.9 mmol/L (ref 3.5–5.2)
Sodium: 145 mmol/L — ABNORMAL HIGH (ref 134–144)
eGFR: 52 mL/min/{1.73_m2} — ABNORMAL LOW (ref >59)

## 2023-04-03 LAB — URIC ACID: Uric Acid: 6.2 mg/dL (ref 3.1–7.9)

## 2023-04-03 NOTE — Assessment & Plan Note (Signed)
Patient given injection as well.  We discussed with patient and some of the lipoma of the sinus tarsi secondary to the change in her cosmetic.  Hopefully will decrease size somewhat difficult with the underlying pain as well.  still concerned that some of this could be secondary to the back pain and neuropathy as well.  discussed proper shoes, icing regimen, home exercises.  follow-up again in 6 to 8 weeks.  history of peripheral artery disease and will need to be continuing to monitor as well.

## 2023-04-03 NOTE — Assessment & Plan Note (Signed)
Mood seems to have more of a sinus tarsi syndrome with mild lipoma noted over the area.  Discussed icing regimen, discussed compression, worsening pain can consider the possibility of surgical intervention but if not getting pain with moderate continuing.  Follow-up again with me in 2 to 3 months

## 2023-04-03 NOTE — Patient Instructions (Addendum)
Good to see you Injection given in both feet today  Give it a little time See me in 2-3 months

## 2023-04-09 ENCOUNTER — Other Ambulatory Visit: Payer: Self-pay

## 2023-04-09 ENCOUNTER — Ambulatory Visit: Payer: 59 | Attending: Family Medicine

## 2023-04-09 DIAGNOSIS — M5441 Lumbago with sciatica, right side: Secondary | ICD-10-CM | POA: Insufficient documentation

## 2023-04-09 DIAGNOSIS — H8113 Benign paroxysmal vertigo, bilateral: Secondary | ICD-10-CM | POA: Diagnosis not present

## 2023-04-09 DIAGNOSIS — I87391 Chronic venous hypertension (idiopathic) with other complications of right lower extremity: Secondary | ICD-10-CM | POA: Diagnosis not present

## 2023-04-09 DIAGNOSIS — G8929 Other chronic pain: Secondary | ICD-10-CM | POA: Diagnosis not present

## 2023-04-09 DIAGNOSIS — R42 Dizziness and giddiness: Secondary | ICD-10-CM | POA: Diagnosis not present

## 2023-04-09 DIAGNOSIS — R252 Cramp and spasm: Secondary | ICD-10-CM | POA: Diagnosis not present

## 2023-04-09 DIAGNOSIS — R293 Abnormal posture: Secondary | ICD-10-CM | POA: Diagnosis not present

## 2023-04-09 DIAGNOSIS — M542 Cervicalgia: Secondary | ICD-10-CM | POA: Diagnosis not present

## 2023-04-09 DIAGNOSIS — I83891 Varicose veins of right lower extremities with other complications: Secondary | ICD-10-CM | POA: Diagnosis not present

## 2023-04-09 NOTE — Therapy (Signed)
OUTPATIENT PHYSICAL THERAPY Treatment      Patient Name: [name] MRN: [id number] DOB:1946-10-30, 77 y.o., female Today's Date: 04/09/2023  END OF SESSION:  PT End of Session - 04/09/23 1803     Visit Number 25    Number of Visits 27    Date for PT Re-Evaluation 04/09/23    Authorization Type [employer] Medicare    PT Start Time 1420    PT Stop Time 1445    PT Time Calculation (min) 25 min    Activity Tolerance Patient tolerated treatment well    Behavior During Therapy [facility] for tasks assessed/performed             Past Medical History:  Diagnosis Date   Acute MI, subendocardial    Arthritis    Back pain 05/31/2013   Benign paroxysmal positional vertigo 08/05/2016   CAD (coronary artery disease)    a. NSTEMI 10/2011: LHC 11/19/11: pLAD 30%, oOM 60%, AVCFX 30%, CFX after OM2 30%, pRCA 60 and 70%, then 99%, AM 80-90% with TIMI 3 flow.  PCI: Promus DES x 2 to RCA; b. 06/2012 Cath: patent RCA stents w/ subtl occl of Acute Marginal (jailed)->Med rx; c. 05/2015 MV: EF 59%, mod mid infsept/inf/ap lat/ap infarct with peri-infarct isch-->Med Rx; d. 02/2016 Cath: LM nl, LAD 8m, RI 50, RCA patent stents.   Colitis    Facial skin lesion 01/28/2017   GERD (gastroesophageal reflux disease)    occasional   Hyperlipidemia    Hypertension    Hypertensive heart disease    a. Echocardiogram 11/19/11: Difficult acoustic windows, EF 60-65%, normal LV wall thickness, grade 1 diastolic dysfunction   Hypoparathyroidism    Low zinc level 11/26/2016   Multinodular thyroid    Goiter s/p thyroidectomy in 2007 with post-op  hypocalcemia and post-op hypothyroidism/hypoparathyroidism with hypocalcemia   Neck pain on right side 07/13/2013   Nocturia 05/31/2013   Osteoarthritis    Pain in right axilla 03/13/2016   Palpitations    a. 03/2012 - patient set up for event monitor but did not wear correctly -  she declined wearing a repeat monitor   Post-surgical hypothyroidism    Sun-damaged skin 12/05/2014    Tubular adenoma of colon 06/2011   Unspecified constipation 05/31/2013   Vertigo    Zinc deficiency 11/26/2015   Past Surgical History:  Procedure Laterality Date   CARDIAC CATHETERIZATION N/A 03/08/2016   Procedure: Left Heart Cath and Coronary Angiography;  Surgeon: Corky Crafts, MD;  Location: [facility] INVASIVE CV LAB;  Service: Cardiovascular;  Laterality: N/A;   COLONOSCOPY  Aenomatous polyps   07/05/2011   COLONOSCOPY N/A 09/28/2014   Procedure: COLONOSCOPY;  Surgeon: Meryl Dare, MD;  Location: [facility] ENDOSCOPY;  Service: Endoscopy;  Laterality: N/A;   CORONARY ANGIOPLASTY WITH STENT PLACEMENT     LEFT HEART CATH AND CORONARY ANGIOGRAPHY N/A 12/09/2018   Procedure: LEFT HEART CATH AND CORONARY ANGIOGRAPHY;  Surgeon: Corky Crafts, MD;  Location: [facility] INVASIVE CV LAB;  Service: Cardiovascular;  Laterality: N/A;   LEFT HEART CATHETERIZATION WITH CORONARY ANGIOGRAM N/A 07/17/2012   Procedure: LEFT HEART CATHETERIZATION WITH CORONARY ANGIOGRAM;  Surgeon: Herby Abraham, MD;  Location[facility] CATH LAB;  Service: Cardiovascular;  Laterality: N/A;   PERCUTANEOUS CORONARY STENT INTERVENTION (PCI-S) N/A 11/19/2011   Procedure: PERCUTANEOUS CORONARY STENT INTERVENTION (PCI-S);  Surgeon: Herby Abraham, MD;  Location: [facility] CATH LAB;  Service: Cardiovascular;  Laterality: N/A;   POLYPECTOMY     SHOULDER ARTHROSCOPY W/ ROTATOR CUFF  REPAIR     right   TOTAL THYROIDECTOMY  2007   GOITER   Patient Active Problem List   Diagnosis Date Noted   Sinus tarsi syndrome of left ankle 04/03/2023   Sinus tarsi syndrome of right ankle 02/18/2023   Edema 02/17/2023   Acute prerenal azotemia 02/06/2023   TIA (transient ischemic attack) 02/05/2023   Paresthesia 08/28/2022   Chronic neck pain 08/28/2022   Neuropathy 08/21/2022   Vitamin deficiency 10/23/2021   Hypoglycemia 10/07/2019   Heart disease 01/29/2019   Sensorineural hearing loss (SNHL) of both ears 01/27/2019   Primary osteoarthritis of both  knees 12/19/2018   DDD (degenerative disc disease), lumbar 12/19/2018   ANA positive 10/27/2018   Anxiety 02/17/2018   Peripheral arterial disease 02/20/2017   Skin lesion of face 01/28/2017   Benign paroxysmal positional vertigo 08/05/2016   Antiplatelet or antithrombotic long-term use 03/27/2016   Right lumbar radiculopathy 02/21/2016   Abnormality of gait 02/21/2016   Zinc deficiency 11/26/2015   Right hip pain 10/26/2015   Medicare annual wellness visit, subsequent 03/13/2015   Preventative health care 03/13/2015   Sun-damaged skin 12/05/2014   Angina of effort 12/02/2014   Hx of colonic polyps 09/28/2014   Benign neoplasm of descending colon 09/28/2014   Other fatigue 09/10/2014   Personal history of colonic polyps 07/16/2014   Bilateral thumb pain 03/20/2014   Postsurgical hypoparathyroidism 08/03/2013   Neck pain on right side 07/13/2013   Constipation 05/31/2013   Back pain 05/31/2013   Nocturia 05/31/2013   GERD (gastroesophageal reflux disease) 11/02/2012   Tension headache 11/02/2012   Anemia 06/11/2012   Hypokalemia 06/11/2012   Depression 01/21/2012   Headache(784.0) 12/23/2011   Sinus bradycardia 12/07/2011   Hypocalcemia 12/07/2011   Acute MI, subendocardial 11/20/2011   Acquired hypothyroidism 05/20/2009   Essential hypertension, benign 05/20/2009   Hyperlipidemia 03/08/2009   CAD, NATIVE VESSEL 03/08/2009    PCP: Danise Edge, MD REFERRING PROVIDER: Danise Edge, MD  REFERRING DIAG: BPPV  THERAPY DIAG:  Dizziness and giddiness  Cervicalgia  Chronic right-sided low back pain with right-sided sciatica  BPPV (benign paroxysmal positional vertigo), bilateral  Abnormal posture  ONSET DATE: 2014  Rationale for Evaluation and Treatment: Rehabilitation  SUBJECTIVE:   SUBJECTIVE STATEMENT: I was able to sleep on my R side last night, much better, but it still hurts here on my right side.   Pt accompanied by: friend  PERTINENT HISTORY: h/o  chronic vertigo, referred to PT by PCP to assess for BPPV  PAIN:  Are you having pain? Yes: NPRS scale: 7/10 Pain location: right side lower back  Pain description: tender to touch Aggravating factors: twisting, bending Relieving factors: nothing  PRECAUTIONS: None  WEIGHT BEARING RESTRICTIONS: No  FALLS: Has patient fallen in last 6 months? No  LIVING ENVIRONMENT: Lives with: lives alone Lives in: House/apartment Stairs: No Has following equipment at home: None  PLOF: Independent  PATIENT GOALS: get rid of dizziness, nausea  OBJECTIVE:   DIAGNOSTIC FINDINGS: MRI at hospital recently brain and CT due to TIA/ stroke Sx , and both with negative results  COGNITION: Overall cognitive status: Within functional limits for tasks assessed   SENSATION: WFL  EDEMA:  None noted   POSTURE:  rounded shoulders, forward head, and increased thoracic kyphosis  Cervical ROM:    Active A/PROM (deg) eval  Flexion 70%  Extension 50%  Right lateral flexion   Left lateral flexion   Right rotation 50%  Left rotation 40%  (Blank rows =  not tested)  STRENGTH: L thumb extension 3+/5, all other MMT elbow and distally wnl, shoulders not assessed, currently under care for R shoulder pain/ injury  LOWER EXTREMITY MMT: grossly wnl  BED MOBILITY:  Sit to supine Modified independence Supine to sit Modified independence Rolling to Right Modified independence Rolling to Left Modified independence  TRANSFERS:all wnl RAMP:  na  CURB:  na  GAIT: Gait pattern: WFL Distance walked: in clinic over 50' Assistive device utilized: None Level of assistance: Complete Independence  FUNCTIONAL TESTS:    PATIENT SURVEYS:  DHI 47/80 or 42% deficit  VESTIBULAR ASSESSMENT:  GENERAL OBSERVATION: guarded movements, frequently closes eyes , slow movements, avoids neck rotation with movement transitions and resting posture   SYMPTOM BEHAVIOR:  Subjective history: 10 yr h.o vertigo, no  previous Rx  Non-Vestibular symptoms: neck pain, nausea/vomiting, and numbness l side of mouth  Type of dizziness: Spinning/Vertigo and Lightheadedness/Faint  Frequency: daily  Duration: a few secs to several mins  Aggravating factors: Induced by position change: rolling to the right, rolling to the left, and supine to sit, Induced by motion: bending down to the ground, turning body quickly, and turning head quickly, Worse in the dark, and Moving eyes  Relieving factors: head stationary and closing eyes  Progression of symptoms: worse  OCULOMOTOR EXAM:  Ocular Alignment: normal  Ocular ROM: No Limitations  Spontaneous Nystagmus: right beating  Gaze-Induced Nystagmus: right beating with right gaze  Smooth Pursuits: saccades  Saccades: hypermetric/overshoots and extra eye movements  Convergence/Divergence:na today    Gaze-Induced Nystagmus: absent   Positional tests: Right Dix-Hallpike: no nystagmus Left Dix-Hallpike: no nystagmus Right Roll Test: symptoms worse on left side Left Roll Test: symptoms worse on left side  VESTIBULAR - OCULAR REFLEX: nt today  POSITIONAL TESTING: Right Dix-Hallpike: no nystagmus Left Dix-Hallpike: no nystagmus Right Roll Test: no nystagmus Left Roll Test: no nystagmus  MOTION SENSITIVITY:  Motion Sensitivity Quotient Intensity: 0 = none, 1 = Lightheaded, 2 = Mild, 3 = Moderate, 4 = Severe, 5 = Vomiting  Intensity  1. Sitting to supine   2. Supine to L side   3. Supine to R side   4. Supine to sitting   5. L Hallpike-Dix   6. Up from L    7. R Hallpike-Dix   8. Up from R    9. Sitting, head tipped to L knee   10. Head up from L knee   11. Sitting, head tipped to R knee   12. Head up from R knee   13. Sitting head turns x5   14.Sitting head nods x5   15. In stance, 180 turn to L    16. In stance, 180 turn to R     OTHOSTATICS: not done  FUNCTIONAL GAIT:  na today   VESTIBULAR TREATMENT:                                                                                                    DATE:  04/09/23: Manual: to decrease myofascial pain: Prone for Trigger Point Dry-Needling  Treatment instructions: Expect mild  to moderate muscle soreness. S/S of pneumothorax if dry needled over a lung field, and to seek immediate medical attention should they occur. Patient verbalized understanding of these instructions and education. Patient Consent Given: Yes Education handout provided: Previously provided Muscles treated: R piriformis, R glut max, R glut med, R glut min Electrical stimulation performed: No Parameters: N/A Treatment response/outcome: Twitch Response Elicited and Palpable Increase in Muscle Length   After dry needling, therapist assisted with several manual stretches for lumbar region, B knee to chest, alternating piriformis stretch, resisted isometric hip and knee extension, alternating, 3 sets 5 sec holds, hamstring stretch B  03/14/23 Manual Therapy: to decrease muscle spasm and pain and improve mobility R UPA mobs lumbar spine grade 1-2, STM/TPR to R lumbar paraspinals, glutes and piriformis, R SIJ, R QL, IASTM with foam roller to R QL, glutes and proximal hamstrings, skilled palpation and monitoring during dry needling. Trigger Point Dry-Needling  Treatment instructions: Expect mild to moderate muscle soreness.  Patient verbalized understanding of these instructions and education. Patient Consent Given: Yes Education handout provided: Previously provided Muscles treated: R piriformis, R glut med Treatment response/outcome: Twitch Response Elicited and Palpable Increase in Muscle Length Modalities: IFC to lumbar spine x 15 min with CP to low back in prone.    03/13/23  Therapeutic Activity:  assessment low back pain Manual Therapy: to decrease muscle spasm and pain and improve mobility R UPA mobs lumbar spine grade 1-2, STM/TPR to R lumbar paraspinals, glutes and piriformis, skilled palpation and monitoring during dry  needling. Trigger Point Dry-Needling  Treatment instructions: Expect mild to moderate muscle soreness.  Patient verbalized understanding of these instructions and education. Patient Consent Given: Yes Education handout provided: Previously provided Muscles treated: R piriformis Treatment response/outcome: Twitch Response Elicited and Palpable Increase in Muscle Length Modalities: IFC to lumbar spine x 15 min with CP to low back in prone.     02/26/23 Canalith Repositioning:  Epley Right: Number of Reps: 1 and Response to Treatment: symptoms improved   PATIENT EDUCATION: Education details: apply CP to low back x 15 min at home as needed for pain, contact PCP Person educated: Patient Education method: Explanation Education comprehension: verbalized understanding  HOME EXERCISE PROGRAM:  GOALS: Goals reviewed with patient? Yes  SHORT TERM GOALS: Target date: 03/12/23  Patient will report compliance with initial HEP.  Baseline:not initiated yet Goal status: IN PROGRESS  2.  Patient will report resolution of dizziness rolling over in bed.  Baseline: consistent dizziness Goal status: IN PROGRESS 03/13/23- improving  3.  Patient will complete further balance testing.  Baseline:  Goal status: IN PROGRESS LONG TERM GOALS: Target date: 04/09/23   1.Patient will be independent with progressed HEP to improve outcomes and carryover.  Baseline: not initiated yet Goal status: IN PROGRESS        2. Patient will demonstrate 26/30 score on FGA to decrease risk of falls.  Baseline: na yet Goal status: IN PROGRESS   Patient will report 56 or greater on DHI to demonstrate improved QOL. Baseline: 47 Goal status: IN PROGRESS  ASSESSMENT:  CLINICAL IMPRESSION: Safiyya T Modi c/o R sided lower back and post hip pain after her 3 week trip to and from [place].recently.  she arrived 20 min late so abbreviated session today.  Again addressed her lumbosacral spine pain, not vestibular.  She  did report some vestibular Sx immediately following her flight to and from [facility] for about 24 hrs.  Sherial T Nimmons is going to continue,  will reassess next visit for vestibular if needed or LS spine pain      OBJECTIVE IMPAIRMENTS: decreased activity tolerance, decreased ROM, dizziness, and postural dysfunction.   ACTIVITY LIMITATIONS: lifting, bending, sleeping, bed mobility, and reach over head  PARTICIPATION LIMITATIONS: cleaning, laundry, and community activity  PERSONAL FACTORS: Time since onset of injury/illness/exacerbation and 1-2 comorbidities: arthritis multiple sites,HTN  are also affecting patient's functional outcome.   REHAB POTENTIAL: Good  CLINICAL DECISION MAKING: Stable/uncomplicated  EVALUATION COMPLEXITY: Low   PLAN:  PT FREQUENCY: 1-2x/week  PT DURATION: 6 weeks  PLANNED INTERVENTIONS: Therapeutic exercises, Therapeutic activity, Neuromuscular re-education, Balance training, Gait training, Patient/Family education, Self Care, Joint mobilization, and Canalith repositioning  PLAN FOR NEXT SESSION: reassess BPPV, perform VOR tests , further assess cervical spine   Deep Bonawitz L Kirstein Baxley, PT, DPT  04/09/2023, 6:17 PM

## 2023-04-17 ENCOUNTER — Ambulatory Visit: Payer: 59 | Admitting: Physical Therapy

## 2023-04-17 ENCOUNTER — Other Ambulatory Visit: Payer: Self-pay | Admitting: Internal Medicine

## 2023-04-17 DIAGNOSIS — R079 Chest pain, unspecified: Secondary | ICD-10-CM

## 2023-04-18 ENCOUNTER — Other Ambulatory Visit: Payer: Self-pay

## 2023-04-18 ENCOUNTER — Ambulatory Visit: Payer: 59

## 2023-04-18 DIAGNOSIS — H8113 Benign paroxysmal vertigo, bilateral: Secondary | ICD-10-CM

## 2023-04-18 DIAGNOSIS — G8929 Other chronic pain: Secondary | ICD-10-CM | POA: Diagnosis not present

## 2023-04-18 DIAGNOSIS — R293 Abnormal posture: Secondary | ICD-10-CM

## 2023-04-18 DIAGNOSIS — R252 Cramp and spasm: Secondary | ICD-10-CM

## 2023-04-18 DIAGNOSIS — R42 Dizziness and giddiness: Secondary | ICD-10-CM

## 2023-04-18 DIAGNOSIS — M542 Cervicalgia: Secondary | ICD-10-CM | POA: Diagnosis not present

## 2023-04-18 DIAGNOSIS — M5441 Lumbago with sciatica, right side: Secondary | ICD-10-CM | POA: Diagnosis not present

## 2023-04-18 NOTE — Therapy (Signed)
OUTPATIENT PHYSICAL THERAPY PROGRESS REPORT    Progress Note Reporting Period 11/06/22 to 04/18/23  See note below for Objective Data and Assessment of Progress/Goals.      Patient Name: [name] MRN: [id number] DOB:13-Mar-1946, 77 y.o., female Today's Date: 04/18/2023  END OF SESSION:  PT End of Session - 04/18/23 1745     Visit Number 26    Date for PT Re-Evaluation 06/13/23    Authorization Type [employer] Medicare    Progress Note Due on Visit 35    PT Start Time 1400    PT Stop Time 1445    PT Time Calculation (min) 45 min              Past Medical History:  Diagnosis Date   Acute MI, subendocardial (HCC)    Arthritis    Back pain 05/31/2013   Benign paroxysmal positional vertigo 08/05/2016   CAD (coronary artery disease)    a. NSTEMI 10/2011: LHC 11/19/11: pLAD 30%, oOM 60%, AVCFX 30%, CFX after OM2 30%, pRCA 60 and 70%, then 99%, AM 80-90% with TIMI 3 flow.  PCI: Promus DES x 2 to RCA; b. 06/2012 Cath: patent RCA stents w/ subtl occl of Acute Marginal (jailed)->Med rx; c. 05/2015 MV: EF 59%, mod mid infsept/inf/ap lat/ap infarct with peri-infarct isch-->Med Rx; d. 02/2016 Cath: LM nl, LAD 22m, RI 50, RCA patent stents.   Colitis    Facial skin lesion 01/28/2017   GERD (gastroesophageal reflux disease)    occasional   Hyperlipidemia    Hypertension    Hypertensive heart disease    a. Echocardiogram 11/19/11: Difficult acoustic windows, EF 60-65%, normal LV wall thickness, grade 1 diastolic dysfunction   Hypoparathyroidism (HCC)    Low zinc level 11/26/2016   Multinodular thyroid    Goiter s/p thyroidectomy in 2007 with post-op  hypocalcemia and post-op hypothyroidism/hypoparathyroidism with hypocalcemia   Neck pain on right side 07/13/2013   Nocturia 05/31/2013   Osteoarthritis    Pain in right axilla 03/13/2016   Palpitations    a. 03/2012 - patient set up for event monitor but did not wear correctly -  she declined wearing a repeat monitor   Post-surgical  hypothyroidism    Sun-damaged skin 12/05/2014   Tubular adenoma of colon 06/2011   Unspecified constipation 05/31/2013   Vertigo    Zinc deficiency 11/26/2015   Past Surgical History:  Procedure Laterality Date   CARDIAC CATHETERIZATION N/A 03/08/2016   Procedure: Left Heart Cath and Coronary Angiography;  Surgeon: Corky Crafts, MD;  Location: [facility] INVASIVE CV LAB;  Service: Cardiovascular;  Laterality: N/A;   COLONOSCOPY  Aenomatous polyps   07/05/2011   COLONOSCOPY N/A 09/28/2014   Procedure: COLONOSCOPY;  Surgeon: Meryl Dare, MD;  Location: [facility] ENDOSCOPY;  Service: Endoscopy;  Laterality: N/A;   CORONARY ANGIOPLASTY WITH STENT PLACEMENT     LEFT HEART CATH AND CORONARY ANGIOGRAPHY N/A 12/09/2018   Procedure: LEFT HEART CATH AND CORONARY ANGIOGRAPHY;  Surgeon: Corky Crafts, MD;  Location: [facility] INVASIVE CV LAB;  Service: Cardiovascular;  Laterality: N/A;   LEFT HEART CATHETERIZATION WITH CORONARY ANGIOGRAM N/A 07/17/2012   Procedure: LEFT HEART CATHETERIZATION WITH CORONARY ANGIOGRAM;  Surgeon: Herby Abraham, MD;  Location: [facility] CATH LAB;  Service: Cardiovascular;  Laterality: N/A;   PERCUTANEOUS CORONARY STENT INTERVENTION (PCI-S) N/A 11/19/2011   Procedure: PERCUTANEOUS CORONARY STENT INTERVENTION (PCI-S);  Surgeon: Herby Abraham, MD;  Location: [facility] CATH LAB;  Service: Cardiovascular;  Laterality: N/A;   POLYPECTOMY  SHOULDER ARTHROSCOPY W/ ROTATOR CUFF REPAIR     right   TOTAL THYROIDECTOMY  2007   GOITER   Patient Active Problem List   Diagnosis Date Noted   Sinus tarsi syndrome of left ankle 04/03/2023   Sinus tarsi syndrome of right ankle 02/18/2023   Edema 02/17/2023   Acute prerenal azotemia 02/06/2023   TIA (transient ischemic attack) 02/05/2023   Paresthesia 08/28/2022   Chronic neck pain 08/28/2022   Neuropathy 08/21/2022   Vitamin deficiency 10/23/2021   Hypoglycemia 10/07/2019   Heart disease 01/29/2019   Sensorineural hearing loss (SNHL) of both  ears 01/27/2019   Primary osteoarthritis of both knees 12/19/2018   DDD (degenerative disc disease), lumbar 12/19/2018   ANA positive 10/27/2018   Anxiety 02/17/2018   Peripheral arterial disease (HCC) 02/20/2017   Skin lesion of face 01/28/2017   Benign paroxysmal positional vertigo 08/05/2016   Antiplatelet or antithrombotic long-term use 03/27/2016   Right lumbar radiculopathy 02/21/2016   Abnormality of gait 02/21/2016   Zinc deficiency 11/26/2015   Right hip pain 10/26/2015   Medicare annual wellness visit, subsequent 03/13/2015   Preventative health care 03/13/2015   Sun-damaged skin 12/05/2014   Angina of effort 12/02/2014   Hx of colonic polyps 09/28/2014   Benign neoplasm of descending colon 09/28/2014   Other fatigue 09/10/2014   Personal history of colonic polyps 07/16/2014   Bilateral thumb pain 03/20/2014   Postsurgical hypoparathyroidism (HCC) 08/03/2013   Neck pain on right side 07/13/2013   Constipation 05/31/2013   Back pain 05/31/2013   Nocturia 05/31/2013   GERD (gastroesophageal reflux disease) 11/02/2012   Tension headache 11/02/2012   Anemia 06/11/2012   Hypokalemia 06/11/2012   Depression 01/21/2012   Headache(784.0) 12/23/2011   Sinus bradycardia 12/07/2011   Hypocalcemia 12/07/2011   Acute MI, subendocardial (HCC) 11/20/2011   Acquired hypothyroidism 05/20/2009   Essential hypertension, benign 05/20/2009   Hyperlipidemia 03/08/2009   CAD, NATIVE VESSEL 03/08/2009    PCP: Danise Edge, MD REFERRING PROVIDER: Danise Edge, MD  REFERRING DIAG: BPPV  THERAPY DIAG:  Dizziness and giddiness  Cervicalgia  Chronic right-sided low back pain with right-sided sciatica  BPPV (benign paroxysmal positional vertigo), bilateral  Abnormal posture  Cramp and spasm  ONSET DATE: 2014  Rationale for Evaluation and Treatment: Rehabilitation  SUBJECTIVE:   SUBJECTIVE STATEMENT: I was able to sleep on my R side last night, much better, but it  still hurts here on my right side.   Pt accompanied by: friend  PERTINENT HISTORY: h/o chronic vertigo, referred to PT by PCP to assess for BPPV  PAIN:  Are you having pain? Yes: NPRS scale: 7/10 Pain location: right side lower back  Pain description: tender to touch Aggravating factors: twisting, bending Relieving factors: nothing  PRECAUTIONS: None  WEIGHT BEARING RESTRICTIONS: No  FALLS: Has patient fallen in last 6 months? No  LIVING ENVIRONMENT: Lives with: lives alone Lives in: House/apartment Stairs: No Has following equipment at home: None  PLOF: Independent  PATIENT GOALS: get rid of dizziness, nausea  OBJECTIVE:   DIAGNOSTIC FINDINGS: MRI at hospital recently brain and CT due to TIA/ stroke Sx , and both with negative results  COGNITION: Overall cognitive status: Within functional limits for tasks assessed   SENSATION: WFL  EDEMA:  None noted   POSTURE:  rounded shoulders, forward head, and increased thoracic kyphosis  Cervical ROM:    Active A/PROM (deg) eval  Flexion 70%  Extension 50%  Right lateral flexion   Left lateral flexion  Right rotation 50%  Left rotation 40%  (Blank rows = not tested)  STRENGTH: L thumb extension 3+/5, all other MMT elbow and distally wnl, shoulders not assessed, currently under care for R shoulder pain/ injury  LOWER EXTREMITY MMT: grossly wnl, 04/18/23:  reassessed and patient with pain with locked bridge l LE , pain R SI jt line  BED MOBILITY:  Sit to supine Modified independence Supine to sit Modified independence Rolling to Right Modified independence Rolling to Left Modified independence  TRANSFERS:all wnl RAMP:  na  CURB:  na  GAIT: Gait pattern: WFL Distance walked: in clinic over 50' Assistive device utilized: None Level of assistance: Complete Independence  SPECIAL TESTS: SI provocative testing:  thigh thrust + R, faber's + R, march test + R  Unilateral stance wnl B   PATIENT SURVEYS:   DHI 47/80 or 42% deficit  VESTIBULAR ASSESSMENT:  GENERAL OBSERVATION: guarded movements, frequently closes eyes , slow movements, avoids neck rotation with movement transitions and resting posture   SYMPTOM BEHAVIOR:  Subjective history: 10 yr h.o vertigo, no previous Rx  Non-Vestibular symptoms: neck pain, nausea/vomiting, and numbness l side of mouth  Type of dizziness: Spinning/Vertigo and Lightheadedness/Faint  Frequency: daily  Duration: a few secs to several mins  Aggravating factors: Induced by position change: rolling to the right, rolling to the left, and supine to sit, Induced by motion: bending down to the ground, turning body quickly, and turning head quickly, Worse in the dark, and Moving eyes  Relieving factors: head stationary and closing eyes  Progression of symptoms: worse  OCULOMOTOR EXAM:  Ocular Alignment: normal  Ocular ROM: No Limitations  Spontaneous Nystagmus: right beating  Gaze-Induced Nystagmus: right beating with right gaze  Smooth Pursuits: saccades  Saccades: hypermetric/overshoots and extra eye movements  Convergence/Divergence:na today    Gaze-Induced Nystagmus: absent   Positional tests: Right Dix-Hallpike: no nystagmus Left Dix-Hallpike: no nystagmus Right Roll Test: symptoms worse on left side Left Roll Test: symptoms worse on left side  VESTIBULAR - OCULAR REFLEX: nt today  POSITIONAL TESTING: Right Dix-Hallpike: no nystagmus Left Dix-Hallpike: no nystagmus Right Roll Test: no nystagmus Left Roll Test: no nystagmus  MOTION SENSITIVITY:  Motion Sensitivity Quotient Intensity: 0 = none, 1 = Lightheaded, 2 = Mild, 3 = Moderate, 4 = Severe, 5 = Vomiting  Intensity  1. Sitting to supine   2. Supine to L side   3. Supine to R side   4. Supine to sitting   5. L Hallpike-Dix   6. Up from L    7. R Hallpike-Dix   8. Up from R    9. Sitting, head tipped to L knee   10. Head up from L knee   11. Sitting, head tipped to R knee   12. Head  up from R knee   13. Sitting head turns x5   14.Sitting head nods x5   15. In stance, 180 turn to L    16. In stance, 180 turn to R     OTHOSTATICS: not done  FUNCTIONAL GAIT:  na today   TREATMENT:  DATE:  04/18/23: Manual: prone for dry needling :Trigger Point Dry-Needling  Treatment instructions: Expect mild to moderate muscle soreness. S/S of pneumothorax if dry needled over a lung field, and to seek immediate medical attention should they occur. Patient verbalized understanding of these instructions and education. Patient Consent Given: Yes Education handout provided: Previously provided Muscles treated: R glut medius R TFL, R glut max Electrical stimulation performed: No Parameters: N/A Treatment response/outcome: Twitch Response Elicited and Palpable Increase in Muscle Length  Followed dry needling with  foam roller massage ., myofascial release R gluteals and LS region  Muscle energy for SI innominate, isometric resisted knee to chest B alternating , 5 sec holds, 3 reps each,  instructed patient in performance of same for home  Seated for isometric hip abd/ER 30 sec holds, 3 sets,  04/09/23: Manual: to decrease myofascial pain: Prone for Trigger Point Dry-Needling  Treatment instructions: Expect mild to moderate muscle soreness. S/S of pneumothorax if dry needled over a lung field, and to seek immediate medical attention should they occur. Patient verbalized understanding of these instructions and education. Patient Consent Given: Yes Education handout provided: Previously provided Muscles treated: R piriformis, R glut max, R glut med, R glut min Electrical stimulation performed: No Parameters: N/A Treatment response/outcome: Twitch Response Elicited and Palpable Increase in Muscle Length   After dry needling, therapist assisted with several manual stretches for lumbar  region, B knee to chest, alternating piriformis stretch, resisted isometric hip and knee extension, alternating, 3 sets 5 sec holds, hamstring stretch B  03/14/23 Manual Therapy: to decrease muscle spasm and pain and improve mobility R UPA mobs lumbar spine grade 1-2, STM/TPR to R lumbar paraspinals, glutes and piriformis, R SIJ, R QL, IASTM with foam roller to R QL, glutes and proximal hamstrings, skilled palpation and monitoring during dry needling. Trigger Point Dry-Needling  Treatment instructions: Expect mild to moderate muscle soreness.  Patient verbalized understanding of these instructions and education. Patient Consent Given: Yes Education handout provided: Previously provided Muscles treated: R piriformis, R glut med Treatment response/outcome: Twitch Response Elicited and Palpable Increase in Muscle Length Modalities: IFC to lumbar spine x 15 min with CP to low back in prone.    03/13/23  Therapeutic Activity:  assessment low back pain Manual Therapy: to decrease muscle spasm and pain and improve mobility R UPA mobs lumbar spine grade 1-2, STM/TPR to R lumbar paraspinals, glutes and piriformis, skilled palpation and monitoring during dry needling. Trigger Point Dry-Needling  Treatment instructions: Expect mild to moderate muscle soreness.  Patient verbalized understanding of these instructions and education. Patient Consent Given: Yes Education handout provided: Previously provided Muscles treated: R piriformis Treatment response/outcome: Twitch Response Elicited and Palpable Increase in Muscle Length Modalities: IFC to lumbar spine x 15 min with CP to low back in prone.     02/26/23 Canalith Repositioning:  Epley Right: Number of Reps: 1 and Response to Treatment: symptoms improved   PATIENT EDUCATION: Education details: apply CP to low back x 15 min at home as needed for pain, contact PCP Person educated: Patient Education method: Explanation Education comprehension:  verbalized understanding  HOME EXERCISE PROGRAM:  GOALS: Goals reviewed with patient? Yes  SHORT TERM GOALS: Target date: 03/12/23  Patient will report compliance with initial HEP.  Baseline:not initiated yet Goal status: IN PROGRESS  2.  Patient will report resolution of dizziness rolling over in bed.  Baseline: consistent dizziness Goal status: IN PROGRESS 03/13/23- improving  3.  Patient will complete further balance  testing.  Baseline:  Goal status: IN PROGRESS LONG TERM GOALS: Target date: 06/13/23   1.Patient will be independent with progressed HEP to improve outcomes and carryover.  Baseline: not initiated yet Goal status: IN PROGRESS        2. Patient will demonstrate 26/30 score on FGA to decrease risk of falls.  Baseline: na yet Goal status: IN PROGRESS   Patient will report 56 or greater on DHI to demonstrate improved QOL. Baseline: 47 Goal status: IN PROGRESS Patient will be independent with initial HEP.  Baseline: given Goal status: MET - 11/21/22   LONG TERM GOALS for LS spine pain: Target date: 06/13/2023   Patient will be independent with advanced/ongoing HEP to improve outcomes and carryover.  Baseline:  Goal status: IN PROGRESS 01/31/23- good compliance, advanced today  2.  Patient will report 75% improvement in neck pain to improve QOL.  Baseline: severe Goal status: IN PROGRESS- 12/19/22 65% improvement 01/31/2023- 70% improvement.   3.  Patient will demonstrate improved cervical ROM by 10 deg all directions for safety with driving.  Baseline: see objective Goal status: IN PROGRESS-01/31/23- decreased due to shoulder pain  4.  Patient will report 15% improvement on NDI to demonstrate improved functional ability.  Baseline: 46% Goal status: IN PROGRESS 01/31/23- 34%  5.  Patient will report 75% improvement in low back pain to improve quality of life. Baseline:   Goal status: MET- 11/27/22  60-70% ; 01/31/23- 75% 04/18/23:  lower back pain ebbs and flows,  back today and last week due to pain  6. Patient will report 12% improvement on modified Oswestry to demonstrate improved functional ability. Baseline: 12/19/22 -30% Goal status: MET   01/31/23- 18% impairment. 04/18/23: 14/50 or 28% disability  7. Patient will will be able to return to community-based exercise program to maintain progress. Baseline: stopped exercising due to pain Goal status: IN PROGRESS 04/18/23 not exercising consistently   ASSESSMENT:  CLINICAL IMPRESSION: [name] c/o R sided lower back and post hip pain CONTINUING  after her 3 week trip to and from [place] recently. Reassessed today for her LS spine pain: noted consistent R SI jt lin tenderness and pain. Also some assymetry, probable R ant innominate.  Very tender as well along R gluteals and lateral hip over gr torchanter.  Reassessed her goals for R LS pain.  She repsonded fairly well to the muscle energy technqiues as well as dry needling.  She also intends to continue to attend due to intermittent episodic vertigo.  Did not assess the vertigo today, wants to have Rx for vertigo next visit as she needs a driver afterwards. Extended her current POC for an additional 8 weeks for the vertigo and intermittent R sided LS spine pain.    [name] is going to continue, will reassess next visit for vestibular if needed or LS spine pain      OBJECTIVE IMPAIRMENTS: decreased activity tolerance, decreased ROM, dizziness, and postural dysfunction.   ACTIVITY LIMITATIONS: lifting, bending, sleeping, bed mobility, and reach over head  PARTICIPATION LIMITATIONS: cleaning, laundry, and community activity  PERSONAL FACTORS: Time since onset of injury/illness/exacerbation and 1-2 comorbidities: arthritis multiple sites,HTN  are also affecting patient's functional outcome.   REHAB POTENTIAL: Good  CLINICAL DECISION MAKING: Stable/uncomplicated  EVALUATION COMPLEXITY: Low   PLAN:  PT FREQUENCY: 1-2x/week  PT  DURATION: 6 weeks  PLANNED INTERVENTIONS: Therapeutic exercises, Therapeutic activity, Neuromuscular re-education, Balance training, Gait training, Patient/Family education, Self Care, Joint mobilization, and Canalith repositioning  PLAN FOR NEXT SESSION: reassess BPPV, perform VOR tests , further assess cervical spine   [name], PT, DPT  04/18/2023, 6:03 PM

## 2023-04-23 ENCOUNTER — Ambulatory Visit: Payer: 59

## 2023-04-23 ENCOUNTER — Telehealth: Payer: Self-pay | Admitting: Family Medicine

## 2023-04-23 DIAGNOSIS — M7989 Other specified soft tissue disorders: Secondary | ICD-10-CM | POA: Diagnosis not present

## 2023-04-23 DIAGNOSIS — I83891 Varicose veins of right lower extremities with other complications: Secondary | ICD-10-CM | POA: Diagnosis not present

## 2023-04-23 DIAGNOSIS — I83811 Varicose veins of right lower extremities with pain: Secondary | ICD-10-CM | POA: Diagnosis not present

## 2023-04-23 NOTE — Telephone Encounter (Signed)
Copied from CRM [id number]. Topic: Medicare AWV >> Apr 23, 2023  2:10 PM Payton Doughty wrote: Reason for CRM: Called patient to schedule Medicare Annual Wellness Visit (AWV). Left message for patient to call back and schedule Medicare Annual Wellness Visit (AWV).  Last date of AWV: 05/07/22  Please schedule an appointment at any time with Donne Anon, CMA  .  If any questions, please contact me.  Thank you ,  Verlee Rossetti; Care Guide Ambulatory Clinical Support [place] [facility] Direct Dial: [phone]

## 2023-04-27 ENCOUNTER — Other Ambulatory Visit: Payer: Self-pay | Admitting: Family Medicine

## 2023-04-27 DIAGNOSIS — K2100 Gastro-esophageal reflux disease with esophagitis, without bleeding: Secondary | ICD-10-CM

## 2023-05-01 ENCOUNTER — Encounter: Payer: Self-pay | Admitting: Physical Therapy

## 2023-05-01 ENCOUNTER — Ambulatory Visit: Payer: 59 | Attending: Family Medicine | Admitting: Physical Therapy

## 2023-05-01 DIAGNOSIS — R293 Abnormal posture: Secondary | ICD-10-CM | POA: Diagnosis not present

## 2023-05-01 DIAGNOSIS — R42 Dizziness and giddiness: Secondary | ICD-10-CM

## 2023-05-01 DIAGNOSIS — H8113 Benign paroxysmal vertigo, bilateral: Secondary | ICD-10-CM | POA: Diagnosis not present

## 2023-05-01 DIAGNOSIS — G8929 Other chronic pain: Secondary | ICD-10-CM

## 2023-05-01 DIAGNOSIS — M5441 Lumbago with sciatica, right side: Secondary | ICD-10-CM | POA: Insufficient documentation

## 2023-05-01 DIAGNOSIS — M542 Cervicalgia: Secondary | ICD-10-CM

## 2023-05-01 NOTE — Therapy (Addendum)
OUTPATIENT PHYSICAL THERAPY TREATMENT   Patient Name: [name] MRN: [id number] DOB:1946-06-30, 77 y.o., female Today's Date: 05/01/2023  END OF SESSION:  PT End of Session - 05/01/23 1510     Visit Number 27    Date for PT Re-Evaluation 06/13/23    Authorization Type [employer] Medicare    Progress Note Due on Visit 35    PT Start Time 1511    PT Stop Time 1536    PT Time Calculation (min) 25 min    Activity Tolerance Patient tolerated treatment well    Behavior During Therapy [facility] for tasks assessed/performed              Past Medical History:  Diagnosis Date   Acute MI, subendocardial (HCC)    Arthritis    Back pain 05/31/2013   Benign paroxysmal positional vertigo 08/05/2016   CAD (coronary artery disease)    a. NSTEMI 10/2011: LHC 11/19/11: pLAD 30%, oOM 60%, AVCFX 30%, CFX after OM2 30%, pRCA 60 and 70%, then 99%, AM 80-90% with TIMI 3 flow.  PCI: Promus DES x 2 to RCA; b. 06/2012 Cath: patent RCA stents w/ subtl occl of Acute Marginal (jailed)->Med rx; c. 05/2015 MV: EF 59%, mod mid infsept/inf/ap lat/ap infarct with peri-infarct isch-->Med Rx; d. 02/2016 Cath: LM nl, LAD 83m, RI 50, RCA patent stents.   Colitis    Facial skin lesion 01/28/2017   GERD (gastroesophageal reflux disease)    occasional   Hyperlipidemia    Hypertension    Hypertensive heart disease    a. Echocardiogram 11/19/11: Difficult acoustic windows, EF 60-65%, normal LV wall thickness, grade 1 diastolic dysfunction   Hypoparathyroidism (HCC)    Low zinc level 11/26/2016   Multinodular thyroid    Goiter s/p thyroidectomy in 2007 with post-op  hypocalcemia and post-op hypothyroidism/hypoparathyroidism with hypocalcemia   Neck pain on right side 07/13/2013   Nocturia 05/31/2013   Osteoarthritis    Pain in right axilla 03/13/2016   Palpitations    a. 03/2012 - patient set up for event monitor but did not wear correctly -  she declined wearing a repeat monitor   Post-surgical hypothyroidism    Sun-damaged  skin 12/05/2014   Tubular adenoma of colon 06/2011   Unspecified constipation 05/31/2013   Vertigo    Zinc deficiency 11/26/2015   Past Surgical History:  Procedure Laterality Date   CARDIAC CATHETERIZATION N/A 03/08/2016   Procedure: Left Heart Cath and Coronary Angiography;  Surgeon: Corky Crafts, MD;  Location: [facility] INVASIVE CV LAB;  Service: Cardiovascular;  Laterality: N/A;   COLONOSCOPY  Aenomatous polyps   07/05/2011   COLONOSCOPY N/A 09/28/2014   Procedure: COLONOSCOPY;  Surgeon: Meryl Dare, MD;  Location: [facility] ENDOSCOPY;  Service: Endoscopy;  Laterality: N/A;   CORONARY ANGIOPLASTY WITH STENT PLACEMENT     LEFT HEART CATH AND CORONARY ANGIOGRAPHY N/A 12/09/2018   Procedure: LEFT HEART CATH AND CORONARY ANGIOGRAPHY;  Surgeon: Corky Crafts, MD;  Location: [facility] INVASIVE CV LAB;  Service: Cardiovascular;  Laterality: N/A;   LEFT HEART CATHETERIZATION WITH CORONARY ANGIOGRAM N/A 07/17/2012   Procedure: LEFT HEART CATHETERIZATION WITH CORONARY ANGIOGRAM;  Surgeon: Herby Abraham, MD;  Location: [facility] CATH LAB;  Service: Cardiovascular;  Laterality: N/A;   PERCUTANEOUS CORONARY STENT INTERVENTION (PCI-S) N/A 11/19/2011   Procedure: PERCUTANEOUS CORONARY STENT INTERVENTION (PCI-S);  Surgeon: Herby Abraham, MD;  Location: [facility] CATH LAB;  Service: Cardiovascular;  Laterality: N/A;   POLYPECTOMY     SHOULDER ARTHROSCOPY W/  ROTATOR CUFF REPAIR     right   TOTAL THYROIDECTOMY  2007   GOITER   Patient Active Problem List   Diagnosis Date Noted   Sinus tarsi syndrome of left ankle 04/03/2023   Sinus tarsi syndrome of right ankle 02/18/2023   Edema 02/17/2023   Acute prerenal azotemia 02/06/2023   TIA (transient ischemic attack) 02/05/2023   Paresthesia 08/28/2022   Chronic neck pain 08/28/2022   Neuropathy 08/21/2022   Vitamin deficiency 10/23/2021   Hypoglycemia 10/07/2019   Heart disease 01/29/2019   Sensorineural hearing loss (SNHL) of both ears 01/27/2019   Primary  osteoarthritis of both knees 12/19/2018   DDD (degenerative disc disease), lumbar 12/19/2018   ANA positive 10/27/2018   Anxiety 02/17/2018   Peripheral arterial disease (HCC) 02/20/2017   Skin lesion of face 01/28/2017   Benign paroxysmal positional vertigo 08/05/2016   Antiplatelet or antithrombotic long-term use 03/27/2016   Right lumbar radiculopathy 02/21/2016   Abnormality of gait 02/21/2016   Zinc deficiency 11/26/2015   Right hip pain 10/26/2015   Medicare annual wellness visit, subsequent 03/13/2015   Preventative health care 03/13/2015   Sun-damaged skin 12/05/2014   Angina of effort 12/02/2014   Hx of colonic polyps 09/28/2014   Benign neoplasm of descending colon 09/28/2014   Other fatigue 09/10/2014   Personal history of colonic polyps 07/16/2014   Bilateral thumb pain 03/20/2014   Postsurgical hypoparathyroidism (HCC) 08/03/2013   Neck pain on right side 07/13/2013   Constipation 05/31/2013   Back pain 05/31/2013   Nocturia 05/31/2013   GERD (gastroesophageal reflux disease) 11/02/2012   Tension headache 11/02/2012   Anemia 06/11/2012   Hypokalemia 06/11/2012   Depression 01/21/2012   Headache(784.0) 12/23/2011   Sinus bradycardia 12/07/2011   Hypocalcemia 12/07/2011   Acute MI, subendocardial (HCC) 11/20/2011   Acquired hypothyroidism 05/20/2009   Essential hypertension, benign 05/20/2009   Hyperlipidemia 03/08/2009   CAD, NATIVE VESSEL 03/08/2009    PCP: Danise Edge, MD REFERRING PROVIDER: Danise Edge, MD  REFERRING DIAG: BPPV  THERAPY DIAG:  Dizziness and giddiness  Cervicalgia  Chronic right-sided low back pain with right-sided sciatica  ONSET DATE: 2014  Rationale for Evaluation and Treatment: Rehabilitation  SUBJECTIVE:   SUBJECTIVE STATEMENT: [name]  reports that her dizziness is better, but she has an appointment at [address] after this so doesn't want to check it today, having some pain in her upper shoulders and  still right side glutes she would like to focus on.  Pt accompanied by: self  PERTINENT HISTORY: h/o chronic vertigo, referred to PT by PCP to assess for BPPV  PAIN:  Are you having pain? Yes: NPRS scale: 6/10 Pain location: right side lower back  Pain description: tender to touch Aggravating factors: twisting, bending Relieving factors: nothing  PRECAUTIONS: None  WEIGHT BEARING RESTRICTIONS: No  FALLS: Has patient fallen in last 6 months? No  LIVING ENVIRONMENT: Lives with: lives alone Lives in: House/apartment Stairs: No Has following equipment at home: None  PLOF: Independent  PATIENT GOALS: get rid of dizziness, nausea  OBJECTIVE:   DIAGNOSTIC FINDINGS: MRI at hospital recently brain and CT due to TIA/ stroke Sx , and both with negative results  COGNITION: Overall cognitive status: Within functional limits for tasks assessed   SENSATION: WFL  EDEMA:  None noted   POSTURE:  rounded shoulders, forward head, and increased thoracic kyphosis  Cervical ROM:    Active A/PROM (deg) eval  Flexion 70%  Extension 50%  Right lateral flexion  Left lateral flexion   Right rotation 50%  Left rotation 40%  (Blank rows = not tested)  STRENGTH: L thumb extension 3+/5, all other MMT elbow and distally wnl, shoulders not assessed, currently under care for R shoulder pain/ injury  LOWER EXTREMITY MMT: grossly wnl, 04/18/23:  reassessed and patient with pain with locked bridge l LE , pain R SI jt line  BED MOBILITY:  Sit to supine Modified independence Supine to sit Modified independence Rolling to Right Modified independence Rolling to Left Modified independence  TRANSFERS:all wnl RAMP:  na  CURB:  na  GAIT: Gait pattern: WFL Distance walked: in clinic over 50' Assistive device utilized: None Level of assistance: Complete Independence  SPECIAL TESTS: SI provocative testing:  thigh thrust + R, faber's + R, march test + R  Unilateral stance wnl  B   PATIENT SURVEYS:  DHI 47/80 or 42% deficit  VESTIBULAR ASSESSMENT:  GENERAL OBSERVATION: guarded movements, frequently closes eyes , slow movements, avoids neck rotation with movement transitions and resting posture   SYMPTOM BEHAVIOR:  Subjective history: 10 yr h.o vertigo, no previous Rx  Non-Vestibular symptoms: neck pain, nausea/vomiting, and numbness l side of mouth  Type of dizziness: Spinning/Vertigo and Lightheadedness/Faint  Frequency: daily  Duration: a few secs to several mins  Aggravating factors: Induced by position change: rolling to the right, rolling to the left, and supine to sit, Induced by motion: bending down to the ground, turning body quickly, and turning head quickly, Worse in the dark, and Moving eyes  Relieving factors: head stationary and closing eyes  Progression of symptoms: worse  OCULOMOTOR EXAM:  Ocular Alignment: normal  Ocular ROM: No Limitations  Spontaneous Nystagmus: right beating  Gaze-Induced Nystagmus: right beating with right gaze  Smooth Pursuits: saccades  Saccades: hypermetric/overshoots and extra eye movements  Convergence/Divergence:na today    Gaze-Induced Nystagmus: absent   Positional tests: Right Dix-Hallpike: no nystagmus Left Dix-Hallpike: no nystagmus Right Roll Test: symptoms worse on left side Left Roll Test: symptoms worse on left side  VESTIBULAR - OCULAR REFLEX: nt today  POSITIONAL TESTING: Right Dix-Hallpike: no nystagmus Left Dix-Hallpike: no nystagmus Right Roll Test: no nystagmus Left Roll Test: no nystagmus  MOTION SENSITIVITY:  Motion Sensitivity Quotient Intensity: 0 = none, 1 = Lightheaded, 2 = Mild, 3 = Moderate, 4 = Severe, 5 = Vomiting  Intensity  1. Sitting to supine   2. Supine to L side   3. Supine to R side   4. Supine to sitting   5. L Hallpike-Dix   6. Up from L    7. R Hallpike-Dix   8. Up from R    9. Sitting, head tipped to L knee   10. Head up from L knee   11. Sitting, head  tipped to R knee   12. Head up from R knee   13. Sitting head turns x5   14.Sitting head nods x5   15. In stance, 180 turn to L    16. In stance, 180 turn to R     OTHOSTATICS: not done  FUNCTIONAL GAIT:  na today   TREATMENT:  DATE:  05/01/23 Manual Therapy: to decrease muscle spasm and pain and improve mobility STM/TPR to bil UT, L thoracic paraspinals and QL, L UPA mobs lower thoracic spine, R glutes/piriformis, skilled palpation and monitoring during dry needling.  IASTM with foam roller to glutes after dry needling for further muscle relaxation.  Trigger Point Dry-Needling  Treatment instructions: Expect mild to moderate muscle soreness. S/S of pneumothorax if dry needled over a lung field, and to seek immediate medical attention should they occur. Patient verbalized understanding of these instructions and education. Patient Consent Given: Yes Education handout provided: Previously provided Muscles treated: L UT, R glut medius and piriformis Electrical stimulation performed: No Parameters: N/A Treatment response/outcome: Twitch Response Elicited and Palpable Increase in Muscle Length  04/18/23: Manual: prone for dry needling :Trigger Point Dry-Needling  Treatment instructions: Expect mild to moderate muscle soreness. S/S of pneumothorax if dry needled over a lung field, and to seek immediate medical attention should they occur. Patient verbalized understanding of these instructions and education. Patient Consent Given: Yes Education handout provided: Previously provided Muscles treated: R glut medius R TFL, R glut max Electrical stimulation performed: No Parameters: N/A Treatment response/outcome: Twitch Response Elicited and Palpable Increase in Muscle Length  Followed dry needling with  foam roller massage ., myofascial release R gluteals and LS region  Muscle energy for SI  innominate, isometric resisted knee to chest B alternating , 5 sec holds, 3 reps each,  instructed patient in performance of same for home  Seated for isometric hip abd/ER 30 sec holds, 3 sets,  04/09/23: Manual: to decrease myofascial pain: Prone for Trigger Point Dry-Needling  Treatment instructions: Expect mild to moderate muscle soreness. S/S of pneumothorax if dry needled over a lung field, and to seek immediate medical attention should they occur. Patient verbalized understanding of these instructions and education. Patient Consent Given: Yes Education handout provided: Previously provided Muscles treated: R piriformis, R glut max, R glut med, R glut min Electrical stimulation performed: No Parameters: N/A Treatment response/outcome: Twitch Response Elicited and Palpable Increase in Muscle Length   After dry needling, therapist assisted with several manual stretches for lumbar region, B knee to chest, alternating piriformis stretch, resisted isometric hip and knee extension, alternating, 3 sets 5 sec holds, hamstring stretch B  PATIENT EDUCATION: Education details: NA Person educated: Patient Education method: Explanation Education comprehension: verbalized understanding  HOME EXERCISE PROGRAM:  GOALS: Goals reviewed with patient? Yes  SHORT TERM GOALS: Target date: 03/12/23  Patient will report compliance with initial HEP.  Baseline:not initiated yet Goal status: IN PROGRESS  2.  Patient will report resolution of dizziness rolling over in bed.  Baseline: consistent dizziness Goal status: IN PROGRESS 03/13/23- improving  3.  Patient will complete further balance testing.  Baseline:  Goal status: IN PROGRESS LONG TERM GOALS: Target date: 06/13/23   1.Patient will be independent with progressed HEP to improve outcomes and carryover.  Baseline: not initiated yet Goal status: IN PROGRESS        2. Patient will demonstrate 26/30 score on FGA to decrease risk of falls.   Baseline: na yet Goal status: IN PROGRESS   Patient will report 56 or greater on DHI to demonstrate improved QOL. Baseline: 47 Goal status: IN PROGRESS Patient will be independent with initial HEP.  Baseline: given Goal status: MET - 11/21/22   LONG TERM GOALS for LS spine pain: Target date: 06/13/2023   Patient will be independent with advanced/ongoing HEP to improve outcomes and carryover.  Baseline:  Goal status: IN PROGRESS 01/31/23- good compliance, advanced today  2.  Patient will report 75% improvement in neck pain to improve QOL.  Baseline: severe Goal status: IN PROGRESS- 12/19/22 65% improvement 01/31/2023- 70% improvement.   3.  Patient will demonstrate improved cervical ROM by 10 deg all directions for safety with driving.  Baseline: see objective Goal status: IN PROGRESS-01/31/23- decreased due to shoulder pain  4.  Patient will report 15% improvement on NDI to demonstrate improved functional ability.  Baseline: 46% Goal status: IN PROGRESS 01/31/23- 34%  5.  Patient will report 75% improvement in low back pain to improve quality of life. Baseline:   Goal status: MET- 11/27/22  60-70% ; 01/31/23- 75% 04/18/23:  lower back pain ebbs and flows, back today and last week due to pain  6. Patient will report 12% improvement on modified Oswestry to demonstrate improved functional ability. Baseline: 12/19/22 -30% Goal status: MET   01/31/23- 18% impairment. 04/18/23: 14/50 or 28% disability  7. Patient will will be able to return to community-based exercise program to maintain progress. Baseline: stopped exercising due to pain Goal status: IN PROGRESS 04/18/23 not exercising consistently   ASSESSMENT:  CLINICAL IMPRESSION: Kory T Smalling reports overall improvement in symptoms but continues to have tenderpoints in R glutes and spasm in L side lower thoracic region today, as well as increased tension bil UT, requesting short visit with TrDN, does not tolerate well so after brief  focused TrDN focused on STM to relieve muscle spasm, before leaving early in order to get to next appointment.    Madilyn T Montufar is going to continue, will reassess next visit for vestibular if needed or LS spine pain      OBJECTIVE IMPAIRMENTS: decreased activity tolerance, decreased ROM, dizziness, and postural dysfunction.   ACTIVITY LIMITATIONS: lifting, bending, sleeping, bed mobility, and reach over head  PARTICIPATION LIMITATIONS: cleaning, laundry, and community activity  PERSONAL FACTORS: Time since onset of injury/illness/exacerbation and 1-2 comorbidities: arthritis multiple sites,HTN  are also affecting patient's functional outcome.   REHAB POTENTIAL: Good  CLINICAL DECISION MAKING: Stable/uncomplicated  EVALUATION COMPLEXITY: Low   PLAN:  PT FREQUENCY: 1-2x/week  PT DURATION: 6 weeks  PLANNED INTERVENTIONS: Therapeutic exercises, Therapeutic activity, Neuromuscular re-education, Balance training, Gait training, Patient/Family education, Self Care, Joint mobilization, and Canalith repositioning  PLAN FOR NEXT SESSION: reassess BPPV, perform VOR tests , further assess cervical spine   Jena Gauss, PT, DPT  05/01/2023, 3:45 PM

## 2023-05-02 ENCOUNTER — Other Ambulatory Visit: Payer: Self-pay | Admitting: Internal Medicine

## 2023-05-02 DIAGNOSIS — R079 Chest pain, unspecified: Secondary | ICD-10-CM

## 2023-05-07 ENCOUNTER — Other Ambulatory Visit: Payer: Self-pay

## 2023-05-07 ENCOUNTER — Ambulatory Visit: Payer: 59

## 2023-05-07 DIAGNOSIS — M542 Cervicalgia: Secondary | ICD-10-CM | POA: Diagnosis not present

## 2023-05-07 DIAGNOSIS — M5441 Lumbago with sciatica, right side: Secondary | ICD-10-CM | POA: Diagnosis not present

## 2023-05-07 DIAGNOSIS — H8113 Benign paroxysmal vertigo, bilateral: Secondary | ICD-10-CM

## 2023-05-07 DIAGNOSIS — R42 Dizziness and giddiness: Secondary | ICD-10-CM

## 2023-05-07 DIAGNOSIS — R293 Abnormal posture: Secondary | ICD-10-CM

## 2023-05-07 DIAGNOSIS — G8929 Other chronic pain: Secondary | ICD-10-CM | POA: Diagnosis not present

## 2023-05-07 NOTE — Therapy (Signed)
OUTPATIENT PHYSICAL THERAPY TREATMENT   Patient Name: [name] MRN: [id number] DOB:1946-10-16, 77 y.o., female Today's Date: 05/07/2023  END OF SESSION:  PT End of Session - 05/07/23 1447     Visit Number 28    Date for PT Re-Evaluation 06/13/23    Authorization Type [employer] Medicare    Progress Note Due on Visit 35    PT Start Time 1445    PT Stop Time 1520    PT Time Calculation (min) 35 min    Activity Tolerance Patient tolerated treatment well    Behavior During Therapy [facility] for tasks assessed/performed              Past Medical History:  Diagnosis Date   Acute MI, subendocardial (HCC)    Arthritis    Back pain 05/31/2013   Benign paroxysmal positional vertigo 08/05/2016   CAD (coronary artery disease)    a. NSTEMI 10/2011: LHC 11/19/11: pLAD 30%, oOM 60%, AVCFX 30%, CFX after OM2 30%, pRCA 60 and 70%, then 99%, AM 80-90% with TIMI 3 flow.  PCI: Promus DES x 2 to RCA; b. 06/2012 Cath: patent RCA stents w/ subtl occl of Acute Marginal (jailed)->Med rx; c. 05/2015 MV: EF 59%, mod mid infsept/inf/ap lat/ap infarct with peri-infarct isch-->Med Rx; d. 02/2016 Cath: LM nl, LAD 67m, RI 50, RCA patent stents.   Colitis    Facial skin lesion 01/28/2017   GERD (gastroesophageal reflux disease)    occasional   Hyperlipidemia    Hypertension    Hypertensive heart disease    a. Echocardiogram 11/19/11: Difficult acoustic windows, EF 60-65%, normal LV wall thickness, grade 1 diastolic dysfunction   Hypoparathyroidism (HCC)    Low zinc level 11/26/2016   Multinodular thyroid    Goiter s/p thyroidectomy in 2007 with post-op  hypocalcemia and post-op hypothyroidism/hypoparathyroidism with hypocalcemia   Neck pain on right side 07/13/2013   Nocturia 05/31/2013   Osteoarthritis    Pain in right axilla 03/13/2016   Palpitations    a. 03/2012 - patient set up for event monitor but did not wear correctly -  she declined wearing a repeat monitor   Post-surgical hypothyroidism    Sun-damaged  skin 12/05/2014   Tubular adenoma of colon 06/2011   Unspecified constipation 05/31/2013   Vertigo    Zinc deficiency 11/26/2015   Past Surgical History:  Procedure Laterality Date   CARDIAC CATHETERIZATION N/A 03/08/2016   Procedure: Left Heart Cath and Coronary Angiography;  Surgeon: Corky Crafts, MD;  Location: [facility] INVASIVE CV LAB;  Service: Cardiovascular;  Laterality: N/A;   COLONOSCOPY  Aenomatous polyps   07/05/2011   COLONOSCOPY N/A 09/28/2014   Procedure: COLONOSCOPY;  Surgeon: Meryl Dare, MD;  Location: [facility] ENDOSCOPY;  Service: Endoscopy;  Laterality: N/A;   CORONARY ANGIOPLASTY WITH STENT PLACEMENT     LEFT HEART CATH AND CORONARY ANGIOGRAPHY N/A 12/09/2018   Procedure: LEFT HEART CATH AND CORONARY ANGIOGRAPHY;  Surgeon: Corky Crafts, MD;  Location: [facility] INVASIVE CV LAB;  Service: Cardiovascular;  Laterality: N/A;   LEFT HEART CATHETERIZATION WITH CORONARY ANGIOGRAM N/A 07/17/2012   Procedure: LEFT HEART CATHETERIZATION WITH CORONARY ANGIOGRAM;  Surgeon: Herby Abraham, MD;  Location: [facility] CATH LAB;  Service: Cardiovascular;  Laterality: N/A;   PERCUTANEOUS CORONARY STENT INTERVENTION (PCI-S) N/A 11/19/2011   Procedure: PERCUTANEOUS CORONARY STENT INTERVENTION (PCI-S);  Surgeon: Herby Abraham, MD;  Location: [facility] CATH LAB;  Service: Cardiovascular;  Laterality: N/A;   POLYPECTOMY     SHOULDER ARTHROSCOPY W/  ROTATOR CUFF REPAIR     right   TOTAL THYROIDECTOMY  2007   GOITER   Patient Active Problem List   Diagnosis Date Noted   Sinus tarsi syndrome of left ankle 04/03/2023   Sinus tarsi syndrome of right ankle 02/18/2023   Edema 02/17/2023   Acute prerenal azotemia 02/06/2023   TIA (transient ischemic attack) 02/05/2023   Paresthesia 08/28/2022   Chronic neck pain 08/28/2022   Neuropathy 08/21/2022   Vitamin deficiency 10/23/2021   Hypoglycemia 10/07/2019   Heart disease 01/29/2019   Sensorineural hearing loss (SNHL) of both ears 01/27/2019   Primary  osteoarthritis of both knees 12/19/2018   DDD (degenerative disc disease), lumbar 12/19/2018   ANA positive 10/27/2018   Anxiety 02/17/2018   Peripheral arterial disease (HCC) 02/20/2017   Skin lesion of face 01/28/2017   Benign paroxysmal positional vertigo 08/05/2016   Antiplatelet or antithrombotic long-term use 03/27/2016   Right lumbar radiculopathy 02/21/2016   Abnormality of gait 02/21/2016   Zinc deficiency 11/26/2015   Right hip pain 10/26/2015   Medicare annual wellness visit, subsequent 03/13/2015   Preventative health care 03/13/2015   Sun-damaged skin 12/05/2014   Angina of effort 12/02/2014   Hx of colonic polyps 09/28/2014   Benign neoplasm of descending colon 09/28/2014   Other fatigue 09/10/2014   Personal history of colonic polyps 07/16/2014   Bilateral thumb pain 03/20/2014   Postsurgical hypoparathyroidism (HCC) 08/03/2013   Neck pain on right side 07/13/2013   Constipation 05/31/2013   Back pain 05/31/2013   Nocturia 05/31/2013   GERD (gastroesophageal reflux disease) 11/02/2012   Tension headache 11/02/2012   Anemia 06/11/2012   Hypokalemia 06/11/2012   Depression 01/21/2012   Headache(784.0) 12/23/2011   Sinus bradycardia 12/07/2011   Hypocalcemia 12/07/2011   Acute MI, subendocardial (HCC) 11/20/2011   Acquired hypothyroidism 05/20/2009   Essential hypertension, benign 05/20/2009   Hyperlipidemia 03/08/2009   CAD, NATIVE VESSEL 03/08/2009    PCP: Danise Edge, MD REFERRING PROVIDER: Danise Edge, MD  REFERRING DIAG: BPPV  THERAPY DIAG:  Dizziness and giddiness  Cervicalgia  Chronic right-sided low back pain with right-sided sciatica  BPPV (benign paroxysmal positional vertigo), bilateral  Abnormal posture  ONSET DATE: 2014  Rationale for Evaluation and Treatment: Rehabilitation  SUBJECTIVE:   SUBJECTIVE STATEMENT: [name]  reports that she would like vertigo assessment and treatment today.  Consistent reports of nausea  when turning over and sitting up in bed.  Pt accompanied by: self, her daughter is in car to drive her home today. Also requests Rx of R post hip due to ongoing musculoskeletal pain.  PERTINENT HISTORY: h/o chronic vertigo, referred to PT by PCP to assess for BPPV  PAIN:  Are you having pain? Yes: NPRS scale: 6/10 Pain location: right side lower back  Pain description: tender to touch Aggravating factors: twisting, bending Relieving factors: nothing  PRECAUTIONS: None  WEIGHT BEARING RESTRICTIONS: No  FALLS: Has patient fallen in last 6 months? No  LIVING ENVIRONMENT: Lives with: lives alone Lives in: House/apartment Stairs: No Has following equipment at home: None  PLOF: Independent  PATIENT GOALS: get rid of dizziness, nausea  OBJECTIVE:   DIAGNOSTIC FINDINGS: MRI at hospital recently brain and CT due to TIA/ stroke Sx , and both with negative results  COGNITION: Overall cognitive status: Within functional limits for tasks assessed   SENSATION: WFL  EDEMA:  None noted   POSTURE:  rounded shoulders, forward head, and increased thoracic kyphosis  Cervical ROM:  Active A/PROM (deg) eval  Flexion 70%  Extension 50%  Right lateral flexion   Left lateral flexion   Right rotation 50%  Left rotation 40%  (Blank rows = not tested)  STRENGTH: L thumb extension 3+/5, all other MMT elbow and distally wnl, shoulders not assessed, currently under care for R shoulder pain/ injury  LOWER EXTREMITY MMT: grossly wnl, 04/18/23:  reassessed and patient with pain with locked bridge l LE , pain R SI jt line  BED MOBILITY:  Sit to supine Modified independence Supine to sit Modified independence Rolling to Right Modified independence Rolling to Left Modified independence  TRANSFERS:all wnl RAMP:  na  CURB:  na  GAIT: Gait pattern: WFL Distance walked: in clinic over 50' Assistive device utilized: None Level of assistance: Complete Independence  SPECIAL TESTS:  SI provocative testing:  thigh thrust + R, faber's + R, march test + R  Unilateral stance wnl B   PATIENT SURVEYS:  DHI 47/80 or 42% deficit  VESTIBULAR ASSESSMENT:  GENERAL OBSERVATION: guarded movements, frequently closes eyes , slow movements, avoids neck rotation with movement transitions and resting posture   SYMPTOM BEHAVIOR:  Subjective history: 10 yr h.o vertigo, no previous Rx  Non-Vestibular symptoms: neck pain, nausea/vomiting, and numbness l side of mouth  Type of dizziness: Spinning/Vertigo and Lightheadedness/Faint  Frequency: daily  Duration: a few secs to several mins  Aggravating factors: Induced by position change: rolling to the right, rolling to the left, and supine to sit, Induced by motion: bending down to the ground, turning body quickly, and turning head quickly, Worse in the dark, and Moving eyes  Relieving factors: head stationary and closing eyes  Progression of symptoms: worse  OCULOMOTOR EXAM:  Ocular Alignment: normal  Ocular ROM: No Limitations  Spontaneous Nystagmus: right beating  Gaze-Induced Nystagmus: right beating with right gaze  Smooth Pursuits: saccades  Saccades: hypermetric/overshoots and extra eye movements  Convergence/Divergence:na today    Gaze-Induced Nystagmus: absent   Positional tests: Right Dix-Hallpike: no nystagmus Left Dix-Hallpike: no nystagmus Right Roll Test: symptoms worse on left side Left Roll Test: symptoms worse on left side  VESTIBULAR - OCULAR REFLEX: nt today  POSITIONAL TESTING: Right Dix-Hallpike: no nystagmus Left Dix-Hallpike: no nystagmus Right Roll Test: no nystagmus Left Roll Test: no nystagmus  MOTION SENSITIVITY:  Motion Sensitivity Quotient Intensity: 0 = none, 1 = Lightheaded, 2 = Mild, 3 = Moderate, 4 = Severe, 5 = Vomiting  Intensity  1. Sitting to supine   2. Supine to L side   3. Supine to R side   4. Supine to sitting   5. L Hallpike-Dix   6. Up from L    7. R Hallpike-Dix   8. Up  from R    9. Sitting, head tipped to L knee   10. Head up from L knee   11. Sitting, head tipped to R knee   12. Head up from R knee   13. Sitting head turns x5   14.Sitting head nods x5   15. In stance, 180 turn to L    16. In stance, 180 turn to R     OTHOSTATICS: not done  FUNCTIONAL GAIT:  na today   TREATMENT:  DATE: 05/07/23: Vestibular treatment: Assessed for and  + brief, 5 sec interval of nystagmus and vertigo in R Dix Halpike position, + for posterior canalithiasis.  Therefore utilized Epley maneuver, one cycle.  Then reassessed and no Sx.  Sx improved with supine to sit as well.  Offered to assess L post canal but pt deferred.  Did utilize dry needling r post hip per pts request:  Trigger Point Dry-Needling  Treatment instructions: Expect mild to moderate muscle soreness. S/S of pneumothorax if dry needled over a lung field, and to seek immediate medical attention should they occur. Patient verbalized understanding of these instructions and education. Patient Consent Given: Yes Education handout provided: Previously provided Muscles treated: r gluteals, minimus, medius, maximal, R TFL   Electrical stimulation performed: No Parameters: N/A Treatment response/outcome: Twitch Response Elicited and Palpable Increase in Muscle Length   Manual: gentle overpressure R post hip/gluteals following dry needling.    05/01/23 Manual Therapy: to decrease muscle spasm and pain and improve mobility STM/TPR to bil UT, L thoracic paraspinals and QL, L UPA mobs lower thoracic spine, R glutes/piriformis, skilled palpation and monitoring during dry needling.  IASTM with foam roller to glutes after dry needling for further muscle relaxation.  Trigger Point Dry-Needling  Treatment instructions: Expect mild to moderate muscle soreness. S/S of pneumothorax if dry needled over a lung field, and to seek  immediate medical attention should they occur. Patient verbalized understanding of these instructions and education. Patient Consent Given: Yes Education handout provided: Previously provided Muscles treated: L UT, R glut medius and piriformis Electrical stimulation performed: No Parameters: N/A Treatment response/outcome: Twitch Response Elicited and Palpable Increase in Muscle Length  04/18/23: Manual: prone for dry needling :Trigger Point Dry-Needling  Treatment instructions: Expect mild to moderate muscle soreness. S/S of pneumothorax if dry needled over a lung field, and to seek immediate medical attention should they occur. Patient verbalized understanding of these instructions and education. Patient Consent Given: Yes Education handout provided: Previously provided Muscles treated: R glut medius R TFL, R glut max Electrical stimulation performed: No Parameters: N/A Treatment response/outcome: Twitch Response Elicited and Palpable Increase in Muscle Length  Followed dry needling with  foam roller massage ., myofascial release R gluteals and LS region  Muscle energy for SI innominate, isometric resisted knee to chest B alternating , 5 sec holds, 3 reps each,  instructed patient in performance of same for home  Seated for isometric hip abd/ER 30 sec holds, 3 sets,  04/09/23: Manual: to decrease myofascial pain: Prone for Trigger Point Dry-Needling  Treatment instructions: Expect mild to moderate muscle soreness. S/S of pneumothorax if dry needled over a lung field, and to seek immediate medical attention should they occur. Patient verbalized understanding of these instructions and education. Patient Consent Given: Yes Education handout provided: Previously provided Muscles treated: R piriformis, R glut max, R glut med, R glut min Electrical stimulation performed: No Parameters: N/A Treatment response/outcome: Twitch Response Elicited and Palpable Increase in Muscle Length   After  dry needling, therapist assisted with several manual stretches for lumbar region, B knee to chest, alternating piriformis stretch, resisted isometric hip and knee extension, alternating, 3 sets 5 sec holds, hamstring stretch B  PATIENT EDUCATION: Education details: NA Person educated: Patient Education method: Explanation Education comprehension: verbalized understanding  HOME EXERCISE PROGRAM:  GOALS: Goals reviewed with patient? Yes  SHORT TERM GOALS: Target date: 03/12/23  Patient will report compliance with initial HEP.  Baseline:not initiated yet Goal status: IN PROGRESS  2.  Patient will report resolution of dizziness rolling over in bed.  Baseline: consistent dizziness Goal status: IN PROGRESS 03/13/23- improving  3.  Patient will complete further balance testing.  Baseline:  Goal status: IN PROGRESS LONG TERM GOALS: Target date: 06/13/23   1.Patient will be independent with progressed HEP to improve outcomes and carryover.  Baseline: not initiated yet Goal status: IN PROGRESS        2. Patient will demonstrate 26/30 score on FGA to decrease risk of falls.  Baseline: na yet Goal status: IN PROGRESS   Patient will report 56 or greater on DHI to demonstrate improved QOL. Baseline: 47 Goal status: IN PROGRESS Patient will be independent with initial HEP.  Baseline: given Goal status: MET - 11/21/22   LONG TERM GOALS for LS spine pain: Target date: 06/13/2023   Patient will be independent with advanced/ongoing HEP to improve outcomes and carryover.  Baseline:  Goal status: IN PROGRESS 01/31/23- good compliance, advanced today  2.  Patient will report 75% improvement in neck pain to improve QOL.  Baseline: severe Goal status: IN PROGRESS- 12/19/22 65% improvement 01/31/2023- 70% improvement.   3.  Patient will demonstrate improved cervical ROM by 10 deg all directions for safety with driving.  Baseline: see objective Goal status: IN PROGRESS-01/31/23- decreased due to  shoulder pain  4.  Patient will report 15% improvement on NDI to demonstrate improved functional ability.  Baseline: 46% Goal status: IN PROGRESS 01/31/23- 34%  5.  Patient will report 75% improvement in low back pain to improve quality of life. Baseline:   Goal status: MET- 11/27/22  60-70% ; 01/31/23- 75% 04/18/23:  lower back pain ebbs and flows, back today and last week due to pain  6. Patient will report 12% improvement on modified Oswestry to demonstrate improved functional ability. Baseline: 12/19/22 -30% Goal status: MET   01/31/23- 18% impairment. 04/18/23: 14/50 or 28% disability  7. Patient will will be able to return to community-based exercise program to maintain progress. Baseline: stopped exercising due to pain Goal status: IN PROGRESS 04/18/23 not exercising consistently   ASSESSMENT:  CLINICAL IMPRESSION: Sayra T Grulke reports overall improvement in symptoms , today again demonstrated + Sx for R sided BPPV for R posterior canal. Did experience dizziness in each position while moving through Epley, but did not wasn't assessment of L post canal today.  R gluteals  still quite tender, requested dry needling again, quite painful.    Grayson T Baze is going to continue, will reassess next visit for vestibular if needed or LS spine pain      OBJECTIVE IMPAIRMENTS: decreased activity tolerance, decreased ROM, dizziness, and postural dysfunction.   ACTIVITY LIMITATIONS: lifting, bending, sleeping, bed mobility, and reach over head  PARTICIPATION LIMITATIONS: cleaning, laundry, and community activity  PERSONAL FACTORS: Time since onset of injury/illness/exacerbation and 1-2 comorbidities: arthritis multiple sites,HTN  are also affecting patient's functional outcome.   REHAB POTENTIAL: Good  CLINICAL DECISION MAKING: Stable/uncomplicated  EVALUATION COMPLEXITY: Low   PLAN:  PT FREQUENCY: 1-2x/week  PT DURATION: 6 weeks  PLANNED INTERVENTIONS: Therapeutic exercises,  Therapeutic activity, Neuromuscular re-education, Balance training, Gait training, Patient/Family education, Self Care, Joint mobilization, and Canalith repositioning  PLAN FOR NEXT SESSION: reassess BPPV, perform VOR tests , further assess cervical spine   Donyae Kohn L Jillian Warth, PT, DPT  05/07/2023, 5:29 PM

## 2023-05-12 ENCOUNTER — Other Ambulatory Visit: Payer: Self-pay | Admitting: Family Medicine

## 2023-05-23 ENCOUNTER — Other Ambulatory Visit: Payer: Self-pay

## 2023-05-23 ENCOUNTER — Ambulatory Visit: Payer: 59

## 2023-05-23 DIAGNOSIS — R42 Dizziness and giddiness: Secondary | ICD-10-CM | POA: Diagnosis not present

## 2023-05-23 DIAGNOSIS — R293 Abnormal posture: Secondary | ICD-10-CM

## 2023-05-23 DIAGNOSIS — G8929 Other chronic pain: Secondary | ICD-10-CM

## 2023-05-23 DIAGNOSIS — I87393 Chronic venous hypertension (idiopathic) with other complications of bilateral lower extremity: Secondary | ICD-10-CM | POA: Diagnosis not present

## 2023-05-23 DIAGNOSIS — I872 Venous insufficiency (chronic) (peripheral): Secondary | ICD-10-CM | POA: Diagnosis not present

## 2023-05-23 DIAGNOSIS — H8113 Benign paroxysmal vertigo, bilateral: Secondary | ICD-10-CM

## 2023-05-23 DIAGNOSIS — M542 Cervicalgia: Secondary | ICD-10-CM | POA: Diagnosis not present

## 2023-05-23 DIAGNOSIS — M5441 Lumbago with sciatica, right side: Secondary | ICD-10-CM | POA: Diagnosis not present

## 2023-05-23 NOTE — Therapy (Signed)
OUTPATIENT PHYSICAL THERAPY TREATMENT   Patient Name: [name] MRN: [id number] DOB:10/23/1946, 77 y.o., female Today's Date: 05/23/2023  END OF SESSION:  PT End of Session - 05/23/23 1608     Visit Number 29    Date for PT Re-Evaluation 06/13/23    Progress Note Due on Visit 35    PT Start Time 1608    PT Stop Time 1700    PT Time Calculation (min) 52 min    Activity Tolerance Patient tolerated treatment well    Behavior During Therapy [facility] for tasks assessed/performed              Past Medical History:  Diagnosis Date   Acute MI, subendocardial (HCC)    Arthritis    Back pain 05/31/2013   Benign paroxysmal positional vertigo 08/05/2016   CAD (coronary artery disease)    a. NSTEMI 10/2011: LHC 11/19/11: pLAD 30%, oOM 60%, AVCFX 30%, CFX after OM2 30%, pRCA 60 and 70%, then 99%, AM 80-90% with TIMI 3 flow.  PCI: Promus DES x 2 to RCA; b. 06/2012 Cath: patent RCA stents w/ subtl occl of Acute Marginal (jailed)->Med rx; c. 05/2015 MV: EF 59%, mod mid infsept/inf/ap lat/ap infarct with peri-infarct isch-->Med Rx; d. 02/2016 Cath: LM nl, LAD 76m, RI 50, RCA patent stents.   Colitis    Facial skin lesion 01/28/2017   GERD (gastroesophageal reflux disease)    occasional   Hyperlipidemia    Hypertension    Hypertensive heart disease    a. Echocardiogram 11/19/11: Difficult acoustic windows, EF 60-65%, normal LV wall thickness, grade 1 diastolic dysfunction   Hypoparathyroidism (HCC)    Low zinc level 11/26/2016   Multinodular thyroid    Goiter s/p thyroidectomy in 2007 with post-op  hypocalcemia and post-op hypothyroidism/hypoparathyroidism with hypocalcemia   Neck pain on right side 07/13/2013   Nocturia 05/31/2013   Osteoarthritis    Pain in right axilla 03/13/2016   Palpitations    a. 03/2012 - patient set up for event monitor but did not wear correctly -  she declined wearing a repeat monitor   Post-surgical hypothyroidism    Sun-damaged skin 12/05/2014   Tubular adenoma of  colon 06/2011   Unspecified constipation 05/31/2013   Vertigo    Zinc deficiency 11/26/2015   Past Surgical History:  Procedure Laterality Date   CARDIAC CATHETERIZATION N/A 03/08/2016   Procedure: Left Heart Cath and Coronary Angiography;  Surgeon: Corky Crafts, MD;  Location: [facility] INVASIVE CV LAB;  Service: Cardiovascular;  Laterality: N/A;   COLONOSCOPY  Aenomatous polyps   07/05/2011   COLONOSCOPY N/A 09/28/2014   Procedure: COLONOSCOPY;  Surgeon: Meryl Dare, MD;  Location: [facility] ENDOSCOPY;  Service: Endoscopy;  Laterality: N/A;   CORONARY ANGIOPLASTY WITH STENT PLACEMENT     LEFT HEART CATH AND CORONARY ANGIOGRAPHY N/A 12/09/2018   Procedure: LEFT HEART CATH AND CORONARY ANGIOGRAPHY;  Surgeon: Corky Crafts, MD;  Location: [facility] INVASIVE CV LAB;  Service: Cardiovascular;  Laterality: N/A;   LEFT HEART CATHETERIZATION WITH CORONARY ANGIOGRAM N/A 07/17/2012   Procedure: LEFT HEART CATHETERIZATION WITH CORONARY ANGIOGRAM;  Surgeon: Herby Abraham, MD;  Location: [facility] CATH LAB;  Service: Cardiovascular;  Laterality: N/A;   PERCUTANEOUS CORONARY STENT INTERVENTION (PCI-S) N/A 11/19/2011   Procedure: PERCUTANEOUS CORONARY STENT INTERVENTION (PCI-S);  Surgeon: Herby Abraham, MD;  Location: [facility] CATH LAB;  Service: Cardiovascular;  Laterality: N/A;   POLYPECTOMY     SHOULDER ARTHROSCOPY W/ ROTATOR CUFF REPAIR  right   TOTAL THYROIDECTOMY  2007   GOITER   Patient Active Problem List   Diagnosis Date Noted   Sinus tarsi syndrome of left ankle 04/03/2023   Sinus tarsi syndrome of right ankle 02/18/2023   Edema 02/17/2023   Acute prerenal azotemia 02/06/2023   TIA (transient ischemic attack) 02/05/2023   Paresthesia 08/28/2022   Chronic neck pain 08/28/2022   Neuropathy 08/21/2022   Vitamin deficiency 10/23/2021   Hypoglycemia 10/07/2019   Heart disease 01/29/2019   Sensorineural hearing loss (SNHL) of both ears 01/27/2019   Primary osteoarthritis of both knees 12/19/2018    DDD (degenerative disc disease), lumbar 12/19/2018   ANA positive 10/27/2018   Anxiety 02/17/2018   Peripheral arterial disease (HCC) 02/20/2017   Skin lesion of face 01/28/2017   Benign paroxysmal positional vertigo 08/05/2016   Antiplatelet or antithrombotic long-term use 03/27/2016   Right lumbar radiculopathy 02/21/2016   Abnormality of gait 02/21/2016   Zinc deficiency 11/26/2015   Right hip pain 10/26/2015   Medicare annual wellness visit, subsequent 03/13/2015   Preventative health care 03/13/2015   Sun-damaged skin 12/05/2014   Angina of effort 12/02/2014   Hx of colonic polyps 09/28/2014   Benign neoplasm of descending colon 09/28/2014   Other fatigue 09/10/2014   Personal history of colonic polyps 07/16/2014   Bilateral thumb pain 03/20/2014   Postsurgical hypoparathyroidism (HCC) 08/03/2013   Neck pain on right side 07/13/2013   Constipation 05/31/2013   Back pain 05/31/2013   Nocturia 05/31/2013   GERD (gastroesophageal reflux disease) 11/02/2012   Tension headache 11/02/2012   Anemia 06/11/2012   Hypokalemia 06/11/2012   Depression 01/21/2012   Headache(784.0) 12/23/2011   Sinus bradycardia 12/07/2011   Hypocalcemia 12/07/2011   Acute MI, subendocardial (HCC) 11/20/2011   Acquired hypothyroidism 05/20/2009   Essential hypertension, benign 05/20/2009   Hyperlipidemia 03/08/2009   CAD, NATIVE VESSEL 03/08/2009    PCP: Danise Edge, MD REFERRING PROVIDER: Danise Edge, MD  REFERRING DIAG: BPPV  THERAPY DIAG:  Dizziness and giddiness  Cervicalgia  Chronic right-sided low back pain with right-sided sciatica  BPPV (benign paroxysmal positional vertigo), bilateral  Abnormal posture  ONSET DATE: 2014  Rationale for Evaluation and Treatment: Rehabilitation  SUBJECTIVE:   SUBJECTIVE STATEMENT: [name]  reports no more vertigo Rx  requests Rx of R post hip due to ongoing musculoskeletal pain.  PERTINENT HISTORY: h/o chronic vertigo,  referred to PT by PCP to assess for BPPV  PAIN:  Are you having pain? Yes: NPRS scale: 6/10 Pain location: right side lower back  Pain description: tender to touch Aggravating factors: twisting, bending Relieving factors: nothing  PRECAUTIONS: None  WEIGHT BEARING RESTRICTIONS: No  FALLS: Has patient fallen in last 6 months? No  LIVING ENVIRONMENT: Lives with: lives alone Lives in: House/apartment Stairs: No Has following equipment at home: None  PLOF: Independent  PATIENT GOALS: get rid of dizziness, nausea  OBJECTIVE:   DIAGNOSTIC FINDINGS: MRI at hospital recently brain and CT due to TIA/ stroke Sx , and both with negative results  COGNITION: Overall cognitive status: Within functional limits for tasks assessed   SENSATION: WFL  EDEMA:  None noted   POSTURE:  rounded shoulders, forward head, and increased thoracic kyphosis  Cervical ROM:    Active A/PROM (deg) eval  Flexion 70%  Extension 50%  Right lateral flexion   Left lateral flexion   Right rotation 50%  Left rotation 40%  (Blank rows = not tested)  STRENGTH: L thumb extension 3+/5,  all other MMT elbow and distally wnl, shoulders not assessed, currently under care for R shoulder pain/ injury  LOWER EXTREMITY MMT: grossly wnl, 04/18/23:  reassessed and patient with pain with locked bridge l LE , pain R SI jt line  BED MOBILITY:  Sit to supine Modified independence Supine to sit Modified independence Rolling to Right Modified independence Rolling to Left Modified independence  TRANSFERS:all wnl RAMP:  na  CURB:  na  GAIT: Gait pattern: WFL Distance walked: in clinic over 50' Assistive device utilized: None Level of assistance: Complete Independence  SPECIAL TESTS: SI provocative testing:  thigh thrust + R, faber's + R, march test + R  Unilateral stance wnl B   PATIENT SURVEYS:  DHI 47/80 or 42% deficit  VESTIBULAR ASSESSMENT:  GENERAL OBSERVATION: guarded movements, frequently  closes eyes , slow movements, avoids neck rotation with movement transitions and resting posture   SYMPTOM BEHAVIOR:  Subjective history: 10 yr h.o vertigo, no previous Rx  Non-Vestibular symptoms: neck pain, nausea/vomiting, and numbness l side of mouth  Type of dizziness: Spinning/Vertigo and Lightheadedness/Faint  Frequency: daily  Duration: a few secs to several mins  Aggravating factors: Induced by position change: rolling to the right, rolling to the left, and supine to sit, Induced by motion: bending down to the ground, turning body quickly, and turning head quickly, Worse in the dark, and Moving eyes  Relieving factors: head stationary and closing eyes  Progression of symptoms: worse  OCULOMOTOR EXAM:  Ocular Alignment: normal  Ocular ROM: No Limitations  Spontaneous Nystagmus: right beating  Gaze-Induced Nystagmus: right beating with right gaze  Smooth Pursuits: saccades  Saccades: hypermetric/overshoots and extra eye movements  Convergence/Divergence:na today    Gaze-Induced Nystagmus: absent   Positional tests: Right Dix-Hallpike: no nystagmus Left Dix-Hallpike: no nystagmus Right Roll Test: symptoms worse on left side Left Roll Test: symptoms worse on left side  VESTIBULAR - OCULAR REFLEX: nt today  POSITIONAL TESTING: Right Dix-Hallpike: no nystagmus Left Dix-Hallpike: no nystagmus Right Roll Test: no nystagmus Left Roll Test: no nystagmus  MOTION SENSITIVITY:  Motion Sensitivity Quotient Intensity: 0 = none, 1 = Lightheaded, 2 = Mild, 3 = Moderate, 4 = Severe, 5 = Vomiting  Intensity  1. Sitting to supine   2. Supine to L side   3. Supine to R side   4. Supine to sitting   5. L Hallpike-Dix   6. Up from L    7. R Hallpike-Dix   8. Up from R    9. Sitting, head tipped to L knee   10. Head up from L knee   11. Sitting, head tipped to R knee   12. Head up from R knee   13. Sitting head turns x5   14.Sitting head nods x5   15. In stance, 180 turn to L     16. In stance, 180 turn to R     OTHOSTATICS: not done  FUNCTIONAL GAIT:  na today   TREATMENT:  DATE: 05/23/23: Manual: Sidelying L for muscle energy to improve rotation and mobility of lumbar spine, 5 sec bouts, 5 reps Cross friction massage R quadratus lumborum Trigger Point Dry-Needling  Treatment instructions: Expect mild to moderate muscle soreness. S/S of pneumothorax if dry needled over a lung field, and to seek immediate medical attention should they occur. Patient verbalized understanding of these instructions and education. Patient Consent Given: Yes Education handout provided: No Muscles treated: R glut max Electrical stimulation performed: No Parameters: N/A Treatment response/outcome: Twitch Response Elicited and Palpable Increase in Muscle Length  Prone for estim with moist heat 15 min post Rx lumbar region/SI for pain relief     05/07/23: Vestibular treatment: Assessed for and  + brief, 5 sec interval of nystagmus and vertigo in R Dix Halpike position, + for posterior canalithiasis.  Therefore utilized Epley maneuver, one cycle.  Then reassessed and no Sx.  Sx improved with supine to sit as well.  Offered to assess L post canal but pt deferred.  Did utilize dry needling r post hip per pts request:  Trigger Point Dry-Needling  Treatment instructions: Expect mild to moderate muscle soreness. S/S of pneumothorax if dry needled over a lung field, and to seek immediate medical attention should they occur. Patient verbalized understanding of these instructions and education. Patient Consent Given: Yes Education handout provided: Previously provided Muscles treated: r gluteals, minimus, medius, maximal, R TFL   Electrical stimulation performed: No Parameters: N/A Treatment response/outcome: Twitch Response Elicited and Palpable Increase in Muscle Length   Manual: gentle  overpressure R post hip/gluteals following dry needling.    05/01/23 Manual Therapy: to decrease muscle spasm and pain and improve mobility STM/TPR to bil UT, L thoracic paraspinals and QL, L UPA mobs lower thoracic spine, R glutes/piriformis, skilled palpation and monitoring during dry needling.  IASTM with foam roller to glutes after dry needling for further muscle relaxation.  Trigger Point Dry-Needling  Treatment instructions: Expect mild to moderate muscle soreness. S/S of pneumothorax if dry needled over a lung field, and to seek immediate medical attention should they occur. Patient verbalized understanding of these instructions and education. Patient Consent Given: Yes Education handout provided: Previously provided Muscles treated: L UT, R glut medius and piriformis Electrical stimulation performed: No Parameters: N/A Treatment response/outcome: Twitch Response Elicited and Palpable Increase in Muscle Length  04/18/23: Manual: prone for dry needling :Trigger Point Dry-Needling  Treatment instructions: Expect mild to moderate muscle soreness. S/S of pneumothorax if dry needled over a lung field, and to seek immediate medical attention should they occur. Patient verbalized understanding of these instructions and education. Patient Consent Given: Yes Education handout provided: Previously provided Muscles treated: R glut medius R TFL, R glut max Electrical stimulation performed: No Parameters: N/A Treatment response/outcome: Twitch Response Elicited and Palpable Increase in Muscle Length  Followed dry needling with  foam roller massage ., myofascial release R gluteals and LS region  Muscle energy for SI innominate, isometric resisted knee to chest B alternating , 5 sec holds, 3 reps each,  instructed patient in performance of same for home  Seated for isometric hip abd/ER 30 sec holds, 3 sets,  04/09/23: Manual: to decrease myofascial pain: Prone for Trigger Point Dry-Needling   Treatment instructions: Expect mild to moderate muscle soreness. S/S of pneumothorax if dry needled over a lung field, and to seek immediate medical attention should they occur. Patient verbalized understanding of these instructions and education. Patient Consent Given: Yes Education handout provided: Previously provided Muscles treated:  R piriformis, R glut max, R glut med, R glut min Electrical stimulation performed: No Parameters: N/A Treatment response/outcome: Twitch Response Elicited and Palpable Increase in Muscle Length   After dry needling, therapist assisted with several manual stretches for lumbar region, B knee to chest, alternating piriformis stretch, resisted isometric hip and knee extension, alternating, 3 sets 5 sec holds, hamstring stretch B  PATIENT EDUCATION: Education details: NA Person educated: Patient Education method: Explanation Education comprehension: verbalized understanding  HOME EXERCISE PROGRAM:  GOALS: Goals reviewed with patient? Yes  SHORT TERM GOALS: Target date: 03/12/23  Patient will report compliance with initial HEP.  Baseline:not initiated yet Goal status: IN PROGRESS  2.  Patient will report resolution of dizziness rolling over in bed.  Baseline: consistent dizziness Goal status: IN PROGRESS 03/13/23- improving  3.  Patient will complete further balance testing.  Baseline:  Goal status: IN PROGRESS LONG TERM GOALS: Target date: 06/13/23   1.Patient will be independent with progressed HEP to improve outcomes and carryover.  Baseline: not initiated yet Goal status: IN PROGRESS        2. Patient will demonstrate 26/30 score on FGA to decrease risk of falls.  Baseline: na yet Goal status: IN PROGRESS   Patient will report 56 or greater on DHI to demonstrate improved QOL. Baseline: 47 Goal status: IN PROGRESS Patient will be independent with initial HEP.  Baseline: given Goal status: MET - 11/21/22   LONG TERM GOALS for LS spine  pain: Target date: 06/13/2023   Patient will be independent with advanced/ongoing HEP to improve outcomes and carryover.  Baseline:  Goal status: IN PROGRESS 01/31/23- good compliance, advanced today  2.  Patient will report 75% improvement in neck pain to improve QOL.  Baseline: severe Goal status: IN PROGRESS- 12/19/22 65% improvement 01/31/2023- 70% improvement.   3.  Patient will demonstrate improved cervical ROM by 10 deg all directions for safety with driving.  Baseline: see objective Goal status: IN PROGRESS-01/31/23- decreased due to shoulder pain  4.  Patient will report 15% improvement on NDI to demonstrate improved functional ability.  Baseline: 46% Goal status: IN PROGRESS 01/31/23- 34%  5.  Patient will report 75% improvement in low back pain to improve quality of life. Baseline:   Goal status: MET- 11/27/22  60-70% ; 01/31/23- 75% 04/18/23:  lower back pain ebbs and flows, back today and last week due to pain  6. Patient will report 12% improvement on modified Oswestry to demonstrate improved functional ability. Baseline: 12/19/22 -30% Goal status: MET   01/31/23- 18% impairment. 04/18/23: 14/50 or 28% disability  7. Patient will will be able to return to community-based exercise program to maintain progress. Baseline: stopped exercising due to pain Goal status: IN PROGRESS 04/18/23 not exercising consistently   ASSESSMENT:  CLINICAL IMPRESSION: Liv T Anselmo reports  R gluteals  still quite tender, requested dry needling again, quite painful. Poor tolerance to dry needling, stopped after 2 sites, instead utilized other manual techniques and e stim which she responded well to.     [name] is going to continue, will reassess next visit for vestibular if needed or LS spine pain      OBJECTIVE IMPAIRMENTS: decreased activity tolerance, decreased ROM, dizziness, and postural dysfunction.   ACTIVITY LIMITATIONS: lifting, bending, sleeping, bed mobility, and reach over  head  PARTICIPATION LIMITATIONS: cleaning, laundry, and community activity  PERSONAL FACTORS: Time since onset of injury/illness/exacerbation and 1-2 comorbidities: arthritis multiple sites,HTN  are also affecting patient's functional outcome.  REHAB POTENTIAL: Good  CLINICAL DECISION MAKING: Stable/uncomplicated  EVALUATION COMPLEXITY: Low   PLAN:  PT FREQUENCY: 1-2x/week  PT DURATION: 6 weeks  PLANNED INTERVENTIONS: Therapeutic exercises, Therapeutic activity, Neuromuscular re-education, Balance training, Gait training, Patient/Family education, Self Care, Joint mobilization, and Canalith repositioning  PLAN FOR NEXT SESSION: reassess BPPV, perform VOR tests , further assess cervical spine   Justis Dupas L Lauraine Crespo, PT, DPT  05/23/2023, 5:46 PM

## 2023-06-02 ENCOUNTER — Emergency Department (HOSPITAL_COMMUNITY)
Admission: EM | Admit: 2023-06-02 | Discharge: 2023-06-03 | Disposition: A | Discharge status: Home / Self Care | Payer: 59 | Attending: Emergency Medicine | Admitting: Emergency Medicine

## 2023-06-02 ENCOUNTER — Encounter (HOSPITAL_COMMUNITY): Payer: Self-pay | Admitting: Emergency Medicine

## 2023-06-02 ENCOUNTER — Other Ambulatory Visit: Payer: Self-pay

## 2023-06-02 DIAGNOSIS — I499 Cardiac arrhythmia, unspecified: Secondary | ICD-10-CM | POA: Diagnosis not present

## 2023-06-02 DIAGNOSIS — Z743 Need for continuous supervision: Secondary | ICD-10-CM | POA: Diagnosis not present

## 2023-06-02 DIAGNOSIS — R072 Precordial pain: Secondary | ICD-10-CM

## 2023-06-02 DIAGNOSIS — I2510 Atherosclerotic heart disease of native coronary artery without angina pectoris: Secondary | ICD-10-CM | POA: Insufficient documentation

## 2023-06-02 DIAGNOSIS — R6889 Other general symptoms and signs: Secondary | ICD-10-CM | POA: Diagnosis not present

## 2023-06-02 DIAGNOSIS — M25519 Pain in unspecified shoulder: Secondary | ICD-10-CM | POA: Diagnosis not present

## 2023-06-02 DIAGNOSIS — R079 Chest pain, unspecified: Secondary | ICD-10-CM | POA: Diagnosis not present

## 2023-06-02 DIAGNOSIS — I10 Essential (primary) hypertension: Secondary | ICD-10-CM | POA: Diagnosis not present

## 2023-06-02 DIAGNOSIS — R0789 Other chest pain: Secondary | ICD-10-CM | POA: Diagnosis present

## 2023-06-02 DIAGNOSIS — Z7982 Long term (current) use of aspirin: Secondary | ICD-10-CM | POA: Insufficient documentation

## 2023-06-02 NOTE — ED Triage Notes (Addendum)
Pt presents from home via EMS, for L shoulder and L chest pain starting 10pm at rest.  H/o MI with 2 stents, on plavix  EMS Exam: 324ASA and 1x nitro which improved CP from 8/10 to 0/10 but not shoulder pain. RBBB, HR 50s, 160/100.   Denies recent falls, SOB, N/V/D

## 2023-06-03 ENCOUNTER — Emergency Department (HOSPITAL_COMMUNITY): Payer: 59

## 2023-06-03 DIAGNOSIS — R072 Precordial pain: Secondary | ICD-10-CM | POA: Diagnosis not present

## 2023-06-03 DIAGNOSIS — R079 Chest pain, unspecified: Secondary | ICD-10-CM | POA: Diagnosis not present

## 2023-06-03 LAB — BASIC METABOLIC PANEL
Anion gap: 13 (ref 5–15)
BUN: 24 mg/dL — ABNORMAL HIGH (ref 8–23)
CO2: 24 mmol/L (ref 22–32)
Calcium: 10.1 mg/dL (ref 8.9–10.3)
Chloride: 101 mmol/L (ref 98–111)
Creatinine, Ser: 1.13 mg/dL — ABNORMAL HIGH (ref 0.44–1.00)
GFR, Estimated: 50 mL/min — ABNORMAL LOW (ref >60)
Glucose, Bld: 106 mg/dL — ABNORMAL HIGH (ref 70–99)
Potassium: 3.9 mmol/L (ref 3.5–5.1)
Sodium: 138 mmol/L (ref 135–145)

## 2023-06-03 LAB — CBC
HCT: 39.7 % (ref 36.0–46.0)
Hemoglobin: 13.1 g/dL (ref 12.0–15.0)
MCH: 29.3 pg (ref 26.0–34.0)
MCHC: 33 g/dL (ref 30.0–36.0)
MCV: 88.8 fL (ref 80.0–100.0)
Platelets: 256 10*3/uL (ref 150–400)
RBC: 4.47 MIL/uL (ref 3.87–5.11)
RDW: 13.1 % (ref 11.5–15.5)
WBC: 10.1 10*3/uL (ref 4.0–10.5)
nRBC: 0 % (ref 0.0–0.2)

## 2023-06-03 LAB — TROPONIN I (HIGH SENSITIVITY)
Troponin I (High Sensitivity): 7 ng/L (ref <18)
Troponin I (High Sensitivity): 8 ng/L (ref <18)

## 2023-06-03 NOTE — Discharge Instructions (Signed)
Lab workup and imaging were all reassuring please continue to take your home medications  Follow with your PCP doctor for further assessment  Come back to the emergency department if you develop chest pain, shortness of breath, severe abdominal pain, uncontrolled nausea, vomiting, diarrhea.

## 2023-06-03 NOTE — ED Provider Notes (Signed)
[place] [facility]ERGENCY DEPARTMENT AT [facility] Provider Note   CSN: [id number] Arrival date & time: 06/02/23  2350     History  Chief Complaint  Patient presents with   Chest Pain    [name] is a 77 y.o. female.  HPI   Patient with medical history including hypertension, hyperlipidemia, CAD, status post stent, presenting with complaints of chest pain.  Patient states it started suddenly about 2 hours ago, states while she was watching TV she had 1 episode of sharp chest pain, she states it went away immediately within started have some pain in her left arm, she states that she took aspirin and nitro states that the pain had improved but then she followed with pulling her left arm.  She states while in the ER her pain is completely resolved, she did not have associated shortness of breath, pleuritic chest pain, no near syncope.  No nausea or vomiting.  Patient states that she does not get exertional chest pain or shortness of breath, no worsening peripheral edema, been compliant with all of her medications.  She does not smoke.  No recent surgeries no long immobilization no history of PEs or DVTs.      Home Medications Prior to Admission medications   Medication Sig Start Date End Date Taking? Authorizing Provider  amLODipine (NORVASC) 10 MG tablet Take 1 tablet (10 mg total) by mouth daily. 05/13/23   Bradd Canary, MD  aspirin EC 81 MG tablet Take 81 mg by mouth daily. 11/22/11   Dunn, Dayna N, PA-C  B Complex-C (B-COMPLEX WITH VITAMIN C) tablet Take 1 tablet by mouth daily.    [provider]  bisoprolol (ZEBETA) 5 MG tablet Take 1 tablet (5 mg total) by mouth daily. 10/11/22   Pricilla Riffle, MD  calcitRIOL (ROCALTROL) 0.25 MCG capsule TAKE 3 CAPSULES BY MOUTH DAILY 12/31/22   Pricilla Riffle, MD  Cholecalciferol (VITAMIN D) 125 MCG (5000 UT) CAPS Take 5,000 Units by mouth daily in the afternoon. 01/17/23   Pricilla Riffle, MD  ciclopirox [facility]) 8 % solution  Apply topically at bedtime. Apply over nail and surrounding skin. Apply daily over previous coat. After seven (7) days, may remove with alcohol and continue cycle. 12/27/22   Standiford, Jenelle Mages, DPM  clopidogrel (PLAVIX) 75 MG tablet Take 1 tablet (75 mg total) by mouth daily. 04/02/23   Pricilla Riffle, MD  diazepam (VALIUM) 5 MG tablet Take 1 tablet (5 mg total) by mouth daily as needed for anxiety (MRI). Take 1 one hour prior to MRI and another when arrive 08/21/22   Bradd Canary, MD  furosemide (LASIX) 20 MG tablet 1 tab po daily prn severe pedal edema 02/11/23   Bradd Canary, MD  gabapentin (NEURONTIN) 100 MG capsule Take 2 capsules (200 mg total) by mouth at bedtime. 02/18/23   Judi Saa, DO  hydrALAZINE (APRESOLINE) 10 MG tablet TAKE 1/2 TABLET (5 MG) BY MOUTH AS NEEDED FOR BP ABOVE 160 OR GREATER 04/02/23   Pricilla Riffle, MD  isosorbide mononitrate (IMDUR) 60 MG 24 hr tablet Take 1 tablet (60 mg total) by mouth daily. 04/02/23   Pricilla Riffle, MD  levothyroxine (SYNTHROID) 125 MCG tablet TAKE 1 TABLET BY MOUTH DAILY  BEFORE BREAKFAST 03/27/23   Bradd Canary, MD  Multiple Vitamin (MULTIVITAMIN WITH MINERALS) TABS tablet Take 1 tablet by mouth daily.    [provider]  nitroGLYCERIN (NITROSTAT) 0.4 MG SL  tablet DISSOLVE 1 TABLET UNDER THE  TONGUE EVERY 5 MINUTES AS NEEDED FOR CHEST PAIN. MAX OF 3 TABLETS IN 15 MINUTES. CALL 911 IF PAIN  PERSISTS. 05/03/23   Pricilla Riffle, MD  pantoprazole (PROTONIX) 40 MG tablet Take 1 tablet (40 mg total) by mouth daily. 04/29/23   Bradd Canary, MD  REPATHA SURECLICK 140 MG/ML SOAJ INJECT 140MG  SUBCUTANEOUSLY  EVERY 2 WEEKS 02/28/23   Pricilla Riffle, MD  rosuvastatin (CRESTOR) 40 MG tablet Take 1 tablet (40 mg total) by mouth daily. 04/02/23   Pricilla Riffle, MD  urea (CARMOL) 10 % cream Apply topically as needed. 12/27/22   Standiford, Jenelle Mages, DPM      Allergies    Zetia [ezetimibe] and Penicillin g    Review of Systems   Review of Systems   Constitutional:  Negative for chills and fever.  Respiratory:  Negative for shortness of breath.   Cardiovascular:  Negative for chest pain.  Gastrointestinal:  Negative for abdominal pain.  Neurological:  Negative for headaches.    Physical Exam Updated Vital Signs BP (!) 156/58   Pulse (!) 53   Temp 98 F (36.7 C) (Oral)   Resp 16   Wt 85.7 kg   SpO2 96%   BMI 31.45 kg/m  Physical Exam Vitals and nursing note reviewed.  Constitutional:      General: She is not in acute distress.    Appearance: She is not ill-appearing.  HENT:     Head: Normocephalic and atraumatic.     Nose: No congestion.  Eyes:     Conjunctiva/sclera: Conjunctivae normal.  Cardiovascular:     Rate and Rhythm: Normal rate and regular rhythm.     Pulses: Normal pulses.     Heart sounds: No murmur heard.    No friction rub. No gallop.  Pulmonary:     Effort: No respiratory distress.     Breath sounds: No wheezing, rhonchi or rales.  Musculoskeletal:     Right lower leg: No edema.     Left lower leg: No edema.     Comments: No unilateral leg swelling no calf tenderness no palpable cords.  Skin:    General: Skin is warm and dry.  Neurological:     Mental Status: She is alert.  Psychiatric:        Mood and Affect: Mood normal.     ED Results / Procedures / Treatments   Labs (all labs ordered are listed, but only abnormal results are displayed) Labs Reviewed  BASIC METABOLIC PANEL - Abnormal; Notable for the following components:      Result Value   Glucose, Bld 106 (*)    BUN 24 (*)    Creatinine, Ser 1.13 (*)    GFR, Estimated 50 (*)    All other components within normal limits  CBC  TROPONIN I (HIGH SENSITIVITY)  TROPONIN I (HIGH SENSITIVITY)    EKG EKG Interpretation  Date/Time:  Sunday June 02 2023 23:55:07 EDT Ventricular Rate:  52 PR Interval:  186 QRS Duration: 148 QT Interval:  469 QTC Calculation: 437 R Axis:   28 Text Interpretation: Sinus rhythm Right bundle branch  block No significant change was found Confirmed by Glynn Octave [phone]) on 06/02/2023 11:57:43 PM  Radiology DG Chest 2 View  Result Date: 06/03/2023 CLINICAL DATA:  Left-sided chest pain. EXAM: CHEST - 2 VIEW COMPARISON:  October 29, 2022 FINDINGS: The heart size and mediastinal contours are within normal limits. Both lungs are  clear. The visualized skeletal structures are unremarkable. IMPRESSION: No active cardiopulmonary disease. Electronically Signed   By: Aram Candela M.D.   On: 06/03/2023 00:42    Procedures Procedures    Medications Ordered in ED Medications - No data to display  ED Course/ Medical Decision Making/ A&P                             Medical Decision Making Amount and/or Complexity of Data Reviewed Labs: ordered. Radiology: ordered.   This patient presents to the ED for concern of chest pain, this involves an extensive number of treatment options, and is a complaint that carries with it a high risk of complications and morbidity.  The differential diagnosis includes ACS, PE, angina, CHF    Additional history obtained:  Additional history obtained from N/A External records from outside source obtained and reviewed including allergy notes   Co morbidities that complicate the patient evaluation  CAD , hypertension, hyperlipidemia  Social Determinants of Health:  N/a    Lab Tests:  I Ordered, and personally interpreted labs.  The pertinent results include: CBC unremarkable, BMP reveals glucose of 106 BUN 24 creatinine 1.13, GFR 50, troponin is negative, second troponin is negative   Imaging Studies ordered:  I ordered imaging studies including Chest x-ray  I independently visualized and interpreted imaging which showed without signs of ischemia I agree with the radiologist interpretation   Cardiac Monitoring:  The patient was maintained on a cardiac monitor.  I personally viewed and interpreted the cardiac monitored which showed an  underlying rhythm of: Without signs of ischemia   Medicines ordered and prescription drug management:  I ordered medication including n/a I have reviewed the patients home medicines and have made adjustments as needed  Critical Interventions:  N/A   Reevaluation:  Presents with chest pain which is since resolved, will obtain screening lab workup imaging and reassess.  Reassessed having no complaints vital signs reassuring patient is in agreement with discharge at this time.    Consultations Obtained:  N/A    Test Considered:  N/A    Rule out I have low suspicion for ACS as history is atypical, EKG was sinus rhythm without signs of ischemia, patient has a negative delta troponin.  Low suspicion for PE as patient denies pleuritic chest pain, shortness of breath, patient denies leg pain, no pedal edema noted on exam, patient vital signs reassuring nontachypneic nonhypoxic.  Doubt unstable angina pain is not worsened with exertion, presentation is atypical.  Low suspicion for AAA or aortic dissection as history is atypical, patient has low risk factors.  Low suspicion for systemic infection as patient is nontoxic-appearing, vital signs reassuring, no obvious source infection noted on exam.    Dispostion and problem list  After consideration of the diagnostic results and the patients response to treatment, I feel that the patent would benefit from discharge.  Precordial chest pain-since resolved, suspicion is likely musculoskeletal, will have to continue with all her medications, follow-up PCP for further assessment strict return precautions.            Final Clinical Impression(s) / ED Diagnoses Final diagnoses:  Precordial chest pain    Rx / DC Orders ED Discharge Orders     None         Carroll Sage, PA-C 06/03/23 0630    Glynn Octave, MD 06/03/23 [phone]

## 2023-06-03 NOTE — ED Notes (Signed)
Patient verbalizes understanding of discharge instructions. Opportunity for questioning and answers were provided. Armband removed by staff, pt discharged from ED to home via cab ?

## 2023-06-04 ENCOUNTER — Encounter: Payer: Self-pay | Admitting: Physical Therapy

## 2023-06-04 ENCOUNTER — Ambulatory Visit: Payer: 59 | Attending: Family Medicine | Admitting: Physical Therapy

## 2023-06-04 DIAGNOSIS — R42 Dizziness and giddiness: Secondary | ICD-10-CM | POA: Diagnosis not present

## 2023-06-04 DIAGNOSIS — G8929 Other chronic pain: Secondary | ICD-10-CM | POA: Insufficient documentation

## 2023-06-04 DIAGNOSIS — M542 Cervicalgia: Secondary | ICD-10-CM | POA: Diagnosis not present

## 2023-06-04 DIAGNOSIS — H8113 Benign paroxysmal vertigo, bilateral: Secondary | ICD-10-CM | POA: Insufficient documentation

## 2023-06-04 DIAGNOSIS — M5441 Lumbago with sciatica, right side: Secondary | ICD-10-CM | POA: Diagnosis not present

## 2023-06-04 NOTE — Therapy (Signed)
OUTPATIENT PHYSICAL THERAPY TREATMENT   Patient Name: [name] MRN: [id number] DOB:June 17, 1946, 77 y.o., female Today's Date: 06/04/2023  END OF SESSION:  PT End of Session - 06/04/23 1443     Visit Number 30    Date for PT Re-Evaluation 06/13/23    Authorization Type [employer] Medicare    Progress Note Due on Visit 35    PT Start Time 1401    PT Stop Time 1455    PT Time Calculation (min) 54 min    Activity Tolerance Patient tolerated treatment well    Behavior During Therapy WFL for tasks assessed/performed               Past Medical History:  Diagnosis Date   Acute MI, subendocardial (HCC)    Arthritis    Back pain 05/31/2013   Benign paroxysmal positional vertigo 08/05/2016   CAD (coronary artery disease)    a. NSTEMI 10/2011: LHC 11/19/11: pLAD 30%, oOM 60%, AVCFX 30%, CFX after OM2 30%, pRCA 60 and 70%, then 99%, AM 80-90% with TIMI 3 flow.  PCI: Promus DES x 2 to RCA; b. 06/2012 Cath: patent RCA stents w/ subtl occl of Acute Marginal (jailed)->Med rx; c. 05/2015 MV: EF 59%, mod mid infsept/inf/ap lat/ap infarct with peri-infarct isch-->Med Rx; d. 02/2016 Cath: LM nl, LAD 45m, RI 50, RCA patent stents.   Colitis    Facial skin lesion 01/28/2017   GERD (gastroesophageal reflux disease)    occasional   Hyperlipidemia    Hypertension    Hypertensive heart disease    a. Echocardiogram 11/19/11: Difficult acoustic windows, EF 60-65%, normal LV wall thickness, grade 1 diastolic dysfunction   Hypoparathyroidism (HCC)    Low zinc level 11/26/2016   Multinodular thyroid    Goiter s/p thyroidectomy in 2007 with post-op  hypocalcemia and post-op hypothyroidism/hypoparathyroidism with hypocalcemia   Neck pain on right side 07/13/2013   Nocturia 05/31/2013   Osteoarthritis    Pain in right axilla 03/13/2016   Palpitations    a. 03/2012 - patient set up for event monitor but did not wear correctly -  she declined wearing a repeat monitor   Post-surgical hypothyroidism    Sun-damaged  skin 12/05/2014   Tubular adenoma of colon 06/2011   Unspecified constipation 05/31/2013   Vertigo    Zinc deficiency 11/26/2015   Past Surgical History:  Procedure Laterality Date   CARDIAC CATHETERIZATION N/A 03/08/2016   Procedure: Left Heart Cath and Coronary Angiography;  Surgeon: Corky Crafts, MD;  Location: [facility] INVASIVE CV LAB;  Service: Cardiovascular;  Laterality: N/A;   COLONOSCOPY  Aenomatous polyps   07/05/2011   COLONOSCOPY N/A 09/28/2014   Procedure: COLONOSCOPY;  Surgeon: Meryl Dare, MD;  Location: [facility] ENDOSCOPY;  Service: Endoscopy;  Laterality: N/A;   CORONARY ANGIOPLASTY WITH STENT PLACEMENT     LEFT HEART CATH AND CORONARY ANGIOGRAPHY N/A 12/09/2018   Procedure: LEFT HEART CATH AND CORONARY ANGIOGRAPHY;  Surgeon: Corky Crafts, MD;  Location: [facility] INVASIVE CV LAB;  Service: Cardiovascular;  Laterality: N/A;   LEFT HEART CATHETERIZATION WITH CORONARY ANGIOGRAM N/A 07/17/2012   Procedure: LEFT HEART CATHETERIZATION WITH CORONARY ANGIOGRAM;  Surgeon: Herby Abraham, MD;  Location: [facility] CATH LAB;  Service: Cardiovascular;  Laterality: N/A;   PERCUTANEOUS CORONARY STENT INTERVENTION (PCI-S) N/A 11/19/2011   Procedure: PERCUTANEOUS CORONARY STENT INTERVENTION (PCI-S);  Surgeon: Herby Abraham, MD;  Location: [facility] CATH LAB;  Service: Cardiovascular;  Laterality: N/A;   POLYPECTOMY     SHOULDER ARTHROSCOPY  W/ ROTATOR CUFF REPAIR     right   TOTAL THYROIDECTOMY  2007   GOITER   Patient Active Problem List   Diagnosis Date Noted   Sinus tarsi syndrome of left ankle 04/03/2023   Sinus tarsi syndrome of right ankle 02/18/2023   Edema 02/17/2023   Acute prerenal azotemia 02/06/2023   TIA (transient ischemic attack) 02/05/2023   Paresthesia 08/28/2022   Chronic neck pain 08/28/2022   Neuropathy 08/21/2022   Vitamin deficiency 10/23/2021   Hypoglycemia 10/07/2019   Heart disease 01/29/2019   Sensorineural hearing loss (SNHL) of both ears 01/27/2019   Primary  osteoarthritis of both knees 12/19/2018   DDD (degenerative disc disease), lumbar 12/19/2018   ANA positive 10/27/2018   Anxiety 02/17/2018   Peripheral arterial disease (HCC) 02/20/2017   Skin lesion of face 01/28/2017   Benign paroxysmal positional vertigo 08/05/2016   Antiplatelet or antithrombotic long-term use 03/27/2016   Right lumbar radiculopathy 02/21/2016   Abnormality of gait 02/21/2016   Zinc deficiency 11/26/2015   Right hip pain 10/26/2015   Medicare annual wellness visit, subsequent 03/13/2015   Preventative health care 03/13/2015   Sun-damaged skin 12/05/2014   Angina of effort 12/02/2014   Hx of colonic polyps 09/28/2014   Benign neoplasm of descending colon 09/28/2014   Other fatigue 09/10/2014   Personal history of colonic polyps 07/16/2014   Bilateral thumb pain 03/20/2014   Postsurgical hypoparathyroidism (HCC) 08/03/2013   Neck pain on right side 07/13/2013   Constipation 05/31/2013   Back pain 05/31/2013   Nocturia 05/31/2013   GERD (gastroesophageal reflux disease) 11/02/2012   Tension headache 11/02/2012   Anemia 06/11/2012   Hypokalemia 06/11/2012   Depression 01/21/2012   Headache(784.0) 12/23/2011   Sinus bradycardia 12/07/2011   Hypocalcemia 12/07/2011   Acute MI, subendocardial (HCC) 11/20/2011   Acquired hypothyroidism 05/20/2009   Essential hypertension, benign 05/20/2009   Hyperlipidemia 03/08/2009   CAD, NATIVE VESSEL 03/08/2009    PCP: Danise Edge, MD REFERRING PROVIDER: Danise Edge, MD  REFERRING DIAG: BPPV  THERAPY DIAG:  Chronic right-sided low back pain with right-sided sciatica  Dizziness and giddiness  Cervicalgia  BPPV (benign paroxysmal positional vertigo), bilateral  ONSET DATE: 2014  Rationale for Evaluation and Treatment: Rehabilitation  SUBJECTIVE:   SUBJECTIVE STATEMENT: Naiomi T Rice reports vertigo is ongoing but last time she was in bed for 2 days, but she would like to defer treatment right now.   Next month her daughter is visiting, she could driver her, she would like to address it again then.  Her back has been hurting her a lot, not sleeping well last couple of nights.    PERTINENT HISTORY: h/o chronic vertigo, referred to PT by PCP to assess for BPPV  PAIN:  Are you having pain? Yes: NPRS scale: 6/10 Pain location: right side lower back  Pain description: tender to touch Aggravating factors: twisting, bending Relieving factors: nothing  PRECAUTIONS: None  WEIGHT BEARING RESTRICTIONS: No  FALLS: Has patient fallen in last 6 months? No  LIVING ENVIRONMENT: Lives with: lives alone Lives in: House/apartment Stairs: No Has following equipment at home: None  PLOF: Independent  PATIENT GOALS: get rid of dizziness, nausea  OBJECTIVE:   DIAGNOSTIC FINDINGS: MRI at hospital recently brain and CT due to TIA/ stroke Sx , and both with negative results  COGNITION: Overall cognitive status: Within functional limits for tasks assessed   SENSATION: WFL  EDEMA:  None noted   POSTURE:  rounded shoulders, forward head, and increased thoracic  kyphosis  Cervical ROM:    Active A/PROM (deg) eval  Flexion 70%  Extension 50%  Right lateral flexion   Left lateral flexion   Right rotation 50%  Left rotation 40%  (Blank rows = not tested)  STRENGTH: L thumb extension 3+/5, all other MMT elbow and distally wnl, shoulders not assessed, currently under care for R shoulder pain/ injury  LOWER EXTREMITY MMT: grossly wnl, 04/18/23:  reassessed and patient with pain with locked bridge l LE , pain R SI jt line  BED MOBILITY:  Sit to supine Modified independence Supine to sit Modified independence Rolling to Right Modified independence Rolling to Left Modified independence  TRANSFERS:all wnl RAMP:  na  CURB:  na  GAIT: Gait pattern: WFL Distance walked: in clinic over 50' Assistive device utilized: None Level of assistance: Complete Independence  SPECIAL TESTS: SI  provocative testing:  thigh thrust + R, faber's + R, march test + R  Unilateral stance wnl B   PATIENT SURVEYS:  DHI 47/80 or 42% deficit  VESTIBULAR ASSESSMENT:  GENERAL OBSERVATION: guarded movements, frequently closes eyes , slow movements, avoids neck rotation with movement transitions and resting posture   SYMPTOM BEHAVIOR:  Subjective history: 10 yr h.o vertigo, no previous Rx  Non-Vestibular symptoms: neck pain, nausea/vomiting, and numbness l side of mouth  Type of dizziness: Spinning/Vertigo and Lightheadedness/Faint  Frequency: daily  Duration: a few secs to several mins  Aggravating factors: Induced by position change: rolling to the right, rolling to the left, and supine to sit, Induced by motion: bending down to the ground, turning body quickly, and turning head quickly, Worse in the dark, and Moving eyes  Relieving factors: head stationary and closing eyes  Progression of symptoms: worse  OCULOMOTOR EXAM:  Ocular Alignment: normal  Ocular ROM: No Limitations  Spontaneous Nystagmus: right beating  Gaze-Induced Nystagmus: right beating with right gaze  Smooth Pursuits: saccades  Saccades: hypermetric/overshoots and extra eye movements  Convergence/Divergence:na today    Gaze-Induced Nystagmus: absent   Positional tests: Right Dix-Hallpike: no nystagmus Left Dix-Hallpike: no nystagmus Right Roll Test: symptoms worse on left side Left Roll Test: symptoms worse on left side  VESTIBULAR - OCULAR REFLEX: nt today  POSITIONAL TESTING: Right Dix-Hallpike: no nystagmus Left Dix-Hallpike: no nystagmus Right Roll Test: no nystagmus Left Roll Test: no nystagmus  MOTION SENSITIVITY:  Motion Sensitivity Quotient Intensity: 0 = none, 1 = Lightheaded, 2 = Mild, 3 = Moderate, 4 = Severe, 5 = Vomiting  Intensity  1. Sitting to supine   2. Supine to L side   3. Supine to R side   4. Supine to sitting   5. L Hallpike-Dix   6. Up from L    7. R Hallpike-Dix   8. Up from R     9. Sitting, head tipped to L knee   10. Head up from L knee   11. Sitting, head tipped to R knee   12. Head up from R knee   13. Sitting head turns x5   14.Sitting head nods x5   15. In stance, 180 turn to L    16. In stance, 180 turn to R     OTHOSTATICS: not done  FUNCTIONAL GAIT:  na today   TREATMENT:  DATE:   06/04/2023 Therapeutic Exercise: to improve strength and mobility.  Demo, verbal and tactile cues throughout for technique.  Vitals 110/62 LTR - focusing on side of preference SKTC - focusing on side of preference MET with resisted R hip extension 5 x 5 sec holds Isometric hip adduction/abduction 5 x 5 sec holds Manual Therapy: to decrease muscle spasm and pain and improve mobility IASTM to R glutes, QL, lumbar paraspinals, R UPA mobs lumbar spine grade 2-3, pin & Stretch to R piriformis Modalities: Estim - Premod to R piriformis and R lumbar erector spinae x 15 min with MHP to decrease muscle spasm and pain.     05/23/23: Manual: Sidelying L for muscle energy to improve rotation and mobility of lumbar spine, 5 sec bouts, 5 reps Cross friction massage R quadratus lumborum Trigger Point Dry-Needling  Treatment instructions: Expect mild to moderate muscle soreness. S/S of pneumothorax if dry needled over a lung field, and to seek immediate medical attention should they occur. Patient verbalized understanding of these instructions and education. Patient Consent Given: Yes Education handout provided: No Muscles treated: R glut max Electrical stimulation performed: No Parameters: N/A Treatment response/outcome: Twitch Response Elicited and Palpable Increase in Muscle Length  Prone for estim with moist heat 15 min post Rx lumbar region/SI for pain relief     05/07/23: Vestibular treatment: Assessed for and  + brief, 5 sec interval of nystagmus and vertigo in R Dix  Halpike position, + for posterior canalithiasis.  Therefore utilized Epley maneuver, one cycle.  Then reassessed and no Sx.  Sx improved with supine to sit as well.  Offered to assess L post canal but pt deferred.  Did utilize dry needling r post hip per pts request:  Trigger Point Dry-Needling  Treatment instructions: Expect mild to moderate muscle soreness. S/S of pneumothorax if dry needled over a lung field, and to seek immediate medical attention should they occur. Patient verbalized understanding of these instructions and education. Patient Consent Given: Yes Education handout provided: Previously provided Muscles treated: r gluteals, minimus, medius, maximal, R TFL   Electrical stimulation performed: No Parameters: N/A Treatment response/outcome: Twitch Response Elicited and Palpable Increase in Muscle Length   Manual: gentle overpressure R post hip/gluteals following dry needling.    PATIENT EDUCATION: Education details: NA Person educated: Patient Education method: Explanation Education comprehension: verbalized understanding  HOME EXERCISE PROGRAM:  GOALS: Goals reviewed with patient? Yes  SHORT TERM GOALS: Target date: 03/12/23  Patient will report compliance with initial HEP.  Baseline:not initiated yet Goal status: IN PROGRESS  2.  Patient will report resolution of dizziness rolling over in bed.  Baseline: consistent dizziness Goal status: IN PROGRESS 03/13/23- improving  3.  Patient will complete further balance testing.  Baseline:  Goal status: IN PROGRESS LONG TERM GOALS: Target date: 06/13/23   1.Patient will be independent with progressed HEP to improve outcomes and carryover.  Baseline: not initiated yet Goal status: IN PROGRESS        2. Patient will demonstrate 26/30 score on FGA to decrease risk of falls.  Baseline: na yet Goal status: IN PROGRESS   Patient will report 56 or greater on DHI to demonstrate improved QOL. Baseline: 47 Goal status: IN  PROGRESS Patient will be independent with initial HEP.  Baseline: given Goal status: MET - 11/21/22   LONG TERM GOALS for LS spine pain: Target date: 06/13/2023   Patient will be independent with advanced/ongoing HEP to improve outcomes and carryover.  Baseline:  Goal  status: IN PROGRESS 01/31/23- good compliance, advanced today  2.  Patient will report 75% improvement in neck pain to improve QOL.  Baseline: severe Goal status: IN PROGRESS- 12/19/22 65% improvement 01/31/2023- 70% improvement.   3.  Patient will demonstrate improved cervical ROM by 10 deg all directions for safety with driving.  Baseline: see objective Goal status: IN PROGRESS-01/31/23- decreased due to shoulder pain  4.  Patient will report 15% improvement on NDI to demonstrate improved functional ability.  Baseline: 46% Goal status: IN PROGRESS 01/31/23- 34%  5.  Patient will report 75% improvement in low back pain to improve quality of life. Baseline:   Goal status: MET- 11/27/22  60-70% ; 01/31/23- 75% 04/18/23:  lower back pain ebbs and flows, back today and last week due to pain  6. Patient will report 12% improvement on modified Oswestry to demonstrate improved functional ability. Baseline: 12/19/22 -30% Goal status: MET   01/31/23- 18% impairment. 04/18/23: 14/50 or 28% disability  7. Patient will will be able to return to community-based exercise program to maintain progress. Baseline: stopped exercising due to pain Goal status: IN PROGRESS 04/18/23 not exercising consistently   ASSESSMENT:  CLINICAL IMPRESSION: Aelyn T Bittel again reports pain in R low back and gluteal region with positive supine to long sit test, corrected with muscle energy technique.  Reviewed exercises, introduced TMR techniques focusing on side of preference to shorten tight muscles to relax without pain when pain is elevated.  Manual therapy followed by estim to further decrease pain.  Declined TrDN as too painful.   Reported significant  pain relief following interventions today.  Discussed considering purchasing TENS for future since she does like Estim.   Roxene T Gonzalo continues to demonstrate potential for improvement and would benefit from continued skilled therapy to address impairments.  Deferring vestibular treatment at this time until driver again available as reports makes her quite dizzy.     OBJECTIVE IMPAIRMENTS: decreased activity tolerance, decreased ROM, dizziness, and postural dysfunction.   ACTIVITY LIMITATIONS: lifting, bending, sleeping, bed mobility, and reach over head  PARTICIPATION LIMITATIONS: cleaning, laundry, and community activity  PERSONAL FACTORS: Time since onset of injury/illness/exacerbation and 1-2 comorbidities: arthritis multiple sites,HTN  are also affecting patient's functional outcome.   REHAB POTENTIAL: Good  CLINICAL DECISION MAKING: Stable/uncomplicated  EVALUATION COMPLEXITY: Low   PLAN:  PT FREQUENCY: 1-2x/week  PT DURATION: 6 weeks  PLANNED INTERVENTIONS: Therapeutic exercises, Therapeutic activity, Neuromuscular re-education, Balance training, Gait training, Patient/Family education, Self Care, Joint mobilization, and Canalith repositioning  PLAN FOR NEXT SESSION: do progress note and reassess LBP, reassess BPPV, perform VOR tests , further assess cervical spine, set new LB goals.    Jena Gauss, PT, DPT  06/04/2023, 3:12 PM

## 2023-06-05 ENCOUNTER — Telehealth: Payer: Self-pay | Admitting: *Deleted

## 2023-06-05 ENCOUNTER — Telehealth: Payer: Self-pay | Admitting: Internal Medicine

## 2023-06-05 NOTE — Telephone Encounter (Signed)
Pt called in to sch ED f/u. Pt declined seeing APP. Informed her Dr. [name] is booked out right now till October but she is already scheduled for August. I have also added her to waitlist but she requested to speak to nurse about it.

## 2023-06-05 NOTE — Transitions of Care (Post Inpatient/ED Visit) (Signed)
06/05/2023  Name: [name] MRN: [id number] DOB: [birth year]  Today's TOC FU Call Status: Today's TOC FU Call Status:: Successful TOC FU Call Competed TOC FU Call Complete Date: 06/05/23  Transition Care Management Follow-up Telephone Call Date of Discharge: 06/03/23 Discharge Facility: [facility]) Type of Discharge: Emergency Department Reason for ED Visit: Other: (Pericordial chest pain) How have you been since you were released from the hospital?: Better Any questions or concerns?: No  Items Reviewed: Did you receive and understand the discharge instructions provided?: Yes Medications obtained,verified, and reconciled?: Yes (Medications Reviewed) Any new allergies since your discharge?: No Dietary orders reviewed?: No Do you have support at home?: Yes People in Home: alone Name of Support/Comfort Primary Source: Yousef  Medications Reviewed Today: Medications Reviewed Today     Reviewed by Luella Cook, RN (Case Manager) on 06/05/23 at 1627  Med List Status: <None>   Medication Order Taking? Sig Documenting Provider Last Dose Status Informant  amLODipine (NORVASC) 10 MG tablet [id number] Yes Take 1 tablet (10 mg total) by mouth daily. Bradd Canary, MD Taking Active   aspirin EC 81 MG tablet [id number] Yes Take 81 mg by mouth daily. Laurann Montana, PA-C Taking Active Self  B Complex-C (B-COMPLEX WITH VITAMIN C) tablet [id number] Yes Take 1 tablet by mouth daily. [provider] Taking Active Self  bisoprolol (ZEBETA) 5 MG tablet [id number] Yes Take 1 tablet (5 mg total) by mouth daily. Pricilla Riffle, MD Taking Active   calcitRIOL (ROCALTROL) 0.25 MCG capsule [id number] Yes TAKE 3 CAPSULES BY MOUTH DAILY Pricilla Riffle, MD Taking Active   Cholecalciferol (VITAMIN D) 125 MCG (5000 UT) CAPS [id number] Yes Take 5,000 Units by mouth daily in the afternoon. Pricilla Riffle, MD Taking Active   ciclopirox [facility]) 8 % solution [id number] Yes Apply topically at  bedtime. Apply over nail and surrounding skin. Apply daily over previous coat. After seven (7) days, may remove with alcohol and continue cycle. Standiford, Jenelle Mages, DPM Taking Active   clopidogrel (PLAVIX) 75 MG tablet [id number] Yes Take 1 tablet (75 mg total) by mouth daily. Pricilla Riffle, MD Taking Active   diazepam (VALIUM) 5 MG tablet [id number] Yes Take 1 tablet (5 mg total) by mouth daily as needed for anxiety (MRI). Take 1 one hour prior to MRI and another when arrive Bradd Canary, MD Taking Active   furosemide (LASIX) 20 MG tablet [id number] Yes 1 tab po daily prn severe pedal edema Bradd Canary, MD Taking Active   gabapentin (NEURONTIN) 100 MG capsule [id number] Yes Take 2 capsules (200 mg total) by mouth at bedtime. Judi Saa, DO Taking Active   hydrALAZINE (APRESOLINE) 10 MG tablet [id number] Yes TAKE 1/2 TABLET (5 MG) BY MOUTH AS NEEDED FOR BP ABOVE 160 OR GREATER Pricilla Riffle, MD Taking Active   isosorbide mononitrate (IMDUR) 60 MG 24 hr tablet [id number] Yes Take 1 tablet (60 mg total) by mouth daily. Pricilla Riffle, MD Taking Active   levothyroxine (SYNTHROID) 125 MCG tablet [id number] Yes TAKE 1 TABLET BY MOUTH DAILY  BEFORE BREAKFAST Bradd Canary, MD Taking Active   Multiple Vitamin (MULTIVITAMIN WITH MINERALS) TABS tablet [id number] Yes Take 1 tablet by mouth daily. [provider] Taking Active Self  nitroGLYCERIN (NITROSTAT) 0.4 MG SL tablet [id number] Yes DISSOLVE 1 TABLET UNDER THE  TONGUE EVERY 5 MINUTES AS NEEDED FOR CHEST PAIN. MAX OF 3 TABLETS IN 15 MINUTES. CALL 911 IF PAIN  PERSISTS.  Pricilla Riffle, MD Taking Active   pantoprazole (PROTONIX) 40 MG tablet [id number] Yes Take 1 tablet (40 mg total) by mouth daily. Bradd Canary, MD Taking Active   REPATHA SURECLICK 140 MG/ML Ivory Broad [id number] Yes INJECT 140MG  SUBCUTANEOUSLY  EVERY 2 WEEKS Pricilla Riffle, MD Taking Active   rosuvastatin (CRESTOR) 40 MG tablet [id number] Yes Take 1 tablet (40 mg total) by mouth  daily. Pricilla Riffle, MD Taking Active   urea (CARMOL) 10 % cream [id number] Yes Apply topically as needed. Standiford, Jenelle Mages, DPM Taking Active             Home Care and Equipment/Supplies: Were Home Health Services Ordered?: NA Any new equipment or medical supplies ordered?: NA  Functional Questionnaire: Do you need assistance with bathing/showering or dressing?: No Do you need assistance with meal preparation?: No Do you need assistance with eating?: No Do you have difficulty maintaining continence: No Do you need assistance with getting out of bed/getting out of a chair/moving?: No Do you have difficulty managing or taking your medications?: No  Follow up appointments reviewed: PCP Follow-up appointment confirmed?: Yes Date of PCP follow-up appointment?: 06/07/23 Follow-up Provider: Hyman Hopes [address] Specialist [facility] Follow-up appointment confirmed?: No Reason Specialist Follow-Up Not Confirmed: Patient has Specialist Provider Number and will Call for Appointment Do you need transportation to your follow-up appointment?: No Do you understand care options if your condition(s) worsen?: Yes-patient verbalized understanding  SDOH Interventions Today    Flowsheet Row Most Recent Value  SDOH Interventions   Food Insecurity Interventions Intervention Not Indicated  Housing Interventions Intervention Not Indicated  Transportation Interventions Intervention Not Indicated      Interventions Today    Flowsheet Row Most Recent Value  General Interventions   General Interventions Discussed/Reviewed General Interventions Discussed, General Interventions Reviewed, Doctor Visits  Doctor Visits Discussed/Reviewed Doctor Visits Discussed, Doctor Visits Reviewed  Pharmacy Interventions   Pharmacy Dicussed/Reviewed Pharmacy Topics Discussed      TOC Interventions Today    Flowsheet Row Most Recent Value  TOC Interventions   TOC Interventions Discussed/Reviewed TOC  Interventions Discussed, TOC Interventions Reviewed, Arranged PCP follow up within 7 days/Care Guide scheduled        Gean Maidens BSN RN [facility] [phone]

## 2023-06-05 NOTE — Telephone Encounter (Signed)
I spoke with the pt and promise her that I will find a place for her to see Dr [name] when she is back in the office.. she says she has been fleeing much better and has not had any further chest pain since she was in the ED on 06/02/23.   I will forward to Dr [name] for her information and if any further testing needed prior to seeing her.

## 2023-06-07 ENCOUNTER — Ambulatory Visit: Payer: 59 | Admitting: Family Medicine

## 2023-06-07 NOTE — Telephone Encounter (Signed)
Reviewed  ER felt that it was musculoskeltal pain Wil review time to come in with you

## 2023-06-10 ENCOUNTER — Encounter: Payer: Self-pay | Admitting: Physical Therapy

## 2023-06-10 ENCOUNTER — Ambulatory Visit: Payer: 59 | Admitting: Physical Therapy

## 2023-06-10 DIAGNOSIS — M5441 Lumbago with sciatica, right side: Secondary | ICD-10-CM | POA: Diagnosis not present

## 2023-06-10 DIAGNOSIS — R42 Dizziness and giddiness: Secondary | ICD-10-CM | POA: Diagnosis not present

## 2023-06-10 DIAGNOSIS — H8113 Benign paroxysmal vertigo, bilateral: Secondary | ICD-10-CM | POA: Diagnosis not present

## 2023-06-10 DIAGNOSIS — G8929 Other chronic pain: Secondary | ICD-10-CM | POA: Diagnosis not present

## 2023-06-10 DIAGNOSIS — M542 Cervicalgia: Secondary | ICD-10-CM | POA: Diagnosis not present

## 2023-06-10 NOTE — Progress Notes (Unsigned)
[name] [address] [place] [address] Phone: [phone] Subjective:   [name], am serving as a scribe for Dr. Antoine Primas.  I'm seeing this patient by the request  of:  Bradd Canary, MD  CC: f/u ankle pain  [id number]  04/03/2023 Patient given injection as well.  We discussed with patient and some of the lipoma of the sinus tarsi secondary to the change in her cosmetic.  Hopefully will decrease size somewhat difficult with the underlying pain as well.  still concerned that some of this could be secondary to the back pain and neuropathy as well.  discussed proper shoes, icing regimen, home exercises.  follow-up again in 6 to 8 weeks.  history of peripheral artery disease and will need to be continuing to monitor as well.     Mood seems to have more of a sinus tarsi syndrome with mild lipoma noted over the area. Discussed icing regimen, discussed compression, worsening pain can consider the possibility of surgical intervention but if not getting pain with moderate continuing. Follow-up again with me in 2 to 3 months   Update 06/11/2023 Tyreanna T Cedar is a 77 y.o. female coming in with complaint of B ankle pain.  Patient was given bilateral sinus tarsi injections at last follow-up.  Patient was to do home exercises, icing regimen and we discussed even the potential for compression.  Patient states that the R ankle is better but she has had little improvement in L foot pain. Pain over medial portion of midfoot and into the lateral midfoot. No new injury.        Past Medical History:  Diagnosis Date   Acute MI, subendocardial (HCC)    Arthritis    Back pain 05/31/2013   Benign paroxysmal positional vertigo 08/05/2016   CAD (coronary artery disease)    a. NSTEMI 10/2011: LHC 11/19/11: pLAD 30%, oOM 60%, AVCFX 30%, CFX after OM2 30%, pRCA 60 and 70%, then 99%, AM 80-90% with TIMI 3 flow.  PCI: Promus DES x 2 to RCA; b. 06/2012 Cath:  patent RCA stents w/ subtl occl of Acute Marginal (jailed)->Med rx; c. 05/2015 MV: EF 59%, mod mid infsept/inf/ap lat/ap infarct with peri-infarct isch-->Med Rx; d. 02/2016 Cath: LM nl, LAD 89m, RI 50, RCA patent stents.   Colitis    Facial skin lesion 01/28/2017   GERD (gastroesophageal reflux disease)    occasional   Hyperlipidemia    Hypertension    Hypertensive heart disease    a. Echocardiogram 11/19/11: Difficult acoustic windows, EF 60-65%, normal LV wall thickness, grade 1 diastolic dysfunction   Hypoparathyroidism (HCC)    Low zinc level 11/26/2016   Multinodular thyroid    Goiter s/p thyroidectomy in 2007 with post-op  hypocalcemia and post-op hypothyroidism/hypoparathyroidism with hypocalcemia   Neck pain on right side 07/13/2013   Nocturia 05/31/2013   Osteoarthritis    Pain in right axilla 03/13/2016   Palpitations    a. 03/2012 - patient set up for event monitor but did not wear correctly -  she declined wearing a repeat monitor   Post-surgical hypothyroidism    Sun-damaged skin 12/05/2014   Tubular adenoma of colon 06/2011   Unspecified constipation 05/31/2013   Vertigo    Zinc deficiency 11/26/2015   Past Surgical History:  Procedure Laterality Date   CARDIAC CATHETERIZATION N/A 03/08/2016   Procedure: Left Heart Cath and Coronary Angiography;  Surgeon: Corky Crafts, MD;  Location: [facility] INVASIVE CV LAB;  Service: Cardiovascular;  Laterality: N/A;   COLONOSCOPY  Aenomatous polyps   07/05/2011   COLONOSCOPY N/A 09/28/2014   Procedure: COLONOSCOPY;  Surgeon: Meryl Dare, MD;  Location: [facility] ENDOSCOPY;  Service: Endoscopy;  Laterality: N/A;   CORONARY ANGIOPLASTY WITH STENT PLACEMENT     LEFT HEART CATH AND CORONARY ANGIOGRAPHY N/A 12/09/2018   Procedure: LEFT HEART CATH AND CORONARY ANGIOGRAPHY;  Surgeon: Corky Crafts, MD;  Location: [facility] INVASIVE CV LAB;  Service: Cardiovascular;  Laterality: N/A;   LEFT HEART CATHETERIZATION WITH CORONARY ANGIOGRAM N/A 07/17/2012    Procedure: LEFT HEART CATHETERIZATION WITH CORONARY ANGIOGRAM;  Surgeon: Herby Abraham, MD;  Location: [facility] CATH LAB;  Service: Cardiovascular;  Laterality: N/A;   PERCUTANEOUS CORONARY STENT INTERVENTION (PCI-S) N/A 11/19/2011   Procedure: PERCUTANEOUS CORONARY STENT INTERVENTION (PCI-S);  Surgeon: Herby Abraham, MD;  Location: [facility] CATH LAB;  Service: Cardiovascular;  Laterality: N/A;   POLYPECTOMY     SHOULDER ARTHROSCOPY W/ ROTATOR CUFF REPAIR     right   TOTAL THYROIDECTOMY  2007   GOITER   Social History   Socioeconomic History   Marital status: Married    Spouse name: Not on file   Number of children: 4   Years of education: 14   Highest education level: Not on file  Occupational History   Occupation: Sales person at [employer]  Tobacco Use   Smoking status: Never   Smokeless tobacco: Never  Vaping Use   Vaping Use: Never used  Substance and Sexual Activity   Alcohol use: Not Currently   Drug use: No   Sexual activity: Not Currently  Other Topics Concern   Not on file  Social History Narrative   Lives at home alone.  Her son lives near her.   Right-handed.   1 cup coffee per day.   Social Determinants of Health   Financial Resource Strain: Low Risk  (05/07/2022)   Overall Financial Resource Strain (CARDIA)    Difficulty of Paying Living Expenses: Not hard at all  Food Insecurity: No Food Insecurity (06/05/2023)   Hunger Vital Sign    Worried About Running Out of Food in the Last Year: Never true    Ran Out of Food in the Last Year: Never true  Transportation Needs: No Transportation Needs (06/05/2023)   PRAPARE - Administrator, Civil Service (Medical): No    Lack of Transportation (Non-Medical): No  Physical Activity: Sufficiently Active (05/07/2022)   Exercise Vital Sign    Days of Exercise per Week: 7 days    Minutes of Exercise per Session: 30 min  Stress: No Stress Concern Present (05/07/2022)   [employer] of Occupational Health - Occupational  Stress Questionnaire    Feeling of Stress : Not at all  Social Connections: Not on file   Allergies  Allergen Reactions   Zetia [Ezetimibe] Other (See Comments)    Myalgia, paresthesias and weakness   Penicillin G Rash   Family History  Problem Relation Age of Onset   Colon cancer Brother    Cancer Brother        COLON   Hypertension Father    Heart disease Father    Heart attack Father    Blindness Sister    Congestive Heart Failure Sister    Hypertension Sister    Healthy Daughter    Healthy Son    Thyroid disease Sister    Cancer Brother        multiple myelomas   Healthy  Daughter    Healthy Daughter    Diabetes Neg Hx    Prostate cancer Neg Hx    Breast cancer Neg Hx    Esophageal cancer Neg Hx    Rectal cancer Neg Hx    Stomach cancer Neg Hx     Current Outpatient Medications (Endocrine & Metabolic):    calcitRIOL (ROCALTROL) 0.25 MCG capsule, TAKE 3 CAPSULES BY MOUTH DAILY   levothyroxine (SYNTHROID) 125 MCG tablet, TAKE 1 TABLET BY MOUTH DAILY  BEFORE BREAKFAST  Current Outpatient Medications (Cardiovascular):    amLODipine (NORVASC) 10 MG tablet, Take 1 tablet (10 mg total) by mouth daily.   bisoprolol (ZEBETA) 5 MG tablet, Take 1 tablet (5 mg total) by mouth daily.   furosemide (LASIX) 20 MG tablet, 1 tab po daily prn severe pedal edema   hydrALAZINE (APRESOLINE) 10 MG tablet, TAKE 1/2 TABLET (5 MG) BY MOUTH AS NEEDED FOR BP ABOVE 160 OR GREATER   isosorbide mononitrate (IMDUR) 60 MG 24 hr tablet, Take 1 tablet (60 mg total) by mouth daily.   nitroGLYCERIN (NITROSTAT) 0.4 MG SL tablet, DISSOLVE 1 TABLET UNDER THE  TONGUE EVERY 5 MINUTES AS NEEDED FOR CHEST PAIN. MAX OF 3 TABLETS IN 15 MINUTES. CALL 911 IF PAIN  PERSISTS.   REPATHA SURECLICK 140 MG/ML SOAJ, INJECT 140MG  SUBCUTANEOUSLY  EVERY 2 WEEKS   rosuvastatin (CRESTOR) 40 MG tablet, Take 1 tablet (40 mg total) by mouth daily.   Current Outpatient Medications (Analgesics):    aspirin EC 81 MG tablet,  Take 81 mg by mouth daily.  Current Outpatient Medications (Hematological):    clopidogrel (PLAVIX) 75 MG tablet, Take 1 tablet (75 mg total) by mouth daily.  Current Outpatient Medications (Other):    B Complex-C (B-COMPLEX WITH VITAMIN C) tablet, Take 1 tablet by mouth daily.   Cholecalciferol (VITAMIN D) 125 MCG (5000 UT) CAPS, Take 5,000 Units by mouth daily in the afternoon.   ciclopirox (PENLAC) 8 % solution, Apply topically at bedtime. Apply over nail and surrounding skin. Apply daily over previous coat. After seven (7) days, may remove with alcohol and continue cycle.   diazepam (VALIUM) 5 MG tablet, Take 1 tablet (5 mg total) by mouth daily as needed for anxiety (MRI). Take 1 one hour prior to MRI and another when arrive   gabapentin (NEURONTIN) 100 MG capsule, Take 2 capsules (200 mg total) by mouth at bedtime.   Multiple Vitamin (MULTIVITAMIN WITH MINERALS) TABS tablet, Take 1 tablet by mouth daily.   pantoprazole (PROTONIX) 40 MG tablet, Take 1 tablet (40 mg total) by mouth daily.   urea (CARMOL) 10 % cream, Apply topically as needed.   Reviewed prior external information including notes and imaging from  primary care provider As well as notes that were available from care everywhere and other healthcare systems.  Past medical history, social, surgical and family history all reviewed in electronic medical record.  No pertanent information unless stated regarding to the chief complaint.   Review of Systems:  No headache, visual changes, nausea, vomiting, diarrhea, constipation, dizziness, abdominal pain, skin rash, fevers, chills, night sweats, weight loss, swollen lymph nodes, body aches, joint swelling, chest pain, shortness of breath, mood changes. POSITIVE muscle aches  Objective  Blood pressure 112/80, pulse (!) 58, height 5\' 5"  (1.651 m), weight 190 lb (86.2 kg), SpO2 96 %.   General: No apparent distress alert and oriented x3 mood and affect normal, dressed  appropriately.  HEENT: Pupils equal, extraocular movements intact  Respiratory: Patient's  speak in full sentences and does not appear short of breath  Cardiovascular: No lower extremity edema, non tender, no erythema  Ankle exam shows still has some cyst formation noted over the peroneal tendons.  Patient was having more pain over the midfoot of the right foot noted.  Ankle exam seems to be less tender than previous exam.  Procedure: Real-time Ultrasound Guided Injection of left midfoot Device: GE Logiq Q7 Ultrasound guided injection is preferred based studies that show increased duration, increased effect, greater accuracy, decreased procedural pain, increased response rate, and decreased cost with ultrasound guided versus blind injection.  Verbal informed consent obtained.  Time-out conducted.  Noted no overlying erythema, induration, or other signs of local infection.  Skin prepped in a sterile fashion.  Local anesthesia: Topical Ethyl chloride.  With sterile technique and under real time ultrasound guidance: With a 25-gauge half inch needle injected with 0.5 cc of 0.5% Marcaine and 0.5 cc of Kenalog 40 mg/mL Completed without difficulty  advised to call if fevers/chills, erythema, induration, drainage, or persistent bleeding.  Impression: Technically successful ultrasound guided injection.    Impression and Recommendations:    The above documentation has been reviewed and is accurate and complete Judi Saa, DO

## 2023-06-10 NOTE — Therapy (Signed)
OUTPATIENT PHYSICAL THERAPY TREATMENT   Patient Name: [name] MRN: [id number] DOB:[birth year], 77 y.o., female Today's Date: 06/10/2023  END OF SESSION:  PT End of Session - 06/10/23 1007     Visit Number 31    Date for PT Re-Evaluation 06/13/23    Authorization Type [employer] Medicare    Progress Note Due on Visit 35    PT Start Time 0934    PT Stop Time 1020    PT Time Calculation (min) 46 min    Activity Tolerance Patient tolerated treatment well    Behavior During Therapy [facility] for tasks assessed/performed               Past Medical History:  Diagnosis Date   Acute MI, subendocardial (HCC)    Arthritis    Back pain 05/31/2013   Benign paroxysmal positional vertigo 08/05/2016   CAD (coronary artery disease)    a. NSTEMI 10/2011: LHC 11/19/11: pLAD 30%, oOM 60%, AVCFX 30%, CFX after OM2 30%, pRCA 60 and 70%, then 99%, AM 80-90% with TIMI 3 flow.  PCI: Promus DES x 2 to RCA; b. 06/2012 Cath: patent RCA stents w/ subtl occl of Acute Marginal (jailed)->Med rx; c. 05/2015 MV: EF 59%, mod mid infsept/inf/ap lat/ap infarct with peri-infarct isch-->Med Rx; d. 02/2016 Cath: LM nl, LAD 63m, RI 50, RCA patent stents.   Colitis    Facial skin lesion 01/28/2017   GERD (gastroesophageal reflux disease)    occasional   Hyperlipidemia    Hypertension    Hypertensive heart disease    a. Echocardiogram 11/19/11: Difficult acoustic windows, EF 60-65%, normal LV wall thickness, grade 1 diastolic dysfunction   Hypoparathyroidism (HCC)    Low zinc level 11/26/2016   Multinodular thyroid    Goiter s/p thyroidectomy in 2007 with post-op  hypocalcemia and post-op hypothyroidism/hypoparathyroidism with hypocalcemia   Neck pain on right side 07/13/2013   Nocturia 05/31/2013   Osteoarthritis    Pain in right axilla 03/13/2016   Palpitations    a. 03/2012 - patient set up for event monitor but did not wear correctly -  she declined wearing a repeat monitor   Post-surgical hypothyroidism    Sun-damaged  skin 12/05/2014   Tubular adenoma of colon 06/2011   Unspecified constipation 05/31/2013   Vertigo    Zinc deficiency 11/26/2015   Past Surgical History:  Procedure Laterality Date   CARDIAC CATHETERIZATION N/A 03/08/2016   Procedure: Left Heart Cath and Coronary Angiography;  Surgeon: Corky Crafts, MD;  Location: [facility] INVASIVE CV LAB;  Service: Cardiovascular;  Laterality: N/A;   COLONOSCOPY  Aenomatous polyps   07/05/2011   COLONOSCOPY N/A 09/28/2014   Procedure: COLONOSCOPY;  Surgeon: Meryl Dare, MD;  Location: [facility] ENDOSCOPY;  Service: Endoscopy;  Laterality: N/A;   CORONARY ANGIOPLASTY WITH STENT PLACEMENT     LEFT HEART CATH AND CORONARY ANGIOGRAPHY N/A 12/09/2018   Procedure: LEFT HEART CATH AND CORONARY ANGIOGRAPHY;  Surgeon: Corky Crafts, MD;  Location: [facility] INVASIVE CV LAB;  Service: Cardiovascular;  Laterality: N/A;   LEFT HEART CATHETERIZATION WITH CORONARY ANGIOGRAM N/A 07/17/2012   Procedure: LEFT HEART CATHETERIZATION WITH CORONARY ANGIOGRAM;  Surgeon: Herby Abraham, MD;  Location: [facility] CATH LAB;  Service: Cardiovascular;  Laterality: N/A;   PERCUTANEOUS CORONARY STENT INTERVENTION (PCI-S) N/A 11/19/2011   Procedure: PERCUTANEOUS CORONARY STENT INTERVENTION (PCI-S);  Surgeon: Herby Abraham, MD;  Location: [facility] CATH LAB;  Service: Cardiovascular;  Laterality: N/A;   POLYPECTOMY     SHOULDER ARTHROSCOPY  W/ ROTATOR CUFF REPAIR     right   TOTAL THYROIDECTOMY  2007   GOITER   Patient Active Problem List   Diagnosis Date Noted   Sinus tarsi syndrome of left ankle 04/03/2023   Sinus tarsi syndrome of right ankle 02/18/2023   Edema 02/17/2023   Acute prerenal azotemia 02/06/2023   TIA (transient ischemic attack) 02/05/2023   Paresthesia 08/28/2022   Chronic neck pain 08/28/2022   Neuropathy 08/21/2022   Vitamin deficiency 10/23/2021   Hypoglycemia 10/07/2019   Heart disease 01/29/2019   Sensorineural hearing loss (SNHL) of both ears 01/27/2019   Primary  osteoarthritis of both knees 12/19/2018   DDD (degenerative disc disease), lumbar 12/19/2018   ANA positive 10/27/2018   Anxiety 02/17/2018   Peripheral arterial disease (HCC) 02/20/2017   Skin lesion of face 01/28/2017   Benign paroxysmal positional vertigo 08/05/2016   Antiplatelet or antithrombotic long-term use 03/27/2016   Right lumbar radiculopathy 02/21/2016   Abnormality of gait 02/21/2016   Zinc deficiency 11/26/2015   Right hip pain 10/26/2015   Medicare annual wellness visit, subsequent 03/13/2015   Preventative health care 03/13/2015   Sun-damaged skin 12/05/2014   Angina of effort 12/02/2014   Hx of colonic polyps 09/28/2014   Benign neoplasm of descending colon 09/28/2014   Other fatigue 09/10/2014   Personal history of colonic polyps 07/16/2014   Bilateral thumb pain 03/20/2014   Postsurgical hypoparathyroidism (HCC) 08/03/2013   Neck pain on right side 07/13/2013   Constipation 05/31/2013   Back pain 05/31/2013   Nocturia 05/31/2013   GERD (gastroesophageal reflux disease) 11/02/2012   Tension headache 11/02/2012   Anemia 06/11/2012   Hypokalemia 06/11/2012   Depression 01/21/2012   Headache(784.0) 12/23/2011   Sinus bradycardia 12/07/2011   Hypocalcemia 12/07/2011   Acute MI, subendocardial (HCC) 11/20/2011   Acquired hypothyroidism 05/20/2009   Essential hypertension, benign 05/20/2009   Hyperlipidemia 03/08/2009   CAD, NATIVE VESSEL 03/08/2009    PCP: Danise Edge, MD REFERRING PROVIDER: Danise Edge, MD  REFERRING DIAG: BPPV  THERAPY DIAG:  Chronic right-sided low back pain with right-sided sciatica  Dizziness and giddiness  ONSET DATE: 2014  Rationale for Evaluation and Treatment: Rehabilitation  SUBJECTIVE:   SUBJECTIVE STATEMENT: [name] reports she woke up in a lot of pain today.  Wants "needles and hot pack only."    PERTINENT HISTORY: h/o chronic vertigo, referred to PT by PCP to assess for BPPV  PAIN:  Are you having  pain? Yes: NPRS scale: 6/10 Pain location: right side lower back  Pain description: tender to touch Aggravating factors: twisting, bending Relieving factors: nothing  PRECAUTIONS: None  WEIGHT BEARING RESTRICTIONS: No  FALLS: Has patient fallen in last 6 months? No  LIVING ENVIRONMENT: Lives with: lives alone Lives in: House/apartment Stairs: No Has following equipment at home: None  PLOF: Independent  PATIENT GOALS: get rid of dizziness, nausea  OBJECTIVE:   DIAGNOSTIC FINDINGS: MRI at hospital recently brain and CT due to TIA/ stroke Sx , and both with negative results  COGNITION: Overall cognitive status: Within functional limits for tasks assessed   SENSATION: WFL  EDEMA:  None noted   POSTURE:  rounded shoulders, forward head, and increased thoracic kyphosis  Cervical ROM:    Active A/PROM (deg) eval  Flexion 70%  Extension 50%  Right lateral flexion   Left lateral flexion   Right rotation 50%  Left rotation 40%  (Blank rows = not tested)  STRENGTH: L thumb extension 3+/5, all other MMT  elbow and distally wnl, shoulders not assessed, currently under care for R shoulder pain/ injury  LOWER EXTREMITY MMT: grossly wnl, 04/18/23:  reassessed and patient with pain with locked bridge l LE , pain R SI jt line  BED MOBILITY:  Sit to supine Modified independence Supine to sit Modified independence Rolling to Right Modified independence Rolling to Left Modified independence  TRANSFERS:all wnl RAMP:  na  CURB:  na  GAIT: Gait pattern: WFL Distance walked: in clinic over 50' Assistive device utilized: None Level of assistance: Complete Independence  SPECIAL TESTS: SI provocative testing:  thigh thrust + R, faber's + R, march test + R  Unilateral stance wnl B   PATIENT SURVEYS:  DHI 47/80 or 42% deficit  VESTIBULAR ASSESSMENT:  GENERAL OBSERVATION: guarded movements, frequently closes eyes , slow movements, avoids neck rotation with movement  transitions and resting posture   SYMPTOM BEHAVIOR:  Subjective history: 10 yr h.o vertigo, no previous Rx  Non-Vestibular symptoms: neck pain, nausea/vomiting, and numbness l side of mouth  Type of dizziness: Spinning/Vertigo and Lightheadedness/Faint  Frequency: daily  Duration: a few secs to several mins  Aggravating factors: Induced by position change: rolling to the right, rolling to the left, and supine to sit, Induced by motion: bending down to the ground, turning body quickly, and turning head quickly, Worse in the dark, and Moving eyes  Relieving factors: head stationary and closing eyes  Progression of symptoms: worse  OCULOMOTOR EXAM:  Ocular Alignment: normal  Ocular ROM: No Limitations  Spontaneous Nystagmus: right beating  Gaze-Induced Nystagmus: right beating with right gaze  Smooth Pursuits: saccades  Saccades: hypermetric/overshoots and extra eye movements  Convergence/Divergence:na today    Gaze-Induced Nystagmus: absent   Positional tests: Right Dix-Hallpike: no nystagmus Left Dix-Hallpike: no nystagmus Right Roll Test: symptoms worse on left side Left Roll Test: symptoms worse on left side  VESTIBULAR - OCULAR REFLEX: nt today  POSITIONAL TESTING: Right Dix-Hallpike: no nystagmus Left Dix-Hallpike: no nystagmus Right Roll Test: no nystagmus Left Roll Test: no nystagmus  MOTION SENSITIVITY:  Motion Sensitivity Quotient Intensity: 0 = none, 1 = Lightheaded, 2 = Mild, 3 = Moderate, 4 = Severe, 5 = Vomiting  Intensity  1. Sitting to supine   2. Supine to L side   3. Supine to R side   4. Supine to sitting   5. L Hallpike-Dix   6. Up from L    7. R Hallpike-Dix   8. Up from R    9. Sitting, head tipped to L knee   10. Head up from L knee   11. Sitting, head tipped to R knee   12. Head up from R knee   13. Sitting head turns x5   14.Sitting head nods x5   15. In stance, 180 turn to L    16. In stance, 180 turn to R     OTHOSTATICS: not  done  FUNCTIONAL GAIT:  na today   TREATMENT:  DATE:  06/10/23 Manual Therapy: to decrease muscle spasm and pain and improve mobility STM/TPR to bil lumbar paraspinals, IASTM with foam roller to bil glutes, UPA mobs lumbar spine, skilled palpation and monitoring during dry needling. Trigger Point Dry-Needling  Treatment instructions: Expect mild to moderate muscle soreness. S/S of pneumothorax if dry needled over a lung field, and to seek immediate medical attention should they occur. Patient verbalized understanding of these instructions and education. Patient Consent Given: Yes Education handout provided: No Muscles treated: R piriformis, R glut med Treatment response/outcome: Twitch Response Elicited and Palpable Increase in Muscle Length Modalities: Estim (IFC) to lumbar spine with MHP x 15 min in prone.    06/04/2023 Therapeutic Exercise: to improve strength and mobility.  Demo, verbal and tactile cues throughout for technique.  Vitals 110/62 LTR - focusing on side of preference SKTC - focusing on side of preference MET with resisted R hip extension 5 x 5 sec holds Isometric hip adduction/abduction 5 x 5 sec holds Manual Therapy: to decrease muscle spasm and pain and improve mobility IASTM to R glutes, QL, lumbar paraspinals, R UPA mobs lumbar spine grade 2-3, pin & Stretch to R piriformis Modalities: Estim - Premod to R piriformis and R lumbar erector spinae x 15 min with MHP to decrease muscle spasm and pain.     05/23/23: Manual: Sidelying L for muscle energy to improve rotation and mobility of lumbar spine, 5 sec bouts, 5 reps Cross friction massage R quadratus lumborum Trigger Point Dry-Needling  Treatment instructions: Expect mild to moderate muscle soreness. S/S of pneumothorax if dry needled over a lung field, and to seek immediate medical attention should they occur. Patient  verbalized understanding of these instructions and education. Patient Consent Given: Yes Education handout provided: No Muscles treated: R glut max Electrical stimulation performed: No Parameters: N/A Treatment response/outcome: Twitch Response Elicited and Palpable Increase in Muscle Length  Prone for estim with moist heat 15 min post Rx lumbar region/SI for pain relief     05/07/23: Vestibular treatment: Assessed for and  + brief, 5 sec interval of nystagmus and vertigo in R Dix Halpike position, + for posterior canalithiasis.  Therefore utilized Epley maneuver, one cycle.  Then reassessed and no Sx.  Sx improved with supine to sit as well.  Offered to assess L post canal but pt deferred.  Did utilize dry needling r post hip per pts request:  Trigger Point Dry-Needling  Treatment instructions: Expect mild to moderate muscle soreness. S/S of pneumothorax if dry needled over a lung field, and to seek immediate medical attention should they occur. Patient verbalized understanding of these instructions and education. Patient Consent Given: Yes Education handout provided: Previously provided Muscles treated: r gluteals, minimus, medius, maximal, R TFL   Electrical stimulation performed: No Parameters: N/A Treatment response/outcome: Twitch Response Elicited and Palpable Increase in Muscle Length   Manual: gentle overpressure R post hip/gluteals following dry needling.    PATIENT EDUCATION: Education details: NA Person educated: Patient Education method: Explanation Education comprehension: verbalized understanding  HOME EXERCISE PROGRAM:  GOALS: Goals reviewed with patient? Yes  SHORT TERM GOALS: Target date: 03/12/23  Patient will report compliance with initial HEP.  Baseline:not initiated yet Goal status: IN PROGRESS  2.  Patient will report resolution of dizziness rolling over in bed.  Baseline: consistent dizziness Goal status: IN PROGRESS 03/13/23- improving  3.   Patient will complete further balance testing.  Baseline:  Goal status: IN PROGRESS LONG TERM GOALS: Target date: 06/13/23  1.Patient will be independent with progressed HEP to improve outcomes and carryover.  Baseline: not initiated yet Goal status: IN PROGRESS        2. Patient will demonstrate 26/30 score on FGA to decrease risk of falls.  Baseline: na yet Goal status: IN PROGRESS   Patient will report 56 or greater on DHI to demonstrate improved QOL. Baseline: 47 Goal status: IN PROGRESS Patient will be independent with initial HEP.  Baseline: given Goal status: MET - 11/21/22   LONG TERM GOALS for LS spine pain: Target date: 06/13/2023   Patient will be independent with advanced/ongoing HEP to improve outcomes and carryover.  Baseline:  Goal status: IN PROGRESS 01/31/23- good compliance, advanced today 06/10/23- reports performing exercises 2x/day  2.  Patient will report 75% improvement in low back pain to improve quality of life. Baseline:   Goal status: In Progress- 11/27/22  60-70% ; 01/31/23- 75% 04/18/23:  lower back pain ebbs and flows, back today and last week due to pain 06/10/23- pain pain has returned.   3. Patient will report 12% improvement on modified Oswestry to demonstrate improved functional ability. Baseline: 12/19/22 -30% Goal status: MET   01/31/23- 18% impairment. 04/18/23: 14/50 or 28% disability  4. Patient will will be able to return to community-based exercise program to maintain progress. Baseline: stopped exercising due to pain Goal status: IN PROGRESS 04/18/23 not exercising consistently 06/10/23- walks 2x/week    ASSESSMENT:  CLINICAL IMPRESSION: Aidyn T Cayer reports increased LBP again today, after performing heavy activities yesterday, including a lot of pulling.  We did discussed body mechanics and I demonstrated how to perform golfer's lift keeping back straight, and pulling with staggered stance using hips rather than back.  Extremely tender in  R glutes, she requested dry needling but had very poor tolerance for overall, focused on gentle manual therapy followed by e-stim and MHP to her back which decreased her pain and spasm significantly.   Tamira T Rosier continues to demonstrate potential for improvement and would benefit from continued skilled therapy to address impairments.    OBJECTIVE IMPAIRMENTS: decreased activity tolerance, decreased ROM, dizziness, and postural dysfunction.   ACTIVITY LIMITATIONS: lifting, bending, sleeping, bed mobility, and reach over head  PARTICIPATION LIMITATIONS: cleaning, laundry, and community activity  PERSONAL FACTORS: Time since onset of injury/illness/exacerbation and 1-2 comorbidities: arthritis multiple sites,HTN  are also affecting patient's functional outcome.   REHAB POTENTIAL: Good  CLINICAL DECISION MAKING: Stable/uncomplicated  EVALUATION COMPLEXITY: Low   PLAN:  PT FREQUENCY: 1-2x/week  PT DURATION: 6 weeks  PLANNED INTERVENTIONS: Therapeutic exercises, Therapeutic activity, Neuromuscular re-education, Balance training, Gait training, Patient/Family education, Self Care, Joint mobilization, and Canalith repositioning  PLAN FOR NEXT SESSION: do progress note and reassess LBP, reassess BPPV, perform VOR tests , further assess cervical spine, set new LB goals.    Jena Gauss, PT, DPT  06/10/2023, 11:57 AM

## 2023-06-11 ENCOUNTER — Ambulatory Visit (INDEPENDENT_AMBULATORY_CARE_PROVIDER_SITE_OTHER): Payer: 59 | Admitting: Family Medicine

## 2023-06-11 ENCOUNTER — Ambulatory Visit: Payer: 59 | Admitting: Family Medicine

## 2023-06-11 ENCOUNTER — Encounter: Payer: Self-pay | Admitting: Family Medicine

## 2023-06-11 ENCOUNTER — Other Ambulatory Visit: Payer: Self-pay

## 2023-06-11 VITALS — BP 112/80 | HR 58 | Ht 65.0 in | Wt 190.0 lb

## 2023-06-11 DIAGNOSIS — M25572 Pain in left ankle and joints of left foot: Secondary | ICD-10-CM | POA: Diagnosis not present

## 2023-06-11 DIAGNOSIS — M19072 Primary osteoarthritis, left ankle and foot: Secondary | ICD-10-CM | POA: Insufficient documentation

## 2023-06-11 DIAGNOSIS — G8929 Other chronic pain: Secondary | ICD-10-CM

## 2023-06-11 DIAGNOSIS — M25571 Pain in right ankle and joints of right foot: Secondary | ICD-10-CM

## 2023-06-11 NOTE — Patient Instructions (Signed)
Good to see you! Injection in foot See you again in 2 months

## 2023-06-11 NOTE — Assessment & Plan Note (Signed)
Patient does have midfoot arthritis noted.  Given injection today and tolerated the procedure well.  Hopefully this will make some improvement noted.  Discussed with patient to continue to monitor.  Patient still not wearing great shoes and we discussed more rigid bottom shoes would be beneficial.  Patient will follow-up again in 2 months because she is thinking about going back to [place].

## 2023-06-11 NOTE — Progress Notes (Unsigned)
   Acute Office Visit  Subjective:     Patient ID: [name], female    DOB: July 15, 1946, 77 y.o.   MRN: [id number]  No chief complaint on file.   HPI Patient is in today for ED follow-up.   ***abridge    ROS      Objective:    There were no vitals taken for this visit. {Vitals History (Optional):23777}  Physical Exam  No results found for any visits on 06/12/23.      Assessment & Plan:   Problem List Items Addressed This Visit   None   No orders of the defined types were placed in this encounter.   No follow-ups on file.  Clayborne Dana, NP

## 2023-06-12 ENCOUNTER — Ambulatory Visit (INDEPENDENT_AMBULATORY_CARE_PROVIDER_SITE_OTHER): Payer: 59 | Admitting: Family Medicine

## 2023-06-12 ENCOUNTER — Encounter: Payer: Self-pay | Admitting: Family Medicine

## 2023-06-12 VITALS — BP 138/60 | HR 52 | Ht 65.0 in | Wt 190.0 lb

## 2023-06-12 DIAGNOSIS — B351 Tinea unguium: Secondary | ICD-10-CM

## 2023-06-12 DIAGNOSIS — Z09 Encounter for follow-up examination after completed treatment for conditions other than malignant neoplasm: Secondary | ICD-10-CM | POA: Diagnosis not present

## 2023-06-12 MED ORDER — CICLOPIROX 8 % EX SOLN
Freq: Every day | CUTANEOUS | 0 refills | Status: DC
Start: 2023-06-12 — End: 2024-10-12

## 2023-06-12 NOTE — Patient Instructions (Addendum)
Glad to hear you are feeling better. Labs were at baseline at ED so no need to recheck right away. Continue monitoring for any chest pain/pressure, shortness of breath or other symptoms and keep follow-up with your cardiologist.   Refilling your Pen[place] for the toenail fungus.

## 2023-06-13 ENCOUNTER — Other Ambulatory Visit: Payer: Self-pay | Admitting: Family Medicine

## 2023-06-13 ENCOUNTER — Telehealth: Payer: Self-pay | Admitting: Internal Medicine

## 2023-06-13 DIAGNOSIS — K2100 Gastro-esophageal reflux disease with esophagitis, without bleeding: Secondary | ICD-10-CM

## 2023-06-13 MED ORDER — AMLODIPINE BESYLATE 10 MG PO TABS: 10.0000 mg | ORAL_TABLET | Freq: Every day | ORAL | 3 refills | Status: DC

## 2023-06-13 NOTE — Telephone Encounter (Signed)
*  STAT* If patient is at the pharmacy, call can be transferred to refill team.   1. Which medications need to be refilled? (please list name of each medication and dose if known)           amLODipine (NORVASC) 10 MG tablet     2. Which pharmacy/location (including street and city if local pharmacy) is medication to be sent to?  [id number] - [place], [place] - [address]      3. Do they need a 30 day or 90 day supply? 90 day   Pt is completely out of medication

## 2023-06-13 NOTE — Telephone Encounter (Signed)
Pt's medication was sent to pt's pharmacy as requested. Confirmation received.  °

## 2023-06-17 ENCOUNTER — Ambulatory Visit: Payer: 59 | Admitting: Physical Therapy

## 2023-06-18 NOTE — Telephone Encounter (Signed)
Made pt an appt for this Friday.. not sure if she can make it.. I left her a message.

## 2023-06-21 ENCOUNTER — Encounter: Payer: Self-pay | Admitting: Internal Medicine

## 2023-06-21 ENCOUNTER — Ambulatory Visit: Payer: 59 | Attending: Internal Medicine | Admitting: Internal Medicine

## 2023-06-21 VITALS — BP 122/68 | HR 58 | Ht 65.0 in | Wt 188.4 lb

## 2023-06-21 DIAGNOSIS — R0989 Other specified symptoms and signs involving the circulatory and respiratory systems: Secondary | ICD-10-CM

## 2023-06-21 DIAGNOSIS — E039 Hypothyroidism, unspecified: Secondary | ICD-10-CM

## 2023-06-21 DIAGNOSIS — I10 Essential (primary) hypertension: Secondary | ICD-10-CM

## 2023-06-21 DIAGNOSIS — I2510 Atherosclerotic heart disease of native coronary artery without angina pectoris: Secondary | ICD-10-CM

## 2023-06-21 DIAGNOSIS — E876 Hypokalemia: Secondary | ICD-10-CM

## 2023-06-21 DIAGNOSIS — E785 Hyperlipidemia, unspecified: Secondary | ICD-10-CM | POA: Diagnosis not present

## 2023-06-21 MED ORDER — CLOPIDOGREL BISULFATE 75 MG PO TABS: 75.0000 mg | ORAL_TABLET | Freq: Every day | ORAL | 3 refills | Status: DC

## 2023-06-21 MED ORDER — FUROSEMIDE 20 MG PO TABS: ORAL_TABLET | ORAL | 3 refills | Status: DC

## 2023-06-21 MED ORDER — AMLODIPINE BESYLATE 10 MG PO TABS: 10.0000 mg | ORAL_TABLET | Freq: Every day | ORAL | 3 refills | Status: DC

## 2023-06-21 MED ORDER — ROSUVASTATIN CALCIUM 40 MG PO TABS: 40.0000 mg | ORAL_TABLET | Freq: Every day | ORAL | 3 refills | Status: DC

## 2023-06-21 MED ORDER — BISOPROLOL FUMARATE 5 MG PO TABS: 5.0000 mg | ORAL_TABLET | Freq: Every day | ORAL | 3 refills | Status: DC

## 2023-06-21 MED ORDER — ISOSORBIDE MONONITRATE ER 60 MG PO TB24: 60.0000 mg | ORAL_TABLET | Freq: Every day | ORAL | 3 refills | Status: DC

## 2023-06-21 NOTE — Patient Instructions (Signed)
Medication Instructions:  Your physician recommends that you continue on your current medications as directed. Please refer to the Current Medication list given to you today.  *If you need a refill on your cardiac medications before your next appointment, please call your pharmacy*  Lab Work: TODAY: vitamin D, NMR, CMET If you have labs (blood work) drawn today and your tests are completely normal, you will receive your results only by: MyChart Message (if you have MyChart) OR A paper copy in the mail If you have any lab test that is abnormal or we need to change your treatment, we will call you to review the results.   Testing/Procedures: None ordered today.  Follow-Up: At [facility], you and your health needs are our priority.  As part of our continuing mission to provide you with exceptional heart care, we have created designated Provider Care Teams.  These Care Teams include your primary Cardiologist (physician) and Advanced Practice Providers (APPs -  Physician Assistants and Nurse Practitioners) who all work together to provide you with the care you need, when you need it.  Your next appointment:   3-4 month(s)  The format for your next appointment:   In Person  Provider:   Dietrich Pates, MD {

## 2023-06-21 NOTE — Progress Notes (Signed)
Cardiology Office Note   Date:  06/21/2023   ID:  [name], DOB 1946-06-23, MRN [id number]  PCP:  Bradd Canary, MD  Cardiologist:   Dietrich Pates, MD   F/U of CAD    History of Present Illness:  [name] is a 77 y.o. female with CAD - s/p NSTEMI 10/2011 with DESx2  Also has a hx of  GERD, HTN, HLD, vertigo.  L heart cath in Dec 2019 showed:  LAD with 25%  OM  with 100% with Left to R  collaterals  RCA with patent stent   Normal LVEF    Echo in early Sept 2020   LVEF and RVEF were normal   Mild diastolic dysfunction      In 2023 the pt had some numbness on L face   Seen by  neuro  who ultimately felt it did not represent a TIA  Echo done   Normal   Carotid USN without severe dz   Symptoms felt to be due to DJD of neck     I saw the pt in [facility] in April 2024    Earlier this month she was seen in ER for CP/ shoulder discomfort The pt was at home Sitting    Developed L upper CP  With this developed L shoulder pain   BP severely elevated when EMS came and when got to ER   Symptoms eased off with NTG Labs negative    PT sent home SInce seen the pt had an episode of CP (shart) while watching TV   L shoulder pain   Eased off with NTG     Otherwise she has felt good   No CP with activity   No shoulder pain with activity  She is getting dry needling of R back   Current Meds  Medication Sig   aspirin EC 81 MG tablet Take 81 mg by mouth daily.   B Complex-C (B-COMPLEX WITH VITAMIN C) tablet Take 1 tablet by mouth daily.   calcitRIOL (ROCALTROL) 0.25 MCG capsule TAKE 3 CAPSULES BY MOUTH DAILY   Cholecalciferol (VITAMIN D) 125 MCG (5000 UT) CAPS Take 5,000 Units by mouth daily in the afternoon.   ciclopirox (PENLAC) 8 % solution Apply topically at bedtime. Apply over nail and surrounding skin. Apply daily over previous coat. After seven (7) days, may remove with alcohol and continue cycle.   diazepam (VALIUM) 5 MG tablet Take 1 tablet (5 mg total) by mouth daily as needed for  anxiety (MRI). Take 1 one hour prior to MRI and another when arrive   gabapentin (NEURONTIN) 100 MG capsule Take 2 capsules (200 mg total) by mouth at bedtime.   hydrALAZINE (APRESOLINE) 10 MG tablet TAKE 1/2 TABLET (5 MG) BY MOUTH AS NEEDED FOR BP ABOVE 160 OR GREATER   levothyroxine (SYNTHROID) 125 MCG tablet TAKE 1 TABLET BY MOUTH DAILY  BEFORE BREAKFAST   Multiple Vitamin (MULTIVITAMIN WITH MINERALS) TABS tablet Take 1 tablet by mouth daily.   nitroGLYCERIN (NITROSTAT) 0.4 MG SL tablet DISSOLVE 1 TABLET UNDER THE  TONGUE EVERY 5 MINUTES AS NEEDED FOR CHEST PAIN. MAX OF 3 TABLETS IN 15 MINUTES. CALL 911 IF PAIN  PERSISTS.   pantoprazole (PROTONIX) 40 MG tablet TAKE 1 TABLET BY MOUTH DAILY   REPATHA SURECLICK 140 MG/ML SOAJ INJECT 140MG  SUBCUTANEOUSLY  EVERY 2 WEEKS   urea (CARMOL) 10 % cream Apply topically as needed.   [DISCONTINUED] amLODipine (NORVASC) 10 MG tablet Take 1 tablet (  10 mg total) by mouth daily.   [DISCONTINUED] bisoprolol (ZEBETA) 5 MG tablet Take 1 tablet (5 mg total) by mouth daily.   [DISCONTINUED] clopidogrel (PLAVIX) 75 MG tablet Take 1 tablet (75 mg total) by mouth daily.   [DISCONTINUED] furosemide (LASIX) 20 MG tablet TAKE 1 TABLET BY MOUTH ONCE  WEEKLY AS NEEDED   [DISCONTINUED] isosorbide mononitrate (IMDUR) 60 MG 24 hr tablet Take 1 tablet (60 mg total) by mouth daily.   [DISCONTINUED] rosuvastatin (CRESTOR) 40 MG tablet Take 1 tablet (40 mg total) by mouth daily.     Allergies:   Zetia [ezetimibe] and Penicillin g   Past Medical History:  Diagnosis Date   Acute MI, subendocardial (HCC)    Arthritis    Back pain 05/31/2013   Benign paroxysmal positional vertigo 08/05/2016   CAD (coronary artery disease)    a. NSTEMI 10/2011: LHC 11/19/11: pLAD 30%, oOM 60%, AVCFX 30%, CFX after OM2 30%, pRCA 60 and 70%, then 99%, AM 80-90% with TIMI 3 flow.  PCI: Promus DES x 2 to RCA; b. 06/2012 Cath: patent RCA stents w/ subtl occl of Acute Marginal (jailed)->Med rx; c. 05/2015  MV: EF 59%, mod mid infsept/inf/ap lat/ap infarct with peri-infarct isch-->Med Rx; d. 02/2016 Cath: LM nl, LAD 72m, RI 50, RCA patent stents.   Colitis    Facial skin lesion 01/28/2017   GERD (gastroesophageal reflux disease)    occasional   Hyperlipidemia    Hypertension    Hypertensive heart disease    a. Echocardiogram 11/19/11: Difficult acoustic windows, EF 60-65%, normal LV wall thickness, grade 1 diastolic dysfunction   Hypoparathyroidism (HCC)    Low zinc level 11/26/2016   Multinodular thyroid    Goiter s/p thyroidectomy in 2007 with post-op  hypocalcemia and post-op hypothyroidism/hypoparathyroidism with hypocalcemia   Neck pain on right side 07/13/2013   Nocturia 05/31/2013   Osteoarthritis    Pain in right axilla 03/13/2016   Palpitations    a. 03/2012 - patient set up for event monitor but did not wear correctly -  she declined wearing a repeat monitor   Post-surgical hypothyroidism    Sun-damaged skin 12/05/2014   Tubular adenoma of colon 06/2011   Unspecified constipation 05/31/2013   Vertigo    Zinc deficiency 11/26/2015    Past Surgical History:  Procedure Laterality Date   CARDIAC CATHETERIZATION N/A 03/08/2016   Procedure: Left Heart Cath and Coronary Angiography;  Surgeon: Corky Crafts, MD;  Location: [facility] INVASIVE CV LAB;  Service: Cardiovascular;  Laterality: N/A;   COLONOSCOPY  Aenomatous polyps   07/05/2011   COLONOSCOPY N/A 09/28/2014   Procedure: COLONOSCOPY;  Surgeon: Meryl Dare, MD;  Location: [facility] ENDOSCOPY;  Service: Endoscopy;  Laterality: N/A;   CORONARY ANGIOPLASTY WITH STENT PLACEMENT     LEFT HEART CATH AND CORONARY ANGIOGRAPHY N/A 12/09/2018   Procedure: LEFT HEART CATH AND CORONARY ANGIOGRAPHY;  Surgeon: Corky Crafts, MD;  Location: [facility] INVASIVE CV LAB;  Service: Cardiovascular;  Laterality: N/A;   LEFT HEART CATHETERIZATION WITH CORONARY ANGIOGRAM N/A 07/17/2012   Procedure: LEFT HEART CATHETERIZATION WITH CORONARY ANGIOGRAM;  Surgeon:  Herby Abraham, MD;  Location: [facility] CATH LAB;  Service: Cardiovascular;  Laterality: N/A;   PERCUTANEOUS CORONARY STENT INTERVENTION (PCI-S) N/A 11/19/2011   Procedure: PERCUTANEOUS CORONARY STENT INTERVENTION (PCI-S);  Surgeon: Herby Abraham, MD;  Location: [facility] CATH LAB;  Service: Cardiovascular;  Laterality: N/A;   POLYPECTOMY     SHOULDER ARTHROSCOPY W/ ROTATOR CUFF REPAIR  right   TOTAL THYROIDECTOMY  2007   GOITER     Social History:  The patient  reports that she has never smoked. She has never used smokeless tobacco. She reports that she does not currently use alcohol. She reports that she does not use drugs.   Family History:  The patient's family history includes Blindness in her sister; Cancer in her brother and brother; Colon cancer in her brother; Congestive Heart Failure in her sister; Healthy in her daughter, daughter, daughter, and son; Heart attack in her father; Heart disease in her father; Hypertension in her father and sister; Thyroid disease in her sister.    ROS:  Please see the history of present illness. All other systems are reviewed and  Negative to the above problem except as noted.    PHYSICAL EXAM: VS:  BP 122/68   Pulse (!) 58   Ht 5\' 5"  (1.651 m)   Wt 188 lb 6.4 oz (85.5 kg)   SpO2 96%   BMI 31.35 kg/m   GEN: Well nourished, well developed, in no acute distress  HEENT: normal  Neck: JVP is not elevated  L bruit Cardiac: RRR; no murmurs  No LE  edema  Respiratory:  clear to auscultation bilaterally GI: soft, nontender, nondistended,   EKG:  EKG is not ordered today    Echo   Sept 2023   1. Left ventricular ejection fraction, by estimation, is 60 to 65%. The  left ventricle has normal function. The left ventricle has no regional  wall motion abnormalities. There is mild left ventricular hypertrophy.  Left ventricular diastolic parameters  were normal.   2. Right ventricular systolic function is normal. The right ventricular  size is normal.    3. The mitral valve is abnormal. Trivial mitral valve regurgitation. No  evidence of mitral stenosis.   4. The aortic valve is tricuspid. Aortic valve regurgitation is trivial.  No aortic stenosis is present.   5. The inferior vena cava is normal in size with greater than 50%  respiratory variability, suggesting right atrial pressure of 3 mmHg.    Lipid Panel    Component Value Date/Time   CHOL 200 02/05/2023 2259   CHOL 172 10/11/2022 1050   TRIG 178 (H) 02/05/2023 2259   HDL 52 02/05/2023 2259   HDL 43 10/11/2022 1050   CHOLHDL 3.8 02/05/2023 2259   VLDL 36 02/05/2023 2259   LDLCALC 112 (H) 02/05/2023 2259   LDLCALC 101 (H) 10/11/2022 1050   LDLDIRECT 96.0 11/06/2022 1623      Wt Readings from Last 3 Encounters:  06/21/23 188 lb 6.4 oz (85.5 kg)  06/12/23 190 lb (86.2 kg)  06/11/23 190 lb (86.2 kg)      ASSESSMENT AND PLAN:  1  CAD  Pt with CAD  She has an occluded OM that fills via collaterals   If BP high this may expplain discomfort (if collateral flow is compromised) I would follow  She is not having symptoms at other times  If she has a recurrence I recomm she take BP     2   HTN   BP is good today  IT was very high in ER   Follow   May have to adjust meds   3  HL  Will get lipomed   Check BMET, VIt D, lipids   Follow up this fall    Current medicines are reviewed at length with the patient today.  The patient does not have concerns regarding  medicines.  Signed, Dietrich Pates, MD  06/21/2023 9:48 AM    [facility] [address] [place], [place], [place]  [address] Phone: [phone]; Fax: [phone]

## 2023-06-24 ENCOUNTER — Telehealth: Payer: Self-pay | Admitting: Internal Medicine

## 2023-06-24 NOTE — Telephone Encounter (Signed)
Patient called to follow-up on lab results.  Patient requested lab results also be mailed to her.

## 2023-06-24 NOTE — Telephone Encounter (Signed)
Still awaiting results

## 2023-06-25 NOTE — Telephone Encounter (Signed)
I spoke with the pt and she says she has been taking her Rosuvastatin 40 mg and Repatha regularly... she says she watched her fiet including low carbs diet.   I will check on her VIT D level and forward to Dr [name].   Labs mailed to her per her request.

## 2023-06-25 NOTE — Telephone Encounter (Signed)
-----   Message from Dietrich Pates V, MD sent at 06/25/2023  8:39 AM EDT ----- Vit D is still pending (it appears to have been ordered differently   please confirm) Electrolytes, kidney and liver function are OK LIpids   Confirm what meds she is taking   LDL 120   Was 67 1 year ago

## 2023-07-03 ENCOUNTER — Ambulatory Visit: Payer: 59 | Attending: Family Medicine | Admitting: Physical Therapy

## 2023-07-03 DIAGNOSIS — R293 Abnormal posture: Secondary | ICD-10-CM | POA: Insufficient documentation

## 2023-07-03 DIAGNOSIS — R42 Dizziness and giddiness: Secondary | ICD-10-CM | POA: Insufficient documentation

## 2023-07-03 DIAGNOSIS — H8113 Benign paroxysmal vertigo, bilateral: Secondary | ICD-10-CM | POA: Insufficient documentation

## 2023-07-03 DIAGNOSIS — M5441 Lumbago with sciatica, right side: Secondary | ICD-10-CM | POA: Insufficient documentation

## 2023-07-03 DIAGNOSIS — G8929 Other chronic pain: Secondary | ICD-10-CM | POA: Insufficient documentation

## 2023-07-03 DIAGNOSIS — M542 Cervicalgia: Secondary | ICD-10-CM | POA: Insufficient documentation

## 2023-07-03 DIAGNOSIS — R252 Cramp and spasm: Secondary | ICD-10-CM | POA: Insufficient documentation

## 2023-07-05 LAB — COMPREHENSIVE METABOLIC PANEL
ALT: 12 IU/L (ref 0–32)
AST: 14 IU/L (ref 0–40)
Albumin: 4.1 g/dL (ref 3.8–4.8)
Alkaline Phosphatase: 92 IU/L (ref 44–121)
BUN/Creatinine Ratio: 20 (ref 12–28)
BUN: 23 mg/dL (ref 8–27)
Bilirubin Total: 0.3 mg/dL (ref 0.0–1.2)
CO2: 22 mmol/L (ref 20–29)
Calcium: 9.8 mg/dL (ref 8.7–10.3)
Chloride: 105 mmol/L (ref 96–106)
Creatinine, Ser: 1.14 mg/dL — ABNORMAL HIGH (ref 0.57–1.00)
Globulin, Total: 2.6 g/dL (ref 1.5–4.5)
Glucose: 91 mg/dL (ref 70–99)
Potassium: 4.6 mmol/L (ref 3.5–5.2)
Sodium: 143 mmol/L (ref 134–144)
Total Protein: 6.7 g/dL (ref 6.0–8.5)
eGFR: 50 mL/min/{1.73_m2} — ABNORMAL LOW (ref >59)

## 2023-07-05 LAB — NMR, LIPOPROFILE
Cholesterol, Total: 206 mg/dL — ABNORMAL HIGH (ref 100–199)
HDL Particle Number: 33.6 umol/L (ref >=30.5)
HDL-C: 49 mg/dL (ref >39)
LDL Particle Number: 1519 nmol/L — ABNORMAL HIGH (ref <1000)
LDL Size: 21 nm (ref >20.5)
LDL-C (NIH Calc): 120 mg/dL — ABNORMAL HIGH (ref 0–99)
LP-IR Score: 69 — ABNORMAL HIGH (ref <=45)
Small LDL Particle Number: 578 nmol/L — ABNORMAL HIGH (ref <=527)
Triglycerides: 209 mg/dL — ABNORMAL HIGH (ref 0–149)

## 2023-07-05 LAB — VITAMIN D 1,25 DIHYDROXY
Vitamin D 1, 25 (OH)2 Total: 43 pg/mL
Vitamin D2 1, 25 (OH)2: <10 pg/mL
Vitamin D3 1, 25 (OH)2: 43 pg/mL

## 2023-07-08 ENCOUNTER — Encounter: Payer: 59 | Admitting: Physical Therapy

## 2023-07-09 ENCOUNTER — Ambulatory Visit: Payer: 59

## 2023-07-09 ENCOUNTER — Ambulatory Visit (HOSPITAL_COMMUNITY)
Admission: RE | Admit: 2023-07-09 | Discharge: 2023-07-09 | Disposition: A | Discharge status: Home / Self Care | Payer: 59 | Source: Ambulatory Visit | Attending: Internal Medicine | Admitting: Internal Medicine

## 2023-07-09 ENCOUNTER — Other Ambulatory Visit: Payer: Self-pay

## 2023-07-09 DIAGNOSIS — G8929 Other chronic pain: Secondary | ICD-10-CM | POA: Diagnosis not present

## 2023-07-09 DIAGNOSIS — H8113 Benign paroxysmal vertigo, bilateral: Secondary | ICD-10-CM | POA: Diagnosis not present

## 2023-07-09 DIAGNOSIS — R42 Dizziness and giddiness: Secondary | ICD-10-CM | POA: Diagnosis not present

## 2023-07-09 DIAGNOSIS — R252 Cramp and spasm: Secondary | ICD-10-CM | POA: Diagnosis not present

## 2023-07-09 DIAGNOSIS — R293 Abnormal posture: Secondary | ICD-10-CM

## 2023-07-09 DIAGNOSIS — M5441 Lumbago with sciatica, right side: Secondary | ICD-10-CM | POA: Diagnosis not present

## 2023-07-09 DIAGNOSIS — M542 Cervicalgia: Secondary | ICD-10-CM | POA: Diagnosis not present

## 2023-07-09 DIAGNOSIS — R0989 Other specified symptoms and signs involving the circulatory and respiratory systems: Secondary | ICD-10-CM | POA: Insufficient documentation

## 2023-07-09 NOTE — Telephone Encounter (Signed)
Would recomm follow up lipomed in December

## 2023-07-09 NOTE — Therapy (Signed)
OUTPATIENT PHYSICAL THERAPY TREATMENT   Patient Name: [name] MRN: [id number] DOB:12/27/1945, 77 y.o., female Today's Date: 07/09/2023  END OF SESSION:  PT End of Session - 07/09/23 1332     Visit Number 32    Date for PT Re-Evaluation 09/03/23    Authorization Type [employer]    Progress Note Due on Visit 42    PT Start Time 1108    PT Stop Time 1148    PT Time Calculation (min) 40 min    Activity Tolerance Patient tolerated treatment well    Behavior During Therapy WFL for tasks assessed/performed                Past Medical History:  Diagnosis Date   Acute MI, subendocardial (HCC)    Arthritis    Back pain 05/31/2013   Benign paroxysmal positional vertigo 08/05/2016   CAD (coronary artery disease)    a. NSTEMI 10/2011: LHC 11/19/11: pLAD 30%, oOM 60%, AVCFX 30%, CFX after OM2 30%, pRCA 60 and 70%, then 99%, AM 80-90% with TIMI 3 flow.  PCI: Promus DES x 2 to RCA; b. 06/2012 Cath: patent RCA stents w/ subtl occl of Acute Marginal (jailed)->Med rx; c. 05/2015 MV: EF 59%, mod mid infsept/inf/ap lat/ap infarct with peri-infarct isch-->Med Rx; d. 02/2016 Cath: LM nl, LAD 58m, RI 50, RCA patent stents.   Colitis    Facial skin lesion 01/28/2017   GERD (gastroesophageal reflux disease)    occasional   Hyperlipidemia    Hypertension    Hypertensive heart disease    a. Echocardiogram 11/19/11: Difficult acoustic windows, EF 60-65%, normal LV wall thickness, grade 1 diastolic dysfunction   Hypoparathyroidism (HCC)    Low zinc level 11/26/2016   Multinodular thyroid    Goiter s/p thyroidectomy in 2007 with post-op  hypocalcemia and post-op hypothyroidism/hypoparathyroidism with hypocalcemia   Neck pain on right side 07/13/2013   Nocturia 05/31/2013   Osteoarthritis    Pain in right axilla 03/13/2016   Palpitations    a. 03/2012 - patient set up for event monitor but did not wear correctly -  she declined wearing a repeat monitor   Post-surgical hypothyroidism     Sun-damaged skin 12/05/2014   Tubular adenoma of colon 06/2011   Unspecified constipation 05/31/2013   Vertigo    Zinc deficiency 11/26/2015   Past Surgical History:  Procedure Laterality Date   CARDIAC CATHETERIZATION N/A 03/08/2016   Procedure: Left Heart Cath and Coronary Angiography;  Surgeon: Corky Crafts, MD;  Location: [facility] INVASIVE CV LAB;  Service: Cardiovascular;  Laterality: N/A;   COLONOSCOPY  Aenomatous polyps   07/05/2011   COLONOSCOPY N/A 09/28/2014   Procedure: COLONOSCOPY;  Surgeon: Meryl Dare, MD;  Location: [facility] ENDOSCOPY;  Service: Endoscopy;  Laterality: N/A;   CORONARY ANGIOPLASTY WITH STENT PLACEMENT     LEFT HEART CATH AND CORONARY ANGIOGRAPHY N/A 12/09/2018   Procedure: LEFT HEART CATH AND CORONARY ANGIOGRAPHY;  Surgeon: Corky Crafts, MD;  Location: [facility] INVASIVE CV LAB;  Service: Cardiovascular;  Laterality: N/A;   LEFT HEART CATHETERIZATION WITH CORONARY ANGIOGRAM N/A 07/17/2012   Procedure: LEFT HEART CATHETERIZATION WITH CORONARY ANGIOGRAM;  Surgeon: Herby Abraham, MD;  Location: [facility] CATH LAB;  Service: Cardiovascular;  Laterality: N/A;   PERCUTANEOUS CORONARY STENT INTERVENTION (PCI-S) N/A 11/19/2011   Procedure: PERCUTANEOUS CORONARY STENT INTERVENTION (PCI-S);  Surgeon: Herby Abraham, MD;  Location: [facility] CATH LAB;  Service: Cardiovascular;  Laterality: N/A;   POLYPECTOMY     SHOULDER  ARTHROSCOPY W/ ROTATOR CUFF REPAIR     right   TOTAL THYROIDECTOMY  2007   GOITER   Patient Active Problem List   Diagnosis Date Noted   Arthritis of left midfoot 06/11/2023   Sinus tarsi syndrome of left ankle 04/03/2023   Sinus tarsi syndrome of right ankle 02/18/2023   Edema 02/17/2023   Acute prerenal azotemia 02/06/2023   TIA (transient ischemic attack) 02/05/2023   Paresthesia 08/28/2022   Chronic neck pain 08/28/2022   Neuropathy 08/21/2022   Vitamin deficiency 10/23/2021   Hypoglycemia 10/07/2019   Heart disease 01/29/2019   Sensorineural hearing  loss (SNHL) of both ears 01/27/2019   Primary osteoarthritis of both knees 12/19/2018   DDD (degenerative disc disease), lumbar 12/19/2018   ANA positive 10/27/2018   Anxiety 02/17/2018   Peripheral arterial disease (HCC) 02/20/2017   Skin lesion of face 01/28/2017   Benign paroxysmal positional vertigo 08/05/2016   Antiplatelet or antithrombotic long-term use 03/27/2016   Right lumbar radiculopathy 02/21/2016   Abnormality of gait 02/21/2016   Zinc deficiency 11/26/2015   Right hip pain 10/26/2015   Medicare annual wellness visit, subsequent 03/13/2015   Preventative health care 03/13/2015   Sun-damaged skin 12/05/2014   Angina of effort 12/02/2014   Hx of colonic polyps 09/28/2014   Benign neoplasm of descending colon 09/28/2014   Other fatigue 09/10/2014   Personal history of colonic polyps 07/16/2014   Bilateral thumb pain 03/20/2014   Postsurgical hypoparathyroidism (HCC) 08/03/2013   Neck pain on right side 07/13/2013   Constipation 05/31/2013   Back pain 05/31/2013   Nocturia 05/31/2013   GERD (gastroesophageal reflux disease) 11/02/2012   Tension headache 11/02/2012   Anemia 06/11/2012   Hypokalemia 06/11/2012   Depression 01/21/2012   Headache(784.0) 12/23/2011   Sinus bradycardia 12/07/2011   Hypocalcemia 12/07/2011   Acute MI, subendocardial (HCC) 11/20/2011   Acquired hypothyroidism 05/20/2009   Essential hypertension, benign 05/20/2009   Hyperlipidemia 03/08/2009   CAD, NATIVE VESSEL 03/08/2009    PCP: Danise Edge, MD REFERRING PROVIDER: Danise Edge, MD  REFERRING DIAG: BPPV  THERAPY DIAG:  Chronic right-sided low back pain with right-sided sciatica  Dizziness and giddiness  Cervicalgia  BPPV (benign paroxysmal positional vertigo), bilateral  Cramp and spasm  Abnormal posture  ONSET DATE: 2014  Rationale for Evaluation and Treatment: Rehabilitation  SUBJECTIVE:   SUBJECTIVE STATEMENT: [name] reports she is experiencing  increased cervical spine, B upper traps pain as well as her chronic R post hip and lat thigh pain.  Also intermittent episodes of severe vertigo.  Reports pain has been more intense in neck and lower back for last 2 week.  Does not have a driver so does not want to be assessed for vertigo today. Also after further discussion does not want any Rx of her cervical spine due to concern that further head movements might trigger more intense vertigo  PERTINENT HISTORY: h/o chronic vertigo, referred to PT by PCP to assess for BPPV  PAIN:  Are you having pain? Yes: NPRS scale: 6/10 Pain location: right side lower back  Pain description: tender to touch Aggravating factors: twisting, bending Relieving factors: nothing  PRECAUTIONS: None  WEIGHT BEARING RESTRICTIONS: No  FALLS: Has patient fallen in last 6 months? No  LIVING ENVIRONMENT: Lives with: lives alone Lives in: House/apartment Stairs: No Has following equipment at home: None  PLOF: Independent  PATIENT GOALS: get rid of dizziness, nausea  OBJECTIVE:   DIAGNOSTIC FINDINGS: MRI at hospital recently brain and CT  due to TIA/ stroke Sx , and both with negative results  COGNITION: Overall cognitive status: Within functional limits for tasks assessed   SENSATION: WFL  EDEMA:  None noted   POSTURE:  rounded shoulders, forward head, and increased thoracic kyphosis Lumbar ROM:   Patient fearful to perform standing lumbar ROM due to vertigo Cervical ROM:    Active A/PROM (deg) eval AROM   Flexion 70% 60% guarded  Extension 50% 60% guarded  Right lateral flexion    Left lateral flexion    Right rotation 50% 70  Left rotation 40% 55  (Blank rows = not tested)  STRENGTH: L thumb extension 3+/5, all other MMT elbow and distally wnl, shoulders not assessed, currently under care for R shoulder pain/ injury 07/09/23 UE strength NA today LOWER EXTREMITY MMT: grossly wnl, 04/18/23:  reassessed and patient with pain with locked  bridge l LE , pain R SI jt line  07/09/23: MMT B LE's wfl BED MOBILITY:  Sit to supine Modified independence Supine to sit Modified independence Rolling to Right Modified independence Rolling to Left Modified independence  TRANSFERS:all wnl RAMP:  na  CURB:  na  GAIT: Gait pattern: WFL Distance walked: in clinic over 50' Assistive device utilized: None Level of assistance: Complete Independence  SPECIAL TESTS: SI provocative testing:  thigh thrust + R, faber's + R, march test + R  Unilateral stance wnl B  Palpation:  pt tender , exquisitely tender R post hip musculature, gluteus medius and TFL.  Decreased mobility lower lumbar spine for PA movement, central pain, no referred pain   PATIENT SURVEYS:  DHI 47/80 or 42% deficit  VESTIBULAR ASSESSMENT:  GENERAL OBSERVATION: guarded movements, frequently closes eyes , slow movements, avoids neck rotation with movement transitions and resting posture   SYMPTOM BEHAVIOR:  Subjective history: 10 yr h.o vertigo, no previous Rx  Non-Vestibular symptoms: neck pain, nausea/vomiting, and numbness l side of mouth  Type of dizziness: Spinning/Vertigo and Lightheadedness/Faint  Frequency: daily  Duration: a few secs to several mins  Aggravating factors: Induced by position change: rolling to the right, rolling to the left, and supine to sit, Induced by motion: bending down to the ground, turning body quickly, and turning head quickly, Worse in the dark, and Moving eyes  Relieving factors: head stationary and closing eyes  Progression of symptoms: worse  OCULOMOTOR EXAM:  Ocular Alignment: normal  Ocular ROM: No Limitations  Spontaneous Nystagmus: right beating  Gaze-Induced Nystagmus: right beating with right gaze  Smooth Pursuits: saccades  Saccades: hypermetric/overshoots and extra eye movements  Convergence/Divergence:na today    Gaze-Induced Nystagmus: absent   Positional tests: Right Dix-Hallpike: no nystagmus Left  Dix-Hallpike: no nystagmus Right Roll Test: symptoms worse on left side Left Roll Test: symptoms worse on left side  VESTIBULAR - OCULAR REFLEX: nt today  POSITIONAL TESTING: Right Dix-Hallpike: no nystagmus Left Dix-Hallpike: no nystagmus Right Roll Test: no nystagmus Left Roll Test: no nystagmus  MOTION SENSITIVITY:  Motion Sensitivity Quotient Intensity: 0 = none, 1 = Lightheaded, 2 = Mild, 3 = Moderate, 4 = Severe, 5 = Vomiting  Intensity  1. Sitting to supine   2. Supine to L side   3. Supine to R side   4. Supine to sitting   5. L Hallpike-Dix   6. Up from L    7. R Hallpike-Dix   8. Up from R    9. Sitting, head tipped to L knee   10. Head up from L knee  11. Sitting, head tipped to R knee   12. Head up from R knee   13. Sitting head turns x5   14.Sitting head nods x5   15. In stance, 180 turn to L    16. In stance, 180 turn to R     OTHOSTATICS: not done  FUNCTIONAL GAIT:  na today   TREATMENT:                                                                                                   DATE:  07/09/23:  Manual :  prone for cross friction massage R gluteals, multifidus B, R Tfl Trigger Point Dry-Needling  Treatment instructions: Expect mild to moderate muscle soreness. S/S of pneumothorax if dry needled over a lung field, and to seek immediate medical attention should they occur. Patient verbalized understanding of these instructions and education. Patient Consent Given: Yes Education handout provided: Previously provided Muscles treated: R glut medius and R TFL Electrical stimulation performed: No Parameters: N/A Treatment response/outcome: Twitch Response Elicited  Followed dry needling with thoracic roller, in prone, massage over lumbar and B gluts region   06/10/23 Manual Therapy: to decrease muscle spasm and pain and improve mobility STM/TPR to bil lumbar paraspinals, IASTM with foam roller to bil glutes, UPA mobs lumbar spine, skilled palpation and  monitoring during dry needling. Trigger Point Dry-Needling  Treatment instructions: Expect mild to moderate muscle soreness. S/S of pneumothorax if dry needled over a lung field, and to seek immediate medical attention should they occur. Patient verbalized understanding of these instructions and education. Patient Consent Given: Yes Education handout provided: No Muscles treated: R piriformis, R glut med Treatment response/outcome: Twitch Response Elicited and Palpable Increase in Muscle Length Modalities: Estim (IFC) to lumbar spine with MHP x 15 min in prone.    06/04/2023 Therapeutic Exercise: to improve strength and mobility.  Demo, verbal and tactile cues throughout for technique.  Vitals 110/62 LTR - focusing on side of preference SKTC - focusing on side of preference MET with resisted R hip extension 5 x 5 sec holds Isometric hip adduction/abduction 5 x 5 sec holds Manual Therapy: to decrease muscle spasm and pain and improve mobility IASTM to R glutes, QL, lumbar paraspinals, R UPA mobs lumbar spine grade 2-3, pin & Stretch to R piriformis Modalities: Estim - Premod to R piriformis and R lumbar erector spinae x 15 min with MHP to decrease muscle spasm and pain.     05/23/23: Manual: Sidelying L for muscle energy to improve rotation and mobility of lumbar spine, 5 sec bouts, 5 reps Cross friction massage R quadratus lumborum Trigger Point Dry-Needling  Treatment instructions: Expect mild to moderate muscle soreness. S/S of pneumothorax if dry needled over a lung field, and to seek immediate medical attention should they occur. Patient verbalized understanding of these instructions and education. Patient Consent Given: Yes Education handout provided: No Muscles treated: R glut max Electrical stimulation performed: No Parameters: N/A Treatment response/outcome: Twitch Response Elicited and Palpable Increase in Muscle Length  Prone for estim with moist heat 15 min post Rx lumbar  region/SI for pain relief     05/07/23: Vestibular treatment: Assessed for and  + brief, 5 sec interval of nystagmus and vertigo in R Dix Halpike position, + for posterior canalithiasis.  Therefore utilized Epley maneuver, one cycle.  Then reassessed and no Sx.  Sx improved with supine to sit as well.  Offered to assess L post canal but pt deferred.  Did utilize dry needling r post hip per pts request:  Trigger Point Dry-Needling  Treatment instructions: Expect mild to moderate muscle soreness. S/S of pneumothorax if dry needled over a lung field, and to seek immediate medical attention should they occur. Patient verbalized understanding of these instructions and education. Patient Consent Given: Yes Education handout provided: Previously provided Muscles treated: r gluteals, minimus, medius, maximal, R TFL   Electrical stimulation performed: No Parameters: N/A Treatment response/outcome: Twitch Response Elicited and Palpable Increase in Muscle Length   Manual: gentle overpressure R post hip/gluteals following dry needling.    PATIENT EDUCATION: Education details: NA Person educated: Patient Education method: Explanation Education comprehension: verbalized understanding  HOME EXERCISE PROGRAM:  GOALS: Goals reviewed with patient? Yes  SHORT TERM GOALS: Target date: 03/12/23  Patient will report compliance with initial HEP.  Baseline:not initiated yet Goal status: IN PROGRESS  2.  Patient will report resolution of dizziness rolling over in bed.  Baseline: consistent dizziness Goal status: IN PROGRESS 03/13/23- improving  3.  Patient will complete further balance testing.  Baseline:  Goal status: IN PROGRESS LONG TERM GOALS: Target date: 06/13/23 ,   1.Patient will be independent with progressed HEP to improve outcomes and carryover.  Baseline: not initiated yet Goal status: IN PROGRESS        2. Patient will demonstrate 26/30 score on FGA to decrease risk of falls.   Baseline: na yet Goal status: IN PROGRESS   Patient will report 56 or greater on DHI to demonstrate improved QOL. Baseline: 47 Goal status: IN PROGRESS Patient will be independent with initial HEP.  Baseline: given Goal status: MET - 11/21/22   LONG TERM GOALS for LS spine pain: Target date: 06/13/2023  updated to 09/03/23, another 8 weeks as needed   Patient will be independent with advanced/ongoing HEP to improve outcomes and carryover.  Baseline:  Goal status: IN PROGRESS 01/31/23- good compliance, advanced today 06/10/23- reports performing exercises 2x/day 07/09/23: in progress  2.  Patient will report 75% improvement in low back pain to improve quality of life. Baseline:   Goal status: In Progress- 11/27/22  60-70% ; 01/31/23- 75% 04/18/23:  lower back pain ebbs and flows, back today and last week due to pain 06/10/23- pain pain has returned.  07/09/23;  ebbs and flows,  3. Patient will report 12% improvement on modified Oswestry to demonstrate improved functional ability. Baseline: 12/19/22 -30% Goal status: MET   01/31/23- 18% impairment. 04/18/23: 14/50 or 28% disability 07/09/23: 16/50 slightly declined   4. Patient will will be able to return to community-based exercise program to maintain progress. Baseline: stopped exercising due to pain Goal status: IN PROGRESS 04/18/23 not exercising consistently 06/10/23- walks 2x/week  07/09/23:  very active , just washed her deck, not consistent in exercise program, very limited with quick and turning movements due to her vertigo   ASSESSMENT:  CLINICAL IMPRESSION: [name] reports increased LBP again today, after performing heavy activities on her deck, around her house.  Also cervical spine pain but did not really treat due to her fear of increasing sx of her chronic  vertigo.   Extremely tender again in R glutes, she requested dry needling but had very poor tolerance for overall, focused on gentle manual therapy which decreased her  pain and spasm somewhat.   [name] continues to demonstrate potential for improvement and would benefit from continued skilled therapy to address impairments. Re addressed her goals for LBP and updated POC.  She may wish for additional vertigo Rx next visit as she will have a driver, her daughter will be in town.   OBJECTIVE IMPAIRMENTS: decreased activity tolerance, decreased ROM, dizziness, and postural dysfunction.   ACTIVITY LIMITATIONS: lifting, bending, sleeping, bed mobility, and reach over head  PARTICIPATION LIMITATIONS: cleaning, laundry, and community activity  PERSONAL FACTORS: Time since onset of injury/illness/exacerbation and 1-2 comorbidities: arthritis multiple sites,HTN  are also affecting patient's functional outcome.   REHAB POTENTIAL: Good  CLINICAL DECISION MAKING: Stable/uncomplicated  EVALUATION COMPLEXITY: Low   PLAN:  PT FREQUENCY: 1-2x/week  PT DURATION: 6 weeks  PLANNED INTERVENTIONS: Therapeutic exercises, Therapeutic activity, Neuromuscular re-education, Balance training, Gait training, Patient/Family education, Self Care, Joint mobilization, and Canalith repositioning  PLAN FOR NEXT SESSION: reassess BPPV as needed and LBP, c spine pain [name], PT, DPT ,OCS 07/09/2023, 1:49 PM

## 2023-07-10 ENCOUNTER — Other Ambulatory Visit: Payer: Self-pay | Admitting: Internal Medicine

## 2023-07-10 ENCOUNTER — Telehealth: Payer: Self-pay | Admitting: Internal Medicine

## 2023-07-10 NOTE — Telephone Encounter (Signed)
Patient is returning call in regards to results. Requesting return call.  

## 2023-07-10 NOTE — Telephone Encounter (Signed)
Pt advised and will keep her 08/12/23 OV.

## 2023-07-11 ENCOUNTER — Other Ambulatory Visit: Payer: Self-pay

## 2023-07-11 ENCOUNTER — Ambulatory Visit: Payer: 59

## 2023-07-11 ENCOUNTER — Other Ambulatory Visit: Payer: Self-pay | Admitting: *Deleted

## 2023-07-11 DIAGNOSIS — R293 Abnormal posture: Secondary | ICD-10-CM | POA: Diagnosis not present

## 2023-07-11 DIAGNOSIS — G8929 Other chronic pain: Secondary | ICD-10-CM

## 2023-07-11 DIAGNOSIS — H8113 Benign paroxysmal vertigo, bilateral: Secondary | ICD-10-CM | POA: Diagnosis not present

## 2023-07-11 DIAGNOSIS — I2510 Atherosclerotic heart disease of native coronary artery without angina pectoris: Secondary | ICD-10-CM

## 2023-07-11 DIAGNOSIS — R42 Dizziness and giddiness: Secondary | ICD-10-CM

## 2023-07-11 DIAGNOSIS — M542 Cervicalgia: Secondary | ICD-10-CM | POA: Diagnosis not present

## 2023-07-11 DIAGNOSIS — R252 Cramp and spasm: Secondary | ICD-10-CM | POA: Diagnosis not present

## 2023-07-11 DIAGNOSIS — M5441 Lumbago with sciatica, right side: Secondary | ICD-10-CM | POA: Diagnosis not present

## 2023-07-11 MED ORDER — CLOPIDOGREL BISULFATE 75 MG PO TABS
75.0000 mg | ORAL_TABLET | Freq: Every day | ORAL | 3 refills | Status: DC
Start: 2023-07-11 — End: 2024-05-20

## 2023-07-11 NOTE — Therapy (Addendum)
OUTPATIENT PHYSICAL THERAPY TREATMENT/Discharge   Patient Name: [name] MRN: [id number] DOB:[birth year], 77 y.o., female Today's Date: 07/11/2023  END OF SESSION:  PT End of Session - 07/11/23 1040     Visit Number 33    Date for PT Re-Evaluation 09/03/23    Authorization Type [employer] Medicare    Progress Note Due on Visit 42    PT Start Time 1048    PT Stop Time 1130    PT Time Calculation (min) 42 min    Activity Tolerance Patient tolerated treatment well;Patient limited by pain    Behavior During Therapy [facility] for tasks assessed/performed              Past Medical History:  Diagnosis Date   Acute MI, subendocardial (HCC)    Arthritis    Back pain 05/31/2013   Benign paroxysmal positional vertigo 08/05/2016   CAD (coronary artery disease)    a. NSTEMI 10/2011: LHC 11/19/11: pLAD 30%, oOM 60%, AVCFX 30%, CFX after OM2 30%, pRCA 60 and 70%, then 99%, AM 80-90% with TIMI 3 flow.  PCI: Promus DES x 2 to RCA; b. 06/2012 Cath: patent RCA stents w/ subtl occl of Acute Marginal (jailed)->Med rx; c. 05/2015 MV: EF 59%, mod mid infsept/inf/ap lat/ap infarct with peri-infarct isch-->Med Rx; d. 02/2016 Cath: LM nl, LAD 81m, RI 50, RCA patent stents.   Colitis    Facial skin lesion 01/28/2017   GERD (gastroesophageal reflux disease)    occasional   Hyperlipidemia    Hypertension    Hypertensive heart disease    a. Echocardiogram 11/19/11: Difficult acoustic windows, EF 60-65%, normal LV wall thickness, grade 1 diastolic dysfunction   Hypoparathyroidism (HCC)    Low zinc level 11/26/2016   Multinodular thyroid    Goiter s/p thyroidectomy in 2007 with post-op  hypocalcemia and post-op hypothyroidism/hypoparathyroidism with hypocalcemia   Neck pain on right side 07/13/2013   Nocturia 05/31/2013   Osteoarthritis    Pain in right axilla 03/13/2016   Palpitations    a. 03/2012 - patient set up for event monitor but did not wear correctly -  she declined wearing a repeat monitor    Post-surgical hypothyroidism    Sun-damaged skin 12/05/2014   Tubular adenoma of colon 06/2011   Unspecified constipation 05/31/2013   Vertigo    Zinc deficiency 11/26/2015   Past Surgical History:  Procedure Laterality Date   CARDIAC CATHETERIZATION N/A 03/08/2016   Procedure: Left Heart Cath and Coronary Angiography;  Surgeon: Corky Crafts, MD;  Location: [facility] INVASIVE CV LAB;  Service: Cardiovascular;  Laterality: N/A;   COLONOSCOPY  Aenomatous polyps   07/05/2011   COLONOSCOPY N/A 09/28/2014   Procedure: COLONOSCOPY;  Surgeon: Meryl Dare, MD;  Location: [facility] ENDOSCOPY;  Service: Endoscopy;  Laterality: N/A;   CORONARY ANGIOPLASTY WITH STENT PLACEMENT     LEFT HEART CATH AND CORONARY ANGIOGRAPHY N/A 12/09/2018   Procedure: LEFT HEART CATH AND CORONARY ANGIOGRAPHY;  Surgeon: Corky Crafts, MD;  Location: [facility] INVASIVE CV LAB;  Service: Cardiovascular;  Laterality: N/A;   LEFT HEART CATHETERIZATION WITH CORONARY ANGIOGRAM N/A 07/17/2012   Procedure: LEFT HEART CATHETERIZATION WITH CORONARY ANGIOGRAM;  Surgeon: Herby Abraham, MD;  Location: [facility] CATH LAB;  Service: Cardiovascular;  Laterality: N/A;   PERCUTANEOUS CORONARY STENT INTERVENTION (PCI-S) N/A 11/19/2011   Procedure: PERCUTANEOUS CORONARY STENT INTERVENTION (PCI-S);  Surgeon: Herby Abraham, MD;  Location: [facility] CATH LAB;  Service: Cardiovascular;  Laterality: N/A;   POLYPECTOMY  SHOULDER ARTHROSCOPY W/ ROTATOR CUFF REPAIR     right   TOTAL THYROIDECTOMY  2007   GOITER   Patient Active Problem List   Diagnosis Date Noted   Arthritis of left midfoot 06/11/2023   Sinus tarsi syndrome of left ankle 04/03/2023   Sinus tarsi syndrome of right ankle 02/18/2023   Edema 02/17/2023   Acute prerenal azotemia 02/06/2023   TIA (transient ischemic attack) 02/05/2023   Paresthesia 08/28/2022   Chronic neck pain 08/28/2022   Neuropathy 08/21/2022   Vitamin deficiency 10/23/2021   Hypoglycemia 10/07/2019   Heart disease  01/29/2019   Sensorineural hearing loss (SNHL) of both ears 01/27/2019   Primary osteoarthritis of both knees 12/19/2018   DDD (degenerative disc disease), lumbar 12/19/2018   ANA positive 10/27/2018   Anxiety 02/17/2018   Peripheral arterial disease (HCC) 02/20/2017   Skin lesion of face 01/28/2017   Benign paroxysmal positional vertigo 08/05/2016   Antiplatelet or antithrombotic long-term use 03/27/2016   Right lumbar radiculopathy 02/21/2016   Abnormality of gait 02/21/2016   Zinc deficiency 11/26/2015   Right hip pain 10/26/2015   Medicare annual wellness visit, subsequent 03/13/2015   Preventative health care 03/13/2015   Sun-damaged skin 12/05/2014   Angina of effort 12/02/2014   Hx of colonic polyps 09/28/2014   Benign neoplasm of descending colon 09/28/2014   Other fatigue 09/10/2014   Personal history of colonic polyps 07/16/2014   Bilateral thumb pain 03/20/2014   Postsurgical hypoparathyroidism (HCC) 08/03/2013   Neck pain on right side 07/13/2013   Constipation 05/31/2013   Back pain 05/31/2013   Nocturia 05/31/2013   GERD (gastroesophageal reflux disease) 11/02/2012   Tension headache 11/02/2012   Anemia 06/11/2012   Hypokalemia 06/11/2012   Depression 01/21/2012   Headache(784.0) 12/23/2011   Sinus bradycardia 12/07/2011   Hypocalcemia 12/07/2011   Acute MI, subendocardial (HCC) 11/20/2011   Acquired hypothyroidism 05/20/2009   Essential hypertension, benign 05/20/2009   Hyperlipidemia 03/08/2009   CAD, NATIVE VESSEL 03/08/2009    PCP: Danise Edge, MD REFERRING PROVIDER: Danise Edge, MD  REFERRING DIAG: BPPV  THERAPY DIAG:  Dizziness and giddiness  Chronic right-sided low back pain with right-sided sciatica  Cervicalgia  BPPV (benign paroxysmal positional vertigo), bilateral  Abnormal posture  ONSET DATE: 2014  Rationale for Evaluation and Treatment: Rehabilitation  SUBJECTIVE:   SUBJECTIVE STATEMENT: [name] reports she  slept better last night, less R sided lateral hip pain,  some R sided neck/ upper traps pain as well  PERTINENT HISTORY: h/o chronic vertigo, referred to PT by PCP to assess for BPPV  PAIN:  Are you having pain? Yes: NPRS scale: 6/10 Pain location: right side lower back  Pain description: tender to touch Aggravating factors: twisting, bending Relieving factors: nothing  PRECAUTIONS: None  WEIGHT BEARING RESTRICTIONS: No  FALLS: Has patient fallen in last 6 months? No  LIVING ENVIRONMENT: Lives with: lives alone Lives in: House/apartment Stairs: No Has following equipment at home: None  PLOF: Independent  PATIENT GOALS: get rid of dizziness, nausea  OBJECTIVE:   DIAGNOSTIC FINDINGS: MRI at hospital recently brain and CT due to TIA/ stroke Sx , and both with negative results  COGNITION: Overall cognitive status: Within functional limits for tasks assessed   SENSATION: WFL  EDEMA:  None noted   POSTURE:  rounded shoulders, forward head, and increased thoracic kyphosis Lumbar ROM:   Patient fearful to perform standing lumbar ROM due to vertigo Cervical ROM:    Active A/PROM (deg) eval AROM  Flexion 70% 60% guarded  Extension 50% 60% guarded  Right lateral flexion    Left lateral flexion    Right rotation 50% 70  Left rotation 40% 55  (Blank rows = not tested)  STRENGTH: L thumb extension 3+/5, all other MMT elbow and distally wnl, shoulders not assessed, currently under care for R shoulder pain/ injury 07/09/23 UE strength NA today LOWER EXTREMITY MMT: grossly wnl, 04/18/23:  reassessed and patient with pain with locked bridge l LE , pain R SI jt line  07/09/23: MMT B LE's wfl BED MOBILITY:  Sit to supine Modified independence Supine to sit Modified independence Rolling to Right Modified independence Rolling to Left Modified independence  TRANSFERS:all wnl RAMP:  na  CURB:  na  GAIT: Gait pattern: WFL Distance walked: in clinic over 50' Assistive  device utilized: None Level of assistance: Complete Independence  SPECIAL TESTS: SI provocative testing:  thigh thrust + R, faber's + R, march test + R  Unilateral stance wnl B  Palpation:  pt tender , exquisitely tender R post hip musculature, gluteus medius and TFL.  Decreased mobility lower lumbar spine for PA movement, central pain, no referred pain   PATIENT SURVEYS:  DHI 47/80 or 42% deficit  VESTIBULAR ASSESSMENT:  GENERAL OBSERVATION: guarded movements, frequently closes eyes , slow movements, avoids neck rotation with movement transitions and resting posture   SYMPTOM BEHAVIOR:  Subjective history: 10 yr h.o vertigo, no previous Rx  Non-Vestibular symptoms: neck pain, nausea/vomiting, and numbness l side of mouth  Type of dizziness: Spinning/Vertigo and Lightheadedness/Faint  Frequency: daily  Duration: a few secs to several mins  Aggravating factors: Induced by position change: rolling to the right, rolling to the left, and supine to sit, Induced by motion: bending down to the ground, turning body quickly, and turning head quickly, Worse in the dark, and Moving eyes  Relieving factors: head stationary and closing eyes  Progression of symptoms: worse  OCULOMOTOR EXAM:  Ocular Alignment: normal  Ocular ROM: No Limitations  Spontaneous Nystagmus: right beating  Gaze-Induced Nystagmus: right beating with right gaze  Smooth Pursuits: saccades  Saccades: hypermetric/overshoots and extra eye movements  Convergence/Divergence:na today    Gaze-Induced Nystagmus: absent   Positional tests: Right Dix-Hallpike: no nystagmus Left Dix-Hallpike: no nystagmus Right Roll Test: symptoms worse on left side Left Roll Test: symptoms worse on left side  VESTIBULAR - OCULAR REFLEX: nt today  POSITIONAL TESTING: Right Dix-Hallpike: no nystagmus Left Dix-Hallpike: no nystagmus Right Roll Test: no nystagmus Left Roll Test: no nystagmus  MOTION SENSITIVITY:  Motion Sensitivity  Quotient Intensity: 0 = none, 1 = Lightheaded, 2 = Mild, 3 = Moderate, 4 = Severe, 5 = Vomiting  Intensity  1. Sitting to supine   2. Supine to L side   3. Supine to R side   4. Supine to sitting   5. L Hallpike-Dix   6. Up from L    7. R Hallpike-Dix   8. Up from R    9. Sitting, head tipped to L knee   10. Head up from L knee   11. Sitting, head tipped to R knee   12. Head up from R knee   13. Sitting head turns x5   14.Sitting head nods x5   15. In stance, 180 turn to L    16. In stance, 180 turn to R     OTHOSTATICS: not done  FUNCTIONAL GAIT:  na today   TREATMENT:  DATE:  07/11/23: Estim:  IFC, 4 pads lumbar region, B paraspinals, crossed current, with moist heat x 15 min for pain relief Manual: Trigger Point Dry-Needling  Treatment instructions: Expect mild to moderate muscle soreness. S/S of pneumothorax if dry needled over a lung field, and to seek immediate medical attention should they occur. Patient verbalized understanding of these instructions and education. Patient Consent Given: Yes Education handout provided: Previously provided Muscles treated: R glut max, R glut med, R upper traps Treatment response/outcome: Twitch Response Elicited and Palpable Increase in Muscle Length  Light soft cross friction massage R gluts, R upper traps   07/09/23:  Manual :  prone for cross friction massage R gluteals, multifidus B, R Tfl Trigger Point Dry-Needling  Treatment instructions: Expect mild to moderate muscle soreness. S/S of pneumothorax if dry needled over a lung field, and to seek immediate medical attention should they occur. Patient verbalized understanding of these instructions and education. Patient Consent Given: Yes Education handout provided: Previously provided Muscles treated: R glut medius and R TFL Electrical stimulation performed: No Parameters:  N/A Treatment response/outcome: Twitch Response Elicited  Followed dry needling with thoracic roller, in prone, massage over lumbar and B gluts region   06/10/23 Manual Therapy: to decrease muscle spasm and pain and improve mobility STM/TPR to bil lumbar paraspinals, IASTM with foam roller to bil glutes, UPA mobs lumbar spine, skilled palpation and monitoring during dry needling. Trigger Point Dry-Needling  Treatment instructions: Expect mild to moderate muscle soreness. S/S of pneumothorax if dry needled over a lung field, and to seek immediate medical attention should they occur. Patient verbalized understanding of these instructions and education. Patient Consent Given: Yes Education handout provided: No Muscles treated: R piriformis, R glut med Treatment response/outcome: Twitch Response Elicited and Palpable Increase in Muscle Length Modalities: Estim (IFC) to lumbar spine with MHP x 15 min in prone.    06/04/2023 Therapeutic Exercise: to improve strength and mobility.  Demo, verbal and tactile cues throughout for technique.  Vitals 110/62 LTR - focusing on side of preference SKTC - focusing on side of preference MET with resisted R hip extension 5 x 5 sec holds Isometric hip adduction/abduction 5 x 5 sec holds Manual Therapy: to decrease muscle spasm and pain and improve mobility IASTM to R glutes, QL, lumbar paraspinals, R UPA mobs lumbar spine grade 2-3, pin & Stretch to R piriformis Modalities: Estim - Premod to R piriformis and R lumbar erector spinae x 15 min with MHP to decrease muscle spasm and pain.     05/23/23: Manual: Sidelying L for muscle energy to improve rotation and mobility of lumbar spine, 5 sec bouts, 5 reps Cross friction massage R quadratus lumborum Trigger Point Dry-Needling  Treatment instructions: Expect mild to moderate muscle soreness. S/S of pneumothorax if dry needled over a lung field, and to seek immediate medical attention should they occur. Patient  verbalized understanding of these instructions and education. Patient Consent Given: Yes Education handout provided: No Muscles treated: R glut max Electrical stimulation performed: No Parameters: N/A Treatment response/outcome: Twitch Response Elicited and Palpable Increase in Muscle Length  Prone for estim with moist heat 15 min post Rx lumbar region/SI for pain relief     05/07/23: Vestibular treatment: Assessed for and  + brief, 5 sec interval of nystagmus and vertigo in R Dix Halpike position, + for posterior canalithiasis.  Therefore utilized Epley maneuver, one cycle.  Then reassessed and no Sx.  Sx improved with supine to sit as well.  Offered to assess L post canal but pt deferred.  Did utilize dry needling r post hip per pts request:  Trigger Point Dry-Needling  Treatment instructions: Expect mild to moderate muscle soreness. S/S of pneumothorax if dry needled over a lung field, and to seek immediate medical attention should they occur. Patient verbalized understanding of these instructions and education. Patient Consent Given: Yes Education handout provided: Previously provided Muscles treated: r gluteals, minimus, medius, maximal, R TFL   Electrical stimulation performed: No Parameters: N/A Treatment response/outcome: Twitch Response Elicited and Palpable Increase in Muscle Length   Manual: gentle overpressure R post hip/gluteals following dry needling.    PATIENT EDUCATION: Education details: NA Person educated: Patient Education method: Explanation Education comprehension: verbalized understanding  HOME EXERCISE PROGRAM:  GOALS: Goals reviewed with patient? Yes  SHORT TERM GOALS: Target date: 03/12/23  Patient will report compliance with initial HEP.  Baseline:not initiated yet Goal status: IN PROGRESS  2.  Patient will report resolution of dizziness rolling over in bed.  Baseline: consistent dizziness Goal status: IN PROGRESS 03/13/23- improving  3.   Patient will complete further balance testing.  Baseline:  Goal status: IN PROGRESS LONG TERM GOALS: Target date: 06/13/23 ,   1.Patient will be independent with progressed HEP to improve outcomes and carryover.  Baseline: not initiated yet Goal status: IN PROGRESS        2. Patient will demonstrate 26/30 score on FGA to decrease risk of falls.  Baseline: na yet Goal status: IN PROGRESS   Patient will report 56 or greater on DHI to demonstrate improved QOL. Baseline: 47 Goal status: IN PROGRESS Patient will be independent with initial HEP.  Baseline: given Goal status: MET - 11/21/22   LONG TERM GOALS for LS spine pain: Target date: 06/13/2023  updated to 09/03/23, another 8 weeks as needed   Patient will be independent with advanced/ongoing HEP to improve outcomes and carryover.  Baseline:  Goal status: IN PROGRESS 01/31/23- good compliance, advanced today 06/10/23- reports performing exercises 2x/day 07/09/23: in progress  2.  Patient will report 75% improvement in low back pain to improve quality of life. Baseline:   Goal status: In Progress- 11/27/22  60-70% ; 01/31/23- 75% 04/18/23:  lower back pain ebbs and flows, back today and last week due to pain 06/10/23- pain pain has returned.  07/09/23;  ebbs and flows,  3. Patient will report 12% improvement on modified Oswestry to demonstrate improved functional ability. Baseline: 12/19/22 -30% Goal status: MET   01/31/23- 18% impairment. 04/18/23: 14/50 or 28% disability 07/09/23: 16/50 slightly declined   4. Patient will will be able to return to community-based exercise program to maintain progress. Baseline: stopped exercising due to pain Goal status: IN PROGRESS 04/18/23 not exercising consistently 06/10/23- walks 2x/week  07/09/23:  very active , just washed her deck, not consistent in exercise program, very limited with quick and turning movements due to her vertigo   ASSESSMENT:  CLINICAL IMPRESSION: [name] reports  continued LBP again today,but some relief from PT earlier this week.  Still chronic vertigo.  Recommended iontophoresis patch R gr trochanter, but she wants to read about it and consult with her brother who is a physician.     Extremely tender again in R glutes, she requested dry needling but had very poor tolerance for overall, focused on gentle manual therapy and the electrical stim  which decreased her pain and spasm somewhat.   [name] continues to demonstrate potential for improvement and would  benefit from continued skilled therapy to address impairments. Re addressed her goals for LBP and updated POC.  She may wish for additional vertigo Rx next week  as she will have a driver, her daughter will be in town.   OBJECTIVE IMPAIRMENTS: decreased activity tolerance, decreased ROM, dizziness, and postural dysfunction.   ACTIVITY LIMITATIONS: lifting, bending, sleeping, bed mobility, and reach over head  PARTICIPATION LIMITATIONS: cleaning, laundry, and community activity  PERSONAL FACTORS: Time since onset of injury/illness/exacerbation and 1-2 comorbidities: arthritis multiple sites,HTN  are also affecting patient's functional outcome.   REHAB POTENTIAL: Good  CLINICAL DECISION MAKING: Stable/uncomplicated  EVALUATION COMPLEXITY: Low   PLAN:  PT FREQUENCY: 1-2x/week  PT DURATION: 6 weeks  PLANNED INTERVENTIONS: Therapeutic exercises, Therapeutic activity, Neuromuscular re-education, Balance training, Gait training, Patient/Family education, Self Care, Joint mobilization, and Canalith repositioning  PLAN FOR NEXT SESSION: reassess BPPV as needed and LBP, c spine pain  L , PT, DPT ,OCS 07/11/2023, 12:52 PM    PHYSICAL THERAPY DISCHARGE SUMMARY  Visits from Start of Care: 33  Current functional level related to goals / functional outcomes: See above   Remaining deficits: See above   Education / Equipment: HEP  Plan:  Patient is being discharged due to  attendance policy after several no-shows.  She will require new order to return to therapy.     Jena Gauss, PT  08/08/2023 8:28 AM

## 2023-07-12 ENCOUNTER — Ambulatory Visit: Payer: 59 | Admitting: Family Medicine

## 2023-07-16 ENCOUNTER — Ambulatory Visit (INDEPENDENT_AMBULATORY_CARE_PROVIDER_SITE_OTHER): Payer: 59 | Admitting: Family Medicine

## 2023-07-16 ENCOUNTER — Ambulatory Visit: Payer: 59 | Admitting: Physical Therapy

## 2023-07-16 VITALS — BP 124/70 | HR 61 | Temp 97.8°F | Resp 16 | Ht 65.0 in | Wt 188.0 lb

## 2023-07-16 DIAGNOSIS — I10 Essential (primary) hypertension: Secondary | ICD-10-CM | POA: Diagnosis not present

## 2023-07-16 DIAGNOSIS — M79671 Pain in right foot: Secondary | ICD-10-CM | POA: Diagnosis not present

## 2023-07-16 DIAGNOSIS — E039 Hypothyroidism, unspecified: Secondary | ICD-10-CM | POA: Diagnosis not present

## 2023-07-16 DIAGNOSIS — E60 Dietary zinc deficiency: Secondary | ICD-10-CM

## 2023-07-16 DIAGNOSIS — E162 Hypoglycemia, unspecified: Secondary | ICD-10-CM | POA: Diagnosis not present

## 2023-07-16 DIAGNOSIS — E782 Mixed hyperlipidemia: Secondary | ICD-10-CM

## 2023-07-16 DIAGNOSIS — N189 Chronic kidney disease, unspecified: Secondary | ICD-10-CM | POA: Insufficient documentation

## 2023-07-16 NOTE — Assessment & Plan Note (Signed)
Intake protein every 2-4 hours

## 2023-07-16 NOTE — Assessment & Plan Note (Addendum)
Hydrate and monitor 

## 2023-07-16 NOTE — Assessment & Plan Note (Signed)
Encourage heart healthy diet such as MIND or DASH diet, increase exercise, avoid trans fats, simple carbohydrates and processed foods, consider a krill or fish or flaxseed oil cap daily.  Tolerating Rosuvastatin 

## 2023-07-16 NOTE — Assessment & Plan Note (Signed)
Supplement and monitor 

## 2023-07-16 NOTE — Assessment & Plan Note (Signed)
On Levothyroxine, continue to monitor 

## 2023-07-16 NOTE — Patient Instructions (Signed)
Add Docusate/Colace to the Senna  Add Benefiber powder once to twice daily  Encouraged increased hydration and fiber in diet. Daily probiotics. If bowels not moving can use MOM 2 tbls po in 4 oz of warm prune juice by mouth every 2-3 days. If no results then repeat in 4 hours with  Dulcolax suppository pr, may repeat again in 4 more hours as needed. Seek care if symptoms worsen. Consider daily Miralax and/or Dulcolax if symptoms persist.

## 2023-07-16 NOTE — Progress Notes (Unsigned)
Subjective:    Patient ID: [name], female    DOB: [birth year], 77 y.o.   MRN: [id number]  No chief complaint on file.   HPI Discussed the use of AI scribe software for clinical note transcription with the patient, who gave verbal consent to proceed.  History of Present Illness         Patient is a 77 yo female in today for follow up on chronic medical concerns. No recent febrile illness or hospitalizations. Denies CP/palp/SOB/HA/congestion/fevers/GI or GU c/o. Taking meds as prescribed    Past Medical History:  Diagnosis Date   Acute MI, subendocardial (HCC)    Arthritis    Back pain 05/31/2013   Benign paroxysmal positional vertigo 08/05/2016   CAD (coronary artery disease)    a. NSTEMI 10/2011: LHC 11/19/11: pLAD 30%, oOM 60%, AVCFX 30%, CFX after OM2 30%, pRCA 60 and 70%, then 99%, AM 80-90% with TIMI 3 flow.  PCI: Promus DES x 2 to RCA; b. 06/2012 Cath: patent RCA stents w/ subtl occl of Acute Marginal (jailed)->Med rx; c. 05/2015 MV: EF 59%, mod mid infsept/inf/ap lat/ap infarct with peri-infarct isch-->Med Rx; d. 02/2016 Cath: LM nl, LAD 58m, RI 50, RCA patent stents.   Colitis    Facial skin lesion 01/28/2017   GERD (gastroesophageal reflux disease)    occasional   Hyperlipidemia    Hypertension    Hypertensive heart disease    a. Echocardiogram 11/19/11: Difficult acoustic windows, EF 60-65%, normal LV wall thickness, grade 1 diastolic dysfunction   Hypoparathyroidism (HCC)    Low zinc level 11/26/2016   Multinodular thyroid    Goiter s/p thyroidectomy in 2007 with post-op  hypocalcemia and post-op hypothyroidism/hypoparathyroidism with hypocalcemia   Neck pain on right side 07/13/2013   Nocturia 05/31/2013   Osteoarthritis    Pain in right axilla 03/13/2016   Palpitations    a. 03/2012 - patient set up for event monitor but did not wear correctly -  she declined wearing a repeat monitor   Post-surgical hypothyroidism    Sun-damaged skin 12/05/2014   Tubular adenoma  of colon 06/2011   Unspecified constipation 05/31/2013   Vertigo    Zinc deficiency 11/26/2015    Past Surgical History:  Procedure Laterality Date   CARDIAC CATHETERIZATION N/A 03/08/2016   Procedure: Left Heart Cath and Coronary Angiography;  Surgeon: Corky Crafts, MD;  Location: [facility] INVASIVE CV LAB;  Service: Cardiovascular;  Laterality: N/A;   COLONOSCOPY  Aenomatous polyps   07/05/2011   COLONOSCOPY N/A 09/28/2014   Procedure: COLONOSCOPY;  Surgeon: Meryl Dare, MD;  Location: [facility]OS[facility];  Service: Endoscopy;  Laterality: N/A;   CORONARY ANGIOPLASTY WITH STENT PLACEMENT     LEFT HEART CATH AND CORONARY ANGIOGRAPHY N/A 12/09/2018   Procedure: LEFT HEART CATH AND CORONARY ANGIOGRAPHY;  Surgeon: Corky Crafts, MD;  Location: [facility] INVASIVE CV LAB;  Service: Cardiovascular;  Laterality: N/A;   LEFT HEART CATHETERIZATION WITH CORONARY ANGIOGRAM N/A 07/17/2012   Procedure: LEFT HEART CATHETERIZATION WITH CORONARY ANGIOGRAM;  Surgeon: Herby Abraham, MD;  Location: [facility] CATH LAB;  Service: Cardiovascular;  Laterality: N/A;   PERCUTANEOUS CORONARY STENT INTERVENTION (PCI-S) N/A 11/19/2011   Procedure: PERCUTANEOUS CORONARY STENT INTERVENTION (PCI-S);  Surgeon: Herby Abraham, MD;  Location: [facility] CATH LAB;  Service: Cardiovascular;  Laterality: N/A;   POLYPECTOMY     SHOULDER ARTHROSCOPY W/ ROTATOR CUFF REPAIR     right   TOTAL THYROIDECTOMY  2007   GOITER  Family History  Problem Relation Age of Onset   Colon cancer Brother    Cancer Brother        COLON   Hypertension Father    Heart disease Father    Heart attack Father    Blindness Sister    Congestive Heart Failure Sister    Hypertension Sister    Healthy Daughter    Healthy Son    Thyroid disease Sister    Cancer Brother        multiple myelomas   Healthy Daughter    Healthy Daughter    Diabetes Neg Hx    Prostate cancer Neg Hx    Breast cancer Neg Hx    Esophageal cancer Neg Hx    Rectal cancer Neg Hx     Stomach cancer Neg Hx     Social History   Socioeconomic History   Marital status: Married    Spouse name: Not on file   Number of children: 4   Years of education: 14   Highest education level: Not on file  Occupational History   Occupation: Sales person at [employer]  Tobacco Use   Smoking status: Never   Smokeless tobacco: Never  Vaping Use   Vaping status: Never Used  Substance and Sexual Activity   Alcohol use: Not Currently   Drug use: No   Sexual activity: Not Currently  Other Topics Concern   Not on file  Social History Narrative   Lives at home alone.  Her son lives near her.   Right-handed.   1 cup coffee per day.   Social Determinants of Health   Financial Resource Strain: Low Risk  (05/07/2022)   Overall Financial Resource Strain (CARDIA)    Difficulty of Paying Living Expenses: Not hard at all  Food Insecurity: No Food Insecurity (06/05/2023)   Hunger Vital Sign    Worried About Running Out of Food in the Last Year: Never true    Ran Out of Food in the Last Year: Never true  Transportation Needs: No Transportation Needs (06/05/2023)   PRAPARE - Administrator, Civil Service (Medical): No    Lack of Transportation (Non-Medical): No  Physical Activity: Sufficiently Active (05/07/2022)   Exercise Vital Sign    Days of Exercise per Week: 7 days    Minutes of Exercise per Session: 30 min  Stress: No Stress Concern Present (05/07/2022)   [employer] of Occupational Health - Occupational Stress Questionnaire    Feeling of Stress : Not at all  Social Connections: Unknown (08/22/2022)   Received from [facility]   Social Network    Social Network: Not on file  Intimate Partner Violence: Not At Risk (02/06/2023)   Humiliation, Afraid, Rape, and Kick questionnaire    Fear of Current or Ex-Partner: No    Emotionally Abused: No    Physically Abused: No    Sexually Abused: No    Outpatient Medications Prior to Visit  Medication Sig Dispense Refill    amLODipine (NORVASC) 10 MG tablet Take 1 tablet (10 mg total) by mouth daily. 90 tablet 3   aspirin EC 81 MG tablet Take 81 mg by mouth daily.     B Complex-C (B-COMPLEX WITH VITAMIN C) tablet Take 1 tablet by mouth daily.     bisoprolol (ZEBETA) 5 MG tablet Take 1 tablet (5 mg total) by mouth daily. 90 tablet 3   calcitRIOL (ROCALTROL) 0.25 MCG capsule TAKE 3 CAPSULES BY MOUTH DAILY 300 capsule 2  Cholecalciferol (VITAMIN D) 125 MCG (5000 UT) CAPS Take 5,000 Units by mouth daily in the afternoon. 100 capsule 3   ciclopirox (PENLAC) 8 % solution Apply topically at bedtime. Apply over nail and surrounding skin. Apply daily over previous coat. After seven (7) days, may remove with alcohol and continue cycle. 6.6 mL 0   clopidogrel (PLAVIX) 75 MG tablet Take 1 tablet (75 mg total) by mouth daily. 90 tablet 3   diazepam (VALIUM) 5 MG tablet Take 1 tablet (5 mg total) by mouth daily as needed for anxiety (MRI). Take 1 one hour prior to MRI and another when arrive 10 tablet 0   furosemide (LASIX) 20 MG tablet TAKE 1 TABLET BY MOUTH ONCE  WEEKLY AS NEEDED 15 tablet 3   gabapentin (NEURONTIN) 100 MG capsule Take 2 capsules (200 mg total) by mouth at bedtime. 180 capsule 0   hydrALAZINE (APRESOLINE) 10 MG tablet TAKE 1/2 TABLET (5 MG) BY MOUTH AS NEEDED FOR BP ABOVE 160 OR GREATER 30 tablet 6   isosorbide mononitrate (IMDUR) 60 MG 24 hr tablet TAKE 1 TABLET BY MOUTH DAILY 90 tablet 3   levothyroxine (SYNTHROID) 125 MCG tablet TAKE 1 TABLET BY MOUTH DAILY  BEFORE BREAKFAST 60 tablet 5   Multiple Vitamin (MULTIVITAMIN WITH MINERALS) TABS tablet Take 1 tablet by mouth daily.     nitroGLYCERIN (NITROSTAT) 0.4 MG SL tablet DISSOLVE 1 TABLET UNDER THE  TONGUE EVERY 5 MINUTES AS NEEDED FOR CHEST PAIN. MAX OF 3 TABLETS IN 15 MINUTES. CALL 911 IF PAIN  PERSISTS. 75 tablet 2   pantoprazole (PROTONIX) 40 MG tablet TAKE 1 TABLET BY MOUTH DAILY 90 tablet 3   REPATHA SURECLICK 140 MG/ML SOAJ INJECT 140MG   SUBCUTANEOUSLY  EVERY 2 WEEKS 6 mL 3   rosuvastatin (CRESTOR) 40 MG tablet Take 1 tablet (40 mg total) by mouth daily. 90 tablet 3   urea (CARMOL) 10 % cream Apply topically as needed. 71 g 0   No facility-administered medications prior to visit.    Allergies  Allergen Reactions   Zetia [Ezetimibe] Other (See Comments)    Myalgia, paresthesias and weakness   Penicillin G Rash    ROS     Objective:    Physical Exam  There were no vitals taken for this visit. Wt Readings from Last 3 Encounters:  06/21/23 188 lb 6.4 oz (85.5 kg)  06/12/23 190 lb (86.2 kg)  06/11/23 190 lb (86.2 kg)    Diabetic Foot Exam - Simple   No data filed    Lab Results  Component Value Date   WBC 10.1 06/02/2023   HGB 13.1 06/02/2023   HCT 39.7 06/02/2023   PLT 256 06/02/2023   GLUCOSE 91 06/21/2023   CHOL 200 02/05/2023   TRIG 178 (H) 02/05/2023   HDL 52 02/05/2023   LDLDIRECT 96.0 11/06/2022   LDLCALC 112 (H) 02/05/2023   ALT 12 06/21/2023   AST 14 06/21/2023   NA 143 06/21/2023   K 4.6 06/21/2023   CL 105 06/21/2023   CREATININE 1.14 (H) 06/21/2023   BUN 23 06/21/2023   CO2 22 06/21/2023   TSH 7.578 (H) 02/06/2023   INR 0.9 02/05/2023   HGBA1C 5.4 02/05/2023    Lab Results  Component Value Date   TSH 7.578 (H) 02/06/2023   Lab Results  Component Value Date   WBC 10.1 06/02/2023   HGB 13.1 06/02/2023   HCT 39.7 06/02/2023   MCV 88.8 06/02/2023   PLT 256 06/02/2023  Lab Results  Component Value Date   NA 143 06/21/2023   K 4.6 06/21/2023   CO2 22 06/21/2023   GLUCOSE 91 06/21/2023   BUN 23 06/21/2023   CREATININE 1.14 (H) 06/21/2023   BILITOT 0.3 06/21/2023   ALKPHOS 92 06/21/2023   AST 14 06/21/2023   ALT 12 06/21/2023   PROT 6.7 06/21/2023   ALBUMIN 4.1 06/21/2023   CALCIUM 9.8 06/21/2023   ANIONGAP 13 06/02/2023   EGFR 50 (L) 06/21/2023   GFR 50.74 (L) 11/06/2022   Lab Results  Component Value Date   CHOL 200 02/05/2023   Lab Results  Component  Value Date   HDL 52 02/05/2023   Lab Results  Component Value Date   LDLCALC 112 (H) 02/05/2023   Lab Results  Component Value Date   TRIG 178 (H) 02/05/2023   Lab Results  Component Value Date   CHOLHDL 3.8 02/05/2023   Lab Results  Component Value Date   HGBA1C 5.4 02/05/2023       Assessment & Plan:  Mixed hyperlipidemia Assessment & Plan: Encourage heart healthy diet such as MIND or DASH diet, increase exercise, avoid trans fats, simple carbohydrates and processed foods, consider a krill or fish or flaxseed oil cap daily.  Tolerating Rosuvastatin   Acquired hypothyroidism Assessment & Plan: On Levothyroxine, continue to monitor    Essential hypertension, benign Assessment & Plan: Well controlled, no changes to meds. Encouraged heart healthy diet such as the DASH diet and exercise as tolerated.     Zinc deficiency Assessment & Plan: Supplement and monitor    Hypoglycemia Assessment & Plan: Intake protein every 2-4 hours   Chronic renal impairment, unspecified CKD stage Assessment & Plan: Hydrate and monitor      Assessment and Plan              Danise Edge, MD

## 2023-07-16 NOTE — Assessment & Plan Note (Signed)
Well controlled, no changes to meds. Encouraged heart healthy diet such as the DASH diet and exercise as tolerated.  °

## 2023-07-17 ENCOUNTER — Other Ambulatory Visit: Payer: Self-pay

## 2023-07-17 LAB — CBC WITH DIFFERENTIAL/PLATELET
Basophils Absolute: 0.1 10*3/uL (ref 0.0–0.1)
Basophils Relative: 0.7 % (ref 0.0–3.0)
Eosinophils Absolute: 0.3 10*3/uL (ref 0.0–0.7)
Eosinophils Relative: 2.4 % (ref 0.0–5.0)
HCT: 43 % (ref 36.0–46.0)
Hemoglobin: 14 g/dL (ref 12.0–15.0)
Lymphocytes Relative: 28.2 % (ref 12.0–46.0)
Lymphs Abs: 3.1 10*3/uL (ref 0.7–4.0)
MCHC: 32.5 g/dL (ref 30.0–36.0)
MCV: 88.2 fl (ref 78.0–100.0)
Monocytes Absolute: 1.1 10*3/uL — ABNORMAL HIGH (ref 0.1–1.0)
Monocytes Relative: 9.8 % (ref 3.0–12.0)
Neutro Abs: 6.5 10*3/uL (ref 1.4–7.7)
Neutrophils Relative %: 58.9 % (ref 43.0–77.0)
Platelets: 289 10*3/uL (ref 150.0–400.0)
RBC: 4.87 Mil/uL (ref 3.87–5.11)
RDW: 14.4 % (ref 11.5–15.5)
WBC: 11 10*3/uL — ABNORMAL HIGH (ref 4.0–10.5)

## 2023-07-17 LAB — T4, FREE: Free T4: 1.63 ng/dL — ABNORMAL HIGH (ref 0.60–1.60)

## 2023-07-17 LAB — COMPREHENSIVE METABOLIC PANEL
ALT: 12 U/L (ref 0–35)
AST: 16 U/L (ref 0–37)
Albumin: 4.1 g/dL (ref 3.5–5.2)
Alkaline Phosphatase: 84 U/L (ref 39–117)
BUN: 25 mg/dL — ABNORMAL HIGH (ref 6–23)
CO2: 30 mEq/L (ref 19–32)
Calcium: 9.8 mg/dL (ref 8.4–10.5)
Chloride: 101 mEq/L (ref 96–112)
Creatinine, Ser: 1.2 mg/dL (ref 0.40–1.20)
GFR: 44 mL/min — ABNORMAL LOW (ref >60.00)
Glucose, Bld: 84 mg/dL (ref 70–99)
Potassium: 4.9 mEq/L (ref 3.5–5.1)
Sodium: 142 mEq/L (ref 135–145)
Total Bilirubin: 0.5 mg/dL (ref 0.2–1.2)
Total Protein: 7.2 g/dL (ref 6.0–8.3)

## 2023-07-17 LAB — URIC ACID: Uric Acid, Serum: 7 mg/dL (ref 2.4–7.0)

## 2023-07-17 LAB — HEMOGLOBIN A1C: Hgb A1c MFr Bld: 5.8 % (ref 4.6–6.5)

## 2023-07-17 LAB — TSH: TSH: 0.15 u[IU]/mL — ABNORMAL LOW (ref 0.35–5.50)

## 2023-07-17 MED ORDER — LEVOTHYROXINE SODIUM 125 MCG PO TABS: 125.0000 ug | ORAL_TABLET | Freq: Four times a day (QID) | ORAL | 1 refills | Status: DC

## 2023-07-17 MED ORDER — LEVOTHYROXINE SODIUM 112 MCG PO TABS: 112.0000 ug | ORAL_TABLET | ORAL | 1 refills | Status: DC

## 2023-07-19 ENCOUNTER — Ambulatory Visit: Payer: 59 | Admitting: Physical Therapy

## 2023-07-23 ENCOUNTER — Encounter: Payer: 59 | Admitting: Physical Therapy

## 2023-07-25 ENCOUNTER — Ambulatory Visit: Payer: 59 | Admitting: Family Medicine

## 2023-07-29 ENCOUNTER — Other Ambulatory Visit: Payer: Self-pay | Admitting: Family Medicine

## 2023-08-02 NOTE — Progress Notes (Unsigned)
[name] [address] [place] [address] Phone: [phone] Subjective:   INadine Counts, am serving as a scribe for Dr. Antoine Primas.  I'm seeing this patient by the request  of:  Bradd Canary, MD  CC: Left foot pain  [id number]  06/11/2023 Patient does have midfoot arthritis noted.  Given injection today and tolerated the procedure well.  Hopefully this will make some improvement noted.  Discussed with patient to continue to monitor.  Patient still not wearing great shoes and we discussed more rigid bottom shoes would be beneficial.  Patient will follow-up again in 2 months because she is thinking about going back to [place].      Update 08/05/2023 [name] is a 77 y.o. female coming in with complaint of L foot pain.  Found to have more midfoot arthritis.  Given injection 2 months ago.  Was to get different shoes, orthotics, icing regimen and topical anti-inflammatories.  Patient states patient states B foot pain. Injection helped a little. Still working today, but may want another today. L is worse than right. Sometimes pain will stop her from doing activity.     Past Medical History:  Diagnosis Date   Acute MI, subendocardial (HCC)    Arthritis    Back pain 05/31/2013   Benign paroxysmal positional vertigo 08/05/2016   CAD (coronary artery disease)    a. NSTEMI 10/2011: LHC 11/19/11: pLAD 30%, oOM 60%, AVCFX 30%, CFX after OM2 30%, pRCA 60 and 70%, then 99%, AM 80-90% with TIMI 3 flow.  PCI: Promus DES x 2 to RCA; b. 06/2012 Cath: patent RCA stents w/ subtl occl of Acute Marginal (jailed)->Med rx; c. 05/2015 MV: EF 59%, mod mid infsept/inf/ap lat/ap infarct with peri-infarct isch-->Med Rx; d. 02/2016 Cath: LM nl, LAD 53m, RI 50, RCA patent stents.   Colitis    Facial skin lesion 01/28/2017   GERD (gastroesophageal reflux disease)    occasional   Hyperlipidemia    Hypertension    Hypertensive heart disease    a. Echocardiogram  11/19/11: Difficult acoustic windows, EF 60-65%, normal LV wall thickness, grade 1 diastolic dysfunction   Hypoparathyroidism (HCC)    Low zinc level 11/26/2016   Multinodular thyroid    Goiter s/p thyroidectomy in 2007 with post-op  hypocalcemia and post-op hypothyroidism/hypoparathyroidism with hypocalcemia   Neck pain on right side 07/13/2013   Nocturia 05/31/2013   Osteoarthritis    Pain in right axilla 03/13/2016   Palpitations    a. 03/2012 - patient set up for event monitor but did not wear correctly -  she declined wearing a repeat monitor   Post-surgical hypothyroidism    Sun-damaged skin 12/05/2014   Tubular adenoma of colon 06/2011   Unspecified constipation 05/31/2013   Vertigo    Zinc deficiency 11/26/2015   Past Surgical History:  Procedure Laterality Date   CARDIAC CATHETERIZATION N/A 03/08/2016   Procedure: Left Heart Cath and Coronary Angiography;  Surgeon: Corky Crafts, MD;  Location: [facility] INVASIVE CV LAB;  Service: Cardiovascular;  Laterality: N/A;   COLONOSCOPY  Aenomatous polyps   07/05/2011   COLONOSCOPY N/A 09/28/2014   Procedure: COLONOSCOPY;  Surgeon: Meryl Dare, MD;  Location: [facility]OS[facility];  Service: Endoscopy;  Laterality: N/A;   CORONARY ANGIOPLASTY WITH STENT PLACEMENT     LEFT HEART CATH AND CORONARY ANGIOGRAPHY N/A 12/09/2018   Procedure: LEFT HEART CATH AND CORONARY ANGIOGRAPHY;  Surgeon: Corky Crafts, MD;  Location: [facility] INVASIVE CV LAB;  Service: Cardiovascular;  Laterality: N/A;   LEFT HEART CATHETERIZATION WITH CORONARY ANGIOGRAM N/A 07/17/2012   Procedure: LEFT HEART CATHETERIZATION WITH CORONARY ANGIOGRAM;  Surgeon: Herby Abraham, MD;  Location[facility] CATH LAB;  Service: Cardiovascular;  Laterality: N/A;   PERCUTANEOUS CORONARY STENT INTERVENTION (PCI-S) N/A 11/19/2011   Procedure: PERCUTANEOUS CORONARY STENT INTERVENTION (PCI-S);  Surgeon: Herby Abraham, MD;  Location: [facility] CATH LAB;  Service: Cardiovascular;  Laterality: N/A;   POLYPECTOMY      SHOULDER ARTHROSCOPY W/ ROTATOR CUFF REPAIR     right   TOTAL THYROIDECTOMY  2007   GOITER   Social History   Socioeconomic History   Marital status: Married    Spouse name: Not on file   Number of children: 4   Years of education: 14   Highest education level: Not on file  Occupational History   Occupation: Sales person at [employer]  Tobacco Use   Smoking status: Never   Smokeless tobacco: Never  Vaping Use   Vaping status: Never Used  Substance and Sexual Activity   Alcohol use: Not Currently   Drug use: No   Sexual activity: Not Currently  Other Topics Concern   Not on file  Social History Narrative   Lives at home alone.  Her son lives near her.   Right-handed.   1 cup coffee per day.   Social Determinants of Health   Financial Resource Strain: Low Risk  (05/07/2022)   Overall Financial Resource Strain (CARDIA)    Difficulty of Paying Living Expenses: Not hard at all  Food Insecurity: No Food Insecurity (06/05/2023)   Hunger Vital Sign    Worried About Running Out of Food in the Last Year: Never true    Ran Out of Food in the Last Year: Never true  Transportation Needs: No Transportation Needs (06/05/2023)   PRAPARE - Administrator, Civil Service (Medical): No    Lack of Transportation (Non-Medical): No  Physical Activity: Sufficiently Active (05/07/2022)   Exercise Vital Sign    Days of Exercise per Week: 7 days    Minutes of Exercise per Session: 30 min  Stress: No Stress Concern Present (05/07/2022)   [employer] of Occupational Health - Occupational Stress Questionnaire    Feeling of Stress : Not at all  Social Connections: Unknown (08/22/2022)   [facility]   Social Network    Social Network: Not on file   Allergies  Allergen Reactions   Zetia [Ezetimibe] Other (See Comments)    Myalgia, paresthesias and weakness   Penicillin G Rash   Family History  Problem Relation Age of Onset   Colon cancer Brother     Cancer Brother        COLON   Hypertension Father    Heart disease Father    Heart attack Father    Blindness Sister    Congestive Heart Failure Sister    Hypertension Sister    Healthy Daughter    Healthy Son    Thyroid disease Sister    Cancer Brother        multiple myelomas   Healthy Daughter    Healthy Daughter    Diabetes Neg Hx    Prostate cancer Neg Hx    Breast cancer Neg Hx    Esophageal cancer Neg Hx    Rectal cancer Neg Hx    Stomach cancer Neg Hx     Current Outpatient Medications (Endocrine & Metabolic):    calcitRIOL (  ROCALTROL) 0.25 MCG capsule, TAKE 3 CAPSULES BY MOUTH DAILY   levothyroxine (SYNTHROID) 112 MCG tablet, Take 1 tablet (112 mcg total) by mouth 3 (three) times a week. Take Tuesday,Thursday,Sunday.   levothyroxine (SYNTHROID) 125 MCG tablet, Take 1 tablet (125 mcg total) by mouth 4 (four) times daily. Take 1 tab 125 mcg Monday,Wednesday,Friday,Saturday  Current Outpatient Medications (Cardiovascular):    amLODipine (NORVASC) 10 MG tablet, TAKE 1 TABLET BY MOUTH DAILY   bisoprolol (ZEBETA) 5 MG tablet, Take 1 tablet (5 mg total) by mouth daily.   furosemide (LASIX) 20 MG tablet, TAKE 1 TABLET BY MOUTH ONCE  WEEKLY AS NEEDED   hydrALAZINE (APRESOLINE) 10 MG tablet, TAKE 1/2 TABLET (5 MG) BY MOUTH AS NEEDED FOR BP ABOVE 160 OR GREATER   isosorbide mononitrate (IMDUR) 60 MG 24 hr tablet, TAKE 1 TABLET BY MOUTH DAILY   nitroGLYCERIN (NITROSTAT) 0.4 MG SL tablet, DISSOLVE 1 TABLET UNDER THE  TONGUE EVERY 5 MINUTES AS NEEDED FOR CHEST PAIN. MAX OF 3 TABLETS IN 15 MINUTES. CALL 911 IF PAIN  PERSISTS.   REPATHA SURECLICK 140 MG/ML SOAJ, INJECT 140MG  SUBCUTANEOUSLY  EVERY 2 WEEKS   rosuvastatin (CRESTOR) 40 MG tablet, Take 1 tablet (40 mg total) by mouth daily.   Current Outpatient Medications (Analgesics):    aspirin EC 81 MG tablet, Take 81 mg by mouth daily.  Current Outpatient Medications (Hematological):    clopidogrel (PLAVIX) 75 MG tablet,  Take 1 tablet (75 mg total) by mouth daily.  Current Outpatient Medications (Other):    B Complex-C (B-COMPLEX WITH VITAMIN C) tablet, Take 1 tablet by mouth daily.   Cholecalciferol (VITAMIN D) 125 MCG (5000 UT) CAPS, Take 5,000 Units by mouth daily in the afternoon.   ciclopirox (PENLAC) 8 % solution, Apply topically at bedtime. Apply over nail and surrounding skin. Apply daily over previous coat. After seven (7) days, may remove with alcohol and continue cycle.   diazepam (VALIUM) 5 MG tablet, Take 1 tablet (5 mg total) by mouth daily as needed for anxiety (MRI). Take 1 one hour prior to MRI and another when arrive   gabapentin (NEURONTIN) 100 MG capsule, Take 2 capsules (200 mg total) by mouth at bedtime.   Multiple Vitamin (MULTIVITAMIN WITH MINERALS) TABS tablet, Take 1 tablet by mouth daily.   pantoprazole (PROTONIX) 40 MG tablet, TAKE 1 TABLET BY MOUTH DAILY   urea (CARMOL) 10 % cream, Apply topically as needed.     Objective  Blood pressure 128/72, pulse 66, height 5\' 5"  (1.651 m), weight 192 lb (87.1 kg), SpO2 97%.   General: No apparent distress alert and oriented x3 mood and affect normal, dressed appropriately.  HEENT: Pupils equal, extraocular movements intact  Respiratory: Patient's speak in full sentences and does not appear short of breath  Cardiovascular: No lower extremity edema, non tender, no erythema  Foot exam shows patient does have a rigid midfoot noted.  Seems to be left greater than right.  Patient does have tenderness to palpation over the midfoot.  Limited muscular skeletal ultrasound was performed and interpreted by Antoine Primas, M  Limited ultrasound does not show any significant hypoechoic changes of the midfoot.  Patient does have some narrowing but nothing severe. Impression: Midfoot arthritis with no swelling    Impression and Recommendations:    The above documentation has been reviewed and is accurate and complete Judi Saa, DO

## 2023-08-05 ENCOUNTER — Other Ambulatory Visit: Payer: Self-pay

## 2023-08-05 ENCOUNTER — Ambulatory Visit (INDEPENDENT_AMBULATORY_CARE_PROVIDER_SITE_OTHER): Payer: 59 | Admitting: Family Medicine

## 2023-08-05 ENCOUNTER — Encounter: Payer: Self-pay | Admitting: Family Medicine

## 2023-08-05 VITALS — BP 128/72 | HR 66 | Ht 65.0 in | Wt 192.0 lb

## 2023-08-05 DIAGNOSIS — M19072 Primary osteoarthritis, left ankle and foot: Secondary | ICD-10-CM

## 2023-08-05 NOTE — Patient Instructions (Signed)
Do not lace shoes at mid foot Keep wearing sandals in house See you again in 3-4 months

## 2023-08-05 NOTE — Assessment & Plan Note (Signed)
Patient has responded extremely well to the injection at this time.  The patient is still somewhat noncompliant with using certain recovery sandals that I think can be beneficial.  Discussed icing regimen and home exercises in which activities to avoid.  We discussed proper shoes again.  Patient may even tried to tie shoes differently where there is no pressure on the midfoot.  Follow-up again in 6 to 8 weeks otherwise.

## 2023-08-07 ENCOUNTER — Other Ambulatory Visit: Payer: Self-pay | Admitting: Internal Medicine

## 2023-08-07 DIAGNOSIS — R079 Chest pain, unspecified: Secondary | ICD-10-CM

## 2023-08-07 NOTE — Assessment & Plan Note (Deleted)
Well controlled, no changes to meds. Encouraged heart healthy diet such as the DASH diet and exercise as tolerated.  °

## 2023-08-07 NOTE — Assessment & Plan Note (Deleted)
Hydrate and monitor 

## 2023-08-07 NOTE — Assessment & Plan Note (Deleted)
Encourage heart healthy diet such as MIND or DASH diet, increase exercise, avoid trans fats, simple carbohydrates and processed foods, consider a krill or fish or flaxseed oil cap daily.  Tolerating Rosuvastatin 

## 2023-08-07 NOTE — Assessment & Plan Note (Deleted)
Supplement and monitor 

## 2023-08-07 NOTE — Assessment & Plan Note (Deleted)
On Levothyroxine, continue to monitor 

## 2023-08-08 ENCOUNTER — Ambulatory Visit: Payer: 59 | Admitting: Family Medicine

## 2023-08-08 ENCOUNTER — Telehealth: Payer: Self-pay | Admitting: Family Medicine

## 2023-08-08 DIAGNOSIS — I10 Essential (primary) hypertension: Secondary | ICD-10-CM

## 2023-08-08 DIAGNOSIS — E892 Postprocedural hypoparathyroidism: Secondary | ICD-10-CM

## 2023-08-08 DIAGNOSIS — E782 Mixed hyperlipidemia: Secondary | ICD-10-CM

## 2023-08-08 DIAGNOSIS — M549 Dorsalgia, unspecified: Secondary | ICD-10-CM

## 2023-08-08 DIAGNOSIS — E60 Dietary zinc deficiency: Secondary | ICD-10-CM

## 2023-08-08 DIAGNOSIS — N189 Chronic kidney disease, unspecified: Secondary | ICD-10-CM

## 2023-08-08 DIAGNOSIS — M542 Cervicalgia: Secondary | ICD-10-CM

## 2023-08-08 NOTE — Telephone Encounter (Signed)
Pt came in office stating is needing a referral for Physical Therapy for  back and neck pain, pt stated has been having PT for the issue but needing a new referral to continue getting therapy. Please advise.

## 2023-08-09 NOTE — Telephone Encounter (Signed)
Referral placed.

## 2023-08-11 NOTE — Progress Notes (Unsigned)
Cardiology Office Note   Date:  08/12/2023   ID:  [name], DOB 1946/02/17, MRN [id number]  PCP:  Bradd Canary, MD  Cardiologist:   Dietrich Pates, MD   F/U of CAD    History of Present Illness:  [name] is a 77 y.o. female with CAD - s/p NSTEMI 10/2011 with DESx2  Also has a hx of  GERD, HTN, HLD, vertigo.  L heart cath in Dec 2019 showed:  LAD with 25%  OM  with 100% with Left to R  collaterals  RCA with patent stent   Normal LVEF    Echo in early Sept 2020   LVEF and RVEF were normal   Mild diastolic dysfunction      In 2023 the pt had some numbness on L face   Seen by  neuro, ultimately felt not to represent a TIA  Echo done   Normal   Carotid USN without severe dz   Symptoms felt to be due to DJD of neck    April 2024  Seen in ER for CP/shoulder pain  BP severely elevated   Symptoms eased with NTG    I saw the pt in JUne 2024 Since seen the pt says she is fatigued , weak   She denies CP   Breathing is stable  No palpitations   BP at home 105/45    Current Meds  Medication Sig   aspirin EC 81 MG tablet Take 81 mg by mouth daily.   B Complex-C (B-COMPLEX WITH VITAMIN C) tablet Take 1 tablet by mouth daily.   bisoprolol (ZEBETA) 5 MG tablet Take 1 tablet (5 mg total) by mouth daily.   calcitRIOL (ROCALTROL) 0.25 MCG capsule TAKE 3 CAPSULES BY MOUTH DAILY   Cholecalciferol (VITAMIN D) 125 MCG (5000 UT) CAPS Take 5,000 Units by mouth daily in the afternoon.   ciclopirox (PENLAC) 8 % solution Apply topically at bedtime. Apply over nail and surrounding skin. Apply daily over previous coat. After seven (7) days, may remove with alcohol and continue cycle.   clopidogrel (PLAVIX) 75 MG tablet Take 1 tablet (75 mg total) by mouth daily.   diazepam (VALIUM) 5 MG tablet Take 1 tablet (5 mg total) by mouth daily as needed for anxiety (MRI). Take 1 one hour prior to MRI and another when arrive   furosemide (LASIX) 20 MG tablet TAKE 1 TABLET BY MOUTH ONCE  WEEKLY AS NEEDED    gabapentin (NEURONTIN) 100 MG capsule Take 2 capsules (200 mg total) by mouth at bedtime.   hydrALAZINE (APRESOLINE) 10 MG tablet TAKE 1/2 TABLET (5 MG) BY MOUTH AS NEEDED FOR BP ABOVE 160 OR GREATER   isosorbide mononitrate (IMDUR) 60 MG 24 hr tablet TAKE 1 TABLET BY MOUTH DAILY   Multiple Vitamin (MULTIVITAMIN WITH MINERALS) TABS tablet Take 1 tablet by mouth daily.   nitroGLYCERIN (NITROSTAT) 0.4 MG SL tablet DISSOLVE 1 TABLET UNDER THE  TONGUE EVERY 5 MINUTES AS NEEDED FOR CHEST PAIN. MAX OF 3 TABLETS IN 15 MINUTES. CALL 911 IF PAIN  PERSISTS.   pantoprazole (PROTONIX) 40 MG tablet TAKE 1 TABLET BY MOUTH DAILY   REPATHA SURECLICK 140 MG/ML SOAJ INJECT 140MG  SUBCUTANEOUSLY  EVERY 2 WEEKS   rosuvastatin (CRESTOR) 40 MG tablet Take 1 tablet (40 mg total) by mouth daily.   urea (CARMOL) 10 % cream Apply topically as needed.   [DISCONTINUED] amLODipine (NORVASC) 10 MG tablet TAKE 1 TABLET BY MOUTH DAILY   [DISCONTINUED] levothyroxine (  SYNTHROID) 112 MCG tablet Take 1 tablet (112 mcg total) by mouth 3 (three) times a week. Take Tuesday,Thursday,Sunday.   [DISCONTINUED] levothyroxine (SYNTHROID) 125 MCG tablet Take 1 tablet (125 mcg total) by mouth 4 (four) times daily. Take 1 tab 125 mcg Monday,Wednesday,Friday,Saturday     Allergies:   Zetia [ezetimibe] and Penicillin g   Past Medical History:  Diagnosis Date   Acute MI, subendocardial (HCC)    Arthritis    Back pain 05/31/2013   Benign paroxysmal positional vertigo 08/05/2016   CAD (coronary artery disease)    a. NSTEMI 10/2011: LHC 11/19/11: pLAD 30%, oOM 60%, AVCFX 30%, CFX after OM2 30%, pRCA 60 and 70%, then 99%, AM 80-90% with TIMI 3 flow.  PCI: Promus DES x 2 to RCA; b. 06/2012 Cath: patent RCA stents w/ subtl occl of Acute Marginal (jailed)->Med rx; c. 05/2015 MV: EF 59%, mod mid infsept/inf/ap lat/ap infarct with peri-infarct isch-->Med Rx; d. 02/2016 Cath: LM nl, LAD 30m, RI 50, RCA patent stents.   Colitis    Facial skin lesion  01/28/2017   GERD (gastroesophageal reflux disease)    occasional   Hyperlipidemia    Hypertension    Hypertensive heart disease    a. Echocardiogram 11/19/11: Difficult acoustic windows, EF 60-65%, normal LV wall thickness, grade 1 diastolic dysfunction   Hypoparathyroidism (HCC)    Low zinc level 11/26/2016   Multinodular thyroid    Goiter s/p thyroidectomy in 2007 with post-op  hypocalcemia and post-op hypothyroidism/hypoparathyroidism with hypocalcemia   Neck pain on right side 07/13/2013   Nocturia 05/31/2013   Osteoarthritis    Pain in right axilla 03/13/2016   Palpitations    a. 03/2012 - patient set up for event monitor but did not wear correctly -  she declined wearing a repeat monitor   Post-surgical hypothyroidism    Sun-damaged skin 12/05/2014   Tubular adenoma of colon 06/2011   Unspecified constipation 05/31/2013   Vertigo    Zinc deficiency 11/26/2015    Past Surgical History:  Procedure Laterality Date   CARDIAC CATHETERIZATION N/A 03/08/2016   Procedure: Left Heart Cath and Coronary Angiography;  Surgeon: Corky Crafts, MD;  Location: [facility] INVASIVE CV LAB;  Service: Cardiovascular;  Laterality: N/A;   COLONOSCOPY  Aenomatous polyps   07/05/2011   COLONOSCOPY N/A 09/28/2014   Procedure: COLONOSCOPY;  Surgeon: Meryl Dare, MD;  Location: [facility] ENDOSCOPY;  Service: Endoscopy;  Laterality: N/A;   CORONARY ANGIOPLASTY WITH STENT PLACEMENT     LEFT HEART CATH AND CORONARY ANGIOGRAPHY N/A 12/09/2018   Procedure: LEFT HEART CATH AND CORONARY ANGIOGRAPHY;  Surgeon: Corky Crafts, MD;  Location[facility] INVASIVE CV LAB;  Service: Cardiovascular;  Laterality: N/A;   LEFT HEART CATHETERIZATION WITH CORONARY ANGIOGRAM N/A 07/17/2012   Procedure: LEFT HEART CATHETERIZATION WITH CORONARY ANGIOGRAM;  Surgeon: Herby Abraham, MD;  Location: [facility] CATH LAB;  Service: Cardiovascular;  Laterality: N/A;   PERCUTANEOUS CORONARY STENT INTERVENTION (PCI-S) N/A 11/19/2011   Procedure:  PERCUTANEOUS CORONARY STENT INTERVENTION (PCI-S);  Surgeon: Herby Abraham, MD;  Location[facility] CATH LAB;  Service: Cardiovascular;  Laterality: N/A;   POLYPECTOMY     SHOULDER ARTHROSCOPY W/ ROTATOR CUFF REPAIR     right   TOTAL THYROIDECTOMY  2007   GOITER     Social History:  The patient  reports that she has never smoked. She has never used smokeless tobacco. She reports that she does not currently use alcohol. She reports that she does not use drugs.  Family History:  The patient's family history includes Blindness in her sister; Cancer in her brother and brother; Colon cancer in her brother; Congestive Heart Failure in her sister; Healthy in her daughter, daughter, daughter, and son; Heart attack in her father; Heart disease in her father; Hypertension in her father and sister; Thyroid disease in her sister.    ROS:  Please see the history of present illness. All other systems are reviewed and  Negative to the above problem except as noted.    PHYSICAL EXAM: VS:  BP 124/70   Pulse (!) 57   Ht 5\' 5"  (1.651 m)   Wt 193 lb 3.2 oz (87.6 kg)   SpO2 99%   BMI 32.15 kg/m   GEN: Well nourished, well developed, in no acute distress  HEENT: normal  Neck: JVP is nornal  L bruit Cardiac: RRR; no murmur  No LE  edema  Respiratory:  clear to auscultation  GI: soft, nontender,No hepatomegaly   EKG:  EKG is not  done    Echo   Sept 2023   1. Left ventricular ejection fraction, by estimation, is 60 to 65%. The  left ventricle has normal function. The left ventricle has no regional  wall motion abnormalities. There is mild left ventricular hypertrophy.  Left ventricular diastolic parameters  were normal.   2. Right ventricular systolic function is normal. The right ventricular  size is normal.   3. The mitral valve is abnormal. Trivial mitral valve regurgitation. No  evidence of mitral stenosis.   4. The aortic valve is tricuspid. Aortic valve regurgitation is trivial.  No aortic  stenosis is present.   5. The inferior vena cava is normal in size with greater than 50%  respiratory variability, suggesting right atrial pressure of 3 mmHg.    Lipid Panel    Component Value Date/Time   CHOL 200 02/05/2023 2259   CHOL 172 10/11/2022 1050   TRIG 178 (H) 02/05/2023 2259   HDL 52 02/05/2023 2259   HDL 43 10/11/2022 1050   CHOLHDL 3.8 02/05/2023 2259   VLDL 36 02/05/2023 2259   LDLCALC 112 (H) 02/05/2023 2259   LDLCALC 101 (H) 10/11/2022 1050   LDLDIRECT 96.0 11/06/2022 1623      Wt Readings from Last 3 Encounters:  08/12/23 193 lb 3.2 oz (87.6 kg)  08/05/23 192 lb (87.1 kg)  07/16/23 188 lb (85.3 kg)      ASSESSMENT AND PLAN:  1 Weakness.   Pt has complains of weakness   I ave asked her to cut back on amlodipoine to 5 mg    Follow BP at home  2  CAD  Pt with CAD  She has an occluded OM that fills via collaterals   If BP high this may expplain discomfort (if collateral flow is compromised) Follow    2   HTN  Since BP low at ome will cut back on amlodiine to 5 mg   Follow     3  HL  Last lipids LDL 120   It had been better    She as been on Repatha and Crestor   WIll repeat     Check BMET, VIt D, lipids   Follow up this fall    Current medicines are reviewed at length with the patient today.  The patient does not have concerns regarding medicines.  Signed, Dietrich Pates, MD  08/12/2023 8:19 PM    [facility] [address] [place], [place], [place]  [address] Phone: [phone]; Fax: [phone]

## 2023-08-12 ENCOUNTER — Ambulatory Visit: Payer: 59 | Attending: Internal Medicine | Admitting: Internal Medicine

## 2023-08-12 ENCOUNTER — Encounter: Payer: Self-pay | Admitting: Internal Medicine

## 2023-08-12 VITALS — BP 124/70 | HR 57 | Ht 65.0 in | Wt 193.2 lb

## 2023-08-12 DIAGNOSIS — I2510 Atherosclerotic heart disease of native coronary artery without angina pectoris: Secondary | ICD-10-CM | POA: Diagnosis not present

## 2023-08-12 DIAGNOSIS — M549 Dorsalgia, unspecified: Secondary | ICD-10-CM | POA: Diagnosis not present

## 2023-08-12 DIAGNOSIS — Z79899 Other long term (current) drug therapy: Secondary | ICD-10-CM

## 2023-08-12 DIAGNOSIS — N189 Chronic kidney disease, unspecified: Secondary | ICD-10-CM | POA: Diagnosis not present

## 2023-08-12 DIAGNOSIS — E039 Hypothyroidism, unspecified: Secondary | ICD-10-CM | POA: Diagnosis not present

## 2023-08-12 DIAGNOSIS — E876 Hypokalemia: Secondary | ICD-10-CM | POA: Diagnosis not present

## 2023-08-12 MED ORDER — AMLODIPINE BESYLATE 5 MG PO TABS: 5.0000 mg | ORAL_TABLET | Freq: Every day | ORAL | 3 refills | Status: DC

## 2023-08-12 MED ORDER — AMLODIPINE BESYLATE 10 MG PO TABS: 10.0000 mg | ORAL_TABLET | Freq: Every day | ORAL | 3 refills | Status: DC

## 2023-08-12 MED ORDER — LEVOTHYROXINE SODIUM 112 MCG PO TABS: 112.0000 ug | ORAL_TABLET | ORAL | 1 refills | Status: DC

## 2023-08-12 NOTE — Patient Instructions (Addendum)
Medication Instructions:  DECREASE AMLODIPINE TO 5 MG A DAY  DECREASE SYNTHROID TO 112 MCG A DAY   *If you need a refill on your cardiac medications before your next appointment, please call your pharmacy*   Lab Work: BMET AND VIT D TODAY   If you have labs (blood work) drawn today and your tests are completely normal, you will receive your results only by: MyChart Message (if you have MyChart) OR A paper copy in the mail If you have any lab test that is abnormal or we need to change your treatment, we will call you to review the results.   Testing/Procedures:    Follow-Up: At [facility], you and your health needs are our priority.  As part of our continuing mission to provide you with exceptional heart care, we have created designated Provider Care Teams.  These Care Teams include your primary Cardiologist (physician) and Advanced Practice Providers (APPs -  Physician Assistants and Nurse Practitioners) who all work together to provide you with the care you need, when you need it.  We recommend signing up for the patient portal called "MyChart".  Sign up information is provided on this After Visit Summary.  MyChart is used to connect with patients for Virtual Visits (Telemedicine).  Patients are able to view lab/test results, encounter notes, upcoming appointments, etc.  Non-urgent messages can be sent to your provider as well.   To learn more about what you can do with MyChart, go to [email or web address].    Your next appointment:   8 week(s)  Provider:   Dietrich Pates, MD     Other Instructions REFERRAL TO ENDOCRINOLOGY

## 2023-08-13 LAB — VITAMIN D 25 HYDROXY (VIT D DEFICIENCY, FRACTURES): Vit D, 25-Hydroxy: 43.6 ng/mL (ref 30.0–100.0)

## 2023-08-13 LAB — BASIC METABOLIC PANEL
BUN/Creatinine Ratio: 22 (ref 12–28)
BUN: 25 mg/dL (ref 8–27)
CO2: 25 mmol/L (ref 20–29)
Calcium: 9.2 mg/dL (ref 8.7–10.3)
Chloride: 103 mmol/L (ref 96–106)
Creatinine, Ser: 1.16 mg/dL — ABNORMAL HIGH (ref 0.57–1.00)
Glucose: 101 mg/dL — ABNORMAL HIGH (ref 70–99)
Potassium: 4 mmol/L (ref 3.5–5.2)
Sodium: 145 mmol/L — ABNORMAL HIGH (ref 134–144)
eGFR: 49 mL/min/{1.73_m2} — ABNORMAL LOW (ref >59)

## 2023-08-14 NOTE — Assessment & Plan Note (Signed)
Well controlled, no changes to meds. Encouraged heart healthy diet such as the DASH diet and exercise as tolerated.  °

## 2023-08-14 NOTE — Assessment & Plan Note (Signed)
Supplement and monitor 

## 2023-08-14 NOTE — Assessment & Plan Note (Signed)
On Levothyroxine, continue to monitor 

## 2023-08-14 NOTE — Assessment & Plan Note (Signed)
Encourage heart healthy diet such as MIND or DASH diet, increase exercise, avoid trans fats, simple carbohydrates and processed foods, consider a krill or fish or flaxseed oil cap daily.  Tolerating Rosuvastatin 

## 2023-08-14 NOTE — Assessment & Plan Note (Signed)
Hydrate and monitor 

## 2023-08-14 NOTE — Assessment & Plan Note (Signed)
Managed medically by cardiology

## 2023-08-15 ENCOUNTER — Ambulatory Visit: Payer: 59 | Admitting: Family Medicine

## 2023-08-15 VITALS — BP 118/68 | HR 60 | Temp 98.0°F | Resp 16 | Ht 65.0 in | Wt 194.0 lb

## 2023-08-15 DIAGNOSIS — I10 Essential (primary) hypertension: Secondary | ICD-10-CM | POA: Diagnosis not present

## 2023-08-15 DIAGNOSIS — I739 Peripheral vascular disease, unspecified: Secondary | ICD-10-CM | POA: Diagnosis not present

## 2023-08-15 DIAGNOSIS — N189 Chronic kidney disease, unspecified: Secondary | ICD-10-CM

## 2023-08-15 DIAGNOSIS — E782 Mixed hyperlipidemia: Secondary | ICD-10-CM | POA: Diagnosis not present

## 2023-08-15 DIAGNOSIS — Z86010 Personal history of colonic polyps: Secondary | ICD-10-CM | POA: Diagnosis not present

## 2023-08-15 DIAGNOSIS — E60 Dietary zinc deficiency: Secondary | ICD-10-CM | POA: Diagnosis not present

## 2023-08-15 DIAGNOSIS — E039 Hypothyroidism, unspecified: Secondary | ICD-10-CM

## 2023-08-15 NOTE — Patient Instructions (Signed)
Colon Polyps  Colon polyps are tissue growths inside the colon, which is part of the large intestine. They are one of the types of polyps that can grow in the body. A polyp may be a round bump or a mushroom-shaped growth. You could have one polyp or more than one. Most colon polyps are noncancerous (benign). However, some colon polyps can become cancerous over time. Finding and removing the polyps early can help prevent this. What are the causes? The exact cause of colon polyps is not known. What increases the risk? The following factors may make you more likely to develop this condition: Having a family history of colorectal cancer or colon polyps. Being older than 76 years of age. Being younger than 76 years of age and having a significant family history of colorectal cancer or colon polyps or a genetic condition that puts you at higher risk of getting colon polyps. Having inflammatory bowel disease, such as ulcerative colitis or Crohn's disease. Having certain conditions passed from parent to child (hereditary conditions), such as: Familial adenomatous polyposis (FAP). Lynch syndrome. Turcot syndrome. Peutz-Jeghers syndrome. MUTYH-associated polyposis (MAP). Being overweight. Certain lifestyle factors. These include smoking cigarettes, drinking too much alcohol, not getting enough exercise, and eating a diet that is high in fat and red meat and low in fiber. Having had childhood cancer that was treated with radiation of the abdomen. What are the signs or symptoms? Many times, there are no symptoms. If you have symptoms, they may include: Blood coming from the rectum during a bowel movement. Blood in the stool (feces). The blood may be bright red or very dark in color. Pain in the abdomen. A change in bowel habits, such as constipation or diarrhea. How is this diagnosed? This condition is diagnosed with a colonoscopy. This is a procedure in which a lighted, flexible scope is inserted  into the opening between the buttocks (anus) and then passed into the colon to examine the area. Polyps are sometimes found when a colonoscopy is done as part of routine cancer screening tests. How is this treated? This condition is treated by removing any polyps that are found. Most polyps can be removed during a colonoscopy. Those polyps will then be tested for cancer. Additional treatment may be needed depending on the results of testing. Follow these instructions at home: Eating and drinking  Eat foods that are high in fiber, such as fruits, vegetables, and whole grains. Eat foods that are high in calcium and vitamin D, such as milk, cheese, yogurt, eggs, liver, fish, and broccoli. Limit foods that are high in fat, such as fried foods and desserts. Limit the amount of red meat, precooked or cured meat, or other processed meat that you eat, such as hot dogs, sausages, bacon, or meat loaves. Limit sugary drinks. Lifestyle Maintain a healthy weight, or lose weight if recommended by your health care provider. Exercise every day or as told by your health care provider. Do not use any products that contain nicotine or tobacco, such as cigarettes, e-cigarettes, and chewing tobacco. If you need help quitting, ask your health care provider. Do not drink alcohol if: Your health care provider tells you not to drink. You are pregnant, may be pregnant, or are planning to become pregnant. If you drink alcohol: Limit how much you use to: 0-1 drink a day for women. 0-2 drinks a day for men. Know how much alcohol is in your drink. In the [place] one drink equals one 12 oz bottle of beer (  355 mL), one 5 oz glass of wine (148 mL), or one 1 oz glass of hard liquor (44 mL). General instructions Take over-the-counter and prescription medicines only as told by your health care provider. Keep all follow-up visits. This is important. This includes having regularly scheduled colonoscopies. Talk to your health care  provider about when you need a colonoscopy. Contact a health care provider if: You have new or worsening bleeding during a bowel movement. You have new or increased blood in your stool. You have a change in bowel habits. You lose weight for no known reason. Summary Colon polyps are tissue growths inside the colon, which is part of the large intestine. They are one type of polyp that can grow in the body. Most colon polyps are noncancerous (benign), but some can become cancerous over time. This condition is diagnosed with a colonoscopy. This condition is treated by removing any polyps that are found. Most polyps can be removed during a colonoscopy. This information is not intended to replace advice given to you by your health care provider. Make sure you discuss any questions you have with your health care provider. Document Revised: 03/30/2020 Document Reviewed: 03/30/2020 Elsevier Patient Education  2024 [employer].

## 2023-08-16 ENCOUNTER — Encounter: Payer: Self-pay | Admitting: Family Medicine

## 2023-08-16 NOTE — Progress Notes (Signed)
Subjective:    Patient ID: [name], female    DOB: 31-Oct-1946, 77 y.o.   MRN: [id number]  Chief Complaint  Patient presents with  . Follow-up    Follow up    HPI Discussed the use of AI scribe software for clinical note transcription with the patient, who gave verbal consent to proceed.  History of Present Illness   The patient, with a history of high cholesterol and blood pressure, presents with concerns about their kidney function. They report no specific symptoms related to kidney disease, such as changes in urine output or color, but express anxiety due to slightly elevated creatinine levels. The patient acknowledges the natural decline in organ function with age but is interested in strategies to maintain their kidney health. They report adherence to a healthy diet and hydration, but express difficulty in maintaining regular exercise due to muscle pain.  In addition to kidney concerns, the patient reports changes in bowel movements, describing stools as harder and sometimes less in quantity. They report a bowel movement frequency of most days, but sometimes every other day. The patient is currently managing this with Senna and is open to trying additional interventions such as increased water intake, fiber supplements, and stool softeners.  The patient also mentions muscle pain, particularly in the back, which they prefer to manage without medication. They express a preference for enduring the pain rather than taking over-the-counter pain medications.        Past Medical History:  Diagnosis Date  . Acute MI, subendocardial (HCC)   . Arthritis   . Back pain 05/31/2013  . Benign paroxysmal positional vertigo 08/05/2016  . CAD (coronary artery disease)    a. NSTEMI 10/2011: LHC 11/19/11: pLAD 30%, oOM 60%, AVCFX 30%, CFX after OM2 30%, pRCA 60 and 70%, then 99%, AM 80-90% with TIMI 3 flow.  PCI: Promus DES x 2 to RCA; b. 06/2012 Cath: patent RCA stents w/ subtl occl of Acute  Marginal (jailed)->Med rx; c. 05/2015 MV: EF 59%, mod mid infsept/inf/ap lat/ap infarct with peri-infarct isch-->Med Rx; d. 02/2016 Cath: LM nl, LAD 86m, RI 50, RCA patent stents.  . Colitis   . Facial skin lesion 01/28/2017  . GERD (gastroesophageal reflux disease)    occasional  . Hyperlipidemia   . Hypertension   . Hypertensive heart disease    a. Echocardiogram 11/19/11: Difficult acoustic windows, EF 60-65%, normal LV wall thickness, grade 1 diastolic dysfunction  . Hypoparathyroidism (HCC)   . Low zinc level 11/26/2016  . Multinodular thyroid    Goiter s/p thyroidectomy in 2007 with post-op  hypocalcemia and post-op hypothyroidism/hypoparathyroidism with hypocalcemia  . Neck pain on right side 07/13/2013  . Nocturia 05/31/2013  . Osteoarthritis   . Pain in right axilla 03/13/2016  . Palpitations    a. 03/2012 - patient set up for event monitor but did not wear correctly -  she declined wearing a repeat monitor  . Post-surgical hypothyroidism   . Sun-damaged skin 12/05/2014  . Tubular adenoma of colon 06/2011  . Unspecified constipation 05/31/2013  . Vertigo   . Zinc deficiency 11/26/2015    Past Surgical History:  Procedure Laterality Date  . CARDIAC CATHETERIZATION N/A 03/08/2016   Procedure: Left Heart Cath and Coronary Angiography;  Surgeon: Corky Crafts, MD;  Location[facility] INVASIVE CV LAB;  Service: Cardiovascular;  Laterality: N/A;  . COLONOSCOPY  Aenomatous polyps   07/05/2011  . COLONOSCOPY N/A 09/28/2014   Procedure: COLONOSCOPY;  Surgeon: Meryl Dare, MD;  Location: [facility] ENDOSCOPY;  Service: Endoscopy;  Laterality: N/A;  . CORONARY ANGIOPLASTY WITH STENT PLACEMENT    . LEFT HEART CATH AND CORONARY ANGIOGRAPHY N/A 12/09/2018   Procedure: LEFT HEART CATH AND CORONARY ANGIOGRAPHY;  Surgeon: Corky Crafts, MD;  Location: [facility] INVASIVE CV LAB;  Service: Cardiovascular;  Laterality: N/A;  . LEFT HEART CATHETERIZATION WITH CORONARY ANGIOGRAM N/A 07/17/2012   Procedure: LEFT  HEART CATHETERIZATION WITH CORONARY ANGIOGRAM;  Surgeon: Herby Abraham, MD;  Location: [facility] CATH LAB;  Service: Cardiovascular;  Laterality: N/A;  . PERCUTANEOUS CORONARY STENT INTERVENTION (PCI-S) N/A 11/19/2011   Procedure: PERCUTANEOUS CORONARY STENT INTERVENTION (PCI-S);  Surgeon: Herby Abraham, MD;  Location: [facility] CATH LAB;  Service: Cardiovascular;  Laterality: N/A;  . POLYPECTOMY    . SHOULDER ARTHROSCOPY W/ ROTATOR CUFF REPAIR     right  . TOTAL THYROIDECTOMY  2007   GOITER    Family History  Problem Relation Age of Onset  . Colon cancer Brother   . Cancer Brother        COLON  . Hypertension Father   . Heart disease Father   . Heart attack Father   . Blindness Sister   . Congestive Heart Failure Sister   . Hypertension Sister   . Healthy Daughter   . Healthy Son   . Thyroid disease Sister   . Cancer Brother        multiple myelomas  . Healthy Daughter   . Healthy Daughter   . Diabetes Neg Hx   . Prostate cancer Neg Hx   . Breast cancer Neg Hx   . Esophageal cancer Neg Hx   . Rectal cancer Neg Hx   . Stomach cancer Neg Hx     Social History   Socioeconomic History  . Marital status: Married    Spouse name: Not on file  . Number of children: 4  . Years of education: 25  . Highest education level: Not on file  Occupational History  . Occupation: Sales person at [employer]  . Smoking status: Never  . Smokeless tobacco: Never  Vaping Use  . Vaping status: Never Used  Substance and Sexual Activity  . Alcohol use: Not Currently  . Drug use: No  . Sexual activity: Not Currently  Other Topics Concern  . Not on file  Social History Narrative   Lives at home alone.  Her son lives near her.   Right-handed.   1 cup coffee per day.   Social Determinants of Health   Financial Resource Strain: Low Risk  (05/07/2022)   Overall Financial Resource Strain (CARDIA)   . Difficulty of Paying Living Expenses: Not hard at all  Food Insecurity: No Food  Insecurity (06/05/2023)   Hunger Vital Sign   . Worried About Programme researcher, broadcasting/film/video in the Last Year: Never true   . Ran Out of Food in the Last Year: Never true  Transportation Needs: No Transportation Needs (06/05/2023)   PRAPARE - Transportation   . Lack of Transportation (Medical): No   . Lack of Transportation (Non-Medical): No  Physical Activity: Sufficiently Active (05/07/2022)   Exercise Vital Sign   . Days of Exercise per Week: 7 days   . Minutes of Exercise per Session: 30 min  Stress: No Stress Concern Present (05/07/2022)   [employer] of Occupational Health - Occupational Stress Questionnaire   . Feeling of Stress : Not at all  Social Connections: Unknown (08/22/2022)   [facility] from [facility],  [facility]   Social Network   . Social Network: Not on file  Intimate Partner Violence: Not At Risk (02/06/2023)   Humiliation, Afraid, Rape, and Kick questionnaire   . Fear of Current or Ex-Partner: No   . Emotionally Abused: No   . Physically Abused: No   . Sexually Abused: No    Outpatient Medications Prior to Visit  Medication Sig Dispense Refill  . amLODipine (NORVASC) 5 MG tablet Take 1 tablet (5 mg total) by mouth daily. 90 tablet 3  . aspirin EC 81 MG tablet Take 81 mg by mouth daily.    . B Complex-C (B-COMPLEX WITH VITAMIN C) tablet Take 1 tablet by mouth daily.    . bisoprolol (ZEBETA) 5 MG tablet Take 1 tablet (5 mg total) by mouth daily. 90 tablet 3  . calcitRIOL (ROCALTROL) 0.25 MCG capsule TAKE 3 CAPSULES BY MOUTH DAILY 300 capsule 2  . Cholecalciferol (VITAMIN D) 125 MCG (5000 UT) CAPS Take 5,000 Units by mouth daily in the afternoon. 100 capsule 3  . ciclopirox (PENLAC) 8 % solution Apply topically at bedtime. Apply over nail and surrounding skin. Apply daily over previous coat. After seven (7) days, may remove with alcohol and continue cycle. 6.6 mL 0  . clopidogrel (PLAVIX) 75 MG tablet Take 1 tablet (75 mg total) by mouth daily. 90 tablet 3  . diazepam  (VALIUM) 5 MG tablet Take 1 tablet (5 mg total) by mouth daily as needed for anxiety (MRI). Take 1 one hour prior to MRI and another when arrive 10 tablet 0  . furosemide (LASIX) 20 MG tablet TAKE 1 TABLET BY MOUTH ONCE  WEEKLY AS NEEDED 15 tablet 3  . gabapentin (NEURONTIN) 100 MG capsule Take 2 capsules (200 mg total) by mouth at bedtime. 180 capsule 0  . hydrALAZINE (APRESOLINE) 10 MG tablet TAKE 1/2 TABLET (5 MG) BY MOUTH AS NEEDED FOR BP ABOVE 160 OR GREATER 30 tablet 6  . isosorbide mononitrate (IMDUR) 60 MG 24 hr tablet TAKE 1 TABLET BY MOUTH DAILY 90 tablet 3  . levothyroxine (SYNTHROID) 112 MCG tablet Take 1 tablet (112 mcg total) by mouth 3 (three) times a week. 90 tablet 1  . Multiple Vitamin (MULTIVITAMIN WITH MINERALS) TABS tablet Take 1 tablet by mouth daily.    . nitroGLYCERIN (NITROSTAT) 0.4 MG SL tablet DISSOLVE 1 TABLET UNDER THE  TONGUE EVERY 5 MINUTES AS NEEDED FOR CHEST PAIN. MAX OF 3 TABLETS IN 15 MINUTES. CALL 911 IF PAIN  PERSISTS. 75 tablet 2  . pantoprazole (PROTONIX) 40 MG tablet TAKE 1 TABLET BY MOUTH DAILY 90 tablet 3  . REPATHA SURECLICK 140 MG/ML SOAJ INJECT 140MG  SUBCUTANEOUSLY  EVERY 2 WEEKS 6 mL 3  . rosuvastatin (CRESTOR) 40 MG tablet Take 1 tablet (40 mg total) by mouth daily. 90 tablet 3  . urea (CARMOL) 10 % cream Apply topically as needed. 71 g 0   No facility-administered medications prior to visit.    Allergies  Allergen Reactions  . Zetia [Ezetimibe] Other (See Comments)    Myalgia, paresthesias and weakness  . Penicillin G Rash    Review of Systems  Constitutional:  Positive for malaise/fatigue. Negative for fever.  HENT:  Negative for congestion.   Eyes:  Negative for blurred vision.  Respiratory:  Negative for shortness of breath.   Cardiovascular:  Negative for chest pain, palpitations and leg swelling.  Gastrointestinal:  Positive for constipation. Negative for abdominal pain, blood in stool and nausea.  Genitourinary:  Negative  for dysuria  and frequency.  Musculoskeletal:  Positive for back pain and myalgias. Negative for falls.  Skin:  Negative for rash.  Neurological:  Negative for dizziness, loss of consciousness and headaches.  Endo/Heme/Allergies:  Negative for environmental allergies.  Psychiatric/Behavioral:  Negative for depression. The patient is not nervous/anxious.        Objective:    Physical Exam Constitutional:      General: She is not in acute distress.    Appearance: Normal appearance. She is well-developed. She is not toxic-appearing.  HENT:     Head: Normocephalic and atraumatic.     Right Ear: External ear normal.     Left Ear: External ear normal.     Nose: Nose normal.  Eyes:     General:        Right eye: No discharge.        Left eye: No discharge.     Conjunctiva/sclera: Conjunctivae normal.  Neck:     Thyroid: No thyromegaly.  Cardiovascular:     Rate and Rhythm: Normal rate and regular rhythm.     Heart sounds: Normal heart sounds. No murmur heard. Pulmonary:     Effort: Pulmonary effort is normal. No respiratory distress.     Breath sounds: Normal breath sounds.  Abdominal:     General: Bowel sounds are normal.     Palpations: Abdomen is soft.     Tenderness: There is no abdominal tenderness. There is no guarding.  Musculoskeletal:        General: Normal range of motion.     Cervical back: Neck supple.  Lymphadenopathy:     Cervical: No cervical adenopathy.  Skin:    General: Skin is warm and dry.  Neurological:     Mental Status: She is alert and oriented to person, place, and time.  Psychiatric:        Mood and Affect: Mood normal.        Behavior: Behavior normal.        Thought Content: Thought content normal.        Judgment: Judgment normal.    BP 118/68 (BP Location: Left Arm, Patient Position: Sitting, Cuff Size: Normal)   Pulse 60   Temp 98 F (36.7 C) (Oral)   Resp 16   Ht 5\' 5"  (1.651 m)   Wt 194 lb (88 kg)   SpO2 95%   BMI 32.28 kg/m  Wt Readings  from Last 3 Encounters:  08/15/23 194 lb (88 kg)  08/12/23 193 lb 3.2 oz (87.6 kg)  08/05/23 192 lb (87.1 kg)    Diabetic Foot Exam - Simple   No data filed    Lab Results  Component Value Date   WBC 11.0 (H) 07/16/2023   HGB 14.0 07/16/2023   HCT 43.0 07/16/2023   PLT 289.0 07/16/2023   GLUCOSE 101 (H) 08/12/2023   CHOL 200 02/05/2023   TRIG 178 (H) 02/05/2023   HDL 52 02/05/2023   LDLDIRECT 96.0 11/06/2022   LDLCALC 112 (H) 02/05/2023   ALT 12 07/16/2023   AST 16 07/16/2023   NA 145 (H) 08/12/2023   K 4.0 08/12/2023   CL 103 08/12/2023   CREATININE 1.16 (H) 08/12/2023   BUN 25 08/12/2023   CO2 25 08/12/2023   TSH 0.15 (L) 07/16/2023   INR 0.9 02/05/2023   HGBA1C 5.8 07/16/2023    Lab Results  Component Value Date   TSH 0.15 (L) 07/16/2023   Lab Results  Component Value Date  WBC 11.0 (H) 07/16/2023   HGB 14.0 07/16/2023   HCT 43.0 07/16/2023   MCV 88.2 07/16/2023   PLT 289.0 07/16/2023   Lab Results  Component Value Date   NA 145 (H) 08/12/2023   K 4.0 08/12/2023   CO2 25 08/12/2023   GLUCOSE 101 (H) 08/12/2023   BUN 25 08/12/2023   CREATININE 1.16 (H) 08/12/2023   BILITOT 0.5 07/16/2023   ALKPHOS 84 07/16/2023   AST 16 07/16/2023   ALT 12 07/16/2023   PROT 7.2 07/16/2023   ALBUMIN 4.1 07/16/2023   CALCIUM 9.2 08/12/2023   ANIONGAP 13 06/02/2023   EGFR 49 (L) 08/12/2023   GFR 44.00 (L) 07/16/2023   Lab Results  Component Value Date   CHOL 200 02/05/2023   Lab Results  Component Value Date   HDL 52 02/05/2023   Lab Results  Component Value Date   LDLCALC 112 (H) 02/05/2023   Lab Results  Component Value Date   TRIG 178 (H) 02/05/2023   Lab Results  Component Value Date   CHOLHDL 3.8 02/05/2023   Lab Results  Component Value Date   HGBA1C 5.8 07/16/2023       Assessment & Plan:  Acquired hypothyroidism Assessment & Plan: On Levothyroxine, continue to monitor    Essential hypertension, benign Assessment & Plan: Well  controlled, no changes to meds. Encouraged heart healthy diet such as the DASH diet and exercise as tolerated.     Mixed hyperlipidemia Assessment & Plan: Encourage heart healthy diet such as MIND or DASH diet, increase exercise, avoid trans fats, simple carbohydrates and processed foods, consider a krill or fish or flaxseed oil cap daily.  Tolerating Rosuvastatin   Peripheral arterial disease (HCC) Assessment & Plan: Managed medically by cardiology   Zinc deficiency Assessment & Plan: Supplement and monitor    Chronic renal impairment, unspecified CKD stage Assessment & Plan: Hydrate and monitor    Personal history of colonic polyps -     Ambulatory referral to Gastroenterology    Assessment and Plan    Chronic Kidney Disease Mildly elevated creatinine, indicating reduced kidney function. Discussed the importance of hydration, avoiding certain medications and substances, and maintaining a healthy diet to support kidney health. -Continue monitoring kidney function through regular blood work. -Check urine microalbumin and CMP with GFR to further assess kidney function.  Constipation Reports of harder and less frequent bowel movements. Discussed the importance of hydration, fiber intake, and exercise in maintaining regular bowel movements. -Consider adding a fiber supplement like Benefiber. -Consider over-the-counter stool softeners or mild laxatives like Senna or Pericolace if needed.  Colon Polyp History Due for a follow-up colonoscopy in 2024 due to history of polyps. -Schedule colonoscopy in 2024.  Hyperlipidemia On Repatha and Crestor for cholesterol management. Discussed the benefits of Crestor in reducing dementia risk. -Continue Repatha and Crestor as prescribed.  Follow-up in approximately three months.         Danise Edge, MD

## 2023-08-17 ENCOUNTER — Other Ambulatory Visit: Payer: Self-pay | Admitting: Family Medicine

## 2023-08-17 ENCOUNTER — Other Ambulatory Visit: Payer: Self-pay | Admitting: Internal Medicine

## 2023-08-17 DIAGNOSIS — E892 Postprocedural hypoparathyroidism: Secondary | ICD-10-CM

## 2023-08-19 ENCOUNTER — Other Ambulatory Visit: Payer: Self-pay | Admitting: *Deleted

## 2023-08-19 ENCOUNTER — Telehealth: Payer: Self-pay

## 2023-08-19 DIAGNOSIS — E039 Hypothyroidism, unspecified: Secondary | ICD-10-CM

## 2023-08-19 DIAGNOSIS — E876 Hypokalemia: Secondary | ICD-10-CM

## 2023-08-19 DIAGNOSIS — I10 Essential (primary) hypertension: Secondary | ICD-10-CM

## 2023-08-19 MED ORDER — ROSUVASTATIN CALCIUM 40 MG PO TABS
40.0000 mg | ORAL_TABLET | Freq: Every day | ORAL | 3 refills | Status: DC
Start: 2023-08-19 — End: 2023-12-10

## 2023-08-19 NOTE — Telephone Encounter (Signed)
Pt says she has been taking her VIT D 5000 u daily.Marland KitchenMarland Kitchen I will forward to Dr [name] to see if she would like to alter her dose.

## 2023-08-19 NOTE — Telephone Encounter (Signed)
Continue to take   Will check later this year

## 2023-08-19 NOTE — Telephone Encounter (Signed)
-----   Message from Bivins sent at 08/16/2023  1:07 PM EDT ----- Electrolytes and kidney function are OK Vit D is slightly low    Is she taking the 5000 U daily?

## 2023-08-19 NOTE — Telephone Encounter (Signed)
Left detailed message on her voicemail per her request.

## 2023-08-21 ENCOUNTER — Other Ambulatory Visit: Payer: Self-pay | Admitting: Internal Medicine

## 2023-08-21 DIAGNOSIS — R079 Chest pain, unspecified: Secondary | ICD-10-CM

## 2023-08-23 ENCOUNTER — Encounter: Payer: Self-pay | Admitting: Nurse Practitioner

## 2023-08-29 ENCOUNTER — Ambulatory Visit: Payer: 59 | Attending: Family Medicine | Admitting: Physical Therapy

## 2023-08-29 DIAGNOSIS — M5441 Lumbago with sciatica, right side: Secondary | ICD-10-CM | POA: Insufficient documentation

## 2023-08-29 DIAGNOSIS — M2560 Stiffness of unspecified joint, not elsewhere classified: Secondary | ICD-10-CM | POA: Insufficient documentation

## 2023-08-29 DIAGNOSIS — R293 Abnormal posture: Secondary | ICD-10-CM | POA: Insufficient documentation

## 2023-08-29 DIAGNOSIS — G8929 Other chronic pain: Secondary | ICD-10-CM | POA: Insufficient documentation

## 2023-08-29 DIAGNOSIS — M542 Cervicalgia: Secondary | ICD-10-CM | POA: Insufficient documentation

## 2023-09-09 ENCOUNTER — Ambulatory Visit: Payer: 59 | Admitting: Internal Medicine

## 2023-09-12 ENCOUNTER — Ambulatory Visit: Payer: 59

## 2023-09-12 ENCOUNTER — Other Ambulatory Visit: Payer: Self-pay

## 2023-09-12 DIAGNOSIS — M2560 Stiffness of unspecified joint, not elsewhere classified: Secondary | ICD-10-CM

## 2023-09-12 DIAGNOSIS — M542 Cervicalgia: Secondary | ICD-10-CM | POA: Diagnosis not present

## 2023-09-12 DIAGNOSIS — G8929 Other chronic pain: Secondary | ICD-10-CM

## 2023-09-12 DIAGNOSIS — M5441 Lumbago with sciatica, right side: Secondary | ICD-10-CM | POA: Diagnosis not present

## 2023-09-12 DIAGNOSIS — R293 Abnormal posture: Secondary | ICD-10-CM | POA: Diagnosis not present

## 2023-09-12 NOTE — Therapy (Signed)
OUTPATIENT PHYSICAL THERAPY THORACOLUMBAR EVALUATION   Patient Name: [name] MRN: [id number] DOB:01-14-1946, 77 y.o., female Today's Date: 09/12/2023  END OF SESSION:  PT End of Session - 09/12/23 1437     Visit Number 1    Date for PT Re-Evaluation 12/05/23    Progress Note Due on Visit 10    PT Start Time 1400    PT Stop Time 1445    PT Time Calculation (min) 45 min    Activity Tolerance Patient tolerated treatment well    Behavior During Therapy [facility] for tasks assessed/performed             Past Medical History:  Diagnosis Date   Acute MI, subendocardial (HCC)    Arthritis    Back pain 05/31/2013   Benign paroxysmal positional vertigo 08/05/2016   CAD (coronary artery disease)    a. NSTEMI 10/2011: LHC 11/19/11: pLAD 30%, oOM 60%, AVCFX 30%, CFX after OM2 30%, pRCA 60 and 70%, then 99%, AM 80-90% with TIMI 3 flow.  PCI: Promus DES x 2 to RCA; b. 06/2012 Cath: patent RCA stents w/ subtl occl of Acute Marginal (jailed)->Med rx; c. 05/2015 MV: EF 59%, mod mid infsept/inf/ap lat/ap infarct with peri-infarct isch-->Med Rx; d. 02/2016 Cath: LM nl, LAD 19m, RI 50, RCA patent stents.   Colitis    Facial skin lesion 01/28/2017   GERD (gastroesophageal reflux disease)    occasional   Hyperlipidemia    Hypertension    Hypertensive heart disease    a. Echocardiogram 11/19/11: Difficult acoustic windows, EF 60-65%, normal LV wall thickness, grade 1 diastolic dysfunction   Hypoparathyroidism (HCC)    Low zinc level 11/26/2016   Multinodular thyroid    Goiter s/p thyroidectomy in 2007 with post-op  hypocalcemia and post-op hypothyroidism/hypoparathyroidism with hypocalcemia   Neck pain on right side 07/13/2013   Nocturia 05/31/2013   Osteoarthritis    Pain in right axilla 03/13/2016   Palpitations    a. 03/2012 - patient set up for event monitor but did not wear correctly -  she declined wearing a repeat monitor   Post-surgical hypothyroidism    Sun-damaged skin 12/05/2014    Tubular adenoma of colon 06/2011   Unspecified constipation 05/31/2013   Vertigo    Zinc deficiency 11/26/2015   Past Surgical History:  Procedure Laterality Date   CARDIAC CATHETERIZATION N/A 03/08/2016   Procedure: Left Heart Cath and Coronary Angiography;  Surgeon: Corky Crafts, MD;  Location: [facility] INVASIVE CV LAB;  Service: Cardiovascular;  Laterality: N/A;   COLONOSCOPY  Aenomatous polyps   07/05/2011   COLONOSCOPY N/A 09/28/2014   Procedure: COLONOSCOPY;  Surgeon: Meryl Dare, MD;  Location: [facility] ENDOSCOPY;  Service: Endoscopy;  Laterality: N/A;   CORONARY ANGIOPLASTY WITH STENT PLACEMENT     LEFT HEART CATH AND CORONARY ANGIOGRAPHY N/A 12/09/2018   Procedure: LEFT HEART CATH AND CORONARY ANGIOGRAPHY;  Surgeon: Corky Crafts, MD;  Location: [facility] INVASIVE CV LAB;  Service: Cardiovascular;  Laterality: N/A;   LEFT HEART CATHETERIZATION WITH CORONARY ANGIOGRAM N/A 07/17/2012   Procedure: LEFT HEART CATHETERIZATION WITH CORONARY ANGIOGRAM;  Surgeon: Herby Abraham, MD;  Location: [facility] CATH LAB;  Service: Cardiovascular;  Laterality: N/A;   PERCUTANEOUS CORONARY STENT INTERVENTION (PCI-S) N/A 11/19/2011   Procedure: PERCUTANEOUS CORONARY STENT INTERVENTION (PCI-S);  Surgeon: Herby Abraham, MD;  Location: [facility] CATH LAB;  Service: Cardiovascular;  Laterality: N/A;   POLYPECTOMY     SHOULDER ARTHROSCOPY W/ ROTATOR CUFF REPAIR  right   TOTAL THYROIDECTOMY  2007   GOITER   Patient Active Problem List   Diagnosis Date Noted   CRI (chronic renal insufficiency) 07/16/2023   Arthritis of left midfoot 06/11/2023   Sinus tarsi syndrome of left ankle 04/03/2023   Sinus tarsi syndrome of right ankle 02/18/2023   Edema 02/17/2023   Acute prerenal azotemia 02/06/2023   TIA (transient ischemic attack) 02/05/2023   Paresthesia 08/28/2022   Chronic neck pain 08/28/2022   Neuropathy 08/21/2022   Vitamin deficiency 10/23/2021   Hypoglycemia 10/07/2019   Heart disease 01/29/2019    Sensorineural hearing loss (SNHL) of both ears 01/27/2019   Primary osteoarthritis of both knees 12/19/2018   DDD (degenerative disc disease), lumbar 12/19/2018   ANA positive 10/27/2018   Anxiety 02/17/2018   Peripheral arterial disease (HCC) 02/20/2017   Skin lesion of face 01/28/2017   Benign paroxysmal positional vertigo 08/05/2016   Antiplatelet or antithrombotic long-term use 03/27/2016   Right lumbar radiculopathy 02/21/2016   Abnormality of gait 02/21/2016   Zinc deficiency 11/26/2015   Right hip pain 10/26/2015   Medicare annual wellness visit, subsequent 03/13/2015   Preventative health care 03/13/2015   Sun-damaged skin 12/05/2014   Angina of effort 12/02/2014   Hx of colonic polyps 09/28/2014   Benign neoplasm of descending colon 09/28/2014   Other fatigue 09/10/2014   Personal history of colonic polyps 07/16/2014   Bilateral thumb pain 03/20/2014   Postsurgical hypoparathyroidism (HCC) 08/03/2013   Neck pain on right side 07/13/2013   Constipation 05/31/2013   Back pain 05/31/2013   Nocturia 05/31/2013   GERD (gastroesophageal reflux disease) 11/02/2012   Tension headache 11/02/2012   Anemia 06/11/2012   Hypokalemia 06/11/2012   Depression 01/21/2012   Headache(784.0) 12/23/2011   Sinus bradycardia 12/07/2011   Hypocalcemia 12/07/2011   Acute MI, subendocardial (HCC) 11/20/2011   Acquired hypothyroidism 05/20/2009   Essential hypertension, benign 05/20/2009   Hyperlipidemia 03/08/2009   CAD, NATIVE VESSEL 03/08/2009    PCP: Danise Edge, MD  REFERRING PROVIDER: same  REFERRING DIAG: chronic lower back pain and chronic neck pain  Rationale for Evaluation and Treatment: Rehabilitation  THERAPY DIAG:  Chronic right-sided low back pain with right-sided sciatica - Plan: PT plan of care cert/re-cert  Cervicalgia - Plan: PT plan of care cert/re-cert  Abnormal posture - Plan: PT plan of care cert/re-cert  Joint stiffness of spine - Plan: PT plan of care  cert/re-cert  ONSET DATE: chronic, referred at last PCP visit 08/08/23  SUBJECTIVE:  SUBJECTIVE STATEMENT: My back and neck are so stiff.  I came back to see you guys because what you all do really helps .  I just need to move really slowly and carefully due to vertigo.   PERTINENT HISTORY:  Chronic h/o lower back pain and cervical spine pain.  Quite stiff.  PAIN:  Are you having pain? Yes c spine upper traps and central lower back and across B post hips.,  worse with house work and with fast movements  PRECAUTIONS: None  RED FLAGS: None   WEIGHT BEARING RESTRICTIONS: No  FALLS:  Has patient fallen in last 6 months? No  LIVING ENVIRONMENT: Lives with: lives with their family and lives alone Lives in: House/apartment Stairs: Yes: External: 1 steps; none Has following equipment at home: None  OCCUPATION: retired  PLOF: Independent  PATIENT GOALS: manage pain , get less stiff, able to tolerate prolonged walking,   NEXT MD VISIT: 4 weeks  OBJECTIVE:   DIAGNOSTIC FINDINGS:  na  PATIENT SURVEYS:  Modified Oswestry 21/50  NDI 14/50 28%  SCREENING FOR RED FLAGS: Bowel or bladder incontinence: No Spinal tumors: No Cauda equina syndrome: No Compression fracture: No Abdominal aneurysm: No  COGNITION: Overall cognitive status: Within functional limits for tasks assessed     SENSATION: WFL  MUSCLE LENGTH: Hamstrings: Right wfl deg; Left wfl deg POSTURE: note forward head posture, increased lumbar lordosis, B iliac crests symmetrical  PALPATION: B glut med, glut max, piriformis very tender , painful and post SI lig R upper traps, levator painful CERVICAL ROM:  rotation R 60, L 40, FB 80, ext 30, all with c/o of R upper traps pain particularly at end range LUMBAR ROM:   AROM eval   Flexion note R rotation 25%  Extension noted R rotation 20%  Right lateral flexion 50  Left lateral flexion 50  Right rotation   Left rotation    (Blank rows = not tested)  LOWER EXTREMITY ROM:   all wfl  LOWER EXTREMITY MMTall grossy wnl except B hip abd 4/5     LUMBAR SPECIAL TESTS:    FUNCTIONAL TESTS:  30 sec sit to stand TBD, deferred today due to vertigo  GAIT: Distance walked: 19' in clnic I  TODAY'S TREATMENT:                                                                                                                              DATE: 09/12/23: Manual techniques:  patient positioned in side lying for muscle energy technique to address lumbar spine mobility, more restricted L than R.   Prone for TPDN:Trigger Point Dry-Needling  Treatment instructions: Expect mild to moderate muscle soreness. S/S of pneumothorax if dry needled over a lung field, and to seek immediate medical attention should they occur. Patient verbalized understanding of these instructions and education. Patient Consent Given: Yes Education handout provided: No Muscles treated:  B glut maximus, B glut medius, R upper traps Treatment  response/outcome: Twitch Response Elicited and Palpable Increase in Muscle Length     PATIENT EDUCATION:  Education details: POC, goals Person educated: Patient Education method: Explanation, Demonstration, Tactile cues, and Verbal cues Education comprehension: verbalized understanding  HOME EXERCISE PROGRAM: TBD  ASSESSMENT:  CLINICAL IMPRESSION: Patient is a 77 y.o. female who was evaluated today by skilled physical therapy due to chronic lower back pan and stiffness and chronic R sided cervical spine pain.  She was restricted in her movements throughout the evaluation due to chronic vertigo, and fear of it worsening, so therefore did not position in supine, which limited evaluation somewhat.   She has disability with her neck pain and LBP as indicated by  modified oswestry and NDI.  She also is quite restricted with cervical and lumbar spine movement, ROM, mobility.  She moves slowly and cautiously due to chronic vertigo which probably also contributes to her spine stiffness. She should benefit from skilled physical therapy to address these deficits, responds well to TPDN so will include in her treatment plan, also manual techniques to address B upper traps and post hip muscular restrictions as well as jt mobs to improve spine stiffness, and therex for spinal mobility/stiffness  OBJECTIVE IMPAIRMENTS: decreased activity tolerance, decreased mobility, decreased ROM, dizziness, hypomobility, increased fascial restrictions, impaired flexibility, postural dysfunction, and pain.   ACTIVITY LIMITATIONS: lifting, bending, sitting, standing, squatting, sleeping, transfers, reach over head, and locomotion level  PARTICIPATION LIMITATIONS: cleaning, laundry, shopping, and community activity  PERSONAL FACTORS: Age, Behavior pattern, Fitness, Past/current experiences, Time since onset of injury/illness/exacerbation, and 1-2 comorbidities: HTN, afib  are also affecting patient's functional outcome.   REHAB POTENTIAL: Good  CLINICAL DECISION MAKING: Stable/uncomplicated  EVALUATION COMPLEXITY: Low   GOALS: Goals reviewed with patient? Yes  SHORT TERM GOALS: Target date: 2 weeks 09/26/23  I HEP For c spine and LS spine Baseline:TBD Goal status: INITIAL    LONG TERM GOALS: Target date: 12 weeks12/12/24  Full AROM c spine for r rotation and flexion Baseline: 60 % Goal status: INITIAL  2.  Improve modified oswestry from 21/50 to 32/50 Baseline:  Goal status: INITIAL  3.  Improve NDI from 14/50 to 9/50 Baseline:  Goal status: INITIAL  4.  Full ROM lumbar spine for FB to wnl Baseline: 40 % with R rotation noted in LS spine Goal status: INITIAL  PLAN:  PT FREQUENCY: 1-2x/week  PT DURATION: 12 weeks  PLANNED INTERVENTIONS: Therapeutic  exercises, Therapeutic activity, Neuromuscular re-education, Balance training, Gait training, Patient/Family education, Self Care, and Joint mobilization.  PLAN FOR NEXT SESSION: Manual techniques, TPDN, therex instruction, modalities as indicated   Jerami Tammen L Kaleth Koy, PT, DPT, OCS 09/12/2023, 4:07 PM

## 2023-09-19 ENCOUNTER — Other Ambulatory Visit: Payer: Self-pay

## 2023-09-19 MED ORDER — LEVOTHYROXINE SODIUM 112 MCG PO TABS: 112.0000 ug | ORAL_TABLET | ORAL | 0 refills | Status: DC

## 2023-09-19 MED ORDER — LEVOTHYROXINE SODIUM 112 MCG PO TABS: 112.0000 ug | ORAL_TABLET | ORAL | 1 refills | Status: DC

## 2023-09-19 NOTE — Telephone Encounter (Signed)
Pt's pharmacy is requesting a refill on levothyroxine. Would Dr. [name] like to refill this non cardiac medication? Please address

## 2023-09-26 ENCOUNTER — Other Ambulatory Visit: Payer: Self-pay

## 2023-09-26 ENCOUNTER — Ambulatory Visit: Payer: 59 | Attending: Family Medicine

## 2023-09-26 DIAGNOSIS — M542 Cervicalgia: Secondary | ICD-10-CM | POA: Insufficient documentation

## 2023-09-26 DIAGNOSIS — G8929 Other chronic pain: Secondary | ICD-10-CM | POA: Insufficient documentation

## 2023-09-26 DIAGNOSIS — M2560 Stiffness of unspecified joint, not elsewhere classified: Secondary | ICD-10-CM | POA: Diagnosis not present

## 2023-09-26 DIAGNOSIS — R293 Abnormal posture: Secondary | ICD-10-CM | POA: Insufficient documentation

## 2023-09-26 DIAGNOSIS — M5441 Lumbago with sciatica, right side: Secondary | ICD-10-CM | POA: Diagnosis not present

## 2023-09-26 NOTE — Therapy (Signed)
OUTPATIENT PHYSICAL THERAPY THORACOLUMBAR TREATMENT   Patient Name: [name] MRN: [id number] DOB:1946/04/15, 77 y.o., female Today's Date: 09/26/2023  END OF SESSION:    Past Medical History:  Diagnosis Date   Acute MI, subendocardial (HCC)    Arthritis    Back pain 05/31/2013   Benign paroxysmal positional vertigo 08/05/2016   CAD (coronary artery disease)    a. NSTEMI 10/2011: LHC 11/19/11: pLAD 30%, oOM 60%, AVCFX 30%, CFX after OM2 30%, pRCA 60 and 70%, then 99%, AM 80-90% with TIMI 3 flow.  PCI: Promus DES x 2 to RCA; b. 06/2012 Cath: patent RCA stents w/ subtl occl of Acute Marginal (jailed)->Med rx; c. 05/2015 MV: EF 59%, mod mid infsept/inf/ap lat/ap infarct with peri-infarct isch-->Med Rx; d. 02/2016 Cath: LM nl, LAD 46m, RI 50, RCA patent stents.   Colitis    Facial skin lesion 01/28/2017   GERD (gastroesophageal reflux disease)    occasional   Hyperlipidemia    Hypertension    Hypertensive heart disease    a. Echocardiogram 11/19/11: Difficult acoustic windows, EF 60-65%, normal LV wall thickness, grade 1 diastolic dysfunction   Hypoparathyroidism (HCC)    Low zinc level 11/26/2016   Multinodular thyroid    Goiter s/p thyroidectomy in 2007 with post-op  hypocalcemia and post-op hypothyroidism/hypoparathyroidism with hypocalcemia   Neck pain on right side 07/13/2013   Nocturia 05/31/2013   Osteoarthritis    Pain in right axilla 03/13/2016   Palpitations    a. 03/2012 - patient set up for event monitor but did not wear correctly -  she declined wearing a repeat monitor   Post-surgical hypothyroidism    Sun-damaged skin 12/05/2014   Tubular adenoma of colon 06/2011   Unspecified constipation 05/31/2013   Vertigo    Zinc deficiency 11/26/2015   Past Surgical History:  Procedure Laterality Date   CARDIAC CATHETERIZATION N/A 03/08/2016   Procedure: Left Heart Cath and Coronary Angiography;  Surgeon: Corky Crafts, MD;  Location: [facility] INVASIVE CV LAB;  Service:  Cardiovascular;  Laterality: N/A;   COLONOSCOPY  Aenomatous polyps   07/05/2011   COLONOSCOPY N/A 09/28/2014   Procedure: COLONOSCOPY;  Surgeon: Meryl Dare, MD;  Location: [facility] ENDOSCOPY;  Service: Endoscopy;  Laterality: N/A;   CORONARY ANGIOPLASTY WITH STENT PLACEMENT     LEFT HEART CATH AND CORONARY ANGIOGRAPHY N/A 12/09/2018   Procedure: LEFT HEART CATH AND CORONARY ANGIOGRAPHY;  Surgeon: Corky Crafts, MD;  Location: [facility] INVASIVE CV LAB;  Service: Cardiovascular;  Laterality: N/A;   LEFT HEART CATHETERIZATION WITH CORONARY ANGIOGRAM N/A 07/17/2012   Procedure: LEFT HEART CATHETERIZATION WITH CORONARY ANGIOGRAM;  Surgeon: Herby Abraham, MD;  Location: [facility] CATH LAB;  Service: Cardiovascular;  Laterality: N/A;   PERCUTANEOUS CORONARY STENT INTERVENTION (PCI-S) N/A 11/19/2011   Procedure: PERCUTANEOUS CORONARY STENT INTERVENTION (PCI-S);  Surgeon: Herby Abraham, MD;  Location: [facility] CATH LAB;  Service: Cardiovascular;  Laterality: N/A;   POLYPECTOMY     SHOULDER ARTHROSCOPY W/ ROTATOR CUFF REPAIR     right   TOTAL THYROIDECTOMY  2007   GOITER   Patient Active Problem List   Diagnosis Date Noted   CRI (chronic renal insufficiency) 07/16/2023   Arthritis of left midfoot 06/11/2023   Sinus tarsi syndrome of left ankle 04/03/2023   Sinus tarsi syndrome of right ankle 02/18/2023   Edema 02/17/2023   Acute prerenal azotemia 02/06/2023   TIA (transient ischemic attack) 02/05/2023   Paresthesia 08/28/2022   Chronic neck pain 08/28/2022   Neuropathy  08/21/2022   Vitamin deficiency 10/23/2021   Hypoglycemia 10/07/2019   Heart disease 01/29/2019   Sensorineural hearing loss (SNHL) of both ears 01/27/2019   Primary osteoarthritis of both knees 12/19/2018   DDD (degenerative disc disease), lumbar 12/19/2018   ANA positive 10/27/2018   Anxiety 02/17/2018   Peripheral arterial disease (HCC) 02/20/2017   Skin lesion of face 01/28/2017   Benign paroxysmal positional vertigo  08/05/2016   Antiplatelet or antithrombotic long-term use 03/27/2016   Right lumbar radiculopathy 02/21/2016   Abnormality of gait 02/21/2016   Zinc deficiency 11/26/2015   Right hip pain 10/26/2015   Medicare annual wellness visit, subsequent 03/13/2015   Preventative health care 03/13/2015   Sun-damaged skin 12/05/2014   Angina of effort (HCC) 12/02/2014   Hx of colonic polyps 09/28/2014   Benign neoplasm of descending colon 09/28/2014   Other fatigue 09/10/2014   History of colonic polyps 07/16/2014   Bilateral thumb pain 03/20/2014   Postsurgical hypoparathyroidism (HCC) 08/03/2013   Neck pain on right side 07/13/2013   Constipation 05/31/2013   Back pain 05/31/2013   Nocturia 05/31/2013   GERD (gastroesophageal reflux disease) 11/02/2012   Tension headache 11/02/2012   Anemia 06/11/2012   Hypokalemia 06/11/2012   Depression 01/21/2012   Headache 12/23/2011   Sinus bradycardia 12/07/2011   Hypocalcemia 12/07/2011   Acute MI, subendocardial (HCC) 11/20/2011   Acquired hypothyroidism 05/20/2009   Essential hypertension, benign 05/20/2009   Hyperlipidemia 03/08/2009   CAD, NATIVE VESSEL 03/08/2009    PCP: Danise Edge, MD  REFERRING PROVIDER: same  REFERRING DIAG: chronic lower back pain and chronic neck pain  Rationale for Evaluation and Treatment: Rehabilitation  THERAPY DIAG:  No diagnosis found.  ONSET DATE: chronic, referred at last PCP visit 08/08/23  SUBJECTIVE:                                                                                                                                                                                           SUBJECTIVE STATEMENT: My r hip hurts on the side and back.  [facility], went 3 x and I dont know if I overdid it?  PERTINENT HISTORY:  Chronic h/o lower back pain and cervical spine pain.  Quite stiff.  PAIN:  Are you having pain? Yes c spine upper traps and central  lower back and across B post hips.,  worse with house work and with fast movements  PRECAUTIONS: None  RED FLAGS: None   WEIGHT BEARING RESTRICTIONS: No  FALLS:  Has patient fallen in last 6 months? No  LIVING ENVIRONMENT: Lives with: lives  with their family and lives alone Lives in: House/apartment Stairs: Yes: External: 1 steps; none Has following equipment at home: None  OCCUPATION: retired  PLOF: Independent  PATIENT GOALS: manage pain , get less stiff, able to tolerate prolonged walking,   NEXT MD VISIT: 4 weeks  OBJECTIVE:   DIAGNOSTIC FINDINGS:  na  PATIENT SURVEYS:  Modified Oswestry 21/50  NDI 14/50 28%  SCREENING FOR RED FLAGS: Bowel or bladder incontinence: No Spinal tumors: No Cauda equina syndrome: No Compression fracture: No Abdominal aneurysm: No  COGNITION: Overall cognitive status: Within functional limits for tasks assessed     SENSATION: WFL  MUSCLE LENGTH: Hamstrings: Right wfl deg; Left wfl deg POSTURE: note forward head posture, increased lumbar lordosis, B iliac crests symmetrical  PALPATION: B glut med, glut max, piriformis very tender , painful and post SI lig R upper traps, levator painful CERVICAL ROM:  rotation R 60, L 40, FB 80, ext 30, all with c/o of R upper traps pain particularly at end range LUMBAR ROM:   AROM eval  Flexion note R rotation 25%  Extension noted R rotation 20%  Right lateral flexion 50  Left lateral flexion 50  Right rotation   Left rotation    (Blank rows = not tested)  LOWER EXTREMITY ROM:   all wfl  LOWER EXTREMITY MMTall grossy wnl except B hip abd 4/5     LUMBAR SPECIAL TESTS:    FUNCTIONAL TESTS:  30 sec sit to stand TBD, deferred today due to vertigo  GAIT: Distance walked: 57' in clnic I  TODAY'S TREATMENT:                                                                                                                              DATE: 09/26/23:  Assessed R hip ROM and strength  in sitting:  Restricted and painful R lat hip and R post hip with flexion and IR at 50% R hip flexion MMT with some weakness noted Palpation with marked tenderness R vastus lateralis and R TFL, glut medius  Manual: Utilized TPDN: Trigger Point Dry-Needling  Treatment instructions: Expect mild to moderate muscle soreness. S/S of pneumothorax if dry needled over a lung field, and to seek immediate medical attention should they occur. Patient verbalized understanding of these instructions and education. Patient Consent Given: Yes Muscles treated: R TFL. R vastus lateralis mid thigh  Treatment response/outcome: Twitch Response Elicited and Palpable Increase in Muscle Length   Supine for mobilization with movement for R hip flexion, lateral glides with strap with assisted R hip flexion, 15 x, 2 bouts, emphasis on pain free Rom with patient.  AAROM R hip flexion improved post Rx    09/12/23: Manual techniques:  patient positioned in side lying for muscle energy technique to address lumbar spine mobility, more restricted L than R.   Prone for TPDN:Trigger Point Dry-Needling  Treatment instructions: Expect mild to moderate muscle soreness. S/S of pneumothorax if dry needled  over a lung field, and to seek immediate medical attention should they occur. Patient verbalized understanding of these instructions and education. Patient Consent Given: Yes Education handout provided: No Muscles treated:  B glut maximus, B glut medius, R upper traps Treatment response/outcome: Twitch Response Elicited and Palpable Increase in Muscle Length     PATIENT EDUCATION:  Education details: POC, goals Person educated: Patient Education method: Explanation, Demonstration, Tactile cues, and Verbal cues Education comprehension: verbalized understanding  HOME EXERCISE PROGRAM: TBD  ASSESSMENT:  CLINICAL IMPRESSION: Patient is a 77 y.o. female who participated today in skilled physical therapy due to chronic  lower back pan and stiffness and chronic R sided cervical spine pain.  She was restricted in her movements throughout the session again due to chronic vertigo. Her primary area of discomfort today was R post/ lat hip, perhaps due to increase in exercise intensity this week. She responded well to manual techniques.  Improved gait pattern, no longer antalgic when she left clinic at conclusion of session.  She should continue to benefit from skilled PT to address her pain/ deficits.  OBJECTIVE IMPAIRMENTS: decreased activity tolerance, decreased mobility, decreased ROM, dizziness, hypomobility, increased fascial restrictions, impaired flexibility, postural dysfunction, and pain.   ACTIVITY LIMITATIONS: lifting, bending, sitting, standing, squatting, sleeping, transfers, reach over head, and locomotion level  PARTICIPATION LIMITATIONS: cleaning, laundry, shopping, and community activity  PERSONAL FACTORS: Age, Behavior pattern, Fitness, Past/current experiences, Time since onset of injury/illness/exacerbation, and 1-2 comorbidities: HTN, afib  are also affecting patient's functional outcome.   REHAB POTENTIAL: Good  CLINICAL DECISION MAKING: Stable/uncomplicated  EVALUATION COMPLEXITY: Low   GOALS: Goals reviewed with patient? Yes  SHORT TERM GOALS: Target date: 2 weeks 09/26/23  I HEP For c spine and LS spine Baseline:TBD Goal status: INITIAL    LONG TERM GOALS: Target date: 12 weeks12/12/24  Full AROM c spine for r rotation and flexion Baseline: 60 % Goal status: INITIAL  2.  Improve modified oswestry from 21/50 to 32/50 Baseline:  Goal status: INITIAL  3.  Improve NDI from 14/50 to 9/50 Baseline:  Goal status: INITIAL  4.  Full ROM lumbar spine for FB to wnl Baseline: 40 % with R rotation noted in LS spine Goal status: INITIAL  PLAN:  PT FREQUENCY: 1-2x/week  PT DURATION: 12 weeks  PLANNED INTERVENTIONS: Therapeutic exercises, Therapeutic activity, Neuromuscular  re-education, Balance training, Gait training, Patient/Family education, Self Care, and Joint mobilization.  PLAN FOR NEXT SESSION: Manual techniques, TPDN, therex instruction, modalities as indicated   Tyneisha Hegeman L Eliav Mechling, PT, DPT, OCS 09/26/2023, 3:08 PM

## 2023-10-08 ENCOUNTER — Ambulatory Visit: Payer: 59

## 2023-10-08 ENCOUNTER — Other Ambulatory Visit: Payer: Self-pay

## 2023-10-08 ENCOUNTER — Other Ambulatory Visit (HOSPITAL_BASED_OUTPATIENT_CLINIC_OR_DEPARTMENT_OTHER): Payer: Self-pay | Admitting: Family Medicine

## 2023-10-08 DIAGNOSIS — M5441 Lumbago with sciatica, right side: Secondary | ICD-10-CM | POA: Diagnosis not present

## 2023-10-08 DIAGNOSIS — M2560 Stiffness of unspecified joint, not elsewhere classified: Secondary | ICD-10-CM | POA: Diagnosis not present

## 2023-10-08 DIAGNOSIS — G8929 Other chronic pain: Secondary | ICD-10-CM

## 2023-10-08 DIAGNOSIS — M542 Cervicalgia: Secondary | ICD-10-CM | POA: Diagnosis not present

## 2023-10-08 DIAGNOSIS — Z1239 Encounter for other screening for malignant neoplasm of breast: Secondary | ICD-10-CM

## 2023-10-08 DIAGNOSIS — R293 Abnormal posture: Secondary | ICD-10-CM

## 2023-10-08 NOTE — Therapy (Signed)
OUTPATIENT PHYSICAL THERAPY THORACOLUMBAR TREATMENT   Patient Name: [name] MRN: [id number] DOB:12/31/1945, 77 y.o., female Today's Date: 10/08/2023  END OF SESSION:  PT End of Session - 10/08/23 1628     Visit Number 3    Number of Visits 27    Date for PT Re-Evaluation 12/05/23    Authorization Type [employer]    Progress Note Due on Visit 10    PT Start Time 1534    PT Stop Time 1620    PT Time Calculation (min) 46 min    Activity Tolerance Patient limited by pain    Behavior During Therapy [facility] for tasks assessed/performed              Past Medical History:  Diagnosis Date   Acute MI, subendocardial (HCC)    Arthritis    Back pain 05/31/2013   Benign paroxysmal positional vertigo 08/05/2016   CAD (coronary artery disease)    a. NSTEMI 10/2011: LHC 11/19/11: pLAD 30%, oOM 60%, AVCFX 30%, CFX after OM2 30%, pRCA 60 and 70%, then 99%, AM 80-90% with TIMI 3 flow.  PCI: Promus DES x 2 to RCA; b. 06/2012 Cath: patent RCA stents w/ subtl occl of Acute Marginal (jailed)->Med rx; c. 05/2015 MV: EF 59%, mod mid infsept/inf/ap lat/ap infarct with peri-infarct isch-->Med Rx; d. 02/2016 Cath: LM nl, LAD 62m, RI 50, RCA patent stents.   Colitis    Facial skin lesion 01/28/2017   GERD (gastroesophageal reflux disease)    occasional   Hyperlipidemia    Hypertension    Hypertensive heart disease    a. Echocardiogram 11/19/11: Difficult acoustic windows, EF 60-65%, normal LV wall thickness, grade 1 diastolic dysfunction   Hypoparathyroidism (HCC)    Low zinc level 11/26/2016   Multinodular thyroid    Goiter s/p thyroidectomy in 2007 with post-op  hypocalcemia and post-op hypothyroidism/hypoparathyroidism with hypocalcemia   Neck pain on right side 07/13/2013   Nocturia 05/31/2013   Osteoarthritis    Pain in right axilla 03/13/2016   Palpitations    a. 03/2012 - patient set up for event monitor but did not wear correctly -  she declined wearing a repeat monitor   Post-surgical  hypothyroidism    Sun-damaged skin 12/05/2014   Tubular adenoma of colon 06/2011   Unspecified constipation 05/31/2013   Vertigo    Zinc deficiency 11/26/2015   Past Surgical History:  Procedure Laterality Date   CARDIAC CATHETERIZATION N/A 03/08/2016   Procedure: Left Heart Cath and Coronary Angiography;  Surgeon: Corky Crafts, MD;  Location: [facility] INVASIVE CV LAB;  Service: Cardiovascular;  Laterality: N/A;   COLONOSCOPY  Aenomatous polyps   07/05/2011   COLONOSCOPY N/A 09/28/2014   Procedure: COLONOSCOPY;  Surgeon: Meryl Dare, MD;  Location: [facility] ENDOSCOPY;  Service: Endoscopy;  Laterality: N/A;   CORONARY ANGIOPLASTY WITH STENT PLACEMENT     LEFT HEART CATH AND CORONARY ANGIOGRAPHY N/A 12/09/2018   Procedure: LEFT HEART CATH AND CORONARY ANGIOGRAPHY;  Surgeon: Corky Crafts, MD;  Location: [facility] INVASIVE CV LAB;  Service: Cardiovascular;  Laterality: N/A;   LEFT HEART CATHETERIZATION WITH CORONARY ANGIOGRAM N/A 07/17/2012   Procedure: LEFT HEART CATHETERIZATION WITH CORONARY ANGIOGRAM;  Surgeon: Herby Abraham, MD;  Location: [facility] CATH LAB;  Service: Cardiovascular;  Laterality: N/A;   PERCUTANEOUS CORONARY STENT INTERVENTION (PCI-S) N/A 11/19/2011   Procedure: PERCUTANEOUS CORONARY STENT INTERVENTION (PCI-S);  Surgeon: Herby Abraham, MD;  Location: [facility] CATH LAB;  Service: Cardiovascular;  Laterality: N/A;  POLYPECTOMY     SHOULDER ARTHROSCOPY W/ ROTATOR CUFF REPAIR     right   TOTAL THYROIDECTOMY  2007   GOITER   Patient Active Problem List   Diagnosis Date Noted   CRI (chronic renal insufficiency) 07/16/2023   Arthritis of left midfoot 06/11/2023   Sinus tarsi syndrome of left ankle 04/03/2023   Sinus tarsi syndrome of right ankle 02/18/2023   Edema 02/17/2023   Acute prerenal azotemia 02/06/2023   TIA (transient ischemic attack) 02/05/2023   Paresthesia 08/28/2022   Chronic neck pain 08/28/2022   Neuropathy 08/21/2022   Vitamin deficiency 10/23/2021    Hypoglycemia 10/07/2019   Heart disease 01/29/2019   Sensorineural hearing loss (SNHL) of both ears 01/27/2019   Primary osteoarthritis of both knees 12/19/2018   DDD (degenerative disc disease), lumbar 12/19/2018   ANA positive 10/27/2018   Anxiety 02/17/2018   Peripheral arterial disease (HCC) 02/20/2017   Skin lesion of face 01/28/2017   Benign paroxysmal positional vertigo 08/05/2016   Antiplatelet or antithrombotic long-term use 03/27/2016   Right lumbar radiculopathy 02/21/2016   Abnormality of gait 02/21/2016   Zinc deficiency 11/26/2015   Right hip pain 10/26/2015   Medicare annual wellness visit, subsequent 03/13/2015   Preventative health care 03/13/2015   Sun-damaged skin 12/05/2014   Angina of effort (HCC) 12/02/2014   Hx of colonic polyps 09/28/2014   Benign neoplasm of descending colon 09/28/2014   Other fatigue 09/10/2014   History of colonic polyps 07/16/2014   Bilateral thumb pain 03/20/2014   Postsurgical hypoparathyroidism (HCC) 08/03/2013   Neck pain on right side 07/13/2013   Constipation 05/31/2013   Back pain 05/31/2013   Nocturia 05/31/2013   GERD (gastroesophageal reflux disease) 11/02/2012   Tension headache 11/02/2012   Anemia 06/11/2012   Hypokalemia 06/11/2012   Depression 01/21/2012   Headache 12/23/2011   Sinus bradycardia 12/07/2011   Hypocalcemia 12/07/2011   Acute MI, subendocardial (HCC) 11/20/2011   Acquired hypothyroidism 05/20/2009   Essential hypertension, benign 05/20/2009   Hyperlipidemia 03/08/2009   CAD, NATIVE VESSEL 03/08/2009    PCP: Danise Edge, MD  REFERRING PROVIDER: same  REFERRING DIAG: chronic lower back pain and chronic neck pain  Rationale for Evaluation and Treatment: Rehabilitation  THERAPY DIAG:  Chronic right-sided low back pain with right-sided sciatica  Abnormal posture  Joint stiffness of spine  ONSET DATE: chronic, referred at last PCP visit 08/08/23  SUBJECTIVE:  SUBJECTIVE STATEMENT: My R hip hurts on the side, felt good after I was here 2 weeks ago for 3 -4 days.  I dont know why it hurts so much,  I am having trouble sleeping with it, I slept in recliner last night PERTINENT HISTORY:  Chronic h/o lower back pain and cervical spine pain.  Quite stiff.  PAIN:  Are you having pain? Yes c spine upper traps and central lower back and across B post hips.,  worse with house work and with fast movements  PRECAUTIONS: None  RED FLAGS: None   WEIGHT BEARING RESTRICTIONS: No  FALLS:  Has patient fallen in last 6 months? No  LIVING ENVIRONMENT: Lives with: lives with their family and lives alone Lives in: House/apartment Stairs: Yes: External: 1 steps; none Has following equipment at home: None  OCCUPATION: retired  PLOF: Independent  PATIENT GOALS: manage pain , get less stiff, able to tolerate prolonged walking,   NEXT MD VISIT: 4 weeks  OBJECTIVE:   DIAGNOSTIC FINDINGS:  na  PATIENT SURVEYS:  Modified Oswestry 21/50  NDI 14/50 28%  SCREENING FOR RED FLAGS: Bowel or bladder incontinence: No Spinal tumors: No Cauda equina syndrome: No Compression fracture: No Abdominal aneurysm: No  COGNITION: Overall cognitive status: Within functional limits for tasks assessed     SENSATION: WFL  MUSCLE LENGTH: Hamstrings: Right wfl deg; Left wfl deg POSTURE: note forward head posture, increased lumbar lordosis, B iliac crests symmetrical  PALPATION: B glut med, glut max, piriformis very tender , painful and post SI lig R upper traps, levator painful CERVICAL ROM:  rotation R 60, L 40, FB 80, ext 30, all with c/o of R upper traps pain particularly at end range LUMBAR ROM:   AROM eval  Flexion note R rotation 25%  Extension noted R rotation 20%  Right lateral flexion 50  Left  lateral flexion 50  Right rotation   Left rotation    (Blank rows = not tested)  LOWER EXTREMITY ROM:   all wfl  LOWER EXTREMITY MMTall grossy wnl except B hip abd 4/5     LUMBAR SPECIAL TESTS:    FUNCTIONAL TESTS:  30 sec sit to stand TBD, deferred today due to vertigo  GAIT: Distance walked: 81' in clnic I  TODAY'S TREATMENT:                                                                                                                              DATE: 10/08/23:  Reassessed R hip flexibility: restricted with R hip flexion to 95, IR to 5, ER 15 all movements reproduce pain R lat hip. Manual: mobilization with movement for R hip flex and IR with lat distraction of R hip jt with strap, 4 bouts, 15 reps each  L side lying for gentle cross friction massage R lateral hip musculature, very tender, hypersensitive over glut med and particularly TFL.    Ultrasound 1.4 w/cm2 x 7 min over R  lat hip musculature for 7 min for deep heat for pain management  Therex: supine for bridging, 12 reps Manually resisted isometric hook lying hip IR/ER, 5 sec holds, 15 reps  09/26/23:  Assessed R hip ROM and strength in sitting:  Restricted and painful R lat hip and R post hip with flexion and IR at 50% R hip flexion MMT with some weakness noted Palpation with marked tenderness R vastus lateralis and R TFL, glut medius  Manual: Utilized TPDN: Trigger Point Dry-Needling  Treatment instructions: Expect mild to moderate muscle soreness. S/S of pneumothorax if dry needled over a lung field, and to seek immediate medical attention should they occur. Patient verbalized understanding of these instructions and education. Patient Consent Given: Yes Muscles treated: R TFL. R vastus lateralis mid thigh  Treatment response/outcome: Twitch Response Elicited and Palpable Increase in Muscle Length   Supine for mobilization with movement for R hip flexion, lateral glides with strap with assisted R hip  flexion, 15 x, 2 bouts, emphasis on pain free Rom with patient.  AAROM R hip flexion improved post Rx    09/12/23: Manual techniques:  patient positioned in side lying for muscle energy technique to address lumbar spine mobility, more restricted L than R.   Prone for TPDN:Trigger Point Dry-Needling  Treatment instructions: Expect mild to moderate muscle soreness. S/S of pneumothorax if dry needled over a lung field, and to seek immediate medical attention should they occur. Patient verbalized understanding of these instructions and education. Patient Consent Given: Yes Education handout provided: No Muscles treated:  B glut maximus, B glut medius, R upper traps Treatment response/outcome: Twitch Response Elicited and Palpable Increase in Muscle Length     PATIENT EDUCATION:  Education details: POC, goals Person educated: Patient Education method: Explanation, Demonstration, Tactile cues, and Verbal cues Education comprehension: verbalized understanding  HOME EXERCISE PROGRAM: TBD  ASSESSMENT:  CLINICAL IMPRESSION: Patient is a 77 y.o. female who participated today in skilled physical therapy due to chronic lower back pan and stiffness and chronic R sided cervical spine pain.   primary area of discomfort today again was R post/ lat hip. She responded fair to manual techniques.  Improved gait pattern overall.  Hypersensitive over R lateral hip musculature with palpation . She deferred dry needling today.   She should continue to benefit from skilled PT to address her pain/ deficits. May need x ray R hip if continues to linger with pain. OBJECTIVE IMPAIRMENTS: decreased activity tolerance, decreased mobility, decreased ROM, dizziness, hypomobility, increased fascial restrictions, impaired flexibility, postural dysfunction, and pain.   ACTIVITY LIMITATIONS: lifting, bending, sitting, standing, squatting, sleeping, transfers, reach over head, and locomotion level  PARTICIPATION LIMITATIONS:  cleaning, laundry, shopping, and community activity  PERSONAL FACTORS: Age, Behavior pattern, Fitness, Past/current experiences, Time since onset of injury/illness/exacerbation, and 1-2 comorbidities: HTN, afib  are also affecting patient's functional outcome.   REHAB POTENTIAL: Good  CLINICAL DECISION MAKING: Stable/uncomplicated  EVALUATION COMPLEXITY: Low   GOALS: Goals reviewed with patient? Yes  SHORT TERM GOALS: Target date: 2 weeks 09/26/23  I HEP For c spine and LS spine Baseline:TBD Goal status: INITIAL    LONG TERM GOALS: Target date: 12 weeks12/12/24  Full AROM c spine for r rotation and flexion Baseline: 60 % Goal status: INITIAL  2.  Improve modified oswestry from 21/50 to 32/50 Baseline:  Goal status: INITIAL  3.  Improve NDI from 14/50 to 9/50 Baseline:  Goal status: INITIAL  4.  Full ROM lumbar spine for FB to  wnl Baseline: 40 % with R rotation noted in LS spine Goal status: INITIAL  PLAN:  PT FREQUENCY: 1-2x/week  PT DURATION: 12 weeks  PLANNED INTERVENTIONS: Therapeutic exercises, Therapeutic activity, Neuromuscular re-education, Balance training, Gait training, Patient/Family education, Self Care, and Joint mobilization.  PLAN FOR NEXT SESSION: Manual techniques, TPDN, therex instruction, modalities as indicated   Scotlyn Mccranie L Tyrese Capriotti, PT, DPT, OCS 10/08/2023, 4:40 PM

## 2023-10-15 ENCOUNTER — Other Ambulatory Visit: Payer: Self-pay

## 2023-10-15 ENCOUNTER — Ambulatory Visit: Payer: 59

## 2023-10-15 ENCOUNTER — Ambulatory Visit: Payer: 59 | Attending: Internal Medicine | Admitting: Internal Medicine

## 2023-10-15 ENCOUNTER — Encounter: Payer: Self-pay | Admitting: Internal Medicine

## 2023-10-15 ENCOUNTER — Encounter: Payer: Self-pay | Admitting: *Deleted

## 2023-10-15 VITALS — BP 148/64 | HR 60 | Ht 65.0 in | Wt 196.0 lb

## 2023-10-15 DIAGNOSIS — R42 Dizziness and giddiness: Secondary | ICD-10-CM

## 2023-10-15 DIAGNOSIS — R002 Palpitations: Secondary | ICD-10-CM | POA: Diagnosis not present

## 2023-10-15 DIAGNOSIS — I2510 Atherosclerotic heart disease of native coronary artery without angina pectoris: Secondary | ICD-10-CM | POA: Diagnosis not present

## 2023-10-15 DIAGNOSIS — E785 Hyperlipidemia, unspecified: Secondary | ICD-10-CM | POA: Diagnosis not present

## 2023-10-15 DIAGNOSIS — Z79899 Other long term (current) drug therapy: Secondary | ICD-10-CM

## 2023-10-15 DIAGNOSIS — E039 Hypothyroidism, unspecified: Secondary | ICD-10-CM | POA: Diagnosis not present

## 2023-10-15 DIAGNOSIS — I10 Essential (primary) hypertension: Secondary | ICD-10-CM

## 2023-10-15 NOTE — Progress Notes (Signed)
Patient enrolled for [employer]/ [employer] to ship a 30 day cardiac event monitor to her address on file. 

## 2023-10-15 NOTE — Patient Instructions (Addendum)
Medication Instructions:   *If you need a refill on your cardiac medications before your next appointment, please call your pharmacy*   Lab Work: Cmet, cbc, tsh, nmr, apo b, hgba1c, vit D  today   If you have labs (blood work) drawn today and your tests are completely normal, you will receive your results only by: MyChart Message (if you have MyChart) OR A paper copy in the mail If you have any lab test that is abnormal or we need to change your treatment, we will call you to review the results.   Testing/Procedures: [employer] Cardiac Event Monitor Instructions  Your physician has requested you wear your cardiac event monitor for _____ days, (1-30). [employer] may call or text to confirm a shipping address. The monitor will be sent to a land address via UPS. [employer] will not ship a monitor to a PO BOX. It typically takes 3-5 days to receive your monitor after it has been enrolled. [employer] will assist with USPS tracking if your package is delayed. The telephone number for [employer] is [phone]. Once you have received your monitor, please review the enclosed instructions. Instruction tutorials can also be viewed under help and settings on the enclosed cell phone. Your monitor has already been registered assigning a specific monitor serial # to you.  Billing and Self Pay Discount Information  [employer] has been provided the insurance information we had on file for you.  If your insurance has been updated, please call [employer] at [phone] to provide them with your updated insurance information.   [employer] offers a discounted Self Pay option for patients who have insurance that does not cover their cardiac event monitor or patients without insurance.  The discounted cost of a Self Pay Cardiac Event Monitor would be $225.00 , if the patient contacts [employer] at [phone] within 7 days of applying the monitor to make payment arrangements.  If the patient does not  contact [employer] within 7 days of applying the monitor, the cost of the cardiac event monitor will be $350.00.  Applying the monitor  Remove cell phone from case and turn it on. The cell phone works as IT consultant and needs to be within [employer] of you at all times. The cell phone will need to be charged on a daily basis. We recommend you plug the cell phone into the enclosed charger at your bedside table every night.  Monitor batteries: You will receive two monitor batteries labelled #1 and #2. These are your recorders. Plug battery #2 onto the second connection on the enclosed charger. Keep one battery on the charger at all times. This will keep the monitor battery deactivated. It will also keep it fully charged for when you need to switch your monitor batteries. A small light will be blinking on the battery emblem when it is charging. The light on the battery emblem will remain on when the battery is fully charged.  Open package of a Monitor strip. Insert battery #1 into black hood on strip and gently squeeze monitor battery onto connection as indicated in instruction booklet. Set aside while preparing skin.  Choose location for your strip, vertical or horizontal, as indicated in the instruction booklet. Shave to remove all hair from location. There cannot be any lotions, oils, powders, or colognes on skin where monitor is to be applied. Wipe skin clean with enclosed Saline wipe. Dry skin completely.  Peel paper labeled #1 off the back of the Monitor strip exposing the adhesive. Place the monitor on the  chest in the vertical or horizontal position shown in the instruction booklet. One arrow on the monitor strip must be pointing upward. Carefully remove paper labeled #2, attaching remainder of strip to your skin. Try not to create any folds or wrinkles in the strip as you apply it.  Firmly press and release the circle in the center of the monitor battery. You will hear a small beep.  This is turning the monitor battery on. The heart emblem on the monitor battery will light up every 5 seconds if the monitor battery in turned on and connected to the patient securely. Do not push and hold the circle down as this turns the monitor battery off. The cell phone will locate the monitor battery. A screen will appear on the cell phone checking the connection of your monitor strip. This may read poor connection initially but change to good connection within the next minute. Once your monitor accepts the connection you will hear a series of 3 beeps followed by a climbing crescendo of beeps. A screen will appear on the cell phone showing the two monitor strip placement options. Touch the picture that demonstrates where you applied the monitor strip.  Your monitor strip and battery are waterproof. You are able to shower, bathe, or swim with the monitor on. They just ask you do not submerge deeper than 3 feet underwater. We recommend removing the monitor if you are swimming in a lake, river, or ocean.  Your monitor battery will need to be switched to a fully charged monitor battery approximately once a week. The cell phone will alert you of an action which needs to be made.  On the cell phone, tap for details to reveal connection status, monitor battery status, and cell phone battery status. The green dots indicates your monitor is in good status. A red dot indicates there is something that needs your attention.  To record a symptom, click the circle on the monitor battery. In 30-60 seconds a list of symptoms will appear on the cell phone. Select your symptom and tap save. Your monitor will record a sustained or significant arrhythmia regardless of you clicking the button. Some patients do not feel the heart rhythm irregularities. [employer] will notify us of any serious or critical events.  Refer to instruction booklet for instructions on switching batteries, changing strips, the Do  not disturb or Pause features, or any additional questions.  Call [employer] at [phone], to confirm your monitor is transmitting and record your baseline. They will answer any questions you may have regarding the monitor instructions at that time.  Returning the monitor to [employer]  Place all equipment back into blue box. Peel off strip of paper to expose adhesive and close box securely. There is a prepaid UPS shipping label on this box. Drop in a UPS drop box, or at a UPS facility like [employer]. You may also contact [employer] to arrange UPS to pick up monitor package at your home.    Follow-Up: At [facility], you and your health needs are our priority.  As part of our continuing mission to provide you with exceptional heart care, we have created designated Provider Care Teams.  These Care Teams include your primary Cardiologist (physician) and Advanced Practice Providers (APPs -  Physician Assistants and Nurse Practitioners) who all work together to provide you with the care you need, when you need it.  We recommend signing up for the patient portal called "MyChart".  Sign up information is provided on  this After Visit Summary.  MyChart is used to connect with patients for Virtual Visits (Telemedicine).  Patients are able to view lab/test results, encounter notes, upcoming appointments, etc.  Non-urgent messages can be sent to your provider as well.   To learn more about what you can do with MyChart, go to [email or web address].    Your next appointment:  JANUARY/ FEBRUARY WITH DR [name]

## 2023-10-15 NOTE — Progress Notes (Signed)
Cardiology Office Note   Date:  10/15/2023   ID:  [name], DOB 08-21-1946, MRN [id number]  PCP:  Bradd Canary, MD  Cardiologist:   Dietrich Pates, MD   F/U of CAD    History of Present Illness:  [name] is a 77 y.o. female with CAD - s/p NSTEMI 10/2011 with DESx2  Also has a hx of  GERD, HTN, HLD, vertigo.  L heart cath in Dec 2019 showed:  LAD with 25%  OM  with 100% with Left to R  collaterals  RCA with patent stent   Normal LVEF    Echo in early Sept 2020   LVEF and RVEF were normal   Mild diastolic dysfunction      In 2023 the pt had some numbness on L face   Seen by  neuro, ultimately felt not to represent a TIA  Echo done   Normal   Carotid USN without severe dz   Symptoms felt to be due to DJD of neck     April 2024  Seen in ER for CP/shoulder pain  BP severely elevated   Symptoms eased with NTG    I saw the pt in JUne 2024 Since seen the pt says she is fatigued , weak    No CP  BP at home was 105/45  I aw the pt in AUgust 2024  and I recomm that she back down on amlodipine to 5 mg daily     The patient says she about 2 wks ago she started having spells where she feels "light"    Not dizzy   Feels like she has to catch her breath.   Episodes last about 1 minute Denies CP   One episode occurred while driving   One woke her from sleep   Feels OK after the spells  Denies sinus problems   No congestion.  No fevers    She says her BP has been higher at home since changing amlodipine dose   Never felt different   Current Meds  Medication Sig   amLODipine (NORVASC) 5 MG tablet Take 1 tablet (5 mg total) by mouth daily.   aspirin EC 81 MG tablet Take 81 mg by mouth daily.   B Complex-C (B-COMPLEX WITH VITAMIN C) tablet Take 1 tablet by mouth daily.   bisoprolol (ZEBETA) 5 MG tablet TAKE 1 TABLET BY MOUTH DAILY   calcitRIOL (ROCALTROL) 0.25 MCG capsule TAKE 3 CAPSULES BY MOUTH DAILY   Cholecalciferol (VITAMIN D) 125 MCG (5000 UT) CAPS Take 5,000 Units by mouth daily  in the afternoon.   clopidogrel (PLAVIX) 75 MG tablet Take 1 tablet (75 mg total) by mouth daily.   furosemide (LASIX) 20 MG tablet TAKE 1 TABLET BY MOUTH ONCE  WEEKLY AS NEEDED   gabapentin (NEURONTIN) 100 MG capsule Take 2 capsules (200 mg total) by mouth at bedtime.   isosorbide mononitrate (IMDUR) 60 MG 24 hr tablet TAKE 1 TABLET BY MOUTH DAILY   levothyroxine (SYNTHROID) 112 MCG tablet Take 1 tablet (112 mcg total) by mouth 3 (three) times a week.   Multiple Vitamin (MULTIVITAMIN WITH MINERALS) TABS tablet Take 1 tablet by mouth daily.   nitroGLYCERIN (NITROSTAT) 0.4 MG SL tablet DISSOLVE 1 TABLET UNDER THE  TONGUE EVERY 5 MINUTES AS NEEDED FOR CHEST PAIN. MAX OF 3 TABLETS IN 15 MINUTES. CALL 911 IF PAIN  PERSISTS.   pantoprazole (PROTONIX) 40 MG tablet TAKE 1 TABLET BY MOUTH DAILY  REPATHA SURECLICK 140 MG/ML SOAJ INJECT 140MG  SUBCUTANEOUSLY  EVERY 2 WEEKS   rosuvastatin (CRESTOR) 40 MG tablet Take 1 tablet (40 mg total) by mouth daily.     Allergies:   Zetia [ezetimibe] and Penicillin g   Past Medical History:  Diagnosis Date   Acute MI, subendocardial (HCC)    Arthritis    Back pain 05/31/2013   Benign paroxysmal positional vertigo 08/05/2016   CAD (coronary artery disease)    a. NSTEMI 10/2011: LHC 11/19/11: pLAD 30%, oOM 60%, AVCFX 30%, CFX after OM2 30%, pRCA 60 and 70%, then 99%, AM 80-90% with TIMI 3 flow.  PCI: Promus DES x 2 to RCA; b. 06/2012 Cath: patent RCA stents w/ subtl occl of Acute Marginal (jailed)->Med rx; c. 05/2015 MV: EF 59%, mod mid infsept/inf/ap lat/ap infarct with peri-infarct isch-->Med Rx; d. 02/2016 Cath: LM nl, LAD 70m, RI 50, RCA patent stents.   Colitis    Facial skin lesion 01/28/2017   GERD (gastroesophageal reflux disease)    occasional   Hyperlipidemia    Hypertension    Hypertensive heart disease    a. Echocardiogram 11/19/11: Difficult acoustic windows, EF 60-65%, normal LV wall thickness, grade 1 diastolic dysfunction   Hypoparathyroidism (HCC)     Low zinc level 11/26/2016   Multinodular thyroid    Goiter s/p thyroidectomy in 2007 with post-op  hypocalcemia and post-op hypothyroidism/hypoparathyroidism with hypocalcemia   Neck pain on right side 07/13/2013   Nocturia 05/31/2013   Osteoarthritis    Pain in right axilla 03/13/2016   Palpitations    a. 03/2012 - patient set up for event monitor but did not wear correctly -  she declined wearing a repeat monitor   Post-surgical hypothyroidism    Sun-damaged skin 12/05/2014   Tubular adenoma of colon 06/2011   Unspecified constipation 05/31/2013   Vertigo    Zinc deficiency 11/26/2015    Past Surgical History:  Procedure Laterality Date   CARDIAC CATHETERIZATION N/A 03/08/2016   Procedure: Left Heart Cath and Coronary Angiography;  Surgeon: Corky Crafts, MD;  Location: [facility] INVASIVE CV LAB;  Service: Cardiovascular;  Laterality: N/A;   COLONOSCOPY  Aenomatous polyps   07/05/2011   COLONOSCOPY N/A 09/28/2014   Procedure: COLONOSCOPY;  Surgeon: Meryl Dare, MD;  Location: [facility] ENDOSCOPY;  Service: Endoscopy;  Laterality: N/A;   CORONARY ANGIOPLASTY WITH STENT PLACEMENT     LEFT HEART CATH AND CORONARY ANGIOGRAPHY N/A 12/09/2018   Procedure: LEFT HEART CATH AND CORONARY ANGIOGRAPHY;  Surgeon: Corky Crafts, MD;  Location: [facility] INVASIVE CV LAB;  Service: Cardiovascular;  Laterality: N/A;   LEFT HEART CATHETERIZATION WITH CORONARY ANGIOGRAM N/A 07/17/2012   Procedure: LEFT HEART CATHETERIZATION WITH CORONARY ANGIOGRAM;  Surgeon: Herby Abraham, MD;  Location: [facility] CATH LAB;  Service: Cardiovascular;  Laterality: N/A;   PERCUTANEOUS CORONARY STENT INTERVENTION (PCI-S) N/A 11/19/2011   Procedure: PERCUTANEOUS CORONARY STENT INTERVENTION (PCI-S);  Surgeon: Herby Abraham, MD;  Location: [facility] CATH LAB;  Service: Cardiovascular;  Laterality: N/A;   POLYPECTOMY     SHOULDER ARTHROSCOPY W/ ROTATOR CUFF REPAIR     right   TOTAL THYROIDECTOMY  2007   GOITER     Social History:  The  patient  reports that she has never smoked. She has never used smokeless tobacco. She reports that she does not currently use alcohol. She reports that she does not use drugs.   Family History:  The patient's family history includes Blindness in her sister; Cancer in her brother  and brother; Colon cancer in her brother; Congestive Heart Failure in her sister; Healthy in her daughter, daughter, daughter, and son; Heart attack in her father; Heart disease in her father; Hypertension in her father and sister; Thyroid disease in her sister.    ROS:  Please see the history of present illness. All other systems are reviewed and  Negative to the above problem except as noted.    PHYSICAL EXAM: VS:  BP (!) 148/64   Pulse 60   Ht 5\' 5"  (1.651 m)   Wt 196 lb (88.9 kg)   SpO2 95%   BMI 32.62 kg/m   GEN: Well nourished, well developed, in no acute distress  HEENT: normal  Neck: JVP is nornal  Cardiac: RRR; no murmurs  No LE  edema  Respiratory:  clear to auscultation  GI: soft, nontender,No hepatomegaly   EKG:  EKG is not  done    Echo   Sept 2023   1. Left ventricular ejection fraction, by estimation, is 60 to 65%. The  left ventricle has normal function. The left ventricle has no regional  wall motion abnormalities. There is mild left ventricular hypertrophy.  Left ventricular diastolic parameters  were normal.   2. Right ventricular systolic function is normal. The right ventricular  size is normal.   3. The mitral valve is abnormal. Trivial mitral valve regurgitation. No  evidence of mitral stenosis.   4. The aortic valve is tricuspid. Aortic valve regurgitation is trivial.  No aortic stenosis is present.   5. The inferior vena cava is normal in size with greater than 50%  respiratory variability, suggesting right atrial pressure of 3 mmHg.    Lipid Panel    Component Value Date/Time   CHOL 200 02/05/2023 2259   CHOL 172 10/11/2022 1050   TRIG 178 (H) 02/05/2023 2259   HDL 52  02/05/2023 2259   HDL 43 10/11/2022 1050   CHOLHDL 3.8 02/05/2023 2259   VLDL 36 02/05/2023 2259   LDLCALC 112 (H) 02/05/2023 2259   LDLCALC 101 (H) 10/11/2022 1050   LDLDIRECT 96.0 11/06/2022 1623      Wt Readings from Last 3 Encounters:  10/15/23 196 lb (88.9 kg)  08/15/23 194 lb (88 kg)  08/12/23 193 lb 3.2 oz (87.6 kg)      ASSESSMENT AND PLAN:  1 Spells   Pt with episodes that are brief  A "light" sensation, with mild SOB   Not dizzy   Very unusual  BP is higher today   I do not think due to low BP  ? If she is having ectopy or brief pauses    Would set up for an event monitor  Check thyroid    2  HTN  BP is a little high  Would increase amlodipine to 7.5   If tolerates and BP still high then back to 10 mg daily      3  CAD    She has an occluded OM that fills via collaterals  [facility] Rx     4  HL  Last lipids LDL 120 in July 2024   Will recheck today     Check CMET and Vit D as well     Follow up this winter       Current medicines are reviewed at length with the patient today.  The patient does not have concerns regarding medicines.  Signed, Dietrich Pates, MD  10/15/2023 8:42 AM    [facility]  [address] [place], [place]  [address] Phone: [phone]; Fax: [phone]

## 2023-10-16 ENCOUNTER — Encounter (HOSPITAL_BASED_OUTPATIENT_CLINIC_OR_DEPARTMENT_OTHER): Payer: Self-pay

## 2023-10-16 ENCOUNTER — Other Ambulatory Visit: Payer: Self-pay

## 2023-10-16 ENCOUNTER — Emergency Department (HOSPITAL_BASED_OUTPATIENT_CLINIC_OR_DEPARTMENT_OTHER): Payer: 59

## 2023-10-16 ENCOUNTER — Emergency Department (HOSPITAL_BASED_OUTPATIENT_CLINIC_OR_DEPARTMENT_OTHER)
Admission: EM | Admit: 2023-10-16 | Discharge: 2023-10-17 | Disposition: A | Discharge status: Home / Self Care | Payer: 59 | Attending: Emergency Medicine | Admitting: Emergency Medicine

## 2023-10-16 DIAGNOSIS — I10 Essential (primary) hypertension: Secondary | ICD-10-CM | POA: Diagnosis not present

## 2023-10-16 DIAGNOSIS — R072 Precordial pain: Secondary | ICD-10-CM | POA: Diagnosis not present

## 2023-10-16 DIAGNOSIS — I2510 Atherosclerotic heart disease of native coronary artery without angina pectoris: Secondary | ICD-10-CM | POA: Diagnosis not present

## 2023-10-16 DIAGNOSIS — R0789 Other chest pain: Secondary | ICD-10-CM | POA: Diagnosis present

## 2023-10-16 DIAGNOSIS — Z79899 Other long term (current) drug therapy: Secondary | ICD-10-CM | POA: Insufficient documentation

## 2023-10-16 DIAGNOSIS — Z7902 Long term (current) use of antithrombotics/antiplatelets: Secondary | ICD-10-CM | POA: Diagnosis not present

## 2023-10-16 DIAGNOSIS — R079 Chest pain, unspecified: Secondary | ICD-10-CM | POA: Diagnosis not present

## 2023-10-16 LAB — BASIC METABOLIC PANEL
Anion gap: 14 (ref 5–15)
BUN: 17 mg/dL (ref 8–23)
CO2: 28 mmol/L (ref 22–32)
Calcium: 8.9 mg/dL (ref 8.9–10.3)
Chloride: 99 mmol/L (ref 98–111)
Creatinine, Ser: 1.04 mg/dL — ABNORMAL HIGH (ref 0.44–1.00)
GFR, Estimated: 56 mL/min — ABNORMAL LOW (ref >60)
Glucose, Bld: 101 mg/dL — ABNORMAL HIGH (ref 70–99)
Potassium: 3.9 mmol/L (ref 3.5–5.1)
Sodium: 141 mmol/L (ref 135–145)

## 2023-10-16 LAB — COMPREHENSIVE METABOLIC PANEL
ALT: 11 [IU]/L (ref 0–32)
AST: 15 [IU]/L (ref 0–40)
Albumin: 4.2 g/dL (ref 3.8–4.8)
Alkaline Phosphatase: 105 [IU]/L (ref 44–121)
BUN/Creatinine Ratio: 17 (ref 12–28)
BUN: 20 mg/dL (ref 8–27)
Bilirubin Total: 0.3 mg/dL (ref 0.0–1.2)
CO2: 27 mmol/L (ref 20–29)
Calcium: 9.1 mg/dL (ref 8.7–10.3)
Chloride: 103 mmol/L (ref 96–106)
Creatinine, Ser: 1.16 mg/dL — ABNORMAL HIGH (ref 0.57–1.00)
Globulin, Total: 2.9 g/dL (ref 1.5–4.5)
Glucose: 86 mg/dL (ref 70–99)
Potassium: 4.8 mmol/L (ref 3.5–5.2)
Sodium: 144 mmol/L (ref 134–144)
Total Protein: 7.1 g/dL (ref 6.0–8.5)
eGFR: 49 mL/min/{1.73_m2} — ABNORMAL LOW (ref >59)

## 2023-10-16 LAB — NMR, LIPOPROFILE
Cholesterol, Total: 189 mg/dL (ref 100–199)
HDL Particle Number: 34.7 umol/L (ref >=30.5)
HDL-C: 49 mg/dL (ref >39)
LDL Particle Number: 1363 nmol/L — ABNORMAL HIGH (ref <1000)
LDL Size: 21 nm (ref >20.5)
LDL-C (NIH Calc): 113 mg/dL — ABNORMAL HIGH (ref 0–99)
LP-IR Score: 61 — ABNORMAL HIGH (ref <=45)
Small LDL Particle Number: 709 nmol/L — ABNORMAL HIGH (ref <=527)
Triglycerides: 156 mg/dL — ABNORMAL HIGH (ref 0–149)

## 2023-10-16 LAB — CBC
HCT: 42.6 % (ref 36.0–46.0)
Hematocrit: 43.9 % (ref 34.0–46.6)
Hemoglobin: 14 g/dL (ref 11.1–15.9)
Hemoglobin: 14.1 g/dL (ref 12.0–15.0)
MCH: 28.5 pg (ref 26.6–33.0)
MCH: 28.8 pg (ref 26.0–34.0)
MCHC: 31.9 g/dL (ref 31.5–35.7)
MCHC: 33.1 g/dL (ref 30.0–36.0)
MCV: 89 fL (ref 79–97)
MCV: 86.9 fL (ref 80.0–100.0)
Platelets: 282 10*3/uL (ref 150–450)
Platelets: 288 10*3/uL (ref 150–400)
RBC: 4.92 x10E6/uL (ref 3.77–5.28)
RBC: 4.9 MIL/uL (ref 3.87–5.11)
RDW: 13.2 % (ref 11.7–15.4)
RDW: 13.8 % (ref 11.5–15.5)
WBC: 7.8 10*3/uL (ref 3.4–10.8)
WBC: 9.6 10*3/uL (ref 4.0–10.5)
nRBC: 0 % (ref 0.0–0.2)

## 2023-10-16 LAB — TSH: TSH: 3.12 u[IU]/mL (ref 0.450–4.500)

## 2023-10-16 LAB — APOLIPOPROTEIN B: Apolipoprotein B: 93 mg/dL — ABNORMAL HIGH (ref <90)

## 2023-10-16 LAB — HEMOGLOBIN A1C
Est. average glucose Bld gHb Est-mCnc: 111 mg/dL
Hgb A1c MFr Bld: 5.5 % (ref 4.8–5.6)

## 2023-10-16 LAB — VITAMIN D 25 HYDROXY (VIT D DEFICIENCY, FRACTURES): Vit D, 25-Hydroxy: 51.1 ng/mL (ref 30.0–100.0)

## 2023-10-16 MED ORDER — ASPIRIN 81 MG PO CHEW
162.0000 mg | CHEWABLE_TABLET | Freq: Once | ORAL | Status: AC
  Administered 2023-10-16: 162 mg via ORAL
  Filled 2023-10-16: qty 2

## 2023-10-16 NOTE — ED Provider Notes (Signed)
[place] [facility] AT [facility] Provider Note   CSN: [id number] Arrival date & time: 10/16/23  2314     History {Add pertinent medical, surgical, social history, OB history to HPI:1} Chief Complaint  Patient presents with   Chest Pain   Hypertension    [name] is a 77 y.o. female.  The history is provided by the patient and medical records.  Chest Pain Hypertension Associated symptoms include chest pain.  [name] is a 77 y.o. female who presents to the Emergency Department complaining of chest pain. Pressure and heavy.  Had a brief stabbing pain that went away.  Started about one hour. Wax  and wane, now  constant.  Felt heaviness when she went to bed.  High bp, 20 minutes after started pain.  Took ntg x2 and 2 baby asa.  Pain better after nitro No diaphoresis, sob, n/v BP 180 at home. Bp 165 this morning. Checked throughout the day.  HTN     Home Medications Prior to Admission medications   Medication Sig Start Date End Date Taking? Authorizing Provider  amLODipine (NORVASC) 5 MG tablet Take 1 tablet (5 mg total) by mouth daily. 08/12/23   Pricilla Riffle, MD  aspirin EC 81 MG tablet Take 81 mg by mouth daily. 11/22/11   Dunn, Dayna N, PA-C  B Complex-C (B-COMPLEX WITH VITAMIN C) tablet Take 1 tablet by mouth daily.    [provider]  bisoprolol (ZEBETA) 5 MG tablet TAKE 1 TABLET BY MOUTH DAILY 08/20/23   Pricilla Riffle, MD  calcitRIOL (ROCALTROL) 0.25 MCG capsule TAKE 3 CAPSULES BY MOUTH DAILY 08/20/23   Pricilla Riffle, MD  Cholecalciferol (VITAMIN D) 125 MCG (5000 UT) CAPS Take 5,000 Units by mouth daily in the afternoon. 01/17/23   Pricilla Riffle, MD  ciclopirox [facility]) 8 % solution Apply topically at bedtime. Apply over nail and surrounding skin. Apply daily over previous coat. After seven (7) days, may remove with alcohol and continue cycle. Patient not taking: Reported on 10/15/2023 06/12/23   Hyman Hopes B, NP   clopidogrel (PLAVIX) 75 MG tablet Take 1 tablet (75 mg total) by mouth daily. 07/11/23   Pricilla Riffle, MD  diazepam (VALIUM) 5 MG tablet Take 1 tablet (5 mg total) by mouth daily as needed for anxiety (MRI). Take 1 one hour prior to MRI and another when arrive Patient not taking: Reported on 10/15/2023 08/21/22   Bradd Canary, MD  furosemide (LASIX) 20 MG tablet TAKE 1 TABLET BY MOUTH ONCE  WEEKLY AS NEEDED 06/21/23   Pricilla Riffle, MD  gabapentin (NEURONTIN) 100 MG capsule Take 2 capsules (200 mg total) by mouth at bedtime. 02/18/23   Judi Saa, DO  hydrALAZINE (APRESOLINE) 10 MG tablet TAKE 1/2 TABLET (5 MG) BY MOUTH AS NEEDED FOR BP ABOVE 160 OR GREATER Patient not taking: Reported on 10/15/2023 04/02/23   Pricilla Riffle, MD  isosorbide mononitrate (IMDUR) 60 MG 24 hr tablet TAKE 1 TABLET BY MOUTH DAILY 07/11/23   Pricilla Riffle, MD  levothyroxine (SYNTHROID) 112 MCG tablet Take 1 tablet (112 mcg total) by mouth 3 (three) times a week. 09/20/23   Pricilla Riffle, MD  Multiple Vitamin (MULTIVITAMIN WITH MINERALS) TABS tablet Take 1 tablet by mouth daily.    [provider]  nitroGLYCERIN (NITROSTAT) 0.4 MG SL tablet DISSOLVE 1 TABLET UNDER THE  TONGUE EVERY 5 MINUTES AS NEEDED FOR CHEST PAIN. MAX OF 3  TABLETS IN 15 MINUTES. CALL 911 IF PAIN  PERSISTS. 08/22/23   Pricilla Riffle, MD  pantoprazole (PROTONIX) 40 MG tablet TAKE 1 TABLET BY MOUTH DAILY 06/13/23   Bradd Canary, MD  REPATHA SURECLICK 140 MG/ML SOAJ INJECT 140MG  SUBCUTANEOUSLY  EVERY 2 WEEKS 02/28/23   Pricilla Riffle, MD  rosuvastatin (CRESTOR) 40 MG tablet Take 1 tablet (40 mg total) by mouth daily. 08/19/23   Pricilla Riffle, MD  urea (CARMOL) 10 % cream Apply topically as needed. Patient not taking: Reported on 10/15/2023 12/27/22   Standiford, Jenelle Mages, DPM      Allergies    Zetia [ezetimibe] and Penicillin g    Review of Systems   Review of Systems  Cardiovascular:  Positive for chest pain.  All other systems reviewed and  are negative.   Physical Exam Updated Vital Signs BP (!) 174/93 (BP Location: Left Arm)   Pulse (!) 51   Temp 97.7 F (36.5 C) (Oral)   Resp 18   Ht 5\' 5"  (1.651 m)   Wt 86.2 kg   SpO2 99%   BMI 31.62 kg/m  Physical Exam Vitals and nursing note reviewed.  Constitutional:      Appearance: She is well-developed.  HENT:     Head: Normocephalic and atraumatic.  Cardiovascular:     Rate and Rhythm: Normal rate and regular rhythm.     Heart sounds: No murmur heard. Pulmonary:     Effort: Pulmonary effort is normal. No respiratory distress.     Breath sounds: Normal breath sounds.  Abdominal:     Palpations: Abdomen is soft.     Tenderness: There is no abdominal tenderness. There is no guarding or rebound.  Musculoskeletal:        General: No swelling or tenderness.  Skin:    General: Skin is warm and dry.  Neurological:     Mental Status: She is alert and oriented to person, place, and time.  Psychiatric:        Behavior: Behavior normal.     ED Results / Procedures / Treatments   Labs (all labs ordered are listed, but only abnormal results are displayed) Labs Reviewed  CBC  BASIC METABOLIC PANEL  TROPONIN I (HIGH SENSITIVITY)    EKG None  Radiology No results found.  Procedures Procedures  {Document cardiac monitor, telemetry assessment procedure when appropriate:1}  Medications Ordered in ED Medications - No data to display  ED Course/ Medical Decision Making/ A&P   {   Click here for ABCD2, HEART and other calculatorsREFRESH Note before signing :1}                              Medical Decision Making Amount and/or Complexity of Data Reviewed Labs: ordered. Radiology: ordered.   ***  {Document critical care time when appropriate:1} {Document review of labs and clinical decision tools ie heart score, Chads2Vasc2 etc:1}  {Document your independent review of radiology images, and any outside records:1} {Document your discussion with family  members, caretakers, and with consultants:1} {Document social determinants of health affecting pt's care:1} {Document your decision making why or why not admission, treatments were needed:1} Final Clinical Impression(s) / ED Diagnoses Final diagnoses:  None    Rx / DC Orders ED Discharge Orders     None

## 2023-10-16 NOTE — ED Triage Notes (Signed)
Pt reports she routinely took her BP at home tonight and it was 180/75 and she reports sudden onset of sharp central chest pain \\onset  tonight at 2200. She took two sublingual nitroglycerin tablets and 2 baby aspirins tonight when the pain started. Hx of heart attack with 2 stents.

## 2023-10-17 ENCOUNTER — Ambulatory Visit: Payer: 59

## 2023-10-17 ENCOUNTER — Ambulatory Visit: Payer: 59 | Admitting: Family Medicine

## 2023-10-17 DIAGNOSIS — R072 Precordial pain: Secondary | ICD-10-CM | POA: Diagnosis not present

## 2023-10-17 LAB — TROPONIN I (HIGH SENSITIVITY)
Troponin I (High Sensitivity): 5 ng/L (ref <18)
Troponin I (High Sensitivity): 5 ng/L (ref <18)

## 2023-10-18 DIAGNOSIS — R002 Palpitations: Secondary | ICD-10-CM | POA: Diagnosis not present

## 2023-10-21 ENCOUNTER — Telehealth: Payer: Self-pay | Admitting: Family Medicine

## 2023-10-21 NOTE — Telephone Encounter (Signed)
Pt dropped off paperwork to be completed by pcp. Pt asks to be called when complete so they can pick up.

## 2023-10-21 NOTE — Telephone Encounter (Signed)
Application for handicap placard placed in folder for provider to sign

## 2023-10-22 ENCOUNTER — Emergency Department (HOSPITAL_BASED_OUTPATIENT_CLINIC_OR_DEPARTMENT_OTHER): Payer: 59

## 2023-10-22 ENCOUNTER — Emergency Department (HOSPITAL_BASED_OUTPATIENT_CLINIC_OR_DEPARTMENT_OTHER)
Admission: EM | Admit: 2023-10-22 | Discharge: 2023-10-22 | Disposition: A | Discharge status: Home / Self Care | Payer: 59 | Attending: Emergency Medicine | Admitting: Emergency Medicine

## 2023-10-22 ENCOUNTER — Other Ambulatory Visit: Payer: Self-pay

## 2023-10-22 ENCOUNTER — Telehealth: Payer: Self-pay

## 2023-10-22 ENCOUNTER — Encounter (HOSPITAL_BASED_OUTPATIENT_CLINIC_OR_DEPARTMENT_OTHER): Payer: Self-pay | Admitting: Emergency Medicine

## 2023-10-22 DIAGNOSIS — I700 Atherosclerosis of aorta: Secondary | ICD-10-CM | POA: Diagnosis not present

## 2023-10-22 DIAGNOSIS — R072 Precordial pain: Secondary | ICD-10-CM | POA: Insufficient documentation

## 2023-10-22 DIAGNOSIS — I10 Essential (primary) hypertension: Secondary | ICD-10-CM | POA: Insufficient documentation

## 2023-10-22 DIAGNOSIS — R0602 Shortness of breath: Secondary | ICD-10-CM | POA: Diagnosis not present

## 2023-10-22 DIAGNOSIS — R079 Chest pain, unspecified: Secondary | ICD-10-CM | POA: Diagnosis not present

## 2023-10-22 DIAGNOSIS — I2510 Atherosclerotic heart disease of native coronary artery without angina pectoris: Secondary | ICD-10-CM | POA: Insufficient documentation

## 2023-10-22 LAB — CBC WITH DIFFERENTIAL/PLATELET
Abs Immature Granulocytes: 0.05 10*3/uL (ref 0.00–0.07)
Basophils Absolute: 0.1 10*3/uL (ref 0.0–0.1)
Basophils Relative: 1 %
Eosinophils Absolute: 0.4 10*3/uL (ref 0.0–0.5)
Eosinophils Relative: 4 %
HCT: 41.8 % (ref 36.0–46.0)
Hemoglobin: 13.7 g/dL (ref 12.0–15.0)
Immature Granulocytes: 1 %
Lymphocytes Relative: 24 %
Lymphs Abs: 2.5 10*3/uL (ref 0.7–4.0)
MCH: 28.6 pg (ref 26.0–34.0)
MCHC: 32.8 g/dL (ref 30.0–36.0)
MCV: 87.3 fL (ref 80.0–100.0)
Monocytes Absolute: 0.8 10*3/uL (ref 0.1–1.0)
Monocytes Relative: 8 %
Neutro Abs: 6.6 10*3/uL (ref 1.7–7.7)
Neutrophils Relative %: 62 %
Platelets: 277 10*3/uL (ref 150–400)
RBC: 4.79 MIL/uL (ref 3.87–5.11)
RDW: 13.7 % (ref 11.5–15.5)
WBC: 10.4 10*3/uL (ref 4.0–10.5)
nRBC: 0 % (ref 0.0–0.2)

## 2023-10-22 LAB — BASIC METABOLIC PANEL
Anion gap: 12 (ref 5–15)
BUN: 21 mg/dL (ref 8–23)
CO2: 25 mmol/L (ref 22–32)
Calcium: 7.6 mg/dL — ABNORMAL LOW (ref 8.9–10.3)
Chloride: 100 mmol/L (ref 98–111)
Creatinine, Ser: 1.09 mg/dL — ABNORMAL HIGH (ref 0.44–1.00)
GFR, Estimated: 53 mL/min — ABNORMAL LOW (ref >60)
Glucose, Bld: 103 mg/dL — ABNORMAL HIGH (ref 70–99)
Potassium: 3.9 mmol/L (ref 3.5–5.1)
Sodium: 137 mmol/L (ref 135–145)

## 2023-10-22 LAB — D-DIMER, QUANTITATIVE: D-Dimer, Quant: 0.75 ug{FEU}/mL — ABNORMAL HIGH (ref 0.00–0.50)

## 2023-10-22 LAB — TROPONIN I (HIGH SENSITIVITY)
Troponin I (High Sensitivity): 5 ng/L (ref <18)
Troponin I (High Sensitivity): 5 ng/L (ref <18)

## 2023-10-22 MED ORDER — IOHEXOL 350 MG/ML SOLN
75.0000 mL | Freq: Once | INTRAVENOUS | Status: AC | PRN
  Administered 2023-10-22: 75 mL via INTRAVENOUS

## 2023-10-22 NOTE — Telephone Encounter (Signed)
I called [facility] to see the pt history of filling her Chol meds and she has:   Repatha:  Last fills: 03/29/23, 06/18/23, 09/05/23 all for 3 months each  Rosuvastatin 40 mg: May and June 2024 both for 100 tabs  and June 2024 for 100 tabs  Will forward to Dr [name].

## 2023-10-22 NOTE — Discharge Instructions (Signed)

## 2023-10-22 NOTE — Telephone Encounter (Signed)
-----   Message from Olene Floss sent at 10/21/2023  7:18 AM EDT ----- Her LDL-C has been significantly better in the past. Fill hx looks like she is taking rosuvastatin 40mg  and Repatha but I think compliance needs to be addressed. It has been increasing over the last 1-2 years. Rarely can you build antibodies against Repatha but I think compliance really needs to be assessed. ----- Message ----- From: Pricilla Riffle, MD Sent: 10/18/2023   9:30 AM EDT To: Olene Floss, RPH-CPP; Bertram Millard, RN  CBC is normal  Thyroid function normal Electrolytes and kidney function are OK  Liver function is OK Will review lipids with pharmacy   LDL has been better

## 2023-10-22 NOTE — ED Triage Notes (Signed)
Pt c/o episode of stabbing CP about 1 hr ago; repeated x 1; took NTG x 1 and 4 baby ASA without relief; wearing a heart monitor; denies other sxs

## 2023-10-22 NOTE — ED Notes (Signed)
D/c paperwork reviewed with pt, including follow up care.  No questions or concerns voiced at time of d/c. Marland Kitchen Pt verbalized understanding, Ambulatory with family to ED exit, NAD.

## 2023-10-22 NOTE — ED Provider Notes (Signed)
Emergency Department Provider Note   I have reviewed the triage vital signs and the nursing notes.   HISTORY  Chief Complaint Chest Pain   HPI [name] is a 77 y.o. female with past history of below presents emergency department with intermittent sharp, left chest pain starting today.  Patient has had approximately 4 episodes of sharp, momentary discomfort in the left chest without shortness of breath.  Symptoms are not clearly provoked by any particular activity.  She notes a remote history of ACS which felt similar but was only 1 sharp pain at that time.  No fevers or chills.  No active discomfort at this time.  She notes that she was seen in the emergency department 6 days prior but pain was very different on that occasion.   Past Medical History:  Diagnosis Date   Acute MI, subendocardial (HCC)    Arthritis    Back pain 05/31/2013   Benign paroxysmal positional vertigo 08/05/2016   CAD (coronary artery disease)    a. NSTEMI 10/2011: LHC 11/19/11: pLAD 30%, oOM 60%, AVCFX 30%, CFX after OM2 30%, pRCA 60 and 70%, then 99%, AM 80-90% with TIMI 3 flow.  PCI: Promus DES x 2 to RCA; b. 06/2012 Cath: patent RCA stents w/ subtl occl of Acute Marginal (jailed)->Med rx; c. 05/2015 MV: EF 59%, mod mid infsept/inf/ap lat/ap infarct with peri-infarct isch-->Med Rx; d. 02/2016 Cath: LM nl, LAD 66m, RI 50, RCA patent stents.   Colitis    Facial skin lesion 01/28/2017   GERD (gastroesophageal reflux disease)    occasional   Hyperlipidemia    Hypertension    Hypertensive heart disease    a. Echocardiogram 11/19/11: Difficult acoustic windows, EF 60-65%, normal LV wall thickness, grade 1 diastolic dysfunction   Hypoparathyroidism (HCC)    Low zinc level 11/26/2016   Multinodular thyroid    Goiter s/p thyroidectomy in 2007 with post-op  hypocalcemia and post-op hypothyroidism/hypoparathyroidism with hypocalcemia   Neck pain on right side 07/13/2013   Nocturia 05/31/2013   Osteoarthritis     Pain in right axilla 03/13/2016   Palpitations    a. 03/2012 - patient set up for event monitor but did not wear correctly -  she declined wearing a repeat monitor   Post-surgical hypothyroidism    Sun-damaged skin 12/05/2014   Tubular adenoma of colon 06/2011   Unspecified constipation 05/31/2013   Vertigo    Zinc deficiency 11/26/2015    Review of Systems  Constitutional: No fever/chills Cardiovascular: Positive chest pain. Respiratory: Denies shortness of breath. Gastrointestinal: No abdominal pain.  No nausea, no vomiting.   Genitourinary: Negative for dysuria. Musculoskeletal: Negative for back pain. Skin: Negative for rash. Neurological: Negative for headaches.  ____________________________________________   PHYSICAL EXAM:  VITAL SIGNS: ED Triage Vitals  Encounter Vitals Group     BP 10/22/23 1854 129/71     Pulse Rate 10/22/23 1854 (!) 58     Resp 10/22/23 1854 20     Temp 10/22/23 1854 98 F (36.7 C)     Temp src --      SpO2 10/22/23 1854 97 %     Weight 10/22/23 1849 190 lb 0.6 oz (86.2 kg)     Height 10/22/23 1849 5\' 5"  (1.651 m)   Constitutional: Alert and oriented. Well appearing and in no acute distress. Eyes: Conjunctivae are normal.  Head: Atraumatic. Nose: No congestion/rhinnorhea. Mouth/Throat: Mucous membranes are moist.  Neck: No stridor.   Cardiovascular: Normal rate, regular rhythm. Good peripheral  circulation. Grossly normal heart sounds.   Respiratory: Normal respiratory effort.  No retractions. Lungs CTAB. Gastrointestinal: No distention.  Musculoskeletal: No gross deformities of extremities. Neurologic:  Normal speech and language. Skin:  Skin is warm, dry and intact. No rash noted.   ____________________________________________   LABS (all labs ordered are listed, but only abnormal results are displayed)  Labs Reviewed  BASIC METABOLIC PANEL - Abnormal; Notable for the following components:      Result Value   Glucose, Bld 103 (*)     Creatinine, Ser 1.09 (*)    Calcium 7.6 (*)    GFR, Estimated 53 (*)    All other components within normal limits  D-DIMER, QUANTITATIVE - Abnormal; Notable for the following components:   D-Dimer, Quant 0.75 (*)    All other components within normal limits  CBC WITH DIFFERENTIAL/PLATELET  TROPONIN I (HIGH SENSITIVITY)  TROPONIN I (HIGH SENSITIVITY)   ____________________________________________  EKG   EKG Interpretation Date/Time:  Tuesday October 22 2023 18:56:05 EDT Ventricular Rate:  56 PR Interval:  190 QRS Duration:  138 QT Interval:  494 QTC Calculation: 476 R Axis:   -21  Text Interpretation: Sinus bradycardia Right bundle branch block Abnormal ECG When compared with ECG of 16-Oct-2023 23:26, PREVIOUS ECG IS PRESENT Some baseline wander noted. Similar to October 23 tracing Confirmed by Alona Bene [phone]) on 10/22/2023 6:58:50 PM        ____________________________________________  RADIOLOGY  CT Angio Chest PE W and/or Wo Contrast  Result Date: 10/22/2023 CLINICAL DATA:  Left-sided chest pain for 1 hour EXAM: CT ANGIOGRAPHY CHEST WITH CONTRAST TECHNIQUE: Multidetector CT imaging of the chest was performed using the standard protocol during bolus administration of intravenous contrast. Multiplanar CT image reconstructions and MIPs were obtained to evaluate the vascular anatomy. RADIATION DOSE REDUCTION: This exam was performed according to the departmental dose-optimization program which includes automated exposure control, adjustment of the mA and/or kV according to patient size and/or use of iterative reconstruction technique. CONTRAST:  75mL OMNIPAQUE IOHEXOL 350 MG/ML SOLN COMPARISON:  Same-day radiograph and CTA chest 03/06/2018 FINDINGS: Cardiovascular: Negative for acute pulmonary embolism. Normal caliber thoracic aorta. No pericardial effusion. Coronary artery and aortic atherosclerotic calcification. Mediastinum/Nodes: Trachea and esophagus are unremarkable. No  thoracic adenopathy Lungs/Pleura: No focal consolidation, pleural effusion, or pneumothorax. Upper Abdomen: No acute abnormality. Musculoskeletal: No acute fracture. Review of the MIP images confirms the above findings. IMPRESSION: Negative for acute pulmonary embolism. No acute abnormality in the chest. Aortic Atherosclerosis (ICD10-I70.0). Electronically Signed   By: Minerva Fester M.D.   On: 10/22/2023 22:18   DG Chest 2 View  Result Date: 10/22/2023 CLINICAL DATA:  Left-sided chest pain for 1 hour EXAM: CHEST - 2 VIEW COMPARISON:  Radiographs 10/16/2023 FINDINGS: External heart monitor overlies the lower trachea. The heart size and mediastinal contours are within normal limits. Aortic atherosclerotic calcification. Both lungs are clear. The visualized skeletal structures are unremarkable. IMPRESSION: No active cardiopulmonary disease. Electronically Signed   By: Minerva Fester M.D.   On: 10/22/2023 22:14    ____________________________________________   PROCEDURES  Procedure(s) performed:   Procedures  None ____________________________________________   INITIAL IMPRESSION / ASSESSMENT AND PLAN / ED COURSE  Pertinent labs & imaging results that were available during my care of the patient were reviewed by me and considered in my medical decision making (see chart for details).   This patient is Presenting for Evaluation of CP, which does require a range of treatment options, and is a complaint that  involves a high risk of morbidity and mortality.  The Differential Diagnoses includes but is not exclusive to acute coronary syndrome, aortic dissection, pulmonary embolism, cardiac tamponade, community-acquired pneumonia, pericarditis, musculoskeletal chest wall pain, etc.   Critical Interventions-    Medications  iohexol (OMNIPAQUE) 350 MG/ML injection 75 mL (75 mLs Intravenous Contrast Given 10/22/23 2020)    Reassessment after intervention: No return of CP.    I did obtain  Additional Historical Information from family at bedside.   I decided to review pertinent External Data, and in summary patient with last ED visit 6 days prior. Reassuring ACS workup at that time.   Clinical Laboratory Tests Ordered, included d dimer which was elevated.  No anemia.  No acute kidney injury. Troponin negative x 2.   Radiologic Tests Ordered, included CXR. I independently interpreted the images and agree with radiology interpretation.   Cardiac Monitor Tracing which shows NSR.    Social Determinants of Health Risk patient is a non-smoker.   Medical Decision Making: Summary:  Patient presents emergency department for evaluation of chest pain.  Fairly atypical presentation.  Plan for serial troponin and D-dimer given the sharp, sudden quality.  Reevaluation with update and discussion with patient and family.  D-dimer elevated prompting CTA chest.  No acute process identified.  Plan for d/c with established Cardiology and PCP follow up.   Considered admission but workup is reassuring.   Patient's presentation is most consistent with acute presentation with potential threat to life or bodily function.   Disposition: discharge  ____________________________________________  FINAL CLINICAL IMPRESSION(S) / ED DIAGNOSES  Final diagnoses:  Precordial chest pain    Note:  This document was prepared using Dragon voice recognition software and may include unintentional dictation errors.  Alona Bene, MD, [facility]    Deneene Tarver, Arlyss Repress, MD 10/22/23 [phone]

## 2023-10-23 ENCOUNTER — Telehealth: Payer: Self-pay | Admitting: Internal Medicine

## 2023-10-23 NOTE — Telephone Encounter (Signed)
  Per MyChart Scheduling message:  Sent yesterday afternoon:  I have been to the hospital twice now. I am there now in the ER with heart and chest pain. I need Dr. [name] to follow up on me and make sure she is reading my results. I am now second time in hospital with same situation  and I need her to review my results. Especially the ones I did most recently in my hospital visit including CT scan. Please get back to me ASAP. I can't always check messages but she can call me at [phone] or call my daughter vivien at [phone]

## 2023-10-23 NOTE — Telephone Encounter (Signed)
Patient has been notified that paperwork is complete and is available for her to pick up at the front desk.

## 2023-10-23 NOTE — Telephone Encounter (Signed)
OV with Dr [name] 10/25/23

## 2023-10-23 NOTE — Telephone Encounter (Signed)
Left a message for the pt to call back.  

## 2023-10-23 NOTE — Telephone Encounter (Signed)
PI called the pt to follow up with her re: her recent ED visits for chest pain.   Pt says she had brief sharp pains in her epigastric area and left side.. no SOB or dizziness. The pain occurred at rest. She says not related to exertion.   She says she feels well today.. she is home resting and has not had any further pain.   I will forward to Dr [name] for her review.

## 2023-10-25 ENCOUNTER — Encounter: Payer: Self-pay | Admitting: Internal Medicine

## 2023-10-25 ENCOUNTER — Ambulatory Visit: Payer: 59 | Attending: Internal Medicine | Admitting: Internal Medicine

## 2023-10-25 VITALS — BP 126/70 | HR 55 | Ht 65.0 in | Wt 195.0 lb

## 2023-10-25 DIAGNOSIS — R531 Weakness: Secondary | ICD-10-CM

## 2023-10-25 DIAGNOSIS — I10 Essential (primary) hypertension: Secondary | ICD-10-CM | POA: Diagnosis not present

## 2023-10-25 DIAGNOSIS — R42 Dizziness and giddiness: Secondary | ICD-10-CM | POA: Diagnosis not present

## 2023-10-25 MED ORDER — AMLODIPINE BESYLATE 10 MG PO TABS: 10.0000 mg | ORAL_TABLET | Freq: Every day | ORAL | 2 refills | Status: DC

## 2023-10-25 NOTE — Patient Instructions (Signed)
Medication Instructions:  Continue increased dose of amlodipine 10 mg by mouth daily  *If you need a refill on your cardiac medications before your next appointment, please call your pharmacy*   Lab Work: Lab work to be done today--Zinc level If you have labs (blood work) drawn today and your tests are completely normal, you will receive your results only by: MyChart Message (if you have MyChart) OR A paper copy in the mail If you have any lab test that is abnormal or we need to change your treatment, we will call you to review the results.   Testing/Procedures: none   Follow-Up: At [facility], you and your health needs are our priority.  As part of our continuing mission to provide you with exceptional heart care, we have created designated Provider Care Teams.  These Care Teams include your primary Cardiologist (physician) and Advanced Practice Providers (APPs -  Physician Assistants and Nurse Practitioners) who all work together to provide you with the care you need, when you need it.  We recommend signing up for the patient portal called "MyChart".  Sign up information is provided on this After Visit Summary.  MyChart is used to connect with patients for Virtual Visits (Telemedicine).  Patients are able to view lab/test results, encounter notes, upcoming appointments, etc.  Non-urgent messages can be sent to your provider as well.   To learn more about what you can do with MyChart, go to [email or web address].    Your next appointment:   December 16, 2023 at 11:40  Provider:   Dietrich Pates, MD     Other Instructions

## 2023-10-25 NOTE — Progress Notes (Unsigned)
Cardiology Office Note   Date:  10/25/2023   ID:  [name], DOB [birth year], MRN [id number]  PCP:  Bradd Canary, MD  Cardiologist:   Dietrich Pates, MD   F/U of CAD    History of Present Illness:  [name] is a 77 y.o. female with CAD - s/p NSTEMI 10/2011 with DESx2  Also has a hx of  GERD, HTN, HLD, vertigo.  L heart cath in Dec 2019 showed:  LAD with 25%  OM  with 100% with Left to R  collaterals  RCA with patent stent   Normal LVEF    Echo in early Sept 2020   LVEF and RVEF were normal   Mild diastolic dysfunction      In 2023 the pt had some numbness on L face   Seen by  neuro, ultimately felt not to represent a TIA  Echo done   Normal   Carotid USN without severe dz   Symptoms felt to be due to DJD of neck     April 2024  Seen in ER for CP/shoulder pain  BP severely elevated   Symptoms eased with NTG   I saw the pt in JUne 2024 Since seen the pt says she is fatigued , weak    No CP  BP at home was 105/45  I aw the pt in AUgust 2024  and I recomm that she back down on amlodipine to 5 mg daily     Oct 15, 2023-  Pt seen in clinic  [place] about 2 wks ago she started having spells where she feels "light"    Not dizzy   Felt like she has to catch her breath.   Episodes last about 1 minute Denies CP   One episode occurred while driving   One woke her from sleep   Felt OK after the spells   Pt set up for an event monitor    Since I saw her in October, the patient has been to ER twice for CP  First visit was on 10/23   She was at home   Had dull pain in chest   BP was very high   180   Took 2 ASA and an NTG.   BP stayed high  Went to ER   There it wa in the 150s  Trops negative   Pain eased off Second visit was on 10/29   She was in bed when she developed a knife like pain in cest   L parasternal   Took ASA and NTG  Pain came back x 3  Brief  Not pleuritic Went to ER  BP 129/  CTA negative  Sent home  Since home she denise CP   Breathing is OK      Current Meds   Medication Sig   amLODipine (NORVASC) 5 MG tablet Take 1 tablet (5 mg total) by mouth daily.   aspirin EC 81 MG tablet Take 81 mg by mouth daily.   B Complex-C (B-COMPLEX WITH VITAMIN C) tablet Take 1 tablet by mouth daily.   bisoprolol (ZEBETA) 5 MG tablet TAKE 1 TABLET BY MOUTH DAILY   calcitRIOL (ROCALTROL) 0.25 MCG capsule TAKE 3 CAPSULES BY MOUTH DAILY   Cholecalciferol (VITAMIN D) 125 MCG (5000 UT) CAPS Take 5,000 Units by mouth daily in the afternoon.   ciclopirox (PENLAC) 8 % solution Apply topically at bedtime. Apply over nail and surrounding skin. Apply daily over previous coat. After seven (  7) days, may remove with alcohol and continue cycle.   clopidogrel (PLAVIX) 75 MG tablet Take 1 tablet (75 mg total) by mouth daily.   diazepam (VALIUM) 5 MG tablet Take 1 tablet (5 mg total) by mouth daily as needed for anxiety (MRI). Take 1 one hour prior to MRI and another when arrive   furosemide (LASIX) 20 MG tablet TAKE 1 TABLET BY MOUTH ONCE  WEEKLY AS NEEDED   gabapentin (NEURONTIN) 100 MG capsule Take 2 capsules (200 mg total) by mouth at bedtime.   hydrALAZINE (APRESOLINE) 10 MG tablet TAKE 1/2 TABLET (5 MG) BY MOUTH AS NEEDED FOR BP ABOVE 160 OR GREATER   isosorbide mononitrate (IMDUR) 60 MG 24 hr tablet TAKE 1 TABLET BY MOUTH DAILY   levothyroxine (SYNTHROID) 112 MCG tablet Take 1 tablet (112 mcg total) by mouth 3 (three) times a week.   Multiple Vitamin (MULTIVITAMIN WITH MINERALS) TABS tablet Take 1 tablet by mouth daily.   nitroGLYCERIN (NITROSTAT) 0.4 MG SL tablet DISSOLVE 1 TABLET UNDER THE  TONGUE EVERY 5 MINUTES AS NEEDED FOR CHEST PAIN. MAX OF 3 TABLETS IN 15 MINUTES. CALL 911 IF PAIN  PERSISTS.   pantoprazole (PROTONIX) 40 MG tablet TAKE 1 TABLET BY MOUTH DAILY   REPATHA SURECLICK 140 MG/ML SOAJ INJECT 140MG  SUBCUTANEOUSLY  EVERY 2 WEEKS   rosuvastatin (CRESTOR) 40 MG tablet Take 1 tablet (40 mg total) by mouth daily.   urea (CARMOL) 10 % cream Apply topically as needed.      Allergies:   Zetia [ezetimibe] and Penicillin g   Past Medical History:  Diagnosis Date   Acute MI, subendocardial (HCC)    Arthritis    Back pain 05/31/2013   Benign paroxysmal positional vertigo 08/05/2016   CAD (coronary artery disease)    a. NSTEMI 10/2011: LHC 11/19/11: pLAD 30%, oOM 60%, AVCFX 30%, CFX after OM2 30%, pRCA 60 and 70%, then 99%, AM 80-90% with TIMI 3 flow.  PCI: Promus DES x 2 to RCA; b. 06/2012 Cath: patent RCA stents w/ subtl occl of Acute Marginal (jailed)->Med rx; c. 05/2015 MV: EF 59%, mod mid infsept/inf/ap lat/ap infarct with peri-infarct isch-->Med Rx; d. 02/2016 Cath: LM nl, LAD 59m, RI 50, RCA patent stents.   Colitis    Facial skin lesion 01/28/2017   GERD (gastroesophageal reflux disease)    occasional   Hyperlipidemia    Hypertension    Hypertensive heart disease    a. Echocardiogram 11/19/11: Difficult acoustic windows, EF 60-65%, normal LV wall thickness, grade 1 diastolic dysfunction   Hypoparathyroidism (HCC)    Low zinc level 11/26/2016   Multinodular thyroid    Goiter s/p thyroidectomy in 2007 with post-op  hypocalcemia and post-op hypothyroidism/hypoparathyroidism with hypocalcemia   Neck pain on right side 07/13/2013   Nocturia 05/31/2013   Osteoarthritis    Pain in right axilla 03/13/2016   Palpitations    a. 03/2012 - patient set up for event monitor but did not wear correctly -  she declined wearing a repeat monitor   Post-surgical hypothyroidism    Sun-damaged skin 12/05/2014   Tubular adenoma of colon 06/2011   Unspecified constipation 05/31/2013   Vertigo    Zinc deficiency 11/26/2015    Past Surgical History:  Procedure Laterality Date   CARDIAC CATHETERIZATION N/A 03/08/2016   Procedure: Left Heart Cath and Coronary Angiography;  Surgeon: Corky Crafts, MD;  Location: [facility] INVASIVE CV LAB;  Service: Cardiovascular;  Laterality: N/A;   COLONOSCOPY  Aenomatous polyps   07/05/2011  COLONOSCOPY N/A 09/28/2014   Procedure: COLONOSCOPY;   Surgeon: Meryl Dare, MD;  Location: [facility] ENDOSCOPY;  Service: Endoscopy;  Laterality: N/A;   CORONARY ANGIOPLASTY WITH STENT PLACEMENT     LEFT HEART CATH AND CORONARY ANGIOGRAPHY N/A 12/09/2018   Procedure: LEFT HEART CATH AND CORONARY ANGIOGRAPHY;  Surgeon: Corky Crafts, MD;  Location: [facility] INVASIVE CV LAB;  Service: Cardiovascular;  Laterality: N/A;   LEFT HEART CATHETERIZATION WITH CORONARY ANGIOGRAM N/A 07/17/2012   Procedure: LEFT HEART CATHETERIZATION WITH CORONARY ANGIOGRAM;  Surgeon: Herby Abraham, MD;  Location: [facility] CATH LAB;  Service: Cardiovascular;  Laterality: N/A;   PERCUTANEOUS CORONARY STENT INTERVENTION (PCI-S) N/A 11/19/2011   Procedure: PERCUTANEOUS CORONARY STENT INTERVENTION (PCI-S);  Surgeon: Herby Abraham, MD;  Location: [facility] CATH LAB;  Service: Cardiovascular;  Laterality: N/A;   POLYPECTOMY     SHOULDER ARTHROSCOPY W/ ROTATOR CUFF REPAIR     right   TOTAL THYROIDECTOMY  2007   GOITER     Social History:  The patient  reports that she has never smoked. She has never used smokeless tobacco. She reports that she does not currently use alcohol. She reports that she does not use drugs.   Family History:  The patient's family history includes Blindness in her sister; Cancer in her brother and brother; Colon cancer in her brother; Congestive Heart Failure in her sister; Healthy in her daughter, daughter, daughter, and son; Heart attack in her father; Heart disease in her father; Hypertension in her father and sister; Thyroid disease in her sister.    ROS:  Please see the history of present illness. All other systems are reviewed and  Negative to the above problem except as noted.    PHYSICAL EXAM: VS:  BP 126/70   Pulse (!) 55   Ht 5\' 5"  (1.651 m)   Wt 195 lb (88.5 kg)   SpO2 96%   BMI 32.45 kg/m   GEN: Well nourished, well developed, in no acute distress  HEENT: normal  Neck: JVP is not elevated   No bruits Cardiac: RRR; no murmurs  No LE  edema   Respiratory:  clear to auscultation  GI: soft, nontender,No hepatomegaly   EKG:  EKG is not  done    Echo   Feb 2024   1. Left ventricular ejection fraction, by estimation, is 60 to 65%. The  left ventricle has normal function. The left ventricle has no regional  wall motion abnormalities. Left ventricular diastolic parameters are  consistent with Grade I diastolic  dysfunction (impaired relaxation).   2. Right ventricular systolic function is normal. The right ventricular  size is normal. There is normal pulmonary artery systolic pressure. The  estimated right ventricular systolic pressure is 24.5 mmHg.   3. The mitral valve is normal in structure. No evidence of mitral valve  regurgitation. No evidence of mitral stenosis.   4. The aortic valve is tricuspid. Aortic valve regurgitation is trivial.  Aortic valve sclerosis is present, with no evidence of aortic valve  stenosis. Aortic valve area, by VTI measures 2.64 cm. Aortic valve mean  gradient measures 4.0 mmHg. Aortic  valve Vmax measures 1.33 m/s.   5. The inferior vena cava is dilated in size with >50% respiratory  variability, suggesting right atrial pressure of 8 mmHg.   Conclusion(s)/Recommendation(s): No intracardiac source of embolism  detected on this transthoracic study. Consider a transesophageal  echocardiogram to exclude cardiac source of embolism if clinically  indicated.   Echo   Sept  2023   1. Left ventricular ejection fraction, by estimation, is 60 to 65%. The  left ventricle has normal function. The left ventricle has no regional  wall motion abnormalities. There is mild left ventricular hypertrophy.  Left ventricular diastolic parameters  were normal.   2. Right ventricular systolic function is normal. The right ventricular  size is normal.   3. The mitral valve is abnormal. Trivial mitral valve regurgitation. No  evidence of mitral stenosis.   4. The aortic valve is tricuspid. Aortic valve  regurgitation is trivial.  No aortic stenosis is present.   5. The inferior vena cava is normal in size with greater than 50%  respiratory variability, suggesting right atrial pressure of 3 mmHg.    Lipid Panel    Component Value Date/Time   CHOL 200 02/05/2023 2259   CHOL 172 10/11/2022 1050   TRIG 178 (H) 02/05/2023 2259   HDL 52 02/05/2023 2259   HDL 43 10/11/2022 1050   CHOLHDL 3.8 02/05/2023 2259   VLDL 36 02/05/2023 2259   LDLCALC 112 (H) 02/05/2023 2259   LDLCALC 101 (H) 10/11/2022 1050   LDLDIRECT 96.0 11/06/2022 1623      Wt Readings from Last 3 Encounters:  10/25/23 195 lb (88.5 kg)  10/22/23 190 lb 0.6 oz (86.2 kg)  10/16/23 190 lb (86.2 kg)      ASSESSMENT AND PLAN:  1 CP   Two recent spells  Very different in nature   The first episode was pressure like   At time BP was very high   This may have exacerbated symptoms    Second spell sounds more musculoskeletal I would work on BP control and follow   I am not convinced they represent ischemia/angina  2  CAD   Known dz with occluded OM that fills via collaterals  Rx risk factors  3  BP   With lable HTN I would go back up on amlodipine to 10 mg per day  FOllow BP  Keep on other meds   If remains high would consider renal USN  4  HL    Last LDL was 113  HDL 49  On Crestor 40 a  Need to confirm REpatha.     Follow up in new year        Current medicines are reviewed at length with the patient today.  The patient does not have concerns regarding medicines.  Signed, Dietrich Pates, MD  10/25/2023 10:21 AM    [facility] [address] [place], [place], [place]  [address] Phone: [phone]; Fax: [phone]

## 2023-10-29 LAB — SPECIMEN STATUS REPORT

## 2023-10-30 LAB — ZINC: Zinc: 84 ug/dL (ref 44–115)

## 2023-10-31 ENCOUNTER — Ambulatory Visit: Payer: 59 | Attending: Internal Medicine

## 2023-10-31 DIAGNOSIS — R002 Palpitations: Secondary | ICD-10-CM

## 2023-11-01 NOTE — Progress Notes (Unsigned)
[name] [address] [place] [address] Phone: [phone] Subjective:   [name], am serving as a scribe for Dr. Antoine Primas.  I'm seeing this patient by the request  of:  Bradd Canary, MD  CC: Foot pain follow-up  [id number]  08/05/2023 Patient has responded extremely well to the injection at this time.  The patient is still somewhat noncompliant with using certain recovery sandals that I think can be beneficial.  Discussed icing regimen and home exercises in which activities to avoid.  We discussed proper shoes again.  Patient may even tried to tie shoes differently where there is no pressure on the midfoot.  Follow-up again in 6 to 8 weeks otherwise.      Update 11/05/2023 [name] is a 77 y.o. female coming in with complaint of L midfoot pain. Patient states that her pain has improved but notices swelling throughout various areas of her foot. Injected helped for R foot pain.   Concerned with dry feet and nail fungus.   Recently in the hospital for dizziness and chest discomfort.  Seems to be having difficulty with high blood pressure.  Restarted amlodipine.    Past Medical History:  Diagnosis Date   Acute MI, subendocardial (HCC)    Arthritis    Back pain 05/31/2013   Benign paroxysmal positional vertigo 08/05/2016   CAD (coronary artery disease)    a. NSTEMI 10/2011: LHC 11/19/11: pLAD 30%, oOM 60%, AVCFX 30%, CFX after OM2 30%, pRCA 60 and 70%, then 99%, AM 80-90% with TIMI 3 flow.  PCI: Promus DES x 2 to RCA; b. 06/2012 Cath: patent RCA stents w/ subtl occl of Acute Marginal (jailed)->Med rx; c. 05/2015 MV: EF 59%, mod mid infsept/inf/ap lat/ap infarct with peri-infarct isch-->Med Rx; d. 02/2016 Cath: LM nl, LAD 15m, RI 50, RCA patent stents.   Colitis    Facial skin lesion 01/28/2017   GERD (gastroesophageal reflux disease)    occasional   Hyperlipidemia    Hypertension    Hypertensive heart disease    a.  Echocardiogram 11/19/11: Difficult acoustic windows, EF 60-65%, normal LV wall thickness, grade 1 diastolic dysfunction   Hypoparathyroidism (HCC)    Low zinc level 11/26/2016   Multinodular thyroid    Goiter s/p thyroidectomy in 2007 with post-op  hypocalcemia and post-op hypothyroidism/hypoparathyroidism with hypocalcemia   Neck pain on right side 07/13/2013   Nocturia 05/31/2013   Osteoarthritis    Pain in right axilla 03/13/2016   Palpitations    a. 03/2012 - patient set up for event monitor but did not wear correctly -  she declined wearing a repeat monitor   Post-surgical hypothyroidism    Sun-damaged skin 12/05/2014   Tubular adenoma of colon 06/2011   Unspecified constipation 05/31/2013   Vertigo    Zinc deficiency 11/26/2015   Past Surgical History:  Procedure Laterality Date   CARDIAC CATHETERIZATION N/A 03/08/2016   Procedure: Left Heart Cath and Coronary Angiography;  Surgeon: Corky Crafts, MD;  Location: [facility] INVASIVE CV LAB;  Service: Cardiovascular;  Laterality: N/A;   COLONOSCOPY  Aenomatous polyps   07/05/2011   COLONOSCOPY N/A 09/28/2014   Procedure: COLONOSCOPY;  Surgeon: Meryl Dare, MD;  Location: [facility] ENDOSCOPY;  Service: Endoscopy;  Laterality: N/A;   CORONARY ANGIOPLASTY WITH STENT PLACEMENT     LEFT HEART CATH AND CORONARY ANGIOGRAPHY N/A 12/09/2018   Procedure: LEFT HEART CATH AND CORONARY ANGIOGRAPHY;  Surgeon: Corky Crafts, MD;  Location:  [facility] INVASIVE CV LAB;  Service: Cardiovascular;  Laterality: N/A;   LEFT HEART CATHETERIZATION WITH CORONARY ANGIOGRAM N/A 07/17/2012   Procedure: LEFT HEART CATHETERIZATION WITH CORONARY ANGIOGRAM;  Surgeon: Herby Abraham, MD;  Location: [facility] CATH LAB;  Service: Cardiovascular;  Laterality: N/A;   PERCUTANEOUS CORONARY STENT INTERVENTION (PCI-S) N/A 11/19/2011   Procedure: PERCUTANEOUS CORONARY STENT INTERVENTION (PCI-S);  Surgeon: Herby Abraham, MD;  Location: [facility] CATH LAB;  Service: Cardiovascular;  Laterality: N/A;    POLYPECTOMY     SHOULDER ARTHROSCOPY W/ ROTATOR CUFF REPAIR     right   TOTAL THYROIDECTOMY  2007   GOITER   Social History   Socioeconomic History   Marital status: Married    Spouse name: Not on file   Number of children: 4   Years of education: 14   Highest education level: Not on file  Occupational History   Occupation: Sales person at [employer]  Tobacco Use   Smoking status: Never   Smokeless tobacco: Never  Vaping Use   Vaping status: Never Used  Substance and Sexual Activity   Alcohol use: Not Currently   Drug use: No   Sexual activity: Not Currently  Other Topics Concern   Not on file  Social History Narrative   Lives at home alone.  Her son lives near her.   Right-handed.   1 cup coffee per day.   Social Determinants of Health   Financial Resource Strain: Low Risk  (05/07/2022)   Overall Financial Resource Strain (CARDIA)    Difficulty of Paying Living Expenses: Not hard at all  Food Insecurity: No Food Insecurity (06/05/2023)   Hunger Vital Sign    Worried About Running Out of Food in the Last Year: Never true    Ran Out of Food in the Last Year: Never true  Transportation Needs: No Transportation Needs (06/05/2023)   PRAPARE - Administrator, Civil Service (Medical): No    Lack of Transportation (Non-Medical): No  Physical Activity: Sufficiently Active (05/07/2022)   Exercise Vital Sign    Days of Exercise per Week: 7 days    Minutes of Exercise per Session: 30 min  Stress: No Stress Concern Present (05/07/2022)   [employer] of Occupational Health - Occupational Stress Questionnaire    Feeling of Stress : Not at all  Social Connections: Unknown (08/22/2022)   Received from [facility]   Social Network    Social Network: Not on file   Allergies  Allergen Reactions   Zetia [Ezetimibe] Other (See Comments)    Myalgia, paresthesias and weakness   Penicillin G Rash   Family History  Problem Relation Age of Onset   Colon  cancer Brother    Cancer Brother        COLON   Hypertension Father    Heart disease Father    Heart attack Father    Blindness Sister    Congestive Heart Failure Sister    Hypertension Sister    Healthy Daughter    Healthy Son    Thyroid disease Sister    Cancer Brother        multiple myelomas   Healthy Daughter    Healthy Daughter    Diabetes Neg Hx    Prostate cancer Neg Hx    Breast cancer Neg Hx    Esophageal cancer Neg Hx    Rectal cancer Neg Hx    Stomach cancer Neg Hx     Current Outpatient Medications (Endocrine &  Metabolic):    calcitRIOL (ROCALTROL) 0.25 MCG capsule, TAKE 3 CAPSULES BY MOUTH DAILY   levothyroxine (SYNTHROID) 112 MCG tablet, Take 1 tablet (112 mcg total) by mouth 3 (three) times a week.  Current Outpatient Medications (Cardiovascular):    amLODipine (NORVASC) 10 MG tablet, Take 1 tablet (10 mg total) by mouth daily.   bisoprolol (ZEBETA) 5 MG tablet, TAKE 1 TABLET BY MOUTH DAILY   furosemide (LASIX) 20 MG tablet, TAKE 1 TABLET BY MOUTH ONCE  WEEKLY AS NEEDED   hydrALAZINE (APRESOLINE) 10 MG tablet, TAKE 1/2 TABLET (5 MG) BY MOUTH AS NEEDED FOR BP ABOVE 160 OR GREATER   isosorbide mononitrate (IMDUR) 60 MG 24 hr tablet, TAKE 1 TABLET BY MOUTH DAILY   nitroGLYCERIN (NITROSTAT) 0.4 MG SL tablet, DISSOLVE 1 TABLET UNDER THE  TONGUE EVERY 5 MINUTES AS NEEDED FOR CHEST PAIN. MAX OF 3 TABLETS IN 15 MINUTES. CALL 911 IF PAIN  PERSISTS.   REPATHA SURECLICK 140 MG/ML SOAJ, INJECT 140MG  SUBCUTANEOUSLY  EVERY 2 WEEKS   rosuvastatin (CRESTOR) 40 MG tablet, Take 1 tablet (40 mg total) by mouth daily.   Current Outpatient Medications (Analgesics):    aspirin EC 81 MG tablet, Take 81 mg by mouth daily.  Current Outpatient Medications (Hematological):    clopidogrel (PLAVIX) 75 MG tablet, Take 1 tablet (75 mg total) by mouth daily.  Current Outpatient Medications (Other):    B Complex-C (B-COMPLEX WITH VITAMIN C) tablet, Take 1 tablet by mouth daily.    Cholecalciferol (VITAMIN D) 125 MCG (5000 UT) CAPS, Take 5,000 Units by mouth daily in the afternoon.   ciclopirox (PENLAC) 8 % solution, Apply topically at bedtime. Apply over nail and surrounding skin. Apply daily over previous coat. After seven (7) days, may remove with alcohol and continue cycle.   diazepam (VALIUM) 5 MG tablet, Take 1 tablet (5 mg total) by mouth daily as needed for anxiety (MRI). Take 1 one hour prior to MRI and another when arrive   gabapentin (NEURONTIN) 100 MG capsule, Take 2 capsules (200 mg total) by mouth at bedtime.   Multiple Vitamin (MULTIVITAMIN WITH MINERALS) TABS tablet, Take 1 tablet by mouth daily.   pantoprazole (PROTONIX) 40 MG tablet, TAKE 1 TABLET BY MOUTH DAILY   urea (CARMOL) 10 % cream, Apply topically as needed.   Reviewed prior external information including notes and imaging from  primary care provider As well as notes that were available from care everywhere and other healthcare systems.  Past medical history, social, surgical and family history all reviewed in electronic medical record.  No pertanent information unless stated regarding to the chief complaint.   Review of Systems:  No headache, visual changes, nausea, vomiting, diarrhea, constipation, dizziness, abdominal pain, skin rash, fevers, chills, night sweats, weight loss, swollen lymph nodes, body aches, joint swelling, chest pain, shortness of breath, mood changes. POSITIVE muscle aches  Objective  Blood pressure 122/62, height 5\' 5"  (1.651 m), weight 196 lb (88.9 kg), SpO2 98%.   General: No apparent distress alert and oriented x3 mood and affect normal, dressed appropriately.  HEENT: Pupils equal, extraocular movements intact  Respiratory: Patient's speak in full sentences and does not appear short of breath  Cardiovascular: No lower extremity edema, non tender, no erythema  Foot exam shows patient is tender to palpation a little bit over the midfoot.  Does have a callus formation  over the big toe on the medial aspect of the first toe.  Breakdown of the transverse arch noted.  Rigid  midfoot noted. Onychomycosis noted.   Impression and Recommendations:    The above documentation has been reviewed and is accurate and complete Judi Saa, DO

## 2023-11-05 ENCOUNTER — Ambulatory Visit (INDEPENDENT_AMBULATORY_CARE_PROVIDER_SITE_OTHER): Payer: 59 | Admitting: Family Medicine

## 2023-11-05 ENCOUNTER — Encounter: Payer: Self-pay | Admitting: Family Medicine

## 2023-11-05 ENCOUNTER — Telehealth: Payer: Self-pay | Admitting: *Deleted

## 2023-11-05 ENCOUNTER — Other Ambulatory Visit: Payer: Self-pay

## 2023-11-05 VITALS — BP 122/62 | Ht 65.0 in | Wt 196.0 lb

## 2023-11-05 DIAGNOSIS — M79672 Pain in left foot: Secondary | ICD-10-CM | POA: Diagnosis not present

## 2023-11-05 DIAGNOSIS — B351 Tinea unguium: Secondary | ICD-10-CM

## 2023-11-05 DIAGNOSIS — M25571 Pain in right ankle and joints of right foot: Secondary | ICD-10-CM

## 2023-11-05 NOTE — Assessment & Plan Note (Signed)
Discussed over-the-counter medications.  Would like to avoid oral medications

## 2023-11-05 NOTE — Patient Instructions (Addendum)
Wart remover cream daily with bandaid Eucerin cream for dry skin Lamisil daily on toe nails See me in 3-5 months

## 2023-11-05 NOTE — Telephone Encounter (Signed)
A message was left for [name] to call and schedule her lipid appointment.

## 2023-11-05 NOTE — Assessment & Plan Note (Signed)
At the moment no significant swelling but still seems to be giving some difficulty.  Does have known arthritic changes that we need to continue to monitor as well.  Patient has not been wearing the sandals as regularly but will try to do that.  We did discuss different medications with at the moment we will hold on that.  Patient was more concerned with the dry skin than truly anything else.  We discussed and recommended over-the-counter stuff.  Does have some onychomycosis noted.

## 2023-11-07 ENCOUNTER — Ambulatory Visit: Payer: 59 | Attending: Family Medicine

## 2023-11-12 ENCOUNTER — Ambulatory Visit (HOSPITAL_BASED_OUTPATIENT_CLINIC_OR_DEPARTMENT_OTHER)
Admission: RE | Admit: 2023-11-12 | Discharge: 2023-11-12 | Disposition: A | Discharge status: Home / Self Care | Payer: 59 | Source: Ambulatory Visit | Attending: Family Medicine | Admitting: Family Medicine

## 2023-11-12 ENCOUNTER — Encounter (HOSPITAL_BASED_OUTPATIENT_CLINIC_OR_DEPARTMENT_OTHER): Payer: Self-pay

## 2023-11-12 ENCOUNTER — Other Ambulatory Visit: Payer: Self-pay

## 2023-11-12 DIAGNOSIS — Z1231 Encounter for screening mammogram for malignant neoplasm of breast: Secondary | ICD-10-CM | POA: Diagnosis not present

## 2023-11-12 DIAGNOSIS — Z1239 Encounter for other screening for malignant neoplasm of breast: Secondary | ICD-10-CM | POA: Insufficient documentation

## 2023-11-12 MED ORDER — BISOPROLOL FUMARATE 5 MG PO TABS: 2.5000 mg | ORAL_TABLET | Freq: Every day | ORAL | 1 refills | Status: DC

## 2023-11-14 ENCOUNTER — Encounter: Payer: Self-pay | Admitting: Nurse Practitioner

## 2023-11-14 ENCOUNTER — Ambulatory Visit: Payer: 59 | Admitting: Nurse Practitioner

## 2023-11-14 VITALS — BP 126/62 | HR 59 | Ht 65.0 in | Wt 196.4 lb

## 2023-11-14 DIAGNOSIS — Z7901 Long term (current) use of anticoagulants: Secondary | ICD-10-CM

## 2023-11-14 DIAGNOSIS — Z860100 Personal history of colon polyps, unspecified: Secondary | ICD-10-CM

## 2023-11-14 DIAGNOSIS — K649 Unspecified hemorrhoids: Secondary | ICD-10-CM

## 2023-11-14 MED ORDER — HYDROCORTISONE (PERIANAL) 2.5 % EX CREA: 1.0000 | TOPICAL_CREAM | Freq: Two times a day (BID) | CUTANEOUS | 1 refills | Status: AC

## 2023-11-14 MED ORDER — NA SULFATE-K SULFATE-MG SULF 17.5-3.13-1.6 GM/177ML PO SOLN: 1.0000 | Freq: Once | ORAL | 0 refills | Status: AC

## 2023-11-14 NOTE — Patient Instructions (Signed)
You have been scheduled for a colonoscopy. Please follow written instructions given to you at your visit today.   Please pick up your prep supplies at the pharmacy within the next 1-3 days.  If you use inhalers (even only as needed), please bring them with you on the day of your procedure.  DO NOT TAKE 7 DAYS PRIOR TO TEST- Trulicity (dulaglutide) Ozempic, Wegovy (semaglutide) Mounjaro (tirzepatide) Bydureon Bcise (exanatide extended release)  DO NOT TAKE 1 DAY PRIOR TO YOUR TEST Rybelsus (semaglutide) Adlyxin (lixisenatide) Victoza (liraglutide) Byetta (exanatide)  We have sent the following medications to your pharmacy for you to pick up at your convenience: Anusol Cream at bedtime for seven days.  _______________________________________________________  If your blood pressure at your visit was 140/90 or greater, please contact your primary care physician to follow up on this.  _______________________________________________________  If you are age 53 or older, your body mass index should be between 23-30. Your Body mass index is 32.68 kg/m. If this is out of the aforementioned range listed, please consider follow up with your Primary Care Provider.  If you are age 55 or younger, your body mass index should be between 19-25. Your Body mass index is 32.68 kg/m. If this is out of the aformentioned range listed, please consider follow up with your Primary Care Provider.   ________________________________________________________  The [facility] providers would like to encourage you to use [facility] to communicate with providers for non-urgent requests or questions.  Due to long hold times on the telephone, sending your provider a message by [facility] may be a faster and more efficient way to get a response.  Please allow 48 business hours for a response.  Please remember that this is for non-urgent requests.   Willette Cluster , NP

## 2023-11-14 NOTE — Progress Notes (Signed)
Brief Narrative 77 y.o. yo female with a past medical history not limited to adenomatous colon polyps, [facility] of colon cancer, GERD,  CAD s/p NSTEMI 10/2011 with DESx2, PAD, HTN, HLD, vertigo, acquired hypothyroidism   ASSESSMENT    History of adenomatous colon polyps.  Multiple tubular adenomas removed at time of last surveillance colonoscopy in March 2021. Due for 3 year recall colonoscopy   Chronic GERD with LA Grade B esophagitis. No reflux symptoms on PPI therapy.   CAD / remote DES x2. On Plavix.   Chest pain, resolved.  Two visits to ED in late October for chest pain. Troponins, EKG, chest CTA all non-diagnostic. Has since had follow up with Cardiologist. Pain not felt to have been angina / ischemic. No further pain  Hypertension.   Recent BP medication following ED visit for chest pain and elevated BP. Today BP 126/62  See PMH for any additional medical history   PLAN    --Patient's primary GI is retiring so will schedule surveillance colonoscopy to be done in mid January 2025 with Dr. Tomasa Rand. The risks and benefits of colonoscopy with possible polypectomy / biopsies were discussed and the patient agrees to proceed.  --Hold Plavix for 5 days before procedure - will instruct when and how to resume after procedure. Patient understands that there is a low but real risk of cardiovascular event such as heart attack, stroke, or embolism /  thrombosis, or ischemia while off Plavix. The patient consents to proceed. Will communicate by phone or EMR with patient's prescribing provider to confirm that holding Plavix is reasonable in this case.  --Continue daily Pantoprazole before breakfast for chronic GERD --Trial of Anusol cream PR nightly x 7 days  HPI   Chief complaint :  Time for surveillance colonoscopy   [name] is followed here for history of colon polyps and GERD.  Her 3-year surveillance colonoscopy is due in March 2025 .  She has no bowel changes, blood in stool or  abdominal pain .  She does request something for hemorrhoids  Patient was seen in ED on 10/16/23 with pressure like chest pain. BP was very high.  CXR negative. CBC, LFTs normal.  EKG showed a right bundle branch block, similar when compared to priors. Troponins negative x 2. Pain resolved with nitroglycerin.  [name] went back to ED again on 10/29 with sharp chest pain  (different than prior ED visit). Labs unrevealing including Troponin negative x 2. CTA negative for PE or any other acute findings and esophagus was unremarkable. Discharged home.   [name] had an office follow up with Cardiologist on 11/1 and they  were not convinced recent chest pain represented angina / ischemia. BP meds were changed. No chest pain since ED visit  Recent ED visit labs CBC normal - Hgb 10.4 Cr 1.09 LFTs normal.    GI History / Studies   **May not be a complete list of studies  Polyp surveillance colonoscopy March 2021 --- Three 7 to 8 mm polyps in the descending colon, in the ascending colon and in the cecum, removed with a cold snare. Resected and retrieved. - One 4 mm polyp in the transverse colon, removed with a cold biopsy forceps. Resected and retrieved. - Mild diverticulosis in the right colon. - External hemorrhoids. - The examination was otherwise normal on direct and retroflexion views. Surgical [P], colon, cecum, ascending, transverse, and descending, polyp (4) - TUBULAR ADENOMA (4 OF 5 FRAGMENTS) - BENIGN COLONIC MUCOSA (1 OF 5 FRAGMENTS) -  NO HIGH GRADE DYSPLASIA OR MALIGNANCY IDENTIFIED  EGD April 2027 for chest pain - LA Grade B reflux esophagitis. - Otherwise normal EGD.   Labs      Latest Ref Rng & Units 10/22/2023    7:08 PM 10/16/2023   11:26 PM 10/15/2023    9:27 AM  CBC  WBC 4.0 - 10.5 K/uL 10.4  9.6  7.8   Hemoglobin 12.0 - 15.0 g/dL 16.1  09.6  04.5   Hematocrit 36.0 - 46.0 % 41.8  42.6  43.9   Platelets 150 - 400 K/uL 277  288  282       Latest Ref Rng & Units  10/22/2023    7:08 PM 10/16/2023   11:26 PM 10/15/2023    9:27 AM  CMP  Glucose 70 - 99 mg/dL 409  811  86   BUN 8 - 23 mg/dL 21  17  20    Creatinine 0.44 - 1.00 mg/dL 9.14  7.82  9.56   Sodium 135 - 145 mmol/L 137  141  144   Potassium 3.5 - 5.1 mmol/L 3.9  3.9  4.8   Chloride 98 - 111 mmol/L 100  99  103   CO2 22 - 32 mmol/L 25  28  27    Calcium 8.9 - 10.3 mg/dL 7.6  8.9  9.1   Total Protein 6.0 - 8.5 g/dL   7.1   Total Bilirubin 0.0 - 1.2 mg/dL   0.3   Alkaline Phos 44 - 121 IU/L   105   AST 0 - 40 IU/L   15   ALT 0 - 32 IU/L   11    Echo   Feb 2024    1. Left ventricular ejection fraction, by estimation, is 60 to 65%. The  left ventricle has normal function. The left ventricle has no regional  wall motion abnormalities. Left ventricular diastolic parameters are  consistent with Grade I diastolic  dysfunction (impaired relaxation).   2. Right ventricular systolic function is normal. The right ventricular  size is normal. There is normal pulmonary artery systolic pressure. The  estimated right ventricular systolic pressure is 24.5 mmHg.   3. The mitral valve is normal in structure. No evidence of mitral valve  regurgitation. No evidence of mitral stenosis.   4. The aortic valve is tricuspid. Aortic valve regurgitation is trivial.  Aortic valve sclerosis is present, with no evidence of aortic valve  stenosis. Aortic valve area, by VTI measures 2.64 cm. Aortic valve mean  gradient measures 4.0 mmHg. Aortic  valve Vmax measures 1.33 m/s.   5. The inferior vena cava is dilated in size with >50% respiratory  variability, suggesting right atrial pressure of 8 mmHg.   Conclusion(s)/Recommendation(s): No intracardiac source of embolism  detected on this transthoracic study. Consider a transesophageal  echocardiogram to exclude cardiac source of embolism if clinically  indicated.     Past Medical History:  Diagnosis Date   Acute MI, subendocardial (HCC)    Arthritis     Back pain 05/31/2013   Benign paroxysmal positional vertigo 08/05/2016   CAD (coronary artery disease)    a. NSTEMI 10/2011: LHC 11/19/11: pLAD 30%, oOM 60%, AVCFX 30%, CFX after OM2 30%, pRCA 60 and 70%, then 99%, AM 80-90% with TIMI 3 flow.  PCI: Promus DES x 2 to RCA; b. 06/2012 Cath: patent RCA stents w/ subtl occl of Acute Marginal (jailed)->Med rx; c. 05/2015 MV: EF 59%, mod mid infsept/inf/ap lat/ap infarct with peri-infarct isch-->Med Rx; d.  02/2016 Cath: LM nl, LAD 39m, RI 50, RCA patent stents.   Colitis    Facial skin lesion 01/28/2017   GERD (gastroesophageal reflux disease)    occasional   Hyperlipidemia    Hypertension    Hypertensive heart disease    a. Echocardiogram 11/19/11: Difficult acoustic windows, EF 60-65%, normal LV wall thickness, grade 1 diastolic dysfunction   Hypoparathyroidism (HCC)    Low zinc level 11/26/2016   Multinodular thyroid    Goiter s/p thyroidectomy in 2007 with post-op  hypocalcemia and post-op hypothyroidism/hypoparathyroidism with hypocalcemia   Neck pain on right side 07/13/2013   Nocturia 05/31/2013   Osteoarthritis    Pain in right axilla 03/13/2016   Palpitations    a. 03/2012 - patient set up for event monitor but did not wear correctly -  she declined wearing a repeat monitor   Post-surgical hypothyroidism    Sun-damaged skin 12/05/2014   Tubular adenoma of colon 06/2011   Unspecified constipation 05/31/2013   Vertigo    Zinc deficiency 11/26/2015   Past Surgical History:  Procedure Laterality Date   CARDIAC CATHETERIZATION N/A 03/08/2016   Procedure: Left Heart Cath and Coronary Angiography;  Surgeon: Corky Crafts, MD;  Location: [facility] INVASIVE CV LAB;  Service: Cardiovascular;  Laterality: N/A;   COLONOSCOPY  Aenomatous polyps   07/05/2011   COLONOSCOPY N/A 09/28/2014   Procedure: COLONOSCOPY;  Surgeon: Meryl Dare, MD;  Location: [facility] ENDOSCOPY;  Service: Endoscopy;  Laterality: N/A;   CORONARY ANGIOPLASTY WITH STENT PLACEMENT     LEFT  HEART CATH AND CORONARY ANGIOGRAPHY N/A 12/09/2018   Procedure: LEFT HEART CATH AND CORONARY ANGIOGRAPHY;  Surgeon: Corky Crafts, MD;  Location: [facility] INVASIVE CV LAB;  Service: Cardiovascular;  Laterality: N/A;   LEFT HEART CATHETERIZATION WITH CORONARY ANGIOGRAM N/A 07/17/2012   Procedure: LEFT HEART CATHETERIZATION WITH CORONARY ANGIOGRAM;  Surgeon: Herby Abraham, MD;  Location: [facility] CATH LAB;  Service: Cardiovascular;  Laterality: N/A;   PERCUTANEOUS CORONARY STENT INTERVENTION (PCI-S) N/A 11/19/2011   Procedure: PERCUTANEOUS CORONARY STENT INTERVENTION (PCI-S);  Surgeon: Herby Abraham, MD;  Location: [facility] CATH LAB;  Service: Cardiovascular;  Laterality: N/A;   POLYPECTOMY     SHOULDER ARTHROSCOPY W/ ROTATOR CUFF REPAIR     right   TOTAL THYROIDECTOMY  2007   GOITER   Family History  Problem Relation Age of Onset   Colon cancer Brother    Cancer Brother        COLON   Hypertension Father    Heart disease Father    Heart attack Father    Blindness Sister    Congestive Heart Failure Sister    Hypertension Sister    Healthy Daughter    Healthy Son    Thyroid disease Sister    Cancer Brother        multiple myelomas   Healthy Daughter    Healthy Daughter    Diabetes Neg Hx    Prostate cancer Neg Hx    Breast cancer Neg Hx    Esophageal cancer Neg Hx    Rectal cancer Neg Hx    Stomach cancer Neg Hx    Social History   Tobacco Use   Smoking status: Never   Smokeless tobacco: Never  Vaping Use   Vaping status: Never Used  Substance Use Topics   Alcohol use: Not Currently   Drug use: No   Current Outpatient Medications  Medication Sig Dispense Refill   amLODipine (NORVASC) 10 MG tablet Take 1  tablet (10 mg total) by mouth daily. 90 tablet 2   aspirin EC 81 MG tablet Take 81 mg by mouth daily.     B Complex-C (B-COMPLEX WITH VITAMIN C) tablet Take 1 tablet by mouth daily.     bisoprolol (ZEBETA) 5 MG tablet Take 0.5 tablets (2.5 mg total) by mouth daily. 45  tablet 1   calcitRIOL (ROCALTROL) 0.25 MCG capsule TAKE 3 CAPSULES BY MOUTH DAILY 300 capsule 3   Cholecalciferol (VITAMIN D) 125 MCG (5000 UT) CAPS Take 5,000 Units by mouth daily in the afternoon. 100 capsule 3   ciclopirox (PENLAC) 8 % solution Apply topically at bedtime. Apply over nail and surrounding skin. Apply daily over previous coat. After seven (7) days, may remove with alcohol and continue cycle. 6.6 mL 0   clopidogrel (PLAVIX) 75 MG tablet Take 1 tablet (75 mg total) by mouth daily. 90 tablet 3   diazepam (VALIUM) 5 MG tablet Take 1 tablet (5 mg total) by mouth daily as needed for anxiety (MRI). Take 1 one hour prior to MRI and another when arrive 10 tablet 0   furosemide (LASIX) 20 MG tablet TAKE 1 TABLET BY MOUTH ONCE  WEEKLY AS NEEDED 15 tablet 3   gabapentin (NEURONTIN) 100 MG capsule Take 2 capsules (200 mg total) by mouth at bedtime. 180 capsule 0   hydrALAZINE (APRESOLINE) 10 MG tablet TAKE 1/2 TABLET (5 MG) BY MOUTH AS NEEDED FOR BP ABOVE 160 OR GREATER 30 tablet 6   isosorbide mononitrate (IMDUR) 60 MG 24 hr tablet TAKE 1 TABLET BY MOUTH DAILY 90 tablet 3   levothyroxine (SYNTHROID) 112 MCG tablet Take 1 tablet (112 mcg total) by mouth 3 (three) times a week. 90 tablet 0   Multiple Vitamin (MULTIVITAMIN WITH MINERALS) TABS tablet Take 1 tablet by mouth daily.     nitroGLYCERIN (NITROSTAT) 0.4 MG SL tablet DISSOLVE 1 TABLET UNDER THE  TONGUE EVERY 5 MINUTES AS NEEDED FOR CHEST PAIN. MAX OF 3 TABLETS IN 15 MINUTES. CALL 911 IF PAIN  PERSISTS. 100 tablet 3   pantoprazole (PROTONIX) 40 MG tablet TAKE 1 TABLET BY MOUTH DAILY 90 tablet 3   REPATHA SURECLICK 140 MG/ML SOAJ INJECT 140MG  SUBCUTANEOUSLY  EVERY 2 WEEKS 6 mL 3   rosuvastatin (CRESTOR) 40 MG tablet Take 1 tablet (40 mg total) by mouth daily. 90 tablet 3   urea (CARMOL) 10 % cream Apply topically as needed. 71 g 0   No current facility-administered medications for this visit.   Allergies  Allergen Reactions   Zetia  [Ezetimibe] Other (See Comments)    Myalgia, paresthesias and weakness   Penicillin G Rash    Review of Systems: No SHOB, no palpitations, no unexplained weight loss.   Wt Readings from Last 3 Encounters:  11/05/23 196 lb (88.9 kg)  10/25/23 195 lb (88.5 kg)  10/22/23 190 lb 0.6 oz (86.2 kg)    Physical Exam:  BP 126/62   Pulse (!) 59   Ht 5\' 5"  (1.651 m)   Wt 196 lb 6 oz (89.1 kg)   SpO2 96%   BMI 32.68 kg/m  Constitutional:  Pleasant, generally well appearing female in no acute distress. Psychiatric:  Normal mood and affect. Behavior is normal. EENT: Pupils normal.  Conjunctivae are normal. No scleral icterus. Neck supple.  Cardiovascular: Normal rate, regular rhythm.  Pulmonary/chest: Effort normal and breath sounds normal. No wheezing, rales or rhonchi. Abdominal: Soft, nondistended, nontender. Bowel sounds active throughout. There are no masses palpable. No  hepatomegaly. Neurological: Alert and oriented to person place and time.   Willette Cluster, NP  11/14/2023, 1:31 PM

## 2023-11-26 ENCOUNTER — Telehealth: Payer: Self-pay | Admitting: Nurse Practitioner

## 2023-11-26 ENCOUNTER — Ambulatory Visit: Payer: 59 | Admitting: Family

## 2023-11-26 NOTE — Telephone Encounter (Signed)
Patient called and stated that she wants a different medication to take for her prep. Patient stated that she would like something with taste. Also patient is requesting a call back. Please advise.

## 2023-11-26 NOTE — Telephone Encounter (Signed)
Called patient at patient's request, no answer, [email or web address]

## 2023-11-27 NOTE — Telephone Encounter (Signed)
Called and left message for patient to call back. Second attempt at trying to contact patient unsuccessfully.

## 2023-11-28 ENCOUNTER — Ambulatory Visit: Payer: 59 | Admitting: Family Medicine

## 2023-12-10 ENCOUNTER — Ambulatory Visit (INDEPENDENT_AMBULATORY_CARE_PROVIDER_SITE_OTHER): Payer: 59 | Admitting: Family

## 2023-12-10 VITALS — BP 122/62 | HR 57 | Temp 98.0°F | Resp 16 | Ht 65.0 in | Wt 195.0 lb

## 2023-12-10 DIAGNOSIS — F320 Major depressive disorder, single episode, mild: Secondary | ICD-10-CM | POA: Diagnosis not present

## 2023-12-10 DIAGNOSIS — K2100 Gastro-esophageal reflux disease with esophagitis, without bleeding: Secondary | ICD-10-CM | POA: Diagnosis not present

## 2023-12-10 DIAGNOSIS — E039 Hypothyroidism, unspecified: Secondary | ICD-10-CM

## 2023-12-10 DIAGNOSIS — E876 Hypokalemia: Secondary | ICD-10-CM

## 2023-12-10 DIAGNOSIS — K5909 Other constipation: Secondary | ICD-10-CM | POA: Diagnosis not present

## 2023-12-10 DIAGNOSIS — Z780 Asymptomatic menopausal state: Secondary | ICD-10-CM

## 2023-12-10 DIAGNOSIS — G629 Polyneuropathy, unspecified: Secondary | ICD-10-CM | POA: Diagnosis not present

## 2023-12-10 DIAGNOSIS — E782 Mixed hyperlipidemia: Secondary | ICD-10-CM

## 2023-12-10 DIAGNOSIS — I10 Essential (primary) hypertension: Secondary | ICD-10-CM | POA: Diagnosis not present

## 2023-12-10 MED ORDER — LEVOTHYROXINE SODIUM 112 MCG PO TABS: 112.0000 ug | ORAL_TABLET | ORAL | 1 refills | Status: DC

## 2023-12-10 MED ORDER — ROSUVASTATIN CALCIUM 40 MG PO TABS: 40.0000 mg | ORAL_TABLET | Freq: Every day | ORAL | 1 refills | Status: DC

## 2023-12-10 NOTE — Assessment & Plan Note (Signed)
Working on dietary modification, continues pantoprazole. Overall stable.

## 2023-12-10 NOTE — Patient Instructions (Signed)
VISIT SUMMARY:  During your visit today, we reviewed your ongoing health conditions, including hypertension, hyperlipidemia, neuropathy, GERD, and chronic constipation. We discussed your current medications and management strategies, and made some adjustments to better address your symptoms. We also talked about general health maintenance and upcoming tests.  YOUR PLAN:  -HYPERTENSION: Hypertension, or high blood pressure, is well controlled with your current medications, Amlodipine 10mg  daily and Hydralazine as needed. Continue with this regimen.  -GASTROESOPHAGEAL REFLUX DISEASE (GERD): GERD is a condition where stomach acid frequently flows back into the tube connecting your mouth and stomach. Your symptoms are managed with dietary changes and over-the-counter medications. Continue with your current management.  -CONSTIPATION: Constipation is difficulty in passing stools. Despite dietary changes and using Senna, you still have issues. We discussed trying Miralax to help with this.  -HYPERLIPIDEMIA: Hyperlipidemia is having high levels of fats in your blood. You are on Repatha and Rosuvastatin, but your LDL is still above the target. Continue with your current medications and focus on diet and exercise.  -PERIPHERAL NEUROPATHY: Peripheral neuropathy is a result of damage to the nerves outside of the brain and spinal cord. You have stopped Gabapentin due to side effects and are managing the pain with patches. Continue with your current management.  -CHRONIC KIDNEY DISEASE: Chronic kidney disease is a long-term condition where the kidneys do not work as well as they should. Your creatinine levels are slightly elevated, likely due to hypertension. We will monitor your kidney function with labs. Avoid NSAIDs, stay hydrated, and keep your blood pressure controlled.  -GENERAL HEALTH MAINTENANCE: We will order a bone density test as it is due. You have declined the COVID-19 booster. Plan to follow up in  6 months.  INSTRUCTIONS:  Please follow up in 6 months. We will also monitor your kidney function with labs and order a bone density test.

## 2023-12-10 NOTE — Assessment & Plan Note (Signed)
Some pain but she is trying to manage without gabapentin as it made her dizzy.

## 2023-12-10 NOTE — Assessment & Plan Note (Signed)
At goal on amlodipine. Continue same.

## 2023-12-10 NOTE — Progress Notes (Signed)
Subjective:     Patient ID: [name], female    DOB: 1946-10-17, 77 y.o.   MRN: [id number]  Chief Complaint  Patient presents with   Hypertension    Here for follow up    HPI  Discussed the use of AI scribe software for clinical note transcription with the patient, who gave verbal consent to proceed.  History of Present Illness   The patient, with a history of hypertension, hyperlipidemia, and neuropathy, presents for a routine follow up. She reports her blood pressure has been well controlled on amlodipine 10mg  daily and hydralazine 5mg  as needed for high readings. She has experienced heartburn, which she manages with over-the-counter medications and dietary modifications, avoiding trigger foods such as cheese and chocolate. She denies chest pain and shortness of breath, and continues to follow up with cardiology.  The patient also reports chronic constipation, despite a diet rich in fruits and vegetables. She has tried Senna and Smooth Move tea with variable success.  She has stopped taking gabapentin for neuropathy due to dizziness, and is managing her pain with patches and other non-pharmacological methods. She also reports occasional use of diazepam for MRI due to claustrophobia.  The patient is on Repatha injections and rosuvastatin for hyperlipidemia, but expresses concern about the amount of medication. She has not received the COVID-19 booster shot due to mistrust of pharmaceutical companies.       Health Maintenance Due  Topic Date Due   Zoster Vaccines- Shingrix (1 of 2) Never done   Cervical Cancer Screening (Pap smear)  12/28/2021   DEXA SCAN  02/06/2023   Colonoscopy  03/03/2023   Medicare Annual Wellness (AWV)  05/08/2023    Past Medical History:  Diagnosis Date   Acute MI, subendocardial (HCC)    Arthritis    Back pain 05/31/2013   Benign paroxysmal positional vertigo 08/05/2016   CAD (coronary artery disease)    a. NSTEMI 10/2011: LHC 11/19/11: pLAD  30%, oOM 60%, AVCFX 30%, CFX after OM2 30%, pRCA 60 and 70%, then 99%, AM 80-90% with TIMI 3 flow.  PCI: Promus DES x 2 to RCA; b. 06/2012 Cath: patent RCA stents w/ subtl occl of Acute Marginal (jailed)->Med rx; c. 05/2015 MV: EF 59%, mod mid infsept/inf/ap lat/ap infarct with peri-infarct isch-->Med Rx; d. 02/2016 Cath: LM nl, LAD 45m, RI 50, RCA patent stents.   Colitis    Facial skin lesion 01/28/2017   GERD (gastroesophageal reflux disease)    occasional   Hyperlipidemia    Hypertension    Hypertensive heart disease    a. Echocardiogram 11/19/11: Difficult acoustic windows, EF 60-65%, normal LV wall thickness, grade 1 diastolic dysfunction   Hypoparathyroidism (HCC)    Low zinc level 11/26/2016   Multinodular thyroid    Goiter s/p thyroidectomy in 2007 with post-op  hypocalcemia and post-op hypothyroidism/hypoparathyroidism with hypocalcemia   Neck pain on right side 07/13/2013   Nocturia 05/31/2013   Osteoarthritis    Pain in right axilla 03/13/2016   Palpitations    a. 03/2012 - patient set up for event monitor but did not wear correctly -  she declined wearing a repeat monitor   Post-surgical hypothyroidism    Sun-damaged skin 12/05/2014   Tubular adenoma of colon 06/2011   Unspecified constipation 05/31/2013   Vertigo    Zinc deficiency 11/26/2015    Past Surgical History:  Procedure Laterality Date   CARDIAC CATHETERIZATION N/A 03/08/2016   Procedure: Left Heart Cath and Coronary Angiography;  Surgeon: Renelda Loma  Hoyle Barr, MD;  Location: [facility] INVASIVE CV LAB;  Service: Cardiovascular;  Laterality: N/A;   COLONOSCOPY  Aenomatous polyps   07/05/2011   COLONOSCOPY N/A 09/28/2014   Procedure: COLONOSCOPY;  Surgeon: Meryl Dare, MD;  Location: [facility] ENDOSCOPY;  Service: Endoscopy;  Laterality: N/A;   CORONARY ANGIOPLASTY WITH STENT PLACEMENT     LEFT HEART CATH AND CORONARY ANGIOGRAPHY N/A 12/09/2018   Procedure: LEFT HEART CATH AND CORONARY ANGIOGRAPHY;  Surgeon: Corky Crafts, MD;   Location: [facility] INVAS[facility] CV LAB;  Service: Cardiovascular;  Laterality: N/A;   LEFT HEART CATHETERIZATION WITH CORONARY ANGIOGRAM N/A 07/17/2012   Procedure: LEFT HEART CATHETERIZATION WITH CORONARY ANGIOGRAM;  Surgeon: Herby Abraham, MD;  Location: [facility] CATH LAB;  Service: Cardiovascular;  Laterality: N/A;   PERCUTANEOUS CORONARY STENT INTERVENTION (PCI-S) N/A 11/19/2011   Procedure: PERCUTANEOUS CORONARY STENT INTERVENTION (PCI-S);  Surgeon: Herby Abraham, MD;  Location: [facility] CATH LAB;  Service: Cardiovascular;  Laterality: N/A;   POLYPECTOMY     SHOULDER ARTHROSCOPY W/ ROTATOR CUFF REPAIR     right   TOTAL THYROIDECTOMY  2007   GOITER    Family History  Problem Relation Age of Onset   Colon cancer Brother    Cancer Brother        COLON   Hypertension Father    Heart disease Father    Heart attack Father    Blindness Sister    Congestive Heart Failure Sister    Hypertension Sister    Healthy Daughter    Healthy Son    Thyroid disease Sister    Cancer Brother        multiple myelomas   Healthy Daughter    Healthy Daughter    Diabetes Neg Hx    Prostate cancer Neg Hx    Breast cancer Neg Hx    Esophageal cancer Neg Hx    Rectal cancer Neg Hx    Stomach cancer Neg Hx     Social History   Socioeconomic History   Marital status: Married    Spouse name: Not on file   Number of children: 4   Years of education: 14   Highest education level: Not on file  Occupational History   Occupation: Sales person at [employer]  Tobacco Use   Smoking status: Never   Smokeless tobacco: Never  Vaping Use   Vaping status: Never Used  Substance and Sexual Activity   Alcohol use: Not Currently   Drug use: No   Sexual activity: Not Currently  Other Topics Concern   Not on file  Social History Narrative   Lives at home alone.  Her son lives near her.   Right-handed.   1 cup coffee per day.   Social Drivers of Corporate investment banker Strain: Low Risk  (05/07/2022)   Overall  Financial Resource Strain (CARDIA)    Difficulty of Paying Living Expenses: Not hard at all  Food Insecurity: No Food Insecurity (06/05/2023)   Hunger Vital Sign    Worried About Running Out of Food in the Last Year: Never true    Ran Out of Food in the Last Year: Never true  Transportation Needs: No Transportation Needs (06/05/2023)   PRAPARE - Administrator, Civil Service (Medical): No    Lack of Transportation (Non-Medical): No  Physical Activity: Sufficiently Active (05/07/2022)   Exercise Vital Sign    Days of Exercise per Week: 7 days    Minutes of Exercise per Session: 30  min  Stress: No Stress Concern Present (05/07/2022)   [employer] of Occupational Health - Occupational Stress Questionnaire    Feeling of Stress : Not at all  Social Connections: Unknown (08/22/2022)   [facility] from [facility]   Social Network    Social Network: Not on file  Intimate Partner Violence: Not At Risk (02/06/2023)   Humiliation, Afraid, Rape, and Kick questionnaire    Fear of Current or Ex-Partner: No    Emotionally Abused: No    Physically Abused: No    Sexually Abused: No    Outpatient Medications Prior to Visit  Medication Sig Dispense Refill   amLODipine (NORVASC) 10 MG tablet Take 1 tablet (10 mg total) by mouth daily. 90 tablet 2   aspirin EC 81 MG tablet Take 81 mg by mouth daily.     B Complex-C (B-COMPLEX WITH VITAMIN C) tablet Take 1 tablet by mouth daily.     bisoprolol (ZEBETA) 5 MG tablet Take 0.5 tablets (2.5 mg total) by mouth daily. 45 tablet 1   calcitRIOL (ROCALTROL) 0.25 MCG capsule TAKE 3 CAPSULES BY MOUTH DAILY 300 capsule 3   Cholecalciferol (VITAMIN D) 125 MCG (5000 UT) CAPS Take 5,000 Units by mouth daily in the afternoon. 100 capsule 3   ciclopirox (PENLAC) 8 % solution Apply topically at bedtime. Apply over nail and surrounding skin. Apply daily over previous coat. After seven (7) days, may remove with alcohol and continue cycle. 6.6 mL  0   clopidogrel (PLAVIX) 75 MG tablet Take 1 tablet (75 mg total) by mouth daily. 90 tablet 3   furosemide (LASIX) 20 MG tablet TAKE 1 TABLET BY MOUTH ONCE  WEEKLY AS NEEDED 15 tablet 3   hydrALAZINE (APRESOLINE) 10 MG tablet TAKE 1/2 TABLET (5 MG) BY MOUTH AS NEEDED FOR BP ABOVE 160 OR GREATER 30 tablet 6   hydrocortisone (ANUSOL-HC) 2.5 % rectal cream Place 1 Application rectally 2 (two) times daily. 30 g 1   isosorbide mononitrate (IMDUR) 60 MG 24 hr tablet TAKE 1 TABLET BY MOUTH DAILY 90 tablet 3   Multiple Vitamin (MULTIVITAMIN WITH MINERALS) TABS tablet Take 1 tablet by mouth daily.     nitroGLYCERIN (NITROSTAT) 0.4 MG SL tablet DISSOLVE 1 TABLET UNDER THE  TONGUE EVERY 5 MINUTES AS NEEDED FOR CHEST PAIN. MAX OF 3 TABLETS IN 15 MINUTES. CALL 911 IF PAIN  PERSISTS. 100 tablet 3   pantoprazole (PROTONIX) 40 MG tablet TAKE 1 TABLET BY MOUTH DAILY 90 tablet 3   REPATHA SURECLICK 140 MG/ML SOAJ INJECT 140MG  SUBCUTANEOUSLY  EVERY 2 WEEKS 6 mL 3   urea (CARMOL) 10 % cream Apply topically as needed. 71 g 0   diazepam (VALIUM) 5 MG tablet Take 1 tablet (5 mg total) by mouth daily as needed for anxiety (MRI). Take 1 one hour prior to MRI and another when arrive 10 tablet 0   gabapentin (NEURONTIN) 100 MG capsule Take 2 capsules (200 mg total) by mouth at bedtime. 180 capsule 0   levothyroxine (SYNTHROID) 112 MCG tablet Take 1 tablet (112 mcg total) by mouth 3 (three) times a week. 90 tablet 0   rosuvastatin (CRESTOR) 40 MG tablet Take 1 tablet (40 mg total) by mouth daily. 90 tablet 3   No facility-administered medications prior to visit.    Allergies  Allergen Reactions   Zetia [Ezetimibe] Other (See Comments)    Myalgia, paresthesias and weakness   Penicillin G Rash    ROS    See HPI  Objective:    Physical Exam Constitutional:      General: She is not in acute distress.    Appearance: Normal appearance. She is well-developed.  HENT:     Head: Normocephalic and atraumatic.     Right  Ear: External ear normal.     Left Ear: External ear normal.  Eyes:     General: No scleral icterus. Neck:     Thyroid: No thyromegaly.  Cardiovascular:     Rate and Rhythm: Normal rate and regular rhythm.     Heart sounds: Normal heart sounds. No murmur heard. Pulmonary:     Effort: Pulmonary effort is normal. No respiratory distress.     Breath sounds: Normal breath sounds. No wheezing.  Musculoskeletal:     Cervical back: Neck supple.  Skin:    General: Skin is warm and dry.  Neurological:     Mental Status: She is alert and oriented to person, place, and time.  Psychiatric:        Mood and Affect: Mood normal.        Behavior: Behavior normal.        Thought Content: Thought content normal.        Judgment: Judgment normal.      BP 122/62 (BP Location: Right Arm, Patient Position: Sitting, Cuff Size: Small)   Pulse (!) 57   Temp 98 F (36.7 C) (Oral)   Resp 16   Ht 5\' 5"  (1.651 m)   Wt 195 lb (88.5 kg)   SpO2 98%   BMI 32.45 kg/m  Wt Readings from Last 3 Encounters:  12/10/23 195 lb (88.5 kg)  11/14/23 196 lb 6 oz (89.1 kg)  11/05/23 196 lb (88.9 kg)       Assessment & Plan:   Problem List Items Addressed This Visit       Unprioritized   Neuropathy   Some pain but she is trying to manage without gabapentin as it made her dizzy.       Hypokalemia   Relevant Medications   rosuvastatin (CRESTOR) 40 MG tablet   Hyperlipidemia   Lab Results  Component Value Date   CHOL 200 02/05/2023   HDL 52 02/05/2023   LDLCALC 112 (H) 02/05/2023   LDLDIRECT 96.0 11/06/2022   TRIG 178 (H) 02/05/2023   CHOLHDL 3.8 02/05/2023         Relevant Medications   rosuvastatin (CRESTOR) 40 MG tablet   Other Relevant Orders   Comp Met (CMET)   Lipid panel   GERD (gastroesophageal reflux disease)   Working on dietary modification, continues pantoprazole. Overall stable.       Essential hypertension, benign - Primary   At goal on amlodipine. Continue same.        Relevant Medications   rosuvastatin (CRESTOR) 40 MG tablet   Depression   Reports stable mood. Not on medicaiton.       Chronic constipation   She is followed by GI and has colonoscopy.       Acquired hypothyroidism   Relevant Medications   rosuvastatin (CRESTOR) 40 MG tablet   levothyroxine (SYNTHROID) 112 MCG tablet (Start on 12/11/2023)   Other Visit Diagnoses       Postmenopausal estrogen deficiency       Relevant Orders   DG Bone Density     Hypothyroidism       Relevant Medications   rosuvastatin (CRESTOR) 40 MG tablet   levothyroxine (SYNTHROID) 112 MCG tablet (Start on 12/11/2023)   Other Relevant  Orders   TSH       I have discontinued Shenee T. Foley's diazepam and gabapentin. I am also having her maintain her aspirin EC, B-complex with vitamin C, multivitamin with minerals, urea, Vitamin D, Repatha SureClick, hydrALAZINE, ciclopirox, pantoprazole, furosemide, isosorbide mononitrate, clopidogrel, calcitRIOL, nitroGLYCERIN, amLODipine, bisoprolol, hydrocortisone, rosuvastatin, and levothyroxine.  Meds ordered this encounter  Medications   rosuvastatin (CRESTOR) 40 MG tablet    Sig: Take 1 tablet (40 mg total) by mouth daily.    Dispense:  90 tablet    Refill:  1    Please send a replace/new response with 100-Day Supply if appropriate to maximize member benefit. Requesting 1 year supply.    Supervising Provider:   Bradd Canary [[id number]]   levothyroxine (SYNTHROID) 112 MCG tablet    Sig: Take 1 tablet (112 mcg total) by mouth 3 (three) times a week.    Dispense:  90 tablet    Refill:  1    Supervising Provider:   Danise Edge A [[id number]]

## 2023-12-10 NOTE — Assessment & Plan Note (Signed)
Reports stable mood. Not on medicaiton.

## 2023-12-10 NOTE — Assessment & Plan Note (Signed)
She is followed by GI and has colonoscopy.

## 2023-12-10 NOTE — Assessment & Plan Note (Signed)
Lab Results  Component Value Date   CHOL 200 02/05/2023   HDL 52 02/05/2023   LDLCALC 112 (H) 02/05/2023   LDLDIRECT 96.0 11/06/2022   TRIG 178 (H) 02/05/2023   CHOLHDL 3.8 02/05/2023

## 2023-12-11 LAB — LIPID PANEL
Cholesterol: 198 mg/dL (ref 0–200)
HDL: 48.1 mg/dL (ref >39.00)
LDL Cholesterol: 115 mg/dL — ABNORMAL HIGH (ref 0–99)
NonHDL: 149.48
Total CHOL/HDL Ratio: 4
Triglycerides: 174 mg/dL — ABNORMAL HIGH (ref 0.0–149.0)
VLDL: 34.8 mg/dL (ref 0.0–40.0)

## 2023-12-11 LAB — COMPREHENSIVE METABOLIC PANEL
ALT: 16 U/L (ref 0–35)
AST: 17 U/L (ref 0–37)
Albumin: 3.9 g/dL (ref 3.5–5.2)
Alkaline Phosphatase: 90 U/L (ref 39–117)
BUN: 24 mg/dL — ABNORMAL HIGH (ref 6–23)
CO2: 29 meq/L (ref 19–32)
Calcium: 7.3 mg/dL — ABNORMAL LOW (ref 8.4–10.5)
Chloride: 103 meq/L (ref 96–112)
Creatinine, Ser: 1.08 mg/dL (ref 0.40–1.20)
GFR: 49.79 mL/min — ABNORMAL LOW (ref >60.00)
Glucose, Bld: 87 mg/dL (ref 70–99)
Potassium: 4.2 meq/L (ref 3.5–5.1)
Sodium: 145 meq/L (ref 135–145)
Total Bilirubin: 0.4 mg/dL (ref 0.2–1.2)
Total Protein: 7 g/dL (ref 6.0–8.3)

## 2023-12-11 LAB — TSH: TSH: 3.63 u[IU]/mL (ref 0.35–5.50)

## 2023-12-12 ENCOUNTER — Telehealth (HOSPITAL_BASED_OUTPATIENT_CLINIC_OR_DEPARTMENT_OTHER): Payer: Self-pay | Admitting: Family

## 2023-12-16 ENCOUNTER — Ambulatory Visit: Payer: 59 | Attending: Internal Medicine | Admitting: Internal Medicine

## 2023-12-17 ENCOUNTER — Encounter: Payer: Self-pay | Admitting: Internal Medicine

## 2023-12-19 ENCOUNTER — Encounter: Payer: Self-pay | Admitting: Student

## 2023-12-19 ENCOUNTER — Ambulatory Visit: Payer: 59 | Attending: Internal Medicine | Admitting: Student

## 2023-12-19 DIAGNOSIS — E785 Hyperlipidemia, unspecified: Secondary | ICD-10-CM

## 2023-12-19 MED ORDER — ROSUVASTATIN CALCIUM 20 MG PO TABS: 20.0000 mg | ORAL_TABLET | Freq: Every day | ORAL | 3 refills | Status: DC

## 2023-12-19 MED ORDER — NEXLETOL 180 MG PO TABS: 1.0000 | ORAL_TABLET | Freq: Every day | ORAL | 11 refills | Status: DC

## 2023-12-19 NOTE — Patient Instructions (Addendum)
Your Results:             Your most recent labs Goal  Total Cholesterol 198 < 200  Triglycerides 174 < 150  HDL (happy/good cholesterol) 48.10 > 40  LDL (lousy/bad cholesterol 115 <70   Medication changes: Start taking Crestor 20 mg daily and nexletol 180 mg daily  Continue taking  Repatha as prescribed

## 2023-12-19 NOTE — Assessment & Plan Note (Signed)
Assessment:  LDL goal: < 70 mg/dl TG gaol <865 ng/dl last LDLc 784 mg/dl TG 696 and (29/52/8413) Tolerates Repatha and Crestor 40 mg twice a week   Intolerance to high intensity statins - Crestor 40 mg daily and Zetia 10 mg daily  Discussed next potential option-, bempedoic acid; cost, dosing efficacy, side effects  Follows heart healthy diet but chocolates are her weakness [place] 30 min three times per week   Plan: Reduce Crestor dose from 40 mg to 20 mg daily if not tolerated can cut down to 10 mg daily continue taking Repatha 140 mg [place] Q 14 D Start taking Nexletol 180 mg daily - patient made aware Kennedy Bucker will cover the co-pay  Uric acid lab today and f/u lab - lipid and uric acid March 17,2025 Cut down on sweets and exercise 4-5 days per week will help to lower TG

## 2023-12-19 NOTE — Progress Notes (Signed)
Patient ID: [name]                 DOB: 02-13-1946                    MRN: [id number]      HPI: [name] is a 77 y.o. female patient referred to lipid clinic by Dr.Ross. PMH is significant for TIA, PAD, CAD, s/p MI, HTN, GERD, constipation  Patient presented today for Lipid clinic. Has been taking Repatha every 14 day regularly but due to myalgia she only been taking Crestor 40 mg twice a week. She never tried taking 20 mg every day or 20 mg every other day. [facility] and she eats fairly healthy diet. Her weakness is chocolates and she has been eating a lot of  them lately. Once holiday season will be over she will cut down on eating sweets. And ready to increase her exercise days from 3 times per week to 4-5 times per week. She could not tolerate Zetia due to myalgia and not ready to re-try it. We discussed bempedoic mechanisms of action, dosing, side effects and potential decreases in LDL cholesterol.  Currently on the grant so cost will not be an issue  Current Medications: Repatha 140 mg SQ Q14D, Crestor 40 mg twice a week  Intolerances: Zetia 10 mg- Myalgia, paresthesias and weakness  Risk Factors: TIA, PAD, CAD, s/p MI, HTN LDL goal: <70 mg/dl TG <629 mg/dl  Last lab: TC 528 mg/dl, TG 413 mg/dl, HDL 24.40 LDLc 102   Diet: low salt and low fat diet but her weakness is chocolate  Eats fish twice a week  Exercise: walks at [facility] - treadmill 20 min 3 times per week   Family History:  Relation Problem Comments  Mother (Deceased)   Father (Deceased) Heart attack   Heart disease   Hypertension     Sister - 54 (Alive) Blindness   Congestive Heart Failure   Hypertension     Sister - 62 Metallurgist)   Sister - 47 Metallurgist)   Sister - 42 (Alive)   Sister - 71 (Alive)   Sister - 75 (Alive) Thyroid disease     Brother - 41 (Deceased) Cancer COLON  Colon cancer     Brother - 45 (Deceased) Cancer     Social History:  Alcohol: none  Smoking: none    Labs: Lipid Panel     Component Value Date/Time   CHOL 198 12/10/2023 1432   CHOL 172 10/11/2022 1050   TRIG 174.0 (H) 12/10/2023 1432   HDL 48.10 12/10/2023 1432   HDL 43 10/11/2022 1050   CHOLHDL 4 12/10/2023 1432   VLDL 34.8 12/10/2023 1432   LDLCALC 115 (H) 12/10/2023 1432   LDLCALC 101 (H) 10/11/2022 1050   LDLDIRECT 96.0 11/06/2022 1623   LABVLDL 28 10/11/2022 1050    Past Medical History:  Diagnosis Date   Acute MI, subendocardial (HCC)    Arthritis    Back pain 05/31/2013   Benign paroxysmal positional vertigo 08/05/2016   CAD (coronary artery disease)    a. NSTEMI 10/2011: LHC 11/19/11: pLAD 30%, oOM 60%, AVCFX 30%, CFX after OM2 30%, pRCA 60 and 70%, then 99%, AM 80-90% with TIMI 3 flow.  PCI: Promus DES x 2 to RCA; b. 06/2012 Cath: patent RCA stents w/ subtl occl of Acute Marginal (jailed)->Med rx; c. 05/2015 MV: EF 59%, mod mid infsept/inf/ap lat/ap infarct with peri-infarct isch-->Med Rx;  d. 02/2016 Cath: LM nl, LAD 45m, RI 50, RCA patent stents.   Colitis    Facial skin lesion 01/28/2017   GERD (gastroesophageal reflux disease)    occasional   Hyperlipidemia    Hypertension    Hypertensive heart disease    a. Echocardiogram 11/19/11: Difficult acoustic windows, EF 60-65%, normal LV wall thickness, grade 1 diastolic dysfunction   Hypoparathyroidism (HCC)    Low zinc level 11/26/2016   Multinodular thyroid    Goiter s/p thyroidectomy in 2007 with post-op  hypocalcemia and post-op hypothyroidism/hypoparathyroidism with hypocalcemia   Neck pain on right side 07/13/2013   Nocturia 05/31/2013   Osteoarthritis    Pain in right axilla 03/13/2016   Palpitations    a. 03/2012 - patient set up for event monitor but did not wear correctly -  she declined wearing a repeat monitor   Post-surgical hypothyroidism    Sun-damaged skin 12/05/2014   Tubular adenoma of colon 06/2011   Unspecified constipation 05/31/2013   Vertigo    Zinc deficiency 11/26/2015    Current Outpatient  Medications on File Prior to Visit  Medication Sig Dispense Refill   amLODipine (NORVASC) 10 MG tablet Take 1 tablet (10 mg total) by mouth daily. 90 tablet 2   aspirin EC 81 MG tablet Take 81 mg by mouth daily.     B Complex-C (B-COMPLEX WITH VITAMIN C) tablet Take 1 tablet by mouth daily.     bisoprolol (ZEBETA) 5 MG tablet Take 0.5 tablets (2.5 mg total) by mouth daily. 45 tablet 1   calcitRIOL (ROCALTROL) 0.25 MCG capsule TAKE 3 CAPSULES BY MOUTH DAILY 300 capsule 3   Cholecalciferol (VITAMIN D) 125 MCG (5000 UT) CAPS Take 5,000 Units by mouth daily in the afternoon. 100 capsule 3   ciclopirox (PENLAC) 8 % solution Apply topically at bedtime. Apply over nail and surrounding skin. Apply daily over previous coat. After seven (7) days, may remove with alcohol and continue cycle. 6.6 mL 0   clopidogrel (PLAVIX) 75 MG tablet Take 1 tablet (75 mg total) by mouth daily. 90 tablet 3   furosemide (LASIX) 20 MG tablet TAKE 1 TABLET BY MOUTH ONCE  WEEKLY AS NEEDED 15 tablet 3   hydrALAZINE (APRESOLINE) 10 MG tablet TAKE 1/2 TABLET (5 MG) BY MOUTH AS NEEDED FOR BP ABOVE 160 OR GREATER 30 tablet 6   hydrocortisone (ANUSOL-HC) 2.5 % rectal cream Place 1 Application rectally 2 (two) times daily. 30 g 1   isosorbide mononitrate (IMDUR) 60 MG 24 hr tablet TAKE 1 TABLET BY MOUTH DAILY 90 tablet 3   levothyroxine (SYNTHROID) 112 MCG tablet Take 1 tablet (112 mcg total) by mouth 3 (three) times a week. 90 tablet 1   Multiple Vitamin (MULTIVITAMIN WITH MINERALS) TABS tablet Take 1 tablet by mouth daily.     nitroGLYCERIN (NITROSTAT) 0.4 MG SL tablet DISSOLVE 1 TABLET UNDER THE  TONGUE EVERY 5 MINUTES AS NEEDED FOR CHEST PAIN. MAX OF 3 TABLETS IN 15 MINUTES. CALL 911 IF PAIN  PERSISTS. 100 tablet 3   pantoprazole (PROTONIX) 40 MG tablet TAKE 1 TABLET BY MOUTH DAILY 90 tablet 3   REPATHA SURECLICK 140 MG/ML SOAJ INJECT 140MG  SUBCUTANEOUSLY  EVERY 2 WEEKS 6 mL 3   urea (CARMOL) 10 % cream Apply topically as needed.  71 g 0   No current facility-administered medications on file prior to visit.    Allergies  Allergen Reactions   Zetia [Ezetimibe] Other (See Comments)    Myalgia, paresthesias and weakness  Penicillin G Rash    Assessment/Plan:  1. Hyperlipidemia -  Problem  Hyperlipidemia   Qualifier: Diagnosis of  By: [name], MD, Blenda Peals  Current Medications: Repatha 140 mg SQ Q14D, Crestor 40 mg twice a week  Intolerances: Zetia 10 mg- Myalgia, paresthesias and weakness  Risk Factors: TIA, PAD, CAD, s/p MI, HTN LDL goal: <70 mg/dl TG <578 mg/dl  Last lab: TC 469 mg/dl, TG 629 mg/dl, HDL 52.84 LDLc 132    Hyperlipidemia Assessment:  LDL goal: < 70 mg/dl TG gaol <440 ng/dl last LDLc 102 mg/dl TG 725 and (36/64/4034) Tolerates Repatha and Crestor 40 mg twice a week   Intolerance to high intensity statins - Crestor 40 mg daily and Zetia 10 mg daily  Discussed next potential option-, bempedoic acid; cost, dosing efficacy, side effects  Follows heart healthy diet but chocolates are her weakness [facility]- [place] 30 min three times per week   Plan: Reduce Crestor dose from 40 mg to 20 mg daily if not tolerated can cut down to 10 mg daily continue taking Repatha 140 mg [address] Q 14 D Start taking Nexletol 180 mg daily - patient made aware Kennedy Bucker will cover the co-pay  Uric acid lab today and f/u lab - lipid and uric acid March 17,2025 Cut down on sweets and exercise 4-5 days per week will help to lower TG   Thank you,  Carmela Hurt, Pharm.D [facility] of [facility] [address], [place], [place] [address]  Phone: [phone]; Fax: [phone]

## 2023-12-20 LAB — URIC ACID: Uric Acid: 6.1 mg/dL (ref 3.1–7.9)

## 2023-12-23 ENCOUNTER — Other Ambulatory Visit (HOSPITAL_BASED_OUTPATIENT_CLINIC_OR_DEPARTMENT_OTHER): Payer: 59

## 2023-12-24 ENCOUNTER — Other Ambulatory Visit (HOSPITAL_COMMUNITY): Payer: Self-pay

## 2023-12-24 ENCOUNTER — Telehealth: Payer: Self-pay

## 2023-12-24 NOTE — Telephone Encounter (Signed)
 Pharmacy Patient Advocate Encounter   Received notification from CoverMyMeds that prior authorization for NEXLETOL  is required/requested.   Insurance verification completed.   The patient is insured through [facility] .   Per test claim: PA required; PA submitted to above mentioned insurance via CoverMyMeds Key/confirmation #/EOC [id number] Status is pending

## 2023-12-26 NOTE — Telephone Encounter (Signed)
 Pharmacy Patient Advocate Encounter  Received notification from [facility] that Prior Authorization for NEXLETOL has been APPROVED from 12/24/23 to 12/23/24

## 2023-12-30 ENCOUNTER — Telehealth: Payer: Self-pay | Admitting: Nurse Practitioner

## 2023-12-30 MED ORDER — NA SULFATE-K SULFATE-MG SULF 17.5-3.13-1.6 GM/177ML PO SOLN: 1.0000 | Freq: Once | ORAL | 0 refills | Status: AC

## 2023-12-30 NOTE — Telephone Encounter (Signed)
 I have resent the suprep to [facility] on [address] as requested. This is what she has instructions for and it is the same prep she used last colonoscopy. I had to leave her a detailed voice mail message with this information.

## 2023-12-30 NOTE — Telephone Encounter (Signed)
 Patient is requesting that prep for colonoscopy be the same prep that she had last time. She states that it had "flavor" to it. Please send to [facility] on [address].

## 2023-12-31 ENCOUNTER — Encounter: Payer: Self-pay | Admitting: Gastroenterology

## 2024-01-03 ENCOUNTER — Telehealth: Payer: Self-pay

## 2024-01-03 NOTE — Telephone Encounter (Signed)
 [facility] Pre-operative Risk Assessment     Request for surgical clearance:     Endoscopy Procedure  What type of surgery is being performed?     Colonoscopy  When is this surgery scheduled?     02/06/2024  What type of clearance is required ?   Pharmacy  Are there any medications that need to be held prior to surgery and how long? Plavix , 5 days  Practice name and name of physician performing surgery?      [facility]  What is your office phone and fax number?      Phone- [phone]  Fax- [phone]  Anesthesia type (None, local, MAC, general) ?       MAC   Please route your response to Anik Wesch, CMA

## 2024-01-03 NOTE — Telephone Encounter (Addendum)
 I spoke with [name] and unfortunately her Plavix got overlooked. I will send to get clearance and I have cancelled her colonoscopy for 01/07/2024 with Dr Tomasa Rand. I will mail her new instructions. Confirmed her address.

## 2024-01-03 NOTE — Telephone Encounter (Signed)
Patient needing to be further advised on prep.

## 2024-01-06 NOTE — Telephone Encounter (Signed)
   Patient Name: [name]  DOB: August 18, 1946 MRN: [id number]  Primary Cardiologist: Vina Gull, MD  Chart reviewed as part of pre-operative protocol coverage.   Per office protocol, patient may hold Plavix  for 5 days prior to procedure. Please resume Plavix  as soon as possible postprocedure, at the discretion of the surgeon. It is recommended if the patient continue aspirin  throughout the perioperative period. However, if aspirin  needs to be held, please contact our office for further recommendations.   I will route this recommendation to the requesting party via Epic fax function and remove from pre-op pool.  Please call with questions.  Wyn Raddle, Jackee Shove, NP 01/06/2024, 9:29 AM

## 2024-01-06 NOTE — Telephone Encounter (Signed)
 I left [name] a detailed message about holding her clopidogrel 5 days prior to her procedure. Last dose 01/31/2024. I told her to call me back to confirm she got the message.

## 2024-01-07 ENCOUNTER — Encounter: Payer: 59 | Admitting: Gastroenterology

## 2024-01-07 ENCOUNTER — Telehealth: Payer: Self-pay | Admitting: Family

## 2024-01-07 ENCOUNTER — Ambulatory Visit: Payer: 59 | Admitting: Family

## 2024-01-07 VITALS — BP 112/57 | HR 64 | Temp 97.9°F | Resp 18 | Ht 64.0 in | Wt 193.4 lb

## 2024-01-07 DIAGNOSIS — J40 Bronchitis, not specified as acute or chronic: Secondary | ICD-10-CM

## 2024-01-07 DIAGNOSIS — R202 Paresthesia of skin: Secondary | ICD-10-CM | POA: Diagnosis not present

## 2024-01-07 LAB — BASIC METABOLIC PANEL
BUN: 14 mg/dL (ref 6–23)
CO2: 28 meq/L (ref 19–32)
Calcium: 6.6 mg/dL — ABNORMAL LOW (ref 8.4–10.5)
Chloride: 103 meq/L (ref 96–112)
Creatinine, Ser: 1.05 mg/dL (ref 0.40–1.20)
GFR: 51.48 mL/min — ABNORMAL LOW (ref >60.00)
Glucose, Bld: 88 mg/dL (ref 70–99)
Potassium: 4.5 meq/L (ref 3.5–5.1)
Sodium: 143 meq/L (ref 135–145)

## 2024-01-07 LAB — B12 AND FOLATE PANEL
Folate: 15.2 ng/mL (ref >5.9)
Vitamin B-12: 842 pg/mL (ref 211–911)

## 2024-01-07 MED ORDER — PREDNISONE 10 MG PO TABS: ORAL_TABLET | ORAL | 0 refills | Status: DC

## 2024-01-07 MED ORDER — ALBUTEROL SULFATE HFA 108 (90 BASE) MCG/ACT IN AERS: 2.0000 | INHALATION_SPRAY | Freq: Four times a day (QID) | RESPIRATORY_TRACT | 0 refills | Status: DC | PRN

## 2024-01-07 MED ORDER — AZITHROMYCIN 250 MG PO TABS: ORAL_TABLET | ORAL | 0 refills | Status: AC

## 2024-01-07 NOTE — Telephone Encounter (Signed)
 Advised pt that calcium  is very low.  She states compliance with calcitriol .  Notes that she is not always compliant with calcium  supplement. Has 500mg  calcium  tabs. Advised pt to take 2 tabs tonight, 2 tabs tomorrow AM and 2 tabs at noon.  Will bring her back tomorrow afternoon for repeat labs. If increased numbness, weakness or palpitations occur she is advised to go to the ER.   Levorn can you please bring her back for lab draw tomorrow afternoon?

## 2024-01-07 NOTE — Patient Instructions (Signed)
 VISIT SUMMARY:  You came in today with a week-long history of respiratory symptoms, including sneezing, a severe cough, and a fever that peaked at 102 degrees Fahrenheit but has since resolved. You also reported a sensation of 'whispering' in your chest and occasional tightness in your breathing. Your COVID-19 tests were negative.  YOUR PLAN:  -UPPER RESPIRATORY INFECTION: An upper respiratory infection is a condition that affects the nose, throat, and airways. Your symptoms started with sneezing and progressed to a severe cough and fever. Your throat examination showed redness, and your lung sounds were diminished, indicating possible bronchospasm. We have prescribed Prednisone  for a week to reduce inflammation and improve lung function, and an inhaler to help with your breathing (2 puffs every 6 hours as needed). If your symptoms do not improve in 2-3 days, we may consider starting antibiotics.  INSTRUCTIONS:  If your symptoms do not improve by Friday (01/10/2024), please contact our office. A follow-up summary will be provided via MyChart.

## 2024-01-07 NOTE — Assessment & Plan Note (Signed)
  Symptoms started last week with sneezing, progressing to cough and fever (102F highest). Fever resolved yesterday. No body aches. Throat examination revealed erythema. Lung sounds were diminished at bases. -Prescribe Prednisone  taper to reduce inflammation and improve lung function. -Rx with empiric zpak given recent fever. -Prescribe an albuterol  inhaler (2 puffs every 6 hours as needed) to help with breathing. -If symptoms do not improve in 2-3 days.

## 2024-01-07 NOTE — Progress Notes (Signed)
 Subjective:     Patient ID: [name], female    DOB: 08/11/1946, 78 y.o.   MRN: [id number]  Chief Complaint  Patient presents with   URI    Cough, chest congestion, sneezing x 5 days. Taking Tylenol .     HPI  Discussed the use of AI scribe software for clinical note transcription with the patient, who gave verbal consent to proceed.  History of Present Illness   The patient presents with a week-long history of respiratory symptoms. She initially experienced sneezing, which progressed to a 'terrible' cough. She also reported a fever peaking at 102 degrees Fahrenheit, which has since resolved. The patient denies body aches. She has tested negative for COVID-19 twice. She reports hearing wheezing in her chest and occasional tightness in her breathing. She denies any improvement in her symptoms over the past week.     Just before leaving she notes that her legs sometimes fall asleep when she is sitting on the toilet and she is requesting that we draw a calcium  level.      Health Maintenance Due  Topic Date Due   Zoster Vaccines- Shingrix (1 of 2) Never done   Cervical Cancer Screening (Pap smear)  12/28/2021   DEXA SCAN  02/06/2023   Colonoscopy  03/03/2023   Medicare Annual Wellness (AWV)  05/08/2023    Past Medical History:  Diagnosis Date   Acute MI, subendocardial (HCC)    Arthritis    Back pain 05/31/2013   Benign paroxysmal positional vertigo 08/05/2016   CAD (coronary artery disease)    a. NSTEMI 10/2011: LHC 11/19/11: pLAD 30%, oOM 60%, AVCFX 30%, CFX after OM2 30%, pRCA 60 and 70%, then 99%, AM 80-90% with TIMI 3 flow.  PCI: Promus DES x 2 to RCA; b. 06/2012 Cath: patent RCA stents w/ subtl occl of Acute Marginal (jailed)->Med rx; c. 05/2015 MV: EF 59%, mod mid infsept/inf/ap lat/ap infarct with peri-infarct isch-->Med Rx; d. 02/2016 Cath: LM nl, LAD 44m, RI 50, RCA patent stents.   Colitis    Facial skin lesion 01/28/2017   GERD (gastroesophageal reflux disease)     occasional   Hyperlipidemia    Hypertension    Hypertensive heart disease    a. Echocardiogram 11/19/11: Difficult acoustic windows, EF 60-65%, normal LV wall thickness, grade 1 diastolic dysfunction   Hypoparathyroidism (HCC)    Low zinc  level 11/26/2016   Multinodular thyroid     Goiter s/p thyroidectomy in 2007 with post-op  hypocalcemia and post-op hypothyroidism/hypoparathyroidism with hypocalcemia   Neck pain on right side 07/13/2013   Nocturia 05/31/2013   Osteoarthritis    Pain in right axilla 03/13/2016   Palpitations    a. 03/2012 - patient set up for event monitor but did not wear correctly -  she declined wearing a repeat monitor   Post-surgical hypothyroidism    Sun-damaged skin 12/05/2014   Tubular adenoma of colon 06/2011   Unspecified constipation 05/31/2013   Vertigo    Zinc  deficiency 11/26/2015    Past Surgical History:  Procedure Laterality Date   CARDIAC CATHETERIZATION N/A 03/08/2016   Procedure: Left Heart Cath and Coronary Angiography;  Surgeon: Candyce GORMAN Reek, MD;  Location: [facility] INVASIVE CV LAB;  Service: Cardiovascular;  Laterality: N/A;   COLONOSCOPY  Aenomatous polyps   07/05/2011   COLONOSCOPY N/A 09/28/2014   Procedure: COLONOSCOPY;  Surgeon: Gwendlyn ONEIDA Buddy, MD;  Location: [facility] ENDOSCOPY;  Service: Endoscopy;  Laterality: N/A;   CORONARY ANGIOPLASTY WITH STENT PLACEMENT  LEFT HEART CATH AND CORONARY ANGIOGRAPHY N/A 12/09/2018   Procedure: LEFT HEART CATH AND CORONARY ANGIOGRAPHY;  Surgeon: Dann Candyce RAMAN, MD;  Location: [facility] INVASIVE CV LAB;  Service: Cardiovascular;  Laterality: N/A;   LEFT HEART CATHETERIZATION WITH CORONARY ANGIOGRAM N/A 07/17/2012   Procedure: LEFT HEART CATHETERIZATION WITH CORONARY ANGIOGRAM;  Surgeon: Debby JONETTA Como, MD;  Location: [facility] CATH LAB;  Service: Cardiovascular;  Laterality: N/A;   PERCUTANEOUS CORONARY STENT INTERVENTION (PCI-S) N/A 11/19/2011   Procedure: PERCUTANEOUS CORONARY STENT INTERVENTION (PCI-S);  Surgeon:  Debby JONETTA Como, MD;  Location: [facility] CATH LAB;  Service: Cardiovascular;  Laterality: N/A;   POLYPECTOMY     SHOULDER ARTHROSCOPY W/ ROTATOR CUFF REPAIR     right   TOTAL THYROIDECTOMY  2007   GOITER    Family History  Problem Relation Age of Onset   Colon cancer Brother    Cancer Brother        COLON   Hypertension Father    Heart disease Father    Heart attack Father    Blindness Sister    Congestive Heart Failure Sister    Hypertension Sister    Healthy Daughter    Healthy Son    Thyroid  disease Sister    Cancer Brother        multiple myelomas   Healthy Daughter    Healthy Daughter    Diabetes Neg Hx    Prostate cancer Neg Hx    Breast cancer Neg Hx    Esophageal cancer Neg Hx    Rectal cancer Neg Hx    Stomach cancer Neg Hx     Social History   Socioeconomic History   Marital status: Married    Spouse name: Not on file   Number of children: 4   Years of education: 14   Highest education level: Not on file  Occupational History   Occupation: Sales person at [employer]  Tobacco Use   Smoking status: Never   Smokeless tobacco: Never  Vaping Use   Vaping status: Never Used  Substance and Sexual Activity   Alcohol use: Not Currently   Drug use: No   Sexual activity: Not Currently  Other Topics Concern   Not on file  Social History Narrative   Lives at home alone.  Her son lives near her.   Right-handed.   1 cup coffee per day.   Social Drivers of Corporate Investment Banker Strain: Low Risk  (05/07/2022)   Overall Financial Resource Strain (CARDIA)    Difficulty of Paying Living Expenses: Not hard at all  Food Insecurity: No Food Insecurity (06/05/2023)   Hunger Vital Sign    Worried About Running Out of Food in the Last Year: Never true    Ran Out of Food in the Last Year: Never true  Transportation Needs: No Transportation Needs (06/05/2023)   PRAPARE - Administrator, Civil Service (Medical): No    Lack of Transportation (Non-Medical): No   Physical Activity: Sufficiently Active (05/07/2022)   Exercise Vital Sign    Days of Exercise per Week: 7 days    Minutes of Exercise per Session: 30 min  Stress: No Stress Concern Present (05/07/2022)   [employer] of Occupational Health - Occupational Stress Questionnaire    Feeling of Stress : Not at all  Social Connections: Unknown (08/22/2022)   [facility] from [facility]   Social Network    Social Network: Not on file  Intimate Partner Violence: Not At  Risk (02/06/2023)   Humiliation, Afraid, Rape, and Kick questionnaire    Fear of Current or Ex-Partner: No    Emotionally Abused: No    Physically Abused: No    Sexually Abused: No    Outpatient Medications Prior to Visit  Medication Sig Dispense Refill   amLODipine  (NORVASC ) 10 MG tablet Take 1 tablet (10 mg total) by mouth daily. 90 tablet 2   aspirin  EC 81 MG tablet Take 81 mg by mouth daily.     B Complex-C (B-COMPLEX WITH VITAMIN C) tablet Take 1 tablet by mouth daily.     Bempedoic Acid  (NEXLETOL ) 180 MG TABS Take 1 tablet (180 mg total) by mouth daily. 30 tablet 11   bisoprolol  (ZEBETA ) 5 MG tablet Take 0.5 tablets (2.5 mg total) by mouth daily. 45 tablet 1   calcitRIOL  (ROCALTROL ) 0.25 MCG capsule TAKE 3 CAPSULES BY MOUTH DAILY 300 capsule 3   Cholecalciferol  (VITAMIN D ) 125 MCG (5000 UT) CAPS Take 5,000 Units by mouth daily in the afternoon. 100 capsule 3   ciclopirox  (PENLAC ) 8 % solution Apply topically at bedtime. Apply over nail and surrounding skin. Apply daily over previous coat. After seven (7) days, may remove with alcohol and continue cycle. 6.6 mL 0   clopidogrel  (PLAVIX ) 75 MG tablet Take 1 tablet (75 mg total) by mouth daily. 90 tablet 3   furosemide  (LASIX ) 20 MG tablet TAKE 1 TABLET BY MOUTH ONCE  WEEKLY AS NEEDED 15 tablet 3   hydrALAZINE  (APRESOLINE ) 10 MG tablet TAKE 1/2 TABLET (5 MG) BY MOUTH AS NEEDED FOR BP ABOVE 160 OR GREATER 30 tablet 6   hydrocortisone  (ANUSOL -HC) 2.5 % rectal  cream Place 1 Application rectally 2 (two) times daily. 30 g 1   isosorbide  mononitrate (IMDUR ) 60 MG 24 hr tablet TAKE 1 TABLET BY MOUTH DAILY 90 tablet 3   levothyroxine  (SYNTHROID ) 112 MCG tablet Take 1 tablet (112 mcg total) by mouth 3 (three) times a week. 90 tablet 1   Multiple Vitamin (MULTIVITAMIN WITH MINERALS) TABS tablet Take 1 tablet by mouth daily.     nitroGLYCERIN  (NITROSTAT ) 0.4 MG SL tablet DISSOLVE 1 TABLET UNDER THE  TONGUE EVERY 5 MINUTES AS NEEDED FOR CHEST PAIN. MAX OF 3 TABLETS IN 15 MINUTES. CALL 911 IF PAIN  PERSISTS. 100 tablet 3   pantoprazole  (PROTONIX ) 40 MG tablet TAKE 1 TABLET BY MOUTH DAILY 90 tablet 3   REPATHA  SURECLICK 140 MG/ML SOAJ INJECT 140MG  SUBCUTANEOUSLY  EVERY 2 WEEKS 6 mL 3   rosuvastatin  (CRESTOR ) 20 MG tablet Take 1 tablet (20 mg total) by mouth daily. 90 tablet 3   urea  (CARMOL) 10 % cream Apply topically as needed. 71 g 0   No facility-administered medications prior to visit.    Allergies  Allergen Reactions   Zetia  [Ezetimibe ] Other (See Comments)    Myalgia, paresthesias and weakness   Penicillin G Rash    ROS     Objective:    Physical Exam Constitutional:      General: She is not in acute distress.    Appearance: Normal appearance. She is well-developed.  HENT:     Head: Normocephalic and atraumatic.     Right Ear: Tympanic membrane, ear canal and external ear normal.     Left Ear: Tympanic membrane, ear canal and external ear normal.     Mouth/Throat:     Mouth: Mucous membranes are moist.     Pharynx: No pharyngeal swelling or posterior oropharyngeal erythema.  Eyes:  General: No scleral icterus. Neck:     Thyroid : No thyromegaly.  Cardiovascular:     Rate and Rhythm: Normal rate and regular rhythm.     Heart sounds: Normal heart sounds. No murmur heard. Pulmonary:     Effort: Pulmonary effort is normal. No respiratory distress.     Breath sounds: Normal breath sounds. Decreased air movement (to both bases) present.  No wheezing.  Musculoskeletal:     Cervical back: Neck supple.  Lymphadenopathy:     Cervical: Cervical adenopathy present.  Skin:    General: Skin is warm and dry.  Neurological:     Mental Status: She is alert and oriented to person, place, and time.  Psychiatric:        Mood and Affect: Mood normal.        Behavior: Behavior normal.        Thought Content: Thought content normal.        Judgment: Judgment normal.      BP (!) 112/57 (BP Location: Left Arm, Patient Position: Sitting, Cuff Size: Large)   Pulse 64   Temp 97.9 F (36.6 C) (Oral)   Resp 18   Ht 5' 4 (1.626 m)   Wt 193 lb 6.4 oz (87.7 kg)   SpO2 95%   BMI 33.20 kg/m  Wt Readings from Last 3 Encounters:  01/07/24 193 lb 6.4 oz (87.7 kg)  12/10/23 195 lb (88.5 kg)  11/14/23 196 lb 6 oz (89.1 kg)       Assessment & Plan:   Problem List Items Addressed This Visit       Unprioritized   Paresthesia   Relevant Orders   Basic Metabolic Panel (BMET)   B12 and Folate Panel   Bronchitis - Primary    Symptoms started last week with sneezing, progressing to cough and fever (102F highest). Fever resolved yesterday. No body aches. Throat examination revealed erythema. Lung sounds were diminished at bases. -Prescribe Prednisone  taper to reduce inflammation and improve lung function. -Rx with empiric zpak given recent fever. -Prescribe an albuterol  inhaler (2 puffs every 6 hours as needed) to help with breathing. -If symptoms do not improve in 2-3 days.      Relevant Medications   predniSONE  (DELTASONE ) 10 MG tablet   albuterol  (VENTOLIN  HFA) 108 (90 Base) MCG/ACT inhaler   azithromycin  (ZITHROMAX ) 250 MG tablet   Other Relevant Orders   Basic Metabolic Panel (BMET)   B12 and Folate Panel    I am having Henretta T. Bamford start on predniSONE , albuterol , and azithromycin . I am also having her maintain her aspirin  EC, B-complex with vitamin C, multivitamin with minerals, urea , Vitamin D , Repatha  SureClick,  hydrALAZINE , ciclopirox , pantoprazole , furosemide , isosorbide  mononitrate, clopidogrel , calcitRIOL , nitroGLYCERIN , amLODipine , bisoprolol , hydrocortisone , levothyroxine , rosuvastatin , and Nexletol .  Meds ordered this encounter  Medications   predniSONE  (DELTASONE ) 10 MG tablet    Sig: 4 tabs by mouth once daily for 2 days, then 3 tabs daily x 2 days, then 2 tabs daily x 2 days, then 1 tab daily x 2 days    Dispense:  20 tablet    Refill:  0    Supervising Provider:   DOMENICA BLACKBIRD A [[id number]]   albuterol  (VENTOLIN  HFA) 108 (90 Base) MCG/ACT inhaler    Sig: Inhale 2 puffs into the lungs every 6 (six) hours as needed for wheezing or shortness of breath.    Dispense:  8 g    Refill:  0    Supervising Provider:   DOMENICA BLACKBIRD  A [[id number]]   azithromycin  (ZITHROMAX ) 250 MG tablet    Sig: Take 2 tablets on day 1, then 1 tablet daily on days 2 through 5    Dispense:  6 tablet    Refill:  0    Supervising Provider:   DOMENICA BLACKBIRD A [[id number]]

## 2024-01-08 ENCOUNTER — Telehealth: Payer: Self-pay

## 2024-01-08 ENCOUNTER — Other Ambulatory Visit (INDEPENDENT_AMBULATORY_CARE_PROVIDER_SITE_OTHER): Payer: 59

## 2024-01-08 LAB — BASIC METABOLIC PANEL
BUN: 14 mg/dL (ref 6–23)
CO2: 31 meq/L (ref 19–32)
Calcium: 7 mg/dL — ABNORMAL LOW (ref 8.4–10.5)
Chloride: 100 meq/L (ref 96–112)
Creatinine, Ser: 0.98 mg/dL (ref 0.40–1.20)
GFR: 55.92 mL/min — ABNORMAL LOW (ref >60.00)
Glucose, Bld: 111 mg/dL — ABNORMAL HIGH (ref 70–99)
Potassium: 4.3 meq/L (ref 3.5–5.1)
Sodium: 140 meq/L (ref 135–145)

## 2024-01-08 LAB — MAGNESIUM: Magnesium: 2.2 mg/dL (ref 1.5–2.5)

## 2024-01-08 NOTE — Telephone Encounter (Signed)
 Patient scheduled for labs today 2:45

## 2024-01-08 NOTE — Telephone Encounter (Signed)
 Caller Name Chaquita Teng Caller Phone Number [phone] Call Type Message Only Information Provided Reason for Call Returning a Call from the Office Initial Comment Caller states she is calling for herself. She is returning a call from the office. Additional Comment Caller states she is calling for herself. She is returning a call from the office. Office hours provided. Disp. Time Disposition Final User 01/07/2024 6:56:31 PM General Information Provided Yes Loverson, Carolynne Citron Call Closed By: Hermenia Loose Transaction Date/Time: 01/07/2024 6:54:54 PM (ET)

## 2024-01-08 NOTE — Telephone Encounter (Signed)
 Returned patient's call. A lab appointment was made for this afternoon for a calcium  recollect.

## 2024-01-09 ENCOUNTER — Ambulatory Visit (HOSPITAL_BASED_OUTPATIENT_CLINIC_OR_DEPARTMENT_OTHER)
Admission: RE | Admit: 2024-01-09 | Discharge: 2024-01-09 | Disposition: A | Discharge status: Home / Self Care | Payer: 59 | Source: Ambulatory Visit | Attending: Family | Admitting: Family

## 2024-01-09 ENCOUNTER — Telehealth: Payer: Self-pay | Admitting: Family

## 2024-01-09 DIAGNOSIS — Z780 Asymptomatic menopausal state: Secondary | ICD-10-CM | POA: Diagnosis not present

## 2024-01-09 DIAGNOSIS — E039 Hypothyroidism, unspecified: Secondary | ICD-10-CM | POA: Diagnosis not present

## 2024-01-09 DIAGNOSIS — R29890 Loss of height: Secondary | ICD-10-CM | POA: Diagnosis not present

## 2024-01-09 NOTE — Telephone Encounter (Signed)
Patient notified of results and scheduled to come in next week for follow up labs

## 2024-01-09 NOTE — Telephone Encounter (Signed)
I left this message on her cell last night, but could you please contact pt and tell her to continue calcium 1000 mg bid, follow up in 1 week for repeat bmet dx hypocalcemia.  Calcium is improving- up to 7.0.

## 2024-01-15 ENCOUNTER — Telehealth: Payer: Self-pay | Admitting: Family

## 2024-01-15 ENCOUNTER — Other Ambulatory Visit (INDEPENDENT_AMBULATORY_CARE_PROVIDER_SITE_OTHER): Payer: 59

## 2024-01-15 ENCOUNTER — Ambulatory Visit: Payer: 59 | Admitting: Endocrinology

## 2024-01-15 LAB — BASIC METABOLIC PANEL
BUN: 21 mg/dL (ref 6–23)
CO2: 32 meq/L (ref 19–32)
Calcium: 8.2 mg/dL — ABNORMAL LOW (ref 8.4–10.5)
Chloride: 100 meq/L (ref 96–112)
Creatinine, Ser: 1.02 mg/dL (ref 0.40–1.20)
GFR: 53.29 mL/min — ABNORMAL LOW (ref >60.00)
Glucose, Bld: 103 mg/dL — ABNORMAL HIGH (ref 70–99)
Potassium: 4.6 meq/L (ref 3.5–5.1)
Sodium: 142 meq/L (ref 135–145)

## 2024-01-15 MED ORDER — CALCIUM CARBONATE ANTACID 500 MG PO CHEW: 2.0000 | CHEWABLE_TABLET | Freq: Two times a day (BID) | ORAL | Status: AC

## 2024-01-15 NOTE — Telephone Encounter (Signed)
Calcium is improved.  Please continue Calcium 1000 mg bid.

## 2024-01-16 NOTE — Telephone Encounter (Signed)
I spoke to Kelda and she verbalized understanding to hold the clopidogrel 5 days prior to the procedure.

## 2024-01-16 NOTE — Telephone Encounter (Signed)
Patient notified of results and provider's recomendations

## 2024-01-17 NOTE — Progress Notes (Unsigned)
Cardiology Office Note   Date:  01/20/2024   ID:  VIRGINIE Rice, DOB 12-17-1946, MRN [id number]  PCP:  Bradd Canary, MD  Cardiologist:   Dietrich Pates, MD   F/U of CAD    History of Present Illness:  [name] is a 78 y.o. female with CAD - s/p NSTEMI 10/2011 with DESx2  Also has a hx of  GERD, HTN, HLD, vertigo.  L heart cath in Dec 2019 showed:  LAD with 25%  OM  with 100% with Left to R  collaterals  RCA with patent stent   Normal LVEF    Echo in early Sept 2020   LVEF and RVEF were normal   Mild diastolic dysfunction      In 2023 the pt had some numbness on L face   Seen by  neuro, ultimately felt not to represent a TIA  Echo done   Normal   Carotid USN without severe dz   Symptoms felt to be due to DJD of neck     April 2024  Seen in ER for CP/shoulder pain  BP severely elevated   Symptoms eased with NTG   I saw the pt in JUne 2024 Since seen the pt says she is fatigued , weak    No CP  BP at home was 105/45  I aw the pt in AUgust 2024  and I recomm that she back down on amlodipine to 5 mg daily     Oct 15, 2023-  Pt seen in clinic  [name] about 2 wks ago she started having spells where she feels "light"    Not dizzy   Felt like she has to catch her breath.   Episodes last about 1 minute Denies CP   One episode occurred while driving   One woke her from sleep   Felt OK after the spells   Pt set up for an event monitor    Fall 2024 patient has been to ER twice for CP  First visit was on 10/23   She was at home   Had dull pain in chest   BP was very high   180   Took 2 ASA and an NTG.   BP stayed high  Went to ER   There it wa in the 150s  Trops negative   Pain eased off Second visit was on 10/29   She was in bed when she developed a knife like pain in cest   L parasternal   Took ASA and NTG  Pain came back x 3  Brief  Not pleuritic Went to ER  BP 129/  CTA negative  Sent home   I saw the pt in Nov 2024   She has been seen by Pharmacy in the interval for lipids Overall the  pt says she is doing OK   Occasional fleeting CP  Breathing is OK No dizziness       Current Meds  Medication Sig   albuterol (VENTOLIN HFA) 108 (90 Base) MCG/ACT inhaler Inhale 2 puffs into the lungs every 6 (six) hours as needed for wheezing or shortness of breath.   amLODipine (NORVASC) 10 MG tablet Take 1 tablet (10 mg total) by mouth daily.   aspirin EC 81 MG tablet Take 81 mg by mouth daily.   B Complex-C (B-COMPLEX WITH VITAMIN C) tablet Take 1 tablet by mouth daily.   Bempedoic Acid (NEXLETOL) 180 MG TABS Take 1 tablet (180 mg total) by  mouth daily.   bisoprolol (ZEBETA) 5 MG tablet Take 0.5 tablets (2.5 mg total) by mouth daily.   calcitRIOL (ROCALTROL) 0.25 MCG capsule TAKE 3 CAPSULES BY MOUTH DAILY   calcium carbonate (TUMS) 500 MG chewable tablet Chew 2 tablets (400 mg of elemental calcium total) by mouth 2 (two) times daily.   Cholecalciferol (VITAMIN D) 125 MCG (5000 UT) CAPS Take 5,000 Units by mouth daily in the afternoon.   ciclopirox (PENLAC) 8 % solution Apply topically at bedtime. Apply over nail and surrounding skin. Apply daily over previous coat. After seven (7) days, may remove with alcohol and continue cycle.   clopidogrel (PLAVIX) 75 MG tablet Take 1 tablet (75 mg total) by mouth daily.   furosemide (LASIX) 20 MG tablet TAKE 1 TABLET BY MOUTH ONCE  WEEKLY AS NEEDED   hydrALAZINE (APRESOLINE) 10 MG tablet TAKE 1/2 TABLET (5 MG) BY MOUTH AS NEEDED FOR BP ABOVE 160 OR GREATER   hydrocortisone (ANUSOL-HC) 2.5 % rectal cream Place 1 Application rectally 2 (two) times daily.   isosorbide mononitrate (IMDUR) 60 MG 24 hr tablet TAKE 1 TABLET BY MOUTH DAILY   levothyroxine (SYNTHROID) 112 MCG tablet Take 1 tablet (112 mcg total) by mouth 3 (three) times a week.   Multiple Vitamin (MULTIVITAMIN WITH MINERALS) TABS tablet Take 1 tablet by mouth daily.   nitroGLYCERIN (NITROSTAT) 0.4 MG SL tablet DISSOLVE 1 TABLET UNDER THE  TONGUE EVERY 5 MINUTES AS NEEDED FOR CHEST PAIN. MAX OF  3 TABLETS IN 15 MINUTES. CALL 911 IF PAIN  PERSISTS.   pantoprazole (PROTONIX) 40 MG tablet TAKE 1 TABLET BY MOUTH DAILY   REPATHA SURECLICK 140 MG/ML SOAJ INJECT 140MG  SUBCUTANEOUSLY  EVERY 2 WEEKS   rosuvastatin (CRESTOR) 20 MG tablet Take 1 tablet (20 mg total) by mouth daily.   urea (CARMOL) 10 % cream Apply topically as needed.     Allergies:   Zetia [ezetimibe] and Penicillin g   Past Medical History:  Diagnosis Date   Acute MI, subendocardial (HCC)    Arthritis    Back pain 05/31/2013   Benign paroxysmal positional vertigo 08/05/2016   CAD (coronary artery disease)    a. NSTEMI 10/2011: LHC 11/19/11: pLAD 30%, oOM 60%, AVCFX 30%, CFX after OM2 30%, pRCA 60 and 70%, then 99%, AM 80-90% with TIMI 3 flow.  PCI: Promus DES x 2 to RCA; b. 06/2012 Cath: patent RCA stents w/ subtl occl of Acute Marginal (jailed)->Med rx; c. 05/2015 MV: EF 59%, mod mid infsept/inf/ap lat/ap infarct with peri-infarct isch-->Med Rx; d. 02/2016 Cath: LM nl, LAD 55m, RI 50, RCA patent stents.   Colitis    Facial skin lesion 01/28/2017   GERD (gastroesophageal reflux disease)    occasional   Hyperlipidemia    Hypertension    Hypertensive heart disease    a. Echocardiogram 11/19/11: Difficult acoustic windows, EF 60-65%, normal LV wall thickness, grade 1 diastolic dysfunction   Hypoparathyroidism (HCC)    Low zinc level 11/26/2016   Multinodular thyroid    Goiter s/p thyroidectomy in 2007 with post-op  hypocalcemia and post-op hypothyroidism/hypoparathyroidism with hypocalcemia   Neck pain on right side 07/13/2013   Nocturia 05/31/2013   Osteoarthritis    Pain in right axilla 03/13/2016   Palpitations    a. 03/2012 - patient set up for event monitor but did not wear correctly -  she declined wearing a repeat monitor   Post-surgical hypothyroidism    Sun-damaged skin 12/05/2014   Tubular adenoma of colon 06/2011  Unspecified constipation 05/31/2013   Vertigo    Zinc deficiency 11/26/2015    Past Surgical History:   Procedure Laterality Date   CARDIAC CATHETERIZATION N/A 03/08/2016   Procedure: Left Heart Cath and Coronary Angiography;  Surgeon: Corky Crafts, MD;  Location: [facility] INVASIVE CV LAB;  Service: Cardiovascular;  Laterality: N/A;   COLONOSCOPY  Aenomatous polyps   07/05/2011   COLONOSCOPY N/A 09/28/2014   Procedure: COLONOSCOPY;  Surgeon: Meryl Dare, MD;  Location: [facility] ENDOSCOPY;  Service: Endoscopy;  Laterality: N/A;   CORONARY ANGIOPLASTY WITH STENT PLACEMENT     LEFT HEART CATH AND CORONARY ANGIOGRAPHY N/A 12/09/2018   Procedure: LEFT HEART CATH AND CORONARY ANGIOGRAPHY;  Surgeon: Corky Crafts, MD;  Location: [facility] INVASIVE CV LAB;  Service: Cardiovascular;  Laterality: N/A;   LEFT HEART CATHETERIZATION WITH CORONARY ANGIOGRAM N/A 07/17/2012   Procedure: LEFT HEART CATHETERIZATION WITH CORONARY ANGIOGRAM;  Surgeon: Herby Abraham, MD;  Location: [facility] CATH LAB;  Service: Cardiovascular;  Laterality: N/A;   PERCUTANEOUS CORONARY STENT INTERVENTION (PCI-S) N/A 11/19/2011   Procedure: PERCUTANEOUS CORONARY STENT INTERVENTION (PCI-S);  Surgeon: Herby Abraham, MD;  Location: [facility] CATH LAB;  Service: Cardiovascular;  Laterality: N/A;   POLYPECTOMY     SHOULDER ARTHROSCOPY W/ ROTATOR CUFF REPAIR     right   TOTAL THYROIDECTOMY  2007   GOITER     Social History:  The patient  reports that she has never smoked. She has never used smokeless tobacco. She reports that she does not currently use alcohol. She reports that she does not use drugs.   Family History:  The patient's family history includes Blindness in her sister; Cancer in her brother and brother; Colon cancer in her brother; Congestive Heart Failure in her sister; Healthy in her daughter, daughter, daughter, and son; Heart attack in her father; Heart disease in her father; Hypertension in her father and sister; Thyroid disease in her sister.    ROS:  Please see the history of present illness. All other systems are reviewed and   Negative to the above problem except as noted.    PHYSICAL EXAM: VS:  BP 134/62   Pulse (!) 57   Ht 5\' 4"  (1.626 m)   Wt 193 lb 3.2 oz (87.6 kg)   SpO2 97%   BMI 33.16 kg/m   GEN: Obese 78 yo  in no acute distress  HEENT: normal  Neck: JVP is not elevated   No bruit Cardiac: RRR; no murmur  No LE  edema  Respiratory:  clear to auscultation  GI: soft, nontender,no masses, no hepatomegaly   EKG:  EKG is not  done    Echo   Feb 2024   1. Left ventricular ejection fraction, by estimation, is 60 to 65%. The  left ventricle has normal function. The left ventricle has no regional  wall motion abnormalities. Left ventricular diastolic parameters are  consistent with Grade I diastolic  dysfunction (impaired relaxation).   2. Right ventricular systolic function is normal. The right ventricular  size is normal. There is normal pulmonary artery systolic pressure. The  estimated right ventricular systolic pressure is 24.5 mmHg.   3. The mitral valve is normal in structure. No evidence of mitral valve  regurgitation. No evidence of mitral stenosis.   4. The aortic valve is tricuspid. Aortic valve regurgitation is trivial.  Aortic valve sclerosis is present, with no evidence of aortic valve  stenosis. Aortic valve area, by VTI measures 2.64 cm. Aortic valve mean  gradient measures 4.0 mmHg. Aortic  valve Vmax measures 1.33 m/s.   5. The inferior vena cava is dilated in size with >50% respiratory  variability, suggesting right atrial pressure of 8 mmHg.   Conclusion(s)/Recommendation(s): No intracardiac source of embolism  detected on this transthoracic study. Consider a transesophageal  echocardiogram to exclude cardiac source of embolism if clinically  indicated.   Echo   Sept 2023   1. Left ventricular ejection fraction, by estimation, is 60 to 65%. The  left ventricle has normal function. The left ventricle has no regional  wall motion abnormalities. There is mild left  ventricular hypertrophy.  Left ventricular diastolic parameters  were normal.   2. Right ventricular systolic function is normal. The right ventricular  size is normal.   3. The mitral valve is abnormal. Trivial mitral valve regurgitation. No  evidence of mitral stenosis.   4. The aortic valve is tricuspid. Aortic valve regurgitation is trivial.  No aortic stenosis is present.   5. The inferior vena cava is normal in size with greater than 50%  respiratory variability, suggesting right atrial pressure of 3 mmHg.    Lipid Panel    Component Value Date/Time   CHOL 198 12/10/2023 1432   CHOL 172 10/11/2022 1050   TRIG 174.0 (H) 12/10/2023 1432   HDL 48.10 12/10/2023 1432   HDL 43 10/11/2022 1050   CHOLHDL 4 12/10/2023 1432   VLDL 34.8 12/10/2023 1432   LDLCALC 115 (H) 12/10/2023 1432   LDLCALC 101 (H) 10/11/2022 1050   LDLDIRECT 96.0 11/06/2022 1623      Wt Readings from Last 3 Encounters:  01/20/24 193 lb 3.2 oz (87.6 kg)  01/07/24 193 lb 6.4 oz (87.7 kg)  12/10/23 195 lb (88.5 kg)      ASSESSMENT AND PLAN:  1 CAD   Pt with  occluded OM that fills via collaterals  No symptoms to sugg ischemia   Follow   3  BP   BP running 120s at home on average   Follow  A little high here 134/  4  HL   Seen in lipid clinic  On REpatha, Nexletol, Crestor   Follow up lipids at end of Feb   5  Hypocalcemia  Pt with low Ca   6.6 on 1/14      On Ca supplemnets    To have BMET, Vit D and  PTH    Follow up ilater this spring        Current medicines are reviewed at length with the patient today.  The patient does not have concerns regarding medicines.  Signed, Dietrich Pates, MD  01/20/2024 9:14 PM    [facility] [address] [place], [place], [place]  [address] Phone: [phone]; Fax: [phone]

## 2024-01-20 ENCOUNTER — Other Ambulatory Visit: Payer: Self-pay | Admitting: Internal Medicine

## 2024-01-20 ENCOUNTER — Encounter: Payer: Self-pay | Admitting: Internal Medicine

## 2024-01-20 ENCOUNTER — Ambulatory Visit: Payer: 59 | Attending: Internal Medicine | Admitting: Internal Medicine

## 2024-01-20 VITALS — BP 134/62 | HR 57 | Ht 64.0 in | Wt 193.2 lb

## 2024-01-20 DIAGNOSIS — I214 Non-ST elevation (NSTEMI) myocardial infarction: Secondary | ICD-10-CM

## 2024-01-20 DIAGNOSIS — Z79899 Other long term (current) drug therapy: Secondary | ICD-10-CM

## 2024-01-20 DIAGNOSIS — I2510 Atherosclerotic heart disease of native coronary artery without angina pectoris: Secondary | ICD-10-CM

## 2024-01-20 DIAGNOSIS — E785 Hyperlipidemia, unspecified: Secondary | ICD-10-CM | POA: Diagnosis not present

## 2024-01-20 DIAGNOSIS — I10 Essential (primary) hypertension: Secondary | ICD-10-CM | POA: Diagnosis not present

## 2024-01-20 DIAGNOSIS — R531 Weakness: Secondary | ICD-10-CM | POA: Diagnosis not present

## 2024-01-20 NOTE — Assessment & Plan Note (Signed)
Supplement and monitor

## 2024-01-20 NOTE — Patient Instructions (Signed)
Medication Instructions:   *If you need a refill on your cardiac medications before your next appointment, please call your pharmacy*   Lab Work: Adding a CMET WITH YOUR FEB LABS  If you have labs (blood work) drawn today and your tests are completely normal, you will receive your results only by: MyChart Message (if you have MyChart) OR A paper copy in the mail If you have any lab test that is abnormal or we need to change your treatment, we will call you to review the results.   Testing/Procedures:    Follow-Up: At [facility], you and your health needs are our priority.  As part of our continuing mission to provide you with exceptional heart care, we have created designated Provider Care Teams.  These Care Teams include your primary Cardiologist (physician) and Advanced Practice Providers (APPs -  Physician Assistants and Nurse Practitioners) who all work together to provide you with the care you need, when you need it.  We recommend signing up for the patient portal called "MyChart".  Sign up information is provided on this After Visit Summary.  MyChart is used to connect with patients for Virtual Visits (Telemedicine).  Patients are able to view lab/test results, encounter notes, upcoming appointments, etc.  Non-urgent messages can be sent to your provider as well.   To learn more about what you can do with MyChart, go to [email or web address].    Your next appointment:  MAY 2025

## 2024-01-20 NOTE — Assessment & Plan Note (Addendum)
Encouraged increased hydration and fiber in diet. Daily probiotics. If bowels not moving can use MOM 2 tbls po in 4 oz of warm prune juice by mouth every 2-3 days. If no results then repeat in 4 hours with  Dulcolax suppository pr, may repeat again in 4 more hours as needed. Seek care if symptoms worsen. Consider daily Miralax and/or Dulcolax if symptoms persist.

## 2024-01-20 NOTE — Assessment & Plan Note (Signed)
Monitor and report any concerns, no changes to meds. Encouraged heart healthy diet such as the DASH diet and exercise as tolerated.  ?

## 2024-01-20 NOTE — Assessment & Plan Note (Signed)
Encourage heart healthy diet such as MIND or DASH diet, increase exercise, avoid trans fats, simple carbohydrates and processed foods, consider a krill or fish or flaxseed oil cap daily. Tolerating Rosuvastatin

## 2024-01-20 NOTE — Assessment & Plan Note (Signed)
On Levothyroxine, continue to monitor

## 2024-01-21 ENCOUNTER — Telehealth: Payer: Self-pay

## 2024-01-21 ENCOUNTER — Encounter: Payer: Self-pay | Admitting: Family Medicine

## 2024-01-21 ENCOUNTER — Other Ambulatory Visit: Payer: Self-pay

## 2024-01-21 ENCOUNTER — Telehealth: Payer: 59 | Admitting: Family Medicine

## 2024-01-21 VITALS — Ht 65.0 in | Wt 190.0 lb

## 2024-01-21 DIAGNOSIS — E60 Dietary zinc deficiency: Secondary | ICD-10-CM | POA: Diagnosis not present

## 2024-01-21 DIAGNOSIS — D500 Iron deficiency anemia secondary to blood loss (chronic): Secondary | ICD-10-CM | POA: Diagnosis not present

## 2024-01-21 DIAGNOSIS — E039 Hypothyroidism, unspecified: Secondary | ICD-10-CM

## 2024-01-21 DIAGNOSIS — I10 Essential (primary) hypertension: Secondary | ICD-10-CM

## 2024-01-21 DIAGNOSIS — M542 Cervicalgia: Secondary | ICD-10-CM

## 2024-01-21 DIAGNOSIS — Z79899 Other long term (current) drug therapy: Secondary | ICD-10-CM

## 2024-01-21 DIAGNOSIS — K5909 Other constipation: Secondary | ICD-10-CM

## 2024-01-21 DIAGNOSIS — M51369 Other intervertebral disc degeneration, lumbar region without mention of lumbar back pain or lower extremity pain: Secondary | ICD-10-CM

## 2024-01-21 DIAGNOSIS — R42 Dizziness and giddiness: Secondary | ICD-10-CM

## 2024-01-21 DIAGNOSIS — E782 Mixed hyperlipidemia: Secondary | ICD-10-CM | POA: Diagnosis not present

## 2024-01-21 DIAGNOSIS — I2510 Atherosclerotic heart disease of native coronary artery without angina pectoris: Secondary | ICD-10-CM

## 2024-01-21 NOTE — Telephone Encounter (Signed)
-----   Message from Dietrich Pates sent at 01/20/2024  9:19 PM EST ----- Make sure he has PTH checked with other labs

## 2024-01-22 ENCOUNTER — Encounter: Payer: Self-pay | Admitting: Family Medicine

## 2024-01-22 NOTE — Progress Notes (Signed)
MyChart Video Visit    Virtual Visit via Video Note   This patient is at least at moderate risk for complications without adequate follow up. This format is felt to be most appropriate for this patient at this time. Physical exam was limited by quality of the video and audio technology used for the visit. Juanetta, CMA was able to get the patient set up on a video visit.  Patient location: home Patient and provider in visit Provider location: Office  I discussed the limitations of evaluation and management by telemedicine and the availability of in person appointments. The patient expressed understanding and agreed to proceed.  Visit Date: 01/21/2024  Today's healthcare provider: Danise Edge, MD     Subjective:    Patient ID: [name], female    DOB: September 21, 1946, 78 y.o.   MRN: [id number]  Chief Complaint  Patient presents with  . Follow-up    HPI Discussed the use of AI scribe software for clinical note transcription with the patient, who gave verbal consent to proceed.  History of Present Illness   The patient, with a history of low back pain, thyroid issues, and cholesterol problems, presents for a follow-up visit. The patient reports that their back pain has been worsening again and expresses a desire to continue physical therapy, which they found helpful in the past. The patient also mentions a recent bout of flu-like symptoms, for which they received medication and are now feeling better. The patient is due for a colonoscopy next month and is currently managing their thyroid and cholesterol issues.        Past Medical History:  Diagnosis Date  . Acute MI, subendocardial (HCC)   . Arthritis   . Back pain 05/31/2013  . Benign paroxysmal positional vertigo 08/05/2016  . CAD (coronary artery disease)    a. NSTEMI 10/2011: LHC 11/19/11: pLAD 30%, oOM 60%, AVCFX 30%, CFX after OM2 30%, pRCA 60 and 70%, then 99%, AM 80-90% with TIMI 3 flow.  PCI: Promus DES x 2 to  RCA; b. 06/2012 Cath: patent RCA stents w/ subtl occl of Acute Marginal (jailed)->Med rx; c. 05/2015 MV: EF 59%, mod mid infsept/inf/ap lat/ap infarct with peri-infarct isch-->Med Rx; d. 02/2016 Cath: LM nl, LAD 64m, RI 50, RCA patent stents.  . Colitis   . Facial skin lesion 01/28/2017  . GERD (gastroesophageal reflux disease)    occasional  . Hyperlipidemia   . Hypertension   . Hypertensive heart disease    a. Echocardiogram 11/19/11: Difficult acoustic windows, EF 60-65%, normal LV wall thickness, grade 1 diastolic dysfunction  . Hypoparathyroidism (HCC)   . Low zinc level 11/26/2016  . Multinodular thyroid    Goiter s/p thyroidectomy in 2007 with post-op  hypocalcemia and post-op hypothyroidism/hypoparathyroidism with hypocalcemia  . Neck pain on right side 07/13/2013  . Nocturia 05/31/2013  . Osteoarthritis   . Pain in right axilla 03/13/2016  . Palpitations    a. 03/2012 - patient set up for event monitor but did not wear correctly -  she declined wearing a repeat monitor  . Post-surgical hypothyroidism   . Sun-damaged skin 12/05/2014  . Tubular adenoma of colon 06/2011  . Unspecified constipation 05/31/2013  . Vertigo   . Zinc deficiency 11/26/2015    Past Surgical History:  Procedure Laterality Date  . CARDIAC CATHETERIZATION N/A 03/08/2016   Procedure: Left Heart Cath and Coronary Angiography;  Surgeon: Corky Crafts, MD;  Location: [facility] INVASIVE CV LAB;  Service: Cardiovascular;  Laterality:  N/A;  . COLONOSCOPY  Aenomatous polyps   07/05/2011  . COLONOSCOPY N/A 09/28/2014   Procedure: COLONOSCOPY;  Surgeon: Meryl Dare, MD;  Location: [facility] ENDOSCOPY;  Service: Endoscopy;  Laterality: N/A;  . CORONARY ANGIOPLASTY WITH STENT PLACEMENT    . LEFT HEART CATH AND CORONARY ANGIOGRAPHY N/A 12/09/2018   Procedure: LEFT HEART CATH AND CORONARY ANGIOGRAPHY;  Surgeon: Corky Crafts, MD;  Location: [facility] INVASIVE CV LAB;  Service: Cardiovascular;  Laterality: N/A;  . LEFT HEART  CATHETERIZATION WITH CORONARY ANGIOGRAM N/A 07/17/2012   Procedure: LEFT HEART CATHETERIZATION WITH CORONARY ANGIOGRAM;  Surgeon: Herby Abraham, MD;  Location: [facility] CATH LAB;  Service: Cardiovascular;  Laterality: N/A;  . PERCUTANEOUS CORONARY STENT INTERVENTION (PCI-S) N/A 11/19/2011   Procedure: PERCUTANEOUS CORONARY STENT INTERVENTION (PCI-S);  Surgeon: Herby Abraham, MD;  Location: [facility] CATH LAB;  Service: Cardiovascular;  Laterality: N/A;  . POLYPECTOMY    . SHOULDER ARTHROSCOPY W/ ROTATOR CUFF REPAIR     right  . TOTAL THYROIDECTOMY  2007   GOITER    Family History  Problem Relation Age of Onset  . Colon cancer Brother   . Cancer Brother        COLON  . Hypertension Father   . Heart disease Father   . Heart attack Father   . Blindness Sister   . Congestive Heart Failure Sister   . Hypertension Sister   . Healthy Daughter   . Healthy Son   . Thyroid disease Sister   . Cancer Brother        multiple myelomas  . Healthy Daughter   . Healthy Daughter   . Diabetes Neg Hx   . Prostate cancer Neg Hx   . Breast cancer Neg Hx   . Esophageal cancer Neg Hx   . Rectal cancer Neg Hx   . Stomach cancer Neg Hx     Social History   Socioeconomic History  . Marital status: Married    Spouse name: Not on file  . Number of children: 4  . Years of education: 27  . Highest education level: Not on file  Occupational History  . Occupation: Sales person at [employer]  . Smoking status: Never  . Smokeless tobacco: Never  Vaping Use  . Vaping status: Never Used  Substance and Sexual Activity  . Alcohol use: Not Currently  . Drug use: No  . Sexual activity: Not Currently  Other Topics Concern  . Not on file  Social History Narrative   Lives at home alone.  Her son lives near her.   Right-handed.   1 cup coffee per day.   Social Drivers of Health   Financial Resource Strain: Low Risk  (05/07/2022)   Overall Financial Resource Strain (CARDIA)   . Difficulty of  Paying Living Expenses: Not hard at all  Food Insecurity: No Food Insecurity (06/05/2023)   Hunger Vital Sign   . Worried About Programme researcher, broadcasting/film/video in the Last Year: Never true   . Ran Out of Food in the Last Year: Never true  Transportation Needs: No Transportation Needs (06/05/2023)   PRAPARE - Transportation   . Lack of Transportation (Medical): No   . Lack of Transportation (Non-Medical): No  Physical Activity: Sufficiently Active (05/07/2022)   Exercise Vital Sign   . Days of Exercise per Week: 7 days   . Minutes of Exercise per Session: 30 min  Stress: No Stress Concern Present (05/07/2022)   [employer] of Occupational  Health - Occupational Stress Questionnaire   . Feeling of Stress : Not at all  Social Connections: Unknown (08/22/2022)   [facility], [facility]   Social Network   . Social Network: Not on file  Intimate Partner Violence: Not At Risk (02/06/2023)   Humiliation, Afraid, Rape, and Kick questionnaire   . Fear of Current or Ex-Partner: No   . Emotionally Abused: No   . Physically Abused: No   . Sexually Abused: No    Outpatient Medications Prior to Visit  Medication Sig Dispense Refill  . albuterol (VENTOLIN HFA) 108 (90 Base) MCG/ACT inhaler Inhale 2 puffs into the lungs every 6 (six) hours as needed for wheezing or shortness of breath. 8 g 0  . amLODipine (NORVASC) 10 MG tablet Take 1 tablet (10 mg total) by mouth daily. 90 tablet 2  . aspirin EC 81 MG tablet Take 81 mg by mouth daily.    . B Complex-C (B-COMPLEX WITH VITAMIN C) tablet Take 1 tablet by mouth daily.    . Bempedoic Acid (NEXLETOL) 180 MG TABS Take 1 tablet (180 mg total) by mouth daily. 30 tablet 11  . bisoprolol (ZEBETA) 5 MG tablet Take 0.5 tablets (2.5 mg total) by mouth daily. 45 tablet 1  . calcitRIOL (ROCALTROL) 0.25 MCG capsule TAKE 3 CAPSULES BY MOUTH DAILY 300 capsule 3  . calcium carbonate (TUMS) 500 MG chewable tablet Chew 2 tablets (400 mg of elemental calcium  total) by mouth 2 (two) times daily.    . Cholecalciferol (VITAMIN D) 125 MCG (5000 UT) CAPS Take 5,000 Units by mouth daily in the afternoon. 100 capsule 3  . ciclopirox (PENLAC) 8 % solution Apply topically at bedtime. Apply over nail and surrounding skin. Apply daily over previous coat. After seven (7) days, may remove with alcohol and continue cycle. 6.6 mL 0  . clopidogrel (PLAVIX) 75 MG tablet Take 1 tablet (75 mg total) by mouth daily. 90 tablet 3  . furosemide (LASIX) 20 MG tablet TAKE 1 TABLET BY MOUTH ONCE  WEEKLY AS NEEDED 15 tablet 3  . hydrALAZINE (APRESOLINE) 10 MG tablet TAKE 1/2 TABLET (5 MG) BY MOUTH AS NEEDED FOR BP ABOVE 160 OR GREATER 30 tablet 6  . hydrocortisone (ANUSOL-HC) 2.5 % rectal cream Place 1 Application rectally 2 (two) times daily. 30 g 1  . isosorbide mononitrate (IMDUR) 60 MG 24 hr tablet TAKE 1 TABLET BY MOUTH DAILY 90 tablet 3  . levothyroxine (SYNTHROID) 112 MCG tablet Take 1 tablet (112 mcg total) by mouth 3 (three) times a week. 90 tablet 1  . Multiple Vitamin (MULTIVITAMIN WITH MINERALS) TABS tablet Take 1 tablet by mouth daily.    . nitroGLYCERIN (NITROSTAT) 0.4 MG SL tablet DISSOLVE 1 TABLET UNDER THE  TONGUE EVERY 5 MINUTES AS NEEDED FOR CHEST PAIN. MAX OF 3 TABLETS IN 15 MINUTES. CALL 911 IF PAIN  PERSISTS. 100 tablet 3  . pantoprazole (PROTONIX) 40 MG tablet TAKE 1 TABLET BY MOUTH DAILY 90 tablet 3  . REPATHA SURECLICK 140 MG/ML SOAJ INJECT 140MG  SUBCUTANEOUSLY  EVERY 2 WEEKS 6 mL 3  . rosuvastatin (CRESTOR) 20 MG tablet Take 1 tablet (20 mg total) by mouth daily. 90 tablet 3  . urea (CARMOL) 10 % cream Apply topically as needed. 71 g 0   No facility-administered medications prior to visit.    Allergies  Allergen Reactions  . Zetia [Ezetimibe] Other (See Comments)    Myalgia, paresthesias and weakness  . Penicillin G Rash  Review of Systems  Constitutional:  Negative for fever and malaise/fatigue.  HENT:  Negative for congestion.   Eyes:   Negative for blurred vision.  Respiratory:  Negative for shortness of breath.   Cardiovascular:  Negative for chest pain, palpitations and leg swelling.  Gastrointestinal:  Positive for constipation. Negative for abdominal pain, blood in stool and nausea.  Genitourinary:  Negative for dysuria and frequency.  Musculoskeletal:  Positive for back pain and neck pain. Negative for falls.  Skin:  Negative for rash.  Neurological:  Negative for dizziness, loss of consciousness and headaches.  Endo/Heme/Allergies:  Negative for environmental allergies.  Psychiatric/Behavioral:  Negative for depression. The patient is not nervous/anxious.       Objective:    Physical Exam Constitutional:      General: She is not in acute distress.    Appearance: Normal appearance. She is well-developed. She is not ill-appearing or toxic-appearing.  HENT:     Head: Normocephalic and atraumatic.     Right Ear: External ear normal.     Left Ear: External ear normal.     Nose: Nose normal.  Eyes:     General:        Right eye: No discharge.        Left eye: No discharge.     Conjunctiva/sclera: Conjunctivae normal.  Neck:     Thyroid: No thyromegaly.  Cardiovascular:     Rate and Rhythm: Normal rate and regular rhythm.     Heart sounds: Normal heart sounds. No murmur heard. Pulmonary:     Effort: Pulmonary effort is normal. No respiratory distress.     Breath sounds: Normal breath sounds.  Abdominal:     General: Bowel sounds are normal.     Palpations: Abdomen is soft.     Tenderness: There is no abdominal tenderness. There is no guarding.  Musculoskeletal:        General: Normal range of motion.     Cervical back: Neck supple.  Lymphadenopathy:     Cervical: No cervical adenopathy.  Skin:    General: Skin is warm and dry.     Findings: No rash.  Neurological:     Mental Status: She is alert and oriented to person, place, and time.  Psychiatric:        Mood and Affect: Mood normal.         Behavior: Behavior normal.        Thought Content: Thought content normal.        Judgment: Judgment normal.   Ht 5\' 5"  (1.651 m) Comment: Patient stated  Wt 190 lb (86.2 kg) Comment: Patient stated  BMI 31.62 kg/m  Wt Readings from Last 3 Encounters:  01/21/24 190 lb (86.2 kg)  01/20/24 193 lb 3.2 oz (87.6 kg)  01/07/24 193 lb 6.4 oz (87.7 kg)       Assessment & Plan:  Acquired hypothyroidism Assessment & Plan: On Levothyroxine, continue to monitor   Orders: -     TSH; Future  Chronic constipation Assessment & Plan: Encouraged increased hydration and fiber in diet. Daily probiotics. If bowels not moving can use MOM 2 tbls po in 4 oz of warm prune juice by mouth every 2-3 days. If no results then repeat in 4 hours with  Dulcolax suppository pr, may repeat again in 4 more hours as needed. Seek care if symptoms worsen. Consider daily Miralax and/or Dulcolax if symptoms persist.    Essential hypertension, benign Assessment & Plan: Monitor and report any  concerns, no changes to meds. Encouraged heart healthy diet such as the DASH diet and exercise as tolerated.    Orders: -     CBC with Differential/Platelet; Future -     Comprehensive metabolic panel; Future  Mixed hyperlipidemia Assessment & Plan: Encourage heart healthy diet such as MIND or DASH diet, increase exercise, avoid trans fats, simple carbohydrates and processed foods, consider a krill or fish or flaxseed oil cap daily.  Tolerating Rosuvastatin  Orders: -     Lipid panel; Future  Hypocalcemia Assessment & Plan: Supplement and monitor   Orders: -     Comprehensive metabolic panel; Future -     Parathyroid hormone, intact (no Ca); Future  Neck pain on right side -     Ambulatory referral to Physical Therapy  Degeneration of intervertebral disc of lumbar region, unspecified whether pain present -     Ambulatory referral to Physical Therapy  Iron deficiency anemia due to chronic blood loss  Zinc  deficiency -     Zinc; Future     Assessment and Plan    Neck and Low Back Pain Reports improvement with physical therapy and dry needling. Currently experiencing worsening symptoms. -Refer back to physical therapy for further treatment.  Recent Illness Reports recent flu-like symptoms, now resolved after treatment by Melissa. -No further action required at this time.  General Health Maintenance Upcoming colonoscopy scheduled. Previous low calcium levels, now improved. -Order comprehensive blood work including thyroid, cholesterol, metabolic panel, zinc, complete blood count, and parathyroid hormone (PTH). -Schedule lab appointment in the next week or two. -Schedule virtual follow-up appointment in 8-12 weeks to discuss lab results. -Schedule in-person physical appointment in approximately 6 months.         I discussed the assessment and treatment plan with the patient. The patient was provided an opportunity to ask questions and all were answered. The patient agreed with the plan and demonstrated an understanding of the instructions.   The patient was advised to call back or seek an in-person evaluation if the symptoms worsen or if the condition fails to improve as anticipated.  Danise Edge, MD [facility] at [facility] [phone] (phone) [phone] (fax)  [facility]

## 2024-01-28 ENCOUNTER — Other Ambulatory Visit (INDEPENDENT_AMBULATORY_CARE_PROVIDER_SITE_OTHER): Payer: 59

## 2024-01-28 ENCOUNTER — Encounter: Payer: Self-pay | Admitting: Family Medicine

## 2024-01-28 DIAGNOSIS — I10 Essential (primary) hypertension: Secondary | ICD-10-CM | POA: Diagnosis not present

## 2024-01-28 DIAGNOSIS — E60 Dietary zinc deficiency: Secondary | ICD-10-CM | POA: Diagnosis not present

## 2024-01-28 DIAGNOSIS — E782 Mixed hyperlipidemia: Secondary | ICD-10-CM

## 2024-01-28 DIAGNOSIS — E039 Hypothyroidism, unspecified: Secondary | ICD-10-CM | POA: Diagnosis not present

## 2024-01-28 LAB — CBC WITH DIFFERENTIAL/PLATELET
Basophils Absolute: 0.1 10*3/uL (ref 0.0–0.1)
Basophils Relative: 0.8 % (ref 0.0–3.0)
Eosinophils Absolute: 0.4 10*3/uL (ref 0.0–0.7)
Eosinophils Relative: 4.7 % (ref 0.0–5.0)
HCT: 41.4 % (ref 36.0–46.0)
Hemoglobin: 13.6 g/dL (ref 12.0–15.0)
Lymphocytes Relative: 34.5 % (ref 12.0–46.0)
Lymphs Abs: 2.6 10*3/uL (ref 0.7–4.0)
MCHC: 32.9 g/dL (ref 30.0–36.0)
MCV: 86.9 fL (ref 78.0–100.0)
Monocytes Absolute: 0.6 10*3/uL (ref 0.1–1.0)
Monocytes Relative: 7.8 % (ref 3.0–12.0)
Neutro Abs: 3.9 10*3/uL (ref 1.4–7.7)
Neutrophils Relative %: 52.2 % (ref 43.0–77.0)
Platelets: 249 10*3/uL (ref 150.0–400.0)
RBC: 4.77 Mil/uL (ref 3.87–5.11)
RDW: 15.5 % (ref 11.5–15.5)
WBC: 7.5 10*3/uL (ref 4.0–10.5)

## 2024-01-28 LAB — LIPID PANEL
Cholesterol: 137 mg/dL (ref 0–200)
HDL: 50.2 mg/dL (ref >39.00)
LDL Cholesterol: 51 mg/dL (ref 0–99)
NonHDL: 86.87
Total CHOL/HDL Ratio: 3
Triglycerides: 181 mg/dL — ABNORMAL HIGH (ref 0.0–149.0)
VLDL: 36.2 mg/dL (ref 0.0–40.0)

## 2024-01-28 LAB — COMPREHENSIVE METABOLIC PANEL
ALT: 13 U/L (ref 0–35)
AST: 17 U/L (ref 0–37)
Albumin: 4 g/dL (ref 3.5–5.2)
Alkaline Phosphatase: 88 U/L (ref 39–117)
BUN: 22 mg/dL (ref 6–23)
CO2: 30 meq/L (ref 19–32)
Calcium: 8.1 mg/dL — ABNORMAL LOW (ref 8.4–10.5)
Chloride: 102 meq/L (ref 96–112)
Creatinine, Ser: 1.08 mg/dL (ref 0.40–1.20)
GFR: 49.75 mL/min — ABNORMAL LOW (ref >60.00)
Glucose, Bld: 85 mg/dL (ref 70–99)
Potassium: 4.1 meq/L (ref 3.5–5.1)
Sodium: 143 meq/L (ref 135–145)
Total Bilirubin: 0.5 mg/dL (ref 0.2–1.2)
Total Protein: 7.1 g/dL (ref 6.0–8.3)

## 2024-01-28 LAB — TSH: TSH: 2.44 u[IU]/mL (ref 0.35–5.50)

## 2024-01-29 ENCOUNTER — Other Ambulatory Visit: Payer: Self-pay | Admitting: Emergency Medicine

## 2024-01-29 DIAGNOSIS — I10 Essential (primary) hypertension: Secondary | ICD-10-CM

## 2024-01-29 NOTE — Telephone Encounter (Signed)
Called patient. Results given per provider's note. Lab appointment was made for repeat CMP

## 2024-02-01 LAB — PARATHYROID HORMONE, INTACT (NO CA): PTH: 6 pg/mL — ABNORMAL LOW (ref 16–77)

## 2024-02-01 LAB — ZINC: Zinc: 76 ug/dL (ref 60–130)

## 2024-02-04 ENCOUNTER — Ambulatory Visit: Payer: 59 | Attending: Family Medicine

## 2024-02-04 ENCOUNTER — Other Ambulatory Visit: Payer: Self-pay

## 2024-02-04 DIAGNOSIS — M436 Torticollis: Secondary | ICD-10-CM | POA: Diagnosis not present

## 2024-02-04 DIAGNOSIS — M542 Cervicalgia: Secondary | ICD-10-CM | POA: Diagnosis not present

## 2024-02-04 DIAGNOSIS — M25652 Stiffness of left hip, not elsewhere classified: Secondary | ICD-10-CM | POA: Insufficient documentation

## 2024-02-04 DIAGNOSIS — M25651 Stiffness of right hip, not elsewhere classified: Secondary | ICD-10-CM | POA: Diagnosis not present

## 2024-02-04 DIAGNOSIS — M5441 Lumbago with sciatica, right side: Secondary | ICD-10-CM | POA: Diagnosis not present

## 2024-02-04 DIAGNOSIS — R293 Abnormal posture: Secondary | ICD-10-CM | POA: Insufficient documentation

## 2024-02-04 DIAGNOSIS — G8929 Other chronic pain: Secondary | ICD-10-CM | POA: Diagnosis not present

## 2024-02-04 DIAGNOSIS — M2560 Stiffness of unspecified joint, not elsewhere classified: Secondary | ICD-10-CM | POA: Insufficient documentation

## 2024-02-04 DIAGNOSIS — M51369 Other intervertebral disc degeneration, lumbar region without mention of lumbar back pain or lower extremity pain: Secondary | ICD-10-CM | POA: Diagnosis not present

## 2024-02-04 NOTE — Progress Notes (Unsigned)
 [name] [address] [place] [address] Phone: [phone] Subjective:   [name], am serving as a scribe for Dr. Antoine Primas.  I'm seeing this patient by the request  of:  Bradd Canary, MD  CC: Right first toe pain  [id number]  11/05/2023 At the moment no significant swelling but still seems to be giving some difficulty.  Does have known arthritic changes that we need to continue to monitor as well.  Patient has not been wearing the sandals as regularly but will try to do that.  We did discuss different medications with at the moment we will hold on that.  Patient was more concerned with the dry skin than truly anything else.  We discussed and recommended over-the-counter stuff.  Does have some onychomycosis noted.      Update 02/05/2024 [name] is a 78 y.o. female coming in with complaint of R 1st MTP joint pain. Patient states that pressure against joint is painful.       Past Medical History:  Diagnosis Date   Acute MI, subendocardial (HCC)    Arthritis    Back pain 05/31/2013   Benign paroxysmal positional vertigo 08/05/2016   CAD (coronary artery disease)    a. NSTEMI 10/2011: LHC 11/19/11: pLAD 30%, oOM 60%, AVCFX 30%, CFX after OM2 30%, pRCA 60 and 70%, then 99%, AM 80-90% with TIMI 3 flow.  PCI: Promus DES x 2 to RCA; b. 06/2012 Cath: patent RCA stents w/ subtl occl of Acute Marginal (jailed)->Med rx; c. 05/2015 MV: EF 59%, mod mid infsept/inf/ap lat/ap infarct with peri-infarct isch-->Med Rx; d. 02/2016 Cath: LM nl, LAD 17m, RI 50, RCA patent stents.   Colitis    Facial skin lesion 01/28/2017   GERD (gastroesophageal reflux disease)    occasional   Hyperlipidemia    Hypertension    Hypertensive heart disease    a. Echocardiogram 11/19/11: Difficult acoustic windows, EF 60-65%, normal LV wall thickness, grade 1 diastolic dysfunction   Hypoparathyroidism (HCC)    Low zinc level 11/26/2016   Multinodular thyroid     Goiter s/p thyroidectomy in 2007 with post-op  hypocalcemia and post-op hypothyroidism/hypoparathyroidism with hypocalcemia   Neck pain on right side 07/13/2013   Nocturia 05/31/2013   Osteoarthritis    Pain in right axilla 03/13/2016   Palpitations    a. 03/2012 - patient set up for event monitor but did not wear correctly -  she declined wearing a repeat monitor   Post-surgical hypothyroidism    Sun-damaged skin 12/05/2014   Tubular adenoma of colon 06/2011   Unspecified constipation 05/31/2013   Vertigo    Zinc deficiency 11/26/2015   Past Surgical History:  Procedure Laterality Date   CARDIAC CATHETERIZATION N/A 03/08/2016   Procedure: Left Heart Cath and Coronary Angiography;  Surgeon: Corky Crafts, MD;  Location: [facility] INVASIVE CV LAB;  Service: Cardiovascular;  Laterality: N/A;   COLONOSCOPY  Aenomatous polyps   07/05/2011   COLONOSCOPY N/A 09/28/2014   Procedure: COLONOSCOPY;  Surgeon: Meryl Dare, MD;  Location: [facility] ENDOSCOPY;  Service: Endoscopy;  Laterality: N/A;   CORONARY ANGIOPLASTY WITH STENT PLACEMENT     LEFT HEART CATH AND CORONARY ANGIOGRAPHY N/A 12/09/2018   Procedure: LEFT HEART CATH AND CORONARY ANGIOGRAPHY;  Surgeon: Corky Crafts, MD;  Location: [facility] INVASIVE CV LAB;  Service: Cardiovascular;  Laterality: N/A;   LEFT HEART CATHETERIZATION WITH CORONARY ANGIOGRAM N/A 07/17/2012   Procedure: LEFT HEART CATHETERIZATION WITH CORONARY  ANGIOGRAM;  Surgeon: Herby Abraham, MD;  Location: [facility] CATH LAB;  Service: Cardiovascular;  Laterality: N/A;   PERCUTANEOUS CORONARY STENT INTERVENTION (PCI-S) N/A 11/19/2011   Procedure: PERCUTANEOUS CORONARY STENT INTERVENTION (PCI-S);  Surgeon: Herby Abraham, MD;  Location: [facility] CATH LAB;  Service: Cardiovascular;  Laterality: N/A;   POLYPECTOMY     SHOULDER ARTHROSCOPY W/ ROTATOR CUFF REPAIR     right   TOTAL THYROIDECTOMY  2007   GOITER   Social History   Socioeconomic History   Marital status: Married    Spouse name:  Not on file   Number of children: 4   Years of education: 14   Highest education level: Not on file  Occupational History   Occupation: Sales person at [employer]  Tobacco Use   Smoking status: Never   Smokeless tobacco: Never  Vaping Use   Vaping status: Never Used  Substance and Sexual Activity   Alcohol use: Not Currently   Drug use: No   Sexual activity: Not Currently  Other Topics Concern   Not on file  Social History Narrative   Lives at home alone.  Her son lives near her.   Right-handed.   1 cup coffee per day.   Social Drivers of Corporate investment banker Strain: Low Risk  (05/07/2022)   Overall Financial Resource Strain (CARDIA)    Difficulty of Paying Living Expenses: Not hard at all  Food Insecurity: No Food Insecurity (06/05/2023)   Hunger Vital Sign    Worried About Running Out of Food in the Last Year: Never true    Ran Out of Food in the Last Year: Never true  Transportation Needs: No Transportation Needs (06/05/2023)   PRAPARE - Administrator, Civil Service (Medical): No    Lack of Transportation (Non-Medical): No  Physical Activity: Sufficiently Active (05/07/2022)   Exercise Vital Sign    Days of Exercise per Week: 7 days    Minutes of Exercise per Session: 30 min  Stress: No Stress Concern Present (05/07/2022)   [employer] of Occupational Health - Occupational Stress Questionnaire    Feeling of Stress : Not at all  Social Connections: Unknown (08/22/2022)   Received from [employer], [facility]   Social Network    Social Network: Not on file   Allergies  Allergen Reactions   Zetia [Ezetimibe] Other (See Comments)    Myalgia, paresthesias and weakness   Penicillin G Rash   Family History  Problem Relation Age of Onset   Colon cancer Brother    Cancer Brother        COLON   Hypertension Father    Heart disease Father    Heart attack Father    Blindness Sister    Congestive Heart Failure Sister    Hypertension Sister     Healthy Daughter    Healthy Son    Thyroid disease Sister    Cancer Brother        multiple myelomas   Healthy Daughter    Healthy Daughter    Diabetes Neg Hx    Prostate cancer Neg Hx    Breast cancer Neg Hx    Esophageal cancer Neg Hx    Rectal cancer Neg Hx    Stomach cancer Neg Hx     Current Outpatient Medications (Endocrine & Metabolic):    calcitRIOL (ROCALTROL) 0.25 MCG capsule, TAKE 3 CAPSULES BY MOUTH DAILY   levothyroxine (SYNTHROID) 112 MCG tablet, Take 1 tablet (112 mcg total)  by mouth 3 (three) times a week.  Current Outpatient Medications (Cardiovascular):    amLODipine (NORVASC) 10 MG tablet, Take 1 tablet (10 mg total) by mouth daily.   Bempedoic Acid (NEXLETOL) 180 MG TABS, Take 1 tablet (180 mg total) by mouth daily.   bisoprolol (ZEBETA) 5 MG tablet, Take 0.5 tablets (2.5 mg total) by mouth daily.   furosemide (LASIX) 20 MG tablet, TAKE 1 TABLET BY MOUTH ONCE  WEEKLY AS NEEDED   hydrALAZINE (APRESOLINE) 10 MG tablet, TAKE 1/2 TABLET (5 MG) BY MOUTH AS NEEDED FOR BP ABOVE 160 OR GREATER   isosorbide mononitrate (IMDUR) 60 MG 24 hr tablet, TAKE 1 TABLET BY MOUTH DAILY   nitroGLYCERIN (NITROSTAT) 0.4 MG SL tablet, DISSOLVE 1 TABLET UNDER THE  TONGUE EVERY 5 MINUTES AS NEEDED FOR CHEST PAIN. MAX OF 3 TABLETS IN 15 MINUTES. CALL 911 IF PAIN  PERSISTS.   REPATHA SURECLICK 140 MG/ML SOAJ, INJECT 140MG  SUBCUTANEOUSLY  EVERY 2 WEEKS   rosuvastatin (CRESTOR) 20 MG tablet, Take 1 tablet (20 mg total) by mouth daily.  Current Outpatient Medications (Respiratory):    albuterol (VENTOLIN HFA) 108 (90 Base) MCG/ACT inhaler, Inhale 2 puffs into the lungs every 6 (six) hours as needed for wheezing or shortness of breath.  Current Outpatient Medications (Analgesics):    aspirin EC 81 MG tablet, Take 81 mg by mouth daily.  Current Outpatient Medications (Hematological):    clopidogrel (PLAVIX) 75 MG tablet, Take 1 tablet (75 mg total) by mouth daily.  Current Outpatient  Medications (Other):    B Complex-C (B-COMPLEX WITH VITAMIN C) tablet, Take 1 tablet by mouth daily.   calcium carbonate (TUMS) 500 MG chewable tablet, Chew 2 tablets (400 mg of elemental calcium total) by mouth 2 (two) times daily.   Cholecalciferol (VITAMIN D) 125 MCG (5000 UT) CAPS, Take 5,000 Units by mouth daily in the afternoon.   ciclopirox (PENLAC) 8 % solution, Apply topically at bedtime. Apply over nail and surrounding skin. Apply daily over previous coat. After seven (7) days, may remove with alcohol and continue cycle.   hydrocortisone (ANUSOL-HC) 2.5 % rectal cream, Place 1 Application rectally 2 (two) times daily.   Multiple Vitamin (MULTIVITAMIN WITH MINERALS) TABS tablet, Take 1 tablet by mouth daily.   pantoprazole (PROTONIX) 40 MG tablet, TAKE 1 TABLET BY MOUTH DAILY   urea (CARMOL) 10 % cream, Apply topically as needed.   Reviewed prior external information including notes and imaging from  primary care provider As well as notes that were available from care everywhere and other healthcare systems.  Past medical history, social, surgical and family history all reviewed in electronic medical record.  No pertanent information unless stated regarding to the chief complaint.   Review of Systems:  No headache, visual changes, nausea, vomiting, diarrhea, constipation, dizziness, abdominal pain, skin rash, fevers, chills, night sweats, weight loss, swollen lymph nodes, body aches, joint swelling, chest pain, shortness of breath, mood changes. POSITIVE muscle aches  Objective  Blood pressure 128/60, pulse (!) 57, height 5\' 5"  (1.651 m), SpO2 96%.   General: No apparent distress alert and oriented x3 mood and affect normal, dressed appropriately.  HEENT: Pupils equal, extraocular movements intact  Respiratory: Patient's speak in full sentences and does not appear short of breath  Cardiovascular: No lower extremity edema, non tender, no erythema  Foot exam shows the patient does  have mild breakdown of the transverse arch noted.  Patient does have bunion and bunionette formation is noted.  Tender to  palpation over the bunion itself.  Relatively good range of motion no otherwise noted of the first MTP.  Limited muscular skeletal ultrasound was performed and interpreted by Antoine Primas, M   Limited ultrasound shows that patient does have no significant arthritic changes in the [facility] joint.  Patient does have some increase in Doppler flow over the bunion itself.  No cortical irregularities are otherwise noted. Impression: Regular CMTjoint     Impression and Recommendations:     The above documentation has been reviewed and is accurate and complete Judi Saa, DO

## 2024-02-04 NOTE — Therapy (Signed)
OUTPATIENT PHYSICAL THERAPY THORACOLUMBAR EVALUATION   Patient Name: [name] MRN: [id number] DOB:[birth year], 78 y.o., female Today's Date: 02/05/2024  END OF SESSION:  PT End of Session - 02/04/24 1557     Visit Number 1    Date for PT Re-Evaluation 04/28/24    Authorization Type [employer] Medicare    Progress Note Due on Visit 10    PT Start Time 1315    PT Stop Time 1400    PT Time Calculation (min) 45 min    Activity Tolerance Other (comment)    Behavior During Therapy WFL for tasks assessed/performed             Past Medical History:  Diagnosis Date   Acute MI, subendocardial (HCC)    Arthritis    Back pain 05/31/2013   Benign paroxysmal positional vertigo 08/05/2016   CAD (coronary artery disease)    a. NSTEMI 10/2011: LHC 11/19/11: pLAD 30%, oOM 60%, AVCFX 30%, CFX after OM2 30%, pRCA 60 and 70%, then 99%, AM 80-90% with TIMI 3 flow.  PCI: Promus DES x 2 to RCA; b. 06/2012 Cath: patent RCA stents w/ subtl occl of Acute Marginal (jailed)->Med rx; c. 05/2015 MV: EF 59%, mod mid infsept/inf/ap lat/ap infarct with peri-infarct isch-->Med Rx; d. 02/2016 Cath: LM nl, LAD 90m, RI 50, RCA patent stents.   Colitis    Facial skin lesion 01/28/2017   GERD (gastroesophageal reflux disease)    occasional   Hyperlipidemia    Hypertension    Hypertensive heart disease    a. Echocardiogram 11/19/11: Difficult acoustic windows, EF 60-65%, normal LV wall thickness, grade 1 diastolic dysfunction   Hypoparathyroidism (HCC)    Low zinc level 11/26/2016   Multinodular thyroid    Goiter s/p thyroidectomy in 2007 with post-op  hypocalcemia and post-op hypothyroidism/hypoparathyroidism with hypocalcemia   Neck pain on right side 07/13/2013   Nocturia 05/31/2013   Osteoarthritis    Pain in right axilla 03/13/2016   Palpitations    a. 03/2012 - patient set up for event monitor but did not wear correctly -  she declined wearing a repeat monitor   Post-surgical hypothyroidism    Sun-damaged skin  12/05/2014   Tubular adenoma of colon 06/2011   Unspecified constipation 05/31/2013   Vertigo    Zinc deficiency 11/26/2015   Past Surgical History:  Procedure Laterality Date   CARDIAC CATHETERIZATION N/A 03/08/2016   Procedure: Left Heart Cath and Coronary Angiography;  Surgeon: Corky Crafts, MD;  Location: [facility] INVASIVE CV LAB;  Service: Cardiovascular;  Laterality: N/A;   COLONOSCOPY  Aenomatous polyps   07/05/2011   COLONOSCOPY N/A 09/28/2014   Procedure: COLONOSCOPY;  Surgeon: Meryl Dare, MD;  Location: [facility]OS[facility];  Service: Endoscopy;  Laterality: N/A;   CORONARY ANGIOPLASTY WITH STENT PLACEMENT     LEFT HEART CATH AND CORONARY ANGIOGRAPHY N/A 12/09/2018   Procedure: LEFT HEART CATH AND CORONARY ANGIOGRAPHY;  Surgeon: Corky Crafts, MD;  Location: [facility] INVASIVE CV LAB;  Service: Cardiovascular;  Laterality: N/A;   LEFT HEART CATHETERIZATION WITH CORONARY ANGIOGRAM N/A 07/17/2012   Procedure: LEFT HEART CATHETERIZATION WITH CORONARY ANGIOGRAM;  Surgeon: Herby Abraham, MD;  Location: [facility] CATH LAB;  Service: Cardiovascular;  Laterality: N/A;   PERCUTANEOUS CORONARY STENT INTERVENTION (PCI-S) N/A 11/19/2011   Procedure: PERCUTANEOUS CORONARY STENT INTERVENTION (PCI-S);  Surgeon: Herby Abraham, MD;  Location: [facility] CATH LAB;  Service: Cardiovascular;  Laterality: N/A;   POLYPECTOMY     SHOULDER ARTHROSCOPY W/ ROTATOR CUFF  REPAIR     right   TOTAL THYROIDECTOMY  2007   GOITER   Patient Active Problem List   Diagnosis Date Noted   Bronchitis 01/07/2024   Onychomycosis 11/05/2023   CRI (chronic renal insufficiency) 07/16/2023   Arthritis of left midfoot 06/11/2023   Sinus tarsi syndrome of left ankle 04/03/2023   Sinus tarsi syndrome of right ankle 02/18/2023   Edema 02/17/2023   Acute prerenal azotemia 02/06/2023   TIA (transient ischemic attack) 02/05/2023   Paresthesia 08/28/2022   Chronic neck pain 08/28/2022   Neuropathy 08/21/2022   Vitamin deficiency  10/23/2021   Hypoglycemia 10/07/2019   Heart disease 01/29/2019   Sensorineural hearing loss (SNHL) of both ears 01/27/2019   Primary osteoarthritis of both knees 12/19/2018   DDD (degenerative disc disease), lumbar 12/19/2018   ANA positive 10/27/2018   Anxiety 02/17/2018   Peripheral arterial disease (HCC) 02/20/2017   Skin lesion of face 01/28/2017   Benign paroxysmal positional vertigo 08/05/2016   Antiplatelet or antithrombotic long-term use 03/27/2016   Right lumbar radiculopathy 02/21/2016   Abnormality of gait 02/21/2016   Zinc deficiency 11/26/2015   Right hip pain 10/26/2015   Medicare annual wellness visit, subsequent 03/13/2015   Preventative health care 03/13/2015   Sun-damaged skin 12/05/2014   Angina of effort (HCC) 12/02/2014   Hx of colonic polyps 09/28/2014   Benign neoplasm of descending colon 09/28/2014   Other fatigue 09/10/2014   History of colonic polyps 07/16/2014   Bilateral thumb pain 03/20/2014   Postsurgical hypoparathyroidism (HCC) 08/03/2013   Neck pain on right side 07/13/2013   Chronic constipation 05/31/2013   Back pain 05/31/2013   Nocturia 05/31/2013   GERD (gastroesophageal reflux disease) 11/02/2012   Tension headache 11/02/2012   Anemia 06/11/2012   Hypokalemia 06/11/2012   Depression 01/21/2012   Headache 12/23/2011   Sinus bradycardia 12/07/2011   Hypocalcemia 12/07/2011   Acute MI, subendocardial (HCC) 11/20/2011   Acquired hypothyroidism 05/20/2009   Essential hypertension, benign 05/20/2009   Hyperlipidemia 03/08/2009   CAD, NATIVE VESSEL 03/08/2009    PCP: Danise Edge, MD  REFERRING PROVIDER: same  REFERRING DIAG: chronic cervical spine pain and chronic lower back pain  Rationale for Evaluation and Treatment: Rehabilitation  THERAPY DIAG:  Joint stiffness of spine  Cervicalgia  Stiffness of left hip, not elsewhere classified  Stiffness of right hip, not elsewhere classified  Neck stiffness  ONSET DATE:  chronic, years  SUBJECTIVE:  SUBJECTIVE STATEMENT: I had some kind of respiratory thing and became very sedentary over the last couple of months.  My back and my neck have gotten much stiffer/painful since I havent been able to be as active.  PERTINENT HISTORY:  Chronic history of neck and lower back pain, used to get injections lower back but has stopped in the last couple of years.  PAIN:  Are you having pain? Yes: NPRS scale: 0 to 5 Pain location: B SI region and post hips/ gluts,B upper traps Pain description: aching all the time, stiff, painful to touch Aggravating factors: prolonged sitting  Relieving factors: gentle movement seems to help   PRECAUTIONS: None  RED FLAGS: None   WEIGHT BEARING RESTRICTIONS: No  FALLS:  Has patient fallen in last 6 months? No  LIVING ENVIRONMENT: Lives with: lives alone Lives in: House/apartment Stairs: No Has following equipment at home: None  OCCUPATION: retired   PLOF: Independent  PATIENT GOALS: To be able to garden in the spring, be able to travel with my children  NEXT MD VISIT: 2 months  OBJECTIVE:  Note: Objective measures were completed at Evaluation unless otherwise noted.  DIAGNOSTIC FINDINGS:  None recently  PATIENT SURVEYS:  Modified Oswestry Modified Oswestry Low Back Pain Disability Questionnaire: 17 / 50 = 34.0 %  NDI TBD  COGNITION: Overall cognitive status: Within functional limits for tasks assessed     SENSATION: WFL  MUSCLE LENGTH: Hamstrings: Right wnl deg; Left wnl deg Maisie Fus test: Right nt deg; Left nt deg  POSTURE: wider base of support, mild forward head posture   PALPATION: Very tender B gluteals, greater on R than L  LUMBAR ROM:   AROM eval  Flexion 70   Extension wfl  Right lateral flexion 35  Left  lateral flexion 35  Right rotation   Left rotation    (Blank rows = not tested) CERVICAL ROM: Flexion 60, ext 50, rotation L 65, rotation R 50 LOWER EXTREMITY ROM:     Passive  Right eval Left eval  Hip flexion 90 90  Hip extension    Hip abduction    Hip adduction    Hip internal rotation    Hip external rotation 10 10  Knee flexion    Knee extension    Ankle dorsiflexion    Ankle plantarflexion    Ankle inversion    Ankle eversion     (Blank rows = not tested)  LOWER EXTREMITY MMT:  all LE MMT 4/5 UE MMT: all UE MMT 4/5 LUMBAR SPECIAL TESTS:  Straight leg raise test: Negative, Slump test: Negative, and FABER test: Positive  FUNCTIONAL TESTS:  30 seconds chair stand test  GAIT: Distance walked: in clinic up to 90', now device  TREATMENT DATE: 02/04/24:  Evaluation, mobilization with movement  R hip 3 bouts of R hip flexion with lateral glides R femoral head Side lying L for cross friction massage R lateral hip with theragun  Supine with legs over physioball for LTR and B knee to chest.  PATIENT EDUCATION:  Education details: POC, goals Person educated: Patient Education method: Explanation, Demonstration, Tactile cues, and Verbal cues Education comprehension: verbalized understanding, returned demonstration, verbal cues required, and tactile cues required  HOME EXERCISE PROGRAM:TBD  ASSESSMENT:  CLINICAL IMPRESSION: Patient is a 78 y.o. female who was evaluated  today by physical therapy due to an exacerbation of chronic neck and lower back pain.  She is well known to this clinic.  She is typically very active, motivated, lives alone, community ambulator, travels with her children, maintains a garden.  She presents today with stiffness pronounced lower cervical spine and upper traps and B post/lat hips.  She is unable to tolerate much manually  for cervical region due to chronic vertigo due to unable to tolerate the positions. Focused today on R hip stiffness, although both hips stiff.  She tolerated well.  Should benefit from skilled PT with combination of manual techniques, directed therex to improve her mobility, pain, activity tolerance so that she can maintain her I.  OBJECTIVE IMPAIRMENTS: decreased activity tolerance, decreased endurance, decreased mobility, decreased ROM, decreased strength, dizziness, hypomobility, increased fascial restrictions, impaired flexibility, impaired UE functional use, and pain.   ACTIVITY LIMITATIONS: lifting, bending, squatting, sleeping, transfers, bed mobility, reach over head, and caring for others  PARTICIPATION LIMITATIONS: cleaning, laundry, shopping, community activity, and yard work  PERSONAL FACTORS: Age, Behavior pattern, Fitness, Past/current experiences, and 1-2 comorbidities: h/o MI, angina, chronic vertigo  are also affecting patient's functional outcome.   REHAB POTENTIAL: Good  CLINICAL DECISION MAKING: Evolving/moderate complexity  EVALUATION COMPLEXITY: Moderate   GOALS: Goals reviewed with patient? Yes  SHORT TERM GOALS: Target date: 2 weeks   Establish initial HEP and pt I Baseline:TBD Goal status: INITIAL   LONG TERM GOALS: Target date: 12 weeks, 04/28/24  Modified Oswestry Low Back Pain Disability Questionnaire: 17 / 50 = 34.0 %  Baseline:  Goal status: INITIAL  2.  NDI 15% disability or less Baseline: TBD Goal status: INITIAL  3.  Improve ROM B hips to greater than 105 degrees flexion for better ease of movement for transfers, squatting, reaching activities Baseline: 90 degrees Goal status: INITIAL  4.  30 sec sit to stand , 12 reps Baseline: TBD Goal status: INITIAL   PLAN:  PT FREQUENCY: 1x/week  PT DURATION: 12 weeks  PLANNED INTERVENTIONS: 97110-Therapeutic exercises, 97530- Therapeutic activity, 97112- Neuromuscular re-education, 97535- Self  Care, and [address]- Manual therapy.  PLAN FOR NEXT SESSION: Reassess LS and hip , c spine ROM, resume periscapular strengthening to improve lower cervical spine mobility and pain, may use TPDN as indicated, continue with manual techniques, therex to improve B hip and lumbar mobility as well.   Laporche Martelle L Naryah Clenney, PT, DPT, OCS 02/05/2024, 8:41 AM

## 2024-02-05 ENCOUNTER — Other Ambulatory Visit: Payer: Self-pay

## 2024-02-05 ENCOUNTER — Encounter: Payer: Self-pay | Admitting: Family Medicine

## 2024-02-05 ENCOUNTER — Ambulatory Visit (INDEPENDENT_AMBULATORY_CARE_PROVIDER_SITE_OTHER): Payer: 59 | Admitting: Family Medicine

## 2024-02-05 VITALS — BP 128/60 | HR 57 | Ht 65.0 in

## 2024-02-05 DIAGNOSIS — M216X9 Other acquired deformities of unspecified foot: Secondary | ICD-10-CM | POA: Diagnosis not present

## 2024-02-05 DIAGNOSIS — G8929 Other chronic pain: Secondary | ICD-10-CM

## 2024-02-05 DIAGNOSIS — M25571 Pain in right ankle and joints of right foot: Secondary | ICD-10-CM

## 2024-02-05 NOTE — Patient Instructions (Addendum)
[employer] Total Support Orthotics Original Metatarsal cookie to insole Exercises See me again in 2-3 months

## 2024-02-05 NOTE — Assessment & Plan Note (Signed)
Normal breakdown of the transverse arch following bunion and bunionette formation.  Discussed lacing shoe differently.  Discussed icing regimen and home exercises, discussed which activities to do and which ones to avoid.  Discussed proper shoes, over-the-counter orthotics.  Metatarsal pad added to which insole she is wearing today.  Follow-up again in 2 months to see how patient is responding.  May need custom orthotics.

## 2024-02-06 ENCOUNTER — Encounter: Payer: Self-pay | Admitting: Gastroenterology

## 2024-02-06 ENCOUNTER — Ambulatory Visit (AMBULATORY_SURGERY_CENTER): Payer: 59 | Admitting: Gastroenterology

## 2024-02-06 VITALS — BP 130/82 | HR 47 | Temp 97.6°F | Resp 12 | Ht 65.0 in | Wt 196.0 lb

## 2024-02-06 DIAGNOSIS — Z860100 Personal history of colon polyps, unspecified: Secondary | ICD-10-CM

## 2024-02-06 DIAGNOSIS — K5730 Diverticulosis of large intestine without perforation or abscess without bleeding: Secondary | ICD-10-CM

## 2024-02-06 DIAGNOSIS — Z860101 Personal history of adenomatous and serrated colon polyps: Secondary | ICD-10-CM

## 2024-02-06 DIAGNOSIS — Z1211 Encounter for screening for malignant neoplasm of colon: Secondary | ICD-10-CM | POA: Diagnosis not present

## 2024-02-06 MED ORDER — SODIUM CHLORIDE 0.9 % IV SOLN: 500.0000 mL | INTRAVENOUS | Status: DC

## 2024-02-06 NOTE — Progress Notes (Signed)
[facility] History and Physical   Primary Care Physician:  Bradd Canary, MD   Reason for Procedure:   History of colon polyps  Plan:    Colonoscopy     HPI: [name] is a 78 y.o. female undergoing surveillance colonoscopy.  He has no family history of colon cancer and no chronic GI symptoms. Her last colonoscopy was in March 2021 and three tubular adenomas were removed.  She takes Plavix for a history of CAD/stent, last dose 5 days ago.   Past Medical History:  Diagnosis Date   Acute MI, subendocardial (HCC)    Arthritis    Back pain 05/31/2013   Benign paroxysmal positional vertigo 08/05/2016   CAD (coronary artery disease)    a. NSTEMI 10/2011: LHC 11/19/11: pLAD 30%, oOM 60%, AVCFX 30%, CFX after OM2 30%, pRCA 60 and 70%, then 99%, AM 80-90% with TIMI 3 flow.  PCI: Promus DES x 2 to RCA; b. 06/2012 Cath: patent RCA stents w/ subtl occl of Acute Marginal (jailed)->Med rx; c. 05/2015 MV: EF 59%, mod mid infsept/inf/ap lat/ap infarct with peri-infarct isch-->Med Rx; d. 02/2016 Cath: LM nl, LAD 74m, RI 50, RCA patent stents.   Colitis    Facial skin lesion 01/28/2017   GERD (gastroesophageal reflux disease)    occasional   Hyperlipidemia    Hypertension    Hypertensive heart disease    a. Echocardiogram 11/19/11: Difficult acoustic windows, EF 60-65%, normal LV wall thickness, grade 1 diastolic dysfunction   Hypoparathyroidism (HCC)    Low zinc level 11/26/2016   Multinodular thyroid    Goiter s/p thyroidectomy in 2007 with post-op  hypocalcemia and post-op hypothyroidism/hypoparathyroidism with hypocalcemia   Neck pain on right side 07/13/2013   Nocturia 05/31/2013   Osteoarthritis    Pain in right axilla 03/13/2016   Palpitations    a. 03/2012 - patient set up for event monitor but did not wear correctly -  she declined wearing a repeat monitor   Post-surgical hypothyroidism    Sun-damaged skin 12/05/2014   Tubular adenoma of colon 06/2011   Unspecified  constipation 05/31/2013   Vertigo    Zinc deficiency 11/26/2015    Past Surgical History:  Procedure Laterality Date   CARDIAC CATHETERIZATION N/A 03/08/2016   Procedure: Left Heart Cath and Coronary Angiography;  Surgeon: Corky Crafts, MD;  Location: [facility] INVASIVE CV LAB;  Service: Cardiovascular;  Laterality: N/A;   COLONOSCOPY  Aenomatous polyps   07/05/2011   COLONOSCOPY N/A 09/28/2014   Procedure: COLONOSCOPY;  Surgeon: Meryl Dare, MD;  Location: [facility];  Service: Endoscopy;  Laterality: N/A;   CORONARY ANGIOPLASTY WITH STENT PLACEMENT     LEFT HEART CATH AND CORONARY ANGIOGRAPHY N/A 12/09/2018   Procedure: LEFT HEART CATH AND CORONARY ANGIOGRAPHY;  Surgeon: Corky Crafts, MD;  Location: [facility] INVASIVE CV LAB;  Service: Cardiovascular;  Laterality: N/A;   LEFT HEART CATHETERIZATION WITH CORONARY ANGIOGRAM N/A 07/17/2012   Procedure: LEFT HEART CATHETERIZATION WITH CORONARY ANGIOGRAM;  Surgeon: Herby Abraham, MD;  Location: [facility] CATH LAB;  Service: Cardiovascular;  Laterality: N/A;   PERCUTANEOUS CORONARY STENT INTERVENTION (PCI-S) N/A 11/19/2011   Procedure: PERCUTANEOUS CORONARY STENT INTERVENTION (PCI-S);  Surgeon: Herby Abraham, MD;  Location: [facility] CATH LAB;  Service: Cardiovascular;  Laterality: N/A;   POLYPECTOMY     SHOULDER ARTHROSCOPY W/ ROTATOR CUFF REPAIR     right   TOTAL THYROIDECTOMY  2007   GOITER    Prior to Admission medications  Medication Sig Start Date End Date Taking? Authorizing Provider  amLODipine (NORVASC) 10 MG tablet Take 1 tablet (10 mg total) by mouth daily. 10/25/23  Yes Pricilla Riffle, MD  aspirin EC 81 MG tablet Take 81 mg by mouth daily. 11/22/11  Yes Dunn, Dayna N, PA-C  Bempedoic Acid (NEXLETOL) 180 MG TABS Take 1 tablet (180 mg total) by mouth daily. 12/19/23  Yes Pricilla Riffle, MD  bisoprolol (ZEBETA) 5 MG tablet Take 0.5 tablets (2.5 mg total) by mouth daily. 11/12/23  Yes Pricilla Riffle, MD  Cholecalciferol (VITAMIN D) 125 MCG  (5000 UT) CAPS Take 5,000 Units by mouth daily in the afternoon. 01/17/23  Yes Pricilla Riffle, MD  isosorbide mononitrate (IMDUR) 60 MG 24 hr tablet TAKE 1 TABLET BY MOUTH DAILY 07/11/23  Yes Pricilla Riffle, MD  levothyroxine (SYNTHROID) 112 MCG tablet Take 1 tablet (112 mcg total) by mouth 3 (three) times a week. 12/11/23  Yes Sandford Craze, NP  Multiple Vitamin (MULTIVITAMIN WITH MINERALS) TABS tablet Take 1 tablet by mouth daily.   Yes [provider]  pantoprazole (PROTONIX) 40 MG tablet TAKE 1 TABLET BY MOUTH DAILY 06/13/23  Yes Bradd Canary, MD  rosuvastatin (CRESTOR) 20 MG tablet Take 1 tablet (20 mg total) by mouth daily. 12/19/23 03/18/24 Yes Pricilla Riffle, MD  albuterol (VENTOLIN HFA) 108 (90 Base) MCG/ACT inhaler Inhale 2 puffs into the lungs every 6 (six) hours as needed for wheezing or shortness of breath. 01/07/24   Sandford Craze, NP  B Complex-C (B-COMPLEX WITH VITAMIN C) tablet Take 1 tablet by mouth daily.    [provider]  calcitRIOL (ROCALTROL) 0.25 MCG capsule TAKE 3 CAPSULES BY MOUTH DAILY 08/20/23   Pricilla Riffle, MD  calcium carbonate (TUMS) 500 MG chewable tablet Chew 2 tablets (400 mg of elemental calcium total) by mouth 2 (two) times daily. 01/15/24   Sandford Craze, NP  ciclopirox (PENLAC) 8 % solution Apply topically at bedtime. Apply over nail and surrounding skin. Apply daily over previous coat. After seven (7) days, may remove with alcohol and continue cycle. 06/12/23   Clayborne Dana, NP  clopidogrel (PLAVIX) 75 MG tablet Take 1 tablet (75 mg total) by mouth daily. 07/11/23   Pricilla Riffle, MD  furosemide (LASIX) 20 MG tablet TAKE 1 TABLET BY MOUTH ONCE  WEEKLY AS NEEDED 06/21/23   Pricilla Riffle, MD  hydrALAZINE (APRESOLINE) 10 MG tablet TAKE 1/2 TABLET (5 MG) BY MOUTH AS NEEDED FOR BP ABOVE 160 OR GREATER 04/02/23   Pricilla Riffle, MD  hydrocortisone (ANUSOL-HC) 2.5 % rectal cream Place 1 Application rectally 2 (two) times daily. 11/14/23    Meredith Pel, NP  nitroGLYCERIN (NITROSTAT) 0.4 MG SL tablet DISSOLVE 1 TABLET UNDER THE  TONGUE EVERY 5 MINUTES AS NEEDED FOR CHEST PAIN. MAX OF 3 TABLETS IN 15 MINUTES. CALL 911 IF PAIN  PERSISTS. 08/22/23   Pricilla Riffle, MD  REPATHA SURECLICK 140 MG/ML SOAJ INJECT 140MG  SUBCUTANEOUSLY  EVERY 2 WEEKS 01/20/24   Pricilla Riffle, MD  urea (CARMOL) 10 % cream Apply topically as needed. 12/27/22   Standiford, Jenelle Mages, DPM    Current Outpatient Medications  Medication Sig Dispense Refill   amLODipine (NORVASC) 10 MG tablet Take 1 tablet (10 mg total) by mouth daily. 90 tablet 2   aspirin EC 81 MG tablet Take 81 mg by mouth daily.     Bempedoic Acid (NEXLETOL) 180 MG TABS Take 1 tablet (  180 mg total) by mouth daily. 30 tablet 11   bisoprolol (ZEBETA) 5 MG tablet Take 0.5 tablets (2.5 mg total) by mouth daily. 45 tablet 1   Cholecalciferol (VITAMIN D) 125 MCG (5000 UT) CAPS Take 5,000 Units by mouth daily in the afternoon. 100 capsule 3   isosorbide mononitrate (IMDUR) 60 MG 24 hr tablet TAKE 1 TABLET BY MOUTH DAILY 90 tablet 3   levothyroxine (SYNTHROID) 112 MCG tablet Take 1 tablet (112 mcg total) by mouth 3 (three) times a week. 90 tablet 1   Multiple Vitamin (MULTIVITAMIN WITH MINERALS) TABS tablet Take 1 tablet by mouth daily.     pantoprazole (PROTONIX) 40 MG tablet TAKE 1 TABLET BY MOUTH DAILY 90 tablet 3   rosuvastatin (CRESTOR) 20 MG tablet Take 1 tablet (20 mg total) by mouth daily. 90 tablet 3   albuterol (VENTOLIN HFA) 108 (90 Base) MCG/ACT inhaler Inhale 2 puffs into the lungs every 6 (six) hours as needed for wheezing or shortness of breath. 8 g 0   B Complex-C (B-COMPLEX WITH VITAMIN C) tablet Take 1 tablet by mouth daily.     calcitRIOL (ROCALTROL) 0.25 MCG capsule TAKE 3 CAPSULES BY MOUTH DAILY 300 capsule 3   calcium carbonate (TUMS) 500 MG chewable tablet Chew 2 tablets (400 mg of elemental calcium total) by mouth 2 (two) times daily.     ciclopirox (PENLAC) 8 % solution Apply  topically at bedtime. Apply over nail and surrounding skin. Apply daily over previous coat. After seven (7) days, may remove with alcohol and continue cycle. 6.6 mL 0   clopidogrel (PLAVIX) 75 MG tablet Take 1 tablet (75 mg total) by mouth daily. 90 tablet 3   furosemide (LASIX) 20 MG tablet TAKE 1 TABLET BY MOUTH ONCE  WEEKLY AS NEEDED 15 tablet 3   hydrALAZINE (APRESOLINE) 10 MG tablet TAKE 1/2 TABLET (5 MG) BY MOUTH AS NEEDED FOR BP ABOVE 160 OR GREATER 30 tablet 6   hydrocortisone (ANUSOL-HC) 2.5 % rectal cream Place 1 Application rectally 2 (two) times daily. 30 g 1   nitroGLYCERIN (NITROSTAT) 0.4 MG SL tablet DISSOLVE 1 TABLET UNDER THE  TONGUE EVERY 5 MINUTES AS NEEDED FOR CHEST PAIN. MAX OF 3 TABLETS IN 15 MINUTES. CALL 911 IF PAIN  PERSISTS. 100 tablet 3   REPATHA SURECLICK 140 MG/ML SOAJ INJECT 140MG  SUBCUTANEOUSLY  EVERY 2 WEEKS 6 mL 3   urea (CARMOL) 10 % cream Apply topically as needed. 71 g 0   Current Facility-Administered Medications  Medication Dose Route Frequency Provider Last Rate Last Admin   0.9 %  sodium chloride infusion  500 mL Intravenous Continuous Jenel Lucks, MD        Allergies as of 02/06/2024 - Review Complete 02/06/2024  Allergen Reaction Noted   Zetia [ezetimibe] Other (See Comments) 11/23/2015   Penicillin g Rash 08/28/2022    Family History  Problem Relation Age of Onset   Colon cancer Brother    Cancer Brother        COLON   Hypertension Father    Heart disease Father    Heart attack Father    Blindness Sister    Congestive Heart Failure Sister    Hypertension Sister    Healthy Daughter    Healthy Son    Thyroid disease Sister    Cancer Brother        multiple myelomas   Healthy Daughter    Healthy Daughter    Diabetes Neg Hx    Prostate  cancer Neg Hx    Breast cancer Neg Hx    Esophageal cancer Neg Hx    Rectal cancer Neg Hx    Stomach cancer Neg Hx     Social History   Socioeconomic History   Marital status: Married     Spouse name: Not on file   Number of children: 4   Years of education: 14   Highest education level: Not on file  Occupational History   Occupation: Sales person at [employer]  Tobacco Use   Smoking status: Never   Smokeless tobacco: Never  Vaping Use   Vaping status: Never Used  Substance and Sexual Activity   Alcohol use: Not Currently   Drug use: No   Sexual activity: Not Currently  Other Topics Concern   Not on file  Social History Narrative   Lives at home alone.  Her son lives near her.   Right-handed.   1 cup coffee per day.   Social Drivers of Corporate investment banker Strain: Low Risk  (05/07/2022)   Overall Financial Resource Strain (CARDIA)    Difficulty of Paying Living Expenses: Not hard at all  Food Insecurity: No Food Insecurity (06/05/2023)   Hunger Vital Sign    Worried About Running Out of Food in the Last Year: Never true    Ran Out of Food in the Last Year: Never true  Transportation Needs: No Transportation Needs (06/05/2023)   PRAPARE - Administrator, Civil Service (Medical): No    Lack of Transportation (Non-Medical): No  Physical Activity: Sufficiently Active (05/07/2022)   Exercise Vital Sign    Days of Exercise per Week: 7 days    Minutes of Exercise per Session: 30 min  Stress: No Stress Concern Present (05/07/2022)   [employer] of Occupational Health - Occupational Stress Questionnaire    Feeling of Stress : Not at all  Social Connections: Unknown (08/22/2022)   Received from [facility]   Social Network    Social Network: Not on file  Intimate Partner Violence: Not At Risk (02/06/2023)   Humiliation, Afraid, Rape, and Kick questionnaire    Fear of Current or Ex-Partner: No    Emotionally Abused: No    Physically Abused: No    Sexually Abused: No    Review of Systems:  All other review of systems negative except as mentioned in the HPI.  Physical Exam: Vital signs BP 128/65   Pulse (!) 58   Temp 97.6 F  (36.4 C)   Ht 5\' 5"  (1.651 m)   Wt 196 lb (88.9 kg)   SpO2 95%   BMI 32.62 kg/m   General:   Alert,  Well-developed, well-nourished, pleasant and cooperative in NAD Airway:  Mallampati 2 Lungs:  Clear throughout to auscultation.   Heart:  Regular rate and rhythm; no murmurs, clicks, rubs,  or gallops. Abdomen:  Soft, nontender and nondistended. Normal bowel sounds.   Neuro/Psych:  Normal mood and affect. A and O x 3   Acen Craun E. Tomasa Rand, MD [facility]

## 2024-02-06 NOTE — Patient Instructions (Signed)
Please read handouts provided. Continue present medications. Resume previous diet. Resume Plavix ( clopidogrel ) at prior dose tomorrow.   YOU HAD AN ENDOSCOPIC PROCEDURE TODAY AT THE [facility]:   Refer to the procedure report that was given to you for any specific questions about what was found during the examination.  If the procedure report does not answer your questions, please call your gastroenterologist to clarify.  If you requested that your care partner not be given the details of your procedure findings, then the procedure report has been included in a sealed envelope for you to review at your convenience later.  YOU SHOULD EXPECT: Some feelings of bloating in the abdomen. Passage of more gas than usual.  Walking can help get rid of the air that was put into your GI tract during the procedure and reduce the bloating. If you had a lower endoscopy (such as a colonoscopy or flexible sigmoidoscopy) you may notice spotting of blood in your stool or on the toilet paper. If you underwent a bowel prep for your procedure, you may not have a normal bowel movement for a few days.  Please Note:  You might notice some irritation and congestion in your nose or some drainage.  This is from the oxygen used during your procedure.  There is no need for concern and it should clear up in a day or so.  SYMPTOMS TO REPORT IMMEDIATELY:  Following lower endoscopy (colonoscopy or flexible sigmoidoscopy):  Excessive amounts of blood in the stool  Significant tenderness or worsening of abdominal pains  Swelling of the abdomen that is new, acute  Fever of 100F or higher.  For urgent or emergent issues, a gastroenterologist can be reached at any hour by calling [phone]. Do not use MyChart messaging for urgent concerns.    DIET:  We do recommend a small meal at first, but then you may proceed to your regular diet.  Drink plenty of fluids but you should avoid alcoholic beverages for 24  hours.  ACTIVITY:  You should plan to take it easy for the rest of today and you should NOT DRIVE or use heavy machinery until tomorrow (because of the sedation medicines used during the test).    FOLLOW UP: Our staff will call the number listed on your records the next business day following your procedure.  We will call around 7:15- 8:00 am to check on you and address any questions or concerns that you may have regarding the information given to you following your procedure. If we do not reach you, we will leave a message.     If any biopsies were taken you will be contacted by phone or by letter within the next 1-3 weeks.  Please call us at [phone] if you have not heard about the biopsies in 3 weeks.    SIGNATURES/CONFIDENTIALITY: You and/or your care partner have signed paperwork which will be entered into your electronic medical record.  These signatures attest to the fact that that the information above on your After Visit Summary has been reviewed and is understood.  Full responsibility of the confidentiality of this discharge information lies with you and/or your care-partner.

## 2024-02-06 NOTE — Op Note (Signed)
[facility] Patient Name: [name] Procedure Date: 02/06/2024 1:20 PM MRN: [id number] Endoscopist: Lorin Picket E. Tomasa Rand , MD, [id number] Age: 78 Referring MD:  Date of Birth: [birth year] Gender: Female Account #: [id number] Procedure:                Colonoscopy Indications:              Surveillance: Personal history of adenomatous                            polyps on last colonoscopy > 3 years ago Medicines:                Monitored Anesthesia Care Procedure:                Pre-Anesthesia Assessment:                           - Prior to the procedure, a History and Physical                            was performed, and patient medications and                            allergies were reviewed. The patient's tolerance of                            previous anesthesia was also reviewed. The risks                            and benefits of the procedure and the sedation                            options and risks were discussed with the patient.                            All questions were answered, and informed consent                            was obtained. Prior Anticoagulants: The patient has                            taken Plavix (clopidogrel), last dose was 6 days                            prior to procedure. ASA Grade Assessment: III - A                            patient with severe systemic disease. After                            reviewing the risks and benefits, the patient was                            deemed in satisfactory condition to undergo the  procedure.                           After obtaining informed consent, the colonoscope                            was passed under direct vision. Throughout the                            procedure, the patient's blood pressure, pulse, and                            oxygen saturations were monitored continuously. The                            CF [id number] #[id number] was introduced  through the anus                            and advanced to the the cecum, identified by                            appendiceal orifice and ileocecal valve. The                            colonoscopy was somewhat difficult due to a                            tortuous colon. The patient tolerated the procedure                            well. The quality of the bowel preparation was                            excellent. The ileocecal valve, appendiceal                            orifice, and rectum were photographed. The bowel                            preparation used was SUPREP via split dose                            instruction. Scope In: 1:26:40 PM Scope Out: 1:45:22 PM Scope Withdrawal Time: 0 hours 8 minutes 51 seconds  Total Procedure Duration: 0 hours 18 minutes 42 seconds  Findings:                 The perianal and digital rectal examinations were                            normal. Pertinent negatives include normal                            sphincter tone and no palpable rectal lesions.  A few small-mouthed diverticula were found in the                            sigmoid colon.                           The exam was otherwise normal throughout the                            examined colon.                           The retroflexed view of the distal rectum and anal                            verge was normal and showed no anal or rectal                            abnormalities. Complications:            No immediate complications. Estimated Blood Loss:     Estimated blood loss: none. Impression:               - Mild diverticulosis in the sigmoid colon.                           - The distal rectum and anal verge are normal on                            retroflexion view.                           - No specimens collected. Recommendation:           - Patient has a contact number available for                            emergencies. The signs and  symptoms of potential                            delayed complications were discussed with the                            patient. Return to normal activities tomorrow.                            Written discharge instructions were provided to the                            patient.                           - Resume previous diet.                           - Continue present medications.                           -  Resume Plavix (clopidogrel) at prior dose                            tomorrow. [name] E. Tomasa Rand, MD 02/06/2024 1:51:35 PM This report has been signed electronically.

## 2024-02-06 NOTE — Progress Notes (Signed)
Pt's states no medical or surgical changes since previsit or office visit.

## 2024-02-06 NOTE — Progress Notes (Signed)
Sedate, gd SR, tolerated procedure well, VSS, report to RN

## 2024-02-07 ENCOUNTER — Telehealth: Payer: Self-pay | Admitting: *Deleted

## 2024-02-07 NOTE — Telephone Encounter (Signed)
Post procedure follow up call placed, no answer and left VM.

## 2024-02-11 ENCOUNTER — Other Ambulatory Visit: Payer: Self-pay

## 2024-02-11 ENCOUNTER — Ambulatory Visit: Payer: 59

## 2024-02-11 DIAGNOSIS — M51369 Other intervertebral disc degeneration, lumbar region without mention of lumbar back pain or lower extremity pain: Secondary | ICD-10-CM | POA: Diagnosis not present

## 2024-02-11 DIAGNOSIS — M25652 Stiffness of left hip, not elsewhere classified: Secondary | ICD-10-CM

## 2024-02-11 DIAGNOSIS — M436 Torticollis: Secondary | ICD-10-CM

## 2024-02-11 DIAGNOSIS — G8929 Other chronic pain: Secondary | ICD-10-CM

## 2024-02-11 DIAGNOSIS — M2560 Stiffness of unspecified joint, not elsewhere classified: Secondary | ICD-10-CM | POA: Diagnosis not present

## 2024-02-11 DIAGNOSIS — R293 Abnormal posture: Secondary | ICD-10-CM | POA: Diagnosis not present

## 2024-02-11 DIAGNOSIS — M25651 Stiffness of right hip, not elsewhere classified: Secondary | ICD-10-CM

## 2024-02-11 DIAGNOSIS — M5441 Lumbago with sciatica, right side: Secondary | ICD-10-CM | POA: Diagnosis not present

## 2024-02-11 DIAGNOSIS — M542 Cervicalgia: Secondary | ICD-10-CM

## 2024-02-11 NOTE — Therapy (Signed)
 OUTPATIENT PHYSICAL THERAPY THORACOLUMBAR TREATMENT   Patient Name: [name] MRN: [id number] DOB:Mar 08, 1946, 78 y.o., female Today's Date: 02/11/2024  END OF SESSION:  PT End of Session - 02/11/24 1037     Visit Number 2    Date for PT Re-Evaluation 04/28/24    Progress Note Due on Visit 10    PT Start Time 1016    PT Stop Time 1100    PT Time Calculation (min) 44 min    Activity Tolerance Patient tolerated treatment well    Behavior During Therapy [facility] for tasks assessed/performed              Past Medical History:  Diagnosis Date   Acute MI, subendocardial (HCC)    Arthritis    Back pain 05/31/2013   Benign paroxysmal positional vertigo 08/05/2016   CAD (coronary artery disease)    a. NSTEMI 10/2011: LHC 11/19/11: pLAD 30%, oOM 60%, AVCFX 30%, CFX after OM2 30%, pRCA 60 and 70%, then 99%, AM 80-90% with TIMI 3 flow.  PCI: Promus DES x 2 to RCA; b. 06/2012 Cath: patent RCA stents w/ subtl occl of Acute Marginal (jailed)->Med rx; c. 05/2015 MV: EF 59%, mod mid infsept/inf/ap lat/ap infarct with peri-infarct isch-->Med Rx; d. 02/2016 Cath: LM nl, LAD 73m, RI 50, RCA patent stents.   Colitis    Facial skin lesion 01/28/2017   GERD (gastroesophageal reflux disease)    occasional   Hyperlipidemia    Hypertension    Hypertensive heart disease    a. Echocardiogram 11/19/11: Difficult acoustic windows, EF 60-65%, normal LV wall thickness, grade 1 diastolic dysfunction   Hypoparathyroidism (HCC)    Low zinc level 11/26/2016   Multinodular thyroid    Goiter s/p thyroidectomy in 2007 with post-op  hypocalcemia and post-op hypothyroidism/hypoparathyroidism with hypocalcemia   Neck pain on right side 07/13/2013   Nocturia 05/31/2013   Osteoarthritis    Pain in right axilla 03/13/2016   Palpitations    a. 03/2012 - patient set up for event monitor but did not wear correctly -  she declined wearing a repeat monitor   Post-surgical hypothyroidism    Sun-damaged skin 12/05/2014    Tubular adenoma of colon 06/2011   Unspecified constipation 05/31/2013   Vertigo    Zinc deficiency 11/26/2015   Past Surgical History:  Procedure Laterality Date   CARDIAC CATHETERIZATION N/A 03/08/2016   Procedure: Left Heart Cath and Coronary Angiography;  Surgeon: Corky Crafts, MD;  Location: [facility] INVASIVE CV LAB;  Service: Cardiovascular;  Laterality: N/A;   COLONOSCOPY  Aenomatous polyps   07/05/2011   COLONOSCOPY N/A 09/28/2014   Procedure: COLONOSCOPY;  Surgeon: Meryl Dare, MD;  Location: [facility] ENDOSCOPY;  Service: Endoscopy;  Laterality: N/A;   CORONARY ANGIOPLASTY WITH STENT PLACEMENT     LEFT HEART CATH AND CORONARY ANGIOGRAPHY N/A 12/09/2018   Procedure: LEFT HEART CATH AND CORONARY ANGIOGRAPHY;  Surgeon: Corky Crafts, MD;  Location: [facility] INVASIVE CV LAB;  Service: Cardiovascular;  Laterality: N/A;   LEFT HEART CATHETERIZATION WITH CORONARY ANGIOGRAM N/A 07/17/2012   Procedure: LEFT HEART CATHETERIZATION WITH CORONARY ANGIOGRAM;  Surgeon: Herby Abraham, MD;  [facility] CATH LAB;  Service: Cardiovascular;  Laterality: N/A;   PERCUTANEOUS CORONARY STENT INTERVENTION (PCI-S) N/A 11/19/2011   Procedure: PERCUTANEOUS CORONARY STENT INTERVENTION (PCI-S);  Surgeon: Herby Abraham, MD;  Location: [facility] CATH LAB;  Service: Cardiovascular;  Laterality: N/A;   POLYPECTOMY     SHOULDER ARTHROSCOPY W/ ROTATOR CUFF REPAIR  right   TOTAL THYROIDECTOMY  2007   GOITER   Patient Active Problem List   Diagnosis Date Noted   Loss of transverse plantar arch 02/05/2024   Bronchitis 01/07/2024   Onychomycosis 11/05/2023   CRI (chronic renal insufficiency) 07/16/2023   Arthritis of left midfoot 06/11/2023   Sinus tarsi syndrome of left ankle 04/03/2023   Sinus tarsi syndrome of right ankle 02/18/2023   Edema 02/17/2023   Acute prerenal azotemia 02/06/2023   TIA (transient ischemic attack) 02/05/2023   Paresthesia 08/28/2022   Chronic neck pain 08/28/2022   Neuropathy  08/21/2022   Vitamin deficiency 10/23/2021   Hypoglycemia 10/07/2019   Heart disease 01/29/2019   Sensorineural hearing loss (SNHL) of both ears 01/27/2019   Primary osteoarthritis of both knees 12/19/2018   DDD (degenerative disc disease), lumbar 12/19/2018   ANA positive 10/27/2018   Anxiety 02/17/2018   Peripheral arterial disease (HCC) 02/20/2017   Skin lesion of face 01/28/2017   Benign paroxysmal positional vertigo 08/05/2016   Antiplatelet or antithrombotic long-term use 03/27/2016   Right lumbar radiculopathy 02/21/2016   Abnormality of gait 02/21/2016   Zinc deficiency 11/26/2015   Right hip pain 10/26/2015   Medicare annual wellness visit, subsequent 03/13/2015   Preventative health care 03/13/2015   Sun-damaged skin 12/05/2014   Angina of effort (HCC) 12/02/2014   Hx of colonic polyps 09/28/2014   Benign neoplasm of descending colon 09/28/2014   Other fatigue 09/10/2014   History of colonic polyps 07/16/2014   Bilateral thumb pain 03/20/2014   Postsurgical hypoparathyroidism (HCC) 08/03/2013   Neck pain on right side 07/13/2013   Chronic constipation 05/31/2013   Back pain 05/31/2013   Nocturia 05/31/2013   GERD (gastroesophageal reflux disease) 11/02/2012   Tension headache 11/02/2012   Anemia 06/11/2012   Hypokalemia 06/11/2012   Depression 01/21/2012   Headache 12/23/2011   Sinus bradycardia 12/07/2011   Hypocalcemia 12/07/2011   Acute MI, subendocardial (HCC) 11/20/2011   Acquired hypothyroidism 05/20/2009   Essential hypertension, benign 05/20/2009   Hyperlipidemia 03/08/2009   CAD, NATIVE VESSEL 03/08/2009    PCP: Danise Edge, MD  REFERRING PROVIDER: same  REFERRING DIAG: chronic cervical spine pain and chronic lower back pain  Rationale for Evaluation and Treatment: Rehabilitation  THERAPY DIAG:  Joint stiffness of spine  Cervicalgia  Stiffness of left hip, not elsewhere classified  Stiffness of right hip, not elsewhere  classified  Neck stiffness  Chronic right-sided low back pain with right-sided sciatica  Abnormal posture  ONSET DATE: chronic, years  SUBJECTIVE:  SUBJECTIVE STATEMENT:02/11/24: I felt better after last week and tried to dig in the yard yesterday, bent down and then couldn't stand back up Eval: I had some kind of respiratory thing and became very sedentary over the last couple of months.  My back and my neck have gotten much stiffer/painful since I havent been able to be as active.  PERTINENT HISTORY:  Chronic history of neck and lower back pain, used to get injections lower back but has stopped in the last couple of years.  PAIN:  Are you having pain? Yes: NPRS scale: 0 to 5 Pain location: B SI region and post hips/ gluts,B upper traps Pain description: aching all the time, stiff, painful to touch Aggravating factors: prolonged sitting  Relieving factors: gentle movement seems to help   PRECAUTIONS: None  RED FLAGS: None   WEIGHT BEARING RESTRICTIONS: No  FALLS:  Has patient fallen in last 6 months? No  LIVING ENVIRONMENT: Lives with: lives alone Lives in: House/apartment Stairs: No Has following equipment at home: None  OCCUPATION: retired   PLOF: Independent  PATIENT GOALS: To be able to garden in the spring, be able to travel with my children  NEXT MD VISIT: 2 months  OBJECTIVE:  Note: Objective measures were completed at Evaluation unless otherwise noted.  DIAGNOSTIC FINDINGS:  None recently  PATIENT SURVEYS:  Modified Oswestry Modified Oswestry Low Back Pain Disability Questionnaire: 17 / 50 = 34.0 %  NDI TBD  COGNITION: Overall cognitive status: Within functional limits for tasks assessed     SENSATION: WFL  MUSCLE LENGTH: Hamstrings: Right wnl deg; Left wnl  deg Maisie Fus test: Right nt deg; Left nt deg  POSTURE: wider base of support, mild forward head posture   PALPATION: Very tender B gluteals, greater on R than L  LUMBAR ROM:   AROM eval  Flexion 70   Extension wfl  Right lateral flexion 35  Left lateral flexion 35  Right rotation   Left rotation    (Blank rows = not tested) CERVICAL ROM: Flexion 60, ext 50, rotation L 65, rotation R 50 LOWER EXTREMITY ROM:     Passive  Right eval Left eval  Hip flexion 90 90  Hip extension    Hip abduction    Hip adduction    Hip internal rotation    Hip external rotation 10 10  Knee flexion    Knee extension    Ankle dorsiflexion    Ankle plantarflexion    Ankle inversion    Ankle eversion     (Blank rows = not tested)  LOWER EXTREMITY MMT:  all LE MMT 4/5 UE MMT: all UE MMT 4/5 LUMBAR SPECIAL TESTS:  Straight leg raise test: Negative, Slump test: Negative, and FABER test: Positive  FUNCTIONAL TESTS:  30 seconds chair stand test  GAIT: Distance walked: in clinic up to 90', now device  TREATMENT DATE:  02/11/24 Prone for moist heat entire back and post neck, combined  with electrical stimulation, IFC, 2 pads upper traps, 2 pads lumbar, for 15 min Supine for mob with movement for L hip flexion and Er ROM with lateral glides R femoral head.  3 bouts  Initiated therex for L spine and for B hips, but pt became nauseous, so assisted her to sitting and provided with ginger ale, unable to proceed with therex for L spine.  02/04/24:  Evaluation, mobilization with movement  R hip 3 bouts of R hip flexion with lateral glides R femoral head Side lying L for  cross friction massage R lateral hip with theragun  Supine with legs over physioball for LTR and B knee to chest.                                                                                                                             PATIENT EDUCATION:  Education details: POC, goals Person educated: Patient Education method:  Explanation, Demonstration, Tactile cues, and Verbal cues Education comprehension: verbalized understanding, returned demonstration, verbal cues required, and tactile cues required  HOME EXERCISE PROGRAM:TBD  ASSESSMENT:  CLINICAL IMPRESSION: Patient is a 78 y.o. female who participated in physical therapy treatment today due to an exacerbation of chronic neck and lower back pain.  She is well known to this clinic.  She is typically very active, motivated, lives alone, community ambulator, travels with her children, maintains a garden.   Focused today again on on R hip stiffness, per her request as this approach helped at initial visit.  She tolerated well but had some stomach, abdominal discomfort before we were able to proceed with therex/ stretching for lumbar region.  Should benefit from skilled PT with combination of manual techniques, directed therex to improve her mobility, pain, activity tolerance so that she can maintain her I.  OBJECTIVE IMPAIRMENTS: decreased activity tolerance, decreased endurance, decreased mobility, decreased ROM, decreased strength, dizziness, hypomobility, increased fascial restrictions, impaired flexibility, impaired UE functional use, and pain.   ACTIVITY LIMITATIONS: lifting, bending, squatting, sleeping, transfers, bed mobility, reach over head, and caring for others  PARTICIPATION LIMITATIONS: cleaning, laundry, shopping, community activity, and yard work  PERSONAL FACTORS: Age, Behavior pattern, Fitness, Past/current experiences, and 1-2 comorbidities: h/o MI, angina, chronic vertigo  are also affecting patient's functional outcome.   REHAB POTENTIAL: Good  CLINICAL DECISION MAKING: Evolving/moderate complexity  EVALUATION COMPLEXITY: Moderate   GOALS: Goals reviewed with patient? Yes  SHORT TERM GOALS: Target date: 2 weeks   Establish initial HEP and pt I Baseline:TBD Goal status: INITIAL   LONG TERM GOALS: Target date: 12 weeks,  04/28/24  Modified Oswestry Low Back Pain Disability Questionnaire: 17 / 50 = 34.0 %  Baseline:  Goal status: INITIAL  2.  NDI 15% disability or less Baseline: TBD Goal status: INITIAL  3.  Improve ROM B hips to greater than 105 degrees flexion for better ease of movement for transfers, squatting, reaching activities Baseline: 90 degrees Goal status: INITIAL  4.  30 sec sit to stand , 12 reps Baseline: TBD Goal status: INITIAL   PLAN:  PT FREQUENCY: 1x/week  PT DURATION: 12 weeks  PLANNED INTERVENTIONS: 97110-Therapeutic exercises, 97530- Therapeutic activity, 97112- Neuromuscular re-education, 97535- Self Care, and 46962- Manual therapy.  PLAN FOR NEXT SESSION: Reassess LS and hip , c spine ROM, resume periscapular strengthening to improve lower cervical spine mobility and pain, may use TPDN as indicated, continue with manual techniques, therex to improve B hip and lumbar mobility as well.   Westlynn Fifer Frazier Richards, PT, DPT, OCS 02/11/2024,  3:54 PM

## 2024-02-18 ENCOUNTER — Ambulatory Visit: Payer: 59

## 2024-02-18 DIAGNOSIS — R600 Localized edema: Secondary | ICD-10-CM | POA: Diagnosis not present

## 2024-02-18 DIAGNOSIS — I83893 Varicose veins of bilateral lower extremities with other complications: Secondary | ICD-10-CM | POA: Diagnosis not present

## 2024-02-26 ENCOUNTER — Other Ambulatory Visit (INDEPENDENT_AMBULATORY_CARE_PROVIDER_SITE_OTHER): Payer: 59

## 2024-02-26 ENCOUNTER — Encounter: Payer: Self-pay | Admitting: Family Medicine

## 2024-02-26 DIAGNOSIS — I10 Essential (primary) hypertension: Secondary | ICD-10-CM

## 2024-02-26 LAB — COMPREHENSIVE METABOLIC PANEL
ALT: 13 U/L (ref 0–35)
AST: 16 U/L (ref 0–37)
Albumin: 4.1 g/dL (ref 3.5–5.2)
Alkaline Phosphatase: 95 U/L (ref 39–117)
BUN: 21 mg/dL (ref 6–23)
CO2: 32 meq/L (ref 19–32)
Calcium: 9.3 mg/dL (ref 8.4–10.5)
Chloride: 99 meq/L (ref 96–112)
Creatinine, Ser: 1.07 mg/dL (ref 0.40–1.20)
GFR: 50.28 mL/min — ABNORMAL LOW (ref >60.00)
Glucose, Bld: 108 mg/dL — ABNORMAL HIGH (ref 70–99)
Potassium: 4.3 meq/L (ref 3.5–5.1)
Sodium: 141 meq/L (ref 135–145)
Total Bilirubin: 0.4 mg/dL (ref 0.2–1.2)
Total Protein: 7.3 g/dL (ref 6.0–8.3)

## 2024-03-03 ENCOUNTER — Ambulatory Visit: Payer: 59

## 2024-03-05 ENCOUNTER — Ambulatory Visit: Attending: Family Medicine

## 2024-03-11 ENCOUNTER — Other Ambulatory Visit: Payer: Self-pay | Admitting: Family Medicine

## 2024-03-11 ENCOUNTER — Telehealth: Payer: Self-pay | Admitting: Pharmacist

## 2024-03-11 DIAGNOSIS — E7849 Other hyperlipidemia: Secondary | ICD-10-CM

## 2024-03-11 DIAGNOSIS — K2100 Gastro-esophageal reflux disease with esophagitis, without bleeding: Secondary | ICD-10-CM

## 2024-03-11 NOTE — Telephone Encounter (Signed)
 Discussed last lipid lab LDL was at goal 51 mg/dl however TG was 401 mg/dl.   Patient reports she has lower her carbohydrate intake and cut down on sweets, she go fro brisk walks 40- 45 min everyday. She has been taking Crestor 20 mg , Nexletol and Repatha 140 mg. She does not want to add any more medications and continue with Lifestyle interventions for now we will repeat lab in 3 to 4 months.

## 2024-03-27 ENCOUNTER — Ambulatory Visit (INDEPENDENT_AMBULATORY_CARE_PROVIDER_SITE_OTHER): Admitting: Family

## 2024-03-27 VITALS — BP 116/44 | HR 56 | Temp 98.5°F | Resp 16 | Ht 65.0 in | Wt 190.0 lb

## 2024-03-27 DIAGNOSIS — M6740 Ganglion, unspecified site: Secondary | ICD-10-CM | POA: Diagnosis not present

## 2024-03-27 DIAGNOSIS — R300 Dysuria: Secondary | ICD-10-CM | POA: Diagnosis not present

## 2024-03-27 LAB — URINALYSIS, ROUTINE W REFLEX MICROSCOPIC
Bilirubin Urine: NEGATIVE
Hgb urine dipstick: NEGATIVE
Ketones, ur: NEGATIVE
Nitrite: NEGATIVE
Specific Gravity, Urine: 1.01 (ref 1.000–1.030)
Total Protein, Urine: NEGATIVE
Urine Glucose: NEGATIVE
Urobilinogen, UA: 0.2 (ref 0.0–1.0)
pH: 7 (ref 5.0–8.0)

## 2024-03-27 NOTE — Assessment & Plan Note (Signed)
 Will culture urine

## 2024-03-27 NOTE — Progress Notes (Signed)
 Subjective:     Patient ID: [name], female    DOB: 06-Apr-1946, 78 y.o.   MRN: [id number]  Chief Complaint  Patient presents with   Cyst    Patient reports having a cyst on right wrist. Pain with movement    HPI  Discussed the use of AI scribe software for clinical note transcription with the patient, who gave verbal consent to proceed.  History of Present Illness  C/o cyst on right wrist- first noticed it a few months ago.  Pain has worsened.    She also complains of urinary frequency/urgency recently.     Health Maintenance Due  Topic Date Due   Zoster Vaccines- Shingrix (1 of 2) Never done   Cervical Cancer Screening (Pap smear)  12/28/2021   Medicare Annual Wellness (AWV)  05/08/2023   DTaP/Tdap/Td (3 - Td or Tdap) 03/15/2024    Past Medical History:  Diagnosis Date   Acute MI, subendocardial (HCC)    Arthritis    Back pain 05/31/2013   Benign paroxysmal positional vertigo 08/05/2016   CAD (coronary artery disease)    a. NSTEMI 10/2011: LHC 11/19/11: pLAD 30%, oOM 60%, AVCFX 30%, CFX after OM2 30%, pRCA 60 and 70%, then 99%, AM 80-90% with TIMI 3 flow.  PCI: Promus DES x 2 to RCA; b. 06/2012 Cath: patent RCA stents w/ subtl occl of Acute Marginal (jailed)->Med rx; c. 05/2015 MV: EF 59%, mod mid infsept/inf/ap lat/ap infarct with peri-infarct isch-->Med Rx; d. 02/2016 Cath: LM nl, LAD 73m, RI 50, RCA patent stents.   Colitis    Facial skin lesion 01/28/2017   GERD (gastroesophageal reflux disease)    occasional   Hyperlipidemia    Hypertension    Hypertensive heart disease    a. Echocardiogram 11/19/11: Difficult acoustic windows, EF 60-65%, normal LV wall thickness, grade 1 diastolic dysfunction   Hypoparathyroidism (HCC)    Low zinc level 11/26/2016   Multinodular thyroid    Goiter s/p thyroidectomy in 2007 with post-op  hypocalcemia and post-op hypothyroidism/hypoparathyroidism with hypocalcemia   Neck pain on right side 07/13/2013   Nocturia 05/31/2013    Osteoarthritis    Pain in right axilla 03/13/2016   Palpitations    a. 03/2012 - patient set up for event monitor but did not wear correctly -  she declined wearing a repeat monitor   Post-surgical hypothyroidism    Sun-damaged skin 12/05/2014   Tubular adenoma of colon 06/2011   Unspecified constipation 05/31/2013   Vertigo    Zinc deficiency 11/26/2015    Past Surgical History:  Procedure Laterality Date   CARDIAC CATHETERIZATION N/A 03/08/2016   Procedure: Left Heart Cath and Coronary Angiography;  Surgeon: Corky Crafts, MD;  Location: [facility] INVASIVE CV LAB;  Service: Cardiovascular;  Laterality: N/A;   COLONOSCOPY  Aenomatous polyps   07/05/2011   COLONOSCOPY N/A 09/28/2014   Procedure: COLONOSCOPY;  Surgeon: Meryl Dare, MD;  Location: [facility] ENDOSCOPY;  Service: Endoscopy;  Laterality: N/A;   CORONARY ANGIOPLASTY WITH STENT PLACEMENT     LEFT HEART CATH AND CORONARY ANGIOGRAPHY N/A 12/09/2018   Procedure: LEFT HEART CATH AND CORONARY ANGIOGRAPHY;  Surgeon: Corky Crafts, MD;  Location: [facility] INVASIVE CV LAB;  Service: Cardiovascular;  Laterality: N/A;   LEFT HEART CATHETERIZATION WITH CORONARY ANGIOGRAM N/A 07/17/2012   Procedure: LEFT HEART CATHETERIZATION WITH CORONARY ANGIOGRAM;  Surgeon: Herby Abraham, MD;  Location: [facility] CATH LAB;  Service: Cardiovascular;  Laterality: N/A;   PERCUTANEOUS CORONARY STENT INTERVENTION (PCI-S)  N/A 11/19/2011   Procedure: PERCUTANEOUS CORONARY STENT INTERVENTION (PCI-S);  Surgeon: Herby Abraham, MD;  Location: [facility] CATH LAB;  Service: Cardiovascular;  Laterality: N/A;   POLYPECTOMY     SHOULDER ARTHROSCOPY W/ ROTATOR CUFF REPAIR     right   TOTAL THYROIDECTOMY  2007   GOITER    Family History  Problem Relation Age of Onset   Colon cancer Brother    Cancer Brother        COLON   Hypertension Father    Heart disease Father    Heart attack Father    Blindness Sister    Congestive Heart Failure Sister    Hypertension Sister    Healthy  Daughter    Healthy Son    Thyroid disease Sister    Cancer Brother        multiple myelomas   Healthy Daughter    Healthy Daughter    Diabetes Neg Hx    Prostate cancer Neg Hx    Breast cancer Neg Hx    Esophageal cancer Neg Hx    Rectal cancer Neg Hx    Stomach cancer Neg Hx     Social History   Socioeconomic History   Marital status: Married    Spouse name: Not on file   Number of children: 4   Years of education: 14   Highest education level: Not on file  Occupational History   Occupation: Sales person at [employer]  Tobacco Use   Smoking status: Never   Smokeless tobacco: Never  Vaping Use   Vaping status: Never Used  Substance and Sexual Activity   Alcohol use: Not Currently   Drug use: No   Sexual activity: Not Currently  Other Topics Concern   Not on file  Social History Narrative   Lives at home alone.  Her son lives near her.   Right-handed.   1 cup coffee per day.   Social Drivers of Corporate investment banker Strain: Low Risk  (05/07/2022)   Overall Financial Resource Strain (CARDIA)    Difficulty of Paying Living Expenses: Not hard at all  Food Insecurity: No Food Insecurity (06/05/2023)   Hunger Vital Sign    Worried About Running Out of Food in the Last Year: Never true    Ran Out of Food in the Last Year: Never true  Transportation Needs: No Transportation Needs (06/05/2023)   PRAPARE - Administrator, Civil Service (Medical): No    Lack of Transportation (Non-Medical): No  Physical Activity: Sufficiently Active (05/07/2022)   Exercise Vital Sign    Days of Exercise per Week: 7 days    Minutes of Exercise per Session: 30 min  Stress: No Stress Concern Present (05/07/2022)   [employer] of Occupational Health - Occupational Stress Questionnaire    Feeling of Stress : Not at all  Social Connections: Unknown (08/22/2022)   Received [facility]   Social Network    Social Network: Not on file  Intimate Partner  Violence: Not At Risk (02/06/2023)   Humiliation, Afraid, Rape, and Kick questionnaire    Fear of Current or Ex-Partner: No    Emotionally Abused: No    Physically Abused: No    Sexually Abused: No    Outpatient Medications Prior to Visit  Medication Sig Dispense Refill   albuterol (VENTOLIN HFA) 108 (90 Base) MCG/ACT inhaler Inhale 2 puffs into the lungs every 6 (six) hours as needed for wheezing or shortness of breath.  8 g 0   amLODipine (NORVASC) 10 MG tablet Take 1 tablet (10 mg total) by mouth daily. 90 tablet 2   aspirin EC 81 MG tablet Take 81 mg by mouth daily.     B Complex-C (B-COMPLEX WITH VITAMIN C) tablet Take 1 tablet by mouth daily.     Bempedoic Acid (NEXLETOL) 180 MG TABS Take 1 tablet (180 mg total) by mouth daily. 30 tablet 11   bisoprolol (ZEBETA) 5 MG tablet Take 0.5 tablets (2.5 mg total) by mouth daily. 45 tablet 1   calcitRIOL (ROCALTROL) 0.25 MCG capsule TAKE 3 CAPSULES BY MOUTH DAILY 300 capsule 3   calcium carbonate (TUMS) 500 MG chewable tablet Chew 2 tablets (400 mg of elemental calcium total) by mouth 2 (two) times daily.     Cholecalciferol (VITAMIN D) 125 MCG (5000 UT) CAPS Take 5,000 Units by mouth daily in the afternoon. 100 capsule 3   ciclopirox (PENLAC) 8 % solution Apply topically at bedtime. Apply over nail and surrounding skin. Apply daily over previous coat. After seven (7) days, may remove with alcohol and continue cycle. 6.6 mL 0   clopidogrel (PLAVIX) 75 MG tablet Take 1 tablet (75 mg total) by mouth daily. 90 tablet 3   furosemide (LASIX) 20 MG tablet TAKE 1 TABLET BY MOUTH ONCE  WEEKLY AS NEEDED 15 tablet 3   hydrALAZINE (APRESOLINE) 10 MG tablet TAKE 1/2 TABLET (5 MG) BY MOUTH AS NEEDED FOR BP ABOVE 160 OR GREATER 30 tablet 6   hydrocortisone (ANUSOL-HC) 2.5 % rectal cream Place 1 Application rectally 2 (two) times daily. 30 g 1   isosorbide mononitrate (IMDUR) 60 MG 24 hr tablet TAKE 1 TABLET BY MOUTH DAILY 90 tablet 3   levothyroxine  (SYNTHROID) 112 MCG tablet Take 1 tablet (112 mcg total) by mouth 3 (three) times a week. 90 tablet 1   Multiple Vitamin (MULTIVITAMIN WITH MINERALS) TABS tablet Take 1 tablet by mouth daily.     nitroGLYCERIN (NITROSTAT) 0.4 MG SL tablet DISSOLVE 1 TABLET UNDER THE  TONGUE EVERY 5 MINUTES AS NEEDED FOR CHEST PAIN. MAX OF 3 TABLETS IN 15 MINUTES. CALL 911 IF PAIN  PERSISTS. 100 tablet 3   pantoprazole (PROTONIX) 40 MG tablet TAKE 1 TABLET BY MOUTH DAILY 100 tablet 2   REPATHA SURECLICK 140 MG/ML SOAJ INJECT 140MG  SUBCUTANEOUSLY  EVERY 2 WEEKS 6 mL 3   urea (CARMOL) 10 % cream Apply topically as needed. 71 g 0   rosuvastatin (CRESTOR) 20 MG tablet Take 1 tablet (20 mg total) by mouth daily. 90 tablet 3   No facility-administered medications prior to visit.    Allergies  Allergen Reactions   Zetia [Ezetimibe] Other (See Comments)    Myalgia, paresthesias and weakness   Penicillin G Rash    ROS See HPI    Objective:    Physical Exam Constitutional:      Appearance: Normal appearance.  Pulmonary:     Effort: Pulmonary effort is normal.  Musculoskeletal:     Comments: Firm nodule noted right wrist palmar surface  Neurological:     Mental Status: She is alert.  Psychiatric:        Attention and Perception: Attention normal.        Mood and Affect: Mood normal.        Speech: Speech normal.        Behavior: Behavior normal.      BP (!) 116/44 (BP Location: Right Arm, Patient Position: Sitting, Cuff Size: Large)  Pulse (!) 56   Temp 98.5 F (36.9 C) (Oral)   Resp 16   Ht 5\' 5"  (1.651 m)   Wt 190 lb (86.2 kg)   SpO2 96%   BMI 31.62 kg/m  Wt Readings from Last 3 Encounters:  03/27/24 190 lb (86.2 kg)  02/06/24 196 lb (88.9 kg)  01/21/24 190 lb (86.2 kg)       Assessment & Plan:   Problem List Items Addressed This Visit       Unprioritized   Ganglion cyst - Primary   New. Will send to orthopedics for further evaluation.       Relevant Orders   Ambulatory  referral to Orthopedic Surgery   Dysuria   Will culture urine.      Relevant Orders   Urinalysis, Routine w reflex microscopic   Urine Culture    I am having Arrionna T. Gail maintain her aspirin EC, B-complex with vitamin C, multivitamin with minerals, urea, Vitamin D, hydrALAZINE, ciclopirox, furosemide, isosorbide mononitrate, clopidogrel, calcitRIOL, nitroGLYCERIN, amLODipine, bisoprolol, hydrocortisone, levothyroxine, rosuvastatin, Nexletol, albuterol, calcium carbonate, Repatha SureClick, and pantoprazole.  No orders of the defined types were placed in this encounter.

## 2024-03-27 NOTE — Assessment & Plan Note (Signed)
 New. Will send to orthopedics for further evaluation.

## 2024-03-27 NOTE — Progress Notes (Signed)
   Established Patient Office Visit  Subjective   Patient ID: [name], female    DOB: 08/17/1946  Age: 78 y.o. MRN: [id number]  Chief Complaint  Patient presents with   Cyst    Patient reports having a cyst on right wrist. Pain with movement    Patient presents to office complaining of right wrist pain and cyst-like structure for 2 months. She reports that pain is worse with palpation. Her pain has become worse in the last two weeks and is extending into the thumb. Patient denies injury. She has not tried any pain relief interventions.    ROS See HPI   Objective:     BP (!) 116/44 (BP Location: Right Arm, Patient Position: Sitting, Cuff Size: Large)   Pulse (!) 56   Temp 98.5 F (36.9 C) (Oral)   Resp 16   Ht 5\' 5"  (1.651 m)   Wt 190 lb (86.2 kg)   SpO2 96%   BMI 31.62 kg/m    Physical Exam Vitals reviewed.  Constitutional:      Appearance: Normal appearance.  HENT:     Head: Normocephalic and atraumatic.     Right Ear: External ear normal.     Left Ear: External ear normal.  Cardiovascular:     Rate and Rhythm: Normal rate and regular rhythm.     Pulses: Normal pulses.     Heart sounds: Normal heart sounds.  Pulmonary:     Effort: Pulmonary effort is normal.     Breath sounds: Normal breath sounds.  Musculoskeletal:     Comments: 3-4 mm well-circumscribed, smooth, compressible cyst-like structure noted to the volar aspect of the right wrist near the radial artery.   Skin:    General: Skin is warm and dry.     Capillary Refill: Capillary refill takes less than 2 seconds.  Neurological:     Mental Status: She is alert and oriented to person, place, and time.  Psychiatric:        Mood and Affect: Mood normal.        Behavior: Behavior normal.      Assessment & Plan:   Wrist pain - new problem. Likely ganglion cyst. Refer to orthopedics.   Cristopher Peru, RN

## 2024-03-28 LAB — URINE CULTURE
MICRO NUMBER:: 16290414
SPECIMEN QUALITY:: ADEQUATE

## 2024-03-29 ENCOUNTER — Encounter: Payer: Self-pay | Admitting: Family

## 2024-04-02 ENCOUNTER — Other Ambulatory Visit (INDEPENDENT_AMBULATORY_CARE_PROVIDER_SITE_OTHER): Payer: Self-pay

## 2024-04-02 ENCOUNTER — Ambulatory Visit (INDEPENDENT_AMBULATORY_CARE_PROVIDER_SITE_OTHER): Admitting: Orthopaedic Surgery

## 2024-04-02 DIAGNOSIS — M25531 Pain in right wrist: Secondary | ICD-10-CM

## 2024-04-02 NOTE — Progress Notes (Signed)
 Office Visit Note   Patient: [name]           Date of Birth: 05/04/1946           MRN: [id number] Visit Date: 04/02/2024              Requested by: Sandford Craze, NP [id number] [place],  [place] [address] PCP: Bradd Canary, MD   Assessment & Plan: Visit Diagnoses:  1. Pain in right wrist     Plan: Patient is a 78 year old female with symptomatic right volar wrist ganglion cyst.  Treatment options were discussed and based on her options she has elected to move forward with surgical excision.  We will send her to see Dr. Denese Killings for surgical consultation.  Follow-up with me as needed.  Follow-Up Instructions: No follow-ups on file.   Orders:  Orders Placed This Encounter  Procedures   XR Wrist Complete Right   No orders of the defined types were placed in this encounter.     Procedures: No procedures performed   Clinical Data: No additional findings.   Subjective: Chief Complaint  Patient presents with   Right Wrist - Pain   Right Thumb - Pain    HPI Patient is a 78 year old female here for evaluation of a symptomatic right volar wrist ganglion cyst.  Occasionally has significant pain and swelling that affects hand function.  Right-hand-dominant.  Denies any numbness tingling or burning.  Pain radiates into the thumb when symptomatic.  Does not take any medications for this. Review of Systems  Constitutional: Negative.   HENT: Negative.    Eyes: Negative.   Respiratory: Negative.    Cardiovascular: Negative.   Endocrine: Negative.   Musculoskeletal: Negative.   Neurological: Negative.   Hematological: Negative.   Psychiatric/Behavioral: Negative.    All other systems reviewed and are negative.    Objective: Vital Signs: There were no vitals taken for this visit.  Physical Exam Vitals and nursing note reviewed.  Constitutional:      Appearance: She is well-developed.  HENT:     Head: Atraumatic.     Nose: Nose  normal.  Eyes:     Extraocular Movements: Extraocular movements intact.  Cardiovascular:     Pulses: Normal pulses.  Pulmonary:     Effort: Pulmonary effort is normal.  Abdominal:     Palpations: Abdomen is soft.  Musculoskeletal:     Cervical back: Neck supple.  Skin:    General: Skin is warm.     Capillary Refill: Capillary refill takes less than 2 seconds.  Neurological:     Mental Status: She is alert. Mental status is at baseline.  Psychiatric:        Behavior: Behavior normal.        Thought Content: Thought content normal.        Judgment: Judgment normal.     Ortho Exam Exam of the right wrist shows a small pea-sized volar mass overlying the FCR tendon.  This is slightly tender to palpation.  No overlying skin changes.  Neurovascularly intact to the hand. Specialty Comments:  No specialty comments available.  Imaging: XR Wrist Complete Right Result Date: 04/02/2024 X-rays of the right wrist show moderate degenerative osteophytic changes to the radial scaphoid joint and thumb CMC joint.    PMFS History: Patient Active Problem List   Diagnosis Date Noted   Ganglion cyst 03/27/2024   Dysuria 03/27/2024   Loss of transverse plantar arch 02/05/2024  Bronchitis 01/07/2024   Onychomycosis 11/05/2023   CRI (chronic renal insufficiency) 07/16/2023   Arthritis of left midfoot 06/11/2023   Sinus tarsi syndrome of left ankle 04/03/2023   Sinus tarsi syndrome of right ankle 02/18/2023   Edema 02/17/2023   Acute prerenal azotemia 02/06/2023   TIA (transient ischemic attack) 02/05/2023   Paresthesia 08/28/2022   Chronic neck pain 08/28/2022   Neuropathy 08/21/2022   Vitamin deficiency 10/23/2021   Hypoglycemia 10/07/2019   Heart disease 01/29/2019   Sensorineural hearing loss (SNHL) of both ears 01/27/2019   Primary osteoarthritis of both knees 12/19/2018   DDD (degenerative disc disease), lumbar 12/19/2018   ANA positive 10/27/2018   Anxiety 02/17/2018    Peripheral arterial disease (HCC) 02/20/2017   Skin lesion of face 01/28/2017   Benign paroxysmal positional vertigo 08/05/2016   Antiplatelet or antithrombotic long-term use 03/27/2016   Right lumbar radiculopathy 02/21/2016   Abnormality of gait 02/21/2016   Zinc deficiency 11/26/2015   Right hip pain 10/26/2015   Medicare annual wellness visit, subsequent 03/13/2015   Preventative health care 03/13/2015   Sun-damaged skin 12/05/2014   Angina of effort (HCC) 12/02/2014   Hx of colonic polyps 09/28/2014   Benign neoplasm of descending colon 09/28/2014   Other fatigue 09/10/2014   History of colonic polyps 07/16/2014   Bilateral thumb pain 03/20/2014   Postsurgical hypoparathyroidism (HCC) 08/03/2013   Neck pain on right side 07/13/2013   Chronic constipation 05/31/2013   Back pain 05/31/2013   Nocturia 05/31/2013   GERD (gastroesophageal reflux disease) 11/02/2012   Tension headache 11/02/2012   Anemia 06/11/2012   Hypokalemia 06/11/2012   Depression 01/21/2012   Headache 12/23/2011   Sinus bradycardia 12/07/2011   Hypocalcemia 12/07/2011   Acute MI, subendocardial (HCC) 11/20/2011   Acquired hypothyroidism 05/20/2009   Essential hypertension, benign 05/20/2009   Hyperlipidemia 03/08/2009   CAD, NATIVE VESSEL 03/08/2009   Past Medical History:  Diagnosis Date   Acute MI, subendocardial (HCC)    Arthritis    Back pain 05/31/2013   Benign paroxysmal positional vertigo 08/05/2016   CAD (coronary artery disease)    a. NSTEMI 10/2011: LHC 11/19/11: pLAD 30%, oOM 60%, AVCFX 30%, CFX after OM2 30%, pRCA 60 and 70%, then 99%, AM 80-90% with TIMI 3 flow.  PCI: Promus DES x 2 to RCA; b. 06/2012 Cath: patent RCA stents w/ subtl occl of Acute Marginal (jailed)->Med rx; c. 05/2015 MV: EF 59%, mod mid infsept/inf/ap lat/ap infarct with peri-infarct isch-->Med Rx; d. 02/2016 Cath: LM nl, LAD 85m, RI 50, RCA patent stents.   Colitis    Facial skin lesion 01/28/2017   GERD (gastroesophageal  reflux disease)    occasional   Hyperlipidemia    Hypertension    Hypertensive heart disease    a. Echocardiogram 11/19/11: Difficult acoustic windows, EF 60-65%, normal LV wall thickness, grade 1 diastolic dysfunction   Hypoparathyroidism (HCC)    Low zinc level 11/26/2016   Multinodular thyroid    Goiter s/p thyroidectomy in 2007 with post-op  hypocalcemia and post-op hypothyroidism/hypoparathyroidism with hypocalcemia   Neck pain on right side 07/13/2013   Nocturia 05/31/2013   Osteoarthritis    Pain in right axilla 03/13/2016   Palpitations    a. 03/2012 - patient set up for event monitor but did not wear correctly -  she declined wearing a repeat monitor   Post-surgical hypothyroidism    Sun-damaged skin 12/05/2014   Tubular adenoma of colon 06/2011   Unspecified constipation 05/31/2013   Vertigo  Zinc deficiency 11/26/2015    Family History  Problem Relation Age of Onset   Colon cancer Brother    Cancer Brother        COLON   Hypertension Father    Heart disease Father    Heart attack Father    Blindness Sister    Congestive Heart Failure Sister    Hypertension Sister    Healthy Daughter    Healthy Son    Thyroid disease Sister    Cancer Brother        multiple myelomas   Healthy Daughter    Healthy Daughter    Diabetes Neg Hx    Prostate cancer Neg Hx    Breast cancer Neg Hx    Esophageal cancer Neg Hx    Rectal cancer Neg Hx    Stomach cancer Neg Hx     Past Surgical History:  Procedure Laterality Date   CARDIAC CATHETERIZATION N/A 03/08/2016   Procedure: Left Heart Cath and Coronary Angiography;  Surgeon: Corky Crafts, MD;  Location: [facility] INVASIVE CV [facility];  Service: Cardiovascular;  Laterality: N/A;   COLONOSCOPY  Aenomatous polyps   07/05/2011   COLONOSCOPY N/A 09/28/2014   Procedure: COLONOSCOPY;  Surgeon: Meryl Dare, MD;  Location: [facility] ENDOS[facility];  Service: Endoscopy;  Laterality: N/A;   CORONARY ANGIOPLASTY WITH STENT PLACEMENT     LEFT HEART CATH  AND CORONARY ANGIOGRAPHY N/A 12/09/2018   Procedure: LEFT HEART CATH AND CORONARY ANGIOGRAPHY;  Surgeon: Corky Crafts, MD;  Location: [facility] INVASIVE CV LAB;  Service: Cardiovascular;  Laterality: N/A;   LEFT HEART CATHETERIZATION WITH CORONARY ANGIOGRAM N/A 07/17/2012   Procedure: LEFT HEART CATHETERIZATION WITH CORONARY ANGIOGRAM;  Surgeon: Herby Abraham, MD;  Location: [facility] CATH LAB;  Service: Cardiovascular;  Laterality: N/A;   PERCUTANEOUS CORONARY STENT INTERVENTION (PCI-S) N/A 11/19/2011   Procedure: PERCUTANEOUS CORONARY STENT INTERVENTION (PCI-S);  Surgeon: Herby Abraham, MD;  Location: [facility] CATH LAB;  Service: Cardiovascular;  Laterality: N/A;   POLYPECTOMY     SHOULDER ARTHROSCOPY W/ ROTATOR CUFF REPAIR     right   TOTAL THYROIDECTOMY  2007   GOITER   Social History   Occupational History   Occupation: Airline pilot person at [employer]  Tobacco Use   Smoking status: Never   Smokeless tobacco: Never  Vaping Use   Vaping status: Never Used  Substance and Sexual Activity   Alcohol use: Not Currently   Drug use: No   Sexual activity: Not Currently

## 2024-04-05 NOTE — Assessment & Plan Note (Signed)
 Hydrate and monitor

## 2024-04-05 NOTE — Assessment & Plan Note (Signed)
 Monitor and report any concerns, no changes to meds. Encouraged heart healthy diet such as the DASH diet and exercise as tolerated.  ?

## 2024-04-05 NOTE — Assessment & Plan Note (Signed)
 Supplement and monitor

## 2024-04-07 ENCOUNTER — Encounter: Payer: Self-pay | Admitting: Family Medicine

## 2024-04-07 ENCOUNTER — Telehealth (INDEPENDENT_AMBULATORY_CARE_PROVIDER_SITE_OTHER): Payer: 59 | Admitting: Family Medicine

## 2024-04-07 VITALS — BP 128/56 | HR 65

## 2024-04-07 DIAGNOSIS — I10 Essential (primary) hypertension: Secondary | ICD-10-CM | POA: Diagnosis not present

## 2024-04-07 DIAGNOSIS — N189 Chronic kidney disease, unspecified: Secondary | ICD-10-CM | POA: Diagnosis not present

## 2024-04-07 DIAGNOSIS — R300 Dysuria: Secondary | ICD-10-CM | POA: Diagnosis not present

## 2024-04-07 DIAGNOSIS — E60 Dietary zinc deficiency: Secondary | ICD-10-CM

## 2024-04-07 MED ORDER — PREMARIN 0.625 MG/GM VA CREA: TOPICAL_CREAM | VAGINAL | 1 refills | Status: DC

## 2024-04-07 NOTE — Progress Notes (Signed)
 MyChart Video Visit    Virtual Visit via Video Note   This patient is at least at moderate risk for complications without adequate follow up. This format is felt to be most appropriate for this patient at this time. Physical exam was limited by quality of the video and audio technology used for the visit. Juanetta, CMA was able to get the patient set up on a video visit.  Patient location: home Patient and provider in visit Provider location: Office  I discussed the limitations of evaluation and management by telemedicine and the availability of in person appointments. The patient expressed understanding and agreed to proceed.  Visit Date: 04/07/2024  Today's healthcare provider: Danise Edge, MD  Subjective:    Patient ID: [name], female    DOB: September 15, 1946, 78 y.o.   MRN: [id number]  Chief Complaint  Patient presents with   Follow-up    Patient feels she is taking too much blood pressure medication and would like to discuss with provider    HPI Discussed the use of AI scribe software for clinical note transcription with the patient, who gave verbal consent to proceed.  History of Present Illness [name] is a 78 year old female who presents with concerns about a ganglion cyst and medication effects on kidney function.  She has a ganglion cyst on her wrist, previously evaluated by an orthopedic doctor who suggested surgery. She experiences occasional pain in her finger, but the cyst is currently not tender and can be touched without discomfort. She has been applying castor oil to the cyst, which she believes has reduced its size.  She is concerned about the impact of her blood pressure medications on her kidney function. She is currently taking amlodipine, bisoprolol, isosorbide mononitrate, and hydralazine, using hydralazine only when her blood pressure is very high. Recent blood work showed stable kidney function with a GFR of 53.  She experienced itching and  burning during urination last week, with no infection found upon consultation. She still notices bubbles in her urine and occasional burning. No fever, chills, back pain, or abdominal pain.  She experiences swelling in her feet when standing for long periods and uses compression socks. She inquires about the safe use of Lasix for this issue, which she uses occasionally.    Past Medical History:  Diagnosis Date   Acute MI, subendocardial (HCC)    Arthritis    Back pain 05/31/2013   Benign paroxysmal positional vertigo 08/05/2016   CAD (coronary artery disease)    a. NSTEMI 10/2011: LHC 11/19/11: pLAD 30%, oOM 60%, AVCFX 30%, CFX after OM2 30%, pRCA 60 and 70%, then 99%, AM 80-90% with TIMI 3 flow.  PCI: Promus DES x 2 to RCA; b. 06/2012 Cath: patent RCA stents w/ subtl occl of Acute Marginal (jailed)->Med rx; c. 05/2015 MV: EF 59%, mod mid infsept/inf/ap lat/ap infarct with peri-infarct isch-->Med Rx; d. 02/2016 Cath: LM nl, LAD 43m, RI 50, RCA patent stents.   Colitis    Facial skin lesion 01/28/2017   GERD (gastroesophageal reflux disease)    occasional   Hyperlipidemia    Hypertension    Hypertensive heart disease    a. Echocardiogram 11/19/11: Difficult acoustic windows, EF 60-65%, normal LV wall thickness, grade 1 diastolic dysfunction   Hypoparathyroidism (HCC)    Low zinc level 11/26/2016   Multinodular thyroid    Goiter s/p thyroidectomy in 2007 with post-op  hypocalcemia and post-op hypothyroidism/hypoparathyroidism with hypocalcemia   Neck pain on right side  07/13/2013   Nocturia 05/31/2013   Osteoarthritis    Pain in right axilla 03/13/2016   Palpitations    a. 03/2012 - patient set up for event monitor but did not wear correctly -  she declined wearing a repeat monitor   Post-surgical hypothyroidism    Sun-damaged skin 12/05/2014   Tubular adenoma of colon 06/2011   Unspecified constipation 05/31/2013   Vertigo    Zinc deficiency 11/26/2015    Past Surgical History:  Procedure  Laterality Date   CARDIAC CATHETERIZATION N/A 03/08/2016   Procedure: Left Heart Cath and Coronary Angiography;  Surgeon: Lucendia Rusk, MD;  Location: [facility] INVASIVE CV LAB;  Service: Cardiovascular;  Laterality: N/A;   COLONOSCOPY  Aenomatous polyps   07/05/2011   COLONOSCOPY N/A 09/28/2014   Procedure: COLONOSCOPY;  Surgeon: Asencion Blacksmith, MD;  Location: [facility] ENDOSCOPY;  Service: Endoscopy;  Laterality: N/A;   CORONARY ANGIOPLASTY WITH STENT PLACEMENT     LEFT HEART CATH AND CORONARY ANGIOGRAPHY N/A 12/09/2018   Procedure: LEFT HEART CATH AND CORONARY ANGIOGRAPHY;  Surgeon: Lucendia Rusk, MD;  Location: [facility] INVASIVE CV LAB;  Service: Cardiovascular;  Laterality: N/A;   LEFT HEART CATHETERIZATION WITH CORONARY ANGIOGRAM N/A 07/17/2012   Procedure: LEFT HEART CATHETERIZATION WITH CORONARY ANGIOGRAM;  Surgeon: Kristopher Pheasant, MD;  Location: [facility] CATH LAB;  Service: Cardiovascular;  Laterality: N/A;   PERCUTANEOUS CORONARY STENT INTERVENTION (PCI-S) N/A 11/19/2011   Procedure: PERCUTANEOUS CORONARY STENT INTERVENTION (PCI-S);  Surgeon: Kristopher Pheasant, MD;  Location: [facility] CATH LAB;  Service: Cardiovascular;  Laterality: N/A;   POLYPECTOMY     SHOULDER ARTHROSCOPY W/ ROTATOR CUFF REPAIR     right   TOTAL THYROIDECTOMY  2007   GOITER    Family History  Problem Relation Age of Onset   Colon cancer Brother    Cancer Brother        COLON   Hypertension Father    Heart disease Father    Heart attack Father    Blindness Sister    Congestive Heart Failure Sister    Hypertension Sister    Healthy Daughter    Healthy Son    Thyroid disease Sister    Cancer Brother        multiple myelomas   Healthy Daughter    Healthy Daughter    Diabetes Neg Hx    Prostate cancer Neg Hx    Breast cancer Neg Hx    Esophageal cancer Neg Hx    Rectal cancer Neg Hx    Stomach cancer Neg Hx     Social History   Socioeconomic History   Marital status: Married    Spouse name: Not on file    Number of children: 4   Years of education: 14   Highest education level: Not on file  Occupational History   Occupation: Sales person at [employer]  Tobacco Use   Smoking status: Never   Smokeless tobacco: Never  Vaping Use   Vaping status: Never Used  Substance and Sexual Activity   Alcohol use: Not Currently   Drug use: No   Sexual activity: Not Currently  Other Topics Concern   Not on file  Social History Narrative   Lives at home alone.  Her son lives near her.   Right-handed.   1 cup coffee per day.   Social Drivers of Health   Financial Resource Strain: Low Risk  (05/07/2022)   Overall Financial Resource Strain (CARDIA)    Difficulty of Paying Living  Expenses: Not hard at all  Food Insecurity: No Food Insecurity (06/05/2023)   Hunger Vital Sign    Worried About Running Out of Food in the Last Year: Never true    Ran Out of Food in the Last Year: Never true  Transportation Needs: No Transportation Needs (06/05/2023)   PRAPARE - Administrator, Civil Service (Medical): No    Lack of Transportation (Non-Medical): No  Physical Activity: Sufficiently Active (05/07/2022)   Exercise Vital Sign    Days of Exercise per Week: 7 days    Minutes of Exercise per Session: 30 min  Stress: No Stress Concern Present (05/07/2022)   [employer] of Occupational Health - Occupational Stress Questionnaire    Feeling of Stress : Not at all  Social Connections: Unknown (08/22/2022)   Received from [facility]   Social Network    Social Network: Not on file  Intimate Partner Violence: Not At Risk (02/06/2023)   Humiliation, Afraid, Rape, and Kick questionnaire    Fear of Current or Ex-Partner: No    Emotionally Abused: No    Physically Abused: No    Sexually Abused: No    Outpatient Medications Prior to Visit  Medication Sig Dispense Refill   albuterol (VENTOLIN HFA) 108 (90 Base) MCG/ACT inhaler Inhale 2 puffs into the lungs every 6 (six) hours as needed for  wheezing or shortness of breath. 8 g 0   amLODipine (NORVASC) 10 MG tablet Take 1 tablet (10 mg total) by mouth daily. 90 tablet 2   aspirin EC 81 MG tablet Take 81 mg by mouth daily.     B Complex-C (B-COMPLEX WITH VITAMIN C) tablet Take 1 tablet by mouth daily.     Bempedoic Acid (NEXLETOL) 180 MG TABS Take 1 tablet (180 mg total) by mouth daily. 30 tablet 11   bisoprolol (ZEBETA) 5 MG tablet Take 0.5 tablets (2.5 mg total) by mouth daily. 45 tablet 1   calcitRIOL (ROCALTROL) 0.25 MCG capsule TAKE 3 CAPSULES BY MOUTH DAILY 300 capsule 3   calcium carbonate (TUMS) 500 MG chewable tablet Chew 2 tablets (400 mg of elemental calcium total) by mouth 2 (two) times daily.     Cholecalciferol (VITAMIN D) 125 MCG (5000 UT) CAPS Take 5,000 Units by mouth daily in the afternoon. 100 capsule 3   ciclopirox (PENLAC) 8 % solution Apply topically at bedtime. Apply over nail and surrounding skin. Apply daily over previous coat. After seven (7) days, may remove with alcohol and continue cycle. 6.6 mL 0   clopidogrel (PLAVIX) 75 MG tablet Take 1 tablet (75 mg total) by mouth daily. 90 tablet 3   furosemide (LASIX) 20 MG tablet TAKE 1 TABLET BY MOUTH ONCE  WEEKLY AS NEEDED 15 tablet 3   hydrALAZINE (APRESOLINE) 10 MG tablet TAKE 1/2 TABLET (5 MG) BY MOUTH AS NEEDED FOR BP ABOVE 160 OR GREATER 30 tablet 6   hydrocortisone (ANUSOL-HC) 2.5 % rectal cream Place 1 Application rectally 2 (two) times daily. 30 g 1   isosorbide mononitrate (IMDUR) 60 MG 24 hr tablet TAKE 1 TABLET BY MOUTH DAILY 90 tablet 3   levothyroxine (SYNTHROID) 112 MCG tablet Take 1 tablet (112 mcg total) by mouth 3 (three) times a week. 90 tablet 1   Multiple Vitamin (MULTIVITAMIN WITH MINERALS) TABS tablet Take 1 tablet by mouth daily.     nitroGLYCERIN (NITROSTAT) 0.4 MG SL tablet DISSOLVE 1 TABLET UNDER THE  TONGUE EVERY 5 MINUTES AS NEEDED FOR CHEST PAIN.  MAX OF 3 TABLETS IN 15 MINUTES. CALL 911 IF PAIN  PERSISTS. 100 tablet 3   pantoprazole  (PROTONIX) 40 MG tablet TAKE 1 TABLET BY MOUTH DAILY 100 tablet 2   REPATHA SURECLICK 140 MG/ML SOAJ INJECT 140MG  SUBCUTANEOUSLY  EVERY 2 WEEKS 6 mL 3   urea (CARMOL) 10 % cream Apply topically as needed. 71 g 0   rosuvastatin (CRESTOR) 20 MG tablet Take 1 tablet (20 mg total) by mouth daily. 90 tablet 3   No facility-administered medications prior to visit.    Allergies  Allergen Reactions   Zetia [Ezetimibe] Other (See Comments)    Myalgia, paresthesias and weakness   Penicillin G Rash    Review of Systems  Constitutional:  Negative for fever and malaise/fatigue.  HENT:  Negative for congestion.   Eyes:  Negative for blurred vision.  Respiratory:  Negative for shortness of breath.   Cardiovascular:  Positive for leg swelling. Negative for chest pain and palpitations.  Gastrointestinal:  Negative for abdominal pain, blood in stool and nausea.  Genitourinary:  Positive for dysuria, frequency and urgency.  Musculoskeletal:  Positive for back pain. Negative for falls.  Skin:  Negative for rash.  Neurological:  Negative for dizziness, loss of consciousness and headaches.  Endo/Heme/Allergies:  Negative for environmental allergies.  Psychiatric/Behavioral:  Negative for depression. The patient is nervous/anxious.        Objective:    Physical Exam Constitutional:      General: She is not in acute distress.    Appearance: Normal appearance. She is not ill-appearing or toxic-appearing.  HENT:     Head: Normocephalic and atraumatic.     Right Ear: External ear normal.     Left Ear: External ear normal.     Nose: Nose normal.  Eyes:     General:        Right eye: No discharge.        Left eye: No discharge.  Pulmonary:     Effort: Pulmonary effort is normal.  Musculoskeletal:        General: Swelling present.     Comments: Small raised cyst palmar surface of wrist. No erythema  Skin:    Findings: No rash.  Neurological:     Mental Status: She is alert and oriented to  person, place, and time.  Psychiatric:        Behavior: Behavior normal.     BP (!) 128/56 Comment: Obained by patient  Pulse 65 Comment: Obtained by patient Wt Readings from Last 3 Encounters:  03/27/24 190 lb (86.2 kg)  02/06/24 196 lb (88.9 kg)  01/21/24 190 lb (86.2 kg)    Diabetic Foot Exam - Simple   No data filed    Lab Results  Component Value Date   WBC 7.5 01/28/2024   HGB 13.6 01/28/2024   HCT 41.4 01/28/2024   PLT 249.0 01/28/2024   GLUCOSE 108 (H) 02/26/2024   CHOL 137 01/28/2024   TRIG 181.0 (H) 01/28/2024   HDL 50.20 01/28/2024   LDLDIRECT 96.0 11/06/2022   LDLCALC 51 01/28/2024   ALT 13 02/26/2024   AST 16 02/26/2024   NA 141 02/26/2024   K 4.3 02/26/2024   CL 99 02/26/2024   CREATININE 1.07 02/26/2024   BUN 21 02/26/2024   CO2 32 02/26/2024   TSH 2.44 01/28/2024   INR 0.9 02/05/2023   HGBA1C 5.5 10/15/2023    Lab Results  Component Value Date   TSH 2.44 01/28/2024   Lab Results  Component Value Date   WBC 7.5 01/28/2024   HGB 13.6 01/28/2024   HCT 41.4 01/28/2024   MCV 86.9 01/28/2024   PLT 249.0 01/28/2024   Lab Results  Component Value Date   NA 141 02/26/2024   K 4.3 02/26/2024   CO2 32 02/26/2024   GLUCOSE 108 (H) 02/26/2024   BUN 21 02/26/2024   CREATININE 1.07 02/26/2024   BILITOT 0.4 02/26/2024   ALKPHOS 95 02/26/2024   AST 16 02/26/2024   ALT 13 02/26/2024   PROT 7.3 02/26/2024   ALBUMIN 4.1 02/26/2024   CALCIUM 9.3 02/26/2024   ANIONGAP 12 10/22/2023   EGFR 49 (L) 10/15/2023   GFR 50.28 (L) 02/26/2024   Lab Results  Component Value Date   CHOL 137 01/28/2024   Lab Results  Component Value Date   HDL 50.20 01/28/2024   Lab Results  Component Value Date   LDLCALC 51 01/28/2024   Lab Results  Component Value Date   TRIG 181.0 (H) 01/28/2024   Lab Results  Component Value Date   CHOLHDL 3 01/28/2024   Lab Results  Component Value Date   HGBA1C 5.5 10/15/2023       Assessment & Plan:  Chronic  renal impairment, unspecified CKD stage Assessment & Plan: Hydrate and monitor    Essential hypertension, benign Assessment & Plan: Monitor and report any concerns, no changes to meds. Encouraged heart healthy diet such as the DASH diet and exercise as tolerated.     Zinc deficiency Assessment & Plan: Supplement and monitor    Dysuria -     Urinalysis, Routine w reflex microscopic; Future -     Urine Culture; Future  Other orders -     Premarin; Apply small amount to vaginal mucosa twice weekly  Dispense: 42.5 g; Refill: 1    Assessment and Plan Assessment & Plan Essential hypertension Current medications are appropriate for managing hypertension and cardioprotection without renal harm. - Continue current antihypertensive regimen. - Monitor blood pressure regularly. - Reassured safety of medications for renal function.  Chronic kidney disease, stage 3 GFR is 53, slightly below normal but not unexpected. Emphasized controlling hypertension, maintaining hydration, and regular monitoring to slow progression. - Monitor renal function with blood work every 8-10 weeks. - Maintain hydration and blood pressure control. - Avoid unnecessary OTC medications.  Peripheral edema Experiences pedal edema when standing for prolonged periods. Advised using compression stockings and elevating feet. - Use compression stockings and elevate feet. - Take Lasix as needed, no more than once or twice weekly.  Ganglion cyst Asymptomatic wrist ganglion cyst, likely due to repetitive motion. Castor oil massage appears effective. - Continue castor oil massage if effective. - Consider lidocaine gel for tenderness.  Atrophic vaginitis Experiences pruritus and burning, likely due to postmenopausal atrophic vaginitis. Recommended hormone cream to alleviate symptoms. - Prescribe Premarin cream, apply twice weekly. - Advise price comparison and GoodRx use.  Urinary symptoms Reports occasional  dysuria and bubbles in urine. Previous tests showed no infection. Agreed to repeat urinalysis. - Order repeat urinalysis to check for infection.  Degeneration of intervertebral disc of lumbar region Reports lumbar and knee pain. Physical therapy including dry needling is beneficial. - Continue physical therapy as long as beneficial and covered by insurance.  Follow-up Scheduled for follow-up in June, including blood work to monitor renal function and other health parameters. - Schedule follow-up in June for blood work and routine check-up.     Danise Edge, MD

## 2024-04-09 NOTE — Progress Notes (Deleted)
 [name] [address] [place] [address] Phone: [phone] Subjective:    I'm seeing this patient by the request  of:  Taylor Balk, MD  CC: Right foot pain  [id number]  02/05/2024 Normal breakdown of the transverse arch following bunion and bunionette formation. Discussed lacing shoe differently. Discussed icing regimen and home exercises, discussed which activities to do and which ones to avoid. Discussed proper shoes, over-the-counter orthotics. Metatarsal pad added to which insole she is wearing today. Follow-up again in 2 months to see how patient is responding. May need custom orthotics.   Update 04/14/2204 [name] is a 78 y.o. female coming in with complaint of loss of transverse arch of right foot. Patient states   Patient is on the right wrist going to have removal of a ganglion cyst.     Past Medical History:  Diagnosis Date   Acute MI, subendocardial (HCC)    Arthritis    Back pain 05/31/2013   Benign paroxysmal positional vertigo 08/05/2016   CAD (coronary artery disease)    a. NSTEMI 10/2011: LHC 11/19/11: pLAD 30%, oOM 60%, AVCFX 30%, CFX after OM2 30%, pRCA 60 and 70%, then 99%, AM 80-90% with TIMI 3 flow.  PCI: Promus DES x 2 to RCA; b. 06/2012 Cath: patent RCA stents w/ subtl occl of Acute Marginal (jailed)->Med rx; c. 05/2015 MV: EF 59%, mod mid infsept/inf/ap lat/ap infarct with peri-infarct isch-->Med Rx; d. 02/2016 Cath: LM nl, LAD 9m, RI 50, RCA patent stents.   Colitis    Facial skin lesion 01/28/2017   GERD (gastroesophageal reflux disease)    occasional   Hyperlipidemia    Hypertension    Hypertensive heart disease    a. Echocardiogram 11/19/11: Difficult acoustic windows, EF 60-65%, normal LV wall thickness, grade 1 diastolic dysfunction   Hypoparathyroidism (HCC)    Low zinc  level 11/26/2016   Multinodular thyroid     Goiter s/p thyroidectomy in 2007 with post-op  hypocalcemia and post-op  hypothyroidism/hypoparathyroidism with hypocalcemia   Neck pain on right side 07/13/2013   Nocturia 05/31/2013   Osteoarthritis    Pain in right axilla 03/13/2016   Palpitations    a. 03/2012 - patient set up for event monitor but did not wear correctly -  she declined wearing a repeat monitor   Post-surgical hypothyroidism    Sun-damaged skin 12/05/2014   Tubular adenoma of colon 06/2011   Unspecified constipation 05/31/2013   Vertigo    Zinc  deficiency 11/26/2015   Past Surgical History:  Procedure Laterality Date   CARDIAC CATHETERIZATION N/A 03/08/2016   Procedure: Left Heart Cath and Coronary Angiography;  Surgeon: Lucendia Rusk, MD;  Location: [facility] INVASIVE CV LAB;  Service: Cardiovascular;  Laterality: N/A;   COLONOSCOPY  Aenomatous polyps   07/05/2011   COLONOSCOPY N/A 09/28/2014   Procedure: COLONOSCOPY;  Surgeon: Asencion Blacksmith, MD;  Location: [facility] ENDOSCOPY;  Service: Endoscopy;  Laterality: N/A;   CORONARY ANGIOPLASTY WITH STENT PLACEMENT     LEFT HEART CATH AND CORONARY ANGIOGRAPHY N/A 12/09/2018   Procedure: LEFT HEART CATH AND CORONARY ANGIOGRAPHY;  Surgeon: Lucendia Rusk, MD;  Location: [facility] INVASIVE CV LAB;  Service: Cardiovascular;  Laterality: N/A;   LEFT HEART CATHETERIZATION WITH CORONARY ANGIOGRAM N/A 07/17/2012   Procedure: LEFT HEART CATHETERIZATION WITH CORONARY ANGIOGRAM;  Surgeon: Kristopher Pheasant, MD;  Location: [facility] CATH LAB;  Service: Cardiovascular;  Laterality: N/A;   PERCUTANEOUS CORONARY STENT INTERVENTION (PCI-S) N/A 11/19/2011   Procedure: PERCUTANEOUS  CORONARY STENT INTERVENTION (PCI-S);  Surgeon: Kristopher Pheasant, MD;  Location: [facility] CATH LAB;  Service: Cardiovascular;  Laterality: N/A;   POLYPECTOMY     SHOULDER ARTHROSCOPY W/ ROTATOR CUFF REPAIR     right   TOTAL THYROIDECTOMY  2007   GOITER   Social History   Socioeconomic History   Marital status: Married    Spouse name: Not on file   Number of children: 4   Years of education: 14   Highest  education level: Not on file  Occupational History   Occupation: Sales person at [employer]  Tobacco Use   Smoking status: Never   Smokeless tobacco: Never  Vaping Use   Vaping status: Never Used  Substance and Sexual Activity   Alcohol use: Not Currently   Drug use: No   Sexual activity: Not Currently  Other Topics Concern   Not on file  Social History Narrative   Lives at home alone.  Her son lives near her.   Right-handed.   1 cup coffee per day.   Social Drivers of Corporate investment banker Strain: Low Risk  (05/07/2022)   Overall Financial Resource Strain (CARDIA)    Difficulty of Paying Living Expenses: Not hard at all  Food Insecurity: No Food Insecurity (06/05/2023)   Hunger Vital Sign    Worried About Running Out of Food in the Last Year: Never true    Ran Out of Food in the Last Year: Never true  Transportation Needs: No Transportation Needs (06/05/2023)   PRAPARE - Administrator, Civil Service (Medical): No    Lack of Transportation (Non-Medical): No  Physical Activity: Sufficiently Active (05/07/2022)   Exercise Vital Sign    Days of Exercise per Week: 7 days    Minutes of Exercise per Session: 30 min  Stress: No Stress Concern Present (05/07/2022)   [employer] of Occupational Health - Occupational Stress Questionnaire    Feeling of Stress : Not at all  Social Connections: Unknown (08/22/2022)   Received from [employer], [facility]   Social Network    Social Network: Not on file   Allergies  Allergen Reactions   Zetia  [Ezetimibe ] Other (See Comments)    Myalgia, paresthesias and weakness   Penicillin G Rash   Family History  Problem Relation Age of Onset   Colon cancer Brother    Cancer Brother        COLON   Hypertension Father    Heart disease Father    Heart attack Father    Blindness Sister    Congestive Heart Failure Sister    Hypertension Sister    Healthy Daughter    Healthy Son    Thyroid  disease Sister    Cancer  Brother        multiple myelomas   Healthy Daughter    Healthy Daughter    Diabetes Neg Hx    Prostate cancer Neg Hx    Breast cancer Neg Hx    Esophageal cancer Neg Hx    Rectal cancer Neg Hx    Stomach cancer Neg Hx     Current Outpatient Medications (Endocrine & Metabolic):    calcitRIOL  (ROCALTROL ) 0.25 MCG capsule, TAKE 3 CAPSULES BY MOUTH DAILY   levothyroxine  (SYNTHROID ) 112 MCG tablet, Take 1 tablet (112 mcg total) by mouth 3 (three) times a week.  Current Outpatient Medications (Cardiovascular):    amLODipine  (NORVASC ) 10 MG tablet, Take 1 tablet (10 mg total) by mouth daily.  Bempedoic Acid  (NEXLETOL ) 180 MG TABS, Take 1 tablet (180 mg total) by mouth daily.   bisoprolol  (ZEBETA ) 5 MG tablet, Take 0.5 tablets (2.5 mg total) by mouth daily.   furosemide  (LASIX ) 20 MG tablet, TAKE 1 TABLET BY MOUTH ONCE  WEEKLY AS NEEDED   hydrALAZINE  (APRESOLINE ) 10 MG tablet, TAKE 1/2 TABLET (5 MG) BY MOUTH AS NEEDED FOR BP ABOVE 160 OR GREATER   isosorbide  mononitrate (IMDUR ) 60 MG 24 hr tablet, TAKE 1 TABLET BY MOUTH DAILY   nitroGLYCERIN  (NITROSTAT ) 0.4 MG SL tablet, DISSOLVE 1 TABLET UNDER THE  TONGUE EVERY 5 MINUTES AS NEEDED FOR CHEST PAIN. MAX OF 3 TABLETS IN 15 MINUTES. CALL 911 IF PAIN  PERSISTS.   REPATHA  SURECLICK 140 MG/ML SOAJ, INJECT 140MG  SUBCUTANEOUSLY  EVERY 2 WEEKS   rosuvastatin  (CRESTOR ) 20 MG tablet, Take 1 tablet (20 mg total) by mouth daily.  Current Outpatient Medications (Respiratory):    albuterol  (VENTOLIN  HFA) 108 (90 Base) MCG/ACT inhaler, Inhale 2 puffs into the lungs every 6 (six) hours as needed for wheezing or shortness of breath.  Current Outpatient Medications (Analgesics):    aspirin  EC 81 MG tablet, Take 81 mg by mouth daily.  Current Outpatient Medications (Hematological):    clopidogrel  (PLAVIX ) 75 MG tablet, Take 1 tablet (75 mg total) by mouth daily.  Current Outpatient Medications (Other):    B Complex-C (B-COMPLEX WITH VITAMIN C) tablet, Take  1 tablet by mouth daily.   calcium  carbonate (TUMS) 500 MG chewable tablet, Chew 2 tablets (400 mg of elemental calcium  total) by mouth 2 (two) times daily.   Cholecalciferol  (VITAMIN D ) 125 MCG (5000 UT) CAPS, Take 5,000 Units by mouth daily in the afternoon.   ciclopirox  (PENLAC ) 8 % solution, Apply topically at bedtime. Apply over nail and surrounding skin. Apply daily over previous coat. After seven (7) days, may remove with alcohol and continue cycle.   conjugated estrogens  (PREMARIN ) vaginal cream, Apply small amount to vaginal mucosa twice weekly   hydrocortisone  (ANUSOL -HC) 2.5 % rectal cream, Place 1 Application rectally 2 (two) times daily.   Multiple Vitamin (MULTIVITAMIN WITH MINERALS) TABS tablet, Take 1 tablet by mouth daily.   pantoprazole  (PROTONIX ) 40 MG tablet, TAKE 1 TABLET BY MOUTH DAILY   urea  (CARMOL) 10 % cream, Apply topically as needed.   Reviewed prior external information including notes and imaging from  primary care provider As well as notes that were available from care everywhere and other healthcare systems.  Past medical history, social, surgical and family history all reviewed in electronic medical record.  No pertanent information unless stated regarding to the chief complaint.   Review of Systems:  No headache, visual changes, nausea, vomiting, diarrhea, constipation, dizziness, abdominal pain, skin rash, fevers, chills, night sweats, weight loss, swollen lymph nodes, body aches, joint swelling, chest pain, shortness of breath, mood changes. POSITIVE muscle aches  Objective  There were no vitals taken for this visit.   General: No apparent distress alert and oriented x3 mood and affect normal, dressed appropriately.  HEENT: Pupils equal, extraocular movements intact  Respiratory: Patient's speak in full sentences and does not appear short of breath  Cardiovascular: No lower extremity edema, non tender, no erythema      Impression and Recommendations:

## 2024-04-14 ENCOUNTER — Ambulatory Visit: Payer: 59 | Admitting: Family Medicine

## 2024-04-20 ENCOUNTER — Encounter: Payer: Self-pay | Admitting: Family Medicine

## 2024-04-20 ENCOUNTER — Telehealth: Payer: Self-pay | Admitting: Family Medicine

## 2024-04-20 ENCOUNTER — Other Ambulatory Visit (INDEPENDENT_AMBULATORY_CARE_PROVIDER_SITE_OTHER)

## 2024-04-20 DIAGNOSIS — R300 Dysuria: Secondary | ICD-10-CM | POA: Diagnosis not present

## 2024-04-20 LAB — URINALYSIS, ROUTINE W REFLEX MICROSCOPIC
Bilirubin Urine: NEGATIVE
Hgb urine dipstick: NEGATIVE
Ketones, ur: NEGATIVE
Leukocytes,Ua: NEGATIVE
Nitrite: NEGATIVE
RBC / HPF: NONE SEEN (ref 0–?)
Specific Gravity, Urine: 1.01 (ref 1.000–1.030)
Total Protein, Urine: NEGATIVE
Urine Glucose: NEGATIVE
Urobilinogen, UA: 0.2 (ref 0.0–1.0)
pH: 6 (ref 5.0–8.0)

## 2024-04-20 NOTE — Progress Notes (Signed)
 Specimen provided

## 2024-04-20 NOTE — Telephone Encounter (Signed)
 Pt came in office requesting a referral for physical therapy, pt states had consulted with provider about the referral and that provider was going to put the referral, but no referral in yet. Please advise. Pt tel [phone].

## 2024-04-21 LAB — URINE CULTURE
MICRO NUMBER:: 16382841
Result:: NO GROWTH
SPECIMEN QUALITY:: ADEQUATE

## 2024-04-24 ENCOUNTER — Other Ambulatory Visit: Payer: Self-pay | Admitting: Internal Medicine

## 2024-04-28 ENCOUNTER — Encounter: Payer: Self-pay | Admitting: Family Medicine

## 2024-05-03 NOTE — Progress Notes (Unsigned)
 Cardiology Office Note   Date:  05/04/2024   ID:  [name], DOB 1946-02-22, MRN [id number]  PCP:  Neda Balk, MD  Cardiologist:   Ola Berger, MD   F/U of CAD    History of Present Illness:  [name] is a 78 y.o. female with CAD, HTN, HL, vertigo, GERD  -Nov 2012  NSTEMI  s/p DES x 2 to RCA -Dec 2019   L heart cath showed:  LAD with 25%  OM  with 100% with Left to R  collaterals  RCA with patent stent   Normal LVEF    -Sept 2020   Echo  LVEF and RVEF were normal   Mild diastolic dysfunction   -2023 the pt had some numbness on L face   Seen by  neuro, ultimately felt not to represent a TIA  Echo done   Normal   Carotid USN without severe dz   Symptoms felt to be due to DJD of neck     April 2024  Seen in ER for CP/shoulder pain  BP severely elevated   Symptoms eased with NTG    Oct 15, 2023-  Pt seen in clinic  [place] about 2 wks ago she started having spells where she feels "light"    Not dizzy   Felt like she has to catch her breath.   Episodes last about 1 minute Denies CP   One episode occurred while driving   One woke her from sleep   Felt OK after the spells   Pt set up for an event monitor    Fall 2024 patient has been to ER twice for CP  First visit was on 10/23   She was at home   Had dull pain in chest   BP was very high   180   Took 2 ASA and an NTG.   BP stayed high  Went to ER   There it wa in the 150s  Trops negative   Pain eased off Second visit was on 10/29   She was in bed when she developed a knife like pain in cest   L parasternal   Took ASA and NTG  Pain came back x 3  Brief  Not pleuritic Went to ER  BP 129/  CTA negative  Sent home   I saw the pt in Jan 2025  Since seen she has done well  Breathing is good  She had one episode of CP last night in bed   Took a NTG SL   Went away and went back to sleep     Walks regularly   No SOB or CP with activity     Occasional LE edema   Current Meds  Medication Sig   albuterol  (VENTOLIN  HFA) 108 (90  Base) MCG/ACT inhaler Inhale 2 puffs into the lungs every 6 (six) hours as needed for wheezing or shortness of breath.   amLODipine  (NORVASC ) 10 MG tablet Take 1 tablet (10 mg total) by mouth daily.   aspirin  EC 81 MG tablet Take 81 mg by mouth daily.   B Complex-C (B-COMPLEX WITH VITAMIN C) tablet Take 1 tablet by mouth daily.   Bempedoic Acid  (NEXLETOL ) 180 MG TABS Take 1 tablet (180 mg total) by mouth daily.   bisoprolol  (ZEBETA ) 5 MG tablet Take 0.5 tablets (2.5 mg total) by mouth daily.   calcitRIOL  (ROCALTROL ) 0.25 MCG capsule TAKE 3 CAPSULES BY MOUTH DAILY   calcium  carbonate (TUMS) 500  MG chewable tablet Chew 2 tablets (400 mg of elemental calcium  total) by mouth 2 (two) times daily.   Cholecalciferol  (VITAMIN D ) 125 MCG (5000 UT) CAPS Take 5,000 Units by mouth daily in the afternoon.   ciclopirox  (PENLAC ) 8 % solution Apply topically at bedtime. Apply over nail and surrounding skin. Apply daily over previous coat. After seven (7) days, may remove with alcohol and continue cycle.   clopidogrel  (PLAVIX ) 75 MG tablet Take 1 tablet (75 mg total) by mouth daily.   conjugated estrogens  (PREMARIN ) vaginal cream Apply small amount to vaginal mucosa twice weekly   furosemide  (LASIX ) 20 MG tablet TAKE 1 TABLET BY MOUTH ONCE  WEEKLY AS NEEDED   hydrALAZINE  (APRESOLINE ) 10 MG tablet TAKE 1/2 TABLET (5 MG) BY MOUTH AS NEEDED FOR BP ABOVE 160 OR GREATER   hydrocortisone  (ANUSOL -HC) 2.5 % rectal cream Place 1 Application rectally 2 (two) times daily.   isosorbide  mononitrate (IMDUR ) 60 MG 24 hr tablet TAKE 1 TABLET BY MOUTH DAILY   levothyroxine  (SYNTHROID ) 112 MCG tablet Take 1 tablet (112 mcg total) by mouth 3 (three) times a week.   Multiple Vitamin (MULTIVITAMIN WITH MINERALS) TABS tablet Take 1 tablet by mouth daily.   nitroGLYCERIN  (NITROSTAT ) 0.4 MG SL tablet DISSOLVE 1 TABLET UNDER THE  TONGUE EVERY 5 MINUTES AS NEEDED FOR CHEST PAIN. MAX OF 3 TABLETS IN 15 MINUTES. CALL 911 IF PAIN  PERSISTS.    pantoprazole  (PROTONIX ) 40 MG tablet TAKE 1 TABLET BY MOUTH DAILY   REPATHA  SURECLICK 140 MG/ML SOAJ INJECT 140MG  SUBCUTANEOUSLY  EVERY 2 WEEKS   urea  (CARMOL) 10 % cream Apply topically as needed.     Allergies:   Zetia  [ezetimibe ] and Penicillin g   Past Medical History:  Diagnosis Date   Acute MI, subendocardial (HCC)    Arthritis    Back pain 05/31/2013   Benign paroxysmal positional vertigo 08/05/2016   CAD (coronary artery disease)    a. NSTEMI 10/2011: LHC 11/19/11: pLAD 30%, oOM 60%, AVCFX 30%, CFX after OM2 30%, pRCA 60 and 70%, then 99%, AM 80-90% with TIMI 3 flow.  PCI: Promus DES x 2 to RCA; b. 06/2012 Cath: patent RCA stents w/ subtl occl of Acute Marginal (jailed)->Med rx; c. 05/2015 MV: EF 59%, mod mid infsept/inf/ap lat/ap infarct with peri-infarct isch-->Med Rx; d. 02/2016 Cath: LM nl, LAD 41m, RI 50, RCA patent stents.   Colitis    Facial skin lesion 01/28/2017   GERD (gastroesophageal reflux disease)    occasional   Hyperlipidemia    Hypertension    Hypertensive heart disease    a. Echocardiogram 11/19/11: Difficult acoustic windows, EF 60-65%, normal LV wall thickness, grade 1 diastolic dysfunction   Hypoparathyroidism (HCC)    Low zinc  level 11/26/2016   Multinodular thyroid     Goiter s/p thyroidectomy in 2007 with post-op  hypocalcemia and post-op hypothyroidism/hypoparathyroidism with hypocalcemia   Neck pain on right side 07/13/2013   Nocturia 05/31/2013   Osteoarthritis    Pain in right axilla 03/13/2016   Palpitations    a. 03/2012 - patient set up for event monitor but did not wear correctly -  she declined wearing a repeat monitor   Post-surgical hypothyroidism    Sun-damaged skin 12/05/2014   Tubular adenoma of colon 06/2011   Unspecified constipation 05/31/2013   Vertigo    Zinc  deficiency 11/26/2015    Past Surgical History:  Procedure Laterality Date   CARDIAC CATHETERIZATION N/A 03/08/2016   Procedure: Left Heart Cath and Coronary Angiography;  Surgeon:  Lucendia Rusk, MD;  Location: [facility] INVASIVE CV LAB;  Service: Cardiovascular;  Laterality: N/A;   COLONOSCOPY  Aenomatous polyps   07/05/2011   COLONOSCOPY N/A 09/28/2014   Procedure: COLONOSCOPY;  Surgeon: Asencion Blacksmith, MD;  Location: [facility] ENDOSCOPY;  Service: Endoscopy;  Laterality: N/A;   CORONARY ANGIOPLASTY WITH STENT PLACEMENT     LEFT HEART CATH AND CORONARY ANGIOGRAPHY N/A 12/09/2018   Procedure: LEFT HEART CATH AND CORONARY ANGIOGRAPHY;  Surgeon: Lucendia Rusk, MD;  Location: [facility] INVASIVE CV LAB;  Service: Cardiovascular;  Laterality: N/A;   LEFT HEART CATHETERIZATION WITH CORONARY ANGIOGRAM N/A 07/17/2012   Procedure: LEFT HEART CATHETERIZATION WITH CORONARY ANGIOGRAM;  Surgeon: Kristopher Pheasant, MD;  Location: [facility] CATH LAB;  Service: Cardiovascular;  Laterality: N/A;   PERCUTANEOUS CORONARY STENT INTERVENTION (PCI-S) N/A 11/19/2011   Procedure: PERCUTANEOUS CORONARY STENT INTERVENTION (PCI-S);  Surgeon: Kristopher Pheasant, MD;  Location: [facility] CATH LAB;  Service: Cardiovascular;  Laterality: N/A;   POLYPECTOMY     SHOULDER ARTHROSCOPY W/ ROTATOR CUFF REPAIR     right   TOTAL THYROIDECTOMY  2007   GOITER     Social History:  The patient  reports that she has never smoked. She has never used smokeless tobacco. She reports that she does not currently use alcohol. She reports that she does not use drugs.   Family History:  The patient's family history includes Blindness in her sister; Cancer in her brother and brother; Colon cancer in her brother; Congestive Heart Failure in her sister; Healthy in her daughter, daughter, daughter, and son; Heart attack in her father; Heart disease in her father; Hypertension in her father and sister; Thyroid  disease in her sister.    ROS:  Please see the history of present illness. All other systems are reviewed and  Negative to the above problem except as noted.    PHYSICAL EXAM: VS:  BP 115/67 (BP Location: Left Arm, Patient Position:  Sitting, Cuff Size: Normal) Comment: Left arm 118/60 @ 10:07 am  Pulse (!) 55   Resp 12   Ht 5\' 5"  (1.651 m)   Wt 194 lb 12.8 oz (88.4 kg)   SpO2 96%   BMI 32.42 kg/m   GEN: Obese 78 yo  in no acute distress  HEENT: normal  Neck: JVP is not elevated   No bruits Cardiac: RRR; no murmurs  Respiratory:  clear to auscultation  GI: soft, nontender,no masses, no hepatomegaly  Ex  no LE edema    EKG:  EKG shows SB 52 bpm  RBBB  Echo   Feb 2024   1. Left ventricular ejection fraction, by estimation, is 60 to 65%. The  left ventricle has normal function. The left ventricle has no regional  wall motion abnormalities. Left ventricular diastolic parameters are  consistent with Grade I diastolic  dysfunction (impaired relaxation).   2. Right ventricular systolic function is normal. The right ventricular  size is normal. There is normal pulmonary artery systolic pressure. The  estimated right ventricular systolic pressure is 24.5 mmHg.   3. The mitral valve is normal in structure. No evidence of mitral valve  regurgitation. No evidence of mitral stenosis.   4. The aortic valve is tricuspid. Aortic valve regurgitation is trivial.  Aortic valve sclerosis is present, with no evidence of aortic valve  stenosis. Aortic valve area, by VTI measures 2.64 cm. Aortic valve mean  gradient measures 4.0 mmHg. Aortic  valve Vmax measures 1.33 m/s.   5. The  inferior vena cava is dilated in size with >50% respiratory  variability, suggesting right atrial pressure of 8 mmHg.   Conclusion(s)/Recommendation(s): No intracardiac source of embolism  detected on this transthoracic study. Consider a transesophageal  echocardiogram to exclude cardiac source of embolism if clinically  indicated.   LHC 2019  Previously placed Prox RCA to Dist RCA stent (unknown type) is widely patent. Acute Mrg lesion is 100% stenosed. Left to right collaterals. Ost Ramus lesion is 40% stenosed. Prox LAD lesion is 25%  stenosed. Ost 1st Diag lesion is 25% stenosed. The left ventricular systolic function is normal. LV end diastolic pressure is normal. The left ventricular ejection fraction is 55-65% by visual estimate. There is no aortic valve stenosis.   No change in coronary anatomy from 2017.  Continue medical therapy.     Lipid Panel    Component Value Date/Time   CHOL 137 01/28/2024 1142   CHOL 172 10/11/2022 1050   TRIG 181.0 (H) 01/28/2024 1142   HDL 50.20 01/28/2024 1142   HDL 43 10/11/2022 1050   CHOLHDL 3 01/28/2024 1142   VLDL 36.2 01/28/2024 1142   LDLCALC 51 01/28/2024 1142   LDLCALC 101 (H) 10/11/2022 1050   LDLDIRECT 96.0 11/06/2022 1623      Wt Readings from Last 3 Encounters:  05/04/24 194 lb 12.8 oz (88.4 kg)  03/27/24 190 lb (86.2 kg)  02/06/24 196 lb (88.9 kg)      ASSESSMENT AND PLAN:  1 CAD Remote stents to RCA (2012)  Last LHC in 2019 showed  occluded acute marginal  that fills via collaterals  Patent stents    Follow   Pt has infrequent CPl  3  BP   BP is well controlled   FOllow   4  HL   Seen in lipid clinic  On REpatha , Nexletol , Crestor   LIpids in feb; LDL was excellent,  Trig high   WIll check A1C and insulin    5  Hypocalcemia  Pt with low Ca   6.6 on 01/07/24  WIll recheck  On supplement   To have  CMET, Vit D A1C and Insulin   Follow up in the fall      Current medicines are reviewed at length with the patient today.  The patient does not have concerns regarding medicines.  Signed, Ola Berger, MD  05/04/2024 10:28 AM    [facility] [address] [place], [place], [place]  [address] Phone: [phone]; Fax: [phone]

## 2024-05-04 ENCOUNTER — Ambulatory Visit: Payer: 59 | Attending: Internal Medicine | Admitting: Internal Medicine

## 2024-05-04 ENCOUNTER — Encounter: Admitting: Orthopedic Surgery

## 2024-05-04 VITALS — BP 115/67 | HR 55 | Resp 12 | Ht 65.0 in | Wt 194.8 lb

## 2024-05-04 DIAGNOSIS — R079 Chest pain, unspecified: Secondary | ICD-10-CM

## 2024-05-04 DIAGNOSIS — Z79899 Other long term (current) drug therapy: Secondary | ICD-10-CM

## 2024-05-04 DIAGNOSIS — E559 Vitamin D deficiency, unspecified: Secondary | ICD-10-CM | POA: Diagnosis not present

## 2024-05-04 DIAGNOSIS — Z131 Encounter for screening for diabetes mellitus: Secondary | ICD-10-CM

## 2024-05-04 NOTE — Patient Instructions (Signed)
 Medication Instructions:   *If you need a refill on your cardiac medications before your next appointment, please call your pharmacy*  Lab Work: fasting cmet, vit d, hgba1c, insulin today  If you have labs (blood work) drawn today and your tests are completely normal, you will receive your results only by: MyChart Message (if you have MyChart) OR A paper copy in the mail If you have any lab test that is abnormal or we need to change your treatment, we will call you to review the results.  Testing/Procedures:    Follow-Up: September with DR Avanell Bob  At [facility], you and your health needs are our priority.  As part of our continuing mission to provide you with exceptional heart care, our providers are all part of one team.  This team includes your primary Cardiologist (physician) and Advanced Practice Providers or APPs (Physician Assistants and Nurse Practitioners) who all work together to provide you with the care you need, when you need it.  Your next appointment:  We recommend signing up for the patient portal called "MyChart".  Sign up information is provided on this After Visit Summary.  MyChart is used to connect with patients for Virtual Visits (Telemedicine).  Patients are able to view lab/test results, encounter notes, upcoming appointments, etc.  Non-urgent messages can be sent to your provider as well.   To learn more about what you can do with MyChart, go to [email or web address].   Other Instructions

## 2024-05-05 ENCOUNTER — Ambulatory Visit: Payer: Self-pay | Admitting: *Deleted

## 2024-05-13 ENCOUNTER — Ambulatory Visit: Payer: Self-pay

## 2024-05-13 NOTE — Telephone Encounter (Signed)
 Copied from CRM [id number]. Topic: Clinical - Red Word Triage >> May 13, 2024  9:20 AM Kita Perish H wrote: [facility] that prompted transfer to Nurse Triage: Pain in right hand  Chief Complaint: pain in right hand Symptoms: pain in hand caused by cyst Frequency: comes and goes Pertinent Negatives: Patient denies swelling, numbness Disposition: [] ED /[] Urgent Care (no appt availability in office) / [] Appointment(In office/virtual)/ []  [place] [facility]/ [] Home Care/ [] Refused Recommended Disposition /[]  Mobile Bus/ [x]  Follow-up with PCP Additional Notes: declined apt earlier than July.  Care advice given, denies questions; instructed to go to ER if becomes worse.   Reason for Disposition  [1] MODERATE pain (e.g., interferes with normal activities) AND [2] present > 3 days  Answer Assessment - Initial Assessment Questions 1. ONSET: "When did the pain start?"     Three or four months ago 2. LOCATION: "Where is the pain located?"     Right hand 3. PAIN: "How bad is the pain?" (Scale 1-10; or mild, moderate, severe)   - MILD (1-3): doesn't interfere with normal activities   - MODERATE (4-7): interferes with normal activities (e.g., work or school) or awakens from sleep   - SEVERE (8-10): excruciating pain, unable to use hand at all     mild 4. WORK OR EXERCISE: "Has there been any recent work or exercise that involved this part (i.e., hand or wrist) of the body?"     denies 5. CAUSE: "What do you think is causing the pain?"     cyst 6. AGGRAVATING FACTORS: "What makes the pain worse?" (e.g., using computer)     Using the hand 7. OTHER SYMPTOMS: "Do you have any other symptoms?" (e.g., neck pain, swelling, rash, numbness, fever)     no 8. PREGNANCY: "Is there any chance you are pregnant?" "When was your last menstrual period?"     na  Protocols used: Hand and Wrist Pain-A-AH

## 2024-05-13 NOTE — Telephone Encounter (Signed)
 Left detailed message on machine to call back  If same is as before with cyst on hand she can call Dr. Christiane Cowing at [facility] and get in sooner otherwise we can see her on 06/15/24

## 2024-05-14 ENCOUNTER — Telehealth: Payer: Self-pay | Admitting: Family Medicine

## 2024-05-14 NOTE — Telephone Encounter (Signed)
 Copied from CRM [id number]. Topic: General - Call Back - No Documentation >> May 14, 2024 10:22 AM Magdalene School wrote: Reason for CRM: Patient returning call she received this morning 05/14/24. I informed her of message from 05/13/24 encounter "If same is as before with cyst on hand she can call Dr. Christiane Cowing at [facility] and get in sooner otherwise we can see her on 06/15/24" she stated that she is aware of that but she received a call this morning and there was no message left for her.

## 2024-05-14 NOTE — Telephone Encounter (Signed)
 Called patient and no answer left a detailed message on her vm advising that the only call I see in her chart was from yesterday, 05/13/24.

## 2024-05-15 LAB — COMPREHENSIVE METABOLIC PANEL WITH GFR
ALT: 16 IU/L (ref 0–32)
AST: 17 IU/L (ref 0–40)
Albumin: 4.1 g/dL (ref 3.8–4.8)
Alkaline Phosphatase: 102 IU/L (ref 44–121)
BUN/Creatinine Ratio: 16 (ref 12–28)
BUN: 17 mg/dL (ref 8–27)
Bilirubin Total: 0.3 mg/dL (ref 0.0–1.2)
CO2: 23 mmol/L (ref 20–29)
Calcium: 8.3 mg/dL — ABNORMAL LOW (ref 8.7–10.3)
Chloride: 103 mmol/L (ref 96–106)
Creatinine, Ser: 1.06 mg/dL — ABNORMAL HIGH (ref 0.57–1.00)
Globulin, Total: 2.9 g/dL (ref 1.5–4.5)
Glucose: 93 mg/dL (ref 70–99)
Potassium: 4.4 mmol/L (ref 3.5–5.2)
Sodium: 143 mmol/L (ref 134–144)
Total Protein: 7 g/dL (ref 6.0–8.5)
eGFR: 54 mL/min/{1.73_m2} — ABNORMAL LOW (ref >59)

## 2024-05-15 LAB — INSULIN, FREE AND TOTAL
Free Insulin: 5.9 uU/mL
Total Insulin: 5.9 uU/mL

## 2024-05-15 LAB — HEMOGLOBIN A1C
Est. average glucose Bld gHb Est-mCnc: 120 mg/dL
Hgb A1c MFr Bld: 5.8 % — ABNORMAL HIGH (ref 4.8–5.6)

## 2024-05-17 ENCOUNTER — Other Ambulatory Visit: Payer: Self-pay | Admitting: Family Medicine

## 2024-05-17 ENCOUNTER — Other Ambulatory Visit: Payer: Self-pay | Admitting: Family

## 2024-05-17 ENCOUNTER — Other Ambulatory Visit: Payer: Self-pay | Admitting: Internal Medicine

## 2024-05-17 DIAGNOSIS — I10 Essential (primary) hypertension: Secondary | ICD-10-CM

## 2024-05-17 DIAGNOSIS — E876 Hypokalemia: Secondary | ICD-10-CM

## 2024-05-17 DIAGNOSIS — R079 Chest pain, unspecified: Secondary | ICD-10-CM

## 2024-05-17 DIAGNOSIS — E039 Hypothyroidism, unspecified: Secondary | ICD-10-CM

## 2024-05-17 DIAGNOSIS — I2510 Atherosclerotic heart disease of native coronary artery without angina pectoris: Secondary | ICD-10-CM

## 2024-05-20 ENCOUNTER — Ambulatory Visit: Payer: 59 | Admitting: Nurse Practitioner

## 2024-06-08 DIAGNOSIS — R209 Unspecified disturbances of skin sensation: Secondary | ICD-10-CM | POA: Diagnosis not present

## 2024-06-08 DIAGNOSIS — R2231 Localized swelling, mass and lump, right upper limb: Secondary | ICD-10-CM | POA: Diagnosis not present

## 2024-06-08 DIAGNOSIS — M1811 Unilateral primary osteoarthritis of first carpometacarpal joint, right hand: Secondary | ICD-10-CM | POA: Diagnosis not present

## 2024-06-09 ENCOUNTER — Other Ambulatory Visit (HOSPITAL_COMMUNITY): Payer: Self-pay | Admitting: Orthopedic Surgery

## 2024-06-09 DIAGNOSIS — R2231 Localized swelling, mass and lump, right upper limb: Secondary | ICD-10-CM

## 2024-06-09 NOTE — Addendum Note (Signed)
 Addended by: Dain Laseter K on: 06/09/2024 03:43 PM   Modules accepted: Orders

## 2024-06-09 NOTE — Telephone Encounter (Signed)
 Call pt to remind about follow up lipid lab. Will be going to lab tomorrow June 18.

## 2024-06-11 ENCOUNTER — Emergency Department (HOSPITAL_BASED_OUTPATIENT_CLINIC_OR_DEPARTMENT_OTHER)

## 2024-06-11 ENCOUNTER — Emergency Department (HOSPITAL_BASED_OUTPATIENT_CLINIC_OR_DEPARTMENT_OTHER)
Admission: EM | Admit: 2024-06-11 | Discharge: 2024-06-11 | Disposition: A | Discharge status: Home / Self Care | Attending: Emergency Medicine | Admitting: Emergency Medicine

## 2024-06-11 ENCOUNTER — Encounter (HOSPITAL_BASED_OUTPATIENT_CLINIC_OR_DEPARTMENT_OTHER): Payer: Self-pay

## 2024-06-11 ENCOUNTER — Other Ambulatory Visit: Payer: Self-pay

## 2024-06-11 DIAGNOSIS — R0789 Other chest pain: Secondary | ICD-10-CM | POA: Diagnosis not present

## 2024-06-11 DIAGNOSIS — E039 Hypothyroidism, unspecified: Secondary | ICD-10-CM | POA: Diagnosis not present

## 2024-06-11 DIAGNOSIS — Z7982 Long term (current) use of aspirin: Secondary | ICD-10-CM | POA: Insufficient documentation

## 2024-06-11 DIAGNOSIS — Z7901 Long term (current) use of anticoagulants: Secondary | ICD-10-CM | POA: Insufficient documentation

## 2024-06-11 DIAGNOSIS — I2510 Atherosclerotic heart disease of native coronary artery without angina pectoris: Secondary | ICD-10-CM | POA: Diagnosis not present

## 2024-06-11 DIAGNOSIS — R079 Chest pain, unspecified: Secondary | ICD-10-CM | POA: Diagnosis not present

## 2024-06-11 DIAGNOSIS — Z79899 Other long term (current) drug therapy: Secondary | ICD-10-CM | POA: Insufficient documentation

## 2024-06-11 DIAGNOSIS — I119 Hypertensive heart disease without heart failure: Secondary | ICD-10-CM | POA: Insufficient documentation

## 2024-06-11 DIAGNOSIS — Q677 Pectus carinatum: Secondary | ICD-10-CM | POA: Diagnosis not present

## 2024-06-11 DIAGNOSIS — I10 Essential (primary) hypertension: Secondary | ICD-10-CM | POA: Diagnosis not present

## 2024-06-11 LAB — CBC
HCT: 42 % (ref 36.0–46.0)
Hemoglobin: 13.9 g/dL (ref 12.0–15.0)
MCH: 28.7 pg (ref 26.0–34.0)
MCHC: 33.1 g/dL (ref 30.0–36.0)
MCV: 86.6 fL (ref 80.0–100.0)
Platelets: 346 10*3/uL (ref 150–400)
RBC: 4.85 MIL/uL (ref 3.87–5.11)
RDW: 14.1 % (ref 11.5–15.5)
WBC: 8.7 10*3/uL (ref 4.0–10.5)
nRBC: 0 % (ref 0.0–0.2)

## 2024-06-11 LAB — BASIC METABOLIC PANEL WITH GFR
Anion gap: 14 (ref 5–15)
BUN: 24 mg/dL — ABNORMAL HIGH (ref 8–23)
CO2: 25 mmol/L (ref 22–32)
Calcium: 8.6 mg/dL — ABNORMAL LOW (ref 8.9–10.3)
Chloride: 104 mmol/L (ref 98–111)
Creatinine, Ser: 1.06 mg/dL — ABNORMAL HIGH (ref 0.44–1.00)
GFR, Estimated: 54 mL/min — ABNORMAL LOW (ref >60)
Glucose, Bld: 129 mg/dL — ABNORMAL HIGH (ref 70–99)
Potassium: 4.3 mmol/L (ref 3.5–5.1)
Sodium: 142 mmol/L (ref 135–145)

## 2024-06-11 LAB — TROPONIN T, HIGH SENSITIVITY
Troponin T High Sensitivity: <15 ng/L (ref <19)
Troponin T High Sensitivity: <15 ng/L (ref <19)

## 2024-06-11 NOTE — Discharge Instructions (Addendum)
 While you were in the emergency room, you had blood work done to look for signs of injury to your heart.  These tests were normal.  You also had an EKG and a chest x-ray that were normal.  As far as your blood pressure, I recommend keeping a log of your blood pressure for the next 1 week.  You can discuss this with your primary care doctor.  Return to the emergency room for any new or worsening pain in your chest.

## 2024-06-11 NOTE — ED Provider Notes (Signed)
 [place] [facility] Provider Note  CSN: [id number] Arrival date & time: 06/11/24 1533  Chief Complaint(s) Chest Pain  HPI [name] is a 78 y.o. female who is here today for an episode of chest pain that occurred this morning.  Patient reports some heaviness in her chest, as well as a pinching sensation on the left side of her chest.  Prior history of an MI 10 years ago, had 2 stents placed, is on Plavix .  She has been compliant with her medications.   Past Medical History Past Medical History:  Diagnosis Date   Acute MI, subendocardial (HCC)    Arthritis    Back pain 05/31/2013   Benign paroxysmal positional vertigo 08/05/2016   CAD (coronary artery disease)    a. NSTEMI 10/2011: LHC 11/19/11: pLAD 30%, oOM 60%, AVCFX 30%, CFX after OM2 30%, pRCA 60 and 70%, then 99%, AM 80-90% with TIMI 3 flow.  PCI: Promus DES x 2 to RCA; b. 06/2012 Cath: patent RCA stents w/ subtl occl of Acute Marginal (jailed)->Med rx; c. 05/2015 MV: EF 59%, mod mid infsept/inf/ap lat/ap infarct with peri-infarct isch-->Med Rx; d. 02/2016 Cath: LM nl, LAD 76m, RI 50, RCA patent stents.   Colitis    Facial skin lesion 01/28/2017   GERD (gastroesophageal reflux disease)    occasional   Hyperlipidemia    Hypertension    Hypertensive heart disease    a. Echocardiogram 11/19/11: Difficult acoustic windows, EF 60-65%, normal LV wall thickness, grade 1 diastolic dysfunction   Hypoparathyroidism (HCC)    Low zinc  level 11/26/2016   Multinodular thyroid     Goiter s/p thyroidectomy in 2007 with post-op  hypocalcemia and post-op hypothyroidism/hypoparathyroidism with hypocalcemia   Neck pain on right side 07/13/2013   Nocturia 05/31/2013   Osteoarthritis    Pain in right axilla 03/13/2016   Palpitations    a. 03/2012 - patient set up for event monitor but did not wear correctly -  she declined wearing a repeat monitor   Post-surgical hypothyroidism    Sun-damaged skin 12/05/2014    Tubular adenoma of colon 06/2011   Unspecified constipation 05/31/2013   Vertigo    Zinc  deficiency 11/26/2015   Patient Active Problem List   Diagnosis Date Noted   Ganglion cyst 03/27/2024   Dysuria 03/27/2024   Loss of transverse plantar arch 02/05/2024   Bronchitis 01/07/2024   Onychomycosis 11/05/2023   CRI (chronic renal insufficiency) 07/16/2023   Arthritis of left midfoot 06/11/2023   Sinus tarsi syndrome of left ankle 04/03/2023   Sinus tarsi syndrome of right ankle 02/18/2023   Edema 02/17/2023   Acute prerenal azotemia 02/06/2023   TIA (transient ischemic attack) 02/05/2023   Paresthesia 08/28/2022   Chronic neck pain 08/28/2022   Neuropathy 08/21/2022   Vitamin deficiency 10/23/2021   Hypoglycemia 10/07/2019   Heart disease 01/29/2019   Sensorineural hearing loss (SNHL) of both ears 01/27/2019   Primary osteoarthritis of both knees 12/19/2018   DDD (degenerative disc disease), lumbar 12/19/2018   ANA positive 10/27/2018   Anxiety 02/17/2018   Peripheral arterial disease (HCC) 02/20/2017   Skin lesion of face 01/28/2017   Benign paroxysmal positional vertigo 08/05/2016   Antiplatelet or antithrombotic long-term use 03/27/2016   Right lumbar radiculopathy 02/21/2016   Abnormality of gait 02/21/2016   Zinc  deficiency 11/26/2015   Right hip pain 10/26/2015   Medicare annual wellness visit, subsequent 03/13/2015   Preventative health care 03/13/2015   Sun-damaged skin 12/05/2014   Angina  of effort (HCC) 12/02/2014   Hx of colonic polyps 09/28/2014   Benign neoplasm of descending colon 09/28/2014   Other fatigue 09/10/2014   History of colonic polyps 07/16/2014   Bilateral thumb pain 03/20/2014   Postsurgical hypoparathyroidism (HCC) 08/03/2013   Neck pain on right side 07/13/2013   Chronic constipation 05/31/2013   Back pain 05/31/2013   Nocturia 05/31/2013   GERD (gastroesophageal reflux disease) 11/02/2012   Tension headache 11/02/2012   Anemia 06/11/2012    Hypokalemia 06/11/2012   Depression 01/21/2012   Headache 12/23/2011   Sinus bradycardia 12/07/2011   Hypocalcemia 12/07/2011   Acute MI, subendocardial (HCC) 11/20/2011   Acquired hypothyroidism 05/20/2009   Essential hypertension, benign 05/20/2009   Hyperlipidemia 03/08/2009   CAD, NATIVE VESSEL 03/08/2009   Home Medication(s) Prior to Admission medications   Medication Sig Start Date End Date Taking? Authorizing Provider  albuterol  (VENTOLIN  HFA) 108 (90 Base) MCG/ACT inhaler Inhale 2 puffs into the lungs every 6 (six) hours as needed for wheezing or shortness of breath. 01/07/24   O'Sullivan, Melissa, NP  amLODipine  (NORVASC ) 10 MG tablet TAKE 1 TABLET BY MOUTH DAILY 05/20/24   Elmyra Haggard, MD  aspirin  EC 81 MG tablet Take 81 mg by mouth daily. 11/22/11   Dunn, Dayna N, PA-C  B Complex-C (B-COMPLEX WITH VITAMIN C) tablet Take 1 tablet by mouth daily.    [provider]  Bempedoic Acid  (NEXLETOL ) 180 MG TABS Take 1 tablet (180 mg total) by mouth daily. 12/19/23   Elmyra Haggard, MD  bisoprolol  (ZEBETA ) 5 MG tablet Take 0.5 tablets (2.5 mg total) by mouth daily. 11/12/23   Elmyra Haggard, MD  calcitRIOL  (ROCALTROL ) 0.25 MCG capsule TAKE 3 CAPSULES BY MOUTH DAILY 08/20/23   Elmyra Haggard, MD  calcium  carbonate (TUMS) 500 MG chewable tablet Chew 2 tablets (400 mg of elemental calcium  total) by mouth 2 (two) times daily. 01/15/24   O'Sullivan, Melissa, NP  Cholecalciferol  (VITAMIN D ) 125 MCG (5000 UT) CAPS Take 5,000 Units by mouth daily in the afternoon. 01/17/23   Elmyra Haggard, MD  ciclopirox  (PENLAC ) 8 % solution Apply topically at bedtime. Apply over nail and surrounding skin. Apply daily over previous coat. After seven (7) days, may remove with alcohol and continue cycle. 06/12/23   Everlina Hock, NP  clopidogrel  (PLAVIX ) 75 MG tablet TAKE 1 TABLET BY MOUTH DAILY 05/20/24   Ross, Paula V, MD  conjugated estrogens  (PREMARIN ) vaginal cream Apply small amount to vaginal mucosa twice  weekly 04/07/24   Neda Balk, MD  furosemide  (LASIX ) 20 MG tablet TAKE 1 TABLET BY MOUTH ONCE  WEEKLY AS NEEDED 05/19/24   Neda Balk, MD  hydrALAZINE  (APRESOLINE ) 10 MG tablet TAKE 1/2 TABLET (5 MG) BY MOUTH AS NEEDED FOR BP ABOVE 160 OR GREATER 04/02/23   Elmyra Haggard, MD  hydrocortisone  (ANUSOL -HC) 2.5 % rectal cream Place 1 Application rectally 2 (two) times daily. 11/14/23   Arlee Bellows, NP  isosorbide  mononitrate (IMDUR ) 60 MG 24 hr tablet TAKE 1 TABLET BY MOUTH DAILY 04/27/24   Elmyra Haggard, MD  levothyroxine  (SYNTHROID ) 112 MCG tablet Take 1 tablet (112 mcg total) by mouth 3 (three) times a week. 12/11/23   O'Sullivan, Melissa, NP  Multiple Vitamin (MULTIVITAMIN WITH MINERALS) TABS tablet Take 1 tablet by mouth daily.    [provider]  nitroGLYCERIN  (NITROSTAT ) 0.4 MG SL tablet DISSOLVE 1 TABLET UNDER THE  TONGUE EVERY 5 MINUTES AS NEEDED FOR CHEST  PAIN. MAX OF 3 TABLETS IN 15 MINUTES. CALL 911 IF PAIN  PERSISTS. 05/20/24   Elmyra Haggard, MD  pantoprazole  (PROTONIX ) 40 MG tablet TAKE 1 TABLET BY MOUTH DAILY 03/12/24   Neda Balk, MD  REPATHA  SURECLICK 140 MG/ML SOAJ INJECT 140MG  SUBCUTANEOUSLY  EVERY 2 WEEKS 01/20/24   Elmyra Haggard, MD  rosuvastatin  (CRESTOR ) 20 MG tablet Take 1 tablet (20 mg total) by mouth daily. 12/19/23 03/18/24  Elmyra Haggard, MD  urea  (CARMOL) 10 % cream Apply topically as needed. 12/27/22   Standiford, Karlene Overcast, DPM                                                                                                                                    Past Surgical History Past Surgical History:  Procedure Laterality Date   CARDIAC CATHETERIZATION N/A 03/08/2016   Procedure: Left Heart Cath and Coronary Angiography;  Surgeon: Lucendia Rusk, MD;  Location: [facility] INVASIVE CV LAB;  Service: Cardiovascular;  Laterality: N/A;   COLONOSCOPY  Aenomatous polyps   07/05/2011   COLONOSCOPY N/A 09/28/2014   Procedure: COLONOSCOPY;  Surgeon: Asencion Blacksmith, MD;  Location: [facility] ENDOSCOPY;  Service: Endoscopy;  Laterality: N/A;   CORONARY ANGIOPLASTY WITH STENT PLACEMENT     LEFT HEART CATH AND CORONARY ANGIOGRAPHY N/A 12/09/2018   Procedure: LEFT HEART CATH AND CORONARY ANGIOGRAPHY;  Surgeon: Lucendia Rusk, MD;  Location: [facility] INVASIVE CV LAB;  Service: Cardiovascular;  Laterality: N/A;   LEFT HEART CATHETERIZATION WITH CORONARY ANGIOGRAM N/A 07/17/2012   Procedure: LEFT HEART CATHETERIZATION WITH CORONARY ANGIOGRAM;  Surgeon: Kristopher Pheasant, MD;  Location: [facility] CATH LAB;  Service: Cardiovascular;  Laterality: N/A;   PERCUTANEOUS CORONARY STENT INTERVENTION (PCI-S) N/A 11/19/2011   Procedure: PERCUTANEOUS CORONARY STENT INTERVENTION (PCI-S);  Surgeon: Kristopher Pheasant, MD;  Location: [facility] CATH LAB;  Service: Cardiovascular;  Laterality: N/A;   POLYPECTOMY     SHOULDER ARTHROSCOPY W/ ROTATOR CUFF REPAIR     right   TOTAL THYROIDECTOMY  2007   GOITER   Family History Family History  Problem Relation Age of Onset   Colon cancer Brother    Cancer Brother        COLON   Hypertension Father    Heart disease Father    Heart attack Father    Blindness Sister    Congestive Heart Failure Sister    Hypertension Sister    Healthy Daughter    Healthy Son    Thyroid  disease Sister    Cancer Brother        multiple myelomas   Healthy Daughter    Healthy Daughter    Diabetes Neg Hx    Prostate cancer Neg Hx    Breast cancer Neg Hx    Esophageal cancer Neg Hx    Rectal cancer Neg Hx    Stomach cancer Neg Hx     Social History  Social History   Tobacco Use   Smoking status: Never   Smokeless tobacco: Never  Vaping Use   Vaping status: Never Used  Substance Use Topics   Alcohol use: Not Currently   Drug use: No   Allergies Zetia  [ezetimibe ] and Penicillin g  Review of Systems Review of Systems  Physical Exam Vital Signs  I have reviewed the triage vital signs BP (!) 145/52   Pulse (!) 52   Temp 97.8 F (36.6 C)  (Temporal)   Resp 17   Ht 5' 5 (1.651 m)   Wt 86.2 kg   SpO2 96%   BMI 31.62 kg/m   Physical Exam Vitals and nursing note reviewed.   Cardiovascular:     Rate and Rhythm: Normal rate.     Heart sounds: Normal heart sounds.  Pulmonary:     Effort: Pulmonary effort is normal.     Breath sounds: Normal breath sounds. No wheezing.  Chest:     Chest wall: No mass.  Abdominal:     Palpations: Abdomen is soft.   Musculoskeletal:        General: Normal range of motion.   Neurological:     Mental Status: She is alert.     ED Results and Treatments Labs (all labs ordered are listed, but only abnormal results are displayed) Labs Reviewed  BASIC METABOLIC PANEL WITH GFR - Abnormal; Notable for the following components:      Result Value   Glucose, Bld 129 (*)    BUN 24 (*)    Creatinine, Ser 1.06 (*)    Calcium  8.6 (*)    GFR, Estimated 54 (*)    All other components within normal limits  CBC  TROPONIN T, HIGH SENSITIVITY  TROPONIN T, HIGH SENSITIVITY                                                                                                                          Radiology DG Chest 2 View Result Date: 06/11/2024 CLINICAL DATA:  Chest pain/heaviness that began 1 hour ago. Hypertension. EXAM: CHEST - 2 VIEW COMPARISON:  10/22/2023 FINDINGS: Normal sized heart. Stable minimal linear scarring at the right lateral costophrenic angle. Interval minimal linear atelectasis or scarring at the left lateral lung base. Otherwise, clear lungs with normal vascularity. No pneumothorax or pleural fluid seen. Thoracolumbar spine degenerative changes and mild scoliosis. Pectus carinatum. IMPRESSION: No acute abnormality. Electronically Signed   By: Catherin Closs M.D.   On: 06/11/2024 16:22    Pertinent labs & imaging results that were available during my care of the patient were reviewed by me and considered in my medical decision making (see MDM for details).  Medications Ordered in  ED Medications - No data to display  Procedures Procedures  (including critical care time)  Medical Decision Making / ED Course   This patient presents to the ED for concern of chest pain and heaviness, this involves an extensive number of treatment options, and is a complaint that carries with it a high risk of complications and morbidity.  The differential diagnosis includes ACS, angina, musculoskeletal chest pain, less likely dissection, less likely PE.  MDM: On exam, patient overall well-appearing.  Her EKG is nonischemic.  She is not currently experiencing chest pain.  Initial troponin negative.  My dependent review the patient's chest x-ray shows no pneumonia.  Will check a second troponin on the patient.  Lower suspicion for dissection given her absence of symptoms presently.  Well score of 0.  Reassessment 7:20 PM-delta troponin negative.  Will discharge.   Additional history obtained: -Additional history obtained from  -External records from outside source obtained and reviewed including: Chart review including previous notes, labs, imaging, consultation notes   Lab Tests: -I ordered, reviewed, and interpreted labs.   The pertinent results include:   Labs Reviewed  BASIC METABOLIC PANEL WITH GFR - Abnormal; Notable for the following components:      Result Value   Glucose, Bld 129 (*)    BUN 24 (*)    Creatinine, Ser 1.06 (*)    Calcium  8.6 (*)    GFR, Estimated 54 (*)    All other components within normal limits  CBC  TROPONIN T, HIGH SENSITIVITY  TROPONIN T, HIGH SENSITIVITY      EKG   EKG Interpretation Date/Time:  Thursday June 11 2024 15:43:06 EDT Ventricular Rate:  56 PR Interval:  194 QRS Duration:  148 QT Interval:  476 QTC Calculation: 460 R Axis:   -27  Text Interpretation: Sinus rhythm Right bundle branch block  Confirmed by Afton Horse [phone]) on 06/11/2024 5:58:22 PM         Imaging Studies ordered: I ordered imaging studies including chest x-ray I independently visualized and interpreted imaging. I agree with the radiologist interpretation   Medicines ordered and prescription drug management: No orders of the defined types were placed in this encounter.   -I have reviewed the patients home medicines and have made adjustments as needed   Cardiac Monitoring: The patient was maintained on a cardiac monitor.  I personally viewed and interpreted the cardiac monitored which showed an underlying rhythm of: Sinus rhythm  Social Determinants of Health:  Factors impacting patients care include: Lack of access to primary care   Reevaluation: After the interventions noted above, I reevaluated the patient and found that they have :improved  Co morbidities that complicate the patient evaluation  Past Medical History:  Diagnosis Date   Acute MI, subendocardial (HCC)    Arthritis    Back pain 05/31/2013   Benign paroxysmal positional vertigo 08/05/2016   CAD (coronary artery disease)    a. NSTEMI 10/2011: LHC 11/19/11: pLAD 30%, oOM 60%, AVCFX 30%, CFX after OM2 30%, pRCA 60 and 70%, then 99%, AM 80-90% with TIMI 3 flow.  PCI: Promus DES x 2 to RCA; b. 06/2012 Cath: patent RCA stents w/ subtl occl of Acute Marginal (jailed)->Med rx; c. 05/2015 MV: EF 59%, mod mid infsept/inf/ap lat/ap infarct with peri-infarct isch-->Med Rx; d. 02/2016 Cath: LM nl, LAD 70m, RI 50, RCA patent stents.   Colitis    Facial skin lesion 01/28/2017   GERD (gastroesophageal reflux disease)    occasional   Hyperlipidemia    Hypertension    Hypertensive  heart disease    a. Echocardiogram 11/19/11: Difficult acoustic windows, EF 60-65%, normal LV wall thickness, grade 1 diastolic dysfunction   Hypoparathyroidism (HCC)    Low zinc  level 11/26/2016   Multinodular thyroid     Goiter s/p thyroidectomy in 2007 with post-op   hypocalcemia and post-op hypothyroidism/hypoparathyroidism with hypocalcemia   Neck pain on right side 07/13/2013   Nocturia 05/31/2013   Osteoarthritis    Pain in right axilla 03/13/2016   Palpitations    a. 03/2012 - patient set up for event monitor but did not wear correctly -  she declined wearing a repeat monitor   Post-surgical hypothyroidism    Sun-damaged skin 12/05/2014   Tubular adenoma of colon 06/2011   Unspecified constipation 05/31/2013   Vertigo    Zinc  deficiency 11/26/2015      Dispostion: I considered admission for this patient, however with her reassuring workup she is appropriate for discharge.     Final Clinical Impression(s) / ED Diagnoses Final diagnoses:  Chest pain, unspecified type     @PCDICTATION @    Afton Horse T, DO 06/11/24 1925

## 2024-06-11 NOTE — ED Triage Notes (Signed)
 Patient here POV from Home.  Noted Chest Heaviness that began 1 hour ago. No Other associated symptoms such as N/V or SOB but did note HTN at 175 Systolic. MI 10 years ago.  NAD Noted during Triage. A&Ox4. GCS 15. Ambulatory.

## 2024-06-14 NOTE — Assessment & Plan Note (Signed)
 On Levothyroxine, continue to monitor

## 2024-06-14 NOTE — Assessment & Plan Note (Signed)
 Hydrate and monitor

## 2024-06-14 NOTE — Assessment & Plan Note (Signed)
 Monitor and report any concerns, no changes to meds. Encouraged heart healthy diet such as the DASH diet and exercise as tolerated.  ?

## 2024-06-14 NOTE — Assessment & Plan Note (Signed)
 Encourage heart healthy diet such as MIND or DASH diet, increase exercise, avoid trans fats, simple carbohydrates and processed foods, consider a krill or fish or flaxseed oil cap daily. Tolerating Rosuvastatin

## 2024-06-14 NOTE — Assessment & Plan Note (Signed)
 Supplement and monitor

## 2024-06-15 ENCOUNTER — Ambulatory Visit (INDEPENDENT_AMBULATORY_CARE_PROVIDER_SITE_OTHER): Payer: 59 | Admitting: Family Medicine

## 2024-06-15 ENCOUNTER — Encounter: Payer: Self-pay | Admitting: Family Medicine

## 2024-06-15 ENCOUNTER — Ambulatory Visit: Payer: Self-pay | Admitting: Family Medicine

## 2024-06-15 VITALS — BP 134/72 | HR 50 | Resp 16 | Ht 65.0 in | Wt 190.0 lb

## 2024-06-15 DIAGNOSIS — N189 Chronic kidney disease, unspecified: Secondary | ICD-10-CM

## 2024-06-15 DIAGNOSIS — E782 Mixed hyperlipidemia: Secondary | ICD-10-CM | POA: Diagnosis not present

## 2024-06-15 DIAGNOSIS — E60 Dietary zinc deficiency: Secondary | ICD-10-CM

## 2024-06-15 DIAGNOSIS — I10 Essential (primary) hypertension: Secondary | ICD-10-CM

## 2024-06-15 DIAGNOSIS — E039 Hypothyroidism, unspecified: Secondary | ICD-10-CM | POA: Diagnosis not present

## 2024-06-15 LAB — LIPID PANEL
Cholesterol: 158 mg/dL (ref 0–200)
HDL: 44 mg/dL (ref >39.00)
LDL Cholesterol: 84 mg/dL (ref 0–99)
NonHDL: 114.46
Total CHOL/HDL Ratio: 4
Triglycerides: 152 mg/dL — ABNORMAL HIGH (ref 0.0–149.0)
VLDL: 30.4 mg/dL (ref 0.0–40.0)

## 2024-06-15 LAB — TSH: TSH: 1.06 u[IU]/mL (ref 0.35–5.50)

## 2024-06-15 NOTE — Progress Notes (Signed)
 Subjective:    Patient ID: [name], female    DOB: 02/23/1946, 78 y.o.   MRN: [id number]  Chief Complaint  Patient presents with   Medical Management of Chronic Issues    Patient presents today for a 6 month follow-up.   Quality Metric Gaps    Pap, AWV, TDAP, zoster    HPI Discussed the use of AI scribe software for clinical note transcription with the patient, who gave verbal consent to proceed.  History of Present Illness [name] is a 78 year old female with hypertension who presents with elevated blood pressure and chest pressure.  On Friday night, she experienced elevated blood pressure of 180 mmHg while watching TV, accompanied by a tight sensation in her chest. She did not have sweating, nausea, or shortness of breath. She went to the emergency room where her blood pressure decreased and the chest pressure subsided. Since returning home, she has not experienced any further issues. Work up in ER was reviewed and negative for any cardiac cause  She has a history of renal insufficiency and is concerned about her creatinine levels. She does not take any over-the-counter supplements without checking first.  She reports swelling, itching, and burning sensations in her hand, which sometimes make it difficult to touch the affected area. The symptoms are localized, with occasional radiation upwards from the hand. She is scheduled for an ultrasound to further investigate these symptoms.    Past Medical History:  Diagnosis Date   Acute MI, subendocardial (HCC)    Arthritis    Back pain 05/31/2013   Benign paroxysmal positional vertigo 08/05/2016   CAD (coronary artery disease)    a. NSTEMI 10/2011: LHC 11/19/11: pLAD 30%, oOM 60%, AVCFX 30%, CFX after OM2 30%, pRCA 60 and 70%, then 99%, AM 80-90% with TIMI 3 flow.  PCI: Promus DES x 2 to RCA; b. 06/2012 Cath: patent RCA stents w/ subtl occl of Acute Marginal (jailed)->Med rx; c. 05/2015 MV: EF 59%, mod mid infsept/inf/ap  lat/ap infarct with peri-infarct isch-->Med Rx; d. 02/2016 Cath: LM nl, LAD 53m, RI 50, RCA patent stents.   Colitis    Facial skin lesion 01/28/2017   GERD (gastroesophageal reflux disease)    occasional   Hyperlipidemia    Hypertension    Hypertensive heart disease    a. Echocardiogram 11/19/11: Difficult acoustic windows, EF 60-65%, normal LV wall thickness, grade 1 diastolic dysfunction   Hypoparathyroidism (HCC)    Low zinc  level 11/26/2016   Multinodular thyroid     Goiter s/p thyroidectomy in 2007 with post-op  hypocalcemia and post-op hypothyroidism/hypoparathyroidism with hypocalcemia   Neck pain on right side 07/13/2013   Nocturia 05/31/2013   Osteoarthritis    Pain in right axilla 03/13/2016   Palpitations    a. 03/2012 - patient set up for event monitor but did not wear correctly -  she declined wearing a repeat monitor   Post-surgical hypothyroidism    Sun-damaged skin 12/05/2014   Tubular adenoma of colon 06/2011   Unspecified constipation 05/31/2013   Vertigo    Zinc  deficiency 11/26/2015    Past Surgical History:  Procedure Laterality Date   CARDIAC CATHETERIZATION N/A 03/08/2016   Procedure: Left Heart Cath and Coronary Angiography;  Surgeon: Candyce GORMAN Reek, MD;  Location[facility] INVASIVE CV LAB;  Service: Cardiovascular;  Laterality: N/A;   COLONOSCOPY  Aenomatous polyps   07/05/2011   COLONOSCOPY N/A 09/28/2014   Procedure: COLONOSCOPY;  Surgeon: Gwendlyn ONEIDA Buddy, MD;  Location: [facility];  Service: Endoscopy;  Laterality: N/A;   CORONARY ANGIOPLASTY WITH STENT PLACEMENT     LEFT HEART CATH AND CORONARY ANGIOGRAPHY N/A 12/09/2018   Procedure: LEFT HEART CATH AND CORONARY ANGIOGRAPHY;  Surgeon: Dann Candyce RAMAN, MD;  Location: [facility] INVASIVE CV LAB;  Service: Cardiovascular;  Laterality: N/A;   LEFT HEART CATHETERIZATION WITH CORONARY ANGIOGRAM N/A 07/17/2012   Procedure: LEFT HEART CATHETERIZATION WITH CORONARY ANGIOGRAM;  Surgeon: Debby JONETTA Como, MD;  Location: [facility] CATH  LAB;  Service: Cardiovascular;  Laterality: N/A;   PERCUTANEOUS CORONARY STENT INTERVENTION (PCI-S) N/A 11/19/2011   Procedure: PERCUTANEOUS CORONARY STENT INTERVENTION (PCI-S);  Surgeon: Debby JONETTA Como, MD;  Location: [facility] CATH LAB;  Service: Cardiovascular;  Laterality: N/A;   POLYPECTOMY     SHOULDER ARTHROSCOPY W/ ROTATOR CUFF REPAIR     right   TOTAL THYROIDECTOMY  2007   GOITER    Family History  Problem Relation Age of Onset   Colon cancer Brother    Cancer Brother        COLON   Hypertension Father    Heart disease Father    Heart attack Father    Blindness Sister    Congestive Heart Failure Sister    Hypertension Sister    Healthy Daughter    Healthy Son    Thyroid  disease Sister    Cancer Brother        multiple myelomas   Healthy Daughter    Healthy Daughter    Diabetes Neg Hx    Prostate cancer Neg Hx    Breast cancer Neg Hx    Esophageal cancer Neg Hx    Rectal cancer Neg Hx    Stomach cancer Neg Hx     Social History   Socioeconomic History   Marital status: Married    Spouse name: Not on file   Number of children: 4   Years of education: 14   Highest education level: Not on file  Occupational History   Occupation: Sales person at [employer]  Tobacco Use   Smoking status: Never   Smokeless tobacco: Never  Vaping Use   Vaping status: Never Used  Substance and Sexual Activity   Alcohol use: Not Currently   Drug use: No   Sexual activity: Not Currently  Other Topics Concern   Not on file  Social History Narrative   Lives at home alone.  Her son lives near her.   Right-handed.   1 cup coffee per day.   Social Drivers of Corporate investment banker Strain: Low Risk  (05/07/2022)   Overall Financial Resource Strain (CARDIA)    Difficulty of Paying Living Expenses: Not hard at all  Food Insecurity: No Food Insecurity (06/05/2023)   Hunger Vital Sign    Worried About Running Out of Food in the Last Year: Never true    Ran Out of Food in the Last  Year: Never true  Transportation Needs: No Transportation Needs (06/05/2023)   PRAPARE - Administrator, Civil Service (Medical): No    Lack of Transportation (Non-Medical): No  Physical Activity: Sufficiently Active (05/07/2022)   Exercise Vital Sign    Days of Exercise per Week: 7 days    Minutes of Exercise per Session: 30 min  Stress: No Stress Concern Present (05/07/2022)   [employer] of Occupational Health - Occupational Stress Questionnaire    Feeling of Stress : Not at all  Social Connections: Unknown (08/22/2022)   Received from [facility]  Social Network    Social Network: Not on file  Intimate Partner Violence: Not At Risk (02/06/2023)   Humiliation, Afraid, Rape, and Kick questionnaire    Fear of Current or Ex-Partner: No    Emotionally Abused: No    Physically Abused: No    Sexually Abused: No    Outpatient Medications Prior to Visit  Medication Sig Dispense Refill   albuterol  (VENTOLIN  HFA) 108 (90 Base) MCG/ACT inhaler Inhale 2 puffs into the lungs every 6 (six) hours as needed for wheezing or shortness of breath. 8 g 0   amLODipine  (NORVASC ) 10 MG tablet TAKE 1 TABLET BY MOUTH DAILY 90 tablet 3   aspirin  EC 81 MG tablet Take 81 mg by mouth daily.     B Complex-C (B-COMPLEX WITH VITAMIN C) tablet Take 1 tablet by mouth daily.     Bempedoic Acid  (NEXLETOL ) 180 MG TABS Take 1 tablet (180 mg total) by mouth daily. 30 tablet 11   bisoprolol  (ZEBETA ) 5 MG tablet Take 0.5 tablets (2.5 mg total) by mouth daily. 45 tablet 1   calcitRIOL  (ROCALTROL ) 0.25 MCG capsule TAKE 3 CAPSULES BY MOUTH DAILY 300 capsule 3   calcium  carbonate (TUMS) 500 MG chewable tablet Chew 2 tablets (400 mg of elemental calcium  total) by mouth 2 (two) times daily.     Cholecalciferol  (VITAMIN D ) 125 MCG (5000 UT) CAPS Take 5,000 Units by mouth daily in the afternoon. 100 capsule 3   ciclopirox  (PENLAC ) 8 % solution Apply topically at bedtime. Apply over nail and surrounding skin.  Apply daily over previous coat. After seven (7) days, may remove with alcohol and continue cycle. 6.6 mL 0   clopidogrel  (PLAVIX ) 75 MG tablet TAKE 1 TABLET BY MOUTH DAILY 90 tablet 3   conjugated estrogens  (PREMARIN ) vaginal cream Apply small amount to vaginal mucosa twice weekly 42.5 g 1   furosemide  (LASIX ) 20 MG tablet TAKE 1 TABLET BY MOUTH ONCE  WEEKLY AS NEEDED 12 tablet 3   hydrALAZINE  (APRESOLINE ) 10 MG tablet TAKE 1/2 TABLET (5 MG) BY MOUTH AS NEEDED FOR BP ABOVE 160 OR GREATER 30 tablet 6   hydrocortisone  (ANUSOL -HC) 2.5 % rectal cream Place 1 Application rectally 2 (two) times daily. 30 g 1   isosorbide  mononitrate (IMDUR ) 60 MG 24 hr tablet TAKE 1 TABLET BY MOUTH DAILY 90 tablet 2   levothyroxine  (SYNTHROID ) 112 MCG tablet Take 1 tablet (112 mcg total) by mouth 3 (three) times a week. 90 tablet 1   Multiple Vitamin (MULTIVITAMIN WITH MINERALS) TABS tablet Take 1 tablet by mouth daily.     nitroGLYCERIN  (NITROSTAT ) 0.4 MG SL tablet DISSOLVE 1 TABLET UNDER THE  TONGUE EVERY 5 MINUTES AS NEEDED FOR CHEST PAIN. MAX OF 3 TABLETS IN 15 MINUTES. CALL 911 IF PAIN  PERSISTS. 75 tablet 3   pantoprazole  (PROTONIX ) 40 MG tablet TAKE 1 TABLET BY MOUTH DAILY 100 tablet 2   REPATHA  SURECLICK 140 MG/ML SOAJ INJECT 140MG  SUBCUTANEOUSLY  EVERY 2 WEEKS 6 mL 3   rosuvastatin  (CRESTOR ) 20 MG tablet Take 1 tablet (20 mg total) by mouth daily. 90 tablet 3   urea  (CARMOL) 10 % cream Apply topically as needed. 71 g 0   No facility-administered medications prior to visit.    Allergies  Allergen Reactions   Zetia  [Ezetimibe ] Other (See Comments)    Myalgia, paresthesias and weakness   Penicillin G Rash    Review of Systems  Constitutional:  Positive for malaise/fatigue. Negative for fever.  HENT:  Negative  for congestion.   Eyes:  Negative for blurred vision.  Respiratory:  Negative for shortness of breath.   Cardiovascular:  Negative for chest pain, palpitations and leg swelling.  Gastrointestinal:   Negative for abdominal pain, blood in stool and nausea.  Genitourinary:  Negative for dysuria and frequency.  Musculoskeletal:  Positive for joint pain and myalgias. Negative for falls.  Skin:  Positive for itching. Negative for rash.  Neurological:  Negative for dizziness, loss of consciousness and headaches.  Endo/Heme/Allergies:  Negative for environmental allergies.  Psychiatric/Behavioral:  Negative for depression. The patient is not nervous/anxious.        Objective:    Physical Exam Constitutional:      General: She is not in acute distress.    Appearance: Normal appearance. She is well-developed. She is not toxic-appearing.  HENT:     Head: Normocephalic and atraumatic.     Right Ear: External ear normal.     Left Ear: External ear normal.     Nose: Nose normal.   Eyes:     General:        Right eye: No discharge.        Left eye: No discharge.     Conjunctiva/sclera: Conjunctivae normal.   Neck:     Thyroid : No thyromegaly.   Cardiovascular:     Rate and Rhythm: Normal rate and regular rhythm.     Heart sounds: Normal heart sounds. No murmur heard. Pulmonary:     Effort: Pulmonary effort is normal. No respiratory distress.     Breath sounds: Normal breath sounds.  Abdominal:     General: Bowel sounds are normal.     Palpations: Abdomen is soft.     Tenderness: There is no abdominal tenderness. There is no guarding.   Musculoskeletal:        General: Normal range of motion.     Cervical back: Neck supple.  Lymphadenopathy:     Cervical: No cervical adenopathy.   Skin:    General: Skin is warm and dry.   Neurological:     Mental Status: She is alert and oriented to person, place, and time.   Psychiatric:        Mood and Affect: Mood normal.        Behavior: Behavior normal.        Thought Content: Thought content normal.        Judgment: Judgment normal.     BP 134/72   Pulse (!) 50   Resp 16   Ht 5' 5 (1.651 m)   Wt 190 lb (86.2 kg)   SpO2  96%   BMI 31.62 kg/m  Wt Readings from Last 3 Encounters:  06/15/24 190 lb (86.2 kg)  06/11/24 190 lb (86.2 kg)  05/04/24 194 lb 12.8 oz (88.4 kg)    Diabetic Foot Exam - Simple   No data filed    Lab Results  Component Value Date   WBC 8.7 06/11/2024   HGB 13.9 06/11/2024   HCT 42.0 06/11/2024   PLT 346 06/11/2024   GLUCOSE 129 (H) 06/11/2024   CHOL 137 01/28/2024   TRIG 181.0 (H) 01/28/2024   HDL 50.20 01/28/2024   LDLDIRECT 96.0 11/06/2022   LDLCALC 51 01/28/2024   ALT 16 05/04/2024   AST 17 05/04/2024   NA 142 06/11/2024   K 4.3 06/11/2024   CL 104 06/11/2024   CREATININE 1.06 (H) 06/11/2024   BUN 24 (H) 06/11/2024   CO2 25 06/11/2024   TSH  2.44 01/28/2024   INR 0.9 02/05/2023   HGBA1C 5.8 (H) 05/04/2024    Lab Results  Component Value Date   TSH 2.44 01/28/2024   Lab Results  Component Value Date   WBC 8.7 06/11/2024   HGB 13.9 06/11/2024   HCT 42.0 06/11/2024   MCV 86.6 06/11/2024   PLT 346 06/11/2024   Lab Results  Component Value Date   NA 142 06/11/2024   K 4.3 06/11/2024   CO2 25 06/11/2024   GLUCOSE 129 (H) 06/11/2024   BUN 24 (H) 06/11/2024   CREATININE 1.06 (H) 06/11/2024   BILITOT 0.3 05/04/2024   ALKPHOS 102 05/04/2024   AST 17 05/04/2024   ALT 16 05/04/2024   PROT 7.0 05/04/2024   ALBUMIN 4.1 05/04/2024   CALCIUM  8.6 (L) 06/11/2024   ANIONGAP 14 06/11/2024   EGFR 54 (L) 05/04/2024   GFR 50.28 (L) 02/26/2024   Lab Results  Component Value Date   CHOL 137 01/28/2024   Lab Results  Component Value Date   HDL 50.20 01/28/2024   Lab Results  Component Value Date   LDLCALC 51 01/28/2024   Lab Results  Component Value Date   TRIG 181.0 (H) 01/28/2024   Lab Results  Component Value Date   CHOLHDL 3 01/28/2024   Lab Results  Component Value Date   HGBA1C 5.8 (H) 05/04/2024       Assessment & Plan:  Acquired hypothyroidism Assessment & Plan: On Levothyroxine , continue to monitor    Essential hypertension,  benign Assessment & Plan: Monitor and report any concerns, no changes to meds. Encouraged heart healthy diet such as the DASH diet and exercise as tolerated.    Orders: -     TSH  Mixed hyperlipidemia Assessment & Plan: Encourage heart healthy diet such as MIND or DASH diet, increase exercise, avoid trans fats, simple carbohydrates and processed foods, consider a krill or fish or flaxseed oil cap daily.  Tolerating Rosuvastatin   Orders: -     Lipid panel  Zinc  deficiency Assessment & Plan: Supplement and monitor   Orders: -     Zinc   Chronic renal impairment, unspecified CKD stage Assessment & Plan: Hydrate and monitor   Orders: -     COMPLETE METABOLIC PANEL WITHOUT GFR    Assessment and Plan Assessment & Plan Hypertension Recent elevated blood pressure episode.  Chest Pain Intermittent chest pressure likely related to hypertension and stress. She will notify us  if pain returns, she can f/u with cardiology as well  Chronic Kidney Disease (CKD) Chronic renal impairment with elevated creatinine levels. Emphasized hydration and caution with supplements. - Order metabolic panel to monitor kidney function. - Encourage fluid intake of 80 ounces daily, with 5-10 ounces every hour. - Avoid new supplements without consultation.  Ganglion Cyst Swelling, itching, and burning in the hand, possibly pinching a nerve. Dissatisfaction with current specialist. - Proceed with scheduled ultrasound. - Consider referral to sports medicine for a second opinion if needed.  Zinc  Deficiency Previous low zinc  levels. Zinc  is important for immune function. - Order zinc  level test. - Evaluate zinc  levels to determine if supplementation can be discontinued.  Protein Intake Discussed the importance of protein intake to maintain muscle mass and strength. - Encourage regular protein intake every 3-4 hours.     Harlene Horton, MD

## 2024-06-15 NOTE — Patient Instructions (Signed)
 Total fluids 80 ounces, 5 ounces every hour or at least 10 ounces every 2 hours  Protein intake roughly every 3-4 hours  Chronic Kidney Disease in Adults: What to Know Chronic kidney disease (CKD) is when lasting damage happens to the kidneys slowly over time. The kidneys are two organs that do many important things in the body. These include: Taking waste and extra fluid out of the blood to make pee (urine). Making hormones. Keeping the right amount of fluids and chemicals in the body. A small amount of kidney damage may not cause problems. You must take steps to help keep the kidney damage from getting worse. A lot of damage may cause kidney failure. Kidney failure means the kidneys can no longer work right. What are the causes? Diabetes. High blood pressure. Diseases that affect the heart and blood vessels. Other kidney diseases. Diseases that affect the body's defense system (immune system). A problem with the flow of pee. This may be caused by: Kidney stones. Cancer. An enlarged prostate, in males. A kidney infection or urinary tract infection (UTI) that keeps coming back. What increases the risk? Getting older. The chances of having CKD increase with age. A family history of kidney disease or kidney failure. Having a disease caused by genes. Taking medicines that can harm the kidneys. Being near or having contact with harmful substances. Being very overweight. Using tobacco now or in the past. What are the signs or symptoms? Common symptoms of CKD include: Feeling very tired and having less energy. Swelling of the face, legs, ankles, or feet. Throwing up or feeling like you may throw up. Not wanting to eat as much as normal. Being confused or not able to focus. Twitches and cramps in the leg muscles or other muscles. Dry, itchy skin. Other symptoms may include: Shortness of breath. Trouble sleeping. Making less pee, or making more pee, especially at night. A taste of  metal in your mouth. You may also become anemic. Anemia means there's not enough red blood cells in your blood. You may get symptoms slowly. You may not notice them until the kidney damage gets very bad. How is this diagnosed? CKD may be diagnosed based on: Tests on your blood or pee. Imaging tests, like an ultrasound or a CT scan. A kidney biopsy. For this test, a sample of kidney tissue is removed to be looked at under a microscope. These tests will help to find out how serious the CKD is. How is this treated? Often, there's no cure for CKD. Treatment can help with symptoms and help keep the disease from getting worse. Treatment may include: Treating other problems that are causing your CKD or making it worse. Diet changes. You may need to: Avoid alcohol. Avoid foods that are high in salt, potassium, phosphorous, and protein. Taking medicines for symptoms and to help control other conditions. Dialysis. This treatment gets harmful waste out of your body. It may be needed if you have kidney failure. Follow these instructions at home: Medicines Take your medicines only as told. The amount of some medicines you take may need to be changed. Do not take any new medicines, vitamins, or supplements unless your health care provider says it's okay. These may make kidney damage worse. Lifestyle Do not smoke, vape, or use nicotine or tobacco. If you drink alcohol: Limit how much you have to: 0-1 drink a day if you're female. 0-2 drinks a day if you're female. Know how much alcohol is in your drink. In the  [place] one drink is one 12 oz bottle of beer (355 mL), one 5 oz glass of wine (148 mL), or one 1 oz glass of hard liquor (44 mL). Stay at a healthy weight. If you need help, ask your provider. General instructions  Eat and drink as told. Track your blood pressure at home. Tell your provider about any changes. If you have diabetes, track your blood sugar as told. Exercise at least 30 minutes a  day, 5 days a week. Keep your shots (vaccinations) up to date. Keep all follow-up visits. Your provider may need to change your treatments over time. Where to find support American Kidney Fund: [place].com.au Kidney School: kidneyschool.org American Association of Kidney Patients: https://www.miller-montoya.com/ Where to find more information National Kidney Foundation: kidney.org Centers for Disease Control and Prevention. To learn more: Go to DiningCalendar.de. Click Search. Type chronic kidney disease in the search box. Contact a health care provider if: You have new symptoms. You get symptoms of end-stage kidney disease. These include: Headaches. Numbness in your hands or feet. Leg cramps. Easy bruising. Get help right away if: You have a fever. You make less pee than usual. You have pain or bleeding when you pee or poop. You have chest pain. You have shortness of breath. These symptoms may be an emergency. Call 911 right away. Do not wait to see if the symptoms will go away. Do not drive yourself to the hospital. This information is not intended to replace advice given to you by your health care provider. Make sure you discuss any questions you have with your health care provider. Document Revised: 10/22/2023 Document Reviewed: 06/14/2023 Elsevier Patient Education  2024 [employer].

## 2024-06-16 LAB — COMPLETE METABOLIC PANEL WITHOUT GFR
AG Ratio: 1.4 (calc) (ref 1.0–2.5)
ALT: 13 U/L (ref 6–29)
AST: 16 U/L (ref 10–35)
Albumin: 4.3 g/dL (ref 3.6–5.1)
Alkaline phosphatase (APISO): 80 U/L (ref 37–153)
BUN/Creatinine Ratio: 14 (calc) (ref 6–22)
BUN: 15 mg/dL (ref 7–25)
CO2: 28 mmol/L (ref 20–32)
Calcium: 9 mg/dL (ref 8.6–10.4)
Chloride: 101 mmol/L (ref 98–110)
Creat: 1.08 mg/dL — ABNORMAL HIGH (ref 0.60–1.00)
Globulin: 3.1 g/dL (ref 1.9–3.7)
Glucose, Bld: 95 mg/dL (ref 65–99)
Potassium: 4.2 mmol/L (ref 3.5–5.3)
Sodium: 141 mmol/L (ref 135–146)
Total Bilirubin: 0.5 mg/dL (ref 0.2–1.2)
Total Protein: 7.4 g/dL (ref 6.1–8.1)

## 2024-06-16 NOTE — Progress Notes (Signed)
Patient reviewed via MyChart.

## 2024-06-17 ENCOUNTER — Ambulatory Visit (HOSPITAL_COMMUNITY)

## 2024-06-18 LAB — ZINC: Zinc: 79 ug/dL (ref 60–130)

## 2024-06-22 ENCOUNTER — Encounter: Payer: Self-pay | Admitting: Family Medicine

## 2024-06-22 ENCOUNTER — Ambulatory Visit (INDEPENDENT_AMBULATORY_CARE_PROVIDER_SITE_OTHER): Admitting: Family Medicine

## 2024-06-22 VITALS — BP 130/72 | HR 56 | Ht 65.0 in | Wt 188.6 lb

## 2024-06-22 DIAGNOSIS — R5381 Other malaise: Secondary | ICD-10-CM | POA: Diagnosis not present

## 2024-06-22 DIAGNOSIS — U071 COVID-19: Secondary | ICD-10-CM | POA: Diagnosis not present

## 2024-06-22 LAB — POCT URINALYSIS DIP (MANUAL ENTRY)
Bilirubin, UA: NEGATIVE
Blood, UA: NEGATIVE
Glucose, UA: NEGATIVE mg/dL
Ketones, POC UA: NEGATIVE mg/dL
Nitrite, UA: NEGATIVE
Protein Ur, POC: NEGATIVE mg/dL
Spec Grav, UA: <=1.005 — AB (ref 1.010–1.025)
Urobilinogen, UA: 0.2 U/dL
pH, UA: 7 (ref 5.0–8.0)

## 2024-06-22 LAB — POC COVID19 BINAXNOW: SARS Coronavirus 2 Ag: POSITIVE — AB

## 2024-06-22 NOTE — Progress Notes (Signed)
 [facility] [address], [address] [place], [place] [address] [phone]  Date:  06/22/2024   Name:  [name]   DOB:  Dec 25, 1945   MRN:  [id number]  PCP:  Domenica Harlene LABOR, MD    Chief Complaint: Hypertension (Pt states she has lost 6-7 lbs in one week and feels week /Fatigue onset 06/15/2024)   History of Present Illness:  [name] is a 78 y.o. very pleasant female patient who presents with the following:  Pt seen today with concern of BP elevation- pt of Dr Domenica.  I have not seen her myself previously  Cardiology pt, saw Dr Okey in January  [name] is a 78 y.o. female with CAD - s/p NSTEMI 10/2011 with DESx2  Also has a hx of  GERD, HTN, HLD, vertigo.  L heart cath in Dec 2019 showed:  LAD with 25%  OM  with 100% with Left to R  collaterals  RCA with patent stent   Normal LVEF    Echo in early Sept 2020   LVEF and RVEF were normal   Mild diastolic dysfunction  In 2023 the pt had some numbness on L face   Seen by  neuro, ultimately felt not to represent a TIA  Echo done   Normal   Carotid USN without severe dz   Symptoms felt to be due to DJD of neck     Pt has felt dizzy and lost too much weight over the last week- she notes 7 lbs weight loss. By our scale she is about the same the patient notes her weight is decreased on her home scale She notes no fever but has chills and sweats She feels tired and not like eating Some cough and ST noted  She was sweating and felt weak over the last 7 days Her symptoms are rather hard to really describe but she is just not feeling well    Wt Readings from Last 3 Encounters:  06/22/24 188 lb 9.6 oz (85.5 kg)  06/15/24 190 lb (86.2 kg)  06/11/24 190 lb (86.2 kg)    Patient Active Problem List   Diagnosis Date Noted   Ganglion cyst 03/27/2024   Dysuria 03/27/2024   Loss of transverse plantar arch 02/05/2024   Bronchitis 01/07/2024   Onychomycosis 11/05/2023    CRI (chronic renal insufficiency) 07/16/2023   Arthritis of left midfoot 06/11/2023   Sinus tarsi syndrome of left ankle 04/03/2023   Sinus tarsi syndrome of right ankle 02/18/2023   Edema 02/17/2023   Acute prerenal azotemia 02/06/2023   TIA (transient ischemic attack) 02/05/2023   Paresthesia 08/28/2022   Chronic neck pain 08/28/2022   Neuropathy 08/21/2022   Vitamin deficiency 10/23/2021   Hypoglycemia 10/07/2019   Heart disease 01/29/2019   Sensorineural hearing loss (SNHL) of both ears 01/27/2019   Primary osteoarthritis of both knees 12/19/2018   DDD (degenerative disc disease), lumbar 12/19/2018   ANA positive 10/27/2018   Anxiety 02/17/2018   Peripheral arterial disease (HCC) 02/20/2017   Skin lesion of face 01/28/2017   Benign paroxysmal positional vertigo 08/05/2016   Antiplatelet or antithrombotic long-term use 03/27/2016   Right lumbar radiculopathy 02/21/2016   Abnormality of gait 02/21/2016   Zinc  deficiency 11/26/2015   Right hip pain 10/26/2015   Medicare annual wellness visit, subsequent 03/13/2015   Preventative health care 03/13/2015   Sun-damaged skin 12/05/2014   Angina of effort (HCC) 12/02/2014  Hx of colonic polyps 09/28/2014   Benign neoplasm of descending colon 09/28/2014   Other fatigue 09/10/2014   History of colonic polyps 07/16/2014   Bilateral thumb pain 03/20/2014   Postsurgical hypoparathyroidism (HCC) 08/03/2013   Neck pain on right side 07/13/2013   Chronic constipation 05/31/2013   Back pain 05/31/2013   Nocturia 05/31/2013   GERD (gastroesophageal reflux disease) 11/02/2012   Tension headache 11/02/2012   Anemia 06/11/2012   Hypokalemia 06/11/2012   Depression 01/21/2012   Headache 12/23/2011   Sinus bradycardia 12/07/2011   Hypocalcemia 12/07/2011   Acute MI, subendocardial (HCC) 11/20/2011   Acquired hypothyroidism 05/20/2009   Essential hypertension, benign 05/20/2009   Hyperlipidemia 03/08/2009   CAD, NATIVE VESSEL  03/08/2009    Past Medical History:  Diagnosis Date   Acute MI, subendocardial (HCC)    Arthritis    Back pain 05/31/2013   Benign paroxysmal positional vertigo 08/05/2016   CAD (coronary artery disease)    a. NSTEMI 10/2011: LHC 11/19/11: pLAD 30%, oOM 60%, AVCFX 30%, CFX after OM2 30%, pRCA 60 and 70%, then 99%, AM 80-90% with TIMI 3 flow.  PCI: Promus DES x 2 to RCA; b. 06/2012 Cath: patent RCA stents w/ subtl occl of Acute Marginal (jailed)->Med rx; c. 05/2015 MV: EF 59%, mod mid infsept/inf/ap lat/ap infarct with peri-infarct isch-->Med Rx; d. 02/2016 Cath: LM nl, LAD 91m, RI 50, RCA patent stents.   Colitis    Facial skin lesion 01/28/2017   GERD (gastroesophageal reflux disease)    occasional   Hyperlipidemia    Hypertension    Hypertensive heart disease    a. Echocardiogram 11/19/11: Difficult acoustic windows, EF 60-65%, normal LV wall thickness, grade 1 diastolic dysfunction   Hypoparathyroidism (HCC)    Low zinc  level 11/26/2016   Multinodular thyroid     Goiter s/p thyroidectomy in 2007 with post-op  hypocalcemia and post-op hypothyroidism/hypoparathyroidism with hypocalcemia   Neck pain on right side 07/13/2013   Nocturia 05/31/2013   Osteoarthritis    Pain in right axilla 03/13/2016   Palpitations    a. 03/2012 - patient set up for event monitor but did not wear correctly -  she declined wearing a repeat monitor   Post-surgical hypothyroidism    Sun-damaged skin 12/05/2014   Tubular adenoma of colon 06/2011   Unspecified constipation 05/31/2013   Vertigo    Zinc  deficiency 11/26/2015    Past Surgical History:  Procedure Laterality Date   CARDIAC CATHETERIZATION N/A 03/08/2016   Procedure: Left Heart Cath and Coronary Angiography;  Surgeon: Candyce GORMAN Reek, MD;  Location: [facility] INVASIVE CV LAB;  Service: Cardiovascular;  Laterality: N/A;   COLONOSCOPY  Aenomatous polyps   07/05/2011   COLONOSCOPY N/A 09/28/2014   Procedure: COLONOSCOPY;  Surgeon: Gwendlyn ONEIDA Buddy, MD;  Location:  [facility] ENDOSCOPY;  Service: Endoscopy;  Laterality: N/A;   CORONARY ANGIOPLASTY WITH STENT PLACEMENT     LEFT HEART CATH AND CORONARY ANGIOGRAPHY N/A 12/09/2018   Procedure: LEFT HEART CATH AND CORONARY ANGIOGRAPHY;  Surgeon: Reek Candyce GORMAN, MD;  Location: [facility] INVASIVE CV LAB;  Service: Cardiovascular;  Laterality: N/A;   LEFT HEART CATHETERIZATION WITH CORONARY ANGIOGRAM N/A 07/17/2012   Procedure: LEFT HEART CATHETERIZATION WITH CORONARY ANGIOGRAM;  Surgeon: Debby JONETTA Como, MD;  Location: [facility] CATH LAB;  Service: Cardiovascular;  Laterality: N/A;   PERCUTANEOUS CORONARY STENT INTERVENTION (PCI-S) N/A 11/19/2011   Procedure: PERCUTANEOUS CORONARY STENT INTERVENTION (PCI-S);  Surgeon: Debby JONETTA Como, MD;  Location: [facility] CATH LAB;  Service: Cardiovascular;  Laterality:  N/A;   POLYPECTOMY     SHOULDER ARTHROSCOPY W/ ROTATOR CUFF REPAIR     right   TOTAL THYROIDECTOMY  2007   GOITER    Social History   Tobacco Use   Smoking status: Never   Smokeless tobacco: Never  Vaping Use   Vaping status: Never Used  Substance Use Topics   Alcohol use: Not Currently   Drug use: No    Family History  Problem Relation Age of Onset   Colon cancer Brother    Cancer Brother        COLON   Hypertension Father    Heart disease Father    Heart attack Father    Blindness Sister    Congestive Heart Failure Sister    Hypertension Sister    Healthy Daughter    Healthy Son    Thyroid  disease Sister    Cancer Brother        multiple myelomas   Healthy Daughter    Healthy Daughter    Diabetes Neg Hx    Prostate cancer Neg Hx    Breast cancer Neg Hx    Esophageal cancer Neg Hx    Rectal cancer Neg Hx    Stomach cancer Neg Hx     Allergies  Allergen Reactions   Zetia  [Ezetimibe ] Other (See Comments)    Myalgia, paresthesias and weakness   Penicillin G Rash    Medication list has been reviewed and updated.  Current Outpatient Medications on File Prior to Visit  Medication Sig Dispense  Refill   albuterol  (VENTOLIN  HFA) 108 (90 Base) MCG/ACT inhaler Inhale 2 puffs into the lungs every 6 (six) hours as needed for wheezing or shortness of breath. 8 g 0   amLODipine  (NORVASC ) 10 MG tablet TAKE 1 TABLET BY MOUTH DAILY 90 tablet 3   aspirin  EC 81 MG tablet Take 81 mg by mouth daily.     B Complex-C (B-COMPLEX WITH VITAMIN C) tablet Take 1 tablet by mouth daily.     Bempedoic Acid  (NEXLETOL ) 180 MG TABS Take 1 tablet (180 mg total) by mouth daily. 30 tablet 11   bisoprolol  (ZEBETA ) 5 MG tablet Take 0.5 tablets (2.5 mg total) by mouth daily. 45 tablet 1   calcitRIOL  (ROCALTROL ) 0.25 MCG capsule TAKE 3 CAPSULES BY MOUTH DAILY 300 capsule 3   calcium  carbonate (TUMS) 500 MG chewable tablet Chew 2 tablets (400 mg of elemental calcium  total) by mouth 2 (two) times daily.     Cholecalciferol  (VITAMIN D ) 125 MCG (5000 UT) CAPS Take 5,000 Units by mouth daily in the afternoon. 100 capsule 3   ciclopirox  (PENLAC ) 8 % solution Apply topically at bedtime. Apply over nail and surrounding skin. Apply daily over previous coat. After seven (7) days, may remove with alcohol and continue cycle. 6.6 mL 0   clopidogrel  (PLAVIX ) 75 MG tablet TAKE 1 TABLET BY MOUTH DAILY 90 tablet 3   conjugated estrogens  (PREMARIN ) vaginal cream Apply small amount to vaginal mucosa twice weekly 42.5 g 1   furosemide  (LASIX ) 20 MG tablet TAKE 1 TABLET BY MOUTH ONCE  WEEKLY AS NEEDED 12 tablet 3   hydrALAZINE  (APRESOLINE ) 10 MG tablet TAKE 1/2 TABLET (5 MG) BY MOUTH AS NEEDED FOR BP ABOVE 160 OR GREATER 30 tablet 6   hydrocortisone  (ANUSOL -HC) 2.5 % rectal cream Place 1 Application rectally 2 (two) times daily. 30 g 1   isosorbide  mononitrate (IMDUR ) 60 MG 24 hr tablet TAKE 1 TABLET BY MOUTH DAILY 90 tablet 2  levothyroxine  (SYNTHROID ) 112 MCG tablet Take 1 tablet (112 mcg total) by mouth 3 (three) times a week. 90 tablet 1   Multiple Vitamin (MULTIVITAMIN WITH MINERALS) TABS tablet Take 1 tablet by mouth daily.      nitroGLYCERIN  (NITROSTAT ) 0.4 MG SL tablet DISSOLVE 1 TABLET UNDER THE  TONGUE EVERY 5 MINUTES AS NEEDED FOR CHEST PAIN. MAX OF 3 TABLETS IN 15 MINUTES. CALL 911 IF PAIN  PERSISTS. 75 tablet 3   pantoprazole  (PROTONIX ) 40 MG tablet TAKE 1 TABLET BY MOUTH DAILY 100 tablet 2   REPATHA  SURECLICK 140 MG/ML SOAJ INJECT 140MG  SUBCUTANEOUSLY  EVERY 2 WEEKS 6 mL 3   urea  (CARMOL) 10 % cream Apply topically as needed. 71 g 0   rosuvastatin  (CRESTOR ) 20 MG tablet Take 1 tablet (20 mg total) by mouth daily. 90 tablet 3   No current facility-administered medications on file prior to visit.    Review of Systems:  As per HPI- otherwise negative.   Physical Examination: Vitals:   06/22/24 1305  BP: 130/72  Pulse: (!) 56  SpO2: 97%   Vitals:   06/22/24 1305  Weight: 188 lb 9.6 oz (85.5 kg)  Height: 5' 5 (1.651 m)   Body mass index is 31.38 kg/m. Ideal Body Weight: Weight in (lb) to have BMI = 25: 149.9  GEN: no acute distress. HEENT: Atraumatic, Normocephalic.  Ears and Nose: No external deformity. CV: RRR, No M/G/R. No JVD. No thrill. No extra heart sounds. PULM: CTA B, no wheezes, crackles, rhonchi. No retractions. No resp. distress. No accessory muscle use. ABD: S, NT, ND, +BS. No rebound. No HSM. EXTR: No c/c/e PSYCH: Normally interactive. Conversant.   BP Readings from Last 3 Encounters:  06/22/24 130/72  06/15/24 134/72  06/11/24 (!) 145/52   Pulse Readings from Last 3 Encounters:  06/22/24 (!) 56  06/15/24 (!) 50  06/11/24 (!) 52   Results for orders placed or performed in visit on 06/22/24  POC COVID-19 BinaxNow   Collection Time: 06/22/24  1:23 PM  Result Value Ref Range   SARS Coronavirus 2 Ag Positive (A) Negative  POCT urinalysis dipstick   Collection Time: 06/22/24  1:29 PM  Result Value Ref Range   Color, UA yellow yellow   Clarity, UA clear clear   Glucose, UA negative negative mg/dL   Bilirubin, UA negative negative   Ketones, POC UA negative negative  mg/dL   Spec Grav, UA <=8.994 (A) 1.010 - 1.025   Blood, UA negative negative   pH, UA 7.0 5.0 - 8.0   Protein Ur, POC negative negative mg/dL   Urobilinogen, UA 0.2 0.2 or 1.0 E.U./dL   Nitrite, UA Negative Negative   Leukocytes, UA Trace (A) Negative   *Note: Due to a large number of results and/or encounters for the requested time period, some results have not been displayed. A complete set of results can be found in Results Review.    Assessment and Plan: COVID-19  Malaise - Plan: POC COVID-19 BinaxNow, POCT urinalysis dipstick, Urine Culture, CANCELED: EKG 12-Lead  Patient seen today with concern of not feeling well for about a week.  Her symptoms are somewhat vague but include fatigue and lack of appetite.  On testing today she is positive for COVID-19.  Urine is most likely negative, we did send for culture.  She recently had lab work and a chest x-ray  Since she has been ill with COVID for 1 week at this point Paxlovid is not indicated.  Her  symptoms are mild to moderate.  I encouraged her to rest and drink plenty of fluids.  If not continue to improve over the next several days she will let me know-Sooner if worse.     Signed Harlene Schroeder, MD

## 2024-06-24 ENCOUNTER — Encounter: Payer: Self-pay | Admitting: Family Medicine

## 2024-06-24 LAB — URINE CULTURE
MICRO NUMBER:: 16646217
Result:: NO GROWTH
SPECIMEN QUALITY:: ADEQUATE

## 2024-07-01 ENCOUNTER — Ambulatory Visit (HOSPITAL_COMMUNITY)
Admission: RE | Admit: 2024-07-01 | Discharge: 2024-07-01 | Disposition: A | Discharge status: Home / Self Care | Source: Ambulatory Visit | Attending: Orthopedic Surgery | Admitting: Orthopedic Surgery

## 2024-07-01 DIAGNOSIS — R2231 Localized swelling, mass and lump, right upper limb: Secondary | ICD-10-CM | POA: Diagnosis not present

## 2024-07-14 ENCOUNTER — Ambulatory Visit (INDEPENDENT_AMBULATORY_CARE_PROVIDER_SITE_OTHER)

## 2024-07-14 VITALS — Ht 65.0 in | Wt 188.0 lb

## 2024-07-14 DIAGNOSIS — Z0000 Encounter for general adult medical examination without abnormal findings: Secondary | ICD-10-CM | POA: Diagnosis not present

## 2024-07-14 NOTE — Progress Notes (Signed)
 Subjective:   [name] is a 78 y.o. who presents for a Medicare Wellness preventive visit.  As a reminder, Annual Wellness Visits don't include a physical exam, and some assessments may be limited, especially if this visit is performed virtually. We may recommend an in-person follow-up visit with your provider if needed.  Visit Complete: Virtual I connected with  [name] on 07/14/24 by a audio enabled telemedicine application and verified that I am speaking with the correct person using two identifiers.  Patient Location: Home  Provider Location: Home Office  I discussed the limitations of evaluation and management by telemedicine. The patient expressed understanding and agreed to proceed.  Vital Signs: Because this visit was a virtual/telehealth visit, some criteria may be missing or patient reported. Any vitals not documented were not able to be obtained and vitals that have been documented are patient reported.    Persons Participating in Visit: Patient.  AWV Questionnaire: No: Patient Medicare AWV questionnaire was not completed prior to this visit.  Cardiac Risk Factors include: advanced age (>76men, >28 women);hypertension     Objective:    Today's Vitals   07/14/24 1058  Weight: 188 lb (85.3 kg)  Height: 5' 5 (1.651 m)   Body mass index is 31.28 kg/m.     07/14/2024   11:03 AM 06/11/2024    3:48 PM 10/22/2023    6:50 PM 10/16/2023   11:21 PM 06/02/2023   11:55 PM 02/05/2023   11:00 PM 11/06/2022   11:11 AM  Advanced Directives  Does Patient Have a Medical Advance Directive? No No No No No No No  Would patient like information on creating a medical advance directive? No - Patient declined No - Patient declined  No - Patient declined No - Patient declined No - Patient declined No - Patient declined    Current Medications (verified) Outpatient Encounter Medications as of 07/14/2024  Medication Sig   albuterol  (VENTOLIN  HFA) 108 (90 Base) MCG/ACT  inhaler Inhale 2 puffs into the lungs every 6 (six) hours as needed for wheezing or shortness of breath.   amLODipine  (NORVASC ) 10 MG tablet TAKE 1 TABLET BY MOUTH DAILY   aspirin  EC 81 MG tablet Take 81 mg by mouth daily.   B Complex-C (B-COMPLEX WITH VITAMIN C) tablet Take 1 tablet by mouth daily.   Bempedoic Acid  (NEXLETOL ) 180 MG TABS Take 1 tablet (180 mg total) by mouth daily.   bisoprolol  (ZEBETA ) 5 MG tablet Take 0.5 tablets (2.5 mg total) by mouth daily.   calcitRIOL  (ROCALTROL ) 0.25 MCG capsule TAKE 3 CAPSULES BY MOUTH DAILY   calcium  carbonate (TUMS) 500 MG chewable tablet Chew 2 tablets (400 mg of elemental calcium  total) by mouth 2 (two) times daily.   Cholecalciferol  (VITAMIN D ) 125 MCG (5000 UT) CAPS Take 5,000 Units by mouth daily in the afternoon.   ciclopirox  (PENLAC ) 8 % solution Apply topically at bedtime. Apply over nail and surrounding skin. Apply daily over previous coat. After seven (7) days, may remove with alcohol and continue cycle.   clopidogrel  (PLAVIX ) 75 MG tablet TAKE 1 TABLET BY MOUTH DAILY   conjugated estrogens  (PREMARIN ) vaginal cream Apply small amount to vaginal mucosa twice weekly   furosemide  (LASIX ) 20 MG tablet TAKE 1 TABLET BY MOUTH ONCE  WEEKLY AS NEEDED   hydrALAZINE  (APRESOLINE ) 10 MG tablet TAKE 1/2 TABLET (5 MG) BY MOUTH AS NEEDED FOR BP ABOVE 160 OR GREATER   hydrocortisone  (ANUSOL -HC) 2.5 % rectal cream Place 1 Application  rectally 2 (two) times daily.   isosorbide  mononitrate (IMDUR ) 60 MG 24 hr tablet TAKE 1 TABLET BY MOUTH DAILY   levothyroxine  (SYNTHROID ) 112 MCG tablet Take 1 tablet (112 mcg total) by mouth 3 (three) times a week.   Multiple Vitamin (MULTIVITAMIN WITH MINERALS) TABS tablet Take 1 tablet by mouth daily.   nitroGLYCERIN  (NITROSTAT ) 0.4 MG SL tablet DISSOLVE 1 TABLET UNDER THE  TONGUE EVERY 5 MINUTES AS NEEDED FOR CHEST PAIN. MAX OF 3 TABLETS IN 15 MINUTES. CALL 911 IF PAIN  PERSISTS.   pantoprazole  (PROTONIX ) 40 MG tablet TAKE 1  TABLET BY MOUTH DAILY   REPATHA  SURECLICK 140 MG/ML SOAJ INJECT 140MG  SUBCUTANEOUSLY  EVERY 2 WEEKS   rosuvastatin  (CRESTOR ) 20 MG tablet Take 1 tablet (20 mg total) by mouth daily.   urea  (CARMOL) 10 % cream Apply topically as needed.   No facility-administered encounter medications on file as of 07/14/2024.    Allergies (verified) Zetia  [ezetimibe ] and Penicillin g   History: Past Medical History:  Diagnosis Date   Acute MI, subendocardial (HCC)    Arthritis    Back pain 05/31/2013   Benign paroxysmal positional vertigo 08/05/2016   CAD (coronary artery disease)    a. NSTEMI 10/2011: LHC 11/19/11: pLAD 30%, oOM 60%, AVCFX 30%, CFX after OM2 30%, pRCA 60 and 70%, then 99%, AM 80-90% with TIMI 3 flow.  PCI: Promus DES x 2 to RCA; b. 06/2012 Cath: patent RCA stents w/ subtl occl of Acute Marginal (jailed)->Med rx; c. 05/2015 MV: EF 59%, mod mid infsept/inf/ap lat/ap infarct with peri-infarct isch-->Med Rx; d. 02/2016 Cath: LM nl, LAD 5m, RI 50, RCA patent stents.   Colitis    Facial skin lesion 01/28/2017   GERD (gastroesophageal reflux disease)    occasional   Hyperlipidemia    Hypertension    Hypertensive heart disease    a. Echocardiogram 11/19/11: Difficult acoustic windows, EF 60-65%, normal LV wall thickness, grade 1 diastolic dysfunction   Hypoparathyroidism (HCC)    Low zinc  level 11/26/2016   Multinodular thyroid     Goiter s/p thyroidectomy in 2007 with post-op  hypocalcemia and post-op hypothyroidism/hypoparathyroidism with hypocalcemia   Neck pain on right side 07/13/2013   Nocturia 05/31/2013   Osteoarthritis    Pain in right axilla 03/13/2016   Palpitations    a. 03/2012 - patient set up for event monitor but did not wear correctly -  she declined wearing a repeat monitor   Post-surgical hypothyroidism    Sun-damaged skin 12/05/2014   Tubular adenoma of colon 06/2011   Unspecified constipation 05/31/2013   Vertigo    Zinc  deficiency 11/26/2015   Past Surgical History:   Procedure Laterality Date   CARDIAC CATHETERIZATION N/A 03/08/2016   Procedure: Left Heart Cath and Coronary Angiography;  Surgeon: Candyce GORMAN Reek, MD;  Location: [facility] INVASIVE CV LAB;  Service: Cardiovascular;  Laterality: N/A;   COLONOSCOPY  Aenomatous polyps   07/05/2011   COLONOSCOPY N/A 09/28/2014   Procedure: COLONOSCOPY;  Surgeon: Gwendlyn ONEIDA Buddy, MD;  Location: [facility] ENDOSCOPY;  Service: Endoscopy;  Laterality: N/A;   CORONARY ANGIOPLASTY WITH STENT PLACEMENT     LEFT HEART CATH AND CORONARY ANGIOGRAPHY N/A 12/09/2018   Procedure: LEFT HEART CATH AND CORONARY ANGIOGRAPHY;  Surgeon: Reek Candyce GORMAN, MD;  Location: [facility] INVASIVE CV LAB;  Service: Cardiovascular;  Laterality: N/A;   LEFT HEART CATHETERIZATION WITH CORONARY ANGIOGRAM N/A 07/17/2012   Procedure: LEFT HEART CATHETERIZATION WITH CORONARY ANGIOGRAM;  Surgeon: Debby JONETTA Como, MD;  Location:  [facility] CATH LAB;  Service: Cardiovascular;  Laterality: N/A;   PERCUTANEOUS CORONARY STENT INTERVENTION (PCI-S) N/A 11/19/2011   Procedure: PERCUTANEOUS CORONARY STENT INTERVENTION (PCI-S);  Surgeon: Debby JONETTA Como, MD;  Location: [facility] CATH LAB;  Service: Cardiovascular;  Laterality: N/A;   POLYPECTOMY     SHOULDER ARTHROSCOPY W/ ROTATOR CUFF REPAIR     right   TOTAL THYROIDECTOMY  2007   GOITER   Family History  Problem Relation Age of Onset   Colon cancer Brother    Cancer Brother        COLON   Hypertension Father    Heart disease Father    Heart attack Father    Blindness Sister    Congestive Heart Failure Sister    Hypertension Sister    Healthy Daughter    Healthy Son    Thyroid  disease Sister    Cancer Brother        multiple myelomas   Healthy Daughter    Healthy Daughter    Diabetes Neg Hx    Prostate cancer Neg Hx    Breast cancer Neg Hx    Esophageal cancer Neg Hx    Rectal cancer Neg Hx    Stomach cancer Neg Hx    Social History   Socioeconomic History   Marital status: Married    Spouse name: Not on  file   Number of children: 4   Years of education: 14   Highest education level: Not on file  Occupational History   Occupation: Sales person at [employer]  Tobacco Use   Smoking status: Never   Smokeless tobacco: Never  Vaping Use   Vaping status: Never Used  Substance and Sexual Activity   Alcohol use: Not Currently   Drug use: No   Sexual activity: Not Currently  Other Topics Concern   Not on file  Social History Narrative   Lives at home alone.  Her son lives near her.   Right-handed.   1 cup coffee per day.   Social Drivers of Corporate investment banker Strain: Low Risk  (07/14/2024)   Overall Financial Resource Strain (CARDIA)    Difficulty of Paying Living Expenses: Not hard at all  Food Insecurity: No Food Insecurity (07/14/2024)   Hunger Vital Sign    Worried About Running Out of Food in the Last Year: Never true    Ran Out of Food in the Last Year: Never true  Transportation Needs: No Transportation Needs (07/14/2024)   PRAPARE - Administrator, Civil Service (Medical): No    Lack of Transportation (Non-Medical): No  Physical Activity: Inactive (07/14/2024)   Exercise Vital Sign    Days of Exercise per Week: 0 days    Minutes of Exercise per Session: 0 min  Stress: No Stress Concern Present (07/14/2024)   [employer] of Occupational Health - Occupational Stress Questionnaire    Feeling of Stress: Not at all  Social Connections: Moderately Integrated (07/14/2024)   Social Connection and Isolation Panel    Frequency of Communication with Friends and Family: More than three times a week    Frequency of Social Gatherings with Friends and Family: More than three times a week    Attends Religious Services: More than 4 times per year    Active Member of [employer] or Organizations: Yes    Attends Banker Meetings: More than 4 times per year    Marital Status: Widowed    Tobacco Counseling Counseling given: Not Answered  Clinical  Intake:  Pre-visit preparation completed: Yes  Pain : No/denies pain     BMI - recorded: 31.28 Nutritional Status: BMI > 30  Obese Nutritional Risks: None Diabetes: No  Lab Results  Component Value Date   HGBA1C 5.8 (H) 05/04/2024   HGBA1C 5.5 10/15/2023   HGBA1C 5.8 07/16/2023     How often do you need to have someone help you when you read instructions, pamphlets, or other written materials from your doctor or pharmacy?: 1 - Never  Interpreter Needed?: No  Information entered by :: Taylor Blush LPN   Activities of Daily Living     07/14/2024   11:02 AM  In your present state of health, do you have any difficulty performing the following activities:  Hearing? 0  Vision? 0  Difficulty concentrating or making decisions? 0  Walking or climbing stairs? 1  Comment Uses a Cane  Dressing or bathing? 0  Doing errands, shopping? 0  Preparing Food and eating ? N  Using the Toilet? N  In the past six months, have you accidently leaked urine? N  Do you have problems with loss of bowel control? N  Managing your Medications? N  Managing your Finances? N  Housekeeping or managing your Housekeeping? N    Patient Care Team: Domenica Harlene LABOR, MD as PCP - General (Family Medicine) Okey Vina GAILS, MD as PCP - Cardiology (Cardiology) Aneita Gwendlyn DASEN, MD (Inactive) as Consulting Physician (Gastroenterology) Okey Vina GAILS, MD as Consulting Physician (Cardiology) Bonner Ade, MD as Consulting Physician (Physical Medicine and Rehabilitation)  I have updated your Care Teams any recent Medical Services you may have received from other providers in the past year.     Assessment:   This is a routine wellness examination for [name].  Hearing/Vision screen Hearing Screening - Comments:: Denies hearing difficulties   Vision Screening - Comments:: Wears rx glasses - up to date with routine eye exams with  [facility]   Goals Addressed               This Visit's Progress      Increase physical activity (pt-stated)        Get more active       Depression Screen     07/14/2024   11:02 AM 06/15/2024   10:25 AM 12/10/2023    2:05 PM 08/15/2023    3:51 PM 07/16/2023   11:47 AM 02/11/2023    2:14 PM 11/06/2022    2:48 PM  PHQ 2/9 Scores  PHQ - 2 Score 0 0 0 0 0 0 0  PHQ- 9 Score 0 0 0        Fall Risk     07/14/2024   11:03 AM 06/15/2024   10:24 AM 12/10/2023    2:05 PM 08/15/2023    3:51 PM 07/16/2023   11:47 AM  Fall Risk   Falls in the past year? 1 0 0 0 0  Number falls in past yr: 0 0 0 0 0  Injury with Fall? 0 0 0 0 0  Risk for fall due to : No Fall Risks No Fall Risks No Fall Risks    Follow up Falls evaluation completed Falls evaluation completed Falls evaluation completed Falls evaluation completed Falls evaluation completed    MEDICARE RISK AT HOME:  Medicare Risk at Home Any stairs in or around the home?: No If so, are there any without handrails?: No Home free of loose throw rugs in walkways, pet beds,  electrical cords, etc?: Yes Adequate lighting in your home to reduce risk of falls?: Yes Life alert?: No Use of a cane, walker or w/c?: Yes Grab bars in the bathroom?: No Shower chair or bench in shower?: No Elevated toilet seat or a handicapped toilet?: No  TIMED UP AND GO:  Was the test performed?  No  Cognitive Function: 6CIT completed        07/14/2024   11:04 AM  6CIT Screen  What Year? 0 points  What month? 0 points  What time? 0 points  Count back from 20 0 points  Months in reverse 0 points  Repeat phrase 0 points  Total Score 0 points    Immunizations Immunization History  Administered Date(s) Administered   Fluad Quad(high Dose 65+) 09/24/2019, 10/23/2021, 11/06/2022   Influenza,inj,Quad PF,6+ Mos 09/14/2013   PFIZER(Purple Top)SARS-COV-2 Vaccination 01/28/2020, 02/22/2020, 12/07/2020   Pneumococcal Conjugate-13 09/12/2015   Pneumococcal Polysaccharide-23 06/05/2012   Tdap 12/27/2003, 03/15/2014     Screening Tests Health Maintenance  Topic Date Due   Zoster Vaccines- Shingrix (1 of 2) Never done   DTaP/Tdap/Td (3 - Td or Tdap) 03/15/2024   Medicare Annual Wellness (AWV)  07/14/2025   DEXA SCAN  01/08/2026   Colonoscopy  02/05/2027   Pneumococcal Vaccine: 50+ Years  Completed   Hepatitis B Vaccines  Aged Out   HPV VACCINES  Aged Out   Meningococcal B Vaccine  Aged Out   INFLUENZA VACCINE  Discontinued   COVID-19 Vaccine  Discontinued   Hepatitis C Screening  Discontinued    Health Maintenance  Health Maintenance Due  Topic Date Due   Zoster Vaccines- Shingrix (1 of 2) Never done   DTaP/Tdap/Td (3 - Td or Tdap) 03/15/2024   Health Maintenance Items Addressed:   Additional Screening:  Vision Screening: Recommended annual ophthalmology exams for early detection of glaucoma and other disorders of the eye. Would you like a referral to an eye doctor? No    Dental Screening: Recommended annual dental exams for proper oral hygiene  Community Resource Referral / Chronic Care Management: CRR required this visit?  No   CCM required this visit?  No   Plan:    I have personally reviewed and noted the following in the patient's chart:   Medical and social history Use of alcohol, tobacco or illicit drugs  Current medications and supplements including opioid prescriptions. Patient is not currently taking opioid prescriptions. Functional ability and status Nutritional status Physical activity Advanced directives List of other physicians Hospitalizations, surgeries, and ER visits in previous 12 months Vitals Screenings to include cognitive, depression, and falls Referrals and appointments  In addition, I have reviewed and discussed with patient certain preventive protocols, quality metrics, and best practice recommendations. A written personalized care plan for preventive services as well as general preventive health recommendations were provided to  patient.   Taylor LELON Blush, LPN   2/77/7974   After Visit Summary: (MyChart) Due to this being a telephonic visit, the after visit summary with patients personalized plan was offered to patient via MyChart   Notes: Nothing significant to report at this time.

## 2024-07-14 NOTE — Patient Instructions (Addendum)
 Ms. [name] , Thank you for taking time out of your busy schedule to complete your Annual Wellness Visit with me. I enjoyed our conversation and look forward to speaking with you again next year. I, as well as your care team,  appreciate your ongoing commitment to your health goals. Please review the following plan we discussed and let me know if I can assist you in the future. Your Game plan/ To Do List    Referrals: If you haven't heard from the office you've been referred to, please reach out to them at the phone provided.   Follow up Visits: Next Medicare AWV with our clinical staff: 07/20/25 @ 10:50a   Have you seen your provider in the last 6 months (3 months if uncontrolled diabetes)?  Next Office Visit with your provider: 10/12/24 @ 1:20p  Clinician Recommendations:  Aim for 30 minutes of exercise or brisk walking, 6-8 glasses of water, and 5 servings of fruits and vegetables each day.       This is a list of the screening recommended for you and due dates:  Health Maintenance  Topic Date Due   Zoster (Shingles) Vaccine (1 of 2) Never done   DTaP/Tdap/Td vaccine (3 - Td or Tdap) 03/15/2024   Medicare Annual Wellness Visit  07/14/2025   DEXA scan (bone density measurement)  01/08/2026   Colon Cancer Screening  02/05/2027   Pneumococcal Vaccine for age over 29  Completed   Hepatitis B Vaccine  Aged Out   HPV Vaccine  Aged Out   Meningitis B Vaccine  Aged Out   Flu Shot  Discontinued   COVID-19 Vaccine  Discontinued   Hepatitis C Screening  Discontinued    Advanced directives: (Declined) Advance directive discussed with you today. Even though you declined this today, please call our office should you change your mind, and we can give you the proper paperwork for you to fill out. Advance Care Planning is important because it:  [x]  Makes sure you receive the medical care that is consistent with your values, goals, and preferences  [x]  It provides guidance to your family and  loved ones and reduces their decisional burden about whether or not they are making the right decisions based on your wishes.  Follow the link provided in your after visit summary or read over the paperwork we have mailed to you to help you started getting your Advance Directives in place. If you need assistance in completing these, please reach out to us  so that we can help you!  See attachments for Preventive Care and Fall Prevention Tips.

## 2024-07-16 ENCOUNTER — Other Ambulatory Visit: Payer: Self-pay | Admitting: Family

## 2024-07-16 DIAGNOSIS — E876 Hypokalemia: Secondary | ICD-10-CM

## 2024-07-16 DIAGNOSIS — E039 Hypothyroidism, unspecified: Secondary | ICD-10-CM

## 2024-07-16 DIAGNOSIS — I10 Essential (primary) hypertension: Secondary | ICD-10-CM

## 2024-07-23 ENCOUNTER — Encounter: Payer: Self-pay | Admitting: Physician Assistant

## 2024-07-23 ENCOUNTER — Ambulatory Visit (INDEPENDENT_AMBULATORY_CARE_PROVIDER_SITE_OTHER): Admitting: Physician Assistant

## 2024-07-23 VITALS — BP 125/62 | HR 59 | Ht 65.0 in | Wt 191.2 lb

## 2024-07-23 DIAGNOSIS — M67431 Ganglion, right wrist: Secondary | ICD-10-CM | POA: Diagnosis not present

## 2024-07-23 NOTE — Progress Notes (Signed)
 Established patient visit   Patient: [name]   DOB: April 15, 1946   78 y.o. Female  MRN: [id number] Visit Date: 07/23/2024  Today's healthcare provider: Manuelita Flatness, PA-C   Cc. Right wrist pain, cyst  Subjective    Discussed the use of AI scribe software for clinical note transcription with the patient, who gave verbal consent to proceed.  History of Present Illness   [name] is a 78 year old female presents with pain in her right hand due to a cyst.  She experiences persistent pain in her right wrist with radiation from the thumb down the entire arm. The pain intensifies at night, causing discomfort and swelling, significantly impacting her daily activities. Biofreeze and hand elevation provide some relief, but Voltaren cream is ineffective. Pt report she never received news of the ultrasound report. Declines to go back to orthopedic provider     Medications: Outpatient Medications Prior to Visit  Medication Sig   albuterol  (VENTOLIN  HFA) 108 (90 Base) MCG/ACT inhaler Inhale 2 puffs into the lungs every 6 (six) hours as needed for wheezing or shortness of breath.   amLODipine  (NORVASC ) 10 MG tablet TAKE 1 TABLET BY MOUTH DAILY   aspirin  EC 81 MG tablet Take 81 mg by mouth daily.   B Complex-C (B-COMPLEX WITH VITAMIN C) tablet Take 1 tablet by mouth daily.   Bempedoic Acid  (NEXLETOL ) 180 MG TABS Take 1 tablet (180 mg total) by mouth daily.   bisoprolol  (ZEBETA ) 5 MG tablet Take 0.5 tablets (2.5 mg total) by mouth daily.   calcitRIOL  (ROCALTROL ) 0.25 MCG capsule TAKE 3 CAPSULES BY MOUTH DAILY   calcium  carbonate (TUMS) 500 MG chewable tablet Chew 2 tablets (400 mg of elemental calcium  total) by mouth 2 (two) times daily.   Cholecalciferol  (VITAMIN D ) 125 MCG (5000 UT) CAPS Take 5,000 Units by mouth daily in the afternoon.   ciclopirox  (PENLAC ) 8 % solution Apply topically at bedtime. Apply over nail and surrounding skin. Apply daily over previous coat. After  seven (7) days, may remove with alcohol and continue cycle.   clopidogrel  (PLAVIX ) 75 MG tablet TAKE 1 TABLET BY MOUTH DAILY   conjugated estrogens  (PREMARIN ) vaginal cream Apply small amount to vaginal mucosa twice weekly   furosemide  (LASIX ) 20 MG tablet TAKE 1 TABLET BY MOUTH ONCE  WEEKLY AS NEEDED   hydrALAZINE  (APRESOLINE ) 10 MG tablet TAKE 1/2 TABLET (5 MG) BY MOUTH AS NEEDED FOR BP ABOVE 160 OR GREATER   hydrocortisone  (ANUSOL -HC) 2.5 % rectal cream Place 1 Application rectally 2 (two) times daily.   isosorbide  mononitrate (IMDUR ) 60 MG 24 hr tablet TAKE 1 TABLET BY MOUTH DAILY   levothyroxine  (SYNTHROID ) 112 MCG tablet Take 1 tablet (112 mcg total) by mouth 3 (three) times a week.   Multiple Vitamin (MULTIVITAMIN WITH MINERALS) TABS tablet Take 1 tablet by mouth daily.   nitroGLYCERIN  (NITROSTAT ) 0.4 MG SL tablet DISSOLVE 1 TABLET UNDER THE  TONGUE EVERY 5 MINUTES AS NEEDED FOR CHEST PAIN. MAX OF 3 TABLETS IN 15 MINUTES. CALL 911 IF PAIN  PERSISTS.   pantoprazole  (PROTONIX ) 40 MG tablet TAKE 1 TABLET BY MOUTH DAILY   REPATHA  SURECLICK 140 MG/ML SOAJ INJECT 140MG  SUBCUTANEOUSLY  EVERY 2 WEEKS   rosuvastatin  (CRESTOR ) 20 MG tablet Take 1 tablet (20 mg total) by mouth daily.   urea  (CARMOL) 10 % cream Apply topically as needed.   No facility-administered medications prior to visit.    Review of Systems  Constitutional:  Negative for fatigue and fever.  Respiratory:  Negative for cough and shortness of breath.   Cardiovascular:  Negative for chest pain and leg swelling.  Gastrointestinal:  Negative for abdominal pain.  Musculoskeletal:  Positive for arthralgias.  Neurological:  Negative for dizziness and headaches.       Objective    BP 125/62   Pulse (!) 59   Ht 5' 5 (1.651 m)   Wt 191 lb 3.2 oz (86.7 kg)   BMI 31.82 kg/m    Physical Exam Vitals reviewed.  Constitutional:      Appearance: She is not ill-appearing.  HENT:     Head: Normocephalic.  Eyes:      Conjunctiva/sclera: Conjunctivae normal.  Cardiovascular:     Rate and Rhythm: Normal rate.  Pulmonary:     Effort: Pulmonary effort is normal. No respiratory distress.  Musculoskeletal:     Comments: ~ 1 cm cyst, firm on right wrist  Neurological:     Mental Status: She is alert and oriented to person, place, and time.  Psychiatric:        Mood and Affect: Mood normal.        Behavior: Behavior normal.    No results found for any visits on 07/23/24.  Assessment & Plan    Ganglion cyst of wrist, right -     Ambulatory referral to Sports Medicine  Pt declines to go back to original ortho provider.  US  confirmed cyst Ref to sports med  Return if symptoms worsen or fail to improve.       Manuelita Flatness, PA-C  [facility] at [facility] [phone] (phone) [phone] (fax)  [facility]

## 2024-07-24 DIAGNOSIS — I83893 Varicose veins of bilateral lower extremities with other complications: Secondary | ICD-10-CM | POA: Diagnosis not present

## 2024-07-24 DIAGNOSIS — R600 Localized edema: Secondary | ICD-10-CM | POA: Diagnosis not present

## 2024-07-24 DIAGNOSIS — I872 Venous insufficiency (chronic) (peripheral): Secondary | ICD-10-CM | POA: Diagnosis not present

## 2024-08-04 ENCOUNTER — Emergency Department (HOSPITAL_BASED_OUTPATIENT_CLINIC_OR_DEPARTMENT_OTHER)
Admission: EM | Admit: 2024-08-04 | Discharge: 2024-08-04 | Disposition: A | Discharge status: Home / Self Care | Attending: Emergency Medicine | Admitting: Emergency Medicine

## 2024-08-04 ENCOUNTER — Emergency Department (HOSPITAL_BASED_OUTPATIENT_CLINIC_OR_DEPARTMENT_OTHER)

## 2024-08-04 ENCOUNTER — Encounter: Payer: Self-pay | Admitting: Family Medicine

## 2024-08-04 ENCOUNTER — Ambulatory Visit (INDEPENDENT_AMBULATORY_CARE_PROVIDER_SITE_OTHER): Admitting: Family Medicine

## 2024-08-04 ENCOUNTER — Encounter (HOSPITAL_BASED_OUTPATIENT_CLINIC_OR_DEPARTMENT_OTHER): Payer: Self-pay

## 2024-08-04 ENCOUNTER — Other Ambulatory Visit: Payer: Self-pay | Admitting: Internal Medicine

## 2024-08-04 ENCOUNTER — Other Ambulatory Visit: Payer: Self-pay

## 2024-08-04 VITALS — BP 125/50 | Ht 65.0 in | Wt 190.0 lb

## 2024-08-04 DIAGNOSIS — M67431 Ganglion, right wrist: Secondary | ICD-10-CM | POA: Diagnosis not present

## 2024-08-04 DIAGNOSIS — I700 Atherosclerosis of aorta: Secondary | ICD-10-CM | POA: Diagnosis not present

## 2024-08-04 DIAGNOSIS — Z7901 Long term (current) use of anticoagulants: Secondary | ICD-10-CM | POA: Insufficient documentation

## 2024-08-04 DIAGNOSIS — R079 Chest pain, unspecified: Secondary | ICD-10-CM | POA: Insufficient documentation

## 2024-08-04 DIAGNOSIS — E892 Postprocedural hypoparathyroidism: Secondary | ICD-10-CM

## 2024-08-04 DIAGNOSIS — R0789 Other chest pain: Secondary | ICD-10-CM | POA: Diagnosis not present

## 2024-08-04 LAB — CBC
HCT: 40.6 % (ref 36.0–46.0)
Hemoglobin: 13.4 g/dL (ref 12.0–15.0)
MCH: 28.8 pg (ref 26.0–34.0)
MCHC: 33 g/dL (ref 30.0–36.0)
MCV: 87.3 fL (ref 80.0–100.0)
Platelets: 349 K/uL (ref 150–400)
RBC: 4.65 MIL/uL (ref 3.87–5.11)
RDW: 14.7 % (ref 11.5–15.5)
WBC: 9.8 K/uL (ref 4.0–10.5)
nRBC: 0 % (ref 0.0–0.2)

## 2024-08-04 LAB — BASIC METABOLIC PANEL WITH GFR
Anion gap: 13 (ref 5–15)
BUN: 25 mg/dL — ABNORMAL HIGH (ref 8–23)
CO2: 26 mmol/L (ref 22–32)
Calcium: 8.5 mg/dL — ABNORMAL LOW (ref 8.9–10.3)
Chloride: 104 mmol/L (ref 98–111)
Creatinine, Ser: 1.27 mg/dL — ABNORMAL HIGH (ref 0.44–1.00)
GFR, Estimated: 43 mL/min — ABNORMAL LOW (ref >60)
Glucose, Bld: 105 mg/dL — ABNORMAL HIGH (ref 70–99)
Potassium: 4.2 mmol/L (ref 3.5–5.1)
Sodium: 142 mmol/L (ref 135–145)

## 2024-08-04 LAB — TROPONIN T, HIGH SENSITIVITY: Troponin T High Sensitivity: <15 ng/L (ref 0–19)

## 2024-08-04 NOTE — Patient Instructions (Signed)
 We are referring you to Dr. Camella for your right wrist [address], [place], [place] [address] Phone: [phone]  They will call you to schedule within a week. If you don't hear from them please let us  know.

## 2024-08-04 NOTE — Progress Notes (Signed)
 DATE OF VISIT: 08/04/2024        [name] DOB: [birth year] MRN: [id number]  CC: Right wrist swelling  History of present Illness: [name] is a 78 y.o. female who presents for evaluation of right wrist swelling.  Patient notes that for the past 4 to 5 months she has had a area of swelling on her wrist.  She notes that the area of concern was initially tender, however is no longer tender to palpation.  She reports burning pain in the right radial forearm and thumb.  She denies any mechanical symptoms.  She denies any injuries to the wrist.  Patient denies any numbness, tingling, or weakness associated with the lesion.  She feels like area of swelling is getting larger.  Seen by outside orthopedist with [facility] - Dr Marsa Christen MD - Last seen by them 06/08/2024 - Visit notes were reviewed in detail during the appointment today - Volar ganglion cyst suspected, ultrasound was ordered - Ultrasound completed 07/01/2024 showing probable 7 mm ganglion cyst with small adjacent traversing vessel noted.  No other abnormalities  Patient reports did not receive ultrasound results from orthopedist She needed to follow-up with her PCP office on 07/23/2024 to receive results - She was referred to us  after that visit for further evaluation  Medications:  Outpatient Encounter Medications as of 08/04/2024  Medication Sig   albuterol  (VENTOLIN  HFA) 108 (90 Base) MCG/ACT inhaler Inhale 2 puffs into the lungs every 6 (six) hours as needed for wheezing or shortness of breath.   amLODipine  (NORVASC ) 10 MG tablet TAKE 1 TABLET BY MOUTH DAILY   aspirin  EC 81 MG tablet Take 81 mg by mouth daily.   B Complex-C (B-COMPLEX WITH VITAMIN C) tablet Take 1 tablet by mouth daily.   Bempedoic Acid  (NEXLETOL ) 180 MG TABS Take 1 tablet (180 mg total) by mouth daily.   bisoprolol  (ZEBETA ) 5 MG tablet Take 0.5 tablets (2.5 mg total) by mouth daily.   calcitRIOL  (ROCALTROL ) 0.25 MCG capsule TAKE 3 CAPSULES  BY MOUTH DAILY   calcium  carbonate (TUMS) 500 MG chewable tablet Chew 2 tablets (400 mg of elemental calcium  total) by mouth 2 (two) times daily.   Cholecalciferol  (VITAMIN D ) 125 MCG (5000 UT) CAPS Take 5,000 Units by mouth daily in the afternoon.   ciclopirox  (PENLAC ) 8 % solution Apply topically at bedtime. Apply over nail and surrounding skin. Apply daily over previous coat. After seven (7) days, may remove with alcohol and continue cycle.   clopidogrel  (PLAVIX ) 75 MG tablet TAKE 1 TABLET BY MOUTH DAILY   conjugated estrogens  (PREMARIN ) vaginal cream Apply small amount to vaginal mucosa twice weekly   furosemide  (LASIX ) 20 MG tablet TAKE 1 TABLET BY MOUTH ONCE  WEEKLY AS NEEDED   hydrALAZINE  (APRESOLINE ) 10 MG tablet TAKE 1/2 TABLET (5 MG) BY MOUTH AS NEEDED FOR BP ABOVE 160 OR GREATER   hydrocortisone  (ANUSOL -HC) 2.5 % rectal cream Place 1 Application rectally 2 (two) times daily.   isosorbide  mononitrate (IMDUR ) 60 MG 24 hr tablet TAKE 1 TABLET BY MOUTH DAILY   levothyroxine  (SYNTHROID ) 112 MCG tablet Take 1 tablet (112 mcg total) by mouth 3 (three) times a week.   Multiple Vitamin (MULTIVITAMIN WITH MINERALS) TABS tablet Take 1 tablet by mouth daily.   nitroGLYCERIN  (NITROSTAT ) 0.4 MG SL tablet DISSOLVE 1 TABLET UNDER THE  TONGUE EVERY 5 MINUTES AS NEEDED FOR CHEST PAIN. MAX OF 3 TABLETS IN 15 MINUTES. CALL 911 IF PAIN  PERSISTS.  pantoprazole  (PROTONIX ) 40 MG tablet TAKE 1 TABLET BY MOUTH DAILY   REPATHA  SURECLICK 140 MG/ML SOAJ INJECT 140MG  SUBCUTANEOUSLY  EVERY 2 WEEKS   rosuvastatin  (CRESTOR ) 20 MG tablet Take 1 tablet (20 mg total) by mouth daily.   urea  (CARMOL) 10 % cream Apply topically as needed.   No facility-administered encounter medications on file as of 08/04/2024.    Allergies: is allergic to zetia  [ezetimibe ] and penicillin g.  Physical Examination: Vitals: BP (!) 125/50   Ht 5' 5 (1.651 m)   Wt 190 lb (86.2 kg)   BMI 31.62 kg/m  GENERAL:  [name] is a 78  y.o. female appearing their stated age, alert and oriented x 3, in no apparent distress.  SKIN: no rashes or lesions, skin clean, dry, intact MSK: Right wrist: Nodular area of swelling with fluctuance on the volar and radial aspect of the wrist without overlying skin changes.  No increased redness or warmth.  Slight tenderness over the swollen area.  Tenderness to palpation at right first [facility].  Intact active and passive range of motion in all planes.  5/5 strength in radial, median, and ulnar nerve distributions.  Negative grind test of the right first CMC joint. Left wrist: No visible swelling or abnormalities.  Full range of motion without pain. NEURO: sensation intact to light touch upper extremity bilaterally VASC: pulses 2+ and symmetric radial artery bilaterally, no edema  Radiology: Limited right upper extremity ultrasound for evaluation of right hand mass 07/01/2024 showing:  - Probable 7 mm ganglion cyst within the superficial soft tissues of the right hand/wrist.  Assessment & Plan Ganglion cyst of volar aspect of right wrist Patient presents for evaluation of nodular fluctuance swelling to the right volar radial wrist for several months.  She feels it has been increasing in size.  Patient additionally reporting burning sensation to the right radial forearm and thumb which may reflect compression neuropathy secondary to compression from cystic structure.    Plan:  - Prior visit notes with orthopedic surgeon, PCP and imaging reviewed as noted above - Discussed treatment options with patient including aspiration, conservative therapies, and surgical interventions.  Given the location of the cyst along the volar aspect of the wrist, and close approximation to neurovascular structures as seen on limited ultrasound, patient should be evaluated by an orthopedic hand surgeon.  She would likely benefit from surgical intervention.  Patient most interested in surgical removal due to risk of potential  recurrence with injection/aspiration. Will provide referral to orthopedic hand surgeon for surgical evaluation. -She will follow-up with me on an as-needed basis  Patient seen and examined with Robert Gallivan, MD PGY3  Patient expressed understanding & agreement with above.  Encounter Diagnosis  Name Primary?   Ganglion cyst of volar aspect of right wrist Yes    No orders of the defined types were placed in this encounter.

## 2024-08-04 NOTE — Discharge Instructions (Signed)
 Try pepcid  or tagamet up to twice a day.  Try to avoid things that may make this worse, most commonly these are spicy foods tomato based products fatty foods chocolate and peppermint.  Alcohol and tobacco can also make this worse.  Return to the emergency department for sudden worsening pain fever or inability to eat or drink.

## 2024-08-04 NOTE — ED Triage Notes (Signed)
 Pt states she began having CP approx 1 hr ago.   No radiation Denies N/V or SHOB Reports MI 10 yrs ago

## 2024-08-05 NOTE — ED Provider Notes (Signed)
 [facility] Provider Note   CSN: [id number] Arrival date & time: 08/04/24  2115     Patient presents with: Chest Pain   [name] is a 78 y.o. female.   78 yo F with a chief complaint of chest pain.  This is left-sided and feels a bit like a throbbing.  Occurred while she was watching TV and eating ice cream.  Lasted for maybe an hour and resolved.  No difficulty breathing no nausea no diaphoresis.  When it occurred nothing seem to make it better or worse.  She has a remote history of an MI.  Symptoms have now resolved.   Chest Pain      Prior to Admission medications   Medication Sig Start Date End Date Taking? Authorizing Provider  albuterol  (VENTOLIN  HFA) 108 (90 Base) MCG/ACT inhaler Inhale 2 puffs into the lungs every 6 (six) hours as needed for wheezing or shortness of breath. 01/07/24   O'Sullivan, Melissa, NP  amLODipine  (NORVASC ) 10 MG tablet TAKE 1 TABLET BY MOUTH DAILY 05/20/24   Okey Vina GAILS, MD  aspirin  EC 81 MG tablet Take 81 mg by mouth daily. 11/22/11   Dunn, Dayna N, PA-C  B Complex-C (B-COMPLEX WITH VITAMIN C) tablet Take 1 tablet by mouth daily.    [provider]  Bempedoic Acid  (NEXLETOL ) 180 MG TABS Take 1 tablet (180 mg total) by mouth daily. 12/19/23   Okey Vina GAILS, MD  bisoprolol  (ZEBETA ) 5 MG tablet Take 0.5 tablets (2.5 mg total) by mouth daily. 11/12/23   Okey Vina GAILS, MD  calcitRIOL  (ROCALTROL ) 0.25 MCG capsule TAKE 3 CAPSULES BY MOUTH DAILY 08/20/23   Okey Vina GAILS, MD  calcium  carbonate (TUMS) 500 MG chewable tablet Chew 2 tablets (400 mg of elemental calcium  total) by mouth 2 (two) times daily. 01/15/24   O'Sullivan, Melissa, NP  Cholecalciferol  (VITAMIN D ) 125 MCG (5000 UT) CAPS Take 5,000 Units by mouth daily in the afternoon. 01/17/23   Okey Vina GAILS, MD  ciclopirox  (PENLAC ) 8 % solution Apply topically at bedtime. Apply over nail and surrounding skin. Apply daily over previous coat. After  seven (7) days, may remove with alcohol and continue cycle. 06/12/23   Almarie Waddell NOVAK, NP  clopidogrel  (PLAVIX ) 75 MG tablet TAKE 1 TABLET BY MOUTH DAILY 05/20/24   Ross, Paula V, MD  conjugated estrogens  (PREMARIN ) vaginal cream Apply small amount to vaginal mucosa twice weekly 04/07/24   Domenica Harlene LABOR, MD  furosemide  (LASIX ) 20 MG tablet TAKE 1 TABLET BY MOUTH ONCE  WEEKLY AS NEEDED 05/19/24   Domenica Harlene LABOR, MD  hydrALAZINE  (APRESOLINE ) 10 MG tablet TAKE 1/2 TABLET (5 MG) BY MOUTH AS NEEDED FOR BP ABOVE 160 OR GREATER 04/02/23   Okey Vina GAILS, MD  hydrocortisone  (ANUSOL -HC) 2.5 % rectal cream Place 1 Application rectally 2 (two) times daily. 11/14/23   Kerman Vina HERO, NP  isosorbide  mononitrate (IMDUR ) 60 MG 24 hr tablet TAKE 1 TABLET BY MOUTH DAILY 04/27/24   Okey Vina GAILS, MD  levothyroxine  (SYNTHROID ) 112 MCG tablet Take 1 tablet (112 mcg total) by mouth 3 (three) times a week. 12/11/23   O'Sullivan, Melissa, NP  Multiple Vitamin (MULTIVITAMIN WITH MINERALS) TABS tablet Take 1 tablet by mouth daily.    [provider]  nitroGLYCERIN  (NITROSTAT ) 0.4 MG SL tablet DISSOLVE 1 TABLET UNDER THE  TONGUE EVERY 5 MINUTES AS NEEDED FOR CHEST PAIN. MAX OF 3 TABLETS IN 15 MINUTES. CALL 911  IF PAIN  PERSISTS. 05/20/24   Okey Vina GAILS, MD  pantoprazole  (PROTONIX ) 40 MG tablet TAKE 1 TABLET BY MOUTH DAILY 03/12/24   Domenica Harlene LABOR, MD  REPATHA  SURECLICK 140 MG/ML SOAJ INJECT 140MG  SUBCUTANEOUSLY  EVERY 2 WEEKS 01/20/24   Okey Vina GAILS, MD  rosuvastatin  (CRESTOR ) 20 MG tablet Take 1 tablet (20 mg total) by mouth daily. 12/19/23 06/15/24  Okey Vina GAILS, MD  urea  (CARMOL) 10 % cream Apply topically as needed. 12/27/22   Standiford, Marsa FALCON, DPM    Allergies: Zetia  [ezetimibe ] and Penicillin g    Review of Systems  Cardiovascular:  Positive for chest pain.    Updated Vital Signs BP 135/80   Pulse (!) 59   Temp (!) 97.4 F (36.3 C) (Oral)   Resp 18   Ht 5' 5 (1.651 m)   Wt 86.2 kg   SpO2 96%    BMI 31.62 kg/m   Physical Exam Vitals and nursing note reviewed.  Constitutional:      General: She is not in acute distress.    Appearance: She is well-developed. She is not diaphoretic.  HENT:     Head: Normocephalic and atraumatic.  Eyes:     Pupils: Pupils are equal, round, and reactive to light.  Cardiovascular:     Rate and Rhythm: Normal rate and regular rhythm.     Heart sounds: No murmur heard.    No friction rub. No gallop.  Pulmonary:     Effort: Pulmonary effort is normal.     Breath sounds: No wheezing or rales.  Chest:     Chest wall: No tenderness.     Comments: No rash Abdominal:     General: There is no distension.     Palpations: Abdomen is soft.     Tenderness: There is no abdominal tenderness.  Musculoskeletal:        General: No tenderness.     Cervical back: Normal range of motion and neck supple.  Skin:    General: Skin is warm and dry.  Neurological:     Mental Status: She is alert and oriented to person, place, and time.  Psychiatric:        Behavior: Behavior normal.     (all labs ordered are listed, but only abnormal results are displayed) Labs Reviewed  BASIC METABOLIC PANEL WITH GFR - Abnormal; Notable for the following components:      Result Value   Glucose, Bld 105 (*)    BUN 25 (*)    Creatinine, Ser 1.27 (*)    Calcium  8.5 (*)    GFR, Estimated 43 (*)    All other components within normal limits  CBC  TROPONIN T, HIGH SENSITIVITY  TROPONIN T, HIGH SENSITIVITY    EKG: EKG Interpretation Date/Time:  Tuesday August 04 2024 21:29:55 EDT Ventricular Rate:  62 PR Interval:  193 QRS Duration:  143 QT Interval:  472 QTC Calculation: 480 R Axis:   37  Text Interpretation: Sinus rhythm Right bundle branch block No significant change since last tracing Confirmed by Emil Share [id number] on 08/04/2024 11:03:31 PM  Radiology: [facility] Chest 2 View Result Date: 08/04/2024 CLINICAL DATA:  Chest pain. EXAM: CHEST - 2 VIEW COMPARISON:  June 11, 2024 FINDINGS: The heart size and mediastinal contours are within normal limits. Mild calcification of the aortic arch is seen. Both lungs are clear. The visualized skeletal structures are unremarkable. IMPRESSION: No active cardiopulmonary disease. Electronically Signed   By: Suzen Dials  M.D.   On: 08/04/2024 22:24     Procedures   Medications Ordered in the ED - No data to display                                  Medical Decision Making Amount and/or Complexity of Data Reviewed Labs: ordered. Radiology: ordered.   78 yo F with a chief complaints of left-sided chest discomfort.  Left-sided throbbing lasted for about an hour.  Occurred at rest and resolved spontaneously.  Initial troponin negative.  No anemia, no leukocytosis, renal function mildly worse than baseline.  Chest x-ray independently interpreted by me without focal infiltrate or pneumothorax.  EKG without obvious ischemia.  I discussed result with the patient and family.  As her symptoms started just before arrival to the ED I did discuss benefits of second troponin.  Patient is declining.  Feels better.  Would prefer to follow-up with her family doctor in the office.  I encouraged her to have her renal function rechecked in a week or 2.  Encouraged her to increase her fluid intake.  12:36 AM:  I have discussed the diagnosis/risks/treatment options with the patient and family.  Evaluation and diagnostic testing in the emergency department does not suggest an emergent condition requiring admission or immediate intervention beyond what has been performed at this time.  They will follow up with PCP. We also discussed returning to the ED immediately if new or worsening sx occur. We discussed the sx which are most concerning (e.g., sudden worsening pain, fever, inability to tolerate by mouth, exertional symptoms, hemoptysis, syncope) that necessitate immediate return. Medications administered to the patient during their  visit and any new prescriptions provided to the patient are listed below.  Medications given during this visit Medications - No data to display   The patient appears reasonably screen and/or stabilized for discharge and I doubt any other medical condition or other [facility] requiring further screening, evaluation, or treatment in the ED at this time prior to discharge.       Final diagnoses:  Nonspecific chest pain    ED Discharge Orders     None          Emil Share, DO 08/05/24 0036

## 2024-08-05 NOTE — Addendum Note (Signed)
 Addended by: TERESSA RAINELL BROCKS on: 08/05/2024 10:37 AM   Modules accepted: Level of Service

## 2024-08-20 ENCOUNTER — Other Ambulatory Visit: Payer: Self-pay | Admitting: Internal Medicine

## 2024-08-20 MED ORDER — ROSUVASTATIN CALCIUM 20 MG PO TABS: 20.0000 mg | ORAL_TABLET | Freq: Every day | ORAL | 2 refills | Status: DC

## 2024-08-27 ENCOUNTER — Other Ambulatory Visit: Payer: Self-pay | Admitting: Internal Medicine

## 2024-08-27 NOTE — Progress Notes (Unsigned)
 Cardiology Office Note   Date:  08/27/2024   ID:  [name], DOB Aug 08, 1946, MRN [id number]  PCP:  Domenica Harlene LABOR, MD  Cardiologist:   Vina Gull, MD   F/U of CAD    History of Present Illness:  [name] is a 78 y.o. female with hx of CAD, CP, HTN, HL, GERD, vertigo  2012 - s/p NSTEMI Underwent  DESx2 RCA    2019  LHC   LAD 25%  OM 100% with collaterals  RCA with patent stent   LVEF normal   Sept 2020  Echo showed  LVEF and RVEF were normal   Mild diastolic dysfunction      2023 Pt had some numbness on L face   Seen by  neuro, ultimately felt not to represent a TIA  Echo  Normal   Carotid USN without severe dz   Symptoms felt to be due to DJD of neck     April 2024  Seen in ER for CP/shoulder pain  BP severely elevated   Symptoms eased with NTG  Nov 2024   Event monitor showed SR Average HR 56 bpm   Cut bisoprolol  to 2.5   Fall 2024 patient has been to ER twice for CP  First visit was on 10/23   She was at home   Had dull pain in chest   BP was very high   180   Took 2 ASA and an NTG.   BP stayed high  Went to ER   There it wa in the 150s  Trops negative   Pain eased off Second visit was on 10/29   She was in bed when she developed a knife like pain in cest   L parasternal   Took ASA and NTG  Pain came back x 3  Brief  Not pleuritic Went to ER  BP 129/  CTA negative  Sent home  I last saw the pt in May 2025    Allergies:   Zetia  [ezetimibe ] and Penicillin g   Past Medical History:  Diagnosis Date   Acute MI, subendocardial (HCC)    Arthritis    Back pain 05/31/2013   Benign paroxysmal positional vertigo 08/05/2016   CAD (coronary artery disease)    a. NSTEMI 10/2011: LHC 11/19/11: pLAD 30%, oOM 60%, AVCFX 30%, CFX after OM2 30%, pRCA 60 and 70%, then 99%, AM 80-90% with TIMI 3 flow.  PCI: Promus DES x 2 to RCA; b. 06/2012 Cath: patent RCA stents w/ subtl occl of Acute Marginal (jailed)->Med rx; c. 05/2015 MV: EF 59%, mod mid infsept/inf/ap lat/ap infarct with  peri-infarct isch-->Med Rx; d. 02/2016 Cath: LM nl, LAD 69m, RI 50, RCA patent stents.   Colitis    Facial skin lesion 01/28/2017   GERD (gastroesophageal reflux disease)    occasional   Hyperlipidemia    Hypertension    Hypertensive heart disease    a. Echocardiogram 11/19/11: Difficult acoustic windows, EF 60-65%, normal LV wall thickness, grade 1 diastolic dysfunction   Hypoparathyroidism (HCC)    Low zinc  level 11/26/2016   Multinodular thyroid     Goiter s/p thyroidectomy in 2007 with post-op  hypocalcemia and post-op hypothyroidism/hypoparathyroidism with hypocalcemia   Neck pain on right side 07/13/2013   Nocturia 05/31/2013   Osteoarthritis    Pain in right axilla 03/13/2016   Palpitations    a. 03/2012 - patient set up for event monitor but did not wear correctly -  she declined wearing a  repeat monitor   Post-surgical hypothyroidism    Sun-damaged skin 12/05/2014   Tubular adenoma of colon 06/2011   Unspecified constipation 05/31/2013   Vertigo    Zinc  deficiency 11/26/2015    Past Surgical History:  Procedure Laterality Date   CARDIAC CATHETERIZATION N/A 03/08/2016   Procedure: Left Heart Cath and Coronary Angiography;  Surgeon: Candyce GORMAN Reek, MD;  Location: [facility] INVASIVE CV LAB;  Service: Cardiovascular;  Laterality: N/A;   COLONOSCOPY  Aenomatous polyps   07/05/2011   COLONOSCOPY N/A 09/28/2014   Procedure: COLONOSCOPY;  Surgeon: Gwendlyn ONEIDA Buddy, MD;  Location: [facility] ENDOSCOPY;  Service: Endoscopy;  Laterality: N/A;   CORONARY ANGIOPLASTY WITH STENT PLACEMENT     LEFT HEART CATH AND CORONARY ANGIOGRAPHY N/A 12/09/2018   Procedure: LEFT HEART CATH AND CORONARY ANGIOGRAPHY;  Surgeon: Reek Candyce GORMAN, MD;  Location: [facility] INVASIVE CV LAB;  Service: Cardiovascular;  Laterality: N/A;   LEFT HEART CATHETERIZATION WITH CORONARY ANGIOGRAM N/A 07/17/2012   Procedure: LEFT HEART CATHETERIZATION WITH CORONARY ANGIOGRAM;  Surgeon: Debby JONETTA Como, MD;  Location: [facility] CATH LAB;  Service:  Cardiovascular;  Laterality: N/A;   PERCUTANEOUS CORONARY STENT INTERVENTION (PCI-S) N/A 11/19/2011   Procedure: PERCUTANEOUS CORONARY STENT INTERVENTION (PCI-S);  Surgeon: Debby JONETTA Como, MD;  Location: [facility] CATH LAB;  Service: Cardiovascular;  Laterality: N/A;   POLYPECTOMY     SHOULDER ARTHROSCOPY W/ ROTATOR CUFF REPAIR     right   TOTAL THYROIDECTOMY  2007   GOITER     Social History:  The patient  reports that she has never smoked. She has never used smokeless tobacco. She reports that she does not currently use alcohol. She reports that she does not use drugs.   Family History:  The patient's family history includes Blindness in her sister; Cancer in her brother and brother; Colon cancer in her brother; Congestive Heart Failure in her sister; Healthy in her daughter, daughter, daughter, and son; Heart attack in her father; Heart disease in her father; Hypertension in her father and sister; Thyroid  disease in her sister.    ROS:  Please see the history of present illness. All other systems are reviewed and  Negative to the above problem except as noted.    PHYSICAL EXAM: VS:  There were no vitals taken for this visit.  GEN: Obese 78 yo  in no acute distress  HEENT: normal  Neck: JVP is not elevated   No bruit Cardiac: RRR; no murmur  No LE  edema  Respiratory:  clear to auscultation  GI: soft, nontender,no masses, no hepatomegaly   EKG:  EKG is sinus rhythm 57 bpm  RBBB   Echo   Feb 2024   1. Left ventricular ejection fraction, by estimation, is 60 to 65%. The  left ventricle has normal function. The left ventricle has no regional  wall motion abnormalities. Left ventricular diastolic parameters are  consistent with Grade I diastolic  dysfunction (impaired relaxation).   2. Right ventricular systolic function is normal. The right ventricular  size is normal. There is normal pulmonary artery systolic pressure. The  estimated right ventricular systolic pressure is 24.5  mmHg.   3. The mitral valve is normal in structure. No evidence of mitral valve  regurgitation. No evidence of mitral stenosis.   4. The aortic valve is tricuspid. Aortic valve regurgitation is trivial.  Aortic valve sclerosis is present, with no evidence of aortic valve  stenosis. Aortic valve area, by VTI measures 2.64 cm. Aortic valve mean  gradient  measures 4.0 mmHg. Aortic  valve Vmax measures 1.33 m/s.   5. The inferior vena cava is dilated in size with >50% respiratory  variability, suggesting right atrial pressure of 8 mmHg.   Conclusion(s)/Recommendation(s): No intracardiac source of embolism  detected on this transthoracic study. Consider a transesophageal  echocardiogram to exclude cardiac source of embolism if clinically  indicated.   Echo   Sept 2023   1. Left ventricular ejection fraction, by estimation, is 60 to 65%. The  left ventricle has normal function. The left ventricle has no regional  wall motion abnormalities. There is mild left ventricular hypertrophy.  Left ventricular diastolic parameters  were normal.   2. Right ventricular systolic function is normal. The right ventricular  size is normal.   3. The mitral valve is abnormal. Trivial mitral valve regurgitation. No  evidence of mitral stenosis.   4. The aortic valve is tricuspid. Aortic valve regurgitation is trivial.  No aortic stenosis is present.   5. The inferior vena cava is normal in size with greater than 50%  respiratory variability, suggesting right atrial pressure of 3 mmHg.   LHC   Dec 2019   Previously placed Prox RCA to Dist RCA stent (unknown type) is widely patent. Acute Mrg lesion is 100% stenosed. Left to right collaterals. Ost Ramus lesion is 40% stenosed. Prox LAD lesion is 25% stenosed. Ost 1st Diag lesion is 25% stenosed. The left ventricular systolic function is normal. LV end diastolic pressure is normal. The left ventricular ejection fraction is 55-65% by visual  estimate. There is no aortic valve stenosis.   No change in coronary anatomy from 2017.  Continue medical therapy.      Lipid Panel    Component Value Date/Time   CHOL 158 06/15/2024 1123   CHOL 172 10/11/2022 1050   TRIG 152.0 (H) 06/15/2024 1123   HDL 44.00 06/15/2024 1123   HDL 43 10/11/2022 1050   CHOLHDL 4 06/15/2024 1123   VLDL 30.4 06/15/2024 1123   LDLCALC 84 06/15/2024 1123   LDLCALC 101 (H) 10/11/2022 1050   LDLDIRECT 96.0 11/06/2022 1623      Wt Readings from Last 3 Encounters:  08/04/24 190 lb (86.2 kg)  08/04/24 190 lb (86.2 kg)  07/23/24 191 lb 3.2 oz (86.7 kg)      ASSESSMENT AND PLAN:  1 CAD   s/p remote stent to RCA     Last cath in 2019 showed 100% OM   Patent stents to RCA  Unchanged from previous cath in 2017 PT has had chest pains since but not felt to be cardiac    She does get anxious  Follow   Manage risk factes  2  HL   Will repeat NMR panel   COntinue Repatha , nexletol , Crestor   May be able to cut back  3  HTN  BP is controlled  Continue current meds   4 Hx hx of hypocalcemia  PT on Ca supplements     Follow up in 4 months     Current medicines are reviewed at length with the patient today.  The patient does not have concerns regarding medicines.  Signed, Vina Gull, MD  08/27/2024 5:11 PM    [facility] [address] [place], [place], [place]  [address] Phone: [phone]; Fax: [phone]

## 2024-08-28 ENCOUNTER — Encounter: Payer: Self-pay | Admitting: Internal Medicine

## 2024-08-28 ENCOUNTER — Ambulatory Visit: Attending: Internal Medicine | Admitting: Internal Medicine

## 2024-08-28 VITALS — BP 130/64 | HR 57 | Ht 65.0 in | Wt 193.2 lb

## 2024-08-28 DIAGNOSIS — I10 Essential (primary) hypertension: Secondary | ICD-10-CM

## 2024-08-28 DIAGNOSIS — E785 Hyperlipidemia, unspecified: Secondary | ICD-10-CM

## 2024-08-28 DIAGNOSIS — E559 Vitamin D deficiency, unspecified: Secondary | ICD-10-CM

## 2024-08-28 NOTE — Patient Instructions (Signed)
 Medication Instructions:  Your physician recommends that you continue on your current medications as directed. Please refer to the Current Medication list given to you today.  *If you need a refill on your cardiac medications before your next appointment, please call your pharmacy*  Lab Work: TODAY: CMET, NMR, vitamin D  If you have labs (blood work) drawn today and your tests are completely normal, you will receive your results only by: MyChart Message (if you have MyChart) OR A paper copy in the mail If you have any lab test that is abnormal or we need to change your treatment, we will call you to review the results.  Follow-Up: At [facility], you and your health needs are our priority.  As part of our continuing mission to provide you with exceptional heart care, our providers are all part of one team.  This team includes your primary Cardiologist (physician) and Advanced Practice Providers or APPs (Physician Assistants and Nurse Practitioners) who all work together to provide you with the care you need, when you need it.  Your next appointment:   4 month(s)  Provider:   Vina Gull, MD

## 2024-08-31 ENCOUNTER — Ambulatory Visit: Payer: Self-pay | Admitting: Internal Medicine

## 2024-08-31 DIAGNOSIS — E559 Vitamin D deficiency, unspecified: Secondary | ICD-10-CM

## 2024-08-31 DIAGNOSIS — E785 Hyperlipidemia, unspecified: Secondary | ICD-10-CM

## 2024-08-31 DIAGNOSIS — Z79899 Other long term (current) drug therapy: Secondary | ICD-10-CM

## 2024-09-01 NOTE — Telephone Encounter (Signed)
 Patient not on Nexletol . She says she never got it (was rx 12/17/23). Although labs in Feb were good and then went up again. Fill hx is consistent with her not taking. Fill hx does show she is taking Reaptha and rosuvastatin . Patient sounded open to taking Nexletol . W/ intolerance to zetia , this is pretty much her only option.Luckily particle # improved.

## 2024-09-02 DIAGNOSIS — M67431 Ganglion, right wrist: Secondary | ICD-10-CM | POA: Diagnosis not present

## 2024-09-02 DIAGNOSIS — M1811 Unilateral primary osteoarthritis of first carpometacarpal joint, right hand: Secondary | ICD-10-CM | POA: Diagnosis not present

## 2024-09-03 MED ORDER — NEXLETOL 180 MG PO TABS: 1.0000 | ORAL_TABLET | Freq: Every day | ORAL | 11 refills | Status: AC

## 2024-09-05 LAB — COMPREHENSIVE METABOLIC PANEL WITH GFR
ALT: 17 IU/L (ref 0–32)
AST: 21 IU/L (ref 0–40)
Albumin: 4.1 g/dL (ref 3.8–4.8)
Alkaline Phosphatase: 92 IU/L (ref 44–121)
BUN/Creatinine Ratio: 18 (ref 12–28)
BUN: 23 mg/dL (ref 8–27)
Bilirubin Total: 0.4 mg/dL (ref 0.0–1.2)
CO2: 24 mmol/L (ref 20–29)
Calcium: 9.9 mg/dL (ref 8.7–10.3)
Chloride: 100 mmol/L (ref 96–106)
Creatinine, Ser: 1.29 mg/dL — ABNORMAL HIGH (ref 0.57–1.00)
Globulin, Total: 3 g/dL (ref 1.5–4.5)
Glucose: 87 mg/dL (ref 70–99)
Potassium: 4.6 mmol/L (ref 3.5–5.2)
Sodium: 141 mmol/L (ref 134–144)
Total Protein: 7.1 g/dL (ref 6.0–8.5)
eGFR: 43 mL/min/1.73 — ABNORMAL LOW (ref >59)

## 2024-09-05 LAB — NMR, LIPOPROFILE
Cholesterol, Total: 183 mg/dL (ref 100–199)
HDL Particle Number: 34.9 umol/L (ref >=30.5)
HDL-C: 49 mg/dL (ref >39)
LDL Particle Number: 1286 nmol/L — ABNORMAL HIGH (ref <1000)
LDL Size: 20.9 nm (ref >20.5)
LDL-C (NIH Calc): 97 mg/dL (ref 0–99)
LP-IR Score: 70 — ABNORMAL HIGH (ref <=45)
Small LDL Particle Number: 627 nmol/L — ABNORMAL HIGH (ref <=527)
Triglycerides: 216 mg/dL — ABNORMAL HIGH (ref 0–149)

## 2024-09-05 LAB — VITAMIN D 1,25 DIHYDROXY
Vitamin D 1, 25 (OH)2 Total: 34 pg/mL
Vitamin D2 1, 25 (OH)2: <10 pg/mL
Vitamin D3 1, 25 (OH)2: 31 pg/mL

## 2024-09-11 MED ORDER — VITAMIN D (ERGOCALCIFEROL) 1.25 MG (50000 UNIT) PO CAPS: 50000.0000 [IU] | ORAL_CAPSULE | ORAL | 2 refills | Status: DC

## 2024-09-11 MED ORDER — ROSUVASTATIN CALCIUM 20 MG PO TABS: 20.0000 mg | ORAL_TABLET | Freq: Every day | ORAL | 2 refills | Status: DC

## 2024-09-15 ENCOUNTER — Telehealth: Payer: Self-pay

## 2024-09-15 ENCOUNTER — Other Ambulatory Visit (HOSPITAL_COMMUNITY): Payer: Self-pay

## 2024-09-15 NOTE — Telephone Encounter (Signed)
-----   Message from [name] sent at 09/14/2024  8:23 PM EDT ----- Please do PA for nexletol

## 2024-09-15 NOTE — Telephone Encounter (Signed)
 Pharmacy Patient Advocate Encounter   Received notification from Physician's Office that prior authorization for NEXLETOL  is required/requested.   Insurance verification completed.   The patient is insured through [employer] .   Per test claim: Refill too soon. PA is not needed at this time. Medication was filled 09/03/24. Next eligible fill date is 09/26/24.

## 2024-09-29 ENCOUNTER — Other Ambulatory Visit: Payer: Self-pay | Admitting: Internal Medicine

## 2024-10-11 NOTE — Assessment & Plan Note (Signed)
 Encouraged increased hydration, 64 ounces of clear fluids daily. Minimize alcohol and caffeine. Eat small frequent meals with lean proteins and complex carbs. Avoid high and low blood sugars. Get adequate sleep, 7-8 hours a night. Needs exercise daily preferably in the morning.

## 2024-10-11 NOTE — Assessment & Plan Note (Signed)
 Encourage heart healthy diet such as MIND or DASH diet, increase exercise, avoid trans fats, simple carbohydrates and processed foods, consider a krill or fish or flaxseed oil cap daily. Tolerating Rosuvastatin

## 2024-10-11 NOTE — Assessment & Plan Note (Signed)
 On Levothyroxine, continue to monitor

## 2024-10-11 NOTE — Assessment & Plan Note (Signed)
 Supplement and monitor

## 2024-10-11 NOTE — Progress Notes (Unsigned)
 Subjective:    Patient ID: [name], female    DOB: 1946/12/01, 78 y.o.   MRN: [id number]  No chief complaint on file.   HPI Discussed the use of AI scribe software for clinical note transcription with the patient, who gave verbal consent to proceed.  History of Present Illness [name] is a 78 year old female who presents for a follow-up visit.  Her blood pressure readings at home are usually around 130 mmHg, but recently spiked to 160 mmHg. She took hydralazine , which was given to her for high readings, and her blood pressure stabilized. Her current medications include amlodipine , bisoprolol , and hydralazine , which she takes when her blood pressure is high. No recent stress or traffic issues affecting her blood pressure.  She is currently taking levothyroxine  112 mcg daily, although her prescription indicated three times a week. She noticed the discrepancy when she found her bottle empty and read the instructions. Her last thyroid  function test in June showed a TSH of 1.06.  She has a fungal infection in her fingernails and toenails. She uses Penlac  solution on her toenails and has started using it on her fingernails. She has tried home remedies such as soaking with baking soda, vinegar, and sea salt.  She experiences occasional itching around her genital area and uses hydrocortisone  cream for relief. She no longer uses estrogen cream.  She takes vitamin D  5000 IU daily and previously completed a course of high-dose vitamin D  weekly after her levels dropped to 20 ng/mL post-COVID. She also takes zinc , vitamin B complex, and is considering switching to a multivitamin with minerals.  She has a history of constipation, having bowel movements twice a week, and uses natural remedies. She drinks two liters of water daily.  She has a ganglion cyst on her wrist that recurs after aspiration and causes pain radiating up her arm.  She is concerned about her kidney function due to  occasional protein in her urine and nocturia, getting up twice a night to urinate.    Past Medical History:  Diagnosis Date  . Acute MI, subendocardial (HCC)   . Arthritis   . Back pain 05/31/2013  . Benign paroxysmal positional vertigo 08/05/2016  . CAD (coronary artery disease)    a. NSTEMI 10/2011: LHC 11/19/11: pLAD 30%, oOM 60%, AVCFX 30%, CFX after OM2 30%, pRCA 60 and 70%, then 99%, AM 80-90% with TIMI 3 flow.  PCI: Promus DES x 2 to RCA; b. 06/2012 Cath: patent RCA stents w/ subtl occl of Acute Marginal (jailed)->Med rx; c. 05/2015 MV: EF 59%, mod mid infsept/inf/ap lat/ap infarct with peri-infarct isch-->Med Rx; d. 02/2016 Cath: LM nl, LAD 4m, RI 50, RCA patent stents.  . Colitis   . Facial skin lesion 01/28/2017  . GERD (gastroesophageal reflux disease)    occasional  . Hyperlipidemia   . Hypertension   . Hypertensive heart disease    a. Echocardiogram 11/19/11: Difficult acoustic windows, EF 60-65%, normal LV wall thickness, grade 1 diastolic dysfunction  . Hypoparathyroidism   . Low zinc  level 11/26/2016  . Multinodular thyroid     Goiter s/p thyroidectomy in 2007 with post-op  hypocalcemia and post-op hypothyroidism/hypoparathyroidism with hypocalcemia  . Neck pain on right side 07/13/2013  . Nocturia 05/31/2013  . Osteoarthritis   . Pain in right axilla 03/13/2016  . Palpitations    a. 03/2012 - patient set up for event monitor but did not wear correctly -  she declined wearing a repeat monitor  .  Post-surgical hypothyroidism   . Sun-damaged skin 12/05/2014  . Tubular adenoma of colon 06/2011  . Unspecified constipation 05/31/2013  . Vertigo   . Zinc  deficiency 11/26/2015    Past Surgical History:  Procedure Laterality Date  . CARDIAC CATHETERIZATION N/A 03/08/2016   Procedure: Left Heart Cath and Coronary Angiography;  Surgeon: Candyce GORMAN Reek, MD;  Location: [facility] INVASIVE CV LAB;  Service: Cardiovascular;  Laterality: N/A;  . COLONOSCOPY  Aenomatous polyps   07/05/2011  .  COLONOSCOPY N/A 09/28/2014   Procedure: COLONOSCOPY;  Surgeon: Gwendlyn ONEIDA Buddy, MD;  Location: [facility] ENDOSCOPY;  Service: Endoscopy;  Laterality: N/A;  . CORONARY ANGIOPLASTY WITH STENT PLACEMENT    . LEFT HEART CATH AND CORONARY ANGIOGRAPHY N/A 12/09/2018   Procedure: LEFT HEART CATH AND CORONARY ANGIOGRAPHY;  Surgeon: Reek Candyce GORMAN, MD;  Location: [facility] INVASIVE CV LAB;  Service: Cardiovascular;  Laterality: N/A;  . LEFT HEART CATHETERIZATION WITH CORONARY ANGIOGRAM N/A 07/17/2012   Procedure: LEFT HEART CATHETERIZATION WITH CORONARY ANGIOGRAM;  Surgeon: Debby JONETTA Como, MD;  Location: [facility] CATH LAB;  Service: Cardiovascular;  Laterality: N/A;  . PERCUTANEOUS CORONARY STENT INTERVENTION (PCI-S) N/A 11/19/2011   Procedure: PERCUTANEOUS CORONARY STENT INTERVENTION (PCI-S);  Surgeon: Debby JONETTA Como, MD;  Location: [facility] CATH LAB;  Service: Cardiovascular;  Laterality: N/A;  . POLYPECTOMY    . SHOULDER ARTHROSCOPY W/ ROTATOR CUFF REPAIR     right  . TOTAL THYROIDECTOMY  2007   GOITER    Family History  Problem Relation Age of Onset  . Colon cancer Brother   . Cancer Brother        COLON  . Hypertension Father   . Heart disease Father   . Heart attack Father   . Blindness Sister   . Congestive Heart Failure Sister   . Hypertension Sister   . Healthy Daughter   . Healthy Son   . Thyroid  disease Sister   . Cancer Brother        multiple myelomas  . Healthy Daughter   . Healthy Daughter   . Diabetes Neg Hx   . Prostate cancer Neg Hx   . Breast cancer Neg Hx   . Esophageal cancer Neg Hx   . Rectal cancer Neg Hx   . Stomach cancer Neg Hx     Social History   Socioeconomic History  . Marital status: Married    Spouse name: Not on file  . Number of children: 4  . Years of education: 40  . Highest education level: Not on file  Occupational History  . Occupation: Sales person at [employer]  . Smoking status: Never  . Smokeless tobacco: Never  Vaping Use  . Vaping  status: Never Used  Substance and Sexual Activity  . Alcohol use: Not Currently  . Drug use: No  . Sexual activity: Not Currently  Other Topics Concern  . Not on file  Social History Narrative   Lives at home alone.  Her son lives near her.   Right-handed.   1 cup coffee per day.   Social Drivers of Corporate investment banker Strain: Low Risk  (07/14/2024)   Overall Financial Resource Strain (CARDIA)   . Difficulty of Paying Living Expenses: Not hard at all  Food Insecurity: No Food Insecurity (07/14/2024)   Hunger Vital Sign   . Worried About Programme researcher, broadcasting/film/video in the Last Year: Never true   . Ran Out of Food in the Last Year: Never true  Transportation Needs: No Transportation Needs (07/14/2024)   PRAPARE - Transportation   . Lack of Transportation (Medical): No   . Lack of Transportation (Non-Medical): No  Physical Activity: Inactive (07/14/2024)   Exercise Vital Sign   . Days of Exercise per Week: 0 days   . Minutes of Exercise per Session: 0 min  Stress: No Stress Concern Present (07/14/2024)   [employer] of Occupational Health - Occupational Stress Questionnaire   . Feeling of Stress: Not at all  Social Connections: Moderately Integrated (07/14/2024)   Social Connection and Isolation Panel   . Frequency of Communication with Friends and Family: More than three times a week   . Frequency of Social Gatherings with Friends and Family: More than three times a week   . Attends Religious Services: More than 4 times per year   . Active Member of Clubs or Organizations: Yes   . Attends Banker Meetings: More than 4 times per year   . Marital Status: Widowed  Intimate Partner Violence: Not At Risk (07/14/2024)   Humiliation, Afraid, Rape, and Kick questionnaire   . Fear of Current or Ex-Partner: No   . Emotionally Abused: No   . Physically Abused: No   . Sexually Abused: No    Outpatient Medications Prior to Visit  Medication Sig Dispense Refill  .  albuterol  (VENTOLIN  HFA) 108 (90 Base) MCG/ACT inhaler Inhale 2 puffs into the lungs every 6 (six) hours as needed for wheezing or shortness of breath. 8 g 0  . amLODipine  (NORVASC ) 10 MG tablet TAKE 1 TABLET BY MOUTH DAILY 90 tablet 3  . aspirin  EC 81 MG tablet Take 81 mg by mouth daily.    . B Complex-C (B-COMPLEX WITH VITAMIN C) tablet Take 1 tablet by mouth daily.    . Bempedoic Acid  (NEXLETOL ) 180 MG TABS Take 1 tablet (180 mg total) by mouth daily. 30 tablet 11  . bisoprolol  (ZEBETA ) 5 MG tablet TAKE 1 TABLET BY MOUTH DAILY 90 tablet 2  . calcitRIOL  (ROCALTROL ) 0.25 MCG capsule TAKE 3 CAPSULES BY MOUTH DAILY 300 capsule 2  . calcium  carbonate (TUMS) 500 MG chewable tablet Chew 2 tablets (400 mg of elemental calcium  total) by mouth 2 (two) times daily.    . ciclopirox  (PENLAC ) 8 % solution Apply topically at bedtime. Apply over nail and surrounding skin. Apply daily over previous coat. After seven (7) days, may remove with alcohol and continue cycle. 6.6 mL 0  . clopidogrel  (PLAVIX ) 75 MG tablet TAKE 1 TABLET BY MOUTH DAILY 90 tablet 3  . conjugated estrogens  (PREMARIN ) vaginal cream Apply small amount to vaginal mucosa twice weekly 42.5 g 1  . furosemide  (LASIX ) 20 MG tablet TAKE 1 TABLET BY MOUTH ONCE  WEEKLY AS NEEDED 12 tablet 3  . hydrALAZINE  (APRESOLINE ) 10 MG tablet TAKE 1/2 TABLET (5 MG) BY MOUTH AS NEEDED FOR BP ABOVE 160 OR GREATER 30 tablet 6  . hydrocortisone  (ANUSOL -HC) 2.5 % rectal cream Place 1 Application rectally 2 (two) times daily. 30 g 1  . isosorbide  mononitrate (IMDUR ) 60 MG 24 hr tablet Take 1 tablet (60 mg total) by mouth daily. 100 tablet 3  . levothyroxine  (SYNTHROID ) 112 MCG tablet Take 1 tablet (112 mcg total) by mouth 3 (three) times a week. 90 tablet 1  . Multiple Vitamin (MULTIVITAMIN WITH MINERALS) TABS tablet Take 1 tablet by mouth daily.    . nitroGLYCERIN  (NITROSTAT ) 0.4 MG SL tablet DISSOLVE 1 TABLET UNDER THE  TONGUE EVERY 5 MINUTES  AS NEEDED FOR CHEST PAIN.  MAX OF 3 TABLETS IN 15 MINUTES. CALL 911 IF PAIN  PERSISTS. 75 tablet 3  . pantoprazole  (PROTONIX ) 40 MG tablet TAKE 1 TABLET BY MOUTH DAILY 100 tablet 2  . REPATHA  SURECLICK 140 MG/ML SOAJ INJECT 140MG  SUBCUTANEOUSLY  EVERY 2 WEEKS 6 mL 3  . rosuvastatin  (CRESTOR ) 20 MG tablet Take 1 tablet (20 mg total) by mouth daily. 90 tablet 2  . urea  (CARMOL) 10 % cream Apply topically as needed. 71 g 0  . Vitamin D , Ergocalciferol , (DRISDOL ) 1.25 MG (50000 UNIT) CAPS capsule Take 1 capsule (50,000 Units total) by mouth every 7 (seven) days. 5 capsule 2   No facility-administered medications prior to visit.    Allergies  Allergen Reactions  . Zetia  [Ezetimibe ] Other (See Comments)    Myalgia, paresthesias and weakness  . Penicillin G Rash    Review of Systems  Constitutional:  Positive for malaise/fatigue. Negative for fever.  HENT:  Negative for congestion.   Eyes:  Negative for blurred vision.  Respiratory:  Negative for shortness of breath.   Cardiovascular:  Negative for chest pain, palpitations and leg swelling.  Gastrointestinal:  Positive for constipation. Negative for abdominal pain, blood in stool and nausea.  Genitourinary:  Negative for dysuria and frequency.  Musculoskeletal:  Negative for falls.  Skin:  Positive for itching. Negative for rash.  Neurological:  Negative for dizziness, loss of consciousness and headaches.  Endo/Heme/Allergies:  Negative for environmental allergies.  Psychiatric/Behavioral:  Negative for depression. The patient is nervous/anxious.        Objective:    Physical Exam Constitutional:      General: She is not in acute distress.    Appearance: Normal appearance. She is well-developed. She is not toxic-appearing.  HENT:     Head: Normocephalic and atraumatic.     Right Ear: External ear normal.     Left Ear: External ear normal.     Nose: Nose normal.  Eyes:     General:        Right eye: No discharge.        Left eye: No discharge.      Conjunctiva/sclera: Conjunctivae normal.  Neck:     Thyroid : No thyromegaly.  Cardiovascular:     Rate and Rhythm: Normal rate and regular rhythm.     Heart sounds: Normal heart sounds. No murmur heard. Pulmonary:     Effort: Pulmonary effort is normal. No respiratory distress.     Breath sounds: Normal breath sounds.  Abdominal:     General: Bowel sounds are normal.     Palpations: Abdomen is soft.     Tenderness: There is no abdominal tenderness. There is no guarding.  Musculoskeletal:        General: Normal range of motion.     Cervical back: Neck supple.     Comments: Right wrist small 1cm firm ganglion cyst at ulnar aspect  Lymphadenopathy:     Cervical: No cervical adenopathy.  Skin:    General: Skin is warm and dry.  Neurological:     Mental Status: She is alert and oriented to person, place, and time.  Psychiatric:        Mood and Affect: Mood normal.        Behavior: Behavior normal.        Thought Content: Thought content normal.        Judgment: Judgment normal.    There were no vitals taken for this visit. Wt Readings  from Last 3 Encounters:  08/28/24 193 lb 3.2 oz (87.6 kg)  08/04/24 190 lb (86.2 kg)  08/04/24 190 lb (86.2 kg)    Diabetic Foot Exam - Simple   No data filed    Lab Results  Component Value Date   WBC 9.8 08/04/2024   HGB 13.4 08/04/2024   HCT 40.6 08/04/2024   PLT 349 08/04/2024   GLUCOSE 87 08/28/2024   CHOL 158 06/15/2024   TRIG 152.0 (H) 06/15/2024   HDL 44.00 06/15/2024   LDLDIRECT 96.0 11/06/2022   LDLCALC 84 06/15/2024   ALT 17 08/28/2024   AST 21 08/28/2024   NA 141 08/28/2024   K 4.6 08/28/2024   CL 100 08/28/2024   CREATININE 1.29 (H) 08/28/2024   BUN 23 08/28/2024   CO2 24 08/28/2024   TSH 1.06 06/15/2024   INR 0.9 02/05/2023   HGBA1C 5.8 (H) 05/04/2024    Lab Results  Component Value Date   TSH 1.06 06/15/2024   Lab Results  Component Value Date   WBC 9.8 08/04/2024   HGB 13.4 08/04/2024   HCT 40.6  08/04/2024   MCV 87.3 08/04/2024   PLT 349 08/04/2024   Lab Results  Component Value Date   NA 141 08/28/2024   K 4.6 08/28/2024   CO2 24 08/28/2024   GLUCOSE 87 08/28/2024   BUN 23 08/28/2024   CREATININE 1.29 (H) 08/28/2024   BILITOT 0.4 08/28/2024   ALKPHOS 92 08/28/2024   AST 21 08/28/2024   ALT 17 08/28/2024   PROT 7.1 08/28/2024   ALBUMIN 4.1 08/28/2024   CALCIUM  9.9 08/28/2024   ANIONGAP 13 08/04/2024   EGFR 43 (L) 08/28/2024   GFR 50.28 (L) 02/26/2024   Lab Results  Component Value Date   CHOL 158 06/15/2024   Lab Results  Component Value Date   HDL 44.00 06/15/2024   Lab Results  Component Value Date   LDLCALC 84 06/15/2024   Lab Results  Component Value Date   TRIG 152.0 (H) 06/15/2024   Lab Results  Component Value Date   CHOLHDL 4 06/15/2024   Lab Results  Component Value Date   HGBA1C 5.8 (H) 05/04/2024       Assessment & Plan:  Mixed hyperlipidemia Assessment & Plan: Encourage heart healthy diet such as MIND or DASH diet, increase exercise, avoid trans fats, simple carbohydrates and processed foods, consider a krill or fish or flaxseed oil cap daily.  Tolerating Rosuvastatin    Zinc  deficiency Assessment & Plan: Supplement and monitor    Tension headache Assessment & Plan: Encouraged increased hydration, 64 ounces of clear fluids daily. Minimize alcohol and caffeine. Eat small frequent meals with lean proteins and complex carbs. Avoid high and low blood sugars. Get adequate sleep, 7-8 hours a night. Needs exercise daily preferably in the morning.    Acquired hypothyroidism Assessment & Plan: On Levothyroxine , continue to monitor      Assessment and Plan Assessment & Plan Hypertension Blood pressure readings at home have been around 130 mmHg, but recently spiked to 160 mmHg. Current reading in the office is 138/68 mmHg. Hydralazine  is used as needed for elevated blood pressure. Discussion on the complexity of hypertension  management and the shift towards using multiple medications at lower doses to address various contributing factors. - Continue current antihypertensive regimen. - Use hydralazine  as needed for elevated blood pressure. - Monitor blood pressure at home and report readings.  Chronic kidney disease Concern about kidney function due to age and previous findings  of protein in urine. No specific symptoms reported, but nocturia is present. - Order metabolic panel to assess kidney function. - Order urine microalbumin test to check for protein in urine.  Post-surgical hypothyroidism Currently taking levothyroxine  112 mcg daily instead of three times a week as prescribed. Previous TSH level in June was 1.06. Plan to check current thyroid  function to determine appropriate dosing. - Order TSH level to assess current thyroid  function. - Consider adjusting levothyroxine  dose based on TSH results. - Send prescription for levothyroxine  112 mcg daily to pharmacy but advise not to pick up until lab results are reviewed.  Constipation Reports bowel movements twice a week with straining. Prefers natural remedies over medications. Discussion on the importance of regular bowel movements to prevent increased pressure on the colon. - Recommend Benefiber (odorless, tasteless fiber supplement) daily. - Encourage adequate hydration (5 ounces of water every hour). - Suggest probiotics and a fiber-rich diet. - Provide recipe for milk of magnesia and prune juice for acute constipation relief.  Dietary zinc  deficiency Previously had low zinc  levels. Currently taking zinc  supplements. Discussion on the importance of a multivitamin with minerals to ensure adequate intake of trace elements. - Order zinc  level to assess current status. - Recommend switching to a multivitamin with minerals for comprehensive supplementation.  Onychomycosis (toenail and fingernail) Fungal infection suspected in fingernails, unusual but  possible. Toenails remain unchanged. Discussion on the difficulty of treating fungal infections and the potential liver impact of oral antifungals. - Prescribe Penlac  (ciclopirox ) for application on affected nails. - Continue home remedies such as vinegar and baking soda soaks.  Recurrent ganglion cyst Ganglion cyst on the wrist is recurrent and causing pain up the arm. Previous aspiration was performed, but cyst recurred. - Consider surgical removal if pain persists and affects function. - Suggest using Aspercreme for symptomatic relief.  General Health Maintenance Discussion on the importance of continued mammograms due to improved treatment outcomes for breast cancer. Colonoscopy completed this year with no further screening needed unless symptomatic. Discussion on vaccinations for pneumonia and RSV. - Order mammogram for November 20 or later. - Discuss potential vaccinations: Prevnar 20 and RSV vaccine.  Follow-up Plan for regular follow-up visits to monitor ongoing health conditions and adjust treatment as necessary. - Schedule follow-up appointments every 3-4 months. - Arrange for lab work results to be reviewed and discussed.  Recording duration: 48 minutes     Harlene Horton, MD

## 2024-10-12 ENCOUNTER — Encounter: Payer: Self-pay | Admitting: Family Medicine

## 2024-10-12 ENCOUNTER — Ambulatory Visit (INDEPENDENT_AMBULATORY_CARE_PROVIDER_SITE_OTHER): Payer: 59 | Admitting: Family Medicine

## 2024-10-12 VITALS — BP 138/68 | HR 74 | Temp 98.1°F | Resp 18 | Ht 65.0 in | Wt 193.8 lb

## 2024-10-12 DIAGNOSIS — B351 Tinea unguium: Secondary | ICD-10-CM | POA: Diagnosis not present

## 2024-10-12 DIAGNOSIS — E162 Hypoglycemia, unspecified: Secondary | ICD-10-CM | POA: Diagnosis not present

## 2024-10-12 DIAGNOSIS — E782 Mixed hyperlipidemia: Secondary | ICD-10-CM | POA: Diagnosis not present

## 2024-10-12 DIAGNOSIS — G44209 Tension-type headache, unspecified, not intractable: Secondary | ICD-10-CM

## 2024-10-12 DIAGNOSIS — E039 Hypothyroidism, unspecified: Secondary | ICD-10-CM

## 2024-10-12 DIAGNOSIS — E60 Dietary zinc deficiency: Secondary | ICD-10-CM

## 2024-10-12 DIAGNOSIS — Z1231 Encounter for screening mammogram for malignant neoplasm of breast: Secondary | ICD-10-CM

## 2024-10-12 DIAGNOSIS — Z0000 Encounter for general adult medical examination without abnormal findings: Secondary | ICD-10-CM

## 2024-10-12 DIAGNOSIS — R351 Nocturia: Secondary | ICD-10-CM

## 2024-10-12 DIAGNOSIS — E559 Vitamin D deficiency, unspecified: Secondary | ICD-10-CM | POA: Diagnosis not present

## 2024-10-12 DIAGNOSIS — I10 Essential (primary) hypertension: Secondary | ICD-10-CM | POA: Diagnosis not present

## 2024-10-12 MED ORDER — LEVOTHYROXINE SODIUM 112 MCG PO TABS: 112.0000 ug | ORAL_TABLET | Freq: Every day | ORAL | 1 refills | Status: DC

## 2024-10-12 MED ORDER — CICLOPIROX 8 % EX SOLN: Freq: Every day | CUTANEOUS | 3 refills | Status: AC

## 2024-10-12 NOTE — Assessment & Plan Note (Signed)
 Patient encouraged to maintain heart healthy diet, regular exercise, adequate sleep. Consider daily probiotics. Take medications as prescribed. Given and reviewed copy of ACP documents from [employer] and encouraged to complete and return.  Colonoscopy 2025 no further unless symptoms MGM 10/2023 repeat annually Dexa 2025 repeat in 2030 Pap aged out

## 2024-10-12 NOTE — Patient Instructions (Addendum)
 NOW company multivitamin with minerals at [email or web address]  Encouraged increased hydration and fiber in diet. Daily probiotics. If bowels not moving can use MOM 2 tbls po in 4 oz of warm prune juice by mouth every 2-3 days. If no results then repeat in 4 hours with  Dulcolax suppository pr, may repeat again in 4 more hours as needed. Seek care if symptoms worsen. Consider daily Miralax  and/or Dulcolax if symptoms persist.  Consider daily Benefiber  Preventive Care 65 Years and Older, Female Preventive care refers to lifestyle choices and visits with your health care provider that can promote health and wellness. Preventive care visits are also called wellness exams. What can I expect for my preventive care visit? Counseling Your health care provider may ask you questions about your: Medical history, including: Past medical problems. Family medical history. Pregnancy and menstrual history. History of falls. Current health, including: Memory and ability to understand (cognition). Emotional well-being. Home life and relationship well-being. Sexual activity and sexual health. Lifestyle, including: Alcohol, nicotine or tobacco, and drug use. Access to firearms. Diet, exercise, and sleep habits. Work and work Astronomer. Sunscreen use. Safety issues such as seatbelt and bike helmet use. Physical exam Your health care provider will check your: Height and weight. These may be used to calculate your BMI (body mass index). BMI is a measurement that tells if you are at a healthy weight. Waist circumference. This measures the distance around your waistline. This measurement also tells if you are at a healthy weight and may help predict your risk of certain diseases, such as type 2 diabetes and high blood pressure. Heart rate and blood pressure. Body temperature. Skin for abnormal spots. What immunizations do I need?  Vaccines are usually given at various ages, according to a schedule. Your health  care provider will recommend vaccines for you based on your age, medical history, and lifestyle or other factors, such as travel or where you work. What tests do I need? Screening Your health care provider may recommend screening tests for certain conditions. This may include: Lipid and cholesterol levels. Hepatitis C test. Hepatitis B test. HIV (human immunodeficiency virus) test. STI (sexually transmitted infection) testing, if you are at risk. Lung cancer screening. Colorectal cancer screening. Diabetes screening. This is done by checking your blood sugar (glucose) after you have not eaten for a while (fasting). Mammogram. Talk with your health care provider about how often you should have regular mammograms. BRCA-related cancer screening. This may be done if you have a family history of breast, ovarian, tubal, or peritoneal cancers. Bone density scan. This is done to screen for osteoporosis. Talk with your health care provider about your test results, treatment options, and if necessary, the need for more tests. Follow these instructions at home: Eating and drinking  Eat a diet that includes fresh fruits and vegetables, whole grains, lean protein, and low-fat dairy products. Limit your intake of foods with high amounts of sugar, saturated fats, and salt. Take vitamin and mineral supplements as recommended by your health care provider. Do not drink alcohol if your health care provider tells you not to drink. If you drink alcohol: Limit how much you have to 0-1 drink a day. Know how much alcohol is in your drink. In the [place] one drink equals one 12 oz bottle of beer (355 mL), one 5 oz glass of wine (148 mL), or one 1 oz glass of hard liquor (44 mL). Lifestyle Brush your teeth every morning and night with fluoride toothpaste. Floss  one time each day. Exercise for at least 30 minutes 5 or more days each week. Do not use any products that contain nicotine or tobacco. These products  include cigarettes, chewing tobacco, and vaping devices, such as e-cigarettes. If you need help quitting, ask your health care provider. Do not use drugs. If you are sexually active, practice safe sex. Use a condom or other form of protection in order to prevent STIs. Take aspirin  only as told by your health care provider. Make sure that you understand how much to take and what form to take. Work with your health care provider to find out whether it is safe and beneficial for you to take aspirin  daily. Ask your health care provider if you need to take a cholesterol-lowering medicine (statin). Find healthy ways to manage stress, such as: Meditation, yoga, or listening to music. Journaling. Talking to a trusted person. Spending time with friends and family. Minimize exposure to UV radiation to reduce your risk of skin cancer. Safety Always wear your seat belt while driving or riding in a vehicle. Do not drive: If you have been drinking alcohol. Do not ride with someone who has been drinking. When you are tired or distracted. While texting. If you have been using any mind-altering substances or drugs. Wear a helmet and other protective equipment during sports activities. If you have firearms in your house, make sure you follow all gun safety procedures. What's next? Visit your health care provider once a year for an annual wellness visit. Ask your health care provider how often you should have your eyes and teeth checked. Stay up to date on all vaccines. This information is not intended to replace advice given to you by your health care provider. Make sure you discuss any questions you have with your health care provider. Document Revised: 06/07/2021 Document Reviewed: 06/07/2021 Elsevier Patient Education  2024 [employer].

## 2024-10-12 NOTE — Addendum Note (Signed)
 Addended by: DOMENICA BLACKBIRD A on: 10/12/2024 10:52 PM   Modules accepted: Level of Service

## 2024-10-13 ENCOUNTER — Ambulatory Visit: Payer: Self-pay | Admitting: Family Medicine

## 2024-10-13 LAB — URINALYSIS, ROUTINE W REFLEX MICROSCOPIC
Bilirubin Urine: NEGATIVE
Hgb urine dipstick: NEGATIVE
Ketones, ur: NEGATIVE
Leukocytes,Ua: NEGATIVE
Nitrite: NEGATIVE
RBC / HPF: NONE SEEN (ref 0–?)
Specific Gravity, Urine: 1.01 (ref 1.000–1.030)
Total Protein, Urine: NEGATIVE
Urine Glucose: NEGATIVE
Urobilinogen, UA: 0.2 (ref 0.0–1.0)
pH: 7 (ref 5.0–8.0)

## 2024-10-13 LAB — LIPID PANEL
Cholesterol: 184 mg/dL (ref 0–200)
HDL: 47.7 mg/dL (ref >39.00)
LDL Cholesterol: 82 mg/dL (ref 0–99)
NonHDL: 136.14
Total CHOL/HDL Ratio: 4
Triglycerides: 272 mg/dL — ABNORMAL HIGH (ref 0.0–149.0)
VLDL: 54.4 mg/dL — ABNORMAL HIGH (ref 0.0–40.0)

## 2024-10-13 LAB — CBC WITH DIFFERENTIAL/PLATELET
Basophils Absolute: 0.2 K/uL — ABNORMAL HIGH (ref 0.0–0.1)
Basophils Relative: 2.7 % (ref 0.0–3.0)
Eosinophils Absolute: 0.3 K/uL (ref 0.0–0.7)
Eosinophils Relative: 3.8 % (ref 0.0–5.0)
HCT: 41.4 % (ref 36.0–46.0)
Hemoglobin: 13.8 g/dL (ref 12.0–15.0)
Lymphocytes Relative: 29 % (ref 12.0–46.0)
Lymphs Abs: 2.6 K/uL (ref 0.7–4.0)
MCHC: 33.4 g/dL (ref 30.0–36.0)
MCV: 86.5 fl (ref 78.0–100.0)
Monocytes Absolute: 0.8 K/uL (ref 0.1–1.0)
Monocytes Relative: 9.1 % (ref 3.0–12.0)
Neutro Abs: 5 K/uL (ref 1.4–7.7)
Neutrophils Relative %: 55.4 % (ref 43.0–77.0)
Platelets: 319 K/uL (ref 150.0–400.0)
RBC: 4.79 Mil/uL (ref 3.87–5.11)
RDW: 14.7 % (ref 11.5–15.5)
WBC: 9 K/uL (ref 4.0–10.5)

## 2024-10-13 LAB — URINE CULTURE
MICRO NUMBER:: 17121270
Result:: NO GROWTH
SPECIMEN QUALITY:: ADEQUATE

## 2024-10-13 LAB — COMPREHENSIVE METABOLIC PANEL WITH GFR
ALT: 13 U/L (ref 0–35)
AST: 16 U/L (ref 0–37)
Albumin: 4.2 g/dL (ref 3.5–5.2)
Alkaline Phosphatase: 78 U/L (ref 39–117)
BUN: 23 mg/dL (ref 6–23)
CO2: 30 meq/L (ref 19–32)
Calcium: 8.4 mg/dL (ref 8.4–10.5)
Chloride: 101 meq/L (ref 96–112)
Creatinine, Ser: 1.01 mg/dL (ref 0.40–1.20)
GFR: 53.64 mL/min — ABNORMAL LOW (ref >60.00)
Glucose, Bld: 84 mg/dL (ref 70–99)
Potassium: 4 meq/L (ref 3.5–5.1)
Sodium: 141 meq/L (ref 135–145)
Total Bilirubin: 0.4 mg/dL (ref 0.2–1.2)
Total Protein: 7.1 g/dL (ref 6.0–8.3)

## 2024-10-13 LAB — MICROALBUMIN / CREATININE URINE RATIO
Creatinine,U: 103.7 mg/dL
Microalb Creat Ratio: 7.4 mg/g (ref 0.0–30.0)
Microalb, Ur: 0.8 mg/dL (ref 0.0–1.9)

## 2024-10-13 LAB — HEMOGLOBIN A1C: Hgb A1c MFr Bld: 5.9 % (ref 4.6–6.5)

## 2024-10-13 LAB — VITAMIN D 25 HYDROXY (VIT D DEFICIENCY, FRACTURES): VITD: 66.51 ng/mL (ref 30.00–100.00)

## 2024-10-13 LAB — TSH: TSH: 1.56 u[IU]/mL (ref 0.35–5.50)

## 2024-10-15 ENCOUNTER — Telehealth (HOSPITAL_BASED_OUTPATIENT_CLINIC_OR_DEPARTMENT_OTHER): Payer: Self-pay

## 2024-10-15 LAB — ZINC: Zinc: 78 ug/dL (ref 60–130)

## 2024-10-26 ENCOUNTER — Encounter: Payer: Self-pay | Admitting: Radiology

## 2024-11-16 ENCOUNTER — Other Ambulatory Visit: Payer: Self-pay | Admitting: Family Medicine

## 2024-11-16 ENCOUNTER — Other Ambulatory Visit: Payer: Self-pay | Admitting: Family

## 2024-11-16 ENCOUNTER — Other Ambulatory Visit: Payer: Self-pay | Admitting: Internal Medicine

## 2024-11-16 DIAGNOSIS — R079 Chest pain, unspecified: Secondary | ICD-10-CM

## 2024-11-16 DIAGNOSIS — E039 Hypothyroidism, unspecified: Secondary | ICD-10-CM

## 2024-11-16 DIAGNOSIS — K2100 Gastro-esophageal reflux disease with esophagitis, without bleeding: Secondary | ICD-10-CM

## 2024-11-17 ENCOUNTER — Encounter: Payer: Self-pay | Admitting: Internal Medicine

## 2024-12-12 ENCOUNTER — Ambulatory Visit (HOSPITAL_BASED_OUTPATIENT_CLINIC_OR_DEPARTMENT_OTHER)

## 2025-01-11 ENCOUNTER — Telehealth: Payer: Self-pay

## 2025-01-11 ENCOUNTER — Telehealth: Payer: Self-pay | Admitting: Internal Medicine

## 2025-01-11 DIAGNOSIS — I214 Non-ST elevation (NSTEMI) myocardial infarction: Secondary | ICD-10-CM

## 2025-01-11 DIAGNOSIS — I2510 Atherosclerotic heart disease of native coronary artery without angina pectoris: Secondary | ICD-10-CM

## 2025-01-11 NOTE — Telephone Encounter (Signed)
" °*  STAT* If patient is at the pharmacy, call can be transferred to refill team.   1. Which medications need to be refilled? (please list name of each medication and dose if known) REPATHA  SURECLICK 140 MG/ML SOAJ    2. Would you like to learn more about the convenience, safety, & potential cost savings by using the [facility]?    3. Are you open to using the [facility] (Type [facility]. ).   4. Which pharmacy/location (including street and city if local pharmacy) is medication to be sent to?  [id number] - [place], [address] - [address]       5. Do they need a 30 day or 90 day supply? 90 day  "

## 2025-01-11 NOTE — Telephone Encounter (Signed)
 Patient was advised that cardiology refills the medication for her.

## 2025-01-11 NOTE — Telephone Encounter (Signed)
 Copied from CRM [id number]. Topic: Clinical - Medication Refill >> Jan 11, 2025 11:46 AM Vena HERO wrote: Medication: REPATHA  SURECLICK 140 MG/ML SOAJ  Pt received supply but it was damaged and broken, does not work. Please call pt for more information  Has the patient contacted their pharmacy? Yes (Agent: If no, request that the patient contact the pharmacy for the refill. If patient does not wish to contact the pharmacy document the reason why and proceed with request.) (Agent: If yes, when and what did the pharmacy advise?) Told pt to call company but they were the ones who supplied and shipped.  This is the patient's preferred pharmacy:   [id number] - [place], [place] - [address] [facility] [address] [place] [address] Phone: [phone] Fax: [phone] Hours: Not open 24 hours   Is this the correct pharmacy for this prescription? Yes If no, delete pharmacy and type the correct one.   Has the prescription been filled recently? Yes  Is the patient out of the medication? Yes  Has the patient been seen for an appointment in the last year OR does the patient have an upcoming appointment? Yes  Can we respond through MyChart? No, phone call preferred  Agent: Please be advised that Rx refills may take up to 3 business days. We ask that you follow-up with your pharmacy.

## 2025-01-14 ENCOUNTER — Other Ambulatory Visit (HOSPITAL_COMMUNITY): Payer: Self-pay

## 2025-01-14 MED ORDER — REPATHA SURECLICK 140 MG/ML ~~LOC~~ SOAJ
140.0000 mg | SUBCUTANEOUS | 1 refills | Status: AC
  Filled 2025-01-14 – 2025-01-19 (×2): qty 6, 84d supply, fill #0

## 2025-01-14 NOTE — Telephone Encounter (Signed)
 Pt's medication was sent to pt's pharmacy as requested. Confirmation received.

## 2025-01-17 ENCOUNTER — Encounter (HOSPITAL_COMMUNITY): Payer: Self-pay | Admitting: Emergency Medicine

## 2025-01-17 ENCOUNTER — Other Ambulatory Visit: Payer: Self-pay

## 2025-01-17 ENCOUNTER — Emergency Department (HOSPITAL_COMMUNITY)

## 2025-01-17 ENCOUNTER — Emergency Department (HOSPITAL_COMMUNITY)
Admission: EM | Admit: 2025-01-17 | Discharge: 2025-01-17 | Disposition: A | Discharge status: Home / Self Care | Attending: Emergency Medicine | Admitting: Emergency Medicine

## 2025-01-17 DIAGNOSIS — R200 Anesthesia of skin: Secondary | ICD-10-CM | POA: Diagnosis not present

## 2025-01-17 DIAGNOSIS — E039 Hypothyroidism, unspecified: Secondary | ICD-10-CM | POA: Insufficient documentation

## 2025-01-17 DIAGNOSIS — R202 Paresthesia of skin: Secondary | ICD-10-CM | POA: Diagnosis present

## 2025-01-17 DIAGNOSIS — I10 Essential (primary) hypertension: Secondary | ICD-10-CM | POA: Diagnosis not present

## 2025-01-17 DIAGNOSIS — Z79899 Other long term (current) drug therapy: Secondary | ICD-10-CM | POA: Insufficient documentation

## 2025-01-17 DIAGNOSIS — Z955 Presence of coronary angioplasty implant and graft: Secondary | ICD-10-CM | POA: Insufficient documentation

## 2025-01-17 DIAGNOSIS — R791 Abnormal coagulation profile: Secondary | ICD-10-CM | POA: Diagnosis not present

## 2025-01-17 DIAGNOSIS — I2510 Atherosclerotic heart disease of native coronary artery without angina pectoris: Secondary | ICD-10-CM | POA: Diagnosis not present

## 2025-01-17 LAB — DIFFERENTIAL
Abs Immature Granulocytes: 0.06 10*3/uL (ref 0.00–0.07)
Basophils Absolute: 0.1 10*3/uL (ref 0.0–0.1)
Basophils Relative: 1 %
Eosinophils Absolute: 0.3 10*3/uL (ref 0.0–0.5)
Eosinophils Relative: 4 %
Immature Granulocytes: 1 %
Lymphocytes Relative: 31 %
Lymphs Abs: 2.3 10*3/uL (ref 0.7–4.0)
Monocytes Absolute: 0.5 10*3/uL (ref 0.1–1.0)
Monocytes Relative: 7 %
Neutro Abs: 4.1 10*3/uL (ref 1.7–7.7)
Neutrophils Relative %: 56 %

## 2025-01-17 LAB — COMPREHENSIVE METABOLIC PANEL WITH GFR
ALT: 9 U/L (ref 0–44)
AST: 19 U/L (ref 15–41)
Albumin: 3.6 g/dL (ref 3.5–5.0)
Alkaline Phosphatase: 86 U/L (ref 38–126)
Anion gap: 11 (ref 5–15)
BUN: 23 mg/dL (ref 8–23)
CO2: 23 mmol/L (ref 22–32)
Calcium: 7.9 mg/dL — ABNORMAL LOW (ref 8.9–10.3)
Chloride: 107 mmol/L (ref 98–111)
Creatinine, Ser: 1.06 mg/dL — ABNORMAL HIGH (ref 0.44–1.00)
GFR, Estimated: 54 mL/min — ABNORMAL LOW
Glucose, Bld: 96 mg/dL (ref 70–99)
Potassium: 3.9 mmol/L (ref 3.5–5.1)
Sodium: 142 mmol/L (ref 135–145)
Total Bilirubin: 0.3 mg/dL (ref 0.0–1.2)
Total Protein: 6.6 g/dL (ref 6.5–8.1)

## 2025-01-17 LAB — CBG MONITORING, ED: Glucose-Capillary: 93 mg/dL (ref 70–99)

## 2025-01-17 LAB — CBC
HCT: 41.2 % (ref 36.0–46.0)
Hemoglobin: 13.6 g/dL (ref 12.0–15.0)
MCH: 28.9 pg (ref 26.0–34.0)
MCHC: 33 g/dL (ref 30.0–36.0)
MCV: 87.7 fL (ref 80.0–100.0)
Platelets: 298 10*3/uL (ref 150–400)
RBC: 4.7 MIL/uL (ref 3.87–5.11)
RDW: 13.6 % (ref 11.5–15.5)
WBC: 7.3 10*3/uL (ref 4.0–10.5)
nRBC: 0 % (ref 0.0–0.2)

## 2025-01-17 LAB — I-STAT CHEM 8, ED
BUN: 24 mg/dL — ABNORMAL HIGH (ref 8–23)
Calcium, Ion: 0.97 mmol/L — ABNORMAL LOW (ref 1.15–1.40)
Chloride: 107 mmol/L (ref 98–111)
Creatinine, Ser: 1.1 mg/dL — ABNORMAL HIGH (ref 0.44–1.00)
Glucose, Bld: 96 mg/dL (ref 70–99)
HCT: 40 % (ref 36.0–46.0)
Hemoglobin: 13.6 g/dL (ref 12.0–15.0)
Potassium: 3.7 mmol/L (ref 3.5–5.1)
Sodium: 145 mmol/L (ref 135–145)
TCO2: 24 mmol/L (ref 22–32)

## 2025-01-17 LAB — PROTIME-INR
INR: 0.9 (ref 0.8–1.2)
Prothrombin Time: 12.9 s (ref 11.4–15.2)

## 2025-01-17 LAB — APTT: aPTT: 30 s (ref 24–36)

## 2025-01-17 LAB — ETHANOL: Alcohol, Ethyl (B): <15 mg/dL

## 2025-01-17 MED ORDER — LORAZEPAM 2 MG/ML IJ SOLN
INTRAMUSCULAR | Status: AC
  Filled 2025-01-17: qty 1

## 2025-01-17 MED ORDER — LORAZEPAM 2 MG/ML IJ SOLN
1.0000 mg | Freq: Once | INTRAMUSCULAR | Status: AC
  Administered 2025-01-17: 1 mg via INTRAVENOUS

## 2025-01-17 MED ORDER — SODIUM CHLORIDE 0.9 % IV BOLUS
500.0000 mL | Freq: Once | INTRAVENOUS | Status: AC
  Administered 2025-01-17: 500 mL via INTRAVENOUS

## 2025-01-17 MED ORDER — SODIUM CHLORIDE 0.9% FLUSH
3.0000 mL | Freq: Once | INTRAVENOUS | Status: AC
  Administered 2025-01-17: 3 mL via INTRAVENOUS

## 2025-01-17 NOTE — ED Notes (Signed)
 Pt requesting to speak to MD prior to discharge, this RN notified Doretha MD

## 2025-01-17 NOTE — ED Notes (Addendum)
 This RN to room to discharge pt, pt is very irritable towards this RN, Pt demanding an Ambulance to take her home. This RN consulted [facility] RN-charge, informed that social work notified earlier that cabs are not in service today.  Pt refusing to call daughter for ride home. Pt not open to communicating    This RN now informed that one cab company is providing service today. NT to assist pt with securing cab

## 2025-01-17 NOTE — ED Triage Notes (Signed)
 Pt arrived via [facility] from home c/o left sided facial numbness and left arm numbness/weakness. EMS BP on arrival was 190/90. Pt has hx of hypertension but is med compliant. Daughter stays with her at home.

## 2025-01-17 NOTE — ED Provider Notes (Signed)
 "  Emergency Department Provider Note   I have reviewed the triage vital signs and the nursing notes.   HISTORY  Chief Complaint Code Stroke   HPI [name] is a 79 y.o. female with past history reviewed below including CAD, hypertension, hyperlipidemia presents to the emergency department with acute onset left face numbness with intermittent left arm numbness.  Last normal at noon today. No vision or speech change. No CP. EMS arrived to find a left face sensory deficit. Currently, the left arm is without numbness but some report of that initially. No weakness. No speech changes. Patient is hypertensive en route. CODE STROKE activated by EMS.    Past Medical History:  Diagnosis Date   Acute MI, subendocardial (HCC)    Arthritis    Back pain 05/31/2013   Benign paroxysmal positional vertigo 08/05/2016   CAD (coronary artery disease)    a. NSTEMI 10/2011: LHC 11/19/11: pLAD 30%, oOM 60%, AVCFX 30%, CFX after OM2 30%, pRCA 60 and 70%, then 99%, AM 80-90% with TIMI 3 flow.  PCI: Promus DES x 2 to RCA; b. 06/2012 Cath: patent RCA stents w/ subtl occl of Acute Marginal (jailed)->Med rx; c. 05/2015 MV: EF 59%, mod mid infsept/inf/ap lat/ap infarct with peri-infarct isch-->Med Rx; d. 02/2016 Cath: LM nl, LAD 37m, RI 50, RCA patent stents.   Colitis    Facial skin lesion 01/28/2017   GERD (gastroesophageal reflux disease)    occasional   Hyperlipidemia    Hypertension    Hypertensive heart disease    a. Echocardiogram 11/19/11: Difficult acoustic windows, EF 60-65%, normal LV wall thickness, grade 1 diastolic dysfunction   Hypoparathyroidism    Low zinc  level 11/26/2016   Multinodular thyroid     Goiter s/p thyroidectomy in 2007 with post-op  hypocalcemia and post-op hypothyroidism/hypoparathyroidism with hypocalcemia   Neck pain on right side 07/13/2013   Nocturia 05/31/2013   Osteoarthritis    Pain in right axilla 03/13/2016   Palpitations    a. 03/2012 - patient set up for event monitor  but did not wear correctly -  she declined wearing a repeat monitor   Post-surgical hypothyroidism    Sun-damaged skin 12/05/2014   Tubular adenoma of colon 06/2011   Unspecified constipation 05/31/2013   Vertigo    Zinc  deficiency 11/26/2015    Review of Systems  Constitutional: No fever/chills Cardiovascular: Denies chest pain. Respiratory: Denies shortness of breath. Gastrointestinal: No abdominal pain.  No nausea, no vomiting.  Skin: Negative for rash. Neurological: Negative for headaches. Positive left face numbness.    ____________________________________________   PHYSICAL EXAM:  VITAL SIGNS: Vitals:   01/17/25 1500 01/17/25 1515  BP: (!) 138/55 (!) 140/54  Pulse: (!) 59 (!) 56  Resp: 19 19  Temp:    SpO2: 96% 96%     Constitutional: Alert and oriented. Well appearing and in no acute distress. Eyes: Conjunctivae are normal. PERRL. EOMI. Head: Atraumatic. Nose: No congestion/rhinnorhea. Mouth/Throat: Mucous membranes are moist.  Oropharynx non-erythematous. Neck: No stridor.  Cardiovascular: Normal rate, regular rhythm. Good peripheral circulation. Grossly normal heart sounds.   Respiratory: Normal respiratory effort.  No retractions. Lungs CTAB. Gastrointestinal: Soft and nontender. No distention.  Musculoskeletal: No lower extremity tenderness nor edema. No gross deformities of extremities. Neurologic:  Normal speech and language. Decreased sensation to the left cheek. No facial asymmetry. Normal sensation in the arms/legs. 5/5 strength in the bilateral upper and lower extremities.  Skin:  Skin is warm, dry and intact. No rash noted.  ____________________________________________   LABS (all labs ordered are listed, but only abnormal results are displayed)  Labs Reviewed  COMPREHENSIVE METABOLIC PANEL WITH GFR - Abnormal; Notable for the following components:      Result Value   Creatinine, Ser 1.06 (*)    Calcium  7.9 (*)    GFR, Estimated 54 (*)    All  other components within normal limits  I-STAT CHEM 8, ED - Abnormal; Notable for the following components:   BUN 24 (*)    Creatinine, Ser 1.10 (*)    Calcium , Ion 0.97 (*)    All other components within normal limits  PROTIME-INR  APTT  CBC  DIFFERENTIAL  ETHANOL  CBG MONITORING, ED   ____________________________________________   PROCEDURES  Procedure(s) performed:   Procedures  None ____________________________________________   INITIAL IMPRESSION / ASSESSMENT AND PLAN / ED COURSE  Pertinent labs & imaging results that were available during my care of the patient were reviewed by me and considered in my medical decision making (see chart for details).   This patient is Presenting for Evaluation of CVA symptoms, which does require a range of treatment options, and is a complaint that involves a high risk of morbidity and mortality.  The Differential Diagnoses includes but is not exclusive to alcohol, illicit or prescription medications, intracranial pathology such as stroke, intracerebral hemorrhage, fever or infectious causes including sepsis, hypoxemia, uremia, trauma, endocrine related disorders such as diabetes, hypoglycemia, thyroid -related diseases, etc.   Critical Interventions-    Medications  sodium chloride  flush (NS) 0.9 % injection 3 mL (3 mLs Intravenous Given 01/17/25 1442)  LORazepam  (ATIVAN ) injection 1 mg (1 mg Intravenous Given 01/17/25 1441)  sodium chloride  0.9 % bolus 500 mL (0 mLs Intravenous Stopped 01/17/25 1542)    Reassessment after intervention:  Minimal neuro deficits. Not a TNK candidate.    I did obtain Additional Historical Information from EMS, as the patient is having CVA symptoms.  I decided to review pertinent External Data, and in summary similar admit in February 2024 with TIA evaluation at that time.   Clinical Laboratory Tests Ordered, included CBC without anemia. INR normal. Creatinine 1.10.   Radiologic Tests Ordered,  included CT head. I independently interpreted the images and agree with radiology interpretation.   Cardiac Monitor Tracing which shows NSR.    Social Determinants of Health Risk patient is a non-smoker.   Consult complete with Neurology, Dr. Voncile. Plan for MRI brain and if negative can d/c with outpatient follow up plan.   Medical Decision Making: Summary:  Patient arrives to the emergency department with left face numbness.  Taken to CT emergently with neurology.  Airway patent. NIH is low. No TNK.   Reevaluation with update and discussion with patient. Symptoms improved. MRI negative.   Considered admission but MRI without acute stroke and symptoms improved.  Stable for discharge.  Patient's presentation is most consistent with acute presentation with potential threat to life or bodily function.   Disposition: discharge  ____________________________________________  FINAL CLINICAL IMPRESSION(S) / ED DIAGNOSES  Final diagnoses:  Numbness     Note:  This document was prepared using Dragon voice recognition software and may include unintentional dictation errors.  Fonda Law, MD, [facility] Emergency Medicine    Khaliya Golinski, Fonda MATSU, MD 01/22/25 [phone]  "

## 2025-01-17 NOTE — Discharge Instructions (Signed)
 Please follow with the Neurologist. Return with any new or worsening symptoms.

## 2025-01-17 NOTE — Code Documentation (Addendum)
 Stroke Response Nurse Documentation Code Documentation  [name] is a 79 y.o. female arriving to [facility]  via [facility] on 01/17/2025 with past medical hx of CAD, HTN, HLD. On aspirin  81 mg daily and clopidogrel  75 mg daily. Code stroke was activated by EMS.   Patient from home where she was LKW at 1200 and now complaining of left face numbness.   Stroke team at the bedside on patient arrival. Labs drawn and patient cleared for CT by Dr. Darra. Patient to CT with team.   NIHSS 1, see documentation for details and code stroke times. Patient with left face numbness on exam.   The following imaging was completed:  CT Head and MRI.  Patient is not a candidate for IV Thrombolytic due to too mild to treat. Patient is not a candidate for IR due to assessment negative for LVO.   Care Plan: q28min NIHSS and VS until 1630.   Process Delays Noted: n/a  Bedside handoff with ED RN Sharyne.    [name] L [name]  Rapid Response RN

## 2025-01-17 NOTE — Consult Note (Signed)
 NEUROLOGY CONSULT NOTE   Date of service: January 17, 2025 Patient Name: [name] MRN:  [id number] DOB:  1946-11-30 Chief Complaint: left facial numbness Requesting Provider: Darra Fonda MATSU, MD  History of Present Illness  [name] is a 79 y.o. female with hx of hypertension, hyperlipidemia, prior transient left-sided paresthesia of unclear etiology which was not thought to be a TIA in 2024 when she presented with left facial numbness at that time, presents for evaluation of sudden onset of left facial numbness again.  Upon asking us  if the symptoms are similar to the prior episode, she said at that time she had arm numbness but face numbness is now here for the first time. Last known well 12 PM. Denies headache at this time.  Denies chest pain or shortness of breath.  LKW: 12 PM Modified rankin score: 0-Completely asymptomatic and back to baseline post- stroke IV Thrombolysis: Too mild to treat EVT: Exam not consistent with LVO-hence advanced imaging not performed.  Taken for stat MRI NIH stroke scale-1 for subjective left facial paresthesias   ROS  Comprehensive ROS performed and pertinent positives documented in HPI   Past History   Past Medical History:  Diagnosis Date   Acute MI, subendocardial (HCC)    Arthritis    Back pain 05/31/2013   Benign paroxysmal positional vertigo 08/05/2016   CAD (coronary artery disease)    a. NSTEMI 10/2011: LHC 11/19/11: pLAD 30%, oOM 60%, AVCFX 30%, CFX after OM2 30%, pRCA 60 and 70%, then 99%, AM 80-90% with TIMI 3 flow.  PCI: Promus DES x 2 to RCA; b. 06/2012 Cath: patent RCA stents w/ subtl occl of Acute Marginal (jailed)->Med rx; c. 05/2015 MV: EF 59%, mod mid infsept/inf/ap lat/ap infarct with peri-infarct isch-->Med Rx; d. 02/2016 Cath: LM nl, LAD 40m, RI 50, RCA patent stents.   Colitis    Facial skin lesion 01/28/2017   GERD (gastroesophageal reflux disease)    occasional   Hyperlipidemia    Hypertension    Hypertensive  heart disease    a. Echocardiogram 11/19/11: Difficult acoustic windows, EF 60-65%, normal LV wall thickness, grade 1 diastolic dysfunction   Hypoparathyroidism    Low zinc  level 11/26/2016   Multinodular thyroid     Goiter s/p thyroidectomy in 2007 with post-op  hypocalcemia and post-op hypothyroidism/hypoparathyroidism with hypocalcemia   Neck pain on right side 07/13/2013   Nocturia 05/31/2013   Osteoarthritis    Pain in right axilla 03/13/2016   Palpitations    a. 03/2012 - patient set up for event monitor but did not wear correctly -  she declined wearing a repeat monitor   Post-surgical hypothyroidism    Sun-damaged skin 12/05/2014   Tubular adenoma of colon 06/2011   Unspecified constipation 05/31/2013   Vertigo    Zinc  deficiency 11/26/2015    Past Surgical History:  Procedure Laterality Date   CARDIAC CATHETERIZATION N/A 03/08/2016   Procedure: Left Heart Cath and Coronary Angiography;  Surgeon: Candyce GORMAN Reek, MD;  Location: [facility] INVASIVE CV LAB;  Service: Cardiovascular;  Laterality: N/A;   COLONOSCOPY  Aenomatous polyps   07/05/2011   COLONOSCOPY N/A 09/28/2014   Procedure: COLONOSCOPY;  Surgeon: Gwendlyn ONEIDA Buddy, MD;  Location: [facility] ENDOSCOPY;  Service: Endoscopy;  Laterality: N/A;   CORONARY ANGIOPLASTY WITH STENT PLACEMENT     LEFT HEART CATH AND CORONARY ANGIOGRAPHY N/A 12/09/2018   Procedure: LEFT HEART CATH AND CORONARY ANGIOGRAPHY;  Surgeon: Reek Candyce GORMAN, MD;  Location: [facility] INVASIVE CV LAB;  Service: Cardiovascular;  Laterality: N/A;   LEFT HEART CATHETERIZATION WITH CORONARY ANGIOGRAM N/A 07/17/2012   Procedure: LEFT HEART CATHETERIZATION WITH CORONARY ANGIOGRAM;  Surgeon: Debby JONETTA Como, MD;  Location: [facility] CATH LAB;  Service: Cardiovascular;  Laterality: N/A;   PERCUTANEOUS CORONARY STENT INTERVENTION (PCI-S) N/A 11/19/2011   Procedure: PERCUTANEOUS CORONARY STENT INTERVENTION (PCI-S);  Surgeon: Debby JONETTA Como, MD;  Location: [facility] CATH LAB;  Service: Cardiovascular;   Laterality: N/A;   POLYPECTOMY     SHOULDER ARTHROSCOPY W/ ROTATOR CUFF REPAIR     right   TOTAL THYROIDECTOMY  2007   GOITER    Family History: Family History  Problem Relation Age of Onset   Colon cancer Brother    Cancer Brother        COLON   Hypertension Father    Heart disease Father    Heart attack Father    Blindness Sister    Congestive Heart Failure Sister    Hypertension Sister    Healthy Daughter    Healthy Son    Thyroid  disease Sister    Cancer Brother        multiple myelomas   Healthy Daughter    Healthy Daughter    Diabetes Neg Hx    Prostate cancer Neg Hx    Breast cancer Neg Hx    Esophageal cancer Neg Hx    Rectal cancer Neg Hx    Stomach cancer Neg Hx     Social History  reports that she has never smoked. She has never used smokeless tobacco. She reports that she does not currently use alcohol. She reports that she does not use drugs.  Allergies[1]  Medications  Current Medications[2]  Vitals   There were no vitals filed for this visit.  There is no height or weight on file to calculate BMI.   Physical Exam  General: Well-developed well-nourished woman in no acute distress HEENT: Normocephalic atraumatic Lungs: Clear Cardiovascular: Regular rate rhythm Neurological exam She is awake alert oriented x 3 No dysarthria No aphasia Cranial nerves: Pupils equal round reactive light, extract movements intact, visual fields full, facial sensation intact to light touch but subjectively feels numbing sensation on the left cheek and temple, facial symmetry preserved, tongue and palate midline. Motor examination with no drift in any of the 4 extremities Sensation intact to light touch without extinction Coordination: No ataxia  Labs/Imaging/Neurodiagnostic studies   CBC:  Recent Labs  Lab 2025-02-10 1356 February 10, 2025 1357  WBC  --  7.3  NEUTROABS  --  4.1  HGB 13.6 13.6  HCT 40.0 41.2  MCV  --  87.7  PLT  --  298   Basic Metabolic Panel:   Lab Results  Component Value Date   NA 141 10/12/2024   K 4.0 10/12/2024   CO2 30 10/12/2024   GLUCOSE 84 10/12/2024   BUN 23 10/12/2024   CREATININE 1.01 10/12/2024   CALCIUM  8.4 10/12/2024   GFRNONAA 43 (L) 08/04/2024   GFRAA 45 (L) 09/12/2020   Lipid Panel:  Lab Results  Component Value Date   LDLCALC 82 10/12/2024   HgbA1c:  Lab Results  Component Value Date   HGBA1C 5.9 10/12/2024   Alcohol Level     Component Value Date/Time   ETH <10 02/05/2023 1425   INR  Lab Results  Component Value Date   INR 0.9 02/05/2023   APTT  Lab Results  Component Value Date   APTT 27 02/05/2023   CT Head without contrast(Personally reviewed): No  bleed.  Aspects 10  Stat MRI Brain(Personally reviewed): Negative for acute process   ASSESSMENT   Maelee T Aplin is a 79 y.o. female with above past medical history who has now presented with sudden onset of left-sided facial numbing sensation.  She was seen for similar symptoms in 2024 but says that at that time she had more of left arm symptoms, when my documentation in the chart mentions facial symptoms only. She does have stroke risk factors but her exam revealed mild deficits and NIH stroke scale of at best 1. Too mild to treat with IV thrombolysis.  I wanted to make sure that we are not missing a stroke so we pursued a stat MRI which was completed and personally reviewed by me-negative  RECOMMENDATIONS  Stat MRI was completed and negative.  I doubt that this is a TIA.  I would reassess her in a little while and treat her with some IV fluids etc. to make sure she is not dehydrated and having the symptoms in the setting of dehydration and electrolyte abnormalities. Other than that, no further inpatient workup/treatment recommended at this time. Follow-up with outpatient neurology Please call neurology with questions as needed Plan discussed with Dr.  Darra. ______________________________________________________________________    Signed, Eligio Lav, MD [facility]     [1]  Allergies Allergen Reactions   Zetia  [Ezetimibe ] Other (See Comments)    Myalgia, paresthesias and weakness   Penicillin G Rash  [2]  Current Facility-Administered Medications:    sodium chloride  flush (NS) 0.9 % injection 3 mL, 3 mL, Intravenous, Once, Long, Joshua G, MD  Current Outpatient Medications:    amLODipine  (NORVASC ) 10 MG tablet, TAKE 1 TABLET BY MOUTH DAILY, Disp: 90 tablet, Rfl: 3   aspirin  EC 81 MG tablet, Take 81 mg by mouth daily., Disp: , Rfl:    B Complex-C (B-COMPLEX WITH VITAMIN C) tablet, Take 1 tablet by mouth daily., Disp: , Rfl:    Bempedoic Acid  (NEXLETOL ) 180 MG TABS, Take 1 tablet (180 mg total) by mouth daily., Disp: 30 tablet, Rfl: 11   bisoprolol  (ZEBETA ) 5 MG tablet, TAKE 1 TABLET BY MOUTH DAILY, Disp: 90 tablet, Rfl: 2   calcitRIOL  (ROCALTROL ) 0.25 MCG capsule, TAKE 3 CAPSULES BY MOUTH DAILY, Disp: 300 capsule, Rfl: 2   calcium  carbonate (TUMS) 500 MG chewable tablet, Chew 2 tablets (400 mg of elemental calcium  total) by mouth 2 (two) times daily., Disp: , Rfl:    ciclopirox  (PENLAC ) 8 % solution, Apply topically at bedtime. Apply over nail and surrounding skin. Apply daily over previous coat. After seven (7) days, may remove with alcohol and continue cycle., Disp: 6.6 mL, Rfl: 3   clopidogrel  (PLAVIX ) 75 MG tablet, TAKE 1 TABLET BY MOUTH DAILY, Disp: 90 tablet, Rfl: 3   Evolocumab  (REPATHA  SURECLICK) 140 MG/ML SOAJ, Inject 140 mg into the skin every 14 (fourteen) days., Disp: 6 mL, Rfl: 1   furosemide  (LASIX ) 20 MG tablet, TAKE 1 TABLET BY MOUTH ONCE  WEEKLY AS NEEDED, Disp: 12 tablet, Rfl: 3   hydrALAZINE  (APRESOLINE ) 10 MG tablet, TAKE 1/2 TABLET (5 MG) BY MOUTH AS NEEDED FOR BP ABOVE 160 OR GREATER, Disp: 30 tablet, Rfl: 6   hydrocortisone  (ANUSOL -HC) 2.5 % rectal cream, Place 1 Application rectally 2 (two)  times daily., Disp: 30 g, Rfl: 1   isosorbide  mononitrate (IMDUR ) 60 MG 24 hr tablet, Take 1 tablet (60 mg total) by mouth daily., Disp: 100 tablet, Rfl: 3   levothyroxine  (SYNTHROID ) 112 MCG tablet,  Take 1 tablet (112 mcg total) by mouth 3 (three) times a week., Disp: 36 tablet, Rfl: 1   Multiple Vitamin (MULTIVITAMIN WITH MINERALS) TABS tablet, Take 1 tablet by mouth daily., Disp: , Rfl:    nitroGLYCERIN  (NITROSTAT ) 0.4 MG SL tablet, DISSOLVE 1 TABLET UNDER THE  TONGUE EVERY 5 MINUTES AS NEEDED FOR CHEST PAIN. MAX OF 3 TABLETS IN 15 MINUTES. CALL 911 IF PAIN  PERSISTS., Disp: 100 tablet, Rfl: 3   pantoprazole  (PROTONIX ) 40 MG tablet, Take 1 tablet (40 mg total) by mouth daily., Disp: 90 tablet, Rfl: 1   rosuvastatin  (CRESTOR ) 20 MG tablet, Take 1 tablet (20 mg total) by mouth daily., Disp: 90 tablet, Rfl: 2   urea  (CARMOL) 10 % cream, Apply topically as needed., Disp: 71 g, Rfl: 0

## 2025-01-19 ENCOUNTER — Other Ambulatory Visit (HOSPITAL_COMMUNITY): Payer: Self-pay

## 2025-01-26 ENCOUNTER — Other Ambulatory Visit: Payer: Self-pay | Admitting: Internal Medicine

## 2025-01-28 ENCOUNTER — Ambulatory Visit (HOSPITAL_BASED_OUTPATIENT_CLINIC_OR_DEPARTMENT_OTHER)
Admission: RE | Admit: 2025-01-28 | Discharge: 2025-01-28 | Disposition: A | Source: Ambulatory Visit | Attending: Family Medicine | Admitting: Family Medicine

## 2025-01-28 ENCOUNTER — Encounter (HOSPITAL_BASED_OUTPATIENT_CLINIC_OR_DEPARTMENT_OTHER): Payer: Self-pay

## 2025-01-28 DIAGNOSIS — Z1231 Encounter for screening mammogram for malignant neoplasm of breast: Secondary | ICD-10-CM

## 2025-01-29 ENCOUNTER — Other Ambulatory Visit (HOSPITAL_COMMUNITY): Payer: Self-pay

## 2025-01-29 NOTE — Telephone Encounter (Signed)
 Pt had a lipid panel done on 10/12/2024 medication sent to pt's pharmacy as requested. Confirmation received.

## 2025-02-01 ENCOUNTER — Ambulatory Visit: Admitting: Family Medicine

## 2025-02-03 ENCOUNTER — Ambulatory Visit: Admitting: Student

## 2025-06-01 ENCOUNTER — Institutional Professional Consult (permissible substitution): Admitting: Neurology

## 2025-07-20 ENCOUNTER — Ambulatory Visit
